# Patient Record
Sex: Male | Born: 1945 | Race: White | Hispanic: No | Marital: Married | Smoking: Never smoker
Health system: Southern US, Community
[De-identification: ages and names within clinical notes are randomized; demographics above are authoritative.]

## PROBLEM LIST (undated history)

## (undated) DIAGNOSIS — I10 Essential (primary) hypertension: Secondary | ICD-10-CM

## (undated) DIAGNOSIS — Z7901 Long term (current) use of anticoagulants: Secondary | ICD-10-CM

## (undated) DIAGNOSIS — I351 Nonrheumatic aortic (valve) insufficiency: Secondary | ICD-10-CM

## (undated) DIAGNOSIS — K56609 Unspecified intestinal obstruction, unspecified as to partial versus complete obstruction: Secondary | ICD-10-CM

## (undated) DIAGNOSIS — Z8719 Personal history of other diseases of the digestive system: Secondary | ICD-10-CM

## (undated) DIAGNOSIS — I482 Chronic atrial fibrillation, unspecified: Secondary | ICD-10-CM

## (undated) DIAGNOSIS — N400 Enlarged prostate without lower urinary tract symptoms: Secondary | ICD-10-CM

## (undated) DIAGNOSIS — E785 Hyperlipidemia, unspecified: Secondary | ICD-10-CM

## (undated) DIAGNOSIS — I5189 Other ill-defined heart diseases: Secondary | ICD-10-CM

## (undated) DIAGNOSIS — G9341 Metabolic encephalopathy: Secondary | ICD-10-CM

## (undated) DIAGNOSIS — A419 Sepsis, unspecified organism: Secondary | ICD-10-CM

## (undated) DIAGNOSIS — S86812A Strain of other muscle(s) and tendon(s) at lower leg level, left leg, initial encounter: Secondary | ICD-10-CM

## (undated) HISTORY — DX: Nonrheumatic aortic (valve) insufficiency: I35.1

## (undated) HISTORY — DX: Long term (current) use of anticoagulants: Z79.01

## (undated) HISTORY — DX: Hyperlipidemia, unspecified: E78.5

## (undated) HISTORY — DX: Chronic atrial fibrillation, unspecified: I48.20

## (undated) HISTORY — DX: Benign prostatic hyperplasia without lower urinary tract symptoms: N40.0

## (undated) HISTORY — DX: Other ill-defined heart diseases: I51.89

## (undated) HISTORY — DX: Essential (primary) hypertension: I10

## (undated) HISTORY — DX: Personal history of other diseases of the digestive system: Z87.19

---
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
## 2002-11-11 HISTORY — PX: SMALL INTESTINE SURGERY: SHX150

## 2002-11-11 HISTORY — PX: APPENDECTOMY: SHX54

## 2004-05-01 ENCOUNTER — Ambulatory Visit (HOSPITAL_COMMUNITY): Admission: RE | Admit: 2004-05-01 | Discharge: 2004-05-01 | Payer: Self-pay | Admitting: Cardiology

## 2004-05-01 HISTORY — PX: CARDIOVERSION: SHX1299

## 2006-01-20 ENCOUNTER — Encounter: Admission: RE | Admit: 2006-01-20 | Discharge: 2006-01-20 | Payer: Self-pay | Admitting: Internal Medicine

## 2007-09-30 HISTORY — PX: CARDIOVASCULAR STRESS TEST: SHX262

## 2008-09-09 ENCOUNTER — Ambulatory Visit: Payer: Self-pay | Admitting: Cardiology

## 2008-09-09 ENCOUNTER — Encounter: Admission: RE | Admit: 2008-09-09 | Discharge: 2008-09-09 | Payer: Self-pay | Admitting: Cardiology

## 2008-09-09 ENCOUNTER — Inpatient Hospital Stay (HOSPITAL_COMMUNITY): Admission: EM | Admit: 2008-09-09 | Discharge: 2008-09-19 | Payer: Self-pay | Admitting: Emergency Medicine

## 2008-09-10 ENCOUNTER — Encounter (INDEPENDENT_AMBULATORY_CARE_PROVIDER_SITE_OTHER): Payer: Self-pay | Admitting: General Surgery

## 2008-09-10 ENCOUNTER — Encounter (INDEPENDENT_AMBULATORY_CARE_PROVIDER_SITE_OTHER): Payer: Self-pay | Admitting: *Deleted

## 2008-09-16 ENCOUNTER — Encounter (INDEPENDENT_AMBULATORY_CARE_PROVIDER_SITE_OTHER): Payer: Self-pay | Admitting: *Deleted

## 2008-09-29 ENCOUNTER — Inpatient Hospital Stay (HOSPITAL_COMMUNITY): Admission: EM | Admit: 2008-09-29 | Discharge: 2008-10-08 | Payer: Self-pay | Admitting: Emergency Medicine

## 2008-09-29 ENCOUNTER — Encounter: Admission: RE | Admit: 2008-09-29 | Discharge: 2008-09-29 | Payer: Self-pay | Admitting: General Surgery

## 2008-09-29 ENCOUNTER — Encounter (INDEPENDENT_AMBULATORY_CARE_PROVIDER_SITE_OTHER): Payer: Self-pay | Admitting: *Deleted

## 2008-11-17 ENCOUNTER — Encounter (INDEPENDENT_AMBULATORY_CARE_PROVIDER_SITE_OTHER): Payer: Self-pay | Admitting: *Deleted

## 2009-08-11 DIAGNOSIS — I5189 Other ill-defined heart diseases: Secondary | ICD-10-CM

## 2009-08-11 HISTORY — DX: Other ill-defined heart diseases: I51.89

## 2009-09-05 ENCOUNTER — Encounter: Payer: Self-pay | Admitting: Gastroenterology

## 2009-09-05 HISTORY — PX: US ECHOCARDIOGRAPHY: HXRAD669

## 2009-10-11 ENCOUNTER — Ambulatory Visit: Payer: Self-pay | Admitting: Gastroenterology

## 2009-10-11 ENCOUNTER — Encounter (INDEPENDENT_AMBULATORY_CARE_PROVIDER_SITE_OTHER): Payer: Self-pay | Admitting: *Deleted

## 2009-10-11 DIAGNOSIS — K5730 Diverticulosis of large intestine without perforation or abscess without bleeding: Secondary | ICD-10-CM | POA: Insufficient documentation

## 2009-10-11 DIAGNOSIS — K219 Gastro-esophageal reflux disease without esophagitis: Secondary | ICD-10-CM | POA: Insufficient documentation

## 2009-11-28 ENCOUNTER — Encounter (INDEPENDENT_AMBULATORY_CARE_PROVIDER_SITE_OTHER): Payer: Self-pay | Admitting: *Deleted

## 2009-11-30 ENCOUNTER — Ambulatory Visit: Payer: Self-pay | Admitting: Gastroenterology

## 2010-01-02 ENCOUNTER — Telehealth: Payer: Self-pay | Admitting: Gastroenterology

## 2010-01-03 ENCOUNTER — Ambulatory Visit: Payer: Self-pay | Admitting: Gastroenterology

## 2010-09-24 ENCOUNTER — Ambulatory Visit: Payer: Self-pay | Admitting: Cardiology

## 2010-09-28 ENCOUNTER — Ambulatory Visit: Payer: Self-pay | Admitting: Cardiology

## 2010-10-10 ENCOUNTER — Ambulatory Visit: Payer: Self-pay | Admitting: Cardiology

## 2010-10-16 ENCOUNTER — Ambulatory Visit: Payer: Self-pay | Admitting: Cardiovascular Disease

## 2010-11-16 ENCOUNTER — Ambulatory Visit: Payer: Self-pay | Admitting: Cardiology

## 2010-12-11 NOTE — Letter (Signed)
Summary: [facility] Instructions  [facility]  [address] [place], [place] [address]   Phone: [phone]  Fax: [phone]       Russell Dillon    [birth year]    MRN: [id number]        Procedure Day Dorna Bloom:  Lenor Coffin 12/14/09     Arrival Time:  8:30AM     Procedure Time:  9:30AM     Location of Procedure:                    _ X_  [facility] (4th Floor)                        PREPARATION FOR COLONOSCOPY WITH MOVIPREP   Starting 5 days prior to your procedure 12/09/09 do not eat nuts, seeds, popcorn, corn, beans, peas,  salads, or any raw vegetables.  Do not take any fiber supplements (e.g. Metamucil, Citrucel, and Benefiber).  THE DAY BEFORE YOUR PROCEDURE         DATE: 12/13/09  DAY: WEDNESDAY  1.  Drink clear liquids the entire day-NO SOLID FOOD  2.  Do not drink anything colored red or purple.  Avoid juices with pulp.  No orange juice.  3.  Drink at least 64 oz. (8 glasses) of fluid/clear liquids during the day to prevent dehydration and help the prep work efficiently.  CLEAR LIQUIDS INCLUDE: Water Jello Ice Popsicles Tea (sugar ok, no milk/cream) Powdered fruit flavored drinks Coffee (sugar ok, no milk/cream) Gatorade Juice: apple, white grape, white cranberry  Lemonade Clear bullion, consomm, broth Carbonated beverages (any kind) Strained chicken noodle soup Hard Candy                             4.  In the morning, mix first dose of MoviPrep solution:    Empty 1 Pouch A and 1 Pouch B into the disposable container    Add lukewarm drinking water to the top line of the container. Mix to dissolve    Refrigerate (mixed solution should be used within 24 hrs)  5.  Begin drinking the prep at 5:00 p.m. The MoviPrep container is divided by 4 marks.   Every 15 minutes drink the solution down to the next mark (approximately 8 oz) until the full liter is complete.   6.  Follow completed prep with 16 oz of clear liquid of your choice  (Nothing red or purple).  Continue to drink clear liquids until bedtime.  7.  Before going to bed, mix second dose of MoviPrep solution:    Empty 1 Pouch A and 1 Pouch B into the disposable container    Add lukewarm drinking water to the top line of the container. Mix to dissolve    Refrigerate  THE DAY OF YOUR PROCEDURE      DATE: 12/14/09  DAY: THURSDAY  Beginning at 4:30AM (5 hours before procedure):         1. Every 15 minutes, drink the solution down to the next mark (approx 8 oz) until the full liter is complete.  2. Follow completed prep with 16 oz. of clear liquid of your choice.    3. You may drink clear liquids until 7:30AM (2 HOURS BEFORE PROCEDURE).   MEDICATION INSTRUCTIONS  Unless otherwise instructed, you should take regular prescription medications with a small sip of water   as early as possible the morning of  your procedure.         OTHER INSTRUCTIONS  You will need a responsible adult at least 67 years of age to accompany you and drive you home.   This person must remain in the waiting room during your procedure.  Wear loose fitting clothing that is easily removed.  Leave jewelry and other valuables at home.  However, you may wish to bring a book to read or  an iPod/MP3 player to listen to music as you wait for your procedure to start.  Remove all body piercing jewelry and leave at home.  Total time from sign-in until discharge is approximately 2-3 hours.  You should go home directly after your procedure and rest.  You can resume normal activities the  day after your procedure.  The day of your procedure you should not:   Drive   Make legal decisions   Operate machinery   Drink alcohol   Return to work  You will receive specific instructions about eating, activities and medications before you leave.    The above instructions have been reviewed and explained to me by   Clide Cliff, RN_____________________    I fully understand  and can verbalize these instructions _____________________________ Date _________

## 2010-12-11 NOTE — Progress Notes (Signed)
Summary: Moviprep  Phone Note Call from Patient Call back at Home Phone [phone]   Caller: Patient Call For: Dr. Russella Dar Reason for Call: Talk to Nurse Summary of Call: Pt is needing his Moviprep called in for his procedure tomorrow. [facility] and [facility]'t [facility]. Initial call taken by: Karna Christmas,  January 02, 2010 8:04 AM  Follow-up for Phone Call        Rx was sent to pts pharmacy.  Follow-up by: Christie Nottingham CMA Duncan Dull),  January 02, 2010 8:09 AM    New/Updated Medications: MOVIPREP 100 GM  SOLR (PEG-KCL-NACL-NASULF-NA ASC-C) As per prep instructions. Prescriptions: MOVIPREP 100 GM  SOLR (PEG-KCL-NACL-NASULF-NA ASC-C) As per prep instructions.  #1 x 0   Entered by:   Christie Nottingham CMA (AAMA)   Authorized by:   Meryl Dare MD [facility]   Signed by:   Christie Nottingham CMA (AAMA) on 01/02/2010   Method used:   [facility]  [address] [id number]       [address]       [place], [place]  [address]       Ph: [phone] or [phone]       Fax: [phone]   RxID:   [id number]

## 2010-12-11 NOTE — Miscellaneous (Signed)
Summary: V76.51,V58.61,V72.81--NO PCR  Clinical Lists Changes  Observations: Added new observation of ALLERGY REV: Done (11/30/2009 9:51)   Patient already has his script.

## 2010-12-11 NOTE — Procedures (Signed)
Summary: Colonoscopy  Patient: Russell Dillon Note: All result statuses are Final unless otherwise noted.  Tests: (1) Colonoscopy (COL)   COL Colonoscopy           DONE     [facility]     [address]     [place], [place]  [address]           COLONOSCOPY PROCEDURE REPORT           PATIENT:  [name], [name]  MR#:  [id number]     BIRTHDATE:  Apr 14, 1946, 63 yrs. old  GENDER:  male           ENDOSCOPIST:  Judie Petit T. Russella Dar, MD, [facility]     Referred by:  Kari Baars, M.D.           PROCEDURE DATE:  01/03/2010     PROCEDURE:  Average-risk screening colonoscopy G0121     ASA CLASS:  Class III     INDICATIONS:  1) Routine Risk Screening           MEDICATIONS:   Fentanyl 50 mcg IV, Versed 6 mg IV           DESCRIPTION OF PROCEDURE:   After the risks benefits and     alternatives of the procedure were thoroughly explained, informed     consent was obtained.  Digital rectal exam was performed and     revealed no abnormalities.   The LB PCF-H180AL [id number] endoscope     was introduced through the anus and advanced to the cecum, which     was identified by both the appendix and ileocecal valve, limited     by a tortuous colon, fair prep.  The quality of the prep was     Moviprep fair only after extensive rinsing and suctioning. The     instrument was then slowly withdrawn as the colon was fully     examined.     <<PROCEDUREIMAGES>>           FINDINGS:  A normal appearing cecum, ileocecal valve, and     appendiceal orifice were identified. The ascending, hepatic     flexure, transverse, splenic flexure, descending, sigmoid colon,     and rectum appeared unremarkable. Retroflexed views in the rectum     revealed internal hemorrhoids, small. The time to cecum =  7.33     minutes. The scope was then withdrawn (time =  15.5  min) from the     patient and the procedure completed.           COMPLICATIONS:  None           ENDOSCOPIC IMPRESSION:     1) Normal colon     2)  Fair bowel prep           RECOMMENDATIONS:     1) High fiber diet with liberal fluid intake.     2) Repeat Colonoscopy in 5 years for CRC screeding with a more     extensive bowel  prep.           Venita Lick. Russella Dar, MD, [facility]           n.     eSIGNED:   Venita Lick. Jamielyn Petrucci at 01/03/2010 02:19 PM           [name], [name], [id number]  Note: An exclamation mark (!) indicates a result that was not dispersed into the flowsheet. Document Creation Date: 01/03/2010 2:19 PM _______________________________________________________________________  (1)  Order result status: Final Collection or observation date-time: 01/03/2010 14:13 Requested date-time:  Receipt date-time:  Reported date-time:  Referring Physician:   Ordering Physician: Claudette Head [phone]) Specimen Source:  Source: Launa Grill Order Number: [id number] Lab site:   Appended Document: Colonoscopy    Clinical Lists Changes  Observations: Added new observation of COLONNXTDUE: 12/2014 (01/03/2010 15:33)      Appended Document: Colonoscopy     Procedures Next Due Date:    Colonoscopy: 12/2014

## 2010-12-13 ENCOUNTER — Encounter (INDEPENDENT_AMBULATORY_CARE_PROVIDER_SITE_OTHER): Payer: Medicare Other

## 2010-12-13 DIAGNOSIS — Z7901 Long term (current) use of anticoagulants: Secondary | ICD-10-CM

## 2011-01-10 ENCOUNTER — Other Ambulatory Visit (INDEPENDENT_AMBULATORY_CARE_PROVIDER_SITE_OTHER): Payer: Medicare Other

## 2011-01-10 DIAGNOSIS — I4891 Unspecified atrial fibrillation: Secondary | ICD-10-CM

## 2011-01-10 DIAGNOSIS — Z7901 Long term (current) use of anticoagulants: Secondary | ICD-10-CM

## 2011-01-22 ENCOUNTER — Encounter (INDEPENDENT_AMBULATORY_CARE_PROVIDER_SITE_OTHER): Payer: Medicare Other

## 2011-01-22 DIAGNOSIS — I4891 Unspecified atrial fibrillation: Secondary | ICD-10-CM

## 2011-01-22 DIAGNOSIS — Z7901 Long term (current) use of anticoagulants: Secondary | ICD-10-CM

## 2011-02-19 ENCOUNTER — Encounter: Payer: Medicare Other | Admitting: *Deleted

## 2011-02-25 ENCOUNTER — Other Ambulatory Visit: Payer: Self-pay | Admitting: Cardiology

## 2011-03-01 ENCOUNTER — Ambulatory Visit (INDEPENDENT_AMBULATORY_CARE_PROVIDER_SITE_OTHER): Payer: Medicare Other | Admitting: *Deleted

## 2011-03-01 DIAGNOSIS — I482 Chronic atrial fibrillation, unspecified: Secondary | ICD-10-CM | POA: Insufficient documentation

## 2011-03-01 DIAGNOSIS — I4891 Unspecified atrial fibrillation: Secondary | ICD-10-CM

## 2011-03-01 DIAGNOSIS — Z7901 Long term (current) use of anticoagulants: Secondary | ICD-10-CM | POA: Insufficient documentation

## 2011-03-01 LAB — POCT INR: INR: 2

## 2011-03-26 NOTE — H&P (Signed)
NAME:  Russell Dillon, Russell Dillon NO.:  [id number]   MEDICAL RECORD NO.:  [id number]          PATIENT TYPE:  INP   LOCATION:  5118                         FACILITY:  [facility]   PHYSICIAN:  Sharlet Salina T. Hoxworth, M.D.DATE OF BIRTH:  [birth year]   DATE OF ADMISSION:  09/09/2008  DATE OF DISCHARGE:                              HISTORY & PHYSICAL   PRIMARY CARE PHYSICIAN:  Deirdre Peer. Polite, MD   CARDIOLOGIST:  Colleen Can. Deborah Chalk, MD   TIME OF ADMISSION:  17:10 p.m.   CHIEF COMPLAINT:  Abdominal distention/abdominal pain.   HISTORY OF PRESENT ILLNESS:  Russell Dillon is a 65 year old white male  who has a history of hypertension, atrial fibrillation on chronic  Coumadin, BPH, and hypercholesterolemia with a history of congestive  heart failure, who began to have nausea with abdominal pain, and cough  approximately mid week last week.  At this time, he also has had some  associated diarrhea without any blood.  At this time, he visited his  primary care physician, who felt that he may have a GI virus and  informed him to drink lots of fluids and this would more than likely  pass.  The patient went home and over the next several days, this  persisted, and he followed up again with his primary care physician this  past Monday, who still felt this was a virus.  The patient went home and  at that time had a normal bowel movement once again without any blood or  any abnormalities.  At this time, the patient was having intermittent  just aggravating kind of a dull naggy pain.  Also, he was still having  some nausea.  The patient then states that he had another normal bowel  movement on Thursday.  However, since this pain was continuing to  persist, he felt due to his cardiac history that he was to see his  cardiologist to rule out the possibility of any cardiac complications.  Dr. Deborah Chalk was unavailable, so, he saw his assistant who set him for a  CT scan of the abdomen and pelvis.  The  CT scan showed a distal small  bowel obstruction with findings suspicious for pneumatosis of the small  bowel.  There is a transition point in the right lower quadrant.  At  this time, there was no free air seen.  At this time, after these  results, the patient was sent to [facility] Emergency Department where  at this time, we were consulted to see the patient.   REVIEW OF SYSTEMS:  Please see HPI.  Otherwise, the patient denies any  fevers, chills, headaches, chest pain, or shortness of breath.  He does  admit to abdominal pain with some nausea.  Otherwise, all other systems  are currently negative.   FAMILY HISTORY:  Noncontributory.   PAST MEDICAL HISTORY:  Significant for:  1. Atrial fibrillation, on chronic Coumadin.  2. Hypertension.  3. Hypercholesterolemia.  4. BPH.  5. History of CHF.   PAST SURGICAL HISTORY:  None.   SOCIAL HISTORY:  The patient is married.  He does have 1 daughter who  is  a Runner, broadcasting/film/video at [employer] [place].  Otherwise, he does not smoke or  drink any alcohol.   ALLERGIES:  NKDA.   MEDICATIONS:  Include, however, doses are not known:  1. Coumadin.  2. Flomax.  3. Lasix.  4. Cardizem.  5. Digitek.  6. Lipitor.   PHYSICAL EXAMINATION:  GENERAL:  Russell Dillon is a 65 year old white  male who is currently lying in bed in no acute distress and does not  appear ill.  VITAL SIGNS:  Temperature 97, pulse 104, respirations 20, and blood  pressure 101/65.  HEENT:  Eyes, sclerae noninjected.  Pupils equal, round, and reactive to  light.  Ears and nose are without any obvious rashes or lesions.  No  rhinorrhea.  Mouth is pink and moist.  Throat shows no exudate.  NECK:  Supple.  Trachea is midline.  No thyromegaly.  HEART:  Currently, the patient is in atrial fibrillation and is somewhat  tachycardic with a pulse rate of 104.  Otherwise, I did not note any  murmurs, gallops, or rubs.  He does have +2 bilateral carotid, radial,  and pedal pulses.   LUNGS:  Clear to auscultation bilaterally with no wheezes, rhonchi, or  rales noted.  Respiratory effort is nonlabored.  ABDOMEN:  Soft.  However, distended and somewhat tight when palpated  deeper.  Currently, he has decreased bowel sounds. However, he does not  seem tender with palpation.  Currently, he does not have any other  masses or hernias noted and no peritoneal signs.  MUSCULOSKELETAL:  All 4 extremities are symmetrical with no cyanosis,  clubbing, or edema.  SKIN:  Warm and dry.  NEUROLOGIC:  Cranial nerves II through XII appeared to be grossly  intact.  PSYCH:  The patient is awake, alert, and oriented x3 with an appropriate  affect.   LABORATORY DATA AND DIAGNOSTICS:  White blood cell count is 12,600,  hemoglobin 13.6, hematocrit 40.8, and platelet count 361,000.  INR is  3.6.  Sodium 129, potassium 3.6, glucose 129, BUN 27, creatinine 1.16.  LFTs are all normal.  CT scan is as dictated above, but shows distal  small bowel obstruction with transition point in the right lower  quadrant with findings suspicious for pneumatosis of the small bowel.  An EKG also shows the patient currently in atrial fibrillation.  An  acute abdominal series was also obtained.  It does have a chest x-ray  associated with it with no acute cardiopulmonary issues.   IMPRESSION:  1. Small bowel obstruction.  2. Questionable small bowel pneumatosis.  3. Atrial fibrillation.  4. Supratherapeutic INR.  5. Hypertension.  6. Hypercholesterolemia.  7. Benign prostatic hypertrophy.  8. History of congestive heart failure.   PLAN:  At this time, we will plan on admitting this patient, starting  him on Cipro, Flagyl, as well as various other p.r.n. medications as  needed for pain and nausea.  The patient will also be started on IV  fluids.  We will also get his type and cross and began transfusion of 2  units of FFP to correct the patient's INR.  Once this is complete, then  we will get a stat PT,  INR and result should be called to the on-call  physician.  At this time, it is concerning since the patient has no  surgical history that he is currently in the condition he is in, and  anticipate unless he resolved this problem overnight that he will  probably need surgical intervention  in the morning.  However, in the  meantime we will try to correct his INR and repeat labs in the morning  as well as the KUB early in the morning.  Otherwise, in the meantime we  will place an NG tube and place him on low intermittent wall suction to  help decompress his small bowel.  Also, I am going to hold all of his  home medicines as right now he will be n.p.o., and he is somewhat  hypotensive currently.  Therefore, I did not want to give him anymore of  his blood pressure medicines.  Otherwise, we will follow this patient  closely and recheck in the morning.      Letha Cape, PA      Lorne Skeens. Hoxworth, M.D.  Electronically Signed    KEO/MEDQ  D:  09/09/2008  T:  09/10/2008  Job:  [id number]   cc:   Colleen Can. Deborah Chalk, M.D.  Deirdre Peer. Polite, M.D.

## 2011-03-26 NOTE — H&P (Signed)
NAME:  Russell Dillon, Russell Dillon NO.:  [id number]   MEDICAL RECORD NO.:  [id number]          PATIENT TYPE:  INP   LOCATION:  5155                         FACILITY:  [facility]   PHYSICIAN:  Velora Heckler, MD      DATE OF BIRTH:  [birth year]   DATE OF ADMISSION:  09/29/2008  DATE OF DISCHARGE:                              HISTORY & PHYSICAL   PRIMARY SURGEON:  Sharlet Salina T. Hoxworth, MD   CARDIOLOGIST:  Colleen Can. Deborah Chalk, MD   CHIEF COMPLAINT:  Failure to thrive and small-bowel obstruction seen on  x-ray.   HISTORY OF PRESENT ILLNESS:  Mr. Gentile is a 62-year male patient  who prior to the last admission on September 09, 2008, had not had any  operative procedures to the abdominal region.  He was admitted at that  time secondary to bowel obstruction.  He was taken to the OR on September 10, 2008, where he underwent an exploratory laparotomy, lysis of  adhesions, and incidental appendectomy by Dr. Johna Sheriff.  The patient  remained in the hospital until September 17, 2008.  He was discharged home  and reports he did better initially but had persistent anorexia with  progressive decline thereafter.  He continued to have multiple BMs daily  up to 3-4 per day which were loose in nature.  He reports his last BM  was this morning, 2 very tiny ones.  He has had emesis x1 about 1 week  ago.  He is on Coumadin for atrial fibrillation and yesterday, his INR  was 7.0.  Cardiology is managing this.  They held his Coumadin and give  him a dose of oral vitamin K.  He has not had any problems related to  bleeding related to the elevated INR per his recollection.  He is not  having abdominal pain and currently feels stable other than the anorexia  and inability to eat.  He only ate few bites of grits and had some  orange juice this morning.  He presented to Dr. Carolynne Edouard this morning for  evaluation because of the anorexia.  Dr. Carolynne Edouard examined him and felt the  patient needs further diagnostic workup.   A plain abdominal film was  taken, 2 views, which showed air-fluid levels and a diffuse small bowel  obstruction.  He was subsequently sent to the ER for admission and lab  work is pending.   REVIEW OF SYSTEMS:  ENT:  Persistent laryngitis.  The patient attributes  this to repeated NG tube insertion attempts in emergency department.  He  relates this to onset of his meningitis.  Otherwise, review of system  categories are negative or noncontributory or as per the history of  present illness.  GI:  The patient did report to me that he had  continued to take Imodium because of his loose stool problem.   SOCIAL HISTORY:  The patient is married.  No alcohol.  No drugs.   FAMILY MEDICAL HISTORY:  Noncontributory.   PAST MEDICAL HISTORY:  1. Atrial fibrillation, on chronic Coumadin.  2. Hypertension.  3. Dyslipidemia.  4. BPH.  5. History of  CHF, ejection fraction unknown.   PAST SURGICAL HISTORY:  Exploratory laparotomy with lysis of adhesions  and incidental appendectomy on September 10, 2008, by Dr. Johna Sheriff.   ALLERGIES:  NKDA.   CURRENT MEDICATIONS:  At time of discharge are as follows:  1. Lasix 20 mg daily.  2. Finasteride 5 mg daily.  3. Lipitor 10 mg daily.  4. Digoxin 0.25 mg daily.  5. Warfarin as directed by Cardiology.  6. Cardizem 360 mg daily.  7. Vicodin 5/325 one to two tablets every 4 hours as needed for pain.  8. Omeprazole 20 mg daily while taking over-the-counter ibuprofen.  9. Phenergan 25 mg half to whole tablet every 4 hours as needed for      nausea.  10.Over-the-counter Imodium as needed for loose stools.   PHYSICAL EXAMINATION:  GENERAL:  Pale, chronically ill-appearing male  patient, fair looking, complaining of anorexia.  VITAL SIGNS:  Temperature 97.1, BP 103/69, pulse 84 and regular, and  respirations 16.  PSYCH:  The patient is alert and oriented x3.  Affect is appropriate for  current situation.  NEURO:  Cranial nerves II through XII are grossly  intact.  He is moving  all extremities x4.  No focal deficits.  EARS, NOSE, AND THROAT:  Ears are symmetrical.  No otorrhea.  Nose is  midline.  No rhinorrhea.  Throat is dry.  Oral mucous membranes are dry.  He has a hoarse quality to the tone of his voice.  CHEST:  Bilateral lung sounds are clear to auscultation.  Respiratory  effort is nonlabored.  He is saturating 84% on room air.  He is  nontachypneic and denies respiratory difficulty.  He does not appear to  be in respiratory distress.  CARDIOVASCULAR:  Heart sounds are S1 and S2 without obvious rubs,  murmurs, or gallops.  No JVD.  No peripheral edema,.  Current pulse is  regular and nontachycardic.  ABDOMEN:  Soft and slightly distended.  Bowel sounds are auscultated  mainly in left quadrant with left upper quadrant with echoic and rushing  sounds.  Abdomen is nontender.  Midline wound is open distally.  Dressing is in place.  This was removed and there is pale granular  tissue with serous drainage on the dressing.  EXTREMITIES:  Thin and symmetrical in appearance without cyanosis.   LABORATORY DATA:  CBC, CMP, urinalysis, PT/PTT, and lactic acid are all  pending.   DIAGNOSTICS:  An outpatient abdominal x-ray, 2 views, again just shows  diffuse dilated loops of small bowel consistent with an obstructive  process.   IMPRESSION:  1. Small-bowel obstruction in patient with recent exploratory      laparotomy and lysis of adhesions.  2. Atrial fibrillation, on Coumadin, now is supratherapeutic INR per      yesterday's results.  3. Mild respiratory failure in a patient with history of congestive      heart failure, rule out congestive heart failure exacerbation      versus pneumonia process.  4. Hypertension.  5. Dyslipidemia.  6. Benign prostatic hypertrophy.   PLAN:  1. Admit the patient, with 100 unit n.p.o. status for bowel rest, IV      fluids.  2. Follow up on labs.  We will place an NG tube once the INR is less       than or equal to 3.0.  3. If serum creatinine is okay, plan a CT of the abdomen and pelvis      with oral and IV contrast  once the NG tube is inserted and the      patient has been to low wall suction to decompress for 2 hours.  4. I will hold his home medications.  We will change his digoxin to IV      route and in lieu of p.o. Cardizem, we will use p.r.n. IV Lopressor      based on tachycardia.  Current systolic blood pressures in the      100s, so this does not need to be scheduled at this time.  He needs      hydration as well unless he is in heart failure.  In the interim,      we will begin his fluids at 150 an hour.  5. Check a chest x-ray and EKG.  Repeat pulse oximetry reading shows      95%.  We will also check a BNP.  6. Notify Cardiology of the patient's admission.      Allison L. Rennis Harding, N.P.      Velora Heckler, MD  Electronically Signed    ALE/MEDQ  D:  09/29/2008  T:  09/30/2008  Job:  [id number]   cc:   Colleen Can. Deborah Chalk, M.D.

## 2011-03-26 NOTE — Discharge Summary (Signed)
NAME:  Russell Dillon, Russell Dillon NO.:  [id number]   MEDICAL RECORD NO.:  [id number]          PATIENT TYPE:  INP   LOCATION:  2032                         FACILITY:  [facility]   PHYSICIAN:  Lennie Muckle, MD      DATE OF BIRTH:  03-Jan-1946   DATE OF ADMISSION:  09/09/2008  DATE OF DISCHARGE:                               DISCHARGE SUMMARY   ADDENDUM   On Friday 6, 2009, which was postoperative day 6, Russell Dillon was  doing well without any complaints.  His diet was advanced to regular  diet.  Also on this day, he had approximately 3 large bowel movements.  He had the anticipation that he will be discharged the next day; however  on the following day, he began to have approximately 7-8 diarrhea-type  bowel movements.  At this time, there was a concern that if the patient  was sent home he would end up being dehydrated.  Therefore at this time,  he was kept for additional couple of days and started on Imodium.  By  postoperative day 9, the patient's stools were improving, and he only  had approximately 3 stools on this day; however, they were still watery.  However, it was felt that his stools were better controlled with  Imodium, and therefore, he could be sent home with the Imodium.   Also on postoperative day 6, the patient began to have some serous  leakage out of his inferior aspect of his wound and this was opened up  slightly on postoperative day 7 and packing was started.  Therefore, at  this time home health was consulted to help the patient and his family  with dressing changes at home upon discharge.  Also on postoperative day  9 before the patient was discharged, his incision staples were  discontinued and Steri-Strips were applied.   DISCHARGE MEDICATIONS:  1. Frusemide 20 mg daily.  2. Finasteride 5 mg daily.  3. Lipitor 10 mg daily.  4. Digoxin 0.25 mg daily.  5. Warfarin half tab, 2 days a week.  6. Warfarin one tab 5 days a week.  7. Cardizem 360 mg p.o.  daily.  8. Vicodin 5/325 one-two tablets q.4 h. p.r.n. for pain.  9. Omeprazole 20 mg p.o. daily while taking over-the-counter      Ibuprofen.  10.Phenergan 25 mg half to whole tablet p.o. q.4 h. p.r.n. nausea.  11.Imodium over-the-counter as needed for loose stools.   DISCHARGE INSTRUCTIONS:  Discharge instructions are essentially the same  as prior to discharge summary; however, he is now informed that he no  longer needs to follow up with [facility] on September 20, 2008, for  staple removal.  He is now told to follow up with Dr. Johna Sheriff in  approximately 2 weeks for postoperative followup, and they are to call  to schedule his appointment.  Also, he is informed that they will need  to begin doing wound dressing changes.  He does have home health set up  for this, and they will help instruct the wife on how to do these  dressing changes, so that she  can eventually be able to use herself.  Also currently, the patient is complaining of not having a tremendous  appetite so he is informed to take some kind of supplemental shake such  as the [employer] or Ensure 3 times a day until his  appetite becomes a little more normal.  Also of note, the patient is  complaining of being hoarse, which started after his NG tube was  discontinued.  Per Dr. Freida Busman if the  patient is still having problems with this at his followup in  approximately 2 weeks, the patient may need an ENT consult.  Just a side  note, when the patient had his NG tube inserted, it took well over 6 or  7 trials, and I am not sure if this is contributing to some of his  hoarseness.      Letha Cape, PA      Lennie Muckle, MD  Electronically Signed    KEO/MEDQ  D:  09/19/2008  T:  09/19/2008  Job:  [id number]   cc:   Deirdre Peer. Polite, M.D.  Colleen Can. Deborah Chalk, M.D.  Lorne Skeens. Hoxworth, M.D.

## 2011-03-26 NOTE — Discharge Summary (Signed)
NAME:  Russell Dillon, Russell Dillon NO.:  [id number]   MEDICAL RECORD NO.:  [id number]          PATIENT TYPE:  INP   LOCATION:  2032                         FACILITY:  [facility]   PHYSICIAN:  Ollen Gross. Vernell Morgans, M.D. DATE OF BIRTH:  [birth year]   DATE OF ADMISSION:  09/09/2008  DATE OF DISCHARGE:  09/17/2008                               DISCHARGE SUMMARY   ADMITTING PHYSICIAN:  Sharlet Salina T. Hoxworth, MD   Date of anticipated discharge, September 17, 2008.  Anticipated discharge  physician will either be Dr. Bertram Savin or Dr. Cyndia Bent.   CONSULTATIONS:  Colleen Can. Deborah Chalk, MD with Cardiology.   PROCEDURES:  Laparotomy with lysis of adhesions for small bowel  obstruction with incidental appendectomy by Dr. Johna Sheriff on September 10, 2008.   REASON FOR ADMISSION:  Russell Dillon is a 65 year old white male with a  history of hypertension, paroxysmal atrial fibrillation, BPH, and  hypercholesterolemia, who presented to the [facility] Emergency  Department with nausea and abdominal pain for approximately a week and a  half.  It was felt that he had a gastrointestinal virus.  However, as  his pain continued to persist, eventually he presented to the emergency  department.  At this time, he was found by CT scan to have a distal  small bowel obstruction with findings suspicious for pneumatosis of the  small bowel.  At this time, the patient was admitted.  Please see  admitting history and physical for further details.   ADMITTING DIAGNOSES:  1. Small bowel obstruction.  2. Questionable small bowel pneumatosis.  3. Paroxysmal atrial fibrillation.  4. Supratherapeutic INR.  5. Hypertension.  6. Hypercholesterolemia.  7. Benign prostatic hypertrophy.  8. History of congestive heart failure.   HOSPITAL COURSE:  The patient was admitted to the hospital and started  on Cipro and Flagyl as well as given 2 units of FFP to correct the  patient's INR, which was supratherapeutic at 3.6.   An NG tube was also  placed to help decompress the patient's small bowel.  At this time, he  was allowed to rest overnight.  On the following morning, the patient  has had over 2000 mL out of his NG tube.  The patient was feeling  better, however, his KUB still showed a small bowel obstruction.  At  this time, his INR was 2.5 and therefore 2 more units of FFP were given.  Later in the day, the patient was taken to the operating room, where  laparotomy with lysis of adhesions and an incidental appendectomy were  performed by Dr. Johna Sheriff.  The patient tolerated this procedure well.  However, he was sent to a step-down unit due to his atrial fibrillation.  At this time, a Cardiology consult was obtained, in which the patient  was started on diltiazem as well as Cardizem, I believe.  Over the next  several days, the patient began to improve.  He did not have much pain.  However, he did develop a postoperative ileus.  By postoperative day #2,  the patient was stable enough for transfer out of the step-down unit to  a regular telemetry floor.  By postoperative day #4, the patient began  to pass flatus, and at this time, his NG tube was discontinued.  At this  time, his diet was advanced, and at the time of dictation, which is  postoperative day #6, he was tolerating a full liquid diet, and at this  time, his diet was advanced to a regular diet with anticipation of  discharge home in the morning, September 17, 2008.   Also in the hospital, the patient did come in in atrial fibrillation  and, as stated earlier, Cardiology was consulted.  At this time, he was  started on diltiazem, and for rate control as needed he was placed on  Cardizem.  The patient was stable, in atrial fibrillation for most of  the hospital stay until postoperative day #4, he converted back to  normal sinus rhythm.  Also on postoperative #4, the patient was found to  have tremendously supratherapeutic INR of 7.0.  At this  time, his  Lovenox was held, and over the next several days this began to fall, and  by postoperative #6, the patient's INR was back down to 1.4.   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  An x-ray was obtained to make sure  that this was all that was going on.  Physical Therapy was consulted to  help with the patient's range of motion as well as strength with  walking.  By the time of discharge, the patient is able to ambulate  without a walker as well as walk up and down steps and have almost full  range of motion in his right knee.   DISCHARGE DIAGNOSES:  1. Small bowel obstruction secondary to adhesions.  2. Status post laparotomy with lysis of adhesions and incidental      appendectomy.  3. Paroxysmal atrial fibrillation.  4. Supratherapeutic INR, which has been corrected.  5. Hypertension.  6. Hypercholesterolemia.  7. Benign prostatic hypertrophy.  8. History of congestive heart failure.   DISCHARGE MEDICATIONS:  He may resume all home medications, which  include:  1. Furosemide 20 mg daily.  2. Finasteride 5 mg daily.  3. Lipitor 10 mg daily.  4. Cardizem 360 mg daily.  5. Digoxin 0.25 mg daily.  6. Warfarin half a tab 2 days.  7. Warfarin 1 tablet for the other 5 days.  8. He was also given a prescription for Vicodin 5/325 one to two      tablets q.4 h. p.r.n. pain.   DISCHARGE INSTRUCTIONS:  Russell Dillon is informed that he does not  have any dietary restrictions.  He may increase his activity slowly and  he may walk up steps.  He was also informed that he may shower, however  he is not to bathe for the next 2-3 weeks.  He was also not to do any  heavy lifting greater than 10 or 15 pounds for the next 6 weeks.  He may  begin driving after 1 to 2 weeks or no longer using any narcotic pain  medicines.  Also of note, on postoperative day #6, the patient began to  have some serous fluid drainage out of his  incision, and the patient is  informed that he may use gauze to cover this up so that he does not have  any leakage of his clothes.  Otherwise, he is to call our office for  worsening abdominal pain, pus-like drainage from his incision, or  fever  greater than 101.5.  He is to also follow up in the [facility] on  September 20, 2008, at 3:15 for staple removal.  After that, he will then  follow up with Dr. Johna Sheriff within the next 2-3 weeks.  Otherwise, he is  to return to see Dr.  Deborah Chalk at his office early next week for lab work to check the  patient's PT and INR.  As stated earlier, this is a discharge summary  for Mr. Tousley with the anticipation of his discharge being  tomorrow.  If anything changes, an addendum will be made at that time.      Letha Cape, PA      Ollen Gross. Vernell Morgans, M.D.  Electronically Signed    KEO/MEDQ  D:  09/16/2008  T:  09/16/2008  Job:  [id number]   cc:   Colleen Can. Deborah Chalk, M.D.  Deirdre Peer. Polite, M.D.

## 2011-03-26 NOTE — Op Note (Signed)
NAME:  Russell Dillon, Russell Dillon NO.:  [id number]   MEDICAL RECORD NO.:  [id number]          PATIENT TYPE:  INP   LOCATION:  2550                         FACILITY:  [facility]   PHYSICIAN:  Sharlet Salina T. Hoxworth, M.D.DATE OF BIRTH:  1946/04/28   DATE OF PROCEDURE:  09/10/2008  DATE OF DISCHARGE:                               OPERATIVE REPORT   PREOPERATIVE DIAGNOSIS:  Small bowel obstruction.   POSTOPERATIVE DIAGNOSIS:  Small bowel obstruction secondary to multiple  adhesions.   SURGICAL PROCEDURES:  Laparotomy and lysis of adhesions for small bowel  obstruction with incidental appendectomy.   SURGEON:  Lorne Skeens. Hoxworth, MD   ASSISTANT:  Almond Lint, MD   ANESTHESIA:  General.   BRIEF HISTORY:  Russell Dillon is a 65 year old male with no previous  history of abdominal problems or surgery who presents with 1 week of  abdominal distention, nausea, vomiting, and pain.  He has had a CT scan  showing a small bowel obstruction with markedly dilated loops of  proximal small bowel and decompressed distal small bowel as well as what  appears to be possibly pneumatosis in the wall of the small bowel.  He  has been admitted, rehydrated, and NG placed.  He was on Coumadin for  chronic atrial fibrillation, which has been reversed and followup KUB  today shows persistent markedly dilated loops of small bowel.  The CT  scan also suggested some swirling of the distal small bowel mesentery  possibly internal hernia or volvulus.  With these findings, I have  recommended proceeding with emergency laparotomy for diagnosis and  treatment.  The nature of procedure, indications, risks of bleeding,  infection, bowel injury, and cardiorespiratory complications were  discussed with the patient's family.  He was now brought to operating  room for this procedure.   DESCRIPTION OF OPERATION:  The patient was brought to the operating  room, placed in supine position on the operating table, and  general  orotracheal anesthesia was induced.  He was already on broad-spectrum  antibiotics.  The abdomen was widely sterilely prepped and draped.  Foley catheter was placed.  Correct patient and procedure were verified.  PAS were in place.  A midline incision in the lower abdomen skirting up  above the umbilicus was used and the incision carried down through the  midline fascia and the peritoneum entered under direct vision.  There  was a moderate evidence of straw-colored ascitic fluid consistent with  obstruction and this was suctioned.  There were noted to be multiple  dilated loops of proximal small bowel.  They were otherwise healthy and  viable.  We followed this proximal small bowel distally and at about the  mid ileum, it became very tethered toward the retroperitoneum and to the  base of the mesentery.  These appeared to be chronic fairly severe  adhesions, not unlike what would be expected in a postoperative state.  The more terminal distal ileum was then again free, but still dilated  and then there were again marked adhesions and tethering posteriorly the  bowels toward the terminal ileum and the cecum.  These areas clearly  appeared to be responsible for the obstruction.  We proceeded with a  fairly extensive adhesiolysis.  Many of these adhesions appeared to be  essentially folds of the mesenteric peritoneum down upon itself and onto  the bowel and these were carefully lysed with the cautery and sharp  dissection until the initially mid ileum was completely freed to its  mobile mesentery, and we went down and completely freed the terminal  ileum, which was quite knotted down around the right lower quadrant and  the last foot or so of the terminal ileum was completely decompressed  beyond the adhesions.  The terminal was completely freed up down the  ileocecal valve, which was clearly identified.  The cecum mobilized up  in the cecum and right colon appeared normal.  There  was some fluid and  gas in the colon consistent with a high-grade partial small bowel  obstruction.  The appendix was somewhat rudimentarily small structure,  which was adherent over to the mesentery and the terminal ileum creating  a potential site for internal hernia, although I do not believe this was  involved in the original process.  However, because of this, we went  ahead and did an incidental appendectomy inverting the stump with 2-0  silk.  The small bowel was then carefully run from the ileocecal valve  back to the ligament of Treitz and was completely free on mobile  mesentery.  Noted on the small bowel areas that looked like creeping fat  as one might see with Crohn's disease with separate areas of mesenteric  fat extending up over the sides toward the antimesenteric border of the  small bowel.  However, the small bowel itself did not show any signs of  thickening or inflammation as one would expect with Crohn's disease.  The markedly dilated small bowel contents were then milked proximally  back and easily passed into the stomach and decompressed via the NG tube  and a large amount of air and about 1200 mL of fluid was decompressed  with very nice decompression of the small bowel.  The abdomen was then  copiously irrigated with warm saline.  The bowel again inspected.  There  was certainly no evidence of bowel injury and certainly no evidence of  any bowel ischemia or damage from the obstruction.  The sigmoid colon  was noted to be redundant and had some peritoneal attachments over to  actually the right anterior abdominal wall, but this again was not  involved in any way in the small bowel obstruction.  This was left  intact.  After thorough irrigation and assurance of hemostasis, this was  returned anatomic position.  A piece of Seprafilm was left over the  small bowel loops under the wound as it was minimal omentum.  The  midline fascia was then closed with running #1 PDS  began either end of  the incision and tied centrally.  The subcutaneous tissue was irrigated.  Skin closed with staples.  Sponge and needle counts were correct.  The  patient was taken to recovery in stable condition, although he did have  some SVT controlled with Cardizem and labetalol during the case.      Lorne Skeens. Hoxworth, M.D.  Electronically Signed     BTH/MEDQ  D:  09/10/2008  T:  09/11/2008  Job:  [id number]

## 2011-03-26 NOTE — Consult Note (Signed)
NAME:  ABDIMALIK, Russell Dillon NO.:  [id number]   MEDICAL RECORD NO.:  [id number]          PATIENT TYPE:  INP   LOCATION:  5155                         FACILITY:  [facility]   PHYSICIAN:  Zola Button T. Lazarus Dillon, M.D. DATE OF BIRTH:  09-20-1946   DATE OF CONSULTATION:  09/30/2008  DATE OF DISCHARGE:                                 CONSULTATION   CHIEF COMPLAINT:  Hoarseness.   HISTORY:  A 65 year old white male hospitalized 3 weeks ago with small  bowel obstruction and discovered at exploratory laparotomy to have  adhesions, etiology unknown.  Upon entrance into the emergency room, he  had 5 or 6 attempts at placing a nasogastric tube causing pain and nasal  bleeding.  Finally on the floor, one of the nurses was successful in  getting an NG tube placed.  This was in place for several days.  During  that interval, he had endotracheal anesthesia for surgery, but the tube  was removed upon completion of surgery and he was not on the ventilator  except during surgery.  Within 1 or 2 days after removal of the  nasogastric tube, his voice became hoarse to the point of aphonia and  has remained this way ever since.  It has not fluctuated.  No breathing  difficulty.  No swallowing difficulty or obvious aspiration.  No pain.  No cough.  He is back in the hospital now with another episode of small  bowel obstruction, presumed related to adhesions and maybe on deck for  surgery on Monday in 3 days.  The patient has no known neurologic  issues.  He does have atrial fibrillation, which is apparently rate  controlled.   PHYSICAL EXAMINATION:  GENERAL:  This is a somewhat gaunt-appearing  middle-aged white male in no obvious distress.  Mental status seems  sharp and he is reasonably animated.  HEENT:  The head is atraumatic and neck supple.  Cranial nerves grossly  intact.  His voice is completely aphonic and he is unable to make a  noise even with shouting.  The cough is more, but below and  relatively  nonfunctional.  He is not attempting to clear his throat.  Ear canals  are clear with normal aerated drums.  Anterior nose is slightly  corrugated and narrow on both sides.  Oral cavity is moist with teeth in  good repair.  Oropharynx shows a slightly long soft palate with 1+  residual tonsils.  NECK:  Without adenopathy.  No palpable thyroid gland including masses.   Following 2% viscous Xylocaine to the nose, the flexible laryngoscope  was introduced through the left side.  Nasopharynx is clear with normal  eustachian tori.  Oropharynx is clear with normal base of tongue.  Hypopharynx reveals a fully mobile and normal right vocal cord.  The  left arytenoid looks to be in a normal position and queer slightly, but  does not abduct or adduct.  The vocal cord appears to be in a paramedian  position, but is heavily bowed especially immediately in front of the  vocal process of the arytenoid.  No obvious scar tissue.  The  airway is  good, but he is unable to reapproximate the cords to phonate.  No  pooling, valleculae or pyriforms.   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  I am not sure, I think this could be  posttraumatic including difficult NG tube placement.  The severity of  the bowing almost suggests a soft tissue deficit such as could occur  with scarring, but I do not why this would have occurred only several  days after discontinuation of the NG tube.   PLAN:  I would like to get a CT scan with contrast from the base of  skull down to the mediastinum looking for any lesion along the course of  the recurrent laryngeal nerve.  I will discuss this with the patient and  his family.  Absence in the etiology, I am tempted to do a Gelfoam  injection under anesthesia, perhaps coordinated with the general  surgeons.  The ability to inject this material may suggest whether this  is truly softer bowing related to  denervation, atrophy or whether this  is actual scar tissue.  The Gelfoam would be admittedly temporary and  depending on his  results, may be appropriate to consider for a calcium hydroxyapatite  injection later, or possibly even a vocal cord medialization thyroplasty  from an external approach.  In almost any case, I think there will be  some value in having speech pathology assist.      Russell Dillon, M.D.  Electronically Signed     KTW/MEDQ  D:  09/30/2008  T:  10/01/2008  Job:  [id number]   cc:   Lorne Skeens. Hoxworth, M.D.  Velora Heckler, MD

## 2011-03-26 NOTE — Consult Note (Signed)
NAME:  COSME, JACOB NO.:  [id number]   MEDICAL RECORD NO.:  [id number]          PATIENT TYPE:  INP   LOCATION:  3312                         FACILITY:  [facility]   PHYSICIAN:  Marca Ancona, MD      DATE OF BIRTH:  04-Oct-1946   DATE OF CONSULTATION:  09/10/2008  DATE OF DISCHARGE:                                 CONSULTATION   PRIMARY CARDIOLOGIST:  Colleen Can. Deborah Chalk, MD   HISTORY OF PRESENT ILLNESS:  This is a 65 year old with a history of  hypertension and paroxysmal atrial fibrillation who presented on September 09, 2008, with nausea and abdominal pain.  The patient has had  approximately 10-day history of nausea and abdominal pain with some  diarrhea prior to admission.  He saw his primary care physician on  Monday who thought he had gastroenteritis.  He continued to have  abdominal pain.  He only had 2 bowel movements in the last 4 to 5 days  with a last normal bowel movement being on Thursday.  He was seen in Dr.  Ronnald Nian office and set up for a CT of his abdomen.  The CT showed a  distal small bowel obstruction.  The patient did go for an exploratory  laparotomy today, which showed small bowel adhesions.  He did have lysis  of adhesions.  Per the patient, he has paroxysmal atrial fibrillation,  and he is mostly in sinus rhythm.  However, his admission EKG showed  atrial fibrillation.  In the operating room today, his heart rate went  up to the 140s with what looked like atrial flutter with 2:1 block.  Cardiology is consulted for rate control.   HOME MEDICATIONS:  1. Coumadin.  2. Flomax.  3. Lasix 20 mg daily.  4. Diltiazem CD 360 mg daily.  5. Digoxin 0.125 mg daily.  6. Lipitor 10 mg daily.   CURRENT MEDICATIONS IN THE HOSPITAL:  1. Cipro 100 mg IV b.i.d.  2. Metronidazole 500 mg IV t.i.d.  3. Morphine by PCA.   PAST MEDICAL HISTORY:  1. Atrial fibrillation.  This appears to be paroxysmal.  He did have a      failed cardioversion back in 2005  but apparently he stays in normal      sinus rhythm most of the time per his own report.  The patient is      on chronic Coumadin.  2. Hypertension.  3. Hypercholesterolemia.  4. BPH.  5. History of diastolic congestive heart failure in the setting of      atrial fibrillation with rapid ventricular response.  6. History of normal adenosine Cardiolite in 2005.   SOCIAL HISTORY:  The patient is married.  He has 1 daughter.  He does  not smoke, does not drink alcohol, or does not use illicit drugs.   LABORATORY DATA:  Today, INR 2.1, white count 10.6, hematocrit 35,  platelets 349, sodium 135, potassium 3.1, chloride 96, bicarb 28, BUN  23, creatinine 1.01, and glucose 107.  LFTs normal.  CT of the abdomen  and pelvis showed distal small bowel obstruction.  There was a small  amount of free pelvic fluid.   FAMILY HISTORY:  Mother had a myocardial infarction at the age of 38.   REVIEW OF SYSTEMS:  Negative except as noted in the history of present  illness.   PHYSICAL EXAMINATION:  VITAL SIGNS:  The patient is afebrile, 95% on  room air, blood pressure 126/63, heart rate currently is in the 110s and  irregular.  GENERAL:  This is a well-developed male.  He is drowsy postoperatively.  NEUROLOGICAL:  He is drowsy due to medication.  However, he is oriented.  Normal affect.  NECK:  No thyromegaly or thyroid nodule.  JVP 8 cm.  LUNGS:  Clear to auscultation bilaterally.  Normal respiratory effort.  CARDIOVASCULAR:  Heart irregular.  S1 and S2.  No S3.  No S4.  No  murmur.  There is no peripheral edema.  There is no carotid bruit.  ABDOMEN:  The patient is status post expiratory laparotomy.  There is  mild distention.  EXTREMITIES:  No clubbing or cyanosis.  HEENT:  Normal exam.  MUSCULOSKELETAL:  Normal exam.  SKIN:  Normal exam.   EKG at admission showed atrial fibrillation at rate of 107.  Telemetry  from the operating room shows atrial flutter in the 140s with 2:1 block.   Currently, he is in atrial flutter to 110 with variable block.   ASSESSMENT AND PLAN:  This is a 65 year old with a history of paroxysmal  atrial fibrillation.  He was admitted for small bowel obstruction.  He  is now status post an exploratory laparotomy with lysis of adhesions.  The patient was in atrial fibrillation at admission and then was noted  to have atrial flutter with 2:1 block and a heart rate in the 140s in  the operating room.  Cardiology was asked to evaluate.  1. Atrial fibrillation/flutter.  The patient is currently in atrial      flutter in the 110s after IV diltiazem boluses.  We will start him      on diltiazem drip which we will titrate from 5 to 15 mg an hour      with goal heart rate less than 90.  We will replete his potassium      which is low at 3.1.  When he is taking p.o., we will resume his      digoxin, and we will resume his Coumadin when he is okay per      Surgery.  He has no history of stroke, so I do not believe there is      a need for a heparin bridge.  2. Small bowel obstruction.  This is secondary to adhesions.  He has      had no prior history of surgery, so I am not sure what the cause of      his adhesions are.      Marca Ancona, MD  Electronically Signed     DM/MEDQ  D:  09/10/2008  T:  09/11/2008  Job:  [id number]

## 2011-03-29 ENCOUNTER — Encounter: Payer: Medicare Other | Admitting: *Deleted

## 2011-03-29 ENCOUNTER — Ambulatory Visit (INDEPENDENT_AMBULATORY_CARE_PROVIDER_SITE_OTHER): Payer: Medicare Other | Admitting: *Deleted

## 2011-03-29 DIAGNOSIS — Z7901 Long term (current) use of anticoagulants: Secondary | ICD-10-CM

## 2011-03-29 DIAGNOSIS — I4891 Unspecified atrial fibrillation: Secondary | ICD-10-CM

## 2011-03-29 LAB — POCT INR: INR: 2.3

## 2011-03-29 NOTE — Cardiovascular Report (Signed)
NAME:  Russell Dillon, WORD NO.:  [id number]   MEDICAL RECORD NO.:  [id number]                   PATIENT TYPE:  OIB   LOCATION:  2899                                 FACILITY:  [facility]   PHYSICIAN:  Colleen Can. Deborah Chalk, M.D.            DATE OF BIRTH:  09/06/1946   DATE OF PROCEDURE:  05/01/2004  DATE OF DISCHARGE:  05/01/2004                              CARDIAC CATHETERIZATION   PROCEDURE:  Cardioversion.   CARDIOLOGIST:  Colleen Can. Deborah Chalk, M.D.   DESCRIPTION OF PROCEDURE:  Using anterior posterior pads, the patient  received two separate shocks of 200 joules each of biphasic energy.  The  first failed to convert him to sinus rhythm.  The second shock did convert  him to sinus rhythm.  However, before he was entirely awake he had  recurrence of atrial fibrillation.   He received 375 mg of IV Pentothal by Dr. Sheldon Silvan.   OVERALL IMPRESSION:  Initial successful cardioversion but with subsequent  reversion to atrial fibrillation.                                               Colleen Can. Deborah Chalk, M.D.    SNT/MEDQ  D:  05/01/2004  T:  05/02/2004  Job:  [id number]   cc:   Ike Bene, M.D.  [address]  [place]  [place] [address]  Fax: [phone]
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
## 2011-03-29 NOTE — Discharge Summary (Signed)
NAME:  Russell Dillon, Russell Dillon NO.:  [id number]   MEDICAL RECORD NO.:  [id number]          PATIENT TYPE:  INP   LOCATION:  5155                         FACILITY:  [facility]   PHYSICIAN:  Gabrielle Dare. Janee Morn, M.D.DATE OF BIRTH:  19-Dec-1945   DATE OF ADMISSION:  09/29/2008  DATE OF DISCHARGE:  10/08/2008                               DISCHARGE SUMMARY   CHIEF COMPLAINT:  Small bowel obstruction.   HISTORY OF PRESENT ILLNESS:  Russell Dillon is a 65 year old patient who  was admitted in October 2009 for bowel obstruction.  He underwent lysis  of adhesions on September 10, 2008, with incidental appendectomy.  He was  discharged home, but developed persistent anorexia and progressive  decline, so he was admitted back to the hospital with recurrent bowel  obstruction on September 29, 2008.   HOSPITAL COURSE:  The patient was treated with NG tube and bowel rest.  Nutrition was given with TNA.  He remained hemodynamically stable.  He  was having significant hoarseness and he was seen by Dr. 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  CT scan was checked by Dr.  Lazarus Salines.  He felt he was okay to discharge without further intervention.  His postoperative bowel obstruction gradually resolved.  He takes  Coumadin at home that was held until it was determined that he would not  be needing further surgery on his abdomen.  His Coumadin was restarted.  He continued having bowel movements and tolerating p.o. since TNA was  stopped.  He was covered with Lovenox as well as Coumadin was restarted  and he achieved the goal on his Coumadin for discharge, and he was  discharged on October 08, 2008, with complete resumption of bowel  function.  His INR was 1.8.  He was discharged in stable condition.  Followup is with Dr. Deborah Chalk to check his INR.  He will also follow up  with Dr. Johna Sheriff in 2 weeks and Dr. Lazarus Salines from ENT for his vocal cord  problem in 2-3 weeks.   DISCHARGE DIET:   Cardiac.   DISCHARGE MEDICATIONS:  1. Finasteride 5 mg daily.  2. Lipitor 10 mg daily.  3. Digoxin 0.25 mg daily.  4. Coumadin alternating 2.5 mg and 5 mg with further management by Dr.      Deborah Chalk.  5. He is also to Cardizem 360 mg daily.  6. Phenergan 25 mg p.r.n.  7. In addition, he is to take MiraLax over the counter daily as      needed.  8. Tylenol as needed for pain.      Gabrielle Dare Janee Morn, M.D.  Electronically Signed     BET/MEDQ  D:  11/17/2008  T:  11/18/2008  Job:  [id number]   cc:   Lorne Skeens. Hoxworth, M.D.  Colleen Can. Deborah Chalk, M.D.  Gloris Manchester. Lazarus Salines, M.D.

## 2011-04-12 ENCOUNTER — Telehealth: Payer: Self-pay | Admitting: Cardiology

## 2011-04-12 NOTE — Telephone Encounter (Signed)
Called regarding his voicemail that he left earlier today (next Dr. referral and scheduling an appointment). Please call back.

## 2011-04-15 NOTE — Telephone Encounter (Signed)
Pt requesting to reschedule appointment for 04/25/11.  RN rescheduled appointment with pt for 06/03/11 at 8 am with Norma Fredrickson NP.

## 2011-04-19 ENCOUNTER — Telehealth: Payer: Self-pay | Admitting: Cardiology

## 2011-04-19 NOTE — Telephone Encounter (Signed)
Pt wanted to talk to you He said he found a tick and removed it but now he has a rash please call

## 2011-04-19 NOTE — Telephone Encounter (Signed)
Found a tick recently and now has developed a rash.  RN instructed pt to call PCP Dr. Clelia Croft for recommendations.  Pt will call Dr. Clelia Croft today.

## 2011-04-25 ENCOUNTER — Ambulatory Visit (INDEPENDENT_AMBULATORY_CARE_PROVIDER_SITE_OTHER): Payer: Medicare Other | Admitting: *Deleted

## 2011-04-25 ENCOUNTER — Ambulatory Visit: Payer: Medicare Other | Admitting: Cardiology

## 2011-04-25 DIAGNOSIS — I4891 Unspecified atrial fibrillation: Secondary | ICD-10-CM

## 2011-04-25 DIAGNOSIS — Z7901 Long term (current) use of anticoagulants: Secondary | ICD-10-CM

## 2011-04-25 LAB — POCT INR: INR: 2.3

## 2011-04-26 ENCOUNTER — Encounter: Payer: Medicare Other | Admitting: *Deleted

## 2011-05-30 ENCOUNTER — Encounter: Payer: Self-pay | Admitting: Nurse Practitioner

## 2011-06-03 ENCOUNTER — Ambulatory Visit: Payer: Medicare Other | Admitting: Nurse Practitioner

## 2011-06-03 ENCOUNTER — Encounter: Payer: Medicare Other | Admitting: *Deleted

## 2011-06-04 ENCOUNTER — Encounter: Payer: Self-pay | Admitting: Nurse Practitioner

## 2011-06-04 ENCOUNTER — Ambulatory Visit (INDEPENDENT_AMBULATORY_CARE_PROVIDER_SITE_OTHER): Payer: Medicare Other | Admitting: *Deleted

## 2011-06-04 ENCOUNTER — Ambulatory Visit (INDEPENDENT_AMBULATORY_CARE_PROVIDER_SITE_OTHER): Payer: Medicare Other | Admitting: Nurse Practitioner

## 2011-06-04 VITALS — BP 138/82 | HR 56 | Ht 74.0 in | Wt 193.0 lb

## 2011-06-04 DIAGNOSIS — Z7901 Long term (current) use of anticoagulants: Secondary | ICD-10-CM

## 2011-06-04 DIAGNOSIS — I4891 Unspecified atrial fibrillation: Secondary | ICD-10-CM

## 2011-06-04 DIAGNOSIS — I10 Essential (primary) hypertension: Secondary | ICD-10-CM

## 2011-06-04 MED ORDER — DIGOXIN 250 MCG PO TABS: 250.0000 ug | ORAL_TABLET | Freq: Every day | ORAL | Status: DC

## 2011-06-04 MED ORDER — LIPITOR 10 MG PO TABS: 10.0000 mg | ORAL_TABLET | Freq: Every day | ORAL | Status: DC

## 2011-06-04 MED ORDER — FINASTERIDE 5 MG PO TABS: 5.0000 mg | ORAL_TABLET | Freq: Every day | ORAL | Status: DC

## 2011-06-04 NOTE — Patient Instructions (Signed)
Stay on your current medicines Continue to check your coumadin levels monthly You will see Dr. Marca Ancona in 6 months. Call for any problems.

## 2011-06-04 NOTE — Assessment & Plan Note (Addendum)
His rate is controlled. He remains asymptomatic. He is not having any problems with his coumadin. Last echo was in 2010. He is tolerating his medicines. We will see him back in 6 months. Patient is agreeable to this plan and will call if any problems develop in the interim.

## 2011-06-04 NOTE — Assessment & Plan Note (Signed)
Blood pressure is ok. No changes in his medicines.

## 2011-06-04 NOTE — Progress Notes (Signed)
    Russell Dillon Date of Birth: 1946/05/13   History of Present Illness: Russell Dillon is seen back today for his 6 month Dillon. He is seen for Dr. Shirlee Latch. He is a former patient of Dr. Ronnald Nian. He has chronic atrial fib and is on coumadin anticoagulation. He continues to do well from our standpoint. No chest pain or shortness of breath. He is not lightheaded or dizzy. He is tolerating his medicines. No problems with excessive bruising or bleeding. He sees Dr. Clelia Croft for primary care. He has just returned from the beach and had a good time.   Current Outpatient Prescriptions on File Prior to Dillon  Medication Sig Dispense Refill  . diltiazem (CARDIZEM CD) 300 MG 24 hr capsule Take 300 mg by mouth daily.        Marland Kitchen warfarin (COUMADIN) 5 MG tablet Take 5 mg by mouth as directed.        Marland Kitchen DISCONTD: digoxin (LANOXIN) 0.25 MG tablet Take 250 mcg by mouth daily.        Marland Kitchen DISCONTD: finasteride (PROSCAR) 5 MG tablet Take 5 mg by mouth daily.        Marland Kitchen DISCONTD: LIPITOR 10 MG tablet TAKE ONE TABLET BY MOUTH EVERY DAY  90 each  3    No Known Allergies  Past Medical History  Diagnosis Date  . Chronic atrial fibrillation   . Hypertension   . Chronic anticoagulation   . Hyperlipidemia   . BPH (benign prostatic hypertrophy)   . Hx SBO   . Diastolic dysfunction October 2010    Normal LV systolic function    Past Surgical History  Procedure Date  . Cardioversion 05/01/2004  . US echocardiography 09/05/2009    ef 55-60%  . Cardiovascular stress test 09/30/2007    EF 67%  No ischemia.     History  Smoking status  . Never Smoker   Smokeless tobacco  . Never Used    History  Alcohol Use No    Family History  Problem Relation Age of Onset  . Heart attack Mother   . Hypertension Mother   . Diabetes Father   . Heart disease Father     Review of Systems: The review of systems is as above.  All other systems were reviewed and are negative.  Physical Exam: BP 138/82  Pulse 56  Ht 6\' 2"   (1.88 m)  Wt 193 lb (87.544 kg)  BMI 24.78 kg/m2 Patient is very pleasant and in no acute distress. Skin is warm and dry. Color is normal.  HEENT is unremarkable. Normocephalic/atraumatic. PERRL. Sclera are nonicteric. Neck is supple. No masses. No JVD. Lungs are clear. Cardiac exam shows an irregular rhythm. Rate is controlled. Abdomen is soft. Extremities are without edema. Gait and ROM are intact. No gross neurologic deficits noted.  LABORATORY DATA: INR is pending.  Assessment / Plan:

## 2011-06-05 ENCOUNTER — Encounter (INDEPENDENT_AMBULATORY_CARE_PROVIDER_SITE_OTHER): Payer: Medicare Other | Admitting: *Deleted

## 2011-06-05 DIAGNOSIS — I4891 Unspecified atrial fibrillation: Secondary | ICD-10-CM

## 2011-06-05 DIAGNOSIS — Z7901 Long term (current) use of anticoagulants: Secondary | ICD-10-CM

## 2011-06-09 NOTE — Progress Notes (Signed)
Agree with note.  Russell Dillon  

## 2011-07-02 ENCOUNTER — Ambulatory Visit (INDEPENDENT_AMBULATORY_CARE_PROVIDER_SITE_OTHER): Payer: Medicare Other | Admitting: *Deleted

## 2011-07-02 DIAGNOSIS — Z7901 Long term (current) use of anticoagulants: Secondary | ICD-10-CM

## 2011-07-02 DIAGNOSIS — I4891 Unspecified atrial fibrillation: Secondary | ICD-10-CM

## 2011-07-30 ENCOUNTER — Ambulatory Visit (INDEPENDENT_AMBULATORY_CARE_PROVIDER_SITE_OTHER): Payer: Medicare Other | Admitting: *Deleted

## 2011-07-30 DIAGNOSIS — Z7901 Long term (current) use of anticoagulants: Secondary | ICD-10-CM

## 2011-07-30 DIAGNOSIS — I4891 Unspecified atrial fibrillation: Secondary | ICD-10-CM

## 2011-08-13 LAB — GLUCOSE, CAPILLARY
Glucose-Capillary: 103 — ABNORMAL HIGH
Glucose-Capillary: 105 — ABNORMAL HIGH
Glucose-Capillary: 117 — ABNORMAL HIGH
Glucose-Capillary: 123 — ABNORMAL HIGH
Glucose-Capillary: 123 — ABNORMAL HIGH
Glucose-Capillary: 124 — ABNORMAL HIGH
Glucose-Capillary: 130 — ABNORMAL HIGH
Glucose-Capillary: 139 — ABNORMAL HIGH
Glucose-Capillary: 143 — ABNORMAL HIGH
Glucose-Capillary: 97

## 2011-08-13 LAB — CBC
HCT: 35 — ABNORMAL LOW
HCT: 27.5 — ABNORMAL LOW
HCT: 28 — ABNORMAL LOW
HCT: 32 — ABNORMAL LOW
HCT: 32.1 — ABNORMAL LOW
HCT: 34.2 — ABNORMAL LOW
HCT: 34.4 — ABNORMAL LOW
HCT: 36.8 — ABNORMAL LOW
Hemoglobin: 13.6
Hemoglobin: 10.7 — ABNORMAL LOW
Hemoglobin: 10.8 — ABNORMAL LOW
Hemoglobin: 11.6 — ABNORMAL LOW
Hemoglobin: 9.3 — ABNORMAL LOW
Hemoglobin: 9.3 — ABNORMAL LOW
MCHC: 33.4
MCHC: 32.8
MCHC: 34
MCHC: 34
MCHC: 34.1
MCV: 85.8
MCV: 84.4
MCV: 85
MCV: 85.2
MCV: 86.3
MCV: 86.6
Platelets: 349
Platelets: 361
Platelets: 196
Platelets: 197
Platelets: 242
Platelets: 260
Platelets: 320
Platelets: 353
Platelets: 382
RBC: 3.26 — ABNORMAL LOW
RBC: 3.89 — ABNORMAL LOW
RBC: 3.94 — ABNORMAL LOW
RBC: 3.97 — ABNORMAL LOW
RDW: 13.8
RDW: 14.3
RDW: 14.6
RDW: 14.7
RDW: 15.2
RDW: 15.2
RDW: 15.7 — ABNORMAL HIGH
RDW: 15.7 — ABNORMAL HIGH
RDW: 16.1 — ABNORMAL HIGH
RDW: 16.2 — ABNORMAL HIGH
WBC: 10.2
WBC: 12.6 — ABNORMAL HIGH
WBC: 11.5 — ABNORMAL HIGH
WBC: 3.5 — ABNORMAL LOW
WBC: 4.6
WBC: 9.7

## 2011-08-13 LAB — DIFFERENTIAL
Basophils Absolute: 0.1
Basophils Relative: 1
Eosinophils Absolute: 0
Eosinophils Absolute: 0.1
Eosinophils Absolute: 0.2
Eosinophils Relative: 0
Eosinophils Relative: 3
Lymphs Abs: 0.8
Lymphs Abs: 1.1
Monocytes Relative: 13 — ABNORMAL HIGH
Monocytes Relative: 14 — ABNORMAL HIGH
Neutro Abs: 2.7
Neutrophils Relative %: 58

## 2011-08-13 LAB — BASIC METABOLIC PANEL
BUN: 23
BUN: 12
BUN: 16
BUN: 20
CO2: 23
CO2: 26
CO2: 28
Calcium: 7.8 — ABNORMAL LOW
Calcium: 8 — ABNORMAL LOW
Calcium: 8.7
Chloride: 101
Chloride: 104
Chloride: 106
Chloride: 109
Creatinine, Ser: 0.63
Creatinine, Ser: 0.96
GFR calc Af Amer: >60
GFR calc non Af Amer: >60
GFR calc non Af Amer: >60
GFR calc non Af Amer: >60
Glucose, Bld: 104 — ABNORMAL HIGH
Glucose, Bld: 109 — ABNORMAL HIGH
Glucose, Bld: 116 — ABNORMAL HIGH
Glucose, Bld: 94
Glucose, Bld: 98
Potassium: 3.1 — ABNORMAL LOW
Potassium: 3.8
Potassium: 3.8
Potassium: 4.4
Sodium: 135
Sodium: 135
Sodium: 136
Sodium: 137
Sodium: 140

## 2011-08-13 LAB — COMPREHENSIVE METABOLIC PANEL
ALT: 10
ALT: 11
ALT: 12
AST: 25
AST: 13
AST: 16
Albumin: 3.3 — ABNORMAL LOW
Albumin: 2.6 — ABNORMAL LOW
Albumin: 2.7 — ABNORMAL LOW
Alkaline Phosphatase: 80
BUN: 27 — ABNORMAL HIGH
BUN: 8
BUN: 9
CO2: 27
CO2: 25
Calcium: 7.9 — ABNORMAL LOW
Calcium: 8 — ABNORMAL LOW
Chloride: 105
Chloride: 106
Creatinine, Ser: 0.66
Creatinine, Ser: 0.69
GFR calc Af Amer: >60
GFR calc Af Amer: >60
GFR calc Af Amer: >60
GFR calc non Af Amer: >60
GFR calc non Af Amer: >60
Glucose, Bld: 109 — ABNORMAL HIGH
Glucose, Bld: 116 — ABNORMAL HIGH
Glucose, Bld: 148 — ABNORMAL HIGH
Potassium: 3.3 — ABNORMAL LOW
Sodium: 129 — ABNORMAL LOW
Sodium: 135
Sodium: 135
Total Bilirubin: 0.8
Total Bilirubin: 0.8
Total Bilirubin: 1
Total Protein: 5.9 — ABNORMAL LOW
Total Protein: 6.2

## 2011-08-13 LAB — PROTIME-INR
INR: 2.1 — ABNORMAL HIGH
INR: 2.4 — ABNORMAL HIGH
INR: 1.1
INR: 2 — ABNORMAL HIGH
INR: 2 — ABNORMAL HIGH
INR: 3.4 — ABNORMAL HIGH
INR: 6.5
Prothrombin Time: 25 — ABNORMAL HIGH
Prothrombin Time: 14.4
Prothrombin Time: 14.5
Prothrombin Time: 15.7 — ABNORMAL HIGH
Prothrombin Time: 17.8 — ABNORMAL HIGH
Prothrombin Time: 21.4 — ABNORMAL HIGH
Prothrombin Time: 23.7 — ABNORMAL HIGH
Prothrombin Time: 24.3 — ABNORMAL HIGH
Prothrombin Time: 36.8 — ABNORMAL HIGH
Prothrombin Time: 68 — ABNORMAL HIGH

## 2011-08-13 LAB — CULTURE, BLOOD (ROUTINE X 2)

## 2011-08-13 LAB — ABO/RH: ABO/RH(D): A POS

## 2011-08-13 LAB — CROSSMATCH

## 2011-08-13 LAB — PHOSPHORUS: Phosphorus: 3.2

## 2011-08-13 LAB — LIPID PANEL
Total CHOL/HDL Ratio: 2
VLDL: 10

## 2011-08-13 LAB — MAGNESIUM
Magnesium: 1.6
Magnesium: 1.8
Magnesium: 2.1

## 2011-08-13 LAB — PREPARE FRESH FROZEN PLASMA

## 2011-08-13 LAB — PREALBUMIN
Prealbumin: 8.9 — ABNORMAL LOW
Prealbumin: 9.1 — ABNORMAL LOW

## 2011-08-13 LAB — HEPATIC FUNCTION PANEL
AST: 14
Albumin: 2.3 — ABNORMAL LOW
Total Bilirubin: 0.8
Total Protein: 5.8 — ABNORMAL LOW

## 2011-08-13 LAB — CLOSTRIDIUM DIFFICILE EIA: C difficile Toxins A+B, EIA: NEGATIVE

## 2011-08-13 LAB — LIPASE, BLOOD: Lipase: 23

## 2011-08-27 ENCOUNTER — Ambulatory Visit (INDEPENDENT_AMBULATORY_CARE_PROVIDER_SITE_OTHER): Payer: Medicare Other | Admitting: *Deleted

## 2011-08-27 DIAGNOSIS — I4891 Unspecified atrial fibrillation: Secondary | ICD-10-CM

## 2011-08-27 DIAGNOSIS — Z7901 Long term (current) use of anticoagulants: Secondary | ICD-10-CM

## 2011-09-17 ENCOUNTER — Ambulatory Visit (INDEPENDENT_AMBULATORY_CARE_PROVIDER_SITE_OTHER): Payer: Medicare Other | Admitting: *Deleted

## 2011-09-17 DIAGNOSIS — I4891 Unspecified atrial fibrillation: Secondary | ICD-10-CM

## 2011-09-17 DIAGNOSIS — Z7901 Long term (current) use of anticoagulants: Secondary | ICD-10-CM

## 2011-09-17 LAB — POCT INR: INR: 2.6

## 2011-10-15 ENCOUNTER — Ambulatory Visit (INDEPENDENT_AMBULATORY_CARE_PROVIDER_SITE_OTHER): Payer: Medicare Other | Admitting: *Deleted

## 2011-10-15 DIAGNOSIS — I4891 Unspecified atrial fibrillation: Secondary | ICD-10-CM

## 2011-10-15 DIAGNOSIS — Z7901 Long term (current) use of anticoagulants: Secondary | ICD-10-CM

## 2011-10-15 LAB — POCT INR: INR: 2.2

## 2011-11-13 ENCOUNTER — Encounter: Payer: Medicare Other | Admitting: *Deleted

## 2011-11-18 ENCOUNTER — Ambulatory Visit (INDEPENDENT_AMBULATORY_CARE_PROVIDER_SITE_OTHER): Payer: Medicare Other | Admitting: *Deleted

## 2011-11-18 DIAGNOSIS — I4891 Unspecified atrial fibrillation: Secondary | ICD-10-CM

## 2011-11-18 DIAGNOSIS — Z7901 Long term (current) use of anticoagulants: Secondary | ICD-10-CM | POA: Diagnosis not present

## 2011-11-18 LAB — POCT INR: INR: 1.9

## 2011-12-18 DIAGNOSIS — L570 Actinic keratosis: Secondary | ICD-10-CM | POA: Diagnosis not present

## 2011-12-18 DIAGNOSIS — D239 Other benign neoplasm of skin, unspecified: Secondary | ICD-10-CM | POA: Diagnosis not present

## 2011-12-19 ENCOUNTER — Ambulatory Visit (INDEPENDENT_AMBULATORY_CARE_PROVIDER_SITE_OTHER): Payer: Medicare Other | Admitting: Cardiology

## 2011-12-19 ENCOUNTER — Encounter: Payer: Self-pay | Admitting: Cardiology

## 2011-12-19 ENCOUNTER — Ambulatory Visit (INDEPENDENT_AMBULATORY_CARE_PROVIDER_SITE_OTHER): Payer: Medicare Other | Admitting: *Deleted

## 2011-12-19 ENCOUNTER — Encounter: Payer: Medicare Other | Admitting: *Deleted

## 2011-12-19 VITALS — BP 120/62 | HR 65 | Ht 73.5 in | Wt 187.0 lb

## 2011-12-19 DIAGNOSIS — E785 Hyperlipidemia, unspecified: Secondary | ICD-10-CM

## 2011-12-19 DIAGNOSIS — Z7901 Long term (current) use of anticoagulants: Secondary | ICD-10-CM | POA: Diagnosis not present

## 2011-12-19 DIAGNOSIS — I4891 Unspecified atrial fibrillation: Secondary | ICD-10-CM | POA: Diagnosis not present

## 2011-12-19 MED ORDER — DIGOXIN 125 MCG PO TABS: 125.0000 ug | ORAL_TABLET | Freq: Every day | ORAL | Status: DC

## 2011-12-19 MED ORDER — ATORVASTATIN CALCIUM 10 MG PO TABS: 10.0000 mg | ORAL_TABLET | Freq: Every day | ORAL | Status: DC

## 2011-12-19 MED ORDER — DILTIAZEM HCL ER COATED BEADS 300 MG PO CP24: 300.0000 mg | ORAL_CAPSULE | Freq: Every day | ORAL | Status: DC

## 2011-12-19 NOTE — Patient Instructions (Signed)
Decrease digoxin(lanoxin) to 0.125mg  daily. You can take one-half of a 0.25mg  tablet daily.  Call Dr Shirlee Latch and let him know if you want to change from coumadin(warfarin) to Xarelto.  Your physician wants you to follow-up in: 6 months with Dr Shirlee Latch.(August 2013). You will receive a reminder letter in the mail two months in advance. If you don't receive a letter, please call our office to schedule the follow-up appointment.

## 2011-12-20 DIAGNOSIS — E785 Hyperlipidemia, unspecified: Secondary | ICD-10-CM | POA: Insufficient documentation

## 2011-12-20 NOTE — Progress Notes (Signed)
PCP: Dr. Clelia Croft  66 yo with history of chronic atrial fibrillation presents for cardiology followup.  He has been seen by Dr. Deborah Chalk in the past and is seen by me for the first time today.  He has been doing well in general.  He does not feel palpitations.  He is still working for the city of [place].  He is quite active and does some heavy work with the [employer].  No exertional dyspnea or chest pain.  No bleeding problems with warfarin, no falls.    ECG: atrial fibrillation at 63  Labs (10/12): HCT 40.5, K 4.2, creatinine 1.0, LDL 73, HDL 54  PMH: 1. Chronic atrial fibrillation since 2005.  Echo (10/10) with EF 55-60%.  2. Stress myoview (11/08): EF 67%, no ischemia or infarction.  3. HTN 4. BPH 5. Hyperlipidemia 6. H/o SBO: No definite cause was ever found.   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  Married.  Never smoked.  Active with [employer].    FH: Mother with angina  ROS: All systems reviewed and negative except as per HPI.   Current Outpatient Prescriptions  Medication Sig Dispense Refill  . finasteride (PROSCAR) 5 MG tablet Take 1 tablet (5 mg total) by mouth daily.  90 tablet  3  . warfarin (COUMADIN) 5 MG tablet Take 5 mg by mouth as directed.        Marland Kitchen atorvastatin (LIPITOR) 10 MG tablet Take 1 tablet (10 mg total) by mouth daily.  90 tablet  3  . digoxin (LANOXIN) 0.125 MG tablet Take 1 tablet (125 mcg total) by mouth daily.  90 tablet  3  . diltiazem (CARDIZEM CD) 300 MG 24 hr capsule Take 1 capsule (300 mg total) by mouth daily.  90 capsule  3   BP 120/62  Pulse 65  Ht 6' 1.5" (1.867 m)  Wt 84.823 kg (187 lb)  BMI 24.34 kg/m2 General: NAD Neck: No JVD, no thyromegaly or thyroid nodule.  Lungs: Clear to auscultation bilaterally with normal respiratory effort. CV: Nondisplaced PMI.  Heart irregular S1/S2, no S3/S4, no murmur.  No peripheral edema.  No carotid bruit.   Normal pedal pulses.  Abdomen: Soft, nontender, no hepatosplenomegaly, no distention.  Neurologic: Alert and oriented x 3.  Psych: Normal affect. Extremities: No clubbing or cyanosis.

## 2011-12-20 NOTE — Assessment & Plan Note (Signed)
Excellent lipids when last checked in 10/12.

## 2011-12-20 NOTE — Assessment & Plan Note (Signed)
Chronic since 2005.  Good rate control.  Last echo in 2010 with normal EF.  I would ideally like to get him off digoxin.  Will decrease to 0.125 mg daily today then hopefully stop at the next appointment.  He will continue diltiazem.  He is on warfarin and has not had a lot of problems with it. I offered him Xarelto.  He is going to think about whether or not he wants to make this change.  I think either is reasonable as he has really not had any problems with warfarin.

## 2012-01-15 ENCOUNTER — Ambulatory Visit (INDEPENDENT_AMBULATORY_CARE_PROVIDER_SITE_OTHER): Payer: Medicare Other | Admitting: *Deleted

## 2012-01-15 DIAGNOSIS — I4891 Unspecified atrial fibrillation: Secondary | ICD-10-CM | POA: Diagnosis not present

## 2012-01-15 DIAGNOSIS — Z7901 Long term (current) use of anticoagulants: Secondary | ICD-10-CM | POA: Diagnosis not present

## 2012-01-15 LAB — POCT INR: INR: 2.6

## 2012-02-12 ENCOUNTER — Ambulatory Visit (INDEPENDENT_AMBULATORY_CARE_PROVIDER_SITE_OTHER): Payer: Medicare Other | Admitting: Pharmacist

## 2012-02-12 DIAGNOSIS — I4891 Unspecified atrial fibrillation: Secondary | ICD-10-CM | POA: Diagnosis not present

## 2012-02-12 DIAGNOSIS — Z7901 Long term (current) use of anticoagulants: Secondary | ICD-10-CM

## 2012-02-12 LAB — POCT INR: INR: 2.2

## 2012-03-26 ENCOUNTER — Ambulatory Visit (INDEPENDENT_AMBULATORY_CARE_PROVIDER_SITE_OTHER): Payer: Medicare Other

## 2012-03-26 DIAGNOSIS — I4891 Unspecified atrial fibrillation: Secondary | ICD-10-CM | POA: Diagnosis not present

## 2012-03-26 DIAGNOSIS — Z7901 Long term (current) use of anticoagulants: Secondary | ICD-10-CM | POA: Diagnosis not present

## 2012-04-20 ENCOUNTER — Other Ambulatory Visit: Payer: Self-pay | Admitting: *Deleted

## 2012-04-20 MED ORDER — WARFARIN SODIUM 5 MG PO TABS: ORAL_TABLET | ORAL | Status: DC

## 2012-04-28 ENCOUNTER — Ambulatory Visit (INDEPENDENT_AMBULATORY_CARE_PROVIDER_SITE_OTHER): Payer: Medicare Other | Admitting: *Deleted

## 2012-04-28 DIAGNOSIS — I4891 Unspecified atrial fibrillation: Secondary | ICD-10-CM

## 2012-04-28 DIAGNOSIS — Z7901 Long term (current) use of anticoagulants: Secondary | ICD-10-CM

## 2012-06-03 ENCOUNTER — Other Ambulatory Visit: Payer: Self-pay | Admitting: Nurse Practitioner

## 2012-06-08 ENCOUNTER — Ambulatory Visit (INDEPENDENT_AMBULATORY_CARE_PROVIDER_SITE_OTHER): Payer: Medicare Other

## 2012-06-08 ENCOUNTER — Other Ambulatory Visit: Payer: Self-pay

## 2012-06-08 DIAGNOSIS — Z7901 Long term (current) use of anticoagulants: Secondary | ICD-10-CM

## 2012-06-08 DIAGNOSIS — I4891 Unspecified atrial fibrillation: Secondary | ICD-10-CM | POA: Diagnosis not present

## 2012-06-08 LAB — POCT INR: INR: 1.7

## 2012-06-09 MED ORDER — ATORVASTATIN CALCIUM 10 MG PO TABS: 10.0000 mg | ORAL_TABLET | Freq: Every day | ORAL | Status: DC

## 2012-06-09 MED ORDER — DIGOXIN 125 MCG PO TABS: 125.0000 ug | ORAL_TABLET | Freq: Every day | ORAL | Status: DC

## 2012-06-29 ENCOUNTER — Ambulatory Visit (INDEPENDENT_AMBULATORY_CARE_PROVIDER_SITE_OTHER): Payer: Medicare Other | Admitting: *Deleted

## 2012-06-29 DIAGNOSIS — I4891 Unspecified atrial fibrillation: Secondary | ICD-10-CM

## 2012-06-29 DIAGNOSIS — Z7901 Long term (current) use of anticoagulants: Secondary | ICD-10-CM | POA: Diagnosis not present

## 2012-06-29 LAB — POCT INR: INR: 2.6

## 2012-06-29 MED ORDER — WARFARIN SODIUM 5 MG PO TABS: ORAL_TABLET | ORAL | Status: DC

## 2012-07-22 ENCOUNTER — Ambulatory Visit (INDEPENDENT_AMBULATORY_CARE_PROVIDER_SITE_OTHER): Payer: Medicare Other | Admitting: *Deleted

## 2012-07-22 DIAGNOSIS — I4891 Unspecified atrial fibrillation: Secondary | ICD-10-CM | POA: Diagnosis not present

## 2012-07-22 DIAGNOSIS — Z7901 Long term (current) use of anticoagulants: Secondary | ICD-10-CM

## 2012-08-10 ENCOUNTER — Ambulatory Visit (INDEPENDENT_AMBULATORY_CARE_PROVIDER_SITE_OTHER): Payer: Medicare Other | Admitting: *Deleted

## 2012-08-10 DIAGNOSIS — Z7901 Long term (current) use of anticoagulants: Secondary | ICD-10-CM

## 2012-08-10 DIAGNOSIS — I4891 Unspecified atrial fibrillation: Secondary | ICD-10-CM | POA: Diagnosis not present

## 2012-08-10 LAB — POCT INR: INR: 2.8

## 2012-09-02 ENCOUNTER — Other Ambulatory Visit: Payer: Self-pay | Admitting: Nurse Practitioner

## 2012-09-08 ENCOUNTER — Ambulatory Visit (INDEPENDENT_AMBULATORY_CARE_PROVIDER_SITE_OTHER): Payer: Medicare Other

## 2012-09-08 DIAGNOSIS — Z7901 Long term (current) use of anticoagulants: Secondary | ICD-10-CM | POA: Diagnosis not present

## 2012-09-08 DIAGNOSIS — I4891 Unspecified atrial fibrillation: Secondary | ICD-10-CM

## 2012-09-08 LAB — POCT INR: INR: 2.4

## 2012-09-28 DIAGNOSIS — R7301 Impaired fasting glucose: Secondary | ICD-10-CM | POA: Diagnosis not present

## 2012-09-28 DIAGNOSIS — I10 Essential (primary) hypertension: Secondary | ICD-10-CM | POA: Diagnosis not present

## 2012-09-28 DIAGNOSIS — E7800 Pure hypercholesterolemia, unspecified: Secondary | ICD-10-CM | POA: Diagnosis not present

## 2012-09-28 DIAGNOSIS — R972 Elevated prostate specific antigen [PSA]: Secondary | ICD-10-CM | POA: Diagnosis not present

## 2012-10-02 DIAGNOSIS — Z1212 Encounter for screening for malignant neoplasm of rectum: Secondary | ICD-10-CM | POA: Diagnosis not present

## 2012-10-05 DIAGNOSIS — I10 Essential (primary) hypertension: Secondary | ICD-10-CM | POA: Diagnosis not present

## 2012-10-05 DIAGNOSIS — Z0000 Encounter for general adult medical examination without abnormal findings: Secondary | ICD-10-CM | POA: Diagnosis not present

## 2012-10-05 DIAGNOSIS — I4891 Unspecified atrial fibrillation: Secondary | ICD-10-CM | POA: Diagnosis not present

## 2012-10-05 DIAGNOSIS — Z1331 Encounter for screening for depression: Secondary | ICD-10-CM | POA: Diagnosis not present

## 2012-10-12 ENCOUNTER — Ambulatory Visit (INDEPENDENT_AMBULATORY_CARE_PROVIDER_SITE_OTHER): Payer: Medicare Other | Admitting: *Deleted

## 2012-10-12 ENCOUNTER — Ambulatory Visit (INDEPENDENT_AMBULATORY_CARE_PROVIDER_SITE_OTHER): Payer: Medicare Other | Admitting: Cardiology

## 2012-10-12 ENCOUNTER — Encounter: Payer: Self-pay | Admitting: Cardiology

## 2012-10-12 VITALS — BP 124/84 | HR 87 | Ht 73.0 in | Wt 195.8 lb

## 2012-10-12 DIAGNOSIS — Z7901 Long term (current) use of anticoagulants: Secondary | ICD-10-CM | POA: Diagnosis not present

## 2012-10-12 DIAGNOSIS — E785 Hyperlipidemia, unspecified: Secondary | ICD-10-CM

## 2012-10-12 DIAGNOSIS — I4891 Unspecified atrial fibrillation: Secondary | ICD-10-CM

## 2012-10-12 MED ORDER — DILTIAZEM HCL ER COATED BEADS 360 MG PO CP24: 360.0000 mg | ORAL_CAPSULE | Freq: Every day | ORAL | Status: DC

## 2012-10-12 NOTE — Progress Notes (Signed)
Patient ID: Russell Dillon, male   DOB: 06-12-1946, 66 y.o.   MRN: [id number] PCP: Dr. Clelia Croft  66 yo with history of chronic atrial fibrillation presents for cardiology followup.  He has been doing well in general.  He does not feel palpitations.  He is still working part time for the city of [place].  He is quite active and does some heavy work with the [employer].  No exertional dyspnea or chest pain.  No bleeding problems with warfarin, no falls.  Does all his yardwork.  HR has been under good control.   ECG: atrial fibrillation at 71  Labs (10/12): HCT 40.5, K 4.2, creatinine 1.0, LDL 73, HDL 54 Labs (11/13): K 4.3, creatinine 1.0, LDL 79, HDL 58, TSH normal  PMH: 1. Chronic atrial fibrillation since 2005.  Echo (10/10) with EF 55-60%.  2. Stress myoview (11/08): EF 67%, no ischemia or infarction.  3. HTN 4. BPH 5. Hyperlipidemia 6. H/o SBO: No definite cause was ever found.   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  Married.  Never smoked.  Active with [employer].    FH: Mother with angina  ROS: All systems reviewed and negative except as per HPI.   Current Outpatient Prescriptions  Medication Sig Dispense Refill  . atorvastatin (LIPITOR) 10 MG tablet Take 1 tablet (10 mg total) by mouth daily.  90 tablet  1  . digoxin (LANOXIN) 0.125 MG tablet Take 1 tablet (125 mcg total) by mouth daily.  90 tablet  1  . diltiazem (CARDIZEM CD) 300 MG 24 hr capsule Take 1 capsule (300 mg total) by mouth daily.  90 capsule  3  . finasteride (PROSCAR) 5 MG tablet TAKE ONE TABLET BY MOUTH DAILY  90 tablet  2  . warfarin (COUMADIN) 5 MG tablet Take as directed by coumadin clinic  90 tablet  1   BP 124/84  Pulse 87  Ht 6\' 1"  (1.854 m)  Wt 195 lb 12.8 oz (88.814 kg)  BMI 25.83 kg/m2  SpO2 99% General: NAD Neck: No JVD, no thyromegaly or thyroid nodule.  Lungs: Clear to auscultation bilaterally  with normal respiratory effort. CV: Nondisplaced PMI.  Heart irregular S1/S2, no S3/S4, no murmur.  No peripheral edema.  No carotid bruit.  Normal pedal pulses.  Abdomen: Soft, nontender, no hepatosplenomegaly, no distention.  Neurologic: Alert and oriented x 3.  Psych: Normal affect. Extremities: No clubbing or cyanosis.   Assessment/Plan:  Atrial fibrillation  Chronic since 2005. Good rate control. Last echo in 2010 with normal EF.   At last visit, I decreased digoxin.  Today, I will stop digoxin and have him increase diltiazem CD to 360 mg daily.  He can check his pulse with his home BP cuff and will let me know if it is running consistently above 100 with the med changes.  He is on warfarin and has not had a lot of problems with it.  I offered today to let him switch to apixaban but he wants to continue warfarin.  Hyperlipidemia  Excellent lipids when last checked in 11/13.   Marca Ancona 10/12/2012 10:10 AM

## 2012-10-12 NOTE — Patient Instructions (Addendum)
Stop Digoxin.   Increase Diltiazem to 360mg  daily.  If your heart rate is regularly above 100, call our office. [phone]  Your physician wants you to follow-up in: 6 months with Dr Shirlee Latch. (June 2014). You will receive a reminder letter in the mail two months in advance. If you don't receive a letter, please call our office to schedule the follow-up appointment.

## 2012-11-13 DIAGNOSIS — N400 Enlarged prostate without lower urinary tract symptoms: Secondary | ICD-10-CM | POA: Diagnosis not present

## 2012-11-13 DIAGNOSIS — R972 Elevated prostate specific antigen [PSA]: Secondary | ICD-10-CM | POA: Diagnosis not present

## 2012-11-25 ENCOUNTER — Ambulatory Visit (INDEPENDENT_AMBULATORY_CARE_PROVIDER_SITE_OTHER): Payer: Medicare Other | Admitting: *Deleted

## 2012-11-25 DIAGNOSIS — Z7901 Long term (current) use of anticoagulants: Secondary | ICD-10-CM | POA: Diagnosis not present

## 2012-11-25 DIAGNOSIS — I4891 Unspecified atrial fibrillation: Secondary | ICD-10-CM

## 2012-11-25 LAB — POCT INR: INR: 2.2

## 2012-12-04 ENCOUNTER — Other Ambulatory Visit: Payer: Self-pay | Admitting: *Deleted

## 2012-12-04 MED ORDER — WARFARIN SODIUM 5 MG PO TABS: ORAL_TABLET | ORAL | Status: DC

## 2012-12-16 DIAGNOSIS — D1801 Hemangioma of skin and subcutaneous tissue: Secondary | ICD-10-CM | POA: Diagnosis not present

## 2012-12-16 DIAGNOSIS — L821 Other seborrheic keratosis: Secondary | ICD-10-CM | POA: Diagnosis not present

## 2012-12-16 DIAGNOSIS — L570 Actinic keratosis: Secondary | ICD-10-CM | POA: Diagnosis not present

## 2013-01-06 ENCOUNTER — Ambulatory Visit (INDEPENDENT_AMBULATORY_CARE_PROVIDER_SITE_OTHER): Payer: Medicare Other | Admitting: *Deleted

## 2013-01-06 ENCOUNTER — Telehealth: Payer: Self-pay | Admitting: *Deleted

## 2013-01-06 DIAGNOSIS — I4891 Unspecified atrial fibrillation: Secondary | ICD-10-CM

## 2013-01-06 DIAGNOSIS — Z7901 Long term (current) use of anticoagulants: Secondary | ICD-10-CM

## 2013-01-06 LAB — POCT INR: INR: 2.4

## 2013-01-06 NOTE — Telephone Encounter (Signed)
Russell Dillon can you change this medication, prescribe and please call patient. Thanks, Selena Batten

## 2013-01-06 NOTE — Telephone Encounter (Signed)
Message copied by Carmela Hurt on Wed Jan 06, 2013  1:39 PM ------      Message from: Laurey Morale      Created: Wed Jan 06, 2013 11:40 AM      Regarding: RE: substitute a medication that has increased in price       He can try Toprol XL 100 mg daily to replace the diltiazem.  May not work as well so will need to keep an eye on his HR.       ----- Message -----         From: Carmela Hurt, RN         Sent: 01/06/2013  11:32 AM           To: Laurey Morale, MD, Jacqlyn Krauss, RN      Subject: substitute a medication that has increased i#            Is there a substitute for diltiazem ER 360 24 hr/cd caps            90 day supply Price $15.00 went to $120.00                  Please call patients       ------

## 2013-01-07 ENCOUNTER — Telehealth: Payer: Self-pay | Admitting: Cardiology

## 2013-01-07 NOTE — Telephone Encounter (Signed)
Would take metoprolol tartrate 75 mg po bid.  Call us in 2 weeks with pulse readings.

## 2013-01-07 NOTE — Telephone Encounter (Signed)
N/A.  LMTC. 

## 2013-01-07 NOTE — Telephone Encounter (Signed)
Mrs Ulbrich checked and the Toprol XL (succinate) is a tier 3.  The only tier 1 medication is Metoprolol Tartrate.  They are wondering if he can switch to the tartrate?

## 2013-01-08 NOTE — Telephone Encounter (Signed)
LMTCB

## 2013-01-11 NOTE — Telephone Encounter (Signed)
I spoke with pt. Pt wants to think about changing to metoprolol tartrate 75mg  bid instead of diltiazem. Right now he is going to continue diltiazem. He will let me know if he decides to change to metoprolol.

## 2013-01-27 ENCOUNTER — Telehealth: Payer: Self-pay | Admitting: *Deleted

## 2013-01-27 NOTE — Telephone Encounter (Signed)
Dr Shirlee Latch received authorization from [facility] [phone]   for Diltiazem as a tier cost sharing exception. This is valid February 13,2014- December 31,2014. Pt is aware.

## 2013-02-17 ENCOUNTER — Ambulatory Visit (INDEPENDENT_AMBULATORY_CARE_PROVIDER_SITE_OTHER): Payer: Medicare Other | Admitting: *Deleted

## 2013-02-17 DIAGNOSIS — I4891 Unspecified atrial fibrillation: Secondary | ICD-10-CM | POA: Diagnosis not present

## 2013-02-17 DIAGNOSIS — Z7901 Long term (current) use of anticoagulants: Secondary | ICD-10-CM

## 2013-02-17 LAB — POCT INR: INR: 2.3

## 2013-03-03 ENCOUNTER — Other Ambulatory Visit: Payer: Self-pay

## 2013-03-03 DIAGNOSIS — I4891 Unspecified atrial fibrillation: Secondary | ICD-10-CM

## 2013-03-03 MED ORDER — DILTIAZEM HCL ER COATED BEADS 360 MG PO CP24: 360.0000 mg | ORAL_CAPSULE | Freq: Every day | ORAL | Status: DC

## 2013-03-03 NOTE — Telephone Encounter (Signed)
Patient Instructions    Stop Digoxin.  Increase Diltiazem to 360mg  daily.  If your heart rate is regularly above 100, call our office. [phone]  Your physician wants you to follow-up in: 6 months with Dr Shirlee Latch. (June 2014). You will receive a reminder letter in the mail two months in advance. If you don't receive a letter, please call our office to schedule the follow-up appointment.      Provider Department Encounter #   09/02/2012 4:59 PM Marca Ancona, MD [facility] [id number]

## 2013-03-29 ENCOUNTER — Ambulatory Visit (INDEPENDENT_AMBULATORY_CARE_PROVIDER_SITE_OTHER): Payer: Medicare Other | Admitting: *Deleted

## 2013-03-29 ENCOUNTER — Emergency Department (INDEPENDENT_AMBULATORY_CARE_PROVIDER_SITE_OTHER)
Admission: EM | Admit: 2013-03-29 | Discharge: 2013-03-29 | Disposition: A | Discharge status: Home / Self Care | Payer: Medicare Other | Source: Home / Self Care | Attending: Emergency Medicine | Admitting: Emergency Medicine

## 2013-03-29 ENCOUNTER — Encounter (HOSPITAL_COMMUNITY): Payer: Self-pay | Admitting: Emergency Medicine

## 2013-03-29 ENCOUNTER — Telehealth: Payer: Self-pay | Admitting: Cardiology

## 2013-03-29 DIAGNOSIS — Z7901 Long term (current) use of anticoagulants: Secondary | ICD-10-CM

## 2013-03-29 DIAGNOSIS — I4891 Unspecified atrial fibrillation: Secondary | ICD-10-CM

## 2013-03-29 DIAGNOSIS — S8010XA Contusion of unspecified lower leg, initial encounter: Secondary | ICD-10-CM | POA: Diagnosis not present

## 2013-03-29 DIAGNOSIS — S8012XA Contusion of left lower leg, initial encounter: Secondary | ICD-10-CM

## 2013-03-29 LAB — POCT INR: INR: 2.9

## 2013-03-29 MED ORDER — CEPHALEXIN 500 MG PO CAPS: 500.0000 mg | ORAL_CAPSULE | Freq: Four times a day (QID) | ORAL | Status: AC

## 2013-03-29 NOTE — ED Provider Notes (Signed)
History     CSN: [id number]  Arrival date & time 03/29/13  1559   First MD Initiated Contact with Patient 03/29/13 1624      Chief Complaint  Patient presents with  . Leg Injury  . Cellulitis    (Consider location/radiation/quality/duration/timing/severity/associated sxs/prior treatment) HPI Comments: Patient presents urgent care after having been seen at the left lower cardiology clinic for his INR check. Patient sustained a fall Saturday, sustaining multiple abrasions to the anterior abdomen was left tibia. Also developing a area of swelling and tenderness on the medial aspect of his proximal the region. Swelling has been going down progressively but is noticing a bit of tenderness on the areas around his abrasions. An increased warmth now is oozing clear fluid patient is suspected that area has got infected.  Patient is a 67 y.o. male presenting with leg pain.  Leg Pain Location:  Leg Leg location:  L leg Pain details:    Quality:  Aching   Radiates to:  Does not radiate   Severity:  Mild   Timing:  Constant Chronicity:  New Dislocation: no   Foreign body present:  No foreign bodies Tetanus status:  Out of date Prior injury to area:  Yes Ineffective treatments:  None tried Associated symptoms: swelling   Associated symptoms: no fatigue, no fever, no itching, no muscle weakness and no numbness   Risk factors: no frequent fractures and no known bone disorder     Past Medical History  Diagnosis Date  . Chronic atrial fibrillation   . Hypertension   . Chronic anticoagulation   . Hyperlipidemia   . BPH (benign prostatic hypertrophy)   . Hx SBO   . Diastolic dysfunction October 2010    Normal LV systolic function    Past Surgical History  Procedure Laterality Date  . Cardioversion  05/01/2004  . US echocardiography  09/05/2009    ef 55-60%  . Cardiovascular stress test  09/30/2007    EF 67%  No ischemia.     Family History  Problem Relation Age of Onset  .  Heart attack Mother   . Hypertension Mother   . Diabetes Father   . Heart disease Father     History  Substance Use Topics  . Smoking status: Never Smoker   . Smokeless tobacco: Never Used  . Alcohol Use: No      Review of Systems  Constitutional: Negative for fever and fatigue.  Cardiovascular: Positive for leg swelling.  Musculoskeletal: Positive for joint swelling.  Skin: Positive for color change and wound. Negative for itching, pallor and rash.    Allergies  Review of patient's allergies indicates no known allergies.  Home Medications   Current Outpatient Rx  Name  Route  Sig  Dispense  Refill  . atorvastatin (LIPITOR) 10 MG tablet   Oral   Take 1 tablet (10 mg total) by mouth daily.   90 tablet   1   . diltiazem (CARDIZEM CD) 360 MG 24 hr capsule   Oral   Take 1 capsule (360 mg total) by mouth daily.   30 capsule   6   . finasteride (PROSCAR) 5 MG tablet      TAKE ONE TABLET BY MOUTH DAILY   90 tablet   2   . warfarin (COUMADIN) 5 MG tablet      Take as directed by coumadin clinic   90 tablet   1     This is 3 months supply   . cephALEXin (  KEFLEX) 500 MG capsule   Oral   Take 1 capsule (500 mg total) by mouth 4 (four) times daily.   28 capsule   0     BP 134/81  Pulse 98  Temp(Src) 97.8 F (36.6 C) (Oral)  Resp 18  SpO2 100%  Physical Exam  Nursing note and vitals reviewed. Constitutional: Vital signs are normal. He appears well-developed and well-nourished.  Non-toxic appearance. He does not have a sickly appearance. He does not appear ill. No distress.  Musculoskeletal: He exhibits tenderness.       Legs: Neurological: He is alert.  Skin: No rash noted. No erythema.    ED Course  Procedures (including critical care time)  Labs Reviewed - No data to display No results found.   1. Hematoma of lower limb, left, initial encounter       MDM  Status post trauma left lower extremity. Medially-tibial developed hematoma-  erythema 20 rash petechia or increased warmth suggestive of cellulitis. Patient be started on Keflex for her to return if no significant improvement in next 48 hours. Keep affected leg elevated as much as possible.        Jimmie Molly, MD 03/29/13 2005

## 2013-03-29 NOTE — Telephone Encounter (Signed)
New Problem:    Patient called in because he tripped over something in his garage this past weekend and got a few bumps and scrapes on his les.  Patient reported that most of the swelling has gone down and he is not having any issues but would like Dr. Shirlee Latch to take a look at the swelling that is left in his leg because he is on coumadin.  Patient has not had any pain or bleeding accept from his scrape site and is walking normally.  Please call back.

## 2013-03-29 NOTE — ED Notes (Signed)
Bacitracin cream applied to left leg abrasion and right knee and tefla bandage and tape applied. Pt tolerated well.

## 2013-03-29 NOTE — ED Notes (Signed)
Pt c/o fall onset Saturday. Saw Dr today at Coumadin clinic and nurse informed him he has a hematoma with cellulitis and would need antibiotics. Left leg is very bruised with hematoma on calf. Leg is red and swollen is also warm. Also has abrasions on lower leg. Pt is alert and oriented.

## 2013-03-29 NOTE — Telephone Encounter (Signed)
Spoke with pt. Pt states no numbness in feet and toes, no problems walking. I spoke with Kenard Gower in [facility]. Appt for PT changed from 03/31/13 to today at 3:15PM .  [facility] will take a look at patient's legs. [facility] aware Dr Shirlee Latch available for consultation if necessary. Pt agreed with this plan.

## 2013-04-06 ENCOUNTER — Encounter (HOSPITAL_COMMUNITY): Payer: Self-pay | Admitting: Emergency Medicine

## 2013-04-06 ENCOUNTER — Emergency Department (INDEPENDENT_AMBULATORY_CARE_PROVIDER_SITE_OTHER)
Admission: EM | Admit: 2013-04-06 | Discharge: 2013-04-06 | Disposition: A | Discharge status: Home / Self Care | Payer: Medicare Other | Source: Home / Self Care | Attending: Emergency Medicine | Admitting: Emergency Medicine

## 2013-04-06 DIAGNOSIS — S8012XA Contusion of left lower leg, initial encounter: Secondary | ICD-10-CM

## 2013-04-06 DIAGNOSIS — S8010XA Contusion of unspecified lower leg, initial encounter: Secondary | ICD-10-CM | POA: Diagnosis not present

## 2013-04-06 MED ORDER — CEPHALEXIN 500 MG PO CAPS: 500.0000 mg | ORAL_CAPSULE | Freq: Three times a day (TID) | ORAL | Status: AC

## 2013-04-06 NOTE — ED Provider Notes (Signed)
History     CSN: [id number]  Arrival date & time 04/06/13  1523   First MD Initiated Contact with Patient 04/06/13 1558      Chief Complaint  Patient presents with  . Follow-up    pt here for follow up from 5/19 visit.     (Consider location/radiation/quality/duration/timing/severity/associated sxs/prior treatment) HPI Comments: The patient presents urgent care describing that he took his antibiotics as prescribed and swelling redness and tenderness around the hematoma on his anterior aspect of his left tibia have improved significantly. Patient describes it he is no longer sore around it but does still have some redness and mild increased warmth. He describes that the swelling of his ankle area and around his leg has also subsided and improved. Patient denies any new symptoms such as fevers, chills, worsening swelling or pain. He took antibiotics for 7 days and is wondering if perhaps he could take a few more days as he was working pretty good for him. He has also been applying heat pads which he notices helps immensely with swelling.   Patient is a 67 y.o. male presenting with leg pain. The history is provided by the patient.  Leg Pain Location:  Leg Injury: yes   Mechanism of injury: fall   Associated symptoms: no fever     Past Medical History  Diagnosis Date  . Chronic atrial fibrillation   . Hypertension   . Chronic anticoagulation   . Hyperlipidemia   . BPH (benign prostatic hypertrophy)   . Hx SBO   . Diastolic dysfunction October 2010    Normal LV systolic function    Past Surgical History  Procedure Laterality Date  . Cardioversion  05/01/2004  . US echocardiography  09/05/2009    ef 55-60%  . Cardiovascular stress test  09/30/2007    EF 67%  No ischemia.     Family History  Problem Relation Age of Onset  . Heart attack Mother   . Hypertension Mother   . Diabetes Father   . Heart disease Father     History  Substance Use Topics  . Smoking status:  Never Smoker   . Smokeless tobacco: Never Used  . Alcohol Use: No      Review of Systems  Constitutional: Negative for fever, chills and diaphoresis.  Musculoskeletal: Negative for myalgias and joint swelling.  Skin: Positive for color change, pallor, rash and wound.    Allergies  Review of patient's allergies indicates no known allergies.  Home Medications   Current Outpatient Rx  Name  Route  Sig  Dispense  Refill  . atorvastatin (LIPITOR) 10 MG tablet   Oral   Take 1 tablet (10 mg total) by mouth daily.   90 tablet   1   . diltiazem (CARDIZEM CD) 360 MG 24 hr capsule   Oral   Take 1 capsule (360 mg total) by mouth daily.   30 capsule   6   . finasteride (PROSCAR) 5 MG tablet      TAKE ONE TABLET BY MOUTH DAILY   90 tablet   2   . warfarin (COUMADIN) 5 MG tablet      Take as directed by coumadin clinic   90 tablet   1     This is 3 months supply   . cephALEXin (KEFLEX) 500 MG capsule   Oral   Take 1 capsule (500 mg total) by mouth 3 (three) times daily.   15 capsule   0  BP 133/68  Pulse 100  Temp(Src) 98.3 F (36.8 C) (Oral)  Resp 19  SpO2 100%  Physical Exam  Nursing note and vitals reviewed. Constitutional: He appears well-developed and well-nourished.  Musculoskeletal: He exhibits tenderness.       Left knee: He exhibits normal range of motion.       Legs: Skin: No rash noted. There is erythema. No pallor.    ED Course  Procedures (including critical care time)  Labs Reviewed - No data to display No results found.   1. Hematoma of left lower extremity   2. Cellulitis LLE    MDM  Status-post, trauma hematoma, tibial region. Mild erythema- cellulitis?  Responded to Keflex and mild elevation- hematoma also reabsorbing well. Instructed patient, to RTC if no resolution-  Extended- abx coverage to 5 days.     Jimmie Molly, MD 04/06/13 1725

## 2013-04-06 NOTE — ED Notes (Signed)
Pt here for follow up visit from 5/19. Pt states swelling in leg has gone down tremendously.  Having no pain. No signs of infection would appears well healed. Pt state just finished antibiotic yesterday. Pt voices no concerns.

## 2013-04-13 ENCOUNTER — Encounter (HOSPITAL_COMMUNITY): Payer: Self-pay | Admitting: *Deleted

## 2013-04-13 ENCOUNTER — Emergency Department (INDEPENDENT_AMBULATORY_CARE_PROVIDER_SITE_OTHER)
Admission: EM | Admit: 2013-04-13 | Discharge: 2013-04-13 | Disposition: A | Discharge status: Home / Self Care | Payer: Medicare Other | Source: Home / Self Care | Attending: Emergency Medicine | Admitting: Emergency Medicine

## 2013-04-13 DIAGNOSIS — T148XXA Other injury of unspecified body region, initial encounter: Secondary | ICD-10-CM

## 2013-04-13 NOTE — ED Provider Notes (Signed)
Chief Complaint:   Chief Complaint  Patient presents with  . Wound Check    History of Present Illness:   Russell Dillon is a pleasant 67 year old male who was in for his third followup following an injury to his left leg. The injury occurred on May 19. He hit and scraped it against a car. He is on Coumadin so he had a large hematoma on the upper tibia, just below the knee, and a large abrasion on the lower tibia just above the ankle. He originally saw his nurse at the Coumadin clinic or urgent a, over here. He was seen here and placed on antibiotics. He returned again on May 27 for second followup. He was doing better. He was urged to come back for third followup. Right now he still has the hematoma but is getting better. The abrasion is healing up. There is minimal swelling, no purulent drainage, no erythema, no pain, and no fever. He has finished up his antibiotics.  Review of Systems:  Other than noted above, the patient denies any of the following symptoms: Systemic:  No fevers, chills, sweats, or aches.  No fatigue or tiredness. Musculoskeletal:  No joint pain, arthritis, bursitis, swelling, back pain, or neck pain. Neurological:  No muscular weakness, paresthesias, headache, or trouble with speech or coordination.  No dizziness.  PMFSH:  Past medical history, family history, social history, meds, and allergies were reviewed.  He takes Lipitor, diltiazem, finasteride, warfarin, and finished up a course of cephalexin. He has atrial fibrillation and elevated cholesterol.  Physical Exam:   Vital signs:  BP 126/72  Pulse 93  Temp(Src) 98.2 F (36.8 C) (Oral)  Resp 16  SpO2 100% Gen:  Alert and oriented times 3.  In no distress. Musculoskeletal: He has a hematoma of the upper tibia, just below the knee. This measures 4-5 cm in diameter. It feels soft and is nontender. There is no induration or erythema, no evidence of infection. The abrasion on the lower tibial area is healing up well.  There is no evidence of infection, no purulent drainage.  Otherwise, all joints had a full a ROM with no swelling, bruising or deformity.  No edema, pulses full. Extremities were warm and pink.  Capillary refill was brisk.  Skin:  Clear, warm and dry.  No rash. Neuro:  Alert and oriented times 3.  Muscle strength was normal.  Sensation was intact to light touch.   Course in Urgent Care Center:   Antibiotic ointment was applied to the abrasion and the leg was wrapped with 5 inch Ace wrap from the foot to the knee.  Assessment:  The encounter diagnosis was Hematoma.  This appears to be healing up well and he does not require any further followups.  Plan:   1.  The following meds were prescribed:   Discharge Medication List as of 04/13/2013 11:51 AM     2.  The patient was instructed in symptomatic care, including rest and activity, elevation, and compression.  Appropriate handouts were given. 3.  The patient was told to return if becoming worse in any way, if no better in 3 or 4 days, and given some red flag symptoms such as any evidence of infection such as erythema, swelling, or pain that would indicate earlier return.   4.  The patient was told to follow up here if it should become worse in any way.    Reuben Likes, MD 04/13/13 1359

## 2013-04-13 NOTE — ED Notes (Signed)
Pt    Here  Today  For  A  Recheck       Of  His   l  Leg      He  Was  Seen     ucc  Recently  For  An abrasion   Secondary   To  A    Leg            Leg  Injury  He  Is  On  Coumadin  And  He  Reports  Although  He  Has  Some  Swelling          Present

## 2013-05-10 ENCOUNTER — Ambulatory Visit (INDEPENDENT_AMBULATORY_CARE_PROVIDER_SITE_OTHER): Payer: Medicare Other | Admitting: *Deleted

## 2013-05-10 DIAGNOSIS — Z7901 Long term (current) use of anticoagulants: Secondary | ICD-10-CM | POA: Diagnosis not present

## 2013-05-10 DIAGNOSIS — I4891 Unspecified atrial fibrillation: Secondary | ICD-10-CM

## 2013-05-12 ENCOUNTER — Other Ambulatory Visit: Payer: Self-pay | Admitting: Cardiology

## 2013-06-01 ENCOUNTER — Encounter: Payer: Self-pay | Admitting: Cardiology

## 2013-06-01 ENCOUNTER — Ambulatory Visit (INDEPENDENT_AMBULATORY_CARE_PROVIDER_SITE_OTHER): Payer: Medicare Other | Admitting: Cardiology

## 2013-06-01 VITALS — BP 136/88 | HR 81 | Ht 73.0 in | Wt 201.0 lb

## 2013-06-01 DIAGNOSIS — E785 Hyperlipidemia, unspecified: Secondary | ICD-10-CM | POA: Diagnosis not present

## 2013-06-01 DIAGNOSIS — I4891 Unspecified atrial fibrillation: Secondary | ICD-10-CM

## 2013-06-01 NOTE — Patient Instructions (Addendum)
Your physician wants you to follow-up in: 6 months with Dr Shirlee Latch. Sherrie Mustache 2015). You will receive a reminder letter in the mail two months in advance. If you don't receive a letter, please call our office to schedule the follow-up appointment.

## 2013-06-01 NOTE — Progress Notes (Signed)
Patient ID: Russell Dillon, male   DOB: [birth year], 67 y.o.   MRN: [id number] PCP: Dr. Clelia Croft  67 yo with history of chronic atrial fibrillation presents for cardiology followup.  He has been doing well in general.  He does not feel palpitations.  He is still working part time for the city of [place].  He is quite active and does some heavy work with the [employer].  No exertional dyspnea or chest pain.  No bleeding problems with warfarin, no falls.  Does all his yardwork.  HR has been under good control since stopping digoxin and increasing diltiazem CD.   ECG: atrial fibrillation at 81 with right axis deviation  Labs (10/12): HCT 40.5, K 4.2, creatinine 1.0, LDL 73, HDL 54 Labs (11/13): K 4.3, creatinine 1.0, LDL 79, HDL 58, TSH normal  PMH: 1. Chronic atrial fibrillation since 2005.  Echo (10/10) with EF 55-60%.  2. Stress myoview (11/08): EF 67%, no ischemia or infarction.  3. HTN 4. BPH 5. Hyperlipidemia 6. H/o SBO: No definite cause was ever found.   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  Married.  Never smoked.  Active with [employer].  Daughter is professor at [employer].   FH: Mother with angina  Current Outpatient Prescriptions  Medication Sig Dispense Refill  . atorvastatin (LIPITOR) 10 MG tablet Take 1 tablet (10 mg total) by mouth daily.  90 tablet  1  . diltiazem (CARDIZEM CD) 360 MG 24 hr capsule Take 1 capsule (360 mg total) by mouth daily.  30 capsule  6  . finasteride (PROSCAR) 5 MG tablet TAKE ONE TABLET BY MOUTH DAILY  90 tablet  2  . warfarin (COUMADIN) 5 MG tablet TAKE AS DIRECTED BY COUMADIN CLINIC  90 tablet  0   No current facility-administered medications for this visit.   BP 136/88  Pulse 81  Ht 6\' 1"  (1.854 m)  Wt 91.173 kg (201 lb)  BMI 26.52 kg/m2 General: NAD Neck: No JVD, no thyromegaly or thyroid nodule.  Lungs: Clear to auscultation  bilaterally with normal respiratory effort. CV: Nondisplaced PMI.  Heart irregular S1/S2, no S3/S4, no murmur.  Trace ankle edema.  No carotid bruit.  Normal pedal pulses.  Abdomen: Soft, nontender, no hepatosplenomegaly, no distention.  Neurologic: Alert and oriented x 3.  Psych: Normal affect. Extremities: No clubbing or cyanosis.   Assessment/Plan:  Atrial fibrillation  Chronic since 2005. Good rate control. Last echo in 2010 with normal EF.  He is doing fine off digoxin on a higher dose of diltiazem CD.  He is on warfarin and has not had a lot of problems with it.  I have offered to let him switch to apixaban but he wants to continue warfarin (only getting INRs every 6 wks now).  Hyperlipidemia  Excellent lipids when last checked in 11/13.  Will try to obtain a copy of labs from PCP.   Marca Ancona 06/01/2013

## 2013-06-21 ENCOUNTER — Ambulatory Visit (INDEPENDENT_AMBULATORY_CARE_PROVIDER_SITE_OTHER): Payer: Medicare Other | Admitting: *Deleted

## 2013-06-21 DIAGNOSIS — I4891 Unspecified atrial fibrillation: Secondary | ICD-10-CM | POA: Diagnosis not present

## 2013-06-21 DIAGNOSIS — Z7901 Long term (current) use of anticoagulants: Secondary | ICD-10-CM

## 2013-06-21 LAB — POCT INR: INR: 3.1

## 2013-07-08 ENCOUNTER — Encounter (HOSPITAL_COMMUNITY): Payer: Self-pay | Admitting: *Deleted

## 2013-07-08 ENCOUNTER — Emergency Department (HOSPITAL_COMMUNITY): Payer: Medicare Other

## 2013-07-08 ENCOUNTER — Emergency Department (INDEPENDENT_AMBULATORY_CARE_PROVIDER_SITE_OTHER): Payer: Medicare Other

## 2013-07-08 ENCOUNTER — Encounter (HOSPITAL_COMMUNITY): Payer: Self-pay | Admitting: Emergency Medicine

## 2013-07-08 ENCOUNTER — Inpatient Hospital Stay (HOSPITAL_COMMUNITY)
Admission: EM | Admit: 2013-07-08 | Discharge: 2013-07-20 | DRG: 388 | Disposition: A | Discharge status: Home / Self Care | Payer: Medicare Other | Attending: Internal Medicine | Admitting: Internal Medicine

## 2013-07-08 ENCOUNTER — Emergency Department (INDEPENDENT_AMBULATORY_CARE_PROVIDER_SITE_OTHER)
Admission: EM | Admit: 2013-07-08 | Discharge: 2013-07-08 | Disposition: A | Discharge status: Nursing Home (Includes ICF) | Payer: Medicare Other | Source: Home / Self Care

## 2013-07-08 DIAGNOSIS — K439 Ventral hernia without obstruction or gangrene: Secondary | ICD-10-CM | POA: Diagnosis present

## 2013-07-08 DIAGNOSIS — I482 Chronic atrial fibrillation, unspecified: Secondary | ICD-10-CM | POA: Diagnosis present

## 2013-07-08 DIAGNOSIS — Z79899 Other long term (current) drug therapy: Secondary | ICD-10-CM

## 2013-07-08 DIAGNOSIS — E876 Hypokalemia: Secondary | ICD-10-CM | POA: Diagnosis not present

## 2013-07-08 DIAGNOSIS — J40 Bronchitis, not specified as acute or chronic: Secondary | ICD-10-CM | POA: Diagnosis not present

## 2013-07-08 DIAGNOSIS — Z6825 Body mass index (BMI) 25.0-25.9, adult: Secondary | ICD-10-CM

## 2013-07-08 DIAGNOSIS — K56609 Unspecified intestinal obstruction, unspecified as to partial versus complete obstruction: Secondary | ICD-10-CM | POA: Diagnosis not present

## 2013-07-08 DIAGNOSIS — T148XXA Other injury of unspecified body region, initial encounter: Secondary | ICD-10-CM

## 2013-07-08 DIAGNOSIS — E785 Hyperlipidemia, unspecified: Secondary | ICD-10-CM | POA: Diagnosis not present

## 2013-07-08 DIAGNOSIS — I4891 Unspecified atrial fibrillation: Secondary | ICD-10-CM | POA: Diagnosis present

## 2013-07-08 DIAGNOSIS — K8020 Calculus of gallbladder without cholecystitis without obstruction: Secondary | ICD-10-CM | POA: Diagnosis present

## 2013-07-08 DIAGNOSIS — R791 Abnormal coagulation profile: Secondary | ICD-10-CM | POA: Diagnosis present

## 2013-07-08 DIAGNOSIS — E46 Unspecified protein-calorie malnutrition: Secondary | ICD-10-CM | POA: Diagnosis not present

## 2013-07-08 DIAGNOSIS — R110 Nausea: Secondary | ICD-10-CM | POA: Diagnosis not present

## 2013-07-08 DIAGNOSIS — Z7901 Long term (current) use of anticoagulants: Secondary | ICD-10-CM

## 2013-07-08 DIAGNOSIS — E871 Hypo-osmolality and hyponatremia: Secondary | ICD-10-CM | POA: Diagnosis not present

## 2013-07-08 DIAGNOSIS — J209 Acute bronchitis, unspecified: Secondary | ICD-10-CM | POA: Diagnosis present

## 2013-07-08 DIAGNOSIS — K565 Intestinal adhesions [bands], unspecified as to partial versus complete obstruction: Secondary | ICD-10-CM

## 2013-07-08 DIAGNOSIS — I10 Essential (primary) hypertension: Secondary | ICD-10-CM | POA: Diagnosis not present

## 2013-07-08 DIAGNOSIS — R141 Gas pain: Secondary | ICD-10-CM | POA: Diagnosis not present

## 2013-07-08 DIAGNOSIS — R05 Cough: Secondary | ICD-10-CM

## 2013-07-08 DIAGNOSIS — R059 Cough, unspecified: Secondary | ICD-10-CM | POA: Diagnosis not present

## 2013-07-08 DIAGNOSIS — S8010XA Contusion of unspecified lower leg, initial encounter: Secondary | ICD-10-CM | POA: Diagnosis not present

## 2013-07-08 DIAGNOSIS — E43 Unspecified severe protein-calorie malnutrition: Secondary | ICD-10-CM | POA: Diagnosis not present

## 2013-07-08 DIAGNOSIS — R197 Diarrhea, unspecified: Secondary | ICD-10-CM | POA: Diagnosis not present

## 2013-07-08 HISTORY — DX: Unspecified intestinal obstruction, unspecified as to partial versus complete obstruction: K56.609

## 2013-07-08 LAB — COMPREHENSIVE METABOLIC PANEL
ALT: 15 U/L (ref 0–53)
AST: 18 U/L (ref 0–37)
Alkaline Phosphatase: 86 U/L (ref 39–117)
CO2: 23 mEq/L (ref 19–32)
Calcium: 9.2 mg/dL (ref 8.4–10.5)
Chloride: 101 mEq/L (ref 96–112)
GFR calc Af Amer: 75 mL/min — ABNORMAL LOW (ref >90)
GFR calc non Af Amer: 64 mL/min — ABNORMAL LOW (ref >90)
Glucose, Bld: 122 mg/dL — ABNORMAL HIGH (ref 70–99)
Sodium: 136 mEq/L (ref 135–145)
Total Bilirubin: 0.6 mg/dL (ref 0.3–1.2)

## 2013-07-08 LAB — CBC WITH DIFFERENTIAL/PLATELET
Basophils Absolute: 0 10*3/uL (ref 0.0–0.1)
Eosinophils Relative: 2 % (ref 0–5)
HCT: 38.9 % — ABNORMAL LOW (ref 39.0–52.0)
Lymphocytes Relative: 16 % (ref 12–46)
Lymphs Abs: 0.8 10*3/uL (ref 0.7–4.0)
MCV: 86.4 fL (ref 78.0–100.0)
Neutro Abs: 3 10*3/uL (ref 1.7–7.7)
Platelets: 227 10*3/uL (ref 150–400)
RBC: 4.5 MIL/uL (ref 4.22–5.81)
RDW: 14.4 % (ref 11.5–15.5)
WBC: 4.7 10*3/uL (ref 4.0–10.5)

## 2013-07-08 MED ORDER — IOHEXOL 300 MG/ML  SOLN: 25.0000 mL | INTRAMUSCULAR | Status: AC

## 2013-07-08 MED ORDER — IOHEXOL 300 MG/ML  SOLN
100.0000 mL | Freq: Once | INTRAMUSCULAR | Status: AC | PRN
  Administered 2013-07-08: 100 mL via INTRAVENOUS

## 2013-07-08 MED ORDER — POTASSIUM CHLORIDE 10 MEQ/100ML IV SOLN
10.0000 meq | Freq: Once | INTRAVENOUS | Status: AC
  Administered 2013-07-08: 10 meq via INTRAVENOUS
  Filled 2013-07-08: qty 100

## 2013-07-08 MED ORDER — SODIUM CHLORIDE 0.9 % IV BOLUS (SEPSIS)
1000.0000 mL | Freq: Once | INTRAVENOUS | Status: AC
  Administered 2013-07-08: 1000 mL via INTRAVENOUS

## 2013-07-08 NOTE — ED Provider Notes (Signed)
CSN: [id number]     Arrival date & time 07/08/13  1321 History   First MD Initiated Contact with Patient 07/08/13 1458     Chief Complaint  Patient presents with  . Cough  . Diarrhea   (Consider location/radiation/quality/duration/timing/severity/associated sxs/prior Treatment) HPI Comments: Pleasant, 67 year old male presents with a dry cough for approximately one week. Shortly after the cough developed he [place] racetr[place] and realized he was inhaling various jeans and particles in the air that come with racing. His cough was a little more frequent at that time. Otherwise it has not changed. There is no productive sputum. And no shortness of breath.  Is also complaining of diarrhea of approximately 5-6 days. States she has been eating generally well but in the past 2-3 days beginning a little worse he alternates solid and liquid stools in small amounts. He has taken Imodium right ear which is offered modest relief. Denies nausea, vomiting, fever, chest pain or shortness of breath.   Past Medical History  Diagnosis Date  . Chronic atrial fibrillation   . Hypertension   . Chronic anticoagulation   . Hyperlipidemia   . BPH (benign prostatic hypertrophy)   . Hx SBO   . Diastolic dysfunction October 2010    Normal LV systolic function   Past Surgical History  Procedure Laterality Date  . Cardioversion  05/01/2004  . US echocardiography  09/05/2009    ef 55-60%  . Cardiovascular stress test  09/30/2007    EF 67%  No ischemia.    Family History  Problem Relation Age of Onset  . Heart attack Mother   . Hypertension Mother   . Diabetes Father   . Heart disease Father    History  Substance Use Topics  . Smoking status: Never Smoker   . Smokeless tobacco: Never Used  . Alcohol Use: No    Review of Systems  Constitutional: Negative.   HENT: Positive for voice change and postnasal drip. Negative for ear pain, congestion, sore throat and rhinorrhea.   Respiratory: Positive  for cough. Negative for choking, chest tightness, shortness of breath and wheezing.   Cardiovascular: Negative for chest pain and leg swelling.  Gastrointestinal: Positive for diarrhea and abdominal distention. Negative for nausea, vomiting and abdominal pain.  Skin: Negative.   Neurological: Negative.     Allergies  Review of patient's allergies indicates no known allergies.  Home Medications   Current Outpatient Rx  Name  Route  Sig  Dispense  Refill  . EXPIRED: atorvastatin (LIPITOR) 10 MG tablet   Oral   Take 1 tablet (10 mg total) by mouth daily.   90 tablet   1   . diltiazem (CARDIZEM CD) 360 MG 24 hr capsule   Oral   Take 1 capsule (360 mg total) by mouth daily.   30 capsule   6   . finasteride (PROSCAR) 5 MG tablet      TAKE ONE TABLET BY MOUTH DAILY   90 tablet   2   . warfarin (COUMADIN) 5 MG tablet      TAKE AS DIRECTED BY COUMADIN CLINIC   90 tablet   0    BP 111/66  Pulse 90  Temp(Src) 98.3 F (36.8 C) (Oral)  Resp 20  SpO2 95% Physical Exam  Nursing note and vitals reviewed. Constitutional: He is oriented to person, place, and time. He appears well-developed and well-nourished. No distress.  HENT:  Oropharynx moist with minor, even erythema to the posterior pharynx and palatine  arch. No exudates or swelling.  Eyes: Conjunctivae are normal.  Neck: Normal range of motion. Neck supple.  Cardiovascular:  Irregular rhythm consistent with his diagnosis of atrial fibrillation. Ventricular response 85-98 apical.  Pulmonary/Chest: Effort normal and breath sounds normal. No respiratory distress. He has no wheezes. He has no rales.  Abdominal: Soft. Bowel sounds are normal. He exhibits no mass. There is no tenderness. There is no rebound and no guarding.  Mild distention. The upper hemiabdomen and a portion of the lower abdomen percusses tympanic. Bowel sounds are normal active.  Musculoskeletal: He exhibits no edema.  Lymphadenopathy:    He has no  cervical adenopathy.  Neurological: He is alert and oriented to person, place, and time. He exhibits normal muscle tone.  Skin: Skin is warm and dry.  Psychiatric: He has a normal mood and affect.    ED Course  Procedures (including critical care time) Labs Review Labs Reviewed - No data to display Imaging Review Dg Abd 1 View  07/08/2013   *RADIOLOGY REPORT*  Clinical Data: Abdominal distension.  Diarrhea.  ABDOMEN - 1 VIEW  Comparison: 09/29/2008.  Findings: There is a paucity of colonic gas, with only some distal sigmoid gas appreciated.  Marked dilatation of the small bowel is noted throughout the entire abdomen, with bowel loops measuring up to 6.4 cm in diameter.  No definite evidence of pneumoperitoneum is appreciated on today's supine films.  Sclerotic lesion in the right ilium is again noted, favored to represent a large bone island.  IMPRESSION: 1.  Bowel gas pattern, as above, concerning for small bowel obstruction. 2.  No frank pneumoperitoneum at this time.   Original Report Authenticated By: Trudie Reed, M.D.    MDM   1. Small bowel obstruction   2. Cough    Patient is being transferred to the emergency department for possible small bowel obstruction.    Hayden Rasmussen, NP 07/08/13 [phone]

## 2013-07-08 NOTE — ED Notes (Signed)
C/o dry cough which started Friday.  Patient went to a race track and his cough got worst.  Patient states he has had diarrhea which started Sunday.  Patient did take OTC diarrhea medication which helped a little.

## 2013-07-08 NOTE — Consult Note (Signed)
Reason for Consult:Partial SBO Referring Physician: Dr. Sabino Niemann is an 67 y.o. male.  HPI: This is a 67 yo male with some chronic medical issues who underwent exploratory laparotomy with lysis of adhesions for SBO in 08/2008 by Dr. Johna Sheriff.  Prior to that time, he had not had any previous surgery.  Since that time, the patient has been doing fairly well, with bowel movements alternating between diarrhea and firm bowel movements.  Over the last 4-5 days, he has been having some diarrhea, as well as passing large amounts of flatus.  No nausea, no abdominal pain, no distention.  He had three bowel movements today.  He has also developed a persistent cough.  He went to Urgent Care today about his cough.  Plain films there showed dilated SB, so he was sent to the ED for evaluation.  CT scan showed SBO, although clinically, he has no small bowel obstruction.  We are subsequently consulted.  Past Medical History  Diagnosis Date  . Chronic atrial fibrillation   . Hypertension   . Chronic anticoagulation   . Hyperlipidemia   . BPH (benign prostatic hypertrophy)   . Hx SBO   . Diastolic dysfunction October 2010    Normal LV systolic function  . Small bowel obstruction     Past Surgical History  Procedure Laterality Date  . Cardioversion  05/01/2004  . US echocardiography  09/05/2009    ef 55-60%  . Cardiovascular stress test  09/30/2007    EF 67%  No ischemia.   Exploratory laparotomy with lysis of adhesions  09/10/2008 by Dr. Johna Sheriff  Family History  Problem Relation Age of Onset  . Heart attack Mother   . Hypertension Mother   . Diabetes Father   . Heart disease Father     Social History:  reports that he has never smoked. He has never used smokeless tobacco. He reports that he does not drink alcohol or use illicit drugs.  Allergies: No Known Allergies  Medications:  Prior to Admission medications   Medication Sig Start Date End Date Taking? Authorizing  Provider  atorvastatin (LIPITOR) 10 MG tablet Take 10 mg by mouth at bedtime.   Yes Historical Provider, MD  diltiazem (CARDIZEM CD) 360 MG 24 hr capsule Take 360 mg by mouth at bedtime.   Yes Historical Provider, MD  finasteride (PROSCAR) 5 MG tablet Take 5 mg by mouth every morning.   Yes Historical Provider, MD  warfarin (COUMADIN) 5 MG tablet Take 2.5-5 mg by mouth every evening. Sundays ONLY-0.5 half tab (2.5 mg total); All other days-1 tab (5 mg total)   Yes Historical Provider, MD     Results for orders placed during the hospital encounter of 07/08/13 (from the past 48 hour(s))  CBC WITH DIFFERENTIAL     Status: Abnormal   Collection Time    07/08/13  4:47 PM      Result Value Range   WBC 4.7  4.0 - 10.5 K/uL   RBC 4.50  4.22 - 5.81 MIL/uL   Hemoglobin 13.4  13.0 - 17.0 g/dL   HCT 14.7 (*) 82.9 - 56.2 %   MCV 86.4  78.0 - 100.0 fL   MCH 29.8  26.0 - 34.0 pg   MCHC 34.4  30.0 - 36.0 g/dL   RDW 13.0  86.5 - 78.4 %   Platelets 227  150 - 400 K/uL   Neutrophils Relative % 64  43 - 77 %   Neutro Abs 3.0  1.7 - 7.7 K/uL   Lymphocytes Relative 16  12 - 46 %   Lymphs Abs 0.8  0.7 - 4.0 K/uL   Monocytes Relative 17 (*) 3 - 12 %   Monocytes Absolute 0.8  0.1 - 1.0 K/uL   Eosinophils Relative 2  0 - 5 %   Eosinophils Absolute 0.1  0.0 - 0.7 K/uL   Basophils Relative 0  0 - 1 %   Basophils Absolute 0.0  0.0 - 0.1 K/uL  COMPREHENSIVE METABOLIC PANEL     Status: Abnormal   Collection Time    07/08/13  4:47 PM      Result Value Range   Sodium 136  135 - 145 mEq/L   Potassium 3.3 (*) 3.5 - 5.1 mEq/L   Chloride 101  96 - 112 mEq/L   CO2 23  19 - 32 mEq/L   Glucose, Bld 122 (*) 70 - 99 mg/dL   BUN 15  6 - 23 mg/dL   Creatinine, Ser 1.61  0.50 - 1.35 mg/dL   Calcium 9.2  8.4 - 09.6 mg/dL   Total Protein 8.1  6.0 - 8.3 g/dL   Albumin 3.8  3.5 - 5.2 g/dL   AST 18  0 - 37 U/L   ALT 15  0 - 53 U/L   Alkaline Phosphatase 86  39 - 117 U/L   Total Bilirubin 0.6  0.3 - 1.2 mg/dL   GFR  calc non Af Amer 64 (*) >90 mL/min   GFR calc Af Amer 75 (*) >90 mL/min   Comment: (NOTE)     The eGFR has been calculated using the CKD EPI equation.     This calculation has not been validated in all clinical situations.     eGFR's persistently <90 mL/min signify possible Chronic Kidney     Disease.  LIPASE, BLOOD     Status: None   Collection Time    07/08/13  4:47 PM      Result Value Range   Lipase 12  11 - 59 U/L  PROTIME-INR     Status: Abnormal   Collection Time    07/08/13  7:24 PM      Result Value Range   Prothrombin Time 33.9 (*) 11.6 - 15.2 seconds   INR 3.51 (*) 0.00 - 1.49    Dg Chest 2 View  07/08/2013   *RADIOLOGY REPORT*  Clinical Data: Cough  CHEST - 2 VIEW  Comparison: 11th 2009  Findings: The cardiac silhouette is normal in size and configuration.  The mediastinum is normal in contour caliber. There are no hilar masses.  The lungs are clear.  No pleural effusion or pneumothorax.  Mild elevation of left hemidiaphragm is stable.  The bony thorax is demineralized but grossly intact.  IMPRESSION: No acute cardiopulmonary disease.   Original Report Authenticated By: Amie Portland, M.D.   Dg Abd 1 View  07/08/2013   *RADIOLOGY REPORT*  Clinical Data: Abdominal distension.  Diarrhea.  ABDOMEN - 1 VIEW  Comparison: 09/29/2008.  Findings: There is a paucity of colonic gas, with only some distal sigmoid gas appreciated.  Marked dilatation of the small bowel is noted throughout the entire abdomen, with bowel loops measuring up to 6.4 cm in diameter.  No definite evidence of pneumoperitoneum is appreciated on today's supine films.  Sclerotic lesion in the right ilium is again noted, favored to represent a large bone island.  IMPRESSION: 1.  Bowel gas pattern, as above, concerning for small bowel obstruction. 2.  No frank pneumoperitoneum at this time.   Original Report Authenticated By: Trudie Reed, M.D.   Ct Abdomen Pelvis W Contrast  07/08/2013   *RADIOLOGY REPORT*  Clinical  Data: Abdominal pain question small bowel obstruction on radiographs, past history of hypertension, small bowel obstruction, atrial fibrillation  CT ABDOMEN AND PELVIS WITH CONTRAST  Technique:  Multidetector CT imaging of the abdomen and pelvis was performed following the standard protocol during bolus administration of intravenous contrast. Sagittal and coronal MPR images reconstructed from axial data set.  Contrast: OMNIPAQUE IOHEXOL 300 MG/ML  SOLN Dilute oral contrast.  Comparison: 09/29/2008 Correlation:  Abdominal radiographs 07/08/2013  Findings: Lung bases clear. Dependent gallstones in gallbladder. Para esophageal herniation of fat with a small amount of accompanying fluid/edema. Liver, spleen, pancreas, and adrenal glands normal appearance. Symmetric nephrograms with small nonobstructing calculus and small cyst at inferior pole of left kidney.  Small supraumbilical ventral hernia containing fat and minimal fluid. Marked dilatation of proximal small bowel loops with decompressed distal small bowel loops compatible with small bowel obstruction. Transition zone is at the mid small bowel, left of midline in the mid abdomen axial image 44 question adhesion. No definite bowel wall thickening or perforation. Colon contains fluid but is otherwise decompressed.  No mass, adenopathy, or free air. Unremarkable bladder with markedly enlarged prostate gland 7.5 x 7.4 x 7.1 cm. Right inguinal hernia containing fat. Scattered degenerative disc and facet disease changes of the lumbar spine with bilateral spondylolysis of L5 without significant spondylolisthesis. Scattered atherosclerotic calcifications aorta.  IMPRESSION: High-grade small bowel obstruction at the mid small bowel with transition zone in the left mid abdomen question adhesion. No definite evidence of wall thickening or perforation. Cholelithiasis. Marked prostatic enlargement. Tiny supraumbilical ventral hernia containing fat.   Original Report  Authenticated By: Ulyses Southward, M.D.    ROS Blood pressure 110/50, pulse 94, temperature 98.7 F (37.1 C), temperature source Oral, resp. rate 15, SpO2 99.00%. Physical Exam  Assessment/Plan: 1.  Clinically, the patient does not have signs of bowel obstruction, despite the CT findings.  With his diarrhea, he could have gastroenteritis 2.  Cholelithiasis 3.  Supraumbilical ventral hernia - no bowel incarceration 4.  Prostatic hypertrophy  Recs:   No indication at this time for NG tube Would keep the patient hydrated and NPO x ice chips Would hold Coumadin until INR closer to 2.0, in case he develops a true SBO and might need surgery.  Would not aggressively reverse INR at this time. We will follow with you during this hospitalization   Richardson Dubree K. 07/08/2013, 10:16 PM

## 2013-07-08 NOTE — ED Notes (Signed)
Pt sent here from ucc. Pt reports dry cough x 1 week. Having diarrhea for several days. Had xray done at [facility] and sent here due to small bowel obstruction. Pt denies any abd pain or n/v. No acute distress noted at this time.

## 2013-07-08 NOTE — ED Notes (Signed)
MD at bedside. 

## 2013-07-08 NOTE — H&P (Signed)
Russell Dillon is an 67 y.o. male.   Chief Complaint: diarrhea HPI: 67 yo male with 6 day hx of diarrhea.  Not  A  Bad case.  He was eating fine until 3 days ago.  The diarrhea worsened 3 days ago. He couldn't get to the bathroom. Some stool was solid, some watery, some gas and some semi-solid.  Came to urgent care today for dry cough for two weeks and the diarrhea.  No fever.  No blood.  He says mild abd tenderness since he had the oral contrast for the ct scan tonight.  In the ER Ct shows possible bowel obstruction.  Medical admission requested.  Past Medical History  Diagnosis Date  . Chronic atrial fibrillation   . Hypertension   . Chronic anticoagulation   . Hyperlipidemia   . BPH (benign prostatic hypertrophy)   . Hx SBO   . Diastolic dysfunction October 2010    Normal LV systolic function  . 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 Cardioversion-Dr. Parke Simmers cards  05/01/2004  . US echocardiography  09/05/2009    ef 55-60%  . Cardiovascular stress test  09/30/2007    EF 67%  No ischemia.     Family History  Problem Relation Age of Onset  . Heart attack Mother   . Hypertension Mother   . Diabetes Father   . Heart disease Father    Social History:  [name] wife, married 1968,   One daughter. No GC. reports that he has never smoked. He has never used smokeless tobacco. He reports that he does not drink alcohol or use illicit drugs. 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  Allergies: No Known Allergies  Meds:  Digoxin 0.25 mg po daily Finasteride 5 mg daily Lipitor 10 mg po daily cardizem CD 300 mg po daily Coumadin 5 mg 6 days a week and 2.5 mg each Sunday Imodium for the last two days.    Results for orders placed during the hospital encounter of 07/08/13 (from the past 48 hour(s))  CBC WITH DIFFERENTIAL     Status: Abnormal   Collection Time    07/08/13  4:47 PM      Result Value Range   WBC 4.7  4.0 - 10.5 K/uL   RBC 4.50  4.22 - 5.81 MIL/uL   Hemoglobin 13.4  13.0 - 17.0 g/dL   HCT 16.1 (*) 09.6 - 04.5 %   MCV 86.4  78.0 - 100.0 fL   MCH 29.8  26.0 - 34.0 pg   MCHC 34.4  30.0 - 36.0 g/dL   RDW 40.9  81.1 - 91.4 %   Platelets 227  150 - 400 K/uL   Neutrophils Relative % 64  43 - 77 %   Neutro Abs 3.0  1.7 - 7.7 K/uL   Lymphocytes Relative 16  12 - 46 %   Lymphs Abs 0.8  0.7 - 4.0 K/uL   Monocytes Relative 17 (*) 3 - 12 %   Monocytes Absolute 0.8  0.1 - 1.0 K/uL   Eosinophils Relative 2  0 - 5 %   Eosinophils Absolute 0.1  0.0 - 0.7 K/uL   Basophils Relative 0  0 - 1 %   Basophils Absolute 0.0  0.0 - 0.1 K/uL  COMPREHENSIVE METABOLIC PANEL     Status: Abnormal   Collection Time  07/08/13  4:47 PM      Result Value Range   Sodium 136  135 - 145 mEq/L   Potassium 3.3 (*) 3.5 - 5.1 mEq/L   Chloride 101  96 - 112 mEq/L   CO2 23  19 - 32 mEq/L   Glucose, Bld 122 (*) 70 - 99 mg/dL   BUN 15  6 - 23 mg/dL   Creatinine, Ser 1.61  0.50 - 1.35 mg/dL   Calcium 9.2  8.4 - 09.6 mg/dL   Total Protein 8.1  6.0 - 8.3 g/dL   Albumin 3.8  3.5 - 5.2 g/dL   AST 18  0 - 37 U/L   ALT 15  0 - 53 U/L   Alkaline Phosphatase 86  39 - 117 U/L   Total Bilirubin 0.6  0.3 - 1.2 mg/dL   GFR calc non Af Amer 64 (*) >90 mL/min   GFR calc Af Amer 75 (*) >90 mL/min   Comment: (NOTE)     The eGFR has been calculated using the CKD EPI equation.     This calculation has not been validated in all clinical situations.     eGFR's persistently <90 mL/min signify possible Chronic Kidney     Disease.  LIPASE, BLOOD     Status: None   Collection Time    07/08/13  4:47 PM      Result Value Range   Lipase 12  11 - 59 U/L  PROTIME-INR     Status: Abnormal   Collection Time    07/08/13  7:24 PM      Result Value Range   Prothrombin Time 33.9 (*) 11.6 - 15.2 seconds   INR 3.51 (*) 0.00 - 1.49   Dg Chest 2 View  07/08/2013    *RADIOLOGY REPORT*  Clinical Data: Cough  CHEST - 2 VIEW  Comparison: 11th 2009  Findings: The cardiac silhouette is normal in size and configuration.  The mediastinum is normal in contour caliber. There are no hilar masses.  The lungs are clear.  No pleural effusion or pneumothorax.  Mild elevation of left hemidiaphragm is stable.  The bony thorax is demineralized but grossly intact.  IMPRESSION: No acute cardiopulmonary disease.   Original Report Authenticated By: Amie Portland, M.D.   Dg Abd 1 View  07/08/2013   *RADIOLOGY REPORT*  Clinical Data: Abdominal distension.  Diarrhea.  ABDOMEN - 1 VIEW  Comparison: 09/29/2008.  Findings: There is a paucity of colonic gas, with only some distal sigmoid gas appreciated.  Marked dilatation of the small bowel is noted throughout the entire abdomen, with bowel loops measuring up to 6.4 cm in diameter.  No definite evidence of pneumoperitoneum is appreciated on today's supine films.  Sclerotic lesion in the right ilium is again noted, favored to represent a large bone island.  IMPRESSION: 1.  Bowel gas pattern, as above, concerning for small bowel obstruction. 2.  No frank pneumoperitoneum at this time.   Original Report Authenticated By: Trudie Reed, M.D.   Ct Abdomen Pelvis W Contrast  07/08/2013   *RADIOLOGY REPORT*  Clinical Data: Abdominal pain question small bowel obstruction on radiographs, past history of hypertension, small bowel obstruction, atrial fibrillation  CT ABDOMEN AND PELVIS WITH CONTRAST  Technique:  Multidetector CT imaging of the abdomen and pelvis was performed following the standard protocol during bolus administration of intravenous contrast. Sagittal and coronal MPR images reconstructed from axial data set.  Contrast: OMNIPAQUE IOHEXOL 300 MG/ML  SOLN Dilute oral contrast.  Comparison: 09/29/2008 Correlation:  Abdominal radiographs 07/08/2013  Findings: Lung bases clear. Dependent gallstones in gallbladder. Para esophageal herniation  of fat with a small amount of accompanying fluid/edema. Liver, spleen, pancreas, and adrenal glands normal appearance. Symmetric nephrograms with small nonobstructing calculus and small cyst at inferior pole of left kidney.  Small supraumbilical ventral hernia containing fat and minimal fluid. Marked dilatation of proximal small bowel loops with decompressed distal small bowel loops compatible with small bowel obstruction. Transition zone is at the mid small bowel, left of midline in the mid abdomen axial image 44 question adhesion. No definite bowel wall thickening or perforation. Colon contains fluid but is otherwise decompressed.  No mass, adenopathy, or free air. Unremarkable bladder with markedly enlarged prostate gland 7.5 x 7.4 x 7.1 cm. Right inguinal hernia containing fat. Scattered degenerative disc and facet disease changes of the lumbar spine with bilateral spondylolysis of L5 without significant spondylolisthesis. Scattered atherosclerotic calcifications aorta.  IMPRESSION: High-grade small bowel obstruction at the mid small bowel with transition zone in the left mid abdomen question adhesion. No definite evidence of wall thickening or perforation. Cholelithiasis. Marked prostatic enlargement. Tiny supraumbilical ventral hernia containing fat.   Original Report Authenticated By: Ulyses Southward, M.D.    ROS:as per hpi  Blood pressure 117/65, pulse 94, temperature 98.7 F (37.1 C), temperature source Oral, resp. rate 18, SpO2 99.00%.  wd,wn male. nad. some dry cough.  no jvd,  lungs cta bilat no w/r/r. heart regular rate, irregularly irregular, no m/r/g. abd mildly distended , slightly hyperactive bowel sounds,  not tender, no mass,  no hsm.  no edema. moe times 4.   neurologically intact.  Assessment/Plan 67 yo male with cough and diarrhea and CT findings compatible with SBO although physical exam is not impressive for this now.  Admit to tele bed.  Attempt to continue oral meds except we will  hold coumadin.  We will monitor inr daily.  Replace K.   Inr may go up further before going down.  Hydrate with d5 1/2 NS with 20 Kcl per liter.  Give a few days of IV azithromycin for the cough and tussionex prn.  zofran prn.  Full code status.     Ezequiel Kayser, MD 07/08/2013, 10:41 PM

## 2013-07-08 NOTE — ED Notes (Signed)
Patient transported to XR then to CT

## 2013-07-08 NOTE — ED Provider Notes (Signed)
CSN: [id number]     Arrival date & time 07/08/13  1628 History   First MD Initiated Contact with Patient 07/08/13 1846     Chief Complaint  Patient presents with  . Cough  . Diarrhea   (Consider location/radiation/quality/duration/timing/severity/associated sxs/prior Treatment) The history is provided by the patient.  Russell Dillon is a 67 y.o. male history chronic afib on coumadin, HTN, HL, SBO here with cough and diarrhea. Intermittent diarrhea for the last week. Symptoms as watery but most of the time its loose stool. Denies any recent antibiotic use the last month. Also has some intermittent dry cough for one week but denies any fevers or chills. Went to urgent care and had x-ray that showed possible SBO sent in for evaluation. He had history of SBO with similar symptoms previously but denies any vomiting this time.    Past Medical History  Diagnosis Date  . Chronic atrial fibrillation   . Hypertension   . Chronic anticoagulation   . Hyperlipidemia   . BPH (benign prostatic hypertrophy)   . Hx SBO   . Diastolic dysfunction October 2010    Normal LV systolic function  . Small bowel obstruction    Past Surgical History  Procedure Laterality Date  . Cardioversion  05/01/2004  . US echocardiography  09/05/2009    ef 55-60%  . Cardiovascular stress test  09/30/2007    EF 67%  No ischemia.    Family History  Problem Relation Age of Onset  . Heart attack Mother   . Hypertension Mother   . Diabetes Father   . Heart disease Father    History  Substance Use Topics  . Smoking status: Never Smoker   . Smokeless tobacco: Never Used  . Alcohol Use: No    Review of Systems  Respiratory: Positive for cough.   Gastrointestinal: Positive for abdominal pain and diarrhea.  All other systems reviewed and are negative.    Allergies  Review of patient's allergies indicates no known allergies.  Home Medications   Current Outpatient Rx  Name  Route  Sig  Dispense  Refill   . atorvastatin (LIPITOR) 10 MG tablet   Oral   Take 10 mg by mouth at bedtime.         Marland Kitchen diltiazem (CARDIZEM CD) 360 MG 24 hr capsule   Oral   Take 360 mg by mouth at bedtime.         . finasteride (PROSCAR) 5 MG tablet   Oral   Take 5 mg by mouth every morning.         . warfarin (COUMADIN) 5 MG tablet   Oral   Take 2.5-5 mg by mouth every evening. Sundays ONLY-0.5 half tab (2.5 mg total); All other days-1 tab (5 mg total)          BP 110/50  Pulse 94  Temp(Src) 98.7 F (37.1 C) (Oral)  Resp 15  SpO2 99% Physical Exam  Nursing note and vitals reviewed. Constitutional: He is oriented to person, place, and time. He appears well-developed.  Comfortable, NAD   HENT:  Head: Normocephalic.  Mouth/Throat: Oropharynx is clear and moist.  Eyes: Conjunctivae are normal. Pupils are equal, round, and reactive to light.  Neck: Normal range of motion. Neck supple.  Cardiovascular: Normal rate, regular rhythm and normal heart sounds.   Pulmonary/Chest: Effort normal and breath sounds normal. No respiratory distress. He has no wheezes. He has no rales.  Abdominal: Soft.  Distended, tympanic. Nontender. Surgical scar well  healed   Musculoskeletal: Normal range of motion.  Neurological: He is alert and oriented to person, place, and time.  Skin: Skin is warm and dry.  Psychiatric: He has a normal mood and affect. His behavior is normal. Judgment and thought content normal.    ED Course  Procedures (including critical care time) Labs Review Labs Reviewed  CBC WITH DIFFERENTIAL - Abnormal; Notable for the following:    HCT 38.9 (*)    Monocytes Relative 17 (*)    All other components within normal limits  COMPREHENSIVE METABOLIC PANEL - Abnormal; Notable for the following:    Potassium 3.3 (*)    Glucose, Bld 122 (*)    GFR calc non Af Amer 64 (*)    GFR calc Af Amer 75 (*)    All other components within normal limits  PROTIME-INR - Abnormal; Notable for the following:     Prothrombin Time 33.9 (*)    INR 3.51 (*)    All other components within normal limits  LIPASE, BLOOD   Imaging Review Dg Chest 2 View  07/08/2013   *RADIOLOGY REPORT*  Clinical Data: Cough  CHEST - 2 VIEW  Comparison: 11th 2009  Findings: The cardiac silhouette is normal in size and configuration.  The mediastinum is normal in contour caliber. There are no hilar masses.  The lungs are clear.  No pleural effusion or pneumothorax.  Mild elevation of left hemidiaphragm is stable.  The bony thorax is demineralized but grossly intact.  IMPRESSION: No acute cardiopulmonary disease.   Original Report Authenticated By: Amie Portland, M.D.   Dg Abd 1 View  07/08/2013   *RADIOLOGY REPORT*  Clinical Data: Abdominal distension.  Diarrhea.  ABDOMEN - 1 VIEW  Comparison: 09/29/2008.  Findings: There is a paucity of colonic gas, with only some distal sigmoid gas appreciated.  Marked dilatation of the small bowel is noted throughout the entire abdomen, with bowel loops measuring up to 6.4 cm in diameter.  No definite evidence of pneumoperitoneum is appreciated on today's supine films.  Sclerotic lesion in the right ilium is again noted, favored to represent a large bone island.  IMPRESSION: 1.  Bowel gas pattern, as above, concerning for small bowel obstruction. 2.  No frank pneumoperitoneum at this time.   Original Report Authenticated By: Trudie Reed, M.D.   Ct Abdomen Pelvis W Contrast  07/08/2013   *RADIOLOGY REPORT*  Clinical Data: Abdominal pain question small bowel obstruction on radiographs, past history of hypertension, small bowel obstruction, atrial fibrillation  CT ABDOMEN AND PELVIS WITH CONTRAST  Technique:  Multidetector CT imaging of the abdomen and pelvis was performed following the standard protocol during bolus administration of intravenous contrast. Sagittal and coronal MPR images reconstructed from axial data set.  Contrast: OMNIPAQUE IOHEXOL 300 MG/ML  SOLN Dilute oral contrast.   Comparison: 09/29/2008 Correlation:  Abdominal radiographs 07/08/2013  Findings: Lung bases clear. Dependent gallstones in gallbladder. Para esophageal herniation of fat with a small amount of accompanying fluid/edema. Liver, spleen, pancreas, and adrenal glands normal appearance. Symmetric nephrograms with small nonobstructing calculus and small cyst at inferior pole of left kidney.  Small supraumbilical ventral hernia containing fat and minimal fluid. Marked dilatation of proximal small bowel loops with decompressed distal small bowel loops compatible with small bowel obstruction. Transition zone is at the mid small bowel, left of midline in the mid abdomen axial image 44 question adhesion. No definite bowel wall thickening or perforation. Colon contains fluid but is otherwise decompressed.  No mass,  adenopathy, or free air. Unremarkable bladder with markedly enlarged prostate gland 7.5 x 7.4 x 7.1 cm. Right inguinal hernia containing fat. Scattered degenerative disc and facet disease changes of the lumbar spine with bilateral spondylolysis of L5 without significant spondylolisthesis. Scattered atherosclerotic calcifications aorta.  IMPRESSION: High-grade small bowel obstruction at the mid small bowel with transition zone in the left mid abdomen question adhesion. No definite evidence of wall thickening or perforation. Cholelithiasis. Marked prostatic enlargement. Tiny supraumbilical ventral hernia containing fat.   Original Report Authenticated By: Ulyses Southward, M.D.    MDM  No diagnosis found. Russell Dillon is a 67 y.o. male here with cough and diarrhea. Will get cxr to r/o pneumonia. Will do CT ab/pel to r/o SBO.   9:55 PM CT showed high grade SBO. I called Dr. Corliss Skains, who evaluated the patient and felt that he doesn't clinically have high grade SBO, just partial SBO given that he is passing gas. I called Dr. Waynard Edwards from [facility] medical, who will admit the patient.    Richardean Canal, MD 07/08/13  2156

## 2013-07-09 ENCOUNTER — Inpatient Hospital Stay (HOSPITAL_COMMUNITY): Payer: Medicare Other

## 2013-07-09 LAB — COMPREHENSIVE METABOLIC PANEL
BUN: 14 mg/dL (ref 6–23)
CO2: 23 mEq/L (ref 19–32)
Calcium: 8.6 mg/dL (ref 8.4–10.5)
Chloride: 102 mEq/L (ref 96–112)
Creatinine, Ser: 1.05 mg/dL (ref 0.50–1.35)
GFR calc Af Amer: 82 mL/min — ABNORMAL LOW (ref >90)
GFR calc non Af Amer: 70 mL/min — ABNORMAL LOW (ref >90)
Glucose, Bld: 133 mg/dL — ABNORMAL HIGH (ref 70–99)
Total Bilirubin: 0.6 mg/dL (ref 0.3–1.2)

## 2013-07-09 LAB — CBC WITH DIFFERENTIAL/PLATELET
Basophils Absolute: 0 10*3/uL (ref 0.0–0.1)
Basophils Relative: 1 % (ref 0–1)
HCT: 33.9 % — ABNORMAL LOW (ref 39.0–52.0)
Lymphocytes Relative: 14 % (ref 12–46)
MCHC: 34.5 g/dL (ref 30.0–36.0)
Monocytes Absolute: 0.7 10*3/uL (ref 0.1–1.0)
Neutro Abs: 1.5 10*3/uL — ABNORMAL LOW (ref 1.7–7.7)
Neutrophils Relative %: 57 % (ref 43–77)
Platelets: 207 10*3/uL (ref 150–400)
RDW: 14.4 % (ref 11.5–15.5)
WBC: 2.6 10*3/uL — ABNORMAL LOW (ref 4.0–10.5)

## 2013-07-09 LAB — MAGNESIUM: Magnesium: 1.4 mg/dL — ABNORMAL LOW (ref 1.5–2.5)

## 2013-07-09 LAB — PHOSPHORUS: Phosphorus: 2.3 mg/dL (ref 2.3–4.6)

## 2013-07-09 LAB — PROTIME-INR: Prothrombin Time: 36.1 seconds — ABNORMAL HIGH (ref 11.6–15.2)

## 2013-07-09 MED ORDER — DILTIAZEM HCL ER COATED BEADS 360 MG PO CP24
360.0000 mg | ORAL_CAPSULE | Freq: Every day | ORAL | Status: DC
  Administered 2013-07-09 (×2): 360 mg via ORAL
  Filled 2013-07-09 (×3): qty 1

## 2013-07-09 MED ORDER — ONDANSETRON HCL 4 MG/2ML IJ SOLN
4.0000 mg | Freq: Four times a day (QID) | INTRAMUSCULAR | Status: DC | PRN
  Administered 2013-07-09 – 2013-07-14 (×3): 4 mg via INTRAVENOUS
  Filled 2013-07-09: qty 8
  Filled 2013-07-09 (×3): qty 2

## 2013-07-09 MED ORDER — BENZONATATE 100 MG PO CAPS
100.0000 mg | ORAL_CAPSULE | Freq: Three times a day (TID) | ORAL | Status: DC | PRN
  Filled 2013-07-09: qty 1

## 2013-07-09 MED ORDER — ACETAMINOPHEN 650 MG RE SUPP: 650.0000 mg | Freq: Four times a day (QID) | RECTAL | Status: DC | PRN

## 2013-07-09 MED ORDER — ALBUTEROL SULFATE HFA 108 (90 BASE) MCG/ACT IN AERS
2.0000 | INHALATION_SPRAY | Freq: Four times a day (QID) | RESPIRATORY_TRACT | Status: DC
  Administered 2013-07-09 – 2013-07-10 (×3): 2 via RESPIRATORY_TRACT
  Filled 2013-07-09: qty 6.7

## 2013-07-09 MED ORDER — ONDANSETRON HCL 4 MG PO TABS
4.0000 mg | ORAL_TABLET | Freq: Four times a day (QID) | ORAL | Status: DC | PRN
  Administered 2013-07-10 (×2): 4 mg via ORAL
  Filled 2013-07-09 (×2): qty 1

## 2013-07-09 MED ORDER — SACCHAROMYCES BOULARDII 250 MG PO CAPS
250.0000 mg | ORAL_CAPSULE | Freq: Two times a day (BID) | ORAL | Status: DC
  Administered 2013-07-09 – 2013-07-10 (×4): 250 mg via ORAL
  Filled 2013-07-09 (×7): qty 1

## 2013-07-09 MED ORDER — HYDROCOD POLST-CHLORPHEN POLST 10-8 MG/5ML PO LQCR
5.0000 mL | Freq: Two times a day (BID) | ORAL | Status: DC | PRN
  Administered 2013-07-09 – 2013-07-19 (×5): 5 mL via ORAL
  Filled 2013-07-09 (×6): qty 5

## 2013-07-09 MED ORDER — ATORVASTATIN CALCIUM 10 MG PO TABS
10.0000 mg | ORAL_TABLET | Freq: Every day | ORAL | Status: DC
  Administered 2013-07-09 (×2): 10 mg via ORAL
  Filled 2013-07-09 (×4): qty 1

## 2013-07-09 MED ORDER — KCL IN DEXTROSE-NACL 20-5-0.45 MEQ/L-%-% IV SOLN
INTRAVENOUS | Status: DC
  Administered 2013-07-09 – 2013-07-10 (×3): via INTRAVENOUS
  Filled 2013-07-09 (×9): qty 1000

## 2013-07-09 MED ORDER — ACETAMINOPHEN 325 MG PO TABS
650.0000 mg | ORAL_TABLET | Freq: Four times a day (QID) | ORAL | Status: DC | PRN
  Administered 2013-07-14: 650 mg via ORAL
  Filled 2013-07-09: qty 2

## 2013-07-09 MED ORDER — FINASTERIDE 5 MG PO TABS
5.0000 mg | ORAL_TABLET | Freq: Every morning | ORAL | Status: DC
  Administered 2013-07-09 – 2013-07-10 (×2): 5 mg via ORAL
  Filled 2013-07-09 (×3): qty 1

## 2013-07-09 MED ORDER — DEXTROSE 5 % IV SOLN
500.0000 mg | INTRAVENOUS | Status: DC
  Administered 2013-07-09 – 2013-07-13 (×5): 500 mg via INTRAVENOUS
  Filled 2013-07-09 (×5): qty 500

## 2013-07-09 MED ORDER — LORATADINE 10 MG PO TABS
10.0000 mg | ORAL_TABLET | Freq: Every day | ORAL | Status: DC
  Administered 2013-07-09 – 2013-07-10 (×2): 10 mg via ORAL
  Filled 2013-07-09 (×3): qty 1

## 2013-07-09 NOTE — Progress Notes (Signed)
Brief Nutrition Note:   RD pulled to pt for positive malnutrition screening tool. Pt unsure if he has had unintentional weight loss. Weight hx reviewed, current weight is down 3 lbs from weight 1 month ago, prior to current acute illness. Weight loss not significant for time frame. Per notes pt was eating well until 3 days ago. Currently NPO and possible advance to CL.  Wt Readings from Last 5 Encounters:  07/09/13 198 lb 3.1 oz (89.9 kg)  06/01/13 201 lb (91.173 kg)  10/12/12 195 lb 12.8 oz (88.814 kg)  12/19/11 187 lb (84.823 kg)  06/04/11 193 lb (87.544 kg)   Body mass index is 25.79 kg/(m^2). overweight   No nutrition interventions warranted at this time. RD will continue to monitor for diet advance and if pt remains NPO or clear liquid diet X 7 days will make recommendations. Please consult if needed.   Clarene Duke RD, LDN Pager [phone] After Hours pager [phone]

## 2013-07-09 NOTE — Progress Notes (Signed)
I have seen and examined the pt and agree with PA-Osborne's progress note. Pt with wife that has had similar GI symptoms. Abd benign May try clears later today if con't doing well IVF

## 2013-07-09 NOTE — Progress Notes (Signed)
Patient ID: Russell Dillon, male   DOB: 1945/11/26, 67 y.o.   MRN: [id number]    Subjective: Pt feels ok.  Had some emesis overnight with cough medicine, but had a couple of bowel movements as well  Objective: Vital signs in last 24 hours: Temp:  [98 F (36.7 C)-99.7 F (37.6 C)] 98.9 F (37.2 C) (08/29 0521) Pulse Rate:  [83-102] 93 (08/29 0521) Resp:  [15-20] 18 (08/29 0521) BP: (106-128)/(50-72) 107/60 mmHg (08/29 0521) SpO2:  [95 %-99 %] 96 % (08/29 0521) Weight:  [198 lb 3.1 oz (89.9 kg)-201 lb 1 oz (91.2 kg)] 198 lb 3.1 oz (89.9 kg) (08/29 0521) Last BM Date: 07/09/13  Intake/Output from previous day:   Intake/Output this shift:    PE: Abd: soft, minimally distended, +BS, NT Heart: regular  Lab Results:   Recent Labs  07/08/13 1647 07/09/13 0450  WBC 4.7 2.6*  HGB 13.4 11.7*  HCT 38.9* 33.9*  PLT 227 207   BMET  Recent Labs  07/08/13 1647 07/09/13 0450  NA 136 136  K 3.3* 3.4*  CL 101 102  CO2 23 23  GLUCOSE 122* 133*  BUN 15 14  CREATININE 1.13 1.05  CALCIUM 9.2 8.6   PT/INR  Recent Labs  07/08/13 1924 07/09/13 0450  LABPROT 33.9* 36.1*  INR 3.51* 3.81*   CMP     Component Value Date/Time   NA 136 07/09/2013 0450   K 3.4* 07/09/2013 0450   CL 102 07/09/2013 0450   CO2 23 07/09/2013 0450   GLUCOSE 133* 07/09/2013 0450   BUN 14 07/09/2013 0450   CREATININE 1.05 07/09/2013 0450   CALCIUM 8.6 07/09/2013 0450   PROT 6.9 07/09/2013 0450   ALBUMIN 3.2* 07/09/2013 0450   AST 15 07/09/2013 0450   ALT 13 07/09/2013 0450   ALKPHOS 70 07/09/2013 0450   BILITOT 0.6 07/09/2013 0450   GFRNONAA 70* 07/09/2013 0450   GFRAA 82* 07/09/2013 0450   Lipase     Component Value Date/Time   LIPASE 12 07/08/2013 1647       Studies/Results: Dg Chest 2 View  07/08/2013   *RADIOLOGY REPORT*  Clinical Data: Cough  CHEST - 2 VIEW  Comparison: 11th 2009  Findings: The cardiac silhouette is normal in size and configuration.  The mediastinum is normal in contour  caliber. There are no hilar masses.  The lungs are clear.  No pleural effusion or pneumothorax.  Mild elevation of left hemidiaphragm is stable.  The bony thorax is demineralized but grossly intact.  IMPRESSION: No acute cardiopulmonary disease.   Original Report Authenticated By: Amie Portland, M.D.   Dg Abd 1 View  07/08/2013   *RADIOLOGY REPORT*  Clinical Data: Abdominal distension.  Diarrhea.  ABDOMEN - 1 VIEW  Comparison: 09/29/2008.  Findings: There is a paucity of colonic gas, with only some distal sigmoid gas appreciated.  Marked dilatation of the small bowel is noted throughout the entire abdomen, with bowel loops measuring up to 6.4 cm in diameter.  No definite evidence of pneumoperitoneum is appreciated on today's supine films.  Sclerotic lesion in the right ilium is again noted, favored to represent a large bone island.  IMPRESSION: 1.  Bowel gas pattern, as above, concerning for small bowel obstruction. 2.  No frank pneumoperitoneum at this time.   Original Report Authenticated By: Trudie Reed, M.D.   Ct Abdomen Pelvis W Contrast  07/08/2013   *RADIOLOGY REPORT*  Clinical Data: Abdominal pain question small bowel obstruction on radiographs, past history  of hypertension, small bowel obstruction, atrial fibrillation  CT ABDOMEN AND PELVIS WITH CONTRAST  Technique:  Multidetector CT imaging of the abdomen and pelvis was performed following the standard protocol during bolus administration of intravenous contrast. Sagittal and coronal MPR images reconstructed from axial data set.  Contrast: OMNIPAQUE IOHEXOL 300 MG/ML  SOLN Dilute oral contrast.  Comparison: 09/29/2008 Correlation:  Abdominal radiographs 07/08/2013  Findings: Lung bases clear. Dependent gallstones in gallbladder. Para esophageal herniation of fat with a small amount of accompanying fluid/edema. Liver, spleen, pancreas, and adrenal glands normal appearance. Symmetric nephrograms with small nonobstructing calculus and small  cyst at inferior pole of left kidney.  Small supraumbilical ventral hernia containing fat and minimal fluid. Marked dilatation of proximal small bowel loops with decompressed distal small bowel loops compatible with small bowel obstruction. Transition zone is at the mid small bowel, left of midline in the mid abdomen axial image 44 question adhesion. No definite bowel wall thickening or perforation. Colon contains fluid but is otherwise decompressed.  No mass, adenopathy, or free air. Unremarkable bladder with markedly enlarged prostate gland 7.5 x 7.4 x 7.1 cm. Right inguinal hernia containing fat. Scattered degenerative disc and facet disease changes of the lumbar spine with bilateral spondylolysis of L5 without significant spondylolisthesis. Scattered atherosclerotic calcifications aorta.  IMPRESSION: High-grade small bowel obstruction at the mid small bowel with transition zone in the left mid abdomen question adhesion. No definite evidence of wall thickening or perforation. Cholelithiasis. Marked prostatic enlargement. Tiny supraumbilical ventral hernia containing fat.   Original Report Authenticated By: Ulyses Southward, M.D.    Anti-infectives: Anti-infectives   Start     Dose/Rate Route Frequency Ordered Stop   07/09/13 0046  azithromycin (ZITHROMAX) 500 mg in dextrose 5 % 250 mL IVPB     500 mg 250 mL/hr over 60 Minutes Intravenous Every 24 hours 07/09/13 0047         Assessment/Plan  1. Gastroenteritis vs PSBO  Plan: 1. Patient had some emesis overnight,but still had a couple of BMs as well.  We will leave him NPO and recheck him later today.  If he is doing ok then we will consider clear liquids.  Repeat films today, will follow these. 2. Unclear whether this is GE vs PSBO.  Wife recently sick with GE.    LOS: 1 day    Delancey Moraes E 07/09/2013, 8:35 AM Pager: [phone]

## 2013-07-09 NOTE — Progress Notes (Signed)
Subjective: History reviewed with patient- 5 day history of cough and diarrhea.  History of spontaneous SBO ~5 years ago at which time he had more pain, bloating and no BM.  He has had none of those symptoms this time.    Objective: Vital signs in last 24 hours: Temp:  [98 F (36.7 C)-99.7 F (37.6 C)] 98.9 F (37.2 C) (08/29 0521) Pulse Rate:  [83-102] 93 (08/29 0521) Resp:  [15-20] 18 (08/29 0521) BP: (106-128)/(50-72) 107/60 mmHg (08/29 0521) SpO2:  [95 %-99 %] 96 % (08/29 0521) Weight:  [89.9 kg (198 lb 3.1 oz)-91.2 kg (201 lb 1 oz)] 89.9 kg (198 lb 3.1 oz) (08/29 0521) Weight change:  Last BM Date: 07/08/13  CBG (last 3)  No results found for this basename: GLUCAP,  in the last 72 hours  Intake/Output from previous day:   Intake/Output this shift:    General appearance: alert and no distress Eyes: no scleral icterus Throat: oropharynx moist without erythema Resp: clear to auscultation bilaterally Cardio: regular rate and rhythm GI: soft, non-tender; bowel sounds hypoactive but present Extremities: no clubbing, cyanosis or edema   Lab Results:  Recent Labs  07/08/13 1647 07/09/13 0450  NA 136 136  K 3.3* 3.4*  CL 101 102  CO2 23 23  GLUCOSE 122* 133*  BUN 15 14  CREATININE 1.13 1.05  CALCIUM 9.2 8.6  MG  --  1.4*  PHOS  --  2.3    Recent Labs  07/08/13 1647 07/09/13 0450  AST 18 15  ALT 15 13  ALKPHOS 86 70  BILITOT 0.6 0.6  PROT 8.1 6.9  ALBUMIN 3.8 3.2*    Recent Labs  07/08/13 1647 07/09/13 0450  WBC 4.7 2.6*  NEUTROABS 3.0 1.5*  HGB 13.4 11.7*  HCT 38.9* 33.9*  MCV 86.4 85.8  PLT 227 207   Lab Results  Component Value Date   INR 3.81* 07/09/2013   INR 3.51* 07/08/2013   INR 3.1 06/21/2013    Studies/Results: Dg Chest 2 View  07/08/2013   *RADIOLOGY REPORT*  Clinical Data: Cough  CHEST - 2 VIEW  Comparison: 11th 2009  Findings: The cardiac silhouette is normal in size and configuration.  The mediastinum is normal in contour  caliber. There are no hilar masses.  The lungs are clear.  No pleural effusion or pneumothorax.  Mild elevation of left hemidiaphragm is stable.  The bony thorax is demineralized but grossly intact.  IMPRESSION: No acute cardiopulmonary disease.   Original Report Authenticated By: Amie Portland, M.D.   Dg Abd 1 View  07/08/2013   *RADIOLOGY REPORT*  Clinical Data: Abdominal distension.  Diarrhea.  ABDOMEN - 1 VIEW  Comparison: 09/29/2008.  Findings: There is a paucity of colonic gas, with only some distal sigmoid gas appreciated.  Marked dilatation of the small bowel is noted throughout the entire abdomen, with bowel loops measuring up to 6.4 cm in diameter.  No definite evidence of pneumoperitoneum is appreciated on today's supine films.  Sclerotic lesion in the right ilium is again noted, favored to represent a large bone island.  IMPRESSION: 1.  Bowel gas pattern, as above, concerning for small bowel obstruction. 2.  No frank pneumoperitoneum at this time.   Original Report Authenticated By: Trudie Reed, M.D.   Ct Abdomen Pelvis W Contrast  07/08/2013   *RADIOLOGY REPORT*  Clinical Data: Abdominal pain question small bowel obstruction on radiographs, past history of hypertension, small bowel obstruction, atrial fibrillation  CT ABDOMEN AND PELVIS WITH CONTRAST  Technique:  Multidetector CT imaging of the abdomen and pelvis was performed following the standard protocol during bolus administration of intravenous contrast. Sagittal and coronal MPR images reconstructed from axial data set.  Contrast: OMNIPAQUE IOHEXOL 300 MG/ML  SOLN Dilute oral contrast.  Comparison: 09/29/2008 Correlation:  Abdominal radiographs 07/08/2013  Findings: Lung bases clear. Dependent gallstones in gallbladder. Para esophageal herniation of fat with a small amount of accompanying fluid/edema. Liver, spleen, pancreas, and adrenal glands normal appearance. Symmetric nephrograms with small nonobstructing calculus and small  cyst at inferior pole of left kidney.  Small supraumbilical ventral hernia containing fat and minimal fluid. Marked dilatation of proximal small bowel loops with decompressed distal small bowel loops compatible with small bowel obstruction. Transition zone is at the mid small bowel, left of midline in the mid abdomen axial image 44 question adhesion. No definite bowel wall thickening or perforation. Colon contains fluid but is otherwise decompressed.  No mass, adenopathy, or free air. Unremarkable bladder with markedly enlarged prostate gland 7.5 x 7.4 x 7.1 cm. Right inguinal hernia containing fat. Scattered degenerative disc and facet disease changes of the lumbar spine with bilateral spondylolysis of L5 without significant spondylolisthesis. Scattered atherosclerotic calcifications aorta.  IMPRESSION: High-grade small bowel obstruction at the mid small bowel with transition zone in the left mid abdomen question adhesion. No definite evidence of wall thickening or perforation. Cholelithiasis. Marked prostatic enlargement. Tiny supraumbilical ventral hernia containing fat.   Original Report Authenticated By: Ulyses Southward, M.D.     Medications: Scheduled: . atorvastatin  10 mg Oral QHS  . azithromycin  500 mg Intravenous Q24H  . diltiazem  360 mg Oral QHS  . finasteride  5 mg Oral q morning - 10a  . saccharomyces boulardii  250 mg Oral BID   Continuous: . dextrose 5 % and 0.45 % NaCl with KCl 20 mEq/L 100 mL/hr at 07/09/13 0145    Assessment/Plan: Principal Problem: 1. Small bowel obstruction- his history/exam is atypical for bowel obstruction given diarrhea and lack of significant distention.  Hypoactive bowel sounds are present.  Suspect viral gastroenteritis.  Will repeat KUB today to monitor bowel gas pattern.  Will start clear liquids and monitor clinically.   Active Problems: 2. Cough- suspect viral vs. Allergic.  Will continue Azithromycin and add Claritin, tessalon and Albuterol. 3. Atrial  fibrillation- continue Cardizem and anticoagulation- Coumadin on hold with supratherapeutic INR.  Resume Coumadin if SBO resolving.   4. HTN (hypertension)- controlled on cardizem. 5. Hyperlipidemia- continue lipitor. 6. Disposition- discharge to home in 1-2 days if SBO resolving and tolerating diet.   LOS: 1 day   Mindy Behnken,W DOUGLAS 07/09/2013, 7:36 AM

## 2013-07-09 NOTE — Care Management Note (Signed)
    Page 1 of 1   07/20/2013     2:21:42 PM   CARE MANAGEMENT NOTE 07/20/2013  Patient:  Russell Dillon, Russell Dillon   Account Number:  [id number]  Date Initiated:  07/09/2013  Documentation initiated by:  Reshaun Briseno  Subjective/Objective Assessment:   PT ADM ON 07/08/13 WITH SBO.  PTA, PT INDEPENDENT, LIVES WITH SPOUSE.     Action/Plan:   WILL FOLLOW FOR DISCHARGE NEEDS AS PT PROGRESSES.   Anticipated DC Date:  07/12/2013   Anticipated DC Plan:  HOME/SELF CARE      DC Planning Services  CM consult      Choice offered to / List presented to:             Status of service:  Completed, signed off Medicare Important Message given?   (If response is "NO", the following Medicare IM given date fields will be blank) Date Medicare IM given:   Date Additional Medicare IM given:    Discharge Disposition:  HOME/SELF CARE  Per UR Regulation:  Reviewed for med. necessity/level of care/duration of stay  If discussed at Long Length of Stay Meetings, dates discussed:   07/20/2013    Comments:  07/20/13 Vonte Rossin,RN,BSN [phone] PT FOR DC HOME TODAY WITH SPOUSE.  PT TOLERATING SOFT DIET; HAVING BMS.

## 2013-07-09 NOTE — Progress Notes (Signed)
Utilization Review Completed.Russell Dillon T8/29/2014  

## 2013-07-10 LAB — CBC WITH DIFFERENTIAL/PLATELET
Eosinophils Relative: 3 % (ref 0–5)
HCT: 34.8 % — ABNORMAL LOW (ref 39.0–52.0)
Hemoglobin: 11.6 g/dL — ABNORMAL LOW (ref 13.0–17.0)
Lymphocytes Relative: 19 % (ref 12–46)
MCV: 86.4 fL (ref 78.0–100.0)
Monocytes Absolute: 1 10*3/uL (ref 0.1–1.0)
Monocytes Relative: 19 % — ABNORMAL HIGH (ref 3–12)
Neutro Abs: 2.9 10*3/uL (ref 1.7–7.7)
WBC: 4.9 10*3/uL (ref 4.0–10.5)

## 2013-07-10 LAB — COMPREHENSIVE METABOLIC PANEL
ALT: 12 U/L (ref 0–53)
Albumin: 3 g/dL — ABNORMAL LOW (ref 3.5–5.2)
Calcium: 8.6 mg/dL (ref 8.4–10.5)
GFR calc Af Amer: 82 mL/min — ABNORMAL LOW (ref >90)
Glucose, Bld: 126 mg/dL — ABNORMAL HIGH (ref 70–99)
Potassium: 3.5 mEq/L (ref 3.5–5.1)
Sodium: 134 mEq/L — ABNORMAL LOW (ref 135–145)
Total Protein: 6.8 g/dL (ref 6.0–8.3)

## 2013-07-10 MED ORDER — DILTIAZEM HCL 90 MG PO TABS
90.0000 mg | ORAL_TABLET | Freq: Four times a day (QID) | ORAL | Status: DC
  Administered 2013-07-10 – 2013-07-11 (×4): 90 mg via ORAL
  Filled 2013-07-10 (×7): qty 1

## 2013-07-10 MED ORDER — LIDOCAINE VISCOUS 2 % MT SOLN
15.0000 mL | Freq: Once | OROMUCOSAL | Status: AC
  Administered 2013-07-10: 15 mL via OROMUCOSAL
  Filled 2013-07-10: qty 15

## 2013-07-10 MED ORDER — ALBUTEROL SULFATE HFA 108 (90 BASE) MCG/ACT IN AERS
2.0000 | INHALATION_SPRAY | Freq: Four times a day (QID) | RESPIRATORY_TRACT | Status: DC | PRN
  Filled 2013-07-10: qty 6.7

## 2013-07-10 MED ORDER — LIDOCAINE VISCOUS 2 % MT SOLN: 15.0000 mL | Freq: Once | OROMUCOSAL | Status: DC

## 2013-07-10 NOTE — Progress Notes (Signed)
Subjective: Feels a little better. Nausea is improving. Still having flatus and had a small loose bm yesterday  Objective: Vital signs in last 24 hours: Temp:  [97.2 F (36.2 C)-99.6 F (37.6 C)] 98.3 F (36.8 C) (08/30 0407) Pulse Rate:  [73-94] 73 (08/30 0407) Resp:  [17-18] 18 (08/30 0407) BP: (99-103)/(53-61) 99/61 mmHg (08/30 0407) SpO2:  [95 %-98 %] 97 % (08/30 0407) Weight:  [202 lb 2.6 oz (91.7 kg)] 202 lb 2.6 oz (91.7 kg) (08/30 0407) Last BM Date: 07/09/13  Intake/Output from previous day: 08/29 0701 - 08/30 0700 In: 1471 [P.O.:121; I.V.:1100; IV Piggyback:250] Out: 276 [Urine:275; Stool:1] Intake/Output this shift:    GI: soft, nontender. good bs  Lab Results:   Recent Labs  07/09/13 0450 07/10/13 0602  WBC 2.6* 4.9  HGB 11.7* 11.6*  HCT 33.9* 34.8*  PLT 207 177   BMET  Recent Labs  07/09/13 0450 07/10/13 0602  NA 136 134*  K 3.4* 3.5  CL 102 101  CO2 23 23  GLUCOSE 133* 126*  BUN 14 15  CREATININE 1.05 1.05  CALCIUM 8.6 8.6   PT/INR  Recent Labs  07/09/13 0450 07/10/13 0602  LABPROT 36.1* 39.5*  INR 3.81* 4.29*   ABG No results found for this basename: PHART, PCO2, PO2, HCO3,  in the last 72 hours  Studies/Results: Dg Chest 2 View  07/08/2013   *RADIOLOGY REPORT*  Clinical Data: Cough  CHEST - 2 VIEW  Comparison: 11th 2009  Findings: The cardiac silhouette is normal in size and configuration.  The mediastinum is normal in contour caliber. There are no hilar masses.  The lungs are clear.  No pleural effusion or pneumothorax.  Mild elevation of left hemidiaphragm is stable.  The bony thorax is demineralized but grossly intact.  IMPRESSION: No acute cardiopulmonary disease.   Original Report Authenticated By: Amie Portland, M.D.   Dg Abd 1 View  07/08/2013   *RADIOLOGY REPORT*  Clinical Data: Abdominal distension.  Diarrhea.  ABDOMEN - 1 VIEW  Comparison: 09/29/2008.  Findings: There is a paucity of colonic gas, with only some distal  sigmoid gas appreciated.  Marked dilatation of the small bowel is noted throughout the entire abdomen, with bowel loops measuring up to 6.4 cm in diameter.  No definite evidence of pneumoperitoneum is appreciated on today's supine films.  Sclerotic lesion in the right ilium is again noted, favored to represent a large bone island.  IMPRESSION: 1.  Bowel gas pattern, as above, concerning for small bowel obstruction. 2.  No frank pneumoperitoneum at this time.   Original Report Authenticated By: Trudie Reed, M.D.   Ct Abdomen Pelvis W Contrast  07/08/2013   *RADIOLOGY REPORT*  Clinical Data: Abdominal pain question small bowel obstruction on radiographs, past history of hypertension, small bowel obstruction, atrial fibrillation  CT ABDOMEN AND PELVIS WITH CONTRAST  Technique:  Multidetector CT imaging of the abdomen and pelvis was performed following the standard protocol during bolus administration of intravenous contrast. Sagittal and coronal MPR images reconstructed from axial data set.  Contrast: OMNIPAQUE IOHEXOL 300 MG/ML  SOLN Dilute oral contrast.  Comparison: 09/29/2008 Correlation:  Abdominal radiographs 07/08/2013  Findings: Lung bases clear. Dependent gallstones in gallbladder. Para esophageal herniation of fat with a small amount of accompanying fluid/edema. Liver, spleen, pancreas, and adrenal glands normal appearance. Symmetric nephrograms with small nonobstructing calculus and small cyst at inferior pole of left kidney.  Small supraumbilical ventral hernia containing fat and minimal fluid. Marked dilatation of proximal small  bowel loops with decompressed distal small bowel loops compatible with small bowel obstruction. Transition zone is at the mid small bowel, left of midline in the mid abdomen axial image 44 question adhesion. No definite bowel wall thickening or perforation. Colon contains fluid but is otherwise decompressed.  No mass, adenopathy, or free air. Unremarkable bladder with  markedly enlarged prostate gland 7.5 x 7.4 x 7.1 cm. Right inguinal hernia containing fat. Scattered degenerative disc and facet disease changes of the lumbar spine with bilateral spondylolysis of L5 without significant spondylolisthesis. Scattered atherosclerotic calcifications aorta.  IMPRESSION: High-grade small bowel obstruction at the mid small bowel with transition zone in the left mid abdomen question adhesion. No definite evidence of wall thickening or perforation. Cholelithiasis. Marked prostatic enlargement. Tiny supraumbilical ventral hernia containing fat.   Original Report Authenticated By: Ulyses Southward, M.D.   Dg Abd 2 Views  07/09/2013   *RADIOLOGY REPORT*  Clinical Data: Small bowel obstruction.  ABDOMEN - 2 VIEW  Comparison: Multiple priors, most recent CT 07/08/2013 to the most recent abdominal x-ray 07/08/2013  Findings: Again seen are multiple dilated loops of small bowel with air fluid levels present. The small bowel measures up to 6.9 cm in diameter.  There is gas in the colon, primarily in the ascending portion. The bladder contains contrast material. Visualized lung bases are clear.  No evidence of pneumoperitoneum.  No acute osseous abnormality.  IMPRESSION: 1. Continued evidence of small bowel obstruction, as above.  Gas now present in the colon, primarily in the ascending portion.  2. No evidence of pneumoperitoneum.   Original Report Authenticated By: Jerene Dilling, M.D.    Anti-infectives: Anti-infectives   Start     Dose/Rate Route Frequency Ordered Stop   07/09/13 0046  azithromycin (ZITHROMAX) 500 mg in dextrose 5 % 250 mL IVPB     500 mg 250 mL/hr over 60 Minutes Intravenous Every 24 hours 07/09/13 0047        Assessment/Plan: s/p * No surgery found * Continue clears. Will check abdominal xrays tomorrow  LOS: 2 days    TOTH III,PAUL S 07/10/2013

## 2013-07-10 NOTE — Progress Notes (Signed)
Pt ambulated in hallway independently 550 ft. Will continue to monitor.

## 2013-07-10 NOTE — Progress Notes (Signed)
After use of lidocaine to numb left nare  NGT was placed without difficulty to low intermittent suction. 2400 cc dark brown fluid out once NG placed. Will continue to monitor.

## 2013-07-10 NOTE — ED Provider Notes (Signed)
Medical screening examination/treatment/procedure(s) were performed by a resident physician or non-physician practitioner and as the supervising physician I was immediately available for consultation/collaboration.  Clementeen Graham, MD   Rodolph Bong, MD 07/10/13 [phone]

## 2013-07-10 NOTE — Progress Notes (Signed)
MD paged and aware, Pt c/o increased abdomen distention, bloating, and nausea after eating clear liquids for lunch. Pt given Zofran for nausea. Will continue to monitor.

## 2013-07-10 NOTE — Progress Notes (Signed)
Subjective: No events overnight.  Pt ate a full liquid meal this AM, first food in days.. No gas or BMs overnight Stable abd discomfort, no progression  Abd series yesterday showed cont presence of SBO  Objective: Vital signs in last 24 hours: Temp:  [97.2 F (36.2 C)-99.6 F (37.6 C)] 98.3 F (36.8 C) (08/30 0407) Pulse Rate:  [73-94] 73 (08/30 0407) Resp:  [17-18] 18 (08/30 0407) BP: (99-103)/(53-61) 99/61 mmHg (08/30 0407) SpO2:  [95 %-98 %] 97 % (08/30 0407) Weight:  [202 lb 2.6 oz (91.7 kg)] 202 lb 2.6 oz (91.7 kg) (08/30 0407) Weight change: 1 lb 1.6 oz (0.5 kg) Last BM Date: 07/09/13  CBG (last 3)  No results found for this basename: GLUCAP,  in the last 72 hours  Intake/Output from previous day: 08/29 0701 - 08/30 0700 In: 1471 [P.O.:121; I.V.:1100; IV Piggyback:250] Out: 276 [Urine:275; Stool:1] Intake/Output this shift:    General appearance: alert and no distress  Eyes: no scleral icterus  Throat: oropharynx moist without erythema  Resp: clear to auscultation bilaterally  Cardio: regular rate and rhythm  GI: soft, non-tender; bowel sounds remain hypoactive but present  Extremities: no clubbing, cyanosis or edema    Lab Results:  Recent Labs  07/09/13 0450 07/10/13 0602  NA 136 134*  K 3.4* 3.5  CL 102 101  CO2 23 23  GLUCOSE 133* 126*  BUN 14 15  CREATININE 1.05 1.05  CALCIUM 8.6 8.6  MG 1.4*  --   PHOS 2.3  --     Recent Labs  07/09/13 0450 07/10/13 0602  AST 15 12  ALT 13 12  ALKPHOS 70 66  BILITOT 0.6 0.4  PROT 6.9 6.8  ALBUMIN 3.2* 3.0*    Recent Labs  07/09/13 0450 07/10/13 0602  WBC 2.6* 4.9  NEUTROABS 1.5* 2.9  HGB 11.7* 11.6*  HCT 33.9* 34.8*  MCV 85.8 86.4  PLT 207 177   Lab Results  Component Value Date   INR 4.29* 07/10/2013   INR 3.81* 07/09/2013   INR 3.51* 07/08/2013    Studies/Results: Dg Chest 2 View  07/08/2013   *RADIOLOGY REPORT*  Clinical Data: Cough  CHEST - 2 VIEW  Comparison: 11th 2009  Findings:  The cardiac silhouette is normal in size and configuration.  The mediastinum is normal in contour caliber. There are no hilar masses.  The lungs are clear.  No pleural effusion or pneumothorax.  Mild elevation of left hemidiaphragm is stable.  The bony thorax is demineralized but grossly intact.  IMPRESSION: No acute cardiopulmonary disease.   Original Report Authenticated By: Amie Portland, M.D.   Dg Abd 1 View  07/08/2013   *RADIOLOGY REPORT*  Clinical Data: Abdominal distension.  Diarrhea.  ABDOMEN - 1 VIEW  Comparison: 09/29/2008.  Findings: There is a paucity of colonic gas, with only some distal sigmoid gas appreciated.  Marked dilatation of the small bowel is noted throughout the entire abdomen, with bowel loops measuring up to 6.4 cm in diameter.  No definite evidence of pneumoperitoneum is appreciated on today's supine films.  Sclerotic lesion in the right ilium is again noted, favored to represent a large bone island.  IMPRESSION: 1.  Bowel gas pattern, as above, concerning for small bowel obstruction. 2.  No frank pneumoperitoneum at this time.   Original Report Authenticated By: Trudie Reed, M.D.   Ct Abdomen Pelvis W Contrast  07/08/2013   *RADIOLOGY REPORT*  Clinical Data: Abdominal pain question small bowel obstruction on radiographs, past history  of hypertension, small bowel obstruction, atrial fibrillation  CT ABDOMEN AND PELVIS WITH CONTRAST  Technique:  Multidetector CT imaging of the abdomen and pelvis was performed following the standard protocol during bolus administration of intravenous contrast. Sagittal and coronal MPR images reconstructed from axial data set.  Contrast: OMNIPAQUE IOHEXOL 300 MG/ML  SOLN Dilute oral contrast.  Comparison: 09/29/2008 Correlation:  Abdominal radiographs 07/08/2013  Findings: Lung bases clear. Dependent gallstones in gallbladder. Para esophageal herniation of fat with a small amount of accompanying fluid/edema. Liver, spleen, pancreas, and  adrenal glands normal appearance. Symmetric nephrograms with small nonobstructing calculus and small cyst at inferior pole of left kidney.  Small supraumbilical ventral hernia containing fat and minimal fluid. Marked dilatation of proximal small bowel loops with decompressed distal small bowel loops compatible with small bowel obstruction. Transition zone is at the mid small bowel, left of midline in the mid abdomen axial image 44 question adhesion. No definite bowel wall thickening or perforation. Colon contains fluid but is otherwise decompressed.  No mass, adenopathy, or free air. Unremarkable bladder with markedly enlarged prostate gland 7.5 x 7.4 x 7.1 cm. Right inguinal hernia containing fat. Scattered degenerative disc and facet disease changes of the lumbar spine with bilateral spondylolysis of L5 without significant spondylolisthesis. Scattered atherosclerotic calcifications aorta.  IMPRESSION: High-grade small bowel obstruction at the mid small bowel with transition zone in the left mid abdomen question adhesion. No definite evidence of wall thickening or perforation. Cholelithiasis. Marked prostatic enlargement. Tiny supraumbilical ventral hernia containing fat.   Original Report Authenticated By: Ulyses Southward, M.D.   Dg Abd 2 Views  07/09/2013   *RADIOLOGY REPORT*  Clinical Data: Small bowel obstruction.  ABDOMEN - 2 VIEW  Comparison: Multiple priors, most recent CT 07/08/2013 to the most recent abdominal x-ray 07/08/2013  Findings: Again seen are multiple dilated loops of small bowel with air fluid levels present. The small bowel measures up to 6.9 cm in diameter.  There is gas in the colon, primarily in the ascending portion. The bladder contains contrast material. Visualized lung bases are clear.  No evidence of pneumoperitoneum.  No acute osseous abnormality.  IMPRESSION: 1. Continued evidence of small bowel obstruction, as above.  Gas now present in the colon, primarily in the ascending portion.   2. No evidence of pneumoperitoneum.   Original Report Authenticated By: Jerene Dilling, M.D.     Medications: Scheduled: . albuterol  2 puff Inhalation Q6H  . atorvastatin  10 mg Oral QHS  . azithromycin  500 mg Intravenous Q24H  . diltiazem  360 mg Oral QHS  . finasteride  5 mg Oral q morning - 10a  . loratadine  10 mg Oral Daily  . saccharomyces boulardii  250 mg Oral BID   Continuous: . dextrose 5 % and 0.45 % NaCl with KCl 20 mEq/L 100 mL/hr at 07/09/13 2143    Assessment/Plan: Principal Problem: 1. Small bowel obstruction-gen surg following along. Tolerated clears this morning. Zero gas or BMs. Cont on clear diet, first timing eating was this AM. Advance diet per CCS based on serial imaging and clinical exam Active Problems: 2. Cough- on azithromycin, Claritin, tessalon and Albuterol. 3. Atrial fibrillation- continue Cardizem. Coumadin on hold with supratherapeutic INR still rising.  Resume Coumadin if SBO resolving after INR peaks.  4. HTN (hypertension)- controlled on cardizem. 5. Hyperlipidemia- continue lipitor. 6. Disposition- discharge to home in 1-2 days if SBO resolving and tolerating diet.   LOS: 2 days   Sequan Auxier 07/10/2013, 7:32  AM

## 2013-07-10 NOTE — Plan of Care (Signed)
Pt w/o flatus or BMs today. Worsening nausea and now emesis x 1 w/ clear diet. CT and abd series noting SBO this admisison. Will resume NPO status at this time and place NGT to intermittent suction for gastric decompression in hopes of symptom relief and facilitate resolution of SBO.   Alysia Penna, MD

## 2013-07-10 NOTE — Progress Notes (Signed)
MD paged and orders given. Pt vomited X 1 and still complains of intermittent nausea after Zofran. Will continue to monitor.

## 2013-07-10 NOTE — Progress Notes (Signed)
Pt ambulated in hallway 500 ft independently and tolerated activity well. Will continue to monitor.

## 2013-07-11 ENCOUNTER — Inpatient Hospital Stay (HOSPITAL_COMMUNITY): Payer: Medicare Other

## 2013-07-11 LAB — COMPREHENSIVE METABOLIC PANEL
ALT: 13 U/L (ref 0–53)
AST: 12 U/L (ref 0–37)
Alkaline Phosphatase: 71 U/L (ref 39–117)
CO2: 29 mEq/L (ref 19–32)
Calcium: 9.1 mg/dL (ref 8.4–10.5)
GFR calc non Af Amer: 83 mL/min — ABNORMAL LOW (ref >90)
Potassium: 3.6 mEq/L (ref 3.5–5.1)
Sodium: 134 mEq/L — ABNORMAL LOW (ref 135–145)
Total Protein: 7.2 g/dL (ref 6.0–8.3)

## 2013-07-11 LAB — CBC WITH DIFFERENTIAL/PLATELET
Basophils Relative: 0 % (ref 0–1)
Eosinophils Absolute: 0.1 10*3/uL (ref 0.0–0.7)
HCT: 37 % — ABNORMAL LOW (ref 39.0–52.0)
Hemoglobin: 12.7 g/dL — ABNORMAL LOW (ref 13.0–17.0)
MCH: 29.2 pg (ref 26.0–34.0)
MCHC: 34.3 g/dL (ref 30.0–36.0)
Monocytes Absolute: 1.2 10*3/uL — ABNORMAL HIGH (ref 0.1–1.0)
Monocytes Relative: 21 % — ABNORMAL HIGH (ref 3–12)

## 2013-07-11 MED ORDER — ATORVASTATIN CALCIUM 10 MG PO TABS
10.0000 mg | ORAL_TABLET | Freq: Every day | ORAL | Status: DC
  Administered 2013-07-11: 10 mg via NASOGASTRIC
  Filled 2013-07-11 (×2): qty 1

## 2013-07-11 MED ORDER — DILTIAZEM HCL 90 MG PO TABS
90.0000 mg | ORAL_TABLET | Freq: Four times a day (QID) | ORAL | Status: DC
  Administered 2013-07-11: 90 mg via ORAL
  Filled 2013-07-11 (×3): qty 1

## 2013-07-11 MED ORDER — SODIUM CHLORIDE 0.9 % IV SOLN
INTRAVENOUS | Status: DC
  Administered 2013-07-11 (×2): 100 mL/h via INTRAVENOUS

## 2013-07-11 MED ORDER — FINASTERIDE 5 MG PO TABS
5.0000 mg | ORAL_TABLET | Freq: Every morning | ORAL | Status: DC
  Administered 2013-07-11: 5 mg via ORAL
  Filled 2013-07-11 (×2): qty 1

## 2013-07-11 MED ORDER — DILTIAZEM HCL 90 MG PO TABS
90.0000 mg | ORAL_TABLET | Freq: Four times a day (QID) | ORAL | Status: DC
  Administered 2013-07-12 (×2): 90 mg via ORAL
  Filled 2013-07-11 (×6): qty 1

## 2013-07-11 NOTE — Progress Notes (Signed)
  Subjective: Ng suction overnight, he has passed some flatus, feels better this am  Objective: Vital signs in last 24 hours: Temp:  [98.8 F (37.1 C)-99.1 F (37.3 C)] 99.1 F (37.3 C) 08-10-23 0428) Pulse Rate:  [82-91] 84 Aug 10, 2023 0428) Resp:  [19-20] 20 08/10/2023 0428) BP: (106-117)/(66-72) 110/68 mmHg August 10, 2023 0428) SpO2:  [94 %-96 %] 95 % August 10, 2023 0428) Weight:  [199 lb 3.2 oz (90.357 kg)] 199 lb 3.2 oz (90.357 kg) 2023/08/10 0428) Last BM Date: 07/09/13  Intake/Output from previous day: 08/30 0701 - August 10, 2023 0700 In: 3790 [P.O.:1080; I.V.:2400; NG/GT:60; IV Piggyback:250] Out: 5550 [Urine:1050; Emesis/NG output:4500] Intake/Output this shift: Total I/O In: 0  Out: 300 [Urine:300]  General appearance: no distress GI: some bs, nontender, nondistended, midline incision well healed  Lab Results:   Recent Labs  07/10/13 0602 09-Aug-2013 0410  WBC 4.9 5.7  HGB 11.6* 12.7*  HCT 34.8* 37.0*  PLT 177 233   BMET  Recent Labs  07/10/13 0602 Aug 09, 2013 0410  NA 134* 134*  K 3.5 3.6  CL 101 96  CO2 23 29  GLUCOSE 126* 142*  BUN 15 15  CREATININE 1.05 0.96  CALCIUM 8.6 9.1   PT/INR  Recent Labs  07/10/13 0602 2013/08/09 0410  LABPROT 39.5* 43.3*  INR 4.29* 4.84*    Studies/Results: Dg Abd 2 Views  August 09, 2013   *RADIOLOGY REPORT*  Clinical Data: Abdominal pain  ABDOMEN - 2 VIEW  Comparison: 07/09/2013  Findings: Persistent small bowel dilatation is identified. Contrast material is noted within the colon from prior CT examination.  No definitive free air is seen.  The nasogastric catheter is now noted within the stomach.  IMPRESSION: Persistent small bowel dilatation.  No significant interval change is noted.   Original Report Authenticated By: Alcide Clever, M.D.   Dg Abd 2 Views  07/09/2013   *RADIOLOGY REPORT*  Clinical Data: Small bowel obstruction.  ABDOMEN - 2 VIEW  Comparison: Multiple priors, most recent CT 07/08/2013 to the most recent abdominal x-ray 07/08/2013  Findings:  Again seen are multiple dilated loops of small bowel with air fluid levels present. The small bowel measures up to 6.9 cm in diameter.  There is gas in the colon, primarily in the ascending portion. The bladder contains contrast material. Visualized lung bases are clear.  No evidence of pneumoperitoneum.  No acute osseous abnormality.  IMPRESSION: 1. Continued evidence of small bowel obstruction, as above.  Gas now present in the colon, primarily in the ascending portion.  2. No evidence of pneumoperitoneum.   Original Report Authenticated By: Jerene Dilling, M.D.    Anti-infectives: Anti-infectives   Start     Dose/Rate Route Frequency Ordered Stop   07/09/13 0046  azithromycin (ZITHROMAX) 500 mg in dextrose 5 % 250 mL IVPB     500 mg 250 mL/hr over 60 Minutes Intravenous Every 24 hours 07/09/13 0047        Assessment/Plan: pSBO  He should resolve this conservatively given passage of flatus and contrast moving to his colon on plain film. He still however has dilation and leaving ng to suction another 24 hours with repeat films in am is best plan.  Allow inr to come down.  There is still possibility he may need surgery but hopefully will resolve on own.    [facility] 2013-08-09

## 2013-07-11 NOTE — Progress Notes (Signed)
Subjective: NGT to intermittent suction placed last night 2/2 persistent nausea, bloating, and emesis on clears.  NGT placed w/o difficulty.  No signs of bleeding Did pass gas a few times over past 24 hours No BMs  Not complaining of any further distension, bloating or nausea since NGT placed   Objective: Vital signs in last 24 hours: Temp:  [98.8 F (37.1 C)-99.1 F (37.3 C)] 99.1 F (37.3 C) 08-10-23 0428) Pulse Rate:  [82-91] 84 10-Aug-2023 0428) Resp:  [19-20] 20 2023-08-10 0428) BP: (106-117)/(66-72) 110/68 mmHg 2023/08/10 0428) SpO2:  [94 %-96 %] 95 % 08/10/23 0428) Weight:  [199 lb 3.2 oz (90.357 kg)] 199 lb 3.2 oz (90.357 kg) August 10, 2023 0428) Weight change: -2 lb 15.4 oz (-1.343 kg) Last BM Date: 07/09/13  CBG (last 3)  No results found for this basename: GLUCAP,  in the last 72 hours  Intake/Output from previous day: 08/30 0701 - 2023/08/10 0700 In: 3790 [P.O.:1080; I.V.:2400; NG/GT:60; IV Piggyback:250] Out: 5550 [Urine:1050; Emesis/NG output:4500] Intake/Output this shift: Total I/O In: 0  Out: 300 [Urine:300]  General appearance: alert and no distress Nose: NGT in place w/ greenish color substance in the suction container.  Eyes: no scleral icterus  Throat: oropharynx moist without erythema  Resp: clear to auscultation bilaterally  Cardio: regular rate and rhythm  GI: soft, non-tender; bowel sounds remain hypoactive but present  Extremities: no clubbing, cyanosis or edema    Lab Results:  Recent Labs  07/09/13 0450 07/10/13 0602 08-09-13 0410  NA 136 134* 134*  K 3.4* 3.5 3.6  CL 102 101 96  CO2 23 23 29   GLUCOSE 133* 126* 142*  BUN 14 15 15   CREATININE 1.05 1.05 0.96  CALCIUM 8.6 8.6 9.1  MG 1.4*  --   --   PHOS 2.3  --   --     Recent Labs  07/10/13 0602 2013/08/09 0410  AST 12 12  ALT 12 13  ALKPHOS 66 71  BILITOT 0.4 0.4  PROT 6.8 7.2  ALBUMIN 3.0* 3.1*    Recent Labs  07/10/13 0602 08/09/13 0410  WBC 4.9 5.7  NEUTROABS 2.9 3.6  HGB 11.6* 12.7*   HCT 34.8* 37.0*  MCV 86.4 85.1  PLT 177 233   Lab Results  Component Value Date   INR 4.84* 08/09/2013   INR 4.29* 07/10/2013   INR 3.81* 07/09/2013    Studies/Results: Dg Abd 2 Views  Aug 09, 2013   *RADIOLOGY REPORT*  Clinical Data: Abdominal pain  ABDOMEN - 2 VIEW  Comparison: 07/09/2013  Findings: Persistent small bowel dilatation is identified. Contrast material is noted within the colon from prior CT examination.  No definitive free air is seen.  The nasogastric catheter is now noted within the stomach.  IMPRESSION: Persistent small bowel dilatation.  No significant interval change is noted.   Original Report Authenticated By: Alcide Clever, M.D.   Dg Abd 2 Views  07/09/2013   *RADIOLOGY REPORT*  Clinical Data: Small bowel obstruction.  ABDOMEN - 2 VIEW  Comparison: Multiple priors, most recent CT 07/08/2013 to the most recent abdominal x-ray 07/08/2013  Findings: Again seen are multiple dilated loops of small bowel with air fluid levels present. The small bowel measures up to 6.9 cm in diameter.  There is gas in the colon, primarily in the ascending portion. The bladder contains contrast material. Visualized lung bases are clear.  No evidence of pneumoperitoneum.  No acute osseous abnormality.  IMPRESSION: 1. Continued evidence of small bowel obstruction, as above.  Gas  now present in the colon, primarily in the ascending portion.  2. No evidence of pneumoperitoneum.   Original Report Authenticated By: Jerene Dilling, M.D.     Medications: Scheduled: . atorvastatin  10 mg Per NG tube QHS  . azithromycin  500 mg Intravenous Q24H  . diltiazem  90 mg Oral Q6H  . finasteride  5 mg Oral q morning - 10a   Continuous: . sodium chloride      Assessment/Plan: Principal Problem: 1. Small bowel obstruction-gen surg following along. NGT to suction now in place, day 1. Appreciate CCS recs. NS at 100cc/hr while NPO. Imaging from this AM shows no change in status of SBO over past 48 hours.    Active Problems: 2. Cough- on azithromycin IV day 4. Other meds held while NPO (claritin, etc)  3. Atrial fibrillation- continue Cardizem, changed to 90mg  q6hr to allow crushing and administration via NGT. Coumadin on hold with supratherapeutic. INR still rising, but HGB stable and no signs of bleed. Holding on Vit K/FFP at this time while remains asymptomatic.  Resume Coumadin  after INR trends < 3.  4. HTN (hypertension)- controlled on cardizem. 5. Hyperlipidemia- continue lipitor. 6. Disposition- pending SBO resolution. Likely 3-5 days   LOS: 3 days   Akshar Starnes 07/11/2013, 8:49 AM

## 2013-07-11 NOTE — Progress Notes (Signed)
07/11/2013 1830 Nursing note Pt. BP noted to be 97/66. Pt. Voicing concerns about low BP. Pt. Asymptomatic. Afib 89 on tele. Dr. Clelia Croft paged and made aware. Verbal orders received to hold PO Diltiazem for SBP less than 100. Orders enacted. 1800 dose held per orders. Pt. And wife updated on plan. Verbalized understanding.  Tarica Harl, Blanchard Kelch

## 2013-07-12 ENCOUNTER — Inpatient Hospital Stay (HOSPITAL_COMMUNITY): Payer: Medicare Other

## 2013-07-12 LAB — CBC WITH DIFFERENTIAL/PLATELET
Basophils Relative: 1 % (ref 0–1)
Hemoglobin: 11.8 g/dL — ABNORMAL LOW (ref 13.0–17.0)
Lymphs Abs: 1.3 10*3/uL (ref 0.7–4.0)
MCHC: 33.7 g/dL (ref 30.0–36.0)
Monocytes Relative: 16 % — ABNORMAL HIGH (ref 3–12)
Neutro Abs: 3.5 10*3/uL (ref 1.7–7.7)
Neutrophils Relative %: 60 % (ref 43–77)
RBC: 4.09 MIL/uL — ABNORMAL LOW (ref 4.22–5.81)

## 2013-07-12 LAB — COMPREHENSIVE METABOLIC PANEL
BUN: 17 mg/dL (ref 6–23)
CO2: 27 mEq/L (ref 19–32)
Calcium: 8.4 mg/dL (ref 8.4–10.5)
Creatinine, Ser: 0.98 mg/dL (ref 0.50–1.35)
GFR calc Af Amer: >90 mL/min (ref >90)
GFR calc non Af Amer: 82 mL/min — ABNORMAL LOW (ref >90)
Glucose, Bld: 96 mg/dL (ref 70–99)

## 2013-07-12 LAB — PROTIME-INR: INR: 5.35 (ref 0.00–1.49)

## 2013-07-12 MED ORDER — DILTIAZEM HCL 100 MG IV SOLR
10.0000 mg/h | INTRAVENOUS | Status: DC
  Administered 2013-07-12 – 2013-07-19 (×15): 10 mg/h via INTRAVENOUS
  Filled 2013-07-12 (×14): qty 100

## 2013-07-12 MED ORDER — VITAMIN K1 10 MG/ML IJ SOLN
5.0000 mg | Freq: Once | INTRAVENOUS | Status: AC
  Administered 2013-07-12: 5 mg via INTRAVENOUS
  Filled 2013-07-12: qty 0.5

## 2013-07-12 MED ORDER — POTASSIUM CL IN DEXTROSE 5% 20 MEQ/L IV SOLN
20.0000 meq | INTRAVENOUS | Status: DC
  Administered 2013-07-12 – 2013-07-14 (×4): 20 meq via INTRAVENOUS
  Filled 2013-07-12 (×7): qty 1000

## 2013-07-12 MED ORDER — POTASSIUM CHLORIDE 10 MEQ/100ML IV SOLN
10.0000 meq | INTRAVENOUS | Status: AC
  Administered 2013-07-12 (×4): 10 meq via INTRAVENOUS
  Filled 2013-07-12 (×4): qty 100

## 2013-07-12 NOTE — Progress Notes (Signed)
Pt ambulated around unit 550 ft with standby assist. No complaints of pain or SOB. Pt tolerated well. Ilean Skill, Coraima Tibbs R, RN

## 2013-07-12 NOTE — Progress Notes (Signed)
Pt ambulated twice around the unit with standby assistance. Pt tolerated very well.

## 2013-07-12 NOTE — Progress Notes (Signed)
  Subjective: Pt feels very good today. He has passed flatus and had 3 bm's overnight and this am  Objective: Vital signs in last 24 hours: Temp:  [97.7 F (36.5 C)-99.3 F (37.4 C)] 97.7 F (36.5 C) 08/04/23 0445) Pulse Rate:  [75-94] 75 08-04-2023 0606) Resp:  [18-21] 18 2023-08-04 0445) BP: (97-113)/(53-71) 103/53 mmHg 08/04/23 0606) SpO2:  [97 %-98 %] 97 % 2023/08/04 0445) Last BM Date: 07/11/13  Intake/Output from previous day: 08/31 0701 - 08/04/2023 0700 In: 40 [NG/GT:40] Out: 2500 [Urine:1600; Emesis/NG output:900] Intake/Output this shift:    GI: soft, nontender. not distended. good bs  Lab Results:   Recent Labs  07/11/13 0410 08-03-13 0420  WBC 5.7 5.9  HGB 12.7* 11.8*  HCT 37.0* 35.0*  PLT 233 227   BMET  Recent Labs  07/11/13 0410 08-03-2013 0420  NA 134* 137  K 3.6 3.2*  CL 96 101  CO2 29 27  GLUCOSE 142* 96  BUN 15 17  CREATININE 0.96 0.98  CALCIUM 9.1 8.4   PT/INR  Recent Labs  07/11/13 0410 08-03-13 0420  LABPROT 43.3* 46.8*  INR 4.84* 5.35*   ABG No results found for this basename: PHART, PCO2, PO2, HCO3,  in the last 72 hours  Studies/Results: Dg Abd 2 Views  2013/08/03   *RADIOLOGY REPORT*  Clinical Data: Small bowel obstruction.  ABDOMEN - 2 VIEW  Comparison: 07/11/2013  Findings: Nasogastric tube remains extending into the stomach. There remains dilatation of central small bowel loops up to a maximal caliber of the approximately 7 cm.  Overall appearance of small bowel dilatation is felt to be improved.  Some air and stool remains in the colon and findings are consistent with partial small bowel obstruction.  No free intraperitoneal air is seen.  IMPRESSION: Improving partial small bowel obstruction with some continued small bowel dilatation present.   Original Report Authenticated By: Irish Lack, M.D.   Dg Abd 2 Views  07/11/2013   *RADIOLOGY REPORT*  Clinical Data: Abdominal pain  ABDOMEN - 2 VIEW  Comparison: 07/09/2013  Findings: Persistent  small bowel dilatation is identified. Contrast material is noted within the colon from prior CT examination.  No definitive free air is seen.  The nasogastric catheter is now noted within the stomach.  IMPRESSION: Persistent small bowel dilatation.  No significant interval change is noted.   Original Report Authenticated By: Alcide Clever, M.D.    Anti-infectives: Anti-infectives   Start     Dose/Rate Route Frequency Ordered Stop   07/09/13 0046  azithromycin (ZITHROMAX) 500 mg in dextrose 5 % 250 mL IVPB     500 mg 250 mL/hr over 60 Minutes Intravenous Every 24 hours 07/09/13 0047        Assessment/Plan: s/p * No surgery found * Although his bowel function seems to be improving he still has some small bowel dilation on xray. his xrays are improving Will leave ng to suction another day and consider capping tomorrow  LOS: 4 days    TOTH III,Guneet Delpino S 08-03-13

## 2013-07-12 NOTE — Progress Notes (Signed)
CRITICAL VALUE ALERT  Critical value received:  5.35  Date of notification:  07/12/2013  Time of notification:  0545  Critical value read back:yes  Nurse who received alert:  Thane Edu  MD notified (1st page):  Yes  Time of first page:  [phone]  MD notified (2nd page):  Time of second page:  Responding MD:  Dr. Clelia Croft  Time MD responded:  [phone]

## 2013-07-12 NOTE — Progress Notes (Signed)
Subjective: Feels the best he has felt in several days.  3 semisolid BM after midnight.  Passing gas.  NG remains in place with bilious output.  Cough has resolved.  Objective: Vital signs in last 24 hours: Temp:  [97.7 F (36.5 C)-99.3 F (37.4 C)] 97.7 F (36.5 C) (09/01 0445) Pulse Rate:  [75-94] 75 (09/01 0606) Resp:  [18-21] 18 (09/01 0445) BP: (97-113)/(53-71) 103/53 mmHg (09/01 0606) SpO2:  [97 %-98 %] 97 % (09/01 0445) Weight change:  Last BM Date: 07/11/13  CBG (last 3)  No results found for this basename: GLUCAP,  in the last 72 hours  Intake/Output from previous day: 08/31 0701 - 09/01 0700 In: 40 [NG/GT:40] Out: 2500 [Urine:1600; Emesis/NG output:900] Intake/Output this shift:    General appearance: alert and no distress Eyes: no scleral icterus Throat: oropharynx moist without erythema Resp: clear to auscultation bilaterally Cardio: irregularly irregular rhythm GI: soft, non-tender; hypoactive bowel sounds throughout Extremities: no clubbing, cyanosis or edema   Lab Results:  Recent Labs  07/11/13 0410 07/12/13 0420  NA 134* 137  K 3.6 3.2*  CL 96 101  CO2 29 27  GLUCOSE 142* 96  BUN 15 17  CREATININE 0.96 0.98  CALCIUM 9.1 8.4    Recent Labs  07/11/13 0410 07/12/13 0420  AST 12 11  ALT 13 11  ALKPHOS 71 64  BILITOT 0.4 0.5  PROT 7.2 6.5  ALBUMIN 3.1* 2.8*    Recent Labs  07/11/13 0410 07/12/13 0420  WBC 5.7 5.9  NEUTROABS 3.6 3.5  HGB 12.7* 11.8*  HCT 37.0* 35.0*  MCV 85.1 85.6  PLT 233 227   Lab Results  Component Value Date   INR 5.35* 07/12/2013   INR 4.84* 07/11/2013   INR 4.29* 07/10/2013   No results found for this basename: CKTOTAL, CKMB, CKMBINDEX, TROPONINI,  in the last 72 hours No results found for this basename: TSH, T4TOTAL, FREET3, T3FREE, THYROIDAB,  in the last 72 hours No results found for this basename: VITAMINB12, FOLATE, FERRITIN, TIBC, IRON, RETICCTPCT,  in the last 72 hours  Studies/Results: Dg Abd 2  Views  07/11/2013   *RADIOLOGY REPORT*  Clinical Data: Abdominal pain  ABDOMEN - 2 VIEW  Comparison: 07/09/2013  Findings: Persistent small bowel dilatation is identified. Contrast material is noted within the colon from prior CT examination.  No definitive free air is seen.  The nasogastric catheter is now noted within the stomach.  IMPRESSION: Persistent small bowel dilatation.  No significant interval change is noted.   Original Report Authenticated By: Alcide Clever, M.D.     Medications: Scheduled: . atorvastatin  10 mg Per NG tube QHS  . azithromycin  500 mg Intravenous Q24H  . diltiazem  90 mg Oral Q6H  . finasteride  5 mg Oral q morning - 10a   Continuous: . sodium chloride 100 mL/hr (07/11/13 2027)    Assessment/Plan: Principal Problem: 1. Small bowel obstruction- progression of symptoms over the weekend requiring NG suction.  Improved with +flatus/BM but copious NG output.  Repeat Abdominal X-rays today.  Defer further management to general surgery.    Active Problems: 2. Atrial fibrillation- will change Cardizem to IV drip due to NPO status.  Will reverse INR with Vitamin K in the event that surgery is needed.  Resume Coumadin if no plans for surgery once INR <3.0  3. Hypokalemia- will give IV K to try to keep K>4.0.   4. HTN (hypertension)- change cardizem to IV.   5. Hyperlipidemia- hold  Lipitor while NPO. 6. Disposition- pending progress with #1.  Ambulate.    LOS: 4 days   SHAW,W DOUGLAS 07/12/2013, 9:04 AM

## 2013-07-12 NOTE — Progress Notes (Signed)
MD made aware of critical value INR.  Pt is asymptomatic.  No orders at this time, call light in reach, will continue to monitor.   Thane Edu, RN

## 2013-07-13 LAB — COMPREHENSIVE METABOLIC PANEL
ALT: 10 U/L (ref 0–53)
AST: 10 U/L (ref 0–37)
CO2: 28 mEq/L (ref 19–32)
Calcium: 8.7 mg/dL (ref 8.4–10.5)
GFR calc non Af Amer: 84 mL/min — ABNORMAL LOW (ref >90)
Sodium: 134 mEq/L — ABNORMAL LOW (ref 135–145)

## 2013-07-13 LAB — CBC WITH DIFFERENTIAL/PLATELET
Basophils Relative: 1 % (ref 0–1)
Eosinophils Absolute: 0.2 10*3/uL (ref 0.0–0.7)
HCT: 37.3 % — ABNORMAL LOW (ref 39.0–52.0)
Hemoglobin: 12.6 g/dL — ABNORMAL LOW (ref 13.0–17.0)
Lymphs Abs: 1.3 10*3/uL (ref 0.7–4.0)
MCH: 29.1 pg (ref 26.0–34.0)
MCHC: 33.8 g/dL (ref 30.0–36.0)
MCV: 86.1 fL (ref 78.0–100.0)
Monocytes Absolute: 1.3 10*3/uL — ABNORMAL HIGH (ref 0.1–1.0)
Neutro Abs: 5.5 10*3/uL (ref 1.7–7.7)

## 2013-07-13 MED ORDER — ACETAMINOPHEN 160 MG/5ML PO SOLN
650.0000 mg | Freq: Four times a day (QID) | ORAL | Status: DC | PRN
  Administered 2013-07-13 – 2013-07-14 (×2): 650 mg via ORAL
  Filled 2013-07-13 (×3): qty 20.3

## 2013-07-13 MED ORDER — WARFARIN - PHARMACIST DOSING INPATIENT
Freq: Every day | Status: DC
  Administered 2013-07-13 – 2013-07-14 (×2)

## 2013-07-13 MED ORDER — WARFARIN SODIUM 7.5 MG PO TABS
7.5000 mg | ORAL_TABLET | Freq: Once | ORAL | Status: AC
  Administered 2013-07-13: 7.5 mg via ORAL
  Filled 2013-07-13: qty 1

## 2013-07-13 MED ORDER — ENOXAPARIN SODIUM 100 MG/ML ~~LOC~~ SOLN
90.0000 mg | Freq: Two times a day (BID) | SUBCUTANEOUS | Status: DC
  Administered 2013-07-13 – 2013-07-14 (×4): 90 mg via SUBCUTANEOUS
  Filled 2013-07-13 (×6): qty 1

## 2013-07-13 NOTE — Progress Notes (Signed)
Subjective: Feeling better.  Multiple BM (~5) in past 24 hours.  NG tube output decreased.  No abdominal pain.  Objective: Vital signs in last 24 hours: Temp:  [97.8 F (36.6 C)-98.1 F (36.7 C)] 97.8 F (36.6 C) (09/02 0641) Pulse Rate:  [89-92] 92 (09/02 0641) Resp:  [18] 18 (09/02 0641) BP: (108-115)/(59-77) 108/59 mmHg (09/02 0641) SpO2:  [99 %-100 %] 100 % (09/02 0641) Weight change:  Last BM Date: 07/13/13  CBG (last 3)  No results found for this basename: GLUCAP,  in the last 72 hours  Intake/Output from previous day: 07/15/23 0701 - 09/02 0700 In: -  Out: 1500 [Urine:1500] Intake/Output this shift:    General appearance: alert and no distress Eyes: no scleral icterus Throat: oropharynx moist without erythema Resp: clear to auscultation bilaterally Cardio: irregularly irregular GI: soft, non-tender; bowel sounds normal; no masses,  no organomegaly Extremities: no clubbing, cyanosis or edema   Lab Results:  Recent Labs  07/14/13 0420 07/13/13 0450  NA 137 134*  K 3.2* 4.3  CL 101 98  CO2 27 28  GLUCOSE 96 115*  BUN 17 11  CREATININE 0.98 0.93  CALCIUM 8.4 8.7    Recent Labs  Jul 14, 2013 0420 07/13/13 0450  AST 11 10  ALT 11 10  ALKPHOS 64 72  BILITOT 0.5 0.7  PROT 6.5 7.0  ALBUMIN 2.8* 2.9*    Recent Labs  2013-07-14 0420 07/13/13 0450  WBC 5.9 8.4  NEUTROABS 3.5 5.5  HGB 11.8* 12.6*  HCT 35.0* 37.3*  MCV 85.6 86.1  PLT 227 233   Lab Results  Component Value Date   INR 1.42 07/13/2013   INR 5.35* 2013-07-14   INR 4.84* 07/11/2013   No results found for this basename: CKTOTAL, CKMB, CKMBINDEX, TROPONINI,  in the last 72 hours No results found for this basename: TSH, T4TOTAL, FREET3, T3FREE, THYROIDAB,  in the last 72 hours No results found for this basename: VITAMINB12, FOLATE, FERRITIN, TIBC, IRON, RETICCTPCT,  in the last 72 hours  Studies/Results: Dg Abd 2 Views  14-Jul-2013   *RADIOLOGY REPORT*  Clinical Data: Small bowel obstruction.   ABDOMEN - 2 VIEW  Comparison: 07/11/2013  Findings: Nasogastric tube remains extending into the stomach. There remains dilatation of central small bowel loops up to a maximal caliber of the approximately 7 cm.  Overall appearance of small bowel dilatation is felt to be improved.  Some air and stool remains in the colon and findings are consistent with partial small bowel obstruction.  No free intraperitoneal air is seen.  IMPRESSION: Improving partial small bowel obstruction with some continued small bowel dilatation present.   Original Report Authenticated By: Irish Lack, M.D.     Medications: Scheduled: . azithromycin  500 mg Intravenous Q24H   Continuous: . dextrose 5 % with KCl 20 mEq / L 20 mEq (07/13/13 0452)  . diltiazem (CARDIZEM) infusion 10 mg/hr (2013-07-14 2219)    Assessment/Plan: Principal Problem:  1. Small bowel obstruction- improving on Xray and clinically.  NG tube clamped.  Anticipate clear liquids tomorrow if stable.  Active Problems:  2. Atrial fibrillation- continue Cardizem to IV drip until tolerating pos.  Will give Lovenox until INR >2.0 3. Hypokalemia-improved. keep K>4.0.  4. Acute Bronchitis- discontinue Azithromycin.  Improved. 4. HTN (hypertension)-continue cardizem IV.  5. Hyperlipidemia- hold Lipitor while NPO.  6. Disposition- pending progress with #1. Ambulate.    LOS: 5 days   Russell Dillon,W DOUGLAS 07/13/2013, 9:31 AM

## 2013-07-13 NOTE — Consult Note (Signed)
ANTICOAGULATION CONSULT NOTE - Initial Consult  Pharmacy Consult for Coumadin + Lovenox Indication: atrial fibrillation  No Known Allergies  Patient Measurements: Height: 6' 1.5" (186.7 cm) Weight: 199 lb 3.2 oz (90.357 kg) IBW/kg (Calculated) : 81.05  Vital Signs: Temp: 97.8 F (36.6 C) (09/02 0641) Temp src: Oral (09/02 0641) BP: 108/59 mmHg (09/02 0641) Pulse Rate: 92 (09/02 0641)  Labs:  Recent Labs  07/11/13 0410 07/12/13 0420 07/13/13 0450  HGB 12.7* 11.8* 12.6*  HCT 37.0* 35.0* 37.3*  PLT 233 227 233  LABPROT 43.3* 46.8* 17.0*  INR 4.84* 5.35* 1.42  CREATININE 0.96 0.98 0.93    Estimated Creatinine Clearance: 86 ml/min (by C-G formula based on Cr of 0.93).   Medical History: Past Medical History  Diagnosis Date  . Chronic atrial fibrillation   . Hypertension   . Chronic anticoagulation   . Hyperlipidemia   . BPH (benign prostatic hypertrophy)   . Hx SBO   . Diastolic dysfunction October 2010    Normal LV systolic function  . Small bowel obstruction    Assessment: Russell Dillon on coumadin pta for afib (5mg  daily except 2.5mg  on Sundays). INR on admission was elevated at 3.51. INR continued to trend up despite no coumadin since 8/27. Reached as high as 5.35 yesterday and vitamin k 5mg  IV x 1 was given yesterday to reverse in case patient needed surgery for his partial SBO. INR today is now subtherapeutic at 1.4. No plans for surgery so coumadin to resume with lovenox bridge. Anticipate that patient will need higher doses of coumadin to overcome vitamin k resistance.  CBC is stable. Renal function is stable with CrCl 51ml/min. Weight = 90.4kg.  Goal of Therapy:  INR 2-3 Anti-Xa level 0.6-1.2 units/ml 4hrs after LMWH dose given Monitor platelets by anticoagulation protocol: Yes   Plan:  1) Coumadin 7.5mg  x 1 2) Lovenox 90mg  sq q12 3) Daily INR, CBC already ordered  [name] 07/13/2013,9:59 AM

## 2013-07-13 NOTE — Progress Notes (Signed)
Patient doing much better. Having several bowel movements.  May be able to remove NGT soon.  Russell Dillon. Gae Bon, MD, FACS [phone] [facility]

## 2013-07-13 NOTE — Progress Notes (Signed)
Patient ID: Russell Dillon, male   DOB: [birth year], 67 y.o.   MRN: [id number]    Subjective: Pt feels well this morning.  Had 4-6 good BMs yesterday.  NGT placed back to suction last night around 11pm after getting pills, has only had 100cc of output since then.    Objective: Vital signs in last 24 hours: Temp:  [97.8 F (36.6 C)-98.1 F (36.7 C)] 97.8 F (36.6 C) (09/02 0641) Pulse Rate:  [89-92] 92 (09/02 0641) Resp:  [18] 18 (09/02 0641) BP: (108-115)/(59-77) 108/59 mmHg (09/02 0641) SpO2:  [99 %-100 %] 100 % (09/02 0641) Last BM Date: 07-16-2013  Intake/Output from previous day: 07/17/23 0701 - 09/02 0700 In: -  Out: 1500 [Urine:1500] Intake/Output this shift:    PE: Abd: soft, NT, ND, +BS  Lab Results:   Recent Labs  2013/07/16 0420 07/13/13 0450  WBC 5.9 8.4  HGB 11.8* 12.6*  HCT 35.0* 37.3*  PLT 227 233   BMET  Recent Labs  16-Jul-2013 0420 07/13/13 0450  NA 137 134*  K 3.2* 4.3  CL 101 98  CO2 27 28  GLUCOSE 96 115*  BUN 17 11  CREATININE 0.98 0.93  CALCIUM 8.4 8.7   PT/INR  Recent Labs  07-16-2013 0420 07/13/13 0450  LABPROT 46.8* 17.0*  INR 5.35* 1.42   CMP     Component Value Date/Time   NA 134* 07/13/2013 0450   K 4.3 07/13/2013 0450   CL 98 07/13/2013 0450   CO2 28 07/13/2013 0450   GLUCOSE 115* 07/13/2013 0450   BUN 11 07/13/2013 0450   CREATININE 0.93 07/13/2013 0450   CALCIUM 8.7 07/13/2013 0450   PROT 7.0 07/13/2013 0450   ALBUMIN 2.9* 07/13/2013 0450   AST 10 07/13/2013 0450   ALT 10 07/13/2013 0450   ALKPHOS 72 07/13/2013 0450   BILITOT 0.7 07/13/2013 0450   GFRNONAA 84* 07/13/2013 0450   GFRAA >90 07/13/2013 0450   Lipase     Component Value Date/Time   LIPASE 12 07/08/2013 1647       Studies/Results: Dg Abd 2 Views  07/16/2013   *RADIOLOGY REPORT*  Clinical Data: Small bowel obstruction.  ABDOMEN - 2 VIEW  Comparison: 07/11/2013  Findings: Nasogastric tube remains extending into the stomach. There remains dilatation of central small bowel loops up  to a maximal caliber of the approximately 7 cm.  Overall appearance of small bowel dilatation is felt to be improved.  Some air and stool remains in the colon and findings are consistent with partial small bowel obstruction.  No free intraperitoneal air is seen.  IMPRESSION: Improving partial small bowel obstruction with some continued small bowel dilatation present.   Original Report Authenticated By: Irish Lack, M.D.    Anti-infectives: Anti-infectives   Start     Dose/Rate Route Frequency Ordered Stop   07/09/13 0046  azithromycin (ZITHROMAX) 500 mg in dextrose 5 % 250 mL IVPB     500 mg 250 mL/hr over 60 Minutes Intravenous Every 24 hours 07/09/13 0047         Assessment/Plan  1. PSBO  Plan: 1. Will clamp NGT today.  Would remove after 6 hrs, but patient request it be left in and clamped.  We will remove it tomorrow if he does ok with it clamped today.  He may have ice chips and sips of water if tolerates his tube clamped for 6 hrs today.   LOS: 5 days    Maxyne Derocher E 07/13/2013, 8:24 AM Pager: [phone]

## 2013-07-13 NOTE — Progress Notes (Signed)
Patient ambulated approximately 318ft with his wife.  Patient tolerated ambulation fair.  Patient returned to chair resting.  Vital signs remained stable. Will continue to monitor.

## 2013-07-14 LAB — CBC WITH DIFFERENTIAL/PLATELET
Basophils Absolute: 0 10*3/uL (ref 0.0–0.1)
Eosinophils Absolute: 0.1 10*3/uL (ref 0.0–0.7)
Lymphocytes Relative: 12 % (ref 12–46)
Lymphs Abs: 1.3 10*3/uL (ref 0.7–4.0)
MCHC: 34.3 g/dL (ref 30.0–36.0)
MCV: 84.4 fL (ref 78.0–100.0)
Monocytes Relative: 11 % (ref 3–12)
Platelets: 234 10*3/uL (ref 150–400)
RDW: 14.2 % (ref 11.5–15.5)
WBC: 10.9 10*3/uL — ABNORMAL HIGH (ref 4.0–10.5)

## 2013-07-14 LAB — COMPREHENSIVE METABOLIC PANEL
ALT: 10 U/L (ref 0–53)
Alkaline Phosphatase: 72 U/L (ref 39–117)
BUN: 12 mg/dL (ref 6–23)
Chloride: 93 mEq/L — ABNORMAL LOW (ref 96–112)
GFR calc Af Amer: 87 mL/min — ABNORMAL LOW (ref >90)
Glucose, Bld: 149 mg/dL — ABNORMAL HIGH (ref 70–99)
Potassium: 3.9 mEq/L (ref 3.5–5.1)
Total Bilirubin: 0.9 mg/dL (ref 0.3–1.2)

## 2013-07-14 LAB — PROTIME-INR
INR: 1.52 — ABNORMAL HIGH (ref 0.00–1.49)
Prothrombin Time: 17.9 seconds — ABNORMAL HIGH (ref 11.6–15.2)

## 2013-07-14 MED ORDER — POTASSIUM CHLORIDE 2 MEQ/ML IV SOLN
INTRAVENOUS | Status: DC
  Administered 2013-07-14 – 2013-07-15 (×3): via INTRAVENOUS
  Filled 2013-07-14 (×6): qty 1000

## 2013-07-14 MED ORDER — WARFARIN SODIUM 7.5 MG PO TABS
7.5000 mg | ORAL_TABLET | Freq: Once | ORAL | Status: AC
  Administered 2013-07-14: 7.5 mg via ORAL
  Filled 2013-07-14: qty 1

## 2013-07-14 NOTE — Progress Notes (Signed)
Patient ID: Russell Dillon, male   DOB: 1946/08/04, 67 y.o.   MRN: [id number]    Subjective: Pt feels well this morning.  Has had three bowel movements since midnight. Denies nausea, vomiting. No abdominal pain. Has tolerated clear liquids this morning.  Objective: Vital signs in last 24 hours: Temp:  [97.8 F (36.6 C)-98.6 F (37 C)] 98.6 F (37 C) (09/03 0458) Pulse Rate:  [91-99] 96 (09/03 0458) Resp:  [18] 18 (09/03 0458) BP: (98-119)/(63-76) 98/63 mmHg (09/03 0458) SpO2:  [97 %-100 %] 97 % (09/03 0458) Weight:  [195 lb 9.6 oz (88.724 kg)] 195 lb 9.6 oz (88.724 kg) (09/03 0458) Last BM Date: 07/13/13  Intake/Output from previous day: 09/02 0701 - 09/03 0700 In: 1320 [I.V.:1320] Out: 902 [Urine:900; Stool:2] Intake/Output this shift:    PE: Abd: soft, NT, ND, +BS. Hernia to R of midline just superior to umbilicus is nontender to palpation.  Lab Results:   Recent Labs  07/13/13 0450 07/14/13 0405  WBC 8.4 10.9*  HGB 12.6* 12.6*  HCT 37.3* 36.7*  PLT 233 234   BMET  Recent Labs  07/13/13 0450 07/14/13 0405  NA 134* 130*  K 4.3 3.9  CL 98 93*  CO2 28 25  GLUCOSE 115* 149*  BUN 11 12  CREATININE 0.93 1.00  CALCIUM 8.7 9.0   PT/INR  Recent Labs  07/13/13 0450 07/14/13 0405  LABPROT 17.0* 17.9*  INR 1.42 1.52*   CMP     Component Value Date/Time   NA 130* 07/14/2013 0405   K 3.9 07/14/2013 0405   CL 93* 07/14/2013 0405   CO2 25 07/14/2013 0405   GLUCOSE 149* 07/14/2013 0405   BUN 12 07/14/2013 0405   CREATININE 1.00 07/14/2013 0405   CALCIUM 9.0 07/14/2013 0405   PROT 7.1 07/14/2013 0405   ALBUMIN 2.9* 07/14/2013 0405   AST 9 07/14/2013 0405   ALT 10 07/14/2013 0405   ALKPHOS 72 07/14/2013 0405   BILITOT 0.9 07/14/2013 0405   GFRNONAA 75* 07/14/2013 0405   GFRAA 87* 07/14/2013 0405   Lipase     Component Value Date/Time   LIPASE 12 07/08/2013 1647       Studies/Results: Dg Abd 2 Views  08/10/13   *RADIOLOGY REPORT*  Clinical Data: Small bowel obstruction.   ABDOMEN - 2 VIEW  Comparison: 07/11/2013  Findings: Nasogastric tube remains extending into the stomach. There remains dilatation of central small bowel loops up to a maximal caliber of the approximately 7 cm.  Overall appearance of small bowel dilatation is felt to be improved.  Some air and stool remains in the colon and findings are consistent with partial small bowel obstruction.  No free intraperitoneal air is seen.  IMPRESSION: Improving partial small bowel obstruction with some continued small bowel dilatation present.   Original Report Authenticated By: Irish Lack, M.D.    Anti-infectives: Anti-infectives   Start     Dose/Rate Route Frequency Ordered Stop   07/09/13 0046  azithromycin (ZITHROMAX) 500 mg in dextrose 5 % 250 mL IVPB  Status:  Discontinued     500 mg 250 mL/hr over 60 Minutes Intravenous Every 24 hours 07/09/13 0047 07/13/13 0935       Assessment/Plan  1. PSBO - improving  Plan: 1. Okay to remove NG tube today. Continue clear liquid diet this morning.  LOS: 6 days    Levert Feinstein 07/14/2013, 7:57 AM Pager: [phone]

## 2013-07-14 NOTE — Progress Notes (Signed)
ANTICOAGULATION CONSULT NOTE - Initial Consult  Pharmacy Consult for Coumadin + Lovenox Indication: atrial fibrillation  No Known Allergies  Patient Measurements: Height: 6' 1.5" (186.7 cm) Weight: 195 lb 9.6 oz (88.724 kg) IBW/kg (Calculated) : 81.05  Vital Signs: Temp: 98.6 F (37 C) (09/03 0458) Temp src: Oral (09/03 0458) BP: 98/63 mmHg (09/03 0458) Pulse Rate: 96 (09/03 0458)  Labs:  Recent Labs  07/12/13 0420 07/13/13 0450 07/14/13 0405  HGB 11.8* 12.6* 12.6*  HCT 35.0* 37.3* 36.7*  PLT 227 233 234  LABPROT 46.8* 17.0* 17.9*  INR 5.35* 1.42 1.52*  CREATININE 0.98 0.93 1.00    Estimated Creatinine Clearance: 80 ml/min (by C-G formula based on Cr of 1).  Assessment: 67yom on coumadin pta for afib (5mg  daily except 2.5mg  on Sundays). INR on admission was elevated at 3.51. INR continued to trend up despite no coumadin since 8/27. Reached as high as 5.35 and vitamin k 5mg  IV x 1 was given 9/1 to reverse in case patient needed surgery for his partial SBO. INR dropped from 5.35 to 1.42 yesterday and coumadin was resumed with lovenox bridge since no plans for surgery.  INR today remains subtherapeutic but is starting to trend up. Will repeat a higher dose tonight to overcome any vitamin k resistance. CBC is stable. Renal function is stable with CrCl 77ml/min. Weight = 88.7kg.  Goal of Therapy:  INR 2-3 Anti-Xa level 0.6-1.2 units/ml 4hrs after LMWH dose given Monitor platelets by anticoagulation protocol: Yes   Plan:  1) Repeat coumadin 7.5mg  x 1 2) Continue lovenox 90mg  sq q12 3) Daily INR, CBC already ordered  [name] 07/14/2013,8:23 AM

## 2013-07-14 NOTE — Progress Notes (Signed)
Subjective: Feeling better.  2 loose BM yesterday.    Objective: Vital signs in last 24 hours: Temp:  [97.8 F (36.6 C)-98.6 F (37 C)] 98.6 F (37 C) (09/03 0458) Pulse Rate:  [91-99] 96 (09/03 0458) Resp:  [18] 18 (09/03 0458) BP: (98-119)/(59-76) 98/63 mmHg (09/03 0458) SpO2:  [97 %-100 %] 97 % (09/03 0458) Weight:  [88.724 kg (195 lb 9.6 oz)] 88.724 kg (195 lb 9.6 oz) (09/03 0458) Weight change:  Last BM Date: 07/13/13  CBG (last 3)  No results found for this basename: GLUCAP,  in the last 72 hours  Intake/Output from previous day: 09/02 0701 - 09/03 0700 In: -  Out: 902 [Urine:900; Stool:2] Intake/Output this shift: Total I/O In: -  Out: 452 [Urine:450; Stool:2]  General appearance: alert and no distress Eyes: no scleral icterus Throat: oropharynx moist without erythema; NG in place (Clamped) Resp: clear to auscultation bilaterally Cardio: irregularly irregular rhythm GI: soft, non-tender; bowel sounds normal; no masses,  no organomegaly Extremities: no clubbing, cyanosis or edema   Lab Results:  Recent Labs  07/13/13 0450 07/14/13 0405  NA 134* 130*  K 4.3 3.9  CL 98 93*  CO2 28 25  GLUCOSE 115* 149*  BUN 11 12  CREATININE 0.93 1.00  CALCIUM 8.7 9.0    Recent Labs  07/13/13 0450 07/14/13 0405  AST 10 9  ALT 10 10  ALKPHOS 72 72  BILITOT 0.7 0.9  PROT 7.0 7.1  ALBUMIN 2.9* 2.9*    Recent Labs  08/01/2013 0420 07/13/13 0450  WBC 5.9 8.4  NEUTROABS 3.5 5.5  HGB 11.8* 12.6*  HCT 35.0* 37.3*  MCV 85.6 86.1  PLT 227 233   Lab Results  Component Value Date   INR 1.52* 07/14/2013   INR 1.42 07/13/2013   INR 5.35* 2013/08/01   No results found for this basename: CKTOTAL, CKMB, CKMBINDEX, TROPONINI,  in the last 72 hours No results found for this basename: TSH, T4TOTAL, FREET3, T3FREE, THYROIDAB,  in the last 72 hours No results found for this basename: VITAMINB12, FOLATE, FERRITIN, TIBC, IRON, RETICCTPCT,  in the last 72  hours  Studies/Results: Dg Abd 2 Views  08-01-2013   *RADIOLOGY REPORT*  Clinical Data: Small bowel obstruction.  ABDOMEN - 2 VIEW  Comparison: 07/11/2013  Findings: Nasogastric tube remains extending into the stomach. There remains dilatation of central small bowel loops up to a maximal caliber of the approximately 7 cm.  Overall appearance of small bowel dilatation is felt to be improved.  Some air and stool remains in the colon and findings are consistent with partial small bowel obstruction.  No free intraperitoneal air is seen.  IMPRESSION: Improving partial small bowel obstruction with some continued small bowel dilatation present.   Original Report Authenticated By: Irish Lack, M.D.     Medications: Scheduled: . enoxaparin (LOVENOX) injection  90 mg Subcutaneous Q12H  . Warfarin - Pharmacist Dosing Inpatient   Does not apply q1800   Continuous: . dextrose 5 % with KCl 20 mEq / L 20 mEq (07/14/13 0316)  . diltiazem (CARDIZEM) infusion 10 mg/hr (07/13/13 2124)    Assessment/Plan: Principal Problem:  1. Small bowel obstruction- continues to improve.  Will start clear liquids.  Leave NG in place pending surgery follow-up (pt prefers to keep it in) Active Problems:  2. Atrial fibrillation- will transition back to po Cardizem tomorrow if tolerating pos. Will give Lovenox until INR >2.0  3. Hypokalemia-improved. keep K>4.0.  4. Hyponatremia- change IV fluids to  D51/2NS with KCL and monitor. 5. Acute Bronchitis- Resolved.  6. HTN (hypertension)-continue cardizem.  7. Hyperlipidemia- resume lipitor soon. 8. Disposition- Anticipate discharge to home in 2-3 days if tolerating diet.     LOS: 6 days   Russell Dillon,W DOUGLAS 07/14/2013, 6:14 AM

## 2013-07-15 ENCOUNTER — Inpatient Hospital Stay (HOSPITAL_COMMUNITY): Payer: Medicare Other

## 2013-07-15 DIAGNOSIS — R143 Flatulence: Secondary | ICD-10-CM

## 2013-07-15 DIAGNOSIS — R110 Nausea: Secondary | ICD-10-CM

## 2013-07-15 DIAGNOSIS — R141 Gas pain: Secondary | ICD-10-CM

## 2013-07-15 DIAGNOSIS — R142 Eructation: Secondary | ICD-10-CM

## 2013-07-15 LAB — COMPREHENSIVE METABOLIC PANEL
AST: 12 U/L (ref 0–37)
Albumin: 3 g/dL — ABNORMAL LOW (ref 3.5–5.2)
Alkaline Phosphatase: 72 U/L (ref 39–117)
Chloride: 91 mEq/L — ABNORMAL LOW (ref 96–112)
Potassium: 3.9 mEq/L (ref 3.5–5.1)
Total Bilirubin: 0.7 mg/dL (ref 0.3–1.2)

## 2013-07-15 LAB — CBC WITH DIFFERENTIAL/PLATELET
Eosinophils Absolute: 0 10*3/uL (ref 0.0–0.7)
Eosinophils Relative: 0 % (ref 0–5)
Lymphs Abs: 0.7 10*3/uL (ref 0.7–4.0)
MCH: 28.6 pg (ref 26.0–34.0)
MCV: 83.7 fL (ref 78.0–100.0)
Monocytes Absolute: 0.8 10*3/uL (ref 0.1–1.0)
Platelets: 254 10*3/uL (ref 150–400)
RBC: 4.61 MIL/uL (ref 4.22–5.81)
RDW: 14.2 % (ref 11.5–15.5)

## 2013-07-15 LAB — PROTIME-INR: Prothrombin Time: 24 seconds — ABNORMAL HIGH (ref 11.6–15.2)

## 2013-07-15 MED ORDER — KCL IN DEXTROSE-NACL 20-5-0.9 MEQ/L-%-% IV SOLN
INTRAVENOUS | Status: DC
  Administered 2013-07-15: 100 mL/h via INTRAVENOUS
  Administered 2013-07-15 – 2013-07-16 (×2): via INTRAVENOUS
  Administered 2013-07-16: 100 mL/h via INTRAVENOUS
  Administered 2013-07-17 – 2013-07-18 (×2): via INTRAVENOUS
  Filled 2013-07-15 (×14): qty 1000

## 2013-07-15 MED ORDER — FAMOTIDINE 20 MG PO TABS
20.0000 mg | ORAL_TABLET | Freq: Two times a day (BID) | ORAL | Status: DC
  Administered 2013-07-15 – 2013-07-20 (×11): 20 mg via ORAL
  Filled 2013-07-15 (×12): qty 1

## 2013-07-15 MED ORDER — VITAMIN K1 10 MG/ML IJ SOLN
5.0000 mg | Freq: Once | INTRAVENOUS | Status: AC
  Administered 2013-07-15: 5 mg via INTRAVENOUS
  Filled 2013-07-15: qty 0.5

## 2013-07-15 NOTE — Progress Notes (Signed)
ANTICOAGULATION CONSULT NOTE - follow-up note  Pharmacy Consult for Coumadin + Lovenox Indication: atrial fibrillation  No Known Allergies  Patient Measurements: Height: 6' 1.5" (186.7 cm) Weight: 198 lb 12.8 oz (90.175 kg) IBW/kg (Calculated) : 81.05  Vital Signs: Temp: 98.6 F (37 C) (09/04 0404) Temp src: Oral (09/04 0404) BP: 117/67 mmHg (09/04 0404) Pulse Rate: 64 (09/04 0404)  Labs:  Recent Labs  07/13/13 0450 07/14/13 0405 07/15/13 0630  HGB 12.6* 12.6* 13.2  HCT 37.3* 36.7* 38.6*  PLT 233 234 254  LABPROT 17.0* 17.9* 24.0*  INR 1.42 1.52* 2.23*  CREATININE 0.93 1.00 1.01    Estimated Creatinine Clearance: 79.2 ml/min (by C-G formula based on Cr of 1.01).  Assessment: 67 yom on coumadin pta for afib (5mg  daily except 2.5mg  on Sundays). INR on admission 8/28 was elevated at 3.51. INR continued to trend up despite no coumadin since 8/27. Reached as high as 5.35 on 9/1 and vitamin K 5mg  IV x 1 was given 9/1 to reverse in case patient needed surgery for his partial SBO. INR dropped from 5.35 to 1.42 on 9/2 and coumadin was resumed with lovenox bridge since no plans for surgery.  INR now therapeutic at 2.23.  Lovenox discontinued. NG tube out 9/3. But had vomiting last night, nauseated, abdomen distended; may need to replace NG tube and possibly surgery soon.  Goal of Therapy:  INR 2-3 Anti-Xa level 0.6-1.2 units/ml 4hrs after LMWH dose given Monitor platelets by anticoagulation protocol: Yes   Plan:   Lovenox discontinued this morning with therapeutic INR.  Coumadin to be held today.  Will continue daily PT/INR.  Follow up plans, +/- resume Lovenox if INR drops <2 again.  Dennie Fetters, RPh Pager: [phone] 07/15/2013,10:31 AM

## 2013-07-15 NOTE — Progress Notes (Signed)
Patient's bowel obstruction is worse.  Will likely need surgery after INR has improved.  Marta Lamas. Gae Bon, MD, FACS [phone] [facility]

## 2013-07-15 NOTE — Progress Notes (Addendum)
[facility]ass[facility] Progress Note    Subjective: Patient had a bad evening and morning.  Even though he has had a bowel movement this AM, he seem more distended.  Definitely more nauseated.  Objective: Vital signs in last 24 hours: Temp:  [97.5 F (36.4 C)-98.6 F (37 C)] 98.6 F (37 C) (09/04 0404) Pulse Rate:  [64-101] 64 (09/04 0404) Resp:  [18] 18 (09/04 0404) BP: (104-117)/(65-67) 117/67 mmHg (09/04 0404) SpO2:  [96 %-98 %] 96 % (09/04 0404) Weight:  [90.175 kg (198 lb 12.8 oz)] 90.175 kg (198 lb 12.8 oz) (09/04 0404) Last BM Date: 07/15/13  Intake/Output from previous day: 09/03 0701 - 09/04 0700 In: 1800 [P.O.:480; I.V.:1320] Out: 300 [Urine:300] Intake/Output this shift:    General: Not as talkative and happy.  Seem uncomfortable  Lungs: Clear  Abd: Distended, hypoactive bowel sounds.  No peritonitis.  Extremities: Right leg more swollen than left.  Rlated to knee  Neuro: Intact  Lab Results:  @LABLAST2 (wbc:2,hgb:2,hct:2,plt:2) BMET  Recent Labs  07/14/13 0405 07/15/13 0630  NA 130* 128*  K 3.9 3.9  CL 93* 91*  CO2 25 25  GLUCOSE 149* 156*  BUN 12 16  CREATININE 1.00 1.01  CALCIUM 9.0 9.4   PT/INR  Recent Labs  07/14/13 0405 07/15/13 0630  LABPROT 17.9* 24.0*  INR 1.52* 2.23*   ABG No results found for this basename: PHART, PCO2, PO2, HCO3,  in the last 72 hours  Studies/Results: No results found.  Anti-infectives: Anti-infectives   Start     Dose/Rate Route Frequency Ordered Stop   07/09/13 0046  azithromycin (ZITHROMAX) 500 mg in dextrose 5 % 250 mL IVPB  Status:  Discontinued     500 mg 250 mL/hr over 60 Minutes Intravenous Every 24 hours 07/09/13 0047 07/13/13 0935      Assessment/Plan: s/p  Bowel obstruction does not seem to have resolved.  Repeat X-rays are pending. May need NGT again if he continues to vomit.  If so, then he will likely need surgery soon.  LOS: 7 days   Marta Lamas. Gae Bon, MD,  FACS [phone] [facility] 07/15/2013   Patient is fully anticoagulated and would have to be reversed prior to surgical intervention

## 2013-07-15 NOTE — Progress Notes (Signed)
Note written by me separately.  Will give Vit K and perhaps may need surgery tomorrow.  Marta Lamas. Gae Bon, MD, FACS [phone] [facility]

## 2013-07-15 NOTE — Progress Notes (Deleted)
Patient ID: Russell Dillon, male   DOB: January 16, 1946, 67 y.o.   MRN: [id number]    Subjective: Doing okay this morning. Had one episode of vomiting last night, has tolerated sips/chips since then. NG tube removed yesterday. Had a bowel movement this morning (was incontinent in the bed, unable to reach bathroom in time). Has had some nausea still, thinks related to heartburn.  Objective: Vital signs in last 24 hours: Temp:  [97.5 F (36.4 C)-98.6 F (37 C)] 98.6 F (37 C) (09/04 0404) Pulse Rate:  [64-101] 64 (09/04 0404) Resp:  [18] 18 (09/04 0404) BP: (104-117)/(65-67) 117/67 mmHg (09/04 0404) SpO2:  [96 %-98 %] 96 % (09/04 0404) Weight:  [198 lb 12.8 oz (90.175 kg)] 198 lb 12.8 oz (90.175 kg) (09/04 0404) Last BM Date: 07/15/13  Intake/Output from previous day: 09/03 0701 - 09/04 0700 In: 1800 [P.O.:480; I.V.:1320] Out: 300 [Urine:300] Intake/Output this shift:    PE: Abd: soft, NT, ND, +BS.  Lab Results:   Recent Labs  07/14/13 0405 07/15/13 0630  WBC 10.9* 14.8*  HGB 12.6* 13.2  HCT 36.7* 38.6*  PLT 234 254   BMET  Recent Labs  07/14/13 0405 07/15/13 0630  NA 130* 128*  K 3.9 3.9  CL 93* 91*  CO2 25 25  GLUCOSE 149* 156*  BUN 12 16  CREATININE 1.00 1.01  CALCIUM 9.0 9.4   PT/INR  Recent Labs  07/14/13 0405 07/15/13 0630  LABPROT 17.9* 24.0*  INR 1.52* 2.23*   CMP     Component Value Date/Time   NA 128* 07/15/2013 0630   K 3.9 07/15/2013 0630   CL 91* 07/15/2013 0630   CO2 25 07/15/2013 0630   GLUCOSE 156* 07/15/2013 0630   BUN 16 07/15/2013 0630   CREATININE 1.01 07/15/2013 0630   CALCIUM 9.4 07/15/2013 0630   PROT 7.4 07/15/2013 0630   ALBUMIN 3.0* 07/15/2013 0630   AST 12 07/15/2013 0630   ALT 12 07/15/2013 0630   ALKPHOS 72 07/15/2013 0630   BILITOT 0.7 07/15/2013 0630   GFRNONAA 74* 07/15/2013 0630   GFRAA 86* 07/15/2013 0630   Lipase     Component Value Date/Time   LIPASE 12 07/08/2013 1647       Studies/Results: No results  found.  Anti-infectives: Anti-infectives   Start     Dose/Rate Route Frequency Ordered Stop   07/09/13 0046  azithromycin (ZITHROMAX) 500 mg in dextrose 5 % 250 mL IVPB  Status:  Discontinued     500 mg 250 mL/hr over 60 Minutes Intravenous Every 24 hours 07/09/13 0047 07/13/13 0935       Assessment/Plan  1. PSBO - improving  Plan: 1. Will give pepcid for heartburn, likely contributing to nausea. Continue clears today since vomited last night.    LOS: 7 days    Levert Feinstein 07/15/2013, 8:35 AM Pager: [phone]

## 2013-07-15 NOTE — Progress Notes (Signed)
PATIENT JUST NOW TOLD ME HE VOMIT UP A MODERATE AMT. OF BROWNISH FLUID AND HAD SOME GAS DURING THE NIGHT.THIS HAPPEN AT APPROX. 2 A.M. INSTRUCTED PATEITN TO CALL IMMED. WHEN VOMITS.

## 2013-07-15 NOTE — Progress Notes (Signed)
Subjective: Russell Dillon.  Started on clear liquids.  Had episode of vomitting last pm after dinner and meds.  No other vomitting reported.  3 BM including semisolid BM this am.  Abdomen feels more distended.  Objective: Vital signs in last 24 hours: Temp:  [97.5 F (36.4 C)-98.6 F (37 C)] 98.6 F (37 C) (09/04 0404) Pulse Rate:  [64-101] 64 (09/04 0404) Resp:  [18] 18 (09/04 0404) BP: (104-117)/(65-67) 117/67 mmHg (09/04 0404) SpO2:  [96 %-98 %] 96 % (09/04 0404) Weight:  [90.175 kg (198 lb 12.8 oz)] 90.175 kg (198 lb 12.8 oz) (09/04 0404) Weight change: 1.452 kg (3 lb 3.2 oz) Last BM Date: 07/14/13  CBG (last 3)  No results found for this basename: GLUCAP,  in the last 72 hours  Intake/Output from previous day: 09/03 0701 - 09/04 0700 In: 1800 [P.O.:480; I.V.:1320] Out: 300 [Urine:300] Intake/Output this shift:    General appearance: alert, flat affect Eyes: no scleral icterus Throat: oropharynx moist without erythema Resp: clear to auscultation bilaterally Cardio: irregularly irregular rhythm GI: distended with very scant bowel sounds; nontender Extremities: no clubbing, cyanosis or edema   Lab Results:  Recent Labs  07/14/13 0405 07/15/13 0630  NA 130* 128*  K 3.9 3.9  CL 93* 91*  CO2 25 25  GLUCOSE 149* 156*  BUN 12 16  CREATININE 1.00 1.01  CALCIUM 9.0 9.4    Recent Labs  07/14/13 0405 07/15/13 0630  AST 9 12  ALT 10 12  ALKPHOS 72 72  BILITOT 0.9 0.7  PROT 7.1 7.4  ALBUMIN 2.9* 3.0*    Recent Labs  07/14/13 0405 07/15/13 0630  WBC 10.9* 14.8*  NEUTROABS 8.3* 13.2*  HGB 12.6* 13.2  HCT 36.7* 38.6*  MCV 84.4 83.7  PLT 234 254   Lab Results  Component Value Date   INR 2.23* 07/15/2013   INR 1.52* 07/14/2013   INR 1.42 07/13/2013    Studies/Results: No results found.   Medications: Scheduled: . enoxaparin (LOVENOX) injection  90 mg Subcutaneous Q12H  . Warfarin - Pharmacist Dosing Inpatient   Does not apply q1800    Continuous: . dextrose 5 %-0.45% NaCl with KCl Pediatric custom IV fluid 100 mL/hr at 07/15/13 0501  . diltiazem (CARDIZEM) infusion 10 mg/hr (07/15/13 0517)    Assessment/Plan: Principal Problem: 1. Small bowel obstruction- he appears to be more distended this am with episode of N/V last pm despite ongoing BMs.  Will repeat Abdominal X-ray but suspect he has a persistent SBO.  May need to replace Russell tube and reconsider surgery- defer to General Surgery.  Active Problems: 2. Atrial fibrillation- INR is now therapeutic.  Discontinue Lovenox and hold Coumadin in the event that surgery is necessary. 3. HTN (hypertension)- continue IV Cardizem given ongoing GI issues.    LOS: 7 days   Russell Dillon,W DOUGLAS 07/15/2013, 7:43 AM

## 2013-07-16 ENCOUNTER — Inpatient Hospital Stay (HOSPITAL_COMMUNITY): Payer: Medicare Other

## 2013-07-16 LAB — COMPREHENSIVE METABOLIC PANEL
Albumin: 2.6 g/dL — ABNORMAL LOW (ref 3.5–5.2)
BUN: 15 mg/dL (ref 6–23)
CO2: 26 mEq/L (ref 19–32)
Chloride: 100 mEq/L (ref 96–112)
Creatinine, Ser: 1.02 mg/dL (ref 0.50–1.35)
GFR calc Af Amer: 85 mL/min — ABNORMAL LOW (ref >90)
GFR calc non Af Amer: 73 mL/min — ABNORMAL LOW (ref >90)
Glucose, Bld: 130 mg/dL — ABNORMAL HIGH (ref 70–99)
Total Bilirubin: 0.6 mg/dL (ref 0.3–1.2)

## 2013-07-16 LAB — CBC WITH DIFFERENTIAL/PLATELET
Basophils Absolute: 0 10*3/uL (ref 0.0–0.1)
Lymphocytes Relative: 17 % (ref 12–46)
Lymphs Abs: 1.4 10*3/uL (ref 0.7–4.0)
Neutro Abs: 5.7 10*3/uL (ref 1.7–7.7)
Neutrophils Relative %: 72 % (ref 43–77)
Platelets: 249 10*3/uL (ref 150–400)
RBC: 4.18 MIL/uL — ABNORMAL LOW (ref 4.22–5.81)
RDW: 14.2 % (ref 11.5–15.5)
WBC: 7.9 10*3/uL (ref 4.0–10.5)

## 2013-07-16 LAB — PROTIME-INR
INR: 1.3 (ref 0.00–1.49)
Prothrombin Time: 15.9 seconds — ABNORMAL HIGH (ref 11.6–15.2)

## 2013-07-16 MED ORDER — HEPARIN (PORCINE) IN NACL 100-0.45 UNIT/ML-% IJ SOLN
1750.0000 [IU]/h | INTRAMUSCULAR | Status: DC
  Administered 2013-07-16 – 2013-07-17 (×2): 1400 [IU]/h via INTRAVENOUS
  Filled 2013-07-16 (×5): qty 250

## 2013-07-16 MED ORDER — HEPARIN (PORCINE) IN NACL 100-0.45 UNIT/ML-% IJ SOLN
1200.0000 [IU]/h | INTRAMUSCULAR | Status: DC
  Administered 2013-07-16: 1200 [IU]/h via INTRAVENOUS
  Filled 2013-07-16 (×2): qty 250

## 2013-07-16 MED ORDER — HEPARIN BOLUS VIA INFUSION
2500.0000 [IU] | Freq: Once | INTRAVENOUS | Status: AC
  Administered 2013-07-16: 2500 [IU] via INTRAVENOUS
  Filled 2013-07-16: qty 2500

## 2013-07-16 MED ORDER — BOOST / RESOURCE BREEZE PO LIQD
1.0000 | Freq: Three times a day (TID) | ORAL | Status: DC
  Administered 2013-07-16 – 2013-07-17 (×2): 1 via ORAL

## 2013-07-16 NOTE — Progress Notes (Signed)
INITIAL NUTRITION ASSESSMENT  DOCUMENTATION CODES Per approved criteria  -severe malnutrition in the context of acute illness   INTERVENTION: 1. Recommend consideration of TPN if pt to remain NPO/CL, pt is now day 8.   2. Resource Breeze po TID, each supplement provides 250 kcal and 9 grams of protein.  (this only meets 31% estimated protein needs, diet provides negligible protein)  NUTRITION DIAGNOSIS: Malnutrtion related to prolonged NPO/CL status as evidenced by weight loss and muscle/fat wasting.   Goal: Diet advance to meet >/=90% estimated nutrition needs.   Monitor:  PO intake, diet advance, weight trends  Reason for Assessment: NPO/CL x 8 days   67 y.o. male  Admitting Dx: Small bowel obstruction  ASSESSMENT: Pt admitted with likely gastritis, N/V/D. Pt also noted to have SBO. NG tube placed for suction with some improvement. Has had several BM's this admission. Once NG tube was clamped pt did begin to have some vomiting and increased abdominal distention. SBO does not seem to be resolved at this point and may require surgical intervention per notes.   Pt has been NPO/CL diet for 8 days. Recommend if anticipated that pt will remain NPO/CL, consider initiation of TPN to meet nutrition needs. Will add oral nutrition supplements while on CL diet to provide additional protein, but even if pt consumes 3 supplements per day will only meet 30% estimated protein needs.   Pt admission weight of 201 lbs, now down to 193 lbs. Weight loss of 8 lbs in one week (4% body weight).   Nutrition Focused Physical Exam:  Subcutaneous Fat:  Orbital Region: mild wasting Upper Arm Region: mild wasting Thoracic and Lumbar Region: mild wasting   Muscle:  Temple Region: moderate wasting Clavicle Bone Region: severe wasting Clavicle and Acromion Bone Region: moderate wasting Scapular Bone Region: mild wasting Dorsal Hand: n/a Patellar Region: moderate wasting Anterior Thigh Region: mild  wasting  Posterior Calf Region: mild wasting  Edema: none    Pt meets criteria for severe malnutrition in the context of acute illness 2/2 weight loss of 4% body weight in a week, meeting <50% estimated energy needs for >/=5 days, and mild-severe muscle and fat wasting.   Height: Ht Readings from Last 1 Encounters:  07/09/13 6' 1.5" (1.867 m)    Weight: Wt Readings from Last 1 Encounters:  07/16/13 193 lb 12.6 oz (87.9 kg)    Ideal Body Weight: 187 lbs   % Ideal Body Weight: 103%  Wt Readings from Last 10 Encounters:  07/16/13 193 lb 12.6 oz (87.9 kg)  06/01/13 201 lb (91.173 kg)  10/12/12 195 lb 12.8 oz (88.814 kg)  12/19/11 187 lb (84.823 kg)  06/04/11 193 lb (87.544 kg)  10/11/09 190 lb (86.183 kg)  Admission weight: 201 lbs   Usual Body Weight: 201 lbs   % Usual Body Weight: 96%  BMI:  Body mass index is 25.22 kg/(m^2). Overweight   Estimated Nutritional Needs: Kcal: 2050-2300 Protein: 85-100 gm  Fluid: 2.1-2.3 L   Skin: intact   Diet Order: Clear Liquid  EDUCATION NEEDS: -No education needs identified at this time   Intake/Output Summary (Last 24 hours) at 07/16/13 0952 Last data filed at 07/16/13 0627  Gross per 24 hour  Intake   1200 ml  Output   1200 ml  Net      0 ml    Last BM: 9/5   Labs:   Recent Labs Lab 07/14/13 0405 07/15/13 0630 07/16/13 0510  NA 130* 128* 135  K  3.9 3.9 4.6  CL 93* 91* 100  CO2 25 25 26   BUN 12 16 15   CREATININE 1.00 1.01 1.02  CALCIUM 9.0 9.4 9.1  GLUCOSE 149* 156* 130*    CBG (last 3)  No results found for this basename: GLUCAP,  in the last 72 hours  Scheduled Meds: . famotidine  20 mg Oral BID  . Warfarin - Pharmacist Dosing Inpatient   Does not apply q1800    Continuous Infusions: . dextrose 5 % and 0.9 % NaCl with KCl 20 mEq/L 100 mL/hr (07/16/13 0928)  . diltiazem (CARDIZEM) infusion 10 mg/hr (07/16/13 1610)    Past Medical History  Diagnosis Date  . Chronic atrial fibrillation   .  Hypertension   . Chronic anticoagulation   . Hyperlipidemia   . BPH (benign prostatic hypertrophy)   . Hx SBO   . Diastolic dysfunction October 2010    Normal LV systolic function  . Small bowel obstruction     Past Surgical History  Procedure Laterality Date  . Cardioversion  05/01/2004  . US echocardiography  09/05/2009    ef 55-60%  . Cardiovascular stress test  09/30/2007    EF 67%  No ischemia.     Clarene Duke RD, LDN Pager [phone] After Hours pager [phone]

## 2013-07-16 NOTE — Progress Notes (Signed)
[facility]S/[facility] Progress Note    Subjective: Patient much better today.  Had a lot of flatus and had several loose bowel movements.  Nontender  Objective: Vital signs in last 24 hours: Temp:  [97.8 F (36.6 C)-98.3 F (36.8 C)] 97.8 F (36.6 C) 08-02-23 0543) Pulse Rate:  [81-104] 81 Aug 02, 2023 0543) Resp:  [18] 18 08-02-2023 0543) BP: (93-101)/(64-67) 93/64 mmHg Aug 02, 2023 0543) SpO2:  [96 %-97 %] 96 % 2023-08-02 0543) Weight:  [87.9 kg (193 lb 12.6 oz)] 87.9 kg (193 lb 12.6 oz) 2023-08-02 0543) Last BM Date: 08/01/13  Intake/Output from previous day: 09/04 0701 - 08/02/2023 0700 In: 1200 [I.V.:1200] Out: 1200 [Urine:1200] Intake/Output this shift:    General: No acute distress  Lungs: Clear  Abd: Soft, much less distended.  Good active bowel sounds.  X-rays today show gas throughout bowels, including the colon, but still has some air-fluid levels.  Not normal  Extremities: Right leg still a bit larger and edematous.  Neuro: Intact  Lab Results:  @LABLAST2 (wbc:2,hgb:2,hct:2,plt:2) BMET  Recent Labs  07/15/13 0630 08-01-2013 0510  NA 128* 135  K 3.9 4.6  CL 91* 100  CO2 25 26  GLUCOSE 156* 130*  BUN 16 15  CREATININE 1.01 1.02  CALCIUM 9.4 9.1   PT/INR  Recent Labs  07/15/13 0630 08-01-13 0510  LABPROT 24.0* 15.9*  INR 2.23* 1.30   ABG No results found for this basename: PHART, PCO2, PO2, HCO3,  in the last 72 hours  Studies/Results: Dg Abd 2 Views  08/01/13   *RADIOLOGY REPORT*  Clinical Data: Small bowel obstruction  ABDOMEN - 2 VIEW  Comparison: 07/15/2013  Findings: Small bowel dilatation shows mild interval improvement. Small amount of gas in the colon which is nondilated.  No free air.  IMPRESSION: Mild improvement in small bowel obstruction.   Original Report Authenticated By: Janeece Riggers, M.D.   Dg Abd 2 Views  07/15/2013   *RADIOLOGY REPORT*  Clinical Data: Abdominal distension and small bowel obstruction  ABDOMEN - 2 VIEW  Comparison: 07/12/2013  Findings: When compared  the prior exam there has been a significant increase in the degree of small bowel dilatation.  Multiple air fluid levels are identified.  No free intraperitoneal air is seen. A sclerotic area is again noted in the right iliac bone.  The nasogastric catheter has been removed in the interval.  IMPRESSION: Persistent small bowel obstruction.  The number of dilated loops of small bowel has increased in the interval from prior exam.   Original Report Authenticated By: Alcide Clever, M.D.    Anti-infectives: Anti-infectives   Start     Dose/Rate Route Frequency Ordered Stop   07/09/13 0046  azithromycin (ZITHROMAX) 500 mg in dextrose 5 % 250 mL IVPB  Status:  Discontinued     500 mg 250 mL/hr over 60 Minutes Intravenous Every 24 hours 07/09/13 0047 07/13/13 0935      Assessment/Plan: s/p  Advance diet Long conversation with the patient and his daughter.  His cas eis not straight forward.  He shows signs of an intermittent partial obstruction. Because his is so much better I think we can try to start a clear liquid diet again.  If he should start to show signs of obstruction we should then consider surgery without much delay.  I would stay off the coumadin, but would agree to putting the patient on therapeutic heparin which we can hold for 4 hours if surgery is needed. Will repeat the x-rays in the morning.  Keep on IVFs.  LOS: 8 days   Marta Lamas. Gae Bon, MD, FACS [phone] [facility] 07/16/2013

## 2013-07-16 NOTE — Progress Notes (Signed)
Subjective: Feels better.  Only taking sips and chips.  Several BM overnight.  No nausea/vomitting.  Objective: Vital signs in last 24 hours: Temp:  [97.8 F (36.6 C)-98.3 F (36.8 C)] 97.8 F (36.6 C) (09/05 0543) Pulse Rate:  [81-104] 81 (09/05 0543) Resp:  [18] 18 (09/05 0543) BP: (93-101)/(64-67) 93/64 mmHg (09/05 0543) SpO2:  [96 %-97 %] 96 % (09/05 0543) Weight:  [87.9 kg (193 lb 12.6 oz)] 87.9 kg (193 lb 12.6 oz) (09/05 0543) Weight change: -2.275 kg (-5 lb 0.3 oz) Last BM Date: 2013-08-03  CBG (last 3)  No results found for this basename: GLUCAP,  in the last 72 hours  Intake/Output from previous day: 2023-08-04 0701 - 09/05 0700 In: 1200 [I.V.:1200] Out: 1200 [Urine:1200] Intake/Output this shift:    General appearance: alert and no distress Eyes: no scleral icterus Throat: oropharynx moist without erythema Resp: clear to auscultation bilaterally Cardio: irregularly irregular rhythm GI: less distended; scant bowel sounds, no tenderness Extremities: no clubbing, cyanosis or edema   Lab Results:  Recent Labs  2013/08/03 0630 07/16/13 0510  NA 128* 135  K 3.9 4.6  CL 91* 100  CO2 25 26  GLUCOSE 156* 130*  BUN 16 15  CREATININE 1.01 1.02  CALCIUM 9.4 9.1    Recent Labs  08/03/13 0630 07/16/13 0510  AST 12 11  ALT 12 10  ALKPHOS 72 61  BILITOT 0.7 0.6  PROT 7.4 6.9  ALBUMIN 3.0* 2.6*    Recent Labs  2013-08-03 0630 07/16/13 0510  WBC 14.8* 7.9  NEUTROABS 13.2* 5.7  HGB 13.2 11.9*  HCT 38.6* 35.5*  MCV 83.7 84.9  PLT 254 249   Lab Results  Component Value Date   INR 1.30 07/16/2013   INR 2.23* 08-03-13   INR 1.52* 07/14/2013    Studies/Results: Dg Abd 2 Views  08-03-2013   *RADIOLOGY REPORT*  Clinical Data: Abdominal distension and small bowel obstruction  ABDOMEN - 2 VIEW  Comparison: 07/12/2013  Findings: When compared the prior exam there has been a significant increase in the degree of small bowel dilatation.  Multiple air fluid levels are  identified.  No free intraperitoneal air is seen. A sclerotic area is again noted in the right iliac bone.  The nasogastric catheter has been removed in the interval.  IMPRESSION: Persistent small bowel obstruction.  The number of dilated loops of small bowel has increased in the interval from prior exam.   Original Report Authenticated By: Alcide Clever, M.D.     Medications: Scheduled: . famotidine  20 mg Oral BID  . Warfarin - Pharmacist Dosing Inpatient   Does not apply q1800   Continuous: . dextrose 5 % and 0.9 % NaCl with KCl 20 mEq/L 100 mL/hr at 2013/08/03 2334  . diltiazem (CARDIZEM) infusion 10 mg/hr (August 03, 2013 2051)    Assessment/Plan: Principal Problem: 1. Small bowel obstruction- anticipate surgical management per [facility] given failure to resolve with conservative management.   Active Problems: 2. Atrial fibrillation- remains on Cardizem gtt until tolerating pos.  INR has been reversed for possible surgery. Resume Lovenox (or Heparin drip) if no surgery planned or post-op when OK with surgery.  SCDs while off Lovenox for DVT prophylaxis.  Resume Coumadin when stable postop. 3. HTN (hypertension)- controlled on Cardizem. 4. Hyponatremia/Hypokalemia- improved with change in IV fluids and repletion. 5. Disposition- anticipate discharge home once SBO resolves/postop. Would appreciate primary management by General Surgery postoperatively since medical issues are all stable.     LOS: 8 days  SHAW,W DOUGLAS 07/16/2013, 7:42 AM

## 2013-07-16 NOTE — Progress Notes (Signed)
ANTICOAGULATION CONSULT NOTE - Initial Consult  Pharmacy Consult for Heparin (while Coumadin is on hold) Indication: atrial fibrillation  No Known Allergies  Patient Measurements: Height: 6' 1.5" (186.7 cm) Weight: 193 lb 12.6 oz (87.9 kg) IBW/kg (Calculated) : 81.05 Heparin Dosing Weight: 87.9 kg  Vital Signs: Temp: 97.8 F (36.6 C) (09/05 0543) Temp src: Oral (09/05 0543) BP: 93/64 mmHg (09/05 0543) Pulse Rate: 81 (09/05 0543)  Labs:  Recent Labs  07/14/13 0405 07/15/13 0630 07/16/13 0510  HGB 12.6* 13.2 11.9*  HCT 36.7* 38.6* 35.5*  PLT 234 254 249  LABPROT 17.9* 24.0* 15.9*  INR 1.52* 2.23* 1.30  CREATININE 1.00 1.01 1.02    Estimated Creatinine Clearance: 78.4 ml/min (by C-G formula based on Cr of 1.02).   Medical History: Past Medical History  Diagnosis Date  . Chronic atrial fibrillation   . Hypertension   . Chronic anticoagulation   . Hyperlipidemia   . BPH (benign prostatic hypertrophy)   . Hx SBO   . Diastolic dysfunction October 2010    Normal LV systolic function  . Small bowel obstruction     Medications:  Scheduled:  . famotidine  20 mg Oral BID  . feeding supplement  1 Container Oral TID BM    Assessment: 67 yo male with small bowel obstruction.  Coumadin on hold in case surgery is needed.  Pharmacy asked to start IV heparin drip now that INR is subtherapeutic.  CBC and platelet count stable.  Goal of Therapy:  Heparin level 0.3-0.7 units/ml Monitor platelets by anticoagulation protocol: Yes   Plan:  1. Start heparin drip at 1200 units/hr with no bolus. 2. Check heparin level in 6 hrs. 3. Daily CBC and heparin level. 4. F/u plans to resume oral anticoagulation.  Tad Moore, BCPS  Clinical Pharmacist Pager [phone]  07/16/2013 11:31 AM

## 2013-07-16 NOTE — Progress Notes (Signed)
ANTICOAGULATION CONSULT NOTE - Follow Up Consult  Pharmacy Consult for Heparin (while Coumadin is on hold) Indication: atrial fibrillation  No Known Allergies  Patient Measurements: Height: 6' 1.5" (186.7 cm) Weight: 193 lb 12.6 oz (87.9 kg) IBW/kg (Calculated) : 81.05 Heparin Dosing Weight: 87.9 kg  Vital Signs: Temp: 97.8 F (36.6 C) (09/05 1338) Temp src: Oral (09/05 1338) BP: 101/62 mmHg (09/05 1338) Pulse Rate: 71 (09/05 1338)  Labs:  Recent Labs  07/14/13 0405 07/15/13 0630 07/16/13 0510 07/16/13 1857  HGB 12.6* 13.2 11.9*  --   HCT 36.7* 38.6* 35.5*  --   PLT 234 254 249  --   LABPROT 17.9* 24.0* 15.9*  --   INR 1.52* 2.23* 1.30  --   HEPARINUNFRC  --   --   --  <0.10*  CREATININE 1.00 1.01 1.02  --     Estimated Creatinine Clearance: 78.4 ml/min (by C-G formula based on Cr of 1.02).  Medications:  Heparin at 1200units/hr  Assessment: 67 yo male with AFib and small bowel obstruction whose warfarin is being held in anticipation of possible surgery.  INR is subtherapeutic, so heparin was started today at 1200units/hr. First level is undetectable. Spoke with RN and no issues with line and drip has not been held for any reason. No bleeding noted.  Goal of Therapy:  Heparin level 0.3-0.7 units/ml Monitor platelets by anticoagulation protocol: Yes   Plan:  1. Heparin bolus with 2500units x1 2. Increase heparin drip to 1400units/hr 3. Check heparin level in 6 hrs 4. Daily CBC and heparin level 5. F/u plans to resume oral anticoagulation  Samer Dutton D. [place], PharmD Clinical Pharmacist Pager: [phone] 07/16/2013 8:12 PM

## 2013-07-17 ENCOUNTER — Inpatient Hospital Stay (HOSPITAL_COMMUNITY): Payer: Medicare Other

## 2013-07-17 LAB — COMPREHENSIVE METABOLIC PANEL
ALT: 12 U/L (ref 0–53)
Albumin: 2.7 g/dL — ABNORMAL LOW (ref 3.5–5.2)
Alkaline Phosphatase: 60 U/L (ref 39–117)
Calcium: 8.9 mg/dL (ref 8.4–10.5)
Potassium: 4.3 mEq/L (ref 3.5–5.1)
Sodium: 136 mEq/L (ref 135–145)
Total Protein: 6.8 g/dL (ref 6.0–8.3)

## 2013-07-17 LAB — CBC WITH DIFFERENTIAL/PLATELET
Basophils Absolute: 0 10*3/uL (ref 0.0–0.1)
Eosinophils Relative: 1 % (ref 0–5)
HCT: 35 % — ABNORMAL LOW (ref 39.0–52.0)
Hemoglobin: 11.8 g/dL — ABNORMAL LOW (ref 13.0–17.0)
Lymphocytes Relative: 12 % (ref 12–46)
MCHC: 33.7 g/dL (ref 30.0–36.0)
MCV: 85.8 fL (ref 78.0–100.0)
Monocytes Absolute: 0.7 10*3/uL (ref 0.1–1.0)
Monocytes Relative: 7 % (ref 3–12)
Neutro Abs: 8.6 10*3/uL — ABNORMAL HIGH (ref 1.7–7.7)
RDW: 14.4 % (ref 11.5–15.5)
WBC: 10.7 10*3/uL — ABNORMAL HIGH (ref 4.0–10.5)

## 2013-07-17 LAB — HEPARIN LEVEL (UNFRACTIONATED)
Heparin Unfractionated: 0.55 IU/mL (ref 0.30–0.70)
Heparin Unfractionated: 0.54 IU/mL (ref 0.30–0.70)

## 2013-07-17 LAB — PROTIME-INR: INR: 1.36 (ref 0.00–1.49)

## 2013-07-17 MED ORDER — HEPARIN BOLUS VIA INFUSION
2500.0000 [IU] | Freq: Once | INTRAVENOUS | Status: AC
  Administered 2013-07-17: 2500 [IU] via INTRAVENOUS
  Filled 2013-07-17: qty 2500

## 2013-07-17 NOTE — Progress Notes (Signed)
ANTICOAGULATION CONSULT NOTE - Follow Up Consult  Pharmacy Consult for Heparin (while Coumadin is on hold) Indication: atrial fibrillation  No Known Allergies  Patient Measurements: Height: 6' 1.5" (186.7 cm) Weight: 197 lb 6.4 oz (89.54 kg) IBW/kg (Calculated) : 81.05 Heparin Dosing Weight: 87.9 kg  Vital Signs: Temp: 97.6 F (36.4 C) (09/06 0508) Temp src: Oral (09/06 0508) BP: 102/65 mmHg (09/06 0508) Pulse Rate: 87 (09/06 0508)  Labs:  Recent Labs  07/15/13 0630 07/16/13 0510 07/16/13 1857 07/17/13 0436  HGB 13.2 11.9*  --  11.8*  HCT 38.6* 35.5*  --  35.0*  PLT 254 249  --  270  LABPROT 24.0* 15.9*  --  16.4*  INR 2.23* 1.30  --  1.36  HEPARINUNFRC  --   --  <0.10* <0.10*  CREATININE 1.01 1.02  --  1.01    Estimated Creatinine Clearance: 79.2 ml/min (by C-G formula based on Cr of 1.01).  Medications:  Heparin at 1200units/hr  Assessment: 67 yo male with AFib and small bowel obstruction whose warfarin is being held in anticipation of possible surgery.  INR is subtherapeutic, so pt on heparin bridge. Heparin level remains undetectable on 1400 units/hr. Spoke with RN and no issues with line and drip has not been held for any reason. No bleeding noted.  Goal of Therapy:  Heparin level 0.3-0.7 units/ml Monitor platelets by anticoagulation protocol: Yes   Plan:  1. Heparin bolus with 2500units x1 2. Increase heparin drip to 1750units/hr 3. Check heparin level in 6 hrs  Christoper Fabian, PharmD, BCPS Clinical pharmacist, pager [phone] 07/17/2013 7:18 AM

## 2013-07-17 NOTE — Progress Notes (Addendum)
Subjective: Day 9 in hospital.  Surgery is directing care.  Patient is having a good morning.  No pain now. No n/v today. Liquid diet is being advanced as a trial.  Objective: Vital signs in last 24 hours: Temp:  [97.6 F (36.4 C)-98.3 F (36.8 C)] 97.6 F (36.4 C) 07/25/23 0508) Pulse Rate:  [71-87] 87 07/25/2023 0508) Resp:  [18] 18 07/25/23 0508) BP: (101-110)/(62-65) 102/65 mmHg 07-25-2023 0508) SpO2:  [97 %-99 %] 97 % 07/25/23 0508) Weight:  [89.54 kg (197 lb 6.4 oz)] 89.54 kg (197 lb 6.4 oz) 07/25/23 0508) Weight change: 1.64 kg (3 lb 9.9 oz) Last BM Date: 07/24/13  Intake/Output from previous day: 09/05 0701 - 07-25-23 0700 In: 360 [P.O.:360] Out: -  Intake/Output this shift:    General appearance: alert, cooperative and appears stated age Resp: clear to auscultation bilaterally Cardio: regular rate and rhythm, S1, S2 normal, no murmur, click, rub or gallop GI: hypoactive bs, not tender, nd, no mass or hsm Extremities: extremities normal, atraumatic, no cyanosis or edema Neurologic: Grossly normal   Lab Results:  Recent Labs  07/16/13 0510 24-Jul-2013 0436  WBC 7.9 10.7*  HGB 11.9* 11.8*  HCT 35.5* 35.0*  PLT 249 270   BMET  Recent Labs  07/16/13 0510 07/24/2013 0436  NA 135 136  K 4.6 4.3  CL 100 103  CO2 26 24  GLUCOSE 130* 116*  BUN 15 14  CREATININE 1.02 1.01  CALCIUM 9.1 8.9   CMET CMP     Component Value Date/Time   NA 136 Jul 24, 2013 0436   K 4.3 2013/07/24 0436   CL 103 24-Jul-2013 0436   CO2 24 07/24/2013 0436   GLUCOSE 116* 2013-07-24 0436   BUN 14 07-24-2013 0436   CREATININE 1.01 07/24/2013 0436   CALCIUM 8.9 July 24, 2013 0436   PROT 6.8 2013-07-24 0436   ALBUMIN 2.7* 07-24-2013 0436   AST 12 07-24-2013 0436   ALT 12 24-Jul-2013 0436   ALKPHOS 60 07-24-2013 0436   BILITOT 0.5 07-24-2013 0436   GFRNONAA 74* 24-Jul-2013 0436   GFRAA 86* 07/24/13 0436     Studies/Results: Dg Abd 2 Views  Jul 24, 2013   *RADIOLOGY REPORT*  Clinical Data: Small bowel obstruction.  ABDOMEN - 2 VIEW   Comparison: 07/16/2013  Findings: The examination demonstrates persistent air and fluid filled dilated small bowel loops with air-fluid levels.  The there is slightly less dilatation of the small bowel loops.  Air is seen throughout the colon. No evidence of free air.  Remainder of the exam is unchanged.  IMPRESSION: Continued evidence of at least partial small bowel obstruction with persistent dilated small bowel loops and air-fluid levels. Slightly less dilatation compared to the previous exam.   Original Report Authenticated By: Elberta Fortis, M.D.   Dg Abd 2 Views  07/16/2013   *RADIOLOGY REPORT*  Clinical Data: Small bowel obstruction  ABDOMEN - 2 VIEW  Comparison: 07/15/2013  Findings: Small bowel dilatation shows mild interval improvement. Small amount of gas in the colon which is nondilated.  No free air.  IMPRESSION: Mild improvement in small bowel obstruction.   Original Report Authenticated By: Janeece Riggers, M.D.    Medications: I have reviewed the patient's current medications. . famotidine  20 mg Oral BID  . feeding supplement  1 Container Oral TID BM   Current facility-administered medications:acetaminophen (TYLENOL) solution 650 mg, 650 mg, Oral, Q6H PRN, Kari Baars, MD, 650 mg at 07/14/13 0534;  acetaminophen (TYLENOL) suppository 650 mg, 650 mg, Rectal,  Q6H PRN, Ezequiel Kayser, MD;  acetaminophen (TYLENOL) tablet 650 mg, 650 mg, Oral, Q6H PRN, Ezequiel Kayser, MD, 650 mg at 07/14/13 1333 albuterol (PROVENTIL HFA;VENTOLIN HFA) 108 (90 BASE) MCG/ACT inhaler 2 puff, 2 puff, Inhalation, Q6H PRN, W Buren Kos, MD;  benzonatate (TESSALON) capsule 100 mg, 100 mg, Oral, TID PRN, Kari Baars, MD;  chlorpheniramine-HYDROcodone (TUSSIONEX) 10-8 MG/5ML suspension 5 mL, 5 mL, Oral, Q12H PRN, Ezequiel Kayser, MD, 5 mL at 07/13/13 0030 dextrose 5 % and 0.9 % NaCl with KCl 20 mEq/L infusion, , Intravenous, Continuous, Cherylynn Ridges, MD, Last Rate: 100 mL/hr at 07/17/13 0522;  diltiazem (CARDIZEM) 100  mg in dextrose 5 % 100 mL infusion, 10 mg/hr, Intravenous, Titrated, W Buren Kos, MD, Last Rate: 10 mL/hr at 07/17/13 0417, 10 mg/hr at 07/17/13 0417;  famotidine (PEPCID) tablet 20 mg, 20 mg, Oral, BID, Latrelle Dodrill, MD, 20 mg at 07/17/13 1039 feeding supplement (RESOURCE BREEZE) liquid 1 Container, 1 Container, Oral, TID BM, Tonye Becket, RD, 1 Container at 07/17/13 1000;  heparin ADULT infusion 100 units/mL (25000 units/250 mL), 1,750 Units/hr, Intravenous, Continuous, Hilario Quarry Amend, [facility], Last Rate: 17.5 mL/hr at 07/17/13 0825, 1,750 Units/hr at 07/17/13 0825;  ondansetron (ZOFRAN) injection 4 mg, 4 mg, Intravenous, Q6H PRN, Ezequiel Kayser, MD, 4 mg at 07/14/13 1955  INR/Prothrombin Time Lab Results  Component Value Date   INR 1.36 07/17/2013   INR 1.30 07/16/2013   INR 2.23* 07/15/2013     Assessment/Plan:  Principal Problem:   Small bowel obstruction, partial. If he fails this feeding trial he will likely need surgery. Defer any thought about possible TNA to surgery team Active Problems:   Atrial fibrillation-coumadin reversed. On Heparin   HTN (hypertension)-stable   Hyperlipidemia-follow.   LOS: 9 days   Ezequiel Kayser, MD 07/17/2013, 10:45 AM

## 2013-07-17 NOTE — Progress Notes (Signed)
Agree with A&P of KO,PA. He has continued to have BM's albeit liquid, and no nausea as he tries to take more in. I wonder if his baseline includes some dilated loops of small bowel

## 2013-07-17 NOTE — Progress Notes (Signed)
Patient ID: ZAIM NITTA, male   DOB: Apr 06, 1946, 67 y.o.   MRN: [id number]    Subjective: Pt feels ok this morning.  Tolerating clears as of now.  He had 4-5 BMs over night and does not feel bloated.  No nausea  Objective: Vital signs in last 24 hours: Temp:  [97.6 F (36.4 C)-98.3 F (36.8 C)] 97.6 F (36.4 C) (09/06 0508) Pulse Rate:  [71-87] 87 (09/06 0508) Resp:  [18] 18 (09/06 0508) BP: (101-110)/(62-65) 102/65 mmHg (09/06 0508) SpO2:  [97 %-99 %] 97 % (09/06 0508) Weight:  [197 lb 6.4 oz (89.54 kg)] 197 lb 6.4 oz (89.54 kg) (09/06 0508) Last BM Date: 07/29/13  Intake/Output from previous day: 2023/07/30 0701 - 09/06 0700 In: 360 [P.O.:360] Out: -  Intake/Output this shift:    PE: Abd: less distended, but still with some more distention than when I last saw him, very soft, NT, +BS Ht: a rib Lungs: CTAB  Lab Results:   Recent Labs  Jul 29, 2013 0510 07/17/13 0436  WBC 7.9 10.7*  HGB 11.9* 11.8*  HCT 35.5* 35.0*  PLT 249 270   BMET  Recent Labs  07/29/2013 0510 07/17/13 0436  NA 135 136  K 4.6 4.3  CL 100 103  CO2 26 24  GLUCOSE 130* 116*  BUN 15 14  CREATININE 1.02 1.01  CALCIUM 9.1 8.9   PT/INR  Recent Labs  07/29/13 0510 07/17/13 0436  LABPROT 15.9* 16.4*  INR 1.30 1.36   CMP     Component Value Date/Time   NA 136 07/17/2013 0436   K 4.3 07/17/2013 0436   CL 103 07/17/2013 0436   CO2 24 07/17/2013 0436   GLUCOSE 116* 07/17/2013 0436   BUN 14 07/17/2013 0436   CREATININE 1.01 07/17/2013 0436   CALCIUM 8.9 07/17/2013 0436   PROT 6.8 07/17/2013 0436   ALBUMIN 2.7* 07/17/2013 0436   AST 12 07/17/2013 0436   ALT 12 07/17/2013 0436   ALKPHOS 60 07/17/2013 0436   BILITOT 0.5 07/17/2013 0436   GFRNONAA 74* 07/17/2013 0436   GFRAA 86* 07/17/2013 0436   Lipase     Component Value Date/Time   LIPASE 12 07/08/2013 1647       Studies/Results: Dg Abd 2 Views  07/29/13   *RADIOLOGY REPORT*  Clinical Data: Small bowel obstruction  ABDOMEN - 2 VIEW  Comparison:  07/15/2013  Findings: Small bowel dilatation shows mild interval improvement. Small amount of gas in the colon which is nondilated.  No free air.  IMPRESSION: Mild improvement in small bowel obstruction.   Original Report Authenticated By: Janeece Riggers, M.D.   Dg Abd 2 Views  07/15/2013   *RADIOLOGY REPORT*  Clinical Data: Abdominal distension and small bowel obstruction  ABDOMEN - 2 VIEW  Comparison: 07/12/2013  Findings: When compared the prior exam there has been a significant increase in the degree of small bowel dilatation.  Multiple air fluid levels are identified.  No free intraperitoneal air is seen. A sclerotic area is again noted in the right iliac bone.  The nasogastric catheter has been removed in the interval.  IMPRESSION: Persistent small bowel obstruction.  The number of dilated loops of small bowel has increased in the interval from prior exam.   Original Report Authenticated By: Alcide Clever, M.D.    Anti-infectives: Anti-infectives   Start     Dose/Rate Route Frequency Ordered Stop   07/09/13 0046  azithromycin (ZITHROMAX) 500 mg in dextrose 5 % 250 mL IVPB  Status:  Discontinued     500 mg 250 mL/hr over 60 Minutes Intravenous Every 24 hours 07/09/13 0047 07/13/13 0935       Assessment/Plan  1. PSBO 2. A fib, INR normalized  Plan: 1. The patient tolerated this second trial of clear liquids.  X-rays are not very informative at this point, given continued dilatation.  Will hold off on repeat films and treat clinically.  Will advance to full liquids today.  If he fails this, because he has already failed one other time, he will require an operation for his obstruction. 2. If he fails his fulls, then we will start TNA, but will hold off in case he tolerates this.   LOS: 9 days    Oryan Winterton E 07/17/2013, 8:49 AM Pager: [phone]

## 2013-07-17 NOTE — Progress Notes (Addendum)
ANTICOAGULATION CONSULT NOTE - Follow Up Consult  Pharmacy Consult for Heparin (while Coumadin is on hold) Indication: atrial fibrillation  No Known Allergies  Patient Measurements: Height: 6' 1.5" (186.7 cm) Weight: 197 lb 6.4 oz (89.54 kg) IBW/kg (Calculated) : 81.05 Heparin Dosing Weight: 87.9 kg  Vital Signs: Temp: 97.9 F (36.6 C) (09/06 1436) Temp src: Oral (09/06 1436) BP: 105/63 mmHg (09/06 1436) Pulse Rate: 75 (09/06 1436)  Labs:  Recent Labs  07/15/13 0630 07/16/13 0510 07/16/13 1857 07/17/13 0436 07/17/13 1419  HGB 13.2 11.9*  --  11.8*  --   HCT 38.6* 35.5*  --  35.0*  --   PLT 254 249  --  270  --   LABPROT 24.0* 15.9*  --  16.4*  --   INR 2.23* 1.30  --  1.36  --   HEPARINUNFRC  --   --  <0.10* <0.10* 0.55  CREATININE 1.01 1.02  --  1.01  --     Estimated Creatinine Clearance: 79.2 ml/min (by C-G formula based on Cr of 1.01).  Medications:  Heparin at 1750 units/hr  Assessment: 67 yo male with AFib and small bowel obstruction whose coumadin is being held in anticipation of possible surgery.  INR is subtherapeutic at 1.3, so patient is on a heparin bridge. Heparin level is finally therapeutic after re-bolus and rate increase this morning. CBC is stable. No bleeding reported.  Goal of Therapy:  Heparin level 0.3-0.7 units/ml Monitor platelets by anticoagulation protocol: Yes   Plan:  1) Continue heparin at 1750 units/hr 2) 6 hour heparin level to confirm  Louie Casa, PharmD, BCPS 07/17/2013 2:55 PM  Addendum: Confirmatory heparin level is therapeutic at 0.54. Continue heparin at 1750 units/hr and follow up heparin level, CBC in the morning.  Louie Casa, PharmD, BCPS 07/17/2013, 8:18 PM

## 2013-07-18 LAB — COMPREHENSIVE METABOLIC PANEL
ALT: 24 U/L (ref 0–53)
AST: 22 U/L (ref 0–37)
CO2: 24 mEq/L (ref 19–32)
Chloride: 103 mEq/L (ref 96–112)
GFR calc non Af Amer: 72 mL/min — ABNORMAL LOW (ref >90)
Potassium: 5.1 mEq/L (ref 3.5–5.1)
Sodium: 136 mEq/L (ref 135–145)
Total Bilirubin: 0.6 mg/dL (ref 0.3–1.2)

## 2013-07-18 LAB — PROTIME-INR
INR: 1.68 — ABNORMAL HIGH (ref 0.00–1.49)
Prothrombin Time: 19.3 seconds — ABNORMAL HIGH (ref 11.6–15.2)

## 2013-07-18 LAB — CBC WITH DIFFERENTIAL/PLATELET
Basophils Relative: 0 % (ref 0–1)
Eosinophils Absolute: 0.1 10*3/uL (ref 0.0–0.7)
MCH: 29.5 pg (ref 26.0–34.0)
MCHC: 34.2 g/dL (ref 30.0–36.0)
Monocytes Relative: 7 % (ref 3–12)
Neutrophils Relative %: 77 % (ref 43–77)
Platelets: 286 10*3/uL (ref 150–400)

## 2013-07-18 MED ORDER — HEPARIN (PORCINE) IN NACL 100-0.45 UNIT/ML-% IJ SOLN
1850.0000 [IU]/h | INTRAMUSCULAR | Status: DC
  Administered 2013-07-18 – 2013-07-19 (×2): 1850 [IU]/h via INTRAVENOUS
  Filled 2013-07-18 (×3): qty 250

## 2013-07-18 NOTE — Progress Notes (Signed)
Subjective: He tolerated his feeding trial okay.  He has had 2-3 semi-soft stools.  Objective: Vital signs in last 24 hours: Temp:  [97.7 F (36.5 C)-97.9 F (36.6 C)] 97.8 F (36.6 C) (09/07 0420) Pulse Rate:  [75-97] 88 (09/07 0420) Resp:  [16-18] 18 (09/07 0420) BP: (105-110)/(63-69) 110/69 mmHg (09/07 0420) SpO2:  [96 %-100 %] 96 % (09/07 0420) Weight:  [89.5 kg (197 lb 5 oz)] 89.5 kg (197 lb 5 oz) (09/07 0420) Weight change: -0.04 kg (-1.4 oz) Last BM Date: July 27, 2013  Intake/Output from previous day: 07/28/23 0701 - 09/07 0700 In: 240 [P.O.:240] Out: -  Intake/Output this shift:    General appearance: alert and cooperative Resp: clear to auscultation bilaterally Cardio: regular rate and rhythm, S1, S2 normal, no murmur, click, rub or gallop GI: soft, non-tender; bowel sounds normal; no masses,  no organomegaly Extremities: extremities normal, atraumatic, no cyanosis or edema Neurologic: Grossly normal   Lab Results:  Recent Labs  07-27-2013 0436 07/18/13 0440  WBC 10.7* 10.4  HGB 11.8* 12.4*  HCT 35.0* 36.3*  PLT 270 286   BMET  Recent Labs  Jul 27, 2013 0436 07/18/13 0440  NA 136 136  K 4.3 5.1  CL 103 103  CO2 24 24  GLUCOSE 116* 122*  BUN 14 10  CREATININE 1.01 1.03  CALCIUM 8.9 9.4   CMET CMP     Component Value Date/Time   NA 136 07/18/2013 0440   K 5.1 07/18/2013 0440   CL 103 07/18/2013 0440   CO2 24 07/18/2013 0440   GLUCOSE 122* 07/18/2013 0440   BUN 10 07/18/2013 0440   CREATININE 1.03 07/18/2013 0440   CALCIUM 9.4 07/18/2013 0440   PROT 7.4 07/18/2013 0440   ALBUMIN 3.0* 07/18/2013 0440   AST 22 07/18/2013 0440   ALT 24 07/18/2013 0440   ALKPHOS 74 07/18/2013 0440   BILITOT 0.6 07/18/2013 0440   GFRNONAA 72* 07/18/2013 0440   GFRAA 84* 07/18/2013 0440     Studies/Results: Dg Abd 2 Views  2013/07/27   *RADIOLOGY REPORT*  Clinical Data: Small bowel obstruction.  ABDOMEN - 2 VIEW  Comparison: 07/16/2013  Findings: The examination demonstrates persistent air and  fluid filled dilated small bowel loops with air-fluid levels.  The there is slightly less dilatation of the small bowel loops.  Air is seen throughout the colon. No evidence of free air.  Remainder of the exam is unchanged.  IMPRESSION: Continued evidence of at least partial small bowel obstruction with persistent dilated small bowel loops and air-fluid levels. Slightly less dilatation compared to the previous exam.   Original Report Authenticated By: Elberta Fortis, M.D.    Medications: I have reviewed the patient's current medications.  . famotidine  20 mg Oral BID  . feeding supplement  1 Container Oral TID BM     Assessment/Plan:  Principal Problem:   Small bowel obstruction-partial. He may be finally improving some. Await word from surgery in terms of possible d/c home if keeps doing okay. Active Problems:   Atrial fibrillation-cont. Hep gtt for now.   HTN (hypertension)-stable.   Hyperlipidemia-follow.   LOS: 10 days   Ezequiel Kayser, MD 07/18/2013, 10:10 AM

## 2013-07-18 NOTE — Progress Notes (Signed)
ANTICOAGULATION CONSULT NOTE - Follow Up Consult  Pharmacy Consult for Heparin (while Coumadin is on hold) Indication: atrial fibrillation  No Known Allergies  Patient Measurements: Height: 6' 1.5" (186.7 cm) Weight: 197 lb 5 oz (89.5 kg) IBW/kg (Calculated) : 81.05 Heparin Dosing Weight: 89.5kg  Vital Signs: Temp: 97.8 F (36.6 C) (09/07 0420) Temp src: Oral (09/07 0420) BP: 110/69 mmHg (09/07 0420) Pulse Rate: 88 (09/07 0420)  Labs:  Recent Labs  07/16/13 0510  07/17/13 0436 07/17/13 1419 07/17/13 1925 07/18/13 0440  HGB 11.9*  --  11.8*  --   --  12.4*  HCT 35.5*  --  35.0*  --   --  36.3*  PLT 249  --  270  --   --  286  LABPROT 15.9*  --  16.4*  --   --  19.3*  INR 1.30  --  1.36  --   --  1.68*  HEPARINUNFRC  --   < > <0.10* 0.55 0.54 0.29*  CREATININE 1.02  --  1.01  --   --  1.03  < > = values in this interval not displayed.  Estimated Creatinine Clearance: 77.6 ml/min (by C-G formula based on Cr of 1.03).   Medications:  Heparin @ 1750 units/hr  Assessment: 67 yo male with Afib and small bowel obstruction whose coumadin is being held in anticipation of possible surgery, though patient tolerated full liquid diet yesterday.  INR is subtherapeutic so patient is on a heparin bridge. Heparin level is just slightly below goal this morning. CBC stable. No bleeding reported.  Goal of Therapy:  Heparin level 0.3-0.7 units/ml Monitor platelets by anticoagulation protocol: Yes   Plan:  1) Increase heparin to 1850 units/hr 2) Heparin level, CBC in AM  [name] 07/18/2013,10:36 AM

## 2013-07-18 NOTE — Progress Notes (Signed)
Agree with A&P of KO,PA. Hopefully he will resolve this time around. His last surgery, by the op note, was quite difficult.

## 2013-07-18 NOTE — Progress Notes (Signed)
Patient ID: Russell Dillon, male   DOB: Jan 14, 1946, 67 y.o.   MRN: [id number]    Subjective: Pt feels ok today.  Continues to have BMs and no nausea.  Tolerating full liquids  Objective: Vital signs in last 24 hours: Temp:  [97.7 F (36.5 C)-97.9 F (36.6 C)] 97.8 F (36.6 C) (09/07 0420) Pulse Rate:  [75-97] 88 (09/07 0420) Resp:  [16-18] 18 (09/07 0420) BP: (105-110)/(63-69) 110/69 mmHg (09/07 0420) SpO2:  [96 %-100 %] 96 % (09/07 0420) Weight:  [197 lb 5 oz (89.5 kg)] 197 lb 5 oz (89.5 kg) (09/07 0420) Last BM Date: 2013/07/31  Intake/Output from previous day: 08-01-23 0701 - 09/07 0700 In: 240 [P.O.:240] Out: -  Intake/Output this shift:    PE: Abd: soft, NT, distention stable, minimal, +BS  Lab Results:   Recent Labs  July 31, 2013 0436 07/18/13 0440  WBC 10.7* 10.4  HGB 11.8* 12.4*  HCT 35.0* 36.3*  PLT 270 286   BMET  Recent Labs  2013-07-31 0436 07/18/13 0440  NA 136 136  K 4.3 5.1  CL 103 103  CO2 24 24  GLUCOSE 116* 122*  BUN 14 10  CREATININE 1.01 1.03  CALCIUM 8.9 9.4   PT/INR  Recent Labs  July 31, 2013 0436 07/18/13 0440  LABPROT 16.4* 19.3*  INR 1.36 1.68*   CMP     Component Value Date/Time   NA 136 07/18/2013 0440   K 5.1 07/18/2013 0440   CL 103 07/18/2013 0440   CO2 24 07/18/2013 0440   GLUCOSE 122* 07/18/2013 0440   BUN 10 07/18/2013 0440   CREATININE 1.03 07/18/2013 0440   CALCIUM 9.4 07/18/2013 0440   PROT 7.4 07/18/2013 0440   ALBUMIN 3.0* 07/18/2013 0440   AST 22 07/18/2013 0440   ALT 24 07/18/2013 0440   ALKPHOS 74 07/18/2013 0440   BILITOT 0.6 07/18/2013 0440   GFRNONAA 72* 07/18/2013 0440   GFRAA 84* 07/18/2013 0440   Lipase     Component Value Date/Time   LIPASE 12 07/08/2013 1647       Studies/Results: Dg Abd 2 Views  07-31-13   *RADIOLOGY REPORT*  Clinical Data: Small bowel obstruction.  ABDOMEN - 2 VIEW  Comparison: 07/16/2013  Findings: The examination demonstrates persistent air and fluid filled dilated small bowel loops with air-fluid  levels.  The there is slightly less dilatation of the small bowel loops.  Air is seen throughout the colon. No evidence of free air.  Remainder of the exam is unchanged.  IMPRESSION: Continued evidence of at least partial small bowel obstruction with persistent dilated small bowel loops and air-fluid levels. Slightly less dilatation compared to the previous exam.   Original Report Authenticated By: Elberta Fortis, M.D.    Anti-infectives: Anti-infectives   Start     Dose/Rate Route Frequency Ordered Stop   07/09/13 0046  azithromycin (ZITHROMAX) 500 mg in dextrose 5 % 250 mL IVPB  Status:  Discontinued     500 mg 250 mL/hr over 60 Minutes Intravenous Every 24 hours 07/09/13 0047 07/13/13 0935       Assessment/Plan  1. PSBO, improving  Plan: 1. Patient seems to be tolerating his full liquids well.  Will continue full liquids for today.  If he still does well with this, then he can be advanced to low fiber diet tomorrow.  He should be on a low fiber diet for at least 24 hrs given his hospital course, prior to discharge home.   LOS: 10 days  Jarl Sellitto E 07/18/2013, 12:23 PM Pager: [phone]

## 2013-07-19 LAB — CBC WITH DIFFERENTIAL/PLATELET
Basophils Absolute: 0 10*3/uL (ref 0.0–0.1)
Eosinophils Relative: 2 % (ref 0–5)
Lymphocytes Relative: 18 % (ref 12–46)
Lymphs Abs: 1.7 10*3/uL (ref 0.7–4.0)
MCV: 86.3 fL (ref 78.0–100.0)
Neutro Abs: 6.7 10*3/uL (ref 1.7–7.7)
Neutrophils Relative %: 73 % (ref 43–77)
Platelets: 243 10*3/uL (ref 150–400)
RBC: 4 MIL/uL — ABNORMAL LOW (ref 4.22–5.81)
WBC: 9.3 10*3/uL (ref 4.0–10.5)

## 2013-07-19 LAB — COMPREHENSIVE METABOLIC PANEL
Albumin: 2.6 g/dL — ABNORMAL LOW (ref 3.5–5.2)
BUN: 7 mg/dL (ref 6–23)
Chloride: 107 mEq/L (ref 96–112)
Creatinine, Ser: 1.01 mg/dL (ref 0.50–1.35)
GFR calc Af Amer: 86 mL/min — ABNORMAL LOW (ref >90)
Glucose, Bld: 115 mg/dL — ABNORMAL HIGH (ref 70–99)
Total Bilirubin: 0.5 mg/dL (ref 0.3–1.2)

## 2013-07-19 LAB — PROTIME-INR
INR: 2.43 — ABNORMAL HIGH (ref 0.00–1.49)
Prothrombin Time: 25.6 seconds — ABNORMAL HIGH (ref 11.6–15.2)

## 2013-07-19 LAB — HEPARIN LEVEL (UNFRACTIONATED): Heparin Unfractionated: 0.56 IU/mL (ref 0.30–0.70)

## 2013-07-19 MED ORDER — SODIUM CHLORIDE 0.9 % IV SOLN
INTRAVENOUS | Status: DC
  Administered 2013-07-19 – 2013-07-20 (×2): via INTRAVENOUS

## 2013-07-19 MED ORDER — BOOST PLUS PO LIQD
237.0000 mL | Freq: Three times a day (TID) | ORAL | Status: DC
  Administered 2013-07-19 – 2013-07-20 (×3): 237 mL via ORAL
  Filled 2013-07-19 (×9): qty 237

## 2013-07-19 MED ORDER — DILTIAZEM HCL 90 MG PO TABS
90.0000 mg | ORAL_TABLET | Freq: Four times a day (QID) | ORAL | Status: DC
  Administered 2013-07-19 – 2013-07-20 (×5): 90 mg via ORAL
  Filled 2013-07-19 (×9): qty 1

## 2013-07-19 NOTE — Progress Notes (Signed)
Patient ID: Russell Dillon, male   DOB: 1946-09-14, 67 y.o.   MRN: [id number]    Subjective: Pt feels well today. Denies abdominal pain. No nausea. On full liquids. Has had 4 bowel movements overnight.  Objective: Vital signs in last 24 hours: Temp:  [97.4 F (36.3 C)-98.5 F (36.9 C)] 97.8 F (36.6 C) (09/08 0428) Pulse Rate:  [56-92] 89 (09/08 0428) Resp:  [16-17] 17 (09/08 0428) BP: (100-131)/(64-73) 100/64 mmHg (09/08 0428) SpO2:  [97 %-99 %] 97 % (09/08 0428) Weight:  [196 lb 13.9 oz (89.3 kg)] 196 lb 13.9 oz (89.3 kg) (09/08 0428) Last BM Date: 07/18/13  Intake/Output from previous day: 09/07 0701 - 09/08 0700 In: 600 [P.O.:600] Out: -  Intake/Output this shift:    PE: Abd: soft, NT, mild distention stable, +BS  Lab Results:   Recent Labs  07/18/13 0440 07/19/13 0454  WBC 10.4 9.3  HGB 12.4* 11.6*  HCT 36.3* 34.5*  PLT 286 243   BMET  Recent Labs  07/18/13 0440 07/19/13 0454  NA 136 136  K 5.1 5.2*  CL 103 107  CO2 24 22  GLUCOSE 122* 115*  BUN 10 7  CREATININE 1.03 1.01  CALCIUM 9.4 8.7   PT/INR  Recent Labs  07/18/13 0440 07/19/13 0454  LABPROT 19.3* 25.6*  INR 1.68* 2.43*   CMP     Component Value Date/Time   NA 136 07/19/2013 0454   K 5.2* 07/19/2013 0454   CL 107 07/19/2013 0454   CO2 22 07/19/2013 0454   GLUCOSE 115* 07/19/2013 0454   BUN 7 07/19/2013 0454   CREATININE 1.01 07/19/2013 0454   CALCIUM 8.7 07/19/2013 0454   PROT 6.3 07/19/2013 0454   ALBUMIN 2.6* 07/19/2013 0454   AST 19 07/19/2013 0454   ALT 26 07/19/2013 0454   ALKPHOS 70 07/19/2013 0454   BILITOT 0.5 07/19/2013 0454   GFRNONAA 74* 07/19/2013 0454   GFRAA 86* 07/19/2013 0454   Lipase     Component Value Date/Time   LIPASE 12 07/08/2013 1647       Studies/Results: No results found.  Anti-infectives: Anti-infectives   Start     Dose/Rate Route Frequency Ordered Stop   07/09/13 0046  azithromycin (ZITHROMAX) 500 mg in dextrose 5 % 250 mL IVPB  Status:  Discontinued     500  mg 250 mL/hr over 60 Minutes Intravenous Every 24 hours 07/09/13 0047 07/13/13 0935       Assessment/Plan  1. PSBO, improving  Plan: 1. Agree with advancing to low fiber diet today, monitor for 24 hours. If still doing well tomorrow, likely stable for discharge to home from a surgical standpoint.  LOS: 11 days    Levert Feinstein 07/19/2013, 8:01 AM PA Pager: [phone]

## 2013-07-19 NOTE — Progress Notes (Signed)
Subjective: Tolerating full liquid diet with no abdominal pain or nausea/vomitting.  4 BM since midnight.  +flatus.    Objective: Vital signs in last 24 hours: Temp:  [97.4 F (36.3 C)-98.5 F (36.9 C)] 97.8 F (36.6 C) (09/08 0428) Pulse Rate:  [56-92] 89 (09/08 0428) Resp:  [16-17] 17 (09/08 0428) BP: (100-131)/(64-73) 100/64 mmHg (09/08 0428) SpO2:  [97 %-99 %] 97 % (09/08 0428) Weight:  [89.3 kg (196 lb 13.9 oz)] 89.3 kg (196 lb 13.9 oz) (09/08 0428) Weight change: -0.2 kg (-7.1 oz) Last BM Date: 07/18/13  CBG (last 3)  No results found for this basename: GLUCAP,  in the last 72 hours  Intake/Output from previous day: 09/07 0701 - 09/08 0700 In: 600 [P.O.:600] Out: -  Intake/Output this shift:    General appearance: alert and no distress Eyes: no scleral icterus Throat: oropharynx moist without erythema Resp: clear to auscultation bilaterally Cardio: irregularly irregular rhythm GI: soft, non-tender; hypoactive bowel sounds; no masses,  no organomegaly Extremities: no clubbing, cyanosis or edema   Lab Results:  Recent Labs  07/18/13 0440 07/19/13 0454  NA 136 136  K 5.1 5.2*  CL 103 107  CO2 24 22  GLUCOSE 122* 115*  BUN 10 7  CREATININE 1.03 1.01  CALCIUM 9.4 8.7    Recent Labs  07/18/13 0440 07/19/13 0454  AST 22 19  ALT 24 26  ALKPHOS 74 70  BILITOT 0.6 0.5  PROT 7.4 6.3  ALBUMIN 3.0* 2.6*    Recent Labs  07/18/13 0440 07/19/13 0454  WBC 10.4 9.3  NEUTROABS 8.0* 6.7  HGB 12.4* 11.6*  HCT 36.3* 34.5*  MCV 86.2 86.3  PLT 286 243   Lab Results  Component Value Date   INR 2.43* 07/19/2013   INR 1.68* 07/18/2013   INR 1.36 Jul 25, 2013   No results found for this basename: CKTOTAL, CKMB, CKMBINDEX, TROPONINI,  in the last 72 hours No results found for this basename: TSH, T4TOTAL, FREET3, T3FREE, THYROIDAB,  in the last 72 hours No results found for this basename: VITAMINB12, FOLATE, FERRITIN, TIBC, IRON, RETICCTPCT,  in the last 72  hours  Studies/Results: Dg Abd 2 Views  Jul 25, 2013   *RADIOLOGY REPORT*  Clinical Data: Small bowel obstruction.  ABDOMEN - 2 VIEW  Comparison: 07/16/2013  Findings: The examination demonstrates persistent air and fluid filled dilated small bowel loops with air-fluid levels.  The there is slightly less dilatation of the small bowel loops.  Air is seen throughout the colon. No evidence of free air.  Remainder of the exam is unchanged.  IMPRESSION: Continued evidence of at least partial small bowel obstruction with persistent dilated small bowel loops and air-fluid levels. Slightly less dilatation compared to the previous exam.   Original Report Authenticated By: Elberta Fortis, M.D.     Medications: Scheduled: . famotidine  20 mg Oral BID  . feeding supplement  1 Container Oral TID BM   Continuous: . dextrose 5 % and 0.9 % NaCl with KCl 20 mEq/L 100 mL/hr at 07/18/13 0930  . diltiazem (CARDIZEM) infusion 10 mg/hr (07/19/13 0042)  . heparin 1,850 Units/hr (07/19/13 0042)    Assessment/Plan: Principal Problem: 1. Small bowel obstruction- seems to be improving.  Will advance diet to Low Fiber X 24 hours and monitor.  Active Problems: 2. Atrial fibrillation- convert back to po Cardizem (short-acting for 24 hours, then CD).  INR is therapeutic despite withholding Coumadin due to nutritional status.  Can discontinue Heparin drip. 3. Severe protein calorie  malnutrition- Boost shakes tid (did not like Resource). 3. HTN (hypertension)- controlled on Cardizem. 4. Hyperlipidemia- resume statin after discharge. 5. Disposition- possible discharge tomorrow if tolerating low fiber diet.  Discontinue telemetry.   LOS: 11 days   Krystel Fletchall,W DOUGLAS 07/19/2013, 7:09 AM

## 2013-07-19 NOTE — Progress Notes (Signed)
I have seen and examined the patient and agree with the assessment and plans.  Sanika Brosious A. Sadhana Frater  MD, FACS  

## 2013-07-19 NOTE — Progress Notes (Signed)
ANTICOAGULATION CONSULT NOTE - Follow Up Consult  Pharmacy Consult for warfarin Indication: atrial fibrillation  No Known Allergies  Patient Measurements: Height: 6' 1.5" (186.7 cm) Weight: 196 lb 13.9 oz (89.3 kg) IBW/kg (Calculated) : 81.05   Vital Signs: Temp: 97.8 F (36.6 C) (09/08 0428) Temp src: Oral (09/08 0428) BP: 100/64 mmHg (09/08 0428) Pulse Rate: 89 (09/08 0428)  Labs:  Recent Labs  07/17/13 0436  07/17/13 1925 07/18/13 0440 07/19/13 0454  HGB 11.8*  --   --  12.4* 11.6*  HCT 35.0*  --   --  36.3* 34.5*  PLT 270  --   --  286 243  LABPROT 16.4*  --   --  19.3* 25.6*  INR 1.36  --   --  1.68* 2.43*  HEPARINUNFRC <0.10*  < > 0.54 0.29* 0.56  CREATININE 1.01  --   --  1.03 1.01  < > = values in this interval not displayed.  Estimated Creatinine Clearance: 79.2 ml/min (by C-G formula based on Cr of 1.01).   Assessment: Russell Dillon who was on chronic warfarin for AFib PTA with a dose of 5mg  daily except 2.5mg  on Sundays. His INR was elevated on admission and continued to trend up despite no warfarin being given.  On 9/1 he was given vitamin K 5mg  IV and his INR dropped to 1.42 on 9/2. Warfarin + Lovenox was started at this point because patient did not have plans for surgery. His INR increased drastically to 2.23 on 9/5 and he received another 5mg  of vitamin K IV. IV heparin was started on 9/5 d/t patient having SBO and concern he would need immediate surgery. His INR this morning is 2.43 despite not receiving warfarin since 9/4. Discussed with Dr. Clelia Croft over the phone- wants to stop heparin and continue watching the INR trend. Would not start warfarin until sometime after discharge. He suspects the changes in INR are due to nutritional status- LFTs are all WNL except albumin which is low at 2.6. Patient's SBO is improving, and he is advancing to low fiber diet today. Likely to be d/c'd tomorrow.   Goal of Therapy:  INR 2-3 Monitor platelets by anticoagulation  protocol: Yes   Plan:  -Dr. Clelia Croft requested pharmacy recommendations regarding warfarin therapy as an outpatient. Patient likely will need a lower warfarin dose and very close follow up when reinitiated on warfarin. -recommend starting warfarin 2.5mg  daily when INR is <2 -unsure how INR will react- would recommend follow up 3-5 days after resuming warfarin   Zainab Crumrine D. Fishel Wamble, PharmD Clinical Pharmacist Pager: [phone] 07/19/2013 10:42 AM

## 2013-07-19 NOTE — Progress Notes (Signed)
Notified MD Clelia Croft of elevated potassium level and current iv fluids; new order given to d/c potassium in iv fluids

## 2013-07-20 LAB — CBC WITH DIFFERENTIAL/PLATELET
Basophils Absolute: 0 10*3/uL (ref 0.0–0.1)
Basophils Relative: 0 % (ref 0–1)
Eosinophils Relative: 3 % (ref 0–5)
HCT: 31.4 % — ABNORMAL LOW (ref 39.0–52.0)
Hemoglobin: 10.5 g/dL — ABNORMAL LOW (ref 13.0–17.0)
Lymphocytes Relative: 14 % (ref 12–46)
MCHC: 33.4 g/dL (ref 30.0–36.0)
MCV: 86 fL (ref 78.0–100.0)
Monocytes Absolute: 0.6 10*3/uL (ref 0.1–1.0)
Monocytes Relative: 7 % (ref 3–12)
Neutro Abs: 6.9 10*3/uL (ref 1.7–7.7)
RDW: 14.6 % (ref 11.5–15.5)

## 2013-07-20 LAB — COMPREHENSIVE METABOLIC PANEL
ALT: 19 U/L (ref 0–53)
Albumin: 2.3 g/dL — ABNORMAL LOW (ref 3.5–5.2)
Alkaline Phosphatase: 68 U/L (ref 39–117)
Calcium: 8.2 mg/dL — ABNORMAL LOW (ref 8.4–10.5)
Potassium: 4.2 mEq/L (ref 3.5–5.1)
Sodium: 134 mEq/L — ABNORMAL LOW (ref 135–145)
Total Protein: 5.9 g/dL — ABNORMAL LOW (ref 6.0–8.3)

## 2013-07-20 MED ORDER — BOOST PLUS PO LIQD: 237.0000 mL | Freq: Three times a day (TID) | ORAL | Status: DC

## 2013-07-20 NOTE — Progress Notes (Signed)
  Subjective: Pt feels well this morning. Had several BM's last night and one this morning. BM's are starting to become more solid. Denies abdominal pain or nausea. Tolerating solid foods.  Objective: Vital signs in last 24 hours: Temp:  [98 F (36.7 C)-98.7 F (37.1 C)] 98.6 F (37 C) (09/09 0344) Pulse Rate:  [83-98] 91 (09/09 0344) Resp:  [18] 18 (09/09 0344) BP: (90-128)/(60-66) 128/66 mmHg (09/09 0344) SpO2:  [97 %-100 %] 100 % (09/09 0344) Weight:  [198 lb 8 oz (90.039 kg)] 198 lb 8 oz (90.039 kg) (09/09 0344) Last BM Date: 07/19/13  Intake/Output from previous day: 09/08 0701 - 09/09 0700 In: 1540 [P.O.:840; I.V.:700] Out: 1000 [Urine:1000] Intake/Output this shift:    PE: Abd: soft, nontender to palpation, no masses, no longer distended, +BS  Lab Results:   Recent Labs  07/19/13 0454 07/20/13 0502  WBC 9.3 9.0  HGB 11.6* 10.5*  HCT 34.5* 31.4*  PLT 243 230   BMET  Recent Labs  07/19/13 0454 07/20/13 0502  NA 136 134*  K 5.2* 4.2  CL 107 104  CO2 22 23  GLUCOSE 115* 97  BUN 7 7  CREATININE 1.01 0.92  CALCIUM 8.7 8.2*   PT/INR  Recent Labs  07/19/13 0454 07/20/13 0502  LABPROT 25.6* 28.4*  INR 2.43* 2.78*   CMP     Component Value Date/Time   NA 134* 07/20/2013 0502   K 4.2 07/20/2013 0502   CL 104 07/20/2013 0502   CO2 23 07/20/2013 0502   GLUCOSE 97 07/20/2013 0502   BUN 7 07/20/2013 0502   CREATININE 0.92 07/20/2013 0502   CALCIUM 8.2* 07/20/2013 0502   PROT 5.9* 07/20/2013 0502   ALBUMIN 2.3* 07/20/2013 0502   AST 11 07/20/2013 0502   ALT 19 07/20/2013 0502   ALKPHOS 68 07/20/2013 0502   BILITOT 0.6 07/20/2013 0502   GFRNONAA 84* 07/20/2013 0502   GFRAA >90 07/20/2013 0502   Lipase     Component Value Date/Time   LIPASE 12 07/08/2013 1647       Studies/Results: No results found.  Anti-infectives: Anti-infectives   Start     Dose/Rate Route Frequency Ordered Stop   07/09/13 0046  azithromycin (ZITHROMAX) 500 mg in dextrose 5 % 250 mL IVPB   Status:  Discontinued     500 mg 250 mL/hr over 60 Minutes Intravenous Every 24 hours 07/09/13 0047 07/13/13 0935       Assessment/Plan A: PSBO, resolved  P: Okay to discharge home from surgical standpoint. Continue low fiber diet upon discharge. No outpatient surgical follow up needed.   LOS: 12 days    Levert Feinstein 07/20/2013, 8:08 AM PA Pager: [phone]

## 2013-07-20 NOTE — Discharge Summary (Signed)
DISCHARGE SUMMARY  Russell Dillon  MR#: [id number]  DOB:June 18, 1946  Date of Admission: 07/08/2013 Date of Discharge: 07/20/2013  Attending Physician:SHAW,W DOUGLAS  Patient's VOZ:[name],[name], Dillon  Consults:  General Surgery  Discharge Diagnoses: Principal Problem:   Small bowel obstruction Active Problems:   Atrial fibrillation   HTN (hypertension)   Hyperlipidemia   Acute Bronchitis   Severe protein calorie malnutrition   Past Medical History  Diagnosis Date  . Chronic atrial fibrillation   . Hypertension   . Chronic anticoagulation   . Hyperlipidemia   . BPH (benign prostatic hypertrophy)   . Hx SBO   . Diastolic dysfunction October 2010    Normal LV systolic function  . Small bowel obstruction    Past Surgical History  Procedure Laterality Date  . Cardioversion  05/01/2004  . US echocardiography  09/05/2009    ef 55-60%  . Cardiovascular stress test  09/30/2007    EF 67%  No ischemia.     Discharge Medications:   Medication List    STOP taking these medications       warfarin 5 MG tablet  Commonly known as:  COUMADIN      TAKE these medications       atorvastatin 10 MG tablet  Commonly known as:  LIPITOR  Take 10 mg by mouth at bedtime.     diltiazem 360 MG 24 hr capsule  Commonly known as:  CARDIZEM CD  Take 360 mg by mouth at bedtime.     finasteride 5 MG tablet  Commonly known as:  PROSCAR  Take 5 mg by mouth every morning.     lactose free nutrition Liqd  Take 237 mLs by mouth 3 (three) times daily between meals.        Hospital Procedures: Dg Chest 2 View  07/08/2013   *RADIOLOGY REPORT*  Clinical Data: Cough  CHEST - 2 VIEW  Comparison: 11th 2009  Findings: The cardiac silhouette is normal in size and configuration.  The mediastinum is normal in contour caliber. There are no hilar masses.  The lungs are clear.  No pleural effusion or pneumothorax.  Mild elevation of left hemidiaphragm is stable.  The bony thorax is demineralized  but grossly intact.  IMPRESSION: No acute cardiopulmonary disease.   Original Report Authenticated By: Amie Portland, M.D.   Dg Abd 1 View  07/08/2013   *RADIOLOGY REPORT*  Clinical Data: Abdominal distension.  Diarrhea.  ABDOMEN - 1 VIEW  Comparison: 09/29/2008.  Findings: There is a paucity of colonic gas, with only some distal sigmoid gas appreciated.  Marked dilatation of the small bowel is noted throughout the entire abdomen, with bowel loops measuring up to 6.4 cm in diameter.  No definite evidence of pneumoperitoneum is appreciated on today's supine films.  Sclerotic lesion in the right ilium is again noted, favored to represent a large bone island.  IMPRESSION: 1.  Bowel gas pattern, as above, concerning for small bowel obstruction. 2.  No frank pneumoperitoneum at this time.   Original Report Authenticated By: Trudie Reed, M.D.   Ct Abdomen Pelvis W Contrast  07/08/2013   *RADIOLOGY REPORT*  Clinical Data: Abdominal pain question small bowel obstruction on radiographs, past history of hypertension, small bowel obstruction, atrial fibrillation  CT ABDOMEN AND PELVIS WITH CONTRAST  Technique:  Multidetector CT imaging of the abdomen and pelvis was performed following the standard protocol during bolus administration of intravenous contrast. Sagittal and coronal MPR images reconstructed from axial data set.  Contrast: OMNIPAQUE  IOHEXOL 300 MG/ML  SOLN Dilute oral contrast.  Comparison: 09/29/2008 Correlation:  Abdominal radiographs 07/08/2013  Findings: Lung bases clear. Dependent gallstones in gallbladder. Para esophageal herniation of fat with a small amount of accompanying fluid/edema. Liver, spleen, pancreas, and adrenal glands normal appearance. Symmetric nephrograms with small nonobstructing calculus and small cyst at inferior pole of left kidney.  Small supraumbilical ventral hernia containing fat and minimal fluid. Marked dilatation of proximal small bowel loops with decompressed distal  small bowel loops compatible with small bowel obstruction. Transition zone is at the mid small bowel, left of midline in the mid abdomen axial image 44 question adhesion. No definite bowel wall thickening or perforation. Colon contains fluid but is otherwise decompressed.  No mass, adenopathy, or free air. Unremarkable bladder with markedly enlarged prostate gland 7.5 x 7.4 x 7.1 cm. Right inguinal hernia containing fat. Scattered degenerative disc and facet disease changes of the lumbar spine with bilateral spondylolysis of L5 without significant spondylolisthesis. Scattered atherosclerotic calcifications aorta.  IMPRESSION: High-grade small bowel obstruction at the mid small bowel with transition zone in the left mid abdomen question adhesion. No definite evidence of wall thickening or perforation. Cholelithiasis. Marked prostatic enlargement. Tiny supraumbilical ventral hernia containing fat.   Original Report Authenticated By: Ulyses Southward, M.D.   Dg Abd 2 Views  07/17/2013   *RADIOLOGY REPORT*  Clinical Data: Small bowel obstruction.  ABDOMEN - 2 VIEW  Comparison: 07/16/2013  Findings: The examination demonstrates persistent air and fluid filled dilated small bowel loops with air-fluid levels.  The there is slightly less dilatation of the small bowel loops.  Air is seen throughout the colon. No evidence of free air.  Remainder of the exam is unchanged.  IMPRESSION: Continued evidence of at least partial small bowel obstruction with persistent dilated small bowel loops and air-fluid levels. Slightly less dilatation compared to the previous exam.   Original Report Authenticated By: Elberta Fortis, M.D.   Dg Abd 2 Views  07/16/2013   *RADIOLOGY REPORT*  Clinical Data: Small bowel obstruction  ABDOMEN - 2 VIEW  Comparison: 07/15/2013  Findings: Small bowel dilatation shows mild interval improvement. Small amount of gas in the colon which is nondilated.  No free air.  IMPRESSION: Mild improvement in small bowel  obstruction.   Original Report Authenticated By: Janeece Riggers, M.D.   Dg Abd 2 Views  07/15/2013   *RADIOLOGY REPORT*  Clinical Data: Abdominal distension and small bowel obstruction  ABDOMEN - 2 VIEW  Comparison: 07/12/2013  Findings: When compared the prior exam there has been a significant increase in the degree of small bowel dilatation.  Multiple air fluid levels are identified.  No free intraperitoneal air is seen. A sclerotic area is again noted in the right iliac bone.  The nasogastric catheter has been removed in the interval.  IMPRESSION: Persistent small bowel obstruction.  The number of dilated loops of small bowel has increased in the interval from prior exam.   Original Report Authenticated By: Alcide Clever, M.D.   Dg Abd 2 Views  07/12/2013   *RADIOLOGY REPORT*  Clinical Data: Small bowel obstruction.  ABDOMEN - 2 VIEW  Comparison: 07/11/2013  Findings: Nasogastric tube remains extending into the stomach. There remains dilatation of central small bowel loops up to a maximal caliber of the approximately 7 cm.  Overall appearance of small bowel dilatation is felt to be improved.  Some air and stool remains in the colon and findings are consistent with partial small bowel obstruction.  No free intraperitoneal  air is seen.  IMPRESSION: Improving partial small bowel obstruction with some continued small bowel dilatation present.   Original Report Authenticated By: Irish Lack, M.D.   Dg Abd 2 Views  07/11/2013   *RADIOLOGY REPORT*  Clinical Data: Abdominal pain  ABDOMEN - 2 VIEW  Comparison: 07/09/2013  Findings: Persistent small bowel dilatation is identified. Contrast material is noted within the colon from prior CT examination.  No definitive free air is seen.  The nasogastric catheter is now noted within the stomach.  IMPRESSION: Persistent small bowel dilatation.  No significant interval change is noted.   Original Report Authenticated By: Alcide Clever, M.D.   Dg Abd 2 Views  07/09/2013    *RADIOLOGY REPORT*  Clinical Data: Small bowel obstruction.  ABDOMEN - 2 VIEW  Comparison: Multiple priors, most recent CT 07/08/2013 to the most recent abdominal x-ray 07/08/2013  Findings: Again seen are multiple dilated loops of small bowel with air fluid levels present. The small bowel measures up to 6.9 cm in diameter.  There is gas in the colon, primarily in the ascending portion. The bladder contains contrast material. Visualized lung bases are clear.  No evidence of pneumoperitoneum.  No acute osseous abnormality.  IMPRESSION: 1. Continued evidence of small bowel obstruction, as above.  Gas now present in the colon, primarily in the ascending portion.  2. No evidence of pneumoperitoneum.   Original Report Authenticated By: Jerene Dilling, M.D.    History of Present Illness: 67 yo male with 6 day hx of diarrhea. Not A Bad case. He was eating fine until 3 days ago. The diarrhea worsened 3 days ago. He couldn't get to the bathroom. Some stool was solid, some watery, some gas and some semi-solid. Came to urgent care today for dry cough for two weeks and the diarrhea. No fever. No blood. He says mild abd tenderness since he had the oral contrast for the ct scan tonight. In the ER Ct shows possible bowel obstruction. Medical admission requested.   Hospital Course: Russell Dillon was admitted to a telemetry bed. He was treated with azithromycin for his acute bronchitis with rapid improvement. General surgery was consulted on admission for his small bowel obstruction. He was initially placed on a clear liquid diet. His bowel obstruction continued to progress clinically and radiographically to the point that he required n.p.o. status and NG suction with copious bilious drainage. This was continued for several days until NG output improved. The tube was clamped and his clear liquid diet was resumed. Initially, within 24 hours of resuming his diet he developed increasing abdominal distention and pain. His  Coumadin coagulopathy was reversed with vitamin K and he was started on a heparin drip with the intention of performing surgery once his INR normalized. However, with continued time his symptoms improved to the point that he no longer required surgery and he was able to resume a clear liquid diet. This was slowly advanced to a low fiber diet which he has been tolerating for over 24 hours. He has continued to have bowel movements throughout his hospitalization and had one solid bowel movement overnight. He denies abdominal pain, nausea, vomiting. He is having flatus.  At this point, he is felt clinically stable for discharge with apparent resolution of his partial small bowel discharge.    For his atrial fibrillation he is remain on Cardizem throughout his hospitalization. He was transitioned to IV Cardizem while n.p.o. Once his INR dropped below 2.0 heparin was started as a bridge. His INR has  drifted up despite continued withholding of his Coumadin consistent with nutritional coagulopathy. For this reason, we will hold his Coumadin until his nutritional status improves and INR normalizes. We'll recheck in one week.  Day of Discharge Exam BP 128/66  Pulse 91  Temp(Src) 98.6 F (37 C) (Oral)  Resp 18  Ht 6' 1.5" (1.867 m)  Wt 90.039 kg (198 lb 8 oz)  BMI 25.83 kg/m2  SpO2 100%  Physical Exam: General appearance: alert and no distress Eyes: no scleral icterus Throat: oropharynx moist without erythema Resp: clear to auscultation bilaterally Cardio: irregularly irregular rhythm GI: soft, non-tender; bowel sounds normal; no masses,  no organomegaly Extremities: no clubbing, cyanosis or edema  Discharge Labs:  Recent Labs  07/19/13 0454 07/20/13 0502  NA 136 134*  K 5.2* 4.2  CL 107 104  CO2 22 23  GLUCOSE 115* 97  BUN 7 7  CREATININE 1.01 0.92  CALCIUM 8.7 8.2*    Recent Labs  07/19/13 0454 07/20/13 0502  AST 19 11  ALT 26 19  ALKPHOS 70 68  BILITOT 0.5 0.6  PROT 6.3 5.9*   ALBUMIN 2.6* 2.3*    Recent Labs  07/19/13 0454 07/20/13 0502  WBC 9.3 9.0  NEUTROABS 6.7 6.9  HGB 11.6* 10.5*  HCT 34.5* 31.4*  MCV 86.3 86.0  PLT 243 230   Lab Results  Component Value Date   INR 2.78* 07/20/2013   INR 2.43* 07/19/2013   INR 1.68* 07/18/2013    Discharge instructions:     Discharge Orders   Future Appointments Provider Department Dept Phone   08/02/2013 10:45 AM [id number]  [facility] [phone]   Future Orders Complete By Expires   Diet general  As directed    Scheduling Instructions:     Low fiber   Discharge instructions  As directed    Comments:     Call if you have increasing abdominal pain, distention, nausea/vomiting, or no bowel movement for 3 days. Stay off Coumadin until INR is checked in 1 week.   Increase activity slowly  As directed       Disposition: To home  Follow-up Appts: Follow-up with Dr. Clelia Croft at [facility] in 1 week. Our office will call him for appointment.  Condition on Discharge: Stable   Tests Needing Follow-up:  Check INR  Time with discharge activity: 40 minutes  Signed: SHAW,W DOUGLAS 07/20/2013, 9:31 AM

## 2013-07-20 NOTE — Progress Notes (Signed)
I have seen and examined the patient and agree with the assessment and plans.  Galadriel Shroff A. Christoher Drudge  MD, FACS  

## 2013-07-27 DIAGNOSIS — Z23 Encounter for immunization: Secondary | ICD-10-CM | POA: Diagnosis not present

## 2013-08-02 ENCOUNTER — Ambulatory Visit (INDEPENDENT_AMBULATORY_CARE_PROVIDER_SITE_OTHER): Payer: Medicare Other | Admitting: Pharmacist

## 2013-08-02 DIAGNOSIS — I4891 Unspecified atrial fibrillation: Secondary | ICD-10-CM | POA: Diagnosis not present

## 2013-08-02 DIAGNOSIS — Z7901 Long term (current) use of anticoagulants: Secondary | ICD-10-CM

## 2013-08-02 LAB — POCT INR: INR: 1.3

## 2013-08-10 ENCOUNTER — Ambulatory Visit (INDEPENDENT_AMBULATORY_CARE_PROVIDER_SITE_OTHER): Payer: Medicare Other | Admitting: Pharmacist

## 2013-08-10 DIAGNOSIS — Z7901 Long term (current) use of anticoagulants: Secondary | ICD-10-CM | POA: Diagnosis not present

## 2013-08-10 DIAGNOSIS — I4891 Unspecified atrial fibrillation: Secondary | ICD-10-CM | POA: Diagnosis not present

## 2013-08-10 LAB — POCT INR: INR: 2

## 2013-08-16 ENCOUNTER — Other Ambulatory Visit: Payer: Self-pay | Admitting: Cardiology

## 2013-08-18 DIAGNOSIS — Z6826 Body mass index (BMI) 26.0-26.9, adult: Secondary | ICD-10-CM | POA: Diagnosis not present

## 2013-08-18 DIAGNOSIS — E7800 Pure hypercholesterolemia, unspecified: Secondary | ICD-10-CM | POA: Diagnosis not present

## 2013-08-18 DIAGNOSIS — I4891 Unspecified atrial fibrillation: Secondary | ICD-10-CM | POA: Diagnosis not present

## 2013-08-18 DIAGNOSIS — I10 Essential (primary) hypertension: Secondary | ICD-10-CM | POA: Diagnosis not present

## 2013-08-18 DIAGNOSIS — K56609 Unspecified intestinal obstruction, unspecified as to partial versus complete obstruction: Secondary | ICD-10-CM | POA: Diagnosis not present

## 2013-09-02 ENCOUNTER — Ambulatory Visit (INDEPENDENT_AMBULATORY_CARE_PROVIDER_SITE_OTHER): Payer: Medicare Other | Admitting: *Deleted

## 2013-09-02 DIAGNOSIS — Z7901 Long term (current) use of anticoagulants: Secondary | ICD-10-CM | POA: Diagnosis not present

## 2013-09-02 DIAGNOSIS — I4891 Unspecified atrial fibrillation: Secondary | ICD-10-CM | POA: Diagnosis not present

## 2013-09-13 ENCOUNTER — Ambulatory Visit (INDEPENDENT_AMBULATORY_CARE_PROVIDER_SITE_OTHER): Payer: Medicare Other | Admitting: Pharmacist

## 2013-09-13 DIAGNOSIS — Z7901 Long term (current) use of anticoagulants: Secondary | ICD-10-CM | POA: Diagnosis not present

## 2013-09-13 DIAGNOSIS — I4891 Unspecified atrial fibrillation: Secondary | ICD-10-CM | POA: Diagnosis not present

## 2013-09-13 LAB — POCT INR: INR: 3.5

## 2013-09-27 ENCOUNTER — Ambulatory Visit (INDEPENDENT_AMBULATORY_CARE_PROVIDER_SITE_OTHER): Payer: Medicare Other | Admitting: Pharmacist

## 2013-09-27 DIAGNOSIS — Z7901 Long term (current) use of anticoagulants: Secondary | ICD-10-CM

## 2013-09-27 DIAGNOSIS — I4891 Unspecified atrial fibrillation: Secondary | ICD-10-CM | POA: Diagnosis not present

## 2013-09-27 LAB — POCT INR: INR: 3.2

## 2013-09-30 ENCOUNTER — Encounter: Payer: Self-pay | Admitting: Cardiology

## 2013-09-30 DIAGNOSIS — R7301 Impaired fasting glucose: Secondary | ICD-10-CM | POA: Diagnosis not present

## 2013-09-30 DIAGNOSIS — I10 Essential (primary) hypertension: Secondary | ICD-10-CM | POA: Diagnosis not present

## 2013-09-30 DIAGNOSIS — Z125 Encounter for screening for malignant neoplasm of prostate: Secondary | ICD-10-CM | POA: Diagnosis not present

## 2013-09-30 DIAGNOSIS — E7800 Pure hypercholesterolemia, unspecified: Secondary | ICD-10-CM | POA: Diagnosis not present

## 2013-10-10 ENCOUNTER — Other Ambulatory Visit: Payer: Self-pay | Admitting: Cardiology

## 2013-10-12 DIAGNOSIS — I10 Essential (primary) hypertension: Secondary | ICD-10-CM | POA: Diagnosis not present

## 2013-10-12 DIAGNOSIS — Z7901 Long term (current) use of anticoagulants: Secondary | ICD-10-CM | POA: Diagnosis not present

## 2013-10-12 DIAGNOSIS — Z0000 Encounter for general adult medical examination without abnormal findings: Secondary | ICD-10-CM | POA: Diagnosis not present

## 2013-10-12 DIAGNOSIS — R972 Elevated prostate specific antigen [PSA]: Secondary | ICD-10-CM | POA: Diagnosis not present

## 2013-10-12 DIAGNOSIS — R7301 Impaired fasting glucose: Secondary | ICD-10-CM | POA: Diagnosis not present

## 2013-10-12 DIAGNOSIS — Z8719 Personal history of other diseases of the digestive system: Secondary | ICD-10-CM | POA: Diagnosis not present

## 2013-10-12 DIAGNOSIS — Z23 Encounter for immunization: Secondary | ICD-10-CM | POA: Diagnosis not present

## 2013-10-12 DIAGNOSIS — E7800 Pure hypercholesterolemia, unspecified: Secondary | ICD-10-CM | POA: Diagnosis not present

## 2013-10-12 DIAGNOSIS — I4891 Unspecified atrial fibrillation: Secondary | ICD-10-CM | POA: Diagnosis not present

## 2013-10-14 ENCOUNTER — Ambulatory Visit (INDEPENDENT_AMBULATORY_CARE_PROVIDER_SITE_OTHER): Payer: Medicare Other | Admitting: *Deleted

## 2013-10-14 DIAGNOSIS — Z7901 Long term (current) use of anticoagulants: Secondary | ICD-10-CM | POA: Diagnosis not present

## 2013-10-14 DIAGNOSIS — I4891 Unspecified atrial fibrillation: Secondary | ICD-10-CM

## 2013-10-14 LAB — POCT INR: INR: 2.3

## 2013-11-05 ENCOUNTER — Ambulatory Visit (INDEPENDENT_AMBULATORY_CARE_PROVIDER_SITE_OTHER): Payer: Medicare Other | Admitting: Pharmacist

## 2013-11-05 DIAGNOSIS — Z7901 Long term (current) use of anticoagulants: Secondary | ICD-10-CM

## 2013-11-05 DIAGNOSIS — I4891 Unspecified atrial fibrillation: Secondary | ICD-10-CM

## 2013-11-10 ENCOUNTER — Other Ambulatory Visit: Payer: Self-pay

## 2013-11-10 MED ORDER — DILTIAZEM HCL ER COATED BEADS 360 MG PO CP24: ORAL_CAPSULE | ORAL | Status: DC

## 2013-11-11 ENCOUNTER — Other Ambulatory Visit: Payer: Self-pay | Admitting: Cardiology

## 2013-11-12 ENCOUNTER — Other Ambulatory Visit: Payer: Self-pay | Admitting: *Deleted

## 2013-11-15 ENCOUNTER — Ambulatory Visit (INDEPENDENT_AMBULATORY_CARE_PROVIDER_SITE_OTHER): Payer: Medicare Other | Admitting: *Deleted

## 2013-11-15 DIAGNOSIS — Z7901 Long term (current) use of anticoagulants: Secondary | ICD-10-CM

## 2013-11-15 DIAGNOSIS — I4891 Unspecified atrial fibrillation: Secondary | ICD-10-CM

## 2013-11-15 LAB — POCT INR: INR: 1.8

## 2013-12-02 ENCOUNTER — Ambulatory Visit (INDEPENDENT_AMBULATORY_CARE_PROVIDER_SITE_OTHER): Payer: Medicare Other

## 2013-12-02 DIAGNOSIS — Z5181 Encounter for therapeutic drug level monitoring: Secondary | ICD-10-CM

## 2013-12-02 DIAGNOSIS — I4891 Unspecified atrial fibrillation: Secondary | ICD-10-CM | POA: Diagnosis not present

## 2013-12-02 DIAGNOSIS — Z7901 Long term (current) use of anticoagulants: Secondary | ICD-10-CM | POA: Diagnosis not present

## 2013-12-02 LAB — POCT INR: INR: 3.8

## 2013-12-15 DIAGNOSIS — L821 Other seborrheic keratosis: Secondary | ICD-10-CM | POA: Diagnosis not present

## 2013-12-15 DIAGNOSIS — L570 Actinic keratosis: Secondary | ICD-10-CM | POA: Diagnosis not present

## 2013-12-15 DIAGNOSIS — D2370 Other benign neoplasm of skin of unspecified lower limb, including hip: Secondary | ICD-10-CM | POA: Diagnosis not present

## 2013-12-15 DIAGNOSIS — D239 Other benign neoplasm of skin, unspecified: Secondary | ICD-10-CM | POA: Diagnosis not present

## 2013-12-15 DIAGNOSIS — D1801 Hemangioma of skin and subcutaneous tissue: Secondary | ICD-10-CM | POA: Diagnosis not present

## 2013-12-16 ENCOUNTER — Ambulatory Visit (INDEPENDENT_AMBULATORY_CARE_PROVIDER_SITE_OTHER): Payer: Medicare Other

## 2013-12-16 DIAGNOSIS — Z5181 Encounter for therapeutic drug level monitoring: Secondary | ICD-10-CM | POA: Diagnosis not present

## 2013-12-16 DIAGNOSIS — I4891 Unspecified atrial fibrillation: Secondary | ICD-10-CM | POA: Diagnosis not present

## 2013-12-16 DIAGNOSIS — Z7901 Long term (current) use of anticoagulants: Secondary | ICD-10-CM | POA: Diagnosis not present

## 2013-12-16 LAB — POCT INR: INR: 2.8

## 2013-12-29 DIAGNOSIS — R972 Elevated prostate specific antigen [PSA]: Secondary | ICD-10-CM | POA: Diagnosis not present

## 2013-12-29 DIAGNOSIS — N400 Enlarged prostate without lower urinary tract symptoms: Secondary | ICD-10-CM | POA: Diagnosis not present

## 2013-12-29 DIAGNOSIS — N471 Phimosis: Secondary | ICD-10-CM | POA: Diagnosis not present

## 2013-12-29 DIAGNOSIS — N478 Other disorders of prepuce: Secondary | ICD-10-CM | POA: Diagnosis not present

## 2014-01-07 ENCOUNTER — Ambulatory Visit (INDEPENDENT_AMBULATORY_CARE_PROVIDER_SITE_OTHER): Payer: Medicare Other | Admitting: *Deleted

## 2014-01-07 ENCOUNTER — Encounter: Payer: Self-pay | Admitting: Cardiology

## 2014-01-07 ENCOUNTER — Ambulatory Visit (INDEPENDENT_AMBULATORY_CARE_PROVIDER_SITE_OTHER): Payer: Medicare Other | Admitting: Cardiology

## 2014-01-07 VITALS — BP 122/73 | HR 86 | Ht 73.5 in | Wt 189.1 lb

## 2014-01-07 DIAGNOSIS — Z5181 Encounter for therapeutic drug level monitoring: Secondary | ICD-10-CM

## 2014-01-07 DIAGNOSIS — Z7901 Long term (current) use of anticoagulants: Secondary | ICD-10-CM | POA: Diagnosis not present

## 2014-01-07 DIAGNOSIS — I4891 Unspecified atrial fibrillation: Secondary | ICD-10-CM

## 2014-01-07 DIAGNOSIS — E785 Hyperlipidemia, unspecified: Secondary | ICD-10-CM

## 2014-01-07 DIAGNOSIS — Z79899 Other long term (current) drug therapy: Secondary | ICD-10-CM | POA: Diagnosis not present

## 2014-01-07 LAB — CBC
HEMATOCRIT: 37.8 % — AB (ref 39.0–52.0)
Hemoglobin: 12.1 g/dL — ABNORMAL LOW (ref 13.0–17.0)
MCHC: 32.1 g/dL (ref 30.0–36.0)
MCV: 88.4 fl (ref 78.0–100.0)
Platelets: 250 10*3/uL (ref 150.0–400.0)
RBC: 4.27 Mil/uL (ref 4.22–5.81)
RDW: 14.7 % — AB (ref 11.5–14.6)
WBC: 8.4 10*3/uL (ref 4.5–10.5)

## 2014-01-07 LAB — POCT INR: INR: 4.1

## 2014-01-07 NOTE — Patient Instructions (Signed)
Your physician recommends that you return for lab work today for CBC.  Your physician recommends that you continue on your current medications as directed. Please refer to the Current Medication list given to you today.  Your physician wants you to follow-up in: 6 months with Dr. Aundra Dubin. You will receive a reminder letter in the mail two months in advance. If you don't receive a letter, please call our office to schedule the follow-up appointment.

## 2014-01-09 NOTE — Progress Notes (Signed)
Patient ID: Russell Dillon, male   DOB: Jul 01, 1946, 68 y.o.   MRN: [id number] PCP: Dr. Brigitte Pulse  68 yo with history of chronic atrial fibrillation presents for cardiology followup.  He has been doing well in general.  He does not feel palpitations.  He is generally active.  No exertional dyspnea or chest pain.  No bleeding problems with warfarin, no falls.  Does all his yardwork.  HR reasonably controlled.  ECG: atrial fibrillation, otherwise normal  Labs (10/12): HCT 40.5, K 4.2, creatinine 1.0, LDL 73, HDL 54 Labs (11/13): K 4.3, creatinine 1.0, LDL 79, HDL 58, TSH normal Labs (11/14): K 4, creatinine 0.9, LDL 64, HDL 72  PMH: 1. Chronic atrial fibrillation since 2005.  Echo (10/10) with EF 55-60%.  2. Stress myoview (11/08): EF 67%, no ischemia or infarction.  3. HTN 4. BPH 5. Hyperlipidemia 6. Partial SBO: Most recent episode in 8/14.    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  Married.  Never smoked.  Active with [facility] [employer].  Daughter is professor at [employer].   FH: Mother with angina  Current Outpatient Prescriptions  Medication Sig Dispense Refill  . atorvastatin (LIPITOR) 10 MG tablet Take 10 mg by mouth at bedtime.      Marland Kitchen diltiazem (CARDIZEM CD) 360 MG 24 hr capsule TAKE ONE CAPSULE BY MOUTH DAILY  90 capsule  1  . finasteride (PROSCAR) 5 MG tablet Take 5 mg by mouth every morning.      . lactose free nutrition (BOOST PLUS) LIQD Take 237 mLs by mouth 3 (three) times daily between meals.    0  . warfarin (COUMADIN) 5 MG tablet TAKE AS DIRECTED BY COUMDAIN CLINIC  90 tablet  0   No current facility-administered medications for this visit.   BP 122/73  Pulse 86  Ht 6' 1.5" (1.867 m)  Wt 85.784 kg (189 lb 1.9 oz)  BMI 24.61 kg/m2 General: NAD Neck: No JVD, no thyromegaly or thyroid nodule.  Lungs: Clear to auscultation bilaterally with normal respiratory effort. CV: Nondisplaced PMI.  Heart irregular  S1/S2, no S3/S4, no murmur.  No edema.  No carotid bruit.  Normal pedal pulses.  Abdomen: Soft, nontender, no hepatosplenomegaly, no distention.  Neurologic: Alert and oriented x 3.  Psych: Normal affect. Extremities: No clubbing or cyanosis.   Assessment/Plan:  Atrial fibrillation  Chronic since 2005. Good rate control, continue diltiazem CD. Last echo in 2010 with normal EF.  He is on warfarin and has not had a lot of problems with it.  I have offered to let him switch to apixaban or Xarelto but he wants to continue warfarin (only getting INRs every 6 wks now).  Will get CBC today given warfarin use.  Hyperlipidemia  Good lipids in 11/14.   Loralie Champagne 01/09/2014

## 2014-01-21 ENCOUNTER — Ambulatory Visit (INDEPENDENT_AMBULATORY_CARE_PROVIDER_SITE_OTHER): Payer: Medicare Other | Admitting: Pharmacist

## 2014-01-21 DIAGNOSIS — I4891 Unspecified atrial fibrillation: Secondary | ICD-10-CM

## 2014-01-21 DIAGNOSIS — Z5181 Encounter for therapeutic drug level monitoring: Secondary | ICD-10-CM

## 2014-01-21 DIAGNOSIS — Z7901 Long term (current) use of anticoagulants: Secondary | ICD-10-CM | POA: Diagnosis not present

## 2014-01-21 LAB — POCT INR: INR: 3.3

## 2014-01-22 ENCOUNTER — Other Ambulatory Visit: Payer: Self-pay | Admitting: Cardiology

## 2014-02-04 ENCOUNTER — Ambulatory Visit (INDEPENDENT_AMBULATORY_CARE_PROVIDER_SITE_OTHER): Payer: Medicare Other | Admitting: Pharmacist

## 2014-02-04 DIAGNOSIS — I4891 Unspecified atrial fibrillation: Secondary | ICD-10-CM | POA: Diagnosis not present

## 2014-02-04 DIAGNOSIS — Z5181 Encounter for therapeutic drug level monitoring: Secondary | ICD-10-CM | POA: Diagnosis not present

## 2014-02-04 DIAGNOSIS — Z7901 Long term (current) use of anticoagulants: Secondary | ICD-10-CM | POA: Diagnosis not present

## 2014-02-04 LAB — POCT INR: INR: 2.5

## 2014-02-25 ENCOUNTER — Ambulatory Visit (INDEPENDENT_AMBULATORY_CARE_PROVIDER_SITE_OTHER): Payer: Medicare Other | Admitting: *Deleted

## 2014-02-25 DIAGNOSIS — Z7901 Long term (current) use of anticoagulants: Secondary | ICD-10-CM

## 2014-02-25 DIAGNOSIS — Z5181 Encounter for therapeutic drug level monitoring: Secondary | ICD-10-CM | POA: Diagnosis not present

## 2014-02-25 DIAGNOSIS — I4891 Unspecified atrial fibrillation: Secondary | ICD-10-CM | POA: Diagnosis not present

## 2014-02-25 LAB — POCT INR: INR: 2.6

## 2014-03-25 ENCOUNTER — Ambulatory Visit (INDEPENDENT_AMBULATORY_CARE_PROVIDER_SITE_OTHER): Payer: Medicare Other | Admitting: *Deleted

## 2014-03-25 DIAGNOSIS — Z5181 Encounter for therapeutic drug level monitoring: Secondary | ICD-10-CM

## 2014-03-25 DIAGNOSIS — I4891 Unspecified atrial fibrillation: Secondary | ICD-10-CM | POA: Diagnosis not present

## 2014-03-25 DIAGNOSIS — Z7901 Long term (current) use of anticoagulants: Secondary | ICD-10-CM | POA: Diagnosis not present

## 2014-03-25 LAB — POCT INR: INR: 2.9

## 2014-04-16 DIAGNOSIS — H2510 Age-related nuclear cataract, unspecified eye: Secondary | ICD-10-CM | POA: Diagnosis not present

## 2014-04-18 ENCOUNTER — Ambulatory Visit (INDEPENDENT_AMBULATORY_CARE_PROVIDER_SITE_OTHER): Payer: Medicare Other | Admitting: Surgery

## 2014-04-18 DIAGNOSIS — Z5181 Encounter for therapeutic drug level monitoring: Secondary | ICD-10-CM

## 2014-04-18 DIAGNOSIS — Z7901 Long term (current) use of anticoagulants: Secondary | ICD-10-CM | POA: Diagnosis not present

## 2014-04-18 DIAGNOSIS — I4891 Unspecified atrial fibrillation: Secondary | ICD-10-CM | POA: Diagnosis not present

## 2014-04-18 LAB — POCT INR: INR: 2

## 2014-04-29 ENCOUNTER — Other Ambulatory Visit: Payer: Self-pay | Admitting: Cardiology

## 2014-05-10 ENCOUNTER — Ambulatory Visit (INDEPENDENT_AMBULATORY_CARE_PROVIDER_SITE_OTHER): Payer: Medicare Other | Admitting: Pharmacist Clinician (PhC)/ Clinical Pharmacy Specialist

## 2014-05-10 DIAGNOSIS — Z5181 Encounter for therapeutic drug level monitoring: Secondary | ICD-10-CM | POA: Diagnosis not present

## 2014-05-10 DIAGNOSIS — I4891 Unspecified atrial fibrillation: Secondary | ICD-10-CM | POA: Diagnosis not present

## 2014-05-10 DIAGNOSIS — Z7901 Long term (current) use of anticoagulants: Secondary | ICD-10-CM | POA: Diagnosis not present

## 2014-05-10 LAB — POCT INR: INR: 1.9

## 2014-05-14 ENCOUNTER — Other Ambulatory Visit: Payer: Self-pay | Admitting: Cardiology

## 2014-06-15 ENCOUNTER — Ambulatory Visit (INDEPENDENT_AMBULATORY_CARE_PROVIDER_SITE_OTHER): Payer: Medicare Other | Admitting: Pharmacist

## 2014-06-15 DIAGNOSIS — I4891 Unspecified atrial fibrillation: Secondary | ICD-10-CM | POA: Diagnosis not present

## 2014-06-15 DIAGNOSIS — Z5181 Encounter for therapeutic drug level monitoring: Secondary | ICD-10-CM

## 2014-06-15 DIAGNOSIS — Z7901 Long term (current) use of anticoagulants: Secondary | ICD-10-CM | POA: Diagnosis not present

## 2014-06-15 LAB — POCT INR: INR: 2

## 2014-07-13 ENCOUNTER — Ambulatory Visit (INDEPENDENT_AMBULATORY_CARE_PROVIDER_SITE_OTHER): Payer: Medicare Other | Admitting: *Deleted

## 2014-07-13 DIAGNOSIS — Z7901 Long term (current) use of anticoagulants: Secondary | ICD-10-CM

## 2014-07-13 DIAGNOSIS — I4891 Unspecified atrial fibrillation: Secondary | ICD-10-CM | POA: Diagnosis not present

## 2014-07-13 DIAGNOSIS — Z5181 Encounter for therapeutic drug level monitoring: Secondary | ICD-10-CM

## 2014-07-13 LAB — POCT INR: INR: 2.3

## 2014-08-10 ENCOUNTER — Ambulatory Visit (INDEPENDENT_AMBULATORY_CARE_PROVIDER_SITE_OTHER): Payer: Medicare Other | Admitting: Pharmacist

## 2014-08-10 DIAGNOSIS — Z5181 Encounter for therapeutic drug level monitoring: Secondary | ICD-10-CM | POA: Diagnosis not present

## 2014-08-10 DIAGNOSIS — I4891 Unspecified atrial fibrillation: Secondary | ICD-10-CM

## 2014-08-10 DIAGNOSIS — Z7901 Long term (current) use of anticoagulants: Secondary | ICD-10-CM | POA: Diagnosis not present

## 2014-08-10 LAB — POCT INR: INR: 1.9

## 2014-09-07 ENCOUNTER — Ambulatory Visit (INDEPENDENT_AMBULATORY_CARE_PROVIDER_SITE_OTHER): Payer: Medicare Other | Admitting: *Deleted

## 2014-09-07 DIAGNOSIS — Z7901 Long term (current) use of anticoagulants: Secondary | ICD-10-CM | POA: Diagnosis not present

## 2014-09-07 DIAGNOSIS — Z5181 Encounter for therapeutic drug level monitoring: Secondary | ICD-10-CM

## 2014-09-07 DIAGNOSIS — I4891 Unspecified atrial fibrillation: Secondary | ICD-10-CM

## 2014-09-07 LAB — POCT INR: INR: 1.5

## 2014-09-21 ENCOUNTER — Ambulatory Visit (INDEPENDENT_AMBULATORY_CARE_PROVIDER_SITE_OTHER): Payer: Medicare Other | Admitting: *Deleted

## 2014-09-21 DIAGNOSIS — I4891 Unspecified atrial fibrillation: Secondary | ICD-10-CM | POA: Diagnosis not present

## 2014-09-21 DIAGNOSIS — Z5181 Encounter for therapeutic drug level monitoring: Secondary | ICD-10-CM

## 2014-09-21 DIAGNOSIS — Z7901 Long term (current) use of anticoagulants: Secondary | ICD-10-CM | POA: Diagnosis not present

## 2014-09-21 LAB — POCT INR: INR: 1.6

## 2014-09-23 ENCOUNTER — Other Ambulatory Visit: Payer: Self-pay | Admitting: Cardiology

## 2014-10-04 ENCOUNTER — Ambulatory Visit (INDEPENDENT_AMBULATORY_CARE_PROVIDER_SITE_OTHER): Payer: Medicare Other

## 2014-10-04 DIAGNOSIS — I4891 Unspecified atrial fibrillation: Secondary | ICD-10-CM

## 2014-10-04 DIAGNOSIS — Z7901 Long term (current) use of anticoagulants: Secondary | ICD-10-CM | POA: Diagnosis not present

## 2014-10-04 DIAGNOSIS — Z5181 Encounter for therapeutic drug level monitoring: Secondary | ICD-10-CM | POA: Diagnosis not present

## 2014-10-04 LAB — POCT INR: INR: 2.2

## 2014-10-18 ENCOUNTER — Encounter: Payer: Self-pay | Admitting: Cardiology

## 2014-10-18 DIAGNOSIS — I10 Essential (primary) hypertension: Secondary | ICD-10-CM | POA: Diagnosis not present

## 2014-10-18 DIAGNOSIS — Z0000 Encounter for general adult medical examination without abnormal findings: Secondary | ICD-10-CM | POA: Diagnosis not present

## 2014-10-18 DIAGNOSIS — R7301 Impaired fasting glucose: Secondary | ICD-10-CM | POA: Diagnosis not present

## 2014-10-18 DIAGNOSIS — Z125 Encounter for screening for malignant neoplasm of prostate: Secondary | ICD-10-CM | POA: Diagnosis not present

## 2014-10-18 DIAGNOSIS — E780 Pure hypercholesterolemia: Secondary | ICD-10-CM | POA: Diagnosis not present

## 2014-10-25 ENCOUNTER — Ambulatory Visit (INDEPENDENT_AMBULATORY_CARE_PROVIDER_SITE_OTHER): Payer: Medicare Other | Admitting: *Deleted

## 2014-10-25 DIAGNOSIS — I10 Essential (primary) hypertension: Secondary | ICD-10-CM | POA: Diagnosis not present

## 2014-10-25 DIAGNOSIS — Z0000 Encounter for general adult medical examination without abnormal findings: Secondary | ICD-10-CM | POA: Diagnosis not present

## 2014-10-25 DIAGNOSIS — I480 Paroxysmal atrial fibrillation: Secondary | ICD-10-CM | POA: Diagnosis not present

## 2014-10-25 DIAGNOSIS — Z8719 Personal history of other diseases of the digestive system: Secondary | ICD-10-CM | POA: Diagnosis not present

## 2014-10-25 DIAGNOSIS — Z5181 Encounter for therapeutic drug level monitoring: Secondary | ICD-10-CM | POA: Diagnosis not present

## 2014-10-25 DIAGNOSIS — I4891 Unspecified atrial fibrillation: Secondary | ICD-10-CM

## 2014-10-25 DIAGNOSIS — R972 Elevated prostate specific antigen [PSA]: Secondary | ICD-10-CM | POA: Diagnosis not present

## 2014-10-25 DIAGNOSIS — Z7901 Long term (current) use of anticoagulants: Secondary | ICD-10-CM | POA: Diagnosis not present

## 2014-10-25 DIAGNOSIS — E780 Pure hypercholesterolemia: Secondary | ICD-10-CM | POA: Diagnosis not present

## 2014-10-25 DIAGNOSIS — R7301 Impaired fasting glucose: Secondary | ICD-10-CM | POA: Diagnosis not present

## 2014-10-25 DIAGNOSIS — Z1389 Encounter for screening for other disorder: Secondary | ICD-10-CM | POA: Diagnosis not present

## 2014-10-25 LAB — POCT INR: INR: 2.2

## 2014-11-22 ENCOUNTER — Ambulatory Visit (INDEPENDENT_AMBULATORY_CARE_PROVIDER_SITE_OTHER): Payer: Medicare Other | Admitting: *Deleted

## 2014-11-22 DIAGNOSIS — Z5181 Encounter for therapeutic drug level monitoring: Secondary | ICD-10-CM | POA: Diagnosis not present

## 2014-11-22 DIAGNOSIS — Z7901 Long term (current) use of anticoagulants: Secondary | ICD-10-CM | POA: Diagnosis not present

## 2014-11-22 DIAGNOSIS — I4891 Unspecified atrial fibrillation: Secondary | ICD-10-CM

## 2014-11-22 LAB — POCT INR: INR: 2.7

## 2014-11-28 ENCOUNTER — Other Ambulatory Visit: Payer: Self-pay | Admitting: Cardiology

## 2014-12-15 DIAGNOSIS — D2272 Melanocytic nevi of left lower limb, including hip: Secondary | ICD-10-CM | POA: Diagnosis not present

## 2014-12-15 DIAGNOSIS — L570 Actinic keratosis: Secondary | ICD-10-CM | POA: Diagnosis not present

## 2014-12-15 DIAGNOSIS — D1801 Hemangioma of skin and subcutaneous tissue: Secondary | ICD-10-CM | POA: Diagnosis not present

## 2014-12-23 ENCOUNTER — Encounter: Payer: Self-pay | Admitting: Gastroenterology

## 2014-12-30 ENCOUNTER — Other Ambulatory Visit: Payer: Self-pay

## 2014-12-30 MED ORDER — DILTIAZEM HCL ER COATED BEADS 360 MG PO CP24: ORAL_CAPSULE | ORAL | Status: DC

## 2015-01-03 ENCOUNTER — Ambulatory Visit (INDEPENDENT_AMBULATORY_CARE_PROVIDER_SITE_OTHER): Payer: Medicare Other | Admitting: *Deleted

## 2015-01-03 DIAGNOSIS — Z5181 Encounter for therapeutic drug level monitoring: Secondary | ICD-10-CM

## 2015-01-03 DIAGNOSIS — Z7901 Long term (current) use of anticoagulants: Secondary | ICD-10-CM | POA: Diagnosis not present

## 2015-01-03 DIAGNOSIS — I4891 Unspecified atrial fibrillation: Secondary | ICD-10-CM

## 2015-01-03 LAB — POCT INR: INR: 2.5

## 2015-02-14 ENCOUNTER — Ambulatory Visit (INDEPENDENT_AMBULATORY_CARE_PROVIDER_SITE_OTHER): Payer: Medicare Other | Admitting: *Deleted

## 2015-02-14 DIAGNOSIS — Z7901 Long term (current) use of anticoagulants: Secondary | ICD-10-CM

## 2015-02-14 DIAGNOSIS — I4891 Unspecified atrial fibrillation: Secondary | ICD-10-CM

## 2015-02-14 DIAGNOSIS — Z5181 Encounter for therapeutic drug level monitoring: Secondary | ICD-10-CM | POA: Diagnosis not present

## 2015-02-14 LAB — POCT INR: INR: 3.1

## 2015-02-23 ENCOUNTER — Other Ambulatory Visit: Payer: Self-pay | Admitting: Cardiology

## 2015-03-20 ENCOUNTER — Ambulatory Visit (INDEPENDENT_AMBULATORY_CARE_PROVIDER_SITE_OTHER): Payer: Medicare Other | Admitting: Cardiology

## 2015-03-20 ENCOUNTER — Ambulatory Visit (INDEPENDENT_AMBULATORY_CARE_PROVIDER_SITE_OTHER): Payer: Medicare Other | Admitting: *Deleted

## 2015-03-20 ENCOUNTER — Encounter: Payer: Self-pay | Admitting: Cardiology

## 2015-03-20 VITALS — BP 124/74 | HR 94 | Ht 73.5 in | Wt 187.0 lb

## 2015-03-20 DIAGNOSIS — Z7901 Long term (current) use of anticoagulants: Secondary | ICD-10-CM | POA: Diagnosis not present

## 2015-03-20 DIAGNOSIS — I4891 Unspecified atrial fibrillation: Secondary | ICD-10-CM

## 2015-03-20 DIAGNOSIS — Z5181 Encounter for therapeutic drug level monitoring: Secondary | ICD-10-CM

## 2015-03-20 DIAGNOSIS — I10 Essential (primary) hypertension: Secondary | ICD-10-CM | POA: Diagnosis not present

## 2015-03-20 LAB — CBC WITH DIFFERENTIAL/PLATELET
Basophils Absolute: 0 10*3/uL (ref 0.0–0.1)
Basophils Relative: 0.5 % (ref 0.0–3.0)
Eosinophils Absolute: 0.1 10*3/uL (ref 0.0–0.7)
Eosinophils Relative: 1.9 % (ref 0.0–5.0)
HEMATOCRIT: 37.7 % — AB (ref 39.0–52.0)
HEMOGLOBIN: 12.7 g/dL — AB (ref 13.0–17.0)
Lymphocytes Relative: 18.4 % (ref 12.0–46.0)
Lymphs Abs: 1 10*3/uL (ref 0.7–4.0)
MCHC: 33.8 g/dL (ref 30.0–36.0)
MCV: 86.3 fl (ref 78.0–100.0)
Monocytes Absolute: 0.4 10*3/uL (ref 0.1–1.0)
Monocytes Relative: 7 % (ref 3.0–12.0)
NEUTROS ABS: 3.9 10*3/uL (ref 1.4–7.7)
Neutrophils Relative %: 72.2 % (ref 43.0–77.0)
Platelets: 221 10*3/uL (ref 150.0–400.0)
RBC: 4.36 Mil/uL (ref 4.22–5.81)
RDW: 15.5 % (ref 11.5–15.5)
WBC: 5.4 10*3/uL (ref 4.0–10.5)

## 2015-03-20 LAB — POCT INR: INR: 3

## 2015-03-20 NOTE — Patient Instructions (Signed)
Medication Instructions:  Your physician recommends that you continue on your current medications as directed. Please refer to the Current Medication list given to you today.  Labwork: Your physician recommends that you have lab work today: CBC  Testing/Procedures: No new orders.  Follow-Up: Your physician wants you to follow-up in: 6 MONTHS with Dr Aundra Dubin. You will receive a reminder letter in the mail two months in advance. If you don't receive a letter, please call our office to schedule the follow-up appointment.   Any Other Special Instructions Will Be Listed Below (If Applicable).
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
## 2015-04-03 DIAGNOSIS — R972 Elevated prostate specific antigen [PSA]: Secondary | ICD-10-CM | POA: Diagnosis not present

## 2015-04-03 DIAGNOSIS — N400 Enlarged prostate without lower urinary tract symptoms: Secondary | ICD-10-CM | POA: Diagnosis not present

## 2015-04-03 DIAGNOSIS — N471 Phimosis: Secondary | ICD-10-CM | POA: Diagnosis not present

## 2015-04-27 ENCOUNTER — Other Ambulatory Visit: Payer: Self-pay | Admitting: Cardiology

## 2015-04-27 ENCOUNTER — Ambulatory Visit (INDEPENDENT_AMBULATORY_CARE_PROVIDER_SITE_OTHER): Payer: Medicare Other | Admitting: *Deleted

## 2015-04-27 DIAGNOSIS — Z7901 Long term (current) use of anticoagulants: Secondary | ICD-10-CM | POA: Diagnosis not present

## 2015-04-27 DIAGNOSIS — I4891 Unspecified atrial fibrillation: Secondary | ICD-10-CM

## 2015-04-27 DIAGNOSIS — Z5181 Encounter for therapeutic drug level monitoring: Secondary | ICD-10-CM

## 2015-04-27 LAB — POCT INR: INR: 3.5

## 2015-05-17 DIAGNOSIS — S80862A Insect bite (nonvenomous), left lower leg, initial encounter: Secondary | ICD-10-CM | POA: Diagnosis not present

## 2015-05-17 DIAGNOSIS — W57XXXA Bitten or stung by nonvenomous insect and other nonvenomous arthropods, initial encounter: Secondary | ICD-10-CM | POA: Diagnosis not present

## 2015-05-25 ENCOUNTER — Ambulatory Visit (INDEPENDENT_AMBULATORY_CARE_PROVIDER_SITE_OTHER): Payer: Medicare Other | Admitting: Surgery

## 2015-05-25 DIAGNOSIS — Z5181 Encounter for therapeutic drug level monitoring: Secondary | ICD-10-CM

## 2015-05-25 DIAGNOSIS — Z7901 Long term (current) use of anticoagulants: Secondary | ICD-10-CM | POA: Diagnosis not present

## 2015-05-25 DIAGNOSIS — I4891 Unspecified atrial fibrillation: Secondary | ICD-10-CM | POA: Diagnosis not present

## 2015-05-25 LAB — POCT INR: INR: 2.6

## 2015-06-16 ENCOUNTER — Encounter: Payer: Self-pay | Admitting: Gastroenterology

## 2015-06-29 ENCOUNTER — Ambulatory Visit (INDEPENDENT_AMBULATORY_CARE_PROVIDER_SITE_OTHER): Payer: Medicare Other | Admitting: *Deleted

## 2015-06-29 DIAGNOSIS — Z7901 Long term (current) use of anticoagulants: Secondary | ICD-10-CM | POA: Diagnosis not present

## 2015-06-29 DIAGNOSIS — Z5181 Encounter for therapeutic drug level monitoring: Secondary | ICD-10-CM

## 2015-06-29 DIAGNOSIS — I4891 Unspecified atrial fibrillation: Secondary | ICD-10-CM | POA: Diagnosis not present

## 2015-06-29 LAB — POCT INR: INR: 6.4

## 2015-06-29 LAB — PROTIME-INR
INR: 6.8 ratio — AB (ref 0.8–1.0)
PROTHROMBIN TIME: 71.9 s — AB (ref 9.6–13.1)

## 2015-06-30 ENCOUNTER — Ambulatory Visit (INDEPENDENT_AMBULATORY_CARE_PROVIDER_SITE_OTHER): Payer: Medicare Other

## 2015-06-30 ENCOUNTER — Ambulatory Visit (INDEPENDENT_AMBULATORY_CARE_PROVIDER_SITE_OTHER): Payer: Medicare Other | Admitting: Family Medicine

## 2015-06-30 VITALS — BP 122/80 | HR 76 | Temp 98.3°F | Resp 16 | Ht 73.5 in | Wt 189.0 lb

## 2015-06-30 DIAGNOSIS — J189 Pneumonia, unspecified organism: Secondary | ICD-10-CM | POA: Diagnosis not present

## 2015-06-30 DIAGNOSIS — R05 Cough: Secondary | ICD-10-CM

## 2015-06-30 DIAGNOSIS — R059 Cough, unspecified: Secondary | ICD-10-CM

## 2015-06-30 DIAGNOSIS — J181 Lobar pneumonia, unspecified organism: Secondary | ICD-10-CM

## 2015-06-30 DIAGNOSIS — R918 Other nonspecific abnormal finding of lung field: Secondary | ICD-10-CM

## 2015-06-30 LAB — POCT CBC
GRANULOCYTE PERCENT: 76.9 % (ref 37–80)
HCT, POC: 41.4 % — AB (ref 43.5–53.7)
Hemoglobin: 13.1 g/dL — AB (ref 14.1–18.1)
Lymph, poc: 1.3 (ref 0.6–3.4)
MCH, POC: 27 pg (ref 27–31.2)
MCHC: 31.5 g/dL — AB (ref 31.8–35.4)
MCV: 85.7 fL (ref 80–97)
MID (CBC): 0.4 (ref 0–0.9)
MPV: 7.4 fL (ref 0–99.8)
PLATELET COUNT, POC: 281 10*3/uL (ref 142–424)
POC Granulocyte: 5.6 (ref 2–6.9)
POC LYMPH %: 18 % (ref 10–50)
POC MID %: 5.1 %M (ref 0–12)
RBC: 4.84 M/uL (ref 4.69–6.13)
RDW, POC: 14.7 %
WBC: 7.3 10*3/uL (ref 4.6–10.2)

## 2015-06-30 LAB — POCT SEDIMENTATION RATE: POCT SED RATE: 36 mm/hr — AB (ref 0–22)

## 2015-06-30 MED ORDER — BENZONATATE 100 MG PO CAPS: 100.0000 mg | ORAL_CAPSULE | Freq: Three times a day (TID) | ORAL | Status: DC | PRN

## 2015-06-30 MED ORDER — HYDROCODONE-HOMATROPINE 5-1.5 MG/5ML PO SYRP: 5.0000 mL | ORAL_SOLUTION | ORAL | Status: DC | PRN

## 2015-06-30 MED ORDER — AMOXICILLIN-POT CLAVULANATE 875-125 MG PO TABS: 1.0000 | ORAL_TABLET | Freq: Two times a day (BID) | ORAL | Status: DC
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
## 2015-06-30 NOTE — Patient Instructions (Signed)
Take the Augmentin 1 twice daily with breakfast and supper  Take the cough syrup if needed for nighttime cough 1 teaspoon every 4-6 hours. It will cause drowsiness  Take the benzonatate cough pills one or 2 pills 3 times daily for the dry hacking cough  Drink plenty of fluids and get enough rest  Plan to return in about 10 days for a follow-up chest x-ray or see your primary care doctor for that.  Return at anytime if worse

## 2015-07-03 ENCOUNTER — Ambulatory Visit (INDEPENDENT_AMBULATORY_CARE_PROVIDER_SITE_OTHER): Payer: Medicare Other | Admitting: *Deleted

## 2015-07-03 DIAGNOSIS — Z5181 Encounter for therapeutic drug level monitoring: Secondary | ICD-10-CM

## 2015-07-03 DIAGNOSIS — Z7901 Long term (current) use of anticoagulants: Secondary | ICD-10-CM | POA: Diagnosis not present

## 2015-07-03 DIAGNOSIS — I4891 Unspecified atrial fibrillation: Secondary | ICD-10-CM | POA: Diagnosis not present

## 2015-07-03 LAB — POCT INR: INR: 1.9

## 2015-07-10 ENCOUNTER — Ambulatory Visit (INDEPENDENT_AMBULATORY_CARE_PROVIDER_SITE_OTHER): Payer: Medicare Other | Admitting: Family Medicine

## 2015-07-10 ENCOUNTER — Ambulatory Visit (INDEPENDENT_AMBULATORY_CARE_PROVIDER_SITE_OTHER): Payer: Medicare Other

## 2015-07-10 VITALS — BP 120/68 | HR 98 | Temp 98.4°F | Resp 14 | Ht 74.0 in | Wt 192.8 lb

## 2015-07-10 DIAGNOSIS — J189 Pneumonia, unspecified organism: Secondary | ICD-10-CM

## 2015-07-10 DIAGNOSIS — J181 Lobar pneumonia, unspecified organism: Principal | ICD-10-CM

## 2015-07-10 NOTE — Patient Instructions (Signed)
Return as needed.    Regular activity

## 2015-07-10 NOTE — Progress Notes (Signed)
followup pneumonia   Subjective:  Patient ID: Russell Dillon, male    DOB: 1946/07/06  Age: 69 y.o. MRN: [id number]  Patient is feeling well.  Rarely coughs    Objective:   CTA.  Heart irreg. Irreg.  [facility] reading (PRIMARY) by  Dr. Linna Darner Pneumonia resolved.     Assessment & Plan:   Assessment:  Pneumonia resolved A.Fib  Plan:  No new rx  There are no Patient Instructions on file for this visit.   Pio Eatherly, MD 07/10/2015

## 2015-07-11 ENCOUNTER — Ambulatory Visit (INDEPENDENT_AMBULATORY_CARE_PROVIDER_SITE_OTHER): Payer: Medicare Other | Admitting: *Deleted

## 2015-07-11 DIAGNOSIS — Z5181 Encounter for therapeutic drug level monitoring: Secondary | ICD-10-CM

## 2015-07-11 DIAGNOSIS — I4891 Unspecified atrial fibrillation: Secondary | ICD-10-CM

## 2015-07-11 DIAGNOSIS — Z7901 Long term (current) use of anticoagulants: Secondary | ICD-10-CM

## 2015-07-11 LAB — POCT INR: INR: 2.1

## 2015-07-28 DIAGNOSIS — Z23 Encounter for immunization: Secondary | ICD-10-CM | POA: Diagnosis not present

## 2015-08-01 ENCOUNTER — Ambulatory Visit (INDEPENDENT_AMBULATORY_CARE_PROVIDER_SITE_OTHER): Payer: Medicare Other

## 2015-08-01 DIAGNOSIS — Z7901 Long term (current) use of anticoagulants: Secondary | ICD-10-CM

## 2015-08-01 DIAGNOSIS — I4891 Unspecified atrial fibrillation: Secondary | ICD-10-CM

## 2015-08-01 DIAGNOSIS — Z5181 Encounter for therapeutic drug level monitoring: Secondary | ICD-10-CM | POA: Diagnosis not present

## 2015-08-01 LAB — POCT INR: INR: 2.9

## 2015-08-23 ENCOUNTER — Encounter: Payer: Self-pay | Admitting: Gastroenterology

## 2015-08-23 ENCOUNTER — Telehealth: Payer: Self-pay

## 2015-08-23 ENCOUNTER — Ambulatory Visit (INDEPENDENT_AMBULATORY_CARE_PROVIDER_SITE_OTHER): Payer: Medicare Other | Admitting: Gastroenterology

## 2015-08-23 VITALS — BP 118/70 | HR 82 | Ht 73.5 in | Wt 189.0 lb

## 2015-08-23 DIAGNOSIS — Z1211 Encounter for screening for malignant neoplasm of colon: Secondary | ICD-10-CM | POA: Diagnosis not present

## 2015-08-23 DIAGNOSIS — Z7901 Long term (current) use of anticoagulants: Secondary | ICD-10-CM

## 2015-08-23 DIAGNOSIS — I4891 Unspecified atrial fibrillation: Secondary | ICD-10-CM

## 2015-08-23 NOTE — Patient Instructions (Signed)
Please call back in December to schedule your Colonoscopy with extended bowel prep for February. You call our office [phone] and ask for [facility]. I will go ahead and send a letter to your doctor regarding your coumadin clearance.  Thank you for choosing me and [facility].  Pricilla Riffle. Dagoberto Ligas., MD., Marval Regal  cc: Marton Redwood, MD

## 2015-08-23 NOTE — Progress Notes (Signed)
    History of Present Illness: This is a 69 year old male here for CRC screening on Coumadin for afib. He was hospitalized for a small bowel obstruction in 2014. Prior colonoscopy 5 years ago had only a fair prep and he was recommended to have a five-year screening exam with a more extensive bowel prep. Currently has no gastrointestinal complaints. He eats a diet that is high in fiber. Denies weight loss, abdominal pain, constipation, diarrhea, change in stool caliber, melena, hematochezia, nausea, vomiting, dysphagia, reflux symptoms, chest pain.  Review of Systems: Pertinent positive and negative review of systems were noted in the above HPI section. All other review of systems were otherwise negative.  Current Medications, Allergies, Past Medical History, Past Surgical History, Family History and Social History were reviewed in [employer] record.  Physical Exam: General: Well developed, well nourished, no acute distress Head: Normocephalic and atraumatic Eyes:  sclerae anicteric, EOMI Ears: Normal auditory acuity Mouth: No deformity or lesions Neck: Supple, no masses or thyromegaly Lungs: Clear throughout to auscultation Heart: Regular rate and rhythm; no murmurs, rubs or bruits Abdomen: Soft, non tender and non distended. No masses, hepatosplenomegaly or hernias noted. Normal Bowel sounds Rectal: Deferred to colonoscopy Musculoskeletal: Symmetrical with no gross deformities  Skin: No lesions on visible extremities Pulses:  Normal pulses noted Extremities: No clubbing, cyanosis, edema or deformities noted Neurological: Alert oriented x 4, grossly nonfocal Cervical Nodes:  No significant cervical adenopathy Inguinal Nodes: No significant inguinal adenopathy Psychological:  Alert and cooperative. Normal mood and affect  Assessment and Recommendations:  1. CRC screening, average risk. Extended bowel prep needed given fair prep at last colonoscopy. He wants to  schedule colonoscopy during the upcoming winter.The risks (including bleeding, perforation, infection, missed lesions, medication reactions and possible hospitalization or surgery if complications occur), benefits, and alternatives to colonoscopy with possible biopsy and possible polypectomy were discussed with the patient and they consent to proceed.   2. Afib on Coumadin. Hold Coumadin 5 days before procedure - will instruct when and how to resume after procedure. Low but real risk of cardiovascular event such as heart attack, stroke, embolism, thrombosis or ischemia/infarct of other organs off Coumadin explained and need to seek urgent help if this occurs. The patient consents to proceed. Will communicate by phone or EMR with patient's prescribing provider to confirm that holding Coumadin is reasonable in this case.

## 2015-08-23 NOTE — Telephone Encounter (Signed)
  08/23/2015   RE: Russell Dillon DOB: [birth year] MRN: [id number]   Dear Dr. Brigitte Pulse and Dr. Marigene Ehlers,    We have scheduled the above patient for an endoscopic procedure. Our records show that he is on anticoagulation therapy.   Please advise as to how long the patient may come off his therapy of coumadin prior to the procedure, which is to be determined.  Please fax back/ or route the completed form to Marlon Pel, [place] at [phone].   Sincerely,    Marlon Pel, CMA Lucio Edward, MD

## 2015-08-28 ENCOUNTER — Ambulatory Visit (INDEPENDENT_AMBULATORY_CARE_PROVIDER_SITE_OTHER): Payer: Medicare Other | Admitting: *Deleted

## 2015-08-28 DIAGNOSIS — Z5181 Encounter for therapeutic drug level monitoring: Secondary | ICD-10-CM

## 2015-08-28 DIAGNOSIS — I4891 Unspecified atrial fibrillation: Secondary | ICD-10-CM

## 2015-08-28 DIAGNOSIS — Z7901 Long term (current) use of anticoagulants: Secondary | ICD-10-CM | POA: Diagnosis not present

## 2015-08-28 LAB — POCT INR: INR: 3.1

## 2015-08-31 NOTE — Telephone Encounter (Signed)
Left a message for patient to return my call. 

## 2015-08-31 NOTE — Telephone Encounter (Signed)
May hold coumadin 3-5 days prior to procedure as preferred by the GI MD.

## 2015-09-01 NOTE — Telephone Encounter (Signed)
Informed patient per Dr. Aundra Dubin patient can hold coumadin 5 days prior to his procedure when it is scheduled for February. Informed patient that we makes his Colonoscopy appt in February that he will also be set up for a nurse visit and this will be on his instructions. Pt verbalized understanding and will call back to schedule in December.

## 2015-09-18 ENCOUNTER — Ambulatory Visit (INDEPENDENT_AMBULATORY_CARE_PROVIDER_SITE_OTHER): Payer: Medicare Other | Admitting: Pharmacist

## 2015-09-18 DIAGNOSIS — I4891 Unspecified atrial fibrillation: Secondary | ICD-10-CM | POA: Diagnosis not present

## 2015-09-18 DIAGNOSIS — Z5181 Encounter for therapeutic drug level monitoring: Secondary | ICD-10-CM

## 2015-09-18 DIAGNOSIS — Z7901 Long term (current) use of anticoagulants: Secondary | ICD-10-CM | POA: Diagnosis not present

## 2015-09-18 LAB — POCT INR: INR: 3.9

## 2015-09-24 ENCOUNTER — Other Ambulatory Visit: Payer: Self-pay | Admitting: Cardiology

## 2015-10-02 ENCOUNTER — Ambulatory Visit (INDEPENDENT_AMBULATORY_CARE_PROVIDER_SITE_OTHER): Payer: Medicare Other | Admitting: *Deleted

## 2015-10-02 DIAGNOSIS — I4891 Unspecified atrial fibrillation: Secondary | ICD-10-CM

## 2015-10-02 DIAGNOSIS — Z7901 Long term (current) use of anticoagulants: Secondary | ICD-10-CM

## 2015-10-02 DIAGNOSIS — Z5181 Encounter for therapeutic drug level monitoring: Secondary | ICD-10-CM | POA: Diagnosis not present

## 2015-10-02 LAB — POCT INR: INR: 2.6

## 2015-10-25 ENCOUNTER — Other Ambulatory Visit: Payer: Self-pay | Admitting: *Deleted

## 2015-10-25 MED ORDER — WARFARIN SODIUM 5 MG PO TABS: ORAL_TABLET | ORAL | Status: DC

## 2015-10-30 ENCOUNTER — Ambulatory Visit (INDEPENDENT_AMBULATORY_CARE_PROVIDER_SITE_OTHER): Payer: Medicare Other

## 2015-10-30 DIAGNOSIS — Z5181 Encounter for therapeutic drug level monitoring: Secondary | ICD-10-CM

## 2015-10-30 DIAGNOSIS — I4891 Unspecified atrial fibrillation: Secondary | ICD-10-CM | POA: Diagnosis not present

## 2015-10-30 DIAGNOSIS — Z7901 Long term (current) use of anticoagulants: Secondary | ICD-10-CM

## 2015-10-30 LAB — POCT INR: INR: 4.2

## 2015-11-07 DIAGNOSIS — R8299 Other abnormal findings in urine: Secondary | ICD-10-CM | POA: Diagnosis not present

## 2015-11-07 DIAGNOSIS — E7800 Pure hypercholesterolemia, unspecified: Secondary | ICD-10-CM | POA: Diagnosis not present

## 2015-11-07 DIAGNOSIS — I10 Essential (primary) hypertension: Secondary | ICD-10-CM | POA: Diagnosis not present

## 2015-11-07 DIAGNOSIS — R7301 Impaired fasting glucose: Secondary | ICD-10-CM | POA: Diagnosis not present

## 2015-11-07 DIAGNOSIS — Z125 Encounter for screening for malignant neoplasm of prostate: Secondary | ICD-10-CM | POA: Diagnosis not present

## 2015-11-10 DIAGNOSIS — Z0000 Encounter for general adult medical examination without abnormal findings: Secondary | ICD-10-CM | POA: Diagnosis not present

## 2015-11-10 DIAGNOSIS — R972 Elevated prostate specific antigen [PSA]: Secondary | ICD-10-CM | POA: Diagnosis not present

## 2015-11-10 DIAGNOSIS — I480 Paroxysmal atrial fibrillation: Secondary | ICD-10-CM | POA: Diagnosis not present

## 2015-11-10 DIAGNOSIS — R7301 Impaired fasting glucose: Secondary | ICD-10-CM | POA: Diagnosis not present

## 2015-11-10 DIAGNOSIS — Z1389 Encounter for screening for other disorder: Secondary | ICD-10-CM | POA: Diagnosis not present

## 2015-11-10 DIAGNOSIS — N400 Enlarged prostate without lower urinary tract symptoms: Secondary | ICD-10-CM | POA: Diagnosis not present

## 2015-11-10 DIAGNOSIS — Z8719 Personal history of other diseases of the digestive system: Secondary | ICD-10-CM | POA: Diagnosis not present

## 2015-11-10 DIAGNOSIS — E7800 Pure hypercholesterolemia, unspecified: Secondary | ICD-10-CM | POA: Diagnosis not present

## 2015-11-10 DIAGNOSIS — Z7901 Long term (current) use of anticoagulants: Secondary | ICD-10-CM | POA: Diagnosis not present

## 2015-11-10 DIAGNOSIS — Z6827 Body mass index (BMI) 27.0-27.9, adult: Secondary | ICD-10-CM | POA: Diagnosis not present

## 2015-11-10 DIAGNOSIS — I10 Essential (primary) hypertension: Secondary | ICD-10-CM | POA: Diagnosis not present

## 2015-11-16 ENCOUNTER — Ambulatory Visit (INDEPENDENT_AMBULATORY_CARE_PROVIDER_SITE_OTHER): Payer: Medicare Other | Admitting: *Deleted

## 2015-11-16 DIAGNOSIS — Z7901 Long term (current) use of anticoagulants: Secondary | ICD-10-CM

## 2015-11-16 DIAGNOSIS — I4891 Unspecified atrial fibrillation: Secondary | ICD-10-CM

## 2015-11-16 DIAGNOSIS — Z5181 Encounter for therapeutic drug level monitoring: Secondary | ICD-10-CM | POA: Diagnosis not present

## 2015-11-16 LAB — POCT INR: INR: 3.5

## 2015-11-21 ENCOUNTER — Encounter: Payer: Self-pay | Admitting: Cardiology

## 2015-12-07 ENCOUNTER — Ambulatory Visit (INDEPENDENT_AMBULATORY_CARE_PROVIDER_SITE_OTHER): Payer: Medicare Other | Admitting: *Deleted

## 2015-12-07 DIAGNOSIS — I4891 Unspecified atrial fibrillation: Secondary | ICD-10-CM

## 2015-12-07 DIAGNOSIS — Z7901 Long term (current) use of anticoagulants: Secondary | ICD-10-CM

## 2015-12-07 DIAGNOSIS — Z5181 Encounter for therapeutic drug level monitoring: Secondary | ICD-10-CM

## 2015-12-07 LAB — POCT INR: INR: 2.6

## 2015-12-14 DIAGNOSIS — L570 Actinic keratosis: Secondary | ICD-10-CM | POA: Diagnosis not present

## 2015-12-14 DIAGNOSIS — D692 Other nonthrombocytopenic purpura: Secondary | ICD-10-CM | POA: Diagnosis not present

## 2015-12-14 DIAGNOSIS — D1801 Hemangioma of skin and subcutaneous tissue: Secondary | ICD-10-CM | POA: Diagnosis not present

## 2015-12-28 ENCOUNTER — Encounter: Payer: Self-pay | Admitting: Physician Assistant

## 2015-12-28 ENCOUNTER — Ambulatory Visit (INDEPENDENT_AMBULATORY_CARE_PROVIDER_SITE_OTHER): Payer: Medicare Other | Admitting: *Deleted

## 2015-12-28 ENCOUNTER — Ambulatory Visit (INDEPENDENT_AMBULATORY_CARE_PROVIDER_SITE_OTHER): Payer: Medicare Other | Admitting: Physician Assistant

## 2015-12-28 VITALS — BP 112/72 | HR 87 | Ht 73.5 in | Wt 190.1 lb

## 2015-12-28 DIAGNOSIS — Z5181 Encounter for therapeutic drug level monitoring: Secondary | ICD-10-CM

## 2015-12-28 DIAGNOSIS — Z7901 Long term (current) use of anticoagulants: Secondary | ICD-10-CM

## 2015-12-28 DIAGNOSIS — I10 Essential (primary) hypertension: Secondary | ICD-10-CM

## 2015-12-28 DIAGNOSIS — I4891 Unspecified atrial fibrillation: Secondary | ICD-10-CM | POA: Diagnosis not present

## 2015-12-28 LAB — POCT INR: INR: 3.6

## 2015-12-28 NOTE — Progress Notes (Signed)
Cardiology Office Note   Date:  12/28/2015   ID:  Russell Dillon, DOB Apr 04, 1946, MRN [id number]  PCP:  Marton Redwood, MD  Cardiologist:  Dr. Aundra Dubin   Follow up     History of Present Illness: Russell Dillon is a 70 y.o. male with a history of chronic atrial fibrillation on Coumadin, HTN, HLD and BPH who presents to clinic for cardiology follow-up.  Last seen by Dr. Aundra Dubin in 03/2015. Felt to be doing well from a cardiology standpoint.   Today he presents to clinic for follow up. He has been doing quite well. He continues to exercise. He walks a lot for work at the [employer] and does a lot of yard work as well. He stays very active. He does not salt anything or eat red meats. Eats lots of fruits. He lost his sister last week from liver/lung cancer. No CP or SOB. No LE edema, orthopnea, or PND. No dizziness or syncope. No blood in his stool or urine.     PMH: 1. Chronic atrial fibrillation since 2005. Echo (10/10) with EF 55-60%.  2. Stress myoview (11/08): EF 67%, no ischemia or infarction.  3. HTN 4. BPH 5. Hyperlipidemia 6. Partial SBO: Most recent episode in 8/14.   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 Active with [facility] [employer]. Daughter is professor at [employer].   FH: Mother with angina  Past Medical History  Diagnosis Date  . Chronic atrial fibrillation ([place])   . Hypertension   . Chronic anticoagulation   . Hyperlipidemia   . BPH (benign prostatic hypertrophy)   . Hx SBO   . Diastolic dysfunction October 2010    Normal LV systolic function  . Small bowel obstruction ([place])   . Atrial fibrillation [facility])     Past Surgical History  Procedure Laterality Date  . Cardioversion  05/01/2004  . US echocardiography  09/05/2009    ef 55-60%  . Cardiovascular stress test  09/30/2007    EF 67%  No ischemia.   . Small intestine surgery    .  Appendectomy       Current Outpatient Prescriptions  Medication Sig Dispense Refill  . atorvastatin (LIPITOR) 10 MG tablet Take 10 mg by mouth at bedtime.    Marland Kitchen diltiazem (CARDIZEM CD) 360 MG 24 hr capsule TAKE 1 CAPSULE BY MOUTH EVERY DAY 90 capsule 1  . finasteride (PROSCAR) 5 MG tablet Take 5 mg by mouth every morning.    . lactose free nutrition (BOOST PLUS) LIQD Take 237 mLs by mouth 3 (three) times daily between meals.  0  . warfarin (COUMADIN) 5 MG tablet Take as directed by coumadin clinic 100 tablet 1   No current facility-administered medications for this visit.    Allergies:   Review of patient's allergies indicates no known allergies.    Social History:  The patient  reports that he has never smoked. He has never used smokeless tobacco. He reports that he does not drink alcohol or use illicit drugs.   Family History:  The patient's family history includes Diabetes in his father; Heart attack in his mother; Heart disease in his father; Hypertension in his mother.    ROS:  Please see the history of present illness.   Otherwise, review of systems are positive for none.   All other systems are reviewed and negative.    PHYSICAL EXAM: VS:  BP 112/72  mmHg  Pulse 87  Ht 6' 1.5" (1.867 m)  Wt 190 lb 1.9 oz (86.238 kg)  BMI 24.74 kg/m2 , BMI Body mass index is 24.74 kg/(m^2). GEN: Well nourished, well developed, in no acute distress HEENT: normal Neck: no JVD, carotid bruits, or masses Cardiac: irreg irreg; no murmurs, rubs, or gallops,no edema  Respiratory:  clear to auscultation bilaterally, normal work of breathing GI: soft, nontender, nondistended, + BS MS: no deformity or atrophy Skin: warm and dry, no rash Neuro:  Strength and sensation are intact Psych: euthymic mood, full affect   EKG:  EKG is ordered today. The ekg ordered today demonstrates afib with CVR HR 87   Recent Labs: 03/20/2015: Platelets 221.0 06/30/2015: Hemoglobin 13.1*    Lipid Panel      Wt Readings from Last 3 Encounters:  12/28/15 190 lb 1.9 oz (86.238 kg)  08/23/15 189 lb (85.73 kg)  07/10/15 192 lb 12.8 oz (87.454 kg)      Other studies Reviewed: Additional studies/ records that were reviewed today include: 2D ECHO , nuclear stress test Review of the above records demonstrates:   Echo (10/10) with EF 55-60%.   Stress myoview (11/08): EF 67%, no ischemia or infarction.    ASSESSMENT AND PLAN:  Russell Dillon is a 70 y.o. male with a history of chronic atrial fibrillation on Coumadin, HTN, HLD and BPH who presents to clinic for cardiology follow-up.  Atrial fibrillation: chronic since 2005. Good rate control, continue diltiazem CD. Last echo in 2010 with normal EF. He is on warfarin and has not had a lot of problems with it. INR was supratheraputic today. Managed by coumadin clinic  HLD: Dr. Brigitte Pulse follows his lipids. Continue atorvastatin 10mg  daily.   HTN: BP well controlled today on Cardizem CD 360mg  daily.   Current medicines are reviewed at length with the patient today.  The patient does not have concerns regarding medicines.  The following changes have been made:  no change  Labs/ tests ordered today include:  No orders of the defined types were placed in this encounter.     Disposition:   FU with Dr. Aundra Dubin in 1 year.  Renea Ee  12/28/2015 3:42 PM    [facility] [place] [place], [place], [place]  [address] Phone: [phone]; Fax: [phone]

## 2015-12-28 NOTE — Patient Instructions (Signed)
Medication Instructions:   Your physician recommends that you continue on your current medications as directed. Please refer to the Current Medication list given to you today.  If you need a refill on your cardiac medications before your next appointment, please call your pharmacy.  Labwork:.NONE ORDER TODAY   Testing/Procedures:  NONE ORDER TODAY    Follow-Up: Your physician wants you to follow-up in: [place] will receive a reminder letter in the mail two months in advance. If you don't receive a letter, please call our office to schedule the follow-up appointment.     Any Other Special Instructions Will Be Listed Below (If Applicable).

## 2016-01-18 ENCOUNTER — Ambulatory Visit (INDEPENDENT_AMBULATORY_CARE_PROVIDER_SITE_OTHER): Payer: Medicare Other | Admitting: *Deleted

## 2016-01-18 DIAGNOSIS — Z7901 Long term (current) use of anticoagulants: Secondary | ICD-10-CM | POA: Diagnosis not present

## 2016-01-18 DIAGNOSIS — Z5181 Encounter for therapeutic drug level monitoring: Secondary | ICD-10-CM

## 2016-01-18 DIAGNOSIS — I4891 Unspecified atrial fibrillation: Secondary | ICD-10-CM

## 2016-01-18 LAB — POCT INR: INR: 3.3

## 2016-01-26 ENCOUNTER — Ambulatory Visit (AMBULATORY_SURGERY_CENTER): Payer: Self-pay | Admitting: *Deleted

## 2016-01-26 VITALS — Ht 74.0 in | Wt 198.2 lb

## 2016-01-26 DIAGNOSIS — Z86010 Personal history of colonic polyps: Secondary | ICD-10-CM

## 2016-01-26 MED ORDER — NA SULFATE-K SULFATE-MG SULF 17.5-3.13-1.6 GM/177ML PO SOLN: ORAL | Status: DC

## 2016-01-26 NOTE — Progress Notes (Signed)
No allergies to eggs or soy. No problems with anesthesia.  Pt given Emmi instructions for colonoscopy  No oxygen use  No diet drug use  

## 2016-01-30 ENCOUNTER — Other Ambulatory Visit: Payer: Self-pay | Admitting: Cardiology

## 2016-01-31 ENCOUNTER — Ambulatory Visit (INDEPENDENT_AMBULATORY_CARE_PROVIDER_SITE_OTHER): Payer: Medicare Other | Admitting: *Deleted

## 2016-01-31 DIAGNOSIS — Z7901 Long term (current) use of anticoagulants: Secondary | ICD-10-CM | POA: Diagnosis not present

## 2016-01-31 DIAGNOSIS — I4891 Unspecified atrial fibrillation: Secondary | ICD-10-CM

## 2016-01-31 DIAGNOSIS — Z5181 Encounter for therapeutic drug level monitoring: Secondary | ICD-10-CM | POA: Diagnosis not present

## 2016-01-31 LAB — POCT INR: INR: 1.4

## 2016-01-31 NOTE — Patient Instructions (Addendum)
01/31/16-TONIGHT TAKE 1.5 TABLETS & DO NOT TAKE ANY MORE COUMADIN UNTIL AFTER YOU COLONOSCOPY ON 02/06/16  YOU WILL RESUME COUMADIN THE NIGHT AFTER COLONOSCOPY. ONCE YOU RESTART TAKE AN EXTRA 1/2 TABLET FOR TWO DAYS WITH REGULAR DOSE THEN RESUME NORMAL DOSE.  02/13/16-APPT DATE

## 2016-02-06 ENCOUNTER — Ambulatory Visit (AMBULATORY_SURGERY_CENTER): Payer: Medicare Other | Admitting: Gastroenterology

## 2016-02-06 ENCOUNTER — Encounter: Payer: Self-pay | Admitting: Gastroenterology

## 2016-02-06 VITALS — BP 103/66 | HR 79 | Temp 98.4°F | Resp 12 | Ht 73.0 in | Wt 190.0 lb

## 2016-02-06 DIAGNOSIS — D122 Benign neoplasm of ascending colon: Secondary | ICD-10-CM | POA: Diagnosis not present

## 2016-02-06 DIAGNOSIS — Z86010 Personal history of colonic polyps: Secondary | ICD-10-CM | POA: Diagnosis not present

## 2016-02-06 DIAGNOSIS — I10 Essential (primary) hypertension: Secondary | ICD-10-CM | POA: Diagnosis not present

## 2016-02-06 DIAGNOSIS — Z1211 Encounter for screening for malignant neoplasm of colon: Secondary | ICD-10-CM | POA: Diagnosis present

## 2016-02-06 DIAGNOSIS — I4891 Unspecified atrial fibrillation: Secondary | ICD-10-CM | POA: Diagnosis not present

## 2016-02-06 MED ORDER — SODIUM CHLORIDE 0.9 % IV SOLN: 500.0000 mL | INTRAVENOUS | Status: DC

## 2016-02-06 NOTE — Op Note (Signed)
[name] Procedure Date: 02/06/2016 1:15 PM MRN: [id number] Endoscopist: Ladene Artist , MD Age: 70 Referring MD:  Date of Birth: 07-Mar-1946 Gender: Male Procedure:                Colonoscopy Indications:              Screening for colorectal malignant neoplasm Medicines:                Monitored Anesthesia Care Procedure:                Pre-Anesthesia Assessment:                           - Prior to the procedure, a History and Physical                            was performed, and patient medications and                            allergies were reviewed. The patient's tolerance of                            previous anesthesia was also reviewed. The risks                            and benefits of the procedure and the sedation                            options and risks were discussed with the patient.                            All questions were answered, and informed consent                            was obtained. Prior Anticoagulants: The patient has                            taken Coumadin (warfarin), last dose was 5 days                            prior to procedure. ASA Grade Assessment: III - A                            patient with severe systemic disease. After                            reviewing the risks and benefits, the patient was                            deemed in satisfactory condition to undergo the                            procedure.  After obtaining informed consent, the colonoscope                            was passed under direct vision. Throughout the                            procedure, the patient's blood pressure, pulse, and                            oxygen saturations were monitored continuously. The                            Model PCF-H190L [id number]) scope was introduced                            through the anus and advanced to the the cecum,       identified by appendiceal orifice and ileocecal                            valve. The colonoscopy was performed without                            difficulty. The patient tolerated the procedure                            well. The quality of the bowel preparation was                            adequate. The ileocecal valve, appendiceal orifice,                            and rectum were photographed. Scope In: 1:35:09 PM Scope Out: 1:50:36 PM Scope Withdrawal Time: 0 hours 10 minutes 14 seconds  Total Procedure Duration: 0 hours 15 minutes 27 seconds  Findings:      The digital rectal exam was normal.      A 6 mm polyp was found in the ascending colon. The polyp was sessile.       The polyp was removed with a cold snare. Resection and retrieval were       complete.      Internal hemorrhoids were found. The hemorrhoids were small and Grade I       (internal hemorrhoids that do not prolapse).      The exam was otherwise normal throughout the examined colon.      The retroflexed view of the distal rectum and anal verge was otherwise.      There was a medium-sized lipoma, 20 mm in diameter, in the descending       colon. Complications:            No immediate complications. Estimated Blood Loss:     Estimated blood loss was minimal. Impression:               - One 6 mm polyp in the ascending colon, removed                            with  a cold snare. Resected and retrieved.                           - Lipoma, 20 mm, in descending colon                           - Internal hemorrhoids. Recommendation:           - Patient has a contact number available for                            emergencies. The signs and symptoms of potential                            delayed complications were discussed with the                            patient. Return to normal activities tomorrow.                            Written discharge instructions were provided to the                             patient.                           - Resume previous diet.                           - Continue present medications.                           - Resume Coumadin (warfarin) at prior dose                            tomorrow. Refer to Coumadin Clinic for further                            adjustment of therapy.                           - Await pathology results.                           - Repeat colonoscopy is recommended in 5 years if                            polyp precancerous otherwise 10 years. The                            colonoscopy date will be determined after pathology                            results from today's exam become available for  review. Procedure Code(s):        --- Professional ---                           [phone], Colonoscopy, flexible; with removal of                            tumor(s), polyp(s), or other lesion(s) by snare                            technique CPT copyright 2016 American Medical Association. All rights reserved. Ladene Artist, MD 02/06/2016 1:59:39 PM This report has been signed electronically. Number of Addenda: 0 Referring MD:      Marton Redwood, MD

## 2016-02-06 NOTE — Progress Notes (Signed)
Called to room to assist during endoscopic procedure.  Patient ID and intended procedure confirmed with present staff. Received instructions for my participation in the procedure from the performing physician.  

## 2016-02-06 NOTE — Progress Notes (Signed)
To PACU  Awake and alert, Report to RN 

## 2016-02-06 NOTE — Patient Instructions (Signed)
YOU HAD AN ENDOSCOPIC PROCEDURE TODAY AT [facility]:   Refer to the procedure report that was given to you for any specific questions about what was found during the examination.  If the procedure report does not answer your questions, please call your gastroenterologist to clarify.  If you requested that your care partner not be given the details of your procedure findings, then the procedure report has been included in a sealed envelope for you to review at your convenience later.  YOU SHOULD EXPECT: Some feelings of bloating in the abdomen. Passage of more gas than usual.  Walking can help get rid of the air that was put into your GI tract during the procedure and reduce the bloating. If you had a lower endoscopy (such as a colonoscopy or flexible sigmoidoscopy) you may notice spotting of blood in your stool or on the toilet paper. If you underwent a bowel prep for your procedure, you may not have a normal bowel movement for a few days.  Please Note:  You might notice some irritation and congestion in your nose or some drainage.  This is from the oxygen used during your procedure.  There is no need for concern and it should clear up in a day or so.  SYMPTOMS TO REPORT IMMEDIATELY:   Following lower endoscopy (colonoscopy or flexible sigmoidoscopy):  Excessive amounts of blood in the stool  Significant tenderness or worsening of abdominal pains  Swelling of the abdomen that is new, acute  Fever of 100F or higher  For urgent or emergent issues, a gastroenterologist can be reached at any hour by calling [phone].   DIET: Your first meal following the procedure should be a small meal and then it is ok to progress to your normal diet. Heavy or fried foods are harder to digest and may make you feel nauseous or bloated.  Likewise, meals heavy in dairy and vegetables can increase bloating.  Drink plenty of fluids but you should avoid alcoholic beverages for 24  hours.  ACTIVITY:  You should plan to take it easy for the rest of today and you should NOT DRIVE or use heavy machinery until tomorrow (because of the sedation medicines used during the test).    FOLLOW UP: Our staff will call the number listed on your records the next business day following your procedure to check on you and address any questions or concerns that you may have regarding the information given to you following your procedure. If we do not reach you, we will leave a message.  However, if you are feeling well and you are not experiencing any problems, there is no need to return our call.  We will assume that you have returned to your regular daily activities without incident.  If any biopsies were taken you will be contacted by phone or by letter within the next 1-3 weeks.  Please call us at [phone] if you have not heard about the biopsies in 3 weeks.    SIGNATURES/CONFIDENTIALITY: You and/or your care partner have signed paperwork which will be entered into your electronic medical record.  These signatures attest to the fact that that the information above on your After Visit Summary has been reviewed and is understood.  Full responsibility of the confidentiality of this discharge information lies with you and/or your care-partner.  Resume Coumadin tomorrow. Next colonoscopy determined by pathology results.  Please review polyp and hemorrhoid handouts provided.

## 2016-02-07 ENCOUNTER — Other Ambulatory Visit: Payer: Self-pay | Admitting: *Deleted

## 2016-02-07 ENCOUNTER — Telehealth: Payer: Self-pay

## 2016-02-07 MED ORDER — DILTIAZEM HCL ER COATED BEADS 360 MG PO CP24: ORAL_CAPSULE | ORAL | Status: DC

## 2016-02-07 NOTE — Telephone Encounter (Signed)
  Follow up Call-  Call back number 02/06/2016  Post procedure Call Back phone  # [phone] hm  Permission to leave phone message Yes     Patient questions:  Do you have a fever, pain , or abdominal swelling? No. Pain Score  0 *  Have you tolerated food without any problems? Yes.    Have you been able to return to your normal activities? Yes.    Do you have any questions about your discharge instructions: Diet   No. Medications  No. Follow up visit  No.  Do you have questions or concerns about your Care? No.  Actions: * If pain score is 4 or above: No action needed, pain <4.

## 2016-02-13 ENCOUNTER — Ambulatory Visit (INDEPENDENT_AMBULATORY_CARE_PROVIDER_SITE_OTHER): Payer: Medicare Other | Admitting: *Deleted

## 2016-02-13 DIAGNOSIS — Z7901 Long term (current) use of anticoagulants: Secondary | ICD-10-CM | POA: Diagnosis not present

## 2016-02-13 DIAGNOSIS — I4891 Unspecified atrial fibrillation: Secondary | ICD-10-CM | POA: Diagnosis not present

## 2016-02-13 DIAGNOSIS — Z5181 Encounter for therapeutic drug level monitoring: Secondary | ICD-10-CM

## 2016-02-13 LAB — POCT INR: INR: 1.6

## 2016-02-15 ENCOUNTER — Encounter: Payer: Self-pay | Admitting: Gastroenterology

## 2016-02-16 ENCOUNTER — Encounter: Payer: Self-pay | Admitting: Gastroenterology

## 2016-02-23 ENCOUNTER — Ambulatory Visit (INDEPENDENT_AMBULATORY_CARE_PROVIDER_SITE_OTHER): Payer: Medicare Other

## 2016-02-23 DIAGNOSIS — Z5181 Encounter for therapeutic drug level monitoring: Secondary | ICD-10-CM | POA: Diagnosis not present

## 2016-02-23 DIAGNOSIS — I4891 Unspecified atrial fibrillation: Secondary | ICD-10-CM

## 2016-02-23 DIAGNOSIS — Z7901 Long term (current) use of anticoagulants: Secondary | ICD-10-CM

## 2016-02-23 LAB — POCT INR: INR: 2.5

## 2016-03-08 ENCOUNTER — Ambulatory Visit (INDEPENDENT_AMBULATORY_CARE_PROVIDER_SITE_OTHER): Payer: Medicare Other | Admitting: *Deleted

## 2016-03-08 DIAGNOSIS — Z7901 Long term (current) use of anticoagulants: Secondary | ICD-10-CM

## 2016-03-08 DIAGNOSIS — I4891 Unspecified atrial fibrillation: Secondary | ICD-10-CM

## 2016-03-08 DIAGNOSIS — Z5181 Encounter for therapeutic drug level monitoring: Secondary | ICD-10-CM | POA: Diagnosis not present

## 2016-03-08 LAB — POCT INR: INR: 2.6

## 2016-03-29 ENCOUNTER — Ambulatory Visit (INDEPENDENT_AMBULATORY_CARE_PROVIDER_SITE_OTHER): Payer: Medicare Other | Admitting: *Deleted

## 2016-03-29 DIAGNOSIS — I4891 Unspecified atrial fibrillation: Secondary | ICD-10-CM

## 2016-03-29 DIAGNOSIS — Z7901 Long term (current) use of anticoagulants: Secondary | ICD-10-CM | POA: Diagnosis not present

## 2016-03-29 DIAGNOSIS — Z5181 Encounter for therapeutic drug level monitoring: Secondary | ICD-10-CM | POA: Diagnosis not present

## 2016-03-29 LAB — POCT INR: INR: 2.7

## 2016-04-23 ENCOUNTER — Ambulatory Visit (INDEPENDENT_AMBULATORY_CARE_PROVIDER_SITE_OTHER): Payer: Medicare Other | Admitting: Pharmacist

## 2016-04-23 DIAGNOSIS — Z7901 Long term (current) use of anticoagulants: Secondary | ICD-10-CM | POA: Diagnosis not present

## 2016-04-23 DIAGNOSIS — Z5181 Encounter for therapeutic drug level monitoring: Secondary | ICD-10-CM | POA: Diagnosis not present

## 2016-04-23 DIAGNOSIS — I4891 Unspecified atrial fibrillation: Secondary | ICD-10-CM | POA: Diagnosis not present

## 2016-04-23 LAB — POCT INR: INR: 4.1

## 2016-04-25 ENCOUNTER — Other Ambulatory Visit: Payer: Self-pay | Admitting: Cardiology

## 2016-05-20 ENCOUNTER — Ambulatory Visit (INDEPENDENT_AMBULATORY_CARE_PROVIDER_SITE_OTHER): Payer: Medicare Other | Admitting: Pharmacist

## 2016-05-20 DIAGNOSIS — I4891 Unspecified atrial fibrillation: Secondary | ICD-10-CM

## 2016-05-20 DIAGNOSIS — Z5181 Encounter for therapeutic drug level monitoring: Secondary | ICD-10-CM | POA: Diagnosis not present

## 2016-05-20 LAB — POCT INR: INR: 2.3

## 2016-06-13 ENCOUNTER — Ambulatory Visit (INDEPENDENT_AMBULATORY_CARE_PROVIDER_SITE_OTHER): Payer: Medicare Other | Admitting: *Deleted

## 2016-06-13 DIAGNOSIS — Z5181 Encounter for therapeutic drug level monitoring: Secondary | ICD-10-CM | POA: Diagnosis not present

## 2016-06-13 DIAGNOSIS — I4891 Unspecified atrial fibrillation: Secondary | ICD-10-CM

## 2016-06-13 LAB — POCT INR: INR: 3.2

## 2016-07-11 ENCOUNTER — Ambulatory Visit (INDEPENDENT_AMBULATORY_CARE_PROVIDER_SITE_OTHER): Payer: Medicare Other | Admitting: *Deleted

## 2016-07-11 DIAGNOSIS — Z5181 Encounter for therapeutic drug level monitoring: Secondary | ICD-10-CM | POA: Diagnosis not present

## 2016-07-11 DIAGNOSIS — I4891 Unspecified atrial fibrillation: Secondary | ICD-10-CM

## 2016-07-11 LAB — POCT INR: INR: 4.6

## 2016-07-18 DIAGNOSIS — Z23 Encounter for immunization: Secondary | ICD-10-CM | POA: Diagnosis not present

## 2016-07-25 ENCOUNTER — Ambulatory Visit (INDEPENDENT_AMBULATORY_CARE_PROVIDER_SITE_OTHER): Payer: Medicare Other | Admitting: Pharmacist

## 2016-07-25 DIAGNOSIS — I4891 Unspecified atrial fibrillation: Secondary | ICD-10-CM

## 2016-07-25 DIAGNOSIS — Z5181 Encounter for therapeutic drug level monitoring: Secondary | ICD-10-CM | POA: Diagnosis not present

## 2016-07-25 LAB — POCT INR: INR: 3.3

## 2016-08-08 ENCOUNTER — Ambulatory Visit (INDEPENDENT_AMBULATORY_CARE_PROVIDER_SITE_OTHER): Payer: Medicare Other | Admitting: *Deleted

## 2016-08-08 DIAGNOSIS — I4891 Unspecified atrial fibrillation: Secondary | ICD-10-CM | POA: Diagnosis not present

## 2016-08-08 DIAGNOSIS — Z5181 Encounter for therapeutic drug level monitoring: Secondary | ICD-10-CM | POA: Diagnosis not present

## 2016-08-08 LAB — POCT INR: INR: 2.2

## 2016-08-29 ENCOUNTER — Ambulatory Visit (INDEPENDENT_AMBULATORY_CARE_PROVIDER_SITE_OTHER): Payer: Medicare Other | Admitting: Pharmacist

## 2016-08-29 DIAGNOSIS — I4891 Unspecified atrial fibrillation: Secondary | ICD-10-CM

## 2016-08-29 DIAGNOSIS — Z5181 Encounter for therapeutic drug level monitoring: Secondary | ICD-10-CM

## 2016-08-29 LAB — POCT INR: INR: 2.7

## 2016-09-26 ENCOUNTER — Ambulatory Visit (INDEPENDENT_AMBULATORY_CARE_PROVIDER_SITE_OTHER): Payer: Medicare Other

## 2016-09-26 DIAGNOSIS — I4891 Unspecified atrial fibrillation: Secondary | ICD-10-CM | POA: Diagnosis not present

## 2016-09-26 DIAGNOSIS — Z5181 Encounter for therapeutic drug level monitoring: Secondary | ICD-10-CM | POA: Diagnosis not present

## 2016-09-26 LAB — POCT INR: INR: 2.8

## 2016-10-18 ENCOUNTER — Other Ambulatory Visit: Payer: Self-pay

## 2016-10-31 ENCOUNTER — Other Ambulatory Visit: Payer: Self-pay | Admitting: Cardiology

## 2016-11-07 ENCOUNTER — Ambulatory Visit (INDEPENDENT_AMBULATORY_CARE_PROVIDER_SITE_OTHER): Payer: Medicare Other | Admitting: *Deleted

## 2016-11-07 DIAGNOSIS — I4891 Unspecified atrial fibrillation: Secondary | ICD-10-CM | POA: Diagnosis not present

## 2016-11-07 DIAGNOSIS — Z5181 Encounter for therapeutic drug level monitoring: Secondary | ICD-10-CM

## 2016-11-07 LAB — POCT INR: INR: 1.8

## 2016-11-25 DIAGNOSIS — R8299 Other abnormal findings in urine: Secondary | ICD-10-CM | POA: Diagnosis not present

## 2016-11-25 DIAGNOSIS — E7800 Pure hypercholesterolemia, unspecified: Secondary | ICD-10-CM | POA: Diagnosis not present

## 2016-11-25 DIAGNOSIS — R7301 Impaired fasting glucose: Secondary | ICD-10-CM | POA: Diagnosis not present

## 2016-11-25 DIAGNOSIS — I10 Essential (primary) hypertension: Secondary | ICD-10-CM | POA: Diagnosis not present

## 2016-11-25 DIAGNOSIS — Z125 Encounter for screening for malignant neoplasm of prostate: Secondary | ICD-10-CM | POA: Diagnosis not present

## 2016-12-02 DIAGNOSIS — R972 Elevated prostate specific antigen [PSA]: Secondary | ICD-10-CM | POA: Diagnosis not present

## 2016-12-02 DIAGNOSIS — I10 Essential (primary) hypertension: Secondary | ICD-10-CM | POA: Diagnosis not present

## 2016-12-02 DIAGNOSIS — E784 Other hyperlipidemia: Secondary | ICD-10-CM | POA: Diagnosis not present

## 2016-12-02 DIAGNOSIS — Z7901 Long term (current) use of anticoagulants: Secondary | ICD-10-CM | POA: Diagnosis not present

## 2016-12-02 DIAGNOSIS — Z1389 Encounter for screening for other disorder: Secondary | ICD-10-CM | POA: Diagnosis not present

## 2016-12-02 DIAGNOSIS — I480 Paroxysmal atrial fibrillation: Secondary | ICD-10-CM | POA: Diagnosis not present

## 2016-12-02 DIAGNOSIS — Z6826 Body mass index (BMI) 26.0-26.9, adult: Secondary | ICD-10-CM | POA: Diagnosis not present

## 2016-12-02 DIAGNOSIS — N400 Enlarged prostate without lower urinary tract symptoms: Secondary | ICD-10-CM | POA: Diagnosis not present

## 2016-12-02 DIAGNOSIS — R748 Abnormal levels of other serum enzymes: Secondary | ICD-10-CM | POA: Diagnosis not present

## 2016-12-02 DIAGNOSIS — Z8719 Personal history of other diseases of the digestive system: Secondary | ICD-10-CM | POA: Diagnosis not present

## 2016-12-02 DIAGNOSIS — R7301 Impaired fasting glucose: Secondary | ICD-10-CM | POA: Diagnosis not present

## 2016-12-02 DIAGNOSIS — Z0000 Encounter for general adult medical examination without abnormal findings: Secondary | ICD-10-CM | POA: Diagnosis not present

## 2016-12-05 ENCOUNTER — Ambulatory Visit (INDEPENDENT_AMBULATORY_CARE_PROVIDER_SITE_OTHER): Payer: Medicare Other | Admitting: Pharmacist

## 2016-12-05 DIAGNOSIS — I4891 Unspecified atrial fibrillation: Secondary | ICD-10-CM | POA: Diagnosis not present

## 2016-12-05 DIAGNOSIS — Z5181 Encounter for therapeutic drug level monitoring: Secondary | ICD-10-CM | POA: Diagnosis not present

## 2016-12-05 LAB — POCT INR: INR: 2

## 2016-12-16 DIAGNOSIS — R748 Abnormal levels of other serum enzymes: Secondary | ICD-10-CM | POA: Diagnosis not present

## 2016-12-20 DIAGNOSIS — R351 Nocturia: Secondary | ICD-10-CM | POA: Diagnosis not present

## 2016-12-20 DIAGNOSIS — R972 Elevated prostate specific antigen [PSA]: Secondary | ICD-10-CM | POA: Diagnosis not present

## 2016-12-20 DIAGNOSIS — N401 Enlarged prostate with lower urinary tract symptoms: Secondary | ICD-10-CM | POA: Diagnosis not present

## 2017-01-01 DIAGNOSIS — D1801 Hemangioma of skin and subcutaneous tissue: Secondary | ICD-10-CM | POA: Diagnosis not present

## 2017-01-01 DIAGNOSIS — D2372 Other benign neoplasm of skin of left lower limb, including hip: Secondary | ICD-10-CM | POA: Diagnosis not present

## 2017-01-01 DIAGNOSIS — L570 Actinic keratosis: Secondary | ICD-10-CM | POA: Diagnosis not present

## 2017-01-02 ENCOUNTER — Ambulatory Visit (INDEPENDENT_AMBULATORY_CARE_PROVIDER_SITE_OTHER): Payer: Medicare Other | Admitting: *Deleted

## 2017-01-02 DIAGNOSIS — Z5181 Encounter for therapeutic drug level monitoring: Secondary | ICD-10-CM

## 2017-01-02 DIAGNOSIS — I4891 Unspecified atrial fibrillation: Secondary | ICD-10-CM | POA: Diagnosis not present

## 2017-01-02 LAB — POCT INR: INR: 2

## 2017-01-30 ENCOUNTER — Ambulatory Visit (INDEPENDENT_AMBULATORY_CARE_PROVIDER_SITE_OTHER): Payer: Medicare Other | Admitting: *Deleted

## 2017-01-30 DIAGNOSIS — I4891 Unspecified atrial fibrillation: Secondary | ICD-10-CM

## 2017-01-30 DIAGNOSIS — Z5181 Encounter for therapeutic drug level monitoring: Secondary | ICD-10-CM | POA: Diagnosis not present

## 2017-01-30 LAB — POCT INR: INR: 1.9

## 2017-02-17 ENCOUNTER — Other Ambulatory Visit: Payer: Self-pay | Admitting: *Deleted

## 2017-02-17 MED ORDER — DILTIAZEM HCL ER COATED BEADS 360 MG PO CP24: 360.0000 mg | ORAL_CAPSULE | Freq: Every day | ORAL | 0 refills | Status: DC

## 2017-02-17 MED ORDER — WARFARIN SODIUM 5 MG PO TABS: ORAL_TABLET | ORAL | 0 refills | Status: DC

## 2017-02-28 ENCOUNTER — Ambulatory Visit (INDEPENDENT_AMBULATORY_CARE_PROVIDER_SITE_OTHER): Payer: Medicare Other | Admitting: *Deleted

## 2017-02-28 DIAGNOSIS — Z5181 Encounter for therapeutic drug level monitoring: Secondary | ICD-10-CM

## 2017-02-28 DIAGNOSIS — I4891 Unspecified atrial fibrillation: Secondary | ICD-10-CM

## 2017-02-28 LAB — POCT INR: INR: 1.7

## 2017-03-14 ENCOUNTER — Ambulatory Visit (INDEPENDENT_AMBULATORY_CARE_PROVIDER_SITE_OTHER): Payer: Medicare Other | Admitting: *Deleted

## 2017-03-14 DIAGNOSIS — Z5181 Encounter for therapeutic drug level monitoring: Secondary | ICD-10-CM

## 2017-03-14 DIAGNOSIS — I4891 Unspecified atrial fibrillation: Secondary | ICD-10-CM | POA: Diagnosis not present

## 2017-03-14 LAB — POCT INR: INR: 1.9

## 2017-03-28 ENCOUNTER — Ambulatory Visit (INDEPENDENT_AMBULATORY_CARE_PROVIDER_SITE_OTHER): Payer: Medicare Other | Admitting: *Deleted

## 2017-03-28 DIAGNOSIS — Z5181 Encounter for therapeutic drug level monitoring: Secondary | ICD-10-CM

## 2017-03-28 DIAGNOSIS — I4891 Unspecified atrial fibrillation: Secondary | ICD-10-CM | POA: Diagnosis not present

## 2017-03-28 LAB — POCT INR: INR: 2.3

## 2017-04-18 ENCOUNTER — Ambulatory Visit (INDEPENDENT_AMBULATORY_CARE_PROVIDER_SITE_OTHER): Payer: Medicare Other | Admitting: *Deleted

## 2017-04-18 DIAGNOSIS — I4891 Unspecified atrial fibrillation: Secondary | ICD-10-CM | POA: Diagnosis not present

## 2017-04-18 DIAGNOSIS — Z5181 Encounter for therapeutic drug level monitoring: Secondary | ICD-10-CM | POA: Diagnosis not present

## 2017-04-18 LAB — POCT INR: INR: 2.2

## 2017-05-06 ENCOUNTER — Ambulatory Visit (INDEPENDENT_AMBULATORY_CARE_PROVIDER_SITE_OTHER): Payer: Medicare Other | Admitting: *Deleted

## 2017-05-06 DIAGNOSIS — Z5181 Encounter for therapeutic drug level monitoring: Secondary | ICD-10-CM | POA: Diagnosis not present

## 2017-05-06 DIAGNOSIS — I4891 Unspecified atrial fibrillation: Secondary | ICD-10-CM

## 2017-05-06 LAB — POCT INR: INR: 2.2

## 2017-05-13 ENCOUNTER — Other Ambulatory Visit (HOSPITAL_COMMUNITY): Payer: Self-pay | Admitting: *Deleted

## 2017-06-03 ENCOUNTER — Ambulatory Visit (INDEPENDENT_AMBULATORY_CARE_PROVIDER_SITE_OTHER): Payer: Medicare Other

## 2017-06-03 ENCOUNTER — Encounter (INDEPENDENT_AMBULATORY_CARE_PROVIDER_SITE_OTHER): Payer: Self-pay

## 2017-06-03 DIAGNOSIS — Z5181 Encounter for therapeutic drug level monitoring: Secondary | ICD-10-CM

## 2017-06-03 DIAGNOSIS — I4891 Unspecified atrial fibrillation: Secondary | ICD-10-CM

## 2017-06-03 LAB — POCT INR: INR: 2.6

## 2017-06-09 ENCOUNTER — Other Ambulatory Visit: Payer: Self-pay | Admitting: Cardiology

## 2017-06-09 MED ORDER — DILTIAZEM HCL ER COATED BEADS 360 MG PO CP24: 360.0000 mg | ORAL_CAPSULE | Freq: Every day | ORAL | 0 refills | Status: DC

## 2017-06-09 NOTE — Telephone Encounter (Signed)
Pt's medication was sent to pt's pharmacy as requested. Confirmation received.  °

## 2017-06-11 ENCOUNTER — Telehealth: Payer: Self-pay | Admitting: Cardiology

## 2017-06-11 ENCOUNTER — Telehealth: Payer: Self-pay

## 2017-06-11 MED ORDER — DILTIAZEM HCL ER COATED BEADS 360 MG PO CP24: 360.0000 mg | ORAL_CAPSULE | Freq: Every day | ORAL | 0 refills | Status: DC

## 2017-06-11 NOTE — Telephone Encounter (Signed)
patient wife called inquiring about a medication refill. I explained to her that we do not have a DPR signed for our office to discuss anything or move forward unless he comes in and fills out one listing his wife Russell Dillon on the form.

## 2017-06-11 NOTE — Telephone Encounter (Signed)
Pt's medication was sent to pt's pharmacy as requested. Confirmation received.  °

## 2017-06-11 NOTE — Telephone Encounter (Signed)
NEW MESSAGE   *STAT* If patient is at the pharmacy, call can be transferred to refill team.   1. Which medications need to be refilled? (please list name of each medication and dose if known)  DILTIAZEM 360MG   2. Which pharmacy/location (including street and city if local pharmacy) is medication to be sent to? [facility]  3. Do they need a 30 day or 90 day supply?  90DAY

## 2017-06-11 NOTE — Addendum Note (Signed)
Addended by: Derl Barrow on: 06/11/2017 02:47 PM   Modules accepted: Orders

## 2017-07-01 ENCOUNTER — Ambulatory Visit (INDEPENDENT_AMBULATORY_CARE_PROVIDER_SITE_OTHER): Payer: Medicare Other | Admitting: *Deleted

## 2017-07-01 DIAGNOSIS — Z5181 Encounter for therapeutic drug level monitoring: Secondary | ICD-10-CM

## 2017-07-01 DIAGNOSIS — I4891 Unspecified atrial fibrillation: Secondary | ICD-10-CM

## 2017-07-01 LAB — POCT INR: INR: 2.6

## 2017-07-01 MED ORDER — WARFARIN SODIUM 5 MG PO TABS: ORAL_TABLET | ORAL | 1 refills | Status: DC

## 2017-07-02 ENCOUNTER — Ambulatory Visit: Payer: Medicare Other | Admitting: Cardiology

## 2017-07-10 ENCOUNTER — Ambulatory Visit: Payer: Medicare Other | Admitting: Cardiology

## 2017-07-16 ENCOUNTER — Encounter: Payer: Self-pay | Admitting: Physician Assistant

## 2017-07-16 ENCOUNTER — Telehealth: Payer: Self-pay | Admitting: *Deleted

## 2017-07-16 ENCOUNTER — Ambulatory Visit (INDEPENDENT_AMBULATORY_CARE_PROVIDER_SITE_OTHER): Payer: Medicare Other | Admitting: Physician Assistant

## 2017-07-16 VITALS — BP 128/70 | HR 73 | Ht 74.0 in | Wt 195.8 lb

## 2017-07-16 DIAGNOSIS — R600 Localized edema: Secondary | ICD-10-CM | POA: Diagnosis not present

## 2017-07-16 DIAGNOSIS — I482 Chronic atrial fibrillation, unspecified: Secondary | ICD-10-CM

## 2017-07-16 DIAGNOSIS — E785 Hyperlipidemia, unspecified: Secondary | ICD-10-CM | POA: Diagnosis not present

## 2017-07-16 DIAGNOSIS — I10 Essential (primary) hypertension: Secondary | ICD-10-CM | POA: Diagnosis not present

## 2017-07-16 DIAGNOSIS — Z7901 Long term (current) use of anticoagulants: Secondary | ICD-10-CM | POA: Diagnosis not present

## 2017-07-16 MED ORDER — DILTIAZEM HCL ER COATED BEADS 360 MG PO CP24: 360.0000 mg | ORAL_CAPSULE | Freq: Every day | ORAL | 0 refills | Status: DC

## 2017-07-16 NOTE — Telephone Encounter (Signed)
-----   Message from Charlie Pitter, [place] sent at 07/16/2017  2:31 PM EDT ----- Regarding: 2d echo I was looking further through chart and can't actually find the echo report he had in 2010. It's been several years since he had one so since he's had minimal edema recently and is on diltiazem, let's get a repeat echo to make sure LV function is stable (and to help [place] decide if he would be a candidate for Eliquis as this is off the table if he has valvular disease). Dayna Dunn PA-C

## 2017-07-16 NOTE — Addendum Note (Signed)
Addended by: Gaetano Net on: 07/16/2017 02:36 PM   Modules accepted: Orders

## 2017-07-16 NOTE — Telephone Encounter (Signed)
Called pt per Melina Copa, PA-C, re: ordering a Echocardiogram. Left a message for pt to call back.

## 2017-07-16 NOTE — Patient Instructions (Signed)
Medication Instructions:  Your physician recommends that you continue on your current medications as directed. Please refer to the Current Medication list given to you today.   Labwork: TODAY:  BMET & CBC  Testing/Procedures: None ordered  Follow-Up: Your physician wants you to follow-up in: [place] will receive a reminder letter in the mail two months in advance. If you don't receive a letter, please call our office to schedule the follow-up appointment.   Any Other Special Instructions Will Be Listed Below (If Applicable).  Call us if you decide you would like to switch to Eliquis.   If you need a refill on your cardiac medications before your next appointment, please call your pharmacy.

## 2017-07-16 NOTE — Progress Notes (Signed)
Cardiology Office Note    Date:  07/16/2017  ID:  Russell Dillon, DOB December 23, 1945, MRN [id number] PCP:  Marton Redwood, MD  Cardiologist:  Previously Dr. Aundra Dubin   Chief Complaint: f/u atrial fib  History of Present Illness:  Russell Dillon is a 71 y.o. male with history of chronic atrial fib on Coumadin, HTN, HLD, BPH, prior partial SBO who presents for overdue follow-up. He has had chronic atrial fib since 2005. Stress nuclear test in 2008 was negative for ischemia or infarction, EF 67%. Per Dr. Claris Gladden note, 2D echo 10/10 showed EF 55-60% but study unavailable in chart for review. Diastolic dysfunction is listed in PMH. INR is followed by our clinic. Last labs via [place] report in 11/2016 showed HDL 63, LDL 87, A1C 5.4, Hgb 13.4, Cr 0.8, K 4.2, TSH 2.4.  He returns for follow-up today feeling great. He continues to work for the [employer] (54 years thus far). He denies any chest pain, palpitations, dyspnea, syncope, dizziness. He's not had any issues with Coumadin, except [place]. He has chronic mild trace edema which he states is present more often when he's been on his feet throughout the day.   Past Medical History:  Diagnosis Date  . BPH (benign prostatic hypertrophy)   . Chronic anticoagulation   . Chronic atrial fibrillation ([place])   . Diastolic dysfunction October 2010   Normal LV systolic function  . Hx SBO   . Hyperlipidemia   . Hypertension   . Small bowel obstruction [facility])     Past Surgical History:  Procedure Laterality Date  . APPENDECTOMY  2004  . CARDIOVASCULAR STRESS TEST  09/30/2007   EF 67%  No ischemia.   Marland Kitchen CARDIOVERSION  05/01/2004  . SMALL INTESTINE SURGERY  2004  . US ECHOCARDIOGRAPHY  09/05/2009   ef 55-60%    Current Medications: Current Meds  Medication Sig  . atorvastatin (LIPITOR) 10 MG tablet Take 10 mg by mouth at bedtime.  Marland Kitchen diltiazem (CARDIZEM CD) 360 MG 24 hr  capsule Take 1 capsule (360 mg total) by mouth daily.  . finasteride (PROSCAR) 5 MG tablet Take 5 mg by mouth every morning.  . lactose free nutrition (BOOST PLUS) LIQD Take 237 mLs by mouth 3 (three) times daily between meals.  . warfarin (COUMADIN) 5 MG tablet TAKE AS DIRECTED BY  COUMADIN CLINIC     Allergies:   Patient has no known allergies.   Social History   Social History  . Marital status: Married    Spouse name: N/A  . Number of children: 1  . Years of education: N/A   Occupational History  . water supply safety coordinator    Social History Main Topics  . Smoking status: Never Smoker  . Smokeless tobacco: Never Used  . Alcohol use No  . Drug use: No  . Sexual activity: Yes   Other Topics Concern  . None   Social History Narrative  . None     Family History:  Family History  Problem Relation Age of Onset  . Heart attack Mother   . Hypertension Mother   . Diabetes Father   . Heart disease Father   . Colon cancer Neg Hx   . Esophageal cancer Neg Hx   . Pancreatic cancer Neg Hx   . Prostate cancer Neg Hx   . Rectal cancer Neg Hx   .  Stomach cancer Neg Hx     ROS:   Please see the history of present illness.  All other systems are reviewed and otherwise negative.    PHYSICAL EXAM:   VS:  BP 128/70   Pulse 73   Ht 6\' 2"  (1.88 m)   Wt 195 lb 12.8 oz (88.8 kg)   BMI 25.14 kg/m   BMI: Body mass index is 25.14 kg/m. GEN: Well nourished, well developed WM, in no acute distress  HEENT: normocephalic, atraumatic Neck: no JVD, carotid bruits, or masses Cardiac: irregularly irregular, rate controlled; no murmurs, rubs, or gallops, trace edema edema Respiratory:  clear to auscultation bilaterally, normal work of breathing GI: soft, nontender, nondistended, + BS MS: no deformity or atrophy  Skin: warm and dry, no rash Neuro:  Alert and Oriented x 3, Strength and sensation are intact, follows commands Psych: euthymic mood, full affect  Wt Readings from  Last 3 Encounters:  07/16/17 195 lb 12.8 oz (88.8 kg)  02/06/16 190 lb (86.2 kg)  01/26/16 198 lb 3.2 oz (89.9 kg)      Studies/Labs Reviewed:   EKG:  EKG was ordered today and personally reviewed by me and demonstrates atrial fibrillation 74bpm, no acute St-T changes   Recent Labs: No results found for requested labs within last 8760 hours.   Lipid Panel   Additional studies/ records that were reviewed today include: Summarized above    ASSESSMENT & PLAN:   1. Chronic atrial fib with long-term use of anticoagulant - he is doing well on present regimen. Rate controlled. He's not had any issues with Coumadin. He states NOACs were previously discussed with him by Dr. Aundra Dubin but at that time he saw no issues continuing Coumadin so elected not to change. He does report it was a hassle recently coming back to get his INR checked after being at the [facility]. I think he would be a good candidate for Eliquis so we discussed this change. He would like to review further with his wife. I would recommend updating echocardiogram to exclude any valvular disease although his exam is quite benign aside from irregular heartbeat. Will update CBC, BMET today. Reviewed bleeding risks. 2. Essential HTN - controlled on present regimen. 3. Hyperlipidemia - followed by primary care. Patient reports his yearly followup falls in early winter, between Jan-Feb. Recent LDL was 87. 4. Lower extremity edema - Has trace lower extremity edema but he reports this is longstanding/chronic and comes when he's been on his feet all day. This only appears present at the sockline itself, no substantial swelling or pitting. Diltiazem may also play a role. This does not seem to be particularly bothersome to him so will follow for now. Will update echocardiogram as we do not have prior access to study from 2010.  Disposition: Will set him up to establish care in 1 year with Dr. Saunders Revel since Dr. Aundra Dubin has transitioned to [facility].   Medication Adjustments/Labs and Tests Ordered: Current medicines are reviewed at length with the patient today.  Concerns regarding medicines are outlined above. Medication changes, Labs and Tests ordered today are summarized above and listed in the Patient Instructions accessible in Encounters.   Signed, Charlie Pitter, PA-C  07/16/2017 1:31 PM    [facility] [address], [place], [place]  [address] Phone: [phone]; Fax: [phone]

## 2017-07-17 LAB — BASIC METABOLIC PANEL
BUN/Creatinine Ratio: 13 (ref 10–24)
BUN: 13 mg/dL (ref 8–27)
CALCIUM: 9.1 mg/dL (ref 8.6–10.2)
CHLORIDE: 95 mmol/L — AB (ref 96–106)
CO2: 26 mmol/L (ref 20–29)
Creatinine, Ser: 0.99 mg/dL (ref 0.76–1.27)
GFR calc non Af Amer: 75 mL/min/{1.73_m2} (ref >59)
GFR, EST AFRICAN AMERICAN: 87 mL/min/{1.73_m2} (ref >59)
Glucose: 83 mg/dL (ref 65–99)
POTASSIUM: 5.1 mmol/L (ref 3.5–5.2)
Sodium: 135 mmol/L (ref 134–144)

## 2017-07-17 LAB — CBC
HEMOGLOBIN: 14 g/dL (ref 13.0–17.7)
Hematocrit: 41.5 % (ref 37.5–51.0)
MCH: 29.5 pg (ref 26.6–33.0)
MCHC: 33.7 g/dL (ref 31.5–35.7)
MCV: 87 fL (ref 79–97)
Platelets: 246 10*3/uL (ref 150–379)
RBC: 4.75 x10E6/uL (ref 4.14–5.80)
RDW: 15.1 % (ref 12.3–15.4)
WBC: 5.9 10*3/uL (ref 3.4–10.8)

## 2017-07-24 NOTE — Telephone Encounter (Signed)
Pt wfe, Russell Dillon, DPR on file, has been made aware that pt will need to have a 2 d echo since it has been several years since he has had 1 done. He is scheduled for 07/28/17 arriving 2:45. She verbalized understanding.

## 2017-07-28 ENCOUNTER — Other Ambulatory Visit: Payer: Self-pay | Admitting: Pharmacist Clinician (PhC)/ Clinical Pharmacy Specialist

## 2017-07-28 ENCOUNTER — Ambulatory Visit (HOSPITAL_COMMUNITY): Payer: Medicare Other | Attending: Physician Assistant

## 2017-07-28 ENCOUNTER — Other Ambulatory Visit: Payer: Self-pay

## 2017-07-28 DIAGNOSIS — I482 Chronic atrial fibrillation, unspecified: Secondary | ICD-10-CM

## 2017-07-28 DIAGNOSIS — I4891 Unspecified atrial fibrillation: Secondary | ICD-10-CM | POA: Diagnosis not present

## 2017-07-28 DIAGNOSIS — Z8249 Family history of ischemic heart disease and other diseases of the circulatory system: Secondary | ICD-10-CM | POA: Diagnosis not present

## 2017-07-28 DIAGNOSIS — E785 Hyperlipidemia, unspecified: Secondary | ICD-10-CM | POA: Diagnosis not present

## 2017-07-28 DIAGNOSIS — I080 Rheumatic disorders of both mitral and aortic valves: Secondary | ICD-10-CM | POA: Diagnosis not present

## 2017-07-28 DIAGNOSIS — I10 Essential (primary) hypertension: Secondary | ICD-10-CM | POA: Insufficient documentation

## 2017-07-28 MED ORDER — ATORVASTATIN CALCIUM 10 MG PO TABS: 10.0000 mg | ORAL_TABLET | Freq: Every day | ORAL | 3 refills | Status: DC

## 2017-08-14 DIAGNOSIS — Z23 Encounter for immunization: Secondary | ICD-10-CM | POA: Diagnosis not present

## 2017-08-28 ENCOUNTER — Ambulatory Visit (INDEPENDENT_AMBULATORY_CARE_PROVIDER_SITE_OTHER): Payer: Medicare Other | Admitting: Pharmacist

## 2017-08-28 DIAGNOSIS — Z5181 Encounter for therapeutic drug level monitoring: Secondary | ICD-10-CM

## 2017-08-28 DIAGNOSIS — I4891 Unspecified atrial fibrillation: Secondary | ICD-10-CM | POA: Diagnosis not present

## 2017-08-28 LAB — POCT INR: INR: 3.4

## 2017-09-12 ENCOUNTER — Other Ambulatory Visit: Payer: Self-pay | Admitting: *Deleted

## 2017-09-12 MED ORDER — DILTIAZEM HCL ER COATED BEADS 360 MG PO CP24: 360.0000 mg | ORAL_CAPSULE | Freq: Every day | ORAL | 2 refills | Status: DC

## 2017-09-12 NOTE — Telephone Encounter (Signed)
Received two faxes from [facility] in regards to patients diltiazem. One requesting refills, the patient was seen in the office on 07/16/17 by Melina Copa, PA and an rx was sent to [facility] for #90 with 0 refills. Not sure why the patient was not given any additional refills as he was given a follow up of one year. The other fax was requesting the supervising physician for [facility]. I have provided this information however given that they require this for rx's sent in by extenders, I will refill the patients rx under Dr Aundra Dubin as patient has been assigned to Dr End but he has not seen him yet for an office visit.

## 2017-09-25 ENCOUNTER — Ambulatory Visit (INDEPENDENT_AMBULATORY_CARE_PROVIDER_SITE_OTHER): Payer: Medicare Other | Admitting: *Deleted

## 2017-09-25 DIAGNOSIS — Z5181 Encounter for therapeutic drug level monitoring: Secondary | ICD-10-CM | POA: Diagnosis not present

## 2017-09-25 DIAGNOSIS — I4891 Unspecified atrial fibrillation: Secondary | ICD-10-CM

## 2017-09-25 LAB — POCT INR: INR: 2.3

## 2017-09-25 NOTE — Patient Instructions (Signed)
Continue taking Coumadin dose of 1 tablet everyday except 1/2 tablet on Sundays. Recheck INR in 4 weeks. [facility]in Clinic [phone]

## 2017-10-23 ENCOUNTER — Ambulatory Visit (INDEPENDENT_AMBULATORY_CARE_PROVIDER_SITE_OTHER): Payer: Medicare Other | Admitting: *Deleted

## 2017-10-23 DIAGNOSIS — I4891 Unspecified atrial fibrillation: Secondary | ICD-10-CM | POA: Diagnosis not present

## 2017-10-23 DIAGNOSIS — Z5181 Encounter for therapeutic drug level monitoring: Secondary | ICD-10-CM | POA: Diagnosis not present

## 2017-10-23 LAB — POCT INR: INR: 2.3

## 2017-10-23 NOTE — Patient Instructions (Signed)
Description   Continue taking Coumadin dose of 1 tablet everyday except 1/2 tablet on Sundays. Recheck INR in 5 weeks. Coumadin Clinic [phone]

## 2017-11-12 ENCOUNTER — Ambulatory Visit (INDEPENDENT_AMBULATORY_CARE_PROVIDER_SITE_OTHER): Payer: Medicare Other

## 2017-11-12 DIAGNOSIS — I4891 Unspecified atrial fibrillation: Secondary | ICD-10-CM

## 2017-11-12 DIAGNOSIS — Z5181 Encounter for therapeutic drug level monitoring: Secondary | ICD-10-CM

## 2017-11-12 LAB — POCT INR: INR: 2.5

## 2017-11-12 MED ORDER — WARFARIN SODIUM 5 MG PO TABS: ORAL_TABLET | ORAL | 1 refills | Status: DC

## 2017-11-12 NOTE — Patient Instructions (Signed)
Continue taking Coumadin dose of 1 tablet everyday except 1/2 tablet on Sundays. Recheck INR in 6 weeks. Coumadin Clinic [phone] 

## 2017-11-26 DIAGNOSIS — Z125 Encounter for screening for malignant neoplasm of prostate: Secondary | ICD-10-CM | POA: Diagnosis not present

## 2017-11-26 DIAGNOSIS — R7301 Impaired fasting glucose: Secondary | ICD-10-CM | POA: Diagnosis not present

## 2017-11-26 DIAGNOSIS — I10 Essential (primary) hypertension: Secondary | ICD-10-CM | POA: Diagnosis not present

## 2017-11-26 DIAGNOSIS — E7849 Other hyperlipidemia: Secondary | ICD-10-CM | POA: Diagnosis not present

## 2017-11-26 DIAGNOSIS — R82998 Other abnormal findings in urine: Secondary | ICD-10-CM | POA: Diagnosis not present

## 2017-12-03 DIAGNOSIS — I480 Paroxysmal atrial fibrillation: Secondary | ICD-10-CM | POA: Diagnosis not present

## 2017-12-03 DIAGNOSIS — N400 Enlarged prostate without lower urinary tract symptoms: Secondary | ICD-10-CM | POA: Diagnosis not present

## 2017-12-03 DIAGNOSIS — Z1389 Encounter for screening for other disorder: Secondary | ICD-10-CM | POA: Diagnosis not present

## 2017-12-03 DIAGNOSIS — I10 Essential (primary) hypertension: Secondary | ICD-10-CM | POA: Diagnosis not present

## 2017-12-03 DIAGNOSIS — Z8719 Personal history of other diseases of the digestive system: Secondary | ICD-10-CM | POA: Diagnosis not present

## 2017-12-03 DIAGNOSIS — Z7901 Long term (current) use of anticoagulants: Secondary | ICD-10-CM | POA: Diagnosis not present

## 2017-12-03 DIAGNOSIS — R7301 Impaired fasting glucose: Secondary | ICD-10-CM | POA: Diagnosis not present

## 2017-12-03 DIAGNOSIS — Z6827 Body mass index (BMI) 27.0-27.9, adult: Secondary | ICD-10-CM | POA: Diagnosis not present

## 2017-12-03 DIAGNOSIS — R972 Elevated prostate specific antigen [PSA]: Secondary | ICD-10-CM | POA: Diagnosis not present

## 2017-12-03 DIAGNOSIS — Z0000 Encounter for general adult medical examination without abnormal findings: Secondary | ICD-10-CM | POA: Diagnosis not present

## 2017-12-03 DIAGNOSIS — E7849 Other hyperlipidemia: Secondary | ICD-10-CM | POA: Diagnosis not present

## 2017-12-24 ENCOUNTER — Ambulatory Visit (INDEPENDENT_AMBULATORY_CARE_PROVIDER_SITE_OTHER): Payer: Medicare Other | Admitting: *Deleted

## 2017-12-24 DIAGNOSIS — I4891 Unspecified atrial fibrillation: Secondary | ICD-10-CM

## 2017-12-24 DIAGNOSIS — Z5181 Encounter for therapeutic drug level monitoring: Secondary | ICD-10-CM | POA: Diagnosis not present

## 2017-12-24 LAB — POCT INR: INR: 2.6

## 2017-12-24 NOTE — Patient Instructions (Signed)
Description   Continue taking Coumadin dose of 1 tablet everyday except 1/2 tablet on Sundays. Recheck INR in 6 weeks. Coumadin Clinic [phone]     

## 2018-01-06 DIAGNOSIS — L570 Actinic keratosis: Secondary | ICD-10-CM | POA: Diagnosis not present

## 2018-01-06 DIAGNOSIS — D2372 Other benign neoplasm of skin of left lower limb, including hip: Secondary | ICD-10-CM | POA: Diagnosis not present

## 2018-01-06 DIAGNOSIS — L821 Other seborrheic keratosis: Secondary | ICD-10-CM | POA: Diagnosis not present

## 2018-01-27 DIAGNOSIS — G3184 Mild cognitive impairment, so stated: Secondary | ICD-10-CM | POA: Diagnosis not present

## 2018-02-02 DIAGNOSIS — R351 Nocturia: Secondary | ICD-10-CM | POA: Diagnosis not present

## 2018-02-02 DIAGNOSIS — N401 Enlarged prostate with lower urinary tract symptoms: Secondary | ICD-10-CM | POA: Diagnosis not present

## 2018-02-02 DIAGNOSIS — R972 Elevated prostate specific antigen [PSA]: Secondary | ICD-10-CM | POA: Diagnosis not present

## 2018-02-04 ENCOUNTER — Ambulatory Visit (INDEPENDENT_AMBULATORY_CARE_PROVIDER_SITE_OTHER): Payer: Medicare Other | Admitting: *Deleted

## 2018-02-04 DIAGNOSIS — I4891 Unspecified atrial fibrillation: Secondary | ICD-10-CM | POA: Diagnosis not present

## 2018-02-04 DIAGNOSIS — Z5181 Encounter for therapeutic drug level monitoring: Secondary | ICD-10-CM

## 2018-02-04 LAB — POCT INR: INR: 2.8

## 2018-02-04 NOTE — Patient Instructions (Signed)
Description   Continue taking Coumadin dose of 1 tablet everyday except 1/2 tablet on Sundays. Recheck INR in 6 weeks. Coumadin Clinic [phone]     

## 2018-03-10 DIAGNOSIS — G3184 Mild cognitive impairment, so stated: Secondary | ICD-10-CM | POA: Diagnosis not present

## 2018-03-18 ENCOUNTER — Ambulatory Visit (INDEPENDENT_AMBULATORY_CARE_PROVIDER_SITE_OTHER): Payer: Medicare Other | Admitting: *Deleted

## 2018-03-18 DIAGNOSIS — I4891 Unspecified atrial fibrillation: Secondary | ICD-10-CM | POA: Diagnosis not present

## 2018-03-18 DIAGNOSIS — I482 Chronic atrial fibrillation, unspecified: Secondary | ICD-10-CM

## 2018-03-18 DIAGNOSIS — Z5181 Encounter for therapeutic drug level monitoring: Secondary | ICD-10-CM | POA: Diagnosis not present

## 2018-03-18 LAB — POCT INR: INR: 1.9

## 2018-03-18 NOTE — Patient Instructions (Signed)
Description   Today May 7th take 1 and 1/2 tablets then continue taking Coumadin dose of 1 tablet everyday except 1/2 tablet on Sundays. Recheck INR in 6 weeks. Coumadin Clinic [phone]

## 2018-04-29 ENCOUNTER — Ambulatory Visit (INDEPENDENT_AMBULATORY_CARE_PROVIDER_SITE_OTHER): Payer: Medicare Other | Admitting: *Deleted

## 2018-04-29 DIAGNOSIS — Z5181 Encounter for therapeutic drug level monitoring: Secondary | ICD-10-CM | POA: Diagnosis not present

## 2018-04-29 DIAGNOSIS — I4891 Unspecified atrial fibrillation: Secondary | ICD-10-CM

## 2018-04-29 LAB — POCT INR: INR: 2.1 (ref 2.0–3.0)

## 2018-04-29 NOTE — Patient Instructions (Signed)
Description   Continue taking Coumadin dose of 1 tablet everyday except 1/2 tablet on Sundays. Recheck INR in 6 weeks. Coumadin Clinic [phone]     

## 2018-06-16 ENCOUNTER — Encounter (INDEPENDENT_AMBULATORY_CARE_PROVIDER_SITE_OTHER): Payer: Self-pay

## 2018-06-16 ENCOUNTER — Ambulatory Visit (INDEPENDENT_AMBULATORY_CARE_PROVIDER_SITE_OTHER): Payer: Medicare Other | Admitting: *Deleted

## 2018-06-16 DIAGNOSIS — I4891 Unspecified atrial fibrillation: Secondary | ICD-10-CM

## 2018-06-16 DIAGNOSIS — Z5181 Encounter for therapeutic drug level monitoring: Secondary | ICD-10-CM | POA: Diagnosis not present

## 2018-06-16 LAB — POCT INR: INR: 2.1 (ref 2.0–3.0)

## 2018-06-16 NOTE — Patient Instructions (Signed)
Description   Continue taking Coumadin dose of 1 tablet everyday except 1/2 tablet on Sundays. Recheck INR in 6 weeks. Coumadin Clinic [phone]     

## 2018-07-21 DIAGNOSIS — G3184 Mild cognitive impairment, so stated: Secondary | ICD-10-CM | POA: Diagnosis not present

## 2018-07-25 DIAGNOSIS — Z23 Encounter for immunization: Secondary | ICD-10-CM | POA: Diagnosis not present

## 2018-07-28 ENCOUNTER — Ambulatory Visit (INDEPENDENT_AMBULATORY_CARE_PROVIDER_SITE_OTHER): Payer: Medicare Other | Admitting: *Deleted

## 2018-07-28 ENCOUNTER — Encounter (INDEPENDENT_AMBULATORY_CARE_PROVIDER_SITE_OTHER): Payer: Self-pay

## 2018-07-28 DIAGNOSIS — Z5181 Encounter for therapeutic drug level monitoring: Secondary | ICD-10-CM

## 2018-07-28 DIAGNOSIS — I4891 Unspecified atrial fibrillation: Secondary | ICD-10-CM | POA: Diagnosis not present

## 2018-07-28 LAB — POCT INR: INR: 2.3 (ref 2.0–3.0)

## 2018-07-28 NOTE — Patient Instructions (Signed)
Description   Continue taking Coumadin dose of 1 tablet everyday except 1/2 tablet on Sundays. Recheck INR in 6 weeks. Coumadin Clinic [phone]     

## 2018-08-24 ENCOUNTER — Other Ambulatory Visit: Payer: Self-pay | Admitting: Cardiology

## 2018-09-09 ENCOUNTER — Telehealth: Payer: Self-pay | Admitting: *Deleted

## 2018-09-09 ENCOUNTER — Other Ambulatory Visit: Payer: Self-pay | Admitting: Cardiology

## 2018-09-09 ENCOUNTER — Other Ambulatory Visit (HOSPITAL_COMMUNITY): Payer: Self-pay

## 2018-09-09 MED ORDER — WARFARIN SODIUM 5 MG PO TABS: ORAL_TABLET | ORAL | 0 refills | Status: DC

## 2018-09-09 MED ORDER — DILTIAZEM HCL ER COATED BEADS 360 MG PO CP24: 360.0000 mg | ORAL_CAPSULE | Freq: Every day | ORAL | 0 refills | Status: DC

## 2018-09-09 MED ORDER — ATORVASTATIN CALCIUM 10 MG PO TABS: 10.0000 mg | ORAL_TABLET | Freq: Every day | ORAL | 0 refills | Status: DC

## 2018-09-09 NOTE — Telephone Encounter (Signed)
Pt's medications were sent to pt's pharmacy as requested, enough until appt with Richardson Dopp, PA in December. Confirmation received.

## 2018-09-09 NOTE — Telephone Encounter (Addendum)
Received a voicemail from the wife stating the pt needs a refill on his Warfarin. Returned call and wife states she was unaware that the refill was not filled by [facility] until she received a letter int he mail stating she needed to call to get a refill. Wife states pt has been out of town. Notified that the pt missed appt on yesterday and she states she was unaware of appt that he missed on yesterday and that pt has had some memory issues. Advised that he will need an appt and I will send in a refill to requested pharmacy locally and mail order. Appt set for pt tomorrow at 3pm. She ws gracious for appt and assistance. Wife is aware 7 tabs sent to local pharmacy until mail order arrives and 100 tabs sent to mail order.

## 2018-09-09 NOTE — Telephone Encounter (Signed)
Pt wife called and stated that the Mail Order service will not be able to sent out meds until 09/26/18, therefore, she needs more sent locally. Will send in a 30 day supply at this time locally.

## 2018-09-09 NOTE — Addendum Note (Signed)
Addended by: Derrel Nip B on: 09/09/2018 09:34 AM   Modules accepted: Orders

## 2018-09-09 NOTE — Addendum Note (Signed)
Addended by: Derrel Nip B on: 09/09/2018 10:39 AM   Modules accepted: Orders

## 2018-09-10 ENCOUNTER — Ambulatory Visit (INDEPENDENT_AMBULATORY_CARE_PROVIDER_SITE_OTHER): Payer: Medicare Other | Admitting: *Deleted

## 2018-09-10 DIAGNOSIS — I4891 Unspecified atrial fibrillation: Secondary | ICD-10-CM | POA: Diagnosis not present

## 2018-09-10 DIAGNOSIS — Z5181 Encounter for therapeutic drug level monitoring: Secondary | ICD-10-CM

## 2018-09-10 LAB — POCT INR: INR: 2.3 (ref 2.0–3.0)

## 2018-09-10 NOTE — Patient Instructions (Signed)
Description   Continue taking Coumadin dose of 1 tablet everyday except 1/2 tablet on Sundays. Recheck INR in 6 weeks. Coumadin Clinic [phone]     

## 2018-09-14 MED ORDER — DILTIAZEM HCL ER COATED BEADS 360 MG PO CP24: 360.0000 mg | ORAL_CAPSULE | Freq: Every day | ORAL | 0 refills | Status: DC

## 2018-09-14 NOTE — Addendum Note (Signed)
Addended by: Derl Barrow on: 09/14/2018 08:29 AM   Modules accepted: Orders

## 2018-09-24 DIAGNOSIS — M21961 Unspecified acquired deformity of right lower leg: Secondary | ICD-10-CM | POA: Diagnosis not present

## 2018-09-24 DIAGNOSIS — M21962 Unspecified acquired deformity of left lower leg: Secondary | ICD-10-CM | POA: Diagnosis not present

## 2018-10-16 ENCOUNTER — Encounter: Payer: Self-pay | Admitting: Physician Assistant

## 2018-10-16 ENCOUNTER — Ambulatory Visit (INDEPENDENT_AMBULATORY_CARE_PROVIDER_SITE_OTHER): Payer: Medicare Other | Admitting: Physician Assistant

## 2018-10-16 VITALS — BP 134/70 | HR 83 | Ht 74.0 in | Wt 204.1 lb

## 2018-10-16 DIAGNOSIS — I10 Essential (primary) hypertension: Secondary | ICD-10-CM

## 2018-10-16 DIAGNOSIS — I351 Nonrheumatic aortic (valve) insufficiency: Secondary | ICD-10-CM

## 2018-10-16 DIAGNOSIS — I4821 Permanent atrial fibrillation: Secondary | ICD-10-CM

## 2018-10-16 DIAGNOSIS — E785 Hyperlipidemia, unspecified: Secondary | ICD-10-CM | POA: Diagnosis not present

## 2018-10-16 HISTORY — DX: Nonrheumatic aortic (valve) insufficiency: I35.1

## 2018-10-16 NOTE — Progress Notes (Signed)
Cardiology Office Note:    Date:  10/16/2018   ID:  [name], DOB May 26, 1946, MRN [id number]  PCP:  Marton Redwood, MD  Cardiologist:  Mertie Moores, MD / Richardson Dopp, PA-C  Electrophysiologist:  None   Referring MD: Marton Redwood, MD   Chief Complaint  Patient presents with  . Follow-up    AFib     History of Present Illness:    Russell Dillon is a 72 y.o. male with permanent atrial fibrillation, hypertension, hyperlipidemia.  He was previously followed by Dr. Aundra Dubin.  He was last seen in clinic by Melina Copa, PA-C in 07/2017.     [name] returns for follow up.  He is here with his wife.  He has been doing well.  He has not had any chest pain, shortness of breath, paroxysmal nocturnal dyspnea, leg swelling or syncope.  He has not had any bleeding issues.  His INRs are typically therapeutic.    Prior CV studies:   The following studies were reviewed today:  Echo 07/28/2017 EF 60-65, moderate AI, aortic root and ascending aorta mildly dilated (40 mm), ascending aorta 43 mm, MAC, mild MR, mild LAE, moderate RV enlargement, trivial TR   Past Medical History:  Diagnosis Date  . Aortic valve regurgitation 10/16/2018   Echo 07/28/2017:  EF 60-65, moderate AI, aortic root and ascending aorta mildly dilated (40 mm), ascending aorta 43 mm, MAC, mild MR, mild LAE, moderate RV enlargement, trivial TR  . BPH (benign prostatic hypertrophy)   . Chronic anticoagulation   . Chronic atrial fibrillation   . Diastolic dysfunction October 2010   Normal LV systolic function  . Hx SBO   . Hyperlipidemia   . Hypertension   . Small bowel obstruction [facility])    Surgical Hx: The patient  has a past surgical history that includes Cardioversion (05/01/2004); US ECHOCARDIOGRAPHY (09/05/2009); Cardiovascular stress test (09/30/2007); Small intestine surgery (2004); and Appendectomy (2004).   Current Medications: Current Meds  Medication Sig  . atorvastatin (LIPITOR) 10 MG tablet  Take 1 tablet (10 mg total) by mouth at bedtime. Please keep upcoming appt in December for future refills. Thank you  . diltiazem (CARDIZEM CD) 360 MG 24 hr capsule Take 1 capsule (360 mg total) by mouth daily. Please keep upcoming appt in December for future refills. Thank you  . finasteride (PROSCAR) 5 MG tablet Take 5 mg by mouth every morning.  . lactose free nutrition (BOOST PLUS) LIQD Take 237 mLs by mouth 3 (three) times daily between meals.  Marland Kitchen lithium carbonate 150 MG capsule Take 150 mg by mouth at bedtime.  . Multiple Vitamins-Minerals (ICAPS MV) TABS Take 1 tablet by mouth daily.  . Vitamin D, Cholecalciferol, 25 MCG (1000 UT) TABS Take 25 mcg by mouth 2 (two) times daily.  . Vitamins-Lipotropics (LIPOGEN SG PO) Take 100 mg by mouth daily.  Marland Kitchen warfarin (COUMADIN) 5 MG tablet TAKE AS DIRECTED BY COUMADIN CLINIC     Allergies:   Patient has no known allergies.   Social History   Tobacco Use  . Smoking status: Never Smoker  . Smokeless tobacco: Never Used  Substance Use Topics  . Alcohol use: No    Alcohol/week: 0.0 standard drinks  . Drug use: No     Family Hx: The patient's family history includes Diabetes in his father; Heart attack in his mother; Heart disease in his father; Hypertension in his mother. There is no history of Colon cancer, Esophageal cancer, Pancreatic cancer, Prostate cancer,  Rectal cancer, or Stomach cancer.  ROS:   Please see the history of present illness.    ROS All other systems reviewed and are negative.   EKGs/Labs/Other Test Reviewed:    EKG:  EKG is  ordered today.  The ekg ordered today demonstrates AFib, HR 83, normal axis, QTc 467, similar to old EKG  Recent Labs: No results found for requested labs within last 8760 hours.   Recent Lipid Panel Lab Results  Component Value Date/Time   CHOL  10/01/2008 06:00 AM    67        ATP III CLASSIFICATION:  <200     mg/dL   Desirable  200-239  mg/dL   Borderline High  >=240    mg/dL   High     TRIG 40 10/03/2008 09:09 AM   HDL 33 (L) 10/01/2008 06:00 AM   CHOLHDL 2.0 10/01/2008 06:00 AM   LDLCALC  10/01/2008 06:00 AM    24        Total Cholesterol/HDL:CHD Risk Coronary Heart Disease Risk Table                     Men   Women  1/2 Average Risk   3.4   3.3   From [employer] Cholesterol, total 191.000 m 11/26/2017 HDL 84 MG/DL 11/26/2017 LDL 100.000 m 11/26/2017 Triglycerides 35.000 11/26/2017 A1C 5.600 % 11/26/2017 Hemoglobin 14.100 g/ 11/26/2017 Creatinine, Serum 1.000 mg/ 11/26/2017 Potassium 5.100 07/16/2017 Magnesium N/D ALT (SGPT) 12.000 uni 11/26/2017 TSH 3.210 11/26/2017 BNP N/D INR 2.300 09/10/2018 Platelets 246.000 07/16/2017  Physical Exam:    VS:  BP 134/70   Pulse 83   Ht _0  (1.88 m)   Wt 204 lb 1.9 oz (92.6 kg)   SpO2 97%   BMI 26.21 kg/m     Wt Readings from Last 3 Encounters:  10/16/18 204 lb 1.9 oz (92.6 kg)  07/16/17 195 lb 12.8 oz (88.8 kg)  02/06/16 190 lb (86.2 kg)     Physical Exam  Constitutional: He is oriented to person, place, and time. He appears well-developed and well-nourished. No distress.  HENT:  Head: Normocephalic and atraumatic.  Eyes: No scleral icterus.  Neck: No JVD present. No thyromegaly present.  Cardiovascular: Normal rate and regular rhythm.  No murmur heard. Pulmonary/Chest: Effort normal. He has no rales.  Abdominal: Soft. He exhibits no distension.  Musculoskeletal: He exhibits no edema.  Lymphadenopathy:    He has no cervical adenopathy.  Neurological: He is alert and oriented to person, place, and time.  Skin: Skin is warm and dry.  Psychiatric: He has a normal mood and affect.    ASSESSMENT & PLAN:    Permanent atrial fibrillation  Rate controlled.  He is tolerating anticoagulation with Warfarin. Continue current management.  He was followed by Dr. Aundra Dubin and was supposed to transition to Dr. Saunders Revel.  But, with Dr. Darnelle Bos transition to [facility], I will have him follow up with me and Dr. Acie Fredrickson.   Essential  hypertension The patient's blood pressure is controlled on his current regimen.  Continue current therapy.    Hyperlipidemia, unspecified hyperlipidemia type  LDL optimal on most recent lab work.  Continue current Rx.    Aortic valve insufficiency, etiology of cardiac valve disease unspecified  Echo last year with mod AI and mildly dilated aortic root and ascending aorta.  I do no hear a murmur on exam.  I will have him repeat the echo to recheck the AI, aortic root  and ascending aorta.  -Obtain follow up echocardiogram   Dispo:  Return in about 1 year (around 10/17/2019) for Routine Follow Up, w/ Dr. Acie Fredrickson, or Richardson Dopp, PA-C.   Medication Adjustments/Labs and Tests Ordered: Current medicines are reviewed at length with the patient today.  Concerns regarding medicines are outlined above.  Tests Ordered: Orders Placed This Encounter  Procedures  . EKG 12-Lead  . ECHOCARDIOGRAM COMPLETE   Medication Changes: No orders of the defined types were placed in this encounter.   Signed, Richardson Dopp, PA-C  10/16/2018 9:50 AM    [facility] [address], [place], [place]  [address] Phone: [phone]; Fax: [phone]

## 2018-10-16 NOTE — Patient Instructions (Signed)
Medication Instructions:  Your physician recommends that you continue on your current medications as directed. Please refer to the Current Medication list given to you today.  If you need a refill on your cardiac medications before your next appointment, please call your pharmacy.   Lab work: NONE If you have labs (blood work) drawn today and your tests are completely normal, you will receive your results only by: Marland Kitchen MyChart Message (if you have MyChart) OR . A paper copy in the mail If you have any lab test that is abnormal or we need to change your treatment, we will call you to review the results.  Testing/Procedures: Your physician has requested that you have an echocardiogram. Echocardiography is a painless test that uses sound waves to create images of your heart. It provides your doctor with information about the size and shape of your heart and how well your heart's chambers and valves are working. This procedure takes approximately one hour. There are no restrictions for this procedure.    Follow-Up: At [facility], you and your health needs are our priority.  As part of our continuing mission to provide you with exceptional heart care, we have created designated Provider Care Teams.  These Care Teams include your primary Cardiologist (physician) and Advanced Practice Providers (APPs -  Physician Assistants and Nurse Practitioners) who all work together to provide you with the care you need, when you need it. You will need a follow up appointment in:  12 months.  Please call our office 2 months in advance to schedule this appointment.  You may see Mertie Moores, MD or one of the following Advanced Practice Providers on your designated Care Team: Richardson Dopp, PA-C [place], [place] . Daune Perch, NP  Any Other Special Instructions Will Be Listed Below (If Applicable).

## 2018-10-17 ENCOUNTER — Other Ambulatory Visit: Payer: Self-pay | Admitting: Cardiology

## 2018-10-22 ENCOUNTER — Other Ambulatory Visit: Payer: Self-pay

## 2018-10-22 ENCOUNTER — Ambulatory Visit (HOSPITAL_COMMUNITY): Payer: Medicare Other | Attending: Cardiovascular Disease

## 2018-10-22 ENCOUNTER — Ambulatory Visit (INDEPENDENT_AMBULATORY_CARE_PROVIDER_SITE_OTHER): Payer: Medicare Other | Admitting: Pharmacist

## 2018-10-22 DIAGNOSIS — I351 Nonrheumatic aortic (valve) insufficiency: Secondary | ICD-10-CM | POA: Insufficient documentation

## 2018-10-22 DIAGNOSIS — I4891 Unspecified atrial fibrillation: Secondary | ICD-10-CM

## 2018-10-22 DIAGNOSIS — Z5181 Encounter for therapeutic drug level monitoring: Secondary | ICD-10-CM

## 2018-10-22 LAB — POCT INR: INR: 2.7 (ref 2.0–3.0)

## 2018-10-22 MED ORDER — WARFARIN SODIUM 5 MG PO TABS: ORAL_TABLET | ORAL | 0 refills | Status: DC

## 2018-10-22 NOTE — Patient Instructions (Signed)
Description   Continue taking Coumadin dose of 1 tablet everyday except 1/2 tablet on Sundays. Recheck INR in 6 weeks. [facility]madin Clinic [phone]

## 2018-10-23 ENCOUNTER — Encounter: Payer: Self-pay | Admitting: Physician Assistant

## 2018-11-30 DIAGNOSIS — I10 Essential (primary) hypertension: Secondary | ICD-10-CM | POA: Diagnosis not present

## 2018-11-30 DIAGNOSIS — R7301 Impaired fasting glucose: Secondary | ICD-10-CM | POA: Diagnosis not present

## 2018-11-30 DIAGNOSIS — Z125 Encounter for screening for malignant neoplasm of prostate: Secondary | ICD-10-CM | POA: Diagnosis not present

## 2018-11-30 DIAGNOSIS — R82998 Other abnormal findings in urine: Secondary | ICD-10-CM | POA: Diagnosis not present

## 2018-11-30 DIAGNOSIS — E7849 Other hyperlipidemia: Secondary | ICD-10-CM | POA: Diagnosis not present

## 2018-12-03 ENCOUNTER — Ambulatory Visit (INDEPENDENT_AMBULATORY_CARE_PROVIDER_SITE_OTHER): Payer: Medicare Other | Admitting: *Deleted

## 2018-12-03 DIAGNOSIS — I4891 Unspecified atrial fibrillation: Secondary | ICD-10-CM

## 2018-12-03 DIAGNOSIS — Z5181 Encounter for therapeutic drug level monitoring: Secondary | ICD-10-CM | POA: Diagnosis not present

## 2018-12-03 LAB — POCT INR: INR: 2.2 (ref 2.0–3.0)

## 2018-12-03 NOTE — Patient Instructions (Signed)
Description   Continue taking Coumadin dose of 1 tablet everyday except 1/2 tablet on Sundays. Recheck INR in 6 weeks. Coumadin Clinic [phone]

## 2018-12-04 ENCOUNTER — Other Ambulatory Visit: Payer: Self-pay | Admitting: Cardiology

## 2018-12-04 DIAGNOSIS — Z1212 Encounter for screening for malignant neoplasm of rectum: Secondary | ICD-10-CM | POA: Diagnosis not present

## 2018-12-07 ENCOUNTER — Other Ambulatory Visit: Payer: Self-pay | Admitting: Internal Medicine

## 2018-12-07 DIAGNOSIS — N400 Enlarged prostate without lower urinary tract symptoms: Secondary | ICD-10-CM | POA: Diagnosis not present

## 2018-12-07 DIAGNOSIS — E538 Deficiency of other specified B group vitamins: Secondary | ICD-10-CM | POA: Diagnosis not present

## 2018-12-07 DIAGNOSIS — I480 Paroxysmal atrial fibrillation: Secondary | ICD-10-CM | POA: Diagnosis not present

## 2018-12-07 DIAGNOSIS — R7301 Impaired fasting glucose: Secondary | ICD-10-CM | POA: Diagnosis not present

## 2018-12-07 DIAGNOSIS — I10 Essential (primary) hypertension: Secondary | ICD-10-CM | POA: Diagnosis not present

## 2018-12-07 DIAGNOSIS — Z6829 Body mass index (BMI) 29.0-29.9, adult: Secondary | ICD-10-CM | POA: Diagnosis not present

## 2018-12-07 DIAGNOSIS — R972 Elevated prostate specific antigen [PSA]: Secondary | ICD-10-CM | POA: Diagnosis not present

## 2018-12-07 DIAGNOSIS — G3184 Mild cognitive impairment, so stated: Secondary | ICD-10-CM | POA: Diagnosis not present

## 2018-12-07 DIAGNOSIS — Z1331 Encounter for screening for depression: Secondary | ICD-10-CM | POA: Diagnosis not present

## 2018-12-07 DIAGNOSIS — Z0000 Encounter for general adult medical examination without abnormal findings: Secondary | ICD-10-CM | POA: Diagnosis not present

## 2018-12-07 DIAGNOSIS — E7849 Other hyperlipidemia: Secondary | ICD-10-CM | POA: Diagnosis not present

## 2018-12-07 DIAGNOSIS — N182 Chronic kidney disease, stage 2 (mild): Secondary | ICD-10-CM | POA: Diagnosis not present

## 2018-12-07 DIAGNOSIS — Z7901 Long term (current) use of anticoagulants: Secondary | ICD-10-CM | POA: Diagnosis not present

## 2018-12-07 DIAGNOSIS — Z23 Encounter for immunization: Secondary | ICD-10-CM | POA: Diagnosis not present

## 2018-12-09 DIAGNOSIS — E538 Deficiency of other specified B group vitamins: Secondary | ICD-10-CM | POA: Diagnosis not present

## 2018-12-21 ENCOUNTER — Ambulatory Visit
Admission: RE | Admit: 2018-12-21 | Discharge: 2018-12-21 | Disposition: A | Discharge status: Home / Self Care | Payer: Medicare Other | Source: Ambulatory Visit | Attending: Internal Medicine | Admitting: Internal Medicine

## 2018-12-21 DIAGNOSIS — G3184 Mild cognitive impairment, so stated: Secondary | ICD-10-CM | POA: Diagnosis not present

## 2018-12-21 MED ORDER — GADOBENATE DIMEGLUMINE 529 MG/ML IV SOLN
15.0000 mL | Freq: Once | INTRAVENOUS | Status: AC | PRN
  Administered 2018-12-21: 15 mL via INTRAVENOUS

## 2018-12-23 ENCOUNTER — Other Ambulatory Visit: Payer: Self-pay | Admitting: Cardiology

## 2019-01-01 ENCOUNTER — Other Ambulatory Visit: Payer: Self-pay

## 2019-01-01 MED ORDER — WARFARIN SODIUM 5 MG PO TABS: ORAL_TABLET | ORAL | 1 refills | Status: DC

## 2019-01-01 NOTE — Telephone Encounter (Signed)
Pt wife called stating pt needs a refill on medication. Pt wife states pt would like to change pharmacy to [facility] located on [address]. Pt wife states she would like a 34 day supply. Please address. Thank you

## 2019-01-05 ENCOUNTER — Ambulatory Visit (INDEPENDENT_AMBULATORY_CARE_PROVIDER_SITE_OTHER): Payer: PRIVATE HEALTH INSURANCE | Admitting: Psychiatry

## 2019-01-05 ENCOUNTER — Encounter: Payer: Self-pay | Admitting: Psychiatry

## 2019-01-05 ENCOUNTER — Ambulatory Visit: Payer: Self-pay | Admitting: Psychiatry

## 2019-01-05 DIAGNOSIS — G301 Alzheimer's disease with late onset: Secondary | ICD-10-CM | POA: Diagnosis not present

## 2019-01-05 DIAGNOSIS — F0280 Dementia in other diseases classified elsewhere without behavioral disturbance: Secondary | ICD-10-CM | POA: Diagnosis not present

## 2019-01-05 MED ORDER — DONEPEZIL HCL 10 MG PO TABS: 10.0000 mg | ORAL_TABLET | Freq: Every day | ORAL | 3 refills | Status: DC

## 2019-01-05 MED ORDER — LITHIUM CARBONATE 150 MG PO CAPS: 150.0000 mg | ORAL_CAPSULE | Freq: Every day | ORAL | 3 refills | Status: DC

## 2019-01-05 NOTE — Progress Notes (Signed)
Russell Dillon [id number] [birth year] 73 y.o.  Subjective:   Patient ID:  Russell Dillon is a 73 y.o. (DOB 1946-05-03) male.  Chief Complaint:  Chief Complaint  Patient presents with  . Follow-up    Medication management  . Memory Loss   Last seen September HPI  Seen with wife per usual.  Russell Dillon. Russell Dillon presents to the office today for follow-up of Alzheimer's Dz.  He thinks he's done fine.  From time to time aware of memory problems but thinks it's better than it was.   Patient reports stable mood and denies depressed or irritable moods.  Patient denies any recent difficulty with anxiety.  Patient denies difficulty with sleep initiation or maintenance. Denies appetite disturbance.  Patient reports that energy and motivation have been good.   Patient denies any suicidal ideation.    Drives without getting lost per patient locally. W notices mind more sluggish in pm.  Seems a little better and attentive about shower and appearance.  She sees STM problems but LTM seems intact.  Will forget what he read in the paper yesterday.  Review of Systems:  Review of Systems  Neurological: Negative for tremors and weakness.  Psychiatric/Behavioral: Positive for confusion and decreased concentration. Negative for agitation, behavioral problems, dysphoric mood, hallucinations, self-injury, sleep disturbance and suicidal ideas. The patient is not nervous/anxious and is not hyperactive.     Medications: I have reviewed the patient's current medications.  Current Outpatient Medications  Medication Sig Dispense Refill  . Betamethasone Sodium Phosphate 12 MG/2ML SOLN Inject as directed every 30 (thirty) days.    Marland Kitchen diltiazem (CARDIZEM CD) 360 MG 24 hr capsule Take 1 capsule (360 mg total) by mouth daily. 90 capsule 3  . donepezil (ARICEPT) 10 MG tablet Take 1 tablet (10 mg total) by mouth daily. 90 tablet 3  . finasteride (PROSCAR) 5 MG tablet Take 5 mg by mouth every morning.    .  lactose free nutrition (BOOST PLUS) LIQD Take 237 mLs by mouth 3 (three) times daily between meals.  0  . lithium carbonate 150 MG capsule Take 1 capsule (150 mg total) by mouth at bedtime. 90 capsule 3  . Omega-3 Fatty Acids (DIALYVITE OMEGA-3 CONCENTRATE) 600 MG CAPS Take by mouth.    . Vitamin D, Cholecalciferol, 25 MCG (1000 UT) TABS Take 25 mcg by mouth 2 (two) times daily.    Marland Kitchen warfarin (COUMADIN) 5 MG tablet TAKE AS DIRECTED BY COUMADIN CLINIC 90 tablet 1   No current facility-administered medications for this visit.     Medication Side Effects: None  Allergies: No Known Allergies  Past Medical History:  Diagnosis Date  . Aortic valve regurgitation 10/16/2018   Echo 07/28/2017:  EF 60-65, moderate AI, aortic root and ascending aorta mildly dilated (40 mm), ascending aorta 43 mm, MAC, mild MR, mild LAE, moderate RV enlargement, trivial TR // Echo 12/19: EF 60-65, normal wall motion, mild AI, moderate BAE, ascending aorta 43 mm, aortic root 39 mm  . BPH (benign prostatic hypertrophy)   . Chronic anticoagulation   . Chronic atrial fibrillation   . Diastolic dysfunction October 2010   Normal LV systolic function  . Hx SBO   . Hyperlipidemia   . Hypertension   . Small bowel obstruction (HCC)     Family History  Problem Relation Age of Onset  . Heart attack Mother   . Hypertension Mother   . Diabetes Father   . Heart disease Father   . Colon  cancer Neg Hx   . Esophageal cancer Neg Hx   . Pancreatic cancer Neg Hx   . Prostate cancer Neg Hx   . Rectal cancer Neg Hx   . Stomach cancer Neg Hx     Social History   Socioeconomic History  . Marital status: Married    Spouse name: Not on file  . Number of children: 1  . Years of education: Not on file  . Highest education level: Not on file  Occupational History  . Occupation: Armed forces technical officer  Social Needs  . Financial resource strain: Not on file  . Food insecurity:    Worry: Not on file    Inability:  Not on file  . Transportation needs:    Medical: Not on file    Non-medical: Not on file  Tobacco Use  . Smoking status: Never Smoker  . Smokeless tobacco: Never Used  Substance and Sexual Activity  . Alcohol use: No    Alcohol/week: 0.0 standard drinks  . Drug use: No  . Sexual activity: Yes  Lifestyle  . Physical activity:    Days per week: Not on file    Minutes per session: Not on file  . Stress: Not on file  Relationships  . Social connections:    Talks on phone: Not on file    Gets together: Not on file    Attends religious service: Not on file    Active member of club or organization: Not on file    Attends meetings of clubs or organizations: Not on file    Relationship status: Not on file  . Intimate partner violence:    Fear of current or ex partner: Not on file    Emotionally abused: Not on file    Physically abused: Not on file    Forced sexual activity: Not on file  Other Topics Concern  . Not on file  Social History Narrative  . Not on file    Past Medical History, Surgical history, Social history, and Family history were reviewed and updated as appropriate.   Please see review of systems for further details on the patient's review from today.   Objective:   Physical Exam:  There were no vitals taken for this visit.  Physical Exam Constitutional:      General: He is not in acute distress.    Appearance: He is well-developed.  Musculoskeletal:        General: No deformity.  Neurological:     Mental Status: He is alert and oriented to person, place, and time.     Motor: No tremor.     Coordination: Coordination normal.     Gait: Gait normal.  Psychiatric:        Attention and Perception: Attention normal. He is attentive. He does not perceive auditory hallucinations.        Mood and Affect: Mood normal. Mood is not anxious or depressed. Affect is not labile, blunt, angry or inappropriate.        Speech: Speech normal.        Behavior: Behavior  normal. Behavior is cooperative.        Thought Content: Thought content normal. Thought content does not include homicidal or suicidal ideation. Thought content does not include homicidal or suicidal plan.        Cognition and Memory: Cognition is impaired. Memory is impaired. He exhibits impaired recent memory.        Judgment: Judgment normal.  Comments: Insight is poor.  Oriented to Feb, 2020. Not date. Not day of week.  Aware of recent snow.Fall.   Talkative without pressure. Pleasant. A little defensive about memory problems.      Lab Review:     Component Value Date/Time   NA 135 07/16/2017 1348   K 5.1 07/16/2017 1348   CL 95 (L) 07/16/2017 1348   CO2 26 07/16/2017 1348   GLUCOSE 83 07/16/2017 1348   GLUCOSE 97 07/20/2013 0502   BUN 13 07/16/2017 1348   CREATININE 0.99 07/16/2017 1348   CALCIUM 9.1 07/16/2017 1348   PROT 5.9 (L) 07/20/2013 0502   ALBUMIN 2.3 (L) 07/20/2013 0502   AST 11 07/20/2013 0502   ALT 19 07/20/2013 0502   ALKPHOS 68 07/20/2013 0502   BILITOT 0.6 07/20/2013 0502   GFRNONAA 75 07/16/2017 1348   GFRAA 87 07/16/2017 1348       Component Value Date/Time   WBC 5.9 07/16/2017 1348   WBC 7.3 06/30/2015 1310   WBC 5.4 03/20/2015 1534   RBC 4.75 07/16/2017 1348   RBC 4.84 06/30/2015 1310   RBC 4.36 03/20/2015 1534   HGB 14.0 07/16/2017 1348   HCT 41.5 07/16/2017 1348   PLT 246 07/16/2017 1348   MCV 87 07/16/2017 1348   MCH 29.5 07/16/2017 1348   MCH 27.0 06/30/2015 1310   MCH 28.8 07/20/2013 0502   MCHC 33.7 07/16/2017 1348   MCHC 31.5 (A) 06/30/2015 1310   MCHC 33.8 03/20/2015 1534   RDW 15.1 07/16/2017 1348   LYMPHSABS 1.0 03/20/2015 1534   MONOABS 0.4 03/20/2015 1534   EOSABS 0.1 03/20/2015 1534   BASOSABS 0.0 03/20/2015 1534    No results found for: POCLITH, LITHIUM   No results found for: PHENYTOIN, PHENOBARB, VALPROATE, CBMZ   .res Assessment: Plan:    Late onset Alzheimer's disease without behavioral disturbance ([place])    Again disc the off label use of lithium for cognitive protection and continuing the use of Aricept at usual dose. Still in the early stages of Alzheimer's.  She feels he's safe driving to usual local places.  No wrecks or MVA.  Cautioned about this.  Disc dx of Alzheimer's and progression.  Continue meds without changes.  No indication to further change medications or add medications to complicate the med regimen.  Answered questions about statin meds.    Disc value of mental and physical stimulation to help protect cognition.  Enc exercise.  This appt was 30 mins.  FU 6 mos.  Lynder Parents, MD, DFAPA     Please see After Visit Summary for patient specific instructions.  Future Appointments  Date Time Provider [place]  01/14/2019 10:00 AM [facility]    No orders of the defined types were placed in this encounter.     -------------------------------

## 2019-01-07 DIAGNOSIS — D1801 Hemangioma of skin and subcutaneous tissue: Secondary | ICD-10-CM | POA: Diagnosis not present

## 2019-01-07 DIAGNOSIS — L570 Actinic keratosis: Secondary | ICD-10-CM | POA: Diagnosis not present

## 2019-01-07 DIAGNOSIS — L821 Other seborrheic keratosis: Secondary | ICD-10-CM | POA: Diagnosis not present

## 2019-01-07 DIAGNOSIS — D2372 Other benign neoplasm of skin of left lower limb, including hip: Secondary | ICD-10-CM | POA: Diagnosis not present

## 2019-01-07 DIAGNOSIS — D692 Other nonthrombocytopenic purpura: Secondary | ICD-10-CM | POA: Diagnosis not present

## 2019-01-12 DIAGNOSIS — E538 Deficiency of other specified B group vitamins: Secondary | ICD-10-CM | POA: Diagnosis not present

## 2019-01-14 ENCOUNTER — Ambulatory Visit (INDEPENDENT_AMBULATORY_CARE_PROVIDER_SITE_OTHER): Payer: Medicare Other | Admitting: Pharmacist

## 2019-01-14 DIAGNOSIS — I4891 Unspecified atrial fibrillation: Secondary | ICD-10-CM

## 2019-01-14 DIAGNOSIS — Z5181 Encounter for therapeutic drug level monitoring: Secondary | ICD-10-CM

## 2019-01-14 LAB — POCT INR: INR: 3 (ref 2.0–3.0)

## 2019-01-14 NOTE — Patient Instructions (Signed)
Continue taking Coumadin dose of 1 tablet everyday except 1/2 tablet on Sundays. Recheck INR in 6 weeks. Coumadin Clinic [phone]

## 2019-01-19 ENCOUNTER — Telehealth: Payer: Self-pay

## 2019-01-19 ENCOUNTER — Other Ambulatory Visit: Payer: Self-pay

## 2019-01-19 MED ORDER — LITHIUM CARBONATE 150 MG PO CAPS: 150.0000 mg | ORAL_CAPSULE | Freq: Every day | ORAL | 0 refills | Status: DC

## 2019-01-19 NOTE — Telephone Encounter (Signed)
[facility] contacted provider lithium 150 mg capsule was on back order. Contacted [facility] on [address] they were also on back order. Was able to submit Rx for #90 to [facility] on [address].  Wife [name] aware.

## 2019-02-08 ENCOUNTER — Other Ambulatory Visit: Payer: Self-pay

## 2019-02-08 ENCOUNTER — Encounter (HOSPITAL_COMMUNITY): Payer: Self-pay

## 2019-02-08 ENCOUNTER — Emergency Department (HOSPITAL_COMMUNITY): Payer: Medicare Other

## 2019-02-08 ENCOUNTER — Inpatient Hospital Stay (HOSPITAL_COMMUNITY)
Admission: EM | Admit: 2019-02-08 | Discharge: 2019-02-15 | DRG: 908 | Disposition: A | Discharge status: Rehabilitation Facility | Payer: Medicare Other | Source: Ambulatory Visit | Attending: Internal Medicine | Admitting: Internal Medicine

## 2019-02-08 DIAGNOSIS — G8918 Other acute postprocedural pain: Secondary | ICD-10-CM

## 2019-02-08 DIAGNOSIS — R509 Fever, unspecified: Secondary | ICD-10-CM

## 2019-02-08 DIAGNOSIS — I110 Hypertensive heart disease with heart failure: Secondary | ICD-10-CM | POA: Diagnosis present

## 2019-02-08 DIAGNOSIS — S86812A Strain of other muscle(s) and tendon(s) at lower leg level, left leg, initial encounter: Secondary | ICD-10-CM | POA: Diagnosis present

## 2019-02-08 DIAGNOSIS — Z8249 Family history of ischemic heart disease and other diseases of the circulatory system: Secondary | ICD-10-CM

## 2019-02-08 DIAGNOSIS — S83232A Complex tear of medial meniscus, current injury, left knee, initial encounter: Secondary | ICD-10-CM | POA: Diagnosis present

## 2019-02-08 DIAGNOSIS — F319 Bipolar disorder, unspecified: Secondary | ICD-10-CM | POA: Diagnosis present

## 2019-02-08 DIAGNOSIS — I4891 Unspecified atrial fibrillation: Secondary | ICD-10-CM | POA: Diagnosis not present

## 2019-02-08 DIAGNOSIS — R6520 Severe sepsis without septic shock: Secondary | ICD-10-CM | POA: Diagnosis not present

## 2019-02-08 DIAGNOSIS — I5030 Unspecified diastolic (congestive) heart failure: Secondary | ICD-10-CM | POA: Diagnosis present

## 2019-02-08 DIAGNOSIS — S82092A Other fracture of left patella, initial encounter for closed fracture: Secondary | ICD-10-CM | POA: Diagnosis not present

## 2019-02-08 DIAGNOSIS — N400 Enlarged prostate without lower urinary tract symptoms: Secondary | ICD-10-CM

## 2019-02-08 DIAGNOSIS — T45515A Adverse effect of anticoagulants, initial encounter: Secondary | ICD-10-CM | POA: Diagnosis present

## 2019-02-08 DIAGNOSIS — M009 Pyogenic arthritis, unspecified: Secondary | ICD-10-CM | POA: Diagnosis present

## 2019-02-08 DIAGNOSIS — S72402A Unspecified fracture of lower end of left femur, initial encounter for closed fracture: Secondary | ICD-10-CM | POA: Diagnosis present

## 2019-02-08 DIAGNOSIS — I10 Essential (primary) hypertension: Secondary | ICD-10-CM | POA: Diagnosis not present

## 2019-02-08 DIAGNOSIS — F0390 Unspecified dementia without behavioral disturbance: Secondary | ICD-10-CM | POA: Diagnosis present

## 2019-02-08 DIAGNOSIS — R4189 Other symptoms and signs involving cognitive functions and awareness: Secondary | ICD-10-CM | POA: Diagnosis present

## 2019-02-08 DIAGNOSIS — I482 Chronic atrial fibrillation, unspecified: Secondary | ICD-10-CM | POA: Diagnosis present

## 2019-02-08 DIAGNOSIS — M7989 Other specified soft tissue disorders: Secondary | ICD-10-CM | POA: Diagnosis not present

## 2019-02-08 DIAGNOSIS — S82145A Nondisplaced bicondylar fracture of left tibia, initial encounter for closed fracture: Secondary | ICD-10-CM | POA: Diagnosis present

## 2019-02-08 DIAGNOSIS — R4182 Altered mental status, unspecified: Secondary | ICD-10-CM | POA: Diagnosis not present

## 2019-02-08 DIAGNOSIS — N182 Chronic kidney disease, stage 2 (mild): Secondary | ICD-10-CM | POA: Diagnosis not present

## 2019-02-08 DIAGNOSIS — S76192A Other specified injury of left quadriceps muscle, fascia and tendon, initial encounter: Secondary | ICD-10-CM | POA: Diagnosis not present

## 2019-02-08 DIAGNOSIS — M25062 Hemarthrosis, left knee: Secondary | ICD-10-CM | POA: Diagnosis present

## 2019-02-08 DIAGNOSIS — R6510 Systemic inflammatory response syndrome (SIRS) of non-infectious origin without acute organ dysfunction: Secondary | ICD-10-CM | POA: Diagnosis present

## 2019-02-08 DIAGNOSIS — I480 Paroxysmal atrial fibrillation: Secondary | ICD-10-CM | POA: Diagnosis not present

## 2019-02-08 DIAGNOSIS — R972 Elevated prostate specific antigen [PSA]: Secondary | ICD-10-CM | POA: Diagnosis not present

## 2019-02-08 DIAGNOSIS — Z833 Family history of diabetes mellitus: Secondary | ICD-10-CM

## 2019-02-08 DIAGNOSIS — K56609 Unspecified intestinal obstruction, unspecified as to partial versus complete obstruction: Secondary | ICD-10-CM | POA: Diagnosis not present

## 2019-02-08 DIAGNOSIS — D6832 Hemorrhagic disorder due to extrinsic circulating anticoagulants: Secondary | ICD-10-CM | POA: Diagnosis present

## 2019-02-08 DIAGNOSIS — Z79899 Other long term (current) drug therapy: Secondary | ICD-10-CM

## 2019-02-08 DIAGNOSIS — K8020 Calculus of gallbladder without cholecystitis without obstruction: Secondary | ICD-10-CM | POA: Diagnosis not present

## 2019-02-08 DIAGNOSIS — S83272A Complex tear of lateral meniscus, current injury, left knee, initial encounter: Secondary | ICD-10-CM | POA: Diagnosis present

## 2019-02-08 DIAGNOSIS — M1712 Unilateral primary osteoarthritis, left knee: Secondary | ICD-10-CM | POA: Diagnosis present

## 2019-02-08 DIAGNOSIS — Z9181 History of falling: Secondary | ICD-10-CM

## 2019-02-08 DIAGNOSIS — M109 Gout, unspecified: Secondary | ICD-10-CM | POA: Diagnosis present

## 2019-02-08 DIAGNOSIS — M25462 Effusion, left knee: Secondary | ICD-10-CM | POA: Diagnosis not present

## 2019-02-08 DIAGNOSIS — G3184 Mild cognitive impairment, so stated: Secondary | ICD-10-CM | POA: Diagnosis not present

## 2019-02-08 DIAGNOSIS — A419 Sepsis, unspecified organism: Secondary | ICD-10-CM | POA: Diagnosis not present

## 2019-02-08 DIAGNOSIS — Z7901 Long term (current) use of anticoagulants: Secondary | ICD-10-CM | POA: Diagnosis not present

## 2019-02-08 DIAGNOSIS — G8929 Other chronic pain: Secondary | ICD-10-CM | POA: Diagnosis present

## 2019-02-08 DIAGNOSIS — M25562 Pain in left knee: Secondary | ICD-10-CM | POA: Diagnosis not present

## 2019-02-08 DIAGNOSIS — D62 Acute posthemorrhagic anemia: Secondary | ICD-10-CM | POA: Diagnosis present

## 2019-02-08 DIAGNOSIS — Z6829 Body mass index (BMI) 29.0-29.9, adult: Secondary | ICD-10-CM | POA: Diagnosis not present

## 2019-02-08 DIAGNOSIS — M869 Osteomyelitis, unspecified: Secondary | ICD-10-CM | POA: Diagnosis present

## 2019-02-08 DIAGNOSIS — E785 Hyperlipidemia, unspecified: Secondary | ICD-10-CM | POA: Diagnosis present

## 2019-02-08 DIAGNOSIS — D638 Anemia in other chronic diseases classified elsewhere: Secondary | ICD-10-CM | POA: Diagnosis present

## 2019-02-08 DIAGNOSIS — S86819A Strain of other muscle(s) and tendon(s) at lower leg level, unspecified leg, initial encounter: Secondary | ICD-10-CM

## 2019-02-08 DIAGNOSIS — R600 Localized edema: Secondary | ICD-10-CM | POA: Diagnosis not present

## 2019-02-08 DIAGNOSIS — D649 Anemia, unspecified: Secondary | ICD-10-CM | POA: Diagnosis not present

## 2019-02-08 HISTORY — DX: Strain of other muscle(s) and tendon(s) at lower leg level, left leg, initial encounter: S86.812A

## 2019-02-08 LAB — CBC WITH DIFFERENTIAL/PLATELET
Abs Immature Granulocytes: 0.09 10*3/uL — ABNORMAL HIGH (ref 0.00–0.07)
Basophils Absolute: 0 10*3/uL (ref 0.0–0.1)
Basophils Relative: 0 %
Eosinophils Absolute: 0 10*3/uL (ref 0.0–0.5)
Eosinophils Relative: 0 %
HEMATOCRIT: 33.7 % — AB (ref 39.0–52.0)
Hemoglobin: 10.8 g/dL — ABNORMAL LOW (ref 13.0–17.0)
Immature Granulocytes: 1 %
LYMPHS ABS: 0.8 10*3/uL (ref 0.7–4.0)
Lymphocytes Relative: 4 %
MCH: 28.3 pg (ref 26.0–34.0)
MCHC: 32 g/dL (ref 30.0–36.0)
MCV: 88.2 fL (ref 80.0–100.0)
MONOS PCT: 7 %
Monocytes Absolute: 1.3 10*3/uL — ABNORMAL HIGH (ref 0.1–1.0)
Neutro Abs: 16.3 10*3/uL — ABNORMAL HIGH (ref 1.7–7.7)
Neutrophils Relative %: 88 %
Platelets: 299 10*3/uL (ref 150–400)
RBC: 3.82 MIL/uL — ABNORMAL LOW (ref 4.22–5.81)
RDW: 14.4 % (ref 11.5–15.5)
WBC: 18.4 10*3/uL — ABNORMAL HIGH (ref 4.0–10.5)
nRBC: 0 % (ref 0.0–0.2)

## 2019-02-08 LAB — COMPREHENSIVE METABOLIC PANEL
ALT: 22 U/L (ref 0–44)
AST: 26 U/L (ref 15–41)
Albumin: 2.7 g/dL — ABNORMAL LOW (ref 3.5–5.0)
Alkaline Phosphatase: 97 U/L (ref 38–126)
Anion gap: 12 (ref 5–15)
BUN: 26 mg/dL — ABNORMAL HIGH (ref 8–23)
CO2: 22 mmol/L (ref 22–32)
Calcium: 8.9 mg/dL (ref 8.9–10.3)
Chloride: 99 mmol/L (ref 98–111)
Creatinine, Ser: 1.17 mg/dL (ref 0.61–1.24)
GFR calc Af Amer: >60 mL/min (ref >60)
GFR calc non Af Amer: >60 mL/min (ref >60)
Glucose, Bld: 138 mg/dL — ABNORMAL HIGH (ref 70–99)
Potassium: 3.7 mmol/L (ref 3.5–5.1)
Sodium: 133 mmol/L — ABNORMAL LOW (ref 135–145)
Total Bilirubin: 1.9 mg/dL — ABNORMAL HIGH (ref 0.3–1.2)
Total Protein: 7.6 g/dL (ref 6.5–8.1)

## 2019-02-08 LAB — URINALYSIS, COMPLETE (UACMP) WITH MICROSCOPIC
Bilirubin Urine: NEGATIVE
Glucose, UA: NEGATIVE mg/dL
Ketones, ur: NEGATIVE mg/dL
Leukocytes,Ua: NEGATIVE
Nitrite: NEGATIVE
PROTEIN: 30 mg/dL — AB
RBC / HPF: >50 RBC/hpf — ABNORMAL HIGH (ref 0–5)
Specific Gravity, Urine: 1.029 (ref 1.005–1.030)
pH: 5 (ref 5.0–8.0)

## 2019-02-08 LAB — PROTIME-INR
INR: 4.3 — AB (ref 0.8–1.2)
Prothrombin Time: 40.7 seconds — ABNORMAL HIGH (ref 11.4–15.2)

## 2019-02-08 LAB — BRAIN NATRIURETIC PEPTIDE: B Natriuretic Peptide: 220.8 pg/mL — ABNORMAL HIGH (ref 0.0–100.0)

## 2019-02-08 LAB — LITHIUM LEVEL: Lithium Lvl: 0.14 mmol/L — ABNORMAL LOW (ref 0.60–1.20)

## 2019-02-08 LAB — CBG MONITORING, ED: Glucose-Capillary: 118 mg/dL — ABNORMAL HIGH (ref 70–99)

## 2019-02-08 LAB — LACTIC ACID, PLASMA: Lactic Acid, Venous: 1.8 mmol/L (ref 0.5–1.9)

## 2019-02-08 MED ORDER — SODIUM CHLORIDE 0.9 % IV SOLN: 1000.0000 mL | INTRAVENOUS | Status: DC

## 2019-02-08 MED ORDER — PIPERACILLIN-TAZOBACTAM 3.375 G IVPB 30 MIN
3.3750 g | Freq: Once | INTRAVENOUS | Status: AC
  Administered 2019-02-08: 3.375 g via INTRAVENOUS
  Filled 2019-02-08: qty 50

## 2019-02-08 MED ORDER — SODIUM CHLORIDE 0.9 % IV SOLN
INTRAVENOUS | Status: DC
  Administered 2019-02-08: 18:00:00 via INTRAVENOUS

## 2019-02-08 MED ORDER — IOHEXOL 300 MG/ML  SOLN
100.0000 mL | Freq: Once | INTRAMUSCULAR | Status: AC | PRN
  Administered 2019-02-08: 100 mL via INTRAVENOUS

## 2019-02-08 MED ORDER — SODIUM CHLORIDE 0.9 % IV BOLUS
1000.0000 mL | Freq: Once | INTRAVENOUS | Status: AC
  Administered 2019-02-08: 1000 mL via INTRAVENOUS

## 2019-02-08 MED ORDER — SODIUM CHLORIDE 0.9 % IV BOLUS (SEPSIS)
250.0000 mL | Freq: Once | INTRAVENOUS | Status: AC
  Administered 2019-02-08: 250 mL via INTRAVENOUS

## 2019-02-08 MED ORDER — VANCOMYCIN HCL IN DEXTROSE 1-5 GM/200ML-% IV SOLN
1000.0000 mg | Freq: Once | INTRAVENOUS | Status: AC
  Administered 2019-02-08: 1000 mg via INTRAVENOUS
  Filled 2019-02-08: qty 200

## 2019-02-08 MED ORDER — ACETAMINOPHEN 500 MG PO TABS
1000.0000 mg | ORAL_TABLET | Freq: Once | ORAL | Status: AC
  Administered 2019-02-08: 1000 mg via ORAL
  Filled 2019-02-08: qty 2

## 2019-02-08 NOTE — ED Notes (Signed)
Pt's sps. [name] Pott [phone]) [phone]) Pls contact for updates

## 2019-02-08 NOTE — ED Notes (Signed)
INR 4.3 given to [facility] RN

## 2019-02-08 NOTE — ED Provider Notes (Signed)
  Physical Exam  BP 135/69   Pulse (!) 112   Temp (!) 101.2 F (38.4 C) (Rectal)   Resp 17   Ht 5' 10.47" (1.79 m)   Wt 93.4 kg   SpO2 100%   BMI 29.16 kg/m   Physical Exam  ED Course/Procedures     .Critical Care Performed by: Gareth Morgan, MD Authorized by: Gareth Morgan, MD   Critical care provider statement:    Critical care time (minutes):  30   Critical care was time spent personally by me on the following activities:  Ordering and review of laboratory studies, ordering and review of radiographic studies, ordering and performing treatments and interventions, re-evaluation of patient's condition, examination of patient and development of treatment plan with patient or surrogate    MDM   Received care of patient at 4 PM from Dr. Vanita Panda.  Please see his note for prior history, physical and care.  Briefly this is a 73 year old male who with a history of dementia, hypertension, hyperlipidemia, BPH, chronic atrial fibrillation on Coumadin, aortic valve regurgitation, who presents with concern for decreased appetite, generalized weakness, found to have fever on arrival to ED.  UA and CT pending, anticipate admission.  Ordered IV vanc and zosyn for fever of unknown source.  CT without acute findings on head or abdomen.  UA without infection.  Discussed patient's case with wife and evaluated the patient. He has significant left knee pain, however it appears bruised. He does not remember fall. Wife reports he hit his left knee while at church 2 weeks ago and had abrasion and pain.  She also reports 10 years ago he had a "cyst of the left knee" that has caused him pain chronically, but was worse in the last 2 weeks after trauma. Reports he was told "it was a cyst weaving around a muscle ligaments of his leg."  She reports he had difficulty getting up today. On my exam he has no sign of right sided weakness. Doubt CVA in setting of fever and suspect this was related to generalized  weakness.  Ordered XR of knee. Doubt septic arthritis as etiology of fever given history of trauma and contusion of knee without overlying erythema.  Doubt meningitis given no meningeal signs, pt with dementia but appropriate on my exam.  Blood pressures trending down. Given unclear cause of fever at this time, consider viral infection or COVID19. Given MAPs are above 65 and he does not have change in mentation, and concern for possible COVID as etiology of his fever, will not pursue aggressive fluid resuscitation unless his blood pressures decrease.  Currently 95 systolic with MAPs 70.  Increased IV fluid rate and gave 250cc bolus.    Admitted to Dr. Hal Hope.  XR knee does show ?osteomyelitis. Notified Dr. Hal Hope. Will order MR knee WWO contrast. Updated wife regarding plan.        Gareth Morgan, MD 02/08/19 2215

## 2019-02-08 NOTE — ED Triage Notes (Signed)
Pt from [facility] for further evaluation of bilateral leg pain, elevated white count, fever, increased weakness, and altered mental status; hx dementia

## 2019-02-08 NOTE — ED Notes (Signed)
With in and out cath pt had red urine upon entry to the bladder and when the catheter tube was withdrawn there was one blood clot in the tube.  EDP made aware.

## 2019-02-08 NOTE — ED Provider Notes (Signed)
[facility] EMERGENCY DEPARTMENT Provider Note   CSN: [id number] Arrival date & time: 02/08/19  1335    History   Chief Complaint Chief Complaint  Patient presents with  . Leg Pain  . Altered Mental Status  . Fever    HPI Russell Dillon is a 73 y.o. male.     HPI  Patient presents with concern of fever. The patient self is a repetitive historian, cannot offer any details about what is been happening recently. He currently denies physical complaints, beyond possibly feeling weak.  Patient is in a nursing facility, attempts to contact his wife for additional details of his HPI were initially unsuccessful.       Past Medical History:  Diagnosis Date  . Aortic valve regurgitation 10/16/2018   Echo 07/28/2017:  EF 60-65, moderate AI, aortic root and ascending aorta mildly dilated (40 mm), ascending aorta 43 mm, MAC, mild MR, mild LAE, moderate RV enlargement, trivial TR // Echo 12/19: EF 60-65, normal wall motion, mild AI, moderate BAE, ascending aorta 43 mm, aortic root 39 mm  . BPH (benign prostatic hypertrophy)   . Chronic anticoagulation   . Chronic atrial fibrillation   . Diastolic dysfunction October 2010   Normal LV systolic function  . Hx SBO   . Hyperlipidemia   . Hypertension   . Small bowel obstruction [facility])     Patient Active Problem List   Diagnosis Date Noted  . Aortic valve regurgitation 10/16/2018  . Encounter for therapeutic drug monitoring 12/02/2013  . Small bowel obstruction ([place]) 07/08/2013  . Hyperlipidemia 12/20/2011  . HTN (hypertension) 06/04/2011  . Atrial fibrillation ([place]) 03/01/2011  . Long term (current) use of anticoagulants 03/01/2011  . GERD 10/11/2009  . DIVERTICULOSIS OF COLON 10/11/2009    Past Surgical History:  Procedure Laterality Date  . APPENDECTOMY  2004  . CARDIOVASCULAR STRESS TEST  09/30/2007   EF 67%  No ischemia.   Marland Kitchen CARDIOVERSION  05/01/2004  . SMALL INTESTINE SURGERY  2004  . US  ECHOCARDIOGRAPHY  09/05/2009   ef 55-60%        Home Medications    Prior to Admission medications   Medication Sig Start Date End Date Taking? Authorizing Provider  Betamethasone Sodium Phosphate 12 MG/2ML SOLN Inject as directed every 30 (thirty) days.    [provider]  diltiazem (CARDIZEM CD) 360 MG 24 hr capsule Take 1 capsule (360 mg total) by mouth daily. 12/23/18   Larey Dresser, MD  donepezil (ARICEPT) 10 MG tablet Take 1 tablet (10 mg total) by mouth daily. 01/05/19   Cottle, Billey Co., MD  finasteride (PROSCAR) 5 MG tablet Take 5 mg by mouth every morning.    [provider]  lactose free nutrition (BOOST PLUS) LIQD Take 237 mLs by mouth 3 (three) times daily between meals. 07/20/13   Marton Redwood, MD  lithium carbonate 150 MG capsule Take 1 capsule (150 mg total) by mouth at bedtime. 01/19/19   Cottle, Billey Co., MD  Omega-3 Fatty Acids (DIALYVITE OMEGA-3 CONCENTRATE) 600 MG CAPS Take by mouth.    [provider]  Vitamin D, Cholecalciferol, 25 MCG (1000 UT) TABS Take 25 mcg by mouth 2 (two) times daily.    [provider]  warfarin (COUMADIN) 5 MG tablet TAKE AS DIRECTED BY COUMADIN CLINIC 01/01/19   Larey Dresser, MD    Family History Family History  Problem Relation Age of Onset  . Heart attack Mother   .  Hypertension Mother   . Diabetes Father   . Heart disease Father   . Colon cancer Neg Hx   . Esophageal cancer Neg Hx   . Pancreatic cancer Neg Hx   . Prostate cancer Neg Hx   . Rectal cancer Neg Hx   . Stomach cancer Neg Hx     Social History Social History   Tobacco Use  . Smoking status: Never Smoker  . Smokeless tobacco: Never Used  Substance Use Topics  . Alcohol use: No    Alcohol/week: 0.0 standard drinks  . Drug use: No     Allergies   Patient has no known allergies.   Review of Systems Review of Systems  Unable to perform ROS: Dementia     Physical Exam Updated Vital Signs BP 105/73    Pulse (!) 102   Temp (!) 101.2 F (38.4 C) (Rectal)   Resp 17   Ht 5' 10.47" (1.79 m)   Wt 93.4 kg   SpO2 98%   BMI 29.16 kg/m   Physical Exam Vitals signs and nursing note reviewed.  Constitutional:      General: He is not in acute distress.    Appearance: He is well-developed.  HENT:     Head: Normocephalic and atraumatic.  Eyes:     Conjunctiva/sclera: Conjunctivae normal.  Cardiovascular:     Rate and Rhythm: Normal rate and regular rhythm.  Pulmonary:     Effort: Pulmonary effort is normal. No respiratory distress.     Breath sounds: No stridor.  Abdominal:     General: There is no distension.  Musculoskeletal:     Right lower leg: Edema present.     Left lower leg: Edema present.  Skin:    General: Skin is warm and dry.  Neurological:     Mental Status: He is alert.     Motor: Atrophy present.     Comments: Patient moves all extremities spontaneously, though he does have diminished capacity to lift either leg against gravity. Face is symmetric, speech is clear, though the patient is repetitive.  Psychiatric:        Attention and Perception: Attention normal.        Cognition and Memory: Cognition is impaired.      ED Treatments / Results  Labs (all labs ordered are listed, but only abnormal results are displayed) Labs Reviewed  COMPREHENSIVE METABOLIC PANEL - Abnormal; Notable for the following components:      Result Value   Sodium 133 (*)    Glucose, Bld 138 (*)    BUN 26 (*)    Albumin 2.7 (*)    Total Bilirubin 1.9 (*)    All other components within normal limits  CBC WITH DIFFERENTIAL/PLATELET - Abnormal; Notable for the following components:   WBC 18.4 (*)    RBC 3.82 (*)    Hemoglobin 10.8 (*)    HCT 33.7 (*)    Neutro Abs 16.3 (*)    Monocytes Absolute 1.3 (*)    Abs Immature Granulocytes 0.09 (*)    All other components within normal limits  PROTIME-INR - Abnormal; Notable for the following components:   Prothrombin Time 40.7 (*)    INR  4.3 (*)    All other components within normal limits  LITHIUM LEVEL - Abnormal; Notable for the following components:   Lithium Lvl 0.14 (*)    All other components within normal limits  CBG MONITORING, ED - Abnormal; Notable for the following components:   Glucose-Capillary  118 (*)    All other components within normal limits  URINE CULTURE  URINALYSIS, COMPLETE (UACMP) WITH MICROSCOPIC    EKG EKG Interpretation  Date/Time:  Monday February 08 2019 13:47:42 EDT Ventricular Rate:  137 PR Interval:    QRS Duration: 81 QT Interval:  294 QTC Calculation: 444 R Axis:   98 Text Interpretation:  Atrial fibrillation Right axis deviation Abnormal ekg Confirmed by Carmin Muskrat [phone]) on 02/08/2019 2:23:22 PM   Radiology Dg Chest Port 1 View  Result Date: 02/08/2019 CLINICAL DATA:  Fever, altered mental status, leukocytosis. EXAM: PORTABLE CHEST 1 VIEW COMPARISON:  Chest x-ray dated July 10, 2015. FINDINGS: The heart size and mediastinal contours are within normal limits. Atherosclerotic calcification of the aortic arch. Normal pulmonary vascularity. No focal consolidation, pleural effusion, or pneumothorax. No acute osseous abnormality. IMPRESSION: No active disease. Electronically Signed   By: Titus Dubin M.D.   On: 02/08/2019 14:23    Procedures Procedures (including critical care time)  Medications Ordered in ED Medications  sodium chloride 0.9 % bolus 1,000 mL (0 mLs Intravenous Stopped 02/08/19 1554)    And  0.9 %  sodium chloride infusion (has no administration in time range)  acetaminophen (TYLENOL) tablet 1,000 mg (has no administration in time range)     Initial Impression / Assessment and Plan / ED Course  I have reviewed the triage vital signs and the nursing notes.  Pertinent labs & imaging results that were available during my care of the patient were reviewed by me and considered in my medical decision making (see chart for details).    4:01 PM I spoke with  the patient's wife about his condition.  On she notes that he has been declining for about 1 week, with substantial decrease in appetite, decreased bowel movements, but no report of abdominal pain. She is unaware of a fever until today She does have some concern for possible mini stroke earlier in the week, versus infection given his decline in functionality. On repeat exam the patient is calm, in no distress. Patient's CT scan pending, urinalysis pending. Patient has received antibiotics for fever, leukocytosis, confusion, likely will require admission. Dr. Billy Fischer is aware of the patient and his presentation.  Final Clinical Impressions(s) / ED Diagnoses  Altered mental status Fever   Carmin Muskrat, MD 02/08/19 [phone]

## 2019-02-08 NOTE — ED Notes (Signed)
Dr. Vanita Panda aware of INR of 4.3

## 2019-02-09 ENCOUNTER — Encounter (HOSPITAL_COMMUNITY): Payer: Self-pay | Admitting: Internal Medicine

## 2019-02-09 DIAGNOSIS — M869 Osteomyelitis, unspecified: Secondary | ICD-10-CM | POA: Diagnosis present

## 2019-02-09 DIAGNOSIS — S86812D Strain of other muscle(s) and tendon(s) at lower leg level, left leg, subsequent encounter: Secondary | ICD-10-CM | POA: Diagnosis not present

## 2019-02-09 DIAGNOSIS — K56609 Unspecified intestinal obstruction, unspecified as to partial versus complete obstruction: Secondary | ICD-10-CM | POA: Diagnosis present

## 2019-02-09 DIAGNOSIS — R600 Localized edema: Secondary | ICD-10-CM | POA: Diagnosis not present

## 2019-02-09 DIAGNOSIS — R2689 Other abnormalities of gait and mobility: Secondary | ICD-10-CM | POA: Diagnosis not present

## 2019-02-09 DIAGNOSIS — M00862 Arthritis due to other bacteria, left knee: Secondary | ICD-10-CM | POA: Diagnosis not present

## 2019-02-09 DIAGNOSIS — S83242A Other tear of medial meniscus, current injury, left knee, initial encounter: Secondary | ICD-10-CM | POA: Diagnosis not present

## 2019-02-09 DIAGNOSIS — M009 Pyogenic arthritis, unspecified: Secondary | ICD-10-CM | POA: Diagnosis present

## 2019-02-09 DIAGNOSIS — S83232A Complex tear of medial meniscus, current injury, left knee, initial encounter: Secondary | ICD-10-CM | POA: Diagnosis not present

## 2019-02-09 DIAGNOSIS — Z7901 Long term (current) use of anticoagulants: Secondary | ICD-10-CM | POA: Diagnosis not present

## 2019-02-09 DIAGNOSIS — M25562 Pain in left knee: Secondary | ICD-10-CM | POA: Diagnosis not present

## 2019-02-09 DIAGNOSIS — E8809 Other disorders of plasma-protein metabolism, not elsewhere classified: Secondary | ICD-10-CM | POA: Diagnosis present

## 2019-02-09 DIAGNOSIS — S86812S Strain of other muscle(s) and tendon(s) at lower leg level, left leg, sequela: Secondary | ICD-10-CM | POA: Diagnosis not present

## 2019-02-09 DIAGNOSIS — A419 Sepsis, unspecified organism: Secondary | ICD-10-CM | POA: Diagnosis not present

## 2019-02-09 DIAGNOSIS — D6832 Hemorrhagic disorder due to extrinsic circulating anticoagulants: Secondary | ICD-10-CM | POA: Diagnosis present

## 2019-02-09 DIAGNOSIS — D62 Acute posthemorrhagic anemia: Secondary | ICD-10-CM | POA: Diagnosis present

## 2019-02-09 DIAGNOSIS — R6510 Systemic inflammatory response syndrome (SIRS) of non-infectious origin without acute organ dysfunction: Secondary | ICD-10-CM | POA: Diagnosis present

## 2019-02-09 DIAGNOSIS — G8918 Other acute postprocedural pain: Secondary | ICD-10-CM | POA: Diagnosis not present

## 2019-02-09 DIAGNOSIS — Z952 Presence of prosthetic heart valve: Secondary | ICD-10-CM | POA: Diagnosis not present

## 2019-02-09 DIAGNOSIS — S76112D Strain of left quadriceps muscle, fascia and tendon, subsequent encounter: Secondary | ICD-10-CM | POA: Diagnosis not present

## 2019-02-09 DIAGNOSIS — M109 Gout, unspecified: Secondary | ICD-10-CM | POA: Diagnosis present

## 2019-02-09 DIAGNOSIS — M1712 Unilateral primary osteoarthritis, left knee: Secondary | ICD-10-CM | POA: Diagnosis present

## 2019-02-09 DIAGNOSIS — Z4789 Encounter for other orthopedic aftercare: Secondary | ICD-10-CM | POA: Diagnosis not present

## 2019-02-09 DIAGNOSIS — D649 Anemia, unspecified: Secondary | ICD-10-CM | POA: Diagnosis present

## 2019-02-09 DIAGNOSIS — M25062 Hemarthrosis, left knee: Secondary | ICD-10-CM | POA: Diagnosis present

## 2019-02-09 DIAGNOSIS — R509 Fever, unspecified: Secondary | ICD-10-CM | POA: Diagnosis not present

## 2019-02-09 DIAGNOSIS — F0390 Unspecified dementia without behavioral disturbance: Secondary | ICD-10-CM | POA: Diagnosis present

## 2019-02-09 DIAGNOSIS — I10 Essential (primary) hypertension: Secondary | ICD-10-CM | POA: Diagnosis not present

## 2019-02-09 DIAGNOSIS — F319 Bipolar disorder, unspecified: Secondary | ICD-10-CM | POA: Diagnosis not present

## 2019-02-09 DIAGNOSIS — I5030 Unspecified diastolic (congestive) heart failure: Secondary | ICD-10-CM | POA: Diagnosis present

## 2019-02-09 DIAGNOSIS — I4891 Unspecified atrial fibrillation: Secondary | ICD-10-CM | POA: Diagnosis not present

## 2019-02-09 DIAGNOSIS — D638 Anemia in other chronic diseases classified elsewhere: Secondary | ICD-10-CM | POA: Diagnosis present

## 2019-02-09 DIAGNOSIS — F0280 Dementia in other diseases classified elsewhere without behavioral disturbance: Secondary | ICD-10-CM | POA: Diagnosis not present

## 2019-02-09 DIAGNOSIS — D72829 Elevated white blood cell count, unspecified: Secondary | ICD-10-CM | POA: Diagnosis not present

## 2019-02-09 DIAGNOSIS — S83282A Other tear of lateral meniscus, current injury, left knee, initial encounter: Secondary | ICD-10-CM | POA: Diagnosis not present

## 2019-02-09 DIAGNOSIS — R4189 Other symptoms and signs involving cognitive functions and awareness: Secondary | ICD-10-CM | POA: Diagnosis present

## 2019-02-09 DIAGNOSIS — S86812A Strain of other muscle(s) and tendon(s) at lower leg level, left leg, initial encounter: Secondary | ICD-10-CM | POA: Diagnosis not present

## 2019-02-09 DIAGNOSIS — S82092A Other fracture of left patella, initial encounter for closed fracture: Secondary | ICD-10-CM | POA: Diagnosis present

## 2019-02-09 DIAGNOSIS — T45515A Adverse effect of anticoagulants, initial encounter: Secondary | ICD-10-CM | POA: Diagnosis present

## 2019-02-09 DIAGNOSIS — S76192A Other specified injury of left quadriceps muscle, fascia and tendon, initial encounter: Secondary | ICD-10-CM | POA: Diagnosis present

## 2019-02-09 DIAGNOSIS — G8929 Other chronic pain: Secondary | ICD-10-CM | POA: Diagnosis present

## 2019-02-09 DIAGNOSIS — R739 Hyperglycemia, unspecified: Secondary | ICD-10-CM | POA: Diagnosis not present

## 2019-02-09 DIAGNOSIS — N400 Enlarged prostate without lower urinary tract symptoms: Secondary | ICD-10-CM | POA: Diagnosis not present

## 2019-02-09 DIAGNOSIS — E785 Hyperlipidemia, unspecified: Secondary | ICD-10-CM | POA: Diagnosis present

## 2019-02-09 DIAGNOSIS — Z79899 Other long term (current) drug therapy: Secondary | ICD-10-CM | POA: Diagnosis not present

## 2019-02-09 DIAGNOSIS — S83272A Complex tear of lateral meniscus, current injury, left knee, initial encounter: Secondary | ICD-10-CM | POA: Diagnosis present

## 2019-02-09 DIAGNOSIS — Z8249 Family history of ischemic heart disease and other diseases of the circulatory system: Secondary | ICD-10-CM | POA: Diagnosis not present

## 2019-02-09 DIAGNOSIS — I482 Chronic atrial fibrillation, unspecified: Secondary | ICD-10-CM | POA: Diagnosis present

## 2019-02-09 DIAGNOSIS — I110 Hypertensive heart disease with heart failure: Secondary | ICD-10-CM | POA: Diagnosis present

## 2019-02-09 DIAGNOSIS — D72828 Other elevated white blood cell count: Secondary | ICD-10-CM | POA: Diagnosis not present

## 2019-02-09 LAB — CBC
HCT: 31.3 % — ABNORMAL LOW (ref 39.0–52.0)
HCT: 31.8 % — ABNORMAL LOW (ref 39.0–52.0)
HCT: 29.1 % — ABNORMAL LOW (ref 39.0–52.0)
Hemoglobin: 10.3 g/dL — ABNORMAL LOW (ref 13.0–17.0)
Hemoglobin: 9.9 g/dL — ABNORMAL LOW (ref 13.0–17.0)
Hemoglobin: 9.5 g/dL — ABNORMAL LOW (ref 13.0–17.0)
MCH: 28.3 pg (ref 26.0–34.0)
MCH: 27.3 pg (ref 26.0–34.0)
MCH: 28.3 pg (ref 26.0–34.0)
MCHC: 32.9 g/dL (ref 30.0–36.0)
MCHC: 31.1 g/dL (ref 30.0–36.0)
MCHC: 32.6 g/dL (ref 30.0–36.0)
MCV: 86 fL (ref 80.0–100.0)
MCV: 87.8 fL (ref 80.0–100.0)
MCV: 86.6 fL (ref 80.0–100.0)
Platelets: 259 10*3/uL (ref 150–400)
Platelets: 268 10*3/uL (ref 150–400)
Platelets: 275 10*3/uL (ref 150–400)
RBC: 3.64 MIL/uL — ABNORMAL LOW (ref 4.22–5.81)
RBC: 3.62 MIL/uL — ABNORMAL LOW (ref 4.22–5.81)
RBC: 3.36 MIL/uL — ABNORMAL LOW (ref 4.22–5.81)
RDW: 14.4 % (ref 11.5–15.5)
RDW: 14.3 % (ref 11.5–15.5)
RDW: 14.4 % (ref 11.5–15.5)
WBC: 15.4 10*3/uL — ABNORMAL HIGH (ref 4.0–10.5)
WBC: 12.1 10*3/uL — ABNORMAL HIGH (ref 4.0–10.5)
WBC: 12.8 10*3/uL — AB (ref 4.0–10.5)
nRBC: 0 % (ref 0.0–0.2)
nRBC: 0 % (ref 0.0–0.2)
nRBC: 0 % (ref 0.0–0.2)

## 2019-02-09 LAB — SYNOVIAL CELL COUNT + DIFF, W/ CRYSTALS
Eosinophils-Synovial: 0 % (ref 0–1)
Lymphocytes-Synovial Fld: 1 % (ref 0–20)
Monocyte-Macrophage-Synovial Fluid: 5 % — ABNORMAL LOW (ref 50–90)
Neutrophil, Synovial: 94 % — ABNORMAL HIGH (ref 0–25)
WBC, Synovial: UNDETERMINED /mm3 (ref 0–200)

## 2019-02-09 LAB — COMPREHENSIVE METABOLIC PANEL
ALBUMIN: 2.1 g/dL — AB (ref 3.5–5.0)
ALT: 18 U/L (ref 0–44)
AST: 17 U/L (ref 15–41)
Alkaline Phosphatase: 77 U/L (ref 38–126)
Anion gap: 9 (ref 5–15)
BUN: 21 mg/dL (ref 8–23)
CO2: 21 mmol/L — ABNORMAL LOW (ref 22–32)
Calcium: 8.4 mg/dL — ABNORMAL LOW (ref 8.9–10.3)
Chloride: 106 mmol/L (ref 98–111)
Creatinine, Ser: 0.87 mg/dL (ref 0.61–1.24)
GFR calc Af Amer: >60 mL/min (ref >60)
GFR calc non Af Amer: >60 mL/min (ref >60)
GLUCOSE: 114 mg/dL — AB (ref 70–99)
Potassium: 3.3 mmol/L — ABNORMAL LOW (ref 3.5–5.1)
Sodium: 136 mmol/L (ref 135–145)
Total Bilirubin: 1.3 mg/dL — ABNORMAL HIGH (ref 0.3–1.2)
Total Protein: 6.4 g/dL — ABNORMAL LOW (ref 6.5–8.1)

## 2019-02-09 LAB — LACTIC ACID, PLASMA
Lactic Acid, Venous: 1.4 mmol/L (ref 0.5–1.9)
Lactic Acid, Venous: 1.3 mmol/L (ref 0.5–1.9)

## 2019-02-09 LAB — PROTIME-INR
INR: 6.2 (ref 0.8–1.2)
PROTHROMBIN TIME: 53.8 s — AB (ref 11.4–15.2)

## 2019-02-09 LAB — URINE CULTURE
Culture: NO GROWTH
Special Requests: NORMAL

## 2019-02-09 LAB — TYPE AND SCREEN
ABO/RH(D): A POS
Antibody Screen: NEGATIVE

## 2019-02-09 LAB — PROCALCITONIN: Procalcitonin: 0.56 ng/mL

## 2019-02-09 MED ORDER — DONEPEZIL HCL 5 MG PO TABS
10.0000 mg | ORAL_TABLET | Freq: Every day | ORAL | Status: DC
  Administered 2019-02-09 – 2019-02-15 (×6): 10 mg via ORAL
  Filled 2019-02-09: qty 1
  Filled 2019-02-09: qty 2
  Filled 2019-02-09: qty 1
  Filled 2019-02-09 (×3): qty 2
  Filled 2019-02-09: qty 1
  Filled 2019-02-09 (×2): qty 2

## 2019-02-09 MED ORDER — METRONIDAZOLE IN NACL 5-0.79 MG/ML-% IV SOLN
500.0000 mg | Freq: Three times a day (TID) | INTRAVENOUS | Status: DC
  Administered 2019-02-09 – 2019-02-11 (×7): 500 mg via INTRAVENOUS
  Filled 2019-02-09 (×7): qty 100

## 2019-02-09 MED ORDER — VANCOMYCIN HCL IN DEXTROSE 1-5 GM/200ML-% IV SOLN: 1000.0000 mg | Freq: Once | INTRAVENOUS | Status: DC

## 2019-02-09 MED ORDER — ONDANSETRON HCL 4 MG/2ML IJ SOLN: 4.0000 mg | Freq: Four times a day (QID) | INTRAMUSCULAR | Status: DC | PRN

## 2019-02-09 MED ORDER — GADOBUTROL 1 MMOL/ML IV SOLN
9.0000 mL | Freq: Once | INTRAVENOUS | Status: AC | PRN
  Administered 2019-02-09: 9 mL via INTRAVENOUS

## 2019-02-09 MED ORDER — OMEGA-3-ACID ETHYL ESTERS 1 G PO CAPS
1.0000 g | ORAL_CAPSULE | Freq: Every day | ORAL | Status: DC
  Administered 2019-02-09 – 2019-02-14 (×6): 1 g via ORAL
  Filled 2019-02-09 (×6): qty 1

## 2019-02-09 MED ORDER — DILTIAZEM HCL ER COATED BEADS 180 MG PO CP24
360.0000 mg | ORAL_CAPSULE | Freq: Every day | ORAL | Status: DC
  Administered 2019-02-09 – 2019-02-15 (×6): 360 mg via ORAL
  Filled 2019-02-09 (×6): qty 2

## 2019-02-09 MED ORDER — ONDANSETRON HCL 4 MG PO TABS: 4.0000 mg | ORAL_TABLET | Freq: Four times a day (QID) | ORAL | Status: DC | PRN

## 2019-02-09 MED ORDER — ACETYLCYSTEINE 600 MG PO CAPS: ORAL_CAPSULE | Freq: Every day | ORAL | Status: DC

## 2019-02-09 MED ORDER — LACTATED RINGERS IV SOLN
INTRAVENOUS | Status: AC
  Administered 2019-02-09 (×2): via INTRAVENOUS

## 2019-02-09 MED ORDER — VITAMIN K1 10 MG/ML IJ SOLN
5.0000 mg | Freq: Once | INTRAVENOUS | Status: AC
  Administered 2019-02-09: 5 mg via INTRAVENOUS
  Filled 2019-02-09 (×2): qty 0.5

## 2019-02-09 MED ORDER — ENSURE ENLIVE PO LIQD
237.0000 mL | Freq: Three times a day (TID) | ORAL | Status: DC
  Administered 2019-02-09 – 2019-02-14 (×12): 237 mL via ORAL

## 2019-02-09 MED ORDER — LIDOCAINE-EPINEPHRINE (PF) 2 %-1:200000 IJ SOLN
20.0000 mL | Freq: Once | INTRAMUSCULAR | Status: AC
  Administered 2019-02-09: 20 mL via INTRADERMAL
  Filled 2019-02-09: qty 20

## 2019-02-09 MED ORDER — LITHIUM CARBONATE 150 MG PO CAPS
150.0000 mg | ORAL_CAPSULE | Freq: Every day | ORAL | Status: DC
  Administered 2019-02-09 – 2019-02-14 (×6): 150 mg via ORAL
  Filled 2019-02-09 (×7): qty 1

## 2019-02-09 MED ORDER — ACETAMINOPHEN 325 MG PO TABS
650.0000 mg | ORAL_TABLET | Freq: Four times a day (QID) | ORAL | Status: DC | PRN
  Administered 2019-02-10: 650 mg via ORAL
  Filled 2019-02-09: qty 2

## 2019-02-09 MED ORDER — VANCOMYCIN HCL IN DEXTROSE 750-5 MG/150ML-% IV SOLN
750.0000 mg | Freq: Two times a day (BID) | INTRAVENOUS | Status: DC
  Administered 2019-02-09 – 2019-02-11 (×5): 750 mg via INTRAVENOUS
  Filled 2019-02-09 (×6): qty 150

## 2019-02-09 MED ORDER — FINASTERIDE 5 MG PO TABS
5.0000 mg | ORAL_TABLET | Freq: Every morning | ORAL | Status: DC
  Administered 2019-02-09 – 2019-02-15 (×6): 5 mg via ORAL
  Filled 2019-02-09 (×6): qty 1

## 2019-02-09 MED ORDER — SODIUM CHLORIDE 0.9 % IV SOLN
2.0000 g | Freq: Once | INTRAVENOUS | Status: AC
  Administered 2019-02-09: 2 g via INTRAVENOUS
  Filled 2019-02-09: qty 2

## 2019-02-09 MED ORDER — LIDOCAINE HCL (PF) 1 % IJ SOLN: 5.0000 mL | Freq: Once | INTRAMUSCULAR | Status: DC

## 2019-02-09 MED ORDER — SODIUM CHLORIDE 0.9 % IV SOLN
1.0000 g | Freq: Three times a day (TID) | INTRAVENOUS | Status: DC
  Administered 2019-02-09 – 2019-02-11 (×6): 1 g via INTRAVENOUS
  Filled 2019-02-09 (×11): qty 1

## 2019-02-09 MED ORDER — POTASSIUM CHLORIDE CRYS ER 20 MEQ PO TBCR
40.0000 meq | EXTENDED_RELEASE_TABLET | Freq: Once | ORAL | Status: AC
  Administered 2019-02-09: 40 meq via ORAL
  Filled 2019-02-09: qty 2

## 2019-02-09 MED ORDER — ACETAMINOPHEN 325 MG PO TABS: 650.0000 mg | ORAL_TABLET | Freq: Four times a day (QID) | ORAL | Status: DC | PRN

## 2019-02-09 MED ORDER — ACETAMINOPHEN 650 MG RE SUPP: 650.0000 mg | Freq: Four times a day (QID) | RECTAL | Status: DC | PRN

## 2019-02-09 NOTE — Evaluation (Signed)
Clinical/Bedside Swallow Evaluation Patient Details  Name: Russell Dillon MRN: [id number] Date of Birth: 08/15/1946  Today's Date: 02/09/2019 Time: SLP Start Time (ACUTE ONLY): 1446 SLP Stop Time (ACUTE ONLY): 1510 SLP Time Calculation (min) (ACUTE ONLY): 24 min  Past Medical History:  Past Medical History:  Diagnosis Date  . Aortic valve regurgitation 10/16/2018   Echo 07/28/2017:  EF 60-65, moderate AI, aortic root and ascending aorta mildly dilated (40 mm), ascending aorta 43 mm, MAC, mild MR, mild LAE, moderate RV enlargement, trivial TR // Echo 12/19: EF 60-65, normal wall motion, mild AI, moderate BAE, ascending aorta 43 mm, aortic root 39 mm  . BPH (benign prostatic hypertrophy)   . Chronic anticoagulation   . Chronic atrial fibrillation   . Diastolic dysfunction October 2010   Normal LV systolic function  . Hx SBO   . Hyperlipidemia   . Hypertension   . Small bowel obstruction [facility])    Past Surgical History:  Past Surgical History:  Procedure Laterality Date  . APPENDECTOMY  2004  . CARDIOVASCULAR STRESS TEST  09/30/2007   EF 67%  No ischemia.   Marland Kitchen CARDIOVERSION  05/01/2004  . SMALL INTESTINE SURGERY  2004  . US ECHOCARDIOGRAPHY  09/05/2009   ef 55-60%   HPI:  Pt is a 73 y.o. male admitted due to concern of sepsis; worsening left knee pain and increased swelling. Swallow eval ordered given hx of dementia and pending surgery.   PMH of dementia, hypertension, hyperlipidemia, atrial fibrillation, bipolar disorder, and small bowel obstruction. 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   Assessment / Plan / Recommendation Clinical Impression  Pt's oropharyngeal swallow appears functional with no overt signs of aspiration. Min cues were provided for redirection to continue self-feeding. Pt appears ready for regular solids and thin liquids. SLP will f/u briefly  for tolerance, particularly after his surgery given h/o dementia.  SLP Visit Diagnosis: Dysphagia, unspecified (R13.10)    Aspiration Risk  Mild aspiration risk    Diet Recommendation Regular;Thin liquid   Liquid Administration via: Cup;Straw Medication Administration: Whole meds with liquid Supervision: Patient able to self feed;Intermittent supervision to cue for compensatory strategies Compensations: Slow rate;Small sips/bites;Minimize environmental distractions Postural Changes: Seated upright at 90 degrees    Other  Recommendations Oral Care Recommendations: Oral care BID   Follow up Recommendations None      Frequency and Duration min 2x/week  1 week       Prognosis Prognosis for Safe Diet Advancement: Good      Swallow Study   General HPI: Pt is a 73 y.o. male admitted due to concern of sepsis; worsening left knee pain and increased swelling. Swallow eval ordered given hx of dementia and pending surgery.   PMH of dementia, hypertension, hyperlipidemia, atrial fibrillation, bipolar disorder, and small bowel obstruction. 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 Type of Study: Bedside Swallow Evaluation Previous Swallow Assessment: none in chart Diet Prior to this Study: NPO Temperature Spikes Noted: Yes Respiratory Status: Room air History of Recent Intubation: No Behavior/Cognition: Alert;Cooperative;Pleasant mood;Distractible Oral Cavity Assessment: Within Functional Limits Oral Care Completed by SLP: No Oral Cavity - Dentition: Adequate natural dentition;Missing dentition Vision: Functional for self-feeding Self-Feeding Abilities: Able to feed self Patient Positioning: Upright in bed Baseline Vocal Quality: Normal Volitional Cough: Strong Volitional  Swallow: Able to elicit    Oral/Motor/Sensory Function Overall Oral Motor/Sensory Function:  Within functional limits   Ice Chips Ice chips: Not tested   Thin Liquid Thin Liquid: Within functional limits Presentation: Cup;Self Fed;Straw    Nectar Thick Nectar Thick Liquid: Not tested   Honey Thick Honey Thick Liquid: Not tested   Puree Puree: Within functional limits Presentation: Self Fed;Spoon   Solid     Solid: Within functional limits Presentation: [place] 02/09/2019,3:29 PM  Pollyann Glen, M.A. [place] Acute Environmental education officer [phone] Office [phone]

## 2019-02-09 NOTE — Progress Notes (Signed)
Orthopedic Tech Progress Note Patient Details:  Russell Dillon [birth year] [id number]  Ortho Devices Type of Ortho Device: Ace wrap, Doran Durand splint, Knee Immobilizer Ortho Device/Splint Interventions: Application   Post Interventions Patient Tolerated: Well Instructions Provided: Care of device   Russell Dillon 02/09/2019, 6:26 PM

## 2019-02-09 NOTE — Progress Notes (Signed)
CRITICAL VALUE ALERT  Critical Value:  INR 6.2 PT 53.6  Date & Time Notied:  02/09/2019 1140  Provider Notified: Dr. Thereasa Solo  Orders Received/Actions taken: IVPB of K+ ordered. See MAR.

## 2019-02-09 NOTE — Progress Notes (Signed)
TEAM 1 - Stepdown/ICU TEAM  Russell Dillon  NAT:[id number] DOB: [birth year] DOA: 02/08/2019 PCP: Marton Redwood, MD    Brief Narrative:  [phone] w/ a hx of cognitive impairment, atrial fibrillation, and bipolar disorder who was found to be increasingly weak with increasing pain in his left knee which progressed to the point he was not able to walk. He has had a chronic left knee problem. He visited his PCP's office where a CBC noted a 4 g drop in hemoglobin. He was advised to come to the ER.    In the ER the patient was mildly confused and had a temperature of 101 F w/ leukocytosis.  On exam patient's left knee was tender to touch.  Chest x-ray was unremarkable.  UA did not show any signs of infection.  MRI of the left knee was done which shows features concerning for osteomyelitis and septic arthritis of the left knee.  Arthrocentesis been done after discussing with on-call orthopedic surgeon Dr. Marcelino Scot who will be seeing patient in consult empiric antibiotics have been started after blood cultures were sent.  Patient's blood pressure was in the low normal initially improved with fluids.   Significant Events: 3/31 admit   Subjective: Pt is seen for a f/u visit.    Assessment & Plan:  SIRS v/s Sepsis of unclear etiology  No evidence of septic arthritis per arthrocentesis - no clear localizing sx at present - cont empiric abx for now   Anemia ?4g drop in Hgb per family (unclear over what time frame) - check stool for occult blood - warfarin on hold w/ elevated INR   Coagulopathy secondary Coumadin Coumadin on hold w/ plan for knee surgery this week - dosed w/ low dose vitamin K x1 today - follow - begin IV heparin once INR < 2.0  Near complete tear/avulsion of patellar tendon off insertion on tibial tuberosity & B meniscal tears  For surgery late this week  A. fib with RVR   Bipolar  Lithium level normal  Cognitive impairment  DVT prophylaxis: warfarin Code Status:  FULL CODE Family Communication:  Disposition Plan:   Consultants:  Ortho  Antimicrobials:  Cefepime 3/30 > Flagyl 3/30 >  Objective: Blood pressure (!) 99/58, pulse 94, temperature 97.9 F (36.6 C), temperature source Oral, resp. rate 16, height 5\' 10"  (1.778 m), weight 89.3 kg, SpO2 99 %.  Intake/Output Summary (Last 24 hours) at 02/09/2019 1408 Last data filed at 02/09/2019 1128 Gross per 24 hour  Intake 3388.39 ml  Output 800 ml  Net 2588.39 ml   Filed Weights   02/08/19 1402 02/09/19 0812  Weight: 93.4 kg 89.3 kg    Examination: Pt was seen for a f/u visit.    CBC: Recent Labs  Lab 02/08/19 1401 02/09/19 0241 02/09/19 0524 02/09/19 0953  WBC 18.4* 15.4* 12.1* 12.8*  NEUTROABS 16.3*  --   --   --   HGB 10.8* 10.3* 9.9* 9.5*  HCT 33.7* 31.3* 31.8* 29.1*  MCV 88.2 86.0 87.8 86.6  PLT 299 259 268 623   Basic Metabolic Panel: Recent Labs  Lab 02/08/19 1401 02/09/19 0524  NA 133* 136  K 3.7 3.3*  CL 99 106  CO2 22 21*  GLUCOSE 138* 114*  BUN 26* 21  CREATININE 1.17 0.87  CALCIUM 8.9 8.4*   GFR: Estimated Creatinine Clearance: 83.8 mL/min (by C-G formula based on SCr of 0.87 mg/dL).  Liver Function Tests: Recent Labs  Lab 02/08/19 1401 02/09/19 0524  AST  26 17  ALT 22 18  ALKPHOS 97 77  BILITOT 1.9* 1.3*  PROT 7.6 6.4*  ALBUMIN 2.7* 2.1*    Coagulation Profile: Recent Labs  Lab 02/08/19 1401 02/09/19 0953  INR 4.3* 6.2*    CBG: Recent Labs  Lab 02/08/19 1410  GLUCAP 118*    Recent Results (from the past 240 hour(s))  Blood culture (routine x 2)     Status: None (Preliminary result)   Collection Time: 02/08/19  2:00 PM  Result Value Ref Range Status   Specimen Description BLOOD LEFT FOREARM  Final   Special Requests   Final    BOTTLES DRAWN AEROBIC AND ANAEROBIC Blood Culture adequate volume   Culture   Final    NO GROWTH < 24 HOURS Performed at [facility], [place] [address], [place], [place] [address]    Report Status  PENDING  Incomplete  Blood culture (routine x 2)     Status: None (Preliminary result)   Collection Time: 02/08/19  4:00 PM  Result Value Ref Range Status   Specimen Description BLOOD RIGHT FOREARM  Final   Special Requests   Final    BOTTLES DRAWN AEROBIC AND ANAEROBIC Blood Culture adequate volume   Culture   Final    NO GROWTH < 24 HOURS Performed at [facility], [place] [address], [place], [place] [address]    Report Status PENDING  Incomplete  Urine culture     Status: None   Collection Time: 02/08/19  6:27 PM  Result Value Ref Range Status   Specimen Description URINE, RANDOM  Final   Special Requests unknown Normal  Final   Culture   Final    NO GROWTH Performed at [facility], [address] [place], [place] [address]    Report Status 02/09/2019 FINAL  Final  Body fluid culture     Status: None (Preliminary result)   Collection Time: 02/09/19  1:43 AM  Result Value Ref Range Status   Specimen Description FLUID SYNOVIAL LEFT KNEE  Final   Special Requests SYRINGE  Final   Gram Stain   Final    ABUNDANT WBC PRESENT, PREDOMINANTLY PMN NO ORGANISMS SEEN Performed at [facility], [place] [address], [place], [place] [address]    Culture PENDING  Incomplete   Report Status PENDING  Incomplete     Scheduled Meds: . donepezil  10 mg Oral Daily  . feeding supplement (ENSURE ENLIVE)  237 mL Oral TID BM  . finasteride  5 mg Oral q morning - 10a  . lithium carbonate  150 mg Oral QHS  . omega-3 acid ethyl esters  1 g Oral QHS   Continuous Infusions: . ceFEPime (MAXIPIME) IV 200 mL/hr at 02/09/19 1128  . lactated ringers 125 mL/hr at 02/09/19 1128  . metronidazole Stopped (02/09/19 0924)  . phytonadione (VITAMIN K) IV    . vancomycin Stopped (02/09/19 0636)     LOS: 0 days   Time spent: No Charge  Cherene Altes, MD [facility] Office  [phone] Pager - Text Page per Amion as per below:  On-Call/Text Page:      [email or web address]  If  7PM-7AM, please contact night-coverage [email or web address] 02/09/2019, 2:08 PM

## 2019-02-09 NOTE — ED Notes (Signed)
Breakfast Tray Ordered. 

## 2019-02-09 NOTE — ED Provider Notes (Signed)
Received call from radiology the patient's knee MRI is concerning for possible osteomyelitis of proximal tibia and femoral condyle as well as septic arthritis.  Discussed with Dr. Marcelino Scot of orthopedics who agrees with arthrocentesis despite elevated INR of 4.3. no special precautions needed with regard to osteomyelitis.  He will consult on patient.  Patient already received broad-spectrum antibiotics. Arthrocentesis to be performed by [facility].  Discussed with Dr. Hal Hope.  CRITICAL CARE Performed by: Ezequiel Essex Total critical care time: 31 minutes Critical care time was exclusive of separately billable procedures and treating other patients. Critical care was necessary to treat or prevent imminent or life-threatening deterioration. Critical care was time spent personally by me on the following activities: development of treatment plan with patient and/or surrogate as well as nursing, discussions with consultants, evaluation of patient's response to treatment, examination of patient, obtaining history from patient or surrogate, ordering and performing treatments and interventions, ordering and review of laboratory studies, ordering and review of radiographic studies, pulse oximetry and re-evaluation of patient's condition.    Ezequiel Essex, MD 02/09/19 [phone]

## 2019-02-09 NOTE — ED Notes (Signed)
ED TO INPATIENT HANDOFF REPORT  ED Nurse Name and Phone #: [phone]  S Name/Age/Gender Russell Dillon 73 y.o. male Room/Bed: 034C/034C  Code Status   Code Status: Prior  Home/SNF/Other Home Patient oriented to: self Is this baseline? Yes   Triage Complete: Triage complete  Chief Complaint meds  Triage Note Pt from [facility] for further evaluation of bilateral leg pain, elevated white count, fever, increased weakness, and altered mental status; hx dementia   Allergies No Known Allergies  Level of Care/Admitting Diagnosis ED Disposition    ED Disposition Condition [facility] Area: [place] [[id number]]  Level of Care: Telemetry Medical [104]  Diagnosis: SIRS (systemic inflammatory response syndrome) [facility]) [[id number]]  Admitting Physician: Rise Patience [phone]  Attending Physician: Rise Patience [phone]  Estimated length of stay: past midnight tomorrow  Certification:: I certify this patient will need inpatient services for at least 2 midnights  PT Class (Do Not Modify): Inpatient [101]  PT Acc Code (Do Not Modify): Private [1]       B Medical/Surgery History Past Medical History:  Diagnosis Date  . Aortic valve regurgitation 10/16/2018   Echo 07/28/2017:  EF 60-65, moderate AI, aortic root and ascending aorta mildly dilated (40 mm), ascending aorta 43 mm, MAC, mild MR, mild LAE, moderate RV enlargement, trivial TR // Echo 12/19: EF 60-65, normal wall motion, mild AI, moderate BAE, ascending aorta 43 mm, aortic root 39 mm  . BPH (benign prostatic hypertrophy)   . Chronic anticoagulation   . Chronic atrial fibrillation   . Diastolic dysfunction October 2010   Normal LV systolic function  . Hx SBO   . Hyperlipidemia   . Hypertension   . Small bowel obstruction [facility])    Past Surgical History:  Procedure Laterality Date  . APPENDECTOMY  2004  . CARDIOVASCULAR STRESS TEST  09/30/2007   EF 67%  No  ischemia.   Marland Kitchen CARDIOVERSION  05/01/2004  . SMALL INTESTINE SURGERY  2004  . US ECHOCARDIOGRAPHY  09/05/2009   ef 55-60%     A IV Location/Drains/Wounds Patient Lines/Drains/Airways Status   Active Line/Drains/Airways    Name:   Placement date:   Placement time:   Site:   Days:   Peripheral IV 02/08/19 Right Forearm   02/08/19    1355    Forearm   1          Intake/Output Last 24 hours  Intake/Output Summary (Last 24 hours) at 02/09/2019 0227 Last data filed at 02/08/2019 2137 Gross per 24 hour  Intake 2310.93 ml  Output -  Net 2310.93 ml    Labs/Imaging Results for orders placed or performed during the hospital encounter of 02/08/19 (from the past 48 hour(s))  Comprehensive metabolic panel     Status: Abnormal   Collection Time: 02/08/19  2:01 PM  Result Value Ref Range   Sodium 133 (L) 135 - 145 mmol/L   Potassium 3.7 3.5 - 5.1 mmol/L   Chloride 99 98 - 111 mmol/L   CO2 22 22 - 32 mmol/L   Glucose, Bld 138 (H) 70 - 99 mg/dL   BUN 26 (H) 8 - 23 mg/dL   Creatinine, Ser 1.17 0.61 - 1.24 mg/dL   Calcium 8.9 8.9 - 10.3 mg/dL   Total Protein 7.6 6.5 - 8.1 g/dL   Albumin 2.7 (L) 3.5 - 5.0 g/dL   AST 26 15 - 41 U/L   ALT 22 0 - 44 U/L  Alkaline Phosphatase 97 38 - 126 U/L   Total Bilirubin 1.9 (H) 0.3 - 1.2 mg/dL   GFR calc non Af Amer >60 >60 mL/min   GFR calc Af Amer >60 >60 mL/min   Anion gap 12 5 - 15    Comment: Performed at [facility] [address], [place], [place] [address]  CBC WITH DIFFERENTIAL     Status: Abnormal   Collection Time: 02/08/19  2:01 PM  Result Value Ref Range   WBC 18.4 (H) 4.0 - 10.5 K/uL   RBC 3.82 (L) 4.22 - 5.81 MIL/uL   Hemoglobin 10.8 (L) 13.0 - 17.0 g/dL   HCT 33.7 (L) 39.0 - 52.0 %   MCV 88.2 80.0 - 100.0 fL   MCH 28.3 26.0 - 34.0 pg   MCHC 32.0 30.0 - 36.0 g/dL   RDW 14.4 11.5 - 15.5 %   Platelets 299 150 - 400 K/uL   nRBC 0.0 0.0 - 0.2 %   Neutrophils Relative % 88 %   Neutro Abs 16.3 (H) 1.7 - 7.7 K/uL    Lymphocytes Relative 4 %   Lymphs Abs 0.8 0.7 - 4.0 K/uL   Monocytes Relative 7 %   Monocytes Absolute 1.3 (H) 0.1 - 1.0 K/uL   Eosinophils Relative 0 %   Eosinophils Absolute 0.0 0.0 - 0.5 K/uL   Basophils Relative 0 %   Basophils Absolute 0.0 0.0 - 0.1 K/uL   Immature Granulocytes 1 %   Abs Immature Granulocytes 0.09 (H) 0.00 - 0.07 K/uL    Comment: Performed at [facility], [address], [place], [place] [address]  Protime-INR     Status: Abnormal   Collection Time: 02/08/19  2:01 PM  Result Value Ref Range   Prothrombin Time 40.7 (H) 11.4 - 15.2 seconds   INR 4.3 (HH) 0.8 - 1.2    Comment: REPEATED TO VERIFY CRITICAL RESULT CALLED TO, READ BACK BY AND VERIFIED WITH: K.MOON RN 1456 02/08/2019 MCCORMICK K (NOTE) INR goal varies based on device and disease states. Performed at [facility], [place] [address] [place], [place] [address]   Lithium level     Status: Abnormal   Collection Time: 02/08/19  2:01 PM  Result Value Ref Range   Lithium Lvl 0.14 (L) 0.60 - 1.20 mmol/L    Comment: Performed at [facility] [address], [place], [place] [address]  Brain natriuretic peptide     Status: Abnormal   Collection Time: 02/08/19  2:01 PM  Result Value Ref Range   B Natriuretic Peptide 220.8 (H) 0.0 - 100.0 pg/mL    Comment: Performed at [facility] [address], [place], [place] [address]  CBG monitoring, ED     Status: Abnormal   Collection Time: 02/08/19  2:10 PM  Result Value Ref Range   Glucose-Capillary 118 (H) 70 - 99 mg/dL  Lactic acid, plasma     Status: None   Collection Time: 02/08/19  4:55 PM  Result Value Ref Range   Lactic Acid, Venous 1.8 0.5 - 1.9 mmol/L    Comment: Performed at [facility] [address] [place], [place] [address]  Urinalysis, Complete w Microscopic     Status: Abnormal   Collection Time: 02/08/19  6:20 PM  Result Value Ref Range   Color, Urine YELLOW YELLOW   APPearance CLEAR CLEAR   Specific  Gravity, Urine 1.029 1.005 - 1.030   pH 5.0 5.0 -  8.0   Glucose, UA NEGATIVE NEGATIVE mg/dL   Hgb urine dipstick LARGE (A) NEGATIVE   Bilirubin Urine NEGATIVE NEGATIVE   Ketones, ur NEGATIVE NEGATIVE mg/dL   Protein, ur 30 (A) NEGATIVE mg/dL   Nitrite NEGATIVE NEGATIVE   Leukocytes,Ua NEGATIVE NEGATIVE   RBC / HPF >50 (H) 0 - 5 RBC/hpf   WBC, UA 0-5 0 - 5 WBC/hpf   Bacteria, UA RARE (A) NONE SEEN   Squamous Epithelial / LPF 0-5 0 - 5   Mucus PRESENT     Comment: Performed at [facility], [place] [address], [place], [place] [address]   Ct Head Wo Contrast  Result Date: 02/08/2019 CLINICAL DATA:  Altered level of consciousness EXAM: CT HEAD WITHOUT CONTRAST TECHNIQUE: Contiguous axial images were obtained from the base of the skull through the vertex without intravenous contrast. COMPARISON:  None. FINDINGS: Brain: No evidence of acute infarction, hemorrhage, extra-axial collection, ventriculomegaly, or mass effect. Generalized cerebral atrophy. Periventricular white matter low attenuation likely secondary to microangiopathy. Vascular: Cerebrovascular atherosclerotic calcifications are noted. Skull: Negative for fracture or focal lesion. Sinuses/Orbits: Visualized portions of the orbits are unremarkable. Visualized portions of the paranasal sinuses are unremarkable. Visualized portions of the mastoid air cells are unremarkable. Other: None. IMPRESSION: 1. No acute intracranial pathology. 2. Chronic microvascular disease and cerebral atrophy. Electronically Signed   By: Kathreen Devoid   On: 02/08/2019 16:18   Ct Abdomen Pelvis W Contrast  Result Date: 02/08/2019 CLINICAL DATA:  Abdominal pain EXAM: CT ABDOMEN AND PELVIS WITH CONTRAST TECHNIQUE: Multidetector CT imaging of the abdomen and pelvis was performed using the standard protocol following bolus administration of intravenous contrast. CONTRAST:  187m OMNIPAQUE IOHEXOL 300 MG/ML  SOLN COMPARISON:  07/08/2013 FINDINGS: LOWER CHEST: Enlarged  left atrium. No pleural effusion. Right basilar atelectasis. HEPATOBILIARY: The hepatic contours and density are normal. There is no intra- or extrahepatic biliary dilatation. There is cholelithiasis without acute inflammation. Focal fatty deposition along the gallbladder fossa. PANCREAS: The pancreatic parenchymal contours are normal and there is no ductal dilatation. There is no peripancreatic fluid collection. SPLEEN: Normal. ADRENALS/URINARY TRACT: --Adrenal glands: Normal. --Right kidney/ureter: No hydronephrosis, nephroureterolithiasis, perinephric stranding or solid renal mass. --Left kidney/ureter: No hydronephrosis, nephroureterolithiasis, perinephric stranding or solid renal mass. --Urinary bladder: Normal for degree of distention STOMACH/BOWEL: --Stomach/Duodenum: There is no hiatal hernia or other gastric abnormality. The duodenal course and caliber are normal. --Small bowel: No dilatation or inflammation. --Colon: No focal abnormality. --Appendix: Not visualized. No right lower quadrant inflammation or free fluid. VASCULAR/LYMPHATIC: There is aortic atherosclerosis without hemodynamically significant stenosis. The portal vein, splenic vein, superior mesenteric vein and IVC are patent. No abdominal or pelvic lymphadenopathy. REPRODUCTIVE: Enlarged prostate measures 7.8 cm in transverse dimension. MUSCULOSKELETAL. 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 OTHER: None. IMPRESSION: 1. No acute abnormality of the abdomen or pelvis. 2. Cholelithiasis without acute inflammation. 3. Markedly enlarged prostate. 4. 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 Electronically Signed   By: KUlyses JarredM.D.   On: 02/08/2019 16:58   Dg Chest Port 1 View  Result Date: 02/08/2019 CLINICAL DATA:  Fever, altered mental status, leukocytosis. EXAM: PORTABLE CHEST 1 VIEW COMPARISON:  Chest x-ray dated July 10, 2015.  FINDINGS: The heart size and mediastinal contours are within normal limits. Atherosclerotic calcification of the aortic arch. Normal pulmonary vascularity. No focal consolidation, pleural effusion, or pneumothorax. No acute osseous abnormality. IMPRESSION: No active disease.  Electronically Signed   By: Titus Dubin M.D.   On: 02/08/2019 14:23   Dg Knee Complete 4 Views Left  Result Date: 02/08/2019 CLINICAL DATA:  Pain in both legs.  Elevated white count and fever. EXAM: LEFT KNEE - COMPLETE 4+ VIEW COMPARISON:  12/20/2009 FINDINGS: Small suprapatellar joint effusion identified. There is anterior soft tissue swelling with edema subcutaneous anterior to the knee. Interval avulsion of the tibial tuberosity with possible osteolysis involving the undersurface of the lower pole of patella and tibial tubercle. Mild tricompartment osteoarthritis and chondrocalcinosis noted. IMPRESSION: 1. There has been interval avulsion of the tibial tubercle with suspected osteolysis involving the lower pole of patella and tibial tubercle. Overlying soft tissue swelling and small joint effusion is noted. Cannot rule out underlying osteomyelitis. Recommend further evaluation with contrast enhanced MR of the knee. Electronically Signed   By: Kerby Moors M.D.   On: 02/08/2019 19:53    Pending Labs Unresulted Labs (From admission, onward)    Start     Ordered   02/09/19 0107  Body fluid culture  (Arthrocentesis Panel)  ONCE - STAT,   STAT    Question Answer Comment  Are there also cytology or pathology orders on this specimen? No   Patient immune status Normal      02/09/19 0107   02/09/19 0107  Glucose, Body Fluid Other  (Arthrocentesis Panel)  ONCE - STAT,   STAT     02/09/19 0107   02/09/19 0107  Protein, body fluid (other)  (Arthrocentesis Panel)  ONCE - STAT,   STAT     02/09/19 0107   02/09/19 0107  Synovial cell count + diff, w/ crystals  (Arthrocentesis Panel)  ONCE - STAT,   STAT     02/09/19 0107    02/08/19 1601  Blood culture (routine x 2)  BLOOD CULTURE X 2,   STAT     02/08/19 1600   02/08/19 1401  Urine culture  ONCE - STAT,   STAT    Question Answer Comment  List patient's active antibiotics unknown   Patient immune status Normal      02/08/19 1400          Vitals/Pain Today's Vitals   02/09/19 0036 02/09/19 0130 02/09/19 0200 02/09/19 0205  BP: 112/66 1_0  Pulse: (!) 104   97  Resp: 14 (!) _1 Temp:      TempSrc:      SpO2: 99%   99%  Weight:      Height:      PainSc: 0-No pain       Isolation Precautions No active isolations  Medications Medications  sodium chloride 0.9 % bolus 1,000 mL (0 mLs Intravenous Stopped 02/08/19 1554)    And  0.9 %  sodium chloride infusion ( Intravenous Stopped 02/08/19 2016)  sodium chloride 0.9 % bolus 250 mL (0 mLs Intravenous Stopped 02/08/19 2137)    Followed by  0.9 %  sodium chloride infusion (1,000 mLs Intravenous Not Given 02/08/19 1915)  acetaminophen (TYLENOL) tablet 1,000 mg (1,000 mg Oral Given 02/08/19 1742)  vancomycin (VANCOCIN) IVPB 1000 mg/200 mL premix (0 mg Intravenous Stopped 02/08/19 1914)  piperacillin-tazobactam (ZOSYN) IVPB 3.375 g (0 g Intravenous Stopped 02/08/19 1856)  iohexol (OMNIPAQUE) 300 MG/ML solution 100 mL (100 mLs Intravenous Contrast Given 02/08/19 1640)  gadobutrol (GADAVIST) 1 MMOL/ML injection 9 mL (9 mLs Intravenous Contrast Given 02/09/19 0023)  lidocaine-EPINEPHrine (XYLOCAINE W/EPI) 2 %-1:200000 (PF) injection 20 mL (20 mLs  Intradermal Given 02/09/19 0146)    Mobility non-ambulatory     Focused Assessments Cardiac Assessment Handoff:  Cardiac Rhythm: Atrial fibrillation No results found for: CKTOTAL, CKMB, CKMBINDEX, TROPONINI No results found for: DDIMER Does the Patient currently have chest pain? No      R Recommendations: See Admitting Provider Note  Report given to:   Additional Notes:  Patient lives at home with wife; arthrocentesis completed on left  knee.

## 2019-02-09 NOTE — Progress Notes (Signed)
Pt received from ED. VSS. CHG complete. Telemetry applied. HR 100-110's. PT oriented to room and unit. Will continue to monitor.  Clyde Canterbury, RN

## 2019-02-09 NOTE — Progress Notes (Signed)
Pharmacy Antibiotic Note  Russell Dillon is a 73 y.o. male admitted on 02/08/2019 with AMS, possible sepsis.  Pharmacy has been consulted for Vancomycin and Cefepime dosing.  Vancomycin 1 g IV given in ED at 6 pm  Plan: Vancomycin 750 mg IV q12h Cefepime 1 g IV q8h  Height: 5' 10.47" (179 cm) Weight: 206 lb (93.4 kg) IBW/kg (Calculated) : 74.09  Temp (24hrs), Avg:100 F (37.8 C), Min:98.7 F (37.1 C), Max:101.2 F (38.4 C)  Recent Labs  Lab 02/08/19 1401 02/08/19 1655  WBC 18.4*  --   CREATININE 1.17  --   LATICACIDVEN  --  1.8    Estimated Creatinine Clearance: 64.1 mL/min (by C-G formula based on SCr of 1.17 mg/dL).    No Known Allergies   Russell Dillon 02/09/2019 2:32 AM

## 2019-02-09 NOTE — ED Provider Notes (Signed)
   ED Course/Procedures     .Joint Aspiration/Arthrocentesis Date/Time: 02/09/2019 1:52 AM Performed by: Montine Circle, PA-C Authorized by: Ezequiel Essex, MD   Consent:    Consent obtained:  Verbal   Consent given by: implied due to patient confusion and unable to reache spouse.   Risks discussed:  Bleeding and pain Location:    Location:  Knee   Knee:  L knee Anesthesia (see MAR for exact dosages):    Anesthesia method:  Local infiltration   Local anesthetic:  Lidocaine 1% WITH epi Procedure details:    Preparation: Patient was prepped and draped in usual sterile fashion     Needle gauge:  18 G   Ultrasound guidance: no     Approach:  Lateral   Aspirate amount:  10   Aspirate characteristics:  Blood-tinged   Steroid injected: no     Specimen collected: yes   Post-procedure details:    Dressing:  Adhesive bandage   Patient tolerance of procedure:  Tolerated well, no immediate complications        Montine Circle, PA-C 02/09/19 0155    Ezequiel Essex, MD 02/09/19 [phone]

## 2019-02-09 NOTE — TOC Initial Note (Signed)
Transition of Care [facility]) - Initial/Assessment Note    Patient Details  Name: Russell Dillon MRN: [id number] Date of Birth: 1946/03/24  Transition of Care [facility]) CM/SW Contact:    Vinie Sill, [place] Phone Number: 02/09/2019, 12:59 PM  Clinical Narrative:                 Patient is from home with his wife-CSW spoke with the patient's spouse by phone. She states they live alone in the home.They have stairs in the home but they remain on the first level. Patient's wife expressed she wants the patient Home verses SNF if recommended. Patient's wife inquired about [place] agencies, CSW reviewed [facility] listing verbally. CSW explained; CSW will continue to follow and assist with discharge planning.    Per spouse, patient's PCP is Marton Redwood at [facility].   Expected Discharge Plan: [place] Barriers to Discharge: Continued Medical Work up   Patient Goals and CMS Choice   CMS Medicare.gov Compare Post Acute Care list provided to:: Patient Represenative (must comment)(verbally over the phone) Choice offered to / list presented to : Patient  Expected Discharge Plan and Services Expected Discharge Plan: [place]       Living arrangements for the past 2 months: Single Family Home                          Prior Living Arrangements/Services Living arrangements for the past 2 months: Single Family Home Lives with:: Self, Spouse Patient language and need for interpreter reviewed:: No Do you feel safe going back to the place where you live?: Yes      Need for Family Participation in Patient Care: Yes (Comment) Care giver support system in place?: Yes (comment)   Criminal Activity/Legal Involvement Pertinent to Current Situation/Hospitalization: No - Comment as needed  Activities of Daily Living      Permission Sought/Granted Permission sought to share information with : Facility Sport and exercise psychologist, Family Supports, Case  Optician, dispensing granted to share information with : Yes, Verbal Permission Granted  Share Information with NAME: Russell Dillon   Permission granted to share info w AGENCY: [place] agencies   Permission granted to share info w Relationship: spouse  Permission granted to share info w Contact Information: [phone]  Emotional Assessment Appearance:: Other (Comment Required(unable to assess ) Attitude/Demeanor/Rapport: Unable to Assess Affect (typically observed): Unable to Assess Orientation: : Oriented to Self Alcohol / Substance Use: Not Applicable Psych Involvement: No (comment)  Admission diagnosis:  Fever, unspecified fever cause [R50.9] Sepsis without acute organ dysfunction, due to unspecified organism ([place]) [A41.9] SIRS (systemic inflammatory response syndrome) ([place]) [R65.10] Patient Active Problem List   Diagnosis Date Noted  . SIRS (systemic inflammatory response syndrome) ([place]) 02/09/2019  . Osteomyelitis ([place]) 02/09/2019  . Arthritis, septic, knee ([place]) 02/09/2019  . Acute blood loss anemia 02/09/2019  . Aortic valve regurgitation 10/16/2018  . Encounter for therapeutic drug monitoring 12/02/2013  . Small bowel obstruction ([place]) 07/08/2013  . Hyperlipidemia 12/20/2011  . HTN (hypertension) 06/04/2011  . Atrial fibrillation, chronic 03/01/2011  . Long term (current) use of anticoagulants 03/01/2011  . GERD 10/11/2009  . DIVERTICULOSIS OF COLON 10/11/2009   PCP:  Marton Redwood, MD Pharmacy:   [facility], [address] [place] [address] Phone: [phone] Fax: [phone]  [id number] [place], [address]  [place] [address] Phone: [phone] Fax: [phone]     Social Determinants of Health (SDOH) Interventions    Readmission Risk Interventions No flowsheet data found.

## 2019-02-09 NOTE — Consult Note (Signed)
Orthopaedic Trauma Service (OTS) Consult   Patient ID: Russell Dillon MRN: [id number] DOB/AGE: 1946-07-21 73 y.o.   Reason for Consult: septic L knee  Referring Physician:  Carmin Muskrat, MD (EDP)   HPI: Russell Dillon is an 73 y.o. white male who presented to the emergency room yesterday by way of his primary care physician due to hemoglobin drop and increasing left knee pain/inability to bear weight.  Patient also initially had a fever and elevated white count on presentation.  Patient was noted to have large left knee effusion with significant tenderness on evaluation.  Initial x-ray of the left knee demonstrated an effusion as well along with some concerning findings along the tibial tuberosity as such an MRI was obtained and was initially read as potential for osteomyelitis.  Arthrocentesis was performed in the emergency department and the patient was admitted to the medical service.  Orthopedics was consulted for further evaluation and treatment.  Patient was initially prepped for surgical intervention to include irrigation debridement of his left knee however upon independent review of the MRI as well as synovial fluid analysis it was felt that his condition was due to several processes none of which appear to be infectious in etiology.  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  Additionally patient was also noted to have an avulsion type fracture of the insertion of his patellar tendon on the left knee along with other intra-articular findings including bilateral meniscal tears, lateral distal femur and lateral tibial plateau contusion/nondisplaced fractures and tricompartmental arthritis.  MRI findings were discussed with musculoskeletal radiologist as well who confirmed.  Also of note patients INR was 4.3 on admission and this increased to 6.2.  Urinalysis shows large hemoglobin in addition to rare bacteria urine  microscopy Urine culture and blood cultures are pending Chest x-ray does not show any acute cardiopulmonary process CT of the abdomen and pelvis shows some cholelithiasis but no acute inflammation and an enlarged prostate.  No acute issues of note No organisms seen on Gram stain of synovial fluid analysis  Empiric antibiotics started in the emergency department  Based off of these findings surgery was postponed.  Patient will ultimately need repair of his patella tendon avulsion fracture and possibly address his meniscal injuries as well.  Due to his elevated INR surgery will need to be delayed for several days  Patient seen and evaluated 4 E. unit.  He is awake and pleasant but he is confused.  Somewhat difficult to ascertain historical information.  Patient denies any trauma to his left knee.  Denies any recent falls. States that he lives with his wife in a house.  Does not use a walker or cane at baseline.  I did communicate with the patient's wife to verify information.  Patient does live at home with his wife.  She has noticed some mental decline over the last several months.  He does not use any assistive devices for ambulation She has noticed patient is quite forgetful and had increasing confusion She does acknowledge that patient states that he hit his left knee at church about 2 weeks ago but this was unwitnessed by the wife.  Possible that he could have fallen Wife does state that he does have a history of gout.  Patient used to weigh over 300 pounds but had significant cardiopulmonary issues including CHF that was substantial.  This incident scared him enough to make significant lifestyle changes which she  has pursued since 2006.  Patients wife states that his last gout attack was probably 6 years ago or so and usually it is located in his elbows She does also acknowledge that he does have chronic left knee pain  Wife also acknowledges that patient will likely have difficult time  following a medical recommendation due to his decline in mental capacity.     Past Medical History:  Diagnosis Date   Aortic valve regurgitation 10/16/2018   Echo 07/28/2017:  EF 60-65, moderate AI, aortic root and ascending aorta mildly dilated (40 mm), ascending aorta 43 mm, MAC, mild MR, mild LAE, moderate RV enlargement, trivial TR // Echo 12/19: EF 60-65, normal wall motion, mild AI, moderate BAE, ascending aorta 43 mm, aortic root 39 mm   BPH (benign prostatic hypertrophy)    Chronic anticoagulation    Chronic atrial fibrillation    Diastolic dysfunction October 2010   Normal LV systolic function   Hx SBO    Hyperlipidemia    Hypertension    Small bowel obstruction [facility])     Past Surgical History:  Procedure Laterality Date   APPENDECTOMY  2004   CARDIOVASCULAR STRESS TEST  09/30/2007   EF 67%  No ischemia.    CARDIOVERSION  05/01/2004   SMALL INTESTINE SURGERY  2004   US ECHOCARDIOGRAPHY  09/05/2009   ef 55-60%    Family History  Problem Relation Age of Onset   Heart attack Mother    Hypertension Mother    Diabetes Father    Heart disease Father    Colon cancer Neg Hx    Esophageal cancer Neg Hx    Pancreatic cancer Neg Hx    Prostate cancer Neg Hx    Rectal cancer Neg Hx    Stomach cancer Neg Hx     Social History:  reports that he has never smoked. He has never used smokeless tobacco. He reports that he does not drink alcohol or use drugs.  Allergies: No Known Allergies  Medications: I have reviewed the patient's current medications. Current Meds  Medication Sig   Acetylcysteine (NAC 600 PO) Take 1 tablet by mouth at bedtime.   Cholecalciferol (VITAMIN D3 PO) Take 1 tablet by mouth 2 (two) times daily.   diltiazem (CARDIZEM CD) 360 MG 24 hr capsule Take 1 capsule (360 mg total) by mouth daily.   donepezil (ARICEPT) 10 MG tablet Take 1 tablet (10 mg total) by mouth daily.   finasteride (PROSCAR) 5 MG tablet Take 5 mg by mouth  every morning.   lithium carbonate 150 MG capsule Take 1 capsule (150 mg total) by mouth at bedtime.   Multiple Vitamins-Minerals (ICAPS AREDS 2 PO) Take 1 tablet by mouth daily.   Omega-3 Fatty Acids (OMEGA-3 FISH OIL PO) Take 1 tablet by mouth at bedtime.   warfarin (COUMADIN) 5 MG tablet TAKE AS DIRECTED BY COUMADIN CLINIC (Patient taking differently: Take 2.5-5 mg by mouth See admin instructions. Take 1 tablet by mouth on Monday- Saturday  Take 1/2 tablet by mouth on Sunday)     Results for orders placed or performed during the hospital encounter of 02/08/19 (from the past 48 hour(s))  Blood culture (routine x 2)     Status: None (Preliminary result)   Collection Time: 02/08/19  2:00 PM  Result Value Ref Range   Specimen Description BLOOD LEFT FOREARM    Special Requests      BOTTLES DRAWN AEROBIC AND ANAEROBIC Blood Culture adequate volume   Culture  NO GROWTH < 24 HOURS Performed at [facility] [address] [place], [place] [address]    Report Status PENDING   Comprehensive metabolic panel     Status: Abnormal   Collection Time: 02/08/19  2:01 PM  Result Value Ref Range   Sodium 133 (L) 135 - 145 mmol/L   Potassium 3.7 3.5 - 5.1 mmol/L   Chloride 99 98 - 111 mmol/L   CO2 22 22 - 32 mmol/L   Glucose, Bld 138 (H) 70 - 99 mg/dL   BUN 26 (H) 8 - 23 mg/dL   Creatinine, Ser 1.17 0.61 - 1.24 mg/dL   Calcium 8.9 8.9 - 10.3 mg/dL   Total Protein 7.6 6.5 - 8.1 g/dL   Albumin 2.7 (L) 3.5 - 5.0 g/dL   AST 26 15 - 41 U/L   ALT 22 0 - 44 U/L   Alkaline Phosphatase 97 38 - 126 U/L   Total Bilirubin 1.9 (H) 0.3 - 1.2 mg/dL   GFR calc non Af Amer >60 >60 mL/min   GFR calc Af Amer >60 >60 mL/min   Anion gap 12 5 - 15    Comment: Performed at [facility] [address] [place], [place] [address]  CBC WITH DIFFERENTIAL     Status: Abnormal   Collection Time: 02/08/19  2:01 PM  Result Value Ref Range   WBC 18.4 (H) 4.0 - 10.5 K/uL   RBC 3.82 (L) 4.22 - 5.81  MIL/uL   Hemoglobin 10.8 (L) 13.0 - 17.0 g/dL   HCT 33.7 (L) 39.0 - 52.0 %   MCV 88.2 80.0 - 100.0 fL   MCH 28.3 26.0 - 34.0 pg   MCHC 32.0 30.0 - 36.0 g/dL   RDW 14.4 11.5 - 15.5 %   Platelets 299 150 - 400 K/uL   nRBC 0.0 0.0 - 0.2 %   Neutrophils Relative % 88 %   Neutro Abs 16.3 (H) 1.7 - 7.7 K/uL   Lymphocytes Relative 4 %   Lymphs Abs 0.8 0.7 - 4.0 K/uL   Monocytes Relative 7 %   Monocytes Absolute 1.3 (H) 0.1 - 1.0 K/uL   Eosinophils Relative 0 %   Eosinophils Absolute 0.0 0.0 - 0.5 K/uL   Basophils Relative 0 %   Basophils Absolute 0.0 0.0 - 0.1 K/uL   Immature Granulocytes 1 %   Abs Immature Granulocytes 0.09 (H) 0.00 - 0.07 K/uL    Comment: Performed at [facility], [address] [place], [place] [address]  Protime-INR     Status: Abnormal   Collection Time: 02/08/19  2:01 PM  Result Value Ref Range   Prothrombin Time 40.7 (H) 11.4 - 15.2 seconds   INR 4.3 (HH) 0.8 - 1.2    Comment: REPEATED TO VERIFY CRITICAL RESULT CALLED TO, READ BACK BY AND VERIFIED WITH: K.MOON RN [id number] 02/08/2019 MCCORMICK K (NOTE) INR goal varies based on device and disease states. Performed at [facility], [place] [address] [place], [place] [address]   Lithium level     Status: Abnormal   Collection Time: 02/08/19  2:01 PM  Result Value Ref Range   Lithium Lvl 0.14 (L) 0.60 - 1.20 mmol/L    Comment: Performed at [facility] [address], [place], [place] [address]  Brain natriuretic peptide     Status: Abnormal   Collection Time: 02/08/19  2:01 PM  Result Value Ref Range   B Natriuretic Peptide 220.8 (H) 0.0 -  100.0 pg/mL    Comment: Performed at [facility], [place] [address] [place], [place] [address]  CBG monitoring, ED     Status: Abnormal   Collection Time: 02/08/19  2:10 PM  Result Value Ref Range   Glucose-Capillary 118 (H) 70 - 99 mg/dL  Blood culture (routine x 2)     Status: None (Preliminary result)   Collection Time: 02/08/19  4:00 PM  Result  Value Ref Range   Specimen Description BLOOD RIGHT FOREARM    Special Requests      BOTTLES DRAWN AEROBIC AND ANAEROBIC Blood Culture adequate volume   Culture      NO GROWTH < 24 HOURS Performed at [facility], [place] [address] [place], [place] [address]    Report Status PENDING   Lactic acid, plasma     Status: None   Collection Time: 02/08/19  4:55 PM  Result Value Ref Range   Lactic Acid, Venous 1.8 0.5 - 1.9 mmol/L    Comment: Performed at [facility] [address] [place], [place] [address]  Urinalysis, Complete w Microscopic     Status: Abnormal   Collection Time: 02/08/19  6:20 PM  Result Value Ref Range   Color, Urine YELLOW YELLOW   APPearance CLEAR CLEAR   Specific Gravity, Urine 1.029 1.005 - 1.030   pH 5.0 5.0 - 8.0   Glucose, UA NEGATIVE NEGATIVE mg/dL   Hgb urine dipstick LARGE (A) NEGATIVE   Bilirubin Urine NEGATIVE NEGATIVE   Ketones, ur NEGATIVE NEGATIVE mg/dL   Protein, ur 30 (A) NEGATIVE mg/dL   Nitrite NEGATIVE NEGATIVE   Leukocytes,Ua NEGATIVE NEGATIVE   RBC / HPF >50 (H) 0 - 5 RBC/hpf   WBC, UA 0-5 0 - 5 WBC/hpf   Bacteria, UA RARE (A) NONE SEEN   Squamous Epithelial / LPF 0-5 0 - 5   Mucus PRESENT     Comment: Performed at [facility], [address] [place], [place] [address]  Urine culture     Status: None   Collection Time: 02/08/19  6:27 PM  Result Value Ref Range   Specimen Description URINE, RANDOM    Special Requests unknown Normal    Culture      NO GROWTH Performed at [facility], [place] [address], [place], [place] [address]    Report Status 02/09/2019 FINAL   Body fluid culture     Status: None (Preliminary result)   Collection Time: 02/09/19  1:43 AM  Result Value Ref Range   Specimen Description FLUID SYNOVIAL LEFT KNEE    Special Requests SYRINGE    Gram Stain      ABUNDANT WBC PRESENT, PREDOMINANTLY PMN NO ORGANISMS SEEN Performed at [facility], [place] [address], [place], [place] [address]      Culture PENDING    Report Status PENDING   Synovial cell count + diff, w/ crystals     Status: Abnormal   Collection Time: 02/09/19  1:43 AM  Result Value Ref Range   Color, Synovial RED (A) YELLOW    Comment: Performed at [facility], [place] [address], [place], [place] [address]   Appearance-Synovial TURBID (A) CLEAR    Comment: Performed at [facility], [place] [address], [place], [place] [address]   Crystals, Fluid INTRACELLULAR MONOSODIUM URATE CRYSTALS     Comment: EXTRACELLULAR MONOSODIUM URATE CRYSTALS Performed at [facility] [address] [place], [place] [address]  WBC, Synovial SPECIMEN CLOTTED, UNABLE TO PERFORM COUNT 0 - 200 /cu mm   Neutrophil, Synovial 94 (H) 0 - 25 %   Lymphocytes-Synovial Fld 1 0 - 20 %   Monocyte-Macrophage-Synovial Fluid 5 (L) 50 - 90 %   Eosinophils-Synovial 0 0 - 1 %    Comment: Performed at [facility] [address] [place], [place] [address]  CBC     Status: Abnormal   Collection Time: 02/09/19  2:41 AM  Result Value Ref Range   WBC 15.4 (H) 4.0 - 10.5 K/uL   RBC 3.64 (L) 4.22 - 5.81 MIL/uL   Hemoglobin 10.3 (L) 13.0 - 17.0 g/dL   HCT 31.3 (L) 39.0 - 52.0 %   MCV 86.0 80.0 - 100.0 fL   MCH 28.3 26.0 - 34.0 pg   MCHC 32.9 30.0 - 36.0 g/dL   RDW 14.4 11.5 - 15.5 %   Platelets 259 150 - 400 K/uL   nRBC 0.0 0.0 - 0.2 %    Comment: Performed at [facility], [place] [address], [place], [place] [address]  Lactic acid, plasma     Status: None   Collection Time: 02/09/19  2:41 AM  Result Value Ref Range   Lactic Acid, Venous 1.4 0.5 - 1.9 mmol/L    Comment: Performed at [facility] [address], [place], [place] [address]  Procalcitonin     Status: None   Collection Time: 02/09/19  2:41 AM  Result Value Ref Range   Procalcitonin 0.56 ng/mL    Comment:        Interpretation: PCT > 0.5 ng/mL and <= 2 ng/mL: Systemic infection (sepsis) is possible, but other conditions are known to  elevate PCT as well. (NOTE)       Sepsis PCT Algorithm           Lower Respiratory Tract                                      Infection PCT Algorithm    ----------------------------     ----------------------------         PCT < 0.25 ng/mL                PCT < 0.10 ng/mL         Strongly encourage             Strongly discourage   discontinuation of antibiotics    initiation of antibiotics    ----------------------------     -----------------------------       PCT 0.25 - 0.50 ng/mL            PCT 0.10 - 0.25 ng/mL               OR       >80% decrease in PCT            Discourage initiation of                                            antibiotics      Encourage discontinuation           of antibiotics    ----------------------------     -----------------------------         PCT >= 0.50 ng/mL  PCT 0.26 - 0.50 ng/mL                AND       <80% decrease in PCT             Encourage initiation of                                             antibiotics       Encourage continuation           of antibiotics    ----------------------------     -----------------------------        PCT >= 0.50 ng/mL                  PCT > 0.50 ng/mL               AND         increase in PCT                  Strongly encourage                                      initiation of antibiotics    Strongly encourage escalation           of antibiotics                                     -----------------------------                                           PCT <= 0.25 ng/mL                                                 OR                                        > 80% decrease in PCT                                     Discontinue / Do not initiate                                             antibiotics Performed at [facility], [address] [place], [place] [address]   Type and screen [place]     Status: None   Collection Time: 02/09/19  2:50 AM  Result Value  Ref Range   ABO/RH(D) A POS    Antibody Screen NEG    Sample Expiration      02/12/2019 Performed at [facility], [place] [address] [place], [place] [address]   CBC  Status: Abnormal   Collection Time: 02/09/19  5:24 AM  Result Value Ref Range   WBC 12.1 (H) 4.0 - 10.5 K/uL   RBC 3.62 (L) 4.22 - 5.81 MIL/uL   Hemoglobin 9.9 (L) 13.0 - 17.0 g/dL   HCT 31.8 (L) 39.0 - 52.0 %   MCV 87.8 80.0 - 100.0 fL   MCH 27.3 26.0 - 34.0 pg   MCHC 31.1 30.0 - 36.0 g/dL   RDW 14.3 11.5 - 15.5 %   Platelets 268 150 - 400 K/uL   nRBC 0.0 0.0 - 0.2 %    Comment: Performed at [facility], [place] [address] [place], [place] [address]  Comprehensive metabolic panel     Status: Abnormal   Collection Time: 02/09/19  5:24 AM  Result Value Ref Range   Sodium 136 135 - 145 mmol/L   Potassium 3.3 (L) 3.5 - 5.1 mmol/L   Chloride 106 98 - 111 mmol/L   CO2 21 (L) 22 - 32 mmol/L   Glucose, Bld 114 (H) 70 - 99 mg/dL   BUN 21 8 - 23 mg/dL   Creatinine, Ser 0.87 0.61 - 1.24 mg/dL   Calcium 8.4 (L) 8.9 - 10.3 mg/dL   Total Protein 6.4 (L) 6.5 - 8.1 g/dL   Albumin 2.1 (L) 3.5 - 5.0 g/dL   AST 17 15 - 41 U/L   ALT 18 0 - 44 U/L   Alkaline Phosphatase 77 38 - 126 U/L   Total Bilirubin 1.3 (H) 0.3 - 1.2 mg/dL   GFR calc non Af Amer >60 >60 mL/min   GFR calc Af Amer >60 >60 mL/min   Anion gap 9 5 - 15    Comment: Performed at [facility], [place] [address], [place], [place] [address]  Lactic acid, plasma     Status: None   Collection Time: 02/09/19  5:24 AM  Result Value Ref Range   Lactic Acid, Venous 1.3 0.5 - 1.9 mmol/L    Comment: Performed at [facility] [address], [place], [place] [address]  CBC     Status: Abnormal   Collection Time: 02/09/19  9:53 AM  Result Value Ref Range   WBC 12.8 (H) 4.0 - 10.5 K/uL   RBC 3.36 (L) 4.22 - 5.81 MIL/uL   Hemoglobin 9.5 (L) 13.0 - 17.0 g/dL   HCT 29.1 (L) 39.0 - 52.0 %   MCV 86.6 80.0 - 100.0 fL   MCH 28.3 26.0 - 34.0 pg   MCHC  32.6 30.0 - 36.0 g/dL   RDW 14.4 11.5 - 15.5 %   Platelets 275 150 - 400 K/uL   nRBC 0.0 0.0 - 0.2 %    Comment: Performed at [facility], [place] [address], [place], [place] [address]  Protime-INR     Status: Abnormal   Collection Time: 02/09/19  9:53 AM  Result Value Ref Range   Prothrombin Time 53.8 (H) 11.4 - 15.2 seconds    Comment: REPEATED TO VERIFY   INR 6.2 (HH) 0.8 - 1.2    Comment: REPEATED TO VERIFY CRITICAL RESULT CALLED TO, READ BACK BY AND VERIFIED WITH: Donetta Potts, RN [phone] 02/09/2019 BY MACEDA,J. (NOTE) INR goal varies based on device and disease states. Performed at [facility], [place] [address], [place], [place] [address]     Ct Head Wo Contrast  Result Date: 02/08/2019 CLINICAL DATA:  Altered level of consciousness EXAM: CT HEAD WITHOUT CONTRAST TECHNIQUE: Contiguous axial  images were obtained from the base of the skull through the vertex without intravenous contrast. COMPARISON:  None. FINDINGS: Brain: No evidence of acute infarction, hemorrhage, extra-axial collection, ventriculomegaly, or mass effect. Generalized cerebral atrophy. Periventricular white matter low attenuation likely secondary to microangiopathy. Vascular: Cerebrovascular atherosclerotic calcifications are noted. Skull: Negative for fracture or focal lesion. Sinuses/Orbits: Visualized portions of the orbits are unremarkable. Visualized portions of the paranasal sinuses are unremarkable. Visualized portions of the mastoid air cells are unremarkable. Other: None. IMPRESSION: 1. No acute intracranial pathology. 2. Chronic microvascular disease and cerebral atrophy. Electronically Signed   By: Kathreen Devoid   On: 02/08/2019 16:18   Ct Abdomen Pelvis W Contrast  Result Date: 02/08/2019 CLINICAL DATA:  Abdominal pain EXAM: CT ABDOMEN AND PELVIS WITH CONTRAST TECHNIQUE: Multidetector CT imaging of the abdomen and pelvis was performed using the standard protocol following bolus administration of  intravenous contrast. CONTRAST:  190m OMNIPAQUE IOHEXOL 300 MG/ML  SOLN COMPARISON:  07/08/2013 FINDINGS: LOWER CHEST: Enlarged left atrium. No pleural effusion. Right basilar atelectasis. HEPATOBILIARY: The hepatic contours and density are normal. There is no intra- or extrahepatic biliary dilatation. There is cholelithiasis without acute inflammation. Focal fatty deposition along the gallbladder fossa. PANCREAS: The pancreatic parenchymal contours are normal and there is no ductal dilatation. There is no peripancreatic fluid collection. SPLEEN: Normal. ADRENALS/URINARY TRACT: --Adrenal glands: Normal. --Right kidney/ureter: No hydronephrosis, nephroureterolithiasis, perinephric stranding or solid renal mass. --Left kidney/ureter: No hydronephrosis, nephroureterolithiasis, perinephric stranding or solid renal mass. --Urinary bladder: Normal for degree of distention STOMACH/BOWEL: --Stomach/Duodenum: There is no hiatal hernia or other gastric abnormality. The duodenal course and caliber are normal. --Small bowel: No dilatation or inflammation. --Colon: No focal abnormality. --Appendix: Not visualized. No right lower quadrant inflammation or free fluid. VASCULAR/LYMPHATIC: There is aortic atherosclerosis without hemodynamically significant stenosis. The portal vein, splenic vein, superior mesenteric vein and IVC are patent. No abdominal or pelvic lymphadenopathy. REPRODUCTIVE: Enlarged prostate measures 7.8 cm in transverse dimension. MUSCULOSKELETAL. 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 OTHER: None. IMPRESSION: 1. No acute abnormality of the abdomen or pelvis. 2. Cholelithiasis without acute inflammation. 3. Markedly enlarged prostate. 4. 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 Electronically Signed   By: KUlyses JarredM.D.   On: 02/08/2019 16:58   Mr Knee Left W Wo Contrast  Result Date:  02/09/2019 CLINICAL DATA:  He has significant left knee pain, however it appears bruised. He does not remember fall. Wife reports he hit his left knee while at church 2 weeks ago and had abrasion and pain. EXAM: MRI OF THE LEFT KNEE WITHOUT AND WITH CONTRAST TECHNIQUE: Multiplanar, multisequence MR imaging of the left knee was performed both before and after administration of intravenous contrast. CONTRAST:  9 mL Gadavist COMPARISON:  None. FINDINGS: MENISCI Medial meniscus: Horizontal tear of the posterior horn of the medial meniscus extend to the free edge. Horizontal tear of the anterior horn-body junction of medial meniscus extending to the superior articular surface. Lateral meniscus: Complex tear of the anterior horn of lateral meniscus. LIGAMENTS Cruciates: Intact ACL. Mild increased signal in the PCL concerning for PCL strain. Collaterals: Medial collateral ligament is intact. Lateral collateral ligament complex is intact. CARTILAGE Patellofemoral: Partial-thickness cartilage loss of the patellofemoral compartment with subchondral reactive marrow changes in the patellar apex. Medial: Partial-thickness cartilage loss of medial femorotibial compartment. Lateral: Mild partial-thickness cartilage loss of the lateral femorotibial compartment. Joint: Moderate joint effusion with areas  of T1 hyperintensity likely reflecting hemarthrosis. Smooth synovial enhancement. Edema in Hoffa's fat. Popliteal Fossa: Small hemorrhagic Baker's cyst. Intact popliteus tendon. Extensor Mechanism: Intact quadriceps tendon. Severe thickening and increased signal of the patellar tendon most consistent with severe tendinosis with a high-grade partial thickness, near complete, tear at the patellar tendon insertion with a few medial fibers remaining intact and a 1.4 x 3.6 cm hematoma at the patellar tendon insertion. Avulsed bony fragment is noted along the distal and of the patellar tendon tear avulsed from the tibial tuberosity. Intact  medial patellar retinaculum. Intact lateral patellar retinaculum. Intact MPFL. Bones: Severe marrow edema in the anterolateral tibial plateau with cortical irregularity concerning for a nondisplaced fracture which is adjacent to the avulsed tibial tubercle. Marrow edema in the anterolateral femoral condyle with peripheral cortical disruption concerning for a nondisplaced fracture. Adjacent to the area of cortical disruption there is a 14 mm fluid collection peripherally deep to the fibular collateral ligament and adjacent to the popliteus tendon concerning for a small hematoma. Other: Mild muscle edema in the medial gastrocnemius muscle and to lesser extent lateral gastrocnemius muscle most concerning for mild muscle strain with a small 11 mm intramuscular hematoma in the medial gastrocnemius muscle. Diffuse soft tissue edema around the knee within the subcutaneous fat likely reactive versus less likely secondary to cellulitis. Prepatellar soft tissue edema with a thin intermediate density fluid collection likely reflecting hemorrhagic bursitis. IMPRESSION: 1. Severe marrow edema in the anterolateral tibial plateau with cortical irregularity concerning for a nondisplaced fracture which is adjacent to the avulsed tibial tubercle. Marrow edema in the anterolateral femoral condyle with peripheral cortical disruption concerning for a nondisplaced fracture. Adjacent to the area of cortical disruption of the lateral femoral condyle there is a 14 mm fluid collection peripherally deep to the fibular collateral ligament and adjacent to the popliteus tendon concerning for a small hematoma. This overall appearance can be seen with hyperextension injury versus direct trauma. 2. Mild increased signal in the PCL concerning for PCL strain. 3. Moderate size hemarthrosis with smooth synovial enhancement. This is likely posttraumatic and inflammatory in nature, but secondarily infected fluid can not be excluded. 4. Severe tendinosis  of the patellar tendon most consistent with with a high-grade partial thickness, near complete, tear at the patellar tendon insertion with a few medial fibers remaining intact and a 1.4 x 3.6 cm hematoma at the patellar tendon insertion. Avulsed bony fragment is noted along the distal and of the patellar tendon tear avulsed from the tibial tuberosity. This appearance can be seen in the setting of gout. 5. Prepatellar soft tissue edema with a thin intermediate density fluid collection likely reflecting hemorrhagic bursitis. 6. Tricompartmental cartilage abnormalities as described above most consistent with mild osteoarthritis. 7. Horizontal tear of the posterior horn of the medial meniscus extend to the free edge. Horizontal tear of the anterior horn-body junction of medial meniscus extending to the superior articular surface. 8. Complex tear of the anterior horn of lateral meniscus. Electronically Signed   By: Kathreen Devoid   On: 02/09/2019 08:24   Dg Chest Port 1 View  Result Date: 02/08/2019 CLINICAL DATA:  Fever, altered mental status, leukocytosis. EXAM: PORTABLE CHEST 1 VIEW COMPARISON:  Chest x-ray dated July 10, 2015. FINDINGS: The heart size and mediastinal contours are within normal limits. Atherosclerotic calcification of the aortic arch. Normal pulmonary vascularity. No focal consolidation, pleural effusion, or pneumothorax. No acute osseous abnormality. IMPRESSION: No active disease. Electronically Signed   By: Titus Dubin  M.D.   On: 02/08/2019 14:23   Dg Knee Complete 4 Views Left  Result Date: 02/08/2019 CLINICAL DATA:  Pain in both legs.  Elevated white count and fever. EXAM: LEFT KNEE - COMPLETE 4+ VIEW COMPARISON:  12/20/2009 FINDINGS: Small suprapatellar joint effusion identified. There is anterior soft tissue swelling with edema subcutaneous anterior to the knee. Interval avulsion of the tibial tuberosity with possible osteolysis involving the undersurface of the lower pole of patella  and tibial tubercle. Mild tricompartment osteoarthritis and chondrocalcinosis noted. IMPRESSION: 1. There has been interval avulsion of the tibial tubercle with suspected osteolysis involving the lower pole of patella and tibial tubercle. Overlying soft tissue swelling and small joint effusion is noted. Cannot rule out underlying osteomyelitis. Recommend further evaluation with contrast enhanced MR of the knee. Electronically Signed   By: Kerby Moors M.D.   On: 02/08/2019 19:53    ROS Blood pressure 113/85, pulse 94, temperature 98.5 F (36.9 C), temperature source Oral, resp. rate 19, height _0  (1.778 m), weight 89.3 kg, SpO2 98 %. Physical Exam Vitals signs and nursing note reviewed.  Constitutional:      General: He is awake.     Comments: Oriented to person, place and month.  States that it is 2014 Very pleasant and talkative white male.  Appropriate for stated age  Cardiovascular:     Rate and Rhythm: Normal rate. Rhythm irregularly irregular.  Pulmonary:     Effort: No accessory muscle usage or respiratory distress.     Comments: Clear anterior fields Abdominal:     Comments: Soft, nontender, nondistended,+ bowel sounds  Musculoskeletal:     Comments: Left lower extremity Patient does not allow for much in the way of the exam Moderate knee effusion is present presumed hemarthrosis Pitting edema to the lower leg is noted as well No knee immobilizer or compressive wrap Dressing in place from knee arthrocentesis Hip is unremarkable no pain with gentle logrolling of his hip  Patient has exquisite tenderness with palpation of his tibial tuberosity and patellar tendon Distal femur superior and inferior poles of the patella are nontender.  He is nontender along the medial lateral borders of the patella as well No significant erythema or cellulitis around the knee is noted  Lower leg, ankle and foot are nontender  Compartments are soft, no pain with passive stretching  DPN,  SPN, TN sensory functions are grossly intact EHL, FHL, lesser toe motor function are intact Patient will flex, extend, invert and evert his ankle without much difficulty  Did not perform knee range of motion due to pain  I do not appreciate any traumatic wounds that are of concern to the left leg  + DP pulse Extremity is warm  Neurological:     Comments: Unable to assess gait and coordination  Psychiatric:        Behavior: Behavior is cooperative.        Cognition and Memory: Memory is not impaired.     Comments: Again patient is very pleasant and talkative but he is confused  He states that he is 89.  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   1.  Left patella avulsion fracture from tibial tuberosity   2.  Medial  and lateral meniscal tears   3.  Nondisplaced fractures versus bony contusion to the distal lateral femoral condyle and lateral tibial plateau   4.  Tricompartmental arthritis   5.  Warfarin induced coagulopathy with left knee hemarthrosis   6.  Gout flare    Fortunately does not appear that there is an infectious etiology to the patient's left knee pain.  MRI and synovial fluid analysis did not suggest infectious etiology however we will continue to follow synovial fluid culture until is been allowed ample time to grow out    Patient found to be fairly ambulatory at baseline and would recommend surgical intervention to address his patella injury.  I am concerned about his ability to follow instructions postoperatively.  We may need to put the patient in a long-leg cast postoperatively to protect the repair.  This along with surgery to his knee will likely make it very difficult for him to bear weight on that left  leg safely.  He will likely need to be in a wheelchair to protect his repair as best possible and to minimize his chance of falling.    Highly likely that the patient may have fallen 2 weeks ago which may have resulted in partial injury to the insertion of the patella and over the subsequent weeks it went on to complete failure thus causing his inability to bear weight and get up out of a chair.  I do think the fact that he also has nondisplaced fractures of his distal femur and lateral tibial plateau also points towards a unwitnessed fall.    Would anticipate take the patient to the OR at the end of the week.    Patient does have fairly complex tears of his menisci and ?  If arthroscopic debridement at the same surgical setting is appropriate.  Will discuss further    Will have patient placed into a bulky compressive dressing as well as a knee immobilizer.   Okay get the patient out of bed.  He can put weight through his left knee to facilitate transfers from his knee immobilizer remains on   Ice and elevate to help with swelling control  - Pain management:  Titrate accordingly  - ABL anemia/Hemodynamics  Work-up per hospitalist service  I do think that his hemarthrosis is playing a little bit of a role in his hemoglobin drop  Patient also does have large hemoglobin on urinalysis   CBC in the morning  - Medical issues   Per hospitalist service   - DVT/PE prophylaxis:  Coumadin on hold  Vitamin K has been ordered  Follow INR - ID:   No obvious source of infection identified  Patient has been afebrile since admission to the floor  He does have a mild leukocytosis  Synovial fluid culture is pending, synovial fluid protein and glucose are also pending  Blood cultures pending  Urine cultures pending   On empiric antibiotics-cefepime, Flagyl and vancomycin  - Activity:  Up with assistance  WBAT L leg with knee immobilizer on  - FEN/GI prophylaxis/Foley/Lines:  Regular diet,  appreciate SLP evaluation   To follow-up postop  - Impediments to fracture healing:  Impaired cognitive function  - Dispo:  Continue with current care  Work-up ongoing  Hopeful for OR at the end of the week  Wife very adamant about patient returning home and not going to a skilled nursing center and given current pandemic.   Russell Pigg, PA-C [phone] (C) 02/09/2019, 5:36 PM  [facility] [place] [facility] [place] [address] [phone] (F)

## 2019-02-09 NOTE — ED Notes (Signed)
ED TO INPATIENT HANDOFF REPORT  ED Nurse Name and Phone #:  [phone]  S Name/Age/Gender Russell Dillon 73 y.o. male Room/Bed: 034C/034C  Code Status   Code Status: Full Code  Home/SNF/Other Home Patient oriented to: self Is this baseline? Yes   Triage Complete: Triage complete  Chief Complaint meds  Triage Note [facility] for further evaluation of bilateral leg pain, elevated white count, fever, increased weakness, and altered mental status; hx dementia   Allergies No Known Allergies  Level of Care/Admitting Diagnosis ED Disposition    ED Disposition Condition [facility] Area: [place] [[id number]]  Level of Care: Progressive [102]  Diagnosis: SIRS (systemic inflammatory response syndrome) [facility]) [[id number]]  Admitting Physician: Rise Patience [phone]  Attending Physician: Rise Patience [phone]  Estimated length of stay: past midnight tomorrow  Certification:: I certify this patient will need inpatient services for at least 2 midnights  PT Class (Do Not Modify): Inpatient [101]  PT Acc Code (Do Not Modify): Private [1]       B Medical/Surgery History Past Medical History:  Diagnosis Date  . Aortic valve regurgitation 10/16/2018   Echo 07/28/2017:  EF 60-65, moderate AI, aortic root and ascending aorta mildly dilated (40 mm), ascending aorta 43 mm, MAC, mild MR, mild LAE, moderate RV enlargement, trivial TR // Echo 12/19: EF 60-65, normal wall motion, mild AI, moderate BAE, ascending aorta 43 mm, aortic root 39 mm  . BPH (benign prostatic hypertrophy)   . Chronic anticoagulation   . Chronic atrial fibrillation   . Diastolic dysfunction October 2010   Normal LV systolic function  . Hx SBO   . Hyperlipidemia   . Hypertension   . Small bowel obstruction [facility])    Past Surgical History:  Procedure Laterality Date  . APPENDECTOMY  2004  . CARDIOVASCULAR STRESS TEST  09/30/2007   EF 67%  No  ischemia.   Marland Kitchen CARDIOVERSION  05/01/2004  . SMALL INTESTINE SURGERY  2004  . US ECHOCARDIOGRAPHY  09/05/2009   ef 55-60%     A IV Location/Drains/Wounds Patient Lines/Drains/Airways Status   Active Line/Drains/Airways    Name:   Placement date:   Placement time:   Site:   Days:   Peripheral IV 02/08/19 Right Forearm   02/08/19    1355    Forearm   1          Intake/Output Last 24 hours  Intake/Output Summary (Last 24 hours) at 02/09/2019 0706 Last data filed at 02/08/2019 2137 Gross per 24 hour  Intake 2310.93 ml  Output -  Net 2310.93 ml    Labs/Imaging Results for orders placed or performed during the hospital encounter of 02/08/19 (from the past 48 hour(s))  Comprehensive metabolic panel     Status: Abnormal   Collection Time: 02/08/19  2:01 PM  Result Value Ref Range   Sodium 133 (L) 135 - 145 mmol/L   Potassium 3.7 3.5 - 5.1 mmol/L   Chloride 99 98 - 111 mmol/L   CO2 22 22 - 32 mmol/L   Glucose, Bld 138 (H) 70 - 99 mg/dL   BUN 26 (H) 8 - 23 mg/dL   Creatinine, Ser 1.17 0.61 - 1.24 mg/dL   Calcium 8.9 8.9 - 10.3 mg/dL   Total Protein 7.6 6.5 - 8.1 g/dL   Albumin 2.7 (L) 3.5 - 5.0 g/dL   AST 26 15 - 41 U/L   ALT 22 0 - 44 U/L  Alkaline Phosphatase 97 38 - 126 U/L   Total Bilirubin 1.9 (H) 0.3 - 1.2 mg/dL   GFR calc non Af Amer >60 >60 mL/min   GFR calc Af Amer >60 >60 mL/min   Anion gap 12 5 - 15    Comment: Performed at [facility] [address], [place], [place] [address]  CBC WITH DIFFERENTIAL     Status: Abnormal   Collection Time: 02/08/19  2:01 PM  Result Value Ref Range   WBC 18.4 (H) 4.0 - 10.5 K/uL   RBC 3.82 (L) 4.22 - 5.81 MIL/uL   Hemoglobin 10.8 (L) 13.0 - 17.0 g/dL   HCT 33.7 (L) 39.0 - 52.0 %   MCV 88.2 80.0 - 100.0 fL   MCH 28.3 26.0 - 34.0 pg   MCHC 32.0 30.0 - 36.0 g/dL   RDW 14.4 11.5 - 15.5 %   Platelets 299 150 - 400 K/uL   nRBC 0.0 0.0 - 0.2 %   Neutrophils Relative % 88 %   Neutro Abs 16.3 (H) 1.7 - 7.7 K/uL    Lymphocytes Relative 4 %   Lymphs Abs 0.8 0.7 - 4.0 K/uL   Monocytes Relative 7 %   Monocytes Absolute 1.3 (H) 0.1 - 1.0 K/uL   Eosinophils Relative 0 %   Eosinophils Absolute 0.0 0.0 - 0.5 K/uL   Basophils Relative 0 %   Basophils Absolute 0.0 0.0 - 0.1 K/uL   Immature Granulocytes 1 %   Abs Immature Granulocytes 0.09 (H) 0.00 - 0.07 K/uL    Comment: Performed at [facility], [address], [place], [place] [address]  Protime-INR     Status: Abnormal   Collection Time: 02/08/19  2:01 PM  Result Value Ref Range   Prothrombin Time 40.7 (H) 11.4 - 15.2 seconds   INR 4.3 (HH) 0.8 - 1.2    Comment: REPEATED TO VERIFY CRITICAL RESULT CALLED TO, READ BACK BY AND VERIFIED WITH: K.MOON RN 1456 02/08/2019 MCCORMICK K (NOTE) INR goal varies based on device and disease states. Performed at [facility], [place] [address] [place], [place] [address]   Lithium level     Status: Abnormal   Collection Time: 02/08/19  2:01 PM  Result Value Ref Range   Lithium Lvl 0.14 (L) 0.60 - 1.20 mmol/L    Comment: Performed at [facility] [address], [place], [place] [address]  Brain natriuretic peptide     Status: Abnormal   Collection Time: 02/08/19  2:01 PM  Result Value Ref Range   B Natriuretic Peptide 220.8 (H) 0.0 - 100.0 pg/mL    Comment: Performed at [facility] [address], [place], [place] [address]  CBG monitoring, ED     Status: Abnormal   Collection Time: 02/08/19  2:10 PM  Result Value Ref Range   Glucose-Capillary 118 (H) 70 - 99 mg/dL  Lactic acid, plasma     Status: None   Collection Time: 02/08/19  4:55 PM  Result Value Ref Range   Lactic Acid, Venous 1.8 0.5 - 1.9 mmol/L    Comment: Performed at [facility] [address] [place], [place] [address]  Urinalysis, Complete w Microscopic     Status: Abnormal   Collection Time: 02/08/19  6:20 PM  Result Value Ref Range   Color, Urine YELLOW YELLOW   APPearance CLEAR CLEAR   Specific  Gravity, Urine 1.029 1.005 - 1.030   pH 5.0 5.0 -  8.0   Glucose, UA NEGATIVE NEGATIVE mg/dL   Hgb urine dipstick LARGE (A) NEGATIVE   Bilirubin Urine NEGATIVE NEGATIVE   Ketones, ur NEGATIVE NEGATIVE mg/dL   Protein, ur 30 (A) NEGATIVE mg/dL   Nitrite NEGATIVE NEGATIVE   Leukocytes,Ua NEGATIVE NEGATIVE   RBC / HPF >50 (H) 0 - 5 RBC/hpf   WBC, UA 0-5 0 - 5 WBC/hpf   Bacteria, UA RARE (A) NONE SEEN   Squamous Epithelial / LPF 0-5 0 - 5   Mucus PRESENT     Comment: Performed at [facility], [place] [address], [place], [place] [address]  Body fluid culture     Status: None (Preliminary result)   Collection Time: 02/09/19  1:43 AM  Result Value Ref Range   Specimen Description FLUID SYNOVIAL LEFT KNEE    Special Requests SYRINGE    Gram Stain      ABUNDANT WBC PRESENT, PREDOMINANTLY PMN NO ORGANISMS SEEN Performed at [facility], [place] [address] [place], [place] [address]    Culture PENDING    Report Status PENDING   Synovial cell count + diff, w/ crystals     Status: Abnormal   Collection Time: 02/09/19  1:43 AM  Result Value Ref Range   Color, Synovial RED (A) YELLOW    Comment: Performed at [facility], [place] [address], [place], [place] [address]   Appearance-Synovial TURBID (A) CLEAR    Comment: Performed at [facility], [place] [address] [place], [place] [address]   Crystals, Fluid INTRACELLULAR MONOSODIUM URATE CRYSTALS     Comment: EXTRACELLULAR MONOSODIUM URATE CRYSTALS Performed at [facility] [name] [place], [place] [address]    WBC, Synovial SPECIMEN CLOTTED, UNABLE TO PERFORM COUNT 0 - 200 /cu mm   Neutrophil, Synovial 94 (H) 0 - 25 %   Lymphocytes-Synovial Fld 1 0 - 20 %   Monocyte-Macrophage-Synovial Fluid 5 (L) 50 - 90 %   Eosinophils-Synovial 0 0 - 1 %    Comment: Performed at [facility] [address] [place], [place] [address]  CBC     Status: Abnormal   Collection Time: 02/09/19  2:41 AM  Result  Value Ref Range   WBC 15.4 (H) 4.0 - 10.5 K/uL   RBC 3.64 (L) 4.22 - 5.81 MIL/uL   Hemoglobin 10.3 (L) 13.0 - 17.0 g/dL   HCT 31.3 (L) 39.0 - 52.0 %   MCV 86.0 80.0 - 100.0 fL   MCH 28.3 26.0 - 34.0 pg   MCHC 32.9 30.0 - 36.0 g/dL   RDW 14.4 11.5 - 15.5 %   Platelets 259 150 - 400 K/uL   nRBC 0.0 0.0 - 0.2 %    Comment: Performed at [facility], [place] [address] [place], [place] [address]  Lactic acid, plasma     Status: None   Collection Time: 02/09/19  2:41 AM  Result Value Ref Range   Lactic Acid, Venous 1.4 0.5 - 1.9 mmol/L    Comment: Performed at [facility] [address] [place],  [address]  Procalcitonin     Status: None   Collection Time: 02/09/19  2:41 AM  Result Value Ref Range   Procalcitonin 0.56 ng/mL    Comment:        Interpretation: PCT > 0.5 ng/mL and <= 2 ng/mL: Systemic infection (sepsis) is possible, but other conditions are known to elevate PCT as well. (NOTE)  Sepsis PCT Algorithm           Lower Respiratory Tract                                      Infection PCT Algorithm    ----------------------------     ----------------------------         PCT < 0.25 ng/mL                PCT < 0.10 ng/mL         Strongly encourage             Strongly discourage   discontinuation of antibiotics    initiation of antibiotics    ----------------------------     -----------------------------       PCT 0.25 - 0.50 ng/mL            PCT 0.10 - 0.25 ng/mL               OR       >80% decrease in PCT            Discourage initiation of                                            antibiotics      Encourage discontinuation           of antibiotics    ----------------------------     -----------------------------         PCT >= 0.50 ng/mL              PCT 0.26 - 0.50 ng/mL                AND       <80% decrease in PCT             Encourage initiation of                                             antibiotics       Encourage continuation            of antibiotics    ----------------------------     -----------------------------        PCT >= 0.50 ng/mL                  PCT > 0.50 ng/mL               AND         increase in PCT                  Strongly encourage                                      initiation of antibiotics    Strongly encourage escalation           of antibiotics                                     -----------------------------  PCT <= 0.25 ng/mL                                                 OR                                        > 80% decrease in PCT                                     Discontinue / Do not initiate                                             antibiotics Performed at [facility], [place] [address] [place], [place] [address]   Type and screen [place]     Status: None   Collection Time: 02/09/19  2:50 AM  Result Value Ref Range   ABO/RH(D) A POS    Antibody Screen NEG    Sample Expiration      02/12/2019 Performed at [facility], [place] [address], [place], [place] [address]   CBC     Status: Abnormal   Collection Time: 02/09/19  5:24 AM  Result Value Ref Range   WBC 12.1 (H) 4.0 - 10.5 K/uL   RBC 3.62 (L) 4.22 - 5.81 MIL/uL   Hemoglobin 9.9 (L) 13.0 - 17.0 g/dL   HCT 31.8 (L) 39.0 - 52.0 %   MCV 87.8 80.0 - 100.0 fL   MCH 27.3 26.0 - 34.0 pg   MCHC 31.1 30.0 - 36.0 g/dL   RDW 14.3 11.5 - 15.5 %   Platelets 268 150 - 400 K/uL   nRBC 0.0 0.0 - 0.2 %    Comment: Performed at [facility], [place] [address], [place], [place] [address]  Comprehensive metabolic panel     Status: Abnormal   Collection Time: 02/09/19  5:24 AM  Result Value Ref Range   Sodium 136 135 - 145 mmol/L   Potassium 3.3 (L) 3.5 - 5.1 mmol/L   Chloride 106 98 - 111 mmol/L   CO2 21 (L) 22 - 32 mmol/L   Glucose, Bld 114 (H) 70 - 99 mg/dL   BUN 21 8 - 23 mg/dL   Creatinine, Ser 0.87 0.61 - 1.24 mg/dL   Calcium 8.4 (L) 8.9 - 10.3 mg/dL    Total Protein 6.4 (L) 6.5 - 8.1 g/dL   Albumin 2.1 (L) 3.5 - 5.0 g/dL   AST 17 15 - 41 U/L   ALT 18 0 - 44 U/L   Alkaline Phosphatase 77 38 - 126 U/L   Total Bilirubin 1.3 (H) 0.3 - 1.2 mg/dL   GFR calc non Af Amer >60 >60 mL/min   GFR calc Af Amer >60 >60 mL/min   Anion gap 9 5 - 15    Comment: Performed at [facility], [place] [address] [place], [place] [address]  Lactic acid, plasma     Status: None   Collection Time: 02/09/19  5:24 AM  Result Value Ref Range   Lactic  Acid, Venous 1.3 0.5 - 1.9 mmol/L    Comment: Performed at [facility] [address], [place], [place] [address]   Ct Head Wo Contrast  Result Date: 02/08/2019 CLINICAL DATA:  Altered level of consciousness EXAM: CT HEAD WITHOUT CONTRAST TECHNIQUE: Contiguous axial images were obtained from the base of the skull through the vertex without intravenous contrast. COMPARISON:  None. FINDINGS: Brain: No evidence of acute infarction, hemorrhage, extra-axial collection, ventriculomegaly, or mass effect. Generalized cerebral atrophy. Periventricular white matter low attenuation likely secondary to microangiopathy. Vascular: Cerebrovascular atherosclerotic calcifications are noted. Skull: Negative for fracture or focal lesion. Sinuses/Orbits: Visualized portions of the orbits are unremarkable. Visualized portions of the paranasal sinuses are unremarkable. Visualized portions of the mastoid air cells are unremarkable. Other: None. IMPRESSION: 1. No acute intracranial pathology. 2. Chronic microvascular disease and cerebral atrophy. Electronically Signed   By: Kathreen Devoid   On: 02/08/2019 16:18   Ct Abdomen Pelvis W Contrast  Result Date: 02/08/2019 CLINICAL DATA:  Abdominal pain EXAM: CT ABDOMEN AND PELVIS WITH CONTRAST TECHNIQUE: Multidetector CT imaging of the abdomen and pelvis was performed using the standard protocol following bolus administration of intravenous contrast. CONTRAST:  13m OMNIPAQUE IOHEXOL 300  MG/ML  SOLN COMPARISON:  07/08/2013 FINDINGS: LOWER CHEST: Enlarged left atrium. No pleural effusion. Right basilar atelectasis. HEPATOBILIARY: The hepatic contours and density are normal. There is no intra- or extrahepatic biliary dilatation. There is cholelithiasis without acute inflammation. Focal fatty deposition along the gallbladder fossa. PANCREAS: The pancreatic parenchymal contours are normal and there is no ductal dilatation. There is no peripancreatic fluid collection. SPLEEN: Normal. ADRENALS/URINARY TRACT: --Adrenal glands: Normal. --Right kidney/ureter: No hydronephrosis, nephroureterolithiasis, perinephric stranding or solid renal mass. --Left kidney/ureter: No hydronephrosis, nephroureterolithiasis, perinephric stranding or solid renal mass. --Urinary bladder: Normal for degree of distention STOMACH/BOWEL: --Stomach/Duodenum: There is no hiatal hernia or other gastric abnormality. The duodenal course and caliber are normal. --Small bowel: No dilatation or inflammation. --Colon: No focal abnormality. --Appendix: Not visualized. No right lower quadrant inflammation or free fluid. VASCULAR/LYMPHATIC: There is aortic atherosclerosis without hemodynamically significant stenosis. The portal vein, splenic vein, superior mesenteric vein and IVC are patent. No abdominal or pelvic lymphadenopathy. REPRODUCTIVE: Enlarged prostate measures 7.8 cm in transverse dimension. MUSCULOSKELETAL. 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 OTHER: None. IMPRESSION: 1. No acute abnormality of the abdomen or pelvis. 2. Cholelithiasis without acute inflammation. 3. Markedly enlarged prostate. 4. 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 Electronically Signed   By: KUlyses JarredM.D.   On: 02/08/2019 16:58   Dg Chest Port 1 View  Result Date: 02/08/2019 CLINICAL DATA:  Fever, altered mental status, leukocytosis. EXAM:  PORTABLE CHEST 1 VIEW COMPARISON:  Chest x-ray dated July 10, 2015. FINDINGS: The heart size and mediastinal contours are within normal limits. Atherosclerotic calcification of the aortic arch. Normal pulmonary vascularity. No focal consolidation, pleural effusion, or pneumothorax. No acute osseous abnormality. IMPRESSION: No active disease. Electronically Signed   By: WTitus DubinM.D.   On: 02/08/2019 14:23   Dg Knee Complete 4 Views Left  Result Date: 02/08/2019 CLINICAL DATA:  Pain in both legs.  Elevated white count and fever. EXAM: LEFT KNEE - COMPLETE 4+ VIEW COMPARISON:  12/20/2009 FINDINGS: Small suprapatellar joint effusion identified. There is anterior soft tissue swelling with edema subcutaneous anterior to the knee. Interval avulsion of the tibial tuberosity with possible osteolysis involving the undersurface of the lower  pole of patella and tibial tubercle. Mild tricompartment osteoarthritis and chondrocalcinosis noted. IMPRESSION: 1. There has been interval avulsion of the tibial tubercle with suspected osteolysis involving the lower pole of patella and tibial tubercle. Overlying soft tissue swelling and small joint effusion is noted. Cannot rule out underlying osteomyelitis. Recommend further evaluation with contrast enhanced MR of the knee. Electronically Signed   By: Kerby Moors M.D.   On: 02/08/2019 19:53    Pending Labs Unresulted Labs (From admission, onward)    Start     Ordered   02/09/19 0228  CBC  Now then every 4 hours,   R     02/09/19 0228   02/09/19 0107  Glucose, Body Fluid Other  (Arthrocentesis Panel)  ONCE - STAT,   STAT     02/09/19 0107   02/09/19 0107  Protein, body fluid (other)  (Arthrocentesis Panel)  ONCE - STAT,   STAT     02/09/19 0107   02/08/19 1601  Blood culture (routine x 2)  BLOOD CULTURE X 2,   STAT     02/08/19 1600   02/08/19 1401  Urine culture  ONCE - STAT,   STAT    Question Answer Comment  List patient's active antibiotics unknown    Patient immune status Normal      02/08/19 1400          Vitals/Pain Today's Vitals   02/09/19 0430 02/09/19 0530 02/09/19 0600 02/09/19 0630  BP: 112/68 114/70 109/61 110/67  Pulse: 96 99 100 (!) 109  Resp: _0 Temp:      TempSrc:      SpO2: 98% 97% 97% 97%  Weight:      Height:      PainSc:        Isolation Precautions No active isolations  Medications Medications  donepezil (ARICEPT) tablet 10 mg (has no administration in time range)  lithium carbonate capsule 150 mg (has no administration in time range)  finasteride (PROSCAR) tablet 5 mg (has no administration in time range)  lactose free nutrition (BOOST PLUS) liquid 237 mL (has no administration in time range)  omega-3 fish oil (MAXEPA) capsule 1,000 mg (has no administration in time range)  acetaminophen (TYLENOL) tablet 650 mg (has no administration in time range)    Or  acetaminophen (TYLENOL) suppository 650 mg (has no administration in time range)  ondansetron (ZOFRAN) tablet 4 mg (has no administration in time range)    Or  ondansetron (ZOFRAN) injection 4 mg (has no administration in time range)  lactated ringers infusion ( Intravenous New Bag/Given 02/09/19 0356)  metroNIDAZOLE (FLAGYL) IVPB 500 mg (500 mg Intravenous Not Given 02/09/19 0322)  vancomycin (VANCOCIN) IVPB 750 mg/150 ml premix (0 mg Intravenous Stopped 02/09/19 0640)  ceFEPIme (MAXIPIME) 1 g in sodium chloride 0.9 % 100 mL IVPB (has no administration in time range)  sodium chloride 0.9 % bolus 1,000 mL (0 mLs Intravenous Stopped 02/08/19 1554)  acetaminophen (TYLENOL) tablet 1,000 mg (1,000 mg Oral Given 02/08/19 1742)  vancomycin (VANCOCIN) IVPB 1000 mg/200 mL premix (0 mg Intravenous Stopped 02/08/19 1914)  piperacillin-tazobactam (ZOSYN) IVPB 3.375 g (0 g Intravenous Stopped 02/08/19 1856)  iohexol (OMNIPAQUE) 300 MG/ML solution 100 mL (100 mLs Intravenous Contrast Given 02/08/19 1640)  sodium chloride 0.9 % bolus 250 mL (0 mLs  Intravenous Stopped 02/08/19 2137)  gadobutrol (GADAVIST) 1 MMOL/ML injection 9 mL (9 mLs Intravenous Contrast Given 02/09/19 0023)  lidocaine-EPINEPHrine (XYLOCAINE W/EPI) 2 %-1:200000 (PF) injection 20 mL (  20 mLs Intradermal Given 02/09/19 0146)  ceFEPIme (MAXIPIME) 2 g in sodium chloride 0.9 % 100 mL IVPB (0 g Intravenous Stopped 02/09/19 0357)    Mobility non-ambulatory     Focused Assessments Cardiac Assessment Handoff:  Cardiac Rhythm: Atrial fibrillation No results found for: CKTOTAL, CKMB, CKMBINDEX, TROPONINI No results found for: DDIMER Does the Patient currently have chest pain? No      R Recommendations: See Admitting Provider Note  Report given to:   Additional Notes:  Bilateral lower extremity swelling; infection to left knee (arthrocentesis performed), patient denies pain until you touch is leg.

## 2019-02-09 NOTE — Progress Notes (Addendum)
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  There are B meniscal tears possible acute on chronic given tricompartmental degenerative knee changes. Severe marrow edema of lateral distal femur and lateral tibial plateau consistent with bone contusion vs nondisplaced fracture.   pts INR is 6.2 today   Surgery will be on hold  May also benefit from arthroscopic debridement of his knee at the time of tendon repair.  Looking like more towards the end of the week.    Could let INR correct on own. Possible he could develop further hemarthrosis with his elevated INR. Defer to medicine.  Would anticipate surgery Thursday or Friday    Swallow eval given hx of dementia and planned surgery  Diet per ST recs    Will complete formal consult later this afternoon.   Jari Pigg, PA-C [phone] (C) 02/09/2019, 12:20 PM  [facility] [place] [address] [phone] (F)

## 2019-02-09 NOTE — ED Notes (Signed)
Arthrocentesis performed at bedside.

## 2019-02-09 NOTE — Progress Notes (Addendum)
PHARMACIST - PHYSICIAN ORDER COMMUNICATION  CONCERNING: P&T Medication Policy on Herbal Medications  DESCRIPTION:  This patient's order for:  Acetylcysteine  has been noted.  This product(s) is classified as an "herbal" or natural product. Due to a lack of definitive safety studies or FDA approval, nonstandard manufacturing practices, plus the potential risk of unknown drug-drug interactions while on inpatient medications, the Pharmacy and Therapeutics Committee does not permit the use of "herbal" or natural products of this type within [facility].   ACTION TAKEN: The pharmacy department is unable to verify this order at this time and your patient has been informed of this safety policy. Please reevaluate patient's clinical condition at discharge and address if the herbal or natural product(s) should be resumed at that time.

## 2019-02-09 NOTE — H&P (Signed)
History and Physical    [name] TMB:[id number] DOB: [birth year] DOA: 02/08/2019  PCP: Marton Redwood, MD  Patient coming from: Home.  History obtained from patient's wife.  Chief Complaint: Weakness and left leg pain.  HPI: Russell Dillon is a 73 y.o. male with history of cognitive impairment, atrial fibrillation, bipolar disorder was found to be increasingly weak with increasing pain in his left knee.  Patient has a chronic left knee problem with cyst which has been persistent for many years.  Over the last 10 days patient was finding it difficult to walk because of increasing pain in the left knee and increasing swelling.  Patient wife states the last 3 days patient was hardly able to ambulate because of intense weakness and also the left knee pain.  Had been taken to his PCPs office yesterday morning and blood count showed 4 g drop in hemoglobin.  Given the clinical picture patient was advised to come to the ER.  Per report patient had a fall about 2 weeks ago and hurt his left knee.  Per patient's wife patient did not have any chest pain productive cough fever chills or any contact with sick people specifically COVID-19.  ED Course: In the ER patient was mildly confused temperature 101 F labs showed leukocytosis.  On exam patient's left knee was tender to touch.  Chest x-ray was unremarkable.  UA did not show any signs of infection.  MRI of the left knee was done which shows features concerning for osteomyelitis and septic arthritis of the left knee.  Arthrocentesis been done after discussing with on-call orthopedic surgeon Dr. Marcelino Scot who will be seeing patient in consult empiric antibiotics have been started after blood cultures were sent.  Patient's blood pressure was in the low normal initially improved with fluids.  Review of Systems: As per HPI, rest all negative.   Past Medical History:  Diagnosis Date  . Aortic valve regurgitation 10/16/2018   Echo 07/28/2017:  EF  60-65, moderate AI, aortic root and ascending aorta mildly dilated (40 mm), ascending aorta 43 mm, MAC, mild MR, mild LAE, moderate RV enlargement, trivial TR // Echo 12/19: EF 60-65, normal wall motion, mild AI, moderate BAE, ascending aorta 43 mm, aortic root 39 mm  . BPH (benign prostatic hypertrophy)   . Chronic anticoagulation   . Chronic atrial fibrillation   . Diastolic dysfunction October 2010   Normal LV systolic function  . Hx SBO   . Hyperlipidemia   . Hypertension   . Small bowel obstruction [facility])     Past Surgical History:  Procedure Laterality Date  . APPENDECTOMY  2004  . CARDIOVASCULAR STRESS TEST  09/30/2007   EF 67%  No ischemia.   Marland Kitchen CARDIOVERSION  05/01/2004  . SMALL INTESTINE SURGERY  2004  . US ECHOCARDIOGRAPHY  09/05/2009   ef 55-60%     reports that he has never smoked. He has never used smokeless tobacco. He reports that he does not drink alcohol or use drugs.  No Known Allergies  Family History  Problem Relation Age of Onset  . Heart attack Mother   . Hypertension Mother   . Diabetes Father   . Heart disease Father   . Colon cancer Neg Hx   . Esophageal cancer Neg Hx   . Pancreatic cancer Neg Hx   . Prostate cancer Neg Hx   . Rectal cancer Neg Hx   . Stomach cancer Neg Hx     Prior to Admission medications  Medication Sig Start Date End Date Taking? Authorizing Provider  Acetylcysteine (NAC 600 PO) Take 1 tablet by mouth at bedtime.   Yes [provider]  Cholecalciferol (VITAMIN D3 PO) Take 1 tablet by mouth 2 (two) times daily.   Yes [provider]  diltiazem (CARDIZEM CD) 360 MG 24 hr capsule Take 1 capsule (360 mg total) by mouth daily. 12/23/18  Yes Larey Dresser, MD  donepezil (ARICEPT) 10 MG tablet Take 1 tablet (10 mg total) by mouth daily. 01/05/19  Yes Cottle, Billey Co., MD  finasteride (PROSCAR) 5 MG tablet Take 5 mg by mouth every morning.   Yes [provider]  lithium carbonate 150 MG capsule Take 1  capsule (150 mg total) by mouth at bedtime. 01/19/19  Yes Cottle, Billey Co., MD  Multiple Vitamins-Minerals (ICAPS AREDS 2 PO) Take 1 tablet by mouth daily.   Yes [provider]  Omega-3 Fatty Acids (OMEGA-3 FISH OIL PO) Take 1 tablet by mouth at bedtime.   Yes [provider]  warfarin (COUMADIN) 5 MG tablet TAKE AS DIRECTED BY COUMADIN CLINIC Patient taking differently: Take 2.5-5 mg by mouth See admin instructions. Take 1 tablet by mouth on Monday- Saturday  Take 1/2 tablet by mouth on Sunday 01/01/19  Yes Larey Dresser, MD  lactose free nutrition (BOOST PLUS) LIQD Take 237 mLs by mouth 3 (three) times daily between meals. 07/20/13   Marton Redwood, MD    Physical Exam: Vitals:   02/09/19 0036 02/09/19 0130 02/09/19 0200 02/09/19 0205  BP: 112/66 1_0  Pulse: (!) 104   97  Resp: 14 (!) _1 Temp:      TempSrc:      SpO2: 99%   99%  Weight:      Height:          Constitutional: Moderately built and nourished. Vitals:   02/09/19 0036 02/09/19 0130 02/09/19 0200 02/09/19 0205  BP: 112/66 1_2  Pulse: (!) 104   97  Resp: 14 (!) _3 Temp:      TempSrc:      SpO2: 99%   99%  Weight:      Height:       Eyes: Anicteric no pallor. ENMT: No discharge from the ears eyes nose and mouth. Neck: No mass felt.  No neck rigidity. Respiratory: No rhonchi or crepitations. Cardiovascular: S1-S2 heard. Abdomen: Soft nontender bowel sounds present. Musculoskeletal: Left knee swelling tender. Skin: Mildly erythematous left knee. Neurologic: Alert awake oriented to his name and place.  Moves all extremities. Psychiatric: Oriented to his name and place.   Labs on Admission: I have personally reviewed following labs and imaging studies  CBC: Recent Labs  Lab 02/08/19 1401  WBC 18.4*  NEUTROABS 16.3*  HGB 10.8*  HCT 33.7*  MCV 88.2  PLT 623   Basic Metabolic Panel: Recent Labs  Lab 02/08/19 1401  NA 133*  K 3.7  CL  99  CO2 22  GLUCOSE 138*  BUN 26*  CREATININE 1.17  CALCIUM 8.9   GFR: Estimated Creatinine Clearance: 64.1 mL/min (by C-G formula based on SCr of 1.17 mg/dL). Liver Function Tests: Recent Labs  Lab 02/08/19 1401  AST 26  ALT 22  ALKPHOS 97  BILITOT 1.9*  PROT 7.6  ALBUMIN 2.7*   No results for input(s): LIPASE, AMYLASE in the last 168 hours. No results for input(s): AMMONIA in the last 168 hours. Coagulation Profile: Recent  Labs  Lab 02/08/19 1401  INR 4.3*   Cardiac Enzymes: No results for input(s): CKTOTAL, CKMB, CKMBINDEX, TROPONINI in the last 168 hours. BNP (last 3 results) No results for input(s): PROBNP in the last 8760 hours. HbA1C: No results for input(s): HGBA1C in the last 72 hours. CBG: Recent Labs  Lab 02/08/19 1410  GLUCAP 118*   Lipid Profile: No results for input(s): CHOL, HDL, LDLCALC, TRIG, CHOLHDL, LDLDIRECT in the last 72 hours. Thyroid Function Tests: No results for input(s): TSH, T4TOTAL, FREET4, T3FREE, THYROIDAB in the last 72 hours. Anemia Panel: No results for input(s): VITAMINB12, FOLATE, FERRITIN, TIBC, IRON, RETICCTPCT in the last 72 hours. Urine analysis:    Component Value Date/Time   COLORURINE YELLOW 02/08/2019 1820   APPEARANCEUR CLEAR 02/08/2019 1820   LABSPEC 1.029 02/08/2019 1820   PHURINE 5.0 02/08/2019 1820   GLUCOSEU NEGATIVE 02/08/2019 1820   HGBUR LARGE (A) 02/08/2019 1820   BILIRUBINUR NEGATIVE 02/08/2019 1820   KETONESUR NEGATIVE 02/08/2019 1820   PROTEINUR 30 (A) 02/08/2019 1820   NITRITE NEGATIVE 02/08/2019 1820   LEUKOCYTESUR NEGATIVE 02/08/2019 1820   Sepsis Labs: _0 (procalcitonin:4,lacticidven:4) )No results found for this or any previous visit (from the past 240 hour(s)).   Radiological Exams on Admission: Ct Head Wo Contrast  Result Date: 02/08/2019 CLINICAL DATA:  Altered level of consciousness EXAM: CT HEAD WITHOUT CONTRAST TECHNIQUE: Contiguous axial images were obtained from the base of  the skull through the vertex without intravenous contrast. COMPARISON:  None. FINDINGS: Brain: No evidence of acute infarction, hemorrhage, extra-axial collection, ventriculomegaly, or mass effect. Generalized cerebral atrophy. Periventricular white matter low attenuation likely secondary to microangiopathy. Vascular: Cerebrovascular atherosclerotic calcifications are noted. Skull: Negative for fracture or focal lesion. Sinuses/Orbits: Visualized portions of the orbits are unremarkable. Visualized portions of the paranasal sinuses are unremarkable. Visualized portions of the mastoid air cells are unremarkable. Other: None. IMPRESSION: 1. No acute intracranial pathology. 2. Chronic microvascular disease and cerebral atrophy. Electronically Signed   By: Kathreen Devoid   On: 02/08/2019 16:18   Ct Abdomen Pelvis W Contrast  Result Date: 02/08/2019 CLINICAL DATA:  Abdominal pain EXAM: CT ABDOMEN AND PELVIS WITH CONTRAST TECHNIQUE: Multidetector CT imaging of the abdomen and pelvis was performed using the standard protocol following bolus administration of intravenous contrast. CONTRAST:  116m OMNIPAQUE IOHEXOL 300 MG/ML  SOLN COMPARISON:  07/08/2013 FINDINGS: LOWER CHEST: Enlarged left atrium. No pleural effusion. Right basilar atelectasis. HEPATOBILIARY: The hepatic contours and density are normal. There is no intra- or extrahepatic biliary dilatation. There is cholelithiasis without acute inflammation. Focal fatty deposition along the gallbladder fossa. PANCREAS: The pancreatic parenchymal contours are normal and there is no ductal dilatation. There is no peripancreatic fluid collection. SPLEEN: Normal. ADRENALS/URINARY TRACT: --Adrenal glands: Normal. --Right kidney/ureter: No hydronephrosis, nephroureterolithiasis, perinephric stranding or solid renal mass. --Left kidney/ureter: No hydronephrosis, nephroureterolithiasis, perinephric stranding or solid renal mass. --Urinary bladder: Normal for degree of distention  STOMACH/BOWEL: --Stomach/Duodenum: There is no hiatal hernia or other gastric abnormality. The duodenal course and caliber are normal. --Small bowel: No dilatation or inflammation. --Colon: No focal abnormality. --Appendix: Not visualized. No right lower quadrant inflammation or free fluid. VASCULAR/LYMPHATIC: There is aortic atherosclerosis without hemodynamically significant stenosis. The portal vein, splenic vein, superior mesenteric vein and IVC are patent. No abdominal or pelvic lymphadenopathy. REPRODUCTIVE: Enlarged prostate measures 7.8 cm in transverse dimension. MUSCULOSKELETAL. 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 OTHER: None. IMPRESSION: 1. No acute abnormality of the abdomen  or pelvis. 2. Cholelithiasis without acute inflammation. 3. Markedly enlarged prostate. 4. 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 Electronically Signed   By: Ulyses Jarred M.D.   On: 02/08/2019 16:58   Dg Chest Port 1 View  Result Date: 02/08/2019 CLINICAL DATA:  Fever, altered mental status, leukocytosis. EXAM: PORTABLE CHEST 1 VIEW COMPARISON:  Chest x-ray dated July 10, 2015. FINDINGS: The heart size and mediastinal contours are within normal limits. Atherosclerotic calcification of the aortic arch. Normal pulmonary vascularity. No focal consolidation, pleural effusion, or pneumothorax. No acute osseous abnormality. IMPRESSION: No active disease. Electronically Signed   By: Titus Dubin M.D.   On: 02/08/2019 14:23   Dg Knee Complete 4 Views Left  Result Date: 02/08/2019 CLINICAL DATA:  Pain in both legs.  Elevated white count and fever. EXAM: LEFT KNEE - COMPLETE 4+ VIEW COMPARISON:  12/20/2009 FINDINGS: Small suprapatellar joint effusion identified. There is anterior soft tissue swelling with edema subcutaneous anterior to the knee. Interval avulsion of the tibial tuberosity with possible osteolysis  involving the undersurface of the lower pole of patella and tibial tubercle. Mild tricompartment osteoarthritis and chondrocalcinosis noted. IMPRESSION: 1. There has been interval avulsion of the tibial tubercle with suspected osteolysis involving the lower pole of patella and tibial tubercle. Overlying soft tissue swelling and small joint effusion is noted. Cannot rule out underlying osteomyelitis. Recommend further evaluation with contrast enhanced MR of the knee. Electronically Signed   By: Kerby Moors M.D.   On: 02/08/2019 19:53    EKG: Independently reviewed.  A. fib with RVR.  Assessment/Plan Active Problems:   Atrial fibrillation, chronic   HTN (hypertension)   SIRS (systemic inflammatory response syndrome) (HCC)   Osteomyelitis (HCC)   Arthritis, septic, knee (HCC)   Acute blood loss anemia    1. SIRS with possible developing sepsis likely source could be left knee -Dr. Marcelino Scot on-call orthopedic surgeon has been consulted.  Arthrocentesis been done.  Blood cultures have been sent.  No definite signs of any respiratory tract infection or UTI.  Continue empiric antibiotics for now. 2. Anemia -Per family patient has dropped 4 g from previous.  Will closely follow CBC.  Check stool for occult blood.  There was some blood in the arthrocentesis fluid.  Will hold Coumadin for now patient's wife is agreeable. 3. Coagulopathy secondary Coumadin Coumadin on hold secondary to possible need for procedure and likely blood loss. 4. A. fib with RVR improved with fluids.  Holding off rate limiting medication due to low normal blood pressure and possible sepsis.  Coumadin on hold after discussing with patient's wife because of possible blood loss and possible need for procedures.  Patient wife is in agreement pain. 5. Bipolar on lithium.  Lithium levels are normal. 6. Cognitive impairment.  Mental status appears to have come back to baseline.   DVT prophylaxis: SCDs. Code Status: Full code as  discussed with patient's wife. Family Communication: Patient's wife. Disposition Plan: To be determined. Consults called: Orthopedic surgery. Admission status: Inpatient.   Rise Patience MD [facility] Pager [phone].  If 7PM-7AM, please contact night-coverage [email or web address] Password [facility]  02/09/2019, 2:29 AM

## 2019-02-10 ENCOUNTER — Encounter (HOSPITAL_COMMUNITY): Payer: Self-pay | Admitting: Orthopedic Surgery

## 2019-02-10 DIAGNOSIS — S86812A Strain of other muscle(s) and tendon(s) at lower leg level, left leg, initial encounter: Secondary | ICD-10-CM

## 2019-02-10 HISTORY — DX: Strain of other muscle(s) and tendon(s) at lower leg level, left leg, initial encounter: S86.812A

## 2019-02-10 LAB — CBC
HCT: 26.9 % — ABNORMAL LOW (ref 39.0–52.0)
Hemoglobin: 8.8 g/dL — ABNORMAL LOW (ref 13.0–17.0)
MCH: 28.3 pg (ref 26.0–34.0)
MCHC: 32.7 g/dL (ref 30.0–36.0)
MCV: 86.5 fL (ref 80.0–100.0)
Platelets: 276 10*3/uL (ref 150–400)
RBC: 3.11 MIL/uL — ABNORMAL LOW (ref 4.22–5.81)
RDW: 14.4 % (ref 11.5–15.5)
WBC: 12.5 10*3/uL — ABNORMAL HIGH (ref 4.0–10.5)
nRBC: 0 % (ref 0.0–0.2)

## 2019-02-10 LAB — COMPREHENSIVE METABOLIC PANEL
ALT: 19 U/L (ref 0–44)
AST: 19 U/L (ref 15–41)
Albumin: 1.9 g/dL — ABNORMAL LOW (ref 3.5–5.0)
Alkaline Phosphatase: 74 U/L (ref 38–126)
Anion gap: 8 (ref 5–15)
BUN: 19 mg/dL (ref 8–23)
CO2: 23 mmol/L (ref 22–32)
CREATININE: 0.96 mg/dL (ref 0.61–1.24)
Calcium: 8.3 mg/dL — ABNORMAL LOW (ref 8.9–10.3)
Chloride: 106 mmol/L (ref 98–111)
GFR calc Af Amer: >60 mL/min (ref >60)
GFR calc non Af Amer: >60 mL/min (ref >60)
GLUCOSE: 112 mg/dL — AB (ref 70–99)
Potassium: 3.6 mmol/L (ref 3.5–5.1)
Sodium: 137 mmol/L (ref 135–145)
Total Bilirubin: 1.4 mg/dL — ABNORMAL HIGH (ref 0.3–1.2)
Total Protein: 6.1 g/dL — ABNORMAL LOW (ref 6.5–8.1)

## 2019-02-10 LAB — PROTEIN, BODY FLUID (OTHER)

## 2019-02-10 LAB — GLUCOSE, BODY FLUID OTHER

## 2019-02-10 LAB — MAGNESIUM: Magnesium: 1.8 mg/dL (ref 1.7–2.4)

## 2019-02-10 LAB — PREALBUMIN: Prealbumin: <5 mg/dL — ABNORMAL LOW (ref 18–38)

## 2019-02-10 LAB — MRSA PCR SCREENING: MRSA by PCR: NEGATIVE

## 2019-02-10 LAB — TSH: TSH: 4.073 u[IU]/mL (ref 0.350–4.500)

## 2019-02-10 LAB — HEPARIN LEVEL (UNFRACTIONATED): Heparin Unfractionated: <0.1 IU/mL — ABNORMAL LOW (ref 0.30–0.70)

## 2019-02-10 LAB — PROTIME-INR
INR: 1.6 — ABNORMAL HIGH (ref 0.8–1.2)
Prothrombin Time: 18.8 seconds — ABNORMAL HIGH (ref 11.4–15.2)

## 2019-02-10 MED ORDER — CHLORHEXIDINE GLUCONATE 4 % EX LIQD
60.0000 mL | Freq: Once | CUTANEOUS | Status: AC
  Administered 2019-02-11: 4 via TOPICAL
  Filled 2019-02-10: qty 60

## 2019-02-10 MED ORDER — HEPARIN (PORCINE) 25000 UT/250ML-% IV SOLN
1500.0000 [IU]/h | INTRAVENOUS | Status: DC
  Administered 2019-02-10: 1200 [IU]/h via INTRAVENOUS
  Filled 2019-02-10: qty 250

## 2019-02-10 MED ORDER — ADULT MULTIVITAMIN W/MINERALS CH
1.0000 | ORAL_TABLET | Freq: Every day | ORAL | Status: DC
  Administered 2019-02-12 – 2019-02-15 (×4): 1 via ORAL
  Filled 2019-02-10 (×4): qty 1

## 2019-02-10 MED ORDER — POVIDONE-IODINE 10 % EX SWAB
2.0000 "application " | Freq: Once | CUTANEOUS | Status: AC
  Administered 2019-02-11: 2 via TOPICAL

## 2019-02-10 MED ORDER — ACETAMINOPHEN 500 MG PO TABS
1000.0000 mg | ORAL_TABLET | Freq: Once | ORAL | Status: AC
  Administered 2019-02-10: 1000 mg via ORAL
  Filled 2019-02-10: qty 2

## 2019-02-10 NOTE — Progress Notes (Signed)
PROGRESS NOTE    Russell Dillon  QJJ:[id number] DOB: [birth year] DOA: 02/08/2019 PCP: Marton Redwood, MD   Brief Narrative:  [phone] w/ a hx of cognitive impairment, atrial fibrillation, and bipolar disorder who was found to be increasingly weak with increasing pain in his left knee which progressed to the point he was not able to walk. He has had a chronic left knee problem. He visited his PCP's office where a CBC noted a 4 g drop in hemoglobin. He was advised to come to the ER.     In the ER the patient was mildly confused and had a temperature of 101 F w/ leukocytosis.  On exam patient's left knee was tender to touch.  Chest x-ray was unremarkable.  UA did not show any signs of infection.  MRI of the left knee was done which shows features concerning for osteomyelitis and septic arthritis of the left knee.  Arthrocentesis been done after discussing with on-call orthopedic surgeon Dr. Marcelino Scot who will be seeing patient in consult empiric antibiotics have been started after blood cultures were sent.  Patient's blood pressure was in the low normal initially improved with fluids.    Subjective: 4/1 patient awake, follows all commands pleasantly confused.  Attempts to answer questions (circumferential).   Assessment & Plan:   Active Problems:   Atrial fibrillation, chronic   HTN (hypertension)   SIRS (systemic inflammatory response syndrome) (HCC)   Osteomyelitis (HCC)   Arthritis, septic, knee (HCC)   Acute blood loss anemia   Left patella avulsion fracture from tibial tuberosity/medial and lateral meniscal tear/ -Scheduled for surgical repair this week  SIRS vs Sepsis unknown etiology - Continue current antibiotics.  Per orthopedics (joint aspirate) joint not septic however given patient scheduled for surgery later this week and improvement with current antibiotic regimen will leave up to orthopedic surgery to discontinue antibiotics when they are comfortable. -Afebrile last 24 hours,  although white count still elevated could be secondary to demargination with his acute fractures  Anemia -Per family 4 gram drop in hemoglobin - Anemia panel pending - Occult blood pending - Elevated INR, 6.2 on admission, warfarin discontinued - Received dose of vitamin K - Today INR<2 started on heparin per pharmacy will discontinue 12 hours prior to surgery. -Monitor closely for bleeding.  A. fib with RVR - On Coumadin at home, see anemia - Diltiazem 360 mg daily -   DVT prophylaxis: Heparin per pharmacy Code Status: Full Family Communication: None Disposition Plan: Per orthopedic surgery: Most likely will require SNF   Consultants:  Orthopedic trauma specialist   Procedures/Significant Events:  None   I have personally reviewed and interpreted all radiology studies and my findings are as above.  VENTILATOR SETTINGS: None   Cultures 3/31 LEFT knee synovial fluid negative x1 day  Antimicrobials: Anti-infectives (From admission, onward)   Start     Ordered Stop   02/09/19 1000  ceFEPIme (MAXIPIME) 1 g in sodium chloride 0.9 % 100 mL IVPB     02/09/19 0239     02/09/19 0600  vancomycin (VANCOCIN) IVPB 750 mg/150 ml premix     02/09/19 0239     02/09/19 0300  metroNIDAZOLE (FLAGYL) IVPB 500 mg     02/09/19 0230     02/09/19 0245  ceFEPIme (MAXIPIME) 2 g in sodium chloride 0.9 % 100 mL IVPB     02/09/19 0230 02/09/19 0357   02/09/19 0245  vancomycin (VANCOCIN) IVPB 1000 mg/200 mL premix  Status:  Discontinued  02/09/19 0230 02/09/19 0230   02/08/19 1615  vancomycin (VANCOCIN) IVPB 1000 mg/200 mL premix     02/08/19 1612 02/08/19 1914   02/08/19 1615  piperacillin-tazobactam (ZOSYN) IVPB 3.375 g     02/08/19 1612 02/08/19 1856       Devices    LINES / TUBES:      Continuous Infusions:  ceFEPime (MAXIPIME) IV 1 g (02/10/19 1038)   heparin 1,200 Units/hr (02/10/19 1047)   metronidazole 500 mg (02/10/19 0849)   vancomycin 750 mg (02/10/19  0641)     Objective: Vitals:   02/10/19 0425 02/10/19 0500 02/10/19 0747 02/10/19 1203  BP: 122/72  109/75 131/63  Pulse: (!) 102  98 89  Resp: 17  17 20   Temp: 99.5 F (37.5 C)  98.3 F (36.8 C) 98.2 F (36.8 C)  TempSrc: Oral  Oral Oral  SpO2: 97%  96% 98%  Weight:  93.3 kg    Height:        Intake/Output Summary (Last 24 hours) at 02/10/2019 1301 Last data filed at 02/10/2019 0427 Gross per 24 hour  Intake 1238.83 ml  Output 1725 ml  Net -486.17 ml   Filed Weights   02/08/19 1402 02/09/19 0812 02/10/19 0500  Weight: 93.4 kg 89.3 kg 93.3 kg    Examination:  General: A/O x3 (does not know why) no acute respiratory distress Eyes: negative scleral hemorrhage, negative anisocoria, negative icterus ENT: Negative Runny nose, negative gingival bleeding, Neck:  Negative scars, masses, torticollis, lymphadenopathy, JVD Lungs: Clear to auscultation bilaterally without wheezes or crackles Cardiovascular: Regular rate and rhythm without murmur gallop or rub normal S1 and S2 Abdomen: negative abdominal pain, nondistended, positive soft, bowel sounds, no rebound, no ascites, no appreciable mass Extremities: No significant cyanosis, clubbing, L LE and a soft binder.  Did not take down, negative pain per patient. Skin: Negative rashes, lesions, ulcers Psychiatric:  Negative depression, negative anxiety, negative fatigue, negative mania, per RN positive dementia at baseline Central nervous system:  Cranial nerves II through XII intact, tongue/uvula midline, all extremities muscle strength 5/5, sensation intact throughout,  negative dysarthria, negative expressive aphasia, negative receptive aphasia.  .     Data Reviewed: Care during the described time interval was provided by me .  I have reviewed this patient's available data, including medical history, events of note, physical examination, and all test results as part of my evaluation.   CBC: Recent Labs  Lab 02/08/19 1401  02/09/19 0241 02/09/19 0524 02/09/19 0953 02/10/19 0302  WBC 18.4* 15.4* 12.1* 12.8* 12.5*  NEUTROABS 16.3*  --   --   --   --   HGB 10.8* 10.3* 9.9* 9.5* 8.8*  HCT 33.7* 31.3* 31.8* 29.1* 26.9*  MCV 88.2 86.0 87.8 86.6 86.5  PLT 299 259 268 275 196   Basic Metabolic Panel: Recent Labs  Lab 02/08/19 1401 02/09/19 0524 02/10/19 0302  NA 133* 136 137  K 3.7 3.3* 3.6  CL 99 106 106  CO2 22 21* 23  GLUCOSE 138* 114* 112*  BUN 26* 21 19  CREATININE 1.17 0.87 0.96  CALCIUM 8.9 8.4* 8.3*  MG  --   --  1.8   GFR: Estimated Creatinine Clearance: 77.4 mL/min (by C-G formula based on SCr of 0.96 mg/dL). Liver Function Tests: Recent Labs  Lab 02/08/19 1401 02/09/19 0524 02/10/19 0302  AST 26 17 19   ALT 22 18 19   ALKPHOS 97 77 74  BILITOT 1.9* 1.3* 1.4*  PROT 7.6 6.4* 6.1*  ALBUMIN 2.7* 2.1* 1.9*   No results for input(s): LIPASE, AMYLASE in the last 168 hours. No results for input(s): AMMONIA in the last 168 hours. Coagulation Profile: Recent Labs  Lab 02/08/19 1401 02/09/19 0953 02/10/19 0302  INR 4.3* 6.2* 1.6*   Cardiac Enzymes: No results for input(s): CKTOTAL, CKMB, CKMBINDEX, TROPONINI in the last 168 hours. BNP (last 3 results) No results for input(s): PROBNP in the last 8760 hours. HbA1C: No results for input(s): HGBA1C in the last 72 hours. CBG: Recent Labs  Lab 02/08/19 1410  GLUCAP 118*   Lipid Profile: No results for input(s): CHOL, HDL, LDLCALC, TRIG, CHOLHDL, LDLDIRECT in the last 72 hours. Thyroid Function Tests: Recent Labs    02/10/19 0302  TSH 4.073   Anemia Panel: No results for input(s): VITAMINB12, FOLATE, FERRITIN, TIBC, IRON, RETICCTPCT in the last 72 hours. Urine analysis:    Component Value Date/Time   COLORURINE YELLOW 02/08/2019 1820   APPEARANCEUR CLEAR 02/08/2019 1820   LABSPEC 1.029 02/08/2019 1820   PHURINE 5.0 02/08/2019 1820   GLUCOSEU NEGATIVE 02/08/2019 1820   HGBUR LARGE (A) 02/08/2019 1820   [place]  NEGATIVE 02/08/2019 [place] 02/08/2019 1820   PROTEINUR 30 (A) 02/08/2019 1820   NITRITE NEGATIVE 02/08/2019 1820   LEUKOCYTESUR NEGATIVE 02/08/2019 1820   Sepsis Labs: @LABRCNTIP (procalcitonin:4,lacticidven:4)  ) Recent Results (from the past 240 hour(s))  Blood culture (routine x 2)     Status: None (Preliminary result)   Collection Time: 02/08/19  2:00 PM  Result Value Ref Range Status   Specimen Description BLOOD LEFT FOREARM  Final   Special Requests   Final    BOTTLES DRAWN AEROBIC AND ANAEROBIC Blood Culture adequate volume   Culture   Final    NO GROWTH 2 DAYS Performed at [facility], [place] [address], [place], [place] [address]    Report Status PENDING  Incomplete  Blood culture (routine x 2)     Status: None (Preliminary result)   Collection Time: 02/08/19  4:00 PM  Result Value Ref Range Status   Specimen Description BLOOD RIGHT FOREARM  Final   Special Requests   Final    BOTTLES DRAWN AEROBIC AND ANAEROBIC Blood Culture adequate volume   Culture   Final    NO GROWTH 2 DAYS Performed at [facility], [place] [address], [place], [place] [address]    Report Status PENDING  Incomplete  Urine culture     Status: None   Collection Time: 02/08/19  6:27 PM  Result Value Ref Range Status   Specimen Description URINE, RANDOM  Final   Special Requests unknown Normal  Final   Culture   Final    NO GROWTH Performed at [facility], [address], [place], [place] [address]    Report Status 02/09/2019 FINAL  Final  Body fluid culture     Status: None (Preliminary result)   Collection Time: 02/09/19  1:43 AM  Result Value Ref Range Status   Specimen Description FLUID SYNOVIAL LEFT KNEE  Final   Special Requests SYRINGE  Final   Gram Stain   Final    ABUNDANT WBC PRESENT, PREDOMINANTLY PMN NO ORGANISMS SEEN    Culture   Final    NO GROWTH 1 DAY Performed at [facility], [place] [address] [place], [place] [address]    Report  Status PENDING  Incomplete         Radiology Studies: Ct Head Wo  Contrast  Result Date: 02/08/2019 CLINICAL DATA:  Altered level of consciousness EXAM: CT HEAD WITHOUT CONTRAST TECHNIQUE: Contiguous axial images were obtained from the base of the skull through the vertex without intravenous contrast. COMPARISON:  None. FINDINGS: Brain: No evidence of acute infarction, hemorrhage, extra-axial collection, ventriculomegaly, or mass effect. Generalized cerebral atrophy. Periventricular white matter low attenuation likely secondary to microangiopathy. Vascular: Cerebrovascular atherosclerotic calcifications are noted. Skull: Negative for fracture or focal lesion. Sinuses/Orbits: Visualized portions of the orbits are unremarkable. Visualized portions of the paranasal sinuses are unremarkable. Visualized portions of the mastoid air cells are unremarkable. Other: None. IMPRESSION: 1. No acute intracranial pathology. 2. Chronic microvascular disease and cerebral atrophy. Electronically Signed   By: Kathreen Devoid   On: 02/08/2019 16:18   Ct Abdomen Pelvis W Contrast  Result Date: 02/08/2019 CLINICAL DATA:  Abdominal pain EXAM: CT ABDOMEN AND PELVIS WITH CONTRAST TECHNIQUE: Multidetector CT imaging of the abdomen and pelvis was performed using the standard protocol following bolus administration of intravenous contrast. CONTRAST:  185mL OMNIPAQUE IOHEXOL 300 MG/ML  SOLN COMPARISON:  07/08/2013 FINDINGS: LOWER CHEST: Enlarged left atrium. No pleural effusion. Right basilar atelectasis. HEPATOBILIARY: The hepatic contours and density are normal. There is no intra- or extrahepatic biliary dilatation. There is cholelithiasis without acute inflammation. Focal fatty deposition along the gallbladder fossa. PANCREAS: The pancreatic parenchymal contours are normal and there is no ductal dilatation. There is no peripancreatic fluid collection. SPLEEN: Normal. ADRENALS/URINARY TRACT: --Adrenal glands: Normal. --Right  kidney/ureter: No hydronephrosis, nephroureterolithiasis, perinephric stranding or solid renal mass. --Left kidney/ureter: No hydronephrosis, nephroureterolithiasis, perinephric stranding or solid renal mass. --Urinary bladder: Normal for degree of distention STOMACH/BOWEL: --Stomach/Duodenum: There is no hiatal hernia or other gastric abnormality. The duodenal course and caliber are normal. --Small bowel: No dilatation or inflammation. --Colon: No focal abnormality. --Appendix: Not visualized. No right lower quadrant inflammation or free fluid. VASCULAR/LYMPHATIC: There is aortic atherosclerosis without hemodynamically significant stenosis. The portal vein, splenic vein, superior mesenteric vein and IVC are patent. No abdominal or pelvic lymphadenopathy. REPRODUCTIVE: Enlarged prostate measures 7.8 cm in transverse dimension. MUSCULOSKELETAL. 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 OTHER: None. IMPRESSION: 1. No acute abnormality of the abdomen or pelvis. 2. Cholelithiasis without acute inflammation. 3. Markedly enlarged prostate. 4. 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 Electronically Signed   By: Ulyses Jarred M.D.   On: 02/08/2019 16:58   Mr Knee Left W Wo Contrast  Result Date: 02/09/2019 CLINICAL DATA:  He has significant left knee pain, however it appears bruised. He does not remember fall. Wife reports he hit his left knee while at church 2 weeks ago and had abrasion and pain. EXAM: MRI OF THE LEFT KNEE WITHOUT AND WITH CONTRAST TECHNIQUE: Multiplanar, multisequence MR imaging of the left knee was performed both before and after administration of intravenous contrast. CONTRAST:  9 mL Gadavist COMPARISON:  None. FINDINGS: MENISCI Medial meniscus: Horizontal tear of the posterior horn of the medial meniscus extend to the free edge. Horizontal tear of the anterior horn-body junction of medial  meniscus extending to the superior articular surface. Lateral meniscus: Complex tear of the anterior horn of lateral meniscus. LIGAMENTS Cruciates: Intact ACL. Mild increased signal in the PCL concerning for PCL strain. Collaterals: Medial collateral ligament is intact. Lateral collateral ligament complex is intact. CARTILAGE Patellofemoral: Partial-thickness cartilage loss of the patellofemoral compartment with subchondral reactive marrow changes in the patellar apex. Medial: Partial-thickness cartilage loss  of medial femorotibial compartment. Lateral: Mild partial-thickness cartilage loss of the lateral femorotibial compartment. Joint: Moderate joint effusion with areas of T1 hyperintensity likely reflecting hemarthrosis. Smooth synovial enhancement. Edema in Hoffa's fat. Popliteal Fossa: Small hemorrhagic Baker's cyst. Intact popliteus tendon. Extensor Mechanism: Intact quadriceps tendon. Severe thickening and increased signal of the patellar tendon most consistent with severe tendinosis with a high-grade partial thickness, near complete, tear at the patellar tendon insertion with a few medial fibers remaining intact and a 1.4 x 3.6 cm hematoma at the patellar tendon insertion. Avulsed bony fragment is noted along the distal and of the patellar tendon tear avulsed from the tibial tuberosity. Intact medial patellar retinaculum. Intact lateral patellar retinaculum. Intact MPFL. Bones: Severe marrow edema in the anterolateral tibial plateau with cortical irregularity concerning for a nondisplaced fracture which is adjacent to the avulsed tibial tubercle. Marrow edema in the anterolateral femoral condyle with peripheral cortical disruption concerning for a nondisplaced fracture. Adjacent to the area of cortical disruption there is a 14 mm fluid collection peripherally deep to the fibular collateral ligament and adjacent to the popliteus tendon concerning for a small hematoma. Other: Mild muscle edema in the medial  gastrocnemius muscle and to lesser extent lateral gastrocnemius muscle most concerning for mild muscle strain with a small 11 mm intramuscular hematoma in the medial gastrocnemius muscle. Diffuse soft tissue edema around the knee within the subcutaneous fat likely reactive versus less likely secondary to cellulitis. Prepatellar soft tissue edema with a thin intermediate density fluid collection likely reflecting hemorrhagic bursitis. IMPRESSION: 1. Severe marrow edema in the anterolateral tibial plateau with cortical irregularity concerning for a nondisplaced fracture which is adjacent to the avulsed tibial tubercle. Marrow edema in the anterolateral femoral condyle with peripheral cortical disruption concerning for a nondisplaced fracture. Adjacent to the area of cortical disruption of the lateral femoral condyle there is a 14 mm fluid collection peripherally deep to the fibular collateral ligament and adjacent to the popliteus tendon concerning for a small hematoma. This overall appearance can be seen with hyperextension injury versus direct trauma. 2. Mild increased signal in the PCL concerning for PCL strain. 3. Moderate size hemarthrosis with smooth synovial enhancement. This is likely posttraumatic and inflammatory in nature, but secondarily infected fluid can not be excluded. 4. Severe tendinosis of the patellar tendon most consistent with with a high-grade partial thickness, near complete, tear at the patellar tendon insertion with a few medial fibers remaining intact and a 1.4 x 3.6 cm hematoma at the patellar tendon insertion. Avulsed bony fragment is noted along the distal and of the patellar tendon tear avulsed from the tibial tuberosity. This appearance can be seen in the setting of gout. 5. Prepatellar soft tissue edema with a thin intermediate density fluid collection likely reflecting hemorrhagic bursitis. 6. Tricompartmental cartilage abnormalities as described above most consistent with mild  osteoarthritis. 7. Horizontal tear of the posterior horn of the medial meniscus extend to the free edge. Horizontal tear of the anterior horn-body junction of medial meniscus extending to the superior articular surface. 8. Complex tear of the anterior horn of lateral meniscus. Electronically Signed   By: Kathreen Devoid   On: 02/09/2019 08:24   Dg Chest Port 1 View  Result Date: 02/08/2019 CLINICAL DATA:  Fever, altered mental status, leukocytosis. EXAM: PORTABLE CHEST 1 VIEW COMPARISON:  Chest x-ray dated July 10, 2015. FINDINGS: The heart size and mediastinal contours are within normal limits. Atherosclerotic calcification of the aortic arch. Normal pulmonary vascularity. No focal consolidation,  pleural effusion, or pneumothorax. No acute osseous abnormality. IMPRESSION: No active disease. Electronically Signed   By: Titus Dubin M.D.   On: 02/08/2019 14:23   Dg Knee Complete 4 Views Left  Result Date: 02/08/2019 CLINICAL DATA:  Pain in both legs.  Elevated white count and fever. EXAM: LEFT KNEE - COMPLETE 4+ VIEW COMPARISON:  12/20/2009 FINDINGS: Small suprapatellar joint effusion identified. There is anterior soft tissue swelling with edema subcutaneous anterior to the knee. Interval avulsion of the tibial tuberosity with possible osteolysis involving the undersurface of the lower pole of patella and tibial tubercle. Mild tricompartment osteoarthritis and chondrocalcinosis noted. IMPRESSION: 1. There has been interval avulsion of the tibial tubercle with suspected osteolysis involving the lower pole of patella and tibial tubercle. Overlying soft tissue swelling and small joint effusion is noted. Cannot rule out underlying osteomyelitis. Recommend further evaluation with contrast enhanced MR of the knee. Electronically Signed   By: Kerby Moors M.D.   On: 02/08/2019 19:53        Scheduled Meds:  diltiazem  360 mg Oral Daily   donepezil  10 mg Oral Daily   feeding supplement (ENSURE  ENLIVE)  237 mL Oral TID BM   finasteride  5 mg Oral q morning - 10a   lithium carbonate  150 mg Oral QHS   omega-3 acid ethyl esters  1 g Oral QHS   Continuous Infusions:  ceFEPime (MAXIPIME) IV 1 g (02/10/19 1038)   heparin 1,200 Units/hr (02/10/19 1047)   metronidazole 500 mg (02/10/19 0849)   vancomycin 750 mg (02/10/19 0641)     LOS: 1 day    Time spent: 40 minutes    Debany Vantol, Geraldo Docker, MD [facility] Pager [phone]   If 7PM-7AM, please contact night-coverage [email or web address] Password [email or web address] 02/10/2019, 1:01 PM

## 2019-02-10 NOTE — Progress Notes (Signed)
ANTICOAGULATION CONSULT NOTE - Initial Consult  Pharmacy Consult for IV heparin Indication: atrial fibrillation  No Known Allergies  Patient Measurements: Height: _0  (177.8 cm) Weight: 205 lb 11 oz (93.3 kg) IBW/kg (Calculated) : 73 Heparin Dosing Weight: 89 kg  Vital Signs: Temp: 98.3 F (36.8 C) (04/01 0747) Temp Source: Oral (04/01 0747) BP: 109/75 (04/01 0747) Pulse Rate: 98 (04/01 0747)  Labs: Recent Labs    02/08/19 1401  02/09/19 0524 02/09/19 0953 02/10/19 0302  HGB 10.8*   < > 9.9* 9.5* 8.8*  HCT 33.7*   < > 31.8* 29.1* 26.9*  PLT 299   < > 268 275 276  LABPROT 40.7*  --   --  53.8* 18.8*  INR 4.3*  --   --  6.2* 1.6*  CREATININE 1.17  --  0.87  --  0.96   < > = values in this interval not displayed.    Estimated Creatinine Clearance: 77.4 mL/min (by C-G formula based on SCr of 0.96 mg/dL).   Medical History: Past Medical History:  Diagnosis Date  . Aortic valve regurgitation 10/16/2018   Echo 07/28/2017:  EF 60-65, moderate AI, aortic root and ascending aorta mildly dilated (40 mm), ascending aorta 43 mm, MAC, mild MR, mild LAE, moderate RV enlargement, trivial TR // Echo 12/19: EF 60-65, normal wall motion, mild AI, moderate BAE, ascending aorta 43 mm, aortic root 39 mm  . BPH (benign prostatic hypertrophy)   . Chronic anticoagulation   . Chronic atrial fibrillation   . Diastolic dysfunction October 2010   Normal LV systolic function  . Hx SBO   . Hyperlipidemia   . Hypertension   . Small bowel obstruction (HCC)     Medications:  Infusions:  . ceFEPime (MAXIPIME) IV 1 g (02/10/19 0152)  . heparin    . metronidazole 500 mg (02/10/19 0849)  . vancomycin 750 mg (02/10/19 9476)    Assessment: 73 yo male on chronic Coumadin for afib, admitted with supratherapeutic INR.  Today's INR down to 1.6 after vitamin K given yesterday.  Continuing to hold Coumadin for knee surgery later this week.  Pharmacy asked to begin IV heparin.  Hgb with trend  down, Pltc okay.  Goal of Therapy:  Heparin level 0.3-0.7 units/ml Monitor platelets by anticoagulation protocol: Yes   Plan:  1. Start IV heparin 1200 units/hr, no bolus. 2. Check heparin level 8 hrs after gtt starts. 3. Daily heparin level and CBC. 4. F/u plans for resuming Coumadin after OR.  [name], [facility] Clinical Pharmacist Phone [phone]  02/10/2019 10:02 AM

## 2019-02-10 NOTE — Progress Notes (Signed)
[place] for IV heparin Indication: atrial fibrillation  No Known Allergies  Patient Measurements: Height: _0  (177.8 cm) Weight: 205 lb 11 oz (93.3 kg) IBW/kg (Calculated) : 73 Heparin Dosing Weight: 89 kg  Vital Signs: Temp: 98.2 F (36.8 C) (04/01 1203) Temp Source: Oral (04/01 1203) BP: 131/63 (04/01 1203) Pulse Rate: 89 (04/01 1203)  Labs: Recent Labs    02/08/19 1401  02/09/19 0524 02/09/19 0953 02/10/19 0302 02/10/19 1807  HGB 10.8*   < > 9.9* 9.5* 8.8*  --   HCT 33.7*   < > 31.8* 29.1* 26.9*  --   PLT 299   < > 268 275 276  --   LABPROT 40.7*  --   --  53.8* 18.8*  --   INR 4.3*  --   --  6.2* 1.6*  --   HEPARINUNFRC  --   --   --   --   --  <0.10*  CREATININE 1.17  --  0.87  --  0.96  --    < > = values in this interval not displayed.    Estimated Creatinine Clearance: 77.4 mL/min (by C-G formula based on SCr of 0.96 mg/dL).   Medical History: Past Medical History:  Diagnosis Date  . Aortic valve regurgitation 10/16/2018   Echo 07/28/2017:  EF 60-65, moderate AI, aortic root and ascending aorta mildly dilated (40 mm), ascending aorta 43 mm, MAC, mild MR, mild LAE, moderate RV enlargement, trivial TR // Echo 12/19: EF 60-65, normal wall motion, mild AI, moderate BAE, ascending aorta 43 mm, aortic root 39 mm  . BPH (benign prostatic hypertrophy)   . Chronic anticoagulation   . Chronic atrial fibrillation   . Diastolic dysfunction October 2010   Normal LV systolic function  . Hx SBO   . Hyperlipidemia   . Hypertension   . Patellar tendon rupture, left, initial encounter 02/10/2019  . Small bowel obstruction (HCC)     Medications:  Infusions:  . ceFEPime (MAXIPIME) IV 1 g (02/10/19 1714)  . heparin 1,200 Units/hr (02/10/19 1047)  . metronidazole 500 mg (02/10/19 1425)  . vancomycin 750 mg (02/10/19 1822)    Assessment: 73 yo male on chronic Coumadin for afib, admitted with supratherapeutic INR.  Today's INR  down to 1.6 after vitamin K given yesterday.  Continuing to hold Coumadin for knee surgery later this week.  Initial heparin level came back undetectable, on 1200 units/hr. No s/sx of bleeding or infusion issues.   Goal of Therapy:  Heparin level 0.3-0.7 units/ml Monitor platelets by anticoagulation protocol: Yes   Plan:  1. Increase IV heparin to 1500 units/hr. 2. Check heparin level 8 hrs with morning labs. 3. Daily heparin level and CBC. 4. F/u plans for resuming Coumadin after OR.  Antonietta Jewel, PharmD, [place] Clinical Pharmacist  Pager: [phone] Phone: [phone]  02/10/2019 7:26 PM

## 2019-02-10 NOTE — Progress Notes (Signed)
  Speech Language Pathology Treatment: Dysphagia  Patient Details Name: Russell Dillon MRN: [id number] DOB: 1946-03-07 Today's Date: 02/10/2019 Time: [id number] SLP Time Calculation (min) (ACUTE ONLY): 10 min  Assessment / Plan / Recommendation Clinical Impression  SLP provided skilled observation during breakfast meal, although pt declined all solids on his tray. He did attempt to eat the sugar packets because he was not sure what they were, but these were removed from reach. Pt did drink various thin liquids, self-fed, with no overt signs of difficulty. Recommend continuing regular solids and thin liquids. Pt would likely benefit from set-up assist and intermittent supervision during meal. SLP will f/u after surgery to see that his continues to tolerate current diet.   HPI HPI: Pt is a 73 y.o. male admitted due to concern of sepsis; worsening left knee pain and increased swelling. Swallow eval ordered given hx of dementia and pending surgery.   PMH of dementia, hypertension, hyperlipidemia, atrial fibrillation, bipolar disorder, and small bowel obstruction. 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      SLP Plan  Continue with current plan of care       Recommendations  Diet recommendations: Regular;Thin liquid Liquids provided via: Cup;Straw Medication Administration: Whole meds with liquid Supervision: Patient able to self feed;Intermittent supervision to cue for compensatory strategies Compensations: Slow rate;Small sips/bites;Minimize environmental distractions Postural Changes and/or Swallow Maneuvers: Seated upright 90 degrees                Oral Care Recommendations: Oral care BID Follow up Recommendations: None SLP Visit Diagnosis: Dysphagia, unspecified (R13.10) Plan: Continue with current plan of care       GO                Venita Sheffield  Ryson Bacha 02/10/2019, 10:34 AM  Pollyann Glen, M.A. [place] [facility] Environmental education officer [phone] Office [phone]

## 2019-02-10 NOTE — Progress Notes (Signed)
Orthopedic Trauma Service Progress Note  Patient ID: Russell Dillon MRN: [id number] DOB/AGE: [birth year] 73 y.o.  Subjective:  Pleasantly confused Appears comfortable   Spoke with wife yesterday   ROS Difficult to assess   Objective:   VITALS:   Vitals:   02/10/19 0425 02/10/19 0500 02/10/19 0747 02/10/19 1203  BP: 122/72  109/75 131/63  Pulse: (!) 102  98 89  Resp: 17  17 20   Temp: 99.5 F (37.5 C)  98.3 F (36.8 C) 98.2 F (36.8 C)  TempSrc: Oral  Oral Oral  SpO2: 97%  96% 98%  Weight:  93.3 kg    Height:        Estimated body mass index is 29.51 kg/m as calculated from the following:   Height as of this encounter: 5\' 10"  (1.778 m).   Weight as of this encounter: 93.3 kg.   Intake/Output      03/31 0701 - 04/01 0700 04/01 0701 - 04/02 0700   P.O. 300    I.V. (mL/kg) 1619.5 (17.4)    IV Piggyback 396.8    Total Intake(mL/kg) 2316.3 (24.8)    Urine (mL/kg/hr) 2525 (1.1)    Total Output 2525    Net -208.7           LABS  Results for orders placed or performed during the hospital encounter of 02/08/19 (from the past 24 hour(s))  CBC     Status: Abnormal   Collection Time: 02/10/19  3:02 AM  Result Value Ref Range   WBC 12.5 (H) 4.0 - 10.5 K/uL   RBC 3.11 (L) 4.22 - 5.81 MIL/uL   Hemoglobin 8.8 (L) 13.0 - 17.0 g/dL   HCT 26.9 (L) 39.0 - 52.0 %   MCV 86.5 80.0 - 100.0 fL   MCH 28.3 26.0 - 34.0 pg   MCHC 32.7 30.0 - 36.0 g/dL   RDW 14.4 11.5 - 15.5 %   Platelets 276 150 - 400 K/uL   nRBC 0.0 0.0 - 0.2 %  Protime-INR     Status: Abnormal   Collection Time: 02/10/19  3:02 AM  Result Value Ref Range   Prothrombin Time 18.8 (H) 11.4 - 15.2 seconds   INR 1.6 (H) 0.8 - 1.2  Magnesium     Status: None   Collection Time: 02/10/19  3:02 AM  Result Value Ref Range   Magnesium 1.8 1.7 - 2.4 mg/dL  TSH     Status: None   Collection Time: 02/10/19  3:02 AM  Result Value Ref Range    TSH 4.073 0.350 - 4.500 uIU/mL  Comprehensive metabolic panel     Status: Abnormal   Collection Time: 02/10/19  3:02 AM  Result Value Ref Range   Sodium 137 135 - 145 mmol/L   Potassium 3.6 3.5 - 5.1 mmol/L   Chloride 106 98 - 111 mmol/L   CO2 23 22 - 32 mmol/L   Glucose, Bld 112 (H) 70 - 99 mg/dL   BUN 19 8 - 23 mg/dL   Creatinine, Ser 0.96 0.61 - 1.24 mg/dL   Calcium 8.3 (L) 8.9 - 10.3 mg/dL   Total Protein 6.1 (L) 6.5 - 8.1 g/dL   Albumin 1.9 (L) 3.5 - 5.0 g/dL   AST 19 15 - 41 U/L   ALT 19 0 - 44 U/L  Alkaline Phosphatase 74 38 - 126 U/L   Total Bilirubin 1.4 (H) 0.3 - 1.2 mg/dL   GFR calc non Af Amer >60 >60 mL/min   GFR calc Af Amer >60 >60 mL/min   Anion gap 8 5 - 15  Prealbumin     Status: Abnormal   Collection Time: 02/10/19  3:02 AM  Result Value Ref Range   Prealbumin <5 (L) 18 - 38 mg/dL     PHYSICAL EXAM:   Gen: awake, comfortable appearing  Lungs: breathing unlabored  Ext:       Left Lower Extremity   Knee immobilizer in place  Bulky dressing in place  Ext warm   Moves toes   Sensation grossly intact   Assessment/Plan:     Active Problems:   Atrial fibrillation, chronic   HTN (hypertension)   SIRS (systemic inflammatory response syndrome) (HCC)   Osteomyelitis (HCC)   Acute blood loss anemia   Patellar tendon rupture, left, initial encounter   Anti-infectives (From admission, onward)   Start     Dose/Rate Route Frequency Ordered Stop   02/09/19 1000  ceFEPIme (MAXIPIME) 1 g in sodium chloride 0.9 % 100 mL IVPB     1 g 200 mL/hr over 30 Minutes Intravenous Every 8 hours 02/09/19 0239     02/09/19 0600  vancomycin (VANCOCIN) IVPB 750 mg/150 ml premix     750 mg 150 mL/hr over 60 Minutes Intravenous Every 12 hours 02/09/19 0239     02/09/19 0300  metroNIDAZOLE (FLAGYL) IVPB 500 mg     500 mg 100 mL/hr over 60 Minutes Intravenous Every 8 hours 02/09/19 0230     02/09/19 0245  ceFEPIme (MAXIPIME) 2 g in sodium chloride 0.9 % 100 mL IVPB      2 g 200 mL/hr over 30 Minutes Intravenous  Once 02/09/19 0230 02/09/19 0357   02/09/19 0245  vancomycin (VANCOCIN) IVPB 1000 mg/200 mL premix  Status:  Discontinued     1,000 mg 200 mL/hr over 60 Minutes Intravenous  Once 02/09/19 0230 02/09/19 0230   02/08/19 1615  vancomycin (VANCOCIN) IVPB 1000 mg/200 mL premix     1,000 mg 200 mL/hr over 60 Minutes Intravenous  Once 02/08/19 1612 02/08/19 1914   02/08/19 1615  piperacillin-tazobactam (ZOSYN) IVPB 3.375 g     3.375 g 100 mL/hr over 30 Minutes Intravenous  Once 02/08/19 1612 02/08/19 1856    .  POD/HD#: 92  73 year old white male with impaired cognitive function, acute on chronic left knee pain   -Acute on chronic left knee pain             Clinical and radiological findings suggest multiple components to his left knee pain.                         1.  Left patella avulsion fracture from tibial tuberosity                         2.  Medial and lateral meniscal tears                         3.  Nondisplaced fractures versus bony contusion to the distal lateral femoral condyle and lateral tibial plateau                         4.  Tricompartmental arthritis  5.  Warfarin induced coagulopathy with left knee hemarthrosis                         6.  Gout flare                          OR tomorrow to address left patella tendon rupture/avulsion fracture.  We will also evaluate his menisci intraoperatively as well and address if able to do so.   May place patient in a long-leg cast as he will likely be unable to follow range of motion restrictions.  Would also suspect that he would remove any type of bracing as well    PT and OT to commence postoperatively     Synovial fluid cultures still did not show any growth  - Pain management:             Titrate accordingly   - ABL anemia/Hemodynamics             Work-up per hospitalist service  BPs are good, continue to monitor               CBC in the morning    - Medical issues              Per hospitalist service    - DVT/PE prophylaxis:             Dramatic improvement in INR  Heparin drip started  Hold heparin at 0430 tomorrow morning in preparation for surgery - ID:              No obvious source of infection identified             Patient has been afebrile since admission to the floor             He does have a mild leukocytosis             Synovial fluid culture is pending, synovial fluid protein and glucose values do not suggest inflammatory process either             Blood cultures pending             Urine cultures pending               On empiric antibiotics-cefepime, Flagyl and vancomycin   - Activity:             Up with assistance             WBAT L leg with knee immobilizer on   - FEN/GI prophylaxis/Foley/Lines:             Regular diet, appreciate SLP evaluation                         To follow-up postop     Clear liquid diet after midnight until 0430, then NPO   Please see nursing order for new preoperative diet protocol  - Impediments to fracture healing:             Impaired cognitive function   - Dispo:             [place], PA-C [phone] (C) 02/10/2019, 2:13 PM  [facility] [place] [address] [phone] [name] (F)

## 2019-02-10 NOTE — Progress Notes (Addendum)
Initial Nutrition Assessment RD working remotely.  DOCUMENTATION CODES:   Not applicable  INTERVENTION:    Ensure Enlive po TID, each supplement provides 350 kcal and 20 grams of protein  Multivitamin daily  Magic cup TID with meals, each supplement provides 290 kcal and 9 grams of protein  NUTRITION DIAGNOSIS:   Inadequate oral intake related to lethargy/confusion as evidenced by meal completion < 50%.  GOAL:   Patient will meet greater than or equal to 90% of their needs  MONITOR:   PO intake, Supplement acceptance, Skin  REASON FOR ASSESSMENT:   Consult Assessment of nutrition requirement/status  ASSESSMENT:   73 yo male with PMH of cognitive impairment, Bipolar D/O, A fib, HTN, chronic anticoagulation, HLD who was admitted with left patellar tendon rupture / avulsion fracture, B meniscal tears, SIRS vs sepsis of unclear etiology.  SLP following for dysphagia. He is currently on a regular diet with thin liquids. SLP note from this morning reviewed. Patient declined all solids on his breakfast tray.  Patient to receive clear liquids after midnight until 4am, then NPO for surgery tomorrow.  Patient with increased nutrient needs to support healing after surgery. Patient would benefit from PO supplements to help meet nutrition needs as intake has been poor since admission.   Per review of flow sheets, patient has non-pitting edema of BLE.   Labs reviewed.  Prealbumin <5 (L), albumin 1.9 (L) Prealbumin and albumin are not indicators of nutritional status, but rather indicators of morbidity and mortality, and recovery from acute and chronic illness. Suspect patient's low albumin and prealbumin are reflective of inflammatory process and volume overload / edema.  Medications reviewed.  NUTRITION - FOCUSED PHYSICAL EXAM:  Unable to complete at this time  Diet Order:   Diet Order            Diet NPO time specified Except for: Sips with Meds  Diet effective midnight         Diet clear liquid Room service appropriate? Yes; Fluid consistency: Thin  Diet effective midnight        Diet regular Room service appropriate? Yes with Assist; Fluid consistency: Thin  Diet effective now              EDUCATION NEEDS:   No education needs have been identified at this time  Skin:  Skin Assessment: Reviewed RN Assessment  Last BM:  none documented since admission  Suspected LBM > 7 days ago  Height:   Ht Readings from Last 1 Encounters:  02/09/19 5\' 10"  (1.778 m)    Weight:   Wt Readings from Last 1 Encounters:  02/10/19 93.3 kg    Ideal Body Weight:  75.5 kg  BMI:  Body mass index is 29.51 kg/m.  Estimated Nutritional Needs:   Kcal:  2000-2200  Protein:  105-125 gm  Fluid:  2 L    Molli Barrows, RD, LDN, [place] Pager [phone] After Hours Pager [phone]

## 2019-02-10 NOTE — Progress Notes (Signed)
Pt unaware of last BM, reported to be possibly 7 days.  MD made aware and request for lax made.  Will continue to monitor.

## 2019-02-11 ENCOUNTER — Inpatient Hospital Stay (HOSPITAL_COMMUNITY): Payer: Medicare Other | Admitting: Anesthesiology

## 2019-02-11 ENCOUNTER — Inpatient Hospital Stay (HOSPITAL_COMMUNITY): Payer: Medicare Other

## 2019-02-11 ENCOUNTER — Encounter (HOSPITAL_COMMUNITY): Payer: Self-pay | Admitting: Anesthesiology

## 2019-02-11 ENCOUNTER — Encounter (HOSPITAL_COMMUNITY)
Admission: EM | Disposition: A | Discharge status: Rehabilitation Facility | Payer: Self-pay | Source: Ambulatory Visit | Attending: Internal Medicine

## 2019-02-11 HISTORY — PX: APPLICATION OF WOUND VAC: SHX5189

## 2019-02-11 HISTORY — PX: KNEE ARTHROSCOPY: SHX127

## 2019-02-11 HISTORY — PX: PATELLAR TENDON REPAIR: SHX737

## 2019-02-11 LAB — PROTIME-INR
INR: 1.7 — ABNORMAL HIGH (ref 0.8–1.2)
Prothrombin Time: 19.3 seconds — ABNORMAL HIGH (ref 11.4–15.2)

## 2019-02-11 LAB — CBC
HCT: 26.6 % — ABNORMAL LOW (ref 39.0–52.0)
Hemoglobin: 8.9 g/dL — ABNORMAL LOW (ref 13.0–17.0)
MCH: 28.5 pg (ref 26.0–34.0)
MCHC: 33.5 g/dL (ref 30.0–36.0)
MCV: 85.3 fL (ref 80.0–100.0)
Platelets: 300 10*3/uL (ref 150–400)
RBC: 3.12 MIL/uL — ABNORMAL LOW (ref 4.22–5.81)
RDW: 14.5 % (ref 11.5–15.5)
WBC: 10.5 10*3/uL (ref 4.0–10.5)
nRBC: 0 % (ref 0.0–0.2)

## 2019-02-11 LAB — HEPARIN LEVEL (UNFRACTIONATED)
Heparin Unfractionated: <0.1 IU/mL — ABNORMAL LOW (ref 0.30–0.70)
Heparin Unfractionated: 0.29 IU/mL — ABNORMAL LOW (ref 0.30–0.70)

## 2019-02-11 LAB — PTH, INTACT AND CALCIUM
Calcium, Total (PTH): 8.2 mg/dL — ABNORMAL LOW (ref 8.6–10.2)
PTH: 12 pg/mL — ABNORMAL LOW (ref 15–65)

## 2019-02-11 LAB — VITAMIN D 25 HYDROXY (VIT D DEFICIENCY, FRACTURES): Vit D, 25-Hydroxy: 40.3 ng/mL (ref 30.0–100.0)

## 2019-02-11 LAB — CALCITRIOL (1,25 DI-OH VIT D): Vit D, 1,25-Dihydroxy: 26.5 pg/mL (ref 19.9–79.3)

## 2019-02-11 SURGERY — REPAIR, TENDON, PATELLAR
Anesthesia: General | Site: Knee | Laterality: Left

## 2019-02-11 MED ORDER — PROPOFOL 10 MG/ML IV BOLUS
INTRAVENOUS | Status: DC | PRN
  Administered 2019-02-11: 150 mg via INTRAVENOUS

## 2019-02-11 MED ORDER — FENTANYL CITRATE (PF) 100 MCG/2ML IJ SOLN
50.0000 ug | Freq: Once | INTRAMUSCULAR | Status: AC
  Administered 2019-02-11: 50 ug via INTRAVENOUS

## 2019-02-11 MED ORDER — DEXMEDETOMIDINE HCL IN NACL 200 MCG/50ML IV SOLN
INTRAVENOUS | Status: AC
  Filled 2019-02-11: qty 50

## 2019-02-11 MED ORDER — ROCURONIUM BROMIDE 50 MG/5ML IV SOSY
PREFILLED_SYRINGE | INTRAVENOUS | Status: DC | PRN
  Administered 2019-02-11: 50 mg via INTRAVENOUS

## 2019-02-11 MED ORDER — PROPOFOL 10 MG/ML IV BOLUS
INTRAVENOUS | Status: AC
  Filled 2019-02-11: qty 20

## 2019-02-11 MED ORDER — BUPIVACAINE-EPINEPHRINE (PF) 0.25% -1:200000 IJ SOLN
INTRAMUSCULAR | Status: AC
  Filled 2019-02-11: qty 30

## 2019-02-11 MED ORDER — ONDANSETRON HCL 4 MG/2ML IJ SOLN
INTRAMUSCULAR | Status: DC | PRN
  Administered 2019-02-11: 4 mg via INTRAVENOUS

## 2019-02-11 MED ORDER — SUCCINYLCHOLINE CHLORIDE 200 MG/10ML IV SOSY
PREFILLED_SYRINGE | INTRAVENOUS | Status: AC
  Filled 2019-02-11: qty 10

## 2019-02-11 MED ORDER — SODIUM CHLORIDE 0.9 % IR SOLN
Status: DC | PRN
  Administered 2019-02-11: 3000 mL

## 2019-02-11 MED ORDER — LIDOCAINE 2% (20 MG/ML) 5 ML SYRINGE
INTRAMUSCULAR | Status: DC | PRN
  Administered 2019-02-11: 60 mg via INTRAVENOUS

## 2019-02-11 MED ORDER — ACETAMINOPHEN 500 MG PO TABS
500.0000 mg | ORAL_TABLET | Freq: Three times a day (TID) | ORAL | Status: DC
  Administered 2019-02-11 – 2019-02-15 (×11): 500 mg via ORAL
  Filled 2019-02-11 (×11): qty 1

## 2019-02-11 MED ORDER — FENTANYL CITRATE (PF) 250 MCG/5ML IJ SOLN
INTRAMUSCULAR | Status: DC | PRN
  Administered 2019-02-11: 25 ug via INTRAVENOUS
  Administered 2019-02-11: 50 ug via INTRAVENOUS
  Administered 2019-02-11: 100 ug via INTRAVENOUS

## 2019-02-11 MED ORDER — HEPARIN (PORCINE) 25000 UT/250ML-% IV SOLN
1800.0000 [IU]/h | INTRAVENOUS | Status: DC
  Administered 2019-02-11: 1800 [IU]/h via INTRAVENOUS
  Filled 2019-02-11 (×2): qty 250

## 2019-02-11 MED ORDER — HYDROCODONE-ACETAMINOPHEN 5-325 MG PO TABS
1.0000 | ORAL_TABLET | ORAL | Status: DC | PRN
  Administered 2019-02-12: 2 via ORAL
  Filled 2019-02-11: qty 2

## 2019-02-11 MED ORDER — CEFAZOLIN SODIUM 1 G IJ SOLR
INTRAMUSCULAR | Status: AC
  Filled 2019-02-11: qty 20

## 2019-02-11 MED ORDER — FENTANYL CITRATE (PF) 100 MCG/2ML IJ SOLN: 25.0000 ug | INTRAMUSCULAR | Status: DC | PRN

## 2019-02-11 MED ORDER — WARFARIN - PHARMACIST DOSING INPATIENT
Freq: Every day | Status: DC
  Administered 2019-02-12: 19:00:00

## 2019-02-11 MED ORDER — ROCURONIUM BROMIDE 50 MG/5ML IV SOSY
PREFILLED_SYRINGE | INTRAVENOUS | Status: AC
  Filled 2019-02-11: qty 5

## 2019-02-11 MED ORDER — 0.9 % SODIUM CHLORIDE (POUR BTL) OPTIME
TOPICAL | Status: DC | PRN
  Administered 2019-02-11 (×2): 1000 mL

## 2019-02-11 MED ORDER — SUGAMMADEX SODIUM 200 MG/2ML IV SOLN
INTRAVENOUS | Status: DC | PRN
  Administered 2019-02-11: 180 mg via INTRAVENOUS

## 2019-02-11 MED ORDER — BUPIVACAINE HCL (PF) 0.5 % IJ SOLN
INTRAMUSCULAR | Status: AC
  Filled 2019-02-11: qty 30

## 2019-02-11 MED ORDER — LIDOCAINE 2% (20 MG/ML) 5 ML SYRINGE
INTRAMUSCULAR | Status: AC
  Filled 2019-02-11: qty 5

## 2019-02-11 MED ORDER — FENTANYL CITRATE (PF) 100 MCG/2ML IJ SOLN
INTRAMUSCULAR | Status: AC
  Filled 2019-02-11: qty 2

## 2019-02-11 MED ORDER — PHENYLEPHRINE 40 MCG/ML (10ML) SYRINGE FOR IV PUSH (FOR BLOOD PRESSURE SUPPORT)
PREFILLED_SYRINGE | INTRAVENOUS | Status: DC | PRN
  Administered 2019-02-11 (×2): 80 ug via INTRAVENOUS
  Administered 2019-02-11: 10 ug via INTRAVENOUS

## 2019-02-11 MED ORDER — DEXAMETHASONE SODIUM PHOSPHATE 10 MG/ML IJ SOLN
INTRAMUSCULAR | Status: DC | PRN
  Administered 2019-02-11: 10 mg via INTRAVENOUS

## 2019-02-11 MED ORDER — METOCLOPRAMIDE HCL 5 MG PO TABS: 5.0000 mg | ORAL_TABLET | Freq: Three times a day (TID) | ORAL | Status: DC | PRN

## 2019-02-11 MED ORDER — BUPIVACAINE-EPINEPHRINE (PF) 0.5% -1:200000 IJ SOLN
INTRAMUSCULAR | Status: AC
  Filled 2019-02-11: qty 30

## 2019-02-11 MED ORDER — MIDAZOLAM HCL 2 MG/2ML IJ SOLN
INTRAMUSCULAR | Status: AC
  Filled 2019-02-11: qty 2

## 2019-02-11 MED ORDER — SODIUM CHLORIDE 0.9 % IV SOLN
INTRAVENOUS | Status: DC | PRN
  Administered 2019-02-11: 25 ug/min via INTRAVENOUS

## 2019-02-11 MED ORDER — DEXAMETHASONE SODIUM PHOSPHATE 10 MG/ML IJ SOLN
INTRAMUSCULAR | Status: AC
  Filled 2019-02-11: qty 1

## 2019-02-11 MED ORDER — MORPHINE SULFATE (PF) 2 MG/ML IV SOLN: 0.5000 mg | INTRAVENOUS | Status: DC | PRN

## 2019-02-11 MED ORDER — ACETAMINOPHEN 10 MG/ML IV SOLN
INTRAVENOUS | Status: DC | PRN
  Administered 2019-02-11: 1000 mg via INTRAVENOUS

## 2019-02-11 MED ORDER — GLYCOPYRROLATE PF 0.2 MG/ML IJ SOSY
PREFILLED_SYRINGE | INTRAMUSCULAR | Status: AC
  Filled 2019-02-11: qty 1

## 2019-02-11 MED ORDER — SUCCINYLCHOLINE CHLORIDE 200 MG/10ML IV SOSY
PREFILLED_SYRINGE | INTRAVENOUS | Status: DC | PRN
  Administered 2019-02-11: 100 mg via INTRAVENOUS

## 2019-02-11 MED ORDER — BUPIVACAINE-EPINEPHRINE (PF) 0.5% -1:200000 IJ SOLN
INTRAMUSCULAR | Status: DC | PRN
  Administered 2019-02-11: 30 mL via PERINEURAL

## 2019-02-11 MED ORDER — FENTANYL CITRATE (PF) 250 MCG/5ML IJ SOLN
INTRAMUSCULAR | Status: AC
  Filled 2019-02-11: qty 5

## 2019-02-11 MED ORDER — PHENYLEPHRINE 40 MCG/ML (10ML) SYRINGE FOR IV PUSH (FOR BLOOD PRESSURE SUPPORT)
PREFILLED_SYRINGE | INTRAVENOUS | Status: AC
  Filled 2019-02-11: qty 10

## 2019-02-11 MED ORDER — WARFARIN SODIUM 5 MG PO TABS
5.0000 mg | ORAL_TABLET | Freq: Once | ORAL | Status: AC
  Administered 2019-02-11: 18:00:00 5 mg via ORAL
  Filled 2019-02-11: qty 1

## 2019-02-11 MED ORDER — ALBUMIN HUMAN 5 % IV SOLN
INTRAVENOUS | Status: DC | PRN
  Administered 2019-02-11 (×2): via INTRAVENOUS

## 2019-02-11 MED ORDER — ONDANSETRON HCL 4 MG/2ML IJ SOLN: 4.0000 mg | Freq: Four times a day (QID) | INTRAMUSCULAR | Status: DC | PRN

## 2019-02-11 MED ORDER — DOCUSATE SODIUM 100 MG PO CAPS
100.0000 mg | ORAL_CAPSULE | Freq: Two times a day (BID) | ORAL | Status: DC
  Administered 2019-02-11 – 2019-02-15 (×8): 100 mg via ORAL
  Filled 2019-02-11 (×8): qty 1

## 2019-02-11 MED ORDER — ONDANSETRON HCL 4 MG PO TABS: 4.0000 mg | ORAL_TABLET | Freq: Four times a day (QID) | ORAL | Status: DC | PRN

## 2019-02-11 MED ORDER — ONDANSETRON HCL 4 MG/2ML IJ SOLN
INTRAMUSCULAR | Status: AC
  Filled 2019-02-11: qty 2

## 2019-02-11 MED ORDER — LACTATED RINGERS IV SOLN
INTRAVENOUS | Status: DC | PRN
  Administered 2019-02-11: 08:00:00 via INTRAVENOUS

## 2019-02-11 MED ORDER — METOCLOPRAMIDE HCL 5 MG/ML IJ SOLN: 5.0000 mg | Freq: Three times a day (TID) | INTRAMUSCULAR | Status: DC | PRN

## 2019-02-11 MED ORDER — MORPHINE SULFATE (PF) 4 MG/ML IV SOLN
INTRAVENOUS | Status: AC
  Filled 2019-02-11: qty 1

## 2019-02-11 MED ORDER — ACETAMINOPHEN 10 MG/ML IV SOLN
INTRAVENOUS | Status: AC
  Filled 2019-02-11: qty 100

## 2019-02-11 MED ORDER — LACTATED RINGERS IV SOLN
INTRAVENOUS | Status: DC
  Administered 2019-02-11: 07:00:00 via INTRAVENOUS

## 2019-02-11 SURGICAL SUPPLY — 108 items
BANDAGE ACE 4X5 VEL STRL LF (GAUZE/BANDAGES/DRESSINGS) ×3 IMPLANT
BANDAGE ACE 6X5 VEL STRL LF (GAUZE/BANDAGES/DRESSINGS) ×6 IMPLANT
BANDAGE ELASTIC 4 VELCRO ST LF (GAUZE/BANDAGES/DRESSINGS) ×2 IMPLANT
BANDAGE ELASTIC 6 VELCRO ST LF (GAUZE/BANDAGES/DRESSINGS) ×2 IMPLANT
BANDAGE ESMARK 6X9 LF (GAUZE/BANDAGES/DRESSINGS) ×1 IMPLANT
BIT DRILL 7/64X5 DISP (BIT) ×3 IMPLANT
BLADE CUDA 5.5 (BLADE) IMPLANT
BLADE GREAT WHITE 4.2 (BLADE) ×2 IMPLANT
BLADE GREAT WHITE 4.2MM (BLADE) ×1
BLADE SURG 10 STRL SS (BLADE) ×2 IMPLANT
BLADE SURG 11 STRL SS (BLADE) ×3 IMPLANT
BLADE SURG 15 STRL LF DISP TIS (BLADE) IMPLANT
BLADE SURG 15 STRL SS (BLADE) ×3
BNDG CMPR 9X6 STRL LF SNTH (GAUZE/BANDAGES/DRESSINGS)
BNDG COHESIVE 4X5 TAN STRL (GAUZE/BANDAGES/DRESSINGS) ×3 IMPLANT
BNDG ESMARK 6X9 LF (GAUZE/BANDAGES/DRESSINGS)
BRUSH SCRUB SURG 4.25 DISP (MISCELLANEOUS) ×6 IMPLANT
CLOSURE WOUND 1/2 X4 (GAUZE/BANDAGES/DRESSINGS) ×1
COVER MAYO STAND STRL (DRAPES) ×3 IMPLANT
COVER SURGICAL LIGHT HANDLE (MISCELLANEOUS) ×6 IMPLANT
COVER WAND RF STERILE (DRAPES) ×3 IMPLANT
CUFF TOURNIQUET SINGLE 34IN LL (TOURNIQUET CUFF) IMPLANT
CUFF TOURNIQUET SINGLE 44IN (TOURNIQUET CUFF) IMPLANT
DRAPE ARTHROSCOPY W/POUCH 114 (DRAPES) ×3 IMPLANT
DRAPE C-ARMOR (DRAPES) ×3 IMPLANT
DRAPE HALF SHEET 40X57 (DRAPES) ×2 IMPLANT
DRAPE INCISE IOBAN 66X45 STRL (DRAPES) ×1 IMPLANT
DRAPE ORTHO SPLIT 77X108 STRL (DRAPES) ×9
DRAPE SURG ORHT 6 SPLT 77X108 (DRAPES) ×3 IMPLANT
DRAPE U-SHAPE 47X51 STRL (DRAPES) ×3 IMPLANT
DRESSING PREVENA PLUS CUSTOM (GAUZE/BANDAGES/DRESSINGS) IMPLANT
DRSG EMULSION OIL 3X3 NADH (GAUZE/BANDAGES/DRESSINGS) ×3 IMPLANT
DRSG PAD ABDOMINAL 8X10 ST (GAUZE/BANDAGES/DRESSINGS) ×2 IMPLANT
DRSG PREVENA PLUS CUSTOM (GAUZE/BANDAGES/DRESSINGS) ×3
DURAPREP 26ML APPLICATOR (WOUND CARE) ×1 IMPLANT
ELECT BLADE 4.0 EZ CLEAN MEGAD (MISCELLANEOUS) ×3
ELECT REM PT RETURN 9FT ADLT (ELECTROSURGICAL) ×3
ELECTRODE BLDE 4.0 EZ CLN MEGD (MISCELLANEOUS) IMPLANT
ELECTRODE REM PT RTRN 9FT ADLT (ELECTROSURGICAL) ×1 IMPLANT
GAUZE 4X4 16PLY RFD (DISPOSABLE) ×1 IMPLANT
GAUZE SPONGE 4X4 12PLY STRL (GAUZE/BANDAGES/DRESSINGS) ×3 IMPLANT
GAUZE SPONGE 4X4 12PLY STRL LF (GAUZE/BANDAGES/DRESSINGS) ×2 IMPLANT
GAUZE XEROFORM 1X8 LF (GAUZE/BANDAGES/DRESSINGS) ×3 IMPLANT
GLOVE BIO SURGEON STRL SZ 6.5 (GLOVE) ×1 IMPLANT
GLOVE BIO SURGEON STRL SZ7.5 (GLOVE) ×3 IMPLANT
GLOVE BIO SURGEON STRL SZ8 (GLOVE) ×3 IMPLANT
GLOVE BIO SURGEONS STRL SZ 6.5 (GLOVE) ×1
GLOVE BIOGEL PI IND STRL 7.5 (GLOVE) ×1 IMPLANT
GLOVE BIOGEL PI IND STRL 8 (GLOVE) ×2 IMPLANT
GLOVE BIOGEL PI INDICATOR 7.5 (GLOVE) ×2
GLOVE BIOGEL PI INDICATOR 8 (GLOVE) ×4
GLOVE ECLIPSE 8.0 STRL XLNG CF (GLOVE) ×2 IMPLANT
GLOVE SKINSENSE NS SZ7.5 (GLOVE) ×4
GLOVE SKINSENSE STRL SZ7.5 (GLOVE) IMPLANT
GOWN EXTRA PROTECTION XXL 0583 (GOWNS) ×3 IMPLANT
GOWN STRL REUS W/ TWL LRG LVL3 (GOWN DISPOSABLE) ×2 IMPLANT
GOWN STRL REUS W/ TWL XL LVL3 (GOWN DISPOSABLE) ×1 IMPLANT
GOWN STRL REUS W/TWL LRG LVL3 (GOWN DISPOSABLE) ×6
GOWN STRL REUS W/TWL XL LVL3 (GOWN DISPOSABLE) ×3
IMMOBILIZER KNEE 22 UNIV (SOFTGOODS) IMPLANT
KIT BASIN OR (CUSTOM PROCEDURE TRAY) ×3 IMPLANT
KIT DRSG PREVENA PLUS 7DAY 125 (MISCELLANEOUS) ×2 IMPLANT
KIT TURNOVER KIT B (KITS) ×3 IMPLANT
MANIFOLD NEPTUNE II (INSTRUMENTS) ×3 IMPLANT
NDL 1/2 CIR CATGUT .05X1.09 (NEEDLE) IMPLANT
NEEDLE 1/2 CIR CATGUT .05X1.09 (NEEDLE) ×3 IMPLANT
NEEDLE 22X1 1/2 (OR ONLY) (NEEDLE) IMPLANT
NS IRRIG 1000ML POUR BTL (IV SOLUTION) ×7 IMPLANT
PACK ARTHROSCOPY DSU (CUSTOM PROCEDURE TRAY) ×3 IMPLANT
PACK ORTHO EXTREMITY (CUSTOM PROCEDURE TRAY) ×3 IMPLANT
PAD ARMBOARD 7.5X6 YLW CONV (MISCELLANEOUS) ×6 IMPLANT
PAD CAST 4YDX4 CTTN HI CHSV (CAST SUPPLIES) ×2 IMPLANT
PADDING CAST COTTON 4X4 STRL (CAST SUPPLIES) ×6
PADDING CAST COTTON 6X4 STRL (CAST SUPPLIES) ×6 IMPLANT
PASSER SUT SWANSON 36MM LOOP (INSTRUMENTS) ×1 IMPLANT
PENCIL BUTTON HOLSTER BLD 10FT (ELECTRODE) ×2 IMPLANT
PIN GUIDE FEMORAL STERILE (PIN) ×2 IMPLANT
SET ARTHROSCOPY TUBING (MISCELLANEOUS) ×3
SET ARTHROSCOPY TUBING LN (MISCELLANEOUS) ×1 IMPLANT
SPONGE LAP 18X18 RF (DISPOSABLE) ×3 IMPLANT
SPONGE LAP 18X18 X RAY DECT (DISPOSABLE) ×6 IMPLANT
STAPLER VISISTAT 35W (STAPLE) ×3 IMPLANT
STOCKINETTE IMPERVIOUS 9X36 MD (GAUZE/BANDAGES/DRESSINGS) ×3 IMPLANT
STRIP CLOSURE SKIN 1/2X4 (GAUZE/BANDAGES/DRESSINGS) ×1 IMPLANT
SUCTION FRAZIER HANDLE 10FR (MISCELLANEOUS)
SUCTION TUBE FRAZIER 10FR DISP (MISCELLANEOUS) IMPLANT
SUT ETHIBOND 2 V 37 (SUTURE) IMPLANT
SUT ETHILON 2 0 FS 18 (SUTURE) ×2 IMPLANT
SUT ETHILON 2 0 PSLX (SUTURE) ×6 IMPLANT
SUT ETHILON 4 0 PS 2 18 (SUTURE) ×3 IMPLANT
SUT FIBERWIRE #2 38 REV NDL BL (SUTURE) ×6
SUT PDS AB 4-0 P3 18 (SUTURE) IMPLANT
SUT VIC AB 0 CT1 27 (SUTURE) ×3
SUT VIC AB 0 CT1 27XBRD ANBCTR (SUTURE) IMPLANT
SUT VIC AB 1 CT1 27 (SUTURE) ×3
SUT VIC AB 1 CT1 27XBRD ANBCTR (SUTURE) ×1 IMPLANT
SUT VIC AB 2-0 CTB1 (SUTURE) ×3 IMPLANT
SUTURE FIBERWR#2 38 REV NDL BL (SUTURE) IMPLANT
SYR BULB IRRIGATION 50ML (SYRINGE) ×4 IMPLANT
SYR CONTROL 10ML LL (SYRINGE) IMPLANT
TOWEL OR 17X24 6PK STRL BLUE (TOWEL DISPOSABLE) ×6 IMPLANT
TOWEL OR 17X26 10 PK STRL BLUE (TOWEL DISPOSABLE) ×3 IMPLANT
TUBE CONNECTING 12'X1/4 (SUCTIONS) ×1
TUBE CONNECTING 12X1/4 (SUCTIONS) ×2 IMPLANT
UNDERPAD 30X30 (UNDERPADS AND DIAPERS) ×3 IMPLANT
WAND HAND CNTRL MULTIVAC 90 (MISCELLANEOUS) IMPLANT
WATER STERILE IRR 1000ML POUR (IV SOLUTION) ×3 IMPLANT
YANKAUER SUCT BULB TIP NO VENT (SUCTIONS) ×5 IMPLANT

## 2019-02-11 NOTE — Anesthesia Preprocedure Evaluation (Addendum)
Anesthesia Evaluation  Patient identified by MRN, date of birth, ID band Patient awake    Reviewed: Allergy & Precautions, NPO status , Patient's Chart, lab work & pertinent test results  Airway Mallampati: II  TM Distance: >3 FB Neck ROM: Full    Dental  (+) Teeth Intact, Dental Advisory Given   Pulmonary neg pulmonary ROS,    Pulmonary exam normal        Cardiovascular hypertension,  Rhythm:Irregular Rate:Normal     Neuro/Psych negative neurological ROS     GI/Hepatic Neg liver ROS, GERD  ,  Endo/Other  negative endocrine ROS  Renal/GU negative Renal ROS  negative genitourinary   Musculoskeletal negative musculoskeletal ROS (+)   Abdominal Normal abdominal exam  (+)   Peds  Hematology   Anesthesia Other Findings   Reproductive/Obstetrics                            Lab Results  Component Value Date   WBC 10.5 02/11/2019   HGB 8.9 (L) 02/11/2019   HCT 26.6 (L) 02/11/2019   MCV 85.3 02/11/2019   PLT 300 02/11/2019   Lab Results  Component Value Date   CREATININE 0.96 02/10/2019   BUN 19 02/10/2019   NA 137 02/10/2019   K 3.6 02/10/2019   CL 106 02/10/2019   CO2 23 02/10/2019   Lab Results  Component Value Date   INR 1.7 (H) 02/11/2019   INR 1.6 (H) 02/10/2019   INR 6.2 (HH) 02/09/2019   Echo: - Left ventricle: The cavity size was normal. Wall thickness was   normal. Systolic function was normal. The estimated ejection   fraction was in the range of 60% to 65%. Wall motion was normal;   there were no regional wall motion abnormalities. - Aortic valve: There was mild regurgitation. - Left atrium: The atrium was moderately dilated. - Right atrium: The atrium was moderately dilated.   Anesthesia Physical Anesthesia Plan  ASA: III  Anesthesia Plan: General   Post-op Pain Management:    Induction: Intravenous  PONV Risk Score and Plan: 3 and Ondansetron, Treatment  may vary due to age or medical condition and Dexamethasone  Airway Management Planned: Oral ETT and LMA  Additional Equipment: None  Intra-op Plan:   Post-operative Plan: Extubation in OR  Informed Consent: I have reviewed the patients History and Physical, chart, labs and discussed the procedure including the risks, benefits and alternatives for the proposed anesthesia with the patient or authorized representative who has indicated his/her understanding and acceptance.     Dental advisory given  Plan Discussed with: CRNA  Anesthesia Plan Comments:        Anesthesia Quick Evaluation

## 2019-02-11 NOTE — Progress Notes (Signed)
ANTICOAGULATION CONSULT NOTE - Initial Consult  Pharmacy Consult for heparin Indication: atrial fibrillation  Labs: Recent Labs    02/09/19 0524 02/09/19 0953 02/10/19 0302 02/10/19 1807 02/11/19 0304 02/11/19 2256  HGB 9.9* 9.5* 8.8*  --  8.9*  --   HCT 31.8* 29.1* 26.9*  --  26.6*  --   PLT 268 275 276  --  300  --   LABPROT  --  53.8* 18.8*  --  19.3*  --   INR  --  6.2* 1.6*  --  1.7*  --   HEPARINUNFRC  --   --   --  <0.10* <0.10* 0.29*  CREATININE 0.87  --  0.96  --   --   --     Assessment/Plan:  73yo male slightly subtherapeutic on heparin after resumed post-op though likely to continue to accumulate. Will continue gtt at current rate and confirm with am labs.   Wynona Neat, PharmD, BCPS  02/12/2019,12:00 AM

## 2019-02-11 NOTE — Brief Op Note (Signed)
02/11/2019  11:35 AM  PATIENT:  Russell Dillon  73 y.o. male  PRE-OPERATIVE DIAGNOSIS:   1. LEFT KNEE PATELLAR TENDON DISRUPTION 2. MEDIAL AND LATERAL MENISCUS TEARS  POST-OPERATIVE DIAGNOSIS:   1. LEFT KNEE PATELLAR TENDON DISRUPTION 2. MEDIAL AND LATERAL MENISCUS TEARS  PROCEDURE:  Procedure(s): 1. LEFT PATELLA TENDON PRIMARY REPAIR (Left) 2. ARTHROSCOPY LEFT KNEE WITH PARTIAL MEDIAL AND LATERAL MENISC (Left) 3. Application Of Wound Vac (Left)  SURGEON:  Surgeon(s) and Role:    Altamese Greenwood, MD - Primary  PHYSICIAN ASSISTANT: Ainsley Spinner, PA-C  ANESTHESIA:   general  EBL:  150 mL   BLOOD ADMINISTERED:none  DRAINS: none   LOCAL MEDICATIONS USED:  NONE  SPECIMEN:  No Specimen  DISPOSITION OF SPECIMEN:  N/A  COUNTS:  YES  TOURNIQUET:  * Missing tourniquet times found for documented tourniquets in log: [id number] *  DICTATION: [id number]  PLAN OF CARE: Admit to inpatient   PATIENT DISPOSITION:  PACU - hemodynamically stable.   Delay start of Pharmacological VTE agent (>24hrs) due to surgical blood loss or risk of bleeding: no

## 2019-02-11 NOTE — Anesthesia Procedure Notes (Addendum)
Anesthesia Regional Block: Femoral nerve block   Pre-Anesthetic Checklist: ,, timeout performed, Correct Patient, Correct Site, Correct Laterality, Correct Procedure, Correct Position, site marked, Risks and benefits discussed,  Surgical consent,  Pre-op evaluation,  At surgeon's request and post-op pain management  Laterality: Left  Prep: chloraprep       Needles:  Injection technique: Single-shot  Needle Type: Echogenic Stimulator Needle     Needle Length: 9cm  Needle Gauge: 21     Additional Needles:   Procedures:,,,, ultrasound used (permanent image in chart),,,,  Narrative:  Start time: 02/11/2019 7:25 AM End time: 02/11/2019 7:35 AM Injection made incrementally with aspirations every 5 mL.  Performed by: Personally  Anesthesiologist: Effie Berkshire, MD  Additional Notes: Patient tolerated the procedure well. Local anesthetic introduced in an incremental fashion under minimal resistance after negative aspirations. No paresthesias were elicited. After completion of the procedure, no acute issues were identified and patient continued to be monitored by RN.

## 2019-02-11 NOTE — Anesthesia Procedure Notes (Signed)
Procedure Name: Intubation Date/Time: 02/11/2019 8:19 AM Performed by: Renato Shin, CRNA Pre-anesthesia Checklist: Patient identified, Emergency Drugs available, Suction available and Patient being monitored Patient Re-evaluated:Patient Re-evaluated prior to induction Oxygen Delivery Method: Circle system utilized Preoxygenation: Pre-oxygenation with 100% oxygen Induction Type: IV induction and Rapid sequence Laryngoscope Size: Miller and 2 Grade View: Grade I Tube type: Oral Tube size: 7.5 mm Number of attempts: 1 Airway Equipment and Method: Stylet Placement Confirmation: ETT inserted through vocal cords under direct vision,  positive ETCO2 and breath sounds checked- equal and bilateral Secured at: 21 cm Tube secured with: Tape Dental Injury: Teeth and Oropharynx as per pre-operative assessment

## 2019-02-11 NOTE — Progress Notes (Signed)
ANTICOAGULATION CONSULT NOTE - Follow Up Consult  Pharmacy Consult for Heparin/Coumadin Indication: atrial fibrillation  No Known Allergies  Patient Measurements: Height: 5\' 10"  (177.8 cm) Weight: 197 lb 12 oz (89.7 kg) IBW/kg (Calculated) : 73 Heparin Dosing Weight: 89.7 kg  Vital Signs: Temp: 97.6 F (36.4 C) (04/02 1200) Temp Source: Oral (04/02 1200) BP: 103/58 (04/02 1200) Pulse Rate: 81 (04/02 1200)  Labs: Recent Labs    02/08/19 1401  02/09/19 0524 02/09/19 0953 02/10/19 0302 02/10/19 1807 02/11/19 0304  HGB 10.8*   < > 9.9* 9.5* 8.8*  --  8.9*  HCT 33.7*   < > 31.8* 29.1* 26.9*  --  26.6*  PLT 299   < > 268 275 276  --  300  LABPROT 40.7*  --   --  53.8* 18.8*  --  19.3*  INR 4.3*  --   --  6.2* 1.6*  --  1.7*  HEPARINUNFRC  --   --   --   --   --  <0.10* <0.10*  CREATININE 1.17  --  0.87  --  0.96  --   --    < > = values in this interval not displayed.    Estimated Creatinine Clearance: 76.1 mL/min (by C-G formula based on SCr of 0.96 mg/dL).  Assessment:  Anticoag: Coumadin PTA- on hold. INR 6.2 > vit K 5mg  IV > 1.6. INR 1.7 today. IV heparin can resume at 4PM post-op. Resume Coumadin. Severe drug interaction between Coumadin and Flagyl (messaged ortho PA, ortho MD, and Dr. Sherral Hammers). Can Flagyl be discontinued? - PTA warfarin dose 5 mg daily except 2.5 mg on Sundays (admit INR 6.2)  **Goal of Therapy:  Heparin level 0.3-0.7 units/ml  INR 2-3 Monitor platelets by anticoagulation protocol: Yes   Plan:  IV heparin at 4PM at 1800 units/hr. Daily HL and CBC Coumadin 5mg  po x 1 tonight. Daily INR Please consider d/c IV Flagyl dur to drug interaction with warfarin.   Russell Dillon, PharmD, [place] Clinical Staff Pharmacist Eilene Ghazi Stillinger 02/11/2019,12:41 PM

## 2019-02-11 NOTE — Anesthesia Postprocedure Evaluation (Signed)
Anesthesia Post Note  Patient: Russell Dillon  Procedure(s) Performed: LEFT PATELLA TENDON REPAIR (Left Knee) ARTHROSCOPY LEFT KNEE (Left Knee) Application Of Wound Vac (Left Knee)     Patient location during evaluation: PACU Anesthesia Type: General Level of consciousness: awake and alert Pain management: pain level controlled Vital Signs Assessment: post-procedure vital signs reviewed and stable Respiratory status: spontaneous breathing, nonlabored ventilation, respiratory function stable and patient connected to nasal cannula oxygen Cardiovascular status: blood pressure returned to baseline and stable Postop Assessment: no apparent nausea or vomiting Anesthetic complications: no    Last Vitals:  Vitals:   02/11/19 1141 02/11/19 1200  BP: (!) 103/52 (!) 103/58  Pulse: 70 81  Resp: 12   Temp:  36.4 C  SpO2: 96% 100%    Last Pain:  Vitals:   02/11/19 1200  TempSrc: Oral  PainSc: 0-No pain                 Effie Berkshire

## 2019-02-11 NOTE — TOC Progression Note (Signed)
Transition of Care [facility]) - Progression Note    Patient Details  Name: Russell Dillon MRN: [id number] Date of Birth: Jun 20, 1946  Transition of Care [facility]) CM/SW [place], [place] Phone Number: 02/11/2019, 12:55 PM  Clinical Narrative:     CSW acknowledges consult for SNF placement . Will follow for therapy recommendations, although patient's wife has expressed the patient will discharge home.  Thurmond Butts, LCSWA Clinical Social Worker [phone]   Expected Discharge Plan: [place] Barriers to Discharge: Continued Medical Work up  Expected Discharge Plan and Services Expected Discharge Plan: [place] arrangements for the past 2 months: Single Family Home                           Social Determinants of Health (SDOH) Interventions    Readmission Risk Interventions No flowsheet data found.

## 2019-02-11 NOTE — Progress Notes (Signed)
Orthopedic Tech Progress Note Patient Details:  Russell Dillon 29-Aug-1946 [id number] Called in Order to [place]. Ortho Devices Type of Ortho Device: Ace wrap, Doran Durand splint, Knee Immobilizer Ortho Device/Splint Interventions: Ordered, Application, Adjustment   Post Interventions Patient Tolerated: Well Instructions Provided: Adjustment of device, Care of device   Russell Dillon J Friend Dorfman 02/11/2019, 12:51 PM

## 2019-02-11 NOTE — Transfer of Care (Signed)
Immediate Anesthesia Transfer of Care Note  Patient: Russell Dillon  Procedure(s) Performed: LEFT PATELLA TENDON REPAIR (Left Knee) ARTHROSCOPY LEFT KNEE (Left Knee) Application Of Wound Vac (Left Knee)  Patient Location: PACU  Anesthesia Type:GA combined with regional for post-op pain  Level of Consciousness: awake  Airway & Oxygen Therapy: Patient Spontanous Breathing and Patient connected to face mask oxygen  Post-op Assessment: Report given to RN and Post -op Vital signs reviewed and stable  Post vital signs: Reviewed and stable  Last Vitals:  Vitals Value Taken Time  BP 98/59 02/11/2019 11:11 AM  Temp    Pulse 73 02/11/2019 11:12 AM  Resp 11 02/11/2019 11:12 AM  SpO2 100 % 02/11/2019 11:12 AM  Vitals shown include unvalidated device data.  Last Pain:  Vitals:   02/11/19 0725  TempSrc:   PainSc: 0-No pain         Complications: No apparent anesthesia complications

## 2019-02-12 ENCOUNTER — Encounter (HOSPITAL_COMMUNITY): Payer: Self-pay | Admitting: Orthopedic Surgery

## 2019-02-12 DIAGNOSIS — M00862 Arthritis due to other bacteria, left knee: Secondary | ICD-10-CM

## 2019-02-12 LAB — PROTIME-INR
INR: 2.3 — ABNORMAL HIGH (ref 0.8–1.2)
Prothrombin Time: 24.5 seconds — ABNORMAL HIGH (ref 11.4–15.2)

## 2019-02-12 LAB — BODY FLUID CULTURE: Culture: NO GROWTH

## 2019-02-12 LAB — CBC
HCT: 29.2 % — ABNORMAL LOW (ref 39.0–52.0)
Hemoglobin: 9.6 g/dL — ABNORMAL LOW (ref 13.0–17.0)
MCH: 28 pg (ref 26.0–34.0)
MCHC: 32.9 g/dL (ref 30.0–36.0)
MCV: 85.1 fL (ref 80.0–100.0)
Platelets: 362 10*3/uL (ref 150–400)
RBC: 3.43 MIL/uL — ABNORMAL LOW (ref 4.22–5.81)
RDW: 14.5 % (ref 11.5–15.5)
WBC: 14.1 10*3/uL — ABNORMAL HIGH (ref 4.0–10.5)
nRBC: 0 % (ref 0.0–0.2)

## 2019-02-12 LAB — BASIC METABOLIC PANEL
Anion gap: 7 (ref 5–15)
BUN: 18 mg/dL (ref 8–23)
CO2: 25 mmol/L (ref 22–32)
Calcium: 8.4 mg/dL — ABNORMAL LOW (ref 8.9–10.3)
Chloride: 106 mmol/L (ref 98–111)
Creatinine, Ser: 0.85 mg/dL (ref 0.61–1.24)
GFR calc Af Amer: >60 mL/min (ref >60)
GFR calc non Af Amer: >60 mL/min (ref >60)
Glucose, Bld: 168 mg/dL — ABNORMAL HIGH (ref 70–99)
Potassium: 3.9 mmol/L (ref 3.5–5.1)
Sodium: 138 mmol/L (ref 135–145)

## 2019-02-12 LAB — HEPARIN LEVEL (UNFRACTIONATED): Heparin Unfractionated: 0.33 IU/mL (ref 0.30–0.70)

## 2019-02-12 MED ORDER — WARFARIN SODIUM 1 MG PO TABS
1.0000 mg | ORAL_TABLET | Freq: Once | ORAL | Status: AC
  Administered 2019-02-12: 1 mg via ORAL
  Filled 2019-02-12: qty 1

## 2019-02-12 MED ORDER — WARFARIN SODIUM 2.5 MG PO TABS: 2.5000 mg | ORAL_TABLET | Freq: Once | ORAL | Status: DC

## 2019-02-12 NOTE — Evaluation (Signed)
Occupational Therapy Evaluation Patient Details Name: Russell Dillon MRN: [id number] DOB: 16-Apr-1946 Today's Date: 02/12/2019    History of Present Illness Pt is a 73 y.o. M with significant PMH of dementia, atrial fibrillation, bipolar disorder who was admitted with increasing left knee pain. Now s/p repair of left patellar tendon rupture 4/2   Clinical Impression   Pt PTA: living with spouse who has a rotator cuff tear and has been assisting with transfers for pt. Pt currently performing bed mobility with maxA+2; sit to stand with mAxA+2 and pt leaning to R side and unable to bear wt on LLE. Pt performing  Minimal movement of LLE in knee immobilizer and pt unable to perform single leg raise. Often, pt unable to follow simple commands for tasks so pt required assist for sequencing. No pain reported when asked. Pt requiring min to modA for UB ADL and MaxA to [place] for LB ADL. Pt unable to properly care for self and spouse is unable to provide strenuous care at this time. Pt would greatly benefit from continued OT skilled services for ADL, mobility and safety in CIR setting. OT to follow acutely.  HR tachy with RN in room after minimal exertion with up to 170s. Pain meds given as pt returned to supine.    Follow Up Recommendations  CIR;Supervision/Assistance - 24 hour    Equipment Recommendations  3 in 1 bedside commode    Recommendations for Other Services Rehab consult     Precautions / Restrictions Precautions Precautions: Fall Required Braces or Orthoses: Other Brace Other Brace: Left knee hinged brace locked in extension at all times. NO KNEE ROM Restrictions Weight Bearing Restrictions: Yes LLE Weight Bearing: Weight bearing as tolerated Other Position/Activity Restrictions: Immobilizer in place      Mobility Bed Mobility Overal bed mobility: Needs Assistance Bed Mobility: Supine to Sit;Sit to Supine     Supine to sit: Max assist;+2 for physical assistance Sit to  supine: Max assist;+2 for physical assistance   General bed mobility comments: MaxA + 2 for all aspects of bed mobility. Guarded with movement  Transfers Overall transfer level: Needs assistance Equipment used: Rolling walker (2 wheeled) Transfers: Sit to/from Stand Sit to Stand: Max assist;+2 physical assistance         General transfer comment: MaxA + 2 for partial stand with significantly elevated bed. Max cues for hand/foot placement    Balance Overall balance assessment: Needs assistance Sitting-balance support: Feet supported Sitting balance-Leahy Scale: Fair     Standing balance support: Bilateral upper extremity supported Standing balance-Leahy Scale: Zero                             ADL either performed or assessed with clinical judgement   ADL Overall ADL's : Needs assistance/impaired Eating/Feeding: Set up;Sitting   Grooming: Set up;Sitting   Upper Body Bathing: Moderate assistance;Sitting   Lower Body Bathing: Maximal assistance;Total assistance;Sitting/lateral leans;Sit to/from stand   Upper Body Dressing : Moderate assistance;Sitting   Lower Body Dressing: Maximal assistance;Total assistance;Sitting/lateral leans   Toilet Transfer: Total assistance;+2 for physical assistance;+2 for safety/equipment   Toileting- Clothing Manipulation and Hygiene: Maximal assistance;Sitting/lateral lean;Sit to/from stand       Functional mobility during ADLs: Maximal assistance;+2 for physical assistance;+2 for safety/equipment;Rolling walker(unable to take steps) General ADL Comments: Pt requires 1 step commands, unable to properly care for self as pt is very confused easily. Pt required assist from spouse for transfers prior. Pt  unable to ofllow msot ocmmands due to confusion.     Vision Baseline Vision/History: No visual deficits Vision Assessment?: No apparent visual deficits     Perception     Praxis      Pertinent Vitals/Pain Pain Assessment:  Faces Faces Pain Scale: Hurts little more Pain Location: LLE Pain Intervention(s): Monitored during session     Hand Dominance Right   Extremity/Trunk Assessment Upper Extremity Assessment Upper Extremity Assessment: Defer to OT evaluation   Lower Extremity Assessment Lower Extremity Assessment: LLE deficits/detail LLE Deficits / Details: s/p patellar tendon repair. unable to move LLE   Cervical / Trunk Assessment Cervical / Trunk Assessment: Normal   Communication Communication Communication: No difficulties   Cognition Arousal/Alertness: Awake/alert Behavior During Therapy: WFL for tasks assessed/performed Overall Cognitive Status: History of cognitive impairments - at baseline                                 General Comments: history of dementia   General Comments  PT spoke with pt's spouse on phone for lengthy convo revealing CIR is best possible scenario for pt. Pt performing minimal mobility without maxA+2. due to tachy HR 114-170 BPM with minimal exertion today. RN aware and in room.    Exercises     Shoulder Instructions      Home Living Family/patient expects to be discharged to:: Private residence Living Arrangements: Spouse/significant other Available Help at Discharge: Family;Available PRN/intermittently Type of Home: House Home Access: Stairs to enter     Home Layout: Two level     Bathroom Shower/Tub: Walk-in shower         Home Equipment: Bedside commode;Hospital bed;Wheelchair - manual   Additional Comments: Pt wife has current rotator cuff tear      Prior Functioning/Environment Level of Independence: Needs assistance  Gait / Transfers Assistance Needed: wife has been assisting with transfers since onset of left knee pain ADL's / Homemaking Assistance Needed: wife assists with ADL's            OT Problem List: Decreased strength;Decreased activity tolerance;Impaired balance (sitting and/or standing);Decreased  cognition;Decreased coordination;Decreased safety awareness;Pain      OT Treatment/Interventions: Self-care/ADL training;Neuromuscular education;Therapeutic exercise;Energy conservation;Therapeutic activities;Patient/family education;Balance training    OT Goals(Current goals can be found in the care plan section) Acute Rehab OT Goals Patient Stated Goal: Pt wife would like pt to go to CIR OT Goal Formulation: With patient Time For Goal Achievement: 02/26/19 Potential to Achieve Goals: Good ADL Goals Pt Will Perform Grooming: with set-up;sitting Pt Will Perform Upper Body Dressing: with set-up;sitting Pt Will Perform Lower Body Dressing: with mod assist;sitting/lateral leans;sit to/from stand;with adaptive equipment Pt Will Transfer to Toilet: with min assist;stand pivot transfer;bedside commode Pt/caregiver will Perform Home Exercise Program: Both right and left upper extremity;With Supervision;With written HEP provided Additional ADL Goal #1: pt will perform ADL functional task with set-upA seated at EOB or edge of recliner in unsupported sitting for 15 mins, fair balance throughout.  OT Frequency: Min 3X/week   Barriers to D/C: Decreased caregiver support  pt spouse with back deficits       Co-evaluation              AM-PAC OT "6 Clicks" Daily Activity     Outcome Measure Help from another person eating meals?: None Help from another person taking care of personal grooming?: A Little Help from another person toileting, which includes using toliet, bedpan, or  urinal?: Total Help from another person bathing (including washing, rinsing, drying)?: A Lot Help from another person to put on and taking off regular upper body clothing?: A Little Help from another person to put on and taking off regular lower body clothing?: Total 6 Click Score: 14   End of Session Equipment Utilized During Treatment: Gait belt;Rolling walker Nurse Communication: Mobility status  Activity  Tolerance: Patient tolerated treatment well Patient left: in bed;with call bell/phone within reach;with bed alarm set  OT Visit Diagnosis: Unsteadiness on feet (R26.81);Muscle weakness (generalized) (M62.81)                Time: 1045-1130 OT Time Calculation (min): 45 min Charges:  OT General Charges $OT Visit: 1 Visit OT Evaluation $OT Eval Moderate Complexity: 1 Mod  Darryl Nestle) Marsa Aris OTR/L Acute Rehabilitation Services Pager: [phone] Office: [phone]   Fredda Hammed 02/12/2019, 3:33 PM

## 2019-02-12 NOTE — Evaluation (Addendum)
Physical Therapy Evaluation Patient Details Name: Russell Dillon MRN: [id number] DOB: Nov 29, 1945 Today's Date: 02/12/2019   History of Present Illness  Pt is a 73 y.o. M with significant PMH of dementia, atrial fibrillation, bipolar disorder who was admitted with increasing left knee pain. Now s/p repair of left patellar tendon rupture 4/2  Clinical Impression  Pt admitted with above diagnosis. Pt currently with functional limitations due to the deficits listed below (see PT Problem List). Prior to onset of left patellar tendon rupture, pt independent with mobility and requiring assist with some ADL's/IADL's due to baseline dementia. On PT evaluation, patient presenting with decreased functional mobility secondary to baseline cognitive deficits, left lower extremity pain, and weakness. Pt requiring two person maximal assistance for bed mobility and transfers to partial standing. Further assessment of transfer to chair or ambulation deferred due to maintained tachycardia to 170 bpm with limited mobility. HR decrease to 90's with return to supine. Pt needs CIR for transfer training, family training/education, and strengthening to maximize functional independence and decrease caregiver burden.   Extensive phone conversation with pt wife, [name]. [name] became tearful during conversation and states with the current COVID-19 crisis she will not consider SNF but is very excited/hopeful for screening for CIR admission. She states, "I am 95 pounds and have a torn rotator cuff so I'm concerned about my ability to lift him." However, she does have other family members who are willing to provide increased assistance as well.      Follow Up Recommendations CIR;Supervision/Assistance - 24 hour    Equipment Recommendations  Hospital bed;Wheelchair (measurements PT);3in1 (PT)    Recommendations for Other Services Rehab consult     Precautions / Restrictions Precautions Precautions: Fall Required Braces or  Orthoses: Other Brace Other Brace: Left knee hinged brace locked in extension at all times. NO KNEE ROM Restrictions Weight Bearing Restrictions: Yes LLE Weight Bearing: Weight bearing as tolerated Other Position/Activity Restrictions: Immobilizer in place      Mobility  Bed Mobility Overal bed mobility: Needs Assistance Bed Mobility: Supine to Sit;Sit to Supine     Supine to sit: Max assist;+2 for physical assistance Sit to supine: Max assist;+2 for physical assistance   General bed mobility comments: MaxA + 2 for all aspects of bed mobility. Guarded with movement  Transfers Overall transfer level: Needs assistance Equipment used: Rolling walker (2 wheeled) Transfers: Sit to/from Stand Sit to Stand: Max assist;+2 physical assistance         General transfer comment: MaxA + 2 for partial stand with significantly elevated bed. Max cues for hand/foot placement  Ambulation/Gait                Stairs            Wheelchair Mobility    Modified Rankin (Stroke Patients Only)       Balance Overall balance assessment: Needs assistance Sitting-balance support: Feet supported Sitting balance-Leahy Scale: Fair     Standing balance support: Bilateral upper extremity supported Standing balance-Leahy Scale: Zero                               Pertinent Vitals/Pain Pain Assessment: Faces Faces Pain Scale: Hurts little more Pain Location: LLE Pain Intervention(s): Monitored during session    Home Living Family/patient expects to be discharged to:: Private residence Living Arrangements: Spouse/significant other Available Help at Discharge: Family;Available PRN/intermittently Type of Home: House Home Access: Stairs to enter  Home Layout: Two level Home Equipment: Bedside commode;Hospital bed;Wheelchair - manual Additional Comments: Pt wife has current rotator cuff tear    Prior Function Level of Independence: Needs assistance   Gait /  Transfers Assistance Needed: wife has been assisting with transfers since onset of left knee pain  ADL's / Homemaking Assistance Needed: wife assists with ADL's        Hand Dominance        Extremity/Trunk Assessment   Upper Extremity Assessment Upper Extremity Assessment: Defer to OT evaluation    Lower Extremity Assessment Lower Extremity Assessment: LLE deficits/detail LLE Deficits / Details: s/p patellar tendon repair. unable to perform SLR       Communication   Communication: No difficulties  Cognition Arousal/Alertness: Awake/alert Behavior During Therapy: WFL for tasks assessed/performed Overall Cognitive Status: History of cognitive impairments - at baseline                                 General Comments: history of dementia      General Comments      Exercises     Assessment/Plan    PT Assessment Patient needs continued PT services  PT Problem List Decreased strength;Decreased range of motion;Decreased activity tolerance;Decreased balance;Decreased mobility;Decreased cognition;Decreased safety awareness;Decreased knowledge of use of DME;Decreased knowledge of precautions;Cardiopulmonary status limiting activity       PT Treatment Interventions DME instruction;Gait training;Functional mobility training;Therapeutic activities;Therapeutic exercise;Balance training;Patient/family education;Wheelchair mobility training    PT Goals (Current goals can be found in the Care Plan section)  Acute Rehab PT Goals Patient Stated Goal: Pt wife would like pt to go to CIR PT Goal Formulation: With patient/family Time For Goal Achievement: 02/26/19 Potential to Achieve Goals: Good    Frequency Min 5X/week   Barriers to discharge        Co-evaluation               AM-PAC PT "6 Clicks" Mobility  Outcome Measure Help needed turning from your back to your side while in a flat bed without using bedrails?: Total Help needed moving from lying on  your back to sitting on the side of a flat bed without using bedrails?: Total Help needed moving to and from a bed to a chair (including a wheelchair)?: Total Help needed standing up from a chair using your arms (e.g., wheelchair or bedside chair)?: Total Help needed to walk in hospital room?: Total Help needed climbing 3-5 steps with a railing? : Total 6 Click Score: 6    End of Session Equipment Utilized During Treatment: Gait belt Activity Tolerance: Treatment limited secondary to medical complications (Comment)(tachycardia) Patient left: in bed;with call bell/phone within reach;with bed alarm set Nurse Communication: Mobility status PT Visit Diagnosis: Other abnormalities of gait and mobility (R26.89);Pain Pain - Right/Left: Left Pain - part of body: Leg    Time: 1050-1136 PT Time Calculation (min) (ACUTE ONLY): 46 min   Charges:   PT Evaluation $PT Eval Moderate Complexity: 1 Mod PT Treatments $Therapeutic Activity: 8-22 mins       Ellamae Sia, PT, DPT Acute Rehabilitation Services Pager [phone] Office [phone]   Willy Eddy 02/12/2019, 1:57 PM

## 2019-02-12 NOTE — Care Management Important Message (Signed)
Important Message  Patient Details  Name: Russell Dillon MRN: [id number] Date of Birth: May 31, 1946   Medicare Important Message Given:  Yes    Joyanne Eddinger Montine Circle 02/12/2019, 3:41 PM

## 2019-02-12 NOTE — Progress Notes (Signed)
Rehab Admissions Coordinator Note:  Patient was screened by Cleatrice Burke for appropriateness for an Inpatient Acute Rehab Consult.  At this time, we are recommending Inpatient Rehab consult. Please place order for consult.  Cleatrice Burke RN MSN 02/12/2019, 2:47 PM  I can be reached at [phone].

## 2019-02-12 NOTE — Progress Notes (Addendum)
Orthopedic Trauma Service Progress Note  Patient ID: Russell Dillon MRN: [id number] DOB/AGE: 05/22/1946 73 y.o.  Subjective:  Resting comfortably  Sleeping but easily arousable Appears to be at baseline    Review of Systems  Unable to perform ROS: Dementia    Objective:   VITALS:   Vitals:   02/12/19 0048 02/12/19 0445 02/12/19 0743 02/12/19 0932  BP: 101/61 117/75 106/67 106/67  Pulse: 86 98 98   Resp: 18 18 14    Temp: 97.8 F (36.6 C) 97.6 F (36.4 C) 98 F (36.7 C)   TempSrc: Oral Oral Oral   SpO2: 97% 99% 99%   Weight:  93.9 kg    Height:        Estimated body mass index is 29.7 kg/m as calculated from the following:   Height as of this encounter: 5\' 10"  (1.778 m).   Weight as of this encounter: 93.9 kg.   Intake/Output      04/02 0701 - 04/03 0700 04/03 0701 - 04/04 0700   P.O. 60    I.V. (mL/kg) 1400.1 (14.9)    IV Piggyback 665.1    Total Intake(mL/kg) 2125.2 (22.6)    Urine (mL/kg/hr) 2175 (1)    Drains 0    Blood 150    Total Output 2325    Net -199.8           LABS  Results for orders placed or performed during the hospital encounter of 02/08/19 (from the past 24 hour(s))  Heparin level (unfractionated)     Status: Abnormal   Collection Time: 02/11/19 10:56 PM  Result Value Ref Range   Heparin Unfractionated 0.29 (L) 0.30 - 0.70 IU/mL  CBC     Status: Abnormal   Collection Time: 02/12/19  2:10 AM  Result Value Ref Range   WBC 14.1 (H) 4.0 - 10.5 K/uL   RBC 3.43 (L) 4.22 - 5.81 MIL/uL   Hemoglobin 9.6 (L) 13.0 - 17.0 g/dL   HCT 29.2 (L) 39.0 - 52.0 %   MCV 85.1 80.0 - 100.0 fL   MCH 28.0 26.0 - 34.0 pg   MCHC 32.9 30.0 - 36.0 g/dL   RDW 14.5 11.5 - 15.5 %   Platelets 362 150 - 400 K/uL   nRBC 0.0 0.0 - 0.2 %  Basic metabolic panel     Status: Abnormal   Collection Time: 02/12/19  2:10 AM  Result Value Ref Range   Sodium 138 135 - 145 mmol/L   Potassium  3.9 3.5 - 5.1 mmol/L   Chloride 106 98 - 111 mmol/L   CO2 25 22 - 32 mmol/L   Glucose, Bld 168 (H) 70 - 99 mg/dL   BUN 18 8 - 23 mg/dL   Creatinine, Ser 0.85 0.61 - 1.24 mg/dL   Calcium 8.4 (L) 8.9 - 10.3 mg/dL   GFR calc non Af Amer >60 >60 mL/min   GFR calc Af Amer >60 >60 mL/min   Anion gap 7 5 - 15  Protime-INR     Status: Abnormal   Collection Time: 02/12/19  2:10 AM  Result Value Ref Range   Prothrombin Time 24.5 (H) 11.4 - 15.2 seconds   INR 2.3 (H) 0.8 - 1.2  Heparin level (unfractionated)     Status: None   Collection Time: 02/12/19  2:10 AM  Result Value Ref Range   Heparin Unfractionated 0.33 0.30 - 0.70 IU/mL     PHYSICAL EXAM:   Gen: resting comfortably in bed, NAD, pleasantly confused  Lungs: breathing unlabored  Cardiac: irregularly irregular  Ext:       Left Lower Extremity   Hinged brace fitting well  Ace and bulky dressing intact   prevena functioning    Locked in full extension   Ext warm   Motor and sensory functions intact  + DP pulse   Swelling well controlled   Assessment/Plan: 1 Day Post-Op   Active Problems:   Atrial fibrillation, chronic   HTN (hypertension)   SIRS (systemic inflammatory response syndrome) (HCC)   Osteomyelitis (HCC)   Acute blood loss anemia   Patellar tendon rupture, left, initial encounter   Anti-infectives (From admission, onward)   Start     Dose/Rate Route Frequency Ordered Stop   02/09/19 1000  ceFEPIme (MAXIPIME) 1 g in sodium chloride 0.9 % 100 mL IVPB  Status:  Discontinued     1 g 200 mL/hr over 30 Minutes Intravenous Every 8 hours 02/09/19 0239 02/11/19 1449   02/09/19 0600  vancomycin (VANCOCIN) IVPB 750 mg/150 ml premix  Status:  Discontinued     750 mg 150 mL/hr over 60 Minutes Intravenous Every 12 hours 02/09/19 0239 02/11/19 1450   02/09/19 0300  metroNIDAZOLE (FLAGYL) IVPB 500 mg  Status:  Discontinued     500 mg 100 mL/hr over 60 Minutes Intravenous Every 8 hours 02/09/19 0230 02/11/19 1435    02/09/19 0245  ceFEPIme (MAXIPIME) 2 g in sodium chloride 0.9 % 100 mL IVPB     2 g 200 mL/hr over 30 Minutes Intravenous  Once 02/09/19 0230 02/09/19 0357   02/09/19 0245  vancomycin (VANCOCIN) IVPB 1000 mg/200 mL premix  Status:  Discontinued     1,000 mg 200 mL/hr over 60 Minutes Intravenous  Once 02/09/19 0230 02/09/19 0230   02/08/19 1615  vancomycin (VANCOCIN) IVPB 1000 mg/200 mL premix     1,000 mg 200 mL/hr over 60 Minutes Intravenous  Once 02/08/19 1612 02/08/19 1914   02/08/19 1615  piperacillin-tazobactam (ZOSYN) IVPB 3.375 g     3.375 g 100 mL/hr over 30 Minutes Intravenous  Once 02/08/19 1612 02/08/19 1856    .  POD/HD#: 73  73 year old white male with impaired cognitive function, acute on chronic left knee pain   -Acute on chronic left knee pain s/p repair of L patella tendon rupture and scope debridement of meniscal tears, gout   WBAT with knee full extension, brace must be on   NO KNEE ROM   Ice and elevate for swelling and pain control  Pt has prevena on, will remove prevena on Monday   PT and OT    Float heel off bed to prevent pressure sore                - Pain management:             Titrate accordingly   On scheduled tylenol    Does not appear to have had any narcotics since surgery    - ABL anemia/Hemodynamics             stable    - Medical issues              Per hospitalist service    - DVT/PE prophylaxis:             heparin and coumdain per pharmacy  -  ID:          all cultures negative to date         Leukocytosis like related to actue inflammatory response due to surgery and fracture         abx dc'd     - Activity:             Up with assistance             WBAT L leg with brace on and locked in full extension    - FEN/GI prophylaxis/Foley/Lines:             regular diet    - Impediments to fracture healing:             Impaired cognitive function   - Dispo:             therapies   Arrange HH needs  Wife adamant about pt not  going to SNF   ? CIR eval   Russell Pigg, PA-C [phone] (C) 02/12/2019, 11:05 AM  [facility] Russell Dillon [phone] Russell Dillon (F)

## 2019-02-12 NOTE — Progress Notes (Addendum)
PROGRESS NOTE    Russell Dillon  ZOX:[id number] DOB: 1946-03-11 DOA: 02/08/2019 PCP: Marton Redwood, MD   Brief Narrative:  [phone] w/ a hx of cognitive impairment, atrial fibrillation, and bipolar disorder who was found to be increasingly weak with increasing pain in his left knee which progressed to the point he was not able to walk. He has had a chronic left knee problem. He visited his PCP's office where a CBC noted a 4 g drop in hemoglobin. He was advised to come to the ER.     In the ER the patient was mildly confused and had a temperature of 101 F w/ leukocytosis.  On exam patient's left knee was tender to touch.  Chest x-ray was unremarkable.  UA did not show any signs of infection.  MRI of the left knee was done which shows features concerning for osteomyelitis and septic arthritis of the left knee.  Arthrocentesis been done after discussing with on-call orthopedic surgeon Dr. Marcelino Scot who will be seeing patient in consult empiric antibiotics have been started after blood cultures were sent.  Patient's blood pressure was in the low normal initially improved with fluids.    Subjective: 4/3 patient awake, follows all commands.  Pleasantly confused.  Awaiting to go to CIR   Assessment & Plan:   Active Problems:   Atrial fibrillation, chronic   HTN (hypertension)   SIRS (systemic inflammatory response syndrome) (HCC)   Osteomyelitis (HCC)   Acute blood loss anemia   Patellar tendon rupture, left, initial encounter   Left patella avulsion fracture from tibial tuberosity/medial and lateral meniscal tear/ -4/3 S/ P LEFT knee surgical repair  SIRS vs Sepsis unknown etiology - Continue current antibiotics.  Per orthopedics (joint aspirate) joint not septic however given patient scheduled for surgery later this week and improvement with current antibiotic regimen will leave up to orthopedic surgery to discontinue antibiotics when they are comfortable. -Afebrile last 24 hours, although  white count still elevated could be secondary to demargination with his acute fractures  Anemia -Per family 4 gram drop in hemoglobin - Anemia panel pending - Occult blood pending - Elevated INR, 6.2 on admission, warfarin discontinued - Received dose of vitamin K - Today INR<2 started on heparin per pharmacy will discontinue 12 hours prior to surgery. -Monitor closely for bleeding. - Warfarin restarted post surgery  A. fib with RVR - On Coumadin at home see anemia - Diltiazem 360 mg daily -   DVT prophylaxis: Coumadin per pharmacy Code Status: Full Family Communication: None Disposition Plan: Per orthopedic surgery: Most likely will require SNF   Consultants:  Orthopedic trauma specialist   Procedures/Significant Events:  4/3 Acute on chronic left knee pain s/p repair of L patella tendon rupture and scope debridement of meniscal tears, gout     I have personally reviewed and interpreted all radiology studies and my findings are as above.  VENTILATOR SETTINGS: None   Cultures 3/31 LEFT knee synovial fluid negative x1 day  Antimicrobials: Anti-infectives (From admission, onward)   Start     Ordered Stop   02/09/19 1000  ceFEPIme (MAXIPIME) 1 g in sodium chloride 0.9 % 100 mL IVPB     02/09/19 0239     02/09/19 0600  vancomycin (VANCOCIN) IVPB 750 mg/150 ml premix     02/09/19 0239     02/09/19 0300  metroNIDAZOLE (FLAGYL) IVPB 500 mg     02/09/19 0230     02/09/19 0245  ceFEPIme (MAXIPIME) 2 g in sodium chloride  0.9 % 100 mL IVPB     02/09/19 0230 02/09/19 0357   02/09/19 0245  vancomycin (VANCOCIN) IVPB 1000 mg/200 mL premix  Status:  Discontinued     02/09/19 0230 02/09/19 0230   02/08/19 1615  vancomycin (VANCOCIN) IVPB 1000 mg/200 mL premix     02/08/19 1612 02/08/19 1914   02/08/19 1615  piperacillin-tazobactam (ZOSYN) IVPB 3.375 g     02/08/19 1612 02/08/19 1856       Devices    LINES / TUBES:      Continuous Infusions: . heparin 1,800  Units/hr (02/12/19 0300)  . lactated ringers 10 mL/hr at 02/11/19 0702     Objective: Vitals:   02/11/19 1905 02/12/19 0048 02/12/19 0445 02/12/19 0743  BP: 114/73 101/61 117/75   Pulse:  86 98 98  Resp: 13 18 18    Temp:  97.8 F (36.6 C) 97.6 F (36.4 C) 98 F (36.7 C)  TempSrc: Oral Oral Oral Oral  SpO2: 100% 97% 99%   Weight:   93.9 kg   Height:        Intake/Output Summary (Last 24 hours) at 02/12/2019 0856 Last data filed at 02/12/2019 0456 Gross per 24 hour  Intake 1875.18 ml  Output 2325 ml  Net -449.82 ml   Filed Weights   02/11/19 0500 02/11/19 0700 02/12/19 0445  Weight: 89.7 kg 89.7 kg 93.9 kg   General: A/O x 4 (does not know why) no acute respiratory distress  Eyes: negative scleral hemorrhage, negative anisocoria, negative icterus ENT: Negative Runny nose, negative gingival bleeding, Neck:  Negative scars, masses, torticollis, lymphadenopathy, JVD Lungs: Clear to auscultation bilaterally without wheezes or crackles Cardiovascular: Regular rate and rhythm without murmur gallop or rub normal S1 and S2 Abdomen: negative abdominal pain, nondistended, positive soft, bowel sounds, no rebound, no ascites, no appreciable mass Extremities: No significant cyanosis, clubbing, LLE in brace and Ace wrap.  Did not take down.  Patient comfortable no pain Skin: Negative rashes, lesions, ulcers Psychiatric:  Negative depression, negative anxiety, negative fatigue, negative mania, baseline dementia pleasant Central nervous system:  Cranial nerves II through XII intact, tongue/uvula midline, all extremities muscle strength 5/5, sensation intact throughout,  negative dysarthria, negative expressive aphasia, negative receptive aphasia.   .     Data Reviewed: Care during the described time interval was provided by me .  I have reviewed this patient's available data, including medical history, events of note, physical examination, and all test results as part of my evaluation.    CBC: Recent Labs  Lab 02/08/19 1401  02/09/19 0524 02/09/19 0953 02/10/19 0302 02/11/19 0304 02/12/19 0210  WBC 18.4*   < > 12.1* 12.8* 12.5* 10.5 14.1*  NEUTROABS 16.3*  --   --   --   --   --   --   HGB 10.8*   < > 9.9* 9.5* 8.8* 8.9* 9.6*  HCT 33.7*   < > 31.8* 29.1* 26.9* 26.6* 29.2*  MCV 88.2   < > 87.8 86.6 86.5 85.3 85.1  PLT 299   < > 268 275 276 300 362   < > = values in this interval not displayed.   Basic Metabolic Panel: Recent Labs  Lab 02/08/19 1401 02/09/19 0524 02/10/19 0302 02/12/19 0210  NA 133* 136 137 138  K 3.7 3.3* 3.6 3.9  CL 99 106 106 106  CO2 22 21* 23 25  GLUCOSE 138* 114* 112* 168*  BUN 26* 21 19 18   CREATININE 1.17 0.87 0.96 0.85  CALCIUM 8.9 8.4* 8.3*  8.2* 8.4*  MG  --   --  1.8  --    GFR: Estimated Creatinine Clearance: 87.8 mL/min (by C-G formula based on SCr of 0.85 mg/dL). Liver Function Tests: Recent Labs  Lab 02/08/19 1401 02/09/19 0524 02/10/19 0302  AST 26 17 19   ALT 22 18 19   ALKPHOS 97 77 74  BILITOT 1.9* 1.3* 1.4*  PROT 7.6 6.4* 6.1*  ALBUMIN 2.7* 2.1* 1.9*   No results for input(s): LIPASE, AMYLASE in the last 168 hours. No results for input(s): AMMONIA in the last 168 hours. Coagulation Profile: Recent Labs  Lab 02/08/19 1401 02/09/19 0953 02/10/19 0302 02/11/19 0304 02/12/19 0210  INR 4.3* 6.2* 1.6* 1.7* 2.3*   Cardiac Enzymes: No results for input(s): CKTOTAL, CKMB, CKMBINDEX, TROPONINI in the last 168 hours. BNP (last 3 results) No results for input(s): PROBNP in the last 8760 hours. HbA1C: No results for input(s): HGBA1C in the last 72 hours. CBG: Recent Labs  Lab 02/08/19 1410  GLUCAP 118*   Lipid Profile: No results for input(s): CHOL, HDL, LDLCALC, TRIG, CHOLHDL, LDLDIRECT in the last 72 hours. Thyroid Function Tests: Recent Labs    02/10/19 0302  TSH 4.073   Anemia Panel: No results for input(s): VITAMINB12, FOLATE, FERRITIN, TIBC, IRON, RETICCTPCT in the last 72 hours. Urine  analysis:    Component Value Date/Time   COLORURINE YELLOW 02/08/2019 1820   APPEARANCEUR CLEAR 02/08/2019 1820   LABSPEC 1.029 02/08/2019 1820   PHURINE 5.0 02/08/2019 1820   GLUCOSEU NEGATIVE 02/08/2019 1820   HGBUR LARGE (A) 02/08/2019 1820   [place] NEGATIVE 02/08/2019 [place] 02/08/2019 1820   PROTEINUR 30 (A) 02/08/2019 1820   NITRITE NEGATIVE 02/08/2019 1820   LEUKOCYTESUR NEGATIVE 02/08/2019 1820   Sepsis Labs: @LABRCNTIP (procalcitonin:4,lacticidven:4)  ) Recent Results (from the past 240 hour(s))  Blood culture (routine x 2)     Status: None (Preliminary result)   Collection Time: 02/08/19  2:00 PM  Result Value Ref Range Status   Specimen Description BLOOD LEFT FOREARM  Final   Special Requests   Final    BOTTLES DRAWN AEROBIC AND ANAEROBIC Blood Culture adequate volume   Culture   Final    NO GROWTH 3 DAYS Performed at [facility], [place] [address] [place], [place] [address]    Report Status PENDING  Incomplete  Blood culture (routine x 2)     Status: None (Preliminary result)   Collection Time: 02/08/19  4:00 PM  Result Value Ref Range Status   Specimen Description BLOOD RIGHT FOREARM  Final   Special Requests   Final    BOTTLES DRAWN AEROBIC AND ANAEROBIC Blood Culture adequate volume   Culture   Final    NO GROWTH 3 DAYS Performed at [facility], [place] [address], [place], [place] [address]    Report Status PENDING  Incomplete  Urine culture     Status: None   Collection Time: 02/08/19  6:27 PM  Result Value Ref Range Status   Specimen Description URINE, RANDOM  Final   Special Requests unknown Normal  Final   Culture   Final    NO GROWTH Performed at [facility], [address] [place], [place] [address]    Report Status 02/09/2019 FINAL  Final  Body fluid culture     Status: None   Collection Time: 02/09/19  1:43 AM  Result Value Ref Range Status   Specimen Description FLUID  SYNOVIAL LEFT KNEE  Final    Special Requests SYRINGE  Final   Gram Stain   Final    ABUNDANT WBC PRESENT, PREDOMINANTLY PMN NO ORGANISMS SEEN    Culture   Final    NO GROWTH 3 DAYS Performed at [facility], [place] [address], [place], [place] [address]    Report Status 02/12/2019 FINAL  Final  MRSA PCR Screening     Status: None   Collection Time: 02/10/19  2:31 PM  Result Value Ref Range Status   MRSA by PCR NEGATIVE NEGATIVE Final    Comment:        The GeneXpert MRSA Assay (FDA approved for NASAL specimens only), is one component of a comprehensive MRSA colonization surveillance program. It is not intended to diagnose MRSA infection nor to guide or monitor treatment for MRSA infections. Performed at [facility], [place] [address], [place], [place] [address]          Radiology Studies: Dg Knee Left Port  Result Date: 02/11/2019 CLINICAL DATA:  Status post surgery for patellar tendon rupture. EXAM: PORTABLE LEFT KNEE - 1-2 VIEW COMPARISON:  None. FINDINGS: A prominent effusion is present. There is gas in the effusion, consistent with recent surgery. The patella is repositioned. Tibial tuberosity is reattached. Vacuum device is in place. IMPRESSION: Postsurgical changes of the left knee without radiographic evidence for complication. Electronically Signed   By: San Morelle M.D.   On: 02/11/2019 13:46        Scheduled Meds: . acetaminophen  500 mg Oral Q8H  . diltiazem  360 mg Oral Daily  . docusate sodium  100 mg Oral BID  . donepezil  10 mg Oral Daily  . feeding supplement (ENSURE ENLIVE)  237 mL Oral TID BM  . finasteride  5 mg Oral q morning - 10a  . lithium carbonate  150 mg Oral QHS  . multivitamin with minerals  1 tablet Oral Daily  . omega-3 acid ethyl esters  1 g Oral QHS  . warfarin  1 mg Oral ONCE-1800  . Warfarin - Pharmacist Dosing Inpatient   Does not apply q1800   Continuous Infusions: . heparin 1,800 Units/hr (02/12/19 0300)  . lactated ringers 10 mL/hr at  02/11/19 0702     LOS: 3 days    Time spent: 40 minutes    [name], Geraldo Docker, MD [facility] Pager [phone]   If 7PM-7AM, please contact night-coverage [email or web address] Password [facility] 02/12/2019, 8:56 AM

## 2019-02-12 NOTE — Progress Notes (Addendum)
ANTICOAGULATION CONSULT NOTE - Follow Up Consult  Pharmacy Consult for Heparin/Coumadin Indication: atrial fibrillation  No Known Allergies  Patient Measurements: Height: 5\' 10"  (177.8 cm) Weight: 207 lb 0.2 oz (93.9 kg)(Pt has immobilzer that wasn't present yesterday) IBW/kg (Calculated) : 73 Heparin Dosing Weight: 89.7 kg  Vital Signs: Temp: 98 F (36.7 C) (04/03 0743) Temp Source: Oral (04/03 0743) BP: 117/75 (04/03 0445) Pulse Rate: 98 (04/03 0743)  Labs: Recent Labs    02/10/19 0302  02/11/19 0304 02/11/19 2256 02/12/19 0210  HGB 8.8*  --  8.9*  --  9.6*  HCT 26.9*  --  26.6*  --  29.2*  PLT 276  --  300  --  362  LABPROT 18.8*  --  19.3*  --  24.5*  INR 1.6*  --  1.7*  --  2.3*  HEPARINUNFRC  --    < > <0.10* 0.29* 0.33  CREATININE 0.96  --   --   --  0.85   < > = values in this interval not displayed.    Estimated Creatinine Clearance: 87.8 mL/min (by C-G formula based on SCr of 0.85 mg/dL).  Assessment: 73 yo male s/p orthopedic surgery.  Heparin/Coumadin resumed 4/2.  Anticoag: Coumadin PTA- on hold. INR 6.2 > vit K 5mg  IV > 1.6. INR 1.7 today. IV heparin can resume at 4PM post-op. Resume Coumadin. Severe drug interaction between Coumadin and Flagyl (messaged ortho PA, ortho MD, and Dr. Sherral Hammers).   - PTA warfarin dose 5 mg daily except 2.5 mg on Sundays (admit INR 6.2)  **Goal of Therapy:  Heparin level 0.3-0.7 units/ml  INR 2-3 Monitor platelets by anticoagulation protocol: Yes   Plan:  Will stop heparin since INR > 2 Coumadin 1 mg x 1 tonight. Daily heparin level, CBC, and INR.  Russell Dillon, [facility] Clinical Pharmacist Phone [phone]  02/12/2019 8:53 AM

## 2019-02-13 LAB — BASIC METABOLIC PANEL
Anion gap: 6 (ref 5–15)
BUN: 30 mg/dL — ABNORMAL HIGH (ref 8–23)
CO2: 24 mmol/L (ref 22–32)
Calcium: 8.3 mg/dL — ABNORMAL LOW (ref 8.9–10.3)
Chloride: 106 mmol/L (ref 98–111)
Creatinine, Ser: 0.92 mg/dL (ref 0.61–1.24)
GFR calc Af Amer: >60 mL/min (ref >60)
GFR calc non Af Amer: >60 mL/min (ref >60)
Glucose, Bld: 140 mg/dL — ABNORMAL HIGH (ref 70–99)
Potassium: 3.7 mmol/L (ref 3.5–5.1)
Sodium: 136 mmol/L (ref 135–145)

## 2019-02-13 LAB — CULTURE, BLOOD (ROUTINE X 2)
Culture: NO GROWTH
Culture: NO GROWTH
Special Requests: ADEQUATE
Special Requests: ADEQUATE

## 2019-02-13 LAB — VITAMIN B12: Vitamin B-12: 529 pg/mL (ref 180–914)

## 2019-02-13 LAB — CBC
HCT: 27.9 % — ABNORMAL LOW (ref 39.0–52.0)
Hemoglobin: 9.3 g/dL — ABNORMAL LOW (ref 13.0–17.0)
MCH: 28.4 pg (ref 26.0–34.0)
MCHC: 33.3 g/dL (ref 30.0–36.0)
MCV: 85.3 fL (ref 80.0–100.0)
Platelets: 421 10*3/uL — ABNORMAL HIGH (ref 150–400)
RBC: 3.27 MIL/uL — ABNORMAL LOW (ref 4.22–5.81)
RDW: 14.6 % (ref 11.5–15.5)
WBC: 18.3 10*3/uL — ABNORMAL HIGH (ref 4.0–10.5)
nRBC: 0 % (ref 0.0–0.2)

## 2019-02-13 LAB — RETICULOCYTES
Immature Retic Fract: 18.9 % — ABNORMAL HIGH (ref 2.3–15.9)
RBC.: 3.75 MIL/uL — ABNORMAL LOW (ref 4.22–5.81)
Retic Count, Absolute: 73.9 10*3/uL (ref 19.0–186.0)
Retic Ct Pct: 2 % (ref 0.4–3.1)

## 2019-02-13 LAB — FOLATE: Folate: 14.2 ng/mL (ref >5.9)

## 2019-02-13 LAB — IRON AND TIBC
Iron: 34 ug/dL — ABNORMAL LOW (ref 45–182)
Saturation Ratios: 22 % (ref 17.9–39.5)
TIBC: 155 ug/dL — ABNORMAL LOW (ref 250–450)
UIBC: 121 ug/dL

## 2019-02-13 LAB — PROTIME-INR
INR: 3.5 — ABNORMAL HIGH (ref 0.8–1.2)
Prothrombin Time: 34.4 seconds — ABNORMAL HIGH (ref 11.4–15.2)

## 2019-02-13 LAB — FERRITIN: Ferritin: 504 ng/mL — ABNORMAL HIGH (ref 24–336)

## 2019-02-13 NOTE — Progress Notes (Signed)
Physical Therapy Treatment Patient Details Name: Russell Dillon MRN: [id number] DOB: 19-Apr-1946 Today's Date: 02/13/2019    History of Present Illness Pt is a 73 y.o. M with significant PMH of dementia, atrial fibrillation, bipolar disorder who was admitted with increasing left knee pain. Now s/p repair of left patellar tendon rupture 4/2    PT Comments    Pt supine on arrival and remains confused due to underlying dementia.  Upon removal of bed sheets.  Pt noted to be resting in 15 degrees of flexion in hinged knee brace.  Brace would not lock when applied so knee immobilizer paced over to protect limb during mobility and to maintain extension.  Pt continues to require +2 assistance for functional mobility.  He did advance steps from bed to recliner and HR elevated to 146 bpm.  After mobility HR improved and pt resting comfortably in recliner chair.      Follow Up Recommendations  CIR;Supervision/Assistance - 24 hour     Equipment Recommendations  Hospital bed;Wheelchair (measurements PT);3in1 (PT)    Recommendations for Other Services Rehab consult     Precautions / Restrictions Precautions Precautions: Fall Other Brace: Left knee hinged brace locked in extension at all times. NO KNEE ROM Restrictions Weight Bearing Restrictions: Yes LLE Weight Bearing: Weight bearing as tolerated Other Position/Activity Restrictions: Immobilizer in place on top of hinged brace as hinged brace would not lock into extension.      Mobility  Bed Mobility Overal bed mobility: Needs Assistance Bed Mobility: Supine to Sit     Supine to sit: Mod assist;+2 for physical assistance     General bed mobility comments: Mod +2 with cues for hand placement to move to edge of bed.  Pt required assistance with B LEs, movement of bed pad to advance hips forward and assistance to elevate trunk into sitting.  Pt with posterior lean in sitting edge of bed.    Transfers Overall transfer level: Needs  assistance Equipment used: Rolling walker (2 wheeled) Transfers: Sit to/from Stand Sit to Stand: Max assist;+2 physical assistance;Mod assist;From elevated surface         General transfer comment: Pt required mod +2 to elevate hips into standing from elevated surface.  Pt required increased assistance for eccentric loading back to seated surface.  Pt also required facilitation of LLE forward during stand to sit.    Ambulation/Gait Ambulation/Gait assistance: Mod assist;+2 physical assistance Gait Distance (Feet): 6 Feet(from bed to recliner chair.  ) Assistive device: Rolling walker (2 wheeled) Gait Pattern/deviations: Step-to pattern;Trunk flexed;Narrow base of support;Shuffle;Decreased stride length     General Gait Details: Pt required assistance to turn and back to recliner chair.  Pt had increased difficulty backing to seated surface as he struggled with following cues for sequencing and keeping device close to his person.  Pt required assistance to back up device as he step baclward toward seated surface.     Stairs             Wheelchair Mobility    Modified Rankin (Stroke Patients Only)       Balance Overall balance assessment: Needs assistance   Sitting balance-Leahy Scale: Fair Sitting balance - Comments: required assistance to flex R knee to improve weight shift forward and to maintain sitting edge of bed.  Postural control: Posterior lean   Standing balance-Leahy Scale: Poor Standing balance comment: posterior lean with unsteady gt.  Heavily reliant on external support.  Cognition Arousal/Alertness: Awake/alert Behavior During Therapy: WFL for tasks assessed/performed Overall Cognitive Status: History of cognitive impairments - at baseline                                 General Comments: history of dementia      Exercises      General Comments        Pertinent Vitals/Pain Pain Assessment:  Faces Faces Pain Scale: Hurts even more Pain Location: LLE with movement, does not appear to be in pain when resting.  Pain Descriptors / Indicators: Discomfort;Grimacing;Guarding Pain Intervention(s): Limited activity within patient's tolerance;Repositioned    Home Living                      Prior Function            PT Goals (current goals can now be found in the care plan section) Acute Rehab PT Goals Patient Stated Goal: Pt wife would like pt to go to CIR Potential to Achieve Goals: Good Progress towards PT goals: Progressing toward goals    Frequency    Min 5X/week      PT Plan Current plan remains appropriate    Co-evaluation PT/OT/SLP Co-Evaluation/Treatment: Yes Reason for Co-Treatment: Complexity of the patient's impairments (multi-system involvement);Necessary to address cognition/behavior during functional activity;To address functional/ADL transfers          AM-PAC PT "6 Clicks" Mobility   Outcome Measure  Help needed turning from your back to your side while in a flat bed without using bedrails?: Total Help needed moving from lying on your back to sitting on the side of a flat bed without using bedrails?: Total Help needed moving to and from a bed to a chair (including a wheelchair)?: Total Help needed standing up from a chair using your arms (e.g., wheelchair or bedside chair)?: Total Help needed to walk in hospital room?: Total Help needed climbing 3-5 steps with a railing? : Total 6 Click Score: 6    End of Session Equipment Utilized During Treatment: Gait belt Activity Tolerance: Treatment limited secondary to medical complications (Comment)(tachycardia) Patient left: in bed;with call bell/phone within reach;with bed alarm set Nurse Communication: Mobility status PT Visit Diagnosis: Other abnormalities of gait and mobility (R26.89);Pain Pain - Right/Left: Left     Time: 9892-1194 PT Time Calculation (min) (ACUTE ONLY): 23  min  Charges:  $Therapeutic Activity: 8-22 mins                     Governor Rooks, PTA Acute Rehabilitation Services Pager [phone] Office [phone]     Kanya Potteiger Eli Hose 02/13/2019, 1:25 PM

## 2019-02-13 NOTE — Discharge Instructions (Signed)

## 2019-02-13 NOTE — Progress Notes (Signed)
Patient ID: Russell Dillon, male   DOB: 10-06-1946, 73 y.o.   MRN: [id number]     Subjective:  Patient reports pain as mild to moderate.  Patient is in the chair and in no acute distress.  Follows commands  Objective:   VITALS:   Vitals:   02/13/19 0500 02/13/19 0516 02/13/19 0748 02/13/19 1148  BP:  106/78 101/65 109/65  Pulse:  76 72 82  Resp:  12  18  Temp:  98.7 F (37.1 C) 97.8 F (36.6 C) 97.6 F (36.4 C)  TempSrc:  Oral Oral Oral  SpO2:  100% 98% 99%  Weight: 92.1 kg     Height:        ABD soft Sensation intact distally Dorsiflexion/Plantar flexion intact Incision: scant drainage prevena suction dressing in place  Lab Results  Component Value Date   WBC 18.3 (H) 02/13/2019   HGB 9.3 (L) 02/13/2019   HCT 27.9 (L) 02/13/2019   MCV 85.3 02/13/2019   PLT 421 (H) 02/13/2019   BMET    Component Value Date/Time   NA 136 02/13/2019 0231   NA 135 07/16/2017 1348   K 3.7 02/13/2019 0231   CL 106 02/13/2019 0231   CO2 24 02/13/2019 0231   GLUCOSE 140 (H) 02/13/2019 0231   BUN 30 (H) 02/13/2019 0231   BUN 13 07/16/2017 1348   CREATININE 0.92 02/13/2019 0231   CALCIUM 8.3 (L) 02/13/2019 0231   CALCIUM 8.2 (L) 02/10/2019 0302   GFRNONAA >60 02/13/2019 0231   GFRAA >60 02/13/2019 0231     Assessment/Plan: 2 Days Post-Op   Active Problems:   Atrial fibrillation, chronic   HTN (hypertension)   SIRS (systemic inflammatory response syndrome) (HCC)   Osteomyelitis (HCC)   Acute blood loss anemia   Patellar tendon rupture, left, initial encounter   Advance diet Up with therapy Continue plan per medicine WBAT in the brace with the knee in full extension    Lunette Stands 02/13/2019, 1:54 PM

## 2019-02-13 NOTE — Progress Notes (Signed)
ANTICOAGULATION CONSULT NOTE - Follow Up Consult  Pharmacy Consult for Heparin/Coumadin Indication: atrial fibrillation  No Known Allergies  Patient Measurements: Height: 5\' 10"  (177.8 cm) Weight: 203 lb 0.7 oz (92.1 kg) IBW/kg (Calculated) : 73 Heparin Dosing Weight: 89.7 kg  Vital Signs: Temp: 98.7 F (37.1 C) (04/04 0516) Temp Source: Oral (04/04 0516) BP: 106/78 (04/04 0516) Pulse Rate: 76 (04/04 0516)  Labs: Recent Labs    02/11/19 0304 02/11/19 2256 02/12/19 0210 02/13/19 0231  HGB 8.9*  --  9.6* 9.3*  HCT 26.6*  --  29.2* 27.9*  PLT 300  --  362 421*  LABPROT 19.3*  --  24.5* 34.4*  INR 1.7*  --  2.3* 3.5*  HEPARINUNFRC <0.10* 0.29* 0.33  --   CREATININE  --   --  0.85 0.92    Estimated Creatinine Clearance: 80.3 mL/min (by C-G formula based on SCr of 0.92 mg/dL).  Assessment:  Anticoag: Coumadin PTA. INR 6.2 > vit K 5mg  IV > 1.6. Resumed Coumadin. Severe drug interaction between Coumadin and Flagyl-last Flagyl dose 4/2. INR now 1.7>2.3>3.5. Hgb 9.3 stable. Plts 421 ok. - PTA warfarin dose 5 mg daily except 2.5 mg on Sundays (admit INR 6.2)  **Goal of Therapy:  INR 2-3 Monitor platelets by anticoagulation protocol: Yes   Plan:  Hold Coumadin tonight. Daily INR- watch for DDI w/ Flagyl  Jupiter Kabir S. Alford Highland, PharmD, BCPS Clinical Staff Pharmacist Eilene Ghazi Stillinger 02/13/2019,7:38 AM

## 2019-02-13 NOTE — Op Note (Signed)
NAME: Russell Dillon, Russell Dillon MEDICAL RECORD [id number] ACCOUNT [id number] DATE OF BIRTH:03/23/1946 FACILITY: [facility] LOCATION: [facility] PHYSICIAN:Gustavia Carie H. Irena Gaydos, MD  OPERATIVE REPORT  DATE OF PROCEDURE:  02/11/2019  PREOPERATIVE DIAGNOSES: 1.  Left patellar tendon disruption. 2.  Medial and lateral complex meniscal tears. 3.  Chronic anticoagulation.  POSTOPERATIVE DIAGNOSES:   1.  Left patellar tendon disruption. 2.  Medial and lateral complex meniscal tears. 3.  Chronic anticoagulation.  PROCEDURES: 1.  Repair of left patellar tendon. 2.  Arthroscopic partial meniscectomies of the medial and lateral menisci. 3.  Application of small wound VAC.  SURGEON:  Altamese [place], MD  ASSISTANT:  Ainsley Spinner, PA-C  ANESTHESIA:  General.  COMPLICATIONS:  None.  SPECIMENS:  None.  TOURNIQUET:  None.  DISPOSITION:  To PACU.  CONDITION:  Stable.  INDICATIONS FOR PROCEDURE:  The patient is a very pleasant 73 year old male who has had some mental status changes and several recent falls.  Because of the altered mental status and unwitnessed fall, swelling and redness of his left knee, there was a  concern about infection.  MRI instead suggested a patellar tendon disruption.  It also demonstrated rather substantial meniscal tears that could have been contributing to his falls.  I did discuss with his wife the risks and benefits of the procedure  including the possibility of infection, nerve injury, vessel injury, DVT, PE, heart attack, stroke, worsening of his mental status, prolonged intubation, and multiple others, and she strongly wished to proceed.  BRIEF SUMMARY OF PROCEDURE:  The patient was taken to the operating room where general anesthesia was induced.  His left lower extremity was prepped and draped in the usual sterile fashion.  We did place a tourniquet, but it was never inflated it during  the procedure.  Chlorhexidine wash and Betadine scrub and paint and then draping was  performed.  A timeout was held.  We made a midline incision, carried dissection down where the peritenon was split to reveal the patellar tendon.  The tendon was  completely avulsed from its insertion on the tibia but remained intact to the patella and overall looked healthier than anticipated.  I was able to reflect this and then directly evaluate the knee joint.  The cartilage surfaces were fairly well preserved  with no significant chondral defects or degeneration.  I thoroughly irrigated the knee and then evaluated the menisci directly.  The meniscal tears were quite posterior, difficult to access, and consequently the decision was made to proceed with  arthroscopy.  Here, the knee was filled with saline and the arthroscope introduced into the medial aspect of the joint.  Dry-scope technique was not sufficient for instrument visualization and instrumentation.  I was able to see the complex tear and used  a series of biters and arthroscopic shaver to remove this back to a stable edge.  It would not dislocate into the joint or produce pain or propagate or allow for propagation of further tearing.  Once the medial side had been achieved, we turned to the  lateral and here again identified the substantial tear.  An upbiter trimmed this, and then the arthroscopic shaver was used to take it back to a stable edge.  For the most part, his lateral meniscus was well preserved, and again there were no major  chondral lesions.  Ainsley Spinner, PA-C, assisted throughout this and supplied saline to the knee as well as manipulation and positioning for adequate visualization in this portion of the procedure.  We then turned our  attention to the patellar tendon.  Here a #2 FiberWire was used to secure the tendon with a Krakow-type stitch, dividing the tendon in two and doing an ascending limb on the outside and descending in the middle.  Similarly, on the  other side of the tendon we used a similar technique such that there  were 4 strands of the suture.  We then drilled 3 holes, bringing the FiberWire suture through these, extending the knee into flexion.  Prior to doing so, I did roughen up the surfaces  with the rasp to get a good bone surface to tie into and stimulate healing and formation of Sharpey's fibers.  I then secured this with the help of my assistant and took the knee through a range of motion, showing no gapping or displacement of the  tendon.  I then performed a repair of the peritenon which, because of the chronic nature, was not total but nearly so in achieving coverage.  Then, a layered closure with 0 Vicryl, 2-0 Vicryl and 2-0 nylon.  A sterile gently compressive dressing was  applied and then a Prevena wound VAC because of the patient's chronic anticoagulation.  A small wound VAC should reduce the risk of wound complications.  Again, Ainsley Spinner, PA-C, was present and assisting throughout.  The patient was taken to the PACU in  stable condition.  PROGNOSIS:  The patient's prognosis is guarded because of his mental status changes and his ability to be compliant with restrictions, but I am hopeful that we will be successful in maintaining use of the knee immobilizer and that the partial  meniscectomies will result in a less painful, more stable knee to reduce his subsequent risk of falling.  We will plan to see him back in the office in 2 weeks for removal of sutures.  LN/NUANCE  D:02/13/2019 T:02/13/2019 JOB:[id number]

## 2019-02-13 NOTE — Progress Notes (Signed)
PROGRESS NOTE    Russell Dillon  QBH:[id number] DOB: November 27, 1945 DOA: 02/08/2019 PCP: Marton Redwood, MD   Brief Narrative:  [id number] w/ a hx of cognitive impairment, atrial fibrillation, and bipolar disorder who was found to be increasingly weak with increasing pain in his left knee which progressed to the point he was not able to walk. He has had a chronic left knee problem. He visited his PCP's office where a CBC noted a 4 g drop in hemoglobin. He was advised to come to the ER.     In the ER the patient was mildly confused and had a temperature of 101 F w/ leukocytosis.  On exam patient's left knee was tender to touch.  Chest x-ray was unremarkable.  UA did not show any signs of infection.  MRI of the left knee was done which shows features concerning for osteomyelitis and septic arthritis of the left knee.  Arthrocentesis been done after discussing with on-call orthopedic surgeon Dr. Marcelino Scot who will be seeing patient in consult empiric antibiotics have been started after blood cultures were sent.  Patient's blood pressure was in the low normal initially improved with fluids.    Subjective: 4/4 A/O x4 still some confusion sitting in chair comfortably    Assessment & Plan:   Active Problems:   Atrial fibrillation, chronic   HTN (hypertension)   SIRS (systemic inflammatory response syndrome) (HCC)   Osteomyelitis (HCC)   Acute blood loss anemia   Patellar tendon rupture, left, initial encounter   Left patella avulsion fracture from tibial tuberosity/medial and lateral meniscal tear/ -4/3 S/ P LEFT knee surgical repair - Patient participated in PT today did well.  No serious complaints of pain.  SIRS vs Sepsis unknown etiology - Continue current antibiotics.  Per orthopedics (joint aspirate) joint not septic however given patient scheduled for surgery later this week and improvement with current antibiotic regimen will leave up to orthopedic surgery to discontinue antibiotics when  they are comfortable. -Antibiotics discontinued.  T-max last 24 hours 37.1 C, WBC continues to climb.  Watch extremely carefully.  If patient spikes a fever restart antibiotics Recent Labs  Lab 02/09/19 0953 02/10/19 0302 02/11/19 0304 02/12/19 0210 02/13/19 0231  WBC 12.8* 12.5* 10.5 14.1* 18.3*    Anemia -Per family 4 gram drop in hemoglobin - Anemia panel pending - Occult blood pending - Elevated INR, 6.2 on admission, warfarin discontinued - Received dose of vitamin K - Today INR<2 started on heparin per pharmacy will discontinue 12 hours prior to surgery. -Monitor closely for bleeding. - Warfarin restarted post surgery per pharmacy Recent Labs  Lab 02/09/19 0953 02/10/19 0302 02/11/19 0304 02/12/19 0210 02/13/19 0231  INR 6.2* 1.6* 1.7* 2.3* 3.5*    A. fib with RVR - On Coumadin at home see anemia - Diltiazem 360 mg daily     DVT prophylaxis: Coumadin per pharmacy Code Status: Full Family Communication: None Disposition Plan: PT/OT recommends CIR   Consultants:  Orthopedic trauma specialist   Procedures/Significant Events:  4/3 Acute on chronic left knee pain s/p repair of L patella tendon rupture and scope debridement of meniscal tears, gout      I have personally reviewed and interpreted all radiology studies and my findings are as above.  VENTILATOR SETTINGS: None   Cultures 3/31 LEFT knee synovial fluid negative x3 day 4/1 MRSA by PCR negative   Antimicrobials: Anti-infectives (From admission, onward)   Start     Stop   02/09/19 1000  ceFEPIme (MAXIPIME) 1  g in sodium chloride 0.9 % 100 mL IVPB  Status:  Discontinued     02/11/19 1449   02/09/19 0600  vancomycin (VANCOCIN) IVPB 750 mg/150 ml premix  Status:  Discontinued     02/11/19 1450   02/09/19 0300  metroNIDAZOLE (FLAGYL) IVPB 500 mg  Status:  Discontinued     02/11/19 1435   02/09/19 0245  ceFEPIme (MAXIPIME) 2 g in sodium chloride 0.9 % 100 mL IVPB     02/09/19 0357    02/09/19 0245  vancomycin (VANCOCIN) IVPB 1000 mg/200 mL premix  Status:  Discontinued     02/09/19 0230   02/08/19 1615  vancomycin (VANCOCIN) IVPB 1000 mg/200 mL premix     02/08/19 1914   02/08/19 1615  piperacillin-tazobactam (ZOSYN) IVPB 3.375 g     02/08/19 1856       Devices    LINES / TUBES:      Continuous Infusions: . lactated ringers 10 mL/hr at 02/11/19 0702     Objective: Vitals:   02/12/19 1942 02/13/19 0018 02/13/19 0500 02/13/19 0516  BP: 98/62 92/67  106/78  Pulse: 77 74  76  Resp: 13 15  12   Temp: 97.6 F (36.4 C) 97.8 F (36.6 C)  98.7 F (37.1 C)  TempSrc: Oral Oral  Oral  SpO2: 99% 99%  100%  Weight:   92.1 kg   Height:        Intake/Output Summary (Last 24 hours) at 02/13/2019 0741 Last data filed at 02/13/2019 0516 Gross per 24 hour  Intake 434.48 ml  Output 1250 ml  Net -815.52 ml   Filed Weights   02/11/19 0700 02/12/19 0445 02/13/19 0500  Weight: 89.7 kg 93.9 kg 92.1 kg   General: A/O x4, no acute respiratory distress Eyes: negative scleral hemorrhage, negative anisocoria, negative icterus ENT: Negative Runny nose, negative gingival bleeding, Neck:  Negative scars, masses, torticollis, lymphadenopathy, JVD Lungs: Clear to auscultation bilaterally without wheezes or crackles Cardiovascular: Regular rate and rhythm without murmur gallop or rub normal S1 and S2 Abdomen: negative abdominal pain, nondistended, positive soft, bowel sounds, no rebound, no ascites, no appreciable mass Extremities: Sitting in chair comfortably with left leg elevated in brace, able to wiggle toes did not take brace off for remove dressing.  Wound drain in place clear fluid Skin: Negative rashes, lesions, ulcers Psychiatric:  Negative depression, negative anxiety, negative fatigue, negative mania, mild dementia pleasant Central nervous system:  Cranial nerves II through XII intact, tongue/uvula midline, all extremities muscle strength 5/5, sensation intact  throughout,negative dysarthria, negative expressive aphasia, negative receptive aphasia.   .     Data Reviewed: Care during the described time interval was provided by me .  I have reviewed this patient's available data, including medical history, events of note, physical examination, and all test results as part of my evaluation.   CBC: Recent Labs  Lab 02/08/19 1401  02/09/19 0953 02/10/19 0302 02/11/19 0304 02/12/19 0210 02/13/19 0231  WBC 18.4*   < > 12.8* 12.5* 10.5 14.1* 18.3*  NEUTROABS 16.3*  --   --   --   --   --   --   HGB 10.8*   < > 9.5* 8.8* 8.9* 9.6* 9.3*  HCT 33.7*   < > 29.1* 26.9* 26.6* 29.2* 27.9*  MCV 88.2   < > 86.6 86.5 85.3 85.1 85.3  PLT 299   < > 275 276 300 362 421*   < > = values in this interval not displayed.  Basic Metabolic Panel: Recent Labs  Lab 02/08/19 1401 02/09/19 0524 02/10/19 0302 02/12/19 0210 02/13/19 0231  NA 133* 136 137 138 136  K 3.7 3.3* 3.6 3.9 3.7  CL 99 106 106 106 106  CO2 22 21* 23 25 24   GLUCOSE 138* 114* 112* 168* 140*  BUN 26* 21 19 18  30*  CREATININE 1.17 0.87 0.96 0.85 0.92  CALCIUM 8.9 8.4* 8.3*  8.2* 8.4* 8.3*  MG  --   --  1.8  --   --    GFR: Estimated Creatinine Clearance: 80.3 mL/min (by C-G formula based on SCr of 0.92 mg/dL). Liver Function Tests: Recent Labs  Lab 02/08/19 1401 02/09/19 0524 02/10/19 0302  AST 26 17 19   ALT 22 18 19   ALKPHOS 97 77 74  BILITOT 1.9* 1.3* 1.4*  PROT 7.6 6.4* 6.1*  ALBUMIN 2.7* 2.1* 1.9*   No results for input(s): LIPASE, AMYLASE in the last 168 hours. No results for input(s): AMMONIA in the last 168 hours. Coagulation Profile: Recent Labs  Lab 02/09/19 0953 02/10/19 0302 02/11/19 0304 02/12/19 0210 02/13/19 0231  INR 6.2* 1.6* 1.7* 2.3* 3.5*   Cardiac Enzymes: No results for input(s): CKTOTAL, CKMB, CKMBINDEX, TROPONINI in the last 168 hours. BNP (last 3 results) No results for input(s): PROBNP in the last 8760 hours. HbA1C: No results for  input(s): HGBA1C in the last 72 hours. CBG: Recent Labs  Lab 02/08/19 1410  GLUCAP 118*   Lipid Profile: No results for input(s): CHOL, HDL, LDLCALC, TRIG, CHOLHDL, LDLDIRECT in the last 72 hours. Thyroid Function Tests: No results for input(s): TSH, T4TOTAL, FREET4, T3FREE, THYROIDAB in the last 72 hours. Anemia Panel: No results for input(s): VITAMINB12, FOLATE, FERRITIN, TIBC, IRON, RETICCTPCT in the last 72 hours. Urine analysis:    Component Value Date/Time   COLORURINE YELLOW 02/08/2019 1820   APPEARANCEUR CLEAR 02/08/2019 1820   LABSPEC 1.029 02/08/2019 1820   PHURINE 5.0 02/08/2019 1820   GLUCOSEU NEGATIVE 02/08/2019 1820   HGBUR LARGE (A) 02/08/2019 1820   [place] NEGATIVE 02/08/2019 [place] 02/08/2019 1820   PROTEINUR 30 (A) 02/08/2019 1820   NITRITE NEGATIVE 02/08/2019 1820   LEUKOCYTESUR NEGATIVE 02/08/2019 1820   Sepsis Labs: @LABRCNTIP (procalcitonin:4,lacticidven:4)  ) Recent Results (from the past 240 hour(s))  Blood culture (routine x 2)     Status: None (Preliminary result)   Collection Time: 02/08/19  2:00 PM  Result Value Ref Range Status   Specimen Description BLOOD LEFT FOREARM  Final   Special Requests   Final    BOTTLES DRAWN AEROBIC AND ANAEROBIC Blood Culture adequate volume   Culture   Final    NO GROWTH 4 DAYS Performed at [facility], [place] [address] [place], [place] [address]    Report Status PENDING  Incomplete  Blood culture (routine x 2)     Status: None (Preliminary result)   Collection Time: 02/08/19  4:00 PM  Result Value Ref Range Status   Specimen Description BLOOD RIGHT FOREARM  Final   Special Requests   Final    BOTTLES DRAWN AEROBIC AND ANAEROBIC Blood Culture adequate volume   Culture   Final    NO GROWTH 4 DAYS Performed at [facility], [place] [address], [place], [place] [address]    Report Status PENDING  Incomplete  Urine culture     Status: None   Collection Time: 02/08/19  6:27 PM   Result Value Ref Range Status   Specimen Description  URINE, RANDOM  Final   Special Requests unknown Normal  Final   Culture   Final    NO GROWTH Performed at [facility], [place] [address], [place], [place] [address]    Report Status 02/09/2019 FINAL  Final  Body fluid culture     Status: None   Collection Time: 02/09/19  1:43 AM  Result Value Ref Range Status   Specimen Description FLUID SYNOVIAL LEFT KNEE  Final   Special Requests SYRINGE  Final   Gram Stain   Final    ABUNDANT WBC PRESENT, PREDOMINANTLY PMN NO ORGANISMS SEEN    Culture   Final    NO GROWTH 3 DAYS Performed at [facility], [place] [address], [place], [place] [address]    Report Status 02/12/2019 FINAL  Final  MRSA PCR Screening     Status: None   Collection Time: 02/10/19  2:31 PM  Result Value Ref Range Status   MRSA by PCR NEGATIVE NEGATIVE Final    Comment:        The GeneXpert MRSA Assay (FDA approved for NASAL specimens only), is one component of a comprehensive MRSA colonization surveillance program. It is not intended to diagnose MRSA infection nor to guide or monitor treatment for MRSA infections. Performed at [facility], [place] [address], [place], [place] [address]          Radiology Studies: Dg Knee Left Port  Result Date: 02/11/2019 CLINICAL DATA:  Status post surgery for patellar tendon rupture. EXAM: PORTABLE LEFT KNEE - 1-2 VIEW COMPARISON:  None. FINDINGS: A prominent effusion is present. There is gas in the effusion, consistent with recent surgery. The patella is repositioned. Tibial tuberosity is reattached. Vacuum device is in place. IMPRESSION: Postsurgical changes of the left knee without radiographic evidence for complication. Electronically Signed   By: San Morelle M.D.   On: 02/11/2019 13:46        Scheduled Meds: . acetaminophen  500 mg Oral Q8H  . diltiazem  360 mg Oral Daily  . docusate sodium  100 mg Oral BID  . donepezil  10 mg Oral Daily   . feeding supplement (ENSURE ENLIVE)  237 mL Oral TID BM  . finasteride  5 mg Oral q morning - 10a  . lithium carbonate  150 mg Oral QHS  . multivitamin with minerals  1 tablet Oral Daily  . omega-3 acid ethyl esters  1 g Oral QHS  . Warfarin - Pharmacist Dosing Inpatient   Does not apply q1800   Continuous Infusions: . lactated ringers 10 mL/hr at 02/11/19 0702     LOS: 4 days    Time spent: 40 minutes    WOODS, Geraldo Docker, MD [facility] Pager [phone]   If 7PM-7AM, please contact night-coverage [email or web address] Password [facility] 02/13/2019, 7:41 AM

## 2019-02-13 NOTE — Progress Notes (Signed)
Physical Therapy Treatment Patient Details Name: Russell Dillon MRN: [id number] DOB: 08-11-1946 Today's Date: 02/13/2019    History of Present Illness Pt is a 73 y.o. M with significant PMH of dementia, atrial fibrillation, bipolar disorder who was admitted with increasing left knee pain. Now s/p repair of left patellar tendon rupture 4/2    PT Comments    PTA spoke with ortho PA via cell about hinged brace not locking into extension.  PTA returned to room to place knee immobilizer.  After removal and placement of knee immobilizer.  PTA able to achieve locking to 0 degrees and replaced hinged brace for correct fit.  Straps also tightened for correct fit.  PTA educated staff to keep hinged brace in place and to not unlock the brace.  Informed ortho PA that hinged brace is now locked and in correct place with correct fit.      Follow Up Recommendations  CIR;Supervision/Assistance - 24 hour     Equipment Recommendations  Hospital bed;Wheelchair (measurements PT);3in1 (PT)    Recommendations for Other Services Rehab consult     Precautions / Restrictions Precautions Precautions: Fall Required Braces or Orthoses: Other Brace Other Brace: Left knee hinged brace locked in extension at all times. NO KNEE ROM Restrictions Weight Bearing Restrictions: Yes LLE Weight Bearing: Weight bearing as tolerated Other Position/Activity Restrictions: Removed hinged brace as it would not lock and placed Knee immobilizer.  After removal PTA able to lock into extension and replace to patient.  Readjusted straps and allowed for secure fit.      Mobility  Bed Mobility  Transfers  Ambulation/Gait    Stairs             Wheelchair Mobility    Modified Rankin (Stroke Patients Only)       Balance                            Cognition Arousal/Alertness: Awake/alert Behavior During Therapy: WFL for tasks assessed/performed Overall Cognitive Status: History of cognitive  impairments - at baseline                                 General Comments: history of dementia      Exercises      General Comments        Pertinent Vitals/Pain Pain Assessment: Faces Faces Pain Scale: Hurts even more Pain Location: LLE with movement, does not appear to be in pain when resting.  Pain Descriptors / Indicators: Discomfort;Grimacing;Guarding Pain Intervention(s): Limited activity within patient's tolerance;Repositioned    Home Living                      Prior Function            PT Goals (current goals can now be found in the care plan section) Acute Rehab PT Goals Patient Stated Goal: Pt wife would like pt to go to CIR Potential to Achieve Goals: Good Progress towards PT goals: Progressing toward goals    Frequency    Min 5X/week      PT Plan Current plan remains appropriate    Co-evaluation          AM-PAC PT "6 Clicks" Mobility   Outcome Measure  Help needed turning from your back to your side while in a flat bed without using bedrails?: Total Help needed moving from lying on your  back to sitting on the side of a flat bed without using bedrails?: Total Help needed moving to and from a bed to a chair (including a wheelchair)?: Total Help needed standing up from a chair using your arms (e.g., wheelchair or bedside chair)?: Total Help needed to walk in hospital room?: Total Help needed climbing 3-5 steps with a railing? : Total 6 Click Score: 6    End of Session Equipment Utilized During Treatment: Gait belt Activity Tolerance: Treatment limited secondary to medical complications (Comment)(tachycardia) Patient left: in bed;with call bell/phone within reach;with bed alarm set Nurse Communication: Mobility status PT Visit Diagnosis: Other abnormalities of gait and mobility (R26.89);Pain Pain - Right/Left: Left     Time: 5035-4656 PT Time Calculation (min) (ACUTE ONLY): 12 min  Charges:  $Orthotics Fit  Training: 8-22 mins                     Governor Rooks, PTA Acute Rehabilitation Services Pager [phone] Office [phone]     Russell Dillon Eli Hose 02/13/2019, 2:09 PM

## 2019-02-13 NOTE — Progress Notes (Signed)
Occupational Therapy Treatment Patient Details Name: Russell Dillon MRN: [id number] DOB: 04-15-1946 Today's Date: 02/13/2019    History of present illness Pt is a 73 y.o. M with significant PMH of dementia, atrial fibrillation, bipolar disorder who was admitted with increasing left knee pain. Now s/p repair of left patellar tendon rupture 4/2   OT comments  Upon arrival, pt supine in bed and awake but confused. In bed, noting that pt's hinged knee brace resting in 15* flexion; brace in locked position but not locking. Placing knee immobilizer on LLE to maintain extension. Pt requiring Mod-Max A +2 for functional mobility. Requiring Max A for LB dressing. Pt continues to present with decreased cognition, balance, strength, and safety. Continue to recommend CIR and will continue to follow acutely as admitted.    Follow Up Recommendations  CIR;Supervision/Assistance - 24 hour    Equipment Recommendations  3 in 1 bedside commode    Recommendations for Other Services Rehab consult    Precautions / Restrictions Precautions Precautions: Fall Required Braces or Orthoses: Other Brace Other Brace: Left knee hinged brace locked in extension at all times. NO KNEE ROM Restrictions Weight Bearing Restrictions: Yes LLE Weight Bearing: Weight bearing as tolerated Other Position/Activity Restrictions: Immobilizer in place on top of hinged brace as hinged brace would not lock into extension.         Mobility Bed Mobility Overal bed mobility: Needs Assistance Bed Mobility: Supine to Sit     Supine to sit: Mod assist;+2 for physical assistance     General bed mobility comments: Mod +2 with cues for hand placement to move to edge of bed.  Pt required assistance with B LEs, movement of bed pad to advance hips forward and assistance to elevate trunk into sitting.  Pt with posterior lean in sitting edge of bed.    Transfers Overall transfer level: Needs assistance Equipment used: Rolling  walker (2 wheeled) Transfers: Sit to/from Stand Sit to Stand: Max assist;+2 physical assistance;Mod assist;From elevated surface         General transfer comment: Pt required mod +2 to elevate hips into standing from elevated surface.  Pt required increased assistance for eccentric loading back to seated surface.  Pt also required facilitation of LLE forward during stand to sit.      Balance Overall balance assessment: Needs assistance Sitting-balance support: Feet supported Sitting balance-Leahy Scale: Fair Sitting balance - Comments: required assistance to flex R knee to improve weight shift forward and to maintain sitting edge of bed.  Postural control: Posterior lean Standing balance support: Bilateral upper extremity supported Standing balance-Leahy Scale: Poor Standing balance comment: posterior lean with unsteady gt.  Heavily reliant on external support.                             ADL either performed or assessed with clinical judgement   ADL Overall ADL's : Needs assistance/impaired                     Lower Body Dressing: Maximal assistance;Sit to/from stand;+2 for physical assistance Lower Body Dressing Details (indicate cue type and reason): Don socks Toilet Transfer: +2 for physical assistance;+2 for safety/equipment;Moderate assistance;Maximal assistance;Ambulation;RW(short distance to recliner) Toilet Transfer Details (indicate cue type and reason): Mod A +2 to stand and Max A +2 to safe descent         Functional mobility during ADLs: Maximal assistance;+2 for physical assistance;+2 for safety/equipment;Rolling walker;Moderate assistance(Short distance  to recliner) General ADL Comments: Pt continues to present with decreased cognition, balance, and strength.     Vision   Vision Assessment?: No apparent visual deficits   Perception     Praxis      Cognition Arousal/Alertness: Awake/alert Behavior During Therapy: WFL for tasks  assessed/performed Overall Cognitive Status: History of cognitive impairments - at baseline                                 General Comments: history of dementia        Exercises     Shoulder Instructions       General Comments HR elevated to 146 bpm during activity. Left hinged knee brace not locking through it was in locked position. Placing knee immobilizer over knee brace to prevent knee flexion.    Pertinent Vitals/ Pain       Pain Assessment: Faces Faces Pain Scale: Hurts even more Pain Location: LLE with movement, does not appear to be in pain when resting.  Pain Descriptors / Indicators: Discomfort;Grimacing;Guarding Pain Intervention(s): Monitored during session;Limited activity within patient's tolerance;Repositioned  Home Living                                          Prior Functioning/Environment              Frequency  Min 3X/week        Progress Toward Goals  OT Goals(current goals can now be found in the care plan section)  Progress towards OT goals: Progressing toward goals  Acute Rehab OT Goals Patient Stated Goal: Pt wife would like pt to go to CIR OT Goal Formulation: With patient Time For Goal Achievement: 02/26/19 Potential to Achieve Goals: Good ADL Goals Pt Will Perform Grooming: with set-up;sitting Pt Will Perform Upper Body Dressing: with set-up;sitting Pt Will Perform Lower Body Dressing: with mod assist;sitting/lateral leans;sit to/from stand;with adaptive equipment Pt Will Transfer to Toilet: with min assist;stand pivot transfer;bedside commode Pt/caregiver will Perform Home Exercise Program: Both right and left upper extremity;With Supervision;With written HEP provided Additional ADL Goal #1: pt will perform ADL functional task with set-upA seated at EOB or edge of recliner in unsupported sitting for 15 mins, fair balance throughout.  Plan Discharge plan remains appropriate    Co-evaluation     PT/OT/SLP Co-Evaluation/Treatment: Yes Reason for Co-Treatment: Complexity of the patient's impairments (multi-system involvement);To address functional/ADL transfers   OT goals addressed during session: ADL's and self-care      AM-PAC OT "6 Clicks" Daily Activity     Outcome Measure   Help from another person eating meals?: None Help from another person taking care of personal grooming?: A Little Help from another person toileting, which includes using toliet, bedpan, or urinal?: Total Help from another person bathing (including washing, rinsing, drying)?: A Lot Help from another person to put on and taking off regular upper body clothing?: A Little Help from another person to put on and taking off regular lower body clothing?: Total 6 Click Score: 14    End of Session Equipment Utilized During Treatment: Gait belt;Rolling walker;Left knee immobilizer(Left hinged knee brace)  OT Visit Diagnosis: Unsteadiness on feet (R26.81);Muscle weakness (generalized) (M62.81)   Activity Tolerance Patient tolerated treatment well   Patient Left in bed;with call bell/phone within reach;with bed alarm set   Nurse  Communication Mobility status        Time: 9935-7017 OT Time Calculation (min): 23 min  Charges: OT General Charges $OT Visit: 1 Visit OT Treatments $Self Care/Home Management : 8-22 mins  [place], OTR/L Acute Rehab Pager: [phone] Office: [place] 02/13/2019, 2:28 PM

## 2019-02-13 NOTE — Progress Notes (Signed)
  Speech Language Pathology Treatment: Dysphagia  Patient Details Name: Russell Dillon MRN: [id number] DOB: [birth year] Today's Date: 02/13/2019 Time: 1035-1050 SLP Time Calculation (min) (ACUTE ONLY): 15 min  Assessment / Plan / Recommendation Clinical Impression  Pt seen at bedside for follow up after BSE and surgery. Pt reports tolerance of po intake without difficulty. Pt was observed with solid trials and thin liquid. No overt s/s aspiration observed or reported. ST to sign off at this time. Please reconsult if needs arise.   HPI HPI: Pt is a 73 y.o. male admitted due to concern of sepsis; worsening left knee pain and increased swelling. Swallow eval ordered given hx of dementia and pending surgery.   PMH of dementia, hypertension, hyperlipidemia, atrial fibrillation, bipolar disorder, and small bowel obstruction. 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      SLP Plan  Discharge SLP treatment due to all goals met       Recommendations  Diet recommendations: Regular;Thin liquid Liquids provided via: Straw;Cup Medication Administration: Whole meds with liquid Supervision: Patient able to self feed;Intermittent supervision to cue for compensatory strategies Compensations: Slow rate;Small sips/bites;Minimize environmental distractions Postural Changes and/or Swallow Maneuvers: Seated upright 90 degrees                Oral Care Recommendations: Oral care BID Follow up Recommendations: None SLP Visit Diagnosis: Dysphagia, unspecified (R13.10) Plan: All goals met;Discharge SLP treatment due to (comment)       GO               Lawyer Washabaugh B. Quentin Ore [facility], CCC-SLP Speech Language Pathologist [phone]  Shonna Chock 02/13/2019, 10:53 AM

## 2019-02-14 DIAGNOSIS — F319 Bipolar disorder, unspecified: Secondary | ICD-10-CM | POA: Diagnosis present

## 2019-02-14 DIAGNOSIS — F0390 Unspecified dementia without behavioral disturbance: Secondary | ICD-10-CM | POA: Diagnosis present

## 2019-02-14 LAB — CBC
HCT: 28.6 % — ABNORMAL LOW (ref 39.0–52.0)
Hemoglobin: 9 g/dL — ABNORMAL LOW (ref 13.0–17.0)
MCH: 26.9 pg (ref 26.0–34.0)
MCHC: 31.5 g/dL (ref 30.0–36.0)
MCV: 85.6 fL (ref 80.0–100.0)
Platelets: 425 10*3/uL — ABNORMAL HIGH (ref 150–400)
RBC: 3.34 MIL/uL — ABNORMAL LOW (ref 4.22–5.81)
RDW: 14.9 % (ref 11.5–15.5)
WBC: 10 10*3/uL (ref 4.0–10.5)
nRBC: 0 % (ref 0.0–0.2)

## 2019-02-14 LAB — BASIC METABOLIC PANEL
Anion gap: 8 (ref 5–15)
BUN: 29 mg/dL — ABNORMAL HIGH (ref 8–23)
CO2: 24 mmol/L (ref 22–32)
Calcium: 8.1 mg/dL — ABNORMAL LOW (ref 8.9–10.3)
Chloride: 103 mmol/L (ref 98–111)
Creatinine, Ser: 0.91 mg/dL (ref 0.61–1.24)
GFR calc Af Amer: >60 mL/min (ref >60)
GFR calc non Af Amer: >60 mL/min (ref >60)
Glucose, Bld: 116 mg/dL — ABNORMAL HIGH (ref 70–99)
Potassium: 3.7 mmol/L (ref 3.5–5.1)
Sodium: 135 mmol/L (ref 135–145)

## 2019-02-14 LAB — MAGNESIUM: Magnesium: 2 mg/dL (ref 1.7–2.4)

## 2019-02-14 LAB — PROTIME-INR
INR: 3.4 — ABNORMAL HIGH (ref 0.8–1.2)
Prothrombin Time: 33.9 seconds — ABNORMAL HIGH (ref 11.4–15.2)

## 2019-02-14 NOTE — Progress Notes (Signed)
ANTICOAGULATION CONSULT NOTE - Follow Up Consult  Pharmacy Consult for Coumadin Indication: atrial fibrillation  No Known Allergies  Patient Measurements: Height: 5\' 10"  (177.8 cm) Weight: 205 lb 7.5 oz (93.2 kg) IBW/kg (Calculated) : 73 Heparin Dosing Weight: 89.7 kg  Vital Signs: Temp: 97.6 F (36.4 C) (04/05 0704) Temp Source: Oral (04/05 0704) BP: 99/64 (04/05 0704) Pulse Rate: 78 (04/05 0704)  Labs: Recent Labs    02/11/19 2256  02/12/19 0210 02/13/19 0231 02/14/19 0454  HGB  --    < > 9.6* 9.3* 9.0*  HCT  --   --  29.2* 27.9* 28.6*  PLT  --   --  362 421* 425*  LABPROT  --   --  24.5* 34.4* 33.9*  INR  --   --  2.3* 3.5* 3.4*  HEPARINUNFRC 0.29*  --  0.33  --   --   CREATININE  --   --  0.85 0.92 0.91   < > = values in this interval not displayed.    Estimated Creatinine Clearance: 81.7 mL/min (by C-G formula based on SCr of 0.91 mg/dL).  Assessment:  Anticoag: Coumadin PTA. INR 6.2 > vit K 5mg  IV > 1.6. Resumed Coumadin. Severe drug interaction between Coumadin and Flagyl-last Flagyl dose 4/2. INR now 1.7>2.3>3.5>3.4. Hgb 9 declining. Plts 425 ok. - PTA warfarin dose 5 mg daily except 2.5 mg on Sundays (admit INR 6.2)  **Goal of Therapy:  INR 2-3 Monitor platelets by anticoagulation protocol: Yes   Plan:  Hold Coumadin again tonight. Daily INR- watch for DDI w/ Flagyl  Swayze Kozuch S. Alford Highland, PharmD, BCPS Clinical Staff Pharmacist Eilene Ghazi [place] 02/14/2019,10:16 AM

## 2019-02-14 NOTE — Progress Notes (Signed)
PROGRESS NOTE    Russell Dillon  YJE:[id number] DOB: 11-Aug-1946 DOA: 02/08/2019 PCP: Marton Redwood, MD   Brief Narrative:  73yo WM PMHx cognitive impairment, atrial fibrillation, and bipolar disorder   who was found to be increasingly weak with increasing pain in his left knee which progressed to the point he was not able to walk. He has had a chronic left knee problem. He visited his PCP's office where a CBC noted a 4 g drop in hemoglobin. He was advised to come to the ER.     In the ER the patient was mildly confused and had a temperature of 101 F w/ leukocytosis.  On exam patient's left knee was tender to touch.  Chest x-ray was unremarkable.  UA did not show any signs of infection.  MRI of the left knee was done which shows features concerning for osteomyelitis and septic arthritis of the left knee.  Arthrocentesis been done after discussing with on-call orthopedic surgeon Dr. Marcelino Scot who will be seeing patient in consult empiric antibiotics have been started after blood cultures were sent.  Patient's blood pressure was in the low normal initially improved with fluids.    Subjective: 4/5 A/O x4 still some confusion sitting in chair comfortably.  States PT ambulated him around room today.  Some pain but tolerable    Assessment & Plan:   Active Problems:   Atrial fibrillation, chronic   HTN (hypertension)   SIRS (systemic inflammatory response syndrome) (HCC)   Osteomyelitis (HCC)   Acute blood loss anemia   Patellar tendon rupture, left, initial encounter   Bipolar 1 disorder (HCC)   Dementia without behavioral disturbance (HCC)   Left patella avulsion fracture from tibial tuberosity/medial and lateral meniscal tear/ -4/3 S/ P LEFT knee surgical repair - Per patient able to ambulate around room today -Awaiting clearance from CIR  SIRS vs Sepsis unknown etiology - Continue current antibiotics.  Per orthopedics (joint aspirate) joint not septic however given patient  scheduled for surgery later this week and improvement with current antibiotic regimen will leave up to orthopedic surgery to discontinue antibiotics when they are comfortable. -WBCs resolving Recent Labs  Lab 02/10/19 0302 02/11/19 0304 02/12/19 0210 02/13/19 0231 02/14/19 0454  WBC 12.5* 10.5 14.1* 18.3* 10.0    Anemia of chronic disease -Per family 4 gram drop in hemoglobin - Anemia panel consistent with anemia of chronic disease - Occult blood pending - Elevated INR, 6.2 on admission, warfarin discontinued - Received dose of vitamin K - Today INR<2 started on heparin per pharmacy will discontinue 12 hours prior to surgery. -Monitor closely for bleeding. - Warfarin restarted post surgery per pharmacy Recent Labs  Lab 02/10/19 0302 02/11/19 0304 02/12/19 0210 02/13/19 0231 02/14/19 0454  INR 1.6* 1.7* 2.3* 3.5* 3.4*   A. fib with RVR - On Coumadin at home see anemia - Diltiazem 360 mg daily  Dementia/Bipolar: - Donepezil 10mg  daily -Lithium 150 mg daily     DVT prophylaxis: Coumadin per pharmacy Code Status: Full Family Communication: None Disposition Plan: PT/OT recommends CIR   Consultants:  Orthopedic trauma specialist   Procedures/Significant Events:  4/3 Acute on chronic left knee pain s/p repair of L patella tendon rupture and scope debridement of meniscal tears, gout      I have personally reviewed and interpreted all radiology studies and my findings are as above.  VENTILATOR SETTINGS: None   Cultures 3/31 LEFT knee synovial fluid negative x3 day 4/1 MRSA by PCR negative   Antimicrobials: Anti-infectives (  From admission, onward)   Start     Stop   02/09/19 1000  ceFEPIme (MAXIPIME) 1 g in sodium chloride 0.9 % 100 mL IVPB  Status:  Discontinued     02/11/19 1449   02/09/19 0600  vancomycin (VANCOCIN) IVPB 750 mg/150 ml premix  Status:  Discontinued     02/11/19 1450   02/09/19 0300  metroNIDAZOLE (FLAGYL) IVPB 500 mg  Status:   Discontinued     02/11/19 1435   02/09/19 0245  ceFEPIme (MAXIPIME) 2 g in sodium chloride 0.9 % 100 mL IVPB     02/09/19 0357   02/09/19 0245  vancomycin (VANCOCIN) IVPB 1000 mg/200 mL premix  Status:  Discontinued     02/09/19 0230   02/08/19 1615  vancomycin (VANCOCIN) IVPB 1000 mg/200 mL premix     02/08/19 1914   02/08/19 1615  piperacillin-tazobactam (ZOSYN) IVPB 3.375 g     02/08/19 1856       Devices    LINES / TUBES:      Continuous Infusions: . lactated ringers 10 mL/hr at 02/11/19 0702     Objective: Vitals:   02/14/19 0449 02/14/19 0704 02/14/19 1000 02/14/19 1149  BP:  99/64  103/62  Pulse:  78 84 80  Resp:  18  16  Temp:  97.6 F (36.4 C)  98 F (36.7 C)  TempSrc:  Oral  Oral  SpO2:  99% 98% 94%  Weight: 93.2 kg     Height:        Intake/Output Summary (Last 24 hours) at 02/14/2019 1630 Last data filed at 02/14/2019 0900 Gross per 24 hour  Intake 840 ml  Output 825 ml  Net 15 ml   Filed Weights   02/12/19 0445 02/13/19 0500 02/14/19 0449  Weight: 93.9 kg 92.1 kg 93.2 kg   General: A/O x4, with some confusion (believe at baseline), no acute respiratory distress Eyes: negative scleral hemorrhage, negative anisocoria, negative icterus ENT: Negative Runny nose, negative gingival bleeding, Neck:  Negative scars, masses, torticollis, lymphadenopathy, JVD Lungs: Clear to auscultation bilaterally without wheezes or crackles Cardiovascular: Regular rate and rhythm without murmur gallop or rub normal S1 and S2 Abdomen: negative abdominal pain, nondistended, positive soft, bowel sounds, no rebound, no ascites, no appreciable mass Extremities: Sitting in chair comfortably with left leg elevated in brace, able to wiggle toes.  Did not take brace off or remove dressing.  Wound drain in place, clear fluid skin: Negative rashes, lesions, ulcers Psychiatric:  Negative depression, negative anxiety, negative fatigue, negative mania, dementia pleasant Central  nervous system:  Cranial nerves II through XII intact, tongue/uvula midline, all extremities muscle strength 5/5, sensation intact throughout,  negative dysarthria, negative expressive aphasia, negative receptive aphasia.   .     Data Reviewed: Care during the described time interval was provided by me .  I have reviewed this patient's available data, including medical history, events of note, physical examination, and all test results as part of my evaluation.   CBC: Recent Labs  Lab 02/08/19 1401  02/10/19 0302 02/11/19 0304 02/12/19 0210 02/13/19 0231 02/14/19 0454  WBC 18.4*   < > 12.5* 10.5 14.1* 18.3* 10.0  NEUTROABS 16.3*  --   --   --   --   --   --   HGB 10.8*   < > 8.8* 8.9* 9.6* 9.3* 9.0*  HCT 33.7*   < > 26.9* 26.6* 29.2* 27.9* 28.6*  MCV 88.2   < > 86.5 85.3 85.1 85.3  85.6  PLT 299   < > 276 300 362 421* 425*   < > = values in this interval not displayed.   Basic Metabolic Panel: Recent Labs  Lab 02/09/19 0524 02/10/19 0302 02/12/19 0210 02/13/19 0231 02/14/19 0454  NA 136 137 138 136 135  K 3.3* 3.6 3.9 3.7 3.7  CL 106 106 106 106 103  CO2 21* 23 25 24 24   GLUCOSE 114* 112* 168* 140* 116*  BUN 21 19 18  30* 29*  CREATININE 0.87 0.96 0.85 0.92 0.91  CALCIUM 8.4* 8.3*  8.2* 8.4* 8.3* 8.1*  MG  --  1.8  --   --  2.0   GFR: Estimated Creatinine Clearance: 81.7 mL/min (by C-G formula based on SCr of 0.91 mg/dL). Liver Function Tests: Recent Labs  Lab 02/08/19 1401 02/09/19 0524 02/10/19 0302  AST 26 17 19   ALT 22 18 19   ALKPHOS 97 77 74  BILITOT 1.9* 1.3* 1.4*  PROT 7.6 6.4* 6.1*  ALBUMIN 2.7* 2.1* 1.9*   No results for input(s): LIPASE, AMYLASE in the last 168 hours. No results for input(s): AMMONIA in the last 168 hours. Coagulation Profile: Recent Labs  Lab 02/10/19 0302 02/11/19 0304 02/12/19 0210 02/13/19 0231 02/14/19 0454  INR 1.6* 1.7* 2.3* 3.5* 3.4*   Cardiac Enzymes: No results for input(s): CKTOTAL, CKMB, CKMBINDEX, TROPONINI  in the last 168 hours. BNP (last 3 results) No results for input(s): PROBNP in the last 8760 hours. HbA1C: No results for input(s): HGBA1C in the last 72 hours. CBG: Recent Labs  Lab 02/08/19 1410  GLUCAP 118*   Lipid Profile: No results for input(s): CHOL, HDL, LDLCALC, TRIG, CHOLHDL, LDLDIRECT in the last 72 hours. Thyroid Function Tests: No results for input(s): TSH, T4TOTAL, FREET4, T3FREE, THYROIDAB in the last 72 hours. Anemia Panel: Recent Labs    02/13/19 1817  VITAMINB12 529  FOLATE 14.2  FERRITIN 504*  TIBC 155*  IRON 34*  RETICCTPCT 2.0   Urine analysis:    Component Value Date/Time   COLORURINE YELLOW 02/08/2019 1820   APPEARANCEUR CLEAR 02/08/2019 1820   LABSPEC 1.029 02/08/2019 1820   PHURINE 5.0 02/08/2019 1820   GLUCOSEU NEGATIVE 02/08/2019 1820   HGBUR LARGE (A) 02/08/2019 1820   [place] NEGATIVE 02/08/2019 1820   KETONESUR NEGATIVE 02/08/2019 1820   PROTEINUR 30 (A) 02/08/2019 1820   NITRITE NEGATIVE 02/08/2019 1820   LEUKOCYTESUR NEGATIVE 02/08/2019 1820   Sepsis Labs: @LABRCNTIP (procalcitonin:4,lacticidven:4)  ) Recent Results (from the past 240 hour(s))  Blood culture (routine x 2)     Status: None   Collection Time: 02/08/19  2:00 PM  Result Value Ref Range Status   Specimen Description BLOOD LEFT FOREARM  Final   Special Requests   Final    BOTTLES DRAWN AEROBIC AND ANAEROBIC Blood Culture adequate volume   Culture   Final    NO GROWTH 5 DAYS Performed at [facility], [place] [address], [place], [place] [address]    Report Status 02/13/2019 FINAL  Final  Blood culture (routine x 2)     Status: None   Collection Time: 02/08/19  4:00 PM  Result Value Ref Range Status   Specimen Description BLOOD RIGHT FOREARM  Final   Special Requests   Final    BOTTLES DRAWN AEROBIC AND ANAEROBIC Blood Culture adequate volume   Culture   Final    NO GROWTH 5 DAYS Performed at [facility], [place] [address] [place], [place] [address]  Report Status 02/13/2019 FINAL  Final  Urine culture     Status: None   Collection Time: 02/08/19  6:27 PM  Result Value Ref Range Status   Specimen Description URINE, RANDOM  Final   Special Requests unknown Normal  Final   Culture   Final    NO GROWTH Performed at [facility], [address] [place], [place] [address]    Report Status 02/09/2019 FINAL  Final  Body fluid culture     Status: None   Collection Time: 02/09/19  1:43 AM  Result Value Ref Range Status   Specimen Description FLUID SYNOVIAL LEFT KNEE  Final   Special Requests SYRINGE  Final   Gram Stain   Final    ABUNDANT WBC PRESENT, PREDOMINANTLY PMN NO ORGANISMS SEEN    Culture   Final    NO GROWTH 3 DAYS Performed at [facility], [place] [address] [place], [place] [address]    Report Status 02/12/2019 FINAL  Final  MRSA PCR Screening     Status: None   Collection Time: 02/10/19  2:31 PM  Result Value Ref Range Status   MRSA by PCR NEGATIVE NEGATIVE Final    Comment:        The GeneXpert MRSA Assay (FDA approved for NASAL specimens only), is one component of a comprehensive MRSA colonization surveillance program. It is not intended to diagnose MRSA infection nor to guide or monitor treatment for MRSA infections. Performed at [facility], [place] [address], [place], [place] [address]          Radiology Studies: No results found.      Scheduled Meds: . acetaminophen  500 mg Oral Q8H  . diltiazem  360 mg Oral Daily  . docusate sodium  100 mg Oral BID  . donepezil  10 mg Oral Daily  . feeding supplement (ENSURE ENLIVE)  237 mL Oral TID BM  . finasteride  5 mg Oral q morning - 10a  . lithium carbonate  150 mg Oral QHS  . multivitamin with minerals  1 tablet Oral Daily  . omega-3 acid ethyl esters  1 g Oral QHS  . Warfarin - Pharmacist Dosing Inpatient   Does not apply q1800   Continuous Infusions: . lactated ringers 10 mL/hr at 02/11/19 0702     LOS: 5 days    Time  spent: 40 minutes    WOODS, Geraldo Docker, MD [facility] Pager [phone]   If 7PM-7AM, please contact night-coverage [email or web address] Password [email or web address] 02/14/2019, 4:30 PM

## 2019-02-14 NOTE — Progress Notes (Signed)
Physical Therapy Treatment Patient Details Name: Russell Dillon MRN: [id number] DOB: [birth year] Today's Date: 02/14/2019    History of Present Illness Pt is a 73 y.o. M with significant PMH of dementia, atrial fibrillation, bipolar disorder who was admitted with increasing left knee pain. Now s/p repair of left patellar tendon rupture 4/2; Back to OR for patellar tendon repair and meniscectomies on 4/4    PT Comments    Continuing work on functional mobility and activity tolerance;  Hinged brace in place, locked in extension, velcro was bothering his skin posteriorly at the top strap; padded temporarily with a wascloth, and discussed with RN placing a foam dressing there to protect his skin; No huge jump in progress today, but that is expected the day after his second surgery; Will continue to follow   Follow Up Recommendations  CIR;Supervision/Assistance - 24 hour     Equipment Recommendations  Hospital bed;Wheelchair (measurements PT);3in1 (PT)    Recommendations for Other Services Rehab consult     Precautions / Restrictions Precautions Precautions: Fall Precaution Comments: VAC to LLE Required Braces or Orthoses: Other Brace Other Brace: Left knee hinged brace locked in extension at all times. NO KNEE ROM Restrictions LLE Weight Bearing: Weight bearing as tolerated    Mobility  Bed Mobility Overal bed mobility: Needs Assistance Bed Mobility: Supine to Sit     Supine to sit: Mod assist     General bed mobility comments: Mod with cues for hand placement to move to edge of bed.  Pt required assistance with B LEs, movement of bed pad to advance hips forward and assistance to elevate trunk into sitting.  Pt with posterior lean in sitting edge of bed.    Transfers Overall transfer level: Needs assistance Equipment used: Rolling walker (2 wheeled) Transfers: Sit to/from Stand(from high bed) Sit to Stand: +2 physical assistance;Mod assist;From elevated surface          General transfer comment: Initiated sit to stand from high bed, Heavy  mod assist of 2 to get anterior weight shift and power up; Persistent posterior lean, manually moved RW forward to help get hips over feet; physical assist to pre-position LLE in front prior to sitting down; Heavy mod assis t to control descent to sit  Ambulation/Gait Ambulation/Gait assistance: Mod assist;+2 physical assistance Gait Distance (Feet): (pivotal steps bed to chair) Assistive device: Rolling walker (2 wheeled) Gait Pattern/deviations: Step-to pattern;Trunk flexed;Narrow base of support;Shuffle;Decreased stride length     General Gait Details: Pt required assistance to turn and back to recliner chair.  Pt had increased difficulty backing to seated surface as he struggled with following cues for sequencing and keeping device close to his person.  Pt required assistance to back up device as he step baclward toward seated surface.     Stairs             Wheelchair Mobility    Modified Rankin (Stroke Patients Only)       Balance     Sitting balance-Leahy Scale: Fair Sitting balance - Comments: required assistance to flex R knee to improve weight shift forward and to maintain sitting edge of bed.  Postural control: Posterior lean   Standing balance-Leahy Scale: Poor                              Cognition Arousal/Alertness: Awake/alert Behavior During Therapy: WFL for tasks assessed/performed Overall Cognitive Status: History of cognitive impairments - at baseline  General Comments: history of dementia      Exercises      General Comments General comments (skin integrity, edema, etc.): HR somewhat elevated during activity; O2 sats greater than or equal to 92% with activity on room Air      Pertinent Vitals/Pain Pain Assessment: Faces Faces Pain Scale: Hurts little more Pain Location: LLE with movement, does not appear to be in  pain when resting.  Pain Descriptors / Indicators: Discomfort;Grimacing;Guarding Pain Intervention(s): Monitored during session    Home Living                      Prior Function            PT Goals (current goals can now be found in the care plan section) Acute Rehab PT Goals Patient Stated Goal: Pt wife would like pt to go to CIR PT Goal Formulation: With patient/family(Original goals set continue to be appropriate) Time For Goal Achievement: 02/26/19 Potential to Achieve Goals: Good Progress towards PT goals: Progressing toward goals(Slowly)    Frequency    Min 5X/week      PT Plan Current plan remains appropriate    Co-evaluation              AM-PAC PT "6 Clicks" Mobility   Outcome Measure  Help needed turning from your back to your side while in a flat bed without using bedrails?: Total Help needed moving from lying on your back to sitting on the side of a flat bed without using bedrails?: Total Help needed moving to and from a bed to a chair (including a wheelchair)?: A Lot Help needed standing up from a chair using your arms (e.g., wheelchair or bedside chair)?: Total Help needed to walk in hospital room?: Total Help needed climbing 3-5 steps with a railing? : Total 6 Click Score: 7    End of Session Equipment Utilized During Treatment: Gait belt Activity Tolerance: Patient tolerated treatment well Patient left: in chair;with call bell/phone within reach;with chair alarm set Nurse Communication: Mobility status PT Visit Diagnosis: Other abnormalities of gait and mobility (R26.89);Pain Pain - Right/Left: Left Pain - part of body: Leg     Time: 4801-6553 PT Time Calculation (min) (ACUTE ONLY): 25 min  Charges:  $Therapeutic Activity: 23-37 mins                     Roney Marion, PT  Acute Rehabilitation Services Pager [phone] Office [phone]    [place]letta [place] 02/14/2019, 11:23 AM

## 2019-02-15 ENCOUNTER — Inpatient Hospital Stay (HOSPITAL_COMMUNITY)
Admission: RE | Admit: 2019-02-15 | Discharge: 2019-03-02 | DRG: 092 | Disposition: A | Discharge status: Home Health | Payer: Medicare Other | Source: Intra-hospital | Attending: Physical Medicine & Rehabilitation | Admitting: Physical Medicine & Rehabilitation

## 2019-02-15 ENCOUNTER — Encounter (HOSPITAL_COMMUNITY): Payer: Self-pay

## 2019-02-15 ENCOUNTER — Other Ambulatory Visit: Payer: Self-pay

## 2019-02-15 DIAGNOSIS — R739 Hyperglycemia, unspecified: Secondary | ICD-10-CM | POA: Diagnosis present

## 2019-02-15 DIAGNOSIS — M109 Gout, unspecified: Secondary | ICD-10-CM | POA: Diagnosis present

## 2019-02-15 DIAGNOSIS — I482 Chronic atrial fibrillation, unspecified: Secondary | ICD-10-CM | POA: Diagnosis present

## 2019-02-15 DIAGNOSIS — E8809 Other disorders of plasma-protein metabolism, not elsewhere classified: Secondary | ICD-10-CM | POA: Diagnosis not present

## 2019-02-15 DIAGNOSIS — S86812A Strain of other muscle(s) and tendon(s) at lower leg level, left leg, initial encounter: Secondary | ICD-10-CM | POA: Diagnosis present

## 2019-02-15 DIAGNOSIS — D62 Acute posthemorrhagic anemia: Secondary | ICD-10-CM

## 2019-02-15 DIAGNOSIS — R2689 Other abnormalities of gait and mobility: Secondary | ICD-10-CM | POA: Diagnosis present

## 2019-02-15 DIAGNOSIS — Z79899 Other long term (current) drug therapy: Secondary | ICD-10-CM

## 2019-02-15 DIAGNOSIS — N400 Enlarged prostate without lower urinary tract symptoms: Secondary | ICD-10-CM | POA: Diagnosis present

## 2019-02-15 DIAGNOSIS — I10 Essential (primary) hypertension: Secondary | ICD-10-CM

## 2019-02-15 DIAGNOSIS — E785 Hyperlipidemia, unspecified: Secondary | ICD-10-CM | POA: Diagnosis present

## 2019-02-15 DIAGNOSIS — Z8249 Family history of ischemic heart disease and other diseases of the circulatory system: Secondary | ICD-10-CM

## 2019-02-15 DIAGNOSIS — F319 Bipolar disorder, unspecified: Secondary | ICD-10-CM | POA: Diagnosis present

## 2019-02-15 DIAGNOSIS — D72829 Elevated white blood cell count, unspecified: Secondary | ICD-10-CM | POA: Diagnosis present

## 2019-02-15 DIAGNOSIS — I4891 Unspecified atrial fibrillation: Secondary | ICD-10-CM

## 2019-02-15 DIAGNOSIS — S86812D Strain of other muscle(s) and tendon(s) at lower leg level, left leg, subsequent encounter: Secondary | ICD-10-CM | POA: Diagnosis not present

## 2019-02-15 DIAGNOSIS — D649 Anemia, unspecified: Secondary | ICD-10-CM | POA: Diagnosis present

## 2019-02-15 DIAGNOSIS — S76112D Strain of left quadriceps muscle, fascia and tendon, subsequent encounter: Secondary | ICD-10-CM

## 2019-02-15 DIAGNOSIS — Z7901 Long term (current) use of anticoagulants: Secondary | ICD-10-CM

## 2019-02-15 DIAGNOSIS — R509 Fever, unspecified: Secondary | ICD-10-CM

## 2019-02-15 DIAGNOSIS — F0390 Unspecified dementia without behavioral disturbance: Secondary | ICD-10-CM | POA: Diagnosis present

## 2019-02-15 DIAGNOSIS — Z952 Presence of prosthetic heart valve: Secondary | ICD-10-CM | POA: Diagnosis not present

## 2019-02-15 DIAGNOSIS — I4819 Other persistent atrial fibrillation: Secondary | ICD-10-CM

## 2019-02-15 DIAGNOSIS — F0280 Dementia in other diseases classified elsewhere without behavioral disturbance: Secondary | ICD-10-CM

## 2019-02-15 DIAGNOSIS — S86812S Strain of other muscle(s) and tendon(s) at lower leg level, left leg, sequela: Secondary | ICD-10-CM | POA: Diagnosis not present

## 2019-02-15 DIAGNOSIS — D72828 Other elevated white blood cell count: Secondary | ICD-10-CM | POA: Diagnosis not present

## 2019-02-15 DIAGNOSIS — G8918 Other acute postprocedural pain: Secondary | ICD-10-CM

## 2019-02-15 LAB — CBC
HCT: 28.6 % — ABNORMAL LOW (ref 39.0–52.0)
Hemoglobin: 9.3 g/dL — ABNORMAL LOW (ref 13.0–17.0)
MCH: 28 pg (ref 26.0–34.0)
MCHC: 32.5 g/dL (ref 30.0–36.0)
MCV: 86.1 fL (ref 80.0–100.0)
Platelets: 465 10*3/uL — ABNORMAL HIGH (ref 150–400)
RBC: 3.32 MIL/uL — ABNORMAL LOW (ref 4.22–5.81)
RDW: 15.3 % (ref 11.5–15.5)
WBC: 10.3 10*3/uL (ref 4.0–10.5)
nRBC: 0 % (ref 0.0–0.2)

## 2019-02-15 LAB — MAGNESIUM: Magnesium: 2 mg/dL (ref 1.7–2.4)

## 2019-02-15 LAB — PROTIME-INR
INR: 3 — ABNORMAL HIGH (ref 0.8–1.2)
Prothrombin Time: 30.8 seconds — ABNORMAL HIGH (ref 11.4–15.2)

## 2019-02-15 MED ORDER — ADULT MULTIVITAMIN W/MINERALS CH
1.0000 | ORAL_TABLET | Freq: Every day | ORAL | Status: DC
  Administered 2019-02-16 – 2019-03-02 (×15): 1 via ORAL
  Filled 2019-02-15 (×15): qty 1

## 2019-02-15 MED ORDER — DONEPEZIL HCL 10 MG PO TABS
10.0000 mg | ORAL_TABLET | Freq: Every day | ORAL | Status: DC
  Administered 2019-02-16 – 2019-03-02 (×15): 10 mg via ORAL
  Filled 2019-02-15 (×15): qty 1

## 2019-02-15 MED ORDER — DILTIAZEM HCL ER COATED BEADS 180 MG PO CP24
360.0000 mg | ORAL_CAPSULE | Freq: Every day | ORAL | Status: DC
  Administered 2019-02-16 – 2019-03-02 (×15): 360 mg via ORAL
  Filled 2019-02-15 (×15): qty 2

## 2019-02-15 MED ORDER — ENSURE ENLIVE PO LIQD
237.0000 mL | Freq: Three times a day (TID) | ORAL | Status: DC
  Administered 2019-02-15 – 2019-03-01 (×39): 237 mL via ORAL

## 2019-02-15 MED ORDER — ONDANSETRON HCL 4 MG/2ML IJ SOLN: 4.0000 mg | Freq: Four times a day (QID) | INTRAMUSCULAR | Status: DC | PRN

## 2019-02-15 MED ORDER — HYDROCODONE-ACETAMINOPHEN 5-325 MG PO TABS
1.0000 | ORAL_TABLET | ORAL | Status: DC | PRN
  Administered 2019-02-16 – 2019-02-20 (×3): 2 via ORAL
  Administered 2019-02-21 – 2019-02-22 (×3): 1 via ORAL
  Administered 2019-02-23 – 2019-02-24 (×2): 2 via ORAL
  Administered 2019-02-24: 16:00:00 1 via ORAL
  Administered 2019-02-24 – 2019-02-26 (×2): 2 via ORAL
  Administered 2019-02-26 – 2019-02-28 (×4): 1 via ORAL
  Administered 2019-02-28 – 2019-03-02 (×3): 2 via ORAL
  Filled 2019-02-15 (×4): qty 2
  Filled 2019-02-15: qty 1
  Filled 2019-02-15 (×2): qty 2
  Filled 2019-02-15 (×2): qty 1
  Filled 2019-02-15: qty 2
  Filled 2019-02-15: qty 1
  Filled 2019-02-15 (×2): qty 2
  Filled 2019-02-15: qty 1
  Filled 2019-02-15: qty 2
  Filled 2019-02-15 (×4): qty 1

## 2019-02-15 MED ORDER — WARFARIN - PHARMACIST DOSING INPATIENT
Freq: Every day | Status: DC
  Administered 2019-02-16 – 2019-02-27 (×9)

## 2019-02-15 MED ORDER — ONDANSETRON HCL 4 MG PO TABS: 4.0000 mg | ORAL_TABLET | Freq: Four times a day (QID) | ORAL | Status: DC | PRN

## 2019-02-15 MED ORDER — FINASTERIDE 5 MG PO TABS
5.0000 mg | ORAL_TABLET | Freq: Every morning | ORAL | Status: DC
  Administered 2019-02-16 – 2019-03-01 (×14): 5 mg via ORAL
  Filled 2019-02-15 (×15): qty 1

## 2019-02-15 MED ORDER — LITHIUM CARBONATE 150 MG PO CAPS
150.0000 mg | ORAL_CAPSULE | Freq: Every day | ORAL | Status: DC
  Administered 2019-02-15 – 2019-03-01 (×15): 150 mg via ORAL
  Filled 2019-02-15 (×15): qty 1

## 2019-02-15 MED ORDER — DOCUSATE SODIUM 100 MG PO CAPS
100.0000 mg | ORAL_CAPSULE | Freq: Two times a day (BID) | ORAL | Status: DC
  Administered 2019-02-15 – 2019-03-02 (×30): 100 mg via ORAL
  Filled 2019-02-15 (×30): qty 1

## 2019-02-15 MED ORDER — ACETAMINOPHEN 325 MG PO TABS
650.0000 mg | ORAL_TABLET | Freq: Four times a day (QID) | ORAL | Status: DC | PRN
  Administered 2019-02-16 – 2019-02-17 (×2): 650 mg via ORAL
  Filled 2019-02-15 (×3): qty 2

## 2019-02-15 MED ORDER — SORBITOL 70 % SOLN
30.0000 mL | Freq: Every day | Status: DC | PRN
  Administered 2019-02-17: 21:00:00 30 mL via ORAL
  Filled 2019-02-15: qty 30

## 2019-02-15 MED ORDER — OMEGA-3-ACID ETHYL ESTERS 1 G PO CAPS
1.0000 g | ORAL_CAPSULE | Freq: Every day | ORAL | Status: DC
  Administered 2019-02-15 – 2019-03-01 (×15): 1 g via ORAL
  Filled 2019-02-15 (×15): qty 1

## 2019-02-15 NOTE — H&P (Signed)
Physical Medicine and Rehabilitation Admission H&P    Chief Complaint  Patient presents with  . Leg Pain  . Altered Mental Status  . Fever  Chief complaint: knee pain  HPI: Russell Dillon feels a 73 year old right-handed male with history of AVR, atrial fibrillation is chronic Coumadin, bipolar disorder maintained on lithium, mild cognitive impairment maintained on Aricept.  Per chart review, patient lives with spouse. Independent up until 2 weeks ago with increasing knee pain. 2 level home. Wife with a rotator cuff tear and limited physically. Presented 01/30/2019 with increasing knee pain over the past 10 days and recent fall.Patient with low-grade fever. Hemoglobin on admission of 4, INR 4.3, WBC 18,400, BNP 220, lactic acid 1.8, urinalysis negative. Cranial CT scan negative for acute changes.CT abdomen pelvis showed no acute abnormality. Cholelithiasis without acute inflammation. Initially placed on broad-spectrum antibiotics.Coumadin was reversed with vitamin K. 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 MRI showed bilateral meniscus tear, left patella tendon tear with lateral distal femur and tibial plateau nondisplaced fractures. Patient underwent left knee patellar tendon repair, arthroscopy left knee with partial medial and lateral menisc with wound VAC application 46/50/3546 and return to the OR for patellar tendon repair meniscectomies 02/13/2019. Weightbearing as tolerated left knee hinged brace locked in extension at all times. No knee range of motion. Initially maintained on antibiotic therapy for suspect sepsis later discontinued 02/12/2019 and monitored white blood cell count normalized 10.3. leukocytosis was felt to be induced by initial fracture. Chronic Coumadin has been resumed. Hemoglobin stabilized 9.3. Therapy evaluations completed with recommendations of physical medicine rehabilitation consult. Patient was admitted for a comprehensive rehabilitation program.   Review of Systems  HENT: Negative for hearing loss.   Eyes: Negative for blurred vision and double vision.  Respiratory: Negative for cough and shortness of breath.   Gastrointestinal: Negative for heartburn, nausea and vomiting.  Genitourinary: Negative for dysuria, flank pain and hematuria.  Musculoskeletal: Positive for joint pain and myalgias.       Recent fall 2 weeks ago  Skin: Negative for rash.  Neurological: Positive for focal weakness.  Psychiatric/Behavioral: Positive for memory loss. The patient has insomnia.        Bipolar disorder  All other systems reviewed and are negative.  Past Medical History:  Diagnosis Date  . Aortic valve regurgitation 10/16/2018   Echo 07/28/2017:  EF 60-65, moderate AI, aortic root and ascending aorta mildly dilated (40 mm), ascending aorta 43 mm, MAC, mild MR, mild LAE, moderate RV enlargement, trivial TR // Echo 12/19: EF 60-65, normal wall motion, mild AI, moderate BAE, ascending aorta 43 mm, aortic root 39 mm  . BPH (benign prostatic hypertrophy)   . Chronic anticoagulation   . Chronic atrial fibrillation   . Diastolic dysfunction October 2010   Normal LV systolic function  . Hx SBO   . Hyperlipidemia   . Hypertension   . Patellar tendon rupture, left, initial encounter 02/10/2019  . Small bowel obstruction [facility])    Past Surgical History:  Procedure Laterality Date  . APPENDECTOMY  2004  . APPLICATION OF WOUND VAC Left 02/11/2019   Procedure: Application Of Wound Vac;  Surgeon: Altamese Wisner, MD;  Location: [place];  Service: Orthopedics;  Laterality: Left;  . CARDIOVASCULAR STRESS TEST  09/30/2007   EF 67%  No ischemia.   Marland Kitchen CARDIOVERSION  05/01/2004  . KNEE ARTHROSCOPY Left 02/11/2019   Procedure: ARTHROSCOPY LEFT KNEE;  Surgeon: Altamese [place], MD;  Location: [place];  Service:  Orthopedics;  Laterality: Left;  . PATELLAR TENDON REPAIR Left 02/11/2019   Procedure: LEFT PATELLA TENDON REPAIR;  Surgeon: Altamese [place], MD;  Location: [place];   Service: Orthopedics;  Laterality: Left;  . SMALL INTESTINE SURGERY  2004  . US ECHOCARDIOGRAPHY  09/05/2009   ef 55-60%   Family History  Problem Relation Age of Onset  . Heart attack Mother   . Hypertension Mother   . Diabetes Father   . Heart disease Father   . Colon cancer Neg Hx   . Esophageal cancer Neg Hx   . Pancreatic cancer Neg Hx   . Prostate cancer Neg Hx   . Rectal cancer Neg Hx   . Stomach cancer Neg Hx    Social History:  reports that he has never smoked. He has never used smokeless tobacco. He reports that he does not drink alcohol or use drugs. Allergies: No Known Allergies Medications Prior to Admission  Medication Sig Dispense Refill  . Acetylcysteine (NAC 600 PO) Take 1 tablet by mouth at bedtime.    . Cholecalciferol (VITAMIN D3 PO) Take 1 tablet by mouth 2 (two) times daily.    Marland Kitchen diltiazem (CARDIZEM CD) 360 MG 24 hr capsule Take 1 capsule (360 mg total) by mouth daily. 90 capsule 3  . donepezil (ARICEPT) 10 MG tablet Take 1 tablet (10 mg total) by mouth daily. 90 tablet 3  . finasteride (PROSCAR) 5 MG tablet Take 5 mg by mouth every morning.    . lithium carbonate 150 MG capsule Take 1 capsule (150 mg total) by mouth at bedtime. 90 capsule 0  . Multiple Vitamins-Minerals (ICAPS AREDS 2 PO) Take 1 tablet by mouth daily.    . Omega-3 Fatty Acids (OMEGA-3 FISH OIL PO) Take 1 tablet by mouth at bedtime.    Marland Kitchen warfarin (COUMADIN) 5 MG tablet TAKE AS DIRECTED BY COUMADIN CLINIC (Patient taking differently: Take 2.5-5 mg by mouth See admin instructions. Take 1 tablet by mouth on Monday- Saturday  Take 1/2 tablet by mouth on Sunday) 90 tablet 1  . lactose free nutrition (BOOST PLUS) LIQD Take 237 mLs by mouth 3 (three) times daily between meals.  0    Drug Regimen Review Drug regimen was reviewed and remains appropriate with no significant issues identified  Home: Home Living Family/patient expects to be discharged to:: Private residence Living Arrangements:  Spouse/significant other Available Help at Discharge: Family, Available PRN/intermittently Type of Home: House Home Access: Stairs to enter Home Layout: Two level Bathroom Shower/Tub: Insurance claims handler: Bedside commode, Hospital bed, Wheelchair - manual Additional Comments: Pt wife has current rotator cuff tear   Functional History: Prior Function Level of Independence: Needs assistance Gait / Transfers Assistance Needed: wife has been assisting with transfers since onset of left knee pain ADL's / Homemaking Assistance Needed: wife assists with ADL's  Functional Status:  Mobility: Bed Mobility Overal bed mobility: Needs Assistance Bed Mobility: Supine to Sit Supine to sit: Min assist Sit to supine: Max assist, +2 for physical assistance General bed mobility comments: HOB up, increased time, assist for scooting L hip to EOB Transfers Overall transfer level: Needs assistance Equipment used: Rolling walker (2 wheeled) Transfers: Sit to/from Stand Sit to Stand: +2 physical assistance, Mod assist, From elevated surface General transfer comment: physical assist to position R LE and for hand placement, assist to rise and steady Ambulation/Gait Ambulation/Gait assistance: Mod assist, +2 physical assistance Gait Distance (Feet): (pivotal steps bed to chair) Assistive device: Rolling walker (2 wheeled)  Gait Pattern/deviations: Step-to pattern, Trunk flexed, Narrow base of support, Shuffle, Decreased stride length General Gait Details: Pt required assistance to turn and back to recliner chair.  Pt had increased difficulty backing to seated surface as he struggled with following cues for sequencing and keeping device close to his person.  Pt required assistance to back up device as he step baclward toward seated surface.      ADL: ADL Overall ADL's : Needs assistance/impaired Eating/Feeding: Set up, Sitting Grooming: Set up, Sitting Upper Body Bathing: Moderate assistance,  Sitting Lower Body Bathing: Maximal assistance, Total assistance, Sitting/lateral leans, Sit to/from stand Upper Body Dressing : Moderate assistance, Sitting Lower Body Dressing: Total assistance, Bed level Lower Body Dressing Details (indicate cue type and reason): brace, socks Toilet Transfer: +2 for physical assistance, +2 for safety/equipment, Moderate assistance, Maximal assistance, Ambulation, RW(short distance to recliner) Toilet Transfer Details (indicate cue type and reason): Mod A +2 to stand and Max A +2 to safe descent Toileting- Clothing Manipulation and Hygiene: Maximal assistance, Sitting/lateral lean, Sit to/from stand Functional mobility during ADLs: Maximal assistance, +2 for physical assistance, +2 for safety/equipment, Rolling walker, Moderate assistance(Short distance to recliner) General ADL Comments: Pt continues to present with decreased cognition, balance, and strength.  Cognition: Cognition Overall Cognitive Status: History of cognitive impairments - at baseline Orientation Level: Oriented to person, Oriented to situation, Oriented to place, Disoriented to time Cognition Arousal/Alertness: Awake/alert Behavior During Therapy: Anxious Overall Cognitive Status: History of cognitive impairments - at baseline General Comments: impaired direction following, requires frequent repetition  Physical Exam: Blood pressure (!) 108/57, pulse 70, temperature 98.3 F (36.8 C), temperature source Oral, resp. rate 15, height _0  (1.778 m), weight 92.5 kg, SpO2 94 %. Physical Exam  Vitals reviewed. Constitutional: He appears well-developed and well-nourished.  HENT:  Head: Normocephalic and atraumatic.  Eyes: EOM are normal. Right eye exhibits no discharge. Left eye exhibits no discharge.  Respiratory: Effort normal.  GI: He exhibits no distension.  Musculoskeletal:     Comments: Left lower extremity with edema and tenderness  Neurological: He is alert.  Patient is  pleasantly confused.  Oriented x1  Follow simple commands Motor: Right lower extremity: 4+/5 proximal distal Left lower extremity: Hip flexion 2+/5, knee locked in brace, ankle dorsiflexion 4/5  Skin:  Left knee with immobilizer in place.  Dressing C/D/I  Psychiatric: His speech is delayed. He is slowed. Cognition and memory are impaired.    Results for orders placed or performed during the hospital encounter of 02/08/19 (from the past 48 hour(s))  Vitamin B12     Status: None   Collection Time: 02/13/19  6:17 PM  Result Value Ref Range   Vitamin B-12 529 180 - 914 pg/mL    Comment: (NOTE) This assay is not validated for testing neonatal or myeloproliferative syndrome specimens for Vitamin B12 levels. Performed at [facility], [place] [address] [place], [place] [address]   Folate     Status: None   Collection Time: 02/13/19  6:17 PM  Result Value Ref Range   Folate 14.2 >5.9 ng/mL    Comment: Performed at [facility], [place] [address], [place], [place] [address]  Iron and TIBC     Status: Abnormal   Collection Time: 02/13/19  6:17 PM  Result Value Ref Range   Iron 34 (L) 45 - 182 ug/dL   TIBC 155 (L) 250 - 450 ug/dL   Saturation Ratios 22 17.9 - 39.5 %   UIBC  121 ug/dL    Comment: Performed at [facility], [place] [address] [place], [place] [address]  Ferritin     Status: Abnormal   Collection Time: 02/13/19  6:17 PM  Result Value Ref Range   Ferritin 504 (H) 24 - 336 ng/mL    Comment: Performed at [facility], [place] [address] [place], [place] [address]  Reticulocytes     Status: Abnormal   Collection Time: 02/13/19  6:17 PM  Result Value Ref Range   Retic Ct Pct 2.0 0.4 - 3.1 %   RBC. 3.75 (L) 4.22 - 5.81 MIL/uL   Retic Count, Absolute 73.9 19.0 - 186.0 K/uL   Immature Retic Fract 18.9 (H) 2.3 - 15.9 %    Comment: Performed at [place] [address], [place], [place] [address]  CBC     Status: Abnormal   Collection Time: 02/14/19   4:54 AM  Result Value Ref Range   WBC 10.0 4.0 - 10.5 K/uL   RBC 3.34 (L) 4.22 - 5.81 MIL/uL   Hemoglobin 9.0 (L) 13.0 - 17.0 g/dL   HCT 28.6 (L) 39.0 - 52.0 %   MCV 85.6 80.0 - 100.0 fL   MCH 26.9 26.0 - 34.0 pg   MCHC 31.5 30.0 - 36.0 g/dL   RDW 14.9 11.5 - 15.5 %   Platelets 425 (H) 150 - 400 K/uL   nRBC 0.0 0.0 - 0.2 %    Comment: Performed at [facility], [place] [address] [place], [place] [address]  Basic metabolic panel     Status: Abnormal   Collection Time: 02/14/19  4:54 AM  Result Value Ref Range   Sodium 135 135 - 145 mmol/L   Potassium 3.7 3.5 - 5.1 mmol/L   Chloride 103 98 - 111 mmol/L   CO2 24 22 - 32 mmol/L   Glucose, Bld 116 (H) 70 - 99 mg/dL   BUN 29 (H) 8 - 23 mg/dL   Creatinine, Ser 0.91 0.61 - 1.24 mg/dL   Calcium 8.1 (L) 8.9 - 10.3 mg/dL   GFR calc non Af Amer >60 >60 mL/min   GFR calc Af Amer >60 >60 mL/min   Anion gap 8 5 - 15    Comment: Performed at [facility], [place] [address], [place], [place] [address]  Protime-INR     Status: Abnormal   Collection Time: 02/14/19  4:54 AM  Result Value Ref Range   Prothrombin Time 33.9 (H) 11.4 - 15.2 seconds   INR 3.4 (H) 0.8 - 1.2    Comment: (NOTE) INR goal varies based on device and disease states. Performed at [facility], [place] [address], [place], [place] [address]   Magnesium     Status: None   Collection Time: 02/14/19  4:54 AM  Result Value Ref Range   Magnesium 2.0 1.7 - 2.4 mg/dL    Comment: Performed at [place] [address] [place], [place] [address]  CBC     Status: Abnormal   Collection Time: 02/15/19  2:40 AM  Result Value Ref Range   WBC 10.3 4.0 - 10.5 K/uL   RBC 3.32 (L) 4.22 - 5.81 MIL/uL   Hemoglobin 9.3 (L) 13.0 - 17.0 g/dL   HCT 28.6 (L) 39.0 - 52.0 %   MCV 86.1 80.0 - 100.0 fL   MCH 28.0 26.0 - 34.0 pg   MCHC 32.5 30.0 - 36.0 g/dL   RDW 15.3  11.5 - 15.5 %   Platelets 465 (H) 150 - 400 K/uL   nRBC 0.0 0.0 - 0.2 %    Comment: Performed at [facility], [place] [address], [place], [place] [address]  Protime-INR     Status: Abnormal   Collection Time: 02/15/19  2:40 AM  Result Value Ref Range   Prothrombin Time 30.8 (H) 11.4 - 15.2 seconds   INR 3.0 (H) 0.8 - 1.2    Comment: (NOTE) INR goal varies based on device and disease states. Performed at [facility], [place] [address] [place], [place] [address]   Magnesium     Status: None   Collection Time: 02/15/19  2:40 AM  Result Value Ref Range   Magnesium 2.0 1.7 - 2.4 mg/dL    Comment: Performed at [facility] [address], [place], [place] [address]   No results found.     Medical Problem List and Plan: 1.  Decreased functional mobility secondary to acute on chronic left knee pain status post repair of left patellar tendon rupture and scope debridement of meniscal tears 02-2019 and 02/13/2019. Weightbearing as tolerated with full knee extension brace must be on an full extension no knee range of motion.  Admit to CIR 2.  Antithrombotics: -DVT/anticoagulation:  Chronic Coumadin  -antiplatelet therapy: N/A 3. Pain Management: hydrocodone as needed 4. Mood: Aricept 10 mg daily  -antipsychotic agents: lithium 150 mg daily 5. Neuropsych: This patient is not capable of making decisions on his own behalf. 6. Skin/Wound Care:  Routine skin checks 7. Fluids/Electrolytes/Nutrition:  Routine ins and outs with follow-up chemistries in the a.m. 8. Acute on chronic anemia. Follow-up CBC in the a.m. 9. Atrial fibrillation. Cardizem 360 mg daily. Cardiac rate controlled.  Monitor with increased mobility 10. BPH. Pro scar 11. Hyperlipidemia. [place]  Lavon Paganini Angiulli, PA-C 02/15/2019

## 2019-02-15 NOTE — Progress Notes (Signed)
Physical Therapy Treatment Patient Details Name: Russell Dillon MRN: [id number] DOB: 06-26-1946 Today's Date: 02/15/2019    History of Present Illness Pt is a 73 y.o. M with significant PMH of dementia, atrial fibrillation, bipolar disorder who was admitted with increasing left knee pain. Now s/p repair of left patellar tendon rupture 4/2; Back to OR for patellar tendon repair and meniscectomies on 4/4    PT Comments    Patient seen for activity progression and pre gait training. Assisted patient with positioning of hinged brace and padding due to increased rubbing/chaffing from brace. Patient assist OOb to chair. Continues to required +2 physical assist and multi modal cues. Current POC remains appropriate.   Follow Up Recommendations  CIR;Supervision/Assistance - 24 hour     Equipment Recommendations  Hospital bed;Wheelchair (measurements PT);3in1 (PT)    Recommendations for Other Services Rehab consult     Precautions / Restrictions Precautions Precautions: Fall Precaution Comments: maternity pads used to prevent [name] from rubbing Required Braces or Orthoses: Other Brace Other Brace: Left knee hinged brace locked in extension at all times. NO KNEE ROM Restrictions Weight Bearing Restrictions: Yes LLE Weight Bearing: Weight bearing as tolerated    Mobility  Bed Mobility Overal bed mobility: Needs Assistance Bed Mobility: Supine to Sit     Supine to sit: Min assist     General bed mobility comments: HOB up, increased time, assist for scooting L hip to EOB  Transfers Overall transfer level: Needs assistance Equipment used: Rolling walker (2 wheeled) Transfers: Sit to/from Stand Sit to Stand: +2 physical assistance;Mod assist;From elevated surface         General transfer comment: physical assist to position R LE and for hand placement, assist to rise and steady  Ambulation/Gait Ambulation/Gait assistance: Mod assist;+2 physical assistance Gait Distance  (Feet): (pivotal steps to chair) Assistive device: Rolling walker (2 wheeled) Gait Pattern/deviations: Step-to pattern;Trunk flexed;Narrow base of support;Shuffle;Decreased stride length     General Gait Details: Pt required assistance to turn and back to recliner chair.  Pt had increased difficulty backing to seated surface as he struggled with following cues for sequencing and keeping device close to his person.  Pt required assistance to back up device as he step baclward toward seated surface.     Stairs             Wheelchair Mobility    Modified Rankin (Stroke Patients Only)       Balance Overall balance assessment: Needs assistance Sitting-balance support: Feet supported Sitting balance-Leahy Scale: Fair       Standing balance-Leahy Scale: Poor Standing balance comment: verbal and tactile cues for upright posture, heavy reliance on B UEs on RW                            Cognition Arousal/Alertness: Awake/alert Behavior During Therapy: Anxious Overall Cognitive Status: History of cognitive impairments - at baseline                                 General Comments: impaired direction following, requires frequent repetition      Exercises Other Exercises Other Exercises: Pre gait static standing with weight shifts ~30 seconds Other Exercises: cues for BLE ankle pumps    General Comments        Pertinent Vitals/Pain Pain Assessment: Faces Faces Pain Scale: Hurts little more Pain Location: LLE with movement, does not  appear to be in pain when resting.  Pain Descriptors / Indicators: Discomfort;Grimacing;Guarding Pain Intervention(s): Monitored during session    Home Living                      Prior Function            PT Goals (current goals can now be found in the care plan section) Acute Rehab PT Goals Patient Stated Goal: Pt wife would like pt to go to CIR PT Goal Formulation: With patient/family(Original  goals set continue to be appropriate) Time For Goal Achievement: 02/26/19 Potential to Achieve Goals: Good Progress towards PT goals: Progressing toward goals    Frequency    Min 5X/week      PT Plan Current plan remains appropriate    Co-evaluation PT/OT/SLP Co-Evaluation/Treatment: Yes Reason for Co-Treatment: Necessary to address cognition/behavior during functional activity PT goals addressed during session: Mobility/safety with mobility OT goals addressed during session: ADL's and self-care      AM-PAC PT "6 Clicks" Mobility   Outcome Measure  Help needed turning from your back to your side while in a flat bed without using bedrails?: Total Help needed moving from lying on your back to sitting on the side of a flat bed without using bedrails?: Total Help needed moving to and from a bed to a chair (including a wheelchair)?: A Lot Help needed standing up from a chair using your arms (e.g., wheelchair or bedside chair)?: Total Help needed to walk in hospital room?: Total Help needed climbing 3-5 steps with a railing? : Total 6 Click Score: 7    End of Session Equipment Utilized During Treatment: Gait belt Activity Tolerance: Patient tolerated treatment well Patient left: in chair;with call bell/phone within reach;with chair alarm set Nurse Communication: Mobility status PT Visit Diagnosis: Other abnormalities of gait and mobility (R26.89);Pain Pain - Right/Left: Left Pain - part of body: Leg     Time: 0272-5366 PT Time Calculation (min) (ACUTE ONLY): 24 min  Charges:  $Therapeutic Activity: 8-22 mins                     Alben Deeds, PT DPT  Board Certified Neurologic Specialist Acute Rehabilitation Services Pager [phone] Office [phone]    Duncan Dull 02/15/2019, 2:01 PM

## 2019-02-15 NOTE — Progress Notes (Signed)
Occupational Therapy Treatment Patient Details Name: Russell Dillon MRN: [id number] DOB: [birth year] Today's Date: 02/15/2019    History of present illness Pt is a 73 y.o. M with significant PMH of dementia, atrial fibrillation, bipolar disorder who was admitted with increasing left knee pain. Now s/p repair of left patellar tendon rupture 4/2; Back to OR for patellar tendon repair and meniscectomies on 4/4   OT comments  Pt willing to work with therapies even after just being cleaned up and linens changed with pt reporting fatigue after rolling. Pt with improved static standing tolerance with RW. Multimodal cues needed for upright standing posture. Impaired cognition impedes ability to sequence bed mobility and sit<>stand. VSS throughout session.  Follow Up Recommendations  CIR;Supervision/Assistance - 24 hour    Equipment Recommendations  3 in 1 bedside commode    Recommendations for Other Services      Precautions / Restrictions Precautions Precautions: Fall Precaution Comments: maternity pads used to prevent [name] from rubbing Required Braces or Orthoses: Other Brace Other Brace: Left knee hinged brace locked in extension at all times. NO KNEE ROM Restrictions Weight Bearing Restrictions: Yes LLE Weight Bearing: Weight bearing as tolerated       Mobility Bed Mobility Overal bed mobility: Needs Assistance Bed Mobility: Supine to Sit     Supine to sit: Min assist     General bed mobility comments: HOB up, increased time, assist for scooting L hip to EOB  Transfers Overall transfer level: Needs assistance Equipment used: Rolling walker (2 wheeled) Transfers: Sit to/from Stand Sit to Stand: +2 physical assistance;Mod assist;From elevated surface         General transfer comment: physical assist to position R LE and for hand placement, assist to rise and steady    Balance Overall balance assessment: Needs assistance Sitting-balance support: Feet  supported Sitting balance-Leahy Scale: Fair       Standing balance-Leahy Scale: Poor Standing balance comment: verbal and tactile cues for upright posture, heavy reliance on B UEs on RW                           ADL either performed or assessed with clinical judgement   ADL                       Lower Body Dressing: Total assistance;Bed level Lower Body Dressing Details (indicate cue type and reason): brace, socks                     Vision       Perception     Praxis      Cognition Arousal/Alertness: Awake/alert Behavior During Therapy: Anxious Overall Cognitive Status: History of cognitive impairments - at baseline                                 General Comments: impaired direction following, requires frequent repetition        Exercises     Shoulder Instructions       General Comments      Pertinent Vitals/ Pain       Pain Assessment: Faces Faces Pain Scale: Hurts a little bit Pain Location: LLE with movement, does not appear to be in pain when resting.  Pain Descriptors / Indicators: Discomfort;Grimacing;Guarding Pain Intervention(s): Monitored during session;Repositioned  Home Living  Prior Functioning/Environment              Frequency  Min 3X/week        Progress Toward Goals  OT Goals(current goals can now be found in the care plan section)  Progress towards OT goals: Progressing toward goals  Acute Rehab OT Goals Patient Stated Goal: Pt wife would like pt to go to CIR OT Goal Formulation: With patient Time For Goal Achievement: 02/26/19 Potential to Achieve Goals: Good  Plan Discharge plan remains appropriate    Co-evaluation    PT/OT/SLP Co-Evaluation/Treatment: Yes Reason for Co-Treatment: Necessary to address cognition/behavior during functional activity;For patient/therapist safety   OT goals addressed during session:  Strengthening/ROM      AM-PAC OT "6 Clicks" Daily Activity     Outcome Measure   Help from another person eating meals?: None Help from another person taking care of personal grooming?: A Little Help from another person toileting, which includes using toliet, bedpan, or urinal?: Total Help from another person bathing (including washing, rinsing, drying)?: A Lot Help from another person to put on and taking off regular upper body clothing?: A Little Help from another person to put on and taking off regular lower body clothing?: Total 6 Click Score: 14    End of Session Equipment Utilized During Treatment: Gait belt;Rolling walker;Other (comment)(Hinged knee brace)  OT Visit Diagnosis: Unsteadiness on feet (R26.81);Muscle weakness (generalized) (M62.81)   Activity Tolerance Patient tolerated treatment well   Patient Left in chair;with call bell/phone within reach;with chair alarm set;Other (comment)(PA in room)   Nurse Communication          Time: 1791-5056 OT Time Calculation (min): 25 min  Charges: OT General Charges $OT Visit: 1 Visit OT Treatments $Therapeutic Activity: 8-22 mins  [name], OTR/L Acute Rehabilitation Services Pager: [phone] Office: [phone]   [name] 02/15/2019, 11:42 AM

## 2019-02-15 NOTE — Progress Notes (Signed)
ANTICOAGULATION CONSULT NOTE - Follow Up Consult  Pharmacy Consult for Coumadin Indication: atrial fibrillation  No Known Allergies  Patient Measurements: Height: 5\' 10"  (177.8 cm) Weight: 203 lb 14.8 oz (92.5 kg) IBW/kg (Calculated) : 73 Heparin Dosing Weight: 89.7 kg  Vital Signs: Temp: 98.3 F (36.8 C) (04/06 0445) Temp Source: Oral (04/06 0445) BP: 103/58 (04/06 0445) Pulse Rate: 70 (04/05 2358)  Labs: Recent Labs    02/13/19 0231 02/14/19 0454 02/15/19 0240  HGB 9.3* 9.0* 9.3*  HCT 27.9* 28.6* 28.6*  PLT 421* 425* 465*  LABPROT 34.4* 33.9* 30.8*  INR 3.5* 3.4* 3.0*  CREATININE 0.92 0.91  --     Estimated Creatinine Clearance: 81.4 mL/min (by C-G formula based on SCr of 0.91 mg/dL).  Assessment: 73 yo male on chronic Coumadin PTA. INR 6.2 > vit K 5mg  IV > 1.6. Resumed Coumadin. Severe drug interaction between Coumadin and Flagyl-last Flagyl dose 4/2. INR now 3.   - PTA warfarin dose 5 mg daily except 2.5 mg on Sundays (admit INR 6.2)  **Goal of Therapy:  INR 2-3 Monitor platelets by anticoagulation protocol: Yes   Plan:  Hold Coumadin again tonight.  Will likely need to resume tomorrow. Daily INR  [place] Crane, Roylene Reason, [facility] Clinical Pharmacist Phone [phone]  02/15/2019 9:26 AM

## 2019-02-15 NOTE — TOC Transition Note (Signed)
Transition of Care [facility]) - CM/SW Discharge Note Marvetta Gibbons RN, BSN Transitions of Care Unit 4E- RN Case Manager [phone]  Patient Details  Name: LYDIA MENG MRN: [id number] Date of Birth: 1946-02-09  Transition of [facility]) CM/SW Contact:  Dawayne Patricia, RN Phone Number: 02/15/2019, 2:20 PM   Clinical Narrative:    Pt admitted with SIRS, Patella avulsion fx s/p surgical repair 4/3-  CIR consulted for possible admission as pt lives at home with wife and wife does not want pt going to STSNF in light of COVID outbreak. CIR consult completed today and bed offered to wife for rehab- per Pamala Hurry with IP rehab here at [facility] they can admit pt today. MD has cleared pt for admission to [facility] IP rehab.  Final next level of care: IP Rehab Facility Barriers to Discharge: No Barriers Identified   Patient Goals and CMS Choice   CMS Medicare.gov Compare Post Acute Care list provided to:: Patient Represenative (must comment)(verbally over the phone) Choice offered to / list presented to : Spouse  Discharge Placement    [facility] IP rehab                   Discharge Plan and Services In-house Referral: Clinical Social Work   Post Acute Care Choice: IP Rehab                    Social Determinants of Health (SDOH) Interventions     Readmission Risk Interventions Readmission Risk Prevention Plan 02/15/2019  Transportation Screening Complete  PCP or Specialist Appt within 5-7 Days Complete  Home Care Screening Complete  Medication Review (RN CM) Complete  Some recent data might be hidden

## 2019-02-15 NOTE — Progress Notes (Signed)
Inpatient Rehabilitation Admissions Coordinator  Inpatient Rehab Consult received. I met with patient at the bedside for rehabilitation assessment and spoke with his wife by phone. We discussed goals and expectations of an inpatient rehab admission.  Wife prefers an inpt rehab admit rather than SNF. I will contact Dr. Thereasa Solo to clarify if I can admit pt to inpt rehab today. RN CM, Kristi, made aware.  Danne Baxter, RN, MSN Rehab Admissions Coordinator [phone] 02/15/2019 2:00 PM

## 2019-02-15 NOTE — Progress Notes (Signed)
Russell Arn, MD  Physician  Physical Medicine and Rehabilitation  PMR Pre-admission  Signed  Date of Service:  02/15/2019 2:52 PM       Related encounter: ED to Hosp-Admission (Current) from 02/08/2019 in [facility] 4E CV SURGICAL PROGRESSIVE CARE      Signed         Show:Clear all _0 Manual_1 Template_2 Copied  Added by: _3 Cristina Gong, RN_4 Russell Arn, MD  _5 Hover for details PMR Admission Coordinator Pre-Admission Assessment  Patient: Russell Dillon is an 73 y.o., male MRN: [id number] DOB: [birth year] Height: _6  (177.8 cm) Weight: 92.5 kg  Insurance Information HMO:     PPO:      PCP:      IPA:     80/20:      OTHER: no HMO PRIMARY: Medicare a and b      Policy#: [id number]      Subscriber: pt Benefits:  Phone #: passport one online     Name: 02/15/2019 Eff. Date: 06/11/2009     Deduct: $1408      Out of Pocket Max: NONE      Life Max: NONE CIR: 100%      SNF: 20 FULL DAYS Outpatient: 80%     Co-Pay: 20% Home Health: 100%      Co-Pay: none DME: 80%     Co-Pay: 20% Providers: pt choice  SECONDARY: AARP supplement      Policy#: [id number]      Subscriber: pt  Medicaid Application Date:       Case Manager:  Disability Application Date:       Case Worker:   The Data Collection Information Summary for patients in Inpatient Rehabilitation Facilities with attached Privacy Act Russell Dillon Records was provided and verbally reviewed with: Family  Emergency Contact Information         Contact Information    Name Relation Home Work Mobile   Russell Dillon Spouse [phone]  [phone]      Current Medical History  Patient Admitting Diagnosis: left patella tendon rupture; debility  History of Present Illness: Russell Dillon feels a 73 year old right-handed male with history ofAVR, atrial fibrillation is chronic Coumadin, bipolar disorder maintained on lithium, mild cognitive impairment maintained on Russell Dillon.Per chart  review,patient lives with spouse. Independent up until 2 weeks ago with increasing knee pain. 2 level home. Wife with a rotator cuff tear and limited physically. Presented 3/21/2020with increasing knee pain over the past 10 days and recent fall.Patient with low-grade fever. Hemoglobin on admission of 4, INR 4.3, WBC 18,400, BNP 220, lactic acid 1.8, urinalysis negative.Cranial CT scannegativefor acute changes.CT abdomen pelvis showed no acute abnormality. Cholelithiasis without acute inflammation. Initially placed on broad-spectrum antibiotics.Coumadin was reversed with vitamin K. 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 MRI showed bilateral meniscus tear, left patella tendon tear with lateral distal femur and tibial plateau nondisplaced fractures. Patient underwent left knee patellar tendon repair, arthroscopy left knee with partial medial and lateral meniscus with wound VAC application 15/17/6160 and return to the OR for patellar tendon repair meniscectomies 02/13/2019. Weightbearing as tolerated left knee hinged brace locked in extension at all times. No knee range of motion. Initially maintained on antibiotic therapy for suspect sepsis later discontinued 02/12/2019 and monitored white blood cell count normalized 10.3.leukocytosis was felt to be induced by initial fracture.Chronic Coumadin has been resumed. Hemoglobin stabilized9.3.    Patient's [facility] has been reviewed by the rehabilitation admission coordinator and physician.  Past Medical History  Past Medical History:  Diagnosis Date   Aortic valve regurgitation 10/16/2018   Echo 07/28/2017:  EF 60-65, moderate AI, aortic root and ascending aorta mildly dilated (40 mm), ascending aorta 43 mm, MAC, mild MR, mild LAE, moderate RV enlargement, trivial TR // Echo 12/19: EF 60-65, normal wall motion, mild AI, moderate BAE, ascending aorta 43 mm, aortic root 39 mm   BPH (benign prostatic  hypertrophy)    Chronic anticoagulation    Chronic atrial fibrillation    Diastolic dysfunction October 2010   Normal LV systolic function   Hx SBO    Hyperlipidemia    Hypertension    Patellar tendon rupture, left, initial encounter 02/10/2019   Small bowel obstruction ([place])     Family History   family history includes Diabetes in his father; Heart attack in his mother; Heart disease in his father; Hypertension in his mother.  Prior Rehab/Hospitalizations Has the patient had prior rehab or hospitalizations prior to admission? Yes  Has the patient had major surgery during 100 days prior to admission? yes  Current Medications  Current Facility-Administered Medications:    acetaminophen (TYLENOL) tablet 500 mg, 500 mg, Oral, Q8H, Ainsley Spinner, PA-C, 500 mg at 02/15/19 1044   acetaminophen (TYLENOL) tablet 650 mg, 650 mg, Oral, Q6H PRN, Ainsley Spinner, PA-C, 650 mg at 02/10/19 2144   diltiazem (CARDIZEM CD) 24 hr capsule 360 mg, 360 mg, Oral, Daily, Ainsley Spinner, PA-C, 360 mg at 02/15/19 1044   docusate sodium (COLACE) capsule 100 mg, 100 mg, Oral, BID, Ainsley Spinner, PA-C, 100 mg at 02/15/19 1044   donepezil (Russell Dillon) tablet 10 mg, 10 mg, Oral, Daily, Ainsley Spinner, PA-C, 10 mg at 02/15/19 1044   feeding supplement (ENSURE ENLIVE) (ENSURE ENLIVE) liquid 237 mL, 237 mL, Oral, TID BM, Ainsley Spinner, PA-C, 237 mL at 02/14/19 2040   finasteride (PROSCAR) tablet 5 mg, 5 mg, Oral, q morning - 10a, Ainsley Spinner, PA-C, 5 mg at 02/15/19 1044   HYDROcodone-acetaminophen (NORCO/VICODIN) 5-325 MG per tablet 1-2 tablet, 1-2 tablet, Oral, Q4H PRN, Ainsley Spinner, PA-C, 2 tablet at 02/12/19 1133   lactated ringers infusion, , Intravenous, Continuous, Ainsley Spinner, PA-C, Last Rate: 10 mL/hr at 02/11/19 0702   lithium carbonate capsule 150 mg, 150 mg, Oral, QHS, Ainsley Spinner, PA-C, 150 mg at 02/14/19 2113   metoCLOPramide (REGLAN) tablet 5-10 mg, 5-10 mg, Oral, Q8H PRN **OR**  metoCLOPramide (REGLAN) injection 5-10 mg, 5-10 mg, Intravenous, Q8H PRN, Ainsley Spinner, PA-C   morphine 2 MG/ML injection 0.5 mg, 0.5 mg, Intravenous, Q2H PRN, Ainsley Spinner, PA-C   multivitamin with minerals tablet 1 tablet, 1 tablet, Oral, Daily, Ainsley Spinner, PA-C, 1 tablet at 02/15/19 1044   omega-3 acid ethyl esters (LOVAZA) capsule 1 g, 1 g, Oral, QHS, Ainsley Spinner, PA-C, 1 g at 02/14/19 2114   ondansetron (ZOFRAN) tablet 4 mg, 4 mg, Oral, Q6H PRN **OR** ondansetron (ZOFRAN) injection 4 mg, 4 mg, Intravenous, Q6H PRN, Ainsley Spinner, PA-C   Warfarin - Pharmacist Dosing Inpatient, , Does not apply, q1800, Karren Cobble, RPH, Stopped at 02/13/19 1800  Patients Current Diet:     Diet Order                  Diet regular Room service appropriate? Yes with Assist; Fluid consistency: Thin  Diet effective now               Precautions / Restrictions Precautions Precautions: Fall Precaution Comments: maternity pads used to prevent [name] from rubbing Other Brace:  Left knee hinged brace locked in extension at all times. NO KNEE ROM Restrictions Weight Bearing Restrictions: Yes LLE Weight Bearing: Weight bearing as tolerated Other Position/Activity Restrictions: Immobilizer in place on top of hinged brace as hinged brace would not lock into extension.     Has the patient had 2 or more falls or a fall with injury in the past year? No  Prior Activity Level Limited Community (1-2x/wk): gradula decline in funciton over past year with his knee. only needed asisstance 4 to 5 days pta; drove to church 3 1/2 weeks ago  Prior Functional Level Self Care: Did the patient need help bathing, dressing, using the toilet or eating? Independent until 4 weeks pta  Indoor Mobility: Did the patient need assistance with walking from room to room (with or without device)? Independent until 4 weeks pta  Stairs: Did the patient need assistance with internal or external stairs (with or  without device)? Independent until 4 weeks pta  Functional Cognition: Did the patient need help planning regular tasks such as shopping or remembering to take medications? Needed some assistance  Home Assistive Devices / Equipment Home Equipment: Bedside commode, Hospital bed, Wheelchair - manual  Prior Device Use: Indicate devices/aids used by the patient prior to current illness, exacerbation or injury? None of the above  Current Functional Level Cognition  Overall Cognitive Status: History of cognitive impairments - at baseline Orientation Level: Oriented to person, Oriented to situation, Oriented to place, Disoriented to time General Comments: impaired direction following, requires frequent repetition    Extremity Assessment (includes Sensation/Coordination)  Upper Extremity Assessment: Generalized weakness  Lower Extremity Assessment: Defer to PT evaluation LLE Deficits / Details: s/p patellar tendon repair. unable to move LLE    ADLs  Overall ADL's : Needs assistance/impaired Eating/Feeding: Set up, Sitting Grooming: Set up, Sitting Upper Body Bathing: Moderate assistance, Sitting Lower Body Bathing: Maximal assistance, Total assistance, Sitting/lateral leans, Sit to/from stand Upper Body Dressing : Moderate assistance, Sitting Lower Body Dressing: Total assistance, Bed level Lower Body Dressing Details (indicate cue type and reason): brace, socks Toilet Transfer: +2 for physical assistance, +2 for safety/equipment, Moderate assistance, Maximal assistance, Ambulation, RW(short distance to recliner) Toilet Transfer Details (indicate cue type and reason): Mod A +2 to stand and Max A +2 to safe descent Toileting- Clothing Manipulation and Hygiene: Maximal assistance, Sitting/lateral lean, Sit to/from stand Functional mobility during ADLs: Maximal assistance, +2 for physical assistance, +2 for safety/equipment, Rolling walker, Moderate assistance(Short distance to  recliner) General ADL Comments: Pt continues to present with decreased cognition, balance, and strength.    Mobility  Overal bed mobility: Needs Assistance Bed Mobility: Supine to Sit Supine to sit: Min assist Sit to supine: Max assist, +2 for physical assistance General bed mobility comments: HOB up, increased time, assist for scooting L hip to EOB    Transfers  Overall transfer level: Needs assistance Equipment used: Rolling walker (2 wheeled) Transfers: Sit to/from Stand Sit to Stand: +2 physical assistance, Mod assist, From elevated surface General transfer comment: physical assist to position R LE and for hand placement, assist to rise and steady    Ambulation / Gait / Stairs / Wheelchair Mobility  Ambulation/Gait Ambulation/Gait assistance: Mod assist, +2 physical assistance Gait Distance (Feet): (pivotal steps to chair) Assistive device: Rolling walker (2 wheeled) Gait Pattern/deviations: Step-to pattern, Trunk flexed, Narrow base of support, Shuffle, Decreased stride length General Gait Details: Pt required assistance to turn and back to recliner chair.  Pt had increased difficulty backing to  seated surface as he struggled with following cues for sequencing and keeping device close to his person.  Pt required assistance to back up device as he step baclward toward seated surface.      Posture / Balance Dynamic Sitting Balance Sitting balance - Comments: required assistance to flex R knee to improve weight shift forward and to maintain sitting edge of bed.  Balance Overall balance assessment: Needs assistance Sitting-balance support: Feet supported Sitting balance-Leahy Scale: Fair Sitting balance - Comments: required assistance to flex R knee to improve weight shift forward and to maintain sitting edge of bed.  Postural control: Posterior lean Standing balance support: Bilateral upper extremity supported Standing balance-Leahy Scale: Poor Standing balance comment:  verbal and tactile cues for upright posture, heavy reliance on B UEs on RW    Special needs/care consideration BiPAP/CPAP  N/a CPM  N/a Continuous Drip IV  N/a Dialysis n/a Life Vest  N/a Oxygen  N/a Special Bed  N/a Trach Size  N/a Wound Vac n/a Skin surgical incision with compression dressing Bowel mgmt: continent LBM 4/5 Bladder mgmt:  External catheter Diabetic mgmt:  N/a Behavioral consideration  Memory issues at baseline Chemo/radiation  N/a   Previous Home Environment  Living Arrangements: Spouse/significant other  Lives With: Spouse Available Help at Discharge: Family, Available 24 hours/day Type of Home: House Home Layout: Two level Home Access: Stairs to enter [employer] Shower/Tub: Multimedia programmer: Standard Bathroom Accessibility: Yes How Accessible: Accessible via walker Home Care Services: No Additional Comments: Pt wife has current rotator cuff tear  Discharge Living Setting Plans for Discharge Living Setting: Patient's home, Lives with (comment)(spouse) Type of Home at Discharge: House Discharge Home Layout: Two level, Able to live on main level with bedroom/bathroom Discharge Home Access: Stairs to enter Entrance Stairs-Number of Steps: 2 Discharge Bathroom Shower/Tub: Walk-in shower Discharge Bathroom Toilet: Standard Discharge Bathroom Accessibility: Yes How Accessible: Accessible via walker Does the patient have any problems obtaining your medications?: No  Social/Family/Support Systems Patient Roles: Spouse, Parent Contact Information: wife, [name] Anticipated Caregiver: wife and family Anticipated Caregiver's Contact Information: [phone] Ability/Limitations of Caregiver: wife is petite and has rotator cuff problems Caregiver Availability: 24/7 Discharge Plan Discussed with Primary Caregiver: Yes Is Caregiver In Agreement with Plan?: Yes Does Caregiver/Family have Issues with Lodging/Transportation while Pt is in Rehab?:  No  Goals/Additional Needs Patient/Family Goal for Rehab: supervision PT, supervision to min assist OT Expected length of stay: ELOS 7 to 10 days Special Service Needs: Wife reports patietn with early demenia; pateitn denies memory problems Pt/Family Agrees to Admission and willing to participate: Yes Program Orientation Provided & Reviewed with Pt/Caregiver Including Roles  & Responsibilities: Yes  Decrease burden of Care through IP rehab admission: n/a  Possible need for SNF placement upon discharge: not anticipated  Patient Condition: I have reviewed medical records from [facility] , spoken with CM, and patient and spouse. I met with patient at the bedside for inpatient rehabilitation assessment.  Patient will benefit from ongoing PT and OT, can actively participate in 3 hours of therapy a day 5 days of the week, and can make measurable gains during the admission.  Patient will also benefit from the coordinated team approach during an Inpatient Acute Rehabilitation admission.  The patient will receive intensive therapy as well as Rehabilitation physician, nursing, social worker, and care management interventions.  Due to bladder management, bowel management, safety, skin/wound care, pain management and patient education the patient requires 24 hour a day rehabilitation nursing.  The patient is currently mod assist with mobility and basic ADLs.  Discharge setting and therapy post discharge at home with home health is anticipated.  Patient has agreed to participate in the Acute Inpatient Rehabilitation Program and will admit today.  Preadmission Screen Completed By:  Cleatrice Burke, RN MSN 02/15/2019 2:52 PM ______________________________________________________________________   Discussed status with Dr. Posey Pronto  on  02/15/2019 at  1507 and received approval for admission today.  Admission Coordinator:  Cleatrice Burke, RN, time  0379 Date  02/15/2019    Assessment/Plan: Diagnosis: left patella tendon rupture; debility  1. Does the need for close, 24 hr/day Medical supervision in concert with the patient's rehab needs make it unreasonable for this patient to be served in a less intensive setting? Yes  2. Co-Morbidities requiring supervision/potential complications: AVR CHF (Monitor in accordance with increased physical activity and avoid UE resistance excercises), atrial fibrillation is chronic Coumadin, bipolar disorder maintained on lithium, mild cognitive impairment maintained on Russell Dillon 3. Due to safety, disease management, medication administration and patient education, does the patient require 24 hr/day rehab nursing? Yes 4. Does the patient require coordinated care of a physician, rehab nurse, PT (1-2 hrs/day, 5 days/week) and OT (1-2 hrs/day, 5 days/week) to address physical and functional deficits in the context of the above medical diagnosis(es)? Yes Addressing deficits in the following areas: balance, endurance, locomotion, strength, transferring, bathing, dressing, toileting and psychosocial support 5. Can the patient actively participate in an intensive therapy program of at least 3 hrs of therapy 5 days a week? Yes 6. The potential for patient to make measurable gains while on inpatient rehab is excellent 7. Anticipated functional outcomes upon discharge from inpatients are: supervision and min assist PT, supervision and min assist OT, n/a SLP 8. Estimated rehab length of stay to reach the above functional goals is: 14-18 days. 9. Anticipated D/C setting: Home 10. Anticipated post D/C treatments: HH therapy and Home excercise program 11. Overall Rehab/Functional Prognosis: good  MD Signature: Delice Lesch, MD, ABPMR        Revision History

## 2019-02-15 NOTE — Discharge Summary (Signed)
DISCHARGE SUMMARY  Russell Dillon  MR#: [id number]  DOB:[birth year]  Date of Admission: 02/08/2019 Date of Discharge: 02/15/2019  Attending Physician:Seara Hinesley Hennie Duos, MD  Patient's GYI:[name], Russell Saxon, MD  Consults:  Disposition: D/C to CIR   Follow-up Appts: To be determined at time pt is D/C from CIR   Discharge Diagnoses: SIRS Anemia of chronic disease  Coagulopathy secondary Coumadin Near complete tear/avulsion of L patellar tendon off insertion on tibial tuberosity & B meniscal tears  A. fib with RVR  Bipolar D/O Dementia   Initial presentation: 73yo w/ a hx of cognitive impairment, atrial fibrillation, and bipolar disorder who was found to be increasingly weak with increasing pain in his left knee which progressed to the point he was not able to walk. He has had a chronic left knee problem reportedly. He visited his PCP's office where a CBC noted a 4 g drop in hemoglobin. He was advised to come to the ER.   In the ER the patient was mildly confused and had a temperature of 101 F w/ leukocytosis. On exam the patient's left knee was tender to touch. Chest x-ray was unremarkable. UA did not show any signs of infection. MRI of the left knee was done which suggested osteomyelitis and septic arthritis.   Hospital Course:  SIRS - no Sepsis  No evidence of septic arthritis per arthrocentesis - no clear localizing sx during hospital stay, and no evidence of other infectious sources - no recurrent fever after admission, and WBC normalized  - suspect elevated WBC and low grade temp elevation at admit was part of inflammatory response related to significant traumatic damage to L knee   Anemia ?4g drop in Hgb per family (unclear over what time frame) - no clinical evidence to support acute blood loss / active bleeding - stool for occult blood ordered but never completed - Hgb stable at time of d/c   Coagulopathy secondary Coumadin Coumadin held for surgery and pt was  dosed w/ low dose vitamin K x1 due to supratherapeutic INR - IV heparin utilized as bridge - coumadin resumed post-op   Near complete tear/avulsion of L patellar tendon off insertion on tibial tuberosity & B meniscal tears  S/p surgical repair 4/3 - post-op care provided by Orthopedics - cleared for CIR   A. fib with RVR  Remains on warfarin at time of d/c - rate controlled - no complications during this admit    Bipolar  Lithium level normal  Dementia Cont usual home medical tx  Meds at time of Discharge:  Current Facility-Administered Medications:  .  acetaminophen (TYLENOL) tablet 500 mg, 500 mg, Oral, Q8H, Ainsley Spinner, PA-C, 500 mg at 02/15/19 1044 .  acetaminophen (TYLENOL) tablet 650 mg, 650 mg, Oral, Q6H PRN, Ainsley Spinner, PA-C, 650 mg at 02/10/19 2144 .  diltiazem (CARDIZEM CD) 24 hr capsule 360 mg, 360 mg, Oral, Daily, Ainsley Spinner, PA-C, 360 mg at 02/15/19 1044 .  docusate sodium (COLACE) capsule 100 mg, 100 mg, Oral, BID, Ainsley Spinner, PA-C, 100 mg at 02/15/19 1044 .  donepezil (ARICEPT) tablet 10 mg, 10 mg, Oral, Daily, Ainsley Spinner, PA-C, 10 mg at 02/15/19 1044 .  feeding supplement (ENSURE ENLIVE) (ENSURE ENLIVE) liquid 237 mL, 237 mL, Oral, TID BM, Ainsley Spinner, PA-C, 237 mL at 02/14/19 2040 .  finasteride (PROSCAR) tablet 5 mg, 5 mg, Oral, q morning - 10a, Ainsley Spinner, PA-C, 5 mg at 02/15/19 1044 .  HYDROcodone-acetaminophen (NORCO/VICODIN) 5-325 MG per tablet 1-2 tablet, 1-2 tablet, Oral,  Q4H PRN, Ainsley Spinner, PA-C, 2 tablet at 02/12/19 1133 .  lactated ringers infusion, , Intravenous, Continuous, Ainsley Spinner, PA-C, Last Rate: 10 mL/hr at 02/11/19 3790 .  lithium carbonate capsule 150 mg, 150 mg, Oral, QHS, Ainsley Spinner, PA-C, 150 mg at 02/14/19 2113 .  metoCLOPramide (REGLAN) tablet 5-10 mg, 5-10 mg, Oral, Q8H PRN **OR** metoCLOPramide (REGLAN) injection 5-10 mg, 5-10 mg, Intravenous, Q8H PRN, Ainsley Spinner, PA-C .  morphine 2 MG/ML injection 0.5 mg, 0.5 mg, Intravenous, Q2H  PRN, Ainsley Spinner, PA-C .  multivitamin with minerals tablet 1 tablet, 1 tablet, Oral, Daily, Ainsley Spinner, PA-C, 1 tablet at 02/15/19 1044 .  omega-3 acid ethyl esters (LOVAZA) capsule 1 g, 1 g, Oral, QHS, Ainsley Spinner, PA-C, 1 g at 02/14/19 2114 .  ondansetron (ZOFRAN) tablet 4 mg, 4 mg, Oral, Q6H PRN **OR** ondansetron (ZOFRAN) injection 4 mg, 4 mg, Intravenous, Q6H PRN, Ainsley Spinner, PA-C .  Warfarin - Pharmacist Dosing Inpatient, , Does not apply, q1800, Karren Cobble, RPH, Stopped at 02/13/19 1800   Day of Discharge BP (!) 108/57   Pulse 70   Temp 97.7 F (36.5 C) (Oral)   Resp 15   Ht 5\' 10"  (1.778 m)   Wt 92.5 kg   SpO2 100%   BMI 29.26 kg/m   Physical Exam: General: No acute respiratory distress Cardiovascular: Regular rate without murmur  Abdomen: Nondistended, soft Extremities: No significant edema bilateral lower extremities  Basic Metabolic Panel: Recent Labs  Lab 02/09/19 0524 02/10/19 0302 02/12/19 0210 02/13/19 0231 02/14/19 0454 02/15/19 0240  NA 136 137 138 136 135  --   K 3.3* 3.6 3.9 3.7 3.7  --   CL 106 106 106 106 103  --   CO2 21* 23 25 24 24   --   GLUCOSE 114* 112* 168* 140* 116*  --   BUN 21 19 18  30* 29*  --   CREATININE 0.87 0.96 0.85 0.92 0.91  --   CALCIUM 8.4* 8.3*  8.2* 8.4* 8.3* 8.1*  --   MG  --  1.8  --   --  2.0 2.0    Liver Function Tests: Recent Labs  Lab 02/09/19 0524 02/10/19 0302  AST 17 19  ALT 18 19  ALKPHOS 77 74  BILITOT 1.3* 1.4*  PROT 6.4* 6.1*  ALBUMIN 2.1* 1.9*    Coags: Recent Labs  Lab 02/11/19 0304 02/12/19 0210 02/13/19 0231 02/14/19 0454 02/15/19 0240  INR 1.7* 2.3* 3.5* 3.4* 3.0*    CBC: Recent Labs  Lab 02/11/19 0304 02/12/19 0210 02/13/19 0231 02/14/19 0454 02/15/19 0240  WBC 10.5 14.1* 18.3* 10.0 10.3  HGB 8.9* 9.6* 9.3* 9.0* 9.3*  HCT 26.6* 29.2* 27.9* 28.6* 28.6*  MCV 85.3 85.1 85.3 85.6 86.1  PLT 300 362 421* 425* 465*    Recent Results (from the past 240 hour(s))   Blood culture (routine x 2)     Status: None   Collection Time: 02/08/19  2:00 PM  Result Value Ref Range Status   Specimen Description BLOOD LEFT FOREARM  Final   Special Requests   Final    BOTTLES DRAWN AEROBIC AND ANAEROBIC Blood Culture adequate volume   Culture   Final    NO GROWTH 5 DAYS Performed at [facility], [address] [place]  [address]    Report Status 02/13/2019 FINAL  Final  Blood culture (routine x 2)     Status: None   Collection Time: 02/08/19  4:00 PM  Result Value Ref Range Status   Specimen Description BLOOD RIGHT FOREARM  Final   Special Requests   Final    BOTTLES DRAWN AEROBIC AND ANAEROBIC Blood Culture adequate volume   Culture   Final    NO GROWTH 5 DAYS Performed at [facility], [address], [place], [place] [address]    Report Status 02/13/2019 FINAL  Final  Urine culture     Status: None   Collection Time: 02/08/19  6:27 PM  Result Value Ref Range Status   Specimen Description URINE, RANDOM  Final   Special Requests unknown Normal  Final   Culture   Final    NO GROWTH Performed at [facility], [place] [address], [place], [place] [address]    Report Status 02/09/2019 FINAL  Final  Body fluid culture     Status: None   Collection Time: 02/09/19  1:43 AM  Result Value Ref Range Status   Specimen Description FLUID SYNOVIAL LEFT KNEE  Final   Special Requests SYRINGE  Final   Gram Stain   Final    ABUNDANT WBC PRESENT, PREDOMINANTLY PMN NO ORGANISMS SEEN    Culture   Final    NO GROWTH 3 DAYS Performed at [facility], [place] [address], [place], [place] [address]    Report Status 02/12/2019 FINAL  Final  MRSA PCR Screening     Status: None   Collection Time: 02/10/19  2:31 PM  Result Value Ref Range Status   MRSA by PCR NEGATIVE NEGATIVE Final    Comment:        The GeneXpert MRSA Assay (FDA approved for NASAL specimens only), is one component of a comprehensive MRSA colonization surveillance  program. It is not intended to diagnose MRSA infection nor to guide or monitor treatment for MRSA infections. Performed at [facility], [place] [address], [place], [place] [address]       Time spent in discharge (includes decision making & examination of pt): 35 minutes  02/15/2019, 2:27 PM   Cherene Altes, MD [facility] Office  [phone]

## 2019-02-15 NOTE — H&P (Signed)
Physical Medicine and Rehabilitation Admission H&P    Chief Complaint  Patient presents with   Leg Pain   Altered Mental Status   Fever  Chief complaint: knee pain  HPI: Russell Dillon feels a 73 year old right-handed male with history of AVR, atrial fibrillation is chronic Coumadin, bipolar disorder maintained on lithium, mild cognitive impairment maintained on Aricept.  Per chart review, patient lives with spouse. Independent up until 2 weeks ago with increasing knee pain. 2 level home. Wife with a rotator cuff tear and limited physically. Presented 01/30/2019 with increasing knee pain over the past 10 days and recent fall.Patient with low-grade fever. Hemoglobin on admission of 4, INR 4.3, WBC 18,400, BNP 220, lactic acid 1.8, urinalysis negative. Cranial CT scan negative for acute changes.CT abdomen pelvis showed no acute abnormality. Cholelithiasis without acute inflammation. Initially placed on broad-spectrum antibiotics.Coumadin was reversed with vitamin K. 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 MRI showed bilateral meniscus tear, left patella tendon tear with lateral distal femur and tibial plateau nondisplaced fractures. Patient underwent left knee patellar tendon repair, arthroscopy left knee with partial medial and lateral menisc with wound VAC application 03/54/6568 and return to the OR for patellar tendon repair meniscectomies 02/13/2019. Weightbearing as tolerated left knee hinged brace locked in extension at all times. No knee range of motion. Initially maintained on antibiotic therapy for suspect sepsis later discontinued 02/12/2019 and monitored white blood cell count normalized 10.3. leukocytosis was felt to be induced by initial fracture. Chronic Coumadin has been resumed. Hemoglobin stabilized 9.3. Therapy evaluations completed with recommendations of physical medicine rehabilitation consult. Patient was admitted for a comprehensive rehabilitation  program.  Review of Systems  HENT: Negative for hearing loss.   Eyes: Negative for blurred vision and double vision.  Respiratory: Negative for cough and shortness of breath.   Gastrointestinal: Negative for heartburn, nausea and vomiting.  Genitourinary: Negative for dysuria, flank pain and hematuria.  Musculoskeletal: Positive for joint pain and myalgias.       Recent fall 2 weeks ago  Skin: Negative for rash.  Neurological: Positive for focal weakness.  Psychiatric/Behavioral: Positive for memory loss. The patient has insomnia.        Bipolar disorder  All other systems reviewed and are negative.  Past Medical History:  Diagnosis Date   Aortic valve regurgitation 10/16/2018   Echo 07/28/2017:  EF 60-65, moderate AI, aortic root and ascending aorta mildly dilated (40 mm), ascending aorta 43 mm, MAC, mild MR, mild LAE, moderate RV enlargement, trivial TR // Echo 12/19: EF 60-65, normal wall motion, mild AI, moderate BAE, ascending aorta 43 mm, aortic root 39 mm   BPH (benign prostatic hypertrophy)    Chronic anticoagulation    Chronic atrial fibrillation    Diastolic dysfunction October 2010   Normal LV systolic function   Hx SBO    Hyperlipidemia    Hypertension    Patellar tendon rupture, left, initial encounter 02/10/2019   Small bowel obstruction [facility])    Past Surgical History:  Procedure Laterality Date   APPENDECTOMY  1275   APPLICATION OF WOUND VAC Left 02/11/2019   Procedure: Application Of Wound Vac;  Surgeon: Altamese Church Hill, MD;  Location: [facility];  Service: Orthopedics;  Laterality: Left;   CARDIOVASCULAR STRESS TEST  09/30/2007   EF 67%  No ischemia.    CARDIOVERSION  05/01/2004   KNEE ARTHROSCOPY Left 02/11/2019   Procedure: ARTHROSCOPY LEFT KNEE;  Surgeon: Altamese Primghar, MD;  Location: [facility];  Service:  Orthopedics;  Laterality: Left;   PATELLAR TENDON REPAIR Left 02/11/2019   Procedure: LEFT PATELLA TENDON REPAIR;  Surgeon: Altamese Park Layne, MD;  Location:  [facility];  Service: Orthopedics;  Laterality: Left;   SMALL INTESTINE SURGERY  2004   US ECHOCARDIOGRAPHY  09/05/2009   ef 55-60%   Family History  Problem Relation Age of Onset   Heart attack Mother    Hypertension Mother    Diabetes Father    Heart disease Father    Colon cancer Neg Hx    Esophageal cancer Neg Hx    Pancreatic cancer Neg Hx    Prostate cancer Neg Hx    Rectal cancer Neg Hx    Stomach cancer Neg Hx    Social History:  reports that he has never smoked. He has never used smokeless tobacco. He reports that he does not drink alcohol or use drugs. Allergies: No Known Allergies Medications Prior to Admission  Medication Sig Dispense Refill   Acetylcysteine (NAC 600 PO) Take 1 tablet by mouth at bedtime.     Cholecalciferol (VITAMIN D3 PO) Take 1 tablet by mouth 2 (two) times daily.     diltiazem (CARDIZEM CD) 360 MG 24 hr capsule Take 1 capsule (360 mg total) by mouth daily. 90 capsule 3   donepezil (ARICEPT) 10 MG tablet Take 1 tablet (10 mg total) by mouth daily. 90 tablet 3   finasteride (PROSCAR) 5 MG tablet Take 5 mg by mouth every morning.     lithium carbonate 150 MG capsule Take 1 capsule (150 mg total) by mouth at bedtime. 90 capsule 0   Multiple Vitamins-Minerals (ICAPS AREDS 2 PO) Take 1 tablet by mouth daily.     Omega-3 Fatty Acids (OMEGA-3 FISH OIL PO) Take 1 tablet by mouth at bedtime.     warfarin (COUMADIN) 5 MG tablet TAKE AS DIRECTED BY COUMADIN CLINIC (Patient taking differently: Take 2.5-5 mg by mouth See admin instructions. Take 1 tablet by mouth on Monday- Saturday  Take 1/2 tablet by mouth on Sunday) 90 tablet 1   lactose free nutrition (BOOST PLUS) LIQD Take 237 mLs by mouth 3 (three) times daily between meals.  0    Drug Regimen Review Drug regimen was reviewed and remains appropriate with no significant issues identified  Home: Home Living Family/patient expects to be discharged to:: Private residence Living  Arrangements: Spouse/significant other Available Help at Discharge: Family, Available PRN/intermittently Type of Home: House Home Access: Stairs to enter Home Layout: Two level Bathroom Shower/Tub: Insurance claims handler: Bedside commode, Hospital bed, Wheelchair - manual Additional Comments: Pt wife has current rotator cuff tear   Functional History: Prior Function Level of Independence: Needs assistance Gait / Transfers Assistance Needed: wife has been assisting with transfers since onset of left knee pain ADL's / Homemaking Assistance Needed: wife assists with ADL's  Functional Status:  Mobility: Bed Mobility Overal bed mobility: Needs Assistance Bed Mobility: Supine to Sit Supine to sit: Min assist Sit to supine: Max assist, +2 for physical assistance General bed mobility comments: HOB up, increased time, assist for scooting L hip to EOB Transfers Overall transfer level: Needs assistance Equipment used: Rolling walker (2 wheeled) Transfers: Sit to/from Stand Sit to Stand: +2 physical assistance, Mod assist, From elevated surface General transfer comment: physical assist to position R LE and for hand placement, assist to rise and steady Ambulation/Gait Ambulation/Gait assistance: Mod assist, +2 physical assistance Gait Distance (Feet): (pivotal steps bed to chair) Assistive device: Rolling walker (2 wheeled)  Gait Pattern/deviations: Step-to pattern, Trunk flexed, Narrow base of support, Shuffle, Decreased stride length General Gait Details: Pt required assistance to turn and back to recliner chair.  Pt had increased difficulty backing to seated surface as he struggled with following cues for sequencing and keeping device close to his person.  Pt required assistance to back up device as he step baclward toward seated surface.      ADL: ADL Overall ADL's : Needs assistance/impaired Eating/Feeding: Set up, Sitting Grooming: Set up, Sitting Upper Body Bathing: Moderate  assistance, Sitting Lower Body Bathing: Maximal assistance, Total assistance, Sitting/lateral leans, Sit to/from stand Upper Body Dressing : Moderate assistance, Sitting Lower Body Dressing: Total assistance, Bed level Lower Body Dressing Details (indicate cue type and reason): brace, socks Toilet Transfer: +2 for physical assistance, +2 for safety/equipment, Moderate assistance, Maximal assistance, Ambulation, RW(short distance to recliner) Toilet Transfer Details (indicate cue type and reason): Mod A +2 to stand and Max A +2 to safe descent Toileting- Clothing Manipulation and Hygiene: Maximal assistance, Sitting/lateral lean, Sit to/from stand Functional mobility during ADLs: Maximal assistance, +2 for physical assistance, +2 for safety/equipment, Rolling walker, Moderate assistance(Short distance to recliner) General ADL Comments: Pt continues to present with decreased cognition, balance, and strength.  Cognition: Cognition Overall Cognitive Status: History of cognitive impairments - at baseline Orientation Level: Oriented to person, Oriented to situation, Oriented to place, Disoriented to time Cognition Arousal/Alertness: Awake/alert Behavior During Therapy: Anxious Overall Cognitive Status: History of cognitive impairments - at baseline General Comments: impaired direction following, requires frequent repetition  Physical Exam: Blood pressure (!) 108/57, pulse 70, temperature 98.3 F (36.8 C), temperature source Oral, resp. rate 15, height _0  (1.778 m), weight 92.5 kg, SpO2 94 %. Physical Exam  Vitals reviewed. Constitutional: He appears well-developed and well-nourished.  HENT:  Head: Normocephalic and atraumatic.  Eyes: EOM are normal. Right eye exhibits no discharge. Left eye exhibits no discharge.  Respiratory: Effort normal.  GI: He exhibits no distension.  Musculoskeletal:     Comments: Left lower extremity with edema and tenderness  Neurological: He is alert.   Patient is pleasantly confused.  Oriented x1  Follow simple commands Motor: Right lower extremity: 4+/5 proximal distal Left lower extremity: Hip flexion 2+/5, knee locked in brace, ankle dorsiflexion 4/5  Skin:  Left knee with immobilizer in place.  Dressing C/D/I  Psychiatric: His speech is delayed. He is slowed. Cognition and memory are impaired.    Results for orders placed or performed during the hospital encounter of 02/08/19 (from the past 48 hour(s))  Vitamin B12     Status: None   Collection Time: 02/13/19  6:17 PM  Result Value Ref Range   Vitamin B-12 529 180 - 914 pg/mL    Comment: (NOTE) This assay is not validated for testing neonatal or myeloproliferative syndrome specimens for Vitamin B12 levels. Performed at [facility], [place] [address] [place], [place] [address]   Folate     Status: None   Collection Time: 02/13/19  6:17 PM  Result Value Ref Range   Folate 14.2 >5.9 ng/mL    Comment: Performed at [facility], [place] [address] [place], [place] [address]  Iron and TIBC     Status: Abnormal   Collection Time: 02/13/19  6:17 PM  Result Value Ref Range   Iron 34 (L) 45 - 182 ug/dL   TIBC 155 (L) 250 - 450 ug/dL   Saturation Ratios 22 17.9 - 39.5 %   UIBC  121 ug/dL    Comment: Performed at [facility], [place] [address], [place], [place] [address]  Ferritin     Status: Abnormal   Collection Time: 02/13/19  6:17 PM  Result Value Ref Range   Ferritin 504 (H) 24 - 336 ng/mL    Comment: Performed at [facility], [place] [address] [place], [place] [address]  Reticulocytes     Status: Abnormal   Collection Time: 02/13/19  6:17 PM  Result Value Ref Range   Retic Ct Pct 2.0 0.4 - 3.1 %   RBC. 3.75 (L) 4.22 - 5.81 MIL/uL   Retic Count, Absolute 73.9 19.0 - 186.0 K/uL   Immature Retic Fract 18.9 (H) 2.3 - 15.9 %    Comment: Performed at [facility] [address], [place], [place] [address]  CBC     Status: Abnormal   Collection Time:  02/14/19  4:54 AM  Result Value Ref Range   WBC 10.0 4.0 - 10.5 K/uL   RBC 3.34 (L) 4.22 - 5.81 MIL/uL   Hemoglobin 9.0 (L) 13.0 - 17.0 g/dL   HCT 28.6 (L) 39.0 - 52.0 %   MCV 85.6 80.0 - 100.0 fL   MCH 26.9 26.0 - 34.0 pg   MCHC 31.5 30.0 - 36.0 g/dL   RDW 14.9 11.5 - 15.5 %   Platelets 425 (H) 150 - 400 K/uL   nRBC 0.0 0.0 - 0.2 %    Comment: Performed at [facility], [place] [address] [place], [place] [address]  Basic metabolic panel     Status: Abnormal   Collection Time: 02/14/19  4:54 AM  Result Value Ref Range   Sodium 135 135 - 145 mmol/L   Potassium 3.7 3.5 - 5.1 mmol/L   Chloride 103 98 - 111 mmol/L   CO2 24 22 - 32 mmol/L   Glucose, Bld 116 (H) 70 - 99 mg/dL   BUN 29 (H) 8 - 23 mg/dL   Creatinine, Ser 0.91 0.61 - 1.24 mg/dL   Calcium 8.1 (L) 8.9 - 10.3 mg/dL   GFR calc non Af Amer >60 >60 mL/min   GFR calc Af Amer >60 >60 mL/min   Anion gap 8 5 - 15    Comment: Performed at [facility], [place] [address], [place], [place] [address]  Protime-INR     Status: Abnormal   Collection Time: 02/14/19  4:54 AM  Result Value Ref Range   Prothrombin Time 33.9 (H) 11.4 - 15.2 seconds   INR 3.4 (H) 0.8 - 1.2    Comment: (NOTE) INR goal varies based on device and disease states. Performed at [facility], [place] [address], [place], [place] [address]   Magnesium     Status: None   Collection Time: 02/14/19  4:54 AM  Result Value Ref Range   Magnesium 2.0 1.7 - 2.4 mg/dL    Comment: Performed at [facility] [address] [place], [place] [address]  CBC     Status: Abnormal   Collection Time: 02/15/19  2:40 AM  Result Value Ref Range   WBC 10.3 4.0 - 10.5 K/uL   RBC 3.32 (L) 4.22 - 5.81 MIL/uL   Hemoglobin 9.3 (L) 13.0 - 17.0 g/dL   HCT 28.6 (L) 39.0 - 52.0 %   MCV 86.1 80.0 - 100.0 fL   MCH 28.0 26.0 - 34.0 pg   MCHC 32.5 30.0 - 36.0 g/dL   RDW 15.3  11.5 - 15.5 %   Platelets 465 (H) 150 - 400 K/uL   nRBC 0.0 0.0 - 0.2 %    Comment: Performed  at [facility], [place] [address] [place], [place] [address]  Protime-INR     Status: Abnormal   Collection Time: 02/15/19  2:40 AM  Result Value Ref Range   Prothrombin Time 30.8 (H) 11.4 - 15.2 seconds   INR 3.0 (H) 0.8 - 1.2    Comment: (NOTE) INR goal varies based on device and disease states. Performed at [facility], [place] [address], [place], [place] [address]   Magnesium     Status: None   Collection Time: 02/15/19  2:40 AM  Result Value Ref Range   Magnesium 2.0 1.7 - 2.4 mg/dL    Comment: Performed at [facility] [address] [place], [place] [address]   No results found.     Medical Problem List and Plan: 1.  Decreased functional mobility secondary to acute on chronic left knee pain status post repair of left patellar tendon rupture and scope debridement of meniscal tears 02-2019 and 02/13/2019. Weightbearing as tolerated with full knee extension brace must be on an full extension no knee range of motion.  Admit to CIR 2.  Antithrombotics: -DVT/anticoagulation:  Chronic Coumadin  -antiplatelet therapy: N/A 3. Pain Management: hydrocodone as needed 4. Mood: Aricept 10 mg daily  -antipsychotic agents: lithium 150 mg daily 5. Neuropsych: This patient is not capable of making decisions on his own behalf. 6. Skin/Wound Care:  Routine skin checks 7. Fluids/Electrolytes/Nutrition:  Routine ins and outs with follow-up chemistries in the a.m. 8. Acute on chronic anemia. Follow-up CBC in the a.m. 9. Atrial fibrillation. Cardizem 360 mg daily. Cardiac rate controlled.  Monitor with increased mobility 10. BPH. Pro scar 11. Hyperlipidemia. [place]  The patient's status has not changed. The original post admission physician evaluation remains appropriate, and any changes from the pre-admission screening or documentation from the acute chart are noted above.   Delice Lesch, MD, ABPMR Lavon Paganini Angiulli, PA-C 02/15/2019

## 2019-02-15 NOTE — Progress Notes (Addendum)
Orthopedic Trauma Service Progress Note  Patient ID: Russell Dillon MRN: [id number] DOB/AGE: 75-Feb-1947 75 y.o.  Subjective:  Doing well No acute issues over weekend  Condom cath in place States he has a fair appetite   Therapy notes reviewed and he appears to be making expected progress    Antibiotics were stopped post op on 02/12/2019  ROS As above   Objective:   VITALS:   Vitals:   02/14/19 1949 02/14/19 2358 02/15/19 0445 02/15/19 0446  BP:  97/64 (!) 103/58   Pulse:  70    Resp:  18 15   Temp: 98.3 F (36.8 C) 98.4 F (36.9 C) 98.3 F (36.8 C)   TempSrc: Oral Oral Oral   SpO2: 95% 94% 94%   Weight:    92.5 kg  Height:        Estimated body mass index is 29.26 kg/m as calculated from the following:   Height as of this encounter: 5\' 10"  (1.778 m).   Weight as of this encounter: 92.5 kg.   Intake/Output      04/05 0701 - 04/06 0700 04/06 0701 - 04/07 0700   P.O. 360    Total Intake(mL/kg) 360 (3.9)    Urine (mL/kg/hr) 1550 (0.7) 350 (1.4)   Drains     Total Output 1550 350   Net -1190 -350          LABS  Results for orders placed or performed during the hospital encounter of 02/08/19 (from the past 24 hour(s))  CBC     Status: Abnormal   Collection Time: 02/15/19  2:40 AM  Result Value Ref Range   WBC 10.3 4.0 - 10.5 K/uL   RBC 3.32 (L) 4.22 - 5.81 MIL/uL   Hemoglobin 9.3 (L) 13.0 - 17.0 g/dL   HCT 28.6 (L) 39.0 - 52.0 %   MCV 86.1 80.0 - 100.0 fL   MCH 28.0 26.0 - 34.0 pg   MCHC 32.5 30.0 - 36.0 g/dL   RDW 15.3 11.5 - 15.5 %   Platelets 465 (H) 150 - 400 K/uL   nRBC 0.0 0.0 - 0.2 %  Protime-INR     Status: Abnormal   Collection Time: 02/15/19  2:40 AM  Result Value Ref Range   Prothrombin Time 30.8 (H) 11.4 - 15.2 seconds   INR 3.0 (H) 0.8 - 1.2  Magnesium     Status: None   Collection Time: 02/15/19  2:40 AM  Result Value Ref Range   Magnesium 2.0 1.7 - 2.4  mg/dL    L knee synovial fluid cultures are negative   PHYSICAL EXAM:   Gen: resting comfortably in bed, NAD Lungs: breathing unlabored Cardiac: irreg irreg  Abd: NT  Ext:       Left Lower Extremity   Hinged brace is on and is locked in full extension   Dressing and prevena removed  Incision looks outstanding   No erythema   No drainage   Swelling very well controlled to left leg  Distal motor and sensory functions intact  No DCT   + DP pulse  Ext warm   Pt trying to flex knee and move leg throughout dressing change despite being told to keep knee straight     Assessment/Plan: 4 Days Post-Op   Active Problems:   Atrial fibrillation,  chronic   HTN (hypertension)   SIRS (systemic inflammatory response syndrome) (HCC)   Osteomyelitis (HCC)   Acute blood loss anemia   Patellar tendon rupture, left, initial encounter   Bipolar 1 disorder ([place])   Dementia without behavioral disturbance ([place])   Anti-infectives (From admission, onward)   Start     Dose/Rate Route Frequency Ordered Stop   02/09/19 1000  ceFEPIme (MAXIPIME) 1 g in sodium chloride 0.9 % 100 mL IVPB  Status:  Discontinued     1 g 200 mL/hr over 30 Minutes Intravenous Every 8 hours 02/09/19 0239 02/11/19 1449   02/09/19 0600  vancomycin (VANCOCIN) IVPB 750 mg/150 ml premix  Status:  Discontinued     750 mg 150 mL/hr over 60 Minutes Intravenous Every 12 hours 02/09/19 0239 02/11/19 1450   02/09/19 0300  metroNIDAZOLE (FLAGYL) IVPB 500 mg  Status:  Discontinued     500 mg 100 mL/hr over 60 Minutes Intravenous Every 8 hours 02/09/19 0230 02/11/19 1435   02/09/19 0245  ceFEPIme (MAXIPIME) 2 g in sodium chloride 0.9 % 100 mL IVPB     2 g 200 mL/hr over 30 Minutes Intravenous  Once 02/09/19 0230 02/09/19 0357   02/09/19 0245  vancomycin (VANCOCIN) IVPB 1000 mg/200 mL premix  Status:  Discontinued     1,000 mg 200 mL/hr over 60 Minutes Intravenous  Once 02/09/19 0230 02/09/19 0230   02/08/19 1615  vancomycin  (VANCOCIN) IVPB 1000 mg/200 mL premix     1,000 mg 200 mL/hr over 60 Minutes Intravenous  Once 02/08/19 1612 02/08/19 1914   02/08/19 1615  piperacillin-tazobactam (ZOSYN) IVPB 3.375 g     3.375 g 100 mL/hr over 30 Minutes Intravenous  Once 02/08/19 1612 02/08/19 1856    .  POD/HD#: 66   73 year old white male with impaired cognitive function, acute on chronic left knee pain   -Acute on chronic left knee pain s/p repair of L patella tendon rupture and scope debridement of meniscal tears, gout              WBAT with knee full extension, brace must be on and in full extension. Brace is locked out. There is not need to unlock for any reason             NO KNEE ROM              Ice and elevate for swelling and pain control             daily dressing changes as needed from this point forward   Dry gauze and tape ok   Ok to clean wound with soap and water  TED hose              PT and OT                Float heel off bed to prevent pressure sore      - Pain management:             good control  Appears to have adequate control with scheduled tylenol   - ABL anemia/Hemodynamics             stable    - Medical issues              Per hospitalist service    - DVT/PE prophylaxis---> chronic anticoagulation PTA due to A-fib             coumadin  INR 3.0 today  - ID:  all cultures negative to date         Leukocytosis like related to actue inflammatory response due to surgery and fracture         abx dc'd on 02/12/2019     - Activity:             Up with assistance             WBAT L leg with brace on and locked in full extension    - FEN/GI prophylaxis/Foley/Lines:             regular diet    - Impediments to fracture healing:             Impaired cognitive function   - Dispo:             therapies              Arrange HH needs             Wife adamant about pt not going to SNF              will place CIR consult    Jari Pigg, PA-C [phone] (C)  02/15/2019, 9:38 AM  [facility] [place] [phone] [name] (F)

## 2019-02-15 NOTE — Progress Notes (Signed)
Patient ID: Russell Dillon, male   DOB: 06-11-1946, 74 y.o.   MRN: [id number] Admit to unit, reviewed orders, medications, rehab schedule and plan of care. States an understanding of information reviewed. Evalyn Casco

## 2019-02-15 NOTE — PMR Pre-admission (Signed)
PMR Admission Coordinator Pre-Admission Assessment  Patient: Russell Dillon is an 73 y.o., male MRN: [id number] DOB: 01-14-1946 Height: _0  (177.8 cm) Weight: 92.5 kg  Insurance Information HMO:     PPO:      PCP:      IPA:     80/20:      OTHER: no HMO PRIMARY: Medicare a and b      Policy#: [id number]      Subscriber: pt Benefits:  Phone #: passport one online     Name: 02/15/2019 Eff. Date: 06/11/2009     Deduct: $1408      Out of Pocket Max: NONE      Life Max: NONE CIR: 100%      SNF: 20 FULL DAYS Outpatient: 80%     Co-Pay: 20% Home Health: 100%      Co-Pay: none DME: 80%     Co-Pay: 20% Providers: pt choice  SECONDARY: AARP supplement      Policy#: [id number]      Subscriber: pt  Medicaid Application Date:       Case Manager:  Disability Application Date:       Case Worker:   The "Data Collection Information Summary" for patients in Inpatient Rehabilitation Facilities with attached "Privacy Act [place] Records" was provided and verbally reviewed with: Family  Emergency Contact Information Contact Information    Name Relation Home Work Mobile   Kirkpatrick,[name] Spouse [phone]  [phone]      Current Medical History  Patient Admitting Diagnosis: left patella tendon rupture; debility  History of Present Illness:  Latravis Grine feels a 73 year old right-handed male with history of AVR, atrial fibrillation is chronic Coumadin, bipolar disorder maintained on lithium, mild cognitive impairment maintained on Aricept.  Per chart review, patient lives with spouse. Independent up until 2 weeks ago with increasing knee pain. 2 level home. Wife with a rotator cuff tear and limited physically. Presented 01/30/2019 with increasing knee pain over the past 10 days and recent fall.Patient with low-grade fever. Hemoglobin on admission of 4, INR 4.3, WBC 18,400, BNP 220, lactic acid 1.8, urinalysis negative. Cranial CT scan negative for acute changes.CT abdomen pelvis  showed no acute abnormality. Cholelithiasis without acute inflammation. Initially placed on broad-spectrum antibiotics.Coumadin was reversed with vitamin K. 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 MRI showed bilateral meniscus tear, left patella tendon tear with lateral distal femur and tibial plateau nondisplaced fractures. Patient underwent left knee patellar tendon repair, arthroscopy left knee with partial medial and lateral meniscus with wound VAC application 38/08/1750 and return to the OR for patellar tendon repair meniscectomies 02/13/2019. Weightbearing as tolerated left knee hinged brace locked in extension at all times. No knee range of motion. Initially maintained on antibiotic therapy for suspect sepsis later discontinued 02/12/2019 and monitored white blood cell count normalized 10.3. leukocytosis was felt to be induced by initial fracture. Chronic Coumadin has been resumed. Hemoglobin stabilized 9.3.    Patient[facility] has been reviewed by the rehabilitation admission coordinator and physician.  Past Medical History  Past Medical History:  Diagnosis Date  . Aortic valve regurgitation 10/16/2018   Echo 07/28/2017:  EF 60-65, moderate AI, aortic root and ascending aorta mildly dilated (40 mm), ascending aorta 43 mm, MAC, mild MR, mild LAE, moderate RV enlargement, trivial TR // Echo 12/19: EF 60-65, normal wall motion, mild AI, moderate BAE, ascending aorta 43 mm, aortic root 39 mm  . BPH (benign prostatic hypertrophy)   .  Chronic anticoagulation   . Chronic atrial fibrillation   . Diastolic dysfunction October 2010   Normal LV systolic function  . Hx SBO   . Hyperlipidemia   . Hypertension   . Patellar tendon rupture, left, initial encounter 02/10/2019  . Small bowel obstruction (HCC)     Family History   family history includes Diabetes in his father; Heart attack in his mother; Heart disease in his father; Hypertension in his  mother.  Prior Rehab/Hospitalizations Has the patient had prior rehab or hospitalizations prior to admission? Yes  Has the patient had major surgery during 100 days prior to admission? yes  Current Medications  Current Facility-Administered Medications:  .  acetaminophen (TYLENOL) tablet 500 mg, 500 mg, Oral, Q8H, Ainsley Spinner, PA-C, 500 mg at 02/15/19 1044 .  acetaminophen (TYLENOL) tablet 650 mg, 650 mg, Oral, Q6H PRN, Ainsley Spinner, PA-C, 650 mg at 02/10/19 2144 .  diltiazem (CARDIZEM CD) 24 hr capsule 360 mg, 360 mg, Oral, Daily, Ainsley Spinner, PA-C, 360 mg at 02/15/19 1044 .  docusate sodium (COLACE) capsule 100 mg, 100 mg, Oral, BID, Ainsley Spinner, PA-C, 100 mg at 02/15/19 1044 .  donepezil (ARICEPT) tablet 10 mg, 10 mg, Oral, Daily, Ainsley Spinner, PA-C, 10 mg at 02/15/19 1044 .  feeding supplement (ENSURE ENLIVE) (ENSURE ENLIVE) liquid 237 mL, 237 mL, Oral, TID BM, Ainsley Spinner, PA-C, 237 mL at 02/14/19 2040 .  finasteride (PROSCAR) tablet 5 mg, 5 mg, Oral, q morning - 10a, Ainsley Spinner, PA-C, 5 mg at 02/15/19 1044 .  HYDROcodone-acetaminophen (NORCO/VICODIN) 5-325 MG per tablet 1-2 tablet, 1-2 tablet, Oral, Q4H PRN, Ainsley Spinner, PA-C, 2 tablet at 02/12/19 1133 .  lactated ringers infusion, , Intravenous, Continuous, Ainsley Spinner, PA-C, Last Rate: 10 mL/hr at 02/11/19 9983 .  lithium carbonate capsule 150 mg, 150 mg, Oral, QHS, Ainsley Spinner, PA-C, 150 mg at 02/14/19 2113 .  metoCLOPramide (REGLAN) tablet 5-10 mg, 5-10 mg, Oral, Q8H PRN **OR** metoCLOPramide (REGLAN) injection 5-10 mg, 5-10 mg, Intravenous, Q8H PRN, Ainsley Spinner, PA-C .  morphine 2 MG/ML injection 0.5 mg, 0.5 mg, Intravenous, Q2H PRN, Ainsley Spinner, PA-C .  multivitamin with minerals tablet 1 tablet, 1 tablet, Oral, Daily, Ainsley Spinner, PA-C, 1 tablet at 02/15/19 1044 .  omega-3 acid ethyl esters (LOVAZA) capsule 1 g, 1 g, Oral, QHS, Ainsley Spinner, PA-C, 1 g at 02/14/19 2114 .  ondansetron (ZOFRAN) tablet 4 mg, 4 mg, Oral, Q6H PRN **OR**  ondansetron (ZOFRAN) injection 4 mg, 4 mg, Intravenous, Q6H PRN, Ainsley Spinner, PA-C .  Warfarin - Pharmacist Dosing Inpatient, , Does not apply, q1800, Karren Cobble [facility], Stopped at 02/13/19 1800  Patients Current Diet:  Diet Order            Diet regular Room service appropriate? Yes with Assist; Fluid consistency: Thin  Diet effective now              Precautions / Restrictions Precautions Precautions: Fall Precaution Comments: maternity pads used to prevent [name] from rubbing Other Brace: Left knee hinged brace locked in extension at all times. NO KNEE ROM Restrictions Weight Bearing Restrictions: Yes LLE Weight Bearing: Weight bearing as tolerated Other Position/Activity Restrictions: Immobilizer in place on top of hinged brace as hinged brace would not lock into extension.     Has the patient had 2 or more falls or a fall with injury in the past year? No  Prior Activity Level Limited Community (1-2x/wk): gradula decline in funciton over past year with his knee.  only needed asisstance 4 to 5 days pta; drove to church 3 1/2 weeks ago  Prior Functional Level Self Care: Did the patient need help bathing, dressing, using the toilet or eating? Independent until 4 weeks pta  Indoor Mobility: Did the patient need assistance with walking from room to room (with or without device)? Independent until 4 weeks pta  Stairs: Did the patient need assistance with internal or external stairs (with or without device)? Independent until 4 weeks pta  Functional Cognition: Did the patient need help planning regular tasks such as shopping or remembering to take medications? Needed some assistance  Home Assistive Devices / Equipment Home Equipment: Bedside commode, Hospital bed, Wheelchair - manual  Prior Device Use: Indicate devices/aids used by the patient prior to current illness, exacerbation or injury? None of the above  Current Functional Level Cognition  Overall Cognitive  Status: History of cognitive impairments - at baseline Orientation Level: Oriented to person, Oriented to situation, Oriented to place, Disoriented to time General Comments: impaired direction following, requires frequent repetition    Extremity Assessment (includes Sensation/Coordination)  Upper Extremity Assessment: Generalized weakness  Lower Extremity Assessment: Defer to PT evaluation LLE Deficits / Details: s/p patellar tendon repair. unable to move LLE    ADLs  Overall ADL's : Needs assistance/impaired Eating/Feeding: Set up, Sitting Grooming: Set up, Sitting Upper Body Bathing: Moderate assistance, Sitting Lower Body Bathing: Maximal assistance, Total assistance, Sitting/lateral leans, Sit to/from stand Upper Body Dressing : Moderate assistance, Sitting Lower Body Dressing: Total assistance, Bed level Lower Body Dressing Details (indicate cue type and reason): brace, socks Toilet Transfer: +2 for physical assistance, +2 for safety/equipment, Moderate assistance, Maximal assistance, Ambulation, RW(short distance to recliner) Toilet Transfer Details (indicate cue type and reason): Mod A +2 to stand and Max A +2 to safe descent Toileting- Clothing Manipulation and Hygiene: Maximal assistance, Sitting/lateral lean, Sit to/from stand Functional mobility during ADLs: Maximal assistance, +2 for physical assistance, +2 for safety/equipment, Rolling walker, Moderate assistance(Short distance to recliner) General ADL Comments: Pt continues to present with decreased cognition, balance, and strength.    Mobility  Overal bed mobility: Needs Assistance Bed Mobility: Supine to Sit Supine to sit: Min assist Sit to supine: Max assist, +2 for physical assistance General bed mobility comments: HOB up, increased time, assist for scooting L hip to EOB    Transfers  Overall transfer level: Needs assistance Equipment used: Rolling walker (2 wheeled) Transfers: Sit to/from Stand Sit to Stand: +2  physical assistance, Mod assist, From elevated surface General transfer comment: physical assist to position R LE and for hand placement, assist to rise and steady    Ambulation / Gait / Stairs / Wheelchair Mobility  Ambulation/Gait Ambulation/Gait assistance: Mod assist, +2 physical assistance Gait Distance (Feet): (pivotal steps to chair) Assistive device: Rolling walker (2 wheeled) Gait Pattern/deviations: Step-to pattern, Trunk flexed, Narrow base of support, Shuffle, Decreased stride length General Gait Details: Pt required assistance to turn and back to recliner chair.  Pt had increased difficulty backing to seated surface as he struggled with following cues for sequencing and keeping device close to his person.  Pt required assistance to back up device as he step baclward toward seated surface.      Posture / Balance Dynamic Sitting Balance Sitting balance - Comments: required assistance to flex R knee to improve weight shift forward and to maintain sitting edge of bed.  Balance Overall balance assessment: Needs assistance Sitting-balance support: Feet supported Sitting balance-Leahy Scale: Fair Sitting balance -  Comments: required assistance to flex R knee to improve weight shift forward and to maintain sitting edge of bed.  Postural control: Posterior lean Standing balance support: Bilateral upper extremity supported Standing balance-Leahy Scale: Poor Standing balance comment: verbal and tactile cues for upright posture, heavy reliance on B UEs on RW    Special needs/care consideration BiPAP/CPAP  N/a CPM  N/a Continuous Drip IV  N/a Dialysis n/a Life Vest  N/a Oxygen  N/a Special Bed  N/a Trach Size  N/a Wound Vac n/a Skin surgical incision with compression dressing Bowel mgmt: continent LBM 4/5 Bladder mgmt:  External catheter Diabetic mgmt:  N/a Behavioral consideration  Memory issues at baseline Chemo/radiation  N/a   Previous Home Environment  Living  Arrangements: Spouse/significant other  Lives With: Spouse Available Help at Discharge: Family, Available 24 hours/day Type of Home: House Home Layout: Two level Home Access: Stairs to enter [employer] Shower/Tub: Multimedia programmer: Standard Bathroom Accessibility: Yes How Accessible: Accessible via walker Home Care Services: No Additional Comments: Pt wife has current rotator cuff tear  Discharge Living Setting Plans for Discharge Living Setting: Patient's home, Lives with (comment)(spouse) Type of Home at Discharge: House Discharge Home Layout: Two level, Able to live on main level with bedroom/bathroom Discharge Home Access: Stairs to enter Entrance Stairs-Number of Steps: 2 Discharge Bathroom Shower/Tub: Walk-in shower Discharge Bathroom Toilet: Standard Discharge Bathroom Accessibility: Yes How Accessible: Accessible via walker Does the patient have any problems obtaining your medications?: No  Social/Family/Support Systems Patient Roles: Spouse, Parent Contact Information: wife, [name] Anticipated Caregiver: wife and family Anticipated Caregiver's Contact Information: [phone] Ability/Limitations of Caregiver: wife is petite and has rotator cuff problems Caregiver Availability: 24/7 Discharge Plan Discussed with Primary Caregiver: Yes Is Caregiver In Agreement with Plan?: Yes Does Caregiver/Family have Issues with Lodging/Transportation while Pt is in Rehab?: No  Goals/Additional Needs Patient/Family Goal for Rehab: supervision PT, supervision to min assist OT Expected length of stay: ELOS 7 to 10 days Special Service Needs: Wife reports patietn with early demenia; pateitn denies memory problems Pt/Family Agrees to Admission and willing to participate: Yes Program Orientation Provided & Reviewed with Pt/Caregiver Including Roles  & Responsibilities: Yes  Decrease burden of Care through IP rehab admission: n/a  Possible need for SNF placement upon  discharge: not anticipated  Patient Condition: I have reviewed medical records from [facility] , spoken with CM, and patient and spouse. I met with patient at the bedside for inpatient rehabilitation assessment.  Patient will benefit from ongoing PT and OT, can actively participate in 3 hours of therapy a day 5 days of the week, and can make measurable gains during the admission.  Patient will also benefit from the coordinated team approach during an Inpatient Acute Rehabilitation admission.  The patient will receive intensive therapy as well as Rehabilitation physician, nursing, social worker, and care management interventions.  Due to bladder management, bowel management, safety, skin/wound care, pain management and patient education the patient requires 24 hour a day rehabilitation nursing.  The patient is currently mod assist with mobility and basic ADLs.  Discharge setting and therapy post discharge at home with home health is anticipated.  Patient has agreed to participate in the Acute Inpatient Rehabilitation Program and will admit today.  Preadmission Screen Completed By:  Cleatrice Burke, RN MSN 02/15/2019 2:52 PM ______________________________________________________________________   Discussed status with Dr. Posey Pronto  on  02/15/2019 at  1507 and received approval for admission today.  Admission Coordinator:  Danne Baxter  Jefm Bryant, RN, time  1507 Date  02/15/2019   Assessment/Plan: Diagnosis: left patella tendon rupture; debility  1. Does the need for close, 24 hr/day Medical supervision in concert with the patient's rehab needs make it unreasonable for this patient to be served in a less intensive setting? Yes  2. Co-Morbidities requiring supervision/potential complications: AVR CHF (Monitor in accordance with increased physical activity and avoid UE resistance excercises), atrial fibrillation is chronic Coumadin, bipolar disorder maintained on lithium, mild cognitive impairment  maintained on Aricept 3. Due to safety, disease management, medication administration and patient education, does the patient require 24 hr/day rehab nursing? Yes 4. Does the patient require coordinated care of a physician, rehab nurse, PT (1-2 hrs/day, 5 days/week) and OT (1-2 hrs/day, 5 days/week) to address physical and functional deficits in the context of the above medical diagnosis(es)? Yes Addressing deficits in the following areas: balance, endurance, locomotion, strength, transferring, bathing, dressing, toileting and psychosocial support 5. Can the patient actively participate in an intensive therapy program of at least 3 hrs of therapy 5 days a week? Yes 6. The potential for patient to make measurable gains while on inpatient rehab is excellent 7. Anticipated functional outcomes upon discharge from inpatients are: supervision and min assist PT, supervision and min assist OT, n/a SLP 8. Estimated rehab length of stay to reach the above functional goals is: 14-18 days. 9. Anticipated D/C setting: Home 10. Anticipated post D/C treatments: HH therapy and Home excercise program 11. Overall Rehab/Functional Prognosis: good  MD Signature: Delice Lesch, MD, ABPMR

## 2019-02-16 ENCOUNTER — Inpatient Hospital Stay (HOSPITAL_COMMUNITY): Payer: Medicare Other | Admitting: Physical Therapy

## 2019-02-16 ENCOUNTER — Inpatient Hospital Stay (HOSPITAL_COMMUNITY): Payer: Medicare Other | Admitting: Occupational Therapy

## 2019-02-16 DIAGNOSIS — D649 Anemia, unspecified: Secondary | ICD-10-CM

## 2019-02-16 DIAGNOSIS — I4891 Unspecified atrial fibrillation: Secondary | ICD-10-CM

## 2019-02-16 DIAGNOSIS — D72829 Elevated white blood cell count, unspecified: Secondary | ICD-10-CM

## 2019-02-16 DIAGNOSIS — I4819 Other persistent atrial fibrillation: Secondary | ICD-10-CM

## 2019-02-16 DIAGNOSIS — S86812S Strain of other muscle(s) and tendon(s) at lower leg level, left leg, sequela: Secondary | ICD-10-CM

## 2019-02-16 DIAGNOSIS — R739 Hyperglycemia, unspecified: Secondary | ICD-10-CM

## 2019-02-16 DIAGNOSIS — E8809 Other disorders of plasma-protein metabolism, not elsewhere classified: Secondary | ICD-10-CM

## 2019-02-16 LAB — COMPREHENSIVE METABOLIC PANEL
ALT: 24 U/L (ref 0–44)
AST: 21 U/L (ref 15–41)
Albumin: 2.4 g/dL — ABNORMAL LOW (ref 3.5–5.0)
Alkaline Phosphatase: 76 U/L (ref 38–126)
Anion gap: 4 — ABNORMAL LOW (ref 5–15)
BUN: 20 mg/dL (ref 8–23)
CO2: 30 mmol/L (ref 22–32)
Calcium: 8.7 mg/dL — ABNORMAL LOW (ref 8.9–10.3)
Chloride: 104 mmol/L (ref 98–111)
Creatinine, Ser: 0.95 mg/dL (ref 0.61–1.24)
GFR calc Af Amer: >60 mL/min (ref >60)
GFR calc non Af Amer: >60 mL/min (ref >60)
Glucose, Bld: 121 mg/dL — ABNORMAL HIGH (ref 70–99)
Potassium: 5 mmol/L (ref 3.5–5.1)
Sodium: 138 mmol/L (ref 135–145)
Total Bilirubin: 0.4 mg/dL (ref 0.3–1.2)
Total Protein: 6.1 g/dL — ABNORMAL LOW (ref 6.5–8.1)

## 2019-02-16 LAB — CBC WITH DIFFERENTIAL/PLATELET
Abs Immature Granulocytes: 0.28 10*3/uL — ABNORMAL HIGH (ref 0.00–0.07)
Basophils Absolute: 0 10*3/uL (ref 0.0–0.1)
Basophils Relative: 0 %
Eosinophils Absolute: 0.3 10*3/uL (ref 0.0–0.5)
Eosinophils Relative: 3 %
HCT: 31.2 % — ABNORMAL LOW (ref 39.0–52.0)
Hemoglobin: 9.6 g/dL — ABNORMAL LOW (ref 13.0–17.0)
Immature Granulocytes: 2 %
Lymphocytes Relative: 15 %
Lymphs Abs: 1.7 10*3/uL (ref 0.7–4.0)
MCH: 26.9 pg (ref 26.0–34.0)
MCHC: 30.8 g/dL (ref 30.0–36.0)
MCV: 87.4 fL (ref 80.0–100.0)
Monocytes Absolute: 0.5 10*3/uL (ref 0.1–1.0)
Monocytes Relative: 5 %
Neutro Abs: 8.7 10*3/uL — ABNORMAL HIGH (ref 1.7–7.7)
Neutrophils Relative %: 75 %
Platelets: 515 10*3/uL — ABNORMAL HIGH (ref 150–400)
RBC: 3.57 MIL/uL — ABNORMAL LOW (ref 4.22–5.81)
RDW: 15.4 % (ref 11.5–15.5)
WBC: 11.5 10*3/uL — ABNORMAL HIGH (ref 4.0–10.5)
nRBC: 0 % (ref 0.0–0.2)

## 2019-02-16 LAB — PROTIME-INR
INR: 2.7 — ABNORMAL HIGH (ref 0.8–1.2)
Prothrombin Time: 28.1 seconds — ABNORMAL HIGH (ref 11.4–15.2)

## 2019-02-16 MED ORDER — CYANOCOBALAMIN 1000 MCG/ML IJ SOLN
1000.0000 ug | Freq: Once | INTRAMUSCULAR | Status: AC
  Administered 2019-02-16: 16:00:00 1000 ug via INTRAMUSCULAR
  Filled 2019-02-16: qty 1

## 2019-02-16 MED ORDER — WARFARIN SODIUM 1 MG PO TABS
1.0000 mg | ORAL_TABLET | Freq: Once | ORAL | Status: AC
  Administered 2019-02-16: 1 mg via ORAL
  Filled 2019-02-16: qty 1

## 2019-02-16 MED ORDER — PRO-STAT SUGAR FREE PO LIQD
30.0000 mL | Freq: Two times a day (BID) | ORAL | Status: DC
  Administered 2019-02-16 – 2019-03-02 (×29): 30 mL via ORAL
  Filled 2019-02-16 (×28): qty 30

## 2019-02-16 NOTE — Plan of Care (Signed)
Inpatient Rehabilitation  Due to the current state of emergency from COVID-19, patients may not be receiving their 3-hours of Medicare-mandated therapy.    

## 2019-02-16 NOTE — Plan of Care (Signed)
  Problem: Consults Goal: RH GENERAL PATIENT EDUCATION Description See Patient Education module for education specifics. Outcome: Progressing   Problem: RH BLADDER ELIMINATION Goal: RH STG MANAGE BLADDER WITH ASSISTANCE Description STG Manage Bladder With min Assistance  Outcome: Progressing   Problem: RH SKIN INTEGRITY Goal: RH STG SKIN FREE OF INFECTION/BREAKDOWN Description Manage with min assist  Outcome: Progressing Goal: RH STG MAINTAIN SKIN INTEGRITY WITH ASSISTANCE Description STG Maintain Skin Integrity With min Assistance.  Outcome: Progressing Goal: RH STG ABLE TO PERFORM INCISION/WOUND CARE W/ASSISTANCE Description STG Able To Perform Incision/Wound Care With min  Assistance.  Outcome: Progressing   Problem: RH SAFETY Goal: RH STG ADHERE TO SAFETY PRECAUTIONS W/ASSISTANCE/DEVICE Description STG Adhere to Safety Precautions With Assistance/Device. Outcome: Progressing   Problem: RH PAIN MANAGEMENT Goal: RH STG PAIN MANAGED AT OR BELOW PT'S PAIN GOAL Description At or below level 4  Outcome: Progressing   Problem: RH KNOWLEDGE DEFICIT GENERAL Goal: RH STG INCREASE KNOWLEDGE OF SELF CARE AFTER HOSPITALIZATION Description Wife will be able to explain management of care at discharge independently using handouts/educational resources provided.  Outcome: Progressing

## 2019-02-16 NOTE — Progress Notes (Signed)
Physical Therapy Session Note  Patient Details  Name: Russell Dillon MRN: [id number] Date of Birth: 1945-12-31  Today's Date: 02/16/2019 PT Individual Time: 1150-1229 PT Individual Time Calculation (min): 39 min   Short Term Goals: Week 1:  PT Short Term Goal 1 (Week 1): Pt will perform sit<>stand transfers using LRAD with no more than mod assist PT Short Term Goal 2 (Week 1): Pt wil perform bed<>chair transfers with no more than mod assist consistently PT Short Term Goal 3 (Week 1): Pt will ambulate at least 24ft using LRAD with max assist PT Short Term Goal 4 (Week 1): Pt will initiate stair training  Skilled Therapeutic Interventions/Progress Updates:   Session 1:  Pt in supine and agreeable to therapy, pain as detailed below. Mod assist supine>sit. Max assist slide board transfer to w/c w/ therapist providing total assist for LLE management during transfer. Instructed on self-propelling w/c w/ BUEs to work on independence w/ locomotion. Able to self-propel 50' x5 w/ mod assist for turns, steering, and occasional supervision for 10-20 sec at a time. Sit<>stands to RW x2 w/ max assist to boost and max verbal, tactile cues to maintain LLE in extension during transitional movements. Verbal, manual, tactile cues throughout session for processing, sequencing of novel tasks, and for safety. Ended session in w/c, all needs in reach.   Session 2:  Pt in w/c and noted to have not eaten lunch. Provided max encouragement to eat, increased initiation of self-feeding once all items on tray had been set-up. Made nursing staff and treatment team aware of need for set-up assist. Min verbal cues for processing and functional problem solving, per chart review this is baseline. Total assist w/c transport to/from therapy gym. Worked on LLE hamstring stretching. Passive stretch in 30 sec bouts x5 w/ verbal and tactile cues for muscle relaxation to decrease discomfort of stretch. Educated pt on his surgical  precautions, importance of stretching, and importance of utilizing alternative pain relief strategies in addition to medication. Will continue to reinforce 2/2 baseline cognitive deficits. Returned to room and performed slide board transfer to EOB w/ mod assist, good carryover from morning's session - needed less cues for sequencing. Ended session in supine, all needs in reach. Ice applied to posterior knee for pain relief, made NT aware.   Therapy Documentation Precautions:  Precautions Precautions: Fall Precaution Comments: maternity pads used to prevent [name] from rubbing Required Braces or Orthoses: Other Brace Other Brace: Left knee hinged brace locked in extension at all times. NO KNEE ROM Restrictions Weight Bearing Restrictions: Yes LLE Weight Bearing: Weight bearing as tolerated Pain: Pain Assessment Pain Scale: 0-10 Pain Score: 0-No pain Pain Type: Acute pain;Surgical pain Pain Location: Leg Pain Orientation: Left Pain Descriptors / Indicators: Aching;Discomfort Pain Frequency: Intermittent Pain Onset: Progressive Patients Stated Pain Goal: 2 Pain Intervention(s): Other (Comment)(Therapy to tolerance)  Therapy/Group: Individual Therapy  Aanya Haynes K Zymire Turnbo 02/16/2019, 12:30 PM

## 2019-02-16 NOTE — Progress Notes (Signed)
ANTICOAGULATION CONSULT NOTE - Follow Up Consult  Pharmacy Consult for Coumadin Indication: atrial fibrillation  No Known Allergies  Patient Measurements: Height: 5\' 10"  (177.8 cm) Weight: 195 lb 12.3 oz (88.8 kg) IBW/kg (Calculated) : 73 Heparin Dosing Weight: 89.7 kg  Vital Signs: Temp: 97.7 F (36.5 C) (04/07 0416) Temp Source: Oral (04/07 0416) BP: 115/67 (04/07 0416) Pulse Rate: 88 (04/07 0416)  Labs: Recent Labs    02/14/19 0454 02/15/19 0240 02/16/19 0409  HGB 9.0* 9.3* 9.6*  HCT 28.6* 28.6* 31.2*  PLT 425* 465* 515*  LABPROT 33.9* 30.8* 28.1*  INR 3.4* 3.0* 2.7*  CREATININE 0.91  --  0.95    Estimated Creatinine Clearance: 76.5 mL/min (by C-G formula based on SCr of 0.95 mg/dL).  Assessment: 73 yo male on chronic Coumadin 5mg  daily exc 2.5mg  on Sun PTA. Admit INR was 6.2. Given vit K 5mg  IV > 1.6. Resumed Coumadin. Severe drug interaction between Coumadin and Flagyl (last dose was 4/2) INR  jumped to 3.4 and after holding now back down to 2.7. Hgb 9.6, plts 515  Goal of Therapy:  INR 2-3 Monitor platelets by anticoagulation protocol: Yes   Plan:  Give Coumadin 1mg  PO x 1 tonight Monitor daily INR, CBC, s/s of bleed  Elenor Quinones, PharmD, BCPS, BCIDP Clinical Pharmacist 02/16/2019 9:19 AM

## 2019-02-16 NOTE — Evaluation (Addendum)
Physical Therapy Assessment and Plan  Patient Details  Name: Russell Dillon MRN: [id number] Date of Birth: 12/07/1945  PT Diagnosis: Abnormality of gait, Difficulty walking, Impaired sensation, Muscle weakness and Pain in L LE Rehab Potential: Good ELOS: 10-14 days   Today's Date: 02/16/2019 PT Individual Time: 0932-1030 PT Individual Time Calculation (min): 58 min    Problem List:  Patient Active Problem List   Diagnosis Date Noted  . Rupture of left patellar tendon 02/15/2019  . Postoperative pain   . Benign prostatic hyperplasia   . Bipolar 1 disorder ([name]) 02/14/2019  . Dementia without behavioral disturbance ([name]) 02/14/2019  . Patellar tendon rupture, left, initial encounter 02/10/2019  . SIRS (systemic inflammatory response syndrome) ([place]) 02/09/2019  . Osteomyelitis ([place]) 02/09/2019  . Acute blood loss anemia 02/09/2019  . Aortic valve regurgitation 10/16/2018  . Encounter for therapeutic drug monitoring 12/02/2013  . Small bowel obstruction ([place]) 07/08/2013  . Hyperlipidemia 12/20/2011  . HTN (hypertension) 06/04/2011  . Atrial fibrillation, chronic 03/01/2011  . Long term (current) use of anticoagulants 03/01/2011  . GERD 10/11/2009  . DIVERTICULOSIS OF COLON 10/11/2009    Past Medical History:  Past Medical History:  Diagnosis Date  . Aortic valve regurgitation 10/16/2018   Echo 07/28/2017:  EF 60-65, moderate AI, aortic root and ascending aorta mildly dilated (40 mm), ascending aorta 43 mm, MAC, mild MR, mild LAE, moderate RV enlargement, trivial TR // Echo 12/19: EF 60-65, normal wall motion, mild AI, moderate BAE, ascending aorta 43 mm, aortic root 39 mm  . BPH (benign prostatic hypertrophy)   . Chronic anticoagulation   . Chronic atrial fibrillation   . Diastolic dysfunction October 2010   Normal LV systolic function  . Hx SBO   . Hyperlipidemia   . Hypertension   . Patellar tendon rupture, left, initial encounter 02/10/2019  . Small bowel obstruction  Russell Dillon)    Past Surgical History:  Past Surgical History:  Procedure Laterality Date  . APPENDECTOMY  2004  . APPLICATION OF WOUND VAC Left 02/11/2019   Procedure: Application Of Wound Vac;  Surgeon: Russell Maries, MD;  Location: [place];  Service: Orthopedics;  Laterality: Left;  . CARDIOVASCULAR STRESS TEST  09/30/2007   EF 67%  No ischemia.   Marland Kitchen CARDIOVERSION  05/01/2004  . KNEE ARTHROSCOPY Left 02/11/2019   Procedure: ARTHROSCOPY LEFT KNEE;  Surgeon: Russell Bushnell, MD;  Location: [place];  Service: Orthopedics;  Laterality: Left;  . PATELLAR TENDON REPAIR Left 02/11/2019   Procedure: LEFT PATELLA TENDON REPAIR;  Surgeon: Russell [place], MD;  Location: [place];  Service: Orthopedics;  Laterality: Left;  . SMALL INTESTINE SURGERY  2004  . US ECHOCARDIOGRAPHY  09/05/2009   ef 55-60%    Assessment & Plan Clinical Impression: Patient is a 73 y.o. year old male with history of AVR, atrial fibrillation is chronic Coumadin, bipolar disorder maintained on lithium, mild cognitive impairment maintained on Aricept.  Per chart review, patient lives with spouse. Independent up until 2 weeks ago with increasing knee pain. 2 level home. Wife with a rotator cuff tear and limited physically. Presented 01/30/2019 with increasing knee pain over the past 10 days and recent fall.Patient with low-grade fever. Hemoglobin on admission of 4, INR 4.3, WBC 18,400, BNP 220, lactic acid 1.8, urinalysis negative. Cranial CT scan negative for acute changes.CT abdomen pelvis showed no acute abnormality. Cholelithiasis without acute inflammation. Initially placed on broad-spectrum antibiotics.Coumadin was reversed with vitamin K. 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 MRI  showed bilateral meniscus tear, left patella tendon tear with lateral distal femur and tibial plateau nondisplaced fractures. Patient underwent left knee patellar tendon repair, arthroscopy left knee with partial medial and lateral menisc with wound  VAC application [id number] and return to the OR for patellar tendon repair meniscectomies 02/13/2019. Weightbearing as tolerated left knee hinged brace locked in extension at all times. No knee range of motion. Initially maintained on antibiotic therapy for suspect sepsis later discontinued 02/12/2019 and monitored white blood cell count normalized 10.3. leukocytosis was felt to be induced by initial fracture. Chronic Coumadin has been resumed. Hemoglobin stabilized 9.3. Therapy evaluations completed with recommendations of physical medicine rehabilitation consult. Patient transferred to CIR on 02/15/2019 .   Patient currently requires max with mobility secondary to muscle weakness, decreased cardiorespiratoy endurance, impaired timing and sequencing, unbalanced muscle activation and decreased motor planning and decreased sitting balance, decreased standing balance, decreased postural control, decreased balance strategies and difficulty maintaining precautions.  Prior to hospitalization, patient was independent  with mobility and lived with Spouse in a House home.  Home access is 4-5Stairs to enter.  Patient will benefit from skilled PT intervention to maximize safe functional mobility, minimize fall risk and decrease caregiver burden for planned discharge home with 24 hour assist.  Anticipate patient will benefit from follow up [facility] at discharge.  PT - End of Session Activity Tolerance: Tolerates 30+ min activity with multiple rests Endurance Deficit: Yes Endurance Deficit Description: pain and decreased endurance limit participation PT Assessment Rehab Potential (ACUTE/IP ONLY): Good PT Barriers to Discharge: Inaccessible home environment;Decreased caregiver support;Home environment access/layout PT Patient demonstrates impairments in the following area(s): Balance;Pain;Motor PT Transfers Functional Problem(s): Bed Mobility;Bed to Chair;Car;Furniture PT Locomotion Functional Problem(s):  Ambulation;Wheelchair Mobility;Stairs PT Plan PT Intensity: Minimum of 1-2 x/day ,45 to 90 minutes PT Frequency: 5 out of 7 days PT Duration Estimated Length of Stay: 10-14 days PT Treatment/Interventions: Ambulation/gait training;Discharge planning;DME/adaptive equipment instruction;Functional mobility training;Pain management;Psychosocial support;Splinting/orthotics;Therapeutic Activities;UE/LE Strength taining/ROM;Balance/vestibular training;Functional electrical stimulation;Neuromuscular re-education;Patient/family education;Skin care/wound management;Stair training;Therapeutic Exercise;UE/LE Coordination activities;Wheelchair propulsion/positioning PT Transfers Anticipated Outcome(s): min assist for bed<>chair transfers PT Locomotion Anticipated Outcome(s): mod assist ambulating short distances with LRAD PT Recommendation Recommendations for Other Services: Neuropsych consult;Therapeutic Recreation consult Therapeutic Recreation Interventions: Pet therapy;Stress management Follow Up Recommendations: Home health PT;24 hour supervision/assistance Patient destination: Home Equipment Recommended: To be determined  Skilled Therapeutic Intervention Evaluation completed (see details above and below) with education on PT POC and goals and individual treatment initiated with focus on education regarding L LE precautions, bed mobility, bed<>chair transfers, w/c parts and mobility, and progression to ambulation. Pt received sitting in recliner and agreeable to limited therapy session reporting he was very tired and needed to rest. Pt wearing L LE knee hinged brace locked in extension for session. Pt performed R lateral scoot transfer recliner to w/c with max assist for lifting and pivoting with cuing throughout for sequencing. Therapist provided w/c cushion for pressure relief and L LE elevating leg rest for positioning of knee locked in extension brace. Pt educated on w/c parts including leg rests,  breaks, and how to use B UEs to perform w/c propulsion. Pt propelled w/c ~57f prior to reporting he was tired and defered continued w/c mobility. Pt performed R lateral scoot transfer w/c to bed with max assist for lifting and pivoting with cuing for sequencing throughout. Pt agreeable to perform 1 sit<>stand from elevated EOB to RW with max assist for lifting and cuing for proper UE placement to perform transfer -  pt unable to achieve full upright due to maintaining forward flexed posture despite maximal assist and cuing. Pt performed sit to supine with mod assist for B LE management and cuing for sequencing. Pt performed supine scoot towards HOB with max assist for scooting with bed in trendelenburg and cuing for use of B UEs on bedrails to perform task. Pt demonstrated impaired processing and motor planning throughout therapy session requiring increased cuing and time to perform mobility tasks. Pt left supine in bed with needs in reach and bed alarm on with pt quickly falling asleep upon therapist exit.   PT Evaluation Precautions/Restrictions Precautions Precautions: Fall Precaution Comments: maternity pads used to prevent [name] from rubbing Required Braces or Orthoses: Other Brace Other Brace: Left knee hinged brace locked in extension at all times. NO KNEE ROM Restrictions Weight Bearing Restrictions: Yes LLE Weight Bearing: Weight bearing as tolerated Pain Pain Assessment Pain Scale: 0-10 Pain Score: 0-No pain Faces Pain Scale: Hurts even more Pain Type: Acute pain;Surgical pain Pain Location: Leg Pain Orientation: Left Reports 0/10 pain at beginning of session; however, with L LE movement reports pain but not rated. Pain Intervention(s): Other (Comment)(Therapy to tolerance) Home Living/Prior Functioning Home Living Available Help at Discharge: Family;Available 24 hours/day Type of Home: House Home Access: Stairs to enter [employer] of Steps: 4-5 Entrance  Stairs-Rails: Can reach both Home Layout: Two level;Bed/bath upstairs Alternate Level Stairs-Number of Steps: pt reports he sleeps on 1st floor Bathroom Shower/Tub: Multimedia programmer: Standard Bathroom Accessibility: Yes Additional Comments: Pt wife has current rotator cuff tear  Lives With: Spouse Prior Function Level of Independence: Independent with basic ADLs;Independent with homemaking with ambulation;Independent with gait;Independent with transfers;Other (comment)(pt possibly poor historian due to hx of memory impairments)  Able to Take Stairs?: Yes Driving: Yes 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 [place]: Hobbies-yes (Comment) Comments: pt reports no DME at home; possible poor historian due to baseline memory impairments; enjoys watching the news Vision/Perception  Perception Perception: Within Functional Limits Praxis Praxis: Intact  Cognition Overall Cognitive Status: History of cognitive impairments - at baseline Arousal/Alertness: Awake/alert Orientation Level: Oriented to person;Oriented to place;Oriented to situation;Disoriented to situation;Oriented to time(reports he is here for therapy but unable to recall knee surgery ) Attention: Focused;Sustained Focused Attention: Appears intact Sustained Attention: Appears intact Problem Solving: Impaired Safety/Judgment: Appears intact Comments: per chart review, wife reports early dementia Sensation Sensation Light Touch: Impaired Detail Light Touch Impaired Details: Impaired LLE Coordination Gross Motor Movements are Fluid and Coordinated: No Coordination and Movement Description: L knee precautions and brace impact gross motor movements of LEs Motor  Motor Motor: Other (comment) Motor - Skilled Clinical Observations: Impaired L LE strength and ROM  Mobility Bed Mobility Bed Mobility: Supine to Sit;Sit to Supine;Rolling Right;Rolling Left Rolling  Right: Moderate Assistance - Patient 50-74% Rolling Left: Moderate Assistance - Patient 50-74% Supine to Sit: Moderate Assistance - Patient 50-74% Sit to Supine: Moderate Assistance - Patient 50-74% Transfers Transfers: Lateral/Scoot Transfers Lateral/Scoot Transfers: Moderate Assistance - Patient 50-74% Transfer (Assistive device): Other (Comment)(transfer board) Locomotion  Gait Ambulation: No(Unable to progress to ambulation) Wheelchair Mobility Wheelchair Mobility: Yes Wheelchair Assistance: Moderate Assistance - Patient 50 - 74% Wheelchair Propulsion: Both upper extremities Wheelchair Parts Management: Needs assistance Distance: ~12f with pt deferring further w/c mobility  Trunk/Postural Assessment  Cervical Assessment Cervical Assessment: Within Functional Limits Thoracic Assessment Thoracic Assessment: Within Functional Limits Lumbar Assessment Lumbar Assessment: Exceptions to WFL(posterior pelvic tilt) Postural Control Postural  Control: Deficits on evaluation Postural Limitations: impaired  Balance Balance Balance Assessed: Yes Static Sitting Balance Static Sitting - Balance Support: Bilateral upper extremity supported;Feet supported Static Sitting - Level of Assistance: 5: Stand by assistance Dynamic Sitting Balance Dynamic Sitting - Balance Support: Feet supported;Bilateral upper extremity supported;During functional activity Dynamic Sitting - Level of Assistance: 4: Min assist Static Standing Balance Static Standing - Balance Support: During functional activity;Bilateral upper extremity supported Static Standing - Level of Assistance: 2: Max assist Static Standing - Comment/# of Minutes: unable to achieve full upright standing Extremity Assessment  RLE Assessment RLE Assessment: Within Functional Limits LLE Assessment LLE Assessment: Exceptions to [facility] General Strength Comments: Unable to formally assess knee strength 2/2 precautions; demonstrates active hip and  ankle movement against gravity demonstrating at least a 3/5 strength    Refer to Care Plan for Long Term Goals  Recommendations for other services: Neuropsych and Therapeutic Recreation  Pet therapy and Stress management  Discharge Criteria: Patient will be discharged from PT if patient refuses treatment 3 consecutive times without medical reason, if treatment goals not met, if there is a change in medical status, if patient makes no progress towards goals or if patient is discharged from hospital.  The above assessment, treatment plan, treatment alternatives and goals were discussed and mutually agreed upon: by patient  Tawana Scale, PT, DPT 02/16/2019, 9:44 AM

## 2019-02-16 NOTE — Evaluation (Signed)
Occupational Therapy Assessment and Plan  Patient Details  Name: Russell Dillon MRN: [id number] Date of Birth: Dec 24, 1945  OT Diagnosis: acute pain, muscle weakness (generalized) and pain in joint Rehab Potential: Rehab Potential (ACUTE ONLY): Good ELOS: 10-14 days   Today's Date: 02/16/2019 OT Individual Time: 2876-8115 OT Individual Time Calculation (min): 56 min     Problem List:  Patient Active Problem List   Diagnosis Date Noted  . Rupture of left patellar tendon 02/15/2019  . Postoperative pain   . Benign prostatic hyperplasia   . Bipolar 1 disorder ([place]) 02/14/2019  . Dementia without behavioral disturbance ([place]) 02/14/2019  . Patellar tendon rupture, left, initial encounter 02/10/2019  . SIRS (systemic inflammatory response syndrome) ([place]) 02/09/2019  . Osteomyelitis ([place]) 02/09/2019  . Acute blood loss anemia 02/09/2019  . Aortic valve regurgitation 10/16/2018  . Encounter for therapeutic drug monitoring 12/02/2013  . Small bowel obstruction ([place]) 07/08/2013  . Hyperlipidemia 12/20/2011  . HTN (hypertension) 06/04/2011  . Atrial fibrillation, chronic 03/01/2011  . Long term (current) use of anticoagulants 03/01/2011  . GERD 10/11/2009  . DIVERTICULOSIS OF COLON 10/11/2009    Past Medical History:  Past Medical History:  Diagnosis Date  . Aortic valve regurgitation 10/16/2018   Echo 07/28/2017:  EF 60-65, moderate AI, aortic root and ascending aorta mildly dilated (40 mm), ascending aorta 43 mm, MAC, mild MR, mild LAE, moderate RV enlargement, trivial TR // Echo 12/19: EF 60-65, normal wall motion, mild AI, moderate BAE, ascending aorta 43 mm, aortic root 39 mm  . BPH (benign prostatic hypertrophy)   . Chronic anticoagulation   . Chronic atrial fibrillation   . Diastolic dysfunction October 2010   Normal LV systolic function  . Hx SBO   . Hyperlipidemia   . Hypertension   . Patellar tendon rupture, left, initial encounter 02/10/2019  . Small bowel obstruction  [facility])    Past Surgical History:  Past Surgical History:  Procedure Laterality Date  . APPENDECTOMY  2004  . APPLICATION OF WOUND VAC Left 02/11/2019   Procedure: Application Of Wound Vac;  Surgeon: Altamese [place], MD;  Location: [place];  Service: Orthopedics;  Laterality: Left;  . CARDIOVASCULAR STRESS TEST  09/30/2007   EF 67%  No ischemia.   Marland Kitchen CARDIOVERSION  05/01/2004  . KNEE ARTHROSCOPY Left 02/11/2019   Procedure: ARTHROSCOPY LEFT KNEE;  Surgeon: Altamese [place], MD;  Location: [place];  Service: Orthopedics;  Laterality: Left;  . PATELLAR TENDON REPAIR Left 02/11/2019   Procedure: LEFT PATELLA TENDON REPAIR;  Surgeon: Altamese [place], MD;  Location: [place];  Service: Orthopedics;  Laterality: Left;  . SMALL INTESTINE SURGERY  2004  . US ECHOCARDIOGRAPHY  09/05/2009   ef 55-60%    Assessment & Plan Clinical Impression: Patient is a 73 y.o. right-handed male with history of AVR, atrial fibrillation is chronic Coumadin, bipolar disorder maintained on lithium, mild cognitive impairment maintained on Aricept.  Per chart review, patient lives with spouse. Independent up until 2 weeks ago with increasing knee pain. 2 level home. Wife with a rotator cuff tear and limited physically. Presented 01/30/2019 with increasing knee pain over the past 10 days and recent fall.Patient with low-grade fever. Hemoglobin on admission of 4, INR 4.3, WBC 18,400, BNP 220, lactic acid 1.8, urinalysis negative. Cranial CT scan negative for acute changes.CT abdomen pelvis showed no acute abnormality. Cholelithiasis without acute inflammation. Initially placed on broad-spectrum antibiotics.Coumadin was reversed with vitamin K. 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 MRI showed  bilateral meniscus tear, left patella tendon tear with lateral distal femur and tibial plateau nondisplaced fractures. Patient underwent left knee patellar tendon repair, arthroscopy left knee with partial medial and lateral menisc with  wound VAC application 57/32/2567 and return to the OR for patellar tendon repair meniscectomies 02/13/2019. Weightbearing as tolerated left knee hinged brace locked in extension at all times. No knee range of motion. Initially maintained on antibiotic therapy for suspect sepsis later discontinued 02/12/2019 and monitored white blood cell count normalized 10.3. leukocytosis was felt to be induced by initial fracture. Chronic Coumadin has been resumed. Hemoglobin stabilized 9.3. Therapy evaluations completed with recommendations of physical medicine rehabilitation consult. Patient was admitted for a comprehensive rehabilitation program.  Patient transferred to CIR on 02/15/2019 .    Patient currently requires mod-total assist with basic self-care skills secondary to muscle weakness, decreased awareness, decreased memory and delayed processing and decreased standing balance, decreased balance strategies, difficulty maintaining precautions and pain.  Prior to hospitalization, patient could complete ADLs with independent .  Patient will benefit from skilled intervention to decrease level of assist with basic self-care skills prior to discharge home with care partner.  Anticipate patient will require 24 hour supervision and minimal physical assistance and follow up home health.  OT - End of Session Activity Tolerance: Tolerates 30+ min activity with multiple rests Endurance Deficit: Yes Endurance Deficit Description: pain and decreased endurance limit participation OT Assessment Rehab Potential (ACUTE ONLY): Good OT Barriers to Discharge: Home environment access/layout;Lack of/limited family support OT Barriers to Discharge Comments: bed/bath upstairs, wife with rotator cuff injury OT Patient demonstrates impairments in the following area(s): Balance;Endurance;Motor;Pain;Safety OT Basic ADL's Functional Problem(s): Grooming;Bathing;Dressing;Toileting OT Transfers Functional Problem(s): Toilet;Tub/Shower OT  Additional Impairment(s): None OT Plan OT Intensity: Minimum of 1-2 x/day, 45 to 90 minutes OT Frequency: 5 out of 7 days OT Duration/Estimated Length of Stay: 10-14 days OT Treatment/Interventions: Balance/vestibular training;Discharge planning;Disease Lawyer;Functional mobility training;Pain management;Patient/family education;Psychosocial support;Self Care/advanced ADL retraining;Skin care/wound managment;Splinting/orthotics;Therapeutic Activities;Therapeutic Exercise;UE/LE Strength taining/ROM;Wheelchair propulsion/positioning OT Basic Self-Care Anticipated Outcome(s): Min assist OT Toileting Anticipated Outcome(s): Min assist OT Bathroom Transfers Anticipated Outcome(s): Supervision OT Recommendation Patient destination: Home Follow Up Recommendations: Home health OT;24 hour supervision/assistance Equipment Recommended: 3 in 1 bedside comode;Tub/shower seat   Skilled Therapeutic Intervention OT eval completed with discussion of rehab process, OT purpose, POC, ELOS, and goals.  ADL assessment completed with LB bathing and dressing at bed level and UB bathing/dressing seated in recliner.  Upon arrival pt incontinent of bowel, reports he was aware he had gone but was unable to tell when he had to go.  Engaged in rolling for hygiene with +2 for safety and positioning due to pain in LLE with movement.  Pt able to assist in rolling, requiring max cues and wait time for processing.  Donned hospital issue paper pants at bed level total assist with +2 again for rolling.  Pt able to complete sidelying to sitting with mod assist.  Engaged in lateral scoot transfers to recliner with mod assist when transferring to recliner.  Engaged in [place] bathing and dressing seated in recliner with assistance to manage paper top.  Difficult to fully obtain baseline function and home setup due to impaired memory (per chart review, wife reports early dementia).  Pt remained  upright in recliner with seat belt alarm on and all needs in reach.  OT Evaluation Precautions/Restrictions  Precautions Precautions: Fall Precaution Comments: maternity pads used to prevent [name] from rubbing Required Braces or Orthoses: Other Brace  Other Brace: Left knee hinged brace locked in extension at all times. NO KNEE ROM Restrictions Weight Bearing Restrictions: Yes LLE Weight Bearing: Weight bearing as tolerated Pain Pain Assessment Pain Scale: Faces Faces Pain Scale: Hurts even more Pain Type: Acute pain Pain Location: Leg Pain Orientation: Left Pain Descriptors / Indicators: [place] expects to be discharged to:: Private residence Living Arrangements: Spouse/significant other Available Help at Discharge: Family, Available 24 hours/day Type of Home: House Home Access: Stairs to enter Technical brewer of Steps: 4-5 Home Layout: Two level, Bed/bath upstairs Bathroom Shower/Tub: Multimedia programmer: Standard Bathroom Accessibility: Yes Additional Comments: Pt wife has current rotator cuff tear  Lives With: Spouse IADL History Homemaking Responsibilities: Yes Prior Function Level of Independence: Independent with basic ADLs, Independent with gait  Able to Take Stairs?: Yes Driving: Yes Comments: wife reports early dementia; patient denies memory problems ADL ADL Upper Body Bathing: Supervision/safety, Minimal cueing Where Assessed-Upper Body Bathing: Chair Lower Body Bathing: Dependent Where Assessed-Lower Body Bathing: Bed level Upper Body Dressing: Moderate assistance Where Assessed-Upper Body Dressing: Chair Lower Body Dressing: Dependent Where Assessed-Lower Body Dressing: Bed level Toileting: Dependent Where Assessed-Toileting: Bed level Toilet Transfer: Maximal assistance Toilet Transfer Method: (lateral scoot) Toilet Transfer Equipment: Drop arm bedside  commode Vision Baseline Vision/History: Wears glasses Wears Glasses: At all times Patient Visual Report: No change from baseline Vision Assessment?: No apparent visual deficits Cognition Overall Cognitive Status: History of cognitive impairments - at baseline Arousal/Alertness: Awake/alert Orientation Level: Person Year: 2020 Month: February Day of Week: Incorrect("I don't know") Immediate Memory Recall: Sock;Blue;Bed Memory Recall: Sock;Blue Memory Recall Sock: Without Cue Memory Recall Blue: With Cue Attention: Sustained Sustained Attention: Appears intact Problem Solving: Impaired Safety/Judgment: Appears intact Comments: per chart review, wife reports early dementia Sensation Sensation Light Touch: Appears Intact(BUE) Proprioception: Appears Intact(BUE) Coordination Gross Motor Movements are Fluid and Coordinated: No Fine Motor Movements are Fluid and Coordinated: Yes Mobility  Bed Mobility Bed Mobility: Right Sidelying to Sit Right Sidelying to Sit: Moderate Assistance - Patient 50-74%  Extremity/Trunk Assessment RUE Assessment RUE Assessment: Within Functional Limits LUE Assessment LUE Assessment: Exceptions to [facility] Active Range of Motion (AROM) Comments: shoulder flexion limited to grossly 100*, unable to externally rotate to reach to back of head.  Pt reports "no injury that I know of" General Strength Comments: 3/5 proximal due to limited ROM, grossly 5/5 distally     Refer to Care Plan for Long Term Goals  Recommendations for other services: None    Discharge Criteria: Patient will be discharged from OT if patient refuses treatment 3 consecutive times without medical reason, if treatment goals not met, if there is a change in medical status, if patient makes no progress towards goals or if patient is discharged from hospital.  The above assessment, treatment plan, treatment alternatives and goals were discussed and mutually agreed upon: by patient  Ellwood Dense  [facility] 02/16/2019, 8:25 AM

## 2019-02-16 NOTE — Progress Notes (Signed)
[place] PHYSICAL MEDICINE & REHABILITATION PROGRESS NOTE  Subjective/Complaints: Patient seen laying in bed this AM.  He states he slept well overnight.  He is not wearing his brace, discussed with nursing.   ROS: Denies CP, SOB, N/V/D.  Objective: Vital Signs: Blood pressure 115/67, pulse 88, temperature 97.7 F (36.5 C), temperature source Oral, resp. rate 18, height 5\' 10"  (1.778 m), weight 88.8 kg, SpO2 100 %. No results found. Recent Labs    02/15/19 0240 02/16/19 0409  WBC 10.3 11.5*  HGB 9.3* 9.6*  HCT 28.6* 31.2*  PLT 465* 515*   Recent Labs    02/14/19 0454 02/16/19 0409  NA 135 138  K 3.7 5.0  CL 103 104  CO2 24 30  GLUCOSE 116* 121*  BUN 29* 20  CREATININE 0.91 0.95  CALCIUM 8.1* 8.7*    Physical Exam: BP 115/67 (BP Location: Left Arm)   Pulse 88   Temp 97.7 F (36.5 C) (Oral)   Resp 18   Ht 5\' 10"  (1.778 m)   Wt 88.8 kg   SpO2 100%   BMI 28.09 kg/m  Constitutional: Well-developed. NAD. Vital signs reviewed.  HENT: Normocephalic, atraumatic. Eyes: EOMI. No discharge.  Respiratory: Effort normal.  GI: He exhibits no distension.  Musculoskeletal: Left lower extremity with edema and tenderness  Neurological: He is alert.  Patient is pleasantly confused.  Oriented x1 Follow simple commands Motor: Right lower extremity: 4+/5 proximal distal Left lower extremity: Hip flexion 2+/5, knee locked in brace, ankle dorsiflexion 4/5  Skin:  Left knee with immobilizer in place.  Dressing C/D/I  Psychiatric: His speech is delayed. He is slowed. Cognition and memory are impaired.   Assessment/Plan: 1. Functional deficits secondary to left knee patellar tendon rupture and meniscal debridement s/p repair which require 3+ hours per day of interdisciplinary therapy in a comprehensive inpatient rehab setting.  Physiatrist is providing close team supervision and 24 hour management of active medical problems listed below.  Physiatrist and rehab team continue  to assess barriers to discharge/monitor patient progress toward functional and medical goals  Care Tool:  Bathing  Bathing activity did not occur: Safety/medical concerns Body parts bathed by patient: Right arm, Left arm, Chest, Abdomen, Face   Body parts bathed by helper: Front perineal area, Buttocks, Right upper leg, Right lower leg Body parts n/a: Left upper leg, Left lower leg(LLE in knee immobilizer)   Bathing assist Assist Level: 2 Helpers     Upper Body Dressing/Undressing Upper body dressing   What is the patient wearing?: Pull over shirt    Upper body assist Assist Level: Moderate Assistance - Patient 50 - 74%    Lower Body Dressing/Undressing Lower body dressing      What is the patient wearing?: Pants, Incontinence brief     Lower body assist Assist for lower body dressing: 2 Helpers     Toileting Toileting Toileting Activity did not occur Landscape architect and hygiene only): Safety/medical concerns(condom cath.)  Toileting assist       Transfers Chair/bed transfer  Transfers assist     Chair/bed transfer assist level: Maximal Assistance - Patient 25 - 49%     Locomotion Ambulation   Ambulation assist              Walk 10 feet activity   Assist           Walk 50 feet activity   Assist           Walk 150 feet activity  Assist           Walk 10 feet on uneven surface  activity   Assist           Wheelchair     Assist               Wheelchair 50 feet with 2 turns activity    Assist            Wheelchair 150 feet activity     Assist            Medical Problem List and Plan: 1.  Decreased functional mobility secondary to acute on chronic left knee pain status post repair of left patellar tendon rupture and scope debridement of meniscal tears 02-2019 and 02/13/2019. Weightbearing as tolerated with full knee extension brace must be on an full extension no knee range of  motion.  Begin CIR  Brace at all times in locked 2.  Antithrombotics: -DVT/anticoagulation:  Chronic Coumadin             -antiplatelet therapy: N/A 3. Pain Management: hydrocodone as needed 4. Mood: Aricept 10 mg daily             -antipsychotic agents: lithium 150 mg daily 5. Neuropsych: This patient is not capable of making decisions on his own behalf. 6. Skin/Wound Care:  Routine skin checks 7. Fluids/Electrolytes/Nutrition:  Routine ins and outs  BMP within acceptable range on 4/7 8. Acute on chronic anemia.   Hb 9.6 on 4/7 9. Atrial fibrillation. Cardizem 360 mg daily. Cardiac rate controlled.    Monitor with increased mobility  10. BPH. Pro scar 11. Hyperlipidemia. [place] 12. Hyperglycemia  Monitor with increased mobility 13. Leukocytosis  WBCs 11.5 on 4/7   Afebrile  Cont to monitor 14. Hypoalbuminemia  Supplement initiated on 4/7  LOS: 1 days A FACE TO FACE EVALUATION WAS PERFORMED  Ankit Lorie Phenix 02/16/2019, 10:43 AM

## 2019-02-16 NOTE — Care Management Note (Signed)
[place] Individual Statement of Services  Patient Name:  Russell Dillon  Date:  02/16/2019  Welcome to the [place] [facility].  Our goal is to provide you with an individualized program based on your diagnosis and situation, designed to meet your specific needs.  With this comprehensive rehabilitation program, you will be expected to participate in at least 3 hours of rehabilitation therapies Monday-Friday, with modified therapy programming on the weekends.  Your rehabilitation program will include the following services:  Physical Therapy (PT), Occupational Therapy (OT), 24 hour per day rehabilitation nursing, Case Management (Social Worker), Rehabilitation Medicine, Nutrition Services and Pharmacy Services  Weekly team conferences will be held on Wednesday to discuss your progress.  Your Social Worker will talk with you frequently to get your input and to update you on team discussions.  Team conferences with you and your family in attendance may also be held.  Expected length of stay: 10-14 days  Overall anticipated outcome: min-mod level of assist  Depending on your progress and recovery, your program may change. Your Social Worker will coordinate services and will keep you informed of any changes. Your Social Worker's name and contact numbers are listed  below.  The following services may also be recommended but are not provided by the [place] will be made to provide these services after discharge if needed.  Arrangements include referral to agencies that provide these services.  Your insurance has been verified to be:  Medicare & [place] Your primary doctor is:  Marton Redwood  Pertinent information will be shared with your doctor and your insurance company.  Social Worker:  Ovidio Kin, [place] or ([phone]  Information discussed with and copy given to patient by: Elease Hashimoto, 02/16/2019, 10:46 AM

## 2019-02-16 NOTE — Progress Notes (Signed)
Social Work  Social Work Assessment and Plan  Patient Details  Name: Russell Dillon MRN: [id number] Date of Birth: 10-May-1946  Today's Date: 02/16/2019  Problem List:  Patient Active Problem List   Diagnosis Date Noted  . Hypoalbuminemia   . Leukocytosis   . Hyperglycemia   . Atrial fibrillation ([place])   . Acute on chronic anemia   . Rupture of left patellar tendon 02/15/2019  . Postoperative pain   . Benign prostatic hyperplasia   . Bipolar 1 disorder ([place]) 02/14/2019  . Dementia without behavioral disturbance ([place]) 02/14/2019  . Patellar tendon rupture, left, initial encounter 02/10/2019  . SIRS (systemic inflammatory response syndrome) ([place]) 02/09/2019  . Osteomyelitis ([place]) 02/09/2019  . Acute blood loss anemia 02/09/2019  . Aortic valve regurgitation 10/16/2018  . Encounter for therapeutic drug monitoring 12/02/2013  . Small bowel obstruction ([place]) 07/08/2013  . Hyperlipidemia 12/20/2011  . HTN (hypertension) 06/04/2011  . Atrial fibrillation, chronic 03/01/2011  . Long term (current) use of anticoagulants 03/01/2011  . GERD 10/11/2009  . DIVERTICULOSIS OF COLON 10/11/2009   Past Medical History:  Past Medical History:  Diagnosis Date  . Aortic valve regurgitation 10/16/2018   Echo 07/28/2017:  EF 60-65, moderate AI, aortic root and ascending aorta mildly dilated (40 mm), ascending aorta 43 mm, MAC, mild MR, mild LAE, moderate RV enlargement, trivial TR // Echo 12/19: EF 60-65, normal wall motion, mild AI, moderate BAE, ascending aorta 43 mm, aortic root 39 mm  . BPH (benign prostatic hypertrophy)   . Chronic anticoagulation   . Chronic atrial fibrillation   . Diastolic dysfunction October 2010   Normal LV systolic function  . Hx SBO   . Hyperlipidemia   . Hypertension   . Patellar tendon rupture, left, initial encounter 02/10/2019  . Small bowel obstruction [facility])    Past Surgical History:  Past Surgical History:  Procedure Laterality Date  . APPENDECTOMY   2004  . APPLICATION OF WOUND VAC Left 02/11/2019   Procedure: Application Of Wound Vac;  Surgeon: Altamese [place], MD;  Location: [place];  Service: Orthopedics;  Laterality: Left;  . CARDIOVASCULAR STRESS TEST  09/30/2007   EF 67%  No ischemia.   Marland Kitchen CARDIOVERSION  05/01/2004  . KNEE ARTHROSCOPY Left 02/11/2019   Procedure: ARTHROSCOPY LEFT KNEE;  Surgeon: Altamese [place], MD;  Location: [place];  Service: Orthopedics;  Laterality: Left;  . PATELLAR TENDON REPAIR Left 02/11/2019   Procedure: LEFT PATELLA TENDON REPAIR;  Surgeon: Altamese [place], MD;  Location: [place];  Service: Orthopedics;  Laterality: Left;  . SMALL INTESTINE SURGERY  2004  . US ECHOCARDIOGRAPHY  09/05/2009   ef 55-60%   Social History:  reports that he has never smoked. He has never used smokeless tobacco. He reports that he does not drink alcohol or use drugs.  Family / Support Systems Marital Status: Married Patient Roles: Spouse, Parent Spouse/Significant Other: [name] [phone]  [phone] Children: Daughter local Other Supports: Church members and freinds-pt's brother Anticipated Caregiver: Wife and daughter Ability/Limitations of Caregiver: Wife has torn rotator cuff and knee issues, daughter works Building control surveyor Availability: 24/7(supervision-light min assist) Family Dynamics: Close knit family who will pull together to assist with pt's care. They have church members and friends who are willing to assist, but due to COVID-19 can not. Pt's brother is avaliable to assist  Social History Preferred language: English Religion: Baptist Cultural Background: No issues Education: [employer] Read: Yes Write: Yes Employment Status: Retired Public relations account executive Issues: No issues Guardian/Conservator: None-according  to MD pt is not fully capable of making his own decisions while here. Pt was diagnosed with dementia 4 months ago-has more problems at night according to wife. Will look toward wife for any decisions that need  to be made while here.   Abuse/Neglect Abuse/Neglect Assessment Can Be Completed: Yes Physical Abuse: Denies Verbal Abuse: Denies Sexual Abuse: Denies Exploitation of patient/patient's resources: Denies Self-Neglect: Denies  Emotional Status Pt's affect, behavior and adjustment status: Pt is exhausted from his am therapies, and taking a nap. He does want to do well here and get mobile he does not want to burden his wife with her health issues. Wife voiced she can assist some and was prior to admission when he got so bad. Recent Psychosocial Issues: other health issues was diagnosed with dementia four months ago. Has more issues at night according to wife Psychiatric History: History of bipolar and takes medications for this and it is helpful. He may benefit from seeing neuro-psych while here will get input from team. Substance Abuse History: No issues  Patient / Family Perceptions, Expectations & Goals Pt/Family understanding of illness & functional limitations: Pt and wfie can explain reason he is here in the hospital. Both have talked with the MD and feel they have a basic understanding of his condition and treatment moving forward. Will try to keep wife updated. Premorbid pt/family roles/activities: Husband, father, brother, retiree, church member, etc Anticipated changes in roles/activities/participation: resume Pt/family expectations/goals: Pt states: " I want to do good and get moving with this brace."  Wife states: " I hope he does well and I will not have to lift him I can't do that."  [employer]: None Premorbid Home Care/DME Agencies: None Transportation available at discharge: Wife and family Resource referrals recommended: Neuropsychology  Discharge Planning Living Arrangements: Spouse/significant other Support Systems: Spouse/significant other, Children, Other relatives, Water engineer, Social worker community Type of Residence: Private  residence Insurance underwriter Resources: [employer], Multimedia programmer (specify)(AARP) Financial Resources: Fish farm manager, Family Support Financial Screen Referred: No Living Expenses: Own Money Management: Spouse Does the patient have any problems obtaining your medications?: No Home Management: Wife Patient/Family Preliminary Plans: Return home with wife who does have a shoulder and knee issues but reports can assist with supervision-light min and their daughter will assist if can. More people are available due to COVID-19 lay offs. Wife is concerned about his mental health and if this becomes worse wants to take home sooner. [place] Barriers to Discharge: Decreased caregiver support Sw Barriers to Discharge Comments: Wife is limited due to own health issues Social Work Anticipated Follow Up Needs: HH/OP  Clinical Impression Pleasant gentleman who is exhausted from am therapies and resting. Wife is supportive and wants to take him home and worries about his mental health due to not allowing visitors into the hospital due to COVID-19. Will update tomorrow after team conference with goals and target discharge date. Hopefully can get pt to supervision-light min assist level so wife will be able to manage him at home. Will work on discharge needs. Will get input from team regarding neuro-psych referral.  Elease Hashimoto 02/16/2019, 12:50 PM

## 2019-02-16 NOTE — Progress Notes (Signed)
Physical Therapy Session Note  Patient Details  Name: Russell Dillon MRN: [id number] Date of Birth: 01-Jan-1946  Today's Date: 02/16/2019 PT Individual Time: 2355-7322 PT Individual Time Calculation (min): 33 min   Short Term Goals: Week 1:  PT Short Term Goal 1 (Week 1): Pt will perform sit<>stand transfers using LRAD with no more than mod assist PT Short Term Goal 2 (Week 1): Pt wil perform bed<>chair transfers with no more than mod assist consistently PT Short Term Goal 3 (Week 1): Pt will ambulate at least 51ft using LRAD with max assist PT Short Term Goal 4 (Week 1): Pt will initiate stair training  Skilled Therapeutic Interventions/Progress Updates:   Pt received supine in bed and agreeable to therapy session. Pt wearing L knee hinged brace locked in extension for entire therapy session. Pt unable to recall education from AM session regarding L knee precautions - therapist reinforced education. Pt performed supine to sit EOB, HOB partially elevated and using bedrails, with min assist for L LE management. Pt performed R lateral scoot transfer using transfer board with min/mod assist for scooting and max cuing for sequencing of task. Pt transported in w/c to therapy gym for time management and energy conservation. Pt educated on use of transfer board to perform lateral scoot car transfer. Pt initiated L lateral scoot transfer w/c to car using transfer board with mod assist for scooting and max cuing for sequencing of task and UE placement; however, half way through the transfer pt reports that is all he can do and defers continuing to get into the car. Pt transferred back into the w/c using transfer board with min assist for scooting. Pt transported back to room in w/c. Pt educated on proper set-up for lateral scoot transfer w/c to bed including w/c leg rests and armrests as well as proper placement of transfer board.  Pt performed R lateral scoot transfer w/c to EOB using transfer board with  min to mod assist for scooting and mod cuing for sequencing of task. Performed sit to supine with mod assist for L LE management and cuing for sequencing. Pt left supine in bed with needs in reach, L knee brace still on, and bed alarm on.   Therapy Documentation Precautions:  Precautions Precautions: Fall Precaution Comments: maternity pads used to prevent [name] from rubbing Required Braces or Orthoses: Other Brace Other Brace: Left knee hinged brace locked in extension at all times. NO KNEE ROM Restrictions Weight Bearing Restrictions: Yes LLE Weight Bearing: Weight bearing as tolerated  Pain:   Pt reports L knee pain upon adjusting LE onto elevated leg rests but no numerical value provided - therapist readjusted positioning of leg rest and provided rest breaks and emotional support for pain management.     Therapy/Group: Individual Therapy  Tawana Scale, PT, DPT 02/16/2019, 4:44 PM

## 2019-02-16 NOTE — Progress Notes (Signed)
Inpatient Rehabilitation  Patient information reviewed and entered into eRehab system by Janziel Hockett M. Meliah Appleman, M.A., CCC/SLP, PPS Coordinator.  Information including medical coding, functional ability and quality indicators will be reviewed and updated through discharge.    

## 2019-02-17 ENCOUNTER — Inpatient Hospital Stay (HOSPITAL_COMMUNITY): Payer: Medicare Other | Admitting: Occupational Therapy

## 2019-02-17 ENCOUNTER — Inpatient Hospital Stay (HOSPITAL_COMMUNITY): Payer: Medicare Other | Admitting: Physical Therapy

## 2019-02-17 ENCOUNTER — Inpatient Hospital Stay (HOSPITAL_COMMUNITY): Payer: Medicare Other

## 2019-02-17 DIAGNOSIS — I482 Chronic atrial fibrillation, unspecified: Secondary | ICD-10-CM

## 2019-02-17 DIAGNOSIS — S86812D Strain of other muscle(s) and tendon(s) at lower leg level, left leg, subsequent encounter: Secondary | ICD-10-CM

## 2019-02-17 LAB — PROTIME-INR
INR: 2.4 — ABNORMAL HIGH (ref 0.8–1.2)
Prothrombin Time: 25.8 seconds — ABNORMAL HIGH (ref 11.4–15.2)

## 2019-02-17 MED ORDER — WARFARIN SODIUM 2.5 MG PO TABS
2.5000 mg | ORAL_TABLET | Freq: Once | ORAL | Status: AC
  Administered 2019-02-17: 2.5 mg via ORAL
  Filled 2019-02-17: qty 1

## 2019-02-17 NOTE — Patient Care Conference (Signed)
Inpatient RehabilitationTeam Conference and Plan of Care Update Date: 02/17/2019   Time: 11:20 AM    Patient Name: Russell Dillon      Medical Record Number: [id number]  Date of Birth: 1946/10/13 Sex: Male         Room/Bed: 4W23C/4W23C-01 Payor Info: Payor: MEDICARE / Plan: MEDICARE PART A AND B / Product Type: *No Product type* /    Admitting Diagnosis: LEFT PATELLA TENDON REPAIR  Admit Date/Time:  02/15/2019  4:52 PM Admission Comments: No comment available   Primary Diagnosis:  <principal problem not specified> Principal Problem: <principal problem not specified>  Patient Active Problem List   Diagnosis Date Noted  . Hypoalbuminemia   . Leukocytosis   . Hyperglycemia   . Atrial fibrillation ([place])   . Acute on chronic anemia   . Rupture of left patellar tendon 02/15/2019  . Postoperative pain   . Benign prostatic hyperplasia   . Bipolar 1 disorder ([place]) 02/14/2019  . Dementia without behavioral disturbance ([place]) 02/14/2019  . Patellar tendon rupture, left, initial encounter 02/10/2019  . SIRS (systemic inflammatory response syndrome) ([place]) 02/09/2019  . Osteomyelitis ([place]) 02/09/2019  . Acute blood loss anemia 02/09/2019  . Aortic valve regurgitation 10/16/2018  . Encounter for therapeutic drug monitoring 12/02/2013  . Small bowel obstruction ([place]) 07/08/2013  . Hyperlipidemia 12/20/2011  . HTN (hypertension) 06/04/2011  . Atrial fibrillation, chronic 03/01/2011  . Long term (current) use of anticoagulants 03/01/2011  . GERD 10/11/2009  . DIVERTICULOSIS OF COLON 10/11/2009    Expected Discharge Date:    Team Members Present: Physician leading conference: Dr. Alysia Penna Social Worker Present: Ovidio Kin, LCSW Nurse Present: Ellison Carwin, LPN PT Present: Michaelene Song, PT OT Present: Clyda Greener, OT SLP Present: Charolett Bumpers, SLP PPS Coordinator present : Gunnar Fusi     Current Status/Progress Goal Weekly Team Focus  Medical   Left knee  pain is          Bowel/Bladder   Incontinence of bowel & bladder, uses a condom catheter?, LBM 02/14/19  less incontinent episodes  timed toileting?   Swallow/Nutrition/ Hydration             ADL's   mod assist lateral scoot transfer, total assist +2 LB bathing/dressing at bed level  min assist LB bathing, LB dressing, toileting, Supervision UB dressing, grooming, toilet transfers  ADL retraining, transfers, sit > stand, standing balance, endurance, pain management   Mobility   min-mod assist bed mobility, min-mod assist slideboard transfer, max assist to +2 for sit<>stand RW and gait unable, min assist w/c propulsion  min-mod assist   transfers, standing tolerance, w/c mobility, pain management, education   Communication             Safety/Cognition/ Behavioral Observations            Pain   c/o neck pain last night & was given tylenol, was given norco this morning, has tylenol & norco prn  pain scake <4/10  assess & treat as needed   Skin   has left knee surgery site  no new areas of skin breakdown while on rehab  assess q shift      *See Care Plan and progress notes for long and short-term goals.     Barriers to Discharge  Current Status/Progress Possible Resolutions Date Resolved   Physician                    Nursing  PT  Inaccessible home environment;Decreased caregiver support;Home environment access/layout                 OT Home environment access/layout;Lack of/limited family support  bed/bath upstairs, wife with rotator cuff injury             SLP                SW Decreased caregiver support Wife is limited due to own health issues            Discharge Planning/Teaching Needs:  HOme with wife who has physical limitaitons of her own, but feels can do light min and supervision level. Will see how pt progresses      Team Discussion:  Goals min-mod level currently total assist. Using slide board for transfers due to pt does not want to WB on  leg. Nursing working bladder training and bowels. Pain being managed. Need to see if wife can provide the level of care opt will need at DC.  Revisions to Treatment Plan:  2 weeks see how doing next team conference before setting date        I attest that I was present, lead the team conference, and concur with the assessment and plan of the team. Teleconference held due to COVID-19   Elease Hashimoto 02/17/2019, 11:33 AM

## 2019-02-17 NOTE — IPOC Note (Signed)
Overall Plan of Care [facility]) Patient Details Name: Russell Dillon MRN: [id number] DOB: February 02, 1946  Admitting Diagnosis: <principal problem not specified>  Hospital Problems: Active Problems:   Rupture of left patellar tendon   Hypoalbuminemia   Leukocytosis   Hyperglycemia   Atrial fibrillation (HCC)   Acute on chronic anemia     Functional Problem List: Nursing Bladder, Medication Management, Skin Integrity, Endurance, Safety, Pain  PT Balance, Pain, Motor  OT Balance, Endurance, Motor, Pain, Safety  SLP    TR         Basic ADL's: OT Grooming, Bathing, Dressing, Toileting     Advanced  ADL's: OT       Transfers: PT Bed Mobility, Bed to Chair, Car, Manufacturing systems engineer, Metallurgist: PT Ambulation, Emergency planning/management officer, Stairs     Additional Impairments: OT None  SLP        TR      Anticipated Outcomes Item Anticipated Outcome  Self Feeding    Swallowing      Basic self-care  Min assist  Energy manager Transfers Supervision  Bowel/Bladder  manage bladder with min assist  Transfers  min assist for bed<>chair transfers  Locomotion  mod assist ambulating short distances with LRAD  Communication     Cognition     Pain  pain at or below level 4  Safety/Judgment  manage safety with min assist   Therapy Plan: PT Intensity: Minimum of 1-2 x/day ,45 to 90 minutes PT Frequency: 5 out of 7 days PT Duration Estimated Length of Stay: 10-14 days OT Intensity: Minimum of 1-2 x/day, 45 to 90 minutes OT Frequency: 5 out of 7 days OT Duration/Estimated Length of Stay: 10-14 days      Team Interventions: Nursing Interventions Patient/Family Education, Pain Management, Skin Care/Wound Management, Bladder Management, Medication Management, Discharge Planning  PT interventions Ambulation/gait training, Discharge planning, DME/adaptive equipment instruction, Functional mobility training, Pain management, Psychosocial support,  Splinting/orthotics, Therapeutic Activities, UE/LE Strength taining/ROM, Balance/vestibular training, Functional electrical stimulation, Neuromuscular re-education, Patient/family education, Skin care/wound management, Stair training, Therapeutic Exercise, UE/LE Coordination activities, Wheelchair propulsion/positioning  OT Interventions Training and development officer, Discharge planning, Disease mangement/prevention, DME/adaptive equipment instruction, Functional mobility training, Pain management, Patient/family education, Psychosocial support, Self Care/advanced ADL retraining, Skin care/wound managment, Splinting/orthotics, Therapeutic Activities, Therapeutic Exercise, UE/LE Strength taining/ROM, Wheelchair propulsion/positioning  SLP Interventions    TR Interventions    SW/CM Interventions Discharge Planning, Psychosocial Support, Patient/Family Education   Barriers to Discharge MD  Medical stability and Lack of/limited family support  Nursing      PT Inaccessible home environment, Decreased caregiver support, Home environment access/layout    OT Home environment access/layout, Lack of/limited family support bed/bath upstairs, wife with rotator cuff injury  SLP      SW Decreased caregiver support Wife is limited due to own health issues   Team Discharge Planning: Destination: PT-Home ,OT- Home , SLP-  Projected Follow-up: PT-Home health PT, 24 hour supervision/assistance, OT-  Home health OT, 24 hour supervision/assistance, SLP-  Projected Equipment Needs: PT-To be determined, OT- 3 in 1 bedside comode, Tub/shower seat, SLP-  Equipment Details: PT- , OT-  Patient/family involved in discharge planning: PT- Patient,  OT-Patient, SLP-   MD ELOS: 10-14d Medical Rehab Prognosis:  Good Assessment:  73 year old right-handed male with history of AVR, atrial fibrillation is chronic Coumadin, bipolar disorder maintained on lithium, mild cognitive impairment maintained on Aricept.  Per chart  review, patient lives with spouse. Independent  up until 2 weeks ago with increasing knee pain. 2 level home. Wife with a rotator cuff tear and limited physically. Presented 01/30/2019 with increasing knee pain over the past 10 days and recent fall.Patient with low-grade fever. Hemoglobin on admission of 4, INR 4.3, WBC 18,400, BNP 220, lactic acid 1.8, urinalysis negative. Cranial CT scan negative for acute changes.CT abdomen pelvis showed no acute abnormality. Cholelithiasis without acute inflammation. Initially placed on broad-spectrum antibiotics.Coumadin was reversed with vitamin K. 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 MRI showed bilateral meniscus tear, left patella tendon tear with lateral distal femur and tibial plateau nondisplaced fractures. Patient underwent left knee patellar tendon repair, arthroscopy left knee with partial medial and lateral menisc with wound VAC application 03/55/9741 and return to the OR for patellar tendon repair meniscectomies 02/13/2019. Weightbearing as tolerated left knee hinged brace locked in extension at all times. No knee range of motion. Initially maintained on antibiotic therapy for suspect sepsis later discontinued 02/12/2019 and monitored white blood cell count normalized 10.3. leukocytosis was felt to be induced by initial fracture. Chronic Coumadin has been resumed. Hemoglobin stabilized 9.3   Now requiring 24/7 Rehab RN,MD, as well as CIR level PT, OT  Treatment team will focus on ADLs and mobility with goals set at [place] will need to see if wife can provide See Team Conference Notes for weekly updates to the plan of care

## 2019-02-17 NOTE — Progress Notes (Signed)
Physical Therapy Session Note  Patient Details  Name: Russell Dillon MRN: [id number] Date of Birth: Jul 28, 1946  Today's Date: 02/17/2019 PT Individual Time: 1030-1110 PT Individual Time Calculation (min): 40 min   Short Term Goals: Week 1:  PT Short Term Goal 1 (Week 1): Pt will perform sit<>stand transfers using LRAD with no more than mod assist PT Short Term Goal 2 (Week 1): Pt wil perform bed<>chair transfers with no more than mod assist consistently PT Short Term Goal 3 (Week 1): Pt will ambulate at least 53f using LRAD with max assist PT Short Term Goal 4 (Week 1): Pt will initiate stair training  Skilled Therapeutic Interventions/Progress Updates:    [facility] but appearing confused this morning, able to state self, location, and year but without understanding of admission to hospital and was reoriented as appropriate. Began session with WC mobility for improved functional activity tolerance, patient fatigued this morning and able to self-propel distances of 50-1040fbefore taking rest breaks, required MinA due to tendency to drift to the R and L especially when fatigued and distracted. [facility] for correct set up of lateral scoot transfer, also required MaxA and heavy VC/TC for correct hand placement and sequencing for lateral scoot today, then able to perform sit to supine with ModA. Finished session with functional bed exercises including SLRs, quad sets, ankle pumps, and supine hip ABD today, Max cues and patient with difficulty sequencing and also perseverating on ankle pumps during this part of session. He was left in bed per his request with alarm active, all other needs met this morning.   Therapy Documentation Precautions:  Precautions Precautions: Fall Precaution Comments: maternity pads used to prevent [name] from rubbing Required Braces or Orthoses: Other Brace Other Brace: Left knee hinged brace locked in  extension at all times. NO KNEE ROM Restrictions Weight Bearing Restrictions: Yes LLE Weight Bearing: Weight bearing as tolerated General:   Vital Signs:   Pain: Pain Assessment Pain Scale: Faces Faces Pain Scale: Hurts little more Pain Type: Acute pain Pain Location: Other (Comment)(general discomfort, unable to state/explain location to PT ) Pain Orientation: Other (Comment)(general pain/discomfort ) Pain Descriptors / Indicators: Discomfort Pain Onset: On-going Patients Stated Pain Goal: 0 Pain Intervention(s): Repositioned Multiple Pain Sites: No    Therapy/Group: Individual Therapy  KrDeniece ReeT, DPT, CBIS  Supplemental Physical Therapist [facility]  Pager [phone] Re[facility] Office [phone] 02/17/2019, 12:03 PM

## 2019-02-17 NOTE — Progress Notes (Signed)
[place] PHYSICAL MEDICINE & REHABILITATION PROGRESS NOTE  Subjective/Complaints: Using condom cath , Nor[employer] working for knee pain  ROS: Denies CP, SOB, N/V/D.  Objective: Vital Signs: Blood pressure 108/84, pulse 79, temperature 98.3 F (36.8 C), temperature source Oral, resp. rate 18, height _0  (1.778 m), weight 89.2 kg, SpO2 100 %. No results found. Recent Labs    02/15/19 0240 02/16/19 0409  WBC 10.3 11.5*  HGB 9.3* 9.6*  HCT 28.6* 31.2*  PLT 465* 515*   Recent Labs    02/16/19 0409  NA 138  K 5.0  CL 104  CO2 30  GLUCOSE 121*  BUN 20  CREATININE 0.95  CALCIUM 8.7*    Physical Exam: BP 108/84 (BP Location: Right Arm)   Pulse 79   Temp 98.3 F (36.8 C) (Oral)   Resp 18   Ht _1  (1.778 m)   Wt 89.2 kg   SpO2 100%   BMI 28.22 kg/m  Constitutional: Well-developed. NAD. Vital signs reviewed.  HENT: Normocephalic, atraumatic. Eyes: EOMI. No discharge.  Respiratory: Effort normal.  GI: He exhibits no distension.  Musculoskeletal: Left lower extremity with edema and tenderness  Neurological: He is alert.  Patient is pleasantly confused.  Oriented x1 Follow simple commands Motor: Right lower extremity: 4+/5 proximal distal Left lower extremity: Hip flexion 2+/5, knee locked in brace, ankle dorsiflexion 4/5  Skin:  Left knee with immobilizer in place.  Dressing C/D/I  Psychiatric: His speech is delayed. He is slowed. Cognition and memory are impaired.   Assessment/Plan: 1. Functional deficits secondary to left knee patellar tendon rupture and meniscal debridement s/p repair which require 3+ hours per day of interdisciplinary therapy in a comprehensive inpatient rehab setting.  Physiatrist is providing close team supervision and 24 hour management of active medical problems listed below.  Physiatrist and rehab team continue to assess barriers to discharge/monitor patient progress toward functional and medical goals  Care Tool:  Bathing  Bathing  activity did not occur: Safety/medical concerns Body parts bathed by patient: Right arm, Left arm, Chest, Abdomen, Face   Body parts bathed by helper: Front perineal area, Buttocks, Right upper leg, Right lower leg Body parts n/a: Left upper leg, Left lower leg(LLE in knee immobilizer)   Bathing assist Assist Level: 2 Helpers     Upper Body Dressing/Undressing Upper body dressing   What is the patient wearing?: Pull over shirt    Upper body assist Assist Level: Moderate Assistance - Patient 50 - 74%    Lower Body Dressing/Undressing Lower body dressing      What is the patient wearing?: Pants, Incontinence brief     Lower body assist Assist for lower body dressing: 2 Helpers     Toileting Toileting Toileting Activity did not occur Landscape architect and hygiene only): Safety/medical concerns  Toileting assist       Transfers Chair/bed transfer  Transfers assist     Chair/bed transfer assist level: 2 Helpers     Locomotion Ambulation   Ambulation assist   Ambulation activity did not occur: Safety/medical concerns          Walk 10 feet activity   Assist  Walk 10 feet activity did not occur: Safety/medical concerns        Walk 50 feet activity   Assist Walk 50 feet with 2 turns activity did not occur: Safety/medical concerns         Walk 150 feet activity   Assist Walk 150 feet activity did not occur: Safety/medical  concerns         Walk 10 feet on uneven surface  activity   Assist Walk 10 feet on uneven surfaces activity did not occur: Safety/medical concerns         Wheelchair     Assist Will patient use wheelchair at discharge?: Yes Type of Wheelchair: Manual    Wheelchair assist level: Moderate Assistance - Patient 50 - 74% Max wheelchair distance: 45f    Wheelchair 50 feet with 2 turns activity    Assist        Assist Level: Moderate Assistance - Patient 50 - 74%   Wheelchair 150 feet activity      Assist Wheelchair 150 feet activity did not occur: Refused          Medical Problem List and Plan: 1.  Decreased functional mobility secondary to acute on chronic left knee pain status post repair of left patellar tendon rupture and scope debridement of meniscal tears 02-2019 and 02/13/2019. Weightbearing as tolerated with full knee extension brace must be on an full extension no knee range of motion. Team conference today please see physician documentation under team conference tab, met with team face-to-face to discuss problems,progress, and goals. Formulized individual treatment plan based on medical history, underlying problem and comorbidities. MinA goals , MinA  Brace at all times in locked 2.  Antithrombotics: -DVT/anticoagulation:  Chronic Coumadin             -antiplatelet therapy: N/A 3. Pain Management: hydrocodone as needed 4. Mood: Aricept 10 mg daily             -antipsychotic agents: lithium 150 mg daily 5. Neuropsych: This patient is not capable of making decisions on his own behalf. 6. Skin/Wound Care:  Routine skin checks 7. Fluids/Electrolytes/Nutrition:  Routine ins and outs  BMP within acceptable range on 4/7 8. Acute on chronic anemia.   Hb 9.6 on 4/7 9. Atrial fibrillation. Cardizem 360 mg daily. Cardiac rate controlled.    Monitor with increased mobility  10. BPH. Pro scar 11. Hyperlipidemia. Lovaza 12. Hyperglycemia  Monitor with increased mobility 13. Leukocytosis  WBCs 11.5 on 4/7   Afebrile  Cont to monitor 14. Hypoalbuminemia  Supplement initiated on 4/7  LOS: 2 days A FACE TO FACE EVALUATION WAS PERFORMED  ACharlett Blake4/06/2019, 9:47 AM

## 2019-02-17 NOTE — Progress Notes (Signed)
ANTICOAGULATION CONSULT NOTE - Follow Up Consult  Pharmacy Consult for Coumadin Indication: atrial fibrillation  No Known Allergies  Patient Measurements: Height: 5\' 10"  (177.8 cm) Weight: 196 lb 10.4 oz (89.2 kg) IBW/kg (Calculated) : 73  Vital Signs: Temp: 98.3 F (36.8 C) (04/08 0512) Temp Source: Oral (04/08 0512) BP: 108/84 (04/08 0512) Pulse Rate: 79 (04/08 0512)  Labs: Recent Labs    02/15/19 0240 02/16/19 0409 02/17/19 0538  HGB 9.3* 9.6*  --   HCT 28.6* 31.2*  --   PLT 465* 515*  --   LABPROT 30.8* 28.1* 25.8*  INR 3.0* 2.7* 2.4*  CREATININE  --  0.95  --     Estimated Creatinine Clearance: 76.7 mL/min (by C-G formula based on SCr of 0.95 mg/dL).  Assessment: Anticoag: Afib on Coumadin. S/p Flagyl (LD 4/2). INR down to 2.4. Hgb 9.6 slowly rising. - Coumadin 5mg  daily exc 2.5mg  on Sun PTA. Admit INR was 6.2. Given vit K 5mg  IV > 1.6.   Goal of Therapy:  INR 2-3 Monitor platelets by anticoagulation protocol: Yes   Plan:  Give Coumadin 2.5 mg PO x 1 tonight Monitor daily INR, CBC, s/s of bleed  Norval Slaven S. Alford Highland, PharmD, BCPS Clinical Staff Pharmacist Eilene Ghazi Stillinger 02/17/2019,11:39 AM

## 2019-02-17 NOTE — Plan of Care (Signed)
  Problem: Consults Goal: RH GENERAL PATIENT EDUCATION Description See Patient Education module for education specifics. Outcome: Progressing   Problem: RH BLADDER ELIMINATION Goal: RH STG MANAGE BLADDER WITH ASSISTANCE Description STG Manage Bladder With min Assistance  Outcome: Progressing   Problem: RH SKIN INTEGRITY Goal: RH STG SKIN FREE OF INFECTION/BREAKDOWN Description Manage with min assist  Outcome: Progressing Goal: RH STG MAINTAIN SKIN INTEGRITY WITH ASSISTANCE Description STG Maintain Skin Integrity With min Assistance.  Outcome: Progressing Goal: RH STG ABLE TO PERFORM INCISION/WOUND CARE W/ASSISTANCE Description STG Able To Perform Incision/Wound Care With min  Assistance.  Outcome: Progressing   Problem: RH SAFETY Goal: RH STG ADHERE TO SAFETY PRECAUTIONS W/ASSISTANCE/DEVICE Description STG Adhere to Safety Precautions With Assistance/Device. Outcome: Progressing   Problem: RH PAIN MANAGEMENT Goal: RH STG PAIN MANAGED AT OR BELOW PT'S PAIN GOAL Description At or below level 4  Outcome: Progressing   Problem: RH KNOWLEDGE DEFICIT GENERAL Goal: RH STG INCREASE KNOWLEDGE OF SELF CARE AFTER HOSPITALIZATION Description Wife will be able to explain management of care at discharge independently using handouts/educational resources provided.  Outcome: Progressing

## 2019-02-17 NOTE — Progress Notes (Signed)
Social Work Patient ID: Russell Dillon, male   DOB: [birth year], 73 y.o.   MRN: [id number] Spoke with wife via telephone to discuss team conference goals min-mod level and need for two week length of stay. She really wants to get him home from here. She feels he may have an issue with his right hip which is his good leg. Wants an x-ray for this leg. Will work on a safe plan for pt and work with wife on this also.

## 2019-02-17 NOTE — Progress Notes (Signed)
Physical Therapy Session Note  Patient Details  Name: Russell Dillon MRN: [id number] Date of Birth: Aug 10, 1946  Today's Date: 02/17/2019 PT Individual Time: 0915-1000 PT Individual Time Calculation (min): 45 min   Short Term Goals: Week 1:  PT Short Term Goal 1 (Week 1): Pt will perform sit<>stand transfers using LRAD with no more than mod assist PT Short Term Goal 2 (Week 1): Pt wil perform bed<>chair transfers with no more than mod assist consistently PT Short Term Goal 3 (Week 1): Pt will ambulate at least 65ft using LRAD with max assist PT Short Term Goal 4 (Week 1): Pt will initiate stair training  Skilled Therapeutic Interventions/Progress Updates:    Pt supine in bed upon PT arrival, agreeable to therapy tx and reports pain in L LE but unable to rate when asked. Pt requesting urinal to use bathroom. Pt continent of bladder. Pt transferred to sitting with min assist for L LE management. Therapist looped LEs through pants total assist. Pt performed sit<>stand from elevated bed this session with RW and max assist, total assist for clothing management to pull pants over hips. Pt performed slideboard transfer this session to the w/c with mod assist. Pt propelled w/c to the gym with CGA x 150 ft, cues for awareness and hand placement. Pt performed slideboard transfers this session w/c<>mat with min assist in each direction, max cues for techniques/hand place. Pt with decreased attention noted throughout session requiring cues throughout. Pt performed x1 sit<>stand from elevated mat with Max assist and RW, flexed posture and only able to maintain x 5 sec. Pt reports "I just can't stand, I don't know about all this." Therapist providing encouragement and education on importance of standing practice. Pt transported back to room and left in w/c with needs in reach and chair alarm set.    Therapy Documentation Precautions:  Precautions Precautions: Fall Precaution Comments: maternity pads used to  prevent [name] from rubbing Required Braces or Orthoses: Other Brace Other Brace: Left knee hinged brace locked in extension at all times. NO KNEE ROM Restrictions Weight Bearing Restrictions: Yes LLE Weight Bearing: Weight bearing as tolerated    Therapy/Group: Individual Therapy  Netta Corrigan, PT, DPT 02/17/2019, 7:49 AM

## 2019-02-17 NOTE — Progress Notes (Signed)
Occupational Therapy Session Note  Patient Details  Name: Russell Dillon MRN: [id number] Date of Birth: [birth year]  Today's Date: 02/17/2019 OT Individual Time: 1230-1331 OT Individual Time Calculation (min): 61 min    Short Term Goals: Week 1:  OT Short Term Goal 1 (Week 1): Pt will complete sit > stand with mod assist to decrease burden of care with LB bathing/dressing OT Short Term Goal 2 (Week 1): Pt will complete LB dressing (excluding foot wear) with mod assist OT Short Term Goal 3 (Week 1): Pt will complete bathing with mod assist OT Short Term Goal 4 (Week 1): Pt will complete toilet transfer with mod assist  Skilled Therapeutic Interventions/Progress Updates:    Session 1:  ( 4235-3614)  Pt completed bathing and dressing supine to sit during session.  Began with pt in the bed in supine with rolling side to side with min assist and max instructional cueing in order to remove LB clothing and brief as well as for completion of washing buttocks.  He was able to wash the front peri area with setup but needed max assist for washing lower legs including unfastening his hinged brace, washing, and then fastening it back.  He donned brief and pants with total assist as well.  Next, he transitioned to sitting with min assist and then worked on [employer] transfer to the wheelchair with mod assist and max instructional cueing.  He completed session with UB bathing and dressing with min assist from the wheelchair.  He accidentally placed his right arm and neck both through the neck hole without awareness and needed assist to correct.  Pt left in wheelchair at end of session with safety belt alarm in place and call button and phone in reach.  Pt with decreased memory throughout session, unaware of reason for hospitalization or that he had had surgery on his right LE.    Session 2:  (1445-1530)  Pt completed wheelchair mobility down to the therapy gym with increased time and supervision.  Once at  the main gym therapist assisted with taking him down the rest of the way to the ortho gym where he worked on [employer] with use of the ergonometer.  He completed 2 sets of 6 mins with resistance on level 8.  Mod instructional cueing needed to maintain RPMs at 19-25.  Resistance decreased to level 2 for the last 2 mins so he could complete the set.  HR checked at conclusion of session with 62 BPM and O2 at 95%.  Pt returned to room with therapist's help and completed sliding board transfer back to the bed to rest with mod assist and mod instructional cueing for hand placement and technique.  Bed alarm in place with call button and phone in reach as well.  Therapy Documentation Precautions:  Precautions Precautions: Fall Precaution Comments: maternity pads used to prevent [name] from rubbing Required Braces or Orthoses: Other Brace Other Brace: Left knee hinged brace locked in extension at all times. NO KNEE ROM Restrictions Weight Bearing Restrictions: No LLE Weight Bearing: Weight bearing as tolerated  Pain: Pain Assessment Pain Scale: Faces Pain Score: 0-No pain Faces Pain Scale: Hurts little more Pain Type: Acute pain Pain Location: Other (Comment)(general discomfort, unable to state/explain location to PT ) Pain Orientation: Other (Comment)(general pain/discomfort ) Pain Descriptors / Indicators: Discomfort Pain Onset: On-going Patients Stated Pain Goal: 0 Pain Intervention(s): Repositioned Multiple Pain Sites: No ADL: See Care Tool Section from some details of ADL  Therapy/Group: Individual Therapy  Grace Haggart,Skylar OTR/L 02/17/2019, 3:12 PM

## 2019-02-17 NOTE — Addendum Note (Signed)
Addendum  created 02/17/19 1506 by Effie Berkshire, MD   Clinical Note Signed, Intraprocedure Blocks edited

## 2019-02-18 ENCOUNTER — Inpatient Hospital Stay (HOSPITAL_COMMUNITY): Payer: Medicare Other | Admitting: Occupational Therapy

## 2019-02-18 ENCOUNTER — Inpatient Hospital Stay (HOSPITAL_COMMUNITY): Payer: Medicare Other | Admitting: Physical Therapy

## 2019-02-18 ENCOUNTER — Inpatient Hospital Stay (HOSPITAL_COMMUNITY): Payer: Self-pay | Admitting: Physical Therapy

## 2019-02-18 LAB — PROTIME-INR
INR: 2.1 — ABNORMAL HIGH (ref 0.8–1.2)
Prothrombin Time: 23.3 seconds — ABNORMAL HIGH (ref 11.4–15.2)

## 2019-02-18 MED ORDER — WARFARIN SODIUM 2.5 MG PO TABS
2.5000 mg | ORAL_TABLET | Freq: Once | ORAL | Status: AC
  Administered 2019-02-18: 2.5 mg via ORAL
  Filled 2019-02-18: qty 1

## 2019-02-18 NOTE — Progress Notes (Signed)
Physical Therapy Session Note  Patient Details  Name: Russell Dillon MRN: [id number] Date of Birth: 1946-07-25  Today's Date: 02/18/2019 PT Individual Time: 1452-1530 PT Individual Time Calculation (min): 38 min   Short Term Goals: Week 1:  PT Short Term Goal 1 (Week 1): Pt will perform sit<>stand transfers using LRAD with no more than mod assist PT Short Term Goal 2 (Week 1): Pt wil perform bed<>chair transfers with no more than mod assist consistently PT Short Term Goal 3 (Week 1): Pt will ambulate at least 58f using LRAD with max assist PT Short Term Goal 4 (Week 1): Pt will initiate stair training  Skilled Therapeutic Interventions/Progress Updates:    [facility] but very visibly fatigued today, agreeable to therapy. [facility]ued working on [facility] and UE endurance and patient able to self-propel WC [id number] with S and VC for straight line navigation, one rest break in between distances; continues to state "I'm beyond wiped out all the way to exhausted". Set up transfer with totalA, patient then able to perform sliding board transfer to bed with min guard and simple verbal cues today, required ModA for sit to supine. Worked on UE exercises (chest presses and horizontal shoulder ABD) in bed then patient requested to stop session due to fatigue. He was positioned to comfort in bed with all needs met and bed alarm active. Continues to require reorientation as to why he is in the hospital.   Therapy Documentation Precautions:  Precautions Precautions: Fall Precaution Comments: maternity pads used to prevent [name] from rubbing Required Braces or Orthoses: Other Brace Other Brace: Left knee hinged brace locked in extension at all times. NO KNEE ROM Restrictions Weight Bearing Restrictions: No LLE Weight Bearing: Weight bearing as tolerated General: PT Amount of Missed Time (min): 7 Minutes PT Missed Treatment Reason: Patient fatigue;Other (Comment)(PT  running late from previous session) Pain: Pain Assessment Pain Scale: Faces Faces Pain Scale: Hurts a little bit Pain Type: Acute pain Pain Location: Other (Comment)(general discomfort ) Pain Orientation: Other (Comment)(general discomfort ) Pain Descriptors / Indicators: Discomfort Pain Onset: On-going Patients Stated Pain Goal: 0 Pain Intervention(s): Repositioned Multiple Pain Sites: No    Therapy/Group: Individual Therapy   KrDeniece ReeT, DPT, CBIS  Supplemental Physical Therapist [facility]  Pager [phone] Office [phone]  02/18/2019, 4:03 PM

## 2019-02-18 NOTE — Progress Notes (Signed)
ANTICOAGULATION CONSULT NOTE - Follow Up Consult  Pharmacy Consult for Coumadin Indication: atrial fibrillation  No Known Allergies  Patient Measurements: Height: 5\' 10"  (177.8 cm) Weight: 196 lb 7.2 oz (89.1 kg) IBW/kg (Calculated) : 73  Vital Signs: Temp: 98.3 F (36.8 C) (04/09 0538) Temp Source: Oral (04/09 0538) BP: 118/66 (04/09 0823) Pulse Rate: 69 (04/09 0538)  Labs: Recent Labs    02/16/19 0409 02/17/19 0538 02/18/19 0524  HGB 9.6*  --   --   HCT 31.2*  --   --   PLT 515*  --   --   LABPROT 28.1* 25.8* 23.3*  INR 2.7* 2.4* 2.1*  CREATININE 0.95  --   --     Estimated Creatinine Clearance: 76.6 mL/min (by C-G formula based on SCr of 0.95 mg/dL).  Assessment: Anticoag: Afib on Coumadin. S/p Flagyl (LD 4/2).   - Coumadin 5mg  daily exc 2.5mg  on Sun PTA. Admit INR was 6.2. Given vit K 5mg  IV > 1.6.   INR down to 2.1. plts last 515 on 4/7, hgb 9.6. No bleeding reported  Goal of Therapy:  INR 2-3 Monitor platelets by anticoagulation protocol: Yes   Plan:  Give Coumadin 2.5 mg PO again tonight Monitor daily INR, CBC, s/s of bleed  Juanell Fairly, PharmD PGY1 Pharmacy Resident 02/18/2019,2:26 PM

## 2019-02-18 NOTE — Progress Notes (Signed)
Occupational Therapy Session Note  Patient Details  Name: ALECZANDER FANDINO MRN: [id number] Date of Birth: [birth year]  Today's Date: 02/18/2019 OT Individual Time: 8182-9937 OT Individual Time Calculation (min): 53 min    Short Term Goals: Week 1:  OT Short Term Goal 1 (Week 1): Pt will complete sit > stand with mod assist to decrease burden of care with LB bathing/dressing OT Short Term Goal 2 (Week 1): Pt will complete LB dressing (excluding foot wear) with mod assist OT Short Term Goal 3 (Week 1): Pt will complete bathing with mod assist OT Short Term Goal 4 (Week 1): Pt will complete toilet transfer with mod assist  Skilled Therapeutic Interventions/Progress Updates:    Pt completed peri washing, donning new brief, and donning new pants in supine to start session, all with total assist from therapist.  He was able to roll side to side with min assist using the rail for support during the tasks.  Min assist with max instructional cueing for sequencing supine to sit EOB.  Pt not oriented to reason for hospitalization but was able to recall that he is at [facility].  Once on the EOB had pt complete 3 sit to stand transitions from elevated surface, all with max facilitation from therapist.  Max instructional cueing needed for hand placement as well.  He demonstrates flexed trunk and head in standing as well as slight right knee flexion.  He was only able to maintain standing for 30-45 seconds before needing to sit back down.  He performed stand pivot transfer to the wheelchair with min assist once standing.  Had him complete sit to stand from the wheelchair as well with max assist X2 before completion of oral hygiene in sitting with supervision.  Pt finished session up in the wheelchair with call button and phone in reach and alarm belt in place.    Therapy Documentation Precautions:  Precautions Precautions: Fall Precaution Comments: maternity pads used to prevent [name] from  rubbing Required Braces or Orthoses: Other Brace Other Brace: Left knee hinged brace locked in extension at all times. NO KNEE ROM Restrictions Weight Bearing Restrictions: No LLE Weight Bearing: Weight bearing as tolerated  Pain: Pain Assessment Pain Scale: Faces Faces Pain Scale: Hurts a little bit Pain Type: Acute pain Pain Location: Hip Pain Orientation: Left Pain Descriptors / Indicators: Grimacing;Discomfort Pain Onset: With Activity Pain Intervention(s): Repositioned ADL: See Care Tool Section for some details of ADL  Therapy/Group: Individual Therapy  Nazaria Riesen,Jerrell OTR/L 02/18/2019, 11:39 AM

## 2019-02-18 NOTE — Progress Notes (Signed)
Physical Therapy Session Note  Patient Details  Name: Russell Dillon MRN: [id number] Date of Birth: [birth year]  Today's Date: 02/18/2019 PT Individual Time: 7062-3762 and 8315-1761 PT Individual Time Calculation (min): 60 min and 11min  Short Term Goals: Week 1:  PT Short Term Goal 1 (Week 1): Pt will perform sit<>stand transfers using LRAD with no more than mod assist PT Short Term Goal 2 (Week 1): Pt wil perform bed<>chair transfers with no more than mod assist consistently PT Short Term Goal 3 (Week 1): Pt will ambulate at least 49ft using LRAD with max assist PT Short Term Goal 4 (Week 1): Pt will initiate stair training  Skilled Therapeutic Interventions/Progress Updates:    Session 1: Pt received seated in w/c and agreeable to therapy session. Pt propelled w/c 194ft with 2 turns using B UEs with supervision and cuing for increased use of L UE to propel straight as well as increased L UE propulsion to turn R. Pt performed R lateral scoot transfer using transfer board w/c to mat table with CGA for steadying and total assist for proper set-up and placement of transfer board for transfer. Therapist educated pt on proper set-up and sequencing of lateral scoot transfer with transfer board with visual demonstration provided. Pt performed L lateral scoot transfer EOM to w/c with CGA for steadying, total assist for transfer board placement and cuing for sequencing of transfer. Performed blocked practice lateral scoot transfer board EOM<>w/c with pt demonstrating significantly impaired motor processing and recall of transfer education continuing to require max cuing for proper transfer set-up and sequencing with CGA for steadying during transfers. Performed sit<>supine with min assist for L LE management to ensure proper alignment and fit of L LE knee extension hinge brace. Performed sit to stand from elevated mat table to RW with max assist for lifting and therapist placing foot under pt's L leg to  monitor weightbearing and indirectly monitor quadriceps muscle activation. Pt tolerated static standing for ~34minute with heavy reliance on B UE support through RW with manual facilitation and cuing for improved upright posture with pt demonstrating R lateral weightshift with minimal weightbearing through L LE. Pt reported minimal increase in L knee pain after standing, but unrated. Pt performed seated R LE exercises 2 sets of 10 repetitions each: long arch quad and ankle DF against orange theraband resistance with verbal cuing for proper technique and visual target to increase ROM of exercise. Pt performed L lateral transfer board scoot to w/c with close supervision for safety. Pt unable to recall how to propel w/c with B UE support demonstrating impaired motor processing requiring max cuing for proper UE hand placement. Pt transported to room in w/c for time management. Pt left seated in w/c with needs in reach and seat belt alarm on.   Session 2: Pt received supine in bed and agreeable to therapy session. Pt performed supine to sit, HOB partially elevated and using bedrails, with mod assist for L LE management and trunk upright with cuing for sequencing of task. Reinforced pror educated on proper w/c set-up for slide board transfer. Performed R lateral scoot slide board transfer with CGA for steadying and total assist for transfer board placement - cuing for sequencing and hand placement during transfer. Pt transported in w/c to gym total assist for energy conservation and time management. Pt performed 2x cone weaving in w/c using B UEs for propulsion with pt continuing to demonstrate impaired motor planning and processing with inability to locate placement of hands to  perform w/c propulsion requiring max cuing (pt initially reaching for breaks and unable to find wheels, increased difficulty noted with L UE compared to R UE) - required supervision during cone weaving with max cuing to avoid obstacles -  demonstrates increased speed and decreased number of errors on 2nd trial. Pt propelled w/c ~180ft using B UEs with primarily supervision including 2 turns, ~62ft of backwards propulsion, and 1 doorway with cuing throughout for sequencing and technique for turns - pt required total assist to navigate doorways. Pt ascended/descended a ramp in w/c using B UEs for propulsion with cuing for technique and min/mod assist for propulsion up the ramp and min assist for controlling descent - max cuing throughout for use of  BUEs to perform task. Pt transported in w/c to day room and performed R LE seated kinetron from w/c at level 20 focusing on R LE strength and endurance - cuing for proper technique. Pt transported back to room in w/c for energy conservation due to pt reported being very tired. Pt able to place w/c in preparation for transfer with min cuing, but unable to recall following steps to set-up for slide board transfer requiring max cuing. Pt performed R lateral scoot transfer w/c to EOB using transfer board with total assist for transfer board placement and supervision for safety during transfer - verbal cuing for sequencing. Pt performed sit to supine with min assist for L LE management. Pt left supine in bed with needs in reach and bed alarm on.   Therapy Documentation Precautions:  Precautions Precautions: Fall Precaution Comments: maternity pads used to prevent [name] from rubbing Required Braces or Orthoses: Other Brace Other Brace: Left knee hinged brace locked in extension at all times. NO KNEE ROM Restrictions Weight Bearing Restrictions: No LLE Weight Bearing: Weight bearing as tolerated  Pain: Pt reports L knee pain that increases minimally with standing but unrated during session.    Therapy/Group: Individual Therapy  Tawana Scale, PT, DPT 02/18/2019, 8:00 AM

## 2019-02-18 NOTE — Progress Notes (Signed)
Physical Therapy Session Note  Patient Details  Name: Russell Dillon MRN: [id number] Date of Birth: 1945/12/17  Today's Date: 02/18/2019 PT Individual Time: 1030-1115 PT Individual Time Calculation (min): 45 min   Short Term Goals: Week 1:  PT Short Term Goal 1 (Week 1): Pt will perform sit<>stand transfers using LRAD with no more than mod assist PT Short Term Goal 2 (Week 1): Pt wil perform bed<>chair transfers with no more than mod assist consistently PT Short Term Goal 3 (Week 1): Pt will ambulate at least 63f using LRAD with max assist PT Short Term Goal 4 (Week 1): Pt will initiate stair training Week 2:     Skilled Therapeutic Interventions/Progress Updates:   Pt received sitting in WC and agreeable to PT. PT instructed pt in WC mobility 2 x 1534fto day room. Min cues for improved use of the LUE to prevent running into obstacles on the L. R LE endurance training on Kinetron 5 x 1 min with min cues for full ROM, improved posture, and attention to task. UBE UE endurance training 5 min forward and 5 min reverse with min cues for improved UE positioning, proper speed, and symmery of movement; level 6>7 with short rest break between bouts. Pt reports 5/10 RPE at end of UBE endurance training. Patient returned to room and [facility] reach and all needs met.           Therapy Documentation Precautions:  Precautions Precautions: Fall Precaution Comments: maternity pads used to prevent [name] from rubbing Required Braces or Orthoses: Other Brace Other Brace: Left knee hinged brace locked in extension at all times. NO KNEE ROM Restrictions Weight Bearing Restrictions: No LLE Weight Bearing: Weight bearing as tolerated    Pain: Pain Assessment Pain Scale: Faces Faces Pain Scale: Hurts a little bit Pain Type: Acute pain Pain Location: Hip Pain Orientation: Left Pain Descriptors / Indicators: Grimacing;Discomfort Pain Onset: With Activity Pain  Intervention(s): Repositioned    Therapy/Group: Individual Therapy  AuLorie Phenix/07/2019, 12:36 PM

## 2019-02-18 NOTE — Progress Notes (Signed)
[place] PHYSICAL MEDICINE & REHABILITATION PROGRESS NOTE  Subjective/Complaints:  Patient denies any knee pain today.  He is up in a wheelchair.  He does not recall his OT but he does remember that he was working with somebody on dressing today ROS: Denies CP, SOB, N/V/D.  Objective: Vital Signs: Blood pressure 118/66, pulse 69, temperature 98.3 F (36.8 C), temperature source Oral, resp. rate 18, height 5\' 10"  (1.778 m), weight 89.1 kg, SpO2 99 %. No results found. Recent Labs    02/16/19 0409  WBC 11.5*  HGB 9.6*  HCT 31.2*  PLT 515*   Recent Labs    02/16/19 0409  NA 138  K 5.0  CL 104  CO2 30  GLUCOSE 121*  BUN 20  CREATININE 0.95  CALCIUM 8.7*    Physical Exam: BP 118/66   Pulse 69   Temp 98.3 F (36.8 C) (Oral)   Resp 18   Ht 5\' 10"  (1.778 m)   Wt 89.1 kg   SpO2 99%   BMI 28.19 kg/m  Constitutional: Well-developed. NAD. Vital signs reviewed.  HENT: Normocephalic, atraumatic. Eyes: EOMI. No discharge.  Respiratory: Effort normal.  GI: He exhibits no distension.  Musculoskeletal: Left lower extremity with edema and tenderness  Neurological: He is alert.  Patient is pleasantly confused.  Oriented x1 Follow simple commands Motor: Right lower extremity: 4+/5 proximal distal Left lower extremity: Hip flexion 2+/5, knee locked in brace, ankle dorsiflexion 4/5  Skin:  Left knee with immobilizer in place.  Dressing C/D/I  Psychiatric: His speech is delayed. He is slowed. Cognition and memory are impaired.   Assessment/Plan: 1. Functional deficits secondary to left knee patellar tendon rupture and meniscal debridement s/p repair which require 3+ hours per day of interdisciplinary therapy in a comprehensive inpatient rehab setting.  Physiatrist is providing close team supervision and 24 hour management of active medical problems listed below.  Physiatrist and rehab team continue to assess barriers to discharge/monitor patient progress toward functional  and medical goals  Care Tool:  Bathing  Bathing activity did not occur: Safety/medical concerns Body parts bathed by patient: Right arm, Left arm, Chest, Abdomen, Front perineal area, Face   Body parts bathed by helper: Buttocks, Right upper leg, Left upper leg, Right lower leg, Left lower leg Body parts n/a: Left upper leg, Left lower leg(LLE in knee immobilizer)   Bathing assist Assist Level: Moderate Assistance - Patient 50 - 74%     Upper Body Dressing/Undressing Upper body dressing   What is the patient wearing?: Pull over shirt    Upper body assist Assist Level: Moderate Assistance - Patient 50 - 74%    Lower Body Dressing/Undressing Lower body dressing      What is the patient wearing?: Incontinence brief     Lower body assist Assist for lower body dressing: Total Assistance - Patient < 25%     Toileting Toileting Toileting Activity did not occur Landscape architect and hygiene only): Safety/medical concerns  Toileting assist Assist for toileting: 2 Helpers     Transfers Chair/bed transfer  Transfers assist     Chair/bed transfer assist level: 2 Helpers     Locomotion Ambulation   Ambulation assist   Ambulation activity did not occur: Safety/medical concerns          Walk 10 feet activity   Assist  Walk 10 feet activity did not occur: Safety/medical concerns        Walk 50 feet activity   Assist Walk 50 feet with  2 turns activity did not occur: Safety/medical concerns         Walk 150 feet activity   Assist Walk 150 feet activity did not occur: Safety/medical concerns         Walk 10 feet on uneven surface  activity   Assist Walk 10 feet on uneven surfaces activity did not occur: Safety/medical concerns         Wheelchair     Assist Will patient use wheelchair at discharge?: Yes Type of Wheelchair: Manual    Wheelchair assist level: Minimal Assistance - Patient > 75% Max wheelchair distance: 100'     Wheelchair 50 feet with 2 turns activity    Assist        Assist Level: Minimal Assistance - Patient > 75%   Wheelchair 150 feet activity     Assist Wheelchair 150 feet activity did not occur: Refused   Assist Level: Contact Guard/Touching assist      Medical Problem List and Plan: 1.  Decreased functional mobility secondary to acute on chronic left knee pain status post repair of left patellar tendon rupture and scope debridement of meniscal tears 02-2019 and 02/13/2019. Weightbearing as tolerated with full knee extension brace must be on an full extension no knee range of motion. CIR PT ,OT MinA goals , MinA  Brace at all times in locked, no weightbearing unless braces on 2.  Antithrombotics: -DVT/anticoagulation:  Chronic Coumadin  INR 3.4 mildly elevated no sign of bleeding             -antiplatelet therapy: N/A 3. Pain Management: hydrocodone as needed 4. Mood: Aricept 10 mg daily             -antipsychotic agents: lithium 150 mg daily 5. Neuropsych: This patient is not capable of making decisions on his own behalf. 6. Skin/Wound Care:  Routine skin checks 7. Fluids/Electrolytes/Nutrition:  Routine ins and outs  BMP within acceptable range on 4/7 8. Acute on chronic anemia.   Hb 9.6 on 4/7 9. Atrial fibrillation. Cardizem 360 mg daily.   Monitor with increased mobility  Vitals:   02/18/19 0538 02/18/19 0823  BP: 101/63 118/66  Pulse: 69   Resp: 18   Temp: 98.3 F (36.8 C)   SpO2: 99%   Heart rate is well controlled 10. BPH. Pro scar 11. Hyperlipidemia. Lovaza 12. Hyperglycemia  Monitor with increased mobility 13. Leukocytosis  WBCs 11.5 on 4/7   Afebrile  Cont to monitor 14. Hypoalbuminemia  Supplement initiated on 4/7  LOS: 3 days A FACE TO FACE EVALUATION WAS PERFORMED  Charlett Blake 02/18/2019, 10:15 AM

## 2019-02-19 ENCOUNTER — Inpatient Hospital Stay (HOSPITAL_COMMUNITY): Payer: Medicare Other | Admitting: Occupational Therapy

## 2019-02-19 ENCOUNTER — Inpatient Hospital Stay (HOSPITAL_COMMUNITY): Payer: Medicare Other

## 2019-02-19 ENCOUNTER — Inpatient Hospital Stay (HOSPITAL_COMMUNITY): Payer: Self-pay | Admitting: Physical Therapy

## 2019-02-19 ENCOUNTER — Inpatient Hospital Stay (HOSPITAL_COMMUNITY): Payer: Medicare Other | Admitting: Physical Therapy

## 2019-02-19 LAB — CBC
HCT: 30.6 % — ABNORMAL LOW (ref 39.0–52.0)
Hemoglobin: 9.2 g/dL — ABNORMAL LOW (ref 13.0–17.0)
MCH: 26.7 pg (ref 26.0–34.0)
MCHC: 30.1 g/dL (ref 30.0–36.0)
MCV: 88.7 fL (ref 80.0–100.0)
Platelets: 534 10*3/uL — ABNORMAL HIGH (ref 150–400)
RBC: 3.45 MIL/uL — ABNORMAL LOW (ref 4.22–5.81)
RDW: 16.3 % — ABNORMAL HIGH (ref 11.5–15.5)
WBC: 8.2 10*3/uL (ref 4.0–10.5)
nRBC: 0 % (ref 0.0–0.2)

## 2019-02-19 LAB — PROTIME-INR
INR: 2 — ABNORMAL HIGH (ref 0.8–1.2)
Prothrombin Time: 22 seconds — ABNORMAL HIGH (ref 11.4–15.2)

## 2019-02-19 MED ORDER — WARFARIN SODIUM 3 MG PO TABS
3.0000 mg | ORAL_TABLET | Freq: Once | ORAL | Status: AC
  Administered 2019-02-19: 18:00:00 3 mg via ORAL
  Filled 2019-02-19: qty 1

## 2019-02-19 NOTE — Progress Notes (Signed)
[place] PHYSICAL MEDICINE & REHABILITATION PROGRESS NOTE  Subjective/Complaints:  No issues overnite , we discussed labwork, no knee pain on Left side  ROS: Denies CP, SOB, N/V/D.  Objective: Vital Signs: Blood pressure (!) 106/58, pulse 71, temperature 97.7 F (36.5 C), temperature source Oral, resp. rate 16, height 5\' 10"  (1.778 m), weight 90.9 kg, SpO2 97 %. No results found. Recent Labs    02/19/19 0449  WBC 8.2  HGB 9.2*  HCT 30.6*  PLT 534*   No results for input(s): NA, K, CL, CO2, GLUCOSE, BUN, CREATININE, CALCIUM in the last 72 hours.  Physical Exam: BP (!) 106/58 (BP Location: Right Arm)   Pulse 71   Temp 97.7 F (36.5 C) (Oral)   Resp 16   Ht 5\' 10"  (1.778 m)   Wt 90.9 kg   SpO2 97%   BMI 28.75 kg/m  Constitutional: Well-developed. NAD. Vital signs reviewed.  HENT: Normocephalic, atraumatic. Eyes: EOMI. No discharge.  Respiratory: Effort normal.  GI: He exhibits no distension.  Musculoskeletal: Left lower extremity with edema and tenderness  Neurological: He is alert.  Patient is pleasantly confused.  Oriented x1 Follow simple commands Motor: Right lower extremity: 4+/5 proximal distal Left lower extremity: Hip flexion 2+/5, knee locked in brace, ankle dorsiflexion 4/5  Skin:  Left knee with immobilizer in place.  Dressing C/D/I  Psychiatric: His speech is delayed. He is slowed. Cognition and memory are impaired.   Assessment/Plan: 1. Functional deficits secondary to left knee patellar tendon rupture and meniscal debridement s/p repair which require 3+ hours per day of interdisciplinary therapy in a comprehensive inpatient rehab setting.  Physiatrist is providing close team supervision and 24 hour management of active medical problems listed below.  Physiatrist and rehab team continue to assess barriers to discharge/monitor patient progress toward functional and medical goals  Care Tool:  Bathing  Bathing activity did not occur:  Safety/medical concerns Body parts bathed by patient: Right arm, Left arm, Chest, Abdomen, Front perineal area, Face   Body parts bathed by helper: Buttocks, Right upper leg, Left upper leg, Right lower leg, Left lower leg Body parts n/a: Left upper leg, Left lower leg(LLE in knee immobilizer)   Bathing assist Assist Level: Moderate Assistance - Patient 50 - 74%     Upper Body Dressing/Undressing Upper body dressing   What is the patient wearing?: Pull over shirt    Upper body assist Assist Level: Moderate Assistance - Patient 50 - 74%    Lower Body Dressing/Undressing Lower body dressing      What is the patient wearing?: Incontinence brief, Pants     Lower body assist Assist for lower body dressing: Total Assistance - Patient < 25%     Toileting Toileting Toileting Activity did not occur Landscape architect and hygiene only): (condom cath.)  Toileting assist Assist for toileting: Set up assist Assistive Device Comment: urinal   Transfers Chair/bed transfer  Transfers assist     Chair/bed transfer assist level: Minimal Assistance - Patient > 75%(lateral scoot with slide board)     Locomotion Ambulation   Ambulation assist   Ambulation activity did not occur: Safety/medical concerns          Walk 10 feet activity   Assist  Walk 10 feet activity did not occur: Safety/medical concerns        Walk 50 feet activity   Assist Walk 50 feet with 2 turns activity did not occur: Safety/medical concerns         Walk  150 feet activity   Assist Walk 150 feet activity did not occur: Safety/medical concerns         Walk 10 feet on uneven surface  activity   Assist Walk 10 feet on uneven surfaces activity did not occur: Safety/medical concerns         Wheelchair     Assist Will patient use wheelchair at discharge?: Yes Type of Wheelchair: Manual    Wheelchair assist level: Supervision/Verbal cueing Max wheelchair distance: 151ft     Wheelchair 50 feet with 2 turns activity    Assist        Assist Level: Supervision/Verbal cueing   Wheelchair 150 feet activity     Assist Wheelchair 150 feet activity did not occur: Refused   Assist Level: Supervision/Verbal cueing      Medical Problem List and Plan: 1.  Decreased functional mobility secondary to acute on chronic left knee pain status post repair of left patellar tendon rupture and scope debridement of meniscal tears 02-2019 and 02/13/2019. Weightbearing as tolerated with full knee extension brace must be on an full extension no knee range of motion. CIR PT ,OT   Brace at all times in locked, no weightbearing unless braces on 2.  Antithrombotics: -DVT/anticoagulation:  Chronic Coumadin  INR 3.4 mildly elevated no sign of bleeding             -antiplatelet therapy: N/A 3. Pain Management: hydrocodone as needed 4. Mood: Aricept 10 mg daily             -antipsychotic agents: lithium 150 mg daily 5. Neuropsych: This patient is not capable of making decisions on his own behalf. 6. Skin/Wound Care:  Routine skin checks 7. Fluids/Electrolytes/Nutrition:  Routine ins and outs  BMP within acceptable range on 4/7 8. Acute on chronic anemia.   Hb 9.6 on 4/7, 9.2 on 4/10 stable 9. Atrial fibrillation. Cardizem 360 mg daily.   Monitor with increased mobility  Vitals:   02/18/19 2039 02/19/19 0500  BP: (!) 113/58 (!) 106/58  Pulse: 81 71  Resp: 16 16  Temp: 97.7 F (36.5 C) 97.7 F (36.5 C)  SpO2: 95% 97%  Heart rate is well controlled 4/10 10. BPH. Pro scar 11. Hyperlipidemia. Lovaza 12. Hyperglycemia  Monitor with increased mobility 13. Leukocytosis resolved  WBCs 11.5 on 4/7 Down to 8.2 on 4/10  Afebrile  Cont to monitor 14. Hypoalbuminemia  Supplement initiated on 4/7  LOS: 4 days A FACE TO FACE EVALUATION WAS PERFORMED  Charlett Blake 02/19/2019, 10:29 AM

## 2019-02-19 NOTE — Plan of Care (Signed)
  Problem: Consults Goal: [facility] GENERAL PATIENT EDUCATION Description: See Patient Education module for education specifics. Outcome: Progressing   

## 2019-02-19 NOTE — Progress Notes (Signed)
Physical Therapy Session Note  Patient Details  Name: Russell Dillon MRN: [id number] Date of Birth: [birth year]  Today's Date: 02/19/2019 PT Individual Time: 0272-5366 PT Individual Time Calculation (min): 49 min   Short Term Goals: Week 1:  PT Short Term Goal 1 (Week 1): Pt will perform sit<>stand transfers using LRAD with no more than mod assist PT Short Term Goal 2 (Week 1): Pt wil perform bed<>chair transfers with no more than mod assist consistently PT Short Term Goal 3 (Week 1): Pt will ambulate at least 1ft using LRAD with max assist PT Short Term Goal 4 (Week 1): Pt will initiate stair training  Skilled Therapeutic Interventions/Progress Updates:   Pt received supine in bed and agreeable to therapy session despite reporting being tired. Pt performed supine to sit, HOB partially elevated and using bedrails, with min assist for L LE management and cuing for sequencing. Pt performed R lateral scoot transfer EOB to w/c using transfer board with total assist for transfer board placement and CGA for lateral scooting with cuing throughout for sequencing of task due to pt's impaired processing. Pt performed B UE w/c propulsion ~157ft with supervision for safety - pt able to place bilateral hands on wheels for propulsion without cuing. Pt performed w/c cone weaving x2 via B UE propulsion - requires max cuing to find wheels with bilateral hands to perform propulsion (initially grabbing breaks and armrests; unable to correct despite increased time) - supervision for w/c propulsion with mod cuing for obstacle navigation. Performed R lateral scoot transfer w/c to EOM with total assist for transfer board placement and min assist for scooting - mod cuing for hand placement and sequencing of scooting. Performed sit to stand from elevated mat (25inches high) using RW with max assist for lifting and balance upon coming to standing - cuing for UE placement on AD - pt unable to achieve full upright stance  maintaining forward flexed posture, weight bearing primarily through R LE despite sustained R knee flexion during stance, and increased weightbearing through  BUEs on RW - pt able to tolerate standing for ~20seconds. Pt reports increased L LE knee pain after standing but unable to provide any additional details regarding intensity or describe the pain - pt requests to return to his room and rest. Performed R lateral scoot tranfser EOM to w/c with total assist for transfer board placement and min assist for scooting hips. Pt performed  BUE w/c propulsion for ~140ft with min assist due to pt demonstrating inconsistent use of R UE for propulsion requiring frequent cuing and assist to maintain forward direction. Pt performed R lateral scoot transfer w/c to EOB with min assist for lifting and pivoting hips. Pt performed sit to supine with min assist for L LE management and cuing for sequencing. Pt left supine in bed with needs in reach and bed alarm on.  Therapy Documentation Precautions:  Precautions Precautions: Fall Precaution Comments: maternity pads used to prevent [name] from rubbing Required Braces or Orthoses: Other Brace Other Brace: Left knee hinged brace locked in extension at all times. NO KNEE ROM Restrictions Weight Bearing Restrictions: No LLE Weight Bearing: Weight bearing as tolerated  Pain: Pt reports increased L LE knee pain after sit<>stand but unable to describe pain or verbalize intensity level - repositioning, emotional support, and rest provided for pain relief - pt deferred application of ice.   Therapy/Group: Individual Therapy  Tawana Scale, PT, DPT 02/19/2019, 4:07 PM

## 2019-02-19 NOTE — Progress Notes (Signed)
ANTICOAGULATION CONSULT NOTE - Follow Up Consult  Pharmacy Consult for Coumadin Indication: atrial fibrillation  No Known Allergies  Patient Measurements: Height: 5\' 10"  (177.8 cm) Weight: 200 lb 6.4 oz (90.9 kg) IBW/kg (Calculated) : 73  Vital Signs: Temp: 97.6 F (36.4 C) (04/10 1356) Temp Source: Oral (04/10 1356) BP: 98/54 (04/10 1356) Pulse Rate: 68 (04/10 1356)  Labs: Recent Labs    02/17/19 0538 02/18/19 0524 02/19/19 0449  HGB  --   --  9.2*  HCT  --   --  30.6*  PLT  --   --  534*  LABPROT 25.8* 23.3* 22.0*  INR 2.4* 2.1* 2.0*    Estimated Creatinine Clearance: 77.4 mL/min (by C-G formula based on SCr of 0.95 mg/dL).  Assessment: Anticoag: Afib on Coumadin. S/p Flagyl (LD 4/2).   - Coumadin 5mg  daily exc 2.5mg  on Sun PTA. Admit INR was 6.2. Given vit K 5mg  IV > 1.6.  Coumadin resumed 02/11/19.   Today 02/19/19 the INR= 2.0. INR is therapeutic but is trending down from 2.7 on 02/16/19 after coumadin held for 3 days (4/4-4/6) due to INR up to 3.5.   Pltc 534 today and Hgb 9.2 low/stable.  No bleeding reported  PTA dose of coumadin was 5mg  daily except 2.5mg  on Sun PTA  Goal of Therapy:  INR 2-3 Monitor platelets by anticoagulation protocol: Yes   Plan:  Give Coumadin 3 mg PO today Monitor daily INR, CBC, s/s of bleed  Nicole Cella, RPh Clinical Pharmacist Please check AMION for all [facility] phone numbers After 10:00 PM, call [place] [phone] 02/19/2019,3:59 PM

## 2019-02-19 NOTE — Progress Notes (Signed)
Occupational Therapy Session Note  Patient Details  Name: Russell Dillon MRN: [id number] Date of Birth: [birth year]  Today's Date: 02/19/2019 OT Individual Time: 1230-1346 OT Individual Time Calculation (min): 76 min    Short Term Goals: Week 1:  OT Short Term Goal 1 (Week 1): Pt will complete sit > stand with mod assist to decrease burden of care with LB bathing/dressing OT Short Term Goal 2 (Week 1): Pt will complete LB dressing (excluding foot wear) with mod assist OT Short Term Goal 3 (Week 1): Pt will complete bathing with mod assist OT Short Term Goal 4 (Week 1): Pt will complete toilet transfer with mod assist  Skilled Therapeutic Interventions/Progress Updates:    Pt completed self feeding with independence and min questioning cueing to start session.  He was not oriented to place or time except for the year "2020".  He also was not aware of why he was in the hospital and that he had needed surgery on his right leg while here.  He was able to complete sit to stand and stand pivot transfers during session after completion of self feeding.  Max instructional cueing for hand placement in order to push up from the mat or wheelchair instead of pulling up on the walker.  He still exhibits standing endurance of 30 seconds to one minute with max facilitation.  Flexed trunk and head in standing with pt unable to achieve full upright position.  He was able to complete stand pivot with the RW and max assist to and from the wheelchair and mat as well as for transfer back to the bed.  Finished session with pt in supine position with call button and phone in reach and safety alarm on.    Therapy Documentation Precautions:  Precautions Precautions: Fall Precaution Comments: maternity pads used to prevent [name] from rubbing Required Braces or Orthoses: Other Brace Other Brace: Left knee hinged brace locked in extension at all times. NO KNEE ROM Restrictions Weight Bearing Restrictions: No LLE  Weight Bearing: Weight bearing as tolerated  Pain: Pain Assessment Pain Scale: Faces Faces Pain Scale: Hurts little more Pain Location: Knee Pain Orientation: Left Pain Descriptors / Indicators: Discomfort Pain Onset: With Activity Pain Intervention(s): Repositioned ADL: See Care Tool Section for some details of ADL  Therapy/Group: Individual Therapy  Kabe Mckoy,Rai OTR/L 02/19/2019, 3:44 PM

## 2019-02-19 NOTE — Progress Notes (Signed)
Occupational Therapy Session Note  Patient Details  Name: Russell Dillon MRN: [id number] Date of Birth: 24-May-1946  Today's Date: 02/19/2019 OT Individual Time: 0930-1000 OT Individual Time Calculation (min): 30 min    Short Term Goals: Week 1:  OT Short Term Goal 1 (Week 1): Pt will complete sit > stand with mod assist to decrease burden of care with LB bathing/dressing OT Short Term Goal 2 (Week 1): Pt will complete LB dressing (excluding foot wear) with mod assist OT Short Term Goal 3 (Week 1): Pt will complete bathing with mod assist OT Short Term Goal 4 (Week 1): Pt will complete toilet transfer with mod assist  Skilled Therapeutic Interventions/Progress Updates:    1:1 Pt in bed in arrived. Pt had finished using the urinal. Pt came to EOB with total multimodal cues to problem solve coming to EOB with mod A. Pt with wet brief. Sit to stands with mod to max A from elevated surface after two attempts he was successful to perform hygiene with total A. Pt actively having a bowel movement during hygiene- when making pt aware of his ongoing bowel movement- pt reports to just get it out before he needs to sit. Transitioned back to bed and then performed stand pivot transfer with max A (min once in standing) over to elevated BSC to complete BM. Pt with continual difficulty with problem solving and following basic directional cues for hand placement and to perform sit to stands. Assisted into standing for hygiene with max A to stand and total for hygiene.total A for LB dressing.  W/c and BSC switched for pt to transition into w/c. Pt left in w/c with safety belt. Pt trying to call his wife from the call bell - not the actual phone and didn't know his phone number.   Therapy Documentation Precautions:  Precautions Precautions: Fall Precaution Comments: maternity pads used to prevent [name] from rubbing Required Braces or Orthoses: Other Brace Other Brace: Left knee hinged brace locked in  extension at all times. NO KNEE ROM Restrictions Weight Bearing Restrictions: Yes LLE Weight Bearing: Weight bearing as tolerated General:   Vital Signs: Therapy Vitals Temp: 97.6 F (36.4 C) Temp Source: Oral Pulse Rate: 68 Resp: 16 BP: (!) 98/54 Patient Position (if appropriate): Lying Oxygen Therapy SpO2: 100 % O2 Device: Room Air Pain:  ongoing discomfort in left knee with activity   Therapy/Group: Individual Therapy  [facility] 02/19/2019, 2:33 PM

## 2019-02-19 NOTE — Progress Notes (Signed)
Physical Therapy Session Note  Patient Details  Name: Russell Dillon MRN: [id number] Date of Birth: 1946/09/03  Today's Date: 02/19/2019 PT Individual Time: 1055-1125 PT Individual Time Calculation (min): 30 min   Short Term Goals: Week 1:  PT Short Term Goal 1 (Week 1): Pt will perform sit<>stand transfers using LRAD with no more than mod assist PT Short Term Goal 2 (Week 1): Pt wil perform bed<>chair transfers with no more than mod assist consistently PT Short Term Goal 3 (Week 1): Pt will ambulate at least 44ft using LRAD with max assist PT Short Term Goal 4 (Week 1): Pt will initiate stair training  Skilled Therapeutic Interventions/Progress Updates:    Session focused on sit <> stand retraining with attempts with RW and in parallel bars and problem solving ways to improve technique. Pt with impaired motor planning and difficulty sequencing ultimately limiting success with sit <> stand and requiring max assist. demonstrational and hand over hand cues needed for hand placement, technique, and correct sequence. Pt able to demonstrate improved clearance using parallel bars than RW from w/c (non elevated surface) with repetition but unable to achieve full upright standing position despite max assist and several repetitions. Pt initially unclear as to why we were working on this, stating "I have no problem standing and walking," but when presented with task, pt unable to complete. PT educated on purpose of activity, overall goals, and ways to address these impairments in mobility. Pt with little to no recall of activities of the day (stated he has already been up walking this morning with the walker) and impaired awareness of having surgery and why he is in the hospital.   Therapy Documentation Precautions:  Precautions Precautions: Fall Precaution Comments: maternity pads used to prevent [name] from rubbing Required Braces or Orthoses: Other Brace Other Brace: Left knee hinged brace  locked in extension at all times. NO KNEE ROM Restrictions Weight Bearing Restrictions: Yes LLE Weight Bearing: Weight bearing as tolerated Pain: Pain Assessment Pain Scale: 0-10 Pain Score: 0-No pain    Therapy/Group: Individual Therapy  Canary Brim Ivory Broad, PT, DPT, CBIS  02/19/2019, 11:38 AM

## 2019-02-20 ENCOUNTER — Inpatient Hospital Stay (HOSPITAL_COMMUNITY): Payer: Medicare Other | Admitting: Occupational Therapy

## 2019-02-20 ENCOUNTER — Inpatient Hospital Stay (HOSPITAL_COMMUNITY): Payer: Medicare Other | Admitting: Physical Therapy

## 2019-02-20 DIAGNOSIS — D72828 Other elevated white blood cell count: Secondary | ICD-10-CM

## 2019-02-20 LAB — CBC
HCT: 28.6 % — ABNORMAL LOW (ref 39.0–52.0)
Hemoglobin: 8.9 g/dL — ABNORMAL LOW (ref 13.0–17.0)
MCH: 28 pg (ref 26.0–34.0)
MCHC: 31.1 g/dL (ref 30.0–36.0)
MCV: 89.9 fL (ref 80.0–100.0)
Platelets: 486 K/uL — ABNORMAL HIGH (ref 150–400)
RBC: 3.18 MIL/uL — ABNORMAL LOW (ref 4.22–5.81)
RDW: 16.5 % — ABNORMAL HIGH (ref 11.5–15.5)
WBC: 7.5 K/uL (ref 4.0–10.5)
nRBC: 0 % (ref 0.0–0.2)

## 2019-02-20 LAB — PROTIME-INR
INR: 2 — ABNORMAL HIGH (ref 0.8–1.2)
Prothrombin Time: 22 seconds — ABNORMAL HIGH (ref 11.4–15.2)

## 2019-02-20 MED ORDER — WARFARIN SODIUM 5 MG PO TABS
5.0000 mg | ORAL_TABLET | Freq: Once | ORAL | Status: AC
  Administered 2019-02-20: 18:00:00 5 mg via ORAL
  Filled 2019-02-20: qty 1

## 2019-02-20 NOTE — Progress Notes (Addendum)
Russell Dillon is a 73 y.o. male June 28, 1946 [id number]  Subjective: No new complaints. No new problems. Slept well. Feeling OK.  Objective: Vital signs in last 24 hours: Temp:  [97.6 F (36.4 C)-97.8 F (36.6 C)] 97.6 F (36.4 C) (04/11 8032) Pulse Rate:  [58-83] 58 (04/11 0638) Resp:  [16] 16 (04/11 0638) BP: (98-102)/(53-58) 101/53 (04/11 0638) SpO2:  [90 %-100 %] 90 % (04/11 1224) Weight:  [94.6 kg] 94.6 kg (04/11 0431) Weight change: 3.7 kg Last BM Date: 02/19/19  Intake/Output from previous day: 04/10 0701 - 04/11 0700 In: -  Out: 800 [Urine:800] Last cbgs: CBG (last 3)  No results for input(s): GLUCAP in the last 72 hours.   Physical Exam General: No apparent distress   HEENT: not dry Lungs: Normal effort. Lungs clear to auscultation, no crackles or wheezes. Cardiovascular: irregular rate and rhythm, no edema Abdomen: S/NT/ND; BS(+) Musculoskeletal:  Unchanged.  Knee brace Neurological: No new neurological deficits Wounds: clean Skin: clear   Mental state: Alert, oriented, cooperative    Lab Results: BMET    Component Value Date/Time   NA 138 02/16/2019 0409   NA 135 07/16/2017 1348   K 5.0 02/16/2019 0409   CL 104 02/16/2019 0409   CO2 30 02/16/2019 0409   GLUCOSE 121 (H) 02/16/2019 0409   BUN 20 02/16/2019 0409   BUN 13 07/16/2017 1348   CREATININE 0.95 02/16/2019 0409   CALCIUM 8.7 (L) 02/16/2019 0409   CALCIUM 8.2 (L) 02/10/2019 0302   GFRNONAA >60 02/16/2019 0409   GFRAA >60 02/16/2019 0409   CBC    Component Value Date/Time   WBC 7.5 02/20/2019 0531   RBC 3.18 (L) 02/20/2019 0531   HGB 8.9 (L) 02/20/2019 0531   HGB 14.0 07/16/2017 1348   HCT 28.6 (L) 02/20/2019 0531   HCT 41.5 07/16/2017 1348   PLT 486 (H) 02/20/2019 0531   PLT 246 07/16/2017 1348   MCV 89.9 02/20/2019 0531   MCV 87 07/16/2017 1348   MCH 28.0 02/20/2019 0531   MCHC 31.1 02/20/2019 0531   RDW 16.5 (H) 02/20/2019 0531   RDW 15.1 07/16/2017 1348   LYMPHSABS 1.7  02/16/2019 0409   MONOABS 0.5 02/16/2019 0409   EOSABS 0.3 02/16/2019 0409   BASOSABS 0.0 02/16/2019 0409    Studies/Results: No results found.  Medications: I have reviewed the patient's current medications.  Assessment/Plan:  1.  Acute on chronic left knee pain.  Status post repair of the left patella tendon rupture and scope debridement of meniscal tear.  Continue with CIR.  Brace 2. DVT prophylaxis with Coumadin 3.  Pain management with hydrocodone as needed 4.  Mood management.  On Aricept.  On lithium 150 mg daily 5.  Anemia.  Monitoring CBC 6.  Atrial fibrillation.  On Cardizem 360 mg daily 7.  BPH.  On Proscar 8.  Hyperlipidemia.  On Lovaza 9.  Leukocytosis.  Resolved 10.  Hypoalbuminemia.  On protein supplements     Length of stay, days: 5  Walker Kehr , MD 02/20/2019, 11:23 AM

## 2019-02-20 NOTE — Progress Notes (Signed)
Occupational Therapy Session Note  Patient Details  Name: Russell Dillon MRN: [id number] Date of Birth: 01/22/1946  Today's Date: 02/20/2019 OT Individual Time: 1000-1045 OT Individual Time Calculation (min): 45 min    Short Term Goals: Week 1:  OT Short Term Goal 1 (Week 1): Pt will complete sit > stand with mod assist to decrease burden of care with LB bathing/dressing OT Short Term Goal 2 (Week 1): Pt will complete LB dressing (excluding foot wear) with mod assist OT Short Term Goal 3 (Week 1): Pt will complete bathing with mod assist OT Short Term Goal 4 (Week 1): Pt will complete toilet transfer with mod assist  Skilled Therapeutic Interventions/Progress Updates:    Patient supine in bed upon arrival.  He is pleasant, unaware of date or circumstances but states that he is tired and his knee hurts.  Urinal use with min A for continent void.  Patient requires max A to don pants and completed CM in standing position.  SSP to and from supine with min A and increased time due to cues for problem solving and sequencing.  Sit to stand with elevated bed CG/min A, sit to stand from w/c min/mod A, SPT with RW CGA and max cues/increased time for safe technique.  Patient completed light UE exercises with cues for upright posture, self propelled w/c from therapy gym to room with max cues for direction.  Patient returned to bed at close of session for safety and rest break.  Bed alarm set and call bell in reach.    Therapy Documentation Precautions:  Precautions Precautions: Fall Precaution Comments: maternity pads used to prevent [name] from rubbing Required Braces or Orthoses: Other Brace Other Brace: Left knee hinged brace locked in extension at all times. NO KNEE ROM Restrictions Weight Bearing Restrictions: No LLE Weight Bearing: Weight bearing as tolerated General:   Vital Signs:  Pain: Pain Assessment Pain Scale: 0-10 Pain Score: 4  Pain Location: Knee Pain Orientation:  Left Pain Descriptors / Indicators: Aching Pain Intervention(s): Repositioned   Other Treatments:     Therapy/Group: Individual Therapy  Carlos Levering 02/20/2019, 12:16 PM

## 2019-02-20 NOTE — Progress Notes (Signed)
Physical Therapy Session Note  Patient Details  Name: Russell Dillon MRN: [id number] Date of Birth: 16-Jun-1946  Today's Date: 02/20/2019 PT Individual Time:1305-1418 and 5102-5852 PT Individual Time Calculation (min): 73 min and 26 min   And Today's Date: 02/20/2019 PT Missed Time: 34 Minutes Missed Time Reason: Patient fatigue  Short Term Goals: Week 1:  PT Short Term Goal 1 (Week 1): Pt will perform sit<>stand transfers using LRAD with no more than mod assist PT Short Term Goal 2 (Week 1): Pt wil perform bed<>chair transfers with no more than mod assist consistently PT Short Term Goal 3 (Week 1): Pt will ambulate at least 30ft using LRAD with max assist PT Short Term Goal 4 (Week 1): Pt will initiate stair training  Skilled Therapeutic Interventions/Progress Updates:    Session 1: Pt received supine in bed and agreeable to therapy session with encouragement from therapist. Pt performed supine to sit, HOB partially elevated and using bedrails, with min/mod assist for L LE management and trunk upright. Pt performed R lateral scoot transfer EOB to w/c using transfer board with max cuing for sequencing and total assist for transfer board placement. Pt demonstrating increased L LE knee pain during this therapy session compared to prior sessions - RN notified and present for medication administration. Pt transported to gym in w/c and performed R lateral scoot transfer w/c<>EOM using transfer board with min assist for scooting and max cuing for sequencing of task. Pt performed sitting balance task on EOM to play horseshoes with focus on reaching outside BOS with close supervision for pt safety and no LOB. Pt noted to have urine soiling front of pants - pt transferred back to w/c as described above. Pt performed ~139ft B UE w/c propulsion with supervision and pt demonstrating decrease time required to locate wheels with hands for propulsion and with less cuing. Upon return to room noticed bed wet and  believe pt spilled urinal on himself. Pt performed R lateral scoot transfer w/c to EOB using transfer board with min assist for scooting and max multimodal cuing for sequencing, total assist for transfer board placement with no recall on placement despite cuing throughout session. Pt performed sit<>stand x2 using RW from elevated EOB with max assist for lifting and max cuing for proper UE hand placement on RW - performed standing LB clothing management with total assist and min assist for balance while standing. Pt performed sit to supine with min assist for L LE management. Pt performed R LE supine exercises of SAQ and hip abduction/adduction 2x10 each with cuing for proper technique and focus on muscle activation and strengthening. Pt left supine in bed with needs in reach and bed alarm on.  Session 2: Pt received asleep supine in bed and easily awakens to name. Pt agreeable to therapy session reporting he feels rested after taking a nap since last session. Pt performed supine to sit with min assist for L LE management and increased time for trunk upright with cuing for sequencing of task. Pt performed seated EOB LB clothing management to don pants with max assist for threading and then performed sit<>stand from elevated EOB to RW with max assist for lifting - pt unable to achieve full upright stance maintaining forward flexed trunk with weight primarily in R LE with knee sustained in flexion despite cuing for improved upright posture and knee extension pt unable to achieve - total assist to pull pants up with min assist for balance. Pt agreeable to perform EOB to w/c transfer  and therapist set-up transfer then upon beginning transfer pt initiated sit to supine and denies further therapy reporting he is exhausted. Pt performed sit to supine with min assist for L LE management and supine scoot towards HOB in trendelenburg using B UEs on bedrails with min assist for scooting. Pt then reports he needs to urinate.  Pt performed supine<>sit with min assist for L LE management.  Pt performed anterior seated scoot on EOB with min assist for L LE management. Pt continent of urine in urinal with supervision for sitting balance. Therapist attempted again to encourage pt to continue working with therapy and he declined, reporting he was absolutely exhausted. Pt returned to supine as described above. Pt left supine in bed with needs in reach and bed alarm on. Missed 34 minutes.   Therapy Documentation Precautions:  Precautions Precautions: Fall Precaution Comments: maternity pads used to prevent [name] from rubbing Required Braces or Orthoses: Other Brace Other Brace: Left knee hinged brace locked in extension at all times. NO KNEE ROM Restrictions Weight Bearing Restrictions: No LLE Weight Bearing: Weight bearing as tolerated Pain: Session 1:  Pain Assessment Pain Scale: Faces Pain Score: 6  Faces Pain Scale: Hurts even more Pain Type: Acute pain Pain Location: Knee Pain Orientation: Left Pain Descriptors / Indicators: Aching Pain Frequency: Intermittent Pain Onset: With Activity Patients Stated Pain Goal: 2 Pain Intervention(s): RN made aware;Other (Comment);Rest(repositioning and RN notified and present for medication administration)   Session 2:  Pt reported L LE knee pain during donning of pants stating it hurt where the pants rubbed alone the patella, unrated - repositioned and rest provided for pain management.   Therapy/Group: Individual Therapy  Tawana Scale, PT, DPT 02/20/2019, 2:10 PM

## 2019-02-20 NOTE — Progress Notes (Signed)
ANTICOAGULATION CONSULT NOTE - Follow Up Consult  Pharmacy Consult for Coumadin Indication: atrial fibrillation  No Known Allergies  Patient Measurements: Height: 5\' 10"  (177.8 cm) Weight: 208 lb 8.9 oz (94.6 kg) IBW/kg (Calculated) : 73  Vital Signs: Temp: 97.6 F (36.4 C) (04/11 0638) Temp Source: Oral (04/11 9122) BP: 101/53 (04/11 5834) Pulse Rate: 58 (04/11 0638)  Labs: Recent Labs    02/18/19 0524 02/19/19 0449 02/20/19 0531  HGB  --  9.2* 8.9*  HCT  --  30.6* 28.6*  PLT  --  534* 486*  LABPROT 23.3* 22.0* 22.0*  INR 2.1* 2.0* 2.0*    Estimated Creatinine Clearance: 78.7 mL/min (by C-G formula based on SCr of 0.95 mg/dL).  Assessment: 72 YOM on Coumadin PTA for Afib.   PTA Coumadin dose: 5mg  daily exc 2.5mg  on Sun. Admit INR was 4.3 (had previously been stable on this PTA dose). Given vit K 5mg  IV > 1.6. Coumadin resumed 02/11/19.   Today INR remains therapeutic but is unchanged at 2.0 due to Coumadin being held for 3 days (4/4-4/6). Hgb remains low/stable and plts are wnl. No bleeding noted.  Goal of Therapy:  INR 2-3 Monitor platelets by anticoagulation protocol: Yes   Plan:  Give Coumadin 5 mg PO today Monitor daily INR, CBC, s/s of bleed  Jackson Latino, PharmD PGY1 Pharmacy Resident Phone [phone] 02/20/2019     9:10 AM

## 2019-02-21 ENCOUNTER — Inpatient Hospital Stay (HOSPITAL_COMMUNITY): Payer: Medicare Other

## 2019-02-21 LAB — CBC
HCT: 28.7 % — ABNORMAL LOW (ref 39.0–52.0)
Hemoglobin: 8.7 g/dL — ABNORMAL LOW (ref 13.0–17.0)
MCH: 27.4 pg (ref 26.0–34.0)
MCHC: 30.3 g/dL (ref 30.0–36.0)
MCV: 90.5 fL (ref 80.0–100.0)
Platelets: 461 10*3/uL — ABNORMAL HIGH (ref 150–400)
RBC: 3.17 MIL/uL — ABNORMAL LOW (ref 4.22–5.81)
RDW: 16.6 % — ABNORMAL HIGH (ref 11.5–15.5)
WBC: 8.1 10*3/uL (ref 4.0–10.5)
nRBC: 0 % (ref 0.0–0.2)

## 2019-02-21 LAB — PROTIME-INR
INR: 2 — ABNORMAL HIGH (ref 0.8–1.2)
Prothrombin Time: 22.7 seconds — ABNORMAL HIGH (ref 11.4–15.2)

## 2019-02-21 MED ORDER — WARFARIN SODIUM 2.5 MG PO TABS
2.5000 mg | ORAL_TABLET | Freq: Once | ORAL | Status: AC
  Administered 2019-02-21: 2.5 mg via ORAL
  Filled 2019-02-21: qty 1

## 2019-02-21 NOTE — Progress Notes (Signed)
ANTICOAGULATION CONSULT NOTE - Follow Up Consult  Pharmacy Consult for Coumadin Indication: atrial fibrillation  No Known Allergies  Patient Measurements: Height: 5\' 10"  (177.8 cm) Weight: 209 lb 14.4 oz (95.2 kg) IBW/kg (Calculated) : 73  Vital Signs: Temp: 98.2 F (36.8 C) (04/12 0438) Temp Source: Oral (04/12 0438) BP: 98/60 (04/12 0438) Pulse Rate: 58 (04/12 0438)  Labs: Recent Labs    02/19/19 0449 02/20/19 0531 02/21/19 0604  HGB 9.2* 8.9* 8.7*  HCT 30.6* 28.6* 28.7*  PLT 534* 486* 461*  LABPROT 22.0* 22.0* 22.7*  INR 2.0* 2.0* 2.0*    Estimated Creatinine Clearance: 79 mL/min (by C-G formula based on SCr of 0.95 mg/dL).  Assessment: 72 YOM on Coumadin PTA for Afib.   PTA Coumadin dose: 5mg  daily exc 2.5mg  on Sun. Admit INR was 4.3 (had previously been stable on this PTA dose). Given vit K 5mg  IV > 1.6. Coumadin resumed 02/11/19.   Today INR remains therapeutic but is unchanged at 2.0 due to Coumadin being held for 3 days (4/4-4/6). Hgb remains low/stable and plts are wnl. No bleeding noted. Will try to resume patient on home dose of Coumadin today.  Goal of Therapy:  INR 2-3 Monitor platelets by anticoagulation protocol: Yes   Plan:  Give Coumadin 2.5 mg PO today Monitor daily INR, s/s of bleed  Jackson Latino, PharmD PGY1 Pharmacy Resident Phone [phone] 02/21/2019     8:12 AM

## 2019-02-21 NOTE — Progress Notes (Signed)
Physical Therapy Session Note  Patient Details  Name: Russell Dillon MRN: [id number] Date of Birth: [birth year]  Today's Date: 02/21/2019 PT Individual Time: 0900-1000 PT Individual Time Calculation (min): 60 min   Short Term Goals: Week 1:  PT Short Term Goal 1 (Week 1): Pt will perform sit<>stand transfers using LRAD with no more than mod assist PT Short Term Goal 2 (Week 1): Pt wil perform bed<>chair transfers with no more than mod assist consistently PT Short Term Goal 3 (Week 1): Pt will ambulate at least 41ft using LRAD with max assist PT Short Term Goal 4 (Week 1): Pt will initiate stair training  Skilled Therapeutic Interventions/Progress Updates:    Pt supine in bed upon PT arrival, agreeable to therapy tx and reports pain in L LE but does not rate. Pt performed rolling in each direction with bedrails and mod assist in order to don pants. Pt transferred to sitting EOB with min assist for L LE management and pt using bedrails with cues for hand placement. Pt performed slideboard transfer to the w/c with min assist, cues for techniques and sequencing. Pt propelled w/c to the gym x 150 ft with supervision and verbal cues for techniques/sequencing, increased time to complete. Pt worked on UE strengthening this session while seated in w/c to performed 2 x 10 chest press, 2 x 10 bicep curls and 2 x 10 shoulder press, all with 3# dowel and cues for techniques/proper form. PT practiced slideboard transfers this session with min assist, w/c<>mat x 1 in each direction with cues for set up and sequencing. Pt propelled back to room and transferred to bed with slideboard and min assist. Sit>supine with min assist for L LE management, pt left supine with bed alarm set and needs in reach.   Therapy Documentation Precautions:  Precautions Precautions: Fall Precaution Comments: maternity pads used to prevent [name] from rubbing Required Braces or Orthoses: Other Brace Other Brace: Left knee hinged  brace locked in extension at all times. NO KNEE ROM Restrictions Weight Bearing Restrictions: Yes LLE Weight Bearing: Weight bearing as tolerated  Therapy/Group: Individual Therapy  Netta Corrigan, PT, DPT 02/21/2019, 7:55 AM

## 2019-02-21 NOTE — Progress Notes (Signed)
Russell Dillon is a 73 y.o. male 1946-06-03 [id number]  Subjective: No new complaints. No new problems. Slept well. Feeling OK.  Objective: Vital signs in last 24 hours: Temp:  [97.7 F (36.5 C)-98.2 F (36.8 C)] 98.2 F (36.8 C) (04/12 0438) Pulse Rate:  [58-79] 58 (04/12 0438) Resp:  [15-19] 18 (04/12 0438) BP: (91-112)/(57-70) 98/60 (04/12 0438) SpO2:  [99 %-100 %] 99 % (04/12 0438) Weight:  [95.2 kg] 95.2 kg (04/12 0438) Weight change: 0.611 kg Last BM Date: 02/19/19  Intake/Output from previous day: 04/11 0701 - 04/12 0700 In: 1000 [P.O.:1000] Out: 1300 [Urine:1300] Last cbgs: CBG (last 3)  No results for input(s): GLUCAP in the last 72 hours.   Physical Exam General: No apparent distress   HEENT: not dry Lungs: Normal effort. Lungs clear to auscultation, no crackles or wheezes. Cardiovascular: irregular rate and rhythm, no edema Abdomen: S/NT/ND; BS(+) Musculoskeletal:  unchanged Neurological: No new neurological deficits Wounds: clean Skin: clear  Aging changes Mental state: Alert,  cooperative    Lab Results: BMET    Component Value Date/Time   NA 138 02/16/2019 0409   NA 135 07/16/2017 1348   K 5.0 02/16/2019 0409   CL 104 02/16/2019 0409   CO2 30 02/16/2019 0409   GLUCOSE 121 (H) 02/16/2019 0409   BUN 20 02/16/2019 0409   BUN 13 07/16/2017 1348   CREATININE 0.95 02/16/2019 0409   CALCIUM 8.7 (L) 02/16/2019 0409   CALCIUM 8.2 (L) 02/10/2019 0302   GFRNONAA >60 02/16/2019 0409   GFRAA >60 02/16/2019 0409   CBC    Component Value Date/Time   WBC 8.1 02/21/2019 0604   RBC 3.17 (L) 02/21/2019 0604   HGB 8.7 (L) 02/21/2019 0604   HGB 14.0 07/16/2017 1348   HCT 28.7 (L) 02/21/2019 0604   HCT 41.5 07/16/2017 1348   PLT 461 (H) 02/21/2019 0604   PLT 246 07/16/2017 1348   MCV 90.5 02/21/2019 0604   MCV 87 07/16/2017 1348   MCH 27.4 02/21/2019 0604   MCHC 30.3 02/21/2019 0604   RDW 16.6 (H) 02/21/2019 0604   RDW 15.1 07/16/2017 1348   LYMPHSABS 1.7 02/16/2019 0409   MONOABS 0.5 02/16/2019 0409   EOSABS 0.3 02/16/2019 0409   BASOSABS 0.0 02/16/2019 0409    Studies/Results: No results found.  Medications: I have reviewed the patient's current medications.  Assessment/Plan:    1.  Acute on chronic left knee pain.  Status post repair of the left patella tendon rupture and scope debridement of meniscal tear.  Continue with CIR.  Brace 2.  DVT prophylaxis with Coumadin 3.  Pain management hydrocodone as needed 4.  Mood management.  On Aricept.  On lithium daily 5.  Anemia.  Monitoring CBC 6.  Atrial fibrillation.  On Cardizem daily 7.  BPH.  On Proscar 8.  Dyslipidemia.  On lovaza 9.  Leukocytosis.  Resolved 10.  Hypoalbuminemia.  On protein supplement     Length of stay, days: 6  Walker Kehr , MD 02/21/2019, 2:38 PM

## 2019-02-22 ENCOUNTER — Inpatient Hospital Stay (HOSPITAL_COMMUNITY): Payer: Self-pay

## 2019-02-22 ENCOUNTER — Inpatient Hospital Stay (HOSPITAL_COMMUNITY): Payer: Medicare Other

## 2019-02-22 ENCOUNTER — Inpatient Hospital Stay (HOSPITAL_COMMUNITY): Payer: Medicare Other | Admitting: Occupational Therapy

## 2019-02-22 LAB — PROTIME-INR
INR: 2.1 — ABNORMAL HIGH (ref 0.8–1.2)
Prothrombin Time: 23.4 seconds — ABNORMAL HIGH (ref 11.4–15.2)

## 2019-02-22 MED ORDER — WARFARIN SODIUM 2.5 MG PO TABS
2.5000 mg | ORAL_TABLET | Freq: Once | ORAL | Status: AC
  Administered 2019-02-22: 18:00:00 2.5 mg via ORAL
  Filled 2019-02-22: qty 1

## 2019-02-22 NOTE — Progress Notes (Signed)
ANTICOAGULATION CONSULT NOTE - Follow Up Consult  Pharmacy Consult for Coumadin Indication: atrial fibrillation  No Known Allergies  Patient Measurements: Height: 5\' 10"  (177.8 cm) Weight: 209 lb 10.6 oz (95.1 kg) IBW/kg (Calculated) : 73  Vital Signs: Temp: 98.3 F (36.8 C) (04/13 0452) Temp Source: Oral (04/13 0452) BP: 110/67 (04/13 0452) Pulse Rate: 76 (04/13 0452)  Labs: Recent Labs    02/20/19 0531 02/21/19 0604 02/22/19 0637  HGB 8.9* 8.7*  --   HCT 28.6* 28.7*  --   PLT 486* 461*  --   LABPROT 22.0* 22.7* 23.4*  INR 2.0* 2.0* 2.1*    Estimated Creatinine Clearance: 78.9 mL/min (by C-G formula based on SCr of 0.95 mg/dL).  Assessment: 72 YOM on Coumadin 5mg  daily exc 2.5mg  on Sun PTA. S/p Flagyl (LD 4/2). Admit INR was 4.3 (had been stable on this dose as outpt). Given vit K 5mg  IV 3/31> 1.6, resumed 4/2 then held again has INR raised quickly. INR now mostly stable at 2.1. Hgb 8.7, plts 461 noted.   Goal of Therapy:  INR 2-3 Monitor platelets by anticoagulation protocol: Yes   Plan:  Give Coumadin 2.5 mg PO today Monitor daily INR, s/s of bleed   Elenor Quinones, PharmD, BCPS, BCIDP Clinical Pharmacist 02/22/2019 9:24 AM

## 2019-02-22 NOTE — Progress Notes (Signed)
[place] PHYSICAL MEDICINE & REHABILITATION PROGRESS NOTE  Subjective/Complaints:  Non ambulatory with total assist LE dressing   ROS: Denies CP, SOB, N/V/D.  Objective: Vital Signs: Blood pressure 110/67, pulse 76, temperature 98.3 F (36.8 C), temperature source Oral, resp. rate 18, height 5\' 10"  (1.778 m), weight 95.1 kg, SpO2 98 %. No results found. Recent Labs    02/20/19 0531 02/21/19 0604  WBC 7.5 8.1  HGB 8.9* 8.7*  HCT 28.6* 28.7*  PLT 486* 461*   No results for input(s): NA, K, CL, CO2, GLUCOSE, BUN, CREATININE, CALCIUM in the last 72 hours.  Physical Exam: BP 110/67 (BP Location: Right Arm)   Pulse 76   Temp 98.3 F (36.8 C) (Oral)   Resp 18   Ht 5\' 10"  (1.778 m)   Wt 95.1 kg   SpO2 98%   BMI 30.08 kg/m  Constitutional: Well-developed. NAD. Vital signs reviewed.  HENT: Normocephalic, atraumatic. Eyes: EOMI. No discharge.  Respiratory: Effort normal.  GI: He exhibits no distension.  Musculoskeletal: Left lower extremity with edema and tenderness  Neurological: He is alert.  Patient is pleasantly confused.  Oriented x1 Follow simple commands Motor: Right lower extremity: 4+/5 proximal distal Left lower extremity: Hip flexion 2+/5, knee locked in brace, ankle dorsiflexion 4/5  Skin:  Left knee with immobilizer in place.  Dressing C/D/I  Psychiatric: His speech is delayed. He is slowed. Cognition and memory are impaired.   Assessment/Plan: 1. Functional deficits secondary to left knee patellar tendon rupture and meniscal debridement s/p repair which require 3+ hours per day of interdisciplinary therapy in a comprehensive inpatient rehab setting.  Physiatrist is providing close team supervision and 24 hour management of active medical problems listed below.  Physiatrist and rehab team continue to assess barriers to discharge/monitor patient progress toward functional and medical goals  Care Tool:  Bathing  Bathing activity did not occur:  Safety/medical concerns Body parts bathed by patient: Right arm, Left arm, Chest, Abdomen, Front perineal area, Face   Body parts bathed by helper: Buttocks, Right upper leg, Left upper leg, Right lower leg, Left lower leg Body parts n/a: Left upper leg, Left lower leg(LLE in knee immobilizer)   Bathing assist Assist Level: Moderate Assistance - Patient 50 - 74%     Upper Body Dressing/Undressing Upper body dressing   What is the patient wearing?: Pull over shirt    Upper body assist Assist Level: Maximal Assistance - Patient 25 - 49%    Lower Body Dressing/Undressing Lower body dressing      What is the patient wearing?: Incontinence brief, Pants     Lower body assist Assist for lower body dressing: Total Assistance - Patient < 25%     Toileting Toileting Toileting Activity did not occur Landscape architect and hygiene only): (condom cath.)  Toileting assist Assist for toileting: Total Assistance - Patient < 25% Assistive Device Comment: urinal   Transfers Chair/bed transfer  Transfers assist     Chair/bed transfer assist level: Moderate Assistance - Patient 50 - 74%     Locomotion Ambulation   Ambulation assist   Ambulation activity did not occur: Safety/medical concerns          Walk 10 feet activity   Assist  Walk 10 feet activity did not occur: Safety/medical concerns        Walk 50 feet activity   Assist Walk 50 feet with 2 turns activity did not occur: Safety/medical concerns         Walk 150  feet activity   Assist Walk 150 feet activity did not occur: Safety/medical concerns         Walk 10 feet on uneven surface  activity   Assist Walk 10 feet on uneven surfaces activity did not occur: Safety/medical concerns         Wheelchair     Assist Will patient use wheelchair at discharge?: Yes Type of Wheelchair: Manual    Wheelchair assist level: Supervision/Verbal cueing Max wheelchair distance: 150 ft     Wheelchair 50 feet with 2 turns activity    Assist        Assist Level: Supervision/Verbal cueing   Wheelchair 150 feet activity     Assist Wheelchair 150 feet activity did not occur: Refused   Assist Level: Supervision/Verbal cueing      Medical Problem List and Plan: 1.  Decreased functional mobility secondary to acute on chronic left knee pain status post repair of left patellar tendon rupture and scope debridement of meniscal tears 02-2019 and 02/13/2019. Weightbearing as tolerated with full knee extension brace must be on an full extension no knee range of motion. CIR PT ,OT   Brace at all times in locked, no weightbearing unless braces on 2.  Antithrombotics: -DVT/anticoagulation:  Chronic Coumadin  INR 2.0              -antiplatelet therapy: N/A 3. Pain Management: hydrocodone as needed 4. Mood: Aricept 10 mg daily             -antipsychotic agents: lithium 150 mg daily 5. Neuropsych: This patient is not capable of making decisions on his own behalf. 6. Skin/Wound Care:  Routine skin checks 7. Fluids/Electrolytes/Nutrition:  Routine ins and outs  BMP within acceptable range on 4/7 8. Acute on chronic anemia.   Hb 9.6 on 4/7, 9.2 on 4/10 stable, 8.7 on 4/12, trending down but INR has been supratherapeutic check stool guaic 9. Atrial fibrillation. Cardizem 360 mg daily.   Monitor with increased mobility  Vitals:   02/21/19 1942 02/22/19 0452  BP: 100/66 110/67  Pulse: 82 76  Resp: 18 18  Temp: 98 F (36.7 C) 98.3 F (36.8 C)  SpO2: 97% 98%  Heart rate is well controlled 4/13 10. BPH. Pro scar 11. Hyperlipidemia. Lovaza 12. Hyperglycemia  Monitor with increased mobility 13. Leukocytosis resolved no need to monitor  14. Hypoalbuminemia  Supplement initiated on 4/7  LOS: 7 days A FACE TO FACE EVALUATION WAS PERFORMED  Charlett Blake 02/22/2019, 9:09 AM

## 2019-02-22 NOTE — Progress Notes (Signed)
Occupational Therapy Session Note  Patient Details  Name: Russell Dillon MRN: [id number] Date of Birth: 03/29/1946  Today's Date: 02/22/2019 OT Individual Time: 1354-1451 OT Individual Time Calculation (min): 57 min 18 minutes missed   Short Term Goals: Week 1:  OT Short Term Goal 1 (Week 1): Pt will complete sit > stand with mod assist to decrease burden of care with LB bathing/dressing OT Short Term Goal 2 (Week 1): Pt will complete LB dressing (excluding foot wear) with mod assist OT Short Term Goal 3 (Week 1): Pt will complete bathing with mod assist OT Short Term Goal 4 (Week 1): Pt will complete toilet transfer with mod assist  Skilled Therapeutic Interventions/Progress Updates:    Pt greeted in bed, wanting to eat his salad. Step by step cues and Min A for supine<sit. While EOB, he required vcs for motor planning his setup (placed his salad on the bed vs table and started eating that way with food spilling). Mod A for returning to bed after, with noted urine-soaked chuck pad and pants. While OT was doffing pants over L LE, pt began hollering out that slight touch was painful. 2nd person arrived to assist, and he would not allow staff to doff pants due to heightened pain/sensitivity. He was agreeable to allow staff cut off pants. Pt often called out in pain when only pants were touched vs LE, required calming cues throughout. Mod A for perihygiene and brief change afterwards. Pt able to assist with rolling while a pillow was placed in between legs, and also during frontal perihygiene. He boosted himself up in bed when it was placed in trendelenburg position. Pt was then left visibly calmer, with all needs within reach and bed alarm set. 18 minutes missed due to fatigue and wanting to return to bed.   Therapy Documentation Precautions:  Precautions Precautions: Fall Precaution Comments: maternity pads used to prevent [name] from rubbing Required Braces or Orthoses: Other Brace Other  Brace: Left knee hinged brace locked in extension at all times. NO KNEE ROM Restrictions Weight Bearing Restrictions: Yes LLE Weight Bearing: Weight bearing as tolerated Pain: Pt reported having no pain at rest, just when L LE was touched    ADL: ADL Upper Body Bathing: Supervision/safety, Minimal cueing Where Assessed-Upper Body Bathing: Chair Lower Body Bathing: Dependent Where Assessed-Lower Body Bathing: Bed level Upper Body Dressing: Moderate assistance Where Assessed-Upper Body Dressing: Chair Lower Body Dressing: Dependent Where Assessed-Lower Body Dressing: Bed level Toileting: Dependent Where Assessed-Toileting: Bed level Toilet Transfer: Maximal assistance Toilet Transfer Method: (lateral scoot) Toilet Transfer Equipment: Drop arm bedside commode      Therapy/Group: Individual Therapy  Russell Dillon A Russell Dillon 02/22/2019, 3:01 PM

## 2019-02-22 NOTE — Progress Notes (Signed)
Physical Therapy Session Note  Patient Details  Name: Russell Dillon MRN: [id number] Date of Birth: Nov 24, 1945  Today's Date: 02/22/2019 PT Individual Time: 0915-1000 and  1115-1200 PT Individual Time Calculation (min): 45 min and 45 min   Short Term Goals: Week 1:  PT Short Term Goal 1 (Week 1): Pt will perform sit<>stand transfers using LRAD with no more than mod assist PT Short Term Goal 2 (Week 1): Pt wil perform bed<>chair transfers with no more than mod assist consistently PT Short Term Goal 3 (Week 1): Pt will ambulate at least 65ft using LRAD with max assist PT Short Term Goal 4 (Week 1): Pt will initiate stair training  Skilled Therapeutic Interventions/Progress Updates:   Session 1:  Pt supine in bed upon PT arrival, agreeable to therapy tx and reports pain in L LE but is unable to rate. Pt performed rolling in both directions with mod assist while therapist assisted with lower body dressing total assist. Pt transferred to sitting EOB with min assist for L LE management. Pt performed min assist slideboard transfer to w/c, cues for techniques and sequencing. Pt propelled w/c to the gym with supervision x 150 ft using B UEs, therapist assisting with w/c parts management. Slideboard transfer to mat min assist. Pt performed strengthening exercises while seated edge of mat, 2 x 10 each: bicep curls 4#, R LE LAQ, shoulder flexion without weight and shoulder abduction without weight. Pt performed x 2 sit<>stands from elevated mat this session with RW and max assist, only able to maintain standing 5-8 sec. Pt transferred back to w/c min assist with slideboard and transported back to room. Slideboard transfer to bed and left supine with needs in reach.   Session 2: Pt supine in bed upon PT arrival, agreeable to therapy tx and reports pain in L LE but is unable to rate. Pt transferred to sitting EOB with min assist for L LE management. Pt performed min assist slideboard transfer to w/c, cues  for techniques and sequencing. Pt propelled w/c to the gym with supervision x 150 ft using B UEs, therapist assisting with w/c parts management. Seated in w/c pt performed x 20 ankle pumps with L LE, performed x 20 LAQ with R LE and x 20 hip flexion marches with R LE, cues for techniques.  Pt performed min assist slideboard transfer w/c<>mat this session, cues for techniques and sequencing. Pt performed sit<>stand from elevated mat with RW and max assist this session, unable to come fully upright and sits back down. Pt reports "I just can't do this, I don't want to do this." Pt transported to [facility] gym and used UE arm bike x 5 minutes for UE endurance and strengthening on resistance level 4.5. Pt requesting to go back to room and rest, reports "I don't want to do anymore therapy right now." Pt transported back to room in w/c and performed slideboard transfer back to bed min assist, Sit>supine with min assist and cues for techniques. Pt left supine with needs in reach and bed alarm set.  Pt missed 45 minutes of skilled therapy tx secondary to fatigue and pt refusal.   Therapy Documentation Precautions:  Precautions Precautions: Fall Precaution Comments: maternity pads used to prevent [name] from rubbing Required Braces or Orthoses: Other Brace Other Brace: Left knee hinged brace locked in extension at all times. NO KNEE ROM Restrictions Weight Bearing Restrictions: Yes LLE Weight Bearing: Weight bearing as tolerated   Therapy/Group: Individual Therapy  Netta Corrigan, PT, DPT 02/22/2019,  7:46 AM

## 2019-02-23 ENCOUNTER — Inpatient Hospital Stay (HOSPITAL_COMMUNITY): Payer: Medicare Other | Admitting: Occupational Therapy

## 2019-02-23 ENCOUNTER — Inpatient Hospital Stay (HOSPITAL_COMMUNITY): Payer: Self-pay | Admitting: Physical Therapy

## 2019-02-23 ENCOUNTER — Inpatient Hospital Stay (HOSPITAL_COMMUNITY): Payer: Medicare Other | Admitting: Physical Therapy

## 2019-02-23 ENCOUNTER — Inpatient Hospital Stay (HOSPITAL_COMMUNITY): Payer: Self-pay

## 2019-02-23 LAB — PROTIME-INR
INR: 2.1 — ABNORMAL HIGH (ref 0.8–1.2)
Prothrombin Time: 22.9 seconds — ABNORMAL HIGH (ref 11.4–15.2)

## 2019-02-23 MED ORDER — WARFARIN SODIUM 5 MG PO TABS
5.0000 mg | ORAL_TABLET | Freq: Once | ORAL | Status: AC
  Administered 2019-02-23: 5 mg via ORAL
  Filled 2019-02-23: qty 1

## 2019-02-23 NOTE — Progress Notes (Signed)
[place] PHYSICAL MEDICINE & REHABILITATION PROGRESS NOTE  Subjective/Complaints:  No issues overnite   ROS: Denies CP, SOB, N/V/D.  Objective: Vital Signs: Blood pressure 120/76, pulse 72, temperature 97.7 F (36.5 C), temperature source Oral, resp. rate 18, height 5\' 10"  (1.778 m), weight 95.2 kg, SpO2 100 %. No results found. Recent Labs    02/21/19 0604  WBC 8.1  HGB 8.7*  HCT 28.7*  PLT 461*   No results for input(s): NA, K, CL, CO2, GLUCOSE, BUN, CREATININE, CALCIUM in the last 72 hours.  Physical Exam: BP 120/76 (BP Location: Right Arm)   Pulse 72   Temp 97.7 F (36.5 C) (Oral)   Resp 18   Ht 5\' 10"  (1.778 m)   Wt 95.2 kg   SpO2 100%   BMI 30.12 kg/m  Constitutional: Well-developed. NAD. Vital signs reviewed.  HENT: Normocephalic, atraumatic. Eyes: EOMI. No discharge.  Respiratory: Effort normal.  GI: He exhibits no distension.  Cor:  Irreg, irreg Musculoskeletal: Left lower extremity with edema and tenderness  Neurological: He is alert.  Patient is pleasantly confused.  Oriented x1 Follow simple commands Motor: Right lower extremity: 4+/5 proximal distal Left lower extremity: Hip flexion 2+/5, knee locked in brace, ankle dorsiflexion 4/5  Skin:  Left knee with immobilizer in place.  Dressing C/D/I  Psychiatric: His speech is delayed. He is slowed. Cognition and memory are impaired.   Assessment/Plan: 1. Functional deficits secondary to left knee patellar tendon rupture and meniscal debridement s/p repair which require 3+ hours per day of interdisciplinary therapy in a comprehensive inpatient rehab setting.  Physiatrist is providing close team supervision and 24 hour management of active medical problems listed below.  Physiatrist and rehab team continue to assess barriers to discharge/monitor patient progress toward functional and medical goals  Care Tool:  Bathing  Bathing activity did not occur: Safety/medical concerns Body parts bathed by  patient: Right arm, Left arm, Chest, Abdomen, Front perineal area, Face   Body parts bathed by helper: Buttocks, Right upper leg, Left upper leg, Right lower leg, Left lower leg Body parts n/a: Left upper leg, Left lower leg(LLE in knee immobilizer)   Bathing assist Assist Level: Moderate Assistance - Patient 50 - 74%     Upper Body Dressing/Undressing Upper body dressing   What is the patient wearing?: Pull over shirt    Upper body assist Assist Level: Maximal Assistance - Patient 25 - 49%    Lower Body Dressing/Undressing Lower body dressing      What is the patient wearing?: Incontinence brief, Pants     Lower body assist Assist for lower body dressing: Total Assistance - Patient < 25%     Toileting Toileting Toileting Activity did not occur Landscape architect and hygiene only): (condom cath.)  Toileting assist Assist for toileting: Total Assistance - Patient < 25% Assistive Device Comment: urinal   Transfers Chair/bed transfer  Transfers assist     Chair/bed transfer assist level: Minimal Assistance - Patient > 75%     Locomotion Ambulation   Ambulation assist   Ambulation activity did not occur: Safety/medical concerns          Walk 10 feet activity   Assist  Walk 10 feet activity did not occur: Safety/medical concerns        Walk 50 feet activity   Assist Walk 50 feet with 2 turns activity did not occur: Safety/medical concerns         Walk 150 feet activity   Assist Walk 150  feet activity did not occur: Safety/medical concerns         Walk 10 feet on uneven surface  activity   Assist Walk 10 feet on uneven surfaces activity did not occur: Safety/medical concerns         Wheelchair     Assist Will patient use wheelchair at discharge?: Yes Type of Wheelchair: Manual    Wheelchair assist level: Supervision/Verbal cueing Max wheelchair distance: 150 ft    Wheelchair 50 feet with 2 turns  activity    Assist        Assist Level: Supervision/Verbal cueing   Wheelchair 150 feet activity     Assist Wheelchair 150 feet activity did not occur: Refused   Assist Level: Supervision/Verbal cueing      Medical Problem List and Plan: 1.  Decreased functional mobility secondary to acute on chronic left knee pain status post repair of left patellar tendon rupture and scope debridement of meniscal tears 02-2019 and 02/13/2019. Weightbearing as tolerated with full knee extension brace must be on an full extension no knee range of motion. CIR PT ,OT Team conf in am   Brace at all times in locked, no weightbearing unless braces on 2.  Antithrombotics: -DVT/anticoagulation:  Chronic Coumadin  INR 2.1             -antiplatelet therapy: N/A 3. Pain Management: hydrocodone as needed 4. Mood:             -antipsychotic agents: lithium 150 mg daily 5. Neuropsych: This patient is not capable of making decisions on his own behalf. 6. Skin/Wound Care:  Routine skin checks 7. Fluids/Electrolytes/Nutrition:  Routine ins and outs  BMP within acceptable range on 4/7 8. Acute on chronic anemia.   Hb 9.6 on 4/7, 9.2 on 4/10 stable, 8.7 on 4/12, trending down but INR has been supratherapeutic check stool guaic 9. Atrial fibrillation. Cardizem 360 mg daily.   Monitor with increased mobility  Vitals:   02/22/19 2001 02/23/19 0512  BP: 103/63 120/76  Pulse: 74 72  Resp: 19 18  Temp: 98.7 F (37.1 C) 97.7 F (36.5 C)  SpO2: 99% 100%  Heart rate is well controlled 4/14 10. BPH. Pro scar 11. Hyperlipidemia. Lovaza 12. Hyperglycemia  Monitor with increased mobility 13. Leukocytosis resolved no need to monitor  14. Hypoalbuminemia  Supplement initiated on 4/7 15.  Hx dementia on Aricept- has difficulty following commands, , needs cueing to perform MMT correctly, this is likely impacting therapy LOS: 8 days A FACE TO FACE EVALUATION WAS PERFORMED  Charlett Blake 02/23/2019,  8:26 AM

## 2019-02-23 NOTE — Progress Notes (Signed)
Physical Therapy Session Note  Patient Details  Name: Russell Dillon MRN: [id number] Date of Birth: [birth year]  Today's Date: 02/23/2019 PT Individual Time: 0901-0930 and 2202-5427 PT Individual Time Calculation (min): 29 min and 57min  Short Term Goals: Week 1:  PT Short Term Goal 1 (Week 1): Pt will perform sit<>stand transfers using LRAD with no more than mod assist PT Short Term Goal 2 (Week 1): Pt wil perform bed<>chair transfers with no more than mod assist consistently PT Short Term Goal 3 (Week 1): Pt will ambulate at least 42ft using LRAD with max assist PT Short Term Goal 4 (Week 1): Pt will initiate stair training  Skilled Therapeutic Interventions/Progress Updates:    Session 1: Pt received supine in bed and agreeable to therapy session. Pt performed supine to sit with min assist for L LE management. Pt requesting to urinate in urinal sitting on EOB with supervision for sitting balance safety - pt continent of urine. Pt performed sitting EOB LB dressing with max assist for threading legs and to get pants over hips. Pt performed R lateral scoot transfer EOB to w/c using transfer board with max cuing for set-up and sequencing of transfer and close supervision for safety during transfer. Pt performed ~253ft of w/c propulsion using BUEs, 1 resting break, with supervision and cuing for increased efficiency with propulsion with minimal carryover. Pt left seated in w/c with needs in reach, pt reoriented to use of call bell, and seat belt alarm on.     Session 2: Pt received supine in bed and agreeable to therapy session. Pt performed supine to sit, HOB partially elevated and using bedrails, with min assist for L LE management and cuing for sequencing of task. Pt performed R lateral scoot transfer EOB to w/c using transfer board with max cuing for sequencing, total assist for transfer board placement, and CGA for steadying/safety during transfer. Pt performed B UE w/c propulsion ~138ft from  room to dayroom with supervision and min cuing for obstacle navigation in hallway. Pt performed seated from w/c R LE kinteron focusing on R LE strengthening and activity tolerance set at resistance of 20cm/sec for 2 sets of 44minutes with cuing for technique. Pt performed B UE w/c propulsion ~169ft to main gym with B UE and supervision. Pt performed R lateral scoot transfer w/c to EOM using transfer board with max cuing for proper set-up of transfer with pt demonstrating no recall from prior sessions; max assist for transfer board placement and close supervision during transfer. Pt performed 2 sets of 10 of the following seated B UE strengthening exercises against level 1 theraband resistance: B UE external rotation , B UE shoulder abduction with elbows extended focusing on middle trap - mod to max cuing throughout for proper technique with visual demonstration provided. Pt performed sit to stand x1 from elevated mat table to RW with max assist for lifting - pt unable to achieve full upright maintaining forward flexed trunk and tolerating standing for ~10-15seconds prior to stating he can't do it any longer and returning to sitting on EOM. Pt performed L lateral scoot transfer EOM to w/c with max assist for transfer board placement and close supervision during transfer. Pt transported in w/c to room for time management and energy conservation. Pt performed R lateral scoot transfer w/c to EOB using transfer board with max assist for transfer board placement and close supervision for safety during transfer. Performed sit to supine with min assist for L LE management and pt left supine  in bed with needs in reach and bed alarm on.   Therapy Documentation Precautions:  Precautions Precautions: Fall Precaution Comments: maternity pads used to prevent [name] from rubbing Required Braces or Orthoses: Other Brace Other Brace: Left knee hinged brace locked in extension at all times. NO KNEE ROM Restrictions Weight  Bearing Restrictions: Yes LLE Weight Bearing: Weight bearing as tolerated  Pain: Session 1: Pain Assessment Pain Scale: Faces Faces Pain Scale: Hurts a little bit Pain Location: Knee Pain Orientation: Left Pain Intervention(s): Rest;Other (Comment)(repositioned and therapy to tolerance) Session 2:  Reports "some" pain "all over my body" unable to clarify further despite questioning.  Therapy/Group: Individual Therapy  Tawana Scale, PT, DPT 02/23/2019, 7:50 AM

## 2019-02-23 NOTE — Progress Notes (Signed)
Occupational Therapy Session Note  Patient Details  Name: Russell Dillon MRN: [id number] Date of Birth: 09-08-1946  Today's Date: 02/23/2019 OT Individual Time: 2035-5974 OT Individual Time Calculation (min): 63 min    Short Term Goals: Week 1:  OT Short Term Goal 1 (Week 1): Pt will complete sit > stand with mod assist to decrease burden of care with LB bathing/dressing OT Short Term Goal 2 (Week 1): Pt will complete LB dressing (excluding foot wear) with mod assist OT Short Term Goal 3 (Week 1): Pt will complete bathing with mod assist OT Short Term Goal 4 (Week 1): Pt will complete toilet transfer with mod assist  Skilled Therapeutic Interventions/Progress Updates:    Pt completed bathing and dressing during session sit to supine.  He was able to transfer to the EOB with min assist for LLE support.  He then completed UB bathing with setup and mod instructional cueing for thoroughness.  Pt still no oriented to place or reason for hospitalization.  He began integration of the reacher to remove his gripper socks and pants with max instructional cueing and overall mod assist.  Max assist for sit to stand from the elevated EOB, with pt exhibiting a flexed trunk and head.  Standing endurance less than 45 seconds for this interval.  Therapist removed left hinged brace for bathing.  Pt with increased tenderness at the anterior shin with light touch.  Had pt transition to supine after assisting him with washing the LLE, in order to re-donn brace.  Total assist to complete as well as total assist for washing buttocks, donning new brief, and pants, and donning  gripper socks from supine position rolling.  Mod demonstrational cueing with mod assist needed for rolling side to side in order to complete LB dressing and bathing.  Finished session with pt remaining in the bed with bed alarm in place and call button and phone in reach.  Pt with significant memory impairment noted.    Therapy  Documentation Precautions:  Precautions Precautions: Fall Precaution Comments: maternity pads used to prevent [name] from rubbing Required Braces or Orthoses: Other Brace Other Brace: Left knee hinged brace locked in extension at all times. NO KNEE ROM Restrictions Weight Bearing Restrictions: No LLE Weight Bearing: Weight bearing as tolerated  Pain: Pain Assessment Pain Scale: Faces Faces Pain Scale: Hurts little more Pain Type: Acute pain Pain Location: Knee Pain Orientation: Left Pain Descriptors / Indicators: Aching Pain Onset: With Activity Pain Intervention(s): Repositioned ADL: See Care Tool Section for some details of ADL  Therapy/Group: Individual Therapy  Kelseigh Diver,Yordan OTR/L 02/23/2019, 4:08 PM

## 2019-02-23 NOTE — Progress Notes (Signed)
Physical Therapy Session Note  Patient Details  Name: Russell Dillon MRN: [id number] Date of Birth: May 09, 1946  Today's Date: 02/23/2019 PT Individual Time: 1025-1135 PT Individual Time Calculation (min): 70 min   Short Term Goals: Week 1:  PT Short Term Goal 1 (Week 1): Pt will perform sit<>stand transfers using LRAD with no more than mod assist PT Short Term Goal 2 (Week 1): Pt wil perform bed<>chair transfers with no more than mod assist consistently PT Short Term Goal 3 (Week 1): Pt will ambulate at least 55ft using LRAD with max assist PT Short Term Goal 4 (Week 1): Pt will initiate stair training  Skilled Therapeutic Interventions/Progress Updates:    Pt seated in w/c upon PT arrival, agreeable to therapy tx and reports pain in L LE but unable to rate. Pt propelled w/c to the gym with supervision x 150 ft, verbal cues for directions and sequencing. Session focused on set up of slideboard transfers and unlevel slideboard transfers with cues for sequencing/techniques. Pt requiring max cues for w/c parts management and transferboard set up. Pt performed x 1 level slideboard transfer in each direction with min guard assist, pt performed x 3 unlevel slideboard transfers w/c<>mat in each direction with min assist (mat height 22 inches, 23 inches and 25 inches for last trial). Seated edge of mat pt performed chest press 2 x 10 and bicep curls 2 x 10 with 1# dowel. Pt transported to the ortho gym and used UE arm bike this session for strengthening and endurance x 5 minutes on resistance level 4.5. Pt propelled w/c back to room and transferred to bed with CGA/set up assist. Pt transferred to supine with min assist and left with needs in reach and bed alarm set. Therapist spoke to pt's wife on the phone this session providing education and updates on pt's current level of function, expected goals and prognosis. Discussed home modifications that will need to be made prior to d/c home, wife has a ramp  for back door entrance (with 2 steps) and wife also plans on setting up w/c accessible transportation home.   Therapy Documentation Precautions:  Precautions Precautions: Fall Precaution Comments: maternity pads used to prevent [name] from rubbing Required Braces or Orthoses: Other Brace Other Brace: Left knee hinged brace locked in extension at all times. NO KNEE ROM Restrictions Weight Bearing Restrictions: Yes LLE Weight Bearing: Weight bearing as tolerated    Therapy/Group: Individual Therapy  Netta Corrigan, PT, DPT 02/23/2019, 7:51 AM

## 2019-02-23 NOTE — Progress Notes (Signed)
ANTICOAGULATION CONSULT NOTE - Follow Up Consult  Pharmacy Consult for Coumadin Indication: atrial fibrillation  No Known Allergies  Patient Measurements: Height: 5\' 10"  (177.8 cm) Weight: 209 lb 14.4 oz (95.2 kg) IBW/kg (Calculated) : 73  Vital Signs: Temp: 97.7 F (36.5 C) (04/14 0512) Temp Source: Oral (04/14 0512) BP: 120/76 (04/14 0512) Pulse Rate: 72 (04/14 0512)  Labs: Recent Labs    02/21/19 0604 02/22/19 0637 02/23/19 0526  HGB 8.7*  --   --   HCT 28.7*  --   --   PLT 461*  --   --   LABPROT 22.7* 23.4* 22.9*  INR 2.0* 2.1* 2.1*    Estimated Creatinine Clearance: 79 mL/min (by C-G formula based on SCr of 0.95 mg/dL).  Assessment: 72 YOM on Coumadin 5mg  daily exc 2.5mg  on Sun PTA. S/p Flagyl (LD 4/2). Admit INR was 4.3 (had been stable on this dose as outpt). Given vit K 5mg  IV 3/31> 1.6, resumed 4/2 then held again has INR raised quickly. INR now mostly stable at 2.1 but has been getting a lower dose then what he was taking PTA. Hgb 8.7, plts 461 noted.   Goal of Therapy:  INR 2-3 Monitor platelets by anticoagulation protocol: Yes   Plan:  Give Coumadin 5 mg PO today Monitor daily INR, s/s of bleed   Elenor Quinones, PharmD, BCPS, BCIDP Clinical Pharmacist 02/23/2019 8:12 AM

## 2019-02-24 ENCOUNTER — Inpatient Hospital Stay (HOSPITAL_COMMUNITY): Payer: Medicare Other | Admitting: *Deleted

## 2019-02-24 ENCOUNTER — Inpatient Hospital Stay (HOSPITAL_COMMUNITY): Payer: Medicare Other | Admitting: Occupational Therapy

## 2019-02-24 ENCOUNTER — Inpatient Hospital Stay (HOSPITAL_COMMUNITY): Payer: Self-pay

## 2019-02-24 ENCOUNTER — Inpatient Hospital Stay (HOSPITAL_COMMUNITY): Payer: Medicare Other | Admitting: Physical Therapy

## 2019-02-24 LAB — PROTIME-INR
INR: 2.2 — ABNORMAL HIGH (ref 0.8–1.2)
Prothrombin Time: 24.1 seconds — ABNORMAL HIGH (ref 11.4–15.2)

## 2019-02-24 MED ORDER — WARFARIN SODIUM 5 MG PO TABS
5.0000 mg | ORAL_TABLET | Freq: Once | ORAL | Status: AC
  Administered 2019-02-24: 16:00:00 5 mg via ORAL
  Filled 2019-02-24: qty 1

## 2019-02-24 NOTE — Progress Notes (Signed)
Occupational Therapy Weekly Progress Note  Patient Details  Name: Russell Dillon MRN: [id number] Date of Birth: [birth year]  Beginning of progress report period: February 17, 2019 End of progress report period: February 24, 2019  Today's Date: 02/24/2019 OT Individual Time: 4496-7591 OT Individual Time Calculation (min): 53 min    Patient has met 1 of 4 short term goals.  Russell Dillon is making steady but limited progress with OT secondary to his awareness and memory as well as LLE pain.  He is not oriented to place, time, or situation with each session.  He needs min assist for transfers from supine to sit with mod to max assist for stand pivot transfers with use of the RW.  He is able to complete transfers with use of the sliding board with supervision after placement.  Standing endurance is limited to 1 minute or less with use of the RW.  He demonstrates flexed posture in standing in his trunk as well.  Supervision for UB selfcare with max assist currently for LB bathing and dressing sit to supine.  Therapist has started integrating AE for LB dressing with max assist currently.  He is unable to carryover any techniques or direction from session to session or within the session with regards to hand placement or techniques.  Feel his cognition is a big barrier to his progress.  Will continue with current OT POC with family education scheduled for next week as pt will need physical 24 hour assist at discharge.    Patient continues to demonstrate the following deficits: muscle weakness, decreased initiation, decreased awareness, decreased problem solving, decreased safety awareness and decreased memory and decreased standing balance and decreased balance strategies and therefore will continue to benefit from skilled OT intervention to enhance overall performance with BADL and Vocation.  Patient progressing toward long term goals..  Continue plan of care.  OT Short Term Goals Week 2:  OT Short Term  Goal 1 (Week 2): Continue working on established goals at supervision to min assist level goals.   Skilled Therapeutic Interventions/Progress Updates:    Pt worked on donning his sock and shoe on the left foot to begin using AE.  Min assist after setup of sockaide to donn the sock.  Max assist for donning and tying shoe.  Pt then pushed the wheelchair down to the ortho gym with supervision and one rest break.  Once in the gym, he completed sliding board transfer from the wheelchair to the mat with supervision after placement of the board.  He worked on sit to stand transitions from the higher mat as well as standing endurance.  He was able to complete 3 sit to stand transitions with max demonstrational cueing for hand placement and mod assist.  He demonstrates flexed trunk in standing as well as cervical flexion.  Standing endurance was up to 1 min with mod instructional cueing to remain standing.  Mod assist for stand pivot transfer from the mat to the wheelchair and to the bed from the mat.  He was left with call button and phone in reach with bed alarm in place.    Therapy Documentation Precautions:  Precautions Precautions: Fall Precaution Comments: maternity pads used to prevent [name] from rubbing Required Braces or Orthoses: Other Brace Other Brace: Left knee hinged brace locked in extension at all times. NO KNEE ROM Restrictions Weight Bearing Restrictions: No LLE Weight Bearing: Weight bearing as tolerated   Pain: Pain Assessment Pain Scale: Faces Pain Score: 0-No pain Faces Pain  Scale: Hurts a little bit Pain Type: Surgical pain Pain Location: Knee Pain Orientation: Left Pain Descriptors / Indicators: Discomfort Pain Onset: With Activity Pain Intervention(s): Repositioned ADL: See Care Tool Section of chart  Therapy/Group: Individual Therapy  Monicka Cyran,Moyses OTR/L 02/24/2019, 11:23 AM

## 2019-02-24 NOTE — Progress Notes (Signed)
Physical Therapy Session Note  Patient Details  Name: Russell Dillon MRN: [id number] Date of Birth: [birth year]  Today's Date: 02/24/2019 PT Individual Time: 0800-0845 PT Individual Time Calculation (min): 45 min   Short Term Goals: Week 2:  PT Short Term Goal 1 (Week 2): STG=LTG due to ELOS  Skilled Therapeutic Interventions/Progress Updates:    Pt received seated in bed, agreeable to PT session. No complaints of pain. Pt agreeable to don clothes prior to getting up to w/c. Pt found to have been incontinent of urine and bowel in his brief, pt unaware. Rolling L/R with max A for pericare. Pt having continuous bowel movement while pericare performed. Pt is assist x 2 for rolling L/R in order to achieve full sidelying so that thorough pericare can be completed. Pt is dependent for pericare and to don new brief and pants. Pt is min A for supine to sit. Slide board transfer bed to w/c with min to mod A with increased time and cueing needed for head/hips relationship. Pt left seated in w/c in room with needs in reach, quick release belt and chair alarm in place at end of session.  Therapy Documentation Precautions:  Precautions Precautions: Fall Precaution Comments: maternity pads used to prevent [name] from rubbing Required Braces or Orthoses: Other Brace Other Brace: Left knee hinged brace locked in extension at all times. NO KNEE ROM Restrictions Weight Bearing Restrictions: No LLE Weight Bearing: Weight bearing as tolerated    Therapy/Group: Individual Therapy   Excell Seltzer, PT, DPT  02/24/2019, 12:17 PM

## 2019-02-24 NOTE — Progress Notes (Signed)
ANTICOAGULATION CONSULT NOTE - Follow Up Consult  Pharmacy Consult for Coumadin Indication: atrial fibrillation  No Known Allergies  Patient Measurements: Height: 5\' 10"  (177.8 cm) Weight: 209 lb 11.3 oz (95.1 kg) IBW/kg (Calculated) : 73  Vital Signs: Temp: 98 F (36.7 C) (04/15 0542) Temp Source: Oral (04/15 0542) BP: 112/99 (04/15 0542) Pulse Rate: 84 (04/15 0542)  Labs: Recent Labs    02/22/19 0637 02/23/19 0526 02/24/19 0644  LABPROT 23.4* 22.9* 24.1*  INR 2.1* 2.1* 2.2*    Estimated Creatinine Clearance: 78.9 mL/min (by C-G formula based on SCr of 0.95 mg/dL).  Assessment: 72 YOM on Coumadin 5mg  daily except 2.5mg  on Sun PTA. S/p Flagyl (LD 4/2). Admit INR was 4.3 (had been stable on this dose as outpt). Given vit K 5mg  IV 3/31> 1.6, resumed 4/2 then held again has INR raised quickly.   INR now stable at 2.2, Hgb 8.7, plts 461 noted.   Goal of Therapy:  INR 2-3 Monitor platelets by anticoagulation protocol: Yes   Plan:  Give Coumadin 5 mg PO today Monitor daily INR, s/s of bleed   [employer], Pharm.D., BCPS Clinical Pharmacist Clinical phone for 02/24/2019 from 8:30-4:00 is [phone].  **Pharmacist phone directory can now be found on [email or web address] ([email or web address]).  Listed under [facility].  02/24/2019 1:18 PM

## 2019-02-24 NOTE — Patient Care Conference (Signed)
Inpatient RehabilitationTeam Conference and Plan of Care Update Date: 02/24/2019   Time: 11:20 AM    Patient Name: Russell Dillon      Medical Record Number: [id number]  Date of Birth: Dec 26, 1945 Sex: Male         Room/Bed: 4W23C/4W23C-01 Payor Info: Payor: MEDICARE / Plan: MEDICARE PART A AND B / Product Type: *No Product type* /    Admitting Diagnosis: LEFT PATELLA TENDON REPAIR  Admit Date/Time:  02/15/2019  4:52 PM Admission Comments: No comment available   Primary Diagnosis:  <principal problem not specified> Principal Problem: <principal problem not specified>  Patient Active Problem List   Diagnosis Date Noted  . Hypoalbuminemia   . Leukocytosis   . Hyperglycemia   . Atrial fibrillation ([place])   . Acute on chronic anemia   . Rupture of left patellar tendon 02/15/2019  . Postoperative pain   . Benign prostatic hyperplasia   . Bipolar 1 disorder ([place]) 02/14/2019  . Dementia without behavioral disturbance ([place]) 02/14/2019  . Patellar tendon rupture, left, initial encounter 02/10/2019  . SIRS (systemic inflammatory response syndrome) ([place]) 02/09/2019  . Osteomyelitis ([place]) 02/09/2019  . Acute blood loss anemia 02/09/2019  . Aortic valve regurgitation 10/16/2018  . Encounter for therapeutic drug monitoring 12/02/2013  . Small bowel obstruction ([place]) 07/08/2013  . Hyperlipidemia 12/20/2011  . HTN (hypertension) 06/04/2011  . Atrial fibrillation, chronic 03/01/2011  . Long term (current) use of anticoagulants 03/01/2011  . GERD 10/11/2009  . DIVERTICULOSIS OF COLON 10/11/2009    Expected Discharge Date: Expected Discharge Date: 03/02/19  Team Members Present: Physician leading conference: Dr. Alysia Penna Social Worker Present: Ovidio Kin, LCSW Nurse Present: Rosita Fire, LPN PT Present: Michaelene Song, PT OT Present: Clyda Greener, OT SLP Present: Stormy Fabian, SLP PPS Coordinator present : Gunnar Fusi     Current Status/Progress Goal Weekly Team Focus   Medical   occ incont, no sig knee pain except at night  avoid quad contraction  manage low BPs   Bowel/Bladder   incontinent of bowel and bladder, has moments of continence  more continent episodes  timed toileting?   Swallow/Nutrition/ Hydration             ADL's   Supervision for UB selfcare, max assist for LB selfcare supine to sit to stand.  max assist for stand pivot transfers with use of the RW.  No carryover from session to session secondary to impaired memory  min assist LB bathing, LB dressing, toileting, Supervision UB dressing, grooming, toilet transfers  selfcare retraining, functional transfers, DME/AE education, therapeutic activities, balance retraining,    Mobility   min assist bed mobility and transfers with slideboard, mod-max assist sit<>stand, unable to perform gait  min-mod assist at w/c level  transfers, education, d/c planning, standing   Communication             Safety/Cognition/ Behavioral Observations            Pain   c/o left knee pain which is 8/10 post activity  pain rating < 5   assess pain q shift and treat   Skin   surgery site to left knee  no new skin breakdown while in rehab  assess skin q shift      *See Care Plan and progress notes for long and short-term goals.     Barriers to Discharge  Current Status/Progress Possible Resolutions Date Resolved   Physician    Medical stability;Wound Care;Decreased caregiver support;Other (comments)  hx dementia  slow progress for WC level goals  cont rehab, WC level goals      Nursing                  PT                    OT                  SLP                SW                Discharge Planning/Teaching Needs:    Home with wife who wants to take home and not place in a NH. She will come in for family education and do what she needs to do to take pt home.     Team Discussion:  Progressing slowly in therapies, his memory and dementia limits him. He can not WB on his legs, also motor  planning issues due to dementia and will therefore be wheelchair level at discharge. Mostly continent with urinal and calling now. Has no carryover over 10 minutes. Wife coming in Friday and then again Monday for education wants to take him home.  Revisions to Treatment Plan:  DC 4/21    Continued Need for Acute Rehabilitation Level of Care: The patient requires daily medical management by a physician with specialized training in physical medicine and rehabilitation for the following conditions: Daily direction of a multidisciplinary physical rehabilitation program to ensure safe treatment while eliciting the highest outcome that is of practical value to the patient.: Yes Daily medical management of patient stability for increased activity during participation in an intensive rehabilitation regime.: Yes Daily analysis of laboratory values and/or radiology reports with any subsequent need for medication adjustment of medical intervention for : Neurological problems;Blood pressure problems   I attest that I was present, lead the team conference, and concur with the assessment and plan of the team. Teleconference held due to COVID 19   Yug Loria, Gardiner Rhyme 02/25/2019, 9:14 AM

## 2019-02-24 NOTE — Progress Notes (Signed)
Physical Therapy Weekly Progress Note  Patient Details  Name: Russell Dillon MRN: [id number] Date of Birth: [birth year]  Beginning of progress report period: February 16, 2019 End of progress report period: February 24, 2019  Today's Date: 02/24/2019 PT Individual Time: 9758-8325 PT Individual Time Calculation (min): 56 min   Patient has met 1 of 4 short term goals. Pt is limited secondary to cognitive deficits and L LE pain with standing affecting all mobility. Pt is unable to tolerate standing for longer periods and has not been able to participate in any gait training. The patient will discharge at a w/c level.   Patient continues to demonstrate the following deficits muscle weakness, decreased attention, decreased awareness, decreased memory and delayed processing and decreased standing balance, decreased balance strategies and difficulty maintaining precautions and therefore will continue to benefit from skilled PT intervention to increase functional independence with mobility.  Patient not progressing toward long term goals.  See goal revision..  Plan of care revisions: Goals have been downgraded to moderate assist for standing and w/c level goals overall. .  PT Short Term Goals Week 1:  PT Short Term Goal 1 (Week 1): Pt will perform sit<>stand transfers using LRAD with no more than mod assist PT Short Term Goal 1 - Progress (Week 1): Progressing toward goal PT Short Term Goal 2 (Week 1): Pt wil perform bed<>chair transfers with no more than mod assist consistently PT Short Term Goal 2 - Progress (Week 1): Met PT Short Term Goal 3 (Week 1): Pt will ambulate at least 15f using LRAD with max assist PT Short Term Goal 3 - Progress (Week 1): Not met PT Short Term Goal 4 (Week 1): Pt will initiate stair training PT Short Term Goal 4 - Progress (Week 1): Not met Week 2:  PT Short Term Goal 1 (Week 2): STG=LTG due to ELOS  Skilled Therapeutic Interventions/Progress Updates:    Pt supine in  bed upon PT arrival, agreeable to therapy tx and denies pain at rest. Pt transferred to sitting with min assist for L LE management, cues for techniques. Pt performed slideboard transfer to w/c with min assist and propelled w/c to the gym x 200 ft with supervision using B UEs. Slideboard transfer to mat with min assist, cues for techniques/sequencing. Pt performed x 20 LAQ with R LE while seated edge of mat for strengthening. Pt performed x 2 sit<>stands from edge of mat with RW this session with mod assist and x 1 sit<>stand from mat with max assist and RW. Pt able to maintain standing for 10-12 second bouts before saying "I've just got to sit." Pt performed strengthening while seated edge of mat this session, therapist providing cues for techniques/form, 2 x 10 each: bicep curls 3#, UE punches with 3# dumbbell, R hip flexion marches, unweighted shoulder press, unweighted shoulder abduction. Slideboard transfer back to w/c with min assist. Pt propelled w/c back to room with supervision using B UEs x 200 ft. Pt performed slideboard transfer back to bed with min assist, transferred to supine min assist and left with needs in reach and bed alarm set.   Therapy Documentation Precautions:  Precautions Precautions: Fall Precaution Comments: maternity pads used to prevent [name] from rubbing Required Braces or Orthoses: Other Brace Other Brace: Left knee hinged brace locked in extension at all times. NO KNEE ROM Restrictions Weight Bearing Restrictions: No LLE Weight Bearing: Weight bearing as tolerated   Therapy/Group: Individual Therapy  ENetta Corrigan PT, DPT 02/24/2019, 7:54 AM

## 2019-02-24 NOTE — Progress Notes (Signed)
Social Work Patient ID: Russell Dillon, male   DOB: 18-Jul-1946, 73 y.o.   MRN: [id number] Met with pt and spoke with wife via telephone to discuss team conference progress toward his goals of min assist and the fact he is not WB on either leg which could be a motor planning issue and a pain issue. Therefore he will be wheelchair level at discharge. Have asked wife to come in to being learning his care and to see if it is feasible for him to go home with her. She will come in Friday and then come back Monday if needed. Wife wants to get him home and provide care. Will work on getting pt ready for home with home health and equipment, along with family education.

## 2019-02-24 NOTE — Progress Notes (Signed)
Recreational Therapy Session Note  Patient Details  Name: Russell Dillon MRN: [id number] Date of Birth: 03-27-1946 Today's Date: 02/24/2019 Time:  1403-1430 Pain: no c/o Skilled Therapeutic Interventions/Progress Updates: Pt referred by team for TR services.  Attempted eval completion but pt not oriented to place or situation- confused and requesting assistance in calling his wife.  Called wife and pt spent ~15 minutes talking with her about situation, discharge, etc.  Pt appreciative of assistance.  Eval deferred as pt is not appropriate for TR services at this time. Pt left in bed with SR x 3 , bed alarm on and all items within reach.  Therapy/Group: Individual Therapy Celes Dedic 02/24/2019, 3:40 PM

## 2019-02-24 NOTE — Progress Notes (Signed)
Physical Therapy Session Note  Patient Details  Name: Russell Dillon MRN: [id number] Date of Birth: [birth year]  Today's Date: 02/24/2019 PT Individual Time: 1450-1532 PT Individual Time Calculation (min): 42 min   Short Term Goals: Week 2:  PT Short Term Goal 1 (Week 2): STG=LTG due to ELOS  Skilled Therapeutic Interventions/Progress Updates:    Patient received in bed, asleep but easily woken and willing to participate in PT today. Required MinA for supine to sit transfer primarily for L LE management, then able to perform sliding board transfer to R with MinA into WC (attempted to L but patient very anxious due to pain in L LE). Able to self-propel WC to PT gym with S and VC for navigation and extended time. Participated in B UE strengthening today including chest presses, overhead presses, and hitting ball with weight rod. Refused LE strengthening or progression of UE weights today. Took him back to his room [facility]A in [facility] and required [place] for sliding board transfer back to bed, [place] for sit to supine. He was left in bed with all needs met, bed alarm active.   Therapy Documentation Precautions:  Precautions Precautions: Fall Precaution Comments: maternity pads used to prevent [name] from rubbing Required Braces or Orthoses: Other Brace Other Brace: Left knee hinged brace locked in extension at all times. NO KNEE ROM Restrictions Weight Bearing Restrictions: No LLE Weight Bearing: Weight bearing as tolerated General:   Pain: Pain Assessment Pain Scale: Faces Faces Pain Scale: Hurts a little bit Pain Type: Surgical pain Pain Location: Knee Pain Orientation: Left Pain Descriptors / Indicators: Discomfort Pain Onset: With Activity Patients Stated Pain Goal: 0 Pain Intervention(s): Repositioned Multiple Pain Sites: No    Therapy/Group: Individual Therapy   Deniece Ree PT, DPT, CBIS  Supplemental Physical Therapist [facility]    Pager [phone] Acute Rehab  Office [phone]    02/24/2019, 3:56 PM

## 2019-02-24 NOTE — Progress Notes (Signed)
[facility] PHYSICAL MEDICINE & REHABILITATION PROGRESS NOTE  Subjective/Complaints:  Patient is without new issues overnight. Patient has limited verbal output, poor orientation  ROS: Denies CP, SOB, N/V/D.  Objective: Vital Signs: Blood pressure (!) 112/99, pulse 84, temperature 98 F (36.7 C), temperature source Oral, resp. rate 18, height 5' 10"  (1.778 m), weight 95.1 kg, SpO2 99 %. No results found. No results for input(s): WBC, HGB, HCT, PLT in the last 72 hours. No results for input(s): NA, K, CL, CO2, GLUCOSE, BUN, CREATININE, CALCIUM in the last 72 hours.  Physical Exam: BP (!) 112/99 (BP Location: Right Arm)   Pulse 84   Temp 98 F (36.7 C) (Oral)   Resp 18   Ht 5' 10"  (1.778 m)   Wt 95.1 kg   SpO2 99%   BMI 30.09 kg/m  Constitutional: Well-developed. NAD. Vital signs reviewed.  HENT: Normocephalic, atraumatic. Eyes: EOMI. No discharge.  Respiratory: Effort normal.  GI: He exhibits no distension.  Cor:  Irreg, irreg Musculoskeletal: Left lower extremity with edema and tenderness  Neurological: He is alert.  Patient is pleasantly confused.  Oriented x1 Follow simple commands Motor: Right lower extremity: 4+/5 proximal distal Left lower extremity: Hip flexion 2+/5, knee locked in brace, ankle dorsiflexion 4/5  Skin:  Left knee with immobilizer in place.  Dressing C/D/I  Psychiatric: His speech is delayed. He is slowed. Cognition and memory are impaired.   Assessment/Plan: 1. Functional deficits secondary to left knee patellar tendon rupture and meniscal debridement s/p repair which require 3+ hours per day of interdisciplinary therapy in a comprehensive inpatient rehab setting.  Physiatrist is providing close team supervision and 24 hour management of active medical problems listed below.  Physiatrist and rehab team continue to assess barriers to discharge/monitor patient progress toward functional and medical goals  Care Tool:  Bathing  Bathing activity  did not occur: Safety/medical concerns Body parts bathed by patient: Right arm, Left arm, Abdomen, Front perineal area, Chest, Right upper leg, Right lower leg, Face   Body parts bathed by helper: Left upper leg, Left lower leg Body parts n/a: Left upper leg, Left lower leg(LLE in knee immobilizer)   Bathing assist Assist Level: Moderate Assistance - Patient 50 - 74%     Upper Body Dressing/Undressing Upper body dressing   What is the patient wearing?: Pull over shirt    Upper body assist Assist Level: Supervision/Verbal cueing    Lower Body Dressing/Undressing Lower body dressing      What is the patient wearing?: Incontinence brief, Pants     Lower body assist Assist for lower body dressing: Maximal Assistance - Patient 25 - 49%     Toileting Toileting Toileting Activity did not occur Landscape architect and hygiene only): (condom cath.)  Toileting assist Assist for toileting: Total Assistance - Patient < 25% Assistive Device Comment: urinal   Transfers Chair/bed transfer  Transfers assist     Chair/bed transfer assist level: Moderate Assistance - Patient 50 - 74%(stand pivot with the RW)     Locomotion Ambulation   Ambulation assist   Ambulation activity did not occur: Safety/medical concerns          Walk 10 feet activity   Assist  Walk 10 feet activity did not occur: Safety/medical concerns        Walk 50 feet activity   Assist Walk 50 feet with 2 turns activity did not occur: Safety/medical concerns         Walk 150 feet activity  Assist Walk 150 feet activity did not occur: Safety/medical concerns         Walk 10 feet on uneven surface  activity   Assist Walk 10 feet on uneven surfaces activity did not occur: Safety/medical concerns         Wheelchair     Assist Will patient use wheelchair at discharge?: Yes Type of Wheelchair: Manual    Wheelchair assist level: Supervision/Verbal cueing Max wheelchair  distance: 110f    Wheelchair 50 feet with 2 turns activity    Assist        Assist Level: Supervision/Verbal cueing   Wheelchair 150 feet activity     Assist Wheelchair 150 feet activity did not occur: Refused   Assist Level: Supervision/Verbal cueing      Medical Problem List and Plan: 1.  Decreased functional mobility secondary to acute on chronic left knee pain status post repair of left patellar tendon rupture and scope debridement of meniscal tears 02-2019 and 02/13/2019. Weightbearing as tolerated with full knee extension brace must be on an full extension no knee range of motion. CIR PT ,OT Team conference today please see physician documentation under team conference tab, met with team face-to-face to discuss problems,progress, and goals. Formulized individual treatment plan based on medical history, underlying problem and comorbidities.   Brace at all times in locked, no weightbearing unless braces on 2.  Antithrombotics: -DVT/anticoagulation:  Chronic Coumadin  INR 2.1             -antiplatelet therapy: N/A 3. Pain Management: hydrocodone as needed 4. Mood:             -antipsychotic agents: lithium 150 mg daily 5. Neuropsych: This patient is not capable of making decisions on his own behalf. 6. Skin/Wound Care:  Routine skin checks 7. Fluids/Electrolytes/Nutrition:  Routine ins and outs  BMP within acceptable range on 4/7 8. Acute on chronic anemia.   Hb 9.6 on 4/7, 9.2 on 4/10 stable, 8.7 on 4/12, trending down but INR has been supratherapeutic check stool guaic 9. Atrial fibrillation. Cardizem 360 mg daily.   Monitor with increased mobility  Vitals:   02/23/19 1932 02/24/19 0542  BP: (!) 110/56 (!) 112/99  Pulse: 80 84  Resp: 19 18  Temp: 98.4 F (36.9 C) 98 F (36.7 C)  SpO2:  99%  Heart rate is well controlled 4/1 5 10. BPH. Pro scar 11. Hyperlipidemia. [place] 12. Hyperglycemia  Monitor with increased mobility 13. Leukocytosis resolved no  need to monitor  14. Hypoalbuminemia  Supplement initiated on 4/7 15.  Hx dementia on Aricept- has difficulty following commands, , needs cueing to perform MMT correctly, as per OT this has impacted therapy progress LOS: 9 days [place] Lal[place] W[place] 02/24/2019, 11:34 AM

## 2019-02-25 ENCOUNTER — Inpatient Hospital Stay (HOSPITAL_COMMUNITY): Payer: Medicare Other | Admitting: Occupational Therapy

## 2019-02-25 ENCOUNTER — Inpatient Hospital Stay (HOSPITAL_COMMUNITY): Payer: Medicare Other | Admitting: Physical Therapy

## 2019-02-25 LAB — PROTIME-INR
INR: 2.6 — ABNORMAL HIGH (ref 0.8–1.2)
Prothrombin Time: 27.6 seconds — ABNORMAL HIGH (ref 11.4–15.2)

## 2019-02-25 LAB — GLUCOSE, CAPILLARY: Glucose-Capillary: 106 mg/dL — ABNORMAL HIGH (ref 70–99)

## 2019-02-25 MED ORDER — WARFARIN SODIUM 2.5 MG PO TABS
2.5000 mg | ORAL_TABLET | Freq: Once | ORAL | Status: AC
  Administered 2019-02-25: 2.5 mg via ORAL
  Filled 2019-02-25: qty 1

## 2019-02-25 NOTE — Progress Notes (Signed)
ANTICOAGULATION CONSULT NOTE - Follow Up Consult  Pharmacy Consult for Coumadin Indication: atrial fibrillation  No Known Allergies  Patient Measurements: Height: 5\' 10"  (177.8 cm) Weight: 134 lb 7.7 oz (61 kg) IBW/kg (Calculated) : 73  Vital Signs: Temp: 97.3 F (36.3 C) (04/16 0539) BP: 107/64 (04/16 0539) Pulse Rate: 65 (04/16 0539)  Labs: Recent Labs    02/23/19 0526 02/24/19 0644 02/25/19 0526  LABPROT 22.9* 24.1* 27.6*  INR 2.1* 2.2* 2.6*    Estimated Creatinine Clearance: 58.9 mL/min (by C-G formula based on SCr of 0.95 mg/dL).  Assessment: 71 YOM with Afib on Coumadin 5mg  daily exc 2.5mg  on Sun PTA. S/p Flagyl (LD 4/2). Admit INR was 4.3 (had been stable on this dose as outpt). Given vit K 5mg  IV 3/31> 1.6, resumed 4/2 then held again has INR raised quickly. INR now up to 2.6 but still therapeutic. Hgb 8.7, plts 461  Goal of Therapy:  INR 2-3 Monitor platelets by anticoagulation protocol: Yes   Plan:  Give Coumadin 2.5 mg PO today Monitor daily INR, s/s of bleed   Elenor Quinones, PharmD, BCPS, BCIDP Clinical Pharmacist 02/25/2019 10:03 AM

## 2019-02-25 NOTE — Progress Notes (Signed)
Physical Therapy Session Note  Patient Details  Name: Russell Dillon MRN: [id number] Date of Birth: 08/14/1946  Today's Date: 02/25/2019 PT Individual Time: 1134-1231 PT Individual Time Calculation (min): 57 min   Short Term Goals: Week 2:  PT Short Term Goal 1 (Week 2): STG=LTG due to ELOS  Skilled Therapeutic Interventions/Progress Updates:    Pt received sitting in w/c and agreeable to therapy session. Pt propelled w/c ~145ft using B UEs with supervision for safety and min cuing for placement of UEs on wheels. Pt performed w/c to EOM R lateral scoot transfer using transfer board with no recall of proper set-up, continuing to require max cuing for transfer set-up and sequencing - max assist for transfer board placement and supervision for scooting. Pt performed 2x sit<>stand from elevated EOM to RW with max assist for lifting and pt tolerated standing for 30seconds each trial with encouragement - pt unable to achieve full upright maintaining forward trunk flexed posture and weightbearing primarily through R LE - pt deferred attempting 3rd stand. Pt performed seated B UE strengthening exercises 2 x 10 each: shoulder abduction against no resistance and rows against level 1 theraband resistance - verbal/visual/tactile cuing for proper form/technique. Pt performed L lateral scoot transfer EOM to w/c using transfer board requiring max assist for board placement and supervision during scooting. Pt performed w/c obstacle navigation of cone weaving using B UEs for propulsion with supervision and pt demonstrating improved w/c mobility technique only hitting a cone 1x. Pt performed R lateral transfer w/c to elevated EOM for unlevel transfer, using transfer board, pt continues to require max cuing for set-up of transfer, max assist for transfer board placement and close supervision during transfer. Pt performed L lateral scoot transfer downhill EOM to w/c using transfer board, max assist board placement and  supervision for transfer. Pt propelled w/c ~112ft using B UE for propulsion with supervision for safety - pt able to more quickly recall location of breaks and wheels with min cuing. Pt performed seated R LE hip flexion, knee extension, and sustained knee extension hold with ankle pumps all 2 sets of 10 each with visual demonstration and verbal cuing for proper technique. Pt left sitting in w/c with needs in reach and seat belt alarm on.   Therapy Documentation Precautions:  Precautions Precautions: Fall Precaution Comments: maternity pads used to prevent [name] from rubbing Required Braces or Orthoses: Other Brace Other Brace: Left knee hinged brace locked in extension at all times. NO KNEE ROM Restrictions Weight Bearing Restrictions: No LLE Weight Bearing: Weight bearing as tolerated  Pain: Pain Assessment Pain Scale: Faces Faces Pain Scale: Hurts little more Pain Location: Knee Pain Orientation: Left Pain Onset: With Activity(during standing)  Provided seated rest breaks throughout and performed therapy to pt's tolerance.  Therapy/Group: Individual Therapy  Tawana Scale, PT, DPT 02/25/2019, 7:57 AM

## 2019-02-25 NOTE — Progress Notes (Addendum)
Occupational Therapy Session Note  Patient Details  Name: Russell Dillon MRN: [id number] Date of Birth: November 15, 1945  Today's Date: 02/25/2019 OT Individual Time: 1331-1429 OT Individual Time Calculation (min): 58 min    Short Term Goals: Week 2:  OT Short Term Goal 1 (Week 2): Continue working on established goals at supervision to min assist level goals.   Skilled Therapeutic Interventions/Progress Updates:    Took pt down to the dayroom for session and had him complete sliding board transfer to the wheelchair with min assist after placement.  He worked on sit to stand and standing endurance with use of the RW from the elevated therapy mat.  He was able to complete sit to stand with overall mod assist but only maintained standing up to 30 seconds of time for 4 intervals.  He would then sit down stating "that's all I can do", unable to state why he needed to sit.  Pt needed increased rest break between each interval of 2-3 mins.  Stand pivot transfer was completed for transfer back to the wheelchair with use of the RW and mod assist.  Increased head, trunk, hip, and right knee flexion noted with all standing and transfers.  Pt finished session with supervision to roll his wheelchair back to the room.  Mod assist to locate his room number using the external aides given.  Pt completed sliding board transfer with min assist to the bed and then removed his shoes with mod assist.  Session completed with transition to supine from sitting with min assist.  Pt left with call button and phone in reach with bed alarm in place.    Therapy Documentation Precautions:  Precautions Precautions: Fall Precaution Comments: maternity pads used to prevent [name] from rubbing Required Braces or Orthoses: Other Brace Other Brace: Left knee hinged brace locked in extension at all times. NO KNEE ROM Restrictions Weight Bearing Restrictions: No LLE Weight Bearing: Weight bearing as tolerated  Pain: Pain  Assessment Pain Scale: Faces Faces Pain Scale: Hurts little more Pain Type: Surgical pain Pain Location: Knee Pain Orientation: Left Pain Descriptors / Indicators: Discomfort Pain Onset: With Activity(during standing) Pain Intervention(s): Medication (See eMAR);Repositioned Multiple Pain Sites: No ADL: See Care Tool Section details of ADL  Therapy/Group: Individual Therapy  [name],[name] OTR/L 02/25/2019, 2:30 PM

## 2019-02-25 NOTE — Progress Notes (Signed)
Occupational Therapy Session Note  Patient Details  Name: Russell Dillon MRN: [id number] Date of Birth: Jul 30, 1946  Today's Date: 02/25/2019 OT Individual Time: 6389-3734 OT Individual Time Calculation (min): 70 min    Short Term Goals: Week 2:  OT Short Term Goal 1 (Week 2): Continue working on established goals at supervision to min assist level goals.   Skilled Therapeutic Interventions/Progress Updates:    Pt received in bed sleeping, but woke fairly easily.  He did state he was in [facility] and in [place] but did not know what unit or floor he was on.  Needed mod cues for correct yr, mo, day of week.   Pt agreeable to a bath in bed but was adamant about not having his L leg touched. He did not want his leg washed.  Brace is in full extension but his knee has some flexion within the brace.   Pt did follow verbal directions extremely well with movement in the bed and demonstrated good initiation.  Upon rolling to have his brief removed, pt had a large incontinent stool and was unaware of it.  Pt had to roll R and L several times with S to min A to be adequately cleansed and have new brief donned.   Donned pants for pt from bed,  Did not try AE today as pt was generally very wary about moving his L leg much even with the brace on.  Once pants were up to his upper thighs, he did pull over hips 80% of the way rolling side to side.   Pt sat up to EOB with min A and then used board to his R side with only steadying A for the board.   Once in chair, pt refused to brush teeth.  He did work on a few arm exercises with orange theraband with max cues.  Pt fatigued easily and would only do a few reps at a time.  Pt resting in w/c with belt alarm on and all needs met.     Precautions Precautions: Fall Precaution Comments: maternity pads used to prevent [name] from rubbing Required Braces or Orthoses: Other Brace Other Brace: Left knee hinged brace locked in extension at all times. NO  KNEE ROM Restrictions Weight Bearing Restrictions: No LLE Weight Bearing: Weight bearing as tolerated      Pain: Pain Assessment Pain Scale: Faces Faces Pain Scale: Hurts little more Pain Location: Knee Pain Orientation: Left Pain Onset: With Activity(during standing)   Therapy/Group: Individual Therapy  [place] 02/25/2019, 12:20 PM

## 2019-02-25 NOTE — Progress Notes (Signed)
[place] PHYSICAL MEDICINE & REHABILITATION PROGRESS NOTE  Subjective/Complaints:  Patient is more alert today.  He is oriented to [facility] hospital but not to what type of injury he had.  Also not oriented to day or date or month.  He is oriented to 2020 He has no pain complaints, review of MAR indicates that he has been taking hydrocodone 1 to 2 tablets, 1-3 times per day  Drinking Ensure regularly, fluid intake still on the low side for 80 mL yesterday  Meal intake is 75 to 100%  ROS: Denies CP, SOB, N/V/D.  Objective: Vital Signs: Blood pressure 107/64, pulse 65, temperature (!) 97.3 F (36.3 C), resp. rate 19, height 5\' 10"  (1.778 m), weight 61 kg, SpO2 100 %. No results found. No results for input(s): WBC, HGB, HCT, PLT in the last 72 hours. No results for input(s): NA, K, CL, CO2, GLUCOSE, BUN, CREATININE, CALCIUM in the last 72 hours.  Physical Exam: BP 107/64 (BP Location: Right Arm)   Pulse 65   Temp (!) 97.3 F (36.3 C)   Resp 19   Ht 5\' 10"  (1.778 m)   Wt 61 kg   SpO2 100%   BMI 19.30 kg/m  Constitutional: Well-developed. NAD. Vital signs reviewed.  HENT: Normocephalic, atraumatic. Eyes: EOMI. No discharge.  Respiratory: Effort normal.  GI: He exhibits no distension.  Cor:  Irreg, irreg Musculoskeletal: Left lower extremity with edema and tenderness  Neurological: He is alert.  Patient is pleasantly confused.  Oriented x1 Follow simple commands Motor: Right lower extremity: 4+/5 proximal distal Left lower extremity: Hip flexion 2+/5, knee locked in brace, ankle dorsiflexion 4/5  Skin:  Left knee with immobilizer in place.  Dressing C/D/I  Psychiatric: His speech is delayed. He is slowed. Cognition and memory are impaired.   Assessment/Plan: 1. Functional deficits secondary to left knee patellar tendon rupture and meniscal debridement s/p repair which require 3+ hours per day of interdisciplinary therapy in a comprehensive inpatient rehab  setting.  Physiatrist is providing close team supervision and 24 hour management of active medical problems listed below.  Physiatrist and rehab team continue to assess barriers to discharge/monitor patient progress toward functional and medical goals  Care Tool:  Bathing  Bathing activity did not occur: Safety/medical concerns Body parts bathed by patient: Right arm, Left arm, Abdomen, Front perineal area, Chest, Right upper leg, Right lower leg, Face   Body parts bathed by helper: Left upper leg, Left lower leg Body parts n/a: Left upper leg, Left lower leg(LLE in knee immobilizer)   Bathing assist Assist Level: Moderate Assistance - Patient 50 - 74%     Upper Body Dressing/Undressing Upper body dressing   What is the patient wearing?: Pull over shirt    Upper body assist Assist Level: Supervision/Verbal cueing    Lower Body Dressing/Undressing Lower body dressing      What is the patient wearing?: Incontinence brief, Pants     Lower body assist Assist for lower body dressing: Maximal Assistance - Patient 25 - 49%     Toileting Toileting Toileting Activity did not occur Landscape architect and hygiene only): (condom cath.)  Toileting assist Assist for toileting: Total Assistance - Patient < 25% Assistive Device Comment: urinal   Transfers Chair/bed transfer  Transfers assist     Chair/bed transfer assist level: Moderate Assistance - Patient 50 - 74%     Locomotion Ambulation   Ambulation assist   Ambulation activity did not occur: Safety/medical concerns  Walk 10 feet activity   Assist  Walk 10 feet activity did not occur: Safety/medical concerns        Walk 50 feet activity   Assist Walk 50 feet with 2 turns activity did not occur: Safety/medical concerns         Walk 150 feet activity   Assist Walk 150 feet activity did not occur: Safety/medical concerns         Walk 10 feet on uneven surface  activity   Assist  Walk 10 feet on uneven surfaces activity did not occur: Safety/medical concerns         Wheelchair     Assist Will patient use wheelchair at discharge?: Yes Type of Wheelchair: Manual    Wheelchair assist level: Supervision/Verbal cueing Max wheelchair distance: 260ft    Wheelchair 50 feet with 2 turns activity    Assist        Assist Level: Supervision/Verbal cueing   Wheelchair 150 feet activity     Assist Wheelchair 150 feet activity did not occur: Refused   Assist Level: Supervision/Verbal cueing      Medical Problem List and Plan: 1.  Decreased functional mobility secondary to acute on chronic left knee pain status post repair of left patellar tendon rupture and scope debridement of meniscal tears 02-2019 and 02/13/2019. Weightbearing as tolerated with full knee extension brace must be on an full extension no knee range of motion. CIR PT ,OT Plan discharge for next week however question whether family will be able to take care of him.  Will need some caregiver training   Brace at all times in locked, no weightbearing unless braces on 2.  Antithrombotics: -DVT/anticoagulation:  Chronic Coumadin  INR 2.6, continue pharmacy protocol             -antiplatelet therapy: N/A 3. Pain Management: hydrocodone as needed 4. Mood:             -antipsychotic agents: lithium 150 mg daily 5. Neuropsych: This patient is not capable of making decisions on his own behalf. 6. Skin/Wound Care:  Routine skin checks 7. Fluids/Electrolytes/Nutrition:  Routine ins and outs  BMP within acceptable range on 4/7 8. Acute on chronic anemia.   Hb 9.6 on 4/7, 9.2 on 4/10 stable, 8.7 on 4/12, trending down but INR has been supratherapeutic check stool guaic, this is pending 9. Atrial fibrillation. Cardizem 360 mg daily.   Monitor with increased mobility  Vitals:   02/24/19 2001 02/25/19 0539  BP: 103/64 107/64  Pulse: 81 65  Resp: 18 19  Temp: 97.9 F (36.6 C) (!) 97.3 F  (36.3 C)  SpO2: 98% 100%  Heart rate is well controlled 4/1 5 10. BPH. Pro scar 11. Hyperlipidemia. Lovaza 12. Hyperglycemia  Monitor with increased mobility 13. Leukocytosis resolved no need to monitor  14. Hypoalbuminemia  Supplement initiated on 4/7 15.  Hx dementia on Aricept- has difficulty following commands, , needs cueing to perform MMT correctly,  LOS: 10 days A FACE TO [place] E  02/25/2019, 10:43 AM

## 2019-02-26 ENCOUNTER — Inpatient Hospital Stay (HOSPITAL_COMMUNITY): Payer: Medicare Other | Admitting: Physical Therapy

## 2019-02-26 ENCOUNTER — Inpatient Hospital Stay (HOSPITAL_COMMUNITY): Payer: Medicare Other | Admitting: Occupational Therapy

## 2019-02-26 ENCOUNTER — Encounter (HOSPITAL_COMMUNITY): Payer: Medicare Other | Admitting: Occupational Therapy

## 2019-02-26 ENCOUNTER — Ambulatory Visit (HOSPITAL_COMMUNITY): Payer: Self-pay | Admitting: Physical Therapy

## 2019-02-26 LAB — PROTIME-INR
INR: 2.7 — ABNORMAL HIGH (ref 0.8–1.2)
Prothrombin Time: 28.2 seconds — ABNORMAL HIGH (ref 11.4–15.2)

## 2019-02-26 MED ORDER — WARFARIN SODIUM 2.5 MG PO TABS
2.5000 mg | ORAL_TABLET | Freq: Once | ORAL | Status: AC
  Administered 2019-02-26: 18:00:00 2.5 mg via ORAL
  Filled 2019-02-26: qty 1

## 2019-02-26 NOTE — Progress Notes (Signed)
Social Work Patient ID: Russell Dillon, male   DOB: April 17, 1946, 73 y.o.   MRN: [id number] Met with pt and wife who is here for education to see how going. Wife does feels she will ned to come back Monday for more training. Have scheduled for 10-12 again. Have ordered equipment and aware will contact wife to set up delivery. Wife is arranging [employer] to transport pt home on Tuesday. Will work toward discharge Tuesday.

## 2019-02-26 NOTE — Progress Notes (Signed)
Physical Therapy Session Note  Patient Details  Name: Russell Dillon MRN: [id number] Date of Birth: [birth year]  Today's Date: 02/26/2019 PT Individual Time: 0800-0845 PT Individual Time Calculation (min): 45 min   Short Term Goals: Week 2:  PT Short Term Goal 1 (Week 2): STG=LTG due to ELOS  Skilled Therapeutic Interventions/Progress Updates:    Pt received seated in bed, agreeable to PT session. No complaints of pain. Pt reports feeling "funny" today, but unable to describe further. Vitals WNL. Pt does express some anxiety with regards to upcoming discharge. Supine to sit with min A for LLE management. Slide board transfer bed to w/c with CGA with v/c for transfer technique. Manual w/c propulsion 2 x 150 ft with use of BUE and Supervision, v/c for improved propulsion technique for improved body mechanics and energy conservation. Seated BUE strengthening therex with 4# weighted dowel: biceps curls, chest press. Pt left seated in w/c in room with needs in reach, quick release belt and chair alarm in place at end of session.  Therapy Documentation Precautions:  Precautions Precautions: Fall Precaution Comments: maternity pads used to prevent [name] from rubbing Required Braces or Orthoses: Other Brace Other Brace: Left knee hinged brace locked in extension at all times. NO KNEE ROM Restrictions Weight Bearing Restrictions: No LLE Weight Bearing: Weight bearing as tolerated    Therapy/Group: Individual Therapy   Excell Seltzer, PT, DPT  02/26/2019, 2:08 PM

## 2019-02-26 NOTE — Progress Notes (Signed)
[place] PHYSICAL MEDICINE & REHABILITATION PROGRESS NOTE  Subjective/Complaints:   The patient is alert today, he does not complain of any knee pain.  He is discussing going home next week.  He cannot tell me what day it is today  Meal intake is 75 to 100%  ROS: Denies CP, SOB, N/V/D.  Objective: Vital Signs: Blood pressure 102/69, pulse 90, temperature 97.8 F (36.6 C), temperature source Oral, resp. rate 18, height 5\' 10"  (1.778 m), weight 90.6 kg, SpO2 100 %. No results found. No results for input(s): WBC, HGB, HCT, PLT in the last 72 hours. No results for input(s): NA, K, CL, CO2, GLUCOSE, BUN, CREATININE, CALCIUM in the last 72 hours.  Physical Exam: BP 102/69   Pulse 90   Temp 97.8 F (36.6 C) (Oral)   Resp 18   Ht 5\' 10"  (1.778 m)   Wt 90.6 kg   SpO2 100%   BMI 28.66 kg/m  Constitutional: Well-developed. NAD. Vital signs reviewed.  HENT: Normocephalic, atraumatic. Eyes: EOMI. No discharge.  Respiratory: Effort normal.  GI: He exhibits no distension.  Cor:  Irreg, irreg Musculoskeletal: Left lower extremity with edema and tenderness  Neurological: He is alert.  Patient is pleasantly confused.  Oriented x person and place Follow simple commands Motor: Right lower extremity: 4+/5 proximal distal Left lower extremity: Hip flexion 2+/5, knee locked in brace, ankle dorsiflexion 4/5  Skin:  Left knee with immobilizer in place.  Dressing C/D/I  Psychiatric: His speech is delayed. He is slowed. Cognition and memory are impaired.   Assessment/Plan: 1. Functional deficits secondary to left knee patellar tendon rupture and meniscal debridement s/p repair which require 3+ hours per day of interdisciplinary therapy in a comprehensive inpatient rehab setting.  Physiatrist is providing close team supervision and 24 hour management of active medical problems listed below.  Physiatrist and rehab team continue to assess barriers to discharge/monitor patient progress toward  functional and medical goals  Care Tool:  Bathing  Bathing activity did not occur: Safety/medical concerns Body parts bathed by patient: Right arm, Left arm, Chest, Abdomen, Front perineal area, Right upper leg   Body parts bathed by helper: Right lower leg, Buttocks(pt refused to have L leg washed) Body parts n/a: Left upper leg, Left lower leg(LLE in knee immobilizer)   Bathing assist Assist Level: Moderate Assistance - Patient 50 - 74%     Upper Body Dressing/Undressing Upper body dressing   What is the patient wearing?: Pull over shirt    Upper body assist Assist Level: Supervision/Verbal cueing    Lower Body Dressing/Undressing Lower body dressing      What is the patient wearing?: Incontinence brief, Pants     Lower body assist Assist for lower body dressing: Maximal Assistance - Patient 25 - 49%     Toileting Toileting Toileting Activity did not occur Landscape architect and hygiene only): (condom cath.)  Toileting assist Assist for toileting: Total Assistance - Patient < 25% Assistive Device Comment: urinal   Transfers Chair/bed transfer  Transfers assist     Chair/bed transfer assist level: Supervision/Verbal cueing(max A for transfer board placement but supervision scooting)     Locomotion Ambulation   Ambulation assist   Ambulation activity did not occur: Safety/medical concerns          Walk 10 feet activity   Assist  Walk 10 feet activity did not occur: Safety/medical concerns        Walk 50 feet activity   Assist Walk 50 feet with  2 turns activity did not occur: Safety/medical concerns         Walk 150 feet activity   Assist Walk 150 feet activity did not occur: Safety/medical concerns         Walk 10 feet on uneven surface  activity   Assist Walk 10 feet on uneven surfaces activity did not occur: Safety/medical concerns         Wheelchair     Assist Will patient use wheelchair at discharge?: Yes Type  of Wheelchair: Manual    Wheelchair assist level: Supervision/Verbal cueing Max wheelchair distance: 127ft    Wheelchair 50 feet with 2 turns activity    Assist        Assist Level: Supervision/Verbal cueing   Wheelchair 150 feet activity     Assist Wheelchair 150 feet activity did not occur: Refused   Assist Level: Supervision/Verbal cueing      Medical Problem List and Plan: 1.  Decreased functional mobility secondary to acute on chronic left knee pain status post repair of left patellar tendon rupture and scope debridement of meniscal tears 02-2019 and 02/13/2019. Weightbearing as tolerated with full knee extension brace must be on an full extension no knee range of motion. CIR PT ,OT Plan discharge 4/21   Brace at all times in locked, no weightbearing unless braces on 2.  Antithrombotics: -DVT/anticoagulation:  Chronic Coumadin  INR 2.6, continue pharmacy protocol             -antiplatelet therapy: N/A 3. Pain Management: hydrocodone as needed 4. Mood:             -antipsychotic agents: lithium 150 mg daily 5. Neuropsych: This patient is not capable of making decisions on his own behalf. 6. Skin/Wound Care:  Routine skin checks 7. Fluids/Electrolytes/Nutrition:  Routine ins and outs  BMP within acceptable range on 4/7 8. Acute on chronic anemia.   Hb 9.6 on 4/7, 9.2 on 4/10 stable, 8.7 on 4/12, trending down but INR has been supratherapeutic check stool guaic, this is pending 9. Atrial fibrillation. Cardizem 360 mg daily.   Monitor with increased mobility  Vitals:   02/25/19 1940 02/26/19 0602  BP: (!) 99/55 102/69  Pulse: 75 90  Resp: 18 18  Temp:  97.8 F (36.6 C)  SpO2:  100%  Heart rate is well controlled 4/17, BP on low side but symptomatic      10. BPH. Pro scar 11. Hyperlipidemia. Lovaza 12. Hyperglycemia  Monitor with increased mobility 13. Leukocytosis resolved no need to monitor  14. Hypoalbuminemia  Supplement initiated on 4/7 15.  Hx  dementia on Aricept- has difficulty following commands, , needs cueing to perform MMT correctly,  LOS: 11 days A FACE TO [place] E Mava Suares 02/26/2019, 11:08 AM

## 2019-02-26 NOTE — Discharge Instructions (Addendum)
Inpatient Rehab Discharge Instructions  [facility] Discharge date and time: No discharge date for patient encounter.   Activities/Precautions/ Functional Status: Activity: weightbearing as tolerated with knee brace locked in extension. No active range of motion. Diet: regular diet Wound Care: keep wound clean and dry Functional status:  ___ No restrictions     ___ Walk up steps independently ___ 24/7 supervision/assistance   ___ Walk up steps with assistance ___ Intermittent supervision/assistance  ___ Bathe/dress independently ___ Walk with walker     _x__ Bathe/dress with assistance ___ Walk Independently    ___ Shower independently ___ Walk with assistance    ___ Shower with assistance ___ No alcohol     ___ Return to work/school ________  Special Instructions: No driving Home health nurse to check INR on 03/04/2019  results to [facility] [phone] fax number [phone]   COMMUNITY REFERRALS UPON DISCHARGE:    Home Health:   PT,OT, RN, [facility]   Date of last service:03/02/2019    Medical Equipment/Items Ordered:WHEELCHAIR, HOSPITAL BED-TRAPEZE BAR, 30 TRANSFER BOARD, 3 IN 1 AND ROLLING WALKER  Agency/Supplier:[employer]   [phone]  Other:PLANS TO HIRE ASSIST AND HAS A LTC POLICY KICKS IN AFTER 921 DAYS   My questions have been answered and I understand these instructions. I will adhere to these goals and the provided educational materials after my discharge from the hospital.  Patient/Caregiver Signature _______________________________ Date __________  Clinician Signature _______________________________________ Date __________  Please bring this form and your medication list with you to all your follow-up doctor's appointments.   Information on my medicine - Coumadin   (Warfarin)  Why was Coumadin prescribed for you? Coumadin was prescribed for you because you have a blood  clot or a medical condition that can cause an increased risk of forming blood clots. Blood clots can cause serious health problems by blocking the flow of blood to the heart, lung, or brain. Coumadin can prevent harmful blood clots from forming. As a reminder your indication for Coumadin is:   Stroke Prevention Because Of Atrial Fibrillation  What test will check on my response to Coumadin? While on Coumadin (warfarin) you will need to have an INR test regularly to ensure that your dose is keeping you in the desired range. The INR (international normalized ratio) number is calculated from the result of the laboratory test called prothrombin time (PT).  If an INR APPOINTMENT HAS NOT ALREADY BEEN MADE FOR YOU please schedule an appointment to have this lab work done by your health care provider within 7 days. Your INR goal is usually a number between:  2 to 3 or your provider may give you a more narrow range like 2-2.5.  Ask your health care provider during an office visit what your goal INR is.  What  do you need to  know  About  COUMADIN? Take Coumadin (warfarin) exactly as prescribed by your healthcare provider about the same time each day.  DO NOT stop taking without talking to the doctor who prescribed the medication.  Stopping without other blood clot prevention medication to take the place of Coumadin may increase your risk of developing a new clot or stroke.  Get refills before you run out.  What do you do if you miss a dose? If you miss a dose, take it as soon as you remember on the same day then continue your regularly scheduled regimen the next day.  Do not  take two doses of Coumadin at the same time.  Important Safety Information A possible side effect of Coumadin (Warfarin) is an increased risk of bleeding. You should call your healthcare provider right away if you experience any of the following: ? Bleeding from an injury or your nose that does not stop. ? Unusual colored urine (red or  dark brown) or unusual colored stools (red or black). ? Unusual bruising for unknown reasons. ? A serious fall or if you hit your head (even if there is no bleeding).  Some foods or medicines interact with Coumadin (warfarin) and might alter your response to warfarin. To help avoid this: ? Eat a balanced diet, maintaining a consistent amount of Vitamin K. ? Notify your provider about major diet changes you plan to make. ? Avoid alcohol or limit your intake to 1 drink for women and 2 drinks for men per day. (1 drink is 5 oz. wine, 12 oz. beer, or 1.5 oz. liquor.)  Make sure that ANY health care provider who prescribes medication for you knows that you are taking Coumadin (warfarin).  Also make sure the healthcare provider who is monitoring your Coumadin knows when you have started a new medication including herbals and non-prescription products.  Coumadin (Warfarin)  Major Drug Interactions  Increased Warfarin Effect Decreased Warfarin Effect  Alcohol (large quantities) Antibiotics (esp. Septra/Bactrim, Flagyl, Cipro) Amiodarone (Cordarone) Aspirin (ASA) Cimetidine (Tagamet) Megestrol (Megace) NSAIDs (ibuprofen, naproxen, etc.) Piroxicam (Feldene) Propafenone (Rythmol SR) Propranolol (Inderal) Isoniazid (INH) Posaconazole (Noxafil) Barbiturates (Phenobarbital) Carbamazepine (Tegretol) Chlordiazepoxide (Librium) Cholestyramine (Questran) Griseofulvin Oral Contraceptives Rifampin Sucralfate (Carafate) Vitamin K   Coumadin (Warfarin) Major Herbal Interactions  Increased Warfarin Effect Decreased Warfarin Effect  Garlic Ginseng Ginkgo biloba Coenzyme Q10 Green tea St. Johns wort    Coumadin (Warfarin) FOOD Interactions  Eat a consistent number of servings per week of foods HIGH in Vitamin K (1 serving =  cup)  Collards (cooked, or boiled & drained) Kale (cooked, or boiled & drained) Mustard greens (cooked, or boiled & drained) Parsley *serving size only =   cup Spinach (cooked, or boiled & drained) Swiss chard (cooked, or boiled & drained) Turnip greens (cooked, or boiled & drained)  Eat a consistent number of servings per week of foods MEDIUM-HIGH in Vitamin K (1 serving = 1 cup)  Asparagus (cooked, or boiled & drained) Broccoli (cooked, boiled & drained, or raw & chopped) Brussel sprouts (cooked, or boiled & drained) *serving size only =  cup Lettuce, raw (green leaf, endive, romaine) Spinach, raw Turnip greens, raw & chopped   These websites have more information on Coumadin (warfarin):  FailFactory.se; VeganReport.com.au;

## 2019-02-26 NOTE — Progress Notes (Signed)
Occupational Therapy Session Note  Patient Details  Name: Russell Dillon MRN: [id number] Date of Birth: April 14, 1946  Today's Date: 02/26/2019 OT Individual Time: 1316-1416 OT Individual Time Calculation (min): 60 min    Short Term Goals: Week 2:  OT Short Term Goal 1 (Week 2): Continue working on established goals at supervision to min assist level goals.   Skilled Therapeutic Interventions/Progress Updates:    Pt's spouse in for education.  Had her assist pt who was in bed at the time with clothing management for anticipated use of the bed pan or for simulated LB dressing tasks.  She was able to assist with rolling side to side with the pt needing min assist and mod demonstrational cueing, in order to push his pants down.  Discussed the need for her to remove pants from the right LE first and then the left.  Educated her on unlatching his hinged brace in supine in order to wash the leg and for placing it back.  Pt's spouse became dizzy during this part and needed to sit down.  Dizziness remained for the rest of the session with pt's spouse given water and a banana to eat.  BP checked in sitting at 68/48 and then in sitting reclined with LEs up.  BP slowly increased over time to 74/44, 86/59, 103/67, and eventually 122/61.  Nursing made aware and pt expressed that she was feeling better and did not wish to go to the ER.  She will come back on Monday for further education with covering therapist.  Pt left in bed with max assist for re-donning LB clothing as well as total assist to donn the LLE hinged knee brace.    Therapy Documentation Precautions:  Precautions Precautions: Fall Precaution Comments: maternity pads used to prevent [name] from rubbing Required Braces or Orthoses: Other Brace Other Brace: Left knee hinged brace locked in extension at all times. NO KNEE ROM Restrictions Weight Bearing Restrictions: No LLE Weight Bearing: Weight bearing as tolerated    Pain: Pain  Assessment Pain Scale: Faces Faces Pain Scale: Hurts little more Pain Type: Acute pain Pain Location: Knee Pain Orientation: Left Pain Descriptors / Indicators: Discomfort Pain Onset: With Activity Pain Intervention(s): Repositioned;Emotional support ADL: See Care Tool Section for some details of ADL  Therapy/Group: Individual Therapy  Oluwatimileyin Vivier,Tammie OTR/L 02/26/2019, 3:53 PM

## 2019-02-26 NOTE — Progress Notes (Signed)
Occupational Therapy Session Note  Patient Details  Name: Russell Dillon MRN: [id number] Date of Birth: 1946-01-03  Today's Date: 02/26/2019 OT Individual Time: 1002-1050 OT Individual Time Calculation (min): 48 min    Short Term Goals: Week 2:  OT Short Term Goal 1 (Week 2): Continue working on established goals at supervision to min assist level goals.   Skilled Therapeutic Interventions/Progress Updates:    Pt's wife in for family education during session.  Educated her on his ability to complete UB selfcare in sitting at the sink with supervision and cueing for orientation of his shirt prior to donning.  He was able to complete sliding board transfer to the bed from the wheelchair with min guard assist after setup of the board.  Pt exhibiting decreased ability to understand and complete scooting forward out of the back of the wheelchair prior to starting.  He was able to transition to supine with min assist for helping to lift the RLE.  Once in supine had pt work on rolling side to side to clean up soiled brief, bathe peri area, and donn new clothing.  Educated spouse on safe assist with rolling side to side in the bed to complete these tasks as well as technique for removing the clothing from the left leg last, while starting with dressing it first.  Pt needed total assist for all cleaning of buttocks as well as for donning new brief.  He did wash his front peri area with setup from supine and was able to roll with min assist overall and mod demonstrational cueing for hand placement to use the bed rails to assist.  Finished session with NT Kasey in to help finish with dressing.  Pt's spouse will need further practice with toileting and ADLs at bed level as well as sliding board transfers.    Therapy Documentation Precautions:  Precautions Precautions: Fall Precaution Comments: maternity pads used to prevent [name] from rubbing Required Braces or Orthoses: Other Brace Other Brace: Left  knee hinged brace locked in extension at all times. NO KNEE ROM Restrictions Weight Bearing Restrictions: No LLE Weight Bearing: Weight bearing as tolerated   Pain: Pain Assessment Pain Scale: Faces Pain Score: 2  Faces Pain Scale: Hurts a little bit Pain Type: Surgical pain Pain Location: Knee Pain Orientation: Left Pain Onset: With Activity Pain Intervention(s): Repositioned;Emotional support ADL: ADL Upper Body Bathing: Supervision/safety, Minimal cueing Where Assessed-Upper Body Bathing: Chair Lower Body Bathing: Dependent Where Assessed-Lower Body Bathing: Bed level Upper Body Dressing: Moderate assistance Where Assessed-Upper Body Dressing: Chair Lower Body Dressing: Dependent Where Assessed-Lower Body Dressing: Bed level Toileting: Dependent Where Assessed-Toileting: Bed level Toilet Transfer: Maximal assistance Toilet Transfer Method: (lateral scoot) Toilet Transfer Equipment: Drop arm bedside commode   Therapy/Group: Individual Therapy  Marquelle Balow,Brannen OTR/L 02/26/2019, 12:06 PM

## 2019-02-26 NOTE — Progress Notes (Signed)
ANTICOAGULATION CONSULT NOTE - Follow Up Consult  Pharmacy Consult for Coumadin Indication: atrial fibrillation  No Known Allergies  Patient Measurements: Height: 5\' 10"  (177.8 cm) Weight: 199 lb 11.8 oz (90.6 kg) IBW/kg (Calculated) : 73  Vital Signs: Temp: 97.8 F (36.6 C) (04/17 0602) Temp Source: Oral (04/17 0602) BP: 102/69 (04/17 0602) Pulse Rate: 90 (04/17 0602)  Labs: Recent Labs    02/24/19 0644 02/25/19 0526 02/26/19 0849  LABPROT 24.1* 27.6* 28.2*  INR 2.2* 2.6* 2.7*    Estimated Creatinine Clearance: 77.2 mL/min (by C-G formula based on SCr of 0.95 mg/dL).  Assessment: 73 yo M with Afib on Coumadin 5mg  daily exc 2.5mg  on Sun PTA. S/p Flagyl (LD 4/2). Admit INR was 4.3 (had been stable on this dose as outpt). Given vit K 5mg  IV 3/31> 1.6, resumed 4/2 then held again has INR raised quickly. INR now up to 2.7 but still therapeutic. Hgb 8.7, plts 461  Goal of Therapy:  INR 2-3 Monitor platelets by anticoagulation protocol: Yes   Plan:  Give Coumadin 2.5 mg PO today Monitor daily INR, s/s of bleed   Elenor Quinones, PharmD, BCPS, BCIDP Clinical Pharmacist 02/26/2019 10:21 AM

## 2019-02-26 NOTE — Progress Notes (Signed)
Physical Therapy Session Note  Patient Details  Name: Russell Dillon MRN: [id number] Date of Birth: 08/09/1946  Today's Date: 02/26/2019 PT Individual Time: 1135-1241 PT Individual Time Calculation (min): 66 min   Short Term Goals: Week 2:  PT Short Term Goal 1 (Week 2): STG=LTG due to ELOS  Skilled Therapeutic Interventions/Progress Updates:    Pt received supine in bed with pt's wife, [name], present and pt agreeable to therapy session. Therapy session focused on pt/spouse education and spouse hands-on training to assist pt with supine<>sit and lateral scoot transfers EOB<>w/c using transfer board. Pt's spouse extensively educated on follow-up therapy recommendations, w/c parts, set-up and sequencing of lateral scoot transfers using transfer board, and pt's L LE precautions. Therapist demonstrated R lateral scoot transfer with pt from EOB to w/c with spouse education on sequencing and proper amount of assist for patient. Pt performed supine to sit with min assist for L LE management and mod cuing for sequencing of task. Pt performed R lateral scoot transfer EOB to w/c using transfer board with total assist for transfer set-up and max assist for transfer board placement followed by close supervision for scooting hips into chair. Pt's wife demonstrated set-up for L lateral scoot transfer from w/c to EOB with max cuing from therapist for proper set-up, leg rest management, and placement of transfer board. Pt performed L lateral scoot transfer w/c to EOB using transfer board with pt's wife providing close supervision with mod cuing from therapist on positioning.Pt performed sit to supine with min assist for L LE management. Pt requesting a break due to increased fatigue. Pt's wife and therapist reviewed lateral scoot transfer with therapist playing role of patient to allow spouse additional practice for proper set-up and sequencing of the transfer - mod cuing provided throughout. Pt performed supine to  sit with wife providing proper min assist for L LE management with min cuing for technique from therapist - pt continues to require mod multimodal cuing from therapist to scoot to EOB. Pt's wife set-up EOB to w/c R lateral scoot transfer using transfer board with mod cuing from therapist. Pt performed R lateral scoot transfer EOB to w/c using transfer board with max assist for transfer board placement from wife and therapist - supervision provided from his wife with mod cuing from therapist for scooting fully into the chair. Therapist addressed pt's wife's questions regarding hands-on training this session and recommended additional training for pt/caregiver safety at home. Pt left sitting in w/c with needs in reach, seat belt alarm on, and pt's wife present.  Therapy Documentation Precautions:  Precautions Precautions: Fall Precaution Comments: maternity pads used to prevent [name] from rubbing Required Braces or Orthoses: Other Brace Other Brace: Left knee hinged brace locked in extension at all times. NO KNEE ROM Restrictions Weight Bearing Restrictions: No LLE Weight Bearing: Weight bearing as tolerated  Pain: Pt only reports L LE knee pain 1x during session, no additional details provided. Provided rest breaks throughout for pain management.   Therapy/Group: Individual Therapy  Tawana Scale, PT, DPT 02/26/2019, 7:56 AM

## 2019-02-27 ENCOUNTER — Inpatient Hospital Stay (HOSPITAL_COMMUNITY): Payer: Medicare Other | Admitting: Physical Therapy

## 2019-02-27 LAB — PROTIME-INR
INR: 2.9 — ABNORMAL HIGH (ref 0.8–1.2)
Prothrombin Time: 29.9 seconds — ABNORMAL HIGH (ref 11.4–15.2)

## 2019-02-27 MED ORDER — WARFARIN SODIUM 1 MG PO TABS
1.0000 mg | ORAL_TABLET | Freq: Once | ORAL | Status: AC
  Administered 2019-02-27: 18:00:00 1 mg via ORAL
  Filled 2019-02-27: qty 1

## 2019-02-27 NOTE — Progress Notes (Signed)
[facility] PHYSICAL MEDICINE & REHABILITATION PROGRESS NOTE  Subjective/Complaints: No new issues. Right knee remains tender.   ROS: Patient denies fever, rash, sore throat, blurred vision, nausea, vomiting, diarrhea, cough, shortness of breath or chest pain,   headache, or mood change. .  Objective: Vital Signs: Blood pressure 104/66, pulse 74, temperature 97.8 F (36.6 C), temperature source Oral, resp. rate 16, height 5\' 10"  (1.778 m), weight 89.9 kg, SpO2 98 %. No results found. No results for input(s): WBC, HGB, HCT, PLT in the last 72 hours. No results for input(s): NA, K, CL, CO2, GLUCOSE, BUN, CREATININE, CALCIUM in the last 72 hours.  Physical Exam: BP 104/66 (BP Location: Right Arm)   Pulse 74   Temp 97.8 F (36.6 C) (Oral)   Resp 16   Ht 5\' 10"  (1.778 m)   Wt 89.9 kg   SpO2 98%   BMI 28.44 kg/m  Constitutional: No distress . Vital signs reviewed. HEENT: EOMI, oral membranes moist Neck: supple Cardiovascular: IRR Respiratory: CTA Bilaterally without wheezes or rales. Normal effort    GI: BS +, non-tender, non-distended    Musculoskeletal: left knee tender Neurological: He is alert.  Patient remains pleasantly confused.  Oriented x person and hospital Follow simple commands Motor: Right lower extremity: 4+/5 proximal distal Left lower extremity: Hip flexion 2+/5, knee locked in brace, ankle dorsiflexion 4/5  Skin:  Left knee with immobilizer in place.  wound CDI Psychiatric:anxious   Assessment/Plan: 1. Functional deficits secondary to left knee patellar tendon rupture and meniscal debridement s/p repair which require 3+ hours per day of interdisciplinary therapy in a comprehensive inpatient rehab setting.  Physiatrist is providing close team supervision and 24 hour management of active medical problems listed below.  Physiatrist and rehab team continue to assess barriers to discharge/monitor patient progress toward functional and medical goals  Care  Tool:  Bathing  Bathing activity did not occur: Safety/medical concerns Body parts bathed by patient: Right arm, Left arm, Chest, Abdomen, Front perineal area, Right upper leg   Body parts bathed by helper: Right lower leg, Buttocks(pt refused to have L leg washed) Body parts n/a: Left upper leg, Left lower leg(LLE in knee immobilizer)   Bathing assist Assist Level: Moderate Assistance - Patient 50 - 74%     Upper Body Dressing/Undressing Upper body dressing   What is the patient wearing?: Pull over shirt    Upper body assist Assist Level: Supervision/Verbal cueing    Lower Body Dressing/Undressing Lower body dressing      What is the patient wearing?: Pants, Incontinence brief     Lower body assist Assist for lower body dressing: Total Assistance - Patient < 25%(supine rolling in bed)     Toileting Toileting Toileting Activity did not occur Landscape architect and hygiene only): (condom cath.)  Toileting assist Assist for toileting: Total Assistance - Patient < 25% Assistive Device Comment: urinal   Transfers Chair/bed transfer  Transfers assist     Chair/bed transfer assist level: Supervision/Verbal cueing(slide board with max assist for board placement)     Locomotion Ambulation   Ambulation assist   Ambulation activity did not occur: Safety/medical concerns          Walk 10 feet activity   Assist  Walk 10 feet activity did not occur: Safety/medical concerns        Walk 50 feet activity   Assist Walk 50 feet with 2 turns activity did not occur: Safety/medical concerns         Walk  150 feet activity   Assist Walk 150 feet activity did not occur: Safety/medical concerns         Walk 10 feet on uneven surface  activity   Assist Walk 10 feet on uneven surfaces activity did not occur: Safety/medical concerns         Wheelchair     Assist Will patient use wheelchair at discharge?: Yes Type of Wheelchair: Manual     Wheelchair assist level: Supervision/Verbal cueing Max wheelchair distance: 115ft    Wheelchair 50 feet with 2 turns activity    Assist        Assist Level: Supervision/Verbal cueing   Wheelchair 150 feet activity     Assist Wheelchair 150 feet activity did not occur: Refused   Assist Level: Supervision/Verbal cueing      Medical Problem List and Plan: 1.  Decreased functional mobility secondary to acute on chronic left knee pain status post repair of left patellar tendon rupture and scope debridement of meniscal tears 02-2019 and 02/13/2019. Weightbearing as tolerated with full knee extension brace must be on an full extension no knee range of motion. CIR PT ,OT Plan discharge 4/21   Brace at all times in locked, no weightbearing unless braces on 2.  Antithrombotics: -DVT/anticoagulation:  Chronic Coumadin  per pharmacy protocol             -antiplatelet therapy: N/A 3. Pain Management: hydrocodone as needed 4. Mood:             -antipsychotic agents: lithium 150 mg daily 5. Neuropsych: This patient is not capable of making decisions on his own behalf. 6. Skin/Wound Care:  Routine skin checks 7. Fluids/Electrolytes/Nutrition:  Routine ins and outs  BMP within acceptable range on 4/7 8. Acute on chronic anemia.   Hb 9.6 on 4/7, 9.2 on 4/10 stable, 8.7 on 4/12,--recheck Monday  -INR therapeutic, no gross blood 9. Atrial fibrillation. Cardizem 360 mg daily.   Monitor with increased mobility  Vitals:   02/26/19 1943 02/27/19 0316  BP: (!) 108/58 104/66  Pulse: 80 74  Resp: 16 16  Temp: 98 F (36.7 C) 97.8 F (36.6 C)  SpO2: 97% 98%  Heart rate is well controlled 4/18, watch for excessive hypotension   10. BPH. Pro scar 11. Hyperlipidemia. [place] 12. Hyperglycemia  Monitor with increased mobility 13. Leukocytosis resolved no need to monitor  14. Hypoalbuminemia  Supplement initiated on 4/7 15.  Hx dementia on Aricept- has difficulty following commands,  , needs cueing to perform MMT correctly,    LOS: 12 days A FACE TO Adrian 02/27/2019, 11:09 AM

## 2019-02-27 NOTE — Progress Notes (Signed)
ANTICOAGULATION CONSULT NOTE - Follow Up Consult  Pharmacy Consult for Coumadin Indication: Afib  No Known Allergies  Patient Measurements: Height: 5\' 10"  (177.8 cm) Weight: 198 lb 3.1 oz (89.9 kg) IBW/kg (Calculated) : 73  Vital Signs: Temp: 97.8 F (36.6 C) (04/18 0316) Temp Source: Oral (04/18 0316) BP: 104/66 (04/18 0316) Pulse Rate: 74 (04/18 0316)  Labs: Recent Labs    02/25/19 0526 02/26/19 0849 02/27/19 0533  LABPROT 27.6* 28.2* 29.9*  INR 2.6* 2.7* 2.9*    Estimated Creatinine Clearance: 77 mL/min (by C-G formula based on SCr of 0.95 mg/dL).   Assessment: Anticoag: Afib on PTA. Given vit K 5mg  IV 3/31. - INR 2.9 up - PTA Coumadin 5mg  daily exc 2.5mg  on Sun. Admit INR was 4.3   Goal of Therapy:  INR 2-3 Monitor platelets by anticoagulation protocol: Yes   Plan:  Coumadin 1mg  po x 1 tonight Daily INR   Tinika Bucknam S. Alford Highland, PharmD, BCPS Clinical Staff Pharmacist Eilene Ghazi Stillinger 02/27/2019,10:50 AM

## 2019-02-27 NOTE — Progress Notes (Signed)
Physical Therapy Session Note  Patient Details  Name: Russell Dillon MRN: [id number] Date of Birth: 1946/07/07  Today's Date: 02/27/2019 PT Individual Time: 3893-7342 PT Individual Time Calculation (min): 55 min   Short Term Goals: Week 2:  PT Short Term Goal 1 (Week 2): STG=LTG due to ELOS  Skilled Therapeutic Interventions/Progress Updates:   Pt received supine in bed and agreeable to therapy session with minimal encouragement. Pt performed supine to sit, HOB elevated and using bedrails, with min assist for L LE management and increased time. Therapist performed LB dressing EOB with max assist for time management. Pt performed sit<>stand from elevated EOB to RW with max assist for lifting and therapist pulled pants over hips total assist with min assist for balance while standing for ~10 seconds. Pt reporting increased L knee pain stating "a lot" of pain and requesting medicine - RN notified and present for pain medication administration. Pt performed R lateral scoot transfer EOB to w/c using transfer board, max assist with transfer board placement and supervision for scooting hips into w/c. Pt performed B UE w/c propulsion ~156ft x2 to/from gym with supervision and pt demonstrating ability to locate breaks and wheels more quickly with less cuing as well as improved w/c mobility around obstacles. Pt performed R LE kinetron from w/c focusing on strengthening at level 20cm/sec for 44minutes x2 with cuing for technique. Pt performed seated R LE long arch quads with 3lb weight 2 sets of 10 with visual demonstration and cuing for proper technique for strengthening. Pt propelled w/c back to room as described above. Performed R lateral scoot transfer w/c to EOB using transfer board, total assist for transfer set-up, max assist for board placement, and supervision scooting hips to bed. Performed sit to supine with min assist for L LE management. Pt left supine in bed with needs in reach and bed alarm on.    Therapy Documentation Precautions:  Precautions Precautions: Fall Precaution Comments: maternity pads used to prevent [name] from rubbing Required Braces or Orthoses: Other Brace Other Brace: Left knee hinged brace locked in extension at all times. NO KNEE ROM Restrictions Weight Bearing Restrictions: Yes LLE Weight Bearing: Weight bearing as tolerated  Therapy/Group: Individual Therapy  Tawana Scale, PT, DPT 02/27/2019, 4:03 PM

## 2019-02-28 ENCOUNTER — Inpatient Hospital Stay (HOSPITAL_COMMUNITY): Payer: Medicare Other | Admitting: Occupational Therapy

## 2019-02-28 LAB — CBC
HCT: 28.3 % — ABNORMAL LOW (ref 39.0–52.0)
Hemoglobin: 8.9 g/dL — ABNORMAL LOW (ref 13.0–17.0)
MCH: 28.2 pg (ref 26.0–34.0)
MCHC: 31.4 g/dL (ref 30.0–36.0)
MCV: 89.6 fL (ref 80.0–100.0)
Platelets: 302 10*3/uL (ref 150–400)
RBC: 3.16 MIL/uL — ABNORMAL LOW (ref 4.22–5.81)
RDW: 17.2 % — ABNORMAL HIGH (ref 11.5–15.5)
WBC: 6.2 10*3/uL (ref 4.0–10.5)
nRBC: 0 % (ref 0.0–0.2)

## 2019-02-28 LAB — PROTIME-INR
INR: 2.9 — ABNORMAL HIGH (ref 0.8–1.2)
Prothrombin Time: 29.5 seconds — ABNORMAL HIGH (ref 11.4–15.2)

## 2019-02-28 MED ORDER — WARFARIN SODIUM 2.5 MG PO TABS
2.5000 mg | ORAL_TABLET | Freq: Once | ORAL | Status: AC
  Administered 2019-02-28: 17:00:00 2.5 mg via ORAL
  Filled 2019-02-28: qty 1

## 2019-02-28 NOTE — Progress Notes (Signed)
Occupational Therapy Session Note  Patient Details  Name: Russell Dillon MRN: [id number] Date of Birth: 22-Aug-1946  Today's Date: 02/28/2019 OT Individual Time: 1245-1345 OT Individual Time Calculation (min): 60 min    Short Term Goals: Week 2:  OT Short Term Goal 1 (Week 2): Continue working on established goals at supervision to min assist level goals.   Skilled Therapeutic Interventions/Progress Updates:    PT seen for OT session forcusing on ADL re-training and UE strengthening in prep for functional task. Pt sitting up in bed upon arrival having just finished lunch and agreeable to tx session. Pt denied pain throughout session. He completed void from bed level with set-up of hand held urinal. Brief changed and LB dressing completed total A from bed level with pt using hospital bed functions to assist with rolling and therapist using chuck pad for more effective positioning of roll. He transferred to sitting EOB with supervision using hospital bed functions. UB bathing/dressing completed seated EOB with supervision, pt able to correctly orient folded shirt independently.  Completed supervision sliding board transfer to w/c with total A to place board and cuing throughout for hand placement and technique.  He self propelled w/c throughout unit with supervision. Completed sliding board transfer w/c<> EOM in same manner as described above. Difficulty following cues for lean in order to remove board himself.  Seated EOM, completed UE strengthening activity knocking ball back and forth with therapist using 4# dowel rod. Completed at supervision overall with VCs for anterior weightshift in order to maintain balance. Pt returned to room at end of session. With encouragement, pt willing to stay sitting up in w/c at end of session. Left seated with all needs in reach and chair belt alarm on. Re-oriented to call bell and use of channel change feature for TV.  Therapy Documentation Precautions:   Precautions Precautions: Fall Precaution Comments: maternity pads used to prevent [name] from rubbing Required Braces or Orthoses: Other Brace Other Brace: Left knee hinged brace locked in extension at all times. NO KNEE ROM Restrictions Weight Bearing Restrictions: Yes LLE Weight Bearing: Weight bearing as tolerated   Therapy/Group: Individual Therapy  Russell Dillon 02/28/2019, 6:52 AM

## 2019-02-28 NOTE — Progress Notes (Signed)
ANTICOAGULATION CONSULT NOTE - Follow Up Consult  Pharmacy Consult for Coumadin Indication: Afib  No Known Allergies  Patient Measurements: Height: 5\' 10"  (177.8 cm) Weight: 201 lb 8 oz (91.4 kg) IBW/kg (Calculated) : 73  Vital Signs: Temp: 98.2 F (36.8 C) (04/19 0449) Temp Source: Oral (04/19 0449) BP: 97/54 (04/19 0449) Pulse Rate: 56 (04/19 0449)  Labs: Recent Labs    02/26/19 0849 02/27/19 0533 02/28/19 0546  HGB  --   --  8.9*  HCT  --   --  28.3*  PLT  --   --  302  LABPROT 28.2* 29.9* 29.5*  INR 2.7* 2.9* 2.9*    Estimated Creatinine Clearance: 77.6 mL/min (by C-G formula based on SCr of 0.95 mg/dL).   Assessment: Anticoag: Afib on PTA. Given vit K 5mg  IV 3/31. - INR 2.9 trending up in generalbut stable overnight. Hgb 8.9 low but stable. Plts 302 have dropped significantly - PTA Coumadin 5mg  daily exc 2.5mg  on Sun. Admit INR was 4.3   Goal of Therapy:  INR 2-3 Monitor platelets by anticoagulation protocol: Yes   Plan:  Coumadin 2.5 mg po x 1 tonight Daily INR   Rai Severns S. Alford Highland, PharmD, BCPS Clinical Staff Pharmacist Eilene Ghazi Stillinger 02/28/2019,8:53 AM

## 2019-02-28 NOTE — Plan of Care (Signed)
  Problem: Consults Goal: RH GENERAL PATIENT EDUCATION Description See Patient Education module for education specifics. Outcome: Progressing   Problem: RH BLADDER ELIMINATION Goal: RH STG MANAGE BLADDER WITH ASSISTANCE Description STG Manage Bladder With min Assistance  Outcome: Progressing   Problem: RH SKIN INTEGRITY Goal: RH STG SKIN FREE OF INFECTION/BREAKDOWN Description Manage with min assist  Outcome: Progressing Goal: RH STG MAINTAIN SKIN INTEGRITY WITH ASSISTANCE Description STG Maintain Skin Integrity With min Assistance.  Outcome: Progressing Goal: RH STG ABLE TO PERFORM INCISION/WOUND CARE W/ASSISTANCE Description STG Able To Perform Incision/Wound Care With min  Assistance.  Outcome: Progressing   Problem: RH SAFETY Goal: RH STG ADHERE TO SAFETY PRECAUTIONS W/ASSISTANCE/DEVICE Description STG Adhere to Safety Precautions With Assistance/Device. Outcome: Progressing   Problem: RH PAIN MANAGEMENT Goal: RH STG PAIN MANAGED AT OR BELOW PT'S PAIN GOAL Description At or below level 4  Outcome: Progressing   Problem: RH KNOWLEDGE DEFICIT GENERAL Goal: RH STG INCREASE KNOWLEDGE OF SELF CARE AFTER HOSPITALIZATION Description Wife will be able to explain management of care at discharge independently using handouts/educational resources provided.  Outcome: Progressing

## 2019-02-28 NOTE — Progress Notes (Signed)
[place] PHYSICAL MEDICINE & REHABILITATION PROGRESS NOTE  Subjective/Complaints: No new complaints. Pain is well controlled. Appetite good  ROS: Patient denies fever, rash, sore throat, blurred vision, nausea, vomiting, diarrhea, cough, shortness of breath or chest pain,  back pain, headache, or mood change. .  Objective: Vital Signs: Blood pressure (!) 97/54, pulse (!) 56, temperature 98.2 F (36.8 C), temperature source Oral, resp. rate 14, height 5\' 10"  (1.778 m), weight 91.4 kg, SpO2 99 %. No results found. Recent Labs    02/28/19 0546  WBC 6.2  HGB 8.9*  HCT 28.3*  PLT 302   No results for input(s): NA, K, CL, CO2, GLUCOSE, BUN, CREATININE, CALCIUM in the last 72 hours.  Physical Exam: BP (!) 97/54 (BP Location: Right Arm) Comment: RN notified  Pulse (!) 56   Temp 98.2 F (36.8 C) (Oral)   Resp 14   Ht 5\' 10"  (1.778 m)   Wt 91.4 kg   SpO2 99%   BMI 28.91 kg/m  Constitutional: No distress . Vital signs reviewed. HEENT: EOMI, oral membranes moist Neck: supple Cardiovascular: IRR IRR Respiratory: CTA Bilaterally without wheezes or rales. Normal effort    GI: BS +, non-tender, non-distended  Musculoskeletal: left knee tender Neurological: He is alert.  Patient seems more oriented today  Oriented x person and hospital, reason he's here Follow simple commands Motor: Right lower extremity: 4+/5 proximal distal Left lower extremity: Hip flexion 2+/5, knee locked in brace, ankle dorsiflexion 4/5  Skin:  Left knee with immobilizer in place.  wound CDI Psychiatric:anxious   Assessment/Plan: 1. Functional deficits secondary to left knee patellar tendon rupture and meniscal debridement s/p repair which require 3+ hours per day of interdisciplinary therapy in a comprehensive inpatient rehab setting.  Physiatrist is providing close team supervision and 24 hour management of active medical problems listed below.  Physiatrist and rehab team continue to assess barriers  to discharge/monitor patient progress toward functional and medical goals  Care Tool:  Bathing  Bathing activity did not occur: Safety/medical concerns Body parts bathed by patient: Right arm, Left arm, Chest, Abdomen, Front perineal area, Right upper leg   Body parts bathed by helper: Right lower leg, Buttocks(pt refused to have L leg washed) Body parts n/a: Left upper leg, Left lower leg(LLE in knee immobilizer)   Bathing assist Assist Level: Moderate Assistance - Patient 50 - 74%     Upper Body Dressing/Undressing Upper body dressing   What is the patient wearing?: Pull over shirt    Upper body assist Assist Level: Supervision/Verbal cueing    Lower Body Dressing/Undressing Lower body dressing      What is the patient wearing?: Pants, Incontinence brief     Lower body assist Assist for lower body dressing: Total Assistance - Patient < 25%(supine rolling in bed)     Toileting Toileting Toileting Activity did not occur Landscape architect and hygiene only): (condom cath.)  Toileting assist Assist for toileting: Total Assistance - Patient < 25% Assistive Device Comment: urinal   Transfers Chair/bed transfer  Transfers assist     Chair/bed transfer assist level: Supervision/Verbal cueing(slide board with max assist for board placement)     Locomotion Ambulation   Ambulation assist   Ambulation activity did not occur: Safety/medical concerns          Walk 10 feet activity   Assist  Walk 10 feet activity did not occur: Safety/medical concerns        Walk 50 feet activity   Assist Walk 50  feet with 2 turns activity did not occur: Safety/medical concerns         Walk 150 feet activity   Assist Walk 150 feet activity did not occur: Safety/medical concerns         Walk 10 feet on uneven surface  activity   Assist Walk 10 feet on uneven surfaces activity did not occur: Safety/medical concerns         Wheelchair     Assist  Will patient use wheelchair at discharge?: Yes Type of Wheelchair: Manual    Wheelchair assist level: Supervision/Verbal cueing Max wheelchair distance: 19ft    Wheelchair 50 feet with 2 turns activity    Assist        Assist Level: Supervision/Verbal cueing   Wheelchair 150 feet activity     Assist Wheelchair 150 feet activity did not occur: Refused   Assist Level: Supervision/Verbal cueing      Medical Problem List and Plan: 1.  Decreased functional mobility secondary to acute on chronic left knee pain status post repair of left patellar tendon rupture and scope debridement of meniscal tears 02-2019 and 02/13/2019. Weightbearing as tolerated with full knee extension brace must be on an full extension no knee range of motion. CIR PT ,OT Plan discharge 4/21   Brace at all times in locked, no weightbearing unless braces on 2.  Antithrombotics: -DVT/anticoagulation:  Chronic Coumadin  per pharmacy protocol, INR 2.9             -antiplatelet therapy: N/A 3. Pain Management: hydrocodone as needed 4. Mood:             -antipsychotic agents: lithium 150 mg daily 5. Neuropsych: This patient is not capable of making decisions on his own behalf. 6. Skin/Wound Care:  Routine skin checks 7. Fluids/Electrolytes/Nutrition:  Routine ins and outs  BMP within acceptable range on 4/7 8. Acute on chronic anemia.   Hb 9.6 on 4/7, 9.2 on 4/10 stable, 8.7 on 4/12,--recheck Monday  -INR therapeutic, no gross blood 9. Atrial fibrillation. Cardizem 360 mg daily.   Monitor with increased mobility  Vitals:   02/27/19 2004 02/28/19 0449  BP: 113/78 (!) 97/54  Pulse: (!) 59 (!) 56  Resp: 17 14  Temp: 98.1 F (36.7 C) 98.2 F (36.8 C)  SpO2: 99% 99%  Heart rate is well controlled 4/19, watch for excessive hypotension   10. BPH. Pro scar 11. Hyperlipidemia. Lovaza 12. Hyperglycemia  Monitor with increased mobility 13. Leukocytosis resolved no need to monitor  14.  Hypoalbuminemia  Supplement initiated on 4/7 15.  Hx dementia on Aricept- has difficulty following commands, , needs cueing to perform MMT correctly,    LOS: 13 days A FACE TO [place] 02/28/2019, 10:56 AM

## 2019-03-01 ENCOUNTER — Inpatient Hospital Stay (HOSPITAL_COMMUNITY): Payer: Medicare Other | Admitting: Occupational Therapy

## 2019-03-01 ENCOUNTER — Inpatient Hospital Stay (HOSPITAL_COMMUNITY): Payer: Self-pay

## 2019-03-01 LAB — PROTIME-INR
INR: 2.7 — ABNORMAL HIGH (ref 0.8–1.2)
Prothrombin Time: 28.4 seconds — ABNORMAL HIGH (ref 11.4–15.2)

## 2019-03-01 NOTE — Progress Notes (Signed)
Occupational Therapy Discharge Summary  Patient Details  Name: Russell Dillon MRN: [id number] Date of Birth: Jul 22, 1946  Today's Date: 03/01/2019 OT Individual Time: 6945-0388 and 8280- 1358 OT Individual Time Calculation (min): 45 min and 41 min    Patient has met 8 of 8 long term goals due to improved activity tolerance, improved balance, postural control, ability to compensate for deficits, improved attention, improved awareness and improved coordination.  Patient to discharge at overall CGA slide board transfers, mod - max A LB self care, and supervision UB self care level.  Patient's care partner is independent to provide the necessary physical and cognitive assistance at discharge.  Extensive family education with wife in preparation for discharge.  Reasons goals not met: all goals met  Recommendation:  Patient will benefit from ongoing skilled OT services in home health setting to continue to advance functional skills in the area of BADL and Reduce care partner burden.  Equipment: hospital bed, drop arm commode chair, and slideboard  Reasons for discharge: treatment goals met  Patient/family agrees with progress made and goals achieved: Yes   OT Intervention:  Session 1: Upon entering the room, pt supine in bed and just finished breakfast. Pt is agreeable to OT intervention. Pt declines bathing but engaged in dressing while seated on EOB. Pt donning pull over shirt without assistance and needing max A to don pull over pants. Focus on lateral leans to pull pants over B hips with pt needing max cuing for technique. OT set up slide board and pt transferred to the L into wheelchair with steady assistance. Pt needing assistance to place B LEs onto leg rest. Pt propelled wheelchair to sink with encouragement and performed grooming tasks with set up A to obtain needed items. Pt remaining up in wheelchair with chair alarm belt donned. Call bell and all needed items within reach upon  exiting the room.   Session 2:  Upon entering the room, pt supine in bed and sleeping soundly. Pt with no c/o pain this session. Min A for supine >sit to EOB. OT assisted with placement of slide board and pt performed slide board into wheelchair with min guard for safety. Pt propelled wheelchair to ADL apartment. Pt verbalized he often sits in recliner chair at home and is agreeable to attempt slide board into rocking recliner chair. OT again assisted with placement of board and pt transferred onto recliner chair and back into wheelchair with min guard for safety. Pt continued to propel wheelchair back to room with supervision 150' and needing max cuing to locate room. Pt returning to bed in same manner as above. Sit >supine with OT providing assist for L LE. Bed alarm activated and call bell within reach upon exiting the room.   OT Discharge Precautions/Restrictions  Precautions Precautions: Fall Precaution Comments: maternity pads used to prevent [name] from rubbing Required Braces or Orthoses: Other Brace Other Brace: Left knee hinged brace locked in extension at all times. NO KNEE ROM Restrictions Weight Bearing Restrictions: Yes LLE Weight Bearing: Weight bearing as tolerated General   Vital Signs   Pain Pain Assessment Pain Scale: Faces Pain Score: 0-No pain Faces Pain Scale: Hurts a little bit Pain Type: Acute pain;Surgical pain Pain Location: Knee Pain Orientation: Left Pain Descriptors / Indicators: Aching;Discomfort Pain Onset: Gradual Patients Stated Pain Goal: 0 Pain Intervention(s): Repositioned Multiple Pain Sites: No ADL ADL Upper Body Bathing: Supervision/safety, Minimal cueing Where Assessed-Upper Body Bathing: Chair Lower Body Bathing: Dependent Where Assessed-Lower Body Bathing: Bed level  Upper Body Dressing: Moderate assistance Where Assessed-Upper Body Dressing: Chair Lower Body Dressing: Dependent Where Assessed-Lower Body Dressing: Bed level Toileting:  Dependent Where Assessed-Toileting: Bed level Toilet Transfer: Maximal assistance Toilet Transfer Method: (lateral scoot) Toilet Transfer Equipment: Drop arm bedside commode Vision Baseline Vision/History: Wears glasses Wears Glasses: At all times Patient Visual Report: No change from baseline Vision Assessment?: No apparent visual deficits Perception    Praxis   Cognition Overall Cognitive Status: History of cognitive impairments - at baseline Arousal/Alertness: Awake/alert Orientation Level: Oriented to person;Disoriented to time;Disoriented to place;Disoriented to situation Sensation Sensation Light Touch: Impaired Detail Light Touch Impaired Details: Impaired LLE Proprioception: Appears Intact Coordination Gross Motor Movements are Fluid and Coordinated: No Fine Motor Movements are Fluid and Coordinated: Yes Coordination and Movement Description: L knee precautions and brace impact gross motor movements of LEs Motor  Motor Motor - Discharge Observations: Impaired L LE strength and ROM Mobility  Bed Mobility Bed Mobility: Supine to Sit;Sit to Supine;Rolling Right;Rolling Left Rolling Right: Supervision/verbal cueing Rolling Left: Supervision/Verbal cueing Right Sidelying to Sit: Supervision/Verbal cueing Supine to Sit: Supervision/Verbal cueing Sit to Supine: Supervision/Verbal cueing  Trunk/Postural Assessment  Cervical Assessment Cervical Assessment: Within Functional Limits Thoracic Assessment Thoracic Assessment: Exceptions to WFL(kyphotic) Lumbar Assessment Lumbar Assessment: Exceptions to WFL(posterior pelvic tilt) Postural Control Postural Control: Within Functional Limits  Balance Balance Balance Assessed: Yes Static Sitting Balance Static Sitting - Level of Assistance: 5: Stand by assistance Dynamic Sitting Balance Dynamic Sitting - Level of Assistance: 5: Stand by assistance Static Standing Balance Static Standing - Level of Assistance: 3: Mod  assist Extremity/Trunk Assessment RUE Assessment RUE Assessment: Within Functional Limits LUE Assessment LUE Assessment: Within Functional Limits   [name] 03/01/2019, 1:52 PM

## 2019-03-01 NOTE — Progress Notes (Signed)
ANTICOAGULATION CONSULT NOTE - Follow Up Consult  Pharmacy Consult for Coumadin Indication: Afib  No Known Allergies  Patient Measurements: Height: 5\' 10"  (177.8 cm) Weight: 202 lb 9.6 oz (91.9 kg) IBW/kg (Calculated) : 73  Vital Signs: Temp: 98.1 F (36.7 C) (04/20 0500) Temp Source: Oral (04/20 0500) BP: 116/77 (04/20 0500) Pulse Rate: 74 (04/20 0500)  Labs: Recent Labs    02/27/19 0533 02/28/19 0546 03/01/19 0634  HGB  --  8.9*  --   HCT  --  28.3*  --   PLT  --  302  --   LABPROT 29.9* 29.5* 28.4*  INR 2.9* 2.9* 2.7*    Estimated Creatinine Clearance: 77.8 mL/min (by C-G formula based on SCr of 0.95 mg/dL).   Assessment: Anticoag: Afib on PTA. Given vit K 5mg  IV 3/31. INR 2.7  PTA Coumadin 5mg  daily exc 2.5mg  on Sun. Admit INR was 4.3   Goal of Therapy:  INR 2-3 Monitor platelets by anticoagulation protocol: Yes   Plan:  Repeat Coumadin 2.5 mg po x 1 tonight Daily INR  Thank you Anette Guarneri, PharmD [phone] 03/01/2019,11:29 AM

## 2019-03-01 NOTE — Progress Notes (Signed)
[place] PHYSICAL MEDICINE & REHABILITATION PROGRESS NOTE  Subjective/Complaints: Pt is aware of D/C date  ROS: Patient denies CP, SOB, N/V/D  Objective: Vital Signs: Blood pressure 116/77, pulse 74, temperature 98.1 F (36.7 C), temperature source Oral, resp. rate 16, height 5\' 10"  (1.778 m), weight 91.9 kg, SpO2 100 %. No results found. Recent Labs    02/28/19 0546  WBC 6.2  HGB 8.9*  HCT 28.3*  PLT 302   No results for input(s): NA, K, CL, CO2, GLUCOSE, BUN, CREATININE, CALCIUM in the last 72 hours.  Physical Exam: BP 116/77 (BP Location: Right Arm)   Pulse 74   Temp 98.1 F (36.7 C) (Oral)   Resp 16   Ht 5\' 10"  (1.778 m)   Wt 91.9 kg   SpO2 100%   BMI 29.07 kg/m  Constitutional: No distress . Vital signs reviewed. HEENT: EOMI, oral membranes moist Neck: supple Cardiovascular: IRR IRR Respiratory: CTA Bilaterally without wheezes or rales. Normal effort    GI: BS +, non-tender, non-distended  Musculoskeletal: left knee tender Neurological: He is alert.  Patient is more oriented today , also is more alert Oriented x person and hospital, reason he's here Follow simple commands Motor: Right lower extremity: 4+/5 proximal distal Left lower extremity: Hip flexion 2+/5, knee locked in brace, ankle dorsiflexion 4/5  Skin:  Left knee with immobilizer in place.  wound CDI Psychiatric:anxious   Assessment/Plan: 1. Functional deficits secondary to left knee patellar tendon rupture and meniscal debridement s/p repair which require 3+ hours per day of interdisciplinary therapy in a comprehensive inpatient rehab setting.  Physiatrist is providing close team supervision and 24 hour management of active medical problems listed below.  Physiatrist and rehab team continue to assess barriers to discharge/monitor patient progress toward functional and medical goals  Care Tool:  Bathing  Bathing activity did not occur: Safety/medical concerns Body parts bathed by patient:  Right arm, Left arm, Chest, Abdomen, Front perineal area, Right upper leg   Body parts bathed by helper: Right lower leg, Buttocks(pt refused to have L leg washed) Body parts n/a: Left upper leg, Left lower leg(LLE in knee immobilizer)   Bathing assist Assist Level: Moderate Assistance - Patient 50 - 74%     Upper Body Dressing/Undressing Upper body dressing   What is the patient wearing?: Pull over shirt    Upper body assist Assist Level: Set up assist    Lower Body Dressing/Undressing Lower body dressing      What is the patient wearing?: Pants, Incontinence brief     Lower body assist Assist for lower body dressing: Total Assistance - Patient < 25%     Toileting Toileting Toileting Activity did not occur Landscape architect and hygiene only): (condom cath.)  Toileting assist Assist for toileting: Total Assistance - Patient < 25% Assistive Device Comment: urinal   Transfers Chair/bed transfer  Transfers assist     Chair/bed transfer assist level: Supervision/Verbal cueing(slide board with max assist for board placement)     Locomotion Ambulation   Ambulation assist   Ambulation activity did not occur: Safety/medical concerns          Walk 10 feet activity   Assist  Walk 10 feet activity did not occur: Safety/medical concerns        Walk 50 feet activity   Assist Walk 50 feet with 2 turns activity did not occur: Safety/medical concerns         Walk 150 feet activity   Assist Walk 150 feet  activity did not occur: Safety/medical concerns         Walk 10 feet on uneven surface  activity   Assist Walk 10 feet on uneven surfaces activity did not occur: Safety/medical concerns         Wheelchair     Assist Will patient use wheelchair at discharge?: Yes Type of Wheelchair: Manual    Wheelchair assist level: Supervision/Verbal cueing Max wheelchair distance: 14ft    Wheelchair 50 feet with 2 turns  activity    Assist        Assist Level: Supervision/Verbal cueing   Wheelchair 150 feet activity     Assist Wheelchair 150 feet activity did not occur: Refused   Assist Level: Supervision/Verbal cueing      Medical Problem List and Plan: 1.  Decreased functional mobility secondary to acute on chronic left knee pain status post repair of left patellar tendon rupture and scope debridement of meniscal tears 02-2019 and 02/13/2019. Weightbearing as tolerated with full knee extension brace must be on an full extension no knee range of motion. CIR PT ,OT Plan discharge in am   Brace at all times in locked, no weightbearing unless braces on 2.  Antithrombotics: -DVT/anticoagulation:  Chronic Coumadin  per pharmacy protocol, INR 2.7             -antiplatelet therapy: N/A 3. Pain Management: hydrocodone as needed 4. Mood:             -antipsychotic agents: lithium 150 mg daily 5. Neuropsych: This patient is not capable of making decisions on his own behalf. 6. Skin/Wound Care:  Routine skin checks 7. Fluids/Electrolytes/Nutrition:  Routine ins and outs  BMP within acceptable range on 4/7 8. Acute on chronic anemia.   Hb 9.6 on 4/7, 9.2 on 4/10 stable, 8.7 on 4/12,-8.9 on 4/20  -INR therapeutic, no gross blood 9. Atrial fibrillation. Cardizem 360 mg daily.   Monitor with increased mobility  Vitals:   02/28/19 2009 03/01/19 0500  BP: 105/66 116/77  Pulse: 76 74  Resp: 18 16  Temp: 98.1 F (36.7 C) 98.1 F (36.7 C)  SpO2: 100% 100%  Heart rate is well controlled 4/20 watch for excessive hypotension   10. BPH. Pro scar 11. Hyperlipidemia. Lovaza 12. Hyperglycemia  Monitor with increased mobility   14. Hypoalbuminemia  Supplement initiated on 4/7 15.  Hx dementia on Aricept- has difficulty following commands, , needs cueing to perform MMT correctly,    LOS: 14 days A FACE TO [place] E Kirsteins 03/01/2019, 8:45 AM

## 2019-03-01 NOTE — Progress Notes (Signed)
Occupational Therapy Session Note  Patient Details  Name: Russell Dillon MRN: [id number] Date of Birth: 1946/09/21  Today's Date: 03/01/2019 OT Individual Time: 1000-1100 OT Individual Time Calculation (min): 60 min    Short Term Goals: Week 1:  OT Short Term Goal 1 (Week 1): Pt will complete sit > stand with mod assist to decrease burden of care with LB bathing/dressing OT Short Term Goal 1 - Progress (Week 1): Met OT Short Term Goal 2 (Week 1): Pt will complete LB dressing (excluding foot wear) with mod assist OT Short Term Goal 2 - Progress (Week 1): Not met OT Short Term Goal 3 (Week 1): Pt will complete bathing with mod assist OT Short Term Goal 3 - Progress (Week 1): Not met OT Short Term Goal 4 (Week 1): Pt will complete toilet transfer with mod assist OT Short Term Goal 4 - Progress (Week 1): Not met Week 2:  OT Short Term Goal 1 (Week 2): Continue working on established goals at supervision to min assist level goals.      Skilled Therapeutic Interventions/Progress Updates:    Pt seen this session with his spouse for family education to focus on transfers and mobility needed for his self care.  She stated she had learned some techniques in a previous session but realized she forgot some things quickly so she wanted to video record the session. Had spouse video session with step by step directions while I demonstrated.  In the next PT session, she will practice hands on transfers and managing the w/c.    Demonstrated with specific instructions: -managing w/c leg extensions, placing on and off - braking w/c -positioning w/c at bed to prepare for transfer -placement of sliding board and importance of him having pants on (demonstrated how to transfer with towel of board if pt was not dressed) -slide board with pt moving wc to bed to his R side (pt only needed verbal cues and set up A to keep board stabilized) -safety precautions with board placement and hand placement - pt  moved into supine in bed with min A/S for LLE -rolling techniques during LB bathing, dressing, placement of Bedpan -actually placed a bedpan under pt so she could see the positioning -reviewed cleansing strategies, use of a disposable pad under pan -supine to sit with CGA for LLE -transfer to w/c to his R side (S/ set up)  Discussed at length why she needs to do his toileting, bathing, dressing from bed level (for her safety), but that with [facility] therapies pt should be working towards a squat pivot to [facility] and sit to stand.  She understood and agreed this was safest.   Pt tolerated all of the mobility and transfers well.  Pt resting in w/c with belt alarm on and all needs met.   Therapy Documentation Precautions:  Precautions Precautions: Fall Precaution Comments: maternity pads used to prevent [name] from rubbing Required Braces or Orthoses: Other Brace Other Brace: Left knee hinged brace locked in extension at all times. NO KNEE ROM Restrictions Weight Bearing Restrictions: Yes LLE Weight Bearing: Weight bearing as tolerated       Pain: Pain Assessment Pain Scale: Faces Pain Score: 0-No pain Faces Pain Scale: Hurts a little bit Pain Type: Acute pain;Surgical pain Pain Location: Knee Pain Orientation: Left Pain Descriptors / Indicators: Aching;Discomfort Pain Frequency: Intermittent Pain Onset: Gradual Patients Stated Pain Goal: 0 Pain Intervention(s): Medication (See eMAR)   Therapy/Group: Individual Therapy  [place] 03/01/2019, 12:37 PM

## 2019-03-01 NOTE — Progress Notes (Signed)
Physical Therapy Discharge Summary  Patient Details  Name: HANNA AULTMAN MRN: [id number] Date of Birth: [birth year]  Today's Date: 03/01/2019 PT Individual Time: 1130-1230 PT Individual Time Calculation (min): 60 min    Patient has met 7 of 8 long term goals due to improved activity tolerance, improved balance, decreased pain and ability to compensate for deficits.  Patient to discharge at a wheelchair level [place].   Patient's care partner is independent to provide the necessary physical assistance at discharge. Pt's wife has attended multiple family education/training sessions, and has been provided with education on transfers techniques. Pt's wife has also performed bed<>chair slideboard transfer, w/c parts management and bed mobility with the pt. Standing only recommended with home health therapy.   Reasons goals not met: Pt did not meet car transfer goal as he will being going home with transportation services that are w/c accessible.   Recommendation:  Patient will benefit from ongoing skilled PT services in home health setting to continue to advance safe functional mobility, address ongoing impairments in balance, strength, R LE ROM, and minimize fall risk.  Equipment: w/c, slideboard, RW and hospital bed  Reasons for discharge: treatment goals met and discharge from hospital  Patient/family agrees with progress made and goals achieved: Yes   PT treatment Interventions: Pt seated in w/c upon PT arrival, agreeable to therapy tx and denies pain. Pt's wife present for family education this session. Pt performed slideboard transfer with set up assist from wife, therapist provided cues throughout for proper transfer set up and w/c parts management. Pt's wife has video of transfer with slideboard to refer back to at home. Pt performed slideboard transfer w/c<>bed with CGA. Pt performed bed mobility this session supine<>sitting with supervision, therapist educating pt's wife on  techniques and letting the pt do as much as possible without assist. Pt performed w/c propulsion with supervision x 150 ft, requires max cues for w/c parts management and sequencing. Therapist not recommending any standing at home, will defer standing to home health therapy only. Pt is able to stand from elevated surfaces with mod assist and RW, max cues. Pt left supine in bed at end of session, bed alarm set and needs in reach. Therapist recommended that the wife look into hiring at home aid for additional help if needed, provided with list of agencies to contact.    PT Discharge Precautions/Restrictions Precautions Precautions: Fall Required Braces or Orthoses: Other Brace Other Brace: Left knee hinged brace locked in extension at all times. NO KNEE ROM Restrictions Weight Bearing Restrictions: Yes LLE Weight Bearing: Weight bearing as tolerated Vital Signs Therapy Vitals Temp: 98.1 F (36.7 C) Temp Source: Oral Pulse Rate: 74 Resp: 16 BP: 116/77 Patient Position (if appropriate): Lying Oxygen Therapy SpO2: 100 % O2 Device: Room Air Pain Denies pain at rest Cognition Overall Cognitive Status: History of cognitive impairments - at baseline Arousal/Alertness: Awake/alert Orientation Level: Oriented to person;Disoriented to time;Disoriented to place;Disoriented to situation Focused Attention: Appears intact Memory: Impaired Awareness: Impaired Problem Solving: Impaired Safety/Judgment: Appears intact Comments: per chart review, wife reports early dementia Sensation Sensation Light Touch: Impaired Detail Light Touch Impaired Details: Impaired LLE Proprioception: Appears Intact Coordination Gross Motor Movements are Fluid and Coordinated: No Fine Motor Movements are Fluid and Coordinated: Yes Coordination and Movement Description: L knee precautions and brace impact gross motor movements of LEs Motor  Motor Motor - Discharge Observations: Impaired L LE strength and ROM   Mobility Bed Mobility Bed Mobility: Supine to Sit;Sit to Supine;Rolling  Right;Rolling Left Rolling Right: Supervision/verbal cueing Rolling Left: Supervision/Verbal cueing Right Sidelying to Sit: Supervision/Verbal cueing Supine to Sit: Supervision/Verbal cueing Sit to Supine: Supervision/Verbal cueing Transfers Transfers: Lateral/Scoot Transfers Lateral/Scoot Transfers: Contact Guard/Touching assist Transfer (Assistive device): Other (Comment)(slideboard) Locomotion  Gait Ambulation: No Gait Gait: No Stairs / Additional Locomotion Stairs: No Wheelchair Mobility Wheelchair Mobility: Yes Wheelchair Assistance: Chartered loss adjuster: Both upper extremities Wheelchair Parts Management: Supervision/cueing  Trunk/Postural Assessment  Cervical Assessment Cervical Assessment: Within Water engineer Thoracic Assessment: Exceptions to WFL(kyphotic and rounded shoulders) Lumbar Assessment Lumbar Assessment: Exceptions to WFL(posterior pelvic tilt) Postural Control Postural Control: Within Functional Limits  Balance Balance Balance Assessed: Yes Static Sitting Balance Static Sitting - Level of Assistance: 5: Stand by assistance Dynamic Sitting Balance Dynamic Sitting - Level of Assistance: 5: Stand by assistance Static Standing Balance Static Standing - Level of Assistance: 3: Mod assist Extremity Assessment  RLE Assessment RLE Assessment: Within Functional Limits LLE Assessment LLE Assessment: Exceptions to [facility] General Strength Comments: Unable to formally assess knee strength 2/2 precautions; demonstrates active hip and ankle movement against gravity demonstrating at least a 3/5 strength    Netta Corrigan, PT, DPT 03/01/2019, 8:00 AM

## 2019-03-01 NOTE — Progress Notes (Signed)
Social Work  Discharge Note  The overall goal for the admission was met for:   Discharge location: Yes-HOME WITH WIFE WHO HAS BEEN EDUCATED AND CAN PROVIDE 24 HR CARE-WITH HELP  Length of Stay: Yes-14 DAYS  Discharge activity level: Yes-MIN ASSIST WHEELCHAIR LEVEL  Home/community participation: Yes  Services provided included: MD, RD, PT, OT, RN, CM, Pharmacy and SW  Financial Services: Medicare and Private Insurance: [place]  Follow-up services arranged: Home Health: [place], RN, DME: [facility] BED-[facility], 30 TRANSFER BOARD, 3 IN 1, and Patient/Family request agency HH: [facility], DME: [facility]  Comments (or additional information):WIFE HERE ON CONSECUTIVE DAYS FOR EDUCATION, PT WILL BE MUCH CARE FOR WIFE AND FEEL THIS MAY NOT [place]. ENCOURAGED WIFE TO HIRE ASSISTANCE TO SUPPORT HER. HAS LTC POLICY KICKS IN 035 DAYS. OFFERED NHP AND BOTH REFUSED WIFE PROMISED HIM SHE WOULD TRY AT HOME FIRST  Patient/Family verbalized understanding of follow-up arrangements: Yes  Individual responsible for coordination of the follow-up plan: [name]-WIFE  Confirmed correct DME delivered: Elease Hashimoto 03/01/2019    Elease Hashimoto

## 2019-03-01 NOTE — Discharge Summary (Signed)
Physician Discharge Summary  Patient ID: Russell Dillon MRN: [id number] DOB/AGE: [birth year] 73 y.o.  Admit date: 02/15/2019 Discharge date: 03/02/2019  Discharge Diagnoses:  Active Problems:   Rupture of left patellar tendon   Hypoalbuminemia   Leukocytosis   Hyperglycemia   Atrial fibrillation (HCC)   Acute on chronic anemia BPH Hyperlipidemia History of memory deficits  Discharged Condition: stable  Significant Diagnostic Studies: Ct Head Wo Contrast  Result Date: 02/08/2019 CLINICAL DATA:  Altered level of consciousness EXAM: CT HEAD WITHOUT CONTRAST TECHNIQUE: Contiguous axial images were obtained from the base of the skull through the vertex without intravenous contrast. COMPARISON:  None. FINDINGS: Brain: No evidence of acute infarction, hemorrhage, extra-axial collection, ventriculomegaly, or mass effect. Generalized cerebral atrophy. Periventricular white matter low attenuation likely secondary to microangiopathy. Vascular: Cerebrovascular atherosclerotic calcifications are noted. Skull: Negative for fracture or focal lesion. Sinuses/Orbits: Visualized portions of the orbits are unremarkable. Visualized portions of the paranasal sinuses are unremarkable. Visualized portions of the mastoid air cells are unremarkable. Other: None. IMPRESSION: 1. No acute intracranial pathology. 2. Chronic microvascular disease and cerebral atrophy. Electronically Signed   By: Kathreen Devoid   On: 02/08/2019 16:18   Ct Abdomen Pelvis W Contrast  Result Date: 02/08/2019 CLINICAL DATA:  Abdominal pain EXAM: CT ABDOMEN AND PELVIS WITH CONTRAST TECHNIQUE: Multidetector CT imaging of the abdomen and pelvis was performed using the standard protocol following bolus administration of intravenous contrast. CONTRAST:  131mL OMNIPAQUE IOHEXOL 300 MG/ML  SOLN COMPARISON:  07/08/2013 FINDINGS: LOWER CHEST: Enlarged left atrium. No pleural effusion. Right basilar atelectasis. HEPATOBILIARY: The hepatic contours  and density are normal. There is no intra- or extrahepatic biliary dilatation. There is cholelithiasis without acute inflammation. Focal fatty deposition along the gallbladder fossa. PANCREAS: The pancreatic parenchymal contours are normal and there is no ductal dilatation. There is no peripancreatic fluid collection. SPLEEN: Normal. ADRENALS/URINARY TRACT: --Adrenal glands: Normal. --Right kidney/ureter: No hydronephrosis, nephroureterolithiasis, perinephric stranding or solid renal mass. --Left kidney/ureter: No hydronephrosis, nephroureterolithiasis, perinephric stranding or solid renal mass. --Urinary bladder: Normal for degree of distention STOMACH/BOWEL: --Stomach/Duodenum: There is no hiatal hernia or other gastric abnormality. The duodenal course and caliber are normal. --Small bowel: No dilatation or inflammation. --Colon: No focal abnormality. --Appendix: Not visualized. No right lower quadrant inflammation or free fluid. VASCULAR/LYMPHATIC: There is aortic atherosclerosis without hemodynamically significant stenosis. The portal vein, splenic vein, superior mesenteric vein and IVC are patent. No abdominal or pelvic lymphadenopathy. REPRODUCTIVE: Enlarged prostate measures 7.8 cm in transverse dimension. MUSCULOSKELETAL. 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 OTHER: None. IMPRESSION: 1. No acute abnormality of the abdomen or pelvis. 2. Cholelithiasis without acute inflammation. 3. Markedly enlarged prostate. 4. 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 Electronically Signed   By: Ulyses Jarred M.D.   On: 02/08/2019 16:58   Mr Knee Left W Wo Contrast  Result Date: 02/09/2019 CLINICAL DATA:  He has significant left knee pain, however it appears bruised. He does not remember fall. Wife reports he hit his left knee while at church 2 weeks ago and had abrasion and pain. EXAM: MRI OF THE LEFT KNEE  WITHOUT AND WITH CONTRAST TECHNIQUE: Multiplanar, multisequence MR imaging of the left knee was performed both before and after administration of intravenous contrast. CONTRAST:  9 mL Gadavist COMPARISON:  None. FINDINGS: MENISCI Medial meniscus: Horizontal tear of the posterior horn of the medial meniscus extend to the free edge. Horizontal tear of  the anterior horn-body junction of medial meniscus extending to the superior articular surface. Lateral meniscus: Complex tear of the anterior horn of lateral meniscus. LIGAMENTS Cruciates: Intact ACL. Mild increased signal in the PCL concerning for PCL strain. Collaterals: Medial collateral ligament is intact. Lateral collateral ligament complex is intact. CARTILAGE Patellofemoral: Partial-thickness cartilage loss of the patellofemoral compartment with subchondral reactive marrow changes in the patellar apex. Medial: Partial-thickness cartilage loss of medial femorotibial compartment. Lateral: Mild partial-thickness cartilage loss of the lateral femorotibial compartment. Joint: Moderate joint effusion with areas of T1 hyperintensity likely reflecting hemarthrosis. Smooth synovial enhancement. Edema in Hoffa's fat. Popliteal Fossa: Small hemorrhagic Baker's cyst. Intact popliteus tendon. Extensor Mechanism: Intact quadriceps tendon. Severe thickening and increased signal of the patellar tendon most consistent with severe tendinosis with a high-grade partial thickness, near complete, tear at the patellar tendon insertion with a few medial fibers remaining intact and a 1.4 x 3.6 cm hematoma at the patellar tendon insertion. Avulsed bony fragment is noted along the distal and of the patellar tendon tear avulsed from the tibial tuberosity. Intact medial patellar retinaculum. Intact lateral patellar retinaculum. Intact MPFL. Bones: Severe marrow edema in the anterolateral tibial plateau with cortical irregularity concerning for a nondisplaced fracture which is adjacent to the  avulsed tibial tubercle. Marrow edema in the anterolateral femoral condyle with peripheral cortical disruption concerning for a nondisplaced fracture. Adjacent to the area of cortical disruption there is a 14 mm fluid collection peripherally deep to the fibular collateral ligament and adjacent to the popliteus tendon concerning for a small hematoma. Other: Mild muscle edema in the medial gastrocnemius muscle and to lesser extent lateral gastrocnemius muscle most concerning for mild muscle strain with a small 11 mm intramuscular hematoma in the medial gastrocnemius muscle. Diffuse soft Dillon edema around the knee within the subcutaneous fat likely reactive versus less likely secondary to cellulitis. Prepatellar soft Dillon edema with a thin intermediate density fluid collection likely reflecting hemorrhagic bursitis. IMPRESSION: 1. Severe marrow edema in the anterolateral tibial plateau with cortical irregularity concerning for a nondisplaced fracture which is adjacent to the avulsed tibial tubercle. Marrow edema in the anterolateral femoral condyle with peripheral cortical disruption concerning for a nondisplaced fracture. Adjacent to the area of cortical disruption of the lateral femoral condyle there is a 14 mm fluid collection peripherally deep to the fibular collateral ligament and adjacent to the popliteus tendon concerning for a small hematoma. This overall appearance can be seen with hyperextension injury versus direct trauma. 2. Mild increased signal in the PCL concerning for PCL strain. 3. Moderate size hemarthrosis with smooth synovial enhancement. This is likely posttraumatic and inflammatory in nature, but secondarily infected fluid can not be excluded. 4. Severe tendinosis of the patellar tendon most consistent with with a high-grade partial thickness, near complete, tear at the patellar tendon insertion with a few medial fibers remaining intact and a 1.4 x 3.6 cm hematoma at the patellar tendon  insertion. Avulsed bony fragment is noted along the distal and of the patellar tendon tear avulsed from the tibial tuberosity. This appearance can be seen in the setting of gout. 5. Prepatellar soft Dillon edema with a thin intermediate density fluid collection likely reflecting hemorrhagic bursitis. 6. Tricompartmental cartilage abnormalities as described above most consistent with mild osteoarthritis. 7. Horizontal tear of the posterior horn of the medial meniscus extend to the free edge. Horizontal tear of the anterior horn-body junction of medial meniscus extending to the superior articular surface. 8. Complex tear of the anterior  horn of lateral meniscus. Electronically Signed   By: Kathreen Devoid   On: 02/09/2019 08:24   Dg Chest Port 1 View  Result Date: 02/08/2019 CLINICAL DATA:  Fever, altered mental status, leukocytosis. EXAM: PORTABLE CHEST 1 VIEW COMPARISON:  Chest x-ray dated July 10, 2015. FINDINGS: The heart size and mediastinal contours are within normal limits. Atherosclerotic calcification of the aortic arch. Normal pulmonary vascularity. No focal consolidation, pleural effusion, or pneumothorax. No acute osseous abnormality. IMPRESSION: No active disease. Electronically Signed   By: Titus Dubin M.D.   On: 02/08/2019 14:23   Dg Knee Complete 4 Views Left  Result Date: 02/08/2019 CLINICAL DATA:  Pain in both legs.  Elevated white count and fever. EXAM: LEFT KNEE - COMPLETE 4+ VIEW COMPARISON:  12/20/2009 FINDINGS: Small suprapatellar joint effusion identified. There is anterior soft Dillon swelling with edema subcutaneous anterior to the knee. Interval avulsion of the tibial tuberosity with possible osteolysis involving the undersurface of the lower pole of patella and tibial tubercle. Mild tricompartment osteoarthritis and chondrocalcinosis noted. IMPRESSION: 1. There has been interval avulsion of the tibial tubercle with suspected osteolysis involving the lower pole of patella and  tibial tubercle. Overlying soft Dillon swelling and small joint effusion is noted. Cannot rule out underlying osteomyelitis. Recommend further evaluation with contrast enhanced MR of the knee. Electronically Signed   By: Kerby Moors M.D.   On: 02/08/2019 19:53   Dg Knee Left Port  Result Date: 02/11/2019 CLINICAL DATA:  Status post surgery for patellar tendon rupture. EXAM: PORTABLE LEFT KNEE - 1-2 VIEW COMPARISON:  None. FINDINGS: A prominent effusion is present. There is gas in the effusion, consistent with recent surgery. The patella is repositioned. Tibial tuberosity is reattached. Vacuum device is in place. IMPRESSION: Postsurgical changes of the left knee without radiographic evidence for complication. Electronically Signed   By: San Morelle M.D.   On: 02/11/2019 13:46    Labs:  Basic Metabolic Panel: No results for input(s): NA, K, CL, CO2, GLUCOSE, BUN, CREATININE, CALCIUM, MG, PHOS in the last 168 hours.  CBC: Recent Labs  Lab 02/28/19 0546  WBC 6.2  HGB 8.9*  HCT 28.3*  MCV 89.6  PLT 302    CBG: Recent Labs  Lab 02/25/19 1822  GLUCAP 106*   Family history. Mother with myocardial infarction hypertension. Father with diabetes and CAD. Negative for colon cancer or stomach cancer.  Brief HPI:    Russell Dillon is a 73 year old right-handed male with history of AVR, atrial fibrillation with chronic Coumadin, bipolar disorder maintained on lithium, mild cognitive impairments maintained on Aricept. Per chart review lives with spouse independent up until approximately 2 weeks ago with increasing knee pain. Presented 01/30/2019 with increasing knee pain over the past 10 days in recent fall. Patient with low-grade fever. Hemoglobin on admission of 4, INR 4.3, B BC 18,400, BNP 220, lactic acid 1.8, urinalysis negative. Cranial CT scan negative for acute changes as well as CT abdomen pelvis negative. Initially placed on broad-spectrum antibiotics. Coumadin was reversed with  vitamin K. Left knee tap revealed [place] [address] arthropathy due to gout. MRI showed bilateral meniscus tear, left patella tendon tear with lateral distal femur and tibial plateau nondisplaced fractures. Patient underwent left knee patella tendon repair, arthroscopy left knee with partial medial and lateral menisc with wound VAC application 09/07/2535 and return to the OR for patella tendon repair meniscectomies 02/13/2019. Weightbearing as tolerated left knee hinged brace locked in extension at all times. No knee range of  motion. Initially maintained on antibiotic therapy for suspect sepsis later discontinued close monitoring for any fever. Chronic Coumadin later resumed hemoglobin remained stable. Therapy evaluations completed and patient was admitted for a compress of rehabilitation program.  Hospital Course: Russell Dillon was admitted to rehab 02/15/2019 for inpatient therapies to consist of PT, ST and OT at least three hours five days a week. Past admission physiatrist, therapy team and rehab RN have worked together to provide customized collaborative inpatient rehab. Pertaining to Mr. Bonney Leitz decreased functional mobility secondary to acute on chronic left knee pain status post repair left patella tendon rupture and scope debridement of meniscal tears he remain weightbearing as tolerated full extension brace plan follow-up orthopedic services. Chronic Coumadin ongoing no bleeding episodes a home health nurse had been arranged  for Coumadin draws with results to [facility]. Pain management with the use of hydrocodone close monitoring. Patient remained on Aricept as prior to admission. He could resume his lithium at home as prior to admission. Acute on chronic anemia stable no bleeding episodes. Cardiac rate remained controlled and continued on diltiazem.  Physical exam. Blood pressure 108/57 pulse 70 temperature 98.3 respirations 15 oxygen saturation is 94% room air Constitutional.  Well-developed well-nourished HEENT Head. Normocephalic and atraumatic Eyes. EOMs normal no discharge without nystagmus Respiratory effort normal without wheeze or rails. Good inspiratory effort Cardiac regular rate without murmur or gallop GI. Nondistention nontender without rebound Musculoskeletal. Left lower extremity with edema and tenderness Neurological. Alert pleasantly confused follows commands. Right lower extremity 4 out of 5 proximal to distal left lower extremity hip flexion 2 out of 5 knee locked in brace ankle dorsi flexion 4 out of 5  Rehab course: During patient's stay in rehab weekly team conferences were held to monitor patient's progress, set goals and discuss barriers to discharge. At admission, patient required moderate assist to ambulate a few steps with rolling walker. Max assist sit to supine, minimal assist supine to sit. Moderate assist upper body bathing, max assist lower body bathing, moderate assist upper body dressing, total assist lower body dressing. +2 physical assist toilet transfers  He  has had improvement in activity tolerance, balance, postural control as well as ability to compensate for deficits. He/She has had improvement in functional use RUE/LUE  and RLE/LLE as well as improvement in awareness. Working with energy conservation techniques and safety awareness. Patient performed supine to sit, head of bed elevated using bed rail with minimal assist for left lower extremity management and increased time. Perform sit to stand from elevated edge of bed to rolling walker with max assist. Propel his wheelchair with supervision. Performed right lateral scoot transfers wheelchair to edge of bed using transfer board total assist for transfers set up max assist for board placement and supervision scooting hips to bed. He completed void from bed level with set up of hand held urinal. Transferred to sitting edge of bed with supervision using hospital bed functions with  occupational therapy. Upper body bathing dressing completed seated edge of bed with supervision. Family teaching completed plan discharge to home       Disposition:  discharge to home   Diet:regular  Special Instructions: Weightbearing as tolerated with knee brace locked in extension. No active range of motion  Home health nurse to check INR on 03/04/2019 results to [facility] [phone] fax number [phone]  MEDS at discharge 1.Tylenol as needed 2. Cardizem 360 mg daily 3.Aricept 10 mg QD 4.proscar 5 mg QD  5.Hydrocodone 5-325 mg 1-2 tabs every 4 hours as needed pain 6.Lithium 150 mg QHS 7 Coumadin 2.5 mg daily  Follow-up Information    Jamse Arn, MD Follow up.   Specialty:  Physical Medicine and Rehabilitation Why:  office to call for appointment Contact information: [address] [place] [address] [phone]        Altamese Mishawaka, MD Follow up.   Specialty:  Orthopedic Surgery Why:  call for appointment 2 weeks Contact information: [place] [address] [phone]        Nahser, Wonda Cheng, MD Follow up.   Specialty:  Cardiology Why:  call for appointment Contact information: [place] [address] [phone]           Signed: Lavon Paganini [place] 03/01/2019, 5:59 AM

## 2019-03-02 LAB — PROTIME-INR
INR: 2.5 — ABNORMAL HIGH (ref 0.8–1.2)
Prothrombin Time: 26.9 seconds — ABNORMAL HIGH (ref 11.4–15.2)

## 2019-03-02 MED ORDER — DONEPEZIL HCL 10 MG PO TABS: 10.0000 mg | ORAL_TABLET | Freq: Every day | ORAL | 3 refills | Status: AC

## 2019-03-02 MED ORDER — DILTIAZEM HCL ER COATED BEADS 360 MG PO CP24: 360.0000 mg | ORAL_CAPSULE | Freq: Every day | ORAL | 3 refills | Status: DC

## 2019-03-02 MED ORDER — HYDROCODONE-ACETAMINOPHEN 5-325 MG PO TABS: 1.0000 | ORAL_TABLET | ORAL | 0 refills | Status: DC | PRN

## 2019-03-02 MED ORDER — WARFARIN SODIUM 2.5 MG PO TABS: 2.5000 mg | ORAL_TABLET | Freq: Every day | ORAL | 0 refills | Status: AC

## 2019-03-02 MED ORDER — FINASTERIDE 5 MG PO TABS: 5.0000 mg | ORAL_TABLET | Freq: Every morning | ORAL | 0 refills | Status: AC

## 2019-03-02 MED ORDER — DOCUSATE SODIUM 100 MG PO CAPS: 100.0000 mg | ORAL_CAPSULE | Freq: Two times a day (BID) | ORAL | 0 refills | Status: DC

## 2019-03-02 MED ORDER — ACETAMINOPHEN 325 MG PO TABS: 650.0000 mg | ORAL_TABLET | Freq: Four times a day (QID) | ORAL | Status: AC | PRN

## 2019-03-02 NOTE — Progress Notes (Signed)
Discharge instructions called to Mrs. Rempel by Marlowe Shores, PA-C with verbal understanding. Patient discharged to home via transport van. Patient belongings sent with patient. All questions asked and answered with verbal understanding.

## 2019-03-02 NOTE — Progress Notes (Addendum)
[facility] PHYSICAL MEDICINE & REHABILITATION PROGRESS NOTE  Subjective/Complaints:  Pt aware of D/C date  ROS: Patient denies CP, SOB, N/V/D  Objective: Vital Signs: Blood pressure 110/71, pulse 72, temperature 98.3 F (36.8 C), resp. rate 18, height 5\' 10"  (1.778 m), weight 91.6 kg, SpO2 99 %. No results found. Recent Labs    02/28/19 0546  WBC 6.2  HGB 8.9*  HCT 28.3*  PLT 302   No results for input(s): NA, K, CL, CO2, GLUCOSE, BUN, CREATININE, CALCIUM in the last 72 hours.  Physical Exam: BP 110/71 (BP Location: Right Arm)   Pulse 72   Temp 98.3 F (36.8 C)   Resp 18   Ht 5\' 10"  (1.778 m)   Wt 91.6 kg   SpO2 99%   BMI 28.98 kg/m  Constitutional: No distress . Vital signs reviewed. HEENT: EOMI, oral membranes moist Neck: supple Cardiovascular: IRR IRR Respiratory: CTA Bilaterally without wheezes or rales. Normal effort    GI: BS +, non-tender, non-distended  Musculoskeletal: left knee tender Neurological: He is alert.  Patient is more oriented today , also is more alert Oriented x person and hospital, reason he's here Follow simple commands Motor: Right lower extremity: 4+/5 proximal distal Left lower extremity: Hip flexion 2+/5, knee locked in brace, ankle dorsiflexion 4/5  Skin:  Left knee with immobilizer in place.  wound CDI Psychiatric:anxious   Assessment/Plan:  1. Functional deficits secondary to left knee patellar tendon rupture and meniscal debridement s/p repair Stable for D/C today F/u PCP in 3-4 weeks F/u PM&R 2 weeks See D/C summary  See D/C instructions  Care Tool:  Bathing  Bathing activity did not occur: Safety/medical concerns Body parts bathed by patient: Right arm, Left arm, Chest, Abdomen, Front perineal area, Right upper leg(per staff report)   Body parts bathed by helper: Right lower leg, Buttocks Body parts n/a: Left upper leg, Left lower leg   Bathing assist Assist Level: Moderate Assistance - Patient 50 - 74%      Upper Body Dressing/Undressing Upper body dressing   What is the patient wearing?: Pull over shirt    Upper body assist Assist Level: Supervision/Verbal cueing    Lower Body Dressing/Undressing Lower body dressing      What is the patient wearing?: Pants     Lower body assist Assist for lower body dressing: Maximal Assistance - Patient 25 - 49%     Toileting Toileting Toileting Activity did not occur Landscape architect and hygiene only): (condom cath.)  Toileting assist Assist for toileting: Maximal Assistance - Patient 25 - 49% Assistive Device Comment: urinal   Transfers Chair/bed transfer  Transfers assist     Chair/bed transfer assist level: Contact Guard/Touching assist     Locomotion Ambulation   Ambulation assist   Ambulation activity did not occur: Safety/medical concerns(L LE WBAT and painful)          Walk 10 feet activity   Assist  Walk 10 feet activity did not occur: Safety/medical concerns        Walk 50 feet activity   Assist Walk 50 feet with 2 turns activity did not occur: Safety/medical concerns         Walk 150 feet activity   Assist Walk 150 feet activity did not occur: Safety/medical concerns         Walk 10 feet on uneven surface  activity   Assist Walk 10 feet on uneven surfaces activity did not occur: Safety/medical concerns  Wheelchair     Assist Will patient use wheelchair at discharge?: Yes Type of Wheelchair: Manual    Wheelchair assist level: Supervision/Verbal cueing Max wheelchair distance: 176ft    Wheelchair 50 feet with 2 turns activity    Assist        Assist Level: Supervision/Verbal cueing   Wheelchair 150 feet activity     Assist Wheelchair 150 feet activity did not occur: Refused   Assist Level: Supervision/Verbal cueing      Medical Problem List and Plan: 1.  Decreased functional mobility secondary to acute on chronic left knee pain status post  repair of left patellar tendon rupture and scope debridement of meniscal tears 02-2019 and 02/13/2019. Weightbearing as tolerated with full knee extension brace must be on an full extension no knee range of motion. HH PT ,OT Plan discharge today   Brace at all times in locked, no weightbearing unless braces on 2.  Antithrombotics: -DVT/anticoagulation:  Chronic Coumadin  per pharmacy protocol, INR 2.5             -antiplatelet therapy: N/A 3. Pain Management: hydrocodone as needed 4. Mood:             -antipsychotic agents: lithium 150 mg daily 5. Neuropsych: This patient is not capable of making decisions on his own behalf. 6. Skin/Wound Care:  Routine skin checks 7. Fluids/Electrolytes/Nutrition:  Routine ins and outs  BMP within acceptable range on 4/7 8. Acute on chronic anemia.   Hb 9.6 on 4/7, 9.2 on 4/10 stable, 8.7 on 4/12,-8.9 on 4/19  -INR therapeutic, no gross blood 9. Atrial fibrillation. Cardizem 360 mg daily.   Monitor with increased mobility  Vitals:   03/01/19 1943 03/02/19 0534  BP: 109/63 110/71  Pulse: 64 72  Resp: 19 18  Temp: 98.3 F (36.8 C) 98.3 F (36.8 C)  SpO2: 100% 99%  Heart rate is well controlled 4/21  10. BPH. Pro scar 11. Hyperlipidemia. [place] 12. Hyperglycemia  Monitor with increased mobility   14. Hypoalbuminemia  Supplement initiated on 4/7 15.  Hx dementia on Aricept- has difficulty following commands, , needs cueing to perform MMT correctly,    LOS: 15 days A FACE TO [place] E Zora Glendenning 03/02/2019, 9:25 AM

## 2019-03-03 DIAGNOSIS — Z952 Presence of prosthetic heart valve: Secondary | ICD-10-CM | POA: Diagnosis not present

## 2019-03-03 DIAGNOSIS — W19XXXD Unspecified fall, subsequent encounter: Secondary | ICD-10-CM | POA: Diagnosis not present

## 2019-03-03 DIAGNOSIS — S72492D Other fracture of lower end of left femur, subsequent encounter for closed fracture with routine healing: Secondary | ICD-10-CM | POA: Diagnosis not present

## 2019-03-03 DIAGNOSIS — F0390 Unspecified dementia without behavioral disturbance: Secondary | ICD-10-CM | POA: Diagnosis not present

## 2019-03-03 DIAGNOSIS — Z7901 Long term (current) use of anticoagulants: Secondary | ICD-10-CM | POA: Diagnosis not present

## 2019-03-03 DIAGNOSIS — Z5181 Encounter for therapeutic drug level monitoring: Secondary | ICD-10-CM | POA: Diagnosis not present

## 2019-03-03 DIAGNOSIS — S76112D Strain of left quadriceps muscle, fascia and tendon, subsequent encounter: Secondary | ICD-10-CM | POA: Diagnosis not present

## 2019-03-03 DIAGNOSIS — S83242D Other tear of medial meniscus, current injury, left knee, subsequent encounter: Secondary | ICD-10-CM | POA: Diagnosis not present

## 2019-03-03 DIAGNOSIS — S82142D Displaced bicondylar fracture of left tibia, subsequent encounter for closed fracture with routine healing: Secondary | ICD-10-CM | POA: Diagnosis not present

## 2019-03-03 DIAGNOSIS — S83282D Other tear of lateral meniscus, current injury, left knee, subsequent encounter: Secondary | ICD-10-CM | POA: Diagnosis not present

## 2019-03-03 DIAGNOSIS — I4891 Unspecified atrial fibrillation: Secondary | ICD-10-CM | POA: Diagnosis not present

## 2019-03-04 ENCOUNTER — Telehealth: Payer: Self-pay | Admitting: Cardiology

## 2019-03-04 DIAGNOSIS — S76112D Strain of left quadriceps muscle, fascia and tendon, subsequent encounter: Secondary | ICD-10-CM | POA: Diagnosis not present

## 2019-03-04 DIAGNOSIS — K5900 Constipation, unspecified: Secondary | ICD-10-CM | POA: Diagnosis not present

## 2019-03-04 DIAGNOSIS — R6510 Systemic inflammatory response syndrome (SIRS) of non-infectious origin without acute organ dysfunction: Secondary | ICD-10-CM | POA: Diagnosis not present

## 2019-03-04 DIAGNOSIS — S83282D Other tear of lateral meniscus, current injury, left knee, subsequent encounter: Secondary | ICD-10-CM | POA: Diagnosis not present

## 2019-03-04 DIAGNOSIS — S72492D Other fracture of lower end of left femur, subsequent encounter for closed fracture with routine healing: Secondary | ICD-10-CM | POA: Diagnosis not present

## 2019-03-04 DIAGNOSIS — S82142D Displaced bicondylar fracture of left tibia, subsequent encounter for closed fracture with routine healing: Secondary | ICD-10-CM | POA: Diagnosis not present

## 2019-03-04 DIAGNOSIS — S86812A Strain of other muscle(s) and tendon(s) at lower leg level, left leg, initial encounter: Secondary | ICD-10-CM | POA: Diagnosis not present

## 2019-03-04 DIAGNOSIS — F0390 Unspecified dementia without behavioral disturbance: Secondary | ICD-10-CM | POA: Diagnosis not present

## 2019-03-04 DIAGNOSIS — D6832 Hemorrhagic disorder due to extrinsic circulating anticoagulants: Secondary | ICD-10-CM | POA: Diagnosis not present

## 2019-03-04 DIAGNOSIS — S83242D Other tear of medial meniscus, current injury, left knee, subsequent encounter: Secondary | ICD-10-CM | POA: Diagnosis not present

## 2019-03-04 DIAGNOSIS — F319 Bipolar disorder, unspecified: Secondary | ICD-10-CM | POA: Diagnosis not present

## 2019-03-04 DIAGNOSIS — I480 Paroxysmal atrial fibrillation: Secondary | ICD-10-CM | POA: Diagnosis not present

## 2019-03-04 DIAGNOSIS — D649 Anemia, unspecified: Secondary | ICD-10-CM | POA: Diagnosis not present

## 2019-03-04 NOTE — Telephone Encounter (Signed)
New message:    Gerald Stabs from [facility] calling to see patient INR They would like to take it tomorrow. Please call Gerald Stabs [phone]

## 2019-03-05 ENCOUNTER — Ambulatory Visit (INDEPENDENT_AMBULATORY_CARE_PROVIDER_SITE_OTHER): Payer: Medicare Other | Admitting: Pharmacist Clinician (PhC)/ Clinical Pharmacy Specialist

## 2019-03-05 DIAGNOSIS — I482 Chronic atrial fibrillation, unspecified: Secondary | ICD-10-CM | POA: Diagnosis not present

## 2019-03-05 DIAGNOSIS — S72492D Other fracture of lower end of left femur, subsequent encounter for closed fracture with routine healing: Secondary | ICD-10-CM | POA: Diagnosis not present

## 2019-03-05 DIAGNOSIS — Z5181 Encounter for therapeutic drug level monitoring: Secondary | ICD-10-CM

## 2019-03-05 DIAGNOSIS — F0390 Unspecified dementia without behavioral disturbance: Secondary | ICD-10-CM | POA: Diagnosis not present

## 2019-03-05 DIAGNOSIS — S83282D Other tear of lateral meniscus, current injury, left knee, subsequent encounter: Secondary | ICD-10-CM | POA: Diagnosis not present

## 2019-03-05 DIAGNOSIS — S76112D Strain of left quadriceps muscle, fascia and tendon, subsequent encounter: Secondary | ICD-10-CM | POA: Diagnosis not present

## 2019-03-05 DIAGNOSIS — S83242D Other tear of medial meniscus, current injury, left knee, subsequent encounter: Secondary | ICD-10-CM | POA: Diagnosis not present

## 2019-03-05 DIAGNOSIS — S82142D Displaced bicondylar fracture of left tibia, subsequent encounter for closed fracture with routine healing: Secondary | ICD-10-CM | POA: Diagnosis not present

## 2019-03-05 LAB — POCT INR: INR: 1.8 — AB (ref 2.0–3.0)

## 2019-03-08 DIAGNOSIS — S86812D Strain of other muscle(s) and tendon(s) at lower leg level, left leg, subsequent encounter: Secondary | ICD-10-CM | POA: Diagnosis not present

## 2019-03-08 DIAGNOSIS — S82142D Displaced bicondylar fracture of left tibia, subsequent encounter for closed fracture with routine healing: Secondary | ICD-10-CM | POA: Diagnosis not present

## 2019-03-08 DIAGNOSIS — S83242D Other tear of medial meniscus, current injury, left knee, subsequent encounter: Secondary | ICD-10-CM | POA: Diagnosis not present

## 2019-03-08 DIAGNOSIS — F0390 Unspecified dementia without behavioral disturbance: Secondary | ICD-10-CM | POA: Diagnosis not present

## 2019-03-08 DIAGNOSIS — S72492D Other fracture of lower end of left femur, subsequent encounter for closed fracture with routine healing: Secondary | ICD-10-CM | POA: Diagnosis not present

## 2019-03-08 DIAGNOSIS — S83282D Other tear of lateral meniscus, current injury, left knee, subsequent encounter: Secondary | ICD-10-CM | POA: Diagnosis not present

## 2019-03-08 DIAGNOSIS — S76112D Strain of left quadriceps muscle, fascia and tendon, subsequent encounter: Secondary | ICD-10-CM | POA: Diagnosis not present

## 2019-03-09 DIAGNOSIS — S72492D Other fracture of lower end of left femur, subsequent encounter for closed fracture with routine healing: Secondary | ICD-10-CM | POA: Diagnosis not present

## 2019-03-09 DIAGNOSIS — F0390 Unspecified dementia without behavioral disturbance: Secondary | ICD-10-CM | POA: Diagnosis not present

## 2019-03-09 DIAGNOSIS — S83282D Other tear of lateral meniscus, current injury, left knee, subsequent encounter: Secondary | ICD-10-CM | POA: Diagnosis not present

## 2019-03-09 DIAGNOSIS — S82142D Displaced bicondylar fracture of left tibia, subsequent encounter for closed fracture with routine healing: Secondary | ICD-10-CM | POA: Diagnosis not present

## 2019-03-09 DIAGNOSIS — S83242D Other tear of medial meniscus, current injury, left knee, subsequent encounter: Secondary | ICD-10-CM | POA: Diagnosis not present

## 2019-03-09 DIAGNOSIS — S76112D Strain of left quadriceps muscle, fascia and tendon, subsequent encounter: Secondary | ICD-10-CM | POA: Diagnosis not present

## 2019-03-10 ENCOUNTER — Ambulatory Visit (INDEPENDENT_AMBULATORY_CARE_PROVIDER_SITE_OTHER): Payer: Medicare Other | Admitting: Pharmacist

## 2019-03-10 DIAGNOSIS — I482 Chronic atrial fibrillation, unspecified: Secondary | ICD-10-CM

## 2019-03-10 DIAGNOSIS — S72492D Other fracture of lower end of left femur, subsequent encounter for closed fracture with routine healing: Secondary | ICD-10-CM | POA: Diagnosis not present

## 2019-03-10 DIAGNOSIS — S83242D Other tear of medial meniscus, current injury, left knee, subsequent encounter: Secondary | ICD-10-CM | POA: Diagnosis not present

## 2019-03-10 DIAGNOSIS — S76112D Strain of left quadriceps muscle, fascia and tendon, subsequent encounter: Secondary | ICD-10-CM | POA: Diagnosis not present

## 2019-03-10 DIAGNOSIS — S82142D Displaced bicondylar fracture of left tibia, subsequent encounter for closed fracture with routine healing: Secondary | ICD-10-CM | POA: Diagnosis not present

## 2019-03-10 DIAGNOSIS — Z5181 Encounter for therapeutic drug level monitoring: Secondary | ICD-10-CM | POA: Diagnosis not present

## 2019-03-10 DIAGNOSIS — S83282D Other tear of lateral meniscus, current injury, left knee, subsequent encounter: Secondary | ICD-10-CM | POA: Diagnosis not present

## 2019-03-10 DIAGNOSIS — F0390 Unspecified dementia without behavioral disturbance: Secondary | ICD-10-CM | POA: Diagnosis not present

## 2019-03-10 LAB — POCT INR: INR: 1.9 — AB (ref 2.0–3.0)

## 2019-03-12 DIAGNOSIS — S83282D Other tear of lateral meniscus, current injury, left knee, subsequent encounter: Secondary | ICD-10-CM | POA: Diagnosis not present

## 2019-03-12 DIAGNOSIS — S72492D Other fracture of lower end of left femur, subsequent encounter for closed fracture with routine healing: Secondary | ICD-10-CM | POA: Diagnosis not present

## 2019-03-12 DIAGNOSIS — S82142D Displaced bicondylar fracture of left tibia, subsequent encounter for closed fracture with routine healing: Secondary | ICD-10-CM | POA: Diagnosis not present

## 2019-03-12 DIAGNOSIS — F0390 Unspecified dementia without behavioral disturbance: Secondary | ICD-10-CM | POA: Diagnosis not present

## 2019-03-12 DIAGNOSIS — S83242D Other tear of medial meniscus, current injury, left knee, subsequent encounter: Secondary | ICD-10-CM | POA: Diagnosis not present

## 2019-03-12 DIAGNOSIS — S76112D Strain of left quadriceps muscle, fascia and tendon, subsequent encounter: Secondary | ICD-10-CM | POA: Diagnosis not present

## 2019-03-15 DIAGNOSIS — S76112D Strain of left quadriceps muscle, fascia and tendon, subsequent encounter: Secondary | ICD-10-CM | POA: Diagnosis not present

## 2019-03-15 DIAGNOSIS — I482 Chronic atrial fibrillation, unspecified: Secondary | ICD-10-CM | POA: Diagnosis not present

## 2019-03-15 DIAGNOSIS — S83282D Other tear of lateral meniscus, current injury, left knee, subsequent encounter: Secondary | ICD-10-CM | POA: Diagnosis not present

## 2019-03-15 DIAGNOSIS — S82142D Displaced bicondylar fracture of left tibia, subsequent encounter for closed fracture with routine healing: Secondary | ICD-10-CM | POA: Diagnosis not present

## 2019-03-15 DIAGNOSIS — S83242D Other tear of medial meniscus, current injury, left knee, subsequent encounter: Secondary | ICD-10-CM | POA: Diagnosis not present

## 2019-03-15 DIAGNOSIS — F0390 Unspecified dementia without behavioral disturbance: Secondary | ICD-10-CM | POA: Diagnosis not present

## 2019-03-15 DIAGNOSIS — S72492D Other fracture of lower end of left femur, subsequent encounter for closed fracture with routine healing: Secondary | ICD-10-CM | POA: Diagnosis not present

## 2019-03-15 DIAGNOSIS — S838X2D Sprain of other specified parts of left knee, subsequent encounter: Secondary | ICD-10-CM | POA: Diagnosis not present

## 2019-03-16 DIAGNOSIS — F0390 Unspecified dementia without behavioral disturbance: Secondary | ICD-10-CM | POA: Diagnosis not present

## 2019-03-16 DIAGNOSIS — S82142D Displaced bicondylar fracture of left tibia, subsequent encounter for closed fracture with routine healing: Secondary | ICD-10-CM | POA: Diagnosis not present

## 2019-03-16 DIAGNOSIS — S83282D Other tear of lateral meniscus, current injury, left knee, subsequent encounter: Secondary | ICD-10-CM | POA: Diagnosis not present

## 2019-03-16 DIAGNOSIS — S83242D Other tear of medial meniscus, current injury, left knee, subsequent encounter: Secondary | ICD-10-CM | POA: Diagnosis not present

## 2019-03-16 DIAGNOSIS — S76112D Strain of left quadriceps muscle, fascia and tendon, subsequent encounter: Secondary | ICD-10-CM | POA: Diagnosis not present

## 2019-03-16 DIAGNOSIS — S72492D Other fracture of lower end of left femur, subsequent encounter for closed fracture with routine healing: Secondary | ICD-10-CM | POA: Diagnosis not present

## 2019-03-17 DIAGNOSIS — S82142D Displaced bicondylar fracture of left tibia, subsequent encounter for closed fracture with routine healing: Secondary | ICD-10-CM | POA: Diagnosis not present

## 2019-03-17 DIAGNOSIS — S83242D Other tear of medial meniscus, current injury, left knee, subsequent encounter: Secondary | ICD-10-CM | POA: Diagnosis not present

## 2019-03-17 DIAGNOSIS — F0390 Unspecified dementia without behavioral disturbance: Secondary | ICD-10-CM | POA: Diagnosis not present

## 2019-03-17 DIAGNOSIS — S83282D Other tear of lateral meniscus, current injury, left knee, subsequent encounter: Secondary | ICD-10-CM | POA: Diagnosis not present

## 2019-03-17 DIAGNOSIS — S72492D Other fracture of lower end of left femur, subsequent encounter for closed fracture with routine healing: Secondary | ICD-10-CM | POA: Diagnosis not present

## 2019-03-17 DIAGNOSIS — S76112D Strain of left quadriceps muscle, fascia and tendon, subsequent encounter: Secondary | ICD-10-CM | POA: Diagnosis not present

## 2019-03-18 ENCOUNTER — Inpatient Hospital Stay (HOSPITAL_COMMUNITY)
Admission: EM | Admit: 2019-03-18 | Discharge: 2019-03-25 | DRG: 871 | Disposition: A | Discharge status: Home Health | Payer: Medicare Other | Attending: Internal Medicine | Admitting: Internal Medicine

## 2019-03-18 ENCOUNTER — Emergency Department (HOSPITAL_COMMUNITY): Payer: Medicare Other

## 2019-03-18 ENCOUNTER — Other Ambulatory Visit: Payer: Self-pay

## 2019-03-18 ENCOUNTER — Encounter (HOSPITAL_COMMUNITY): Payer: Self-pay

## 2019-03-18 DIAGNOSIS — D649 Anemia, unspecified: Secondary | ICD-10-CM | POA: Diagnosis present

## 2019-03-18 DIAGNOSIS — R4020 Unspecified coma: Secondary | ICD-10-CM | POA: Diagnosis not present

## 2019-03-18 DIAGNOSIS — Z8249 Family history of ischemic heart disease and other diseases of the circulatory system: Secondary | ICD-10-CM | POA: Diagnosis not present

## 2019-03-18 DIAGNOSIS — M2550 Pain in unspecified joint: Secondary | ICD-10-CM | POA: Diagnosis not present

## 2019-03-18 DIAGNOSIS — M009 Pyogenic arthritis, unspecified: Secondary | ICD-10-CM | POA: Diagnosis present

## 2019-03-18 DIAGNOSIS — R791 Abnormal coagulation profile: Secondary | ICD-10-CM | POA: Diagnosis present

## 2019-03-18 DIAGNOSIS — R0902 Hypoxemia: Secondary | ICD-10-CM

## 2019-03-18 DIAGNOSIS — Z20828 Contact with and (suspected) exposure to other viral communicable diseases: Secondary | ICD-10-CM | POA: Diagnosis present

## 2019-03-18 DIAGNOSIS — E876 Hypokalemia: Secondary | ICD-10-CM | POA: Diagnosis present

## 2019-03-18 DIAGNOSIS — F0390 Unspecified dementia without behavioral disturbance: Secondary | ICD-10-CM | POA: Diagnosis present

## 2019-03-18 DIAGNOSIS — Z79899 Other long term (current) drug therapy: Secondary | ICD-10-CM

## 2019-03-18 DIAGNOSIS — F319 Bipolar disorder, unspecified: Secondary | ICD-10-CM | POA: Diagnosis present

## 2019-03-18 DIAGNOSIS — R7881 Bacteremia: Secondary | ICD-10-CM | POA: Diagnosis not present

## 2019-03-18 DIAGNOSIS — R4182 Altered mental status, unspecified: Secondary | ICD-10-CM | POA: Diagnosis not present

## 2019-03-18 DIAGNOSIS — R531 Weakness: Secondary | ICD-10-CM | POA: Diagnosis not present

## 2019-03-18 DIAGNOSIS — N400 Enlarged prostate without lower urinary tract symptoms: Secondary | ICD-10-CM | POA: Diagnosis present

## 2019-03-18 DIAGNOSIS — M19041 Primary osteoarthritis, right hand: Secondary | ICD-10-CM | POA: Diagnosis present

## 2019-03-18 DIAGNOSIS — M109 Gout, unspecified: Secondary | ICD-10-CM | POA: Diagnosis present

## 2019-03-18 DIAGNOSIS — I4819 Other persistent atrial fibrillation: Secondary | ICD-10-CM | POA: Diagnosis present

## 2019-03-18 DIAGNOSIS — M119 Crystal arthropathy, unspecified: Secondary | ICD-10-CM | POA: Diagnosis not present

## 2019-03-18 DIAGNOSIS — N10 Acute tubulo-interstitial nephritis: Secondary | ICD-10-CM | POA: Diagnosis present

## 2019-03-18 DIAGNOSIS — E872 Acidosis: Secondary | ICD-10-CM | POA: Diagnosis present

## 2019-03-18 DIAGNOSIS — S82142D Displaced bicondylar fracture of left tibia, subsequent encounter for closed fracture with routine healing: Secondary | ICD-10-CM | POA: Diagnosis not present

## 2019-03-18 DIAGNOSIS — A4151 Sepsis due to Escherichia coli [E. coli]: Principal | ICD-10-CM | POA: Diagnosis present

## 2019-03-18 DIAGNOSIS — S76112D Strain of left quadriceps muscle, fascia and tendon, subsequent encounter: Secondary | ICD-10-CM | POA: Diagnosis not present

## 2019-03-18 DIAGNOSIS — M659 Synovitis and tenosynovitis, unspecified: Secondary | ICD-10-CM | POA: Diagnosis present

## 2019-03-18 DIAGNOSIS — N390 Urinary tract infection, site not specified: Secondary | ICD-10-CM

## 2019-03-18 DIAGNOSIS — G9341 Metabolic encephalopathy: Secondary | ICD-10-CM | POA: Diagnosis present

## 2019-03-18 DIAGNOSIS — R6520 Severe sepsis without septic shock: Secondary | ICD-10-CM | POA: Diagnosis present

## 2019-03-18 DIAGNOSIS — A4150 Gram-negative sepsis, unspecified: Secondary | ICD-10-CM | POA: Diagnosis present

## 2019-03-18 DIAGNOSIS — N3001 Acute cystitis with hematuria: Secondary | ICD-10-CM | POA: Diagnosis not present

## 2019-03-18 DIAGNOSIS — S83282D Other tear of lateral meniscus, current injury, left knee, subsequent encounter: Secondary | ICD-10-CM | POA: Diagnosis not present

## 2019-03-18 DIAGNOSIS — I482 Chronic atrial fibrillation, unspecified: Secondary | ICD-10-CM | POA: Diagnosis present

## 2019-03-18 DIAGNOSIS — R319 Hematuria, unspecified: Secondary | ICD-10-CM | POA: Diagnosis not present

## 2019-03-18 DIAGNOSIS — I4891 Unspecified atrial fibrillation: Secondary | ICD-10-CM | POA: Diagnosis not present

## 2019-03-18 DIAGNOSIS — I10 Essential (primary) hypertension: Secondary | ICD-10-CM | POA: Diagnosis present

## 2019-03-18 DIAGNOSIS — S72492D Other fracture of lower end of left femur, subsequent encounter for closed fracture with routine healing: Secondary | ICD-10-CM | POA: Diagnosis not present

## 2019-03-18 DIAGNOSIS — R404 Transient alteration of awareness: Secondary | ICD-10-CM | POA: Diagnosis not present

## 2019-03-18 DIAGNOSIS — R410 Disorientation, unspecified: Secondary | ICD-10-CM | POA: Diagnosis not present

## 2019-03-18 DIAGNOSIS — A419 Sepsis, unspecified organism: Secondary | ICD-10-CM | POA: Diagnosis not present

## 2019-03-18 DIAGNOSIS — S83242D Other tear of medial meniscus, current injury, left knee, subsequent encounter: Secondary | ICD-10-CM | POA: Diagnosis not present

## 2019-03-18 DIAGNOSIS — Z03818 Encounter for observation for suspected exposure to other biological agents ruled out: Secondary | ICD-10-CM | POA: Diagnosis not present

## 2019-03-18 DIAGNOSIS — Z7901 Long term (current) use of anticoagulants: Secondary | ICD-10-CM

## 2019-03-18 DIAGNOSIS — K219 Gastro-esophageal reflux disease without esophagitis: Secondary | ICD-10-CM | POA: Diagnosis present

## 2019-03-18 DIAGNOSIS — I351 Nonrheumatic aortic (valve) insufficiency: Secondary | ICD-10-CM | POA: Diagnosis present

## 2019-03-18 DIAGNOSIS — Z7401 Bed confinement status: Secondary | ICD-10-CM | POA: Diagnosis not present

## 2019-03-18 DIAGNOSIS — E785 Hyperlipidemia, unspecified: Secondary | ICD-10-CM | POA: Diagnosis present

## 2019-03-18 DIAGNOSIS — D72829 Elevated white blood cell count, unspecified: Secondary | ICD-10-CM | POA: Diagnosis present

## 2019-03-18 DIAGNOSIS — I959 Hypotension, unspecified: Secondary | ICD-10-CM | POA: Diagnosis not present

## 2019-03-18 DIAGNOSIS — M79641 Pain in right hand: Secondary | ICD-10-CM | POA: Diagnosis not present

## 2019-03-18 LAB — CBC WITH DIFFERENTIAL/PLATELET
Abs Immature Granulocytes: 0.19 10*3/uL — ABNORMAL HIGH (ref 0.00–0.07)
Basophils Absolute: 0 10*3/uL (ref 0.0–0.1)
Basophils Relative: 0 %
Eosinophils Absolute: 0 10*3/uL (ref 0.0–0.5)
Eosinophils Relative: 0 %
HCT: 39.8 % (ref 39.0–52.0)
Hemoglobin: 12.5 g/dL — ABNORMAL LOW (ref 13.0–17.0)
Immature Granulocytes: 1 %
Lymphocytes Relative: 4 %
Lymphs Abs: 0.8 10*3/uL (ref 0.7–4.0)
MCH: 27.4 pg (ref 26.0–34.0)
MCHC: 31.4 g/dL (ref 30.0–36.0)
MCV: 87.3 fL (ref 80.0–100.0)
Monocytes Absolute: 0.9 10*3/uL (ref 0.1–1.0)
Monocytes Relative: 4 %
Neutro Abs: 20.5 10*3/uL — ABNORMAL HIGH (ref 1.7–7.7)
Neutrophils Relative %: 91 %
Platelets: 409 10*3/uL — ABNORMAL HIGH (ref 150–400)
RBC: 4.56 MIL/uL (ref 4.22–5.81)
RDW: 18.4 % — ABNORMAL HIGH (ref 11.5–15.5)
WBC: 22.5 10*3/uL — ABNORMAL HIGH (ref 4.0–10.5)
nRBC: 0 % (ref 0.0–0.2)

## 2019-03-18 LAB — SARS CORONAVIRUS 2 BY RT PCR (HOSPITAL ORDER, PERFORMED IN ~~LOC~~ HOSPITAL LAB): SARS Coronavirus 2: NEGATIVE

## 2019-03-18 LAB — URINALYSIS, ROUTINE W REFLEX MICROSCOPIC
Bacteria, UA: NONE SEEN
Bilirubin Urine: NEGATIVE
Glucose, UA: 50 mg/dL — AB
Ketones, ur: NEGATIVE mg/dL
Nitrite: POSITIVE — AB
Protein, ur: 30 mg/dL — AB
Specific Gravity, Urine: 1.01 (ref 1.005–1.030)
WBC, UA: >50 WBC/hpf — ABNORMAL HIGH (ref 0–5)
pH: 6 (ref 5.0–8.0)

## 2019-03-18 LAB — COMPREHENSIVE METABOLIC PANEL
ALT: 40 U/L (ref 0–44)
AST: 17 U/L (ref 15–41)
Albumin: 2.6 g/dL — ABNORMAL LOW (ref 3.5–5.0)
Alkaline Phosphatase: 113 U/L (ref 38–126)
Anion gap: 12 (ref 5–15)
BUN: 17 mg/dL (ref 8–23)
CO2: 20 mmol/L — ABNORMAL LOW (ref 22–32)
Calcium: 8.8 mg/dL — ABNORMAL LOW (ref 8.9–10.3)
Chloride: 105 mmol/L (ref 98–111)
Creatinine, Ser: 1.06 mg/dL (ref 0.61–1.24)
GFR calc Af Amer: >60 mL/min (ref >60)
GFR calc non Af Amer: >60 mL/min (ref >60)
Glucose, Bld: 134 mg/dL — ABNORMAL HIGH (ref 70–99)
Potassium: 3.5 mmol/L (ref 3.5–5.1)
Sodium: 137 mmol/L (ref 135–145)
Total Bilirubin: 1.4 mg/dL — ABNORMAL HIGH (ref 0.3–1.2)
Total Protein: 7.1 g/dL (ref 6.5–8.1)

## 2019-03-18 LAB — LITHIUM LEVEL: Lithium Lvl: 0.17 mmol/L — ABNORMAL LOW (ref 0.60–1.20)

## 2019-03-18 LAB — LACTIC ACID, PLASMA: Lactic Acid, Venous: 1.4 mmol/L (ref 0.5–1.9)

## 2019-03-18 LAB — PROTIME-INR
INR: 4 — ABNORMAL HIGH (ref 0.8–1.2)
Prothrombin Time: 38.3 seconds — ABNORMAL HIGH (ref 11.4–15.2)

## 2019-03-18 MED ORDER — ONDANSETRON HCL 4 MG PO TABS: 4.0000 mg | ORAL_TABLET | Freq: Four times a day (QID) | ORAL | Status: DC | PRN

## 2019-03-18 MED ORDER — ACETAMINOPHEN 650 MG RE SUPP
650.0000 mg | Freq: Once | RECTAL | Status: AC
  Administered 2019-03-18: 19:00:00 650 mg via RECTAL
  Filled 2019-03-18: qty 1

## 2019-03-18 MED ORDER — ONDANSETRON HCL 4 MG/2ML IJ SOLN: 4.0000 mg | Freq: Four times a day (QID) | INTRAMUSCULAR | Status: DC | PRN

## 2019-03-18 MED ORDER — SODIUM CHLORIDE 0.9 % IV BOLUS
1000.0000 mL | Freq: Once | INTRAVENOUS | Status: AC
  Administered 2019-03-18: 19:00:00 1000 mL via INTRAVENOUS

## 2019-03-18 MED ORDER — SODIUM CHLORIDE 0.9 % IV SOLN
1.0000 g | Freq: Once | INTRAVENOUS | Status: AC
  Administered 2019-03-18: 19:00:00 1 g via INTRAVENOUS
  Filled 2019-03-18: qty 10

## 2019-03-18 MED ORDER — SODIUM CHLORIDE 0.9 % IV SOLN: 1.0000 g | INTRAVENOUS | Status: DC

## 2019-03-18 MED ORDER — SODIUM CHLORIDE 0.9 % IV SOLN
INTRAVENOUS | Status: DC
  Administered 2019-03-18 – 2019-03-24 (×14): via INTRAVENOUS

## 2019-03-18 MED ORDER — SODIUM CHLORIDE 0.9 % IV BOLUS
1000.0000 mL | Freq: Once | INTRAVENOUS | Status: AC
  Administered 2019-03-18: 20:00:00 1000 mL via INTRAVENOUS

## 2019-03-18 NOTE — ED Notes (Signed)
Attempted to givv

## 2019-03-18 NOTE — H&P (Signed)
History and Physical   Russell Dillon XHB:[id number] DOB: [birth year] DOA: 03/18/2019  Referring MD/NP/PA: Dr. Darl Householder  PCP: Marton Redwood, MD   Patient coming from: Home  Chief Complaint: Altered mental status and hematuria  HPI: Russell Dillon is a 73 y.o. male with medical history significant of chronic atrial fibrillation on warfarin, hyperlipidemia, hypertension, recent patella tendon rupture status post surgery about 3 weeks ago, GERD, dementia and bipolar disorder who came into the ER with confusion fever and hematuria.  Patient had notable fever.  Wife reported some chills but more notably was the confusion.  No falls.  No sick contacts.  Patient has been staying at home for the most part.  He has had some dysuria in addition to blood that was noted in his urine.  He was seen by his PCP about a week ago and get him 7-day course of Levaquin.  The fever has resolved but he is continued to be more confused.  He is having pain in the left hip but not the knee area where he had surgery.  Patient was found to have sepsis syndrome in the ER with evidence of ongoing urinary tract infection..  ED Course: Temperature is 102.1, blood pressure 97/57, pulse 119, respiratory rate of 18, oxygen sat 97% on room air.  Urinalysis showed cloudy urine large leukocytes nitrite positive WBC more than 50 RBC 21-50.  Urine cultures have been obtained blood cultures have been obtained.  Head CT without contrast has been done which is negative and patient is being admitted forTreatment.  He does have leukocytosis with a white count of 22,000.  Chemistry is otherwise not very remarkable.  Chest x-ray showed no acute findings.  Review of Systems: As per HPI otherwise 10 point review of systems negative.    Past Medical History:  Diagnosis Date  . Aortic valve regurgitation 10/16/2018   Echo 07/28/2017:  EF 60-65, moderate AI, aortic root and ascending aorta mildly dilated (40 mm), ascending aorta 43 mm, MAC, mild  MR, mild LAE, moderate RV enlargement, trivial TR // Echo 12/19: EF 60-65, normal wall motion, mild AI, moderate BAE, ascending aorta 43 mm, aortic root 39 mm  . BPH (benign prostatic hypertrophy)   . Chronic anticoagulation   . Chronic atrial fibrillation   . Diastolic dysfunction October 2010   Normal LV systolic function  . Hx SBO   . Hyperlipidemia   . Hypertension   . Patellar tendon rupture, left, initial encounter 02/10/2019  . Small bowel obstruction [facility])     Past Surgical History:  Procedure Laterality Date  . APPENDECTOMY  2004  . APPLICATION OF WOUND VAC Left 02/11/2019   Procedure: Application Of Wound Vac;  Surgeon: Altamese Lauderhill, MD;  Location: [facility];  Service: Orthopedics;  Laterality: Left;  . CARDIOVASCULAR STRESS TEST  09/30/2007   EF 67%  No ischemia.   Marland Kitchen CARDIOVERSION  05/01/2004  . KNEE ARTHROSCOPY Left 02/11/2019   Procedure: ARTHROSCOPY LEFT KNEE;  Surgeon: Altamese Stonegate, MD;  Location: [facility];  Service: Orthopedics;  Laterality: Left;  . PATELLAR TENDON REPAIR Left 02/11/2019   Procedure: LEFT PATELLA TENDON REPAIR;  Surgeon: Altamese Cobden, MD;  Location: [facility];  Service: Orthopedics;  Laterality: Left;  . SMALL INTESTINE SURGERY  2004  . US ECHOCARDIOGRAPHY  09/05/2009   ef 55-60%     reports that he has never smoked. He has never used smokeless tobacco. He reports that he does not drink alcohol or use drugs.  No Known  Allergies  Family History  Problem Relation Age of Onset  . Heart attack Mother   . Hypertension Mother   . Diabetes Father   . Heart disease Father   . Colon cancer Neg Hx   . Esophageal cancer Neg Hx   . Pancreatic cancer Neg Hx   . Prostate cancer Neg Hx   . Rectal cancer Neg Hx   . Stomach cancer Neg Hx      Prior to Admission medications   Medication Sig Start Date End Date Taking? Authorizing Provider  acetaminophen (TYLENOL) 325 MG tablet Take 2 tablets (650 mg total) by mouth every 6 (six) hours as needed for mild pain,  fever or headache. 03/02/19   Angiulli, Lavon Paganini, PA-C  Acetylcysteine (NAC 600 PO) Take 1 tablet by mouth at bedtime.    [provider]  Cholecalciferol (VITAMIN D3 PO) Take 1 tablet by mouth 2 (two) times daily.    [provider]  diltiazem (CARDIZEM CD) 360 MG 24 hr capsule Take 1 capsule (360 mg total) by mouth daily. 03/02/19   Angiulli, Lavon Paganini, PA-C  docusate sodium (COLACE) 100 MG capsule Take 1 capsule (100 mg total) by mouth 2 (two) times daily. 03/02/19   Angiulli, Lavon Paganini, PA-C  donepezil (ARICEPT) 10 MG tablet Take 1 tablet (10 mg total) by mouth daily. 03/02/19   Angiulli, Lavon Paganini, PA-C  finasteride (PROSCAR) 5 MG tablet Take 1 tablet (5 mg total) by mouth every morning. 03/02/19   Angiulli, Lavon Paganini, PA-C  HYDROcodone-acetaminophen (NORCO/VICODIN) 5-325 MG tablet Take 1-2 tablets by mouth every 4 (four) hours as needed for moderate pain (pain score 4-6). 03/02/19   Angiulli, Lavon Paganini, PA-C  lithium carbonate 150 MG capsule Take 1 capsule (150 mg total) by mouth at bedtime. 01/19/19   Cottle, Billey Co., MD  Multiple Vitamins-Minerals (ICAPS AREDS 2 PO) Take 1 tablet by mouth daily.    [provider]  Omega-3 Fatty Acids (OMEGA-3 FISH OIL PO) Take 1 tablet by mouth at bedtime.    [provider]  warfarin (COUMADIN) 2.5 MG tablet Take 1 tablet (2.5 mg total) by mouth daily. 03/02/19   Cathlyn Parsons, PA-C    Physical Exam: Vitals:   03/18/19 2130 03/18/19 2145 03/18/19 2245 03/18/19 2337  BP: (!) 114/54 (!) 97/57 101/60 106/62  Pulse: (!) 118 98 (!) 101 (!) 111  Resp: _0 Temp:    98.6 F (37 C)  TempSrc:      SpO2: 100% 97% 99% 97%  Weight:    82.5 kg  Height:    _1  (1.778 m)      Constitutional: NAD, calm, confused Vitals:   03/18/19 2130 03/18/19 2145 03/18/19 2245 03/18/19 2337  BP: (!) 114/54 (!) 97/57 101/60 106/62  Pulse: (!) 118 98 (!) 101 (!) 111  Resp: _2 Temp:    98.6 F (37 C)  TempSrc:       SpO2: 100% 97% 99% 97%  Weight:    82.5 kg  Height:    _3  (1.778 m)   Eyes: PERRL, lids and conjunctivae normal ENMT: Mucous membranes are dry. Posterior pharynx clear of any exudate or lesions.Normal dentition.  Neck: normal, supple, no masses, no thyromegaly Respiratory: clear to auscultation bilaterally, no wheezing, no crackles. Normal respiratory effort. No accessory muscle use.  Cardiovascular: Sinus tachycardia, no murmurs / rubs / gallops. No extremity edema. 2+ pedal pulses. No carotid bruits.  Abdomen: no tenderness, no masses palpated. No hepatosplenomegaly. Bowel sounds positive.  Musculoskeletal: no clubbing / cyanosis. No joint deformity upper and lower extremities. Good ROM, no contractures. Normal muscle tone.  Left knee immobilized, surgical site appears clean with no evidence of infection Skin: no rashes, lesions, ulcers. No induration Neurologic: CN 2-12 grossly intact. Sensation intact, DTR normal. Strength 5/5 in all 4.  Psychiatric: Slightly confused but no agitation, normal mood.     Labs on Admission: I have personally reviewed following labs and imaging studies  CBC: Recent Labs  Lab 03/18/19 1819  WBC 22.5*  NEUTROABS 20.5*  HGB 12.5*  HCT 39.8  MCV 87.3  PLT 983*   Basic Metabolic Panel: Recent Labs  Lab 03/18/19 1819  NA 137  K 3.5  CL 105  CO2 20*  GLUCOSE 134*  BUN 17  CREATININE 1.06  CALCIUM 8.8*   GFR: Estimated Creatinine Clearance: 63.1 mL/min (by C-G formula based on SCr of 1.06 mg/dL). Liver Function Tests: Recent Labs  Lab 03/18/19 1819  AST 17  ALT 40  ALKPHOS 113  BILITOT 1.4*  PROT 7.1  ALBUMIN 2.6*   No results for input(s): LIPASE, AMYLASE in the last 168 hours. No results for input(s): AMMONIA in the last 168 hours. Coagulation Profile: Recent Labs  Lab 03/18/19 1819  INR 4.0*   Cardiac Enzymes: No results for input(s): CKTOTAL, CKMB, CKMBINDEX, TROPONINI in the last 168 hours. BNP (last 3 results) No  results for input(s): PROBNP in the last 8760 hours. HbA1C: No results for input(s): HGBA1C in the last 72 hours. CBG: No results for input(s): GLUCAP in the last 168 hours. Lipid Profile: No results for input(s): CHOL, HDL, LDLCALC, TRIG, CHOLHDL, LDLDIRECT in the last 72 hours. Thyroid Function Tests: No results for input(s): TSH, T4TOTAL, FREET4, T3FREE, THYROIDAB in the last 72 hours. Anemia Panel: No results for input(s): VITAMINB12, FOLATE, FERRITIN, TIBC, IRON, RETICCTPCT in the last 72 hours. Urine analysis:    Component Value Date/Time   COLORURINE YELLOW 03/18/2019 2146   APPEARANCEUR CLOUDY (A) 03/18/2019 2146   LABSPEC 1.010 03/18/2019 2146   PHURINE 6.0 03/18/2019 2146   GLUCOSEU 50 (A) 03/18/2019 2146   HGBUR MODERATE (A) 03/18/2019 2146   BILIRUBINUR NEGATIVE 03/18/2019 2146   [place] NEGATIVE 03/18/2019 2146   PROTEINUR 30 (A) 03/18/2019 2146   NITRITE POSITIVE (A) 03/18/2019 2146   LEUKOCYTESUR LARGE (A) 03/18/2019 2146   Sepsis Labs: _0 (procalcitonin:4,lacticidven:4) ) Recent Results (from the past 240 hour(s))  SARS Coronavirus 2 (CEPHEID - Performed in [facility] hospital lab), Hosp Order     Status: None   Collection Time: 03/18/19  7:28 PM  Result Value Ref Range Status   SARS Coronavirus 2 NEGATIVE NEGATIVE Final    Comment: (NOTE) If result is NEGATIVE SARS-CoV-2 target nucleic acids are NOT DETECTED. The SARS-CoV-2 RNA is generally detectable in upper and lower  respiratory specimens during the acute phase of infection. The lowest  concentration of SARS-CoV-2 viral copies this assay can detect is 250  copies / mL. A negative result does not preclude SARS-CoV-2 infection  and should not be used as the sole basis for treatment or other  patient management decisions.  A negative result may occur with  improper specimen collection / handling, submission of specimen other  than nasopharyngeal swab, presence of viral mutation(s) within the   areas targeted by this assay, and inadequate number of viral copies  (<250 copies / mL). A negative result must be combined with  clinical  observations, patient history, and epidemiological information. If result is POSITIVE SARS-CoV-2 target nucleic acids are DETECTED. The SARS-CoV-2 RNA is generally detectable in upper and lower  respiratory specimens dur ing the acute phase of infection.  Positive  results are indicative of active infection with SARS-CoV-2.  Clinical  correlation with patient history and other diagnostic information is  necessary to determine patient infection status.  Positive results do  not rule out bacterial infection or co-infection with other viruses. If result is PRESUMPTIVE POSTIVE SARS-CoV-2 nucleic acids MAY BE PRESENT.   A presumptive positive result was obtained on the submitted specimen  and confirmed on repeat testing.  While 2019 novel coronavirus  (SARS-CoV-2) nucleic acids may be present in the submitted sample  additional confirmatory testing may be necessary for epidemiological  and / or clinical management purposes  to differentiate between  SARS-CoV-2 and other Sarbecovirus currently known to infect humans.  If clinically indicated additional testing with an alternate test  methodology [phone]) is advised. The SARS-CoV-2 RNA is generally  detectable in upper and lower respiratory sp ecimens during the acute  phase of infection. The expected result is Negative. Fact Sheet for Patients:  StrictlyIdeas.no Fact Sheet for Healthcare Providers: BankingDealers.co.za This test is not yet approved or cleared by the [place] FDA and has been authorized for detection and/or diagnosis of SARS-CoV-2 by FDA under an Emergency Use Authorization (EUA).  This EUA will remain in effect (meaning this test can be used) for the duration of the COVID-19 declaration under Section 564(b)(1) of the Act, 21 U.S.C.  section 360bbb-3(b)(1), unless the authorization is terminated or revoked sooner. Performed at [facility], [place] [address], [place], [place] [address]      Radiological Exams on Admission: Ct Head Wo Contrast  Result Date: 03/18/2019 CLINICAL DATA:  Altered level of consciousness, confusion. EXAM: CT HEAD WITHOUT CONTRAST TECHNIQUE: Contiguous axial images were obtained from the base of the skull through the vertex without intravenous contrast. COMPARISON:  02/08/2019 FINDINGS: Brain: There is atrophy and chronic small vessel disease changes. No acute intracranial abnormality. Specifically, no hemorrhage, hydrocephalus, mass lesion, acute infarction, or significant intracranial injury. Vascular: No hyperdense vessel or unexpected calcification. Skull: No acute calvarial abnormality. Sinuses/Orbits: Visualized paranasal sinuses and mastoids clear. Orbital soft tissues unremarkable. Other: None IMPRESSION: Atrophy, chronic microvascular disease. No acute intracranial abnormality. Electronically Signed   By: Rolm Baptise M.D.   On: 03/18/2019 22:22   Dg Chest Port 1 View  Result Date: 03/18/2019 CLINICAL DATA:  Altered mental status. EXAM: PORTABLE CHEST 1 VIEW COMPARISON:  Radiograph of February 08, 2019. FINDINGS: The heart size and mediastinal contours are within normal limits. Both lungs are clear. The visualized skeletal structures are unremarkable. IMPRESSION: No active disease. Electronically Signed   By: Marijo Conception M.D.   On: 03/18/2019 18:54     Assessment/Plan Principal Problem:   Sepsis, Gram negative (HCC) Active Problems:   GERD   Atrial fibrillation, chronic   Long term (current) use of anticoagulants   HTN (hypertension)   Hyperlipidemia   Dementia without behavioral disturbance (HCC)   Leukocytosis   Atrial fibrillation (HCC)     #1 sepsis: Most likely due to pyelonephritis.  Patient will be admitted and initiated on IV antibiotics.  More than likely he is got  bacteria that is resistant to quinolones which is why the Levaquin did not work.  Await urine and blood culture results and adjust accordingly.  #2 acute pyelonephritis: Proceed  as above.  #3 atrial fibrillation: Patient is on warfarin but currently INR is supratherapeutic.  We will have pharmacy dose his warfarin.  Hold it for tonight.  #4 hypertension: Continue with home blood pressure medications.  Watch blood pressure in the setting of sepsis.  #5 hyperlipidemia: Continue with home regimen again.  #6 leukocytosis: Secondary to sepsis.  Monitor white count.  #7 dementia: No behavioral abnormalities at the moment.  Continue to monitor.  #8 GERD: Continue with PPIs.  #9 acute metabolic encephalopathy: Secondary to pyelonephritis.  Continue to monitor mental status.   DVT prophylaxis: Warfarin Code Status: Full code Family Communication: Wife over the phone Disposition Plan: To be determined Consults called: None Admission status: Inpatient  Severity of Illness: The appropriate patient status for this patient is INPATIENT. Inpatient status is judged to be reasonable and necessary in order to provide the required intensity of service to ensure the patient's safety. The patient's presenting symptoms, physical exam findings, and initial radiographic and laboratory data in the context of their chronic comorbidities is felt to place them at high risk for further clinical deterioration. Furthermore, it is not anticipated that the patient will be medically stable for discharge from the hospital within 2 midnights of admission. The following factors support the patient status of inpatient.   " The patient's presenting symptoms include confusion and hematuria. " The worrisome physical exam findings include mild confusion. " The initial radiographic and laboratory data are worrisome because of fever leukocytosis. " The chronic co-morbidities include dementia.   * I certify that at the point  of admission it is my clinical judgment that the patient will require inpatient hospital care spanning beyond 2 midnights from the point of admission due to high intensity of service, high risk for further deterioration and high frequency of surveillance required.Barbette Merino MD [facility] Pager [phone]  If 7PM-7AM, please contact night-coverage [email or web address] Password [email or web address]  03/19/2019, 12:06 AM

## 2019-03-18 NOTE — ED Triage Notes (Addendum)
Pt found with confusion, blood in urine. Recently treated for a UTi without being tested. Finished a round of levoquin on Tuesday.

## 2019-03-18 NOTE — ED Notes (Signed)
Patient transported to CT 

## 2019-03-18 NOTE — ED Provider Notes (Signed)
[place] EMERGENCY DEPARTMENT Provider Note   CSN: [id number] Arrival date & time: 03/18/19  1759    History   Chief Complaint Chief Complaint  Patient presents with  . Hematuria  . Altered Mental Status    HPI Russell Dillon is a 73 y.o. male history of A. fib on Coumadin, hypercholesterol, hyperlipidemia, here presenting with altered mental status, hematuria.  Patient is from home but recently had knee surgery and just finished a course of rehab.  Patient was noted to be more altered by family. He had a fever a week ago and finished a 7 day course of levaquin. His fever resolved. Today, he was noted to be more confused. He also has some blood tinged urine.      The history is provided by the spouse and the EMS personnel.  Level V caveat- AMS, history by phone with spouse   Past Medical History:  Diagnosis Date  . Aortic valve regurgitation 10/16/2018   Echo 07/28/2017:  EF 60-65, moderate AI, aortic root and ascending aorta mildly dilated (40 mm), ascending aorta 43 mm, MAC, mild MR, mild LAE, moderate RV enlargement, trivial TR // Echo 12/19: EF 60-65, normal wall motion, mild AI, moderate BAE, ascending aorta 43 mm, aortic root 39 mm  . BPH (benign prostatic hypertrophy)   . Chronic anticoagulation   . Chronic atrial fibrillation   . Diastolic dysfunction October 2010   Normal LV systolic function  . Hx SBO   . Hyperlipidemia   . Hypertension   . Patellar tendon rupture, left, initial encounter 02/10/2019  . Small bowel obstruction [facility]     Patient Active Problem List   Diagnosis Date Noted  . Hypoalbuminemia   . Leukocytosis   . Hyperglycemia   . Atrial fibrillation ([place])   . Acute on chronic anemia   . Rupture of left patellar tendon 02/15/2019  . Postoperative pain   . Benign prostatic hyperplasia   . Bipolar 1 disorder ([place]) 02/14/2019  . Dementia without behavioral disturbance ([place]) 02/14/2019  . Patellar tendon rupture, left, initial  encounter 02/10/2019  . SIRS (systemic inflammatory response syndrome) ([place]) 02/09/2019  . Osteomyelitis ([place]) 02/09/2019  . Acute blood loss anemia 02/09/2019  . Aortic valve regurgitation 10/16/2018  . Encounter for therapeutic drug monitoring 12/02/2013  . Small bowel obstruction ([place]) 07/08/2013  . Hyperlipidemia 12/20/2011  . HTN (hypertension) 06/04/2011  . Atrial fibrillation, chronic 03/01/2011  . Long term (current) use of anticoagulants 03/01/2011  . GERD 10/11/2009  . DIVERTICULOSIS OF COLON 10/11/2009    Past Surgical History:  Procedure Laterality Date  . APPENDECTOMY  2004  . APPLICATION OF WOUND VAC Left 02/11/2019   Procedure: Application Of Wound Vac;  Surgeon: Altamese Chanute, MD;  Location: [place];  Service: Orthopedics;  Laterality: Left;  . CARDIOVASCULAR STRESS TEST  09/30/2007   EF 67%  No ischemia.   Marland Kitchen CARDIOVERSION  05/01/2004  . KNEE ARTHROSCOPY Left 02/11/2019   Procedure: ARTHROSCOPY LEFT KNEE;  Surgeon: Altamese Vidalia, MD;  Location: [place];  Service: Orthopedics;  Laterality: Left;  . PATELLAR TENDON REPAIR Left 02/11/2019   Procedure: LEFT PATELLA TENDON REPAIR;  Surgeon: Altamese Arroyo Hondo, MD;  Location: [place];  Service: Orthopedics;  Laterality: Left;  . SMALL INTESTINE SURGERY  2004  . US ECHOCARDIOGRAPHY  09/05/2009   ef 55-60%        Home Medications    Prior to Admission medications   Medication Sig Start Date End Date Taking? Authorizing  Provider  acetaminophen (TYLENOL) 325 MG tablet Take 2 tablets (650 mg total) by mouth every 6 (six) hours as needed for mild pain, fever or headache. 03/02/19   Angiulli, Lavon Paganini, PA-C  Acetylcysteine (NAC 600 PO) Take 1 tablet by mouth at bedtime.    [provider]  Cholecalciferol (VITAMIN D3 PO) Take 1 tablet by mouth 2 (two) times daily.    [provider]  diltiazem (CARDIZEM CD) 360 MG 24 hr capsule Take 1 capsule (360 mg total) by mouth daily. 03/02/19   Angiulli, Lavon Paganini, PA-C  docusate  sodium (COLACE) 100 MG capsule Take 1 capsule (100 mg total) by mouth 2 (two) times daily. 03/02/19   Angiulli, Lavon Paganini, PA-C  donepezil (ARICEPT) 10 MG tablet Take 1 tablet (10 mg total) by mouth daily. 03/02/19   Angiulli, Lavon Paganini, PA-C  finasteride (PROSCAR) 5 MG tablet Take 1 tablet (5 mg total) by mouth every morning. 03/02/19   Angiulli, Lavon Paganini, PA-C  HYDROcodone-acetaminophen (NORCO/VICODIN) 5-325 MG tablet Take 1-2 tablets by mouth every 4 (four) hours as needed for moderate pain (pain score 4-6). 03/02/19   Angiulli, Lavon Paganini, PA-C  lithium carbonate 150 MG capsule Take 1 capsule (150 mg total) by mouth at bedtime. 01/19/19   Cottle, Billey Co., MD  Multiple Vitamins-Minerals (ICAPS AREDS 2 PO) Take 1 tablet by mouth daily.    [provider]  Omega-3 Fatty Acids (OMEGA-3 FISH OIL PO) Take 1 tablet by mouth at bedtime.    [provider]  warfarin (COUMADIN) 2.5 MG tablet Take 1 tablet (2.5 mg total) by mouth daily. 03/02/19   Angiulli, Lavon Paganini, PA-C    Family History Family History  Problem Relation Age of Onset  . Heart attack Mother   . Hypertension Mother   . Diabetes Father   . Heart disease Father   . Colon cancer Neg Hx   . Esophageal cancer Neg Hx   . Pancreatic cancer Neg Hx   . Prostate cancer Neg Hx   . Rectal cancer Neg Hx   . Stomach cancer Neg Hx     Social History Social History   Tobacco Use  . Smoking status: Never Smoker  . Smokeless tobacco: Never Used  Substance Use Topics  . Alcohol use: No    Alcohol/week: 0.0 standard drinks  . Drug use: No     Allergies   Patient has no known allergies.   Review of Systems Review of Systems  Genitourinary: Positive for hematuria.  All other systems reviewed and are negative.    Physical Exam Updated Vital Signs BP (!) 106/55 (BP Location: Right Arm)   Pulse (!) 114   Temp (!) 102.1 F (38.9 C) (Oral)   Resp 16   SpO2 99%   Physical Exam Vitals signs and nursing note  reviewed.  Constitutional:      Comments: Confused   HENT:     Head: Normocephalic.     Right Ear: Tympanic membrane normal.     Left Ear: Tympanic membrane normal.     Nose: Nose normal.     Mouth/Throat:     Mouth: Mucous membranes are dry.  Eyes:     Extraocular Movements: Extraocular movements intact.     Pupils: Pupils are equal, round, and reactive to light.  Neck:     Musculoskeletal: Normal range of motion.  Cardiovascular:     Rate and Rhythm: Normal rate and regular rhythm.  Pulmonary:     Effort:  Pulmonary effort is normal.     Breath sounds: Normal breath sounds.  Abdominal:     General: Abdomen is flat.     Palpations: Abdomen is soft.  Musculoskeletal: Normal range of motion.  Skin:    General: Skin is warm.     Capillary Refill: Capillary refill takes less than 2 seconds.  Neurological:     Comments: Demented, confused. nonfocal neuro exam   Psychiatric:        Mood and Affect: Mood normal.      ED Treatments / Results  Labs (all labs ordered are listed, but only abnormal results are displayed) Labs Reviewed  CBC WITH DIFFERENTIAL/PLATELET - Abnormal; Notable for the following components:      Result Value   WBC 22.5 (*)    Hemoglobin 12.5 (*)    RDW 18.4 (*)    Platelets 409 (*)    Neutro Abs 20.5 (*)    Abs Immature Granulocytes 0.19 (*)    All other components within normal limits  LITHIUM LEVEL - Abnormal; Notable for the following components:   Lithium Lvl 0.17 (*)    All other components within normal limits  PROTIME-INR - Abnormal; Notable for the following components:   Prothrombin Time 38.3 (*)    INR 4.0 (*)    All other components within normal limits  URINE CULTURE  CULTURE, BLOOD (ROUTINE X 2)  CULTURE, BLOOD (ROUTINE X 2)  SARS CORONAVIRUS 2 (HOSPITAL ORDER, [facility])  COMPREHENSIVE METABOLIC PANEL  URINALYSIS, ROUTINE W REFLEX MICROSCOPIC  LACTIC ACID, PLASMA  LACTIC ACID, PLASMA    EKG EKG  Interpretation  Date/Time:  Thursday Mar 18 2019 18:25:01 EDT Ventricular Rate:  114 PR Interval:    QRS Duration: 90 QT Interval:  334 QTC Calculation: 460 R Axis:   85 Text Interpretation:  Atrial fibrillation Borderline right axis deviation Low voltage, extremity and precordial leads No significant change since last tracing Confirmed by Wandra Arthurs ([id number]) on 03/18/2019 6:28:37 PM   Radiology Dg Chest Port 1 View  Result Date: 03/18/2019 CLINICAL DATA:  Altered mental status. EXAM: PORTABLE CHEST 1 VIEW COMPARISON:  Radiograph of February 08, 2019. FINDINGS: The heart size and mediastinal contours are within normal limits. Both lungs are clear. The visualized skeletal structures are unremarkable. IMPRESSION: No active disease. Electronically Signed   By: Marijo Conception M.D.   On: 03/18/2019 18:54    Procedures Procedures (including critical care time)  CRITICAL CARE Performed by: Wandra Arthurs   Total critical care time: 30 minutes  Critical care time was exclusive of separately billable procedures and treating other patients.  Critical care was necessary to treat or prevent imminent or life-threatening deterioration.  Critical care was time spent personally by me on the following activities: development of treatment plan with patient and/or surrogate as well as nursing, discussions with consultants, evaluation of patient's response to treatment, examination of patient, obtaining history from patient or surrogate, ordering and performing treatments and interventions, ordering and review of laboratory studies, ordering and review of radiographic studies, pulse oximetry and re-evaluation of patient's condition.   Medications Ordered in ED Medications  cefTRIAXone (ROCEPHIN) 1 g in sodium chloride 0.9 % 100 mL IVPB (1 g Intravenous New Bag/Given 03/18/19 1926)  sodium chloride 0.9 % bolus 1,000 mL (1,000 mLs Intravenous New Bag/Given 03/18/19 1912)  acetaminophen (TYLENOL) suppository 650  mg (650 mg Rectal Given 03/18/19 1917)     Initial Impression / Assessment and  Plan / ED Course  I have reviewed the triage vital signs and the nursing notes.  Pertinent labs & imaging results that were available during my care of the patient were reviewed by me and considered in my medical decision making (see chart for details).       Russell Dillon is a 73 y.o. male here with confusion, fever. I think likely sepsis from UTI vs pneumonia vs COVID. Will get labs, UA, lactate, cultures, CT head, CXR.    10:16 PM UA + UTI. WBC 22. COVID negative. Concerned for sepsis from UTI. Given rocephin, hospitalist to admit.    Final Clinical Impressions(s) / ED Diagnoses   Final diagnoses:  None    ED Discharge Orders    None       Drenda Freeze, MD 03/18/19 2217

## 2019-03-18 NOTE — ED Notes (Signed)
ED TO INPATIENT HANDOFF REPORT  ED Nurse Name and Phone #: Gifford Shave [phone]  S Name/Age/Gender Russell Dillon 73 y.o. male Room/Bed: 030C/030C  Code Status   Code Status: Prior  Home/SNF/Other Home Patient oriented to: self Is this baseline? No   Triage Complete: Triage complete  Chief Complaint ams  Triage Note Pt found with confusion, blood in urine. Recently treated for a UTi without being tested. Finished a round of levoquin on Tuesday.    Allergies No Known Allergies  Level of Care/Admitting Diagnosis ED Disposition    ED Disposition Condition [facility] Area: [place] [[id number]]  Level of Care: Telemetry Medical [104]  Covid Evaluation: N/A  Diagnosis: Sepsis, Gram negative ([place]) [119417]  Admitting Physician: Elwyn Reach [[id number]]  Attending Physician: Elwyn Reach [[id number]]  Estimated length of stay: past midnight tomorrow  Certification:: I certify this patient will need inpatient services for at least 2 midnights  PT Class (Do Not Modify): Inpatient [101]  PT Acc Code (Do Not Modify): Private [1]       B Medical/Surgery History Past Medical History:  Diagnosis Date  . Aortic valve regurgitation 10/16/2018   Echo 07/28/2017:  EF 60-65, moderate AI, aortic root and ascending aorta mildly dilated (40 mm), ascending aorta 43 mm, MAC, mild MR, mild LAE, moderate RV enlargement, trivial TR // Echo 12/19: EF 60-65, normal wall motion, mild AI, moderate BAE, ascending aorta 43 mm, aortic root 39 mm  . BPH (benign prostatic hypertrophy)   . Chronic anticoagulation   . Chronic atrial fibrillation   . Diastolic dysfunction October 2010   Normal LV systolic function  . Hx SBO   . Hyperlipidemia   . Hypertension   . Patellar tendon rupture, left, initial encounter 02/10/2019  . Small bowel obstruction [facility])    Past Surgical History:  Procedure Laterality Date  . APPENDECTOMY  2004  . APPLICATION OF WOUND VAC  Left 02/11/2019   Procedure: Application Of Wound Vac;  Surgeon: Altamese [place], MD;  Location: [place];  Service: Orthopedics;  Laterality: Left;  . CARDIOVASCULAR STRESS TEST  09/30/2007   EF 67%  No ischemia.   Marland Kitchen CARDIOVERSION  05/01/2004  . KNEE ARTHROSCOPY Left 02/11/2019   Procedure: ARTHROSCOPY LEFT KNEE;  Surgeon: Altamese [place], MD;  Location: [place];  Service: Orthopedics;  Laterality: Left;  . PATELLAR TENDON REPAIR Left 02/11/2019   Procedure: LEFT PATELLA TENDON REPAIR;  Surgeon: Altamese , MD;  Location: [place];  Service: Orthopedics;  Laterality: Left;  . SMALL INTESTINE SURGERY  2004  . US ECHOCARDIOGRAPHY  09/05/2009   ef 55-60%     A IV Location/Drains/Wounds Patient Lines/Drains/Airways Status   Active Line/Drains/Airways    Name:   Placement date:   Placement time:   Site:   Days:   Peripheral IV 03/18/19 Right Forearm   03/18/19    1909    Forearm   less than 1   External Urinary Catheter   03/18/19    1930    -   less than 1   Incision (Closed) 02/11/19 Knee Left   02/11/19    1034     35          Intake/Output Last 24 hours No intake or output data in the 24 hours ending 03/18/19 2223  Labs/Imaging Results for orders placed or performed during the hospital encounter of 03/18/19 (from the past 48 hour(s))  CBC with Differential/Platelet  Status: Abnormal   Collection Time: 03/18/19  6:19 PM  Result Value Ref Range   WBC 22.5 (H) 4.0 - 10.5 K/uL   RBC 4.56 4.22 - 5.81 MIL/uL   Hemoglobin 12.5 (L) 13.0 - 17.0 g/dL   HCT 39.8 39.0 - 52.0 %   MCV 87.3 80.0 - 100.0 fL   MCH 27.4 26.0 - 34.0 pg   MCHC 31.4 30.0 - 36.0 g/dL   RDW 18.4 (H) 11.5 - 15.5 %   Platelets 409 (H) 150 - 400 K/uL   nRBC 0.0 0.0 - 0.2 %   Neutrophils Relative % 91 %   Neutro Abs 20.5 (H) 1.7 - 7.7 K/uL   Lymphocytes Relative 4 %   Lymphs Abs 0.8 0.7 - 4.0 K/uL   Monocytes Relative 4 %   Monocytes Absolute 0.9 0.1 - 1.0 K/uL   Eosinophils Relative 0 %   Eosinophils Absolute 0.0 0.0  - 0.5 K/uL   Basophils Relative 0 %   Basophils Absolute 0.0 0.0 - 0.1 K/uL   Immature Granulocytes 1 %   Abs Immature Granulocytes 0.19 (H) 0.00 - 0.07 K/uL    Comment: Performed at [facility], [address], [place], [place] [address]  Comprehensive metabolic panel     Status: Abnormal   Collection Time: 03/18/19  6:19 PM  Result Value Ref Range   Sodium 137 135 - 145 mmol/L   Potassium 3.5 3.5 - 5.1 mmol/L   Chloride 105 98 - 111 mmol/L   CO2 20 (L) 22 - 32 mmol/L   Glucose, Bld 134 (H) 70 - 99 mg/dL   BUN 17 8 - 23 mg/dL   Creatinine, Ser 1.06 0.61 - 1.24 mg/dL   Calcium 8.8 (L) 8.9 - 10.3 mg/dL   Total Protein 7.1 6.5 - 8.1 g/dL   Albumin 2.6 (L) 3.5 - 5.0 g/dL   AST 17 15 - 41 U/L   ALT 40 0 - 44 U/L   Alkaline Phosphatase 113 38 - 126 U/L   Total Bilirubin 1.4 (H) 0.3 - 1.2 mg/dL   GFR calc non Af Amer >60 >60 mL/min   GFR calc Af Amer >60 >60 mL/min   Anion gap 12 5 - 15    Comment: Performed at [facility] [address] [place], [place] [address]  Lithium level     Status: Abnormal   Collection Time: 03/18/19  6:19 PM  Result Value Ref Range   Lithium Lvl 0.17 (L) 0.60 - 1.20 mmol/L    Comment: Performed at [facility] [address], [place], [place] [address]  Protime-INR     Status: Abnormal   Collection Time: 03/18/19  6:19 PM  Result Value Ref Range   Prothrombin Time 38.3 (H) 11.4 - 15.2 seconds   INR 4.0 (H) 0.8 - 1.2    Comment: (NOTE) INR goal varies based on device and disease states. Performed at [facility], [place] [address] [place], [place] [address]   Lactic acid, plasma     Status: None   Collection Time: 03/18/19  6:35 PM  Result Value Ref Range   Lactic Acid, Venous 1.4 0.5 - 1.9 mmol/L    Comment: Performed at [facility] [address] [place], [place] [address]  SARS Coronavirus 2 (CEPHEID - Performed in [facility] hospital lab), Hosp Order     Status: None   Collection Time: 03/18/19  7:28 PM   Result Value Ref Range  SARS Coronavirus 2 NEGATIVE NEGATIVE    Comment: (NOTE) If result is NEGATIVE SARS-CoV-2 target nucleic acids are NOT DETECTED. The SARS-CoV-2 RNA is generally detectable in upper and lower  respiratory specimens during the acute phase of infection. The lowest  concentration of SARS-CoV-2 viral copies this assay can detect is 250  copies / mL. A negative result does not preclude SARS-CoV-2 infection  and should not be used as the sole basis for treatment or other  patient management decisions.  A negative result may occur with  improper specimen collection / handling, submission of specimen other  than nasopharyngeal swab, presence of viral mutation(s) within the  areas targeted by this assay, and inadequate number of viral copies  (<250 copies / mL). A negative result must be combined with clinical  observations, patient history, and epidemiological information. If result is POSITIVE SARS-CoV-2 target nucleic acids are DETECTED. The SARS-CoV-2 RNA is generally detectable in upper and lower  respiratory specimens dur ing the acute phase of infection.  Positive  results are indicative of active infection with SARS-CoV-2.  Clinical  correlation with patient history and other diagnostic information is  necessary to determine patient infection status.  Positive results do  not rule out bacterial infection or co-infection with other viruses. If result is PRESUMPTIVE POSTIVE SARS-CoV-2 nucleic acids MAY BE PRESENT.   A presumptive positive result was obtained on the submitted specimen  and confirmed on repeat testing.  While 2019 novel coronavirus  (SARS-CoV-2) nucleic acids may be present in the submitted sample  additional confirmatory testing may be necessary for epidemiological  and / or clinical management purposes  to differentiate between  SARS-CoV-2 and other Sarbecovirus currently known to infect humans.  If clinically indicated additional testing with  an alternate test  methodology [phone]) is advised. The SARS-CoV-2 RNA is generally  detectable in upper and lower respiratory sp ecimens during the acute  phase of infection. The expected result is Negative. Fact Sheet for Patients:  StrictlyIdeas.no Fact Sheet for Healthcare Providers: BankingDealers.co.za This test is not yet approved or cleared by the [place] FDA and has been authorized for detection and/or diagnosis of SARS-CoV-2 by FDA under an Emergency Use Authorization (EUA).  This EUA will remain in effect (meaning this test can be used) for the duration of the COVID-19 declaration under Section 564(b)(1) of the Act, 21 U.S.C. section 360bbb-3(b)(1), unless the authorization is terminated or revoked sooner. Performed at [facility], [place] [address] [place], [place] [address]   Urinalysis, Routine w reflex microscopic     Status: Abnormal   Collection Time: 03/18/19  9:46 PM  Result Value Ref Range   Color, Urine YELLOW YELLOW   APPearance CLOUDY (A) CLEAR   Specific Gravity, Urine 1.010 1.005 - 1.030   pH 6.0 5.0 - 8.0   Glucose, UA 50 (A) NEGATIVE mg/dL   Hgb urine dipstick MODERATE (A) NEGATIVE   Bilirubin Urine NEGATIVE NEGATIVE   Ketones, ur NEGATIVE NEGATIVE mg/dL   Protein, ur 30 (A) NEGATIVE mg/dL   Nitrite POSITIVE (A) NEGATIVE   Leukocytes,Ua LARGE (A) NEGATIVE   RBC / HPF 21-50 0 - 5 RBC/hpf   WBC, UA >50 (H) 0 - 5 WBC/hpf   Bacteria, UA NONE SEEN NONE SEEN   WBC Clumps PRESENT     Comment: Performed at [facility], [address] [place], [place] [address]   Dg Chest Port 1 View  Result Date: 03/18/2019 CLINICAL DATA:  Altered mental status.  EXAM: PORTABLE CHEST 1 VIEW COMPARISON:  Radiograph of February 08, 2019. FINDINGS: The heart size and mediastinal contours are within normal limits. Both lungs are clear. The visualized skeletal structures are unremarkable. IMPRESSION: No active disease.  Electronically Signed   By: Marijo Conception M.D.   On: 03/18/2019 18:54    Pending Labs Unresulted Labs (From admission, onward)    Start     Ordered   03/18/19 1835  Lactic acid, plasma  Now then every 2 hours,   STAT     03/18/19 1834   03/18/19 1835  Blood culture (routine x 2)  BLOOD CULTURE X 2,   STAT     03/18/19 1834   03/18/19 1820  Urine culture  ONCE - STAT,   STAT     03/18/19 1819          Vitals/Pain Today's Vitals   03/18/19 1825 03/18/19 2000 03/18/19 2008 03/18/19 2130  BP: (!) 106/55 111/67  (!) 114/54  Pulse: (!) 114 (!) 119  (!) 118  Resp: _0 Temp: (!) 102.1 F (38.9 C)     TempSrc: Oral     SpO2: 99% 98%  100%  PainSc:        Isolation Precautions No active isolations  Medications Medications  sodium chloride 0.9 % bolus 1,000 mL (0 mLs Intravenous Stopped 03/18/19 2001)  acetaminophen (TYLENOL) suppository 650 mg (650 mg Rectal Given 03/18/19 1917)  cefTRIAXone (ROCEPHIN) 1 g in sodium chloride 0.9 % 100 mL IVPB (0 g Intravenous Stopped 03/18/19 2001)  sodium chloride 0.9 % bolus 1,000 mL (1,000 mLs Intravenous New Bag/Given 03/18/19 2009)    Mobility walks with person assist High fall risk   Focused Assessments   R Recommendations: See Admitting Provider Note  Report given to:   Additional Notes:

## 2019-03-18 NOTE — ED Notes (Signed)
Attempted to give report to floor nurse. 

## 2019-03-19 ENCOUNTER — Inpatient Hospital Stay (HOSPITAL_COMMUNITY): Payer: Medicare Other

## 2019-03-19 LAB — COMPREHENSIVE METABOLIC PANEL
ALT: 29 U/L (ref 0–44)
AST: 15 U/L (ref 15–41)
Albumin: 2.2 g/dL — ABNORMAL LOW (ref 3.5–5.0)
Alkaline Phosphatase: 102 U/L (ref 38–126)
Anion gap: 11 (ref 5–15)
BUN: 15 mg/dL (ref 8–23)
CO2: 22 mmol/L (ref 22–32)
Calcium: 8.3 mg/dL — ABNORMAL LOW (ref 8.9–10.3)
Chloride: 107 mmol/L (ref 98–111)
Creatinine, Ser: 1.06 mg/dL (ref 0.61–1.24)
GFR calc Af Amer: >60 mL/min (ref >60)
GFR calc non Af Amer: >60 mL/min (ref >60)
Glucose, Bld: 126 mg/dL — ABNORMAL HIGH (ref 70–99)
Potassium: 3.4 mmol/L — ABNORMAL LOW (ref 3.5–5.1)
Sodium: 140 mmol/L (ref 135–145)
Total Bilirubin: 0.9 mg/dL (ref 0.3–1.2)
Total Protein: 6.3 g/dL — ABNORMAL LOW (ref 6.5–8.1)

## 2019-03-19 LAB — BLOOD CULTURE ID PANEL (REFLEXED)

## 2019-03-19 LAB — CBC
HCT: 35.1 % — ABNORMAL LOW (ref 39.0–52.0)
Hemoglobin: 11.2 g/dL — ABNORMAL LOW (ref 13.0–17.0)
MCH: 27.3 pg (ref 26.0–34.0)
MCHC: 31.9 g/dL (ref 30.0–36.0)
MCV: 85.6 fL (ref 80.0–100.0)
Platelets: 374 10*3/uL (ref 150–400)
RBC: 4.1 MIL/uL — ABNORMAL LOW (ref 4.22–5.81)
RDW: 18.4 % — ABNORMAL HIGH (ref 11.5–15.5)
WBC: 22 10*3/uL — ABNORMAL HIGH (ref 4.0–10.5)
nRBC: 0 % (ref 0.0–0.2)

## 2019-03-19 LAB — PROTIME-INR
INR: 4.4 (ref 0.8–1.2)
Prothrombin Time: 41 seconds — ABNORMAL HIGH (ref 11.4–15.2)

## 2019-03-19 LAB — MRSA PCR SCREENING: MRSA by PCR: NEGATIVE

## 2019-03-19 LAB — TSH: TSH: 2.128 u[IU]/mL (ref 0.350–4.500)

## 2019-03-19 LAB — LACTIC ACID, PLASMA: Lactic Acid, Venous: 1.1 mmol/L (ref 0.5–1.9)

## 2019-03-19 MED ORDER — DILTIAZEM HCL 60 MG PO TABS
30.0000 mg | ORAL_TABLET | Freq: Four times a day (QID) | ORAL | Status: DC
  Administered 2019-03-19 – 2019-03-24 (×17): 30 mg via ORAL
  Filled 2019-03-19 (×22): qty 1

## 2019-03-19 MED ORDER — SODIUM CHLORIDE 0.9 % IV SOLN
2.0000 g | INTRAVENOUS | Status: DC
  Administered 2019-03-19 – 2019-03-21 (×3): 2 g via INTRAVENOUS
  Filled 2019-03-19 (×5): qty 20

## 2019-03-19 MED ORDER — SODIUM CHLORIDE 0.9 % IV BOLUS
500.0000 mL | Freq: Once | INTRAVENOUS | Status: AC
  Administered 2019-03-19: 500 mL via INTRAVENOUS

## 2019-03-19 MED ORDER — POTASSIUM CHLORIDE CRYS ER 20 MEQ PO TBCR
40.0000 meq | EXTENDED_RELEASE_TABLET | Freq: Once | ORAL | Status: AC
  Administered 2019-03-19: 40 meq via ORAL
  Filled 2019-03-19: qty 2

## 2019-03-19 MED ORDER — DONEPEZIL HCL 10 MG PO TABS
10.0000 mg | ORAL_TABLET | Freq: Every day | ORAL | Status: DC
  Administered 2019-03-19 – 2019-03-25 (×7): 10 mg via ORAL
  Filled 2019-03-19 (×7): qty 1

## 2019-03-19 MED ORDER — WARFARIN - PHARMACIST DOSING INPATIENT
Freq: Every day | Status: DC
  Administered 2019-03-20 – 2019-03-23 (×3)

## 2019-03-19 MED ORDER — ENSURE ENLIVE PO LIQD
237.0000 mL | Freq: Two times a day (BID) | ORAL | Status: DC
  Administered 2019-03-19 – 2019-03-25 (×13): 237 mL via ORAL

## 2019-03-19 NOTE — Progress Notes (Signed)
Wife called Probation officer updated her on status of patient she reported patient had poor appetitie at home not eating well at all he refused breakfast tray this am,he conts on IV fluids to keep him hydrated. NO further changes noted.

## 2019-03-19 NOTE — Progress Notes (Addendum)
   03/18/19 2337  Vitals  Temp 98.6 F (37 C)  BP 106/62  MAP (mmHg) 76  BP Location Left Arm  BP Method Automatic  Patient Position (if appropriate) Lying  Pulse Rate (!) 111  Pulse Rate Source Monitor  Resp 18  Oxygen Therapy  SpO2 97 %  Pain Assessment  Pain Scale 0-10  Pain Score 0  Height and Weight  Height 5\' 10"  (1.778 m)  Weight 82.5 kg  Type of Scale Used Bed  BSA (Calculated - sq m) 2.02 sq meters  BMI (Calculated) 26.1  Weight in (lb) to have BMI = 25 173.9  MEWS Score  MEWS RR 0  MEWS Pulse 2  MEWS Systolic 0  MEWS LOC 0  MEWS Temp 0  MEWS Score 2  MEWS Score Color Yellow  Pt arrived to unit.  No pain per pt. Placed in tele, and weight in the bed.  Alert to self and time. Forgetful as to location and why he is here.  Attempted to educate patient on call bell and it is within reach.  Unable to complete all of admission assessment due to patient confusion and inability to answer at this time.  Patient in atrial fibrillation at time of admission with HR ranging from 112-120. Offered patient food and drink, patient declined.  Patient also arrived with knee brace on from recent surgery.  Will continue to monitor.

## 2019-03-19 NOTE — Progress Notes (Signed)
PROGRESS NOTE    Russell Dillon  VPX:[id number] DOB: 30-Apr-1946 DOA: 03/18/2019 PCP: Marton Redwood, MD    Brief Narrative: 73 year old with past medical history significant for chronic A. fib on Coumadin, hyperlipidemia, hypertension recent patellar tendon rupture status post surgery 3 weeks ago, dementia and bipolar who came to the ED with confusion, hematuria and fever.  Patient was recently treated orally for UTI by his primary care doctor with Levaquin.   She is admitted with sepsis from urinary tract infection.  Due to recent knee surgery orthopedic was consulted.  They evaluated the patient and they do not think that patient's knee is infected. Patient 1 out of 2 blood cultures is growing E. coli.  Assessment & Plan:   Principal Problem:   Sepsis, Gram negative ([place]) Active Problems:   GERD   Atrial fibrillation, chronic   Long term (current) use of anticoagulants   HTN (hypertension)   Hyperlipidemia   Dementia without behavioral disturbance (HCC)   Leukocytosis   Atrial fibrillation (HCC)  1-sepsis secondary to urinary tract infection: Continue with IV fluids, IV ceftriaxone increase dose to 2 g. Cultures positive for E. coli.  Follow sensitivity. Renal ultrasound performed and it was negative for hydronephrosis or pyelonephritis.  Showed diverticuli enlargement of prostate gland.  Review ultrasound with urology who recommend for patient to follow-up as an outpatient. Is for patient to progressive care unit to monitor blood pressure and heart rate closely.  2-A. fib with RVR: Will resume Cardizem lower dose, will and hold her parameters for blood pressure. Continue to hold Coumadin INR is still elevated. Her wife patient was advised not to take Coumadin from his outpatient INR clinic due to elevated INR  3-Dementia: We will hold lithium avoid hypotension.  4-Acute metabolic encephalopathy: Likely related to acute infection and sepsis.  5-hyperlipidemia; per wife  patient was taking off of his statin 6-hypokalemia replete orally 7-recent Left knee Sx, tendon rupture repair. orthopedic informed of admission.   Estimated body mass index is 26.76 kg/m as calculated from the following:   Height as of this encounter: 5\' 10"  (1.778 m).   Weight as of this encounter: 84.6 kg.   DVT prophylaxis: Coumadin, supratherapeutic INR Code Status: Full code Family Communication: Wife updated over the phone Disposition Plan: Main in the hospital for treatment of sepsis, low blood pressure, required IV fluids IV antibiotics, transfer patient to the progressive care unit   Consultants:   Ortho   Procedures:  Renal ultrasound; Areas of bladder wall thickening, potentially related to trabeculation. 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  2. No hydronephrosis.   Antimicrobials:   Ceftriaxone   Subjective: Patient appears ill, is tired, confused, he denies dysuria or back pain.  He is complaining of left knee pain  Objective: Vitals:   03/18/19 2337 03/19/19 0418 03/19/19 0750 03/19/19 0853  BP: 106/62 109/68 111/62 128/73  Pulse: (!) 111 71 89 (!) 118  Resp: 18 18 18 16   Temp: 98.6 F (37 C) 98.7 F (37.1 C) 98.6 F (37 C) (!) 97.4 F (36.3 C)  TempSrc:  Oral Oral Oral  SpO2: 97% 97% 97% 99%  Weight: 82.5 kg 84.6 kg    Height: 5\' 10"  (1.778 m)       Intake/Output Summary (Last 24 hours) at 03/19/2019 1000 Last data filed at 03/19/2019 0841 Gross per 24 hour  Intake 1319.06 ml  Output 400 ml  Net 919.06  ml   Filed Weights   03/18/19 2337 03/19/19 0418  Weight: 82.5 kg 84.6 kg    Examination:  General exam: Appears calm and comfortable  Respiratory system: Clear to auscultation. Respiratory effort normal. Cardiovascular system: S1 & S2 heard, RRR. No JVD, murmurs, rubs, gallops or clicks. No pedal edema. Gastrointestinal system:  Abdomen is nondistended, soft and nontender. No organomegaly or masses felt. Normal bowel sounds heard. Central nervous system: sleepy Extremities: Symmetric 5 x 5 power.  Left knee, incision healing, no redness  Skin: No rashes, lesions or ulcers    Data Reviewed: I have personally reviewed following labs and imaging studies  CBC: Recent Labs  Lab 03/18/19 1819 03/19/19 0241  WBC 22.5* 22.0*  NEUTROABS 20.5*  --   HGB 12.5* 11.2*  HCT 39.8 35.1*  MCV 87.3 85.6  PLT 409* 595   Basic Metabolic Panel: Recent Labs  Lab 03/18/19 1819 03/19/19 0241  NA 137 140  K 3.5 3.4*  CL 105 107  CO2 20* 22  GLUCOSE 134* 126*  BUN 17 15  CREATININE 1.06 1.06  CALCIUM 8.8* 8.3*   GFR: Estimated Creatinine Clearance: 63.1 mL/min (by C-G formula based on SCr of 1.06 mg/dL). Liver Function Tests: Recent Labs  Lab 03/18/19 1819 03/19/19 0241  AST 17 15  ALT 40 29  ALKPHOS 113 102  BILITOT 1.4* 0.9  PROT 7.1 6.3*  ALBUMIN 2.6* 2.2*   No results for input(s): LIPASE, AMYLASE in the last 168 hours. No results for input(s): AMMONIA in the last 168 hours. Coagulation Profile: Recent Labs  Lab 03/18/19 1819 03/19/19 0732  INR 4.0* 4.4*   Cardiac Enzymes: No results for input(s): CKTOTAL, CKMB, CKMBINDEX, TROPONINI in the last 168 hours. BNP (last 3 results) No results for input(s): PROBNP in the last 8760 hours. HbA1C: No results for input(s): HGBA1C in the last 72 hours. CBG: No results for input(s): GLUCAP in the last 168 hours. Lipid Profile: No results for input(s): CHOL, HDL, LDLCALC, TRIG, CHOLHDL, LDLDIRECT in the last 72 hours. Thyroid Function Tests: Recent Labs    03/18/19 2339  TSH 2.128   Anemia Panel: No results for input(s): VITAMINB12, FOLATE, FERRITIN, TIBC, IRON, RETICCTPCT in the last 72 hours. Sepsis Labs: Recent Labs  Lab 03/18/19 1835 03/18/19 2339  LATICACIDVEN 1.4 1.1    Recent Results (from the past 240 hour(s))  Blood culture (routine  x 2)     Status: None (Preliminary result)   Collection Time: 03/18/19  6:56 PM  Result Value Ref Range Status   Specimen Description BLOOD RIGHT ANTECUBITAL  Final   Special Requests   Final    BOTTLES DRAWN AEROBIC AND ANAEROBIC Blood Culture adequate volume   Culture   Final    NO GROWTH < 12 HOURS Performed at [facility], [place] [address] [place], [place] [address]    Report Status PENDING  Incomplete  Blood culture (routine x 2)     Status: None (Preliminary result)   Collection Time: 03/18/19  7:05 PM  Result Value Ref Range Status   Specimen Description BLOOD BLOOD RIGHT FOREARM  Final   Special Requests   Final    BOTTLES DRAWN AEROBIC AND ANAEROBIC Blood Culture results may not be optimal due to an inadequate volume of blood received in culture bottles   Culture   Final    NO GROWTH < 12 HOURS Performed at [facility], [place] [address], [place], [place] [address]    Report  Status PENDING  Incomplete  SARS Coronavirus 2 (CEPHEID - Performed in [place] hospital lab), Hosp Order     Status: None   Collection Time: 03/18/19  7:28 PM  Result Value Ref Range Status   SARS Coronavirus 2 NEGATIVE NEGATIVE Final    Comment: (NOTE) If result is NEGATIVE SARS-CoV-2 target nucleic acids are NOT DETECTED. The SARS-CoV-2 RNA is generally detectable in upper and lower  respiratory specimens during the acute phase of infection. The lowest  concentration of SARS-CoV-2 viral copies this assay can detect is 250  copies / mL. A negative result does not preclude SARS-CoV-2 infection  and should not be used as the sole basis for treatment or other  patient management decisions.  A negative result may occur with  improper specimen collection / handling, submission of specimen other  than nasopharyngeal swab, presence of viral mutation(s) within the  areas targeted by this assay, and inadequate number of viral copies  (<250 copies / mL). A negative result must be combined with  clinical  observations, patient history, and epidemiological information. If result is POSITIVE SARS-CoV-2 target nucleic acids are DETECTED. The SARS-CoV-2 RNA is generally detectable in upper and lower  respiratory specimens dur ing the acute phase of infection.  Positive  results are indicative of active infection with SARS-CoV-2.  Clinical  correlation with patient history and other diagnostic information is  necessary to determine patient infection status.  Positive results do  not rule out bacterial infection or co-infection with other viruses. If result is PRESUMPTIVE POSTIVE SARS-CoV-2 nucleic acids MAY BE PRESENT.   A presumptive positive result was obtained on the submitted specimen  and confirmed on repeat testing.  While 2019 novel coronavirus  (SARS-CoV-2) nucleic acids may be present in the submitted sample  additional confirmatory testing may be necessary for epidemiological  and / or clinical management purposes  to differentiate between  SARS-CoV-2 and other Sarbecovirus currently known to infect humans.  If clinically indicated additional testing with an alternate test  methodology [phone]) is advised. The SARS-CoV-2 RNA is generally  detectable in upper and lower respiratory sp ecimens during the acute  phase of infection. The expected result is Negative. Fact Sheet for Patients:  StrictlyIdeas.no Fact Sheet for Healthcare Providers: BankingDealers.co.za This test is not yet approved or cleared by the [place] FDA and has been authorized for detection and/or diagnosis of SARS-CoV-2 by FDA under an Emergency Use Authorization (EUA).  This EUA will remain in effect (meaning this test can be used) for the duration of the COVID-19 declaration under Section 564(b)(1) of the Act, 21 U.S.C. section 360bbb-3(b)(1), unless the authorization is terminated or revoked sooner. Performed at [facility], [place]  [address] [place],  [address]          Radiology Studies: Ct Head Wo Contrast  Result Date: 03/18/2019 CLINICAL DATA:  Altered level of consciousness, confusion. EXAM: CT HEAD WITHOUT CONTRAST TECHNIQUE: Contiguous axial images were obtained from the base of the skull through the vertex without intravenous contrast. COMPARISON:  02/08/2019 FINDINGS: Brain: There is atrophy and chronic small vessel disease changes. No acute intracranial abnormality. Specifically, no hemorrhage, hydrocephalus, mass lesion, acute infarction, or significant intracranial injury. Vascular: No hyperdense vessel or unexpected calcification. Skull: No acute calvarial abnormality. Sinuses/Orbits: Visualized paranasal sinuses and mastoids clear. Orbital soft tissues unremarkable. Other: None IMPRESSION: Atrophy, chronic microvascular disease. No acute intracranial abnormality. Electronically Signed   By: Rolm Baptise M.D.   On: 03/18/2019 22:22  Dg Chest Port 1 View  Result Date: 03/18/2019 CLINICAL DATA:  Altered mental status. EXAM: PORTABLE CHEST 1 VIEW COMPARISON:  Radiograph of February 08, 2019. FINDINGS: The heart size and mediastinal contours are within normal limits. Both lungs are clear. The visualized skeletal structures are unremarkable. IMPRESSION: No active disease. Electronically Signed   By: Marijo Conception M.D.   On: 03/18/2019 18:54        Scheduled Meds: . diltiazem  30 mg Oral Q6H   Continuous Infusions: . sodium chloride 100 mL/hr at 03/19/19 0905  . cefTRIAXone (ROCEPHIN)  IV    . sodium chloride    . sodium chloride       LOS: 1 day    Time spent: 35 minutes    Elmarie Shiley, MD [facility] Pager [phone] If 7PM-7AM, please contact night-coverage [email or web address] Password [email or web address] 03/19/2019, 10:00 AM

## 2019-03-19 NOTE — Progress Notes (Signed)
  Pt orientation to unit, room and routine. Information packet given to patient/family and safety video watched.  Admission INP armband ID verified with patient/family, and in place. SR up x 2, fall risk assessment complete with Patient and family verbalizing understanding of risks associated with falls. Pt verbalizes an understanding of how to use the call bell and to call for help before getting out of bed.  Skin, warm and dry to touch, redness noted in rectum area protective ointment applyed, left cast intact no redness or drainage noted. Patient placed on the progressive monitor.  Will cont to monitor and assist as needed.  Hosie Spangle, RN 03/19/2019 3:43 PM

## 2019-03-19 NOTE — Progress Notes (Signed)
ANTICOAGULATION CONSULT NOTE - Follow Up Consult  Pharmacy Consult for Coumadin Indication: atrial fibrillation  No Known Allergies  Patient Measurements: Height: 5\' 10"  (177.8 cm) Weight: 186 lb 8.2 oz (84.6 kg) IBW/kg (Calculated) : 73  Vital Signs: Temp: 97.4 F (36.3 C) (05/08 0853) Temp Source: Oral (05/08 0853) BP: 128/73 (05/08 0853) Pulse Rate: 118 (05/08 0853)  Labs: Recent Labs    03/18/19 1819 03/19/19 0241 03/19/19 0732  HGB 12.5* 11.2*  --   HCT 39.8 35.1*  --   PLT 409* 374  --   LABPROT 38.3*  --  41.0*  INR 4.0*  --  4.4*  CREATININE 1.06 1.06  --     Estimated Creatinine Clearance: 63.1 mL/min (by C-G formula based on SCr of 1.06 mg/dL).   Medications:  Coumadin PTA regimen: 5mg  Mon, Wed, Fri, Sat; 2.5mg  Tue, Thurs, Sun  Assessment: 73 year old male admitted with pyelonephritis and E coli bacteremia. He is on chronic anticoagulation with Coumadin for atrial fibrillation. His INR is supratherapeutic, likely due to acute illness and recent Levaquin therapy.  Goal of Therapy:  INR 2-3 Monitor platelets by anticoagulation protocol: Yes   Plan:  No Coumadin today Daily PT/INR  Monitor for bleeding complications  Legrand Como, Pharm.D., BCPS, BCIDP Clinical Pharmacist Phone: [phone] Please check AMION for all [place] numbers 03/19/2019, 10:42 AM

## 2019-03-19 NOTE — Progress Notes (Signed)
Warm blankets applied to patient. Md responded to page writer corrected resp to reflect better MEWS score and MD reported wanted EKG for elevated HR last pm as well as fluid bolus. No further orders noted.

## 2019-03-19 NOTE — Consult Note (Signed)
Reason for Consult:R/o septic joint Referring Physician: B Aarush Dillon is an 73 y.o. male.  HPI: Russell Dillon came to the hospital yesterday c/o fevers, chills, and confusion. He was found to be septic and this was ultimately traced to a UTI. However he is 5 weeks s/p patella tendon repair and given the chronicity wanted to be sure there wasn't also a surgical site infection. He notes the knee is sore but doesn't feel any more painful than he did in the past week.  Past Medical History:  Diagnosis Date  . Aortic valve regurgitation 10/16/2018   Echo 07/28/2017:  EF 60-65, moderate AI, aortic root and ascending aorta mildly dilated (40 mm), ascending aorta 43 mm, MAC, mild MR, mild LAE, moderate RV enlargement, trivial TR // Echo 12/19: EF 60-65, normal wall motion, mild AI, moderate BAE, ascending aorta 43 mm, aortic root 39 mm  . BPH (benign prostatic hypertrophy)   . Chronic anticoagulation   . Chronic atrial fibrillation   . Diastolic dysfunction October 2010   Normal LV systolic function  . Hx SBO   . Hyperlipidemia   . Hypertension   . Patellar tendon rupture, left, initial encounter 02/10/2019  . Small bowel obstruction [facility])     Past Surgical History:  Procedure Laterality Date  . APPENDECTOMY  2004  . APPLICATION OF WOUND VAC Left 02/11/2019   Procedure: Application Of Wound Vac;  Surgeon: Russell Acworth, MD;  Location: [place];  Service: Orthopedics;  Laterality: Left;  . CARDIOVASCULAR STRESS TEST  09/30/2007   EF 67%  No ischemia.   Marland Kitchen CARDIOVERSION  05/01/2004  . KNEE ARTHROSCOPY Left 02/11/2019   Procedure: ARTHROSCOPY LEFT KNEE;  Surgeon: Russell Berwind, MD;  Location: [place];  Service: Orthopedics;  Laterality: Left;  . PATELLAR TENDON REPAIR Left 02/11/2019   Procedure: LEFT PATELLA TENDON REPAIR;  Surgeon: Russell Bandera, MD;  Location: [place];  Service: Orthopedics;  Laterality: Left;  . SMALL INTESTINE SURGERY  2004  . US ECHOCARDIOGRAPHY  09/05/2009   ef 55-60%     Family History  Problem Relation Age of Onset  . Heart attack Mother   . Hypertension Mother   . Diabetes Father   . Heart disease Father   . Colon cancer Neg Hx   . Esophageal cancer Neg Hx   . Pancreatic cancer Neg Hx   . Prostate cancer Neg Hx   . Rectal cancer Neg Hx   . Stomach cancer Neg Hx     Social History:  reports that he has never smoked. He has never used smokeless tobacco. He reports that he does not drink alcohol or use drugs.  Allergies: No Known Allergies  Medications: I have reviewed the patient's current medications.  Results for orders placed or performed during the hospital encounter of 03/18/19 (from the past 48 hour(s))  CBC with Differential/Platelet     Status: Abnormal   Collection Time: 03/18/19  6:19 PM  Result Value Ref Range   WBC 22.5 (H) 4.0 - 10.5 K/uL   RBC 4.56 4.22 - 5.81 MIL/uL   Hemoglobin 12.5 (L) 13.0 - 17.0 g/dL   HCT 39.8 39.0 - 52.0 %   MCV 87.3 80.0 - 100.0 fL   MCH 27.4 26.0 - 34.0 pg   MCHC 31.4 30.0 - 36.0 g/dL   RDW 18.4 (H) 11.5 - 15.5 %   Platelets 409 (H) 150 - 400 K/uL   nRBC 0.0 0.0 - 0.2 %   Neutrophils Relative % 91 %  Neutro Abs 20.5 (H) 1.7 - 7.7 K/uL   Lymphocytes Relative 4 %   Lymphs Abs 0.8 0.7 - 4.0 K/uL   Monocytes Relative 4 %   Monocytes Absolute 0.9 0.1 - 1.0 K/uL   Eosinophils Relative 0 %   Eosinophils Absolute 0.0 0.0 - 0.5 K/uL   Basophils Relative 0 %   Basophils Absolute 0.0 0.0 - 0.1 K/uL   Immature Granulocytes 1 %   Abs Immature Granulocytes 0.19 (H) 0.00 - 0.07 K/uL    Comment: Performed at [facility] [address] [place], [place] [address]  Comprehensive metabolic panel     Status: Abnormal   Collection Time: 03/18/19  6:19 PM  Result Value Ref Range   Sodium 137 135 - 145 mmol/L   Potassium 3.5 3.5 - 5.1 mmol/L   Chloride 105 98 - 111 mmol/L   CO2 20 (L) 22 - 32 mmol/L   Glucose, Bld 134 (H) 70 - 99 mg/dL   BUN 17 8 - 23 mg/dL   Creatinine, Ser 1.06 0.61 - 1.24  mg/dL   Calcium 8.8 (L) 8.9 - 10.3 mg/dL   Total Protein 7.1 6.5 - 8.1 g/dL   Albumin 2.6 (L) 3.5 - 5.0 g/dL   AST 17 15 - 41 U/L   ALT 40 0 - 44 U/L   Alkaline Phosphatase 113 38 - 126 U/L   Total Bilirubin 1.4 (H) 0.3 - 1.2 mg/dL   GFR calc non Af Amer >60 >60 mL/min   GFR calc Af Amer >60 >60 mL/min   Anion gap 12 5 - 15    Comment: Performed at [facility] [address] [place], [place] [address]  Lithium level     Status: Abnormal   Collection Time: 03/18/19  6:19 PM  Result Value Ref Range   Lithium Lvl 0.17 (L) 0.60 - 1.20 mmol/L    Comment: Performed at [facility] [address], [place], [place] [address]  Protime-INR     Status: Abnormal   Collection Time: 03/18/19  6:19 PM  Result Value Ref Range   Prothrombin Time 38.3 (H) 11.4 - 15.2 seconds   INR 4.0 (H) 0.8 - 1.2    Comment: (NOTE) INR goal varies based on device and disease states. Performed at [facility], [place] [address], [place], [place] [address]   Lactic acid, plasma     Status: None   Collection Time: 03/18/19  6:35 PM  Result Value Ref Range   Lactic Acid, Venous 1.4 0.5 - 1.9 mmol/L    Comment: Performed at [facility] [address], [place], [place] [address]  Blood culture (routine x 2)     Status: None (Preliminary result)   Collection Time: 03/18/19  6:56 PM  Result Value Ref Range   Specimen Description BLOOD RIGHT ANTECUBITAL    Special Requests      BOTTLES DRAWN AEROBIC AND ANAEROBIC Blood Culture adequate volume   Culture      NO GROWTH < 12 HOURS Performed at [facility] [address], [place], [place] [address]    Report Status PENDING   Blood culture (routine x 2)     Status: None (Preliminary result)   Collection Time: 03/18/19  7:05 PM  Result Value Ref Range   Specimen Description BLOOD BLOOD RIGHT FOREARM    Special Requests      BOTTLES DRAWN AEROBIC AND ANAEROBIC Blood Culture results may not be  optimal due to an inadequate volume of  blood received in culture bottles   Culture  Setup Time      GRAM NEGATIVE RODS ANAEROBIC BOTTLE ONLY CRITICAL RESULT CALLED TO, READ BACK BY AND VERIFIED WITH: PHARMD R FANNING [id number] AT 1034 AM BY CM Performed at [facility], [place] [address], [place], [place] [address]    Culture GRAM NEGATIVE RODS    Report Status PENDING   Blood Culture ID Panel (Reflexed)     Status: Abnormal   Collection Time: 03/18/19  7:05 PM  Result Value Ref Range   Enterococcus species NOT DETECTED NOT DETECTED   Listeria monocytogenes NOT DETECTED NOT DETECTED   Staphylococcus species NOT DETECTED NOT DETECTED   Staphylococcus aureus (BCID) NOT DETECTED NOT DETECTED   Streptococcus species NOT DETECTED NOT DETECTED   Streptococcus agalactiae NOT DETECTED NOT DETECTED   Streptococcus pneumoniae NOT DETECTED NOT DETECTED   Streptococcus pyogenes NOT DETECTED NOT DETECTED   Acinetobacter baumannii NOT DETECTED NOT DETECTED   Enterobacteriaceae species DETECTED (A) NOT DETECTED    Comment: Enterobacteriaceae represent a large family of gram-negative bacteria, not a single organism. CRITICAL RESULT CALLED TO, READ BACK BY AND VERIFIED WITH: PHARMD R FANNING [id number] AT 1034 AM BY CM    Enterobacter cloacae complex NOT DETECTED NOT DETECTED   Escherichia coli DETECTED (A) NOT DETECTED    Comment: CRITICAL RESULT CALLED TO, READ BACK BY AND VERIFIED WITH: PHARMD R FANNING [id number] AT 1034 BY CM    Klebsiella oxytoca NOT DETECTED NOT DETECTED   Klebsiella pneumoniae NOT DETECTED NOT DETECTED   Proteus species NOT DETECTED NOT DETECTED   Serratia marcescens NOT DETECTED NOT DETECTED   Carbapenem resistance NOT DETECTED NOT DETECTED   Haemophilus influenzae NOT DETECTED NOT DETECTED   Neisseria meningitidis NOT DETECTED NOT DETECTED   Pseudomonas aeruginosa NOT DETECTED NOT DETECTED   Candida albicans NOT DETECTED NOT DETECTED   Candida glabrata NOT DETECTED NOT DETECTED   Candida krusei NOT DETECTED NOT  DETECTED   Candida parapsilosis NOT DETECTED NOT DETECTED   Candida tropicalis NOT DETECTED NOT DETECTED    Comment: Performed at [facility], [address], [place], [place] [address]  SARS Coronavirus 2 (CEPHEID - Performed in [facility] hospital lab), Hosp Order     Status: None   Collection Time: 03/18/19  7:28 PM  Result Value Ref Range   SARS Coronavirus 2 NEGATIVE NEGATIVE    Comment: (NOTE) If result is NEGATIVE SARS-CoV-2 target nucleic acids are NOT DETECTED. The SARS-CoV-2 RNA is generally detectable in upper and lower  respiratory specimens during the acute phase of infection. The lowest  concentration of SARS-CoV-2 viral copies this assay can detect is 250  copies / mL. A negative result does not preclude SARS-CoV-2 infection  and should not be used as the sole basis for treatment or other  patient management decisions.  A negative result may occur with  improper specimen collection / handling, submission of specimen other  than nasopharyngeal swab, presence of viral mutation(s) within the  areas targeted by this assay, and inadequate number of viral copies  (<250 copies / mL). A negative result must be combined with clinical  observations, patient history, and epidemiological information. If result is POSITIVE SARS-CoV-2 target nucleic acids are DETECTED. The SARS-CoV-2 RNA is generally detectable in upper and lower  respiratory specimens dur ing the acute phase of infection.  Positive  results are indicative of active infection with SARS-CoV-2.  Clinical  correlation with patient history and other diagnostic information is  necessary to determine patient infection status.  Positive results do  not rule out bacterial infection or co-infection with other viruses. If result is PRESUMPTIVE POSTIVE SARS-CoV-2 nucleic acids MAY BE PRESENT.   A presumptive positive result was obtained on the submitted specimen  and confirmed on repeat testing.  While 2019 novel  coronavirus  (SARS-CoV-2) nucleic acids may be present in the submitted sample  additional confirmatory testing may be necessary for epidemiological  and / or clinical management purposes  to differentiate between  SARS-CoV-2 and other Sarbecovirus currently known to infect humans.  If clinically indicated additional testing with an alternate test  methodology [phone]) is advised. The SARS-CoV-2 RNA is generally  detectable in upper and lower respiratory sp ecimens during the acute  phase of infection. The expected result is Negative. Fact Sheet for Patients:  StrictlyIdeas.no Fact Sheet for Healthcare Providers: BankingDealers.co.za This test is not yet approved or cleared by the [place] FDA and has been authorized for detection and/or diagnosis of SARS-CoV-2 by FDA under an Emergency Use Authorization (EUA).  This EUA will remain in effect (meaning this test can be used) for the duration of the COVID-19 declaration under Section 564(Russell)(1) of the Act, 21 U.S.C. section 360bbb-3(Russell)(1), unless the authorization is terminated or revoked sooner. Performed at [facility], [place] [address] [place], [place] [address]   Urinalysis, Routine w reflex microscopic     Status: Abnormal   Collection Time: 03/18/19  9:46 PM  Result Value Ref Range   Color, Urine YELLOW YELLOW   APPearance CLOUDY (A) CLEAR   Specific Gravity, Urine 1.010 1.005 - 1.030   pH 6.0 5.0 - 8.0   Glucose, UA 50 (A) NEGATIVE mg/dL   Hgb urine dipstick MODERATE (A) NEGATIVE   Bilirubin Urine NEGATIVE NEGATIVE   Ketones, ur NEGATIVE NEGATIVE mg/dL   Protein, ur 30 (A) NEGATIVE mg/dL   Nitrite POSITIVE (A) NEGATIVE   Leukocytes,Ua LARGE (A) NEGATIVE   RBC / HPF 21-50 0 - 5 RBC/hpf   WBC, UA >50 (H) 0 - 5 WBC/hpf   Bacteria, UA NONE SEEN NONE SEEN   WBC Clumps PRESENT     Comment: Performed at [facility], [address], [place], [place] [address]   Lactic acid, plasma     Status: None   Collection Time: 03/18/19 11:39 PM  Result Value Ref Range   Lactic Acid, Venous 1.1 0.5 - 1.9 mmol/L    Comment: Performed at [facility] [address] [place], [place] [address]  TSH     Status: None   Collection Time: 03/18/19 11:39 PM  Result Value Ref Range   TSH 2.128 0.350 - 4.500 uIU/mL    Comment: Performed by a 3rd Generation assay with a functional sensitivity of <=0.01 uIU/mL. Performed at [facility], [place] [address], [place],  [address]   Comprehensive metabolic panel     Status: Abnormal   Collection Time: 03/19/19  2:41 AM  Result Value Ref Range   Sodium 140 135 - 145 mmol/L   Potassium 3.4 (L) 3.5 - 5.1 mmol/L   Chloride 107 98 - 111 mmol/L   CO2 22 22 - 32 mmol/L   Glucose, Bld 126 (H) 70 - 99 mg/dL   BUN 15 8 - 23 mg/dL   Creatinine, Ser 1.06 0.61 - 1.24 mg/dL   Calcium 8.3 (L) 8.9 - 10.3 mg/dL  Total Protein 6.3 (L) 6.5 - 8.1 g/dL   Albumin 2.2 (L) 3.5 - 5.0 g/dL   AST 15 15 - 41 U/L   ALT 29 0 - 44 U/L   Alkaline Phosphatase 102 38 - 126 U/L   Total Bilirubin 0.9 0.3 - 1.2 mg/dL   GFR calc non Af Amer >60 >60 mL/min   GFR calc Af Amer >60 >60 mL/min   Anion gap 11 5 - 15    Comment: Performed at [facility] [address] [place], [place] [address]  CBC     Status: Abnormal   Collection Time: 03/19/19  2:41 AM  Result Value Ref Range   WBC 22.0 (H) 4.0 - 10.5 K/uL   RBC 4.10 (L) 4.22 - 5.81 MIL/uL   Hemoglobin 11.2 (L) 13.0 - 17.0 g/dL   HCT 35.1 (L) 39.0 - 52.0 %   MCV 85.6 80.0 - 100.0 fL   MCH 27.3 26.0 - 34.0 pg   MCHC 31.9 30.0 - 36.0 g/dL   RDW 18.4 (H) 11.5 - 15.5 %   Platelets 374 150 - 400 K/uL   nRBC 0.0 0.0 - 0.2 %    Comment: Performed at [facility], [place] [address] [place], [place] [address]  Protime-INR     Status: Abnormal   Collection Time: 03/19/19  7:32 AM  Result Value Ref Range   Prothrombin Time 41.0 (H) 11.4 - 15.2 seconds   INR 4.4 (HH) 0.8  - 1.2    Comment: REPEATED TO VERIFY CRITICAL RESULT CALLED TO, READ BACK BY AND VERIFIED WITH: Russell.JOHNSON,RN 03/19/2019 0828 DAVISB (NOTE) INR goal varies based on device and disease states. Performed at [facility], [place] [address] [place], [place] [address]     Ct Head Wo Contrast  Result Date: 03/18/2019 CLINICAL DATA:  Altered level of consciousness, confusion. EXAM: CT HEAD WITHOUT CONTRAST TECHNIQUE: Contiguous axial images were obtained from the base of the skull through the vertex without intravenous contrast. COMPARISON:  02/08/2019 FINDINGS: Brain: There is atrophy and chronic small vessel disease changes. No acute intracranial abnormality. Specifically, no hemorrhage, hydrocephalus, mass lesion, acute infarction, or significant intracranial injury. Vascular: No hyperdense vessel or unexpected calcification. Skull: No acute calvarial abnormality. Sinuses/Orbits: Visualized paranasal sinuses and mastoids clear. Orbital soft tissues unremarkable. Other: None IMPRESSION: Atrophy, chronic microvascular disease. No acute intracranial abnormality. Electronically Signed   By: Rolm Baptise M.D.   On: 03/18/2019 22:22   US Renal  Result Date: 03/19/2019 CLINICAL DATA:  Urinary tract infection EXAM: RENAL / URINARY TRACT ULTRASOUND COMPLETE COMPARISON:  CT scan 02/08/2019 FINDINGS: Right Kidney: Renal measurements: 11.0 x 6.2 x 5.6 cm = volume: 201 ML. Cortical thinning without hydronephrosis. Parenchymal echotexture appears increased. Left Kidney: Renal measurements: 11.2 x 7.4 x 7.3 cm = volume: 318 mL. Poorly visualized but echotexture may be increased. No hydronephrosis evident. Bladder: Appears normal for degree of bladder distention. Areas of apparent focal bladder wall thickening may be related to trabeculation. Note: Prostate gland is markedly enlarged. IMPRESSION: 1. Areas of bladder wall thickening, potentially related to trabeculation. 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 2. No hydronephrosis. Electronically Signed   By: Misty Stanley M.D.   On: 03/19/2019 11:24   Dg Chest Port 1 View  Result Date: 03/18/2019 CLINICAL DATA:  Altered mental status. EXAM: PORTABLE CHEST 1 VIEW COMPARISON:  Radiograph of February 08, 2019. FINDINGS:  The heart size and mediastinal contours are within normal limits. Both lungs are clear. The visualized skeletal structures are unremarkable. IMPRESSION: No active disease. Electronically Signed   By: Marijo Conception M.D.   On: 03/18/2019 18:54    Review of Systems  Constitutional: Positive for chills and fever. Negative for weight loss.  HENT: Negative for ear discharge, ear pain, hearing loss and tinnitus.   Eyes: Negative for blurred vision, double vision, photophobia and pain.  Respiratory: Negative for cough, sputum production and shortness of breath.   Cardiovascular: Negative for chest pain.  Gastrointestinal: Negative for abdominal pain, nausea and vomiting.  Genitourinary: Negative for dysuria, flank pain, frequency and urgency.  Musculoskeletal: Positive for joint pain (Right knee). Negative for back pain, falls, myalgias and neck pain.  Neurological: Negative for dizziness, tingling, sensory change, focal weakness, loss of consciousness and headaches.  Endo/Heme/Allergies: Does not bruise/bleed easily.  Psychiatric/Behavioral: Negative for depression, memory loss and substance abuse. The patient is not nervous/anxious.    Blood pressure (!) 91/56, pulse (!) 107, temperature 97.6 F (36.4 C), temperature source Oral, resp. rate 18, height _0  (1.778 m), weight 84.6 kg, SpO2 95 %. Physical Exam  Constitutional: He appears well-developed and well-nourished. No distress.  HENT:  Head: Normocephalic and atraumatic.  Eyes: Conjunctivae are normal. Right eye exhibits no discharge. Left eye exhibits no discharge. No scleral icterus.   Neck: Normal range of motion.  Cardiovascular: Normal rate and regular rhythm.  Respiratory: Effort normal. No respiratory distress.  Musculoskeletal:     Comments: RLE No traumatic wounds, ecchymosis, or rash  Knee mod effusion, diffuse mod TTP, no erythema, no warmth  No ankle effusion  Sens DPN, SPN, TN intact  Motor EHL, ext, flex, evers 5/5  DP 1+, PT 1+, No significant edema  Neurological: He is alert.  Skin: Skin is warm and dry. He is not diaphoretic.  Psychiatric: He has a normal mood and affect. His behavior is normal.    Assessment/Plan: S/p left patella tendon repair -- Given length of time from surgery, alternative explanation for sepsis, lack of increased pain, and lack of warmth or redness do not think patient has infected surgical site and do not think aspiration is warranted. Sepsis with confirmed UTI by UA    Lisette Abu, PA-C Orthopedic Surgery [phone] 03/19/2019, 11:30 AM

## 2019-03-19 NOTE — Progress Notes (Signed)
   03/19/19 0200  Pain Assessment  Pain Scale 0-10  Pain Score Asleep  Provider Notification  Provider Name/Title Bodenheimer, NP  Date Provider Notified 03/19/19  Time Provider Notified [phone]  Notification Type Page  Notification Reason Other (Comment)   RN paged triad about pt elevated HR ranging from lower 110s to upper 120s.  RN told to watch HR and if it stayed consistently elevated to page again.

## 2019-03-19 NOTE — Progress Notes (Signed)
Patient transferred in his bed to [facility] room 32 report given to [place]. Patient's wife informed of transfer [place] and the reason for transfer.

## 2019-03-19 NOTE — Plan of Care (Signed)

## 2019-03-19 NOTE — Progress Notes (Signed)
MD informed of critical INR level 4.4,temp 97.4,and mews score 4. This am await further orders. Paged Dr.Regalado. Patient remains responsive pleasantly confused this am no appearant distress noted.

## 2019-03-19 NOTE — Plan of Care (Signed)
  Problem: Clinical Measurements: ?Goal: Cardiovascular complication will be avoided ?Outcome: Progressing ?  ?Problem: Elimination: ?Goal: Will not experience complications related to urinary retention ?Outcome: Progressing ?  ?Problem: Pain Managment: ?Goal: General experience of comfort will improve ?Outcome: Progressing ?  ?Problem: Safety: ?Goal: Ability to remain free from injury will improve ?Outcome: Progressing ?  ?

## 2019-03-19 NOTE — Progress Notes (Signed)
MD requesting bed in higher [facility] unit for patient due to sepsis and need for possible antiarrthymic drips etc. Bed request put in as MD ordered await response.

## 2019-03-19 NOTE — Progress Notes (Signed)
PHARMACY - PHYSICIAN COMMUNICATION CRITICAL VALUE ALERT - BLOOD CULTURE IDENTIFICATION (BCID)  Russell Dillon is an 73 y.o. male who presented to [facility] on 03/18/2019 with a chief complaint of AMS and hematuria. On admission patient febrile (Tmax 102.1) and WBC 22.5. This AM WBC remains elevated at 22, but patient afebrile.   Assessment:  Blood cultures from 5/7 growing GNR, BCID detected E. Coli (KPC not detected) in 1 out of 4 bottles  Name of physician (or Provider) Contacted: Niel Hummer, MD  Current antibiotics:  Ceftriaxone 1g IV q24h  Changes to prescribed antibiotics recommended:  Increase Ceftriaxone to 2g IV q24h  Results for orders placed or performed during the hospital encounter of 03/18/19  Blood Culture ID Panel (Reflexed) (Collected: 03/18/2019  7:05 PM)  Result Value Ref Range   Enterococcus species NOT DETECTED NOT DETECTED   Listeria monocytogenes NOT DETECTED NOT DETECTED   Staphylococcus species NOT DETECTED NOT DETECTED   Staphylococcus aureus (BCID) NOT DETECTED NOT DETECTED   Streptococcus species NOT DETECTED NOT DETECTED   Streptococcus agalactiae NOT DETECTED NOT DETECTED   Streptococcus pneumoniae NOT DETECTED NOT DETECTED   Streptococcus pyogenes NOT DETECTED NOT DETECTED   Acinetobacter baumannii NOT DETECTED NOT DETECTED   Enterobacteriaceae species DETECTED (A) NOT DETECTED   Enterobacter cloacae complex NOT DETECTED NOT DETECTED   Escherichia coli DETECTED (A) NOT DETECTED   Klebsiella oxytoca NOT DETECTED NOT DETECTED   Klebsiella pneumoniae NOT DETECTED NOT DETECTED   Proteus species NOT DETECTED NOT DETECTED   Serratia marcescens NOT DETECTED NOT DETECTED   Carbapenem resistance NOT DETECTED NOT DETECTED   Haemophilus influenzae NOT DETECTED NOT DETECTED   Neisseria meningitidis NOT DETECTED NOT DETECTED   Pseudomonas aeruginosa NOT DETECTED NOT DETECTED   Candida albicans NOT DETECTED NOT DETECTED   Candida glabrata NOT DETECTED NOT  DETECTED   Candida krusei NOT DETECTED NOT DETECTED   Candida parapsilosis NOT DETECTED NOT DETECTED   Candida tropicalis NOT DETECTED NOT DETECTED   Thank you for allowing pharmacy to be a part of this patient's care.  Leron Croak, PharmD PGY1 Pharmacy Resident Phone: [phone]  Please check AMION for all [place] phone numbers 03/19/2019  10:36 AM

## 2019-03-20 LAB — CBC
HCT: 31.1 % — ABNORMAL LOW (ref 39.0–52.0)
Hemoglobin: 9.7 g/dL — ABNORMAL LOW (ref 13.0–17.0)
MCH: 26.9 pg (ref 26.0–34.0)
MCHC: 31.2 g/dL (ref 30.0–36.0)
MCV: 86.4 fL (ref 80.0–100.0)
Platelets: 350 10*3/uL (ref 150–400)
RBC: 3.6 MIL/uL — ABNORMAL LOW (ref 4.22–5.81)
RDW: 18.4 % — ABNORMAL HIGH (ref 11.5–15.5)
WBC: 13.6 10*3/uL — ABNORMAL HIGH (ref 4.0–10.5)
nRBC: 0 % (ref 0.0–0.2)

## 2019-03-20 LAB — BASIC METABOLIC PANEL
Anion gap: 10 (ref 5–15)
BUN: 17 mg/dL (ref 8–23)
CO2: 17 mmol/L — ABNORMAL LOW (ref 22–32)
Calcium: 8.2 mg/dL — ABNORMAL LOW (ref 8.9–10.3)
Chloride: 111 mmol/L (ref 98–111)
Creatinine, Ser: 0.86 mg/dL (ref 0.61–1.24)
GFR calc Af Amer: >60 mL/min (ref >60)
GFR calc non Af Amer: >60 mL/min (ref >60)
Glucose, Bld: 113 mg/dL — ABNORMAL HIGH (ref 70–99)
Potassium: 3.3 mmol/L — ABNORMAL LOW (ref 3.5–5.1)
Sodium: 138 mmol/L (ref 135–145)

## 2019-03-20 LAB — URINE CULTURE: Culture: 50000 — AB

## 2019-03-20 LAB — PROTIME-INR
INR: 3.1 — ABNORMAL HIGH (ref 0.8–1.2)
Prothrombin Time: 31.2 seconds — ABNORMAL HIGH (ref 11.4–15.2)

## 2019-03-20 LAB — MAGNESIUM: Magnesium: 1.7 mg/dL (ref 1.7–2.4)

## 2019-03-20 MED ORDER — SODIUM CHLORIDE 0.9 % IV BOLUS
250.0000 mL | Freq: Once | INTRAVENOUS | Status: AC
  Administered 2019-03-20: 15:00:00 250 mL via INTRAVENOUS

## 2019-03-20 MED ORDER — POTASSIUM CHLORIDE CRYS ER 20 MEQ PO TBCR
40.0000 meq | EXTENDED_RELEASE_TABLET | Freq: Once | ORAL | Status: AC
  Administered 2019-03-20: 40 meq via ORAL
  Filled 2019-03-20: qty 2

## 2019-03-20 MED ORDER — MAGNESIUM SULFATE 2 GM/50ML IV SOLN
2.0000 g | Freq: Once | INTRAVENOUS | Status: AC
  Administered 2019-03-20: 11:00:00 2 g via INTRAVENOUS
  Filled 2019-03-20: qty 50

## 2019-03-20 MED ORDER — SODIUM CHLORIDE 0.9 % IV BOLUS
250.0000 mL | Freq: Once | INTRAVENOUS | Status: AC
  Administered 2019-03-20: 09:00:00 250 mL via INTRAVENOUS

## 2019-03-20 MED ORDER — WARFARIN SODIUM 2 MG PO TABS
2.0000 mg | ORAL_TABLET | Freq: Once | ORAL | Status: AC
  Administered 2019-03-20: 2 mg via ORAL
  Filled 2019-03-20: qty 1

## 2019-03-20 MED ORDER — POTASSIUM CHLORIDE CRYS ER 20 MEQ PO TBCR
40.0000 meq | EXTENDED_RELEASE_TABLET | Freq: Once | ORAL | Status: AC
  Administered 2019-03-20: 09:00:00 40 meq via ORAL
  Filled 2019-03-20: qty 2

## 2019-03-20 NOTE — Progress Notes (Signed)
ANTICOAGULATION CONSULT NOTE - Follow Up Consult  Pharmacy Consult for Coumadin Indication: atrial fibrillation  No Known Allergies  Patient Measurements: Height: 5\' 10"  (177.8 cm) Weight: 186 lb 8.2 oz (84.6 kg) IBW/kg (Calculated) : 73  Vital Signs: Temp: 98.9 F (37.2 C) (05/09 0010)  Labs: Recent Labs    03/18/19 1819 03/19/19 0241 03/19/19 0732 03/20/19 0328  HGB 12.5* 11.2*  --  9.7*  HCT 39.8 35.1*  --  31.1*  PLT 409* 374  --  350  LABPROT 38.3*  --  41.0* 31.2*  INR 4.0*  --  4.4* 3.1*  CREATININE 1.06 1.06  --  0.86    Estimated Creatinine Clearance: 77.8 mL/min (by C-G formula based on SCr of 0.86 mg/dL).   Medications:  Coumadin PTA regimen: 5mg  MWFSa ; 2.5mg  TTSu  Assessment: 73 year old male admitted 5/7 with pyelonephritis and E coli bacteremia. He is on chronic anticoagulation with Coumadin for atrial fibrillation. His INR is supratherapeutic, likely due to acute illness and recent Levaquin therapy.  5/9 Update: INR yesterday 4.4, held dose, now dropped to 3.1. No bleeding noted per RN, did note pt has had a large BM that was relatively dark. Hgb down trending from 12.5 >>9.7, likely dilutional. Will continue to trend this. Will give low dose coumadin today to prevent subtherapeutic INR tomorrow.   Goal of Therapy:  INR 2-3 Monitor platelets by anticoagulation protocol: Yes   Plan:  Warfarin 2mg  x1 tonight Daily PT/INR  Monitor for bleeding  Thank you for involving pharmacy in this patient's care.  Janae Bridgeman, PharmD PGY1 Pharmacy Resident Phone: [phone] 03/20/2019 8:33 AM

## 2019-03-20 NOTE — Progress Notes (Signed)
PROGRESS NOTE    Russell Dillon  XTK:[id number] DOB: 06/20/1946 DOA: 03/18/2019 PCP: Marton Redwood, MD    Brief Narrative: 73 year old with past medical history significant for chronic A. fib on Coumadin, hyperlipidemia, hypertension recent patellar tendon rupture status post surgery 3 weeks ago, dementia and bipolar who came to the ED with confusion, hematuria and fever.  Patient was recently treated orally for UTI by his primary care doctor with Levaquin.   She is admitted with sepsis from urinary tract infection.  Due to recent knee surgery orthopedic was consulted.  They evaluated the patient and they do not think that patient's knee is infected. Patient 1 out of 2 blood cultures is growing E. coli.  Assessment & Plan:   Principal Problem:   Sepsis, Gram negative ([place]) Active Problems:   GERD   Atrial fibrillation, chronic   Long term (current) use of anticoagulants   HTN (hypertension)   Hyperlipidemia   Dementia without behavioral disturbance (HCC)   Leukocytosis   Atrial fibrillation (HCC)  1-sepsis secondary to urinary tract infection: e coli bacteremia  Continue with IV fluids, IV ceftriaxone.  Cultures positive for E. coli.  Follow sensitivity. Sensitive to ceftriaxone.  Renal ultrasound performed and it was negative for hydronephrosis or pyelonephritis.  Showed diverticuli enlargement of prostate gland.  Review ultrasound with urology who recommend for patient to follow-up as an outpatient. WBC trending down. SBP soft,   2-A. fib with RVR: Will resume Cardizem lower dose, will and hold her parameters for blood pressure. Coumadin per pharmacy to dose.    3-Dementia: We will hold lithium avoid hypotension.  4-Acute metabolic encephalopathy: Likely related to acute infection and sepsis. Improving.   5-hyperlipidemia; per wife patient was taking off of his statin.  6-hypokalemia replete orally. Replete mg.  7-recent Left knee Sx, tendon rupture repair.  orthopedic informed of admission.   8-Metabolic acidosis; IV fluids, IV bolus.   9-A fib; Remune Cardizem low dose, but holding some doses due to soft BP. HR below 115.   10-Anemia; hb today at 9. On admission at 53. pior hb at 9. Suspect hb on admission due to hemoconcentration.    Estimated body mass index is 26.76 kg/m as calculated from the following:   Height as of this encounter: 5\' 10"  (1.778 m).   Weight as of this encounter: 84.6 kg.   DVT prophylaxis: Coumadin, supratherapeutic INR Code Status: Full code Family Communication: Wife updated over the phone Disposition Plan: remain for treatment of sepsis, SBP soft,    Consultants:   Ortho   Procedures:  Renal ultrasound; Areas of bladder wall thickening, potentially related to trabeculation. 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  2. No hydronephrosis.   Antimicrobials:   Ceftriaxone   Subjective: He is more alert, appears less tired.  No eating a lot.    Objective: Vitals:   03/20/19 0834 03/20/19 0856 03/20/19 0935 03/20/19 1237  BP: 97/72 97/65 93/62  97/65  Pulse: 96 (!) 103 100 (!) 116  Resp: 19  15 (!) 22  Temp:      TempSrc:      SpO2: 100%  99% 96%  Weight:      Height:        Intake/Output Summary (Last 24 hours) at 03/20/2019 1503 Last data filed at 03/20/2019 0404 Gross per 24 hour  Intake 100 ml  Output 1020 ml  Net -920 ml   [employer]  03/18/19 2337 03/19/19 0418  Weight: 82.5 kg 84.6 kg    Examination:  General exam:NAD Respiratory system: CTA Cardiovascular system: S 1, S 2 RRR. Gastrointestinal system: BS present, soft, nt Central nervous system:alert, follow command Extremities: left knee tender to palpation, incision healing, no redness.  Skin: No rashes, lesions or ulcers    Data Reviewed: I have personally reviewed following labs and imaging studies  CBC:  Recent Labs  Lab 03/18/19 1819 03/19/19 0241 03/20/19 0328  WBC 22.5* 22.0* 13.6*  NEUTROABS 20.5*  --   --   HGB 12.5* 11.2* 9.7*  HCT 39.8 35.1* 31.1*  MCV 87.3 85.6 86.4  PLT 409* 374 196   Basic Metabolic Panel: Recent Labs  Lab 03/18/19 1819 03/19/19 0241 03/20/19 0328  NA 137 140 138  K 3.5 3.4* 3.3*  CL 105 107 111  CO2 20* 22 17*  GLUCOSE 134* 126* 113*  BUN 17 15 17   CREATININE 1.06 1.06 0.86  CALCIUM 8.8* 8.3* 8.2*  MG  --   --  1.7   GFR: Estimated Creatinine Clearance: 77.8 mL/min (by C-G formula based on SCr of 0.86 mg/dL). Liver Function Tests: Recent Labs  Lab 03/18/19 1819 03/19/19 0241  AST 17 15  ALT 40 29  ALKPHOS 113 102  BILITOT 1.4* 0.9  PROT 7.1 6.3*  ALBUMIN 2.6* 2.2*   No results for input(s): LIPASE, AMYLASE in the last 168 hours. No results for input(s): AMMONIA in the last 168 hours. Coagulation Profile: Recent Labs  Lab 03/18/19 1819 03/19/19 0732 03/20/19 0328  INR 4.0* 4.4* 3.1*   Cardiac Enzymes: No results for input(s): CKTOTAL, CKMB, CKMBINDEX, TROPONINI in the last 168 hours. BNP (last 3 results) No results for input(s): PROBNP in the last 8760 hours. HbA1C: No results for input(s): HGBA1C in the last 72 hours. CBG: No results for input(s): GLUCAP in the last 168 hours. Lipid Profile: No results for input(s): CHOL, HDL, LDLCALC, TRIG, CHOLHDL, LDLDIRECT in the last 72 hours. Thyroid Function Tests: Recent Labs    03/18/19 2339  TSH 2.128   Anemia Panel: No results for input(s): VITAMINB12, FOLATE, FERRITIN, TIBC, IRON, RETICCTPCT in the last 72 hours. Sepsis Labs: Recent Labs  Lab 03/18/19 1835 03/18/19 2339  LATICACIDVEN 1.4 1.1    Recent Results (from the past 240 hour(s))  Blood culture (routine x 2)     Status: None (Preliminary result)   Collection Time: 03/18/19  6:56 PM  Result Value Ref Range Status   Specimen Description BLOOD RIGHT ANTECUBITAL  Final   Special Requests   Final    BOTTLES  DRAWN AEROBIC AND ANAEROBIC Blood Culture adequate volume   Culture   Final    NO GROWTH < 24 HOURS Performed at [facility], [place] [address], [place], [place] [address]    Report Status PENDING  Incomplete  Blood culture (routine x 2)     Status: Abnormal (Preliminary result)   Collection Time: 03/18/19  7:05 PM  Result Value Ref Range Status   Specimen Description BLOOD BLOOD RIGHT FOREARM  Final   Special Requests   Final    BOTTLES DRAWN AEROBIC AND ANAEROBIC Blood Culture results may not be optimal due to an inadequate volume of blood received in culture bottles   Culture  Setup Time   Final    GRAM NEGATIVE RODS ANAEROBIC BOTTLE ONLY CRITICAL RESULT CALLED TO, READ BACK BY AND VERIFIED WITH: PHARMD R FANNING [id number] AT 1034 AM BY CM Performed at  [facility], [place] [address], [place], [place] [address]    Culture ESCHERICHIA COLI (A)  Final   Report Status PENDING  Incomplete  Blood Culture ID Panel (Reflexed)     Status: Abnormal   Collection Time: 03/18/19  7:05 PM  Result Value Ref Range Status   Enterococcus species NOT DETECTED NOT DETECTED Final   Listeria monocytogenes NOT DETECTED NOT DETECTED Final   Staphylococcus species NOT DETECTED NOT DETECTED Final   Staphylococcus aureus (BCID) NOT DETECTED NOT DETECTED Final   Streptococcus species NOT DETECTED NOT DETECTED Final   Streptococcus agalactiae NOT DETECTED NOT DETECTED Final   Streptococcus pneumoniae NOT DETECTED NOT DETECTED Final   Streptococcus pyogenes NOT DETECTED NOT DETECTED Final   Acinetobacter baumannii NOT DETECTED NOT DETECTED Final   Enterobacteriaceae species DETECTED (A) NOT DETECTED Final    Comment: Enterobacteriaceae represent a large family of gram-negative bacteria, not a single organism. CRITICAL RESULT CALLED TO, READ BACK BY AND VERIFIED WITH: PHARMD R FANNING [id number] AT 1034 AM BY CM    Enterobacter cloacae complex NOT DETECTED NOT DETECTED Final   Escherichia coli DETECTED  (A) NOT DETECTED Final    Comment: CRITICAL RESULT CALLED TO, READ BACK BY AND VERIFIED WITH: PHARMD R FANNING [id number] AT 1034 BY CM    Klebsiella oxytoca NOT DETECTED NOT DETECTED Final   Klebsiella pneumoniae NOT DETECTED NOT DETECTED Final   Proteus species NOT DETECTED NOT DETECTED Final   Serratia marcescens NOT DETECTED NOT DETECTED Final   Carbapenem resistance NOT DETECTED NOT DETECTED Final   Haemophilus influenzae NOT DETECTED NOT DETECTED Final   Neisseria meningitidis NOT DETECTED NOT DETECTED Final   Pseudomonas aeruginosa NOT DETECTED NOT DETECTED Final   Candida albicans NOT DETECTED NOT DETECTED Final   Candida glabrata NOT DETECTED NOT DETECTED Final   Candida krusei NOT DETECTED NOT DETECTED Final   Candida parapsilosis NOT DETECTED NOT DETECTED Final   Candida tropicalis NOT DETECTED NOT DETECTED Final    Comment: Performed at [facility], [place] [address], [place], [place] [address]  SARS Coronavirus 2 (CEPHEID - Performed in [place] [facility] lab), Hosp Order     Status: None   Collection Time: 03/18/19  7:28 PM  Result Value Ref Range Status   SARS Coronavirus 2 NEGATIVE NEGATIVE Final    Comment: (NOTE) If result is NEGATIVE SARS-CoV-2 target nucleic acids are NOT DETECTED. The SARS-CoV-2 RNA is generally detectable in upper and lower  respiratory specimens during the acute phase of infection. The lowest  concentration of SARS-CoV-2 viral copies this assay can detect is 250  copies / mL. A negative result does not preclude SARS-CoV-2 infection  and should not be used as the sole basis for treatment or other  patient management decisions.  A negative result may occur with  improper specimen collection / handling, submission of specimen other  than nasopharyngeal swab, presence of viral mutation(s) within the  areas targeted by this assay, and inadequate number of viral copies  (<250 copies / mL). A negative result must be combined with clinical   observations, patient history, and epidemiological information. If result is POSITIVE SARS-CoV-2 target nucleic acids are DETECTED. The SARS-CoV-2 RNA is generally detectable in upper and lower  respiratory specimens dur ing the acute phase of infection.  Positive  results are indicative of active infection with SARS-CoV-2.  Clinical  correlation with patient history and other diagnostic information is  necessary to determine patient infection status.  Positive  results do  not rule out bacterial infection or co-infection with other viruses. If result is PRESUMPTIVE POSTIVE SARS-CoV-2 nucleic acids MAY BE PRESENT.   A presumptive positive result was obtained on the submitted specimen  and confirmed on repeat testing.  While 2019 novel coronavirus  (SARS-CoV-2) nucleic acids may be present in the submitted sample  additional confirmatory testing may be necessary for epidemiological  and / or clinical management purposes  to differentiate between  SARS-CoV-2 and other Sarbecovirus currently known to infect humans.  If clinically indicated additional testing with an alternate test  methodology [phone]) is advised. The SARS-CoV-2 RNA is generally  detectable in upper and lower respiratory sp ecimens during the acute  phase of infection. The expected result is Negative. Fact Sheet for Patients:  StrictlyIdeas.no Fact Sheet for Healthcare Providers: BankingDealers.co.za This test is not yet approved or cleared by the [place] FDA and has been authorized for detection and/or diagnosis of SARS-CoV-2 by FDA under an Emergency Use Authorization (EUA).  This EUA will remain in effect (meaning this test can be used) for the duration of the COVID-19 declaration under Section 564(b)(1) of the Act, 21 U.S.C. section 360bbb-3(b)(1), unless the authorization is terminated or revoked sooner. Performed at [facility], [place] [address]  [place], [place] [address]   Urine culture     Status: Abnormal   Collection Time: 03/18/19  9:16 PM  Result Value Ref Range Status   Specimen Description URINE, CLEAN CATCH  Final   Special Requests   Final    NONE Performed at [facility], [place] [address] [place], [place] [address]    Culture 50,000 COLONIES/mL ESCHERICHIA COLI (A)  Final   Report Status 03/20/2019 FINAL  Final   Organism ID, Bacteria ESCHERICHIA COLI (A)  Final      Susceptibility   Escherichia coli - MIC*    AMPICILLIN >=32 RESISTANT Resistant     CEFAZOLIN <=4 SENSITIVE Sensitive     CEFTRIAXONE <=1 SENSITIVE Sensitive     CIPROFLOXACIN >=4 RESISTANT Resistant     GENTAMICIN <=1 SENSITIVE Sensitive     IMIPENEM <=0.25 SENSITIVE Sensitive     NITROFURANTOIN <=16 SENSITIVE Sensitive     TRIMETH/SULFA <=20 SENSITIVE Sensitive     AMPICILLIN/SULBACTAM >=32 RESISTANT Resistant     PIP/TAZO <=4 SENSITIVE Sensitive     Extended ESBL NEGATIVE Sensitive     * 50,000 COLONIES/mL ESCHERICHIA COLI  MRSA PCR Screening     Status: None   Collection Time: 03/19/19  1:19 PM  Result Value Ref Range Status   MRSA by PCR NEGATIVE NEGATIVE Final    Comment:        The GeneXpert MRSA Assay (FDA approved for NASAL specimens only), is one component of a comprehensive MRSA colonization surveillance program. It is not intended to diagnose MRSA infection nor to guide or monitor treatment for MRSA infections. Performed at [facility], [place] [address], [place], [place] [address]          Radiology Studies: Ct Head Wo Contrast  Result Date: 03/18/2019 CLINICAL DATA:  Altered level of consciousness, confusion. EXAM: CT HEAD WITHOUT CONTRAST TECHNIQUE: Contiguous axial images were obtained from the base of the skull through the vertex without intravenous contrast. COMPARISON:  02/08/2019 FINDINGS: Brain: There is atrophy and chronic small vessel disease changes. No acute intracranial abnormality. Specifically, no  hemorrhage, hydrocephalus, mass lesion, acute infarction, or significant intracranial injury. Vascular: No hyperdense vessel or unexpected calcification. Skull:  No acute calvarial abnormality. Sinuses/Orbits: Visualized paranasal sinuses and mastoids clear. Orbital soft tissues unremarkable. Other: None IMPRESSION: Atrophy, chronic microvascular disease. No acute intracranial abnormality. Electronically Signed   By: Rolm Baptise M.D.   On: 03/18/2019 22:22   US Renal  Result Date: 03/19/2019 CLINICAL DATA:  Urinary tract infection EXAM: RENAL / URINARY TRACT ULTRASOUND COMPLETE COMPARISON:  CT scan 02/08/2019 FINDINGS: Right Kidney: Renal measurements: 11.0 x 6.2 x 5.6 cm = volume: 201 ML. Cortical thinning without hydronephrosis. Parenchymal echotexture appears increased. Left Kidney: Renal measurements: 11.2 x 7.4 x 7.3 cm = volume: 318 mL. Poorly visualized but echotexture may be increased. No hydronephrosis evident. Bladder: Appears normal for degree of bladder distention. Areas of apparent focal bladder wall thickening may be related to trabeculation. Note: Prostate gland is markedly enlarged. IMPRESSION: 1. Areas of bladder wall thickening, potentially related to trabeculation. 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 2. No hydronephrosis. Electronically Signed   By: Misty Stanley M.D.   On: 03/19/2019 11:24   Dg Chest Port 1 View  Result Date: 03/18/2019 CLINICAL DATA:  Altered mental status. EXAM: PORTABLE CHEST 1 VIEW COMPARISON:  Radiograph of February 08, 2019. FINDINGS: The heart size and mediastinal contours are within normal limits. Both lungs are clear. The visualized skeletal structures are unremarkable. IMPRESSION: No active disease. Electronically Signed   By: Marijo Conception M.D.   On: 03/18/2019 18:54        Scheduled Meds: . diltiazem  30 mg Oral Q6H  . donepezil  10 mg Oral Daily  .  feeding supplement (ENSURE ENLIVE)  237 mL Oral BID BM  . warfarin  2 mg Oral ONCE-1800  . Warfarin - Pharmacist Dosing Inpatient   Does not apply q1800   Continuous Infusions: . sodium chloride 100 mL/hr at 03/20/19 0703  . cefTRIAXone (ROCEPHIN)  IV 2 g (03/19/19 1807)     LOS: 2 days    Time spent: 35 minutes    Elmarie Shiley, MD [facility] Pager [phone] If 7PM-7AM, please contact night-coverage [email or web address] Password [email or web address] 03/20/2019, 3:03 PM

## 2019-03-20 NOTE — Progress Notes (Addendum)
Orthopaedic Trauma Service (OTS)      Subjective: Patient reports pain as unchanged with respect to the left knee.    Objective: Current Vitals Blood pressure 97/65, pulse (!) 103, temperature 98.9 F (37.2 C), resp. rate 19, height 5\' 10"  (1.778 m), weight 84.6 kg, SpO2 100 %. Vital signs in last 24 hours: Temp:  [97.6 F (36.4 C)-98.9 F (37.2 C)] 98.9 F (37.2 C) (05/09 0010) Pulse Rate:  [96-107] 103 (05/09 0856) Resp:  [17-19] 19 (05/09 0834) BP: (85-109)/(56-72) 97/65 (05/09 0856) SpO2:  [92 %-100 %] 100 % (05/09 0834)  Intake/Output from previous day: 05/08 0701 - 05/09 0700 In: 820 [P.O.:820] Out: 1021 [Urine:1020; Stool:1]  LABS Recent Labs    03/18/19 1819 03/19/19 0241 03/20/19 0328  HGB 12.5* 11.2* 9.7*   Recent Labs    03/19/19 0241 03/20/19 0328  WBC 22.0* 13.6*  RBC 4.10* 3.60*  HCT 35.1* 31.1*  PLT 374 350   Recent Labs    03/19/19 0241 03/20/19 0328  NA 140 138  K 3.4* 3.3*  CL 107 111  CO2 22 17*  BUN 15 17  CREATININE 1.06 0.86  GLUCOSE 126* 113*  CALCIUM 8.3* 8.2*   Recent Labs    03/19/19 0732 03/20/19 0328  INR 4.4* 3.1*     Physical Exam LLE Exquisite tenderness of 1 cm area with suture knot and bone tunnel are at anterior proximal tibia  Zero erythema  No drainage  Swelling minimal  Edema/ swelling controlled  Sens: DPN, SPN, TN intact  Motor: EHL, FHL, and lessor toe ext and flex all intact grossly  Brisk cap refill, warm to touch  Assessment/Plan:   No change; I offered needle aspiration just to prove mechanical rather than infectious irritation but patient refused. 1. PT/OT  2. DVT proph Coumadin and Lovenox 3. F/u 2-4 weeks (already scheduled) 4. I spoke with wife and provided update  Altamese Shepherd, MD [facility] [phone] (p)

## 2019-03-21 LAB — BASIC METABOLIC PANEL
Anion gap: 10 (ref 5–15)
BUN: 16 mg/dL (ref 8–23)
CO2: 20 mmol/L — ABNORMAL LOW (ref 22–32)
Calcium: 8 mg/dL — ABNORMAL LOW (ref 8.9–10.3)
Chloride: 110 mmol/L (ref 98–111)
Creatinine, Ser: 0.73 mg/dL (ref 0.61–1.24)
GFR calc Af Amer: >60 mL/min (ref >60)
GFR calc non Af Amer: >60 mL/min (ref >60)
Glucose, Bld: 107 mg/dL — ABNORMAL HIGH (ref 70–99)
Potassium: 3.5 mmol/L (ref 3.5–5.1)
Sodium: 140 mmol/L (ref 135–145)

## 2019-03-21 LAB — CBC
HCT: 30.2 % — ABNORMAL LOW (ref 39.0–52.0)
Hemoglobin: 9.7 g/dL — ABNORMAL LOW (ref 13.0–17.0)
MCH: 27.2 pg (ref 26.0–34.0)
MCHC: 32.1 g/dL (ref 30.0–36.0)
MCV: 84.6 fL (ref 80.0–100.0)
Platelets: 349 10*3/uL (ref 150–400)
RBC: 3.57 MIL/uL — ABNORMAL LOW (ref 4.22–5.81)
RDW: 18.2 % — ABNORMAL HIGH (ref 11.5–15.5)
WBC: 9.6 10*3/uL (ref 4.0–10.5)
nRBC: 0 % (ref 0.0–0.2)

## 2019-03-21 LAB — CULTURE, BLOOD (ROUTINE X 2)

## 2019-03-21 LAB — PROTIME-INR
INR: 2.3 — ABNORMAL HIGH (ref 0.8–1.2)
Prothrombin Time: 25.1 seconds — ABNORMAL HIGH (ref 11.4–15.2)

## 2019-03-21 MED ORDER — WARFARIN SODIUM 5 MG PO TABS
5.0000 mg | ORAL_TABLET | Freq: Once | ORAL | Status: AC
  Administered 2019-03-21: 5 mg via ORAL
  Filled 2019-03-21: qty 1

## 2019-03-21 MED ORDER — POTASSIUM CHLORIDE CRYS ER 20 MEQ PO TBCR
40.0000 meq | EXTENDED_RELEASE_TABLET | Freq: Once | ORAL | Status: AC
  Administered 2019-03-21: 08:00:00 40 meq via ORAL
  Filled 2019-03-21: qty 2

## 2019-03-21 NOTE — Plan of Care (Signed)
  Problem: Activity: Goal: Capacity to carry out activities will improve Outcome: Not Progressing  Refusing turns. Patient educated on importance of turning.   Problem: Cardiac: Goal: Ability to achieve and maintain adequate cardiopulmonary perfusion will improve Outcome: Not Progressing  Patient remains in Afib. HR and BP monitored.

## 2019-03-21 NOTE — Progress Notes (Signed)
ANTICOAGULATION CONSULT NOTE - Follow Up Consult  Pharmacy Consult for Coumadin Indication: atrial fibrillation  No Known Allergies  Patient Measurements: Height: 5\' 10"  (177.8 cm) Weight: 186 lb 8.2 oz (84.6 kg) IBW/kg (Calculated) : 73  Vital Signs: Temp: 98 F (36.7 C) (05/10 0500) Temp Source: Oral (05/10 0500) BP: 119/67 (05/10 0353) Pulse Rate: 96 (05/10 0353)  Labs: Recent Labs    03/19/19 0241 03/19/19 0732 03/20/19 0328 03/21/19 0353  HGB 11.2*  --  9.7* 9.7*  HCT 35.1*  --  31.1* 30.2*  PLT 374  --  350 349  LABPROT  --  41.0* 31.2* 25.1*  INR  --  4.4* 3.1* 2.3*  CREATININE 1.06  --  0.86 0.73    Estimated Creatinine Clearance: 83.6 mL/min (by C-G formula based on SCr of 0.73 mg/dL).   Medications:  Coumadin PTA regimen: 5mg  MWFSa ; 2.5mg  TTSu  Assessment: 73 year old male admitted 5/7 with pyelonephritis and E coli bacteremia. He is on chronic anticoagulation with Coumadin for atrial fibrillation. His INR is supratherapeutic, likely due to acute illness and recent Levaquin therapy.  5/10: INR yesterday 3.1 and gave low dose 2mg  to prevent large INR drop (goal 2-3). INR today therapeutic at 2.3 and CBC now stable this morning.    Goal of Therapy:  INR 2-3 Monitor platelets by anticoagulation protocol: Yes   Plan:  Warfarin 5mg  x1 tonight Daily PT/INR  Monitor for bleeding  Thank you for involving pharmacy in this patient's care.  Janae Bridgeman, PharmD PGY1 Pharmacy Resident Phone: [phone] 03/21/2019 7:43 AM

## 2019-03-21 NOTE — Progress Notes (Signed)
PROGRESS NOTE    Russell Dillon  RSW:[id number] DOB: December 21, 1945 DOA: 03/18/2019 PCP: Marton Redwood, MD    Brief Narrative: 73 year old with past medical history significant for chronic A. fib on Coumadin, hyperlipidemia, hypertension recent patellar tendon rupture status post surgery 3 weeks ago, dementia and bipolar who came to the ED with confusion, hematuria and fever.  Patient was recently treated orally for UTI by his primary care doctor with Levaquin.   She is admitted with sepsis from urinary tract infection.  Due to recent knee surgery orthopedic was consulted.  They evaluated the patient and they do not think that patient's knee is infected. Patient 1 out of 2 blood cultures is growing E. coli.  Assessment & Plan:   Principal Problem:   Sepsis, Gram negative ([place]) Active Problems:   GERD   Atrial fibrillation, chronic   Long term (current) use of anticoagulants   HTN (hypertension)   Hyperlipidemia   Dementia without behavioral disturbance (HCC)   Leukocytosis   Atrial fibrillation (HCC)  1-Sepsis secondary to urinary tract infection: e coli bacteremia  Continue with IV fluids, IV ceftriaxone.  Cultures positive for E. coli.  Follow sensitivity. Sensitive to ceftriaxone.  Renal ultrasound performed and it was negative for hydronephrosis or pyelonephritis.  Showed diverticuli enlargement of prostate gland.  Review ultrasound with urology who recommend for patient to follow-up as an outpatient. WBC normalized.   2-A. fib with RVR:  Coumadin per pharmacy to dose.  Still titrating Cardizem, holding some doses due to soft BP  3-Dementia: We will hold lithium avoid hypotension.  4-Acute metabolic encephalopathy: Likely related to acute infection and sepsis. Improving.   5-hyperlipidemia; per wife patient was taking off of his statin.  6-hypokalemia replete orally. Replete mg.  7-recent Left knee Sx, tendon rupture repair. orthopedic informed of admission.    8-Metabolic acidosis; IV fluids, IV bolus.   9-A fib; Remune Cardizem low dose, but holding some doses due to soft BP. HR below 115.   10-Anemia; hb today at 9. On admission at 9. pior hb at 9. Suspect hb on admission due to hemoconcentration.  Hb has remain stable.   Estimated body mass index is 26.76 kg/m as calculated from the following:   Height as of this encounter: 5\' 10"  (1.778 m).   Weight as of this encounter: 84.6 kg.   DVT prophylaxis: Coumadin, supratherapeutic INR Code Status: Full code Family Communication: Wife updated over the phone Disposition Plan: remain for treatment of sepsis, SBP soft, needs to follow repeated blood culture to document clearance of bacteremia.    Consultants:   Ortho   Procedures:  Renal ultrasound; Areas of bladder wall thickening, potentially related to trabeculation. 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  2. No hydronephrosis.   Antimicrobials:   Ceftriaxone   Subjective: He is alert, poor oral intake.  Knee pain some what improved.    Objective: Vitals:   03/21/19 0353 03/21/19 0500 03/21/19 0754 03/21/19 1154  BP: 119/67  100/66 98/60  Pulse: 96  96 83  Resp: 18  16 14   Temp:  98 F (36.7 C) 98.4 F (36.9 C)   TempSrc:  Oral Oral   SpO2: 98%  99% 100%  Weight:      Height:        Intake/Output Summary (Last 24 hours) at 03/21/2019 1607 Last data filed at 03/21/2019 1317 Gross per 24 hour  Intake 2661.35 ml  Output 2900 ml  Net -238.65 ml   Filed Weights   03/18/19 2337 03/19/19 0418  Weight: 82.5 kg 84.6 kg    Examination:  General exam: NAD Respiratory system: CTA Cardiovascular system: S 1, S 2 RRR Gastrointestinal system: BS present, soft, nt Central nervous system: alert follow command Extremities: Left knee incision healing. Less tender to palpation Skin: No rashes, lesions or ulcers     Data Reviewed: I have personally reviewed following labs and imaging studies  CBC: Recent Labs  Lab 03/18/19 1819 03/19/19 0241 03/20/19 0328 03/21/19 0353  WBC 22.5* 22.0* 13.6* 9.6  NEUTROABS 20.5*  --   --   --   HGB 12.5* 11.2* 9.7* 9.7*  HCT 39.8 35.1* 31.1* 30.2*  MCV 87.3 85.6 86.4 84.6  PLT 409* 374 350 373   Basic Metabolic Panel: Recent Labs  Lab 03/18/19 1819 03/19/19 0241 03/20/19 0328 03/21/19 0353  NA 137 140 138 140  K 3.5 3.4* 3.3* 3.5  CL 105 107 111 110  CO2 20* 22 17* 20*  GLUCOSE 134* 126* 113* 107*  BUN 17 15 17 16   CREATININE 1.06 1.06 0.86 0.73  CALCIUM 8.8* 8.3* 8.2* 8.0*  MG  --   --  1.7  --    GFR: Estimated Creatinine Clearance: 83.6 mL/min (by C-G formula based on SCr of 0.73 mg/dL). Liver Function Tests: Recent Labs  Lab 03/18/19 1819 03/19/19 0241  AST 17 15  ALT 40 29  ALKPHOS 113 102  BILITOT 1.4* 0.9  PROT 7.1 6.3*  ALBUMIN 2.6* 2.2*   No results for input(s): LIPASE, AMYLASE in the last 168 hours. No results for input(s): AMMONIA in the last 168 hours. Coagulation Profile: Recent Labs  Lab 03/18/19 1819 03/19/19 0732 03/20/19 0328 03/21/19 0353  INR 4.0* 4.4* 3.1* 2.3*   Cardiac Enzymes: No results for input(s): CKTOTAL, CKMB, CKMBINDEX, TROPONINI in the last 168 hours. BNP (last 3 results) No results for input(s): PROBNP in the last 8760 hours. HbA1C: No results for input(s): HGBA1C in the last 72 hours. CBG: No results for input(s): GLUCAP in the last 168 hours. Lipid Profile: No results for input(s): CHOL, HDL, LDLCALC, TRIG, CHOLHDL, LDLDIRECT in the last 72 hours. Thyroid Function Tests: Recent Labs    03/18/19 2339  TSH 2.128   Anemia Panel: No results for input(s): VITAMINB12, FOLATE, FERRITIN, TIBC, IRON, RETICCTPCT in the last 72 hours. Sepsis Labs: Recent Labs  Lab 03/18/19 1835 03/18/19 2339  LATICACIDVEN 1.4 1.1    Recent Results (from the past 240 hour(s))  Blood culture (routine x 2)      Status: None (Preliminary result)   Collection Time: 03/18/19  6:56 PM  Result Value Ref Range Status   Specimen Description BLOOD RIGHT ANTECUBITAL  Final   Special Requests   Final    BOTTLES DRAWN AEROBIC AND ANAEROBIC Blood Culture adequate volume   Culture   Final    NO GROWTH 3 DAYS Performed at [facility], [place] [address] [place], [place] [address]    Report Status PENDING  Incomplete  Blood culture (routine x 2)     Status: Abnormal   Collection Time: 03/18/19  7:05 PM  Result Value Ref Range Status   Specimen Description BLOOD BLOOD RIGHT FOREARM  Final   Special Requests   Final    BOTTLES DRAWN AEROBIC AND ANAEROBIC Blood Culture results may not be optimal due to an inadequate volume of blood received in culture bottles   Culture  Setup Time   Final    GRAM NEGATIVE RODS ANAEROBIC BOTTLE ONLY CRITICAL RESULT CALLED TO, READ BACK BY AND VERIFIED WITH: PHARMD R FANNING [id number] AT 1034 AM BY CM Performed at [facility], [address], [place], [place] [address]    Culture ESCHERICHIA COLI (A)  Final   Report Status 03/21/2019 FINAL  Final   Organism ID, Bacteria ESCHERICHIA COLI  Final      Susceptibility   Escherichia coli - MIC*    AMPICILLIN >=32 RESISTANT Resistant     CEFAZOLIN <=4 SENSITIVE Sensitive     CEFEPIME <=1 SENSITIVE Sensitive     CEFTAZIDIME <=1 SENSITIVE Sensitive     CEFTRIAXONE <=1 SENSITIVE Sensitive     CIPROFLOXACIN >=4 RESISTANT Resistant     GENTAMICIN <=1 SENSITIVE Sensitive     IMIPENEM <=0.25 SENSITIVE Sensitive     TRIMETH/SULFA <=20 SENSITIVE Sensitive     AMPICILLIN/SULBACTAM >=32 RESISTANT Resistant     PIP/TAZO <=4 SENSITIVE Sensitive     Extended ESBL NEGATIVE Sensitive     * ESCHERICHIA COLI  Blood Culture ID Panel (Reflexed)     Status: Abnormal   Collection Time: 03/18/19  7:05 PM  Result Value Ref Range Status   Enterococcus species NOT DETECTED NOT DETECTED Final   Listeria monocytogenes NOT DETECTED NOT  DETECTED Final   Staphylococcus species NOT DETECTED NOT DETECTED Final   Staphylococcus aureus (BCID) NOT DETECTED NOT DETECTED Final   Streptococcus species NOT DETECTED NOT DETECTED Final   Streptococcus agalactiae NOT DETECTED NOT DETECTED Final   Streptococcus pneumoniae NOT DETECTED NOT DETECTED Final   Streptococcus pyogenes NOT DETECTED NOT DETECTED Final   Acinetobacter baumannii NOT DETECTED NOT DETECTED Final   Enterobacteriaceae species DETECTED (A) NOT DETECTED Final    Comment: Enterobacteriaceae represent a large family of gram-negative bacteria, not a single organism. CRITICAL RESULT CALLED TO, READ BACK BY AND VERIFIED WITH: PHARMD R FANNING [id number] AT 1034 AM BY CM    Enterobacter cloacae complex NOT DETECTED NOT DETECTED Final   Escherichia coli DETECTED (A) NOT DETECTED Final    Comment: CRITICAL RESULT CALLED TO, READ BACK BY AND VERIFIED WITH: PHARMD R FANNING [id number] AT 1034 BY CM    Klebsiella oxytoca NOT DETECTED NOT DETECTED Final   Klebsiella pneumoniae NOT DETECTED NOT DETECTED Final   Proteus species NOT DETECTED NOT DETECTED Final   Serratia marcescens NOT DETECTED NOT DETECTED Final   Carbapenem resistance NOT DETECTED NOT DETECTED Final   Haemophilus influenzae NOT DETECTED NOT DETECTED Final   Neisseria meningitidis NOT DETECTED NOT DETECTED Final   Pseudomonas aeruginosa NOT DETECTED NOT DETECTED Final   Candida albicans NOT DETECTED NOT DETECTED Final   Candida glabrata NOT DETECTED NOT DETECTED Final   Candida krusei NOT DETECTED NOT DETECTED Final   Candida parapsilosis NOT DETECTED NOT DETECTED Final   Candida tropicalis NOT DETECTED NOT DETECTED Final    Comment: Performed at [facility], [place] [address], [place],  [address]  SARS Coronavirus 2 (CEPHEID - Performed in [place] hospital lab), Hosp Order     Status: None   Collection Time: 03/18/19  7:28 PM  Result Value Ref Range Status   SARS Coronavirus 2 NEGATIVE NEGATIVE Final     Comment: (NOTE) If result is NEGATIVE SARS-CoV-2 target nucleic acids are NOT DETECTED. The SARS-CoV-2 RNA is generally detectable in upper and lower  respiratory specimens during the acute phase of infection. The lowest  concentration of SARS-CoV-2  viral copies this assay can detect is 250  copies / mL. A negative result does not preclude SARS-CoV-2 infection  and should not be used as the sole basis for treatment or other  patient management decisions.  A negative result may occur with  improper specimen collection / handling, submission of specimen other  than nasopharyngeal swab, presence of viral mutation(s) within the  areas targeted by this assay, and inadequate number of viral copies  (<250 copies / mL). A negative result must be combined with clinical  observations, patient history, and epidemiological information. If result is POSITIVE SARS-CoV-2 target nucleic acids are DETECTED. The SARS-CoV-2 RNA is generally detectable in upper and lower  respiratory specimens dur ing the acute phase of infection.  Positive  results are indicative of active infection with SARS-CoV-2.  Clinical  correlation with patient history and other diagnostic information is  necessary to determine patient infection status.  Positive results do  not rule out bacterial infection or co-infection with other viruses. If result is PRESUMPTIVE POSTIVE SARS-CoV-2 nucleic acids MAY BE PRESENT.   A presumptive positive result was obtained on the submitted specimen  and confirmed on repeat testing.  While 2019 novel coronavirus  (SARS-CoV-2) nucleic acids may be present in the submitted sample  additional confirmatory testing may be necessary for epidemiological  and / or clinical management purposes  to differentiate between  SARS-CoV-2 and other Sarbecovirus currently known to infect humans.  If clinically indicated additional testing with an alternate test  methodology [phone]) is advised. The  SARS-CoV-2 RNA is generally  detectable in upper and lower respiratory sp ecimens during the acute  phase of infection. The expected result is Negative. Fact Sheet for Patients:  StrictlyIdeas.no Fact Sheet for Healthcare Providers: BankingDealers.co.za This test is not yet approved or cleared by the [place] FDA and has been authorized for detection and/or diagnosis of SARS-CoV-2 by FDA under an Emergency Use Authorization (EUA).  This EUA will remain in effect (meaning this test can be used) for the duration of the COVID-19 declaration under Section 564(b)(1) of the Act, 21 U.S.C. section 360bbb-3(b)(1), unless the authorization is terminated or revoked sooner. Performed at [facility], [place] [address] [place], [place] [address]   Urine culture     Status: Abnormal   Collection Time: 03/18/19  9:16 PM  Result Value Ref Range Status   Specimen Description URINE, CLEAN CATCH  Final   Special Requests   Final    NONE Performed at [facility], [place] [address], [place], [place] [address]    Culture 50,000 COLONIES/mL ESCHERICHIA COLI (A)  Final   Report Status 03/20/2019 FINAL  Final   Organism ID, Bacteria ESCHERICHIA COLI (A)  Final      Susceptibility   Escherichia coli - MIC*    AMPICILLIN >=32 RESISTANT Resistant     CEFAZOLIN <=4 SENSITIVE Sensitive     CEFTRIAXONE <=1 SENSITIVE Sensitive     CIPROFLOXACIN >=4 RESISTANT Resistant     GENTAMICIN <=1 SENSITIVE Sensitive     IMIPENEM <=0.25 SENSITIVE Sensitive     NITROFURANTOIN <=16 SENSITIVE Sensitive     TRIMETH/SULFA <=20 SENSITIVE Sensitive     AMPICILLIN/SULBACTAM >=32 RESISTANT Resistant     PIP/TAZO <=4 SENSITIVE Sensitive     Extended ESBL NEGATIVE Sensitive     * 50,000 COLONIES/mL ESCHERICHIA COLI  MRSA PCR Screening     Status: None   Collection Time: 03/19/19  1:19 PM  Result Value Ref Range  Status   MRSA by PCR NEGATIVE NEGATIVE Final     Comment:        The GeneXpert MRSA Assay (FDA approved for NASAL specimens only), is one component of a comprehensive MRSA colonization surveillance program. It is not intended to diagnose MRSA infection nor to guide or monitor treatment for MRSA infections. Performed at [facility], [place] [address], [place], [place] [address]          Radiology Studies: No results found.      Scheduled Meds: . diltiazem  30 mg Oral Q6H  . donepezil  10 mg Oral Daily  . feeding supplement (ENSURE ENLIVE)  237 mL Oral BID BM  . warfarin  5 mg Oral ONCE-1800  . Warfarin - Pharmacist Dosing Inpatient   Does not apply q1800   Continuous Infusions: . sodium chloride 100 mL/hr at 03/21/19 1317  . cefTRIAXone (ROCEPHIN)  IV Stopped (03/20/19 1738)     LOS: 3 days    Time spent: 35 minutes    Elmarie Shiley, MD [facility] Pager [phone] If 7PM-7AM, please contact night-coverage [email or web address] Password [email or web address] 03/21/2019, 4:07 PM

## 2019-03-22 LAB — PROTIME-INR
INR: 2.6 — ABNORMAL HIGH (ref 0.8–1.2)
Prothrombin Time: 27.5 seconds — ABNORMAL HIGH (ref 11.4–15.2)

## 2019-03-22 MED ORDER — WARFARIN SODIUM 2.5 MG PO TABS
2.5000 mg | ORAL_TABLET | ORAL | Status: AC
  Administered 2019-03-22: 13:00:00 2.5 mg via ORAL
  Filled 2019-03-22: qty 1

## 2019-03-22 MED ORDER — POTASSIUM CHLORIDE CRYS ER 20 MEQ PO TBCR
40.0000 meq | EXTENDED_RELEASE_TABLET | Freq: Once | ORAL | Status: AC
  Administered 2019-03-22: 40 meq via ORAL
  Filled 2019-03-22: qty 2

## 2019-03-22 MED ORDER — COLCHICINE 0.6 MG PO TABS
0.6000 mg | ORAL_TABLET | Freq: Every day | ORAL | Status: DC
  Administered 2019-03-22 – 2019-03-23 (×2): 0.6 mg via ORAL
  Filled 2019-03-22 (×2): qty 1

## 2019-03-22 MED ORDER — DILTIAZEM HCL ER COATED BEADS 120 MG PO CP24: 120.0000 mg | ORAL_CAPSULE | Freq: Every day | ORAL | 11 refills | Status: DC

## 2019-03-22 MED ORDER — WARFARIN SODIUM 2.5 MG PO TABS: 2.5000 mg | ORAL_TABLET | Freq: Once | ORAL | Status: DC

## 2019-03-22 MED ORDER — SODIUM CHLORIDE 0.9 % IV SOLN
2.0000 g | INTRAVENOUS | Status: DC
  Administered 2019-03-22: 16:00:00 2 g via INTRAVENOUS
  Filled 2019-03-22 (×2): qty 20

## 2019-03-22 MED ORDER — ENSURE ENLIVE PO LIQD: 237.0000 mL | Freq: Two times a day (BID) | ORAL | 12 refills | Status: AC

## 2019-03-22 MED ORDER — ALLOPURINOL 100 MG PO TABS
100.0000 mg | ORAL_TABLET | Freq: Every day | ORAL | Status: DC
  Administered 2019-03-22 – 2019-03-25 (×4): 100 mg via ORAL
  Filled 2019-03-22 (×4): qty 1

## 2019-03-22 MED ORDER — LITHIUM CARBONATE 150 MG PO CAPS
150.0000 mg | ORAL_CAPSULE | Freq: Every day | ORAL | Status: DC
  Administered 2019-03-22 – 2019-03-24 (×3): 150 mg via ORAL
  Filled 2019-03-22 (×4): qty 1

## 2019-03-22 MED ORDER — CEPHALEXIN 500 MG PO CAPS: 500.0000 mg | ORAL_CAPSULE | Freq: Four times a day (QID) | ORAL | 0 refills | Status: DC

## 2019-03-22 MED ORDER — CYANOCOBALAMIN 1000 MCG/ML IJ SOLN
100.0000 ug | Freq: Once | INTRAMUSCULAR | Status: AC
  Administered 2019-03-22: 16:00:00 100 ug via INTRAMUSCULAR
  Filled 2019-03-22: qty 0.1

## 2019-03-22 NOTE — Evaluation (Signed)
Physical Therapy Evaluation Patient Details Name: Russell Dillon MRN: [id number] DOB: 1946/01/08 Today's Date: 03/22/2019   History of Present Illness  Pt is a 73 y.o. M with significant PMH of dementia, atrial fibrillation, bipolar disorder and L patellar tendon rupture with surgical repairs x 2 in April who was admitted 03/18/19 with sepsis with UTI and sepsis. Pt was discharged home from CIR on 4/20. Per wife, he has been bed bound and dependent since return home, home health therapy is involved.    Clinical Impression  Pt admitted with above diagnosis. Pt currently with functional limitations due to the deficits listed below (see PT Problem List).  Pt will benefit from skilled PT to increase their independence and safety with mobility to allow discharge to the venue listed below.   Pt unable to recall d/c home from CIR.  Pt easily frustrated with questions and encouraged to participate.  Pt assisted to sitting EOB however requiring significant assist.  Spoke with spouse on the phone and she reports pt has been mostly in bed since return home from rehab.  Pt was receiving HHPT and spouse reports pt has all DME needed.   Spouse reports caregiver fatigue and is looking into other resources for physically assisting pt.  Spouse reports plan is for pt to return home with HHPT (she states no SNF as pt would not thrive in SNF environment).     Follow Up Recommendations Home health PT;Supervision/Assistance - 24 hour    Equipment Recommendations  None recommended by PT    Recommendations for Other Services       Precautions / Restrictions Precautions Precautions: Fall Precaution Comments: per Dr. Carlean Jews most recent note, "PT WBAT with locked in full extension; otherwise leave unlocked with full PROM and no active extension against resistance including gravity; heel slides encouraged" Required Braces or Orthoses: Other Brace Other Brace: L hinged knee brace Restrictions Weight Bearing  Restrictions: Yes LLE Weight Bearing: Weight bearing as tolerated      Mobility  Bed Mobility Overal bed mobility: Needs Assistance Bed Mobility: Supine to Sit;Sit to Supine     Supine to sit: +2 for physical assistance;Max assist Sit to supine: +2 for physical assistance;Max assist   General bed mobility comments: cues for sequencing, assist for LEs and trunk; pt will initiate with cues however difficulty self assisting due right hand pain; also states "that's the most I can do"  Transfers                 General transfer comment: deferred  Ambulation/Gait                Stairs            Wheelchair Mobility    Modified Rankin (Stroke Patients Only)       Balance Overall balance assessment: Needs assistance Sitting-balance support: Bilateral upper extremity supported Sitting balance-Leahy Scale: Fair Sitting balance - Comments: statically x 4 minutes                                     Pertinent Vitals/Pain Pain Assessment: Faces Faces Pain Scale: Hurts little more Pain Location: R hand Pain Descriptors / Indicators: Sore Pain Intervention(s): Monitored during session;Repositioned    Home Living Family/patient expects to be discharged to:: Private residence Living Arrangements: Spouse/significant other Available Help at Discharge: Family;Available 24 hours/day Type of Home: House Home Access: Stairs to enter Entrance Stairs-Rails:  Can reach both Entrance Stairs-Number of Steps: 4-5 Home Layout: Two level;Bed/bath upstairs Home Equipment: Bedside commode;Hospital bed;Wheelchair - Education administrator (comment)(transfer board) Additional Comments: Pt wife has current rotator cuff tear, has been struggling to care for pt at home per [facility] with PT.    Prior Function Level of Independence: Needs assistance   Gait / Transfers Assistance Needed: wife has been rolling pt in bed  ADL's / Homemaking Assistance Needed: wife assists with  ADL's, pt self feeds   Comments: pt unable to offer information regarding home set up or PLOF      Hand Dominance   Dominant Hand: Right    Extremity/Trunk Assessment   Upper Extremity Assessment Upper Extremity Assessment: Generalized weakness    Lower Extremity Assessment Lower Extremity Assessment: LLE deficits/detail;Generalized weakness LLE Deficits / Details: mobilized with brace in place, pt able to perform ankle pumps, appears to have grossly 2+/5 strength (observed with bed mobility)    Cervical / Trunk Assessment Cervical / Trunk Assessment: Kyphotic  Communication   Communication: No difficulties  Cognition Arousal/Alertness: Awake/alert Behavior During Therapy: Flat affect Overall Cognitive Status: History of cognitive impairments - at baseline                                 General Comments: pt with memory deficits, slow processing, increased time and multimodal cues to follow commands      General Comments      Exercises     Assessment/Plan    PT Assessment Patient needs continued PT services  PT Problem List Decreased strength;Decreased mobility;Decreased activity tolerance;Decreased balance;Decreased knowledge of use of DME;Decreased cognition       PT Treatment Interventions Functional mobility training;DME instruction;Therapeutic activities;Therapeutic exercise;Balance training;Patient/family education;Wheelchair mobility training    PT Goals (Current goals can be found in the Care Plan section)  Acute Rehab PT Goals Patient Stated Goal: wife wants to take pt home, plans to hire help as she is fatigued PT Goal Formulation: With patient/family Time For Goal Achievement: 03/29/19 Potential to Achieve Goals: Fair    Frequency Min 2X/week   Barriers to discharge Decreased caregiver support spouse reports she is attempting to obtain more physical assist for pt as she is having trouble even turning pt in bed    Co-evaluation  PT/OT/SLP Co-Evaluation/Treatment: Yes Reason for Co-Treatment: For patient/therapist safety;Necessary to address cognition/behavior during functional activity PT goals addressed during session: Mobility/safety with mobility OT goals addressed during session: ADL's and self-care       AM-PAC PT "6 Clicks" Mobility  Outcome Measure Help needed turning from your back to your side while in a flat bed without using bedrails?: Total Help needed moving from lying on your back to sitting on the side of a flat bed without using bedrails?: Total Help needed moving to and from a bed to a chair (including a wheelchair)?: Total Help needed standing up from a chair using your arms (e.g., wheelchair or bedside chair)?: Total Help needed to walk in hospital room?: Total Help needed climbing 3-5 steps with a railing? : Total 6 Click Score: 6    End of Session   Activity Tolerance: Other (comment)(pt limited by his cognition) Patient left: in bed;with call bell/phone within reach;with bed alarm set   PT Visit Diagnosis: Muscle weakness (generalized) (M62.81);Other abnormalities of gait and mobility (R26.89)    Time: 1034-1050 PT Time Calculation (min) (ACUTE ONLY): 16 min   Charges:   PT  Evaluation $PT Eval Low Complexity: [place], PT, DPT Acute Rehabilitation Services Office: [phone] Pager: [phone]  Trena Platt 03/22/2019, 12:27 PM

## 2019-03-22 NOTE — Progress Notes (Signed)
ANTICOAGULATION CONSULT NOTE - Follow Up Consult  Pharmacy Consult for Coumadin Indication: atrial fibrillation  No Known Allergies  Patient Measurements: Height: 5\' 10"  (177.8 cm) Weight: 186 lb 8.2 oz (84.6 kg) IBW/kg (Calculated) : 73  Vital Signs: Temp: 98.4 F (36.9 C) (05/11 0527) Temp Source: Oral (05/11 0527) BP: 110/66 (05/11 0644) Pulse Rate: 111 (05/11 0644)  Labs: Recent Labs    03/20/19 0328 03/21/19 0353 03/22/19 0258  HGB 9.7* 9.7*  --   HCT 31.1* 30.2*  --   PLT 350 349  --   LABPROT 31.2* 25.1* 27.5*  INR 3.1* 2.3* 2.6*  CREATININE 0.86 0.73  --     Estimated Creatinine Clearance: 83.6 mL/min (by C-G formula based on SCr of 0.73 mg/dL).   Medications:  Coumadin PTA regimen: 5mg  MWF; 2.5mg  TTSaSu  Assessment: 73 year old male admitted 5/7 with pyelonephritis and E coli bacteremia. He is on chronic anticoagulation with Coumadin for atrial fibrillation. His INR was supratherapeutic on admission, likely due to acute illness and recent Levaquin therapy.  INR now therapeutic.  Will continue with home regimen.  Goal of Therapy:  INR 2-3 Monitor platelets by anticoagulation protocol: Yes   Plan:  Warfarin 2.5mg  x1 tonight Daily PT/INR  Monitor for bleeding  [employer], Pharm.D., BCPS Clinical Pharmacist Pager: [phone] Clinical phone for 03/22/2019 from 8:30-4:00 is [phone].  **Pharmacist phone directory can now be found on [email or web address] (PW [email or web address]).  Listed under [place].  03/22/2019 11:31 AM

## 2019-03-22 NOTE — TOC Transition Note (Signed)
Transition of Care [facility]) - CM/SW Discharge Note   Patient Details  Name: Russell Dillon MRN: [id number] Date of Birth: February 08, 1946  Transition of Care [facility]) CM/SW Contact:  Zenon Mayo, RN Phone Number: 03/22/2019, 11:01 AM   Clinical Narrative:    From home with wife, wife states they have [facility] for [facility] services and would like to continue with [facility], Referral given to Butch Penny, for [facility], [place], [place], aide and social worker.  Soc will begin 24-48 hrs post dc.  Wife states they have all the DME they need they do not need any other DME.  She states that she would like for patient to receive his b12 shot before he leaves today because he is scheduled to go to Dr. Brigitte Pulse tomorrow but will not be able to.  This information was given to Janett Billow to give to MD. Then NCM will also give this infor to MD.     Final next level of care: Home w Home Health Services Barriers to Discharge: No Barriers Identified   Patient Goals and CMS Choice Patient states their goals for this hospitalization and ongoing recovery are:: to go home CMS Medicare.gov Compare Post Acute Care list provided to:: Patient Represenative (must comment)(spouse) Choice offered to / list presented to : Spouse  Discharge Placement                       Discharge Plan and Services In-house Referral: NA Discharge Planning Services: CM Consult Post Acute Care Choice: Home Health          DME Arranged: N/A DME Agency: NA       HH Arranged: RN, PT, OT, Nurse's Aide, Social Work [employer] Agency: [place] ([place]) Date [place]: 03/22/19 Time [place]: 1100 Representative spoke with at [place]: [place] ([place]) Interventions     Readmission Risk Interventions Readmission Risk Prevention Plan 03/22/2019 02/15/2019  Transportation Screening Complete Complete  PCP or Specialist Appt within 5-7 Days - Complete  PCP or Specialist Appt within 3-5 Days Complete -   Home Care Screening - Complete  Medication Review (RN CM) - Complete  HRI or Home Care Consult Complete -  Social Work Consult for [place] Planning/Counseling Complete -  Palliative Care Screening Not Complete -  Palliative Care Screening Not Complete Comments NA -  Medication Review (RN Care Manager) Complete -  Some recent data might be hidden

## 2019-03-22 NOTE — Care Management Important Message (Signed)
Important Message  Patient Details  Name: Russell Dillon MRN: [id number] Date of Birth: [birth year]   Medicare Important Message Given:  Yes    Zenon Mayo, RN 03/22/2019, 10:52 AM

## 2019-03-22 NOTE — Progress Notes (Signed)
PROGRESS NOTE    Russell Dillon  YIF:[id number] DOB: 1946-07-24 DOA: 03/18/2019 PCP: Marton Redwood, MD    Brief Narrative: 73 year old with past medical history significant for chronic A. fib on Coumadin, hyperlipidemia, hypertension recent patellar tendon rupture status post surgery 3 weeks ago, dementia and bipolar who came to the ED with confusion, hematuria and fever.  Patient was recently treated orally for UTI by his primary care doctor with Levaquin.   She is admitted with sepsis from urinary tract infection.  Due to recent knee surgery orthopedic was consulted.  They evaluated the patient and they do not think that patient's knee is infected. Patient 1 out of 2 blood cultures is growing E. coli.  Assessment & Plan:   Principal Problem:   Sepsis, Gram negative ([place]) Active Problems:   GERD   Atrial fibrillation, chronic   Long term (current) use of anticoagulants   HTN (hypertension)   Hyperlipidemia   Dementia without behavioral disturbance (HCC)   Leukocytosis   Atrial fibrillation (HCC)  1-Sepsis secondary to urinary tract infection: e coli bacteremia  Continue with IV fluids, IV ceftriaxone.  Cultures positive for E. coli.  Follow sensitivity. Sensitive to ceftriaxone.  Renal ultrasound performed and it was negative for hydronephrosis or pyelonephritis.  Showed diverticuli enlargement of prostate gland. Review ultrasound with urology who recommend for patient to follow-up as an outpatient. WBC normalized. Repeat in am.  Repeated blood culture 03-22-2019 no growth to date.   2-Right had, swelling 4th and 5th finger contraction, unable to extended, mild redness knuckle.  Notice this today.  Will ask ortho to evaluated. Concern for infection.   3-A. fib with RVR:  Coumadin per pharmacy to dose.  Still titrating Cardizem, . So far has received 3 doses today of Cardizem.   4-Dementia: resume lithium.   5-Acute metabolic encephalopathy: Likely related to acute  infection and sepsis. Improving. Gets confuse and irritated at times.   6-hyperlipidemia; per wife patient was taking off of his statin.  7-hypokalemia replete orally. Replete mg.  8-recent Left knee Sx, tendon rupture repair. orthopedic informed of admission.  No evidence of infections.   9-Metabolic acidosis; IV fluids, IV bolus.   10-A fib; Remune Cardizem low dose, but holding some doses due to soft BP. HR below 115.   11-Anemia; hb today at 9. On admission at 69. pior hb at 9. Suspect hb on admission due to hemoconcentration.  Hb has remain stable.   Estimated body mass index is 26.76 kg/m as calculated from the following:   Height as of this encounter: 5\' 10"  (1.778 m).   Weight as of this encounter: 84.6 kg.   DVT prophylaxis: Coumadin, supratherapeutic INR Code Status: Full code Family Communication: Wife updated over the phone Disposition Plan: continue to monitor HR, he has been able to tolerate Cardizem so far. New problem is right hand swelling concern for infection. Ortho has been consulted.    Consultants:   Ortho   Procedures:  Renal ultrasound; Areas of bladder wall thickening, potentially related to trabeculation. 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  2. No hydronephrosis.   Antimicrobials:   Ceftriaxone   Subjective: He is irritated today. Keeps his right hand close, cant extend his finger. Redness and warm on palpation knuckles 3,4,5 finger.    Objective: Vitals:   03/22/19 0503 03/22/19 0527 03/22/19 0644 03/22/19 1304  BP: (!) 106/91  110/66 114/74  Pulse: (!) 109  Marland Kitchen)  111   Resp: 15  15   Temp: 98.4 F (36.9 C) 98.4 F (36.9 C)    TempSrc: Oral Oral    SpO2: 100%  98%   Weight:      Height:        Intake/Output Summary (Last 24 hours) at 03/22/2019 1432 Last data filed at 03/22/2019 1000 Gross per 24 hour  Intake 1814.79 ml   Output 1600 ml  Net 214.79 ml   Filed Weights   03/18/19 2337 03/19/19 0418  Weight: 82.5 kg 84.6 kg    Examination:  General exam: NAD Respiratory system: CTA Cardiovascular system: S 1, S 2 IRR Gastrointestinal system: BS present, soft, nt Central nervous system: alert, follows command.  Extremities: Left knee incision healing. Right hand with edema, redness, unable to extend 4,5 finger  Skin: No rashes, lesions or ulcers    Data Reviewed: I have personally reviewed following labs and imaging studies  CBC: Recent Labs  Lab 03/18/19 1819 03/19/19 0241 03/20/19 0328 03/21/19 0353  WBC 22.5* 22.0* 13.6* 9.6  NEUTROABS 20.5*  --   --   --   HGB 12.5* 11.2* 9.7* 9.7*  HCT 39.8 35.1* 31.1* 30.2*  MCV 87.3 85.6 86.4 84.6  PLT 409* 374 350 182   Basic Metabolic Panel: Recent Labs  Lab 03/18/19 1819 03/19/19 0241 03/20/19 0328 03/21/19 0353  NA 137 140 138 140  K 3.5 3.4* 3.3* 3.5  CL 105 107 111 110  CO2 20* 22 17* 20*  GLUCOSE 134* 126* 113* 107*  BUN 17 15 17 16   CREATININE 1.06 1.06 0.86 0.73  CALCIUM 8.8* 8.3* 8.2* 8.0*  MG  --   --  1.7  --    GFR: Estimated Creatinine Clearance: 83.6 mL/min (by C-G formula based on SCr of 0.73 mg/dL). Liver Function Tests: Recent Labs  Lab 03/18/19 1819 03/19/19 0241  AST 17 15  ALT 40 29  ALKPHOS 113 102  BILITOT 1.4* 0.9  PROT 7.1 6.3*  ALBUMIN 2.6* 2.2*   No results for input(s): LIPASE, AMYLASE in the last 168 hours. No results for input(s): AMMONIA in the last 168 hours. Coagulation Profile: Recent Labs  Lab 03/18/19 1819 03/19/19 0732 03/20/19 0328 03/21/19 0353 03/22/19 0258  INR 4.0* 4.4* 3.1* 2.3* 2.6*   Cardiac Enzymes: No results for input(s): CKTOTAL, CKMB, CKMBINDEX, TROPONINI in the last 168 hours. BNP (last 3 results) No results for input(s): PROBNP in the last 8760 hours. HbA1C: No results for input(s): HGBA1C in the last 72 hours. CBG: No results for input(s): GLUCAP in the last  168 hours. Lipid Profile: No results for input(s): CHOL, HDL, LDLCALC, TRIG, CHOLHDL, LDLDIRECT in the last 72 hours. Thyroid Function Tests: No results for input(s): TSH, T4TOTAL, FREET4, T3FREE, THYROIDAB in the last 72 hours. Anemia Panel: No results for input(s): VITAMINB12, FOLATE, FERRITIN, TIBC, IRON, RETICCTPCT in the last 72 hours. Sepsis Labs: Recent Labs  Lab 03/18/19 1835 03/18/19 2339  LATICACIDVEN 1.4 1.1    Recent Results (from the past 240 hour(s))  Blood culture (routine x 2)     Status: None (Preliminary result)   Collection Time: 03/18/19  6:56 PM  Result Value Ref Range Status   Specimen Description BLOOD RIGHT ANTECUBITAL  Final   Special Requests   Final    BOTTLES DRAWN AEROBIC AND ANAEROBIC Blood Culture adequate volume   Culture   Final    NO GROWTH 4 DAYS Performed at [facility], [place] [address],  [place], [place] [address]    Report Status PENDING  Incomplete  Blood culture (routine x 2)     Status: Abnormal   Collection Time: 03/18/19  7:05 PM  Result Value Ref Range Status   Specimen Description BLOOD BLOOD RIGHT FOREARM  Final   Special Requests   Final    BOTTLES DRAWN AEROBIC AND ANAEROBIC Blood Culture results may not be optimal due to an inadequate volume of blood received in culture bottles   Culture  Setup Time   Final    GRAM NEGATIVE RODS ANAEROBIC BOTTLE ONLY CRITICAL RESULT CALLED TO, READ BACK BY AND VERIFIED WITH: PHARMD R FANNING [id number] AT 1034 AM BY CM Performed at [facility], [place] [address], [place], [place] [address]    Culture ESCHERICHIA COLI (A)  Final   Report Status 03/21/2019 FINAL  Final   Organism ID, Bacteria ESCHERICHIA COLI  Final      Susceptibility   Escherichia coli - MIC*    AMPICILLIN >=32 RESISTANT Resistant     CEFAZOLIN <=4 SENSITIVE Sensitive     CEFEPIME <=1 SENSITIVE Sensitive     CEFTAZIDIME <=1 SENSITIVE Sensitive     CEFTRIAXONE <=1 SENSITIVE Sensitive     CIPROFLOXACIN >=4 RESISTANT  Resistant     GENTAMICIN <=1 SENSITIVE Sensitive     IMIPENEM <=0.25 SENSITIVE Sensitive     TRIMETH/SULFA <=20 SENSITIVE Sensitive     AMPICILLIN/SULBACTAM >=32 RESISTANT Resistant     PIP/TAZO <=4 SENSITIVE Sensitive     Extended ESBL NEGATIVE Sensitive     * ESCHERICHIA COLI  Blood Culture ID Panel (Reflexed)     Status: Abnormal   Collection Time: 03/18/19  7:05 PM  Result Value Ref Range Status   Enterococcus species NOT DETECTED NOT DETECTED Final   Listeria monocytogenes NOT DETECTED NOT DETECTED Final   Staphylococcus species NOT DETECTED NOT DETECTED Final   Staphylococcus aureus (BCID) NOT DETECTED NOT DETECTED Final   Streptococcus species NOT DETECTED NOT DETECTED Final   Streptococcus agalactiae NOT DETECTED NOT DETECTED Final   Streptococcus pneumoniae NOT DETECTED NOT DETECTED Final   Streptococcus pyogenes NOT DETECTED NOT DETECTED Final   Acinetobacter baumannii NOT DETECTED NOT DETECTED Final   Enterobacteriaceae species DETECTED (A) NOT DETECTED Final    Comment: Enterobacteriaceae represent a large family of gram-negative bacteria, not a single organism. CRITICAL RESULT CALLED TO, READ BACK BY AND VERIFIED WITH: PHARMD R FANNING [id number] AT 1034 AM BY CM    Enterobacter cloacae complex NOT DETECTED NOT DETECTED Final   Escherichia coli DETECTED (A) NOT DETECTED Final    Comment: CRITICAL RESULT CALLED TO, READ BACK BY AND VERIFIED WITH: PHARMD R FANNING [id number] AT 1034 BY CM    Klebsiella oxytoca NOT DETECTED NOT DETECTED Final   Klebsiella pneumoniae NOT DETECTED NOT DETECTED Final   Proteus species NOT DETECTED NOT DETECTED Final   Serratia marcescens NOT DETECTED NOT DETECTED Final   Carbapenem resistance NOT DETECTED NOT DETECTED Final   Haemophilus influenzae NOT DETECTED NOT DETECTED Final   Neisseria meningitidis NOT DETECTED NOT DETECTED Final   Pseudomonas aeruginosa NOT DETECTED NOT DETECTED Final   Candida albicans NOT DETECTED NOT DETECTED Final    Candida glabrata NOT DETECTED NOT DETECTED Final   Candida krusei NOT DETECTED NOT DETECTED Final   Candida parapsilosis NOT DETECTED NOT DETECTED Final   Candida tropicalis NOT DETECTED NOT DETECTED Final    Comment: Performed at [facility], [place] [address] [place], [place]  [address]  SARS Coronavirus 2 (CEPHEID - Performed in [place] hospital lab), Hosp Order     Status: None   Collection Time: 03/18/19  7:28 PM  Result Value Ref Range Status   SARS Coronavirus 2 NEGATIVE NEGATIVE Final    Comment: (NOTE) If result is NEGATIVE SARS-CoV-2 target nucleic acids are NOT DETECTED. The SARS-CoV-2 RNA is generally detectable in upper and lower  respiratory specimens during the acute phase of infection. The lowest  concentration of SARS-CoV-2 viral copies this assay can detect is 250  copies / mL. A negative result does not preclude SARS-CoV-2 infection  and should not be used as the sole basis for treatment or other  patient management decisions.  A negative result may occur with  improper specimen collection / handling, submission of specimen other  than nasopharyngeal swab, presence of viral mutation(s) within the  areas targeted by this assay, and inadequate number of viral copies  (<250 copies / mL). A negative result must be combined with clinical  observations, patient history, and epidemiological information. If result is POSITIVE SARS-CoV-2 target nucleic acids are DETECTED. The SARS-CoV-2 RNA is generally detectable in upper and lower  respiratory specimens dur ing the acute phase of infection.  Positive  results are indicative of active infection with SARS-CoV-2.  Clinical  correlation with patient history and other diagnostic information is  necessary to determine patient infection status.  Positive results do  not rule out bacterial infection or co-infection with other viruses. If result is PRESUMPTIVE POSTIVE SARS-CoV-2 nucleic acids MAY BE PRESENT.   A  presumptive positive result was obtained on the submitted specimen  and confirmed on repeat testing.  While 2019 novel coronavirus  (SARS-CoV-2) nucleic acids may be present in the submitted sample  additional confirmatory testing may be necessary for epidemiological  and / or clinical management purposes  to differentiate between  SARS-CoV-2 and other Sarbecovirus currently known to infect humans.  If clinically indicated additional testing with an alternate test  methodology [phone]) is advised. The SARS-CoV-2 RNA is generally  detectable in upper and lower respiratory sp ecimens during the acute  phase of infection. The expected result is Negative. Fact Sheet for Patients:  StrictlyIdeas.no Fact Sheet for Healthcare Providers: BankingDealers.co.za This test is not yet approved or cleared by the [place] FDA and has been authorized for detection and/or diagnosis of SARS-CoV-2 by FDA under an Emergency Use Authorization (EUA).  This EUA will remain in effect (meaning this test can be used) for the duration of the COVID-19 declaration under Section 564(b)(1) of the Act, 21 U.S.C. section 360bbb-3(b)(1), unless the authorization is terminated or revoked sooner. Performed at [facility], [place] [address], [place], [place] [address]   Urine culture     Status: Abnormal   Collection Time: 03/18/19  9:16 PM  Result Value Ref Range Status   Specimen Description URINE, CLEAN CATCH  Final   Special Requests   Final    NONE Performed at [facility], [place] [address] [place], [place] [address]    Culture 50,000 COLONIES/mL ESCHERICHIA COLI (A)  Final   Report Status 03/20/2019 FINAL  Final   Organism ID, Bacteria ESCHERICHIA COLI (A)  Final      Susceptibility   Escherichia coli - MIC*    AMPICILLIN >=32 RESISTANT Resistant     CEFAZOLIN <=4 SENSITIVE Sensitive     CEFTRIAXONE <=1 SENSITIVE Sensitive     CIPROFLOXACIN >=4  RESISTANT Resistant  GENTAMICIN <=1 SENSITIVE Sensitive     IMIPENEM <=0.25 SENSITIVE Sensitive     NITROFURANTOIN <=16 SENSITIVE Sensitive     TRIMETH/SULFA <=20 SENSITIVE Sensitive     AMPICILLIN/SULBACTAM >=32 RESISTANT Resistant     PIP/TAZO <=4 SENSITIVE Sensitive     Extended ESBL NEGATIVE Sensitive     * 50,000 COLONIES/mL ESCHERICHIA COLI  MRSA PCR Screening     Status: None   Collection Time: 03/19/19  1:19 PM  Result Value Ref Range Status   MRSA by PCR NEGATIVE NEGATIVE Final    Comment:        The GeneXpert MRSA Assay (FDA approved for NASAL specimens only), is one component of a comprehensive MRSA colonization surveillance program. It is not intended to diagnose MRSA infection nor to guide or monitor treatment for MRSA infections. Performed at [facility], [place] [address], [place], [place] [address]   Culture, blood (routine x 2)     Status: None (Preliminary result)   Collection Time: 03/21/19  1:52 PM  Result Value Ref Range Status   Specimen Description BLOOD LEFT HAND  Final   Special Requests AEROBIC BOTTLE ONLY Blood Culture adequate volume  Final   Culture   Final    NO GROWTH < 24 HOURS Performed at [facility], [place] [address], [place], [place] [address]    Report Status PENDING  Incomplete  Culture, blood (routine x 2)     Status: None (Preliminary result)   Collection Time: 03/21/19  1:53 PM  Result Value Ref Range Status   Specimen Description BLOOD LEFT FOREARM  Final   Special Requests   Final    AEROBIC BOTTLE ONLY Blood Culture results may not be optimal due to an inadequate volume of blood received in culture bottles   Culture   Final    NO GROWTH < 24 HOURS Performed at [facility], [place] [address] [place], [place] [address]    Report Status PENDING  Incomplete         Radiology Studies: No results found.      Scheduled Meds: . cyanocobalamin  100 mcg Intramuscular Once  . diltiazem  30 mg Oral Q6H  .  donepezil  10 mg Oral Daily  . feeding supplement (ENSURE ENLIVE)  237 mL Oral BID BM  . lithium carbonate  150 mg Oral QHS  . potassium chloride  40 mEq Oral Once  . Warfarin - Pharmacist Dosing Inpatient   Does not apply q1800   Continuous Infusions: . sodium chloride 100 mL/hr at 03/22/19 1120  . cefTRIAXone (ROCEPHIN)  IV       LOS: 4 days    Time spent: 35 minutes    Elmarie Shiley, MD [facility] Pager [phone] If 7PM-7AM, please contact night-coverage [email or web address] Password [email or web address] 03/22/2019, 2:32 PM

## 2019-03-22 NOTE — Evaluation (Signed)
Occupational Therapy Evaluation and Discharge Patient Details Name: Russell Dillon MRN: [id number] DOB: April 25, 1946 Today's Date: 03/22/2019    History of Present Illness Pt is a 73 y.o. M with significant PMH of dementia, atrial fibrillation, bipolar disorder and L patellar tendon rupture with surgical repairs x 2 in April who was admitted 03/18/19 with sepsis with UTI and sepsis. Pt was discharged home from CIR on 4/20. Per wife, he has been bed bound and dependent since return home, home health therapy is involved.     Clinical Impression   Per TC with wife and PT, pt was essentially bedbound and dependent in ADL prior to readmission. Pt presents with impaired cognition, generalized weakness and is dependent in B UE support for static sitting at EOB. Pt requires 2 person assist for bed mobility. Pt's wife is declining SNF, states pt will do better at home. Will defer further OT back ot [facility].    Follow Up Recommendations  Home health OT;Supervision/Assistance - 24 hour    Equipment Recommendations  None recommended by OT    Recommendations for Other Services       Precautions / Restrictions Precautions Precautions: Fall Required Braces or Orthoses: Other Brace Other Brace: L hinged knee brace Restrictions Weight Bearing Restrictions: Yes LLE Weight Bearing: Weight bearing as tolerated      Mobility Bed Mobility Overal bed mobility: Needs Assistance Bed Mobility: Supine to Sit;Sit to Supine     Supine to sit: +2 for physical assistance;Max assist Sit to supine: +2 for physical assistance;Max assist   General bed mobility comments: cues for sequencing, assist for LEs and trunk  Transfers                 General transfer comment: deferred    Balance Overall balance assessment: Needs assistance Sitting-balance support: Bilateral upper extremity supported Sitting balance-Leahy Scale: Fair Sitting balance - Comments: statically x 4 minutes                                    ADL either performed or assessed with clinical judgement   ADL Overall ADL's : At baseline                                       General ADL Comments: pt is dependent at home     Vision Baseline Vision/History: Wears glasses Wears Glasses: At all times Patient Visual Report: No change from baseline       Perception     Praxis      Pertinent Vitals/Pain Pain Assessment: Faces Faces Pain Scale: Hurts little more Pain Location: R hand Pain Descriptors / Indicators: Sore Pain Intervention(s): Monitored during session     Hand Dominance Right   Extremity/Trunk Assessment Upper Extremity Assessment Upper Extremity Assessment: Generalized weakness   Lower Extremity Assessment Lower Extremity Assessment: Defer to PT evaluation   Cervical / Trunk Assessment Cervical / Trunk Assessment: Kyphotic   Communication Communication Communication: No difficulties   Cognition Arousal/Alertness: Awake/alert Behavior During Therapy: Flat affect Overall Cognitive Status: History of cognitive impairments - at baseline                                 General Comments: pt with memory deficits, slow processing, increased time and multimodal  cues to follow commands   General Comments       Exercises     Shoulder Instructions      Home Living Family/patient expects to be discharged to:: Private residence Living Arrangements: Spouse/significant other Available Help at Discharge: Family;Available 24 hours/day Type of Home: House Home Access: Stairs to enter [employer] of Steps: 4-5 Entrance Stairs-Rails: Can reach both Home Layout: Two level;Bed/bath upstairs Alternate Level Stairs-Number of Steps: pt sleeps in hospital bed on first floor   Bathroom Shower/Tub: Occupational psychologist: Standard     Home Equipment: Bedside commode;Hospital bed;Wheelchair - Education administrator (comment)(transfer board)    Additional Comments: Pt wife has current rotator cuff tear, has been struggling to care for pt at home per [facility] with PT.  Lives With: Spouse    Prior Functioning/Environment Level of Independence: Needs assistance  Gait / Transfers Assistance Needed: wife has been rolling pt in bed ADL's / Homemaking Assistance Needed: wife assists with ADL's, pt self feeds    Comments: pt unable to offer information regarding home set up or PLOF         OT Problem List: Decreased strength;Decreased activity tolerance;Impaired balance (sitting and/or standing);Decreased cognition;Pain      OT Treatment/Interventions:      OT Goals(Current goals can be found in the care plan section) Acute Rehab OT Goals Patient Stated Goal: wife wants to take pt home, plans to hire help as she is fatigued  OT Frequency:     Barriers to D/C:            Co-evaluation PT/OT/SLP Co-Evaluation/Treatment: Yes            AM-PAC OT "6 Clicks" Daily Activity     Outcome Measure Help from another person eating meals?: A Little Help from another person taking care of personal grooming?: A Lot Help from another person toileting, which includes using toliet, bedpan, or urinal?: Total Help from another person bathing (including washing, rinsing, drying)?: Total Help from another person to put on and taking off regular upper body clothing?: A Lot Help from another person to put on and taking off regular lower body clothing?: Total 6 Click Score: 10   End of Session    Activity Tolerance: Patient tolerated treatment well Patient left: in bed;with call bell/phone within reach;with bed alarm set  OT Visit Diagnosis: Pain;Muscle weakness (generalized) (M62.81);Other symptoms and signs involving cognitive function                Time: 6283-1517 OT Time Calculation (min): 25 min Charges:  OT General Charges $OT Visit: 1 Visit OT Evaluation $OT Eval Moderate Complexity: 1 Mod  [name], OTR/L Acute  Rehabilitation Services Pager: [phone] Office: [phone]  [name] 03/22/2019, 11:14 AM

## 2019-03-22 NOTE — Consult Note (Addendum)
Reason for Consult:Right hand pain  Referring Physician: B Jahmai Finelli is an 73 y.o. male.  HPI: Russell Dillon was admitted Friday with urosepsis and was found to have E coli bacteremia. He has been improving and was set for discharge today but was c/o right hand pain and either couldn't or wouldn't extend his ring and little fingers. He is still confused and confabulates when questioned. He thinks the pain has been there since last week but his wife (via telephone) says he did not have it when he came to the hospital Friday.   Past Medical History:  Diagnosis Date  . Aortic valve regurgitation 10/16/2018   Echo 07/28/2017:  EF 60-65, moderate AI, aortic root and ascending aorta mildly dilated (40 mm), ascending aorta 43 mm, MAC, mild MR, mild LAE, moderate RV enlargement, trivial TR // Echo 12/19: EF 60-65, normal wall motion, mild AI, moderate BAE, ascending aorta 43 mm, aortic root 39 mm  . BPH (benign prostatic hypertrophy)   . Chronic anticoagulation   . Chronic atrial fibrillation   . Diastolic dysfunction October 2010   Normal LV systolic function  . Hx SBO   . Hyperlipidemia   . Hypertension   . Patellar tendon rupture, left, initial encounter 02/10/2019  . Small bowel obstruction [facility])     Past Surgical History:  Procedure Laterality Date  . APPENDECTOMY  2004  . APPLICATION OF WOUND VAC Left 02/11/2019   Procedure: Application Of Wound Vac;  Surgeon: Altamese Tamaqua, MD;  Location: [place];  Service: Orthopedics;  Laterality: Left;  . CARDIOVASCULAR STRESS TEST  09/30/2007   EF 67%  No ischemia.   Marland Kitchen CARDIOVERSION  05/01/2004  . KNEE ARTHROSCOPY Left 02/11/2019   Procedure: ARTHROSCOPY LEFT KNEE;  Surgeon: Altamese Hilldale, MD;  Location: [facility];  Service: Orthopedics;  Laterality: Left;  . PATELLAR TENDON REPAIR Left 02/11/2019   Procedure: LEFT PATELLA TENDON REPAIR;  Surgeon: Altamese [place], MD;  Location: [place];  Service: Orthopedics;  Laterality: Left;  . SMALL INTESTINE  SURGERY  2004  . US ECHOCARDIOGRAPHY  09/05/2009   ef 55-60%    Family History  Problem Relation Age of Onset  . Heart attack Mother   . Hypertension Mother   . Diabetes Father   . Heart disease Father   . Colon cancer Neg Hx   . Esophageal cancer Neg Hx   . Pancreatic cancer Neg Hx   . Prostate cancer Neg Hx   . Rectal cancer Neg Hx   . Stomach cancer Neg Hx     Social History:  reports that he has never smoked. He has never used smokeless tobacco. He reports that he does not drink alcohol or use drugs.  Allergies: No Known Allergies  Medications: I have reviewed the patient's current medications.  Results for orders placed or performed during the hospital encounter of 03/18/19 (from the past 48 hour(s))  Protime-INR     Status: Abnormal   Collection Time: 03/21/19  3:53 AM  Result Value Ref Range   Prothrombin Time 25.1 (H) 11.4 - 15.2 seconds   INR 2.3 (H) 0.8 - 1.2    Comment: (NOTE) INR goal varies based on device and disease states. Performed at [facility], [place] [address] [place] [address]   CBC     Status: Abnormal   Collection Time: 03/21/19  3:53 AM  Result Value Ref Range   WBC 9.6 4.0 - 10.5 K/uL   RBC 3.57 (L) 4.22 -  5.81 MIL/uL   Hemoglobin 9.7 (L) 13.0 - 17.0 g/dL   HCT 30.2 (L) 39.0 - 52.0 %   MCV 84.6 80.0 - 100.0 fL   MCH 27.2 26.0 - 34.0 pg   MCHC 32.1 30.0 - 36.0 g/dL   RDW 18.2 (H) 11.5 - 15.5 %   Platelets 349 150 - 400 K/uL   nRBC 0.0 0.0 - 0.2 %    Comment: Performed at [facility] [address], [place], [place] [address]  Basic metabolic panel     Status: Abnormal   Collection Time: 03/21/19  3:53 AM  Result Value Ref Range   Sodium 140 135 - 145 mmol/L   Potassium 3.5 3.5 - 5.1 mmol/L   Chloride 110 98 - 111 mmol/L   CO2 20 (L) 22 - 32 mmol/L   Glucose, Bld 107 (H) 70 - 99 mg/dL   BUN 16 8 - 23 mg/dL   Creatinine, Ser 0.73 0.61 - 1.24 mg/dL   Calcium 8.0 (L) 8.9 - 10.3 mg/dL   GFR calc non Af Amer >60  >60 mL/min   GFR calc Af Amer >60 >60 mL/min   Anion gap 10 5 - 15    Comment: Performed at [facility], [place] [address] [place], [place] [address]  Culture, blood (routine x 2)     Status: None (Preliminary result)   Collection Time: 03/21/19  1:52 PM  Result Value Ref Range   Specimen Description BLOOD LEFT HAND    Special Requests AEROBIC BOTTLE ONLY Blood Culture adequate volume    Culture      NO GROWTH < 24 HOURS Performed at [facility] [address] [place], [place] [address]    Report Status PENDING   Culture, blood (routine x 2)     Status: None (Preliminary result)   Collection Time: 03/21/19  1:53 PM  Result Value Ref Range   Specimen Description BLOOD LEFT FOREARM    Special Requests      AEROBIC BOTTLE ONLY Blood Culture results may not be optimal due to an inadequate volume of blood received in culture bottles   Culture      NO GROWTH < 24 HOURS Performed at [facility] [address] [place], [place] [address]    Report Status PENDING   Protime-INR     Status: Abnormal   Collection Time: 03/22/19  2:58 AM  Result Value Ref Range   Prothrombin Time 27.5 (H) 11.4 - 15.2 seconds   INR 2.6 (H) 0.8 - 1.2    Comment: (NOTE) INR goal varies based on device and disease states. Performed at [facility], [place] [address], [place], [place] [address]     No results found.  Review of Systems  Unable to perform ROS: Dementia  Musculoskeletal: Positive for joint pain (Right hand).   Blood pressure 114/74, pulse (!) 111, temperature 98.4 F (36.9 C), temperature source Oral, resp. rate 15, height _0  (1.778 m), weight 84.6 kg, SpO2 98 %. Physical Exam  Constitutional: He appears well-developed and well-nourished. No distress.  HENT:  Head: Normocephalic and atraumatic.  Eyes: Conjunctivae are normal. Right eye exhibits no discharge. Left eye exhibits no discharge. No scleral icterus.  Neck: Normal range of motion.  Cardiovascular:  Normal rate and regular rhythm.  Respiratory: Effort normal. No respiratory distress.  Musculoskeletal:     Comments: Right shoulder, elbow, wrist, digits- no skin wounds, mod TTP 4th and 5th MCP  joints, 5>4, severe pain with passive extension of 5th (<5 degrees), mod pain with passive extension of 4th (~15 degrees), minimal pain with passive extension of 3rd, no instability  Sens  Ax/R/M/U intact  Mot   Ax/ R/ PIN/ M/ AIN/ U intact  Rad 2+  Neurological: He is alert.  Skin: Skin is warm and dry. He is not diaphoretic.  Psychiatric: He has a normal mood and affect. His behavior is normal.      Assessment/Plan: Right hand pain -- Septic arthritis vs flexor tenosynovitis vs gout vs reactive arthritis. Please keep NPO and Dr. Grandville Silos will evaluate later today. Urosepsis with E coli bactermia  Multiple medical problems including AVR, atrial fibrillation on chronic Coumadin, bipolar disorder maintained on lithium, and mild cognitive impairment maintained on Aricept -- per primary service    Lisette Abu, PA-C Orthopedic Surgery [phone] 03/22/2019, 2:45 PM   Right hand preferentially held with ring and small fingers flexed.  There is soreness with palpation on the dorsum of the hand from proximal to the MCP joints distal, although interestingly appears to be no effusion by palpation of the small finger MCP joint.  There is little puffiness about the ring finger.  No significant increased pain with torsional stress applied to either MCP joint.  Flexor extensor tendons appear to be intact.  I spoke with the patient's wife via telephone.  She indicated the patient has a history of gout, and used to get bad gout flares, but has not had any in 10 to 12 years since losing weight.  The clinical appearance of the hand is not highly suspicious for septic arthritis of either the ring or the small finger MCP joint.  In addition, WBC is 9, and he is afebrile, undergoing antibiotic treatment  for presumed urosepsis.  At this point, I do not think septic arthritis is the cause of his right hand condition, but favor crystalline arthropathy such as gout.  I recommend beginning empiric treatment for gout with continued observation, reconsulting hand surgery as may be needed if condition changes such to increase suspicion for infectious etiology.  Micheline Rough, MD Hand Surgery

## 2019-03-22 NOTE — Progress Notes (Addendum)
Patients leg brace soiled.  Order received from Dr Tyrell Antonio for new brace.

## 2019-03-22 NOTE — TOC Transition Note (Signed)
Transition of Care [facility]) - CM/SW Discharge Note   Patient Details  Name: Russell Dillon MRN: [id number] Date of Birth: 12-04-1945  Transition of Care [facility]) CM/SW Contact:  Benard Halsted, LCSW Phone Number: 03/22/2019, 1:38 PM   Clinical Narrative:    CSW received request to speak with patient's wife regarding transportation. CSW spoke with patient's wife. She requested that patient be sent home by [facility]. CSW went over potential cost with her. CSW will arrange.    Final next level of care: [place] Barriers to Discharge: No Barriers Identified   Patient Goals and CMS Choice Patient states their goals for this hospitalization and ongoing recovery are:: to go home CMS Medicare.gov Compare Post Acute Care list provided to:: Patient Represenative (must comment)(spouse) Choice offered to / list presented to : Spouse  Discharge Placement                       Discharge Plan and Services In-house Referral: NA Discharge Planning Services: CM Consult Post Acute Care Choice: Home Health          DME Arranged: N/A DME Agency: NA       HH Arranged: RN, PT, OT, Nurse's Aide, Social Work [employer] Agency: [place] ([place]) Date [place]: 03/22/19 Time [place]: 1100 Representative spoke with at [place]: [place] ([place]) Interventions     Readmission Risk Interventions Readmission Risk Prevention Plan 03/22/2019 02/15/2019  Transportation Screening Complete Complete  PCP or Specialist Appt within 5-7 Days - Complete  PCP or Specialist Appt within 3-5 Days Complete -  Home Care Screening - Complete  Medication Review (RN CM) - Complete  HRI or Home Care Consult Complete -  Social Work Consult for [place] Planning/Counseling Complete -  Palliative Care Screening Not Complete -  Palliative Care Screening Not Complete Comments NA -  Medication Review (RN Care Manager) Complete -  Some  recent data might be hidden

## 2019-03-23 ENCOUNTER — Inpatient Hospital Stay (HOSPITAL_COMMUNITY): Payer: Medicare Other

## 2019-03-23 LAB — CULTURE, BLOOD (ROUTINE X 2)
Culture: NO GROWTH
Special Requests: ADEQUATE

## 2019-03-23 LAB — BASIC METABOLIC PANEL
Anion gap: 7 (ref 5–15)
BUN: 13 mg/dL (ref 8–23)
CO2: 21 mmol/L — ABNORMAL LOW (ref 22–32)
Calcium: 7.9 mg/dL — ABNORMAL LOW (ref 8.9–10.3)
Chloride: 111 mmol/L (ref 98–111)
Creatinine, Ser: 0.7 mg/dL (ref 0.61–1.24)
GFR calc Af Amer: >60 mL/min (ref >60)
GFR calc non Af Amer: >60 mL/min (ref >60)
Glucose, Bld: 119 mg/dL — ABNORMAL HIGH (ref 70–99)
Potassium: 3.5 mmol/L (ref 3.5–5.1)
Sodium: 139 mmol/L (ref 135–145)

## 2019-03-23 LAB — CBC
HCT: 29.1 % — ABNORMAL LOW (ref 39.0–52.0)
Hemoglobin: 9.4 g/dL — ABNORMAL LOW (ref 13.0–17.0)
MCH: 27.6 pg (ref 26.0–34.0)
MCHC: 32.3 g/dL (ref 30.0–36.0)
MCV: 85.3 fL (ref 80.0–100.0)
Platelets: 314 10*3/uL (ref 150–400)
RBC: 3.41 MIL/uL — ABNORMAL LOW (ref 4.22–5.81)
RDW: 18.4 % — ABNORMAL HIGH (ref 11.5–15.5)
WBC: 10.4 10*3/uL (ref 4.0–10.5)
nRBC: 0 % (ref 0.0–0.2)

## 2019-03-23 LAB — PROTIME-INR
INR: 2.6 — ABNORMAL HIGH (ref 0.8–1.2)
Prothrombin Time: 27.6 seconds — ABNORMAL HIGH (ref 11.4–15.2)

## 2019-03-23 MED ORDER — COLCHICINE 0.6 MG PO TABS
0.6000 mg | ORAL_TABLET | Freq: Two times a day (BID) | ORAL | Status: DC
  Administered 2019-03-23 – 2019-03-25 (×4): 0.6 mg via ORAL
  Filled 2019-03-23 (×4): qty 1

## 2019-03-23 MED ORDER — CEFAZOLIN SODIUM-DEXTROSE 2-4 GM/100ML-% IV SOLN
2.0000 g | Freq: Three times a day (TID) | INTRAVENOUS | Status: DC
  Administered 2019-03-23 – 2019-03-25 (×6): 2 g via INTRAVENOUS
  Filled 2019-03-23 (×9): qty 100

## 2019-03-23 MED ORDER — WARFARIN SODIUM 5 MG PO TABS
5.0000 mg | ORAL_TABLET | Freq: Once | ORAL | Status: AC
  Administered 2019-03-23: 17:00:00 5 mg via ORAL
  Filled 2019-03-23: qty 1

## 2019-03-23 NOTE — Consult Note (Signed)
   [facility] CM Inpatient Consult   03/23/2019  Russell Dillon 1946/03/22 [id number]    Patient's chartreviewedfor potential [facility] Care Management services witha25% high risk forunplanned readmissionand with 3 hospitalizations in the past 6 months; under his Medicare plan benefit.  Patient's chartreviewedandhistory and physical on 03/18/19 revealedas follows: Russell Dillon is a 73 y.o. male with medical history significant of chronic atrial fibrillation on warfarin, hyperlipidemia, hypertension, recent patella tendon rupture status post surgery about 3 weeks ago, GERD, dementia and bipolar disorder,   who presented with confusion, fever and hematuria (Sepsis secondary to urinary tract infection- e.coli bacteremia; patient did not discharge yesterday due to right hand, swelling 4th and 5th finger- ortho consulted).  Patient's primary care provider is Dr.William Brigitte Pulse with [facility], listed as providing transition of care.  Spoke with patient's wife over the phone (HIPAA verified) for possible discharge needs, (since patient has dementia- confused) butwife indicated having no current needs for now. She reports living athome with patient and she (with her daughter and son-in law) provide support and assistance with patient's care needs. Patient's wife reports that she has plans in using [place] agency for additional help at home if needed. Wife is aware to contact primary care provider if further health issues arise for patient.  Transition of care CM note reviewed with disposition of discharging patient to home with wife and home health services (RN, PT, OT, Aide and SW) as arranged with [facility] which he was previously active with.  Patient's wife agreed forEMMIcalls to follow-up patient's continuedrecoverypost hospitalization.  Referral made for EMMI General calls to follow-up after discharge.   For questions and referrals, please contact:  Edwena Felty A.  Russell Dillon, BSN, RN-BC [facility] Liaison Cell: [phone]

## 2019-03-23 NOTE — Progress Notes (Signed)
PROGRESS NOTE    Russell Dillon  TKP:[id number] DOB: 02-25-1946 DOA: 03/18/2019 PCP: Marton Redwood, MD    Brief Narrative: 73 year old with past medical history significant for chronic A. fib on Coumadin, hyperlipidemia, hypertension recent patellar tendon rupture status post surgery 3 weeks ago, dementia and bipolar who came to the ED with confusion, hematuria and fever.  Patient was recently treated orally for UTI by his primary care doctor with Levaquin.   He  was  admitted with sepsis from urinary tract infection.  Due to recent knee surgery orthopedic was consulted.  They evaluated the patient and they do not think that patient's knee is infected. Patient 1 out of 2 blood cultures is growing E. Coli.  Repeated blood culture has remained negative.  He will need a total of 2 weeks of antibiotics for bacteremia.  Patient on 5/11 developed right Hein edema and redness.  Hand surgeon was consulted is recommending treatment for acute gout.  Need to monitor closely for development of infection.   Assessment & Plan:   Principal Problem:   Sepsis, Gram negative ([place]) Active Problems:   GERD   Atrial fibrillation, chronic   Long term (current) use of anticoagulants   HTN (hypertension)   Hyperlipidemia   Dementia without behavioral disturbance (HCC)   Leukocytosis   Atrial fibrillation (HCC)  1-Sepsis secondary to urinary tract infection: e coli bacteremia  Continue with IV fluids, IV ceftriaxone.  Cultures positive for E. coli.  Follow sensitivity. Sensitive to ceftriaxone.  Renal ultrasound performed and it was negative for hydronephrosis or pyelonephritis.  Showed diverticuli enlargement of prostate gland. Review ultrasound with urology who recommend for patient to follow-up as an outpatient. WBC normalized. Repeat in am.  Repeated blood culture 03-22-2019 no growth to date.   2-Right had, swelling 4th and 5th finger contraction, unable to extended, mild redness knuckle.  Notice  this today.  Ortho was consulted.  Team patient has probably gout.  They are also advising to monitor closely for improvement or sign of infection. Colchicine 0.6 p.o. twice daily order  3-A. fib with RVR:  Coumadin per pharmacy to dose.  Tolerating Cardizem 60 mg every 6 hours.  Home dose is higher.  4-Dementia: resume lithium.   5-Acute metabolic encephalopathy: Likely related to acute infection and sepsis. Improving. Gets confuse and irritated at times.   6-hyperlipidemia; per wife patient was taking off of his statin.  7-hypokalemia replete orally. Replete mg.  8-recent Left knee Sx, tendon rupture repair. orthopedic informed of admission.  No evidence of infections.   9-Metabolic acidosis; IV fluids, IV bolus.   10-A fib; Remune Cardizem low dose, but holding some doses due to soft BP.  11-Anemia; hb today at 9. On admission at 47. pior hb at 9. Suspect hb on admission due to hemoconcentration.  Hb has remain stable.   Estimated body mass index is 26.76 kg/m as calculated from the following:   Height as of this encounter: 5\' 10"  (1.778 m).   Weight as of this encounter: 84.6 kg.   DVT prophylaxis: Coumadin, supratherapeutic INR Code Status: Full code Family Communication: Wife updated over the phone Disposition Plan: Pain improvement of right hand swelling and pain.  Concern is that this might be an infection.  We are treating currently for gout.  Consultants:   Ortho   Procedures:  Renal ultrasound; Areas of bladder wall thickening, potentially related to trabeculation. 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  2. No hydronephrosis.   Antimicrobials:   Ceftriaxone   Subjective: He still complaining of right hand pain.  He is not able to extend his fingers.  His hand is a still red and edematous.   Objective: Vitals:   03/23/19 0413 03/23/19 0532  03/23/19 0533 03/23/19 0955  BP:  123/70 123/70   Pulse:  96    Resp:    14  Temp: 98.8 F (37.1 C)   98.8 F (37.1 C)  TempSrc: Oral   Oral  SpO2:  99%    Weight:      Height:        Intake/Output Summary (Last 24 hours) at 03/23/2019 1026 Last data filed at 03/23/2019 0530 Gross per 24 hour  Intake 100 ml  Output 800 ml  Net -700 ml   Filed Weights   03/18/19 2337 03/19/19 0418  Weight: 82.5 kg 84.6 kg    Examination:  General exam:NAD Respiratory system; CTA Cardiovascular system: S 1, S 2 IRR Gastrointestinal system: BS present, soft, nt. Central nervous system: Alert, follow command Extremities: Left knee incision healing.  Skin: Right hand tender to palpation, redness unable to extend fourth and fifth finger    Data Reviewed: I have personally reviewed following labs and imaging studies  CBC: Recent Labs  Lab 03/18/19 1819 03/19/19 0241 03/20/19 0328 03/21/19 0353 03/23/19 0311  WBC 22.5* 22.0* 13.6* 9.6 10.4  NEUTROABS 20.5*  --   --   --   --   HGB 12.5* 11.2* 9.7* 9.7* 9.4*  HCT 39.8 35.1* 31.1* 30.2* 29.1*  MCV 87.3 85.6 86.4 84.6 85.3  PLT 409* 374 350 349 032   Basic Metabolic Panel: Recent Labs  Lab 03/18/19 1819 03/19/19 0241 03/20/19 0328 03/21/19 0353 03/23/19 0311  NA 137 140 138 140 139  K 3.5 3.4* 3.3* 3.5 3.5  CL 105 107 111 110 111  CO2 20* 22 17* 20* 21*  GLUCOSE 134* 126* 113* 107* 119*  BUN 17 15 17 16 13   CREATININE 1.06 1.06 0.86 0.73 0.70  CALCIUM 8.8* 8.3* 8.2* 8.0* 7.9*  MG  --   --  1.7  --   --    GFR: Estimated Creatinine Clearance: 83.6 mL/min (by C-G formula based on SCr of 0.7 mg/dL). Liver Function Tests: Recent Labs  Lab 03/18/19 1819 03/19/19 0241  AST 17 15  ALT 40 29  ALKPHOS 113 102  BILITOT 1.4* 0.9  PROT 7.1 6.3*  ALBUMIN 2.6* 2.2*   No results for input(s): LIPASE, AMYLASE in the last 168 hours. No results for input(s): AMMONIA in the last 168 hours. Coagulation Profile: Recent Labs   Lab 03/19/19 0732 03/20/19 0328 03/21/19 0353 03/22/19 0258 03/23/19 0311  INR 4.4* 3.1* 2.3* 2.6* 2.6*   Cardiac Enzymes: No results for input(s): CKTOTAL, CKMB, CKMBINDEX, TROPONINI in the last 168 hours. BNP (last 3 results) No results for input(s): PROBNP in the last 8760 hours. HbA1C: No results for input(s): HGBA1C in the last 72 hours. CBG: No results for input(s): GLUCAP in the last 168 hours. Lipid Profile: No results for input(s): CHOL, HDL, LDLCALC, TRIG, CHOLHDL, LDLDIRECT in the last 72 hours. Thyroid Function Tests: No results for input(s): TSH, T4TOTAL, FREET4, T3FREE, THYROIDAB in the last 72 hours. Anemia Panel: No results for input(s): VITAMINB12, FOLATE, FERRITIN, TIBC, IRON, RETICCTPCT in the last 72 hours. Sepsis Labs: Recent Labs  Lab 03/18/19 1835 03/18/19 2339  LATICACIDVEN 1.4 1.1    Recent Results (  from the past 240 hour(s))  Blood culture (routine x 2)     Status: None (Preliminary result)   Collection Time: 03/18/19  6:56 PM  Result Value Ref Range Status   Specimen Description BLOOD RIGHT ANTECUBITAL  Final   Special Requests   Final    BOTTLES DRAWN AEROBIC AND ANAEROBIC Blood Culture adequate volume   Culture   Final    NO GROWTH 4 DAYS Performed at [facility], [address] [place], [place] [address]    Report Status PENDING  Incomplete  Blood culture (routine x 2)     Status: Abnormal   Collection Time: 03/18/19  7:05 PM  Result Value Ref Range Status   Specimen Description BLOOD BLOOD RIGHT FOREARM  Final   Special Requests   Final    BOTTLES DRAWN AEROBIC AND ANAEROBIC Blood Culture results may not be optimal due to an inadequate volume of blood received in culture bottles   Culture  Setup Time   Final    GRAM NEGATIVE RODS ANAEROBIC BOTTLE ONLY CRITICAL RESULT CALLED TO, READ BACK BY AND VERIFIED WITH: PHARMD R FANNING [id number] AT 1034 AM BY CM Performed at [facility], [place] [address] [place], [place] [address]     Culture ESCHERICHIA COLI (A)  Final   Report Status 03/21/2019 FINAL  Final   Organism ID, Bacteria ESCHERICHIA COLI  Final      Susceptibility   Escherichia coli - MIC*    AMPICILLIN >=32 RESISTANT Resistant     CEFAZOLIN <=4 SENSITIVE Sensitive     CEFEPIME <=1 SENSITIVE Sensitive     CEFTAZIDIME <=1 SENSITIVE Sensitive     CEFTRIAXONE <=1 SENSITIVE Sensitive     CIPROFLOXACIN >=4 RESISTANT Resistant     GENTAMICIN <=1 SENSITIVE Sensitive     IMIPENEM <=0.25 SENSITIVE Sensitive     TRIMETH/SULFA <=20 SENSITIVE Sensitive     AMPICILLIN/SULBACTAM >=32 RESISTANT Resistant     PIP/TAZO <=4 SENSITIVE Sensitive     Extended ESBL NEGATIVE Sensitive     * ESCHERICHIA COLI  Blood Culture ID Panel (Reflexed)     Status: Abnormal   Collection Time: 03/18/19  7:05 PM  Result Value Ref Range Status   Enterococcus species NOT DETECTED NOT DETECTED Final   Listeria monocytogenes NOT DETECTED NOT DETECTED Final   Staphylococcus species NOT DETECTED NOT DETECTED Final   Staphylococcus aureus (BCID) NOT DETECTED NOT DETECTED Final   Streptococcus species NOT DETECTED NOT DETECTED Final   Streptococcus agalactiae NOT DETECTED NOT DETECTED Final   Streptococcus pneumoniae NOT DETECTED NOT DETECTED Final   Streptococcus pyogenes NOT DETECTED NOT DETECTED Final   Acinetobacter baumannii NOT DETECTED NOT DETECTED Final   Enterobacteriaceae species DETECTED (A) NOT DETECTED Final    Comment: Enterobacteriaceae represent a large family of gram-negative bacteria, not a single organism. CRITICAL RESULT CALLED TO, READ BACK BY AND VERIFIED WITH: PHARMD R FANNING [id number] AT 1034 AM BY CM    Enterobacter cloacae complex NOT DETECTED NOT DETECTED Final   Escherichia coli DETECTED (A) NOT DETECTED Final    Comment: CRITICAL RESULT CALLED TO, READ BACK BY AND VERIFIED WITH: PHARMD R FANNING [id number] AT 1034 BY CM    Klebsiella oxytoca NOT DETECTED NOT DETECTED Final   Klebsiella pneumoniae NOT DETECTED NOT  DETECTED Final   Proteus species NOT DETECTED NOT DETECTED Final   Serratia marcescens NOT DETECTED NOT DETECTED Final   Carbapenem resistance NOT DETECTED NOT DETECTED Final   Haemophilus influenzae  NOT DETECTED NOT DETECTED Final   Neisseria meningitidis NOT DETECTED NOT DETECTED Final   Pseudomonas aeruginosa NOT DETECTED NOT DETECTED Final   Candida albicans NOT DETECTED NOT DETECTED Final   Candida glabrata NOT DETECTED NOT DETECTED Final   Candida krusei NOT DETECTED NOT DETECTED Final   Candida parapsilosis NOT DETECTED NOT DETECTED Final   Candida tropicalis NOT DETECTED NOT DETECTED Final    Comment: Performed at [facility], [place] [address], [place], [place] [address]  SARS Coronavirus 2 (CEPHEID - Performed in [place] [facility] lab), Hosp Order     Status: None   Collection Time: 03/18/19  7:28 PM  Result Value Ref Range Status   SARS Coronavirus 2 NEGATIVE NEGATIVE Final    Comment: (NOTE) If result is NEGATIVE SARS-CoV-2 target nucleic acids are NOT DETECTED. The SARS-CoV-2 RNA is generally detectable in upper and lower  respiratory specimens during the acute phase of infection. The lowest  concentration of SARS-CoV-2 viral copies this assay can detect is 250  copies / mL. A negative result does not preclude SARS-CoV-2 infection  and should not be used as the sole basis for treatment or other  patient management decisions.  A negative result may occur with  improper specimen collection / handling, submission of specimen other  than nasopharyngeal swab, presence of viral mutation(s) within the  areas targeted by this assay, and inadequate number of viral copies  (<250 copies / mL). A negative result must be combined with clinical  observations, patient history, and epidemiological information. If result is POSITIVE SARS-CoV-2 target nucleic acids are DETECTED. The SARS-CoV-2 RNA is generally detectable in upper and lower  respiratory specimens dur ing the acute  phase of infection.  Positive  results are indicative of active infection with SARS-CoV-2.  Clinical  correlation with patient history and other diagnostic information is  necessary to determine patient infection status.  Positive results do  not rule out bacterial infection or co-infection with other viruses. If result is PRESUMPTIVE POSTIVE SARS-CoV-2 nucleic acids MAY BE PRESENT.   A presumptive positive result was obtained on the submitted specimen  and confirmed on repeat testing.  While 2019 novel coronavirus  (SARS-CoV-2) nucleic acids may be present in the submitted sample  additional confirmatory testing may be necessary for epidemiological  and / or clinical management purposes  to differentiate between  SARS-CoV-2 and other Sarbecovirus currently known to infect humans.  If clinically indicated additional testing with an alternate test  methodology [id number]) is advised. The SARS-CoV-2 RNA is generally  detectable in upper and lower respiratory sp ecimens during the acute  phase of infection. The expected result is Negative. Fact Sheet for Patients:  StrictlyIdeas.no Fact Sheet for Healthcare Providers: BankingDealers.co.za This test is not yet approved or cleared by the [place] FDA and has been authorized for detection and/or diagnosis of SARS-CoV-2 by FDA under an Emergency Use Authorization (EUA).  This EUA will remain in effect (meaning this test can be used) for the duration of the COVID-19 declaration under Section 564(b)(1) of the Act, 21 U.S.C. section 360bbb-3(b)(1), unless the authorization is terminated or revoked sooner. Performed at [facility], [place] [address], [place], [place] [address]   Urine culture     Status: Abnormal   Collection Time: 03/18/19  9:16 PM  Result Value Ref Range Status   Specimen Description URINE, CLEAN CATCH  Final   Special Requests   Final    NONE Performed at [facility], [place] [address], [place], [place] [address]    Culture 50,000 COLONIES/mL ESCHERICHIA COLI (A)  Final   Report Status 03/20/2019 FINAL  Final   Organism ID, Bacteria ESCHERICHIA COLI (A)  Final      Susceptibility   Escherichia coli - MIC*    AMPICILLIN >=32 RESISTANT Resistant     CEFAZOLIN <=4 SENSITIVE Sensitive     CEFTRIAXONE <=1 SENSITIVE Sensitive     CIPROFLOXACIN >=4 RESISTANT Resistant     GENTAMICIN <=1 SENSITIVE Sensitive     IMIPENEM <=0.25 SENSITIVE Sensitive     NITROFURANTOIN <=16 SENSITIVE Sensitive     TRIMETH/SULFA <=20 SENSITIVE Sensitive     AMPICILLIN/SULBACTAM >=32 RESISTANT Resistant     PIP/TAZO <=4 SENSITIVE Sensitive     Extended ESBL NEGATIVE Sensitive     * 50,000 COLONIES/mL ESCHERICHIA COLI  MRSA PCR Screening     Status: None   Collection Time: 03/19/19  1:19 PM  Result Value Ref Range Status   MRSA by PCR NEGATIVE NEGATIVE Final    Comment:        The GeneXpert MRSA Assay (FDA approved for NASAL specimens only), is one component of a comprehensive MRSA colonization surveillance program. It is not intended to diagnose MRSA infection nor to guide or monitor treatment for MRSA infections. Performed at [facility], [place] [address], [place], [place] [address]   Culture, blood (routine x 2)     Status: None (Preliminary result)   Collection Time: 03/21/19  1:52 PM  Result Value Ref Range Status   Specimen Description BLOOD LEFT HAND  Final   Special Requests AEROBIC BOTTLE ONLY Blood Culture adequate volume  Final   Culture   Final    NO GROWTH < 24 HOURS Performed at [facility], [place] [address] [place], [place] [address]    Report Status PENDING  Incomplete  Culture, blood (routine x 2)     Status: None (Preliminary result)   Collection Time: 03/21/19  1:53 PM  Result Value Ref Range Status   Specimen Description BLOOD LEFT FOREARM  Final   Special Requests   Final    AEROBIC BOTTLE ONLY Blood Culture results may not  be optimal due to an inadequate volume of blood received in culture bottles   Culture   Final    NO GROWTH < 24 HOURS Performed at [facility], [place] [address], [place],  [address]    Report Status PENDING  Incomplete         Radiology Studies: Dg Chest Port 1 View  Result Date: 03/23/2019 CLINICAL DATA:  Hypoxemia.  Weakness. EXAM: PORTABLE CHEST 1 VIEW COMPARISON:  Chest x-rays 03/18/2019 and 02/08/2019 and abdominal CT scan dated 02/08/2019 FINDINGS: Heart size is normal. Aortic atherosclerosis. Pulmonary vascularity is at the upper limits of normal. Density behind the heart is demonstrated to be prominent mediastinal fat on the prior CT scan. No discrete infiltrates or effusions. No acute bony abnormality. Degenerative changes of both shoulders consistent with chronic full-thickness rotator cuff tears. IMPRESSION: No acute abnormalities. Aortic Atherosclerosis (ICD10-I70.0). Electronically Signed   By: Lorriane Shire M.D.   On: 03/23/2019 09:44        Scheduled Meds: . allopurinol  100 mg Oral Daily  . colchicine  0.6 mg Oral BID  . diltiazem  30 mg Oral Q6H  . donepezil  10 mg Oral Daily  . feeding supplement (ENSURE ENLIVE)  237 mL Oral BID BM  .  lithium carbonate  150 mg Oral QHS  . Warfarin - Pharmacist Dosing Inpatient   Does not apply q1800   Continuous Infusions: . sodium chloride 100 mL/hr at 03/23/19 0530  . cefTRIAXone (ROCEPHIN)  IV 2 g (03/22/19 1534)     LOS: 5 days    Time spent: 35 minutes    Elmarie Shiley, MD [facility] Pager [phone] If 7PM-7AM, please contact night-coverage [email or web address] Password [facility] 03/23/2019, 10:26 AM

## 2019-03-23 NOTE — Plan of Care (Signed)
  Problem: Education: Goal: Knowledge of General Education information will improve Description Including pain rating scale, medication(s)/side effects and non-pharmacologic comfort measures Outcome: Progressing   Problem: Health Behavior/Discharge Planning: Goal: Ability to manage health-related needs will improve Outcome: Progressing   

## 2019-03-23 NOTE — Progress Notes (Signed)
ANTICOAGULATION CONSULT NOTE - Follow Up Consult  Pharmacy Consult for Coumadin Indication: atrial fibrillation  No Known Allergies  Patient Measurements: Height: 5\' 10"  (177.8 cm) Weight: 186 lb 8.2 oz (84.6 kg) IBW/kg (Calculated) : 73  Vital Signs: Temp: 98.8 F (37.1 C) (05/12 0955) Temp Source: Oral (05/12 0955) BP: 123/70 (05/12 0533) Pulse Rate: 96 (05/12 0532)  Labs: Recent Labs    03/21/19 0353 03/22/19 0258 03/23/19 0311  HGB 9.7*  --  9.4*  HCT 30.2*  --  29.1*  PLT 349  --  314  LABPROT 25.1* 27.5* 27.6*  INR 2.3* 2.6* 2.6*  CREATININE 0.73  --  0.70    Estimated Creatinine Clearance: 83.6 mL/min (by C-G formula based on SCr of 0.7 mg/dL).   Medications:  Coumadin PTA regimen: 5mg  MWF; 2.5mg  TTSaSu  Assessment: 73 year old male admitted 5/7 with pyelonephritis and E coli bacteremia. He is on chronic anticoagulation with Coumadin for atrial fibrillation. His INR was supratherapeutic on admission, likely due to acute illness and recent Levaquin therapy.  INR now therapeutic at 2.6.  Will continue with home regimen.  Goal of Therapy:  INR 2-3 Monitor platelets by anticoagulation protocol: Yes   Plan:  Warfarin 5mg  x1 tonight Daily PT/INR  Monitor for bleeding  Cleotis Sparr A. Levada Dy, PharmD, [place] Please utilize Amion for appropriate phone number to reach the unit pharmacist ([place])    03/23/2019 10:29 AM

## 2019-03-24 LAB — PROTIME-INR
INR: 3.3 — ABNORMAL HIGH (ref 0.8–1.2)
Prothrombin Time: 33.2 seconds — ABNORMAL HIGH (ref 11.4–15.2)

## 2019-03-24 MED ORDER — METHYLPREDNISOLONE SODIUM SUCC 125 MG IJ SOLR
60.0000 mg | INTRAMUSCULAR | Status: AC
  Administered 2019-03-24: 12:00:00 60 mg via INTRAVENOUS
  Filled 2019-03-24: qty 2

## 2019-03-24 MED ORDER — WARFARIN SODIUM 2.5 MG PO TABS
2.5000 mg | ORAL_TABLET | Freq: Once | ORAL | Status: AC
  Administered 2019-03-24: 18:00:00 2.5 mg via ORAL
  Filled 2019-03-24: qty 1

## 2019-03-24 NOTE — Care Management (Signed)
Spoke with patient's wife over the phone. We discussed dispo plan. She is still wishing for patient to return home. CM emphasized that it will be more difficult to provide care at home as he has limitations in mobility to both upper and lower extremities on both his right and left side (Lknee and R hand/wrist). Discussed that CM witnessed him having difficulty in feeding himself, and he would assistance with all ADLs at home. We discussed her limitations with her knee strength and rotator cuff, she states that she would not be able to support him getting up and out of bed. Never the less, she still preferres him to return home at DC. She states that she is in the process of setting up [facility] for private duty aid services. CM encouraged her to schedule 24/7 support initially and assess their needs in the next few days after DC further. She could either cut back on private duty aid, or use utiliatize [facility] CSW to assist in SNF placement. She verbalized understanding of these options.  She states that she has hospital bed, sliding board, WC and 3/1 with drop arm for him at home. Prestigiacomo,[name] Spouse [phone]  [phone]

## 2019-03-24 NOTE — TOC Benefit Eligibility Note (Signed)
Transition of Care [facility]) Benefit Eligibility Note    Patient Details  Name: Russell Dillon MRN: [id number] Date of Birth: Dec 24, 1945   Medication/Dose: COLCHICINE  0.6 MG BID  Covered?: Yes  Tier: 3 Drug  Prescription Coverage Preferred Pharmacy: YES([facility])  Spoke with Person/Company/Phone Number:: [employer] # [phone])  Co-Pay: $47.00  Prior Approval: No          Memory Argue Phone Number: 03/24/2019, 12:22 PM

## 2019-03-24 NOTE — Progress Notes (Signed)
ANTICOAGULATION CONSULT NOTE - Follow Up Consult  Pharmacy Consult for Coumadin Indication: atrial fibrillation  No Known Allergies  Patient Measurements: Height: 5\' 10"  (177.8 cm) Weight: 186 lb 8.2 oz (84.6 kg) IBW/kg (Calculated) : 73  Vital Signs: Temp: 98 F (36.7 C) (05/13 0610) Temp Source: Oral (05/13 0610) BP: 117/81 (05/13 0610) Pulse Rate: 89 (05/13 0610)  Labs: Recent Labs    03/22/19 0258 03/23/19 0311 03/24/19 0236  HGB  --  9.4*  --   HCT  --  29.1*  --   PLT  --  314  --   LABPROT 27.5* 27.6* 33.2*  INR 2.6* 2.6* 3.3*  CREATININE  --  0.70  --     Estimated Creatinine Clearance: 83.6 mL/min (by C-G formula based on SCr of 0.7 mg/dL).   Medications:  Coumadin PTA regimen: 5mg  MWF; 2.5mg  TTSaSu  Assessment: 73 year old male admitted 5/7 with pyelonephritis and E coli bacteremia. He is on chronic anticoagulation with Coumadin for atrial fibrillation. His INR was supratherapeutic on admission, likely due to acute illness and recent Levaquin therapy.  INR now slightly SUPRAtherapeutic at 3.3.  Will  give decreased dose.   Goal of Therapy:  INR 2-3 Monitor platelets by anticoagulation protocol: Yes   Plan:  Warfarin 2.5mg  x1 tonight Daily PT/INR  Monitor for bleeding  Brettney Ficken A. Levada Dy, PharmD, [place] Please utilize Amion for appropriate phone number to reach the unit pharmacist ([place])    03/24/2019 10:30 AM

## 2019-03-24 NOTE — Progress Notes (Signed)
PROGRESS NOTE    Russell Dillon  EUM:[id number] DOB: [birth year] DOA: 03/18/2019 PCP: Marton Redwood, MD    Brief Narrative: 73 year old with past medical history significant for chronic A. fib on Coumadin, hyperlipidemia, hypertension recent patellar tendon rupture status post surgery 3 weeks ago, dementia and bipolar who came to the ED with confusion, hematuria and fever.  Patient was recently treated orally for UTI by his primary care doctor with Levaquin.   He  was  admitted with sepsis from urinary tract infection.  Due to recent knee surgery orthopedic was consulted.  They evaluated the patient and they do not think that patient's knee is infected. Patient 1 out of 2 blood cultures is growing E. Coli.  Repeated blood culture has remained negative.  He will need a total of 2 weeks of antibiotics for bacteremia.  Patient on 5/11 developed right Hein edema and redness.  Hand surgeon was consulted is recommending treatment for acute gout.  Need to monitor closely for development of infection.   Subjective: Patient in bed, remains pleasantly confused but in no distress, denies any headache chest or abdominal pain, says right hand discomfort is improved.   Assessment & Plan:    1-Sepsis secondary to urinary tract infection: e coli bacteremia  Continue with IV fluids, IV ceftriaxone. Cultures positive for E. coli.  Responding very well to Rocephin.  Subsequent follow-up cultures drawn on 03/22/2019 have been negative.  Renal ultrasound showed no hydronephrosis or pyelonephritis however no diverticular enlargement of the bladder, per urology outpatient follow-up, for now continue Rocephin, will try and switch to oral Keflex upon discharge to complete a total of 10-day course.   2-Right had, swelling 4th and 5th finger contraction, unable to extended, mild redness knuckle.  Seen by hand surgeon Dr. Grandville Silos who thinks this is likely gout, placed on colchicine twice daily on 03/23/2019.  Mild  clinical improvement, will give a challenge of IV Solu-Medrol 1 dose on 03/24/2019 and reevaluate tomorrow.    3-A. fib with RVR:  Coumadin per pharmacy to dose.  Tolerating Cardizem 60 mg every 6 hours.  Home dose is higher.  4-Dementia: resume lithium.   5-Acute metabolic encephalopathy: Likely related to acute infection and sepsis. Improving. Gets confuse and irritated at times.   6-hyperlipidemia; per wife patient was taking off of his statin.  7-hypokalemia replete orally. Replete mg.  8-recent Left knee Sx, tendon rupture repair. orthopedic informed of admission.  No evidence of infections.   9-Metabolic acidosis; IV fluids, IV bolus.   10-A fib; Remune Cardizem low dose, but holding some doses due to soft BP.  11-Anemia; hb today at 9. On admission at 49. pior hb at 9. Suspect hb on admission due to hemoconcentration.  Hb has remain stable.   Estimated body mass index is 26.76 kg/m as calculated from the following:   Height as of this encounter: 5\' 10"  (1.778 m).   Weight as of this encounter: 84.6 kg.   DVT prophylaxis: Coumadin  Code Status: Full code Family Communication: Wife updated over the phone by previous MD Disposition Plan: Pain improvement of right hand swelling and pain.  Concern is that this might be an infection.  We are treating currently for gout.  Consultants:   Ortho   Procedures:  Renal ultrasound;   Areas of bladder wall thickening, potentially related to trabeculation. 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  2. No hydronephrosis.   Antimicrobials:  Ceftriaxone     Objective: Vitals:   03/23/19 1646 03/23/19 2152 03/24/19 0610 03/24/19 1130  BP: 123/87 103/65 117/81 110/72  Pulse: 100 (!) 114 89   Resp:      Temp:  99.5 F (37.5 C) 98 F (36.7 C)   TempSrc:  Oral Oral   SpO2:  98% 98%   Weight:      Height:         Intake/Output Summary (Last 24 hours) at 03/24/2019 1140 Last data filed at 03/24/2019 0830 Gross per 24 hour  Intake 2655.48 ml  Output 1000 ml  Net 1655.48 ml   Filed Weights   03/18/19 2337 03/19/19 0418  Weight: 82.5 kg 84.6 kg    Examination:  Awake, pleasantly confused, No new F.N deficits, Normal affect [place].AT,PERRAL Supple Neck,No JVD, No cervical lymphadenopathy appriciated.  Symmetrical Chest wall movement, Good air movement bilaterally, CTAB RRR,No Gallops, Rubs or new Murmurs, No Parasternal Heave +ve B.Sounds, Abd Soft, No tenderness, No organomegaly appriciated, No rebound - guarding or rigidity. No Cyanosis, Clubbing or edema, R 4th & 5th MCP redness, L knee in brace   Data Reviewed: I have personally reviewed following labs and imaging studies  CBC: Recent Labs  Lab 03/18/19 1819 03/19/19 0241 03/20/19 0328 03/21/19 0353 03/23/19 0311  WBC 22.5* 22.0* 13.6* 9.6 10.4  NEUTROABS 20.5*  --   --   --   --   HGB 12.5* 11.2* 9.7* 9.7* 9.4*  HCT 39.8 35.1* 31.1* 30.2* 29.1*  MCV 87.3 85.6 86.4 84.6 85.3  PLT 409* 374 350 349 701   Basic Metabolic Panel: Recent Labs  Lab 03/18/19 1819 03/19/19 0241 03/20/19 0328 03/21/19 0353 03/23/19 0311  NA 137 140 138 140 139  K 3.5 3.4* 3.3* 3.5 3.5  CL 105 107 111 110 111  CO2 20* 22 17* 20* 21*  GLUCOSE 134* 126* 113* 107* 119*  BUN 17 15 17 16 13   CREATININE 1.06 1.06 0.86 0.73 0.70  CALCIUM 8.8* 8.3* 8.2* 8.0* 7.9*  MG  --   --  1.7  --   --    GFR: Estimated Creatinine Clearance: 83.6 mL/min (by C-G formula based on SCr of 0.7 mg/dL). Liver Function Tests: Recent Labs  Lab 03/18/19 1819 03/19/19 0241  AST 17 15  ALT 40 29  ALKPHOS 113 102  BILITOT 1.4* 0.9  PROT 7.1 6.3*  ALBUMIN 2.6* 2.2*   No results for input(s): LIPASE, AMYLASE in the last 168 hours. No results for input(s): AMMONIA in the last 168 hours. Coagulation Profile: Recent Labs  Lab 03/20/19 0328 03/21/19 0353 03/22/19 0258  03/23/19 0311 03/24/19 0236  INR 3.1* 2.3* 2.6* 2.6* 3.3*   Cardiac Enzymes: No results for input(s): CKTOTAL, CKMB, CKMBINDEX, TROPONINI in the last 168 hours. BNP (last 3 results) No results for input(s): PROBNP in the last 8760 hours. HbA1C: No results for input(s): HGBA1C in the last 72 hours. CBG: No results for input(s): GLUCAP in the last 168 hours. Lipid Profile: No results for input(s): CHOL, HDL, LDLCALC, TRIG, CHOLHDL, LDLDIRECT in the last 72 hours. Thyroid Function Tests: No results for input(s): TSH, T4TOTAL, FREET4, T3FREE, THYROIDAB in the last 72 hours. Anemia Panel: No results for input(s): VITAMINB12, FOLATE, FERRITIN, TIBC, IRON, RETICCTPCT in the last 72 hours. Sepsis Labs: Recent Labs  Lab 03/18/19 1835 03/18/19 2339  LATICACIDVEN 1.4 1.1    Recent Results (from the past 240 hour(s))  Blood culture (routine x 2)     Status:  None   Collection Time: 03/18/19  6:56 PM  Result Value Ref Range Status   Specimen Description BLOOD RIGHT ANTECUBITAL  Final   Special Requests   Final    BOTTLES DRAWN AEROBIC AND ANAEROBIC Blood Culture adequate volume   Culture   Final    NO GROWTH 5 DAYS Performed at [facility], [address], [place], [place] [address]    Report Status 03/23/2019 FINAL  Final  Blood culture (routine x 2)     Status: Abnormal   Collection Time: 03/18/19  7:05 PM  Result Value Ref Range Status   Specimen Description BLOOD BLOOD RIGHT FOREARM  Final   Special Requests   Final    BOTTLES DRAWN AEROBIC AND ANAEROBIC Blood Culture results may not be optimal due to an inadequate volume of blood received in culture bottles   Culture  Setup Time   Final    GRAM NEGATIVE RODS ANAEROBIC BOTTLE ONLY CRITICAL RESULT CALLED TO, READ BACK BY AND VERIFIED WITH: PHARMD R FANNING [id number] AT 1034 AM BY CM Performed at [facility], [place] [address], [place], [place] [address]    Culture ESCHERICHIA COLI (A)  Final   Report Status 03/21/2019  FINAL  Final   Organism ID, Bacteria ESCHERICHIA COLI  Final      Susceptibility   Escherichia coli - MIC*    AMPICILLIN >=32 RESISTANT Resistant     CEFAZOLIN <=4 SENSITIVE Sensitive     CEFEPIME <=1 SENSITIVE Sensitive     CEFTAZIDIME <=1 SENSITIVE Sensitive     CEFTRIAXONE <=1 SENSITIVE Sensitive     CIPROFLOXACIN >=4 RESISTANT Resistant     GENTAMICIN <=1 SENSITIVE Sensitive     IMIPENEM <=0.25 SENSITIVE Sensitive     TRIMETH/SULFA <=20 SENSITIVE Sensitive     AMPICILLIN/SULBACTAM >=32 RESISTANT Resistant     PIP/TAZO <=4 SENSITIVE Sensitive     Extended ESBL NEGATIVE Sensitive     * ESCHERICHIA COLI  Blood Culture ID Panel (Reflexed)     Status: Abnormal   Collection Time: 03/18/19  7:05 PM  Result Value Ref Range Status   Enterococcus species NOT DETECTED NOT DETECTED Final   Listeria monocytogenes NOT DETECTED NOT DETECTED Final   Staphylococcus species NOT DETECTED NOT DETECTED Final   Staphylococcus aureus (BCID) NOT DETECTED NOT DETECTED Final   Streptococcus species NOT DETECTED NOT DETECTED Final   Streptococcus agalactiae NOT DETECTED NOT DETECTED Final   Streptococcus pneumoniae NOT DETECTED NOT DETECTED Final   Streptococcus pyogenes NOT DETECTED NOT DETECTED Final   Acinetobacter baumannii NOT DETECTED NOT DETECTED Final   Enterobacteriaceae species DETECTED (A) NOT DETECTED Final    Comment: Enterobacteriaceae represent a large family of gram-negative bacteria, not a single organism. CRITICAL RESULT CALLED TO, READ BACK BY AND VERIFIED WITH: PHARMD R FANNING [id number] AT 1034 AM BY CM    Enterobacter cloacae complex NOT DETECTED NOT DETECTED Final   Escherichia coli DETECTED (A) NOT DETECTED Final    Comment: CRITICAL RESULT CALLED TO, READ BACK BY AND VERIFIED WITH: PHARMD R FANNING [id number] AT 1034 BY CM    Klebsiella oxytoca NOT DETECTED NOT DETECTED Final   Klebsiella pneumoniae NOT DETECTED NOT DETECTED Final   Proteus species NOT DETECTED NOT DETECTED Final    Serratia marcescens NOT DETECTED NOT DETECTED Final   Carbapenem resistance NOT DETECTED NOT DETECTED Final   Haemophilus influenzae NOT DETECTED NOT DETECTED Final   Neisseria meningitidis NOT DETECTED NOT DETECTED Final   Pseudomonas  aeruginosa NOT DETECTED NOT DETECTED Final   Candida albicans NOT DETECTED NOT DETECTED Final   Candida glabrata NOT DETECTED NOT DETECTED Final   Candida krusei NOT DETECTED NOT DETECTED Final   Candida parapsilosis NOT DETECTED NOT DETECTED Final   Candida tropicalis NOT DETECTED NOT DETECTED Final    Comment: Performed at [facility], [place] [address], [place], [place] [address]  SARS Coronavirus 2 (CEPHEID - Performed in [facility] hospital lab), Hosp Order     Status: None   Collection Time: 03/18/19  7:28 PM  Result Value Ref Range Status   SARS Coronavirus 2 NEGATIVE NEGATIVE Final    Comment: (NOTE) If result is NEGATIVE SARS-CoV-2 target nucleic acids are NOT DETECTED. The SARS-CoV-2 RNA is generally detectable in upper and lower  respiratory specimens during the acute phase of infection. The lowest  concentration of SARS-CoV-2 viral copies this assay can detect is 250  copies / mL. A negative result does not preclude SARS-CoV-2 infection  and should not be used as the sole basis for treatment or other  patient management decisions.  A negative result may occur with  improper specimen collection / handling, submission of specimen other  than nasopharyngeal swab, presence of viral mutation(s) within the  areas targeted by this assay, and inadequate number of viral copies  (<250 copies / mL). A negative result must be combined with clinical  observations, patient history, and epidemiological information. If result is POSITIVE SARS-CoV-2 target nucleic acids are DETECTED. The SARS-CoV-2 RNA is generally detectable in upper and lower  respiratory specimens dur ing the acute phase of infection.  Positive  results are indicative of active  infection with SARS-CoV-2.  Clinical  correlation with patient history and other diagnostic information is  necessary to determine patient infection status.  Positive results do  not rule out bacterial infection or co-infection with other viruses. If result is PRESUMPTIVE POSTIVE SARS-CoV-2 nucleic acids MAY BE PRESENT.   A presumptive positive result was obtained on the submitted specimen  and confirmed on repeat testing.  While 2019 novel coronavirus  (SARS-CoV-2) nucleic acids may be present in the submitted sample  additional confirmatory testing may be necessary for epidemiological  and / or clinical management purposes  to differentiate between  SARS-CoV-2 and other Sarbecovirus currently known to infect humans.  If clinically indicated additional testing with an alternate test  methodology [phone]) is advised. The SARS-CoV-2 RNA is generally  detectable in upper and lower respiratory sp ecimens during the acute  phase of infection. The expected result is Negative. Fact Sheet for Patients:  StrictlyIdeas.no Fact Sheet for Healthcare Providers: BankingDealers.co.za This test is not yet approved or cleared by the [place] FDA and has been authorized for detection and/or diagnosis of SARS-CoV-2 by FDA under an Emergency Use Authorization (EUA).  This EUA will remain in effect (meaning this test can be used) for the duration of the COVID-19 declaration under Section 564(b)(1) of the Act, 21 U.S.C. section 360bbb-3(b)(1), unless the authorization is terminated or revoked sooner. Performed at [facility], [place] [address], [place], [place] [address]   Urine culture     Status: Abnormal   Collection Time: 03/18/19  9:16 PM  Result Value Ref Range Status   Specimen Description URINE, CLEAN CATCH  Final   Special Requests   Final    NONE Performed at [facility], [place] [address], [place], [place] [address]    Culture  50,000 COLONIES/mL ESCHERICHIA COLI (  A)  Final   Report Status 03/20/2019 FINAL  Final   Organism ID, Bacteria ESCHERICHIA COLI (A)  Final      Susceptibility   Escherichia coli - MIC*    AMPICILLIN >=32 RESISTANT Resistant     CEFAZOLIN <=4 SENSITIVE Sensitive     CEFTRIAXONE <=1 SENSITIVE Sensitive     CIPROFLOXACIN >=4 RESISTANT Resistant     GENTAMICIN <=1 SENSITIVE Sensitive     IMIPENEM <=0.25 SENSITIVE Sensitive     NITROFURANTOIN <=16 SENSITIVE Sensitive     TRIMETH/SULFA <=20 SENSITIVE Sensitive     AMPICILLIN/SULBACTAM >=32 RESISTANT Resistant     PIP/TAZO <=4 SENSITIVE Sensitive     Extended ESBL NEGATIVE Sensitive     * 50,000 COLONIES/mL ESCHERICHIA COLI  MRSA PCR Screening     Status: None   Collection Time: 03/19/19  1:19 PM  Result Value Ref Range Status   MRSA by PCR NEGATIVE NEGATIVE Final    Comment:        The GeneXpert MRSA Assay (FDA approved for NASAL specimens only), is one component of a comprehensive MRSA colonization surveillance program. It is not intended to diagnose MRSA infection nor to guide or monitor treatment for MRSA infections. Performed at [facility], [place] [address] [place], [place] [address]   Culture, blood (routine x 2)     Status: None (Preliminary result)   Collection Time: 03/21/19  1:52 PM  Result Value Ref Range Status   Specimen Description BLOOD LEFT HAND  Final   Special Requests AEROBIC BOTTLE ONLY Blood Culture adequate volume  Final   Culture   Final    NO GROWTH 2 DAYS Performed at [facility], [place] [address], [place], [place] [address]    Report Status PENDING  Incomplete  Culture, blood (routine x 2)     Status: None (Preliminary result)   Collection Time: 03/21/19  1:53 PM  Result Value Ref Range Status   Specimen Description BLOOD LEFT FOREARM  Final   Special Requests   Final    AEROBIC BOTTLE ONLY Blood Culture results may not be optimal due to an inadequate volume of blood received in culture  bottles   Culture   Final    NO GROWTH 2 DAYS Performed at [facility], [place] [address] [place], [place] [address]    Report Status PENDING  Incomplete     Radiology Studies: Dg Chest Port 1 View  Result Date: 03/23/2019 CLINICAL DATA:  Hypoxemia.  Weakness. EXAM: PORTABLE CHEST 1 VIEW COMPARISON:  Chest x-rays 03/18/2019 and 02/08/2019 and abdominal CT scan dated 02/08/2019 FINDINGS: Heart size is normal. Aortic atherosclerosis. Pulmonary vascularity is at the upper limits of normal. Density behind the heart is demonstrated to be prominent mediastinal fat on the prior CT scan. No discrete infiltrates or effusions. No acute bony abnormality. Degenerative changes of both shoulders consistent with chronic full-thickness rotator cuff tears. IMPRESSION: No acute abnormalities. Aortic Atherosclerosis (ICD10-I70.0). Electronically Signed   By: Lorriane Shire M.D.   On: 03/23/2019 09:44   Scheduled Meds: . allopurinol  100 mg Oral Daily  . colchicine  0.6 mg Oral BID  . diltiazem  30 mg Oral Q6H  . donepezil  10 mg Oral Daily  . feeding supplement (ENSURE ENLIVE)  237 mL Oral BID BM  . lithium carbonate  150 mg Oral QHS  . warfarin  2.5 mg Oral ONCE-1800  . Warfarin - Pharmacist Dosing Inpatient   Does not apply [phone]  Continuous Infusions: . sodium chloride 100 mL/hr at 03/24/19 0338  .  ceFAZolin (ANCEF) IV 2 g (03/24/19 0602)     LOS: 6 days    Time spent: 35 minutes  Signature  Lala Lund M.D on 03/24/2019 at 11:40 AM   -  To page go to [email or web address]

## 2019-03-25 DIAGNOSIS — G9341 Metabolic encephalopathy: Secondary | ICD-10-CM

## 2019-03-25 DIAGNOSIS — I4891 Unspecified atrial fibrillation: Secondary | ICD-10-CM

## 2019-03-25 DIAGNOSIS — N3001 Acute cystitis with hematuria: Secondary | ICD-10-CM

## 2019-03-25 DIAGNOSIS — Z7901 Long term (current) use of anticoagulants: Secondary | ICD-10-CM

## 2019-03-25 DIAGNOSIS — R7881 Bacteremia: Secondary | ICD-10-CM

## 2019-03-25 LAB — PROTIME-INR
INR: 3.1 — ABNORMAL HIGH (ref 0.8–1.2)
Prothrombin Time: 31.3 seconds — ABNORMAL HIGH (ref 11.4–15.2)

## 2019-03-25 LAB — CBC
HCT: 32.1 % — ABNORMAL LOW (ref 39.0–52.0)
Hemoglobin: 10.1 g/dL — ABNORMAL LOW (ref 13.0–17.0)
MCH: 26.7 pg (ref 26.0–34.0)
MCHC: 31.5 g/dL (ref 30.0–36.0)
MCV: 84.9 fL (ref 80.0–100.0)
Platelets: 401 10*3/uL — ABNORMAL HIGH (ref 150–400)
RBC: 3.78 MIL/uL — ABNORMAL LOW (ref 4.22–5.81)
RDW: 18.1 % — ABNORMAL HIGH (ref 11.5–15.5)
WBC: 10.8 10*3/uL — ABNORMAL HIGH (ref 4.0–10.5)
nRBC: 0 % (ref 0.0–0.2)

## 2019-03-25 LAB — URIC ACID: Uric Acid, Serum: 3.5 mg/dL — ABNORMAL LOW (ref 3.7–8.6)

## 2019-03-25 LAB — BASIC METABOLIC PANEL
Anion gap: 11 (ref 5–15)
BUN: 14 mg/dL (ref 8–23)
CO2: 21 mmol/L — ABNORMAL LOW (ref 22–32)
Calcium: 8.4 mg/dL — ABNORMAL LOW (ref 8.9–10.3)
Chloride: 105 mmol/L (ref 98–111)
Creatinine, Ser: 0.76 mg/dL (ref 0.61–1.24)
Glucose, Bld: 174 mg/dL — ABNORMAL HIGH (ref 70–99)
Potassium: 3.6 mmol/L (ref 3.5–5.1)
Sodium: 137 mmol/L (ref 135–145)

## 2019-03-25 LAB — MAGNESIUM: Magnesium: 1.9 mg/dL (ref 1.7–2.4)

## 2019-03-25 MED ORDER — COLCHICINE 0.6 MG PO TABS: 0.6000 mg | ORAL_TABLET | Freq: Two times a day (BID) | ORAL | 0 refills | Status: AC

## 2019-03-25 MED ORDER — COLCHICINE 0.6 MG PO TABS: 0.6000 mg | ORAL_TABLET | Freq: Two times a day (BID) | ORAL | 1 refills | Status: DC

## 2019-03-25 MED ORDER — CEPHALEXIN 500 MG PO CAPS: 500.0000 mg | ORAL_CAPSULE | Freq: Four times a day (QID) | ORAL | 0 refills | Status: DC

## 2019-03-25 MED ORDER — CEPHALEXIN 500 MG PO CAPS: 500.0000 mg | ORAL_CAPSULE | Freq: Four times a day (QID) | ORAL | 0 refills | Status: AC

## 2019-03-25 MED ORDER — ALLOPURINOL 100 MG PO TABS: 100.0000 mg | ORAL_TABLET | Freq: Every day | ORAL | 1 refills | Status: AC

## 2019-03-25 MED ORDER — WARFARIN SODIUM 2.5 MG PO TABS
2.5000 mg | ORAL_TABLET | Freq: Once | ORAL | Status: AC
  Administered 2019-03-25: 17:00:00 2.5 mg via ORAL
  Filled 2019-03-25: qty 1

## 2019-03-25 MED ORDER — DILTIAZEM HCL ER COATED BEADS 120 MG PO CP24: 120.0000 mg | ORAL_CAPSULE | Freq: Every day | ORAL | 11 refills | Status: AC

## 2019-03-25 NOTE — Plan of Care (Signed)
  Problem: Clinical Measurements: Goal: Ability to maintain clinical measurements within normal limits will improve Outcome: Progressing   Problem: Nutrition: Goal: Adequate nutrition will be maintained Outcome: Progressing   Problem: Coping: Goal: Level of anxiety will decrease Outcome: Progressing   Problem: Pain Managment: Goal: General experience of comfort will improve Outcome: Progressing   Problem: Safety: Goal: Ability to remain free from injury will improve Outcome: Progressing   Problem: Skin Integrity: Goal: Risk for impaired skin integrity will decrease Outcome: Progressing   Problem: Education: Goal: Knowledge of General Education information will improve Description Including pain rating scale, medication(s)/side effects and non-pharmacologic comfort measures Outcome: Not Progressing   Problem: Health Behavior/Discharge Planning: Goal: Ability to manage health-related needs will improve Outcome: Not Progressing   Problem: Activity: Goal: Risk for activity intolerance will decrease Outcome: Not Progressing   Problem: Elimination: Goal: Will not experience complications related to bowel motility Outcome: Not Progressing Goal: Will not experience complications related to urinary retention Outcome: Not Progressing

## 2019-03-25 NOTE — Discharge Summary (Addendum)
Physician Discharge Summary  Russell Dillon BWG:[id number] DOB: 1946-02-28 DOA: 03/18/2019  PCP: Marton Redwood, MD  Admit date: 03/18/2019 Discharge date: 03/25/2019  Time spent: 45 minutes  Recommendations for Outpatient Follow-up:  1. Follow up with PCP 1-2 weeks for evaluation of UTI, BP control, HR. Recommend bmet to evaluation electrolytes 2. Home health PT, RN, aid 3. Usual schedule for INR check 4. Follow up with Urology 2-3 weeks 5. Monitor right hand for gout   Discharge Diagnoses:  Principal Problem:   Sepsis, Gram negative ([place]) Active Problems:   GERD   Atrial fibrillation, chronic   Long term (current) use of anticoagulants   HTN (hypertension)   Hyperlipidemia   Dementia without behavioral disturbance (HCC)   Leukocytosis   Atrial fibrillation [place])   Discharge Condition: stable  Diet recommendation: heart healthy  Filed Weights   03/18/19 2337 03/19/19 0418  Weight: 82.5 kg 84.6 kg    History of present illness:  73 year old with past medical history significant for chronic A. fib on Coumadin, hyperlipidemia, hypertension recent patellar tendon rupture status post surgery 3 weeks prior to admission, dementia and bipolar who came to the ED 5/7 with confusion, hematuria and fever.  Patient recently treated orally for UTI by his primary care doctor with Levaquin.   He  was  admitted with sepsis from urinary tract infection.  Due to recent knee surgery orthopedic was consulted.  They evaluated the patient and did not think that patient's knee wais infected. Patient 1 out of 2 blood cultures grew E. Coli.  Repeated blood culture remained negative.  He will need a total of 2 weeks of antibiotics for bacteremia at discharge.  Patient on 5/11 developed right hand edema and redness.  Hand surgeon was consulted and recommended treatment for acute gout.  Marland Kitchen   Hospital Course:  1-Sepsis secondary to urinary tract infection: e coli bacteremia  Recieved IV fluids and  IV ceftriaxone. Cultures positive for E. coli. Subsequent follow-up cultures drawn on 03/22/2019  negative.  Renal ultrasound showed no hydronephrosis or pyelonephritis however no diverticular enlargement of the bladder, per urology outpatient follow-up. At discharge  Keflex  to complete a total of 10-day course.  2-Right had, swelling 4th and 5th finger contraction. Improving at discharge. Less erythema and swelling. Seen by hand surgeon Dr. Grandville Silos who opined likely gout, placed on colchicine twice daily on 03/23/2019.  Also given  IV Solu-Medrol 1 dose on 03/24/2019.    3-A. fib with RVR: rate controlled. Tolerated Cardizem 60 mg every 6 hours inpatient. Will decrease home dose to 120mg  daily as BP low end of normal. .  4-Dementia: stable at baseline. Resuming home med at discharge   5-Acute metabolic encephalopathy: Likely related to acute infection and sepsis. Back to baseline at discharge. .   6-hyperlipidemia; per wife patient was taken off of his statin.  7-hypokalemia repleted orally. Repleted mg. Low end of normal at discharge  8-recent Left knee Sx, tendon rupture repair. No evidence of infections. OP follow up  9-Metabolic acidosis; resolved with  IV fluids.   10-A fib; Remune Cardizem lower dose.  11-Anemia; hb today at 10.1 at discharge. On admission at 35. pior hb at 9. Suspect hb on admission due to hemoconcentration.    Procedures: Renal US: Areas of bladder wall thickening, potentially related to trabeculation. 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  2. No hydronephrosis  Consultations:  ortho  Discharge Exam:  Vitals:   03/25/19 0602 03/25/19 0901  BP: 98/62 93/61  Pulse: (!) 104   Resp:    Temp: 97.8 F (36.6 C)   SpO2: 97%     General: awake alert oriented to self and place Cardiovascular: irregularly irregular no mgr no le edema Respiratory:  normal effort BS somewhat distant but clear no wheeze  Discharge Instructions   Discharge Instructions    Diet - low sodium heart healthy   Complete by:  As directed    Diet - low sodium heart healthy   Complete by:  As directed    Discharge instructions   Complete by:  As directed    Follow up with PCP 1-2 weeks for evaluation of urinary tract infection, blood pressure and heart rate.  Home health PT, RN, aid.   Increase activity slowly   Complete by:  As directed    Increase activity slowly   Complete by:  As directed      Allergies as of 03/25/2019   No Known Allergies     Medication List    STOP taking these medications   levofloxacin 500 MG tablet Commonly known as:  LEVAQUIN   NAC 600 PO     TAKE these medications   acetaminophen 325 MG tablet Commonly known as:  TYLENOL Take 2 tablets (650 mg total) by mouth every 6 (six) hours as needed for mild pain, fever or headache.   allopurinol 100 MG tablet Commonly known as:  ZYLOPRIM Take 1 tablet (100 mg total) by mouth daily. Start taking on:  Mar 26, 2019   cephALEXin 500 MG capsule Commonly known as:  KEFLEX Take 1 capsule (500 mg total) by mouth 4 (four) times daily for 3 days.   colchicine 0.6 MG tablet Take 1 tablet (0.6 mg total) by mouth 2 (two) times daily.   diltiazem 120 MG 24 hr capsule Commonly known as:  Cardizem CD Take 1 capsule (120 mg total) by mouth daily. What changed:    medication strength  how much to take   docusate sodium 100 MG capsule Commonly known as:  COLACE Take 1 capsule (100 mg total) by mouth 2 (two) times daily. What changed:    when to take this  reasons to take this   donepezil 10 MG tablet Commonly known as:  ARICEPT Take 1 tablet (10 mg total) by mouth daily. What changed:  when to take this   feeding supplement (ENSURE ENLIVE) Liqd Take 237 mLs by mouth 2 (two) times daily between meals.   finasteride 5 MG tablet Commonly known as:  PROSCAR Take 1  tablet (5 mg total) by mouth every morning.   HYDROcodone-acetaminophen 5-325 MG tablet Commonly known as:  NORCO/VICODIN Take 1-2 tablets by mouth every 4 (four) hours as needed for moderate pain (pain score 4-6).   ICAPS AREDS 2 PO Take 1 tablet by mouth daily.   lithium carbonate 150 MG capsule Take 1 capsule (150 mg total) by mouth at bedtime.   OMEGA-3 FISH OIL PO Take 1 tablet by mouth at bedtime.   VITAMIN B-12 IJ Inject 1 Imperial Gallon as directed.   VITAMIN D3 PO Take 1,000 Units by mouth 2 (two) times daily.   warfarin 2.5 MG tablet Commonly known as:  Coumadin Take as directed. If you are unsure how to take this medication, talk to your nurse or doctor. Original instructions:  Take 1 tablet (2.5 mg total) by mouth daily. What changed:    how much to take  when to take this  additional instructions            Durable Medical Equipment  (From admission, onward)         Start     Ordered   03/22/19 1340  For home use only DME Other see comment  Once    Comments:  Condom cath, patient suffer from urinary incontinence and debility, weakness. Condom cath to be use at night   03/22/19 1340         No Known Allergies Follow-up Information    Marton Redwood, MD Follow up.   Specialty:  Internal Medicine Contact information: [place] [address] [phone]        Altamese Lodi, MD Follow up.   Specialty:  Orthopedic Surgery Why:  keep appointment on [place] information: [address] [place] [address] [place], [place] Follow up.   Why:  [facility] [phone] Contact information: [place] [address] [phone]            The results of significant diagnostics from this hospitalization (including imaging, microbiology, ancillary and laboratory) are listed below for reference.    Significant Diagnostic Studies: Ct  Head Wo Contrast  Result Date: 03/18/2019 CLINICAL DATA:  Altered level of consciousness, confusion. EXAM: CT HEAD WITHOUT CONTRAST TECHNIQUE: Contiguous axial images were obtained from the base of the skull through the vertex without intravenous contrast. COMPARISON:  02/08/2019 FINDINGS: Brain: There is atrophy and chronic small vessel disease changes. No acute intracranial abnormality. Specifically, no hemorrhage, hydrocephalus, mass lesion, acute infarction, or significant intracranial injury. Vascular: No hyperdense vessel or unexpected calcification. Skull: No acute calvarial abnormality. Sinuses/Orbits: Visualized paranasal sinuses and mastoids clear. Orbital soft tissues unremarkable. Other: None IMPRESSION: Atrophy, chronic microvascular disease. No acute intracranial abnormality. Electronically Signed   By: Rolm Baptise M.D.   On: 03/18/2019 22:22   US Renal  Result Date: 03/19/2019 CLINICAL DATA:  Urinary tract infection EXAM: RENAL / URINARY TRACT ULTRASOUND COMPLETE COMPARISON:  CT scan 02/08/2019 FINDINGS: Right Kidney: Renal measurements: 11.0 x 6.2 x 5.6 cm = volume: 201 ML. Cortical thinning without hydronephrosis. Parenchymal echotexture appears increased. Left Kidney: Renal measurements: 11.2 x 7.4 x 7.3 cm = volume: 318 mL. Poorly visualized but echotexture may be increased. No hydronephrosis evident. Bladder: Appears normal for degree of bladder distention. Areas of apparent focal bladder wall thickening may be related to trabeculation. Note: Prostate gland is markedly enlarged. IMPRESSION: 1. Areas of bladder wall thickening, potentially related to trabeculation. 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 2. No hydronephrosis. Electronically Signed   By: Misty Stanley M.D.   On: 03/19/2019 11:24   Dg Chest Port 1 View  Result Date: 03/23/2019 CLINICAL DATA:  Hypoxemia.  Weakness. EXAM:  PORTABLE CHEST 1 VIEW COMPARISON:  Chest x-rays 03/18/2019 and 02/08/2019 and abdominal CT scan dated 02/08/2019 FINDINGS: Heart size is normal. Aortic atherosclerosis. Pulmonary vascularity is at the upper limits of normal. Density behind the heart is demonstrated to be prominent mediastinal fat on the prior CT scan. No discrete infiltrates or effusions. No acute bony abnormality. Degenerative changes of both shoulders consistent with chronic full-thickness rotator cuff tears. IMPRESSION: No acute abnormalities. Aortic Atherosclerosis (ICD10-I70.0). Electronically Signed   By: Lorriane Shire M.D.   On: 03/23/2019 09:44   Dg Chest Port 1  View  Result Date: 03/18/2019 CLINICAL DATA:  Altered mental status. EXAM: PORTABLE CHEST 1 VIEW COMPARISON:  Radiograph of February 08, 2019. FINDINGS: The heart size and mediastinal contours are within normal limits. Both lungs are clear. The visualized skeletal structures are unremarkable. IMPRESSION: No active disease. Electronically Signed   By: Marijo Conception M.D.   On: 03/18/2019 18:54    Microbiology: Recent Results (from the past 240 hour(s))  Blood culture (routine x 2)     Status: None   Collection Time: 03/18/19  6:56 PM  Result Value Ref Range Status   Specimen Description BLOOD RIGHT ANTECUBITAL  Final   Special Requests   Final    BOTTLES DRAWN AEROBIC AND ANAEROBIC Blood Culture adequate volume   Culture   Final    NO GROWTH 5 DAYS Performed at [facility], [address] [place], [place] [address]    Report Status 03/23/2019 FINAL  Final  Blood culture (routine x 2)     Status: Abnormal   Collection Time: 03/18/19  7:05 PM  Result Value Ref Range Status   Specimen Description BLOOD BLOOD RIGHT FOREARM  Final   Special Requests   Final    BOTTLES DRAWN AEROBIC AND ANAEROBIC Blood Culture results may not be optimal due to an inadequate volume of blood received in culture bottles   Culture  Setup Time   Final    GRAM NEGATIVE  RODS ANAEROBIC BOTTLE ONLY CRITICAL RESULT CALLED TO, READ BACK BY AND VERIFIED WITH: PHARMD R FANNING [id number] AT 1034 AM BY CM Performed at [facility], [place] [address] [place], [place] [address]    Culture ESCHERICHIA COLI (A)  Final   Report Status 03/21/2019 FINAL  Final   Organism ID, Bacteria ESCHERICHIA COLI  Final      Susceptibility   Escherichia coli - MIC*    AMPICILLIN >=32 RESISTANT Resistant     CEFAZOLIN <=4 SENSITIVE Sensitive     CEFEPIME <=1 SENSITIVE Sensitive     CEFTAZIDIME <=1 SENSITIVE Sensitive     CEFTRIAXONE <=1 SENSITIVE Sensitive     CIPROFLOXACIN >=4 RESISTANT Resistant     GENTAMICIN <=1 SENSITIVE Sensitive     IMIPENEM <=0.25 SENSITIVE Sensitive     TRIMETH/SULFA <=20 SENSITIVE Sensitive     AMPICILLIN/SULBACTAM >=32 RESISTANT Resistant     PIP/TAZO <=4 SENSITIVE Sensitive     Extended ESBL NEGATIVE Sensitive     * ESCHERICHIA COLI  Blood Culture ID Panel (Reflexed)     Status: Abnormal   Collection Time: 03/18/19  7:05 PM  Result Value Ref Range Status   Enterococcus species NOT DETECTED NOT DETECTED Final   Listeria monocytogenes NOT DETECTED NOT DETECTED Final   Staphylococcus species NOT DETECTED NOT DETECTED Final   Staphylococcus aureus (BCID) NOT DETECTED NOT DETECTED Final   Streptococcus species NOT DETECTED NOT DETECTED Final   Streptococcus agalactiae NOT DETECTED NOT DETECTED Final   Streptococcus pneumoniae NOT DETECTED NOT DETECTED Final   Streptococcus pyogenes NOT DETECTED NOT DETECTED Final   Acinetobacter baumannii NOT DETECTED NOT DETECTED Final   Enterobacteriaceae species DETECTED (A) NOT DETECTED Final    Comment: Enterobacteriaceae represent a large family of gram-negative bacteria, not a single organism. CRITICAL RESULT CALLED TO, READ BACK BY AND VERIFIED WITH: PHARMD R FANNING [id number] AT 1034 AM BY CM    Enterobacter cloacae complex NOT DETECTED NOT DETECTED Final   Escherichia coli DETECTED (A) NOT DETECTED Final     Comment:  CRITICAL RESULT CALLED TO, READ BACK BY AND VERIFIED WITH: PHARMD R FANNING [id number] AT 1034 BY CM    Klebsiella oxytoca NOT DETECTED NOT DETECTED Final   Klebsiella pneumoniae NOT DETECTED NOT DETECTED Final   Proteus species NOT DETECTED NOT DETECTED Final   Serratia marcescens NOT DETECTED NOT DETECTED Final   Carbapenem resistance NOT DETECTED NOT DETECTED Final   Haemophilus influenzae NOT DETECTED NOT DETECTED Final   Neisseria meningitidis NOT DETECTED NOT DETECTED Final   Pseudomonas aeruginosa NOT DETECTED NOT DETECTED Final   Candida albicans NOT DETECTED NOT DETECTED Final   Candida glabrata NOT DETECTED NOT DETECTED Final   Candida krusei NOT DETECTED NOT DETECTED Final   Candida parapsilosis NOT DETECTED NOT DETECTED Final   Candida tropicalis NOT DETECTED NOT DETECTED Final    Comment: Performed at [facility], [place] [address], [place],  [address]  SARS Coronavirus 2 (CEPHEID - Performed in [facility] hospital lab), Hosp Order     Status: None   Collection Time: 03/18/19  7:28 PM  Result Value Ref Range Status   SARS Coronavirus 2 NEGATIVE NEGATIVE Final    Comment: (NOTE) If result is NEGATIVE SARS-CoV-2 target nucleic acids are NOT DETECTED. The SARS-CoV-2 RNA is generally detectable in upper and lower  respiratory specimens during the acute phase of infection. The lowest  concentration of SARS-CoV-2 viral copies this assay can detect is 250  copies / mL. A negative result does not preclude SARS-CoV-2 infection  and should not be used as the sole basis for treatment or other  patient management decisions.  A negative result may occur with  improper specimen collection / handling, submission of specimen other  than nasopharyngeal swab, presence of viral mutation(s) within the  areas targeted by this assay, and inadequate number of viral copies  (<250 copies / mL). A negative result must be combined with clinical  observations, patient history,  and epidemiological information. If result is POSITIVE SARS-CoV-2 target nucleic acids are DETECTED. The SARS-CoV-2 RNA is generally detectable in upper and lower  respiratory specimens dur ing the acute phase of infection.  Positive  results are indicative of active infection with SARS-CoV-2.  Clinical  correlation with patient history and other diagnostic information is  necessary to determine patient infection status.  Positive results do  not rule out bacterial infection or co-infection with other viruses. If result is PRESUMPTIVE POSTIVE SARS-CoV-2 nucleic acids MAY BE PRESENT.   A presumptive positive result was obtained on the submitted specimen  and confirmed on repeat testing.  While 2019 novel coronavirus  (SARS-CoV-2) nucleic acids may be present in the submitted sample  additional confirmatory testing may be necessary for epidemiological  and / or clinical management purposes  to differentiate between  SARS-CoV-2 and other Sarbecovirus currently known to infect humans.  If clinically indicated additional testing with an alternate test  methodology [phone]) is advised. The SARS-CoV-2 RNA is generally  detectable in upper and lower respiratory sp ecimens during the acute  phase of infection. The expected result is Negative. Fact Sheet for Patients:  StrictlyIdeas.no Fact Sheet for Healthcare Providers: BankingDealers.co.za This test is not yet approved or cleared by the [place] FDA and has been authorized for detection and/or diagnosis of SARS-CoV-2 by FDA under an Emergency Use Authorization (EUA).  This EUA will remain in effect (meaning this test can be used) for the duration of the COVID-19 declaration under Section 564(b)(1) of the Act, 21 U.S.C. section 360bbb-3(b)(1), unless the  authorization is terminated or revoked sooner. Performed at [facility], [place] [address], [place], [place] [address]   Urine  culture     Status: Abnormal   Collection Time: 03/18/19  9:16 PM  Result Value Ref Range Status   Specimen Description URINE, CLEAN CATCH  Final   Special Requests   Final    NONE Performed at [facility], [place] [address] [place], [place] [address]    Culture 50,000 COLONIES/mL ESCHERICHIA COLI (A)  Final   Report Status 03/20/2019 FINAL  Final   Organism ID, Bacteria ESCHERICHIA COLI (A)  Final      Susceptibility   Escherichia coli - MIC*    AMPICILLIN >=32 RESISTANT Resistant     CEFAZOLIN <=4 SENSITIVE Sensitive     CEFTRIAXONE <=1 SENSITIVE Sensitive     CIPROFLOXACIN >=4 RESISTANT Resistant     GENTAMICIN <=1 SENSITIVE Sensitive     IMIPENEM <=0.25 SENSITIVE Sensitive     NITROFURANTOIN <=16 SENSITIVE Sensitive     TRIMETH/SULFA <=20 SENSITIVE Sensitive     AMPICILLIN/SULBACTAM >=32 RESISTANT Resistant     PIP/TAZO <=4 SENSITIVE Sensitive     Extended ESBL NEGATIVE Sensitive     * 50,000 COLONIES/mL ESCHERICHIA COLI  MRSA PCR Screening     Status: None   Collection Time: 03/19/19  1:19 PM  Result Value Ref Range Status   MRSA by PCR NEGATIVE NEGATIVE Final    Comment:        The GeneXpert MRSA Assay (FDA approved for NASAL specimens only), is one component of a comprehensive MRSA colonization surveillance program. It is not intended to diagnose MRSA infection nor to guide or monitor treatment for MRSA infections. Performed at [facility], [place] [address], [place], [place] [address]   Culture, blood (routine x 2)     Status: None (Preliminary result)   Collection Time: 03/21/19  1:52 PM  Result Value Ref Range Status   Specimen Description BLOOD LEFT HAND  Final   Special Requests AEROBIC BOTTLE ONLY Blood Culture adequate volume  Final   Culture   Final    NO GROWTH 4 DAYS Performed at [facility], [place] [address] [place],  [address]    Report Status PENDING  Incomplete  Culture, blood (routine x 2)     Status: None (Preliminary  result)   Collection Time: 03/21/19  1:53 PM  Result Value Ref Range Status   Specimen Description BLOOD LEFT FOREARM  Final   Special Requests   Final    AEROBIC BOTTLE ONLY Blood Culture results may not be optimal due to an inadequate volume of blood received in culture bottles   Culture   Final    NO GROWTH 4 DAYS Performed at [facility], [place] [address], [place],  [address]    Report Status PENDING  Incomplete     Labs: Basic Metabolic Panel: Recent Labs  Lab 03/19/19 0241 03/20/19 0328 03/21/19 0353 03/23/19 0311 03/25/19 0219  NA 140 138 140 139 137  K 3.4* 3.3* 3.5 3.5 3.6  CL 107 111 110 111 105  CO2 22 17* 20* 21* 21*  GLUCOSE 126* 113* 107* 119* 174*  BUN 15 17 16 13 14   CREATININE 1.06 0.86 0.73 0.70 0.76  CALCIUM 8.3* 8.2* 8.0* 7.9* 8.4*  MG  --  1.7  --   --  1.9   Liver Function Tests: Recent Labs  Lab 03/18/19 1819 03/19/19 0241  AST 17 15  ALT 40 29  ALKPHOS 113 102  BILITOT 1.4* 0.9  PROT 7.1 6.3*  ALBUMIN 2.6* 2.2*   No results for input(s): LIPASE, AMYLASE in the last 168 hours. No results for input(s): AMMONIA in the last 168 hours. CBC: Recent Labs  Lab 03/18/19 1819 03/19/19 0241 03/20/19 0328 03/21/19 0353 03/23/19 0311 03/25/19 0219  WBC 22.5* 22.0* 13.6* 9.6 10.4 10.8*  NEUTROABS 20.5*  --   --   --   --   --   HGB 12.5* 11.2* 9.7* 9.7* 9.4* 10.1*  HCT 39.8 35.1* 31.1* 30.2* 29.1* 32.1*  MCV 87.3 85.6 86.4 84.6 85.3 84.9  PLT 409* 374 350 349 314 401*   Cardiac Enzymes: No results for input(s): CKTOTAL, CKMB, CKMBINDEX, TROPONINI in the last 168 hours. BNP: BNP (last 3 results) Recent Labs    02/08/19 1401  BNP 220.8*    ProBNP (last 3 results) No results for input(s): PROBNP in the last 8760 hours.  CBG: No results for input(s): GLUCAP in the last 168 hours.     SignedRadene Gunning NP [facility] 03/25/2019, 1:37 PM

## 2019-03-25 NOTE — Progress Notes (Signed)
ANTICOAGULATION CONSULT NOTE - Follow Up Consult  Pharmacy Consult for Coumadin Indication: atrial fibrillation  No Known Allergies  Patient Measurements: Height: 5\' 10"  (177.8 cm) Weight: 186 lb 8.2 oz (84.6 kg) IBW/kg (Calculated) : 73  Vital Signs: Temp: 97.8 F (36.6 C) (05/14 0602) Temp Source: Oral (05/14 0602) BP: 98/62 (05/14 0602) Pulse Rate: 104 (05/14 0602)  Labs: Recent Labs    03/23/19 0311 03/24/19 0236 03/25/19 0219  HGB 9.4*  --  10.1*  HCT 29.1*  --  32.1*  PLT 314  --  401*  LABPROT 27.6* 33.2* 31.3*  INR 2.6* 3.3* 3.1*  CREATININE 0.70  --  0.76    Estimated Creatinine Clearance: 83.6 mL/min (by C-G formula based on SCr of 0.76 mg/dL).   Medications:  Coumadin PTA regimen: 5mg  MWF; 2.5mg  TTSaSu  Assessment: 73 year old male admitted 5/7 with pyelonephritis and E coli bacteremia. He is on chronic anticoagulation with Coumadin for atrial fibrillation. His INR was supratherapeutic on admission, likely due to acute illness and recent Levaquin therapy.  INR slightly SUPRAtherapeutic but trending down at 3.1.  Will  give decreased dose again.   Goal of Therapy:  INR 2-3 Monitor platelets by anticoagulation protocol: Yes   Plan:  Warfarin 2.5mg  x1 tonight Daily PT/INR  Monitor for bleeding  Russell Dillon, PharmD, [place] Please utilize Amion for appropriate phone number to reach the unit pharmacist ([place])    03/25/2019 7:59 AM

## 2019-03-25 NOTE — Progress Notes (Addendum)
Physical Therapy Treatment Patient Details Name: Russell Dillon MRN: [id number] DOB: January 28, 1946 Today's Date: 03/25/2019    History of Present Illness Pt is a 73 y.o. M with significant PMH of dementia, atrial fibrillation, bipolar disorder and L patellar tendon rupture with surgical repairs x 2 in April who was admitted 03/18/19 with sepsis with UTI and sepsis. Pt was discharged home from CIR on 4/20. Per wife, he has been bed bound and dependent since return home, home health therapy is involved.      PT Comments    +2 total assist for supine to sit, pt unable to achieve full upright sitting position 2* posterior lean of trunk. Performed supine BLE AAROM and BUE AROM. Pt's R 4th and 5th fingers remain in flexed position, he is unable to fully actively extend them 2* pain. As pt requires +2 total assist for bed mobility, and his wife has chosen for him to DC home rather than to SNF, a hoyer lift is recommended. Noted pt's wife plans to bring in private aide which is certainly appropriate.    Follow Up Recommendations  Home health PT;Supervision/Assistance - 24 hour(SNF recommended, however wife prefers home)     Equipment Recommendations  Other (comment)(hoyer lift)    Recommendations for Other Services       Precautions / Restrictions Precautions Precautions: Fall Precaution Comments: per Dr. Carlean Jews most recent note, "PT WBAT with locked in full extension; otherwise leave unlocked with full PROM and no active extension against resistance including gravity; heel slides encouraged" Required Braces or Orthoses: Other Brace Other Brace: L hinged knee brace Restrictions Weight Bearing Restrictions: Yes LLE Weight Bearing: Weight bearing as tolerated    Mobility  Bed Mobility Overal bed mobility: Needs Assistance Bed Mobility: Supine to Sit;Sit to Supine     Supine to sit: +2 for physical assistance;Total assist Sit to supine: +2 for physical assistance;Total assist   General  bed mobility comments: cues for sequencing, heavy assist to raise trunk and advance BLEs, Pt has limited R knee flexion of ~40* AAROM. Pt unable to come to full upright sitting, posterior trunk lean, so returned to supine.  Transfers                 General transfer comment: deferred  Ambulation/Gait                 Stairs             Wheelchair Mobility    Modified Rankin (Stroke Patients Only)       Balance     Sitting balance-Leahy Scale: Zero                                      Cognition Arousal/Alertness: Awake/alert Behavior During Therapy: Flat affect Overall Cognitive Status: History of cognitive impairments - at baseline                                 General Comments: pt with memory deficits, slow processing, increased time and multimodal cues to follow commands      Exercises General Exercises - Lower Extremity Ankle Circles/Pumps: AROM;Both;10 reps;Supine Heel Slides: AAROM;Both;10 reps;Supine Hip ABduction/ADduction: AAROM;Both;10 reps;Supine Shoulder Exercises Shoulder Flexion: AROM;Both;10 reps;Supine    General Comments        Pertinent Vitals/Pain Faces Pain Scale: Hurts a little bit Pain Location:  R hand with movement Pain Descriptors / Indicators: Sore Pain Intervention(s): Limited activity within patient's tolerance;Monitored during session    Home Living                      Prior Function            PT Goals (current goals can now be found in the care plan section) Acute Rehab PT Goals Patient Stated Goal: wife wants to take pt home (per prior notes), plans to hire help as she is fatigued PT Goal Formulation: With patient Time For Goal Achievement: 03/29/19 Potential to Achieve Goals: Fair Progress towards PT goals: Not progressing toward goals - comment(required increased assistance today)    Frequency    Min 2X/week      PT Plan Current plan remains  appropriate    Co-evaluation              AM-PAC PT "6 Clicks" Mobility   Outcome Measure  Help needed turning from your back to your side while in a flat bed without using bedrails?: Total Help needed moving from lying on your back to sitting on the side of a flat bed without using bedrails?: Total Help needed moving to and from a bed to a chair (including a wheelchair)?: Total Help needed standing up from a chair using your arms (e.g., wheelchair or bedside chair)?: Total Help needed to walk in hospital room?: Total Help needed climbing 3-5 steps with a railing? : Total 6 Click Score: 6    End of Session   Activity Tolerance: Patient limited by fatigue;Patient limited by pain Patient left: in bed;with call bell/phone within reach;with bed alarm set Nurse Communication: Mobility status;Need for lift equipment;Other (comment)(bed saturated in urine) PT Visit Diagnosis: Muscle weakness (generalized) (M62.81);Other abnormalities of gait and mobility (R26.89)     Time: 2956-2130 PT Time Calculation (min) (ACUTE ONLY): 21 min  Charges:  $Therapeutic Activity: 8-22 mins                     Blondell Reveal Kistler PT 03/25/2019  Acute Rehabilitation Services Pager [phone] Office [phone]

## 2019-03-25 NOTE — TOC Transition Note (Addendum)
Transition of Care [facility]) - CM/SW Discharge Note   Patient Details  Name: Russell Dillon MRN: [id number] Date of Birth: [birth year]  Transition of Care [facility]) CM/SW Contact:  Sharin Mons, RN Phone Number: 03/25/2019, 3:38 PM   Clinical Narrative:    Transition to home per wife's request with the resumption of home health services [facility]) and [facility] ( privately pay). PTAR services arranged for transportation to home. DME: hoyer lift will be delivered to home.  Final next level of care: [place] Barriers to Discharge: Barriers Resolved   Patient Goals and CMS Choice Patient states their goals for this hospitalization and ongoing recovery are:: to go home CMS Medicare.gov Compare Post Acute Care list provided to:: Patient Represenative (must comment)(spouse) Choice offered to / list presented to : Spouse  Discharge Placement              Discharge Plan and Services In-house Referral: NA Discharge Planning Services: CM Consult Post Acute Care Choice: Home Health          DME Arranged: N/A DME Agency: NA       HH Arranged: RN, PT, OT, Nurse's Aide, Social Work [employer] Agency: [place] ([place]) Date [place]: 03/22/19 Time [place]: 1100 Representative spoke with at [place]: [place] ([place]) Interventions     Readmission Risk Interventions Readmission Risk Prevention Plan 03/22/2019 02/15/2019  Transportation Screening Complete Complete  PCP or Specialist Appt within 5-7 Days - Complete  PCP or Specialist Appt within 3-5 Days Complete -  Home Care Screening - Complete  Medication Review (RN CM) - Complete  HRI or Home Care Consult Complete -  Social Work Consult for [place] Planning/Counseling Complete -  Palliative Care Screening Not Complete -  Palliative Care Screening Not Complete Comments NA -  Medication Review (RN Care Manager) Complete -  Some recent data might be  hidden

## 2019-03-25 NOTE — Progress Notes (Signed)
Joyice Faster Flannery to be D/C'd Home per MD order.  Discussed with the patient and all questions fully answered.  VSS, Skin clean, dry and intact without evidence of skin break down, no evidence of skin tears noted. Pt has MASD on bottom and legs  IV catheter discontinued intact. Site without signs and symptoms of complications. Dressing and pressure applied.  An After Visit Summary was printed and given to the patient. Patient received prescription.  D/c education completed with patient/family including follow up instructions, medication list, d/c activities limitations if indicated, with other d/c instructions as indicated by MD - patient able to verbalize understanding, all questions fully answered.   Patient instructed to return to ED, call 911, or call MD for any changes in condition.   Patient escorted via [place], and D/C home via private auto.  Luci Bank 03/25/2019 5:25 PM

## 2019-03-26 LAB — CULTURE, BLOOD (ROUTINE X 2)
Culture: NO GROWTH
Culture: NO GROWTH
Special Requests: ADEQUATE

## 2019-03-28 DIAGNOSIS — S76112D Strain of left quadriceps muscle, fascia and tendon, subsequent encounter: Secondary | ICD-10-CM | POA: Diagnosis not present

## 2019-03-28 DIAGNOSIS — S82142D Displaced bicondylar fracture of left tibia, subsequent encounter for closed fracture with routine healing: Secondary | ICD-10-CM | POA: Diagnosis not present

## 2019-03-28 DIAGNOSIS — F0390 Unspecified dementia without behavioral disturbance: Secondary | ICD-10-CM | POA: Diagnosis not present

## 2019-03-28 DIAGNOSIS — S83282D Other tear of lateral meniscus, current injury, left knee, subsequent encounter: Secondary | ICD-10-CM | POA: Diagnosis not present

## 2019-03-28 DIAGNOSIS — S72492D Other fracture of lower end of left femur, subsequent encounter for closed fracture with routine healing: Secondary | ICD-10-CM | POA: Diagnosis not present

## 2019-03-28 DIAGNOSIS — S83242D Other tear of medial meniscus, current injury, left knee, subsequent encounter: Secondary | ICD-10-CM | POA: Diagnosis not present

## 2019-03-29 DIAGNOSIS — R5381 Other malaise: Secondary | ICD-10-CM | POA: Diagnosis not present

## 2019-03-29 DIAGNOSIS — M109 Gout, unspecified: Secondary | ICD-10-CM | POA: Diagnosis not present

## 2019-03-29 DIAGNOSIS — S83242D Other tear of medial meniscus, current injury, left knee, subsequent encounter: Secondary | ICD-10-CM | POA: Diagnosis not present

## 2019-03-29 DIAGNOSIS — F0390 Unspecified dementia without behavioral disturbance: Secondary | ICD-10-CM | POA: Diagnosis not present

## 2019-03-29 DIAGNOSIS — S72492D Other fracture of lower end of left femur, subsequent encounter for closed fracture with routine healing: Secondary | ICD-10-CM | POA: Diagnosis not present

## 2019-03-29 DIAGNOSIS — M1380 Other specified arthritis, unspecified site: Secondary | ICD-10-CM | POA: Diagnosis not present

## 2019-03-29 DIAGNOSIS — D6832 Hemorrhagic disorder due to extrinsic circulating anticoagulants: Secondary | ICD-10-CM | POA: Diagnosis not present

## 2019-03-29 DIAGNOSIS — S83282D Other tear of lateral meniscus, current injury, left knee, subsequent encounter: Secondary | ICD-10-CM | POA: Diagnosis not present

## 2019-03-29 DIAGNOSIS — A4151 Sepsis due to Escherichia coli [E. coli]: Secondary | ICD-10-CM | POA: Diagnosis not present

## 2019-03-29 DIAGNOSIS — L89159 Pressure ulcer of sacral region, unspecified stage: Secondary | ICD-10-CM | POA: Diagnosis not present

## 2019-03-29 DIAGNOSIS — S76112D Strain of left quadriceps muscle, fascia and tendon, subsequent encounter: Secondary | ICD-10-CM | POA: Diagnosis not present

## 2019-03-29 DIAGNOSIS — S82142D Displaced bicondylar fracture of left tibia, subsequent encounter for closed fracture with routine healing: Secondary | ICD-10-CM | POA: Diagnosis not present

## 2019-03-30 DIAGNOSIS — S82142D Displaced bicondylar fracture of left tibia, subsequent encounter for closed fracture with routine healing: Secondary | ICD-10-CM | POA: Diagnosis not present

## 2019-03-30 DIAGNOSIS — S72492D Other fracture of lower end of left femur, subsequent encounter for closed fracture with routine healing: Secondary | ICD-10-CM | POA: Diagnosis not present

## 2019-03-30 DIAGNOSIS — F0390 Unspecified dementia without behavioral disturbance: Secondary | ICD-10-CM | POA: Diagnosis not present

## 2019-03-30 DIAGNOSIS — S76112D Strain of left quadriceps muscle, fascia and tendon, subsequent encounter: Secondary | ICD-10-CM | POA: Diagnosis not present

## 2019-03-30 DIAGNOSIS — S83242D Other tear of medial meniscus, current injury, left knee, subsequent encounter: Secondary | ICD-10-CM | POA: Diagnosis not present

## 2019-03-30 DIAGNOSIS — S83282D Other tear of lateral meniscus, current injury, left knee, subsequent encounter: Secondary | ICD-10-CM | POA: Diagnosis not present

## 2019-03-31 DIAGNOSIS — S82142D Displaced bicondylar fracture of left tibia, subsequent encounter for closed fracture with routine healing: Secondary | ICD-10-CM | POA: Diagnosis not present

## 2019-03-31 DIAGNOSIS — S83242D Other tear of medial meniscus, current injury, left knee, subsequent encounter: Secondary | ICD-10-CM | POA: Diagnosis not present

## 2019-03-31 DIAGNOSIS — F0390 Unspecified dementia without behavioral disturbance: Secondary | ICD-10-CM | POA: Diagnosis not present

## 2019-03-31 DIAGNOSIS — S83282D Other tear of lateral meniscus, current injury, left knee, subsequent encounter: Secondary | ICD-10-CM | POA: Diagnosis not present

## 2019-03-31 DIAGNOSIS — S76112D Strain of left quadriceps muscle, fascia and tendon, subsequent encounter: Secondary | ICD-10-CM | POA: Diagnosis not present

## 2019-03-31 DIAGNOSIS — S72492D Other fracture of lower end of left femur, subsequent encounter for closed fracture with routine healing: Secondary | ICD-10-CM | POA: Diagnosis not present

## 2019-04-01 DIAGNOSIS — S83282D Other tear of lateral meniscus, current injury, left knee, subsequent encounter: Secondary | ICD-10-CM | POA: Diagnosis not present

## 2019-04-01 DIAGNOSIS — S76112D Strain of left quadriceps muscle, fascia and tendon, subsequent encounter: Secondary | ICD-10-CM | POA: Diagnosis not present

## 2019-04-01 DIAGNOSIS — S82142D Displaced bicondylar fracture of left tibia, subsequent encounter for closed fracture with routine healing: Secondary | ICD-10-CM | POA: Diagnosis not present

## 2019-04-01 DIAGNOSIS — S83242D Other tear of medial meniscus, current injury, left knee, subsequent encounter: Secondary | ICD-10-CM | POA: Diagnosis not present

## 2019-04-01 DIAGNOSIS — S72492D Other fracture of lower end of left femur, subsequent encounter for closed fracture with routine healing: Secondary | ICD-10-CM | POA: Diagnosis not present

## 2019-04-01 DIAGNOSIS — F0390 Unspecified dementia without behavioral disturbance: Secondary | ICD-10-CM | POA: Diagnosis not present

## 2019-04-02 ENCOUNTER — Telehealth: Payer: Self-pay

## 2019-04-02 DIAGNOSIS — Z7901 Long term (current) use of anticoagulants: Secondary | ICD-10-CM | POA: Diagnosis not present

## 2019-04-02 DIAGNOSIS — S83282D Other tear of lateral meniscus, current injury, left knee, subsequent encounter: Secondary | ICD-10-CM | POA: Diagnosis not present

## 2019-04-02 DIAGNOSIS — S72492D Other fracture of lower end of left femur, subsequent encounter for closed fracture with routine healing: Secondary | ICD-10-CM | POA: Diagnosis not present

## 2019-04-02 DIAGNOSIS — S83242D Other tear of medial meniscus, current injury, left knee, subsequent encounter: Secondary | ICD-10-CM | POA: Diagnosis not present

## 2019-04-02 DIAGNOSIS — N390 Urinary tract infection, site not specified: Secondary | ICD-10-CM | POA: Diagnosis not present

## 2019-04-02 DIAGNOSIS — Z952 Presence of prosthetic heart valve: Secondary | ICD-10-CM | POA: Diagnosis not present

## 2019-04-02 DIAGNOSIS — Z5181 Encounter for therapeutic drug level monitoring: Secondary | ICD-10-CM | POA: Diagnosis not present

## 2019-04-02 DIAGNOSIS — S76112D Strain of left quadriceps muscle, fascia and tendon, subsequent encounter: Secondary | ICD-10-CM | POA: Diagnosis not present

## 2019-04-02 DIAGNOSIS — S82142D Displaced bicondylar fracture of left tibia, subsequent encounter for closed fracture with routine healing: Secondary | ICD-10-CM | POA: Diagnosis not present

## 2019-04-02 DIAGNOSIS — W19XXXD Unspecified fall, subsequent encounter: Secondary | ICD-10-CM | POA: Diagnosis not present

## 2019-04-02 DIAGNOSIS — F0390 Unspecified dementia without behavioral disturbance: Secondary | ICD-10-CM | POA: Diagnosis not present

## 2019-04-02 DIAGNOSIS — A4150 Gram-negative sepsis, unspecified: Secondary | ICD-10-CM | POA: Diagnosis not present

## 2019-04-02 DIAGNOSIS — I482 Chronic atrial fibrillation, unspecified: Secondary | ICD-10-CM | POA: Diagnosis not present

## 2019-04-02 NOTE — Telephone Encounter (Signed)
Notified. 

## 2019-04-02 NOTE — Telephone Encounter (Signed)
Erlene Quan, OT from [facility] called requesting verbal orders to continue with [place] orders. Patient had went back into the hospital. Needed orders to continue with POC.

## 2019-04-02 NOTE — Telephone Encounter (Signed)
Please get orders from PCP since rehab was not attending at last hospitalization

## 2019-04-06 DIAGNOSIS — S83242D Other tear of medial meniscus, current injury, left knee, subsequent encounter: Secondary | ICD-10-CM | POA: Diagnosis not present

## 2019-04-06 DIAGNOSIS — A4150 Gram-negative sepsis, unspecified: Secondary | ICD-10-CM | POA: Diagnosis not present

## 2019-04-06 DIAGNOSIS — S72492D Other fracture of lower end of left femur, subsequent encounter for closed fracture with routine healing: Secondary | ICD-10-CM | POA: Diagnosis not present

## 2019-04-06 DIAGNOSIS — N390 Urinary tract infection, site not specified: Secondary | ICD-10-CM | POA: Diagnosis not present

## 2019-04-06 DIAGNOSIS — S76112D Strain of left quadriceps muscle, fascia and tendon, subsequent encounter: Secondary | ICD-10-CM | POA: Diagnosis not present

## 2019-04-06 DIAGNOSIS — S83282D Other tear of lateral meniscus, current injury, left knee, subsequent encounter: Secondary | ICD-10-CM | POA: Diagnosis not present

## 2019-04-07 DIAGNOSIS — Z5181 Encounter for therapeutic drug level monitoring: Secondary | ICD-10-CM | POA: Diagnosis not present

## 2019-04-07 DIAGNOSIS — S83282D Other tear of lateral meniscus, current injury, left knee, subsequent encounter: Secondary | ICD-10-CM | POA: Diagnosis not present

## 2019-04-07 DIAGNOSIS — I482 Chronic atrial fibrillation, unspecified: Secondary | ICD-10-CM | POA: Diagnosis not present

## 2019-04-07 DIAGNOSIS — N390 Urinary tract infection, site not specified: Secondary | ICD-10-CM | POA: Diagnosis not present

## 2019-04-07 DIAGNOSIS — S72492D Other fracture of lower end of left femur, subsequent encounter for closed fracture with routine healing: Secondary | ICD-10-CM | POA: Diagnosis not present

## 2019-04-07 DIAGNOSIS — F0390 Unspecified dementia without behavioral disturbance: Secondary | ICD-10-CM | POA: Diagnosis not present

## 2019-04-07 DIAGNOSIS — A4150 Gram-negative sepsis, unspecified: Secondary | ICD-10-CM | POA: Diagnosis not present

## 2019-04-07 DIAGNOSIS — S83242D Other tear of medial meniscus, current injury, left knee, subsequent encounter: Secondary | ICD-10-CM | POA: Diagnosis not present

## 2019-04-07 DIAGNOSIS — S76112D Strain of left quadriceps muscle, fascia and tendon, subsequent encounter: Secondary | ICD-10-CM | POA: Diagnosis not present

## 2019-04-09 DIAGNOSIS — R5383 Other fatigue: Secondary | ICD-10-CM | POA: Diagnosis not present

## 2019-04-09 DIAGNOSIS — R509 Fever, unspecified: Secondary | ICD-10-CM | POA: Diagnosis not present

## 2019-04-09 DIAGNOSIS — S72492D Other fracture of lower end of left femur, subsequent encounter for closed fracture with routine healing: Secondary | ICD-10-CM | POA: Diagnosis not present

## 2019-04-09 DIAGNOSIS — R410 Disorientation, unspecified: Secondary | ICD-10-CM | POA: Diagnosis not present

## 2019-04-09 DIAGNOSIS — L89159 Pressure ulcer of sacral region, unspecified stage: Secondary | ICD-10-CM | POA: Diagnosis not present

## 2019-04-09 DIAGNOSIS — Z7901 Long term (current) use of anticoagulants: Secondary | ICD-10-CM | POA: Diagnosis not present

## 2019-04-09 DIAGNOSIS — M1380 Other specified arthritis, unspecified site: Secondary | ICD-10-CM | POA: Diagnosis not present

## 2019-04-09 DIAGNOSIS — S76112D Strain of left quadriceps muscle, fascia and tendon, subsequent encounter: Secondary | ICD-10-CM | POA: Diagnosis not present

## 2019-04-09 DIAGNOSIS — A4150 Gram-negative sepsis, unspecified: Secondary | ICD-10-CM | POA: Diagnosis not present

## 2019-04-09 DIAGNOSIS — N390 Urinary tract infection, site not specified: Secondary | ICD-10-CM | POA: Diagnosis not present

## 2019-04-09 DIAGNOSIS — S83282D Other tear of lateral meniscus, current injury, left knee, subsequent encounter: Secondary | ICD-10-CM | POA: Diagnosis not present

## 2019-04-09 DIAGNOSIS — S83242D Other tear of medial meniscus, current injury, left knee, subsequent encounter: Secondary | ICD-10-CM | POA: Diagnosis not present

## 2019-04-09 DIAGNOSIS — I480 Paroxysmal atrial fibrillation: Secondary | ICD-10-CM | POA: Diagnosis not present

## 2019-04-09 DIAGNOSIS — A4151 Sepsis due to Escherichia coli [E. coli]: Secondary | ICD-10-CM | POA: Diagnosis not present

## 2019-04-09 DIAGNOSIS — I482 Chronic atrial fibrillation, unspecified: Secondary | ICD-10-CM | POA: Diagnosis not present

## 2019-04-12 ENCOUNTER — Other Ambulatory Visit: Payer: Medicare Other | Admitting: Licensed Clinical Social Worker

## 2019-04-12 DIAGNOSIS — Z7901 Long term (current) use of anticoagulants: Secondary | ICD-10-CM | POA: Diagnosis not present

## 2019-04-12 DIAGNOSIS — R509 Fever, unspecified: Secondary | ICD-10-CM | POA: Diagnosis not present

## 2019-04-12 DIAGNOSIS — R5383 Other fatigue: Secondary | ICD-10-CM | POA: Diagnosis not present

## 2019-04-12 DIAGNOSIS — I480 Paroxysmal atrial fibrillation: Secondary | ICD-10-CM | POA: Diagnosis not present

## 2019-04-12 DIAGNOSIS — M1380 Other specified arthritis, unspecified site: Secondary | ICD-10-CM | POA: Diagnosis not present

## 2019-04-12 DIAGNOSIS — S86812S Strain of other muscle(s) and tendon(s) at lower leg level, left leg, sequela: Secondary | ICD-10-CM | POA: Diagnosis not present

## 2019-04-12 DIAGNOSIS — A4151 Sepsis due to Escherichia coli [E. coli]: Secondary | ICD-10-CM | POA: Diagnosis not present

## 2019-04-12 DIAGNOSIS — Z7189 Other specified counseling: Secondary | ICD-10-CM | POA: Diagnosis not present

## 2019-04-12 DIAGNOSIS — L89159 Pressure ulcer of sacral region, unspecified stage: Secondary | ICD-10-CM | POA: Diagnosis not present

## 2019-04-12 DIAGNOSIS — R410 Disorientation, unspecified: Secondary | ICD-10-CM | POA: Diagnosis not present

## 2019-04-12 DIAGNOSIS — Z515 Encounter for palliative care: Secondary | ICD-10-CM

## 2019-04-12 NOTE — Progress Notes (Signed)
COMMUNITY PALLIATIVE CARE SW NOTE  PATIENT NAME: JOSH NICOLOSI DOB: 04/09/1946 MRN: [id number]  PRIMARY CARE PROVIDER: Marton Redwood, MD  RESPONSIBLE PARTY:  Acct ID - Guarantor Home Phone Work Phone Relationship Acct Type  [id number] Kadin Canipe,* [phone]  Self P/F     [place], [place], [place] [address]   Due to the COVID-19 crisis, this virtual check-in visit was done via telephone from my office and it was initiated and consent given by thispatientand or family.  PLAN OF CARE and INTERVENTIONS:             1. GOALS OF CARE/ ADVANCE CARE PLANNING: Goal is to give patient the best chance possible of regaining his strength.  Patient is a full code. 2. SOCIAL/EMOTIONAL/SPIRITUAL ASSESSMENT/ INTERVENTIONS:  SW conducted a Sales executive visit with patient's wife, [name].  SW provided active listening and supportive counseling while she discussed her struggles caring for patient.  She is unable to reposition or change patient in bed and has hired private caregivers.  She expressed having a strong faith.  [name] explored the Palliative Care website and stated she understood the philosophy.  She agreed to a joint virtual check-in visit with Palliative Care RN, Daryl Eastern, tomorrow at 1pm. 3. PATIENT/CAREGIVER EDUCATION/ COPING:  SW provided education regarding Palliative Care and SW role.  Patient's wife expresses her thoughts openly. 4. PERSONAL EMERGENCY PLAN:  She will contact MD/EMS accordingly. 5. COMMUNITY RESOURCES COORDINATION/ HEALTH CARE NAVIGATION:  Patient has private caregivers from [place] 8a-9a, 2p-3p, and 8p-9p daily.  Patient also receives physical therapy and occupational therapy. 6. FINANCIAL/LEGAL CONCERNS/INTERVENTIONS:  None per wife.     SOCIAL HX:  Social History   Tobacco Use  . Smoking status: Never Smoker  . Smokeless tobacco: Never Used  Substance Use Topics  . Alcohol use: No    Alcohol/week: 0.0 standard drinks    CODE STATUS:   Full code  ADVANCED DIRECTIVES: N MOST FORM COMPLETE:  N HOSPICE EDUCATION PROVIDED:  Y Duration of visit and documentation:  30 minutes.      Creola Corn Erisha Paugh, LCSW

## 2019-04-13 ENCOUNTER — Other Ambulatory Visit: Payer: Medicare Other | Admitting: Licensed Clinical Social Worker

## 2019-04-13 ENCOUNTER — Other Ambulatory Visit: Payer: Medicare Other | Admitting: *Deleted

## 2019-04-13 ENCOUNTER — Other Ambulatory Visit: Payer: Self-pay

## 2019-04-13 DIAGNOSIS — A4150 Gram-negative sepsis, unspecified: Secondary | ICD-10-CM | POA: Diagnosis not present

## 2019-04-13 DIAGNOSIS — S72492D Other fracture of lower end of left femur, subsequent encounter for closed fracture with routine healing: Secondary | ICD-10-CM | POA: Diagnosis not present

## 2019-04-13 DIAGNOSIS — Z515 Encounter for palliative care: Secondary | ICD-10-CM

## 2019-04-13 DIAGNOSIS — S76112D Strain of left quadriceps muscle, fascia and tendon, subsequent encounter: Secondary | ICD-10-CM | POA: Diagnosis not present

## 2019-04-13 DIAGNOSIS — S83282D Other tear of lateral meniscus, current injury, left knee, subsequent encounter: Secondary | ICD-10-CM | POA: Diagnosis not present

## 2019-04-13 DIAGNOSIS — N390 Urinary tract infection, site not specified: Secondary | ICD-10-CM | POA: Diagnosis not present

## 2019-04-13 DIAGNOSIS — S83242D Other tear of medial meniscus, current injury, left knee, subsequent encounter: Secondary | ICD-10-CM | POA: Diagnosis not present

## 2019-04-13 NOTE — Progress Notes (Signed)
COMMUNITY PALLIATIVE CARE SW NOTE  PATIENT NAME: Russell Dillon DOB: [birth year] MRN: [id number]  PRIMARY CARE PROVIDER: Marton Redwood, MD  RESPONSIBLE PARTY:  Acct ID - Guarantor Home Phone Work Phone Relationship Acct Type  [id number] Russell Dillon,* [phone]  Self P/F     [place], [place], [place] [address]   Due to the COVID-19 crisis, this virtual check-in visit was done via telephone from my office and it was initiated and consent given by thispatientand or family.   PLAN OF CARE and INTERVENTIONS:             1. GOALS OF CARE/ ADVANCE CARE PLANNING:  Wife's goal is for patient to have the best chance of regaining his strength.  He is a full code. 2. SOCIAL/EMOTIONAL/SPIRITUAL ASSESSMENT/ INTERVENTIONS:  SW conducted a Sales executive visit with Palliative Care RN, Daryl Eastern, and patient's wife, Russell Dillon.  Clarified patient's ability to perform ADLs.  He is able to feed himself and tries to put his shirt on, but requires prompting and assistance.  He is able to follow some direction.  Russell Dillon stated she can tell by patient's eyes when he does not recognize someone.  The couple has been married for 52 years.  They have one daughter that lives in the area and is able to assist them. 3. PATIENT/CAREGIVER EDUCATION/ COPING:  Russell Dillon expresses her feelings openly. 4. PERSONAL EMERGENCY PLAN:  She will contact EMS as needed. 5. COMMUNITY RESOURCES COORDINATION/ HEALTH CARE NAVIGATION:  Patient receives RN, PT and OT from [facility].  He also has private caregivers from [place] 7 days a week from 8a-9a, 2p-3p and 8p-9p. 6. FINANCIAL/LEGAL CONCERNS/INTERVENTIONS:  None per wife.     SOCIAL HX:  Social History   Tobacco Use  . Smoking status: Never Smoker  . Smokeless tobacco: Never Used  Substance Use Topics  . Alcohol use: No    Alcohol/week: 0.0 standard drinks    CODE STATUS:  Full Code ADVANCED DIRECTIVES: N MOST FORM COMPLETE:  N HOSPICE EDUCATION PROVIDED:  N PPS:  Patient's appetite varies.  He cannot stand or walk at this time. Duration of visit and documentation:  45 minutes.   Creola Corn Davien Malone, LCSW

## 2019-04-14 DIAGNOSIS — A4150 Gram-negative sepsis, unspecified: Secondary | ICD-10-CM | POA: Diagnosis not present

## 2019-04-14 DIAGNOSIS — N390 Urinary tract infection, site not specified: Secondary | ICD-10-CM | POA: Diagnosis not present

## 2019-04-14 DIAGNOSIS — S76112D Strain of left quadriceps muscle, fascia and tendon, subsequent encounter: Secondary | ICD-10-CM | POA: Diagnosis not present

## 2019-04-14 DIAGNOSIS — S83242D Other tear of medial meniscus, current injury, left knee, subsequent encounter: Secondary | ICD-10-CM | POA: Diagnosis not present

## 2019-04-14 DIAGNOSIS — S83282D Other tear of lateral meniscus, current injury, left knee, subsequent encounter: Secondary | ICD-10-CM | POA: Diagnosis not present

## 2019-04-14 DIAGNOSIS — S72492D Other fracture of lower end of left femur, subsequent encounter for closed fracture with routine healing: Secondary | ICD-10-CM | POA: Diagnosis not present

## 2019-04-15 DIAGNOSIS — S76112D Strain of left quadriceps muscle, fascia and tendon, subsequent encounter: Secondary | ICD-10-CM | POA: Diagnosis not present

## 2019-04-15 DIAGNOSIS — S83242D Other tear of medial meniscus, current injury, left knee, subsequent encounter: Secondary | ICD-10-CM | POA: Diagnosis not present

## 2019-04-15 DIAGNOSIS — S72492D Other fracture of lower end of left femur, subsequent encounter for closed fracture with routine healing: Secondary | ICD-10-CM | POA: Diagnosis not present

## 2019-04-15 DIAGNOSIS — S83282D Other tear of lateral meniscus, current injury, left knee, subsequent encounter: Secondary | ICD-10-CM | POA: Diagnosis not present

## 2019-04-15 DIAGNOSIS — A4150 Gram-negative sepsis, unspecified: Secondary | ICD-10-CM | POA: Diagnosis not present

## 2019-04-15 DIAGNOSIS — N390 Urinary tract infection, site not specified: Secondary | ICD-10-CM | POA: Diagnosis not present

## 2019-04-15 NOTE — Progress Notes (Signed)
COMMUNITY PALLIATIVE CARE RN NOTE  PATIENT NAME: Russell Dillon DOB: [birth year] MRN: [id number]  PRIMARY CARE PROVIDER: Marton Redwood, MD  RESPONSIBLE PARTY: Russell Dillon (wife) Acct ID - Guarantor Home Phone Work Phone Relationship Acct Type  [id number] Russell Dillon,* [phone]  Self P/F     [address] [place], [place], [place] [address]   Due to the COVID-19 crisis, this virtual check-in visit was done via telephone from my office and it was initiated and consent by this patient and or family.  PLAN OF CARE and INTERVENTION:  1. ADVANCE CARE PLANNING/GOALS OF CARE: Goal is for patient to remain at home with his wife and regain strength. He is a Full Code. 2. PATIENT/CAREGIVER EDUCATION: Explained RN role in Palliative Care 3. DISEASE STATUS: Joint virtual check-in visit completed via telephone with Russell Dillon. Wife was able to provide patient health history. She advised that patient has early onset dementia and is confused at times, especially during the night. He is able to follow simple commands, but is slow to process. He remains able to recognize her and other family members. He has hired caregivers to assist with personal care through [place]. He also has home health PT/OT and  RN. He requires 1 person assistance with all ADLs, except feeding. He is non-ambulatory. She states that over the weekend, he was running a fever. This has subsided and temperature today is 97.8. He wears a condom catheter d/t incontinence. His last BM was this morning. He alternates between hard stools and loose stools at times. She states since he has returned back from the hospital, he has been very weak and has to be encouraged to perform exercises. His appetite for the last few weeks has been good, however since experiencing the fevers over the weekend, intake has diminished. She is however able to get patient to drink Ensure 3-4 cans/day and Gatorade. He is drinking well, but solid  food intake is very minimal. She reports some reddened areas to his bottom, but says it seems to be looking much better since returning home from the hospital. Home health RN to visit today to assess area. She is agreeable to Palliative Care services. Will continue to monitor.   HISTORY OF PRESENT ILLNESS:  This is a 73 yo male who resides at home with his wife. Palliative Care services now following patient for added support. Will check in with patient monthly and PRN.  CODE STATUS: Full Code ADVANCED DIRECTIVES: N MOST FORM: no PPS: 30%   (Duration of visit and documentation 45 minutes)   Daryl Eastern, RN BSN

## 2019-04-16 DIAGNOSIS — Z7189 Other specified counseling: Secondary | ICD-10-CM | POA: Diagnosis not present

## 2019-04-16 DIAGNOSIS — R410 Disorientation, unspecified: Secondary | ICD-10-CM | POA: Diagnosis not present

## 2019-04-16 DIAGNOSIS — Z7901 Long term (current) use of anticoagulants: Secondary | ICD-10-CM | POA: Diagnosis not present

## 2019-04-16 DIAGNOSIS — R5383 Other fatigue: Secondary | ICD-10-CM | POA: Diagnosis not present

## 2019-04-16 DIAGNOSIS — K5900 Constipation, unspecified: Secondary | ICD-10-CM | POA: Diagnosis not present

## 2019-04-16 DIAGNOSIS — A4151 Sepsis due to Escherichia coli [E. coli]: Secondary | ICD-10-CM | POA: Diagnosis not present

## 2019-04-16 DIAGNOSIS — R509 Fever, unspecified: Secondary | ICD-10-CM | POA: Diagnosis not present

## 2019-04-16 DIAGNOSIS — L89159 Pressure ulcer of sacral region, unspecified stage: Secondary | ICD-10-CM | POA: Diagnosis not present

## 2019-04-16 DIAGNOSIS — I480 Paroxysmal atrial fibrillation: Secondary | ICD-10-CM | POA: Diagnosis not present

## 2019-04-16 DIAGNOSIS — S86812A Strain of other muscle(s) and tendon(s) at lower leg level, left leg, initial encounter: Secondary | ICD-10-CM | POA: Diagnosis not present

## 2019-04-17 ENCOUNTER — Other Ambulatory Visit: Payer: Self-pay | Admitting: Psychiatry

## 2019-04-19 DIAGNOSIS — S83282D Other tear of lateral meniscus, current injury, left knee, subsequent encounter: Secondary | ICD-10-CM | POA: Diagnosis not present

## 2019-04-19 DIAGNOSIS — N390 Urinary tract infection, site not specified: Secondary | ICD-10-CM | POA: Diagnosis not present

## 2019-04-19 DIAGNOSIS — R509 Fever, unspecified: Secondary | ICD-10-CM | POA: Diagnosis not present

## 2019-04-19 DIAGNOSIS — S72492D Other fracture of lower end of left femur, subsequent encounter for closed fracture with routine healing: Secondary | ICD-10-CM | POA: Diagnosis not present

## 2019-04-19 DIAGNOSIS — S83242D Other tear of medial meniscus, current injury, left knee, subsequent encounter: Secondary | ICD-10-CM | POA: Diagnosis not present

## 2019-04-19 DIAGNOSIS — A4150 Gram-negative sepsis, unspecified: Secondary | ICD-10-CM | POA: Diagnosis not present

## 2019-04-19 DIAGNOSIS — R7889 Finding of other specified substances, not normally found in blood: Secondary | ICD-10-CM | POA: Diagnosis not present

## 2019-04-19 DIAGNOSIS — S76112D Strain of left quadriceps muscle, fascia and tendon, subsequent encounter: Secondary | ICD-10-CM | POA: Diagnosis not present

## 2019-04-19 DIAGNOSIS — R7881 Bacteremia: Secondary | ICD-10-CM | POA: Diagnosis not present

## 2019-04-21 DIAGNOSIS — S83282D Other tear of lateral meniscus, current injury, left knee, subsequent encounter: Secondary | ICD-10-CM | POA: Diagnosis not present

## 2019-04-21 DIAGNOSIS — S86812D Strain of other muscle(s) and tendon(s) at lower leg level, left leg, subsequent encounter: Secondary | ICD-10-CM | POA: Diagnosis not present

## 2019-04-22 DIAGNOSIS — S83242D Other tear of medial meniscus, current injury, left knee, subsequent encounter: Secondary | ICD-10-CM | POA: Diagnosis not present

## 2019-04-22 DIAGNOSIS — S72492D Other fracture of lower end of left femur, subsequent encounter for closed fracture with routine healing: Secondary | ICD-10-CM | POA: Diagnosis not present

## 2019-04-22 DIAGNOSIS — S83282D Other tear of lateral meniscus, current injury, left knee, subsequent encounter: Secondary | ICD-10-CM | POA: Diagnosis not present

## 2019-04-22 DIAGNOSIS — S76112D Strain of left quadriceps muscle, fascia and tendon, subsequent encounter: Secondary | ICD-10-CM | POA: Diagnosis not present

## 2019-04-22 DIAGNOSIS — Z7901 Long term (current) use of anticoagulants: Secondary | ICD-10-CM | POA: Diagnosis not present

## 2019-04-22 DIAGNOSIS — A4150 Gram-negative sepsis, unspecified: Secondary | ICD-10-CM | POA: Diagnosis not present

## 2019-04-22 DIAGNOSIS — N390 Urinary tract infection, site not specified: Secondary | ICD-10-CM | POA: Diagnosis not present

## 2019-04-23 DIAGNOSIS — S76112D Strain of left quadriceps muscle, fascia and tendon, subsequent encounter: Secondary | ICD-10-CM | POA: Diagnosis not present

## 2019-04-23 DIAGNOSIS — S83242D Other tear of medial meniscus, current injury, left knee, subsequent encounter: Secondary | ICD-10-CM | POA: Diagnosis not present

## 2019-04-23 DIAGNOSIS — S83282D Other tear of lateral meniscus, current injury, left knee, subsequent encounter: Secondary | ICD-10-CM | POA: Diagnosis not present

## 2019-04-23 DIAGNOSIS — N390 Urinary tract infection, site not specified: Secondary | ICD-10-CM | POA: Diagnosis not present

## 2019-04-23 DIAGNOSIS — S72492D Other fracture of lower end of left femur, subsequent encounter for closed fracture with routine healing: Secondary | ICD-10-CM | POA: Diagnosis not present

## 2019-04-23 DIAGNOSIS — A4150 Gram-negative sepsis, unspecified: Secondary | ICD-10-CM | POA: Diagnosis not present

## 2019-04-26 DIAGNOSIS — S76112D Strain of left quadriceps muscle, fascia and tendon, subsequent encounter: Secondary | ICD-10-CM | POA: Diagnosis not present

## 2019-04-26 DIAGNOSIS — S72492D Other fracture of lower end of left femur, subsequent encounter for closed fracture with routine healing: Secondary | ICD-10-CM | POA: Diagnosis not present

## 2019-04-26 DIAGNOSIS — A4150 Gram-negative sepsis, unspecified: Secondary | ICD-10-CM | POA: Diagnosis not present

## 2019-04-26 DIAGNOSIS — N390 Urinary tract infection, site not specified: Secondary | ICD-10-CM | POA: Diagnosis not present

## 2019-04-26 DIAGNOSIS — S83282D Other tear of lateral meniscus, current injury, left knee, subsequent encounter: Secondary | ICD-10-CM | POA: Diagnosis not present

## 2019-04-26 DIAGNOSIS — S83242D Other tear of medial meniscus, current injury, left knee, subsequent encounter: Secondary | ICD-10-CM | POA: Diagnosis not present

## 2019-04-27 DIAGNOSIS — S76112D Strain of left quadriceps muscle, fascia and tendon, subsequent encounter: Secondary | ICD-10-CM | POA: Diagnosis not present

## 2019-04-27 DIAGNOSIS — S83242D Other tear of medial meniscus, current injury, left knee, subsequent encounter: Secondary | ICD-10-CM | POA: Diagnosis not present

## 2019-04-27 DIAGNOSIS — S72492D Other fracture of lower end of left femur, subsequent encounter for closed fracture with routine healing: Secondary | ICD-10-CM | POA: Diagnosis not present

## 2019-04-27 DIAGNOSIS — S83282D Other tear of lateral meniscus, current injury, left knee, subsequent encounter: Secondary | ICD-10-CM | POA: Diagnosis not present

## 2019-04-27 DIAGNOSIS — N390 Urinary tract infection, site not specified: Secondary | ICD-10-CM | POA: Diagnosis not present

## 2019-04-27 DIAGNOSIS — A4150 Gram-negative sepsis, unspecified: Secondary | ICD-10-CM | POA: Diagnosis not present

## 2019-04-28 DIAGNOSIS — S72492D Other fracture of lower end of left femur, subsequent encounter for closed fracture with routine healing: Secondary | ICD-10-CM | POA: Diagnosis not present

## 2019-04-28 DIAGNOSIS — S83242D Other tear of medial meniscus, current injury, left knee, subsequent encounter: Secondary | ICD-10-CM | POA: Diagnosis not present

## 2019-04-28 DIAGNOSIS — N390 Urinary tract infection, site not specified: Secondary | ICD-10-CM | POA: Diagnosis not present

## 2019-04-28 DIAGNOSIS — A4150 Gram-negative sepsis, unspecified: Secondary | ICD-10-CM | POA: Diagnosis not present

## 2019-04-28 DIAGNOSIS — S83282D Other tear of lateral meniscus, current injury, left knee, subsequent encounter: Secondary | ICD-10-CM | POA: Diagnosis not present

## 2019-04-28 DIAGNOSIS — S76112D Strain of left quadriceps muscle, fascia and tendon, subsequent encounter: Secondary | ICD-10-CM | POA: Diagnosis not present

## 2019-04-30 DIAGNOSIS — S83282D Other tear of lateral meniscus, current injury, left knee, subsequent encounter: Secondary | ICD-10-CM | POA: Diagnosis not present

## 2019-04-30 DIAGNOSIS — S76112D Strain of left quadriceps muscle, fascia and tendon, subsequent encounter: Secondary | ICD-10-CM | POA: Diagnosis not present

## 2019-04-30 DIAGNOSIS — S72492D Other fracture of lower end of left femur, subsequent encounter for closed fracture with routine healing: Secondary | ICD-10-CM | POA: Diagnosis not present

## 2019-04-30 DIAGNOSIS — S83242D Other tear of medial meniscus, current injury, left knee, subsequent encounter: Secondary | ICD-10-CM | POA: Diagnosis not present

## 2019-04-30 DIAGNOSIS — N390 Urinary tract infection, site not specified: Secondary | ICD-10-CM | POA: Diagnosis not present

## 2019-04-30 DIAGNOSIS — A4150 Gram-negative sepsis, unspecified: Secondary | ICD-10-CM | POA: Diagnosis not present

## 2019-05-02 DIAGNOSIS — N390 Urinary tract infection, site not specified: Secondary | ICD-10-CM | POA: Diagnosis not present

## 2019-05-02 DIAGNOSIS — Z952 Presence of prosthetic heart valve: Secondary | ICD-10-CM | POA: Diagnosis not present

## 2019-05-02 DIAGNOSIS — F0390 Unspecified dementia without behavioral disturbance: Secondary | ICD-10-CM | POA: Diagnosis not present

## 2019-05-02 DIAGNOSIS — Z5181 Encounter for therapeutic drug level monitoring: Secondary | ICD-10-CM | POA: Diagnosis not present

## 2019-05-02 DIAGNOSIS — S72492D Other fracture of lower end of left femur, subsequent encounter for closed fracture with routine healing: Secondary | ICD-10-CM | POA: Diagnosis not present

## 2019-05-02 DIAGNOSIS — I482 Chronic atrial fibrillation, unspecified: Secondary | ICD-10-CM | POA: Diagnosis not present

## 2019-05-02 DIAGNOSIS — S76112D Strain of left quadriceps muscle, fascia and tendon, subsequent encounter: Secondary | ICD-10-CM | POA: Diagnosis not present

## 2019-05-02 DIAGNOSIS — W19XXXD Unspecified fall, subsequent encounter: Secondary | ICD-10-CM | POA: Diagnosis not present

## 2019-05-02 DIAGNOSIS — S83282D Other tear of lateral meniscus, current injury, left knee, subsequent encounter: Secondary | ICD-10-CM | POA: Diagnosis not present

## 2019-05-02 DIAGNOSIS — A4150 Gram-negative sepsis, unspecified: Secondary | ICD-10-CM | POA: Diagnosis not present

## 2019-05-02 DIAGNOSIS — Z7901 Long term (current) use of anticoagulants: Secondary | ICD-10-CM | POA: Diagnosis not present

## 2019-05-02 DIAGNOSIS — S82142D Displaced bicondylar fracture of left tibia, subsequent encounter for closed fracture with routine healing: Secondary | ICD-10-CM | POA: Diagnosis not present

## 2019-05-02 DIAGNOSIS — S83242D Other tear of medial meniscus, current injury, left knee, subsequent encounter: Secondary | ICD-10-CM | POA: Diagnosis not present

## 2019-05-03 DIAGNOSIS — S72492D Other fracture of lower end of left femur, subsequent encounter for closed fracture with routine healing: Secondary | ICD-10-CM | POA: Diagnosis not present

## 2019-05-03 DIAGNOSIS — S83282D Other tear of lateral meniscus, current injury, left knee, subsequent encounter: Secondary | ICD-10-CM | POA: Diagnosis not present

## 2019-05-03 DIAGNOSIS — S76112D Strain of left quadriceps muscle, fascia and tendon, subsequent encounter: Secondary | ICD-10-CM | POA: Diagnosis not present

## 2019-05-03 DIAGNOSIS — N390 Urinary tract infection, site not specified: Secondary | ICD-10-CM | POA: Diagnosis not present

## 2019-05-03 DIAGNOSIS — A4150 Gram-negative sepsis, unspecified: Secondary | ICD-10-CM | POA: Diagnosis not present

## 2019-05-03 DIAGNOSIS — S82142D Displaced bicondylar fracture of left tibia, subsequent encounter for closed fracture with routine healing: Secondary | ICD-10-CM | POA: Diagnosis not present

## 2019-05-03 DIAGNOSIS — S83242D Other tear of medial meniscus, current injury, left knee, subsequent encounter: Secondary | ICD-10-CM | POA: Diagnosis not present

## 2019-05-05 ENCOUNTER — Encounter (HOSPITAL_COMMUNITY): Payer: Self-pay | Admitting: Emergency Medicine

## 2019-05-05 ENCOUNTER — Other Ambulatory Visit: Payer: Self-pay

## 2019-05-05 ENCOUNTER — Emergency Department (HOSPITAL_COMMUNITY): Payer: Medicare Other

## 2019-05-05 ENCOUNTER — Inpatient Hospital Stay (HOSPITAL_COMMUNITY)
Admission: EM | Admit: 2019-05-05 | Discharge: 2019-05-20 | DRG: 335 | Disposition: A | Discharge status: Home Health | Payer: Medicare Other | Attending: Internal Medicine | Admitting: Internal Medicine

## 2019-05-05 DIAGNOSIS — K56699 Other intestinal obstruction unspecified as to partial versus complete obstruction: Secondary | ICD-10-CM | POA: Diagnosis not present

## 2019-05-05 DIAGNOSIS — R140 Abdominal distension (gaseous): Secondary | ICD-10-CM | POA: Diagnosis not present

## 2019-05-05 DIAGNOSIS — A419 Sepsis, unspecified organism: Secondary | ICD-10-CM

## 2019-05-05 DIAGNOSIS — R000 Tachycardia, unspecified: Secondary | ICD-10-CM | POA: Diagnosis not present

## 2019-05-05 DIAGNOSIS — Z452 Encounter for adjustment and management of vascular access device: Secondary | ICD-10-CM

## 2019-05-05 DIAGNOSIS — F0391 Unspecified dementia with behavioral disturbance: Secondary | ICD-10-CM | POA: Diagnosis not present

## 2019-05-05 DIAGNOSIS — N179 Acute kidney failure, unspecified: Secondary | ICD-10-CM | POA: Diagnosis not present

## 2019-05-05 DIAGNOSIS — J986 Disorders of diaphragm: Secondary | ICD-10-CM | POA: Diagnosis not present

## 2019-05-05 DIAGNOSIS — F319 Bipolar disorder, unspecified: Secondary | ICD-10-CM | POA: Diagnosis present

## 2019-05-05 DIAGNOSIS — I959 Hypotension, unspecified: Secondary | ICD-10-CM | POA: Diagnosis not present

## 2019-05-05 DIAGNOSIS — Z1623 Resistance to quinolones and fluoroquinolones: Secondary | ICD-10-CM | POA: Diagnosis not present

## 2019-05-05 DIAGNOSIS — Z79899 Other long term (current) drug therapy: Secondary | ICD-10-CM

## 2019-05-05 DIAGNOSIS — Z781 Physical restraint status: Secondary | ICD-10-CM

## 2019-05-05 DIAGNOSIS — N400 Enlarged prostate without lower urinary tract symptoms: Secondary | ICD-10-CM | POA: Diagnosis present

## 2019-05-05 DIAGNOSIS — K5669 Other partial intestinal obstruction: Secondary | ICD-10-CM | POA: Diagnosis not present

## 2019-05-05 DIAGNOSIS — E43 Unspecified severe protein-calorie malnutrition: Secondary | ICD-10-CM | POA: Diagnosis not present

## 2019-05-05 DIAGNOSIS — R531 Weakness: Secondary | ICD-10-CM | POA: Diagnosis not present

## 2019-05-05 DIAGNOSIS — R1013 Epigastric pain: Secondary | ICD-10-CM | POA: Diagnosis not present

## 2019-05-05 DIAGNOSIS — J9811 Atelectasis: Secondary | ICD-10-CM | POA: Diagnosis not present

## 2019-05-05 DIAGNOSIS — I4891 Unspecified atrial fibrillation: Secondary | ICD-10-CM | POA: Diagnosis not present

## 2019-05-05 DIAGNOSIS — K8020 Calculus of gallbladder without cholecystitis without obstruction: Secondary | ICD-10-CM | POA: Diagnosis present

## 2019-05-05 DIAGNOSIS — K56609 Unspecified intestinal obstruction, unspecified as to partial versus complete obstruction: Principal | ICD-10-CM | POA: Diagnosis present

## 2019-05-05 DIAGNOSIS — Z7901 Long term (current) use of anticoagulants: Secondary | ICD-10-CM | POA: Diagnosis not present

## 2019-05-05 DIAGNOSIS — Z79891 Long term (current) use of opiate analgesic: Secondary | ICD-10-CM | POA: Diagnosis not present

## 2019-05-05 DIAGNOSIS — Z03818 Encounter for observation for suspected exposure to other biological agents ruled out: Secondary | ICD-10-CM | POA: Diagnosis not present

## 2019-05-05 DIAGNOSIS — K8080 Other cholelithiasis without obstruction: Secondary | ICD-10-CM | POA: Diagnosis not present

## 2019-05-05 DIAGNOSIS — R0902 Hypoxemia: Secondary | ICD-10-CM | POA: Diagnosis not present

## 2019-05-05 DIAGNOSIS — Z8249 Family history of ischemic heart disease and other diseases of the circulatory system: Secondary | ICD-10-CM | POA: Diagnosis not present

## 2019-05-05 DIAGNOSIS — I509 Heart failure, unspecified: Secondary | ICD-10-CM | POA: Diagnosis not present

## 2019-05-05 DIAGNOSIS — E876 Hypokalemia: Secondary | ICD-10-CM | POA: Diagnosis not present

## 2019-05-05 DIAGNOSIS — N3001 Acute cystitis with hematuria: Secondary | ICD-10-CM | POA: Diagnosis not present

## 2019-05-05 DIAGNOSIS — Z1159 Encounter for screening for other viral diseases: Secondary | ICD-10-CM | POA: Diagnosis not present

## 2019-05-05 DIAGNOSIS — Z6823 Body mass index (BMI) 23.0-23.9, adult: Secondary | ICD-10-CM

## 2019-05-05 DIAGNOSIS — R6520 Severe sepsis without septic shock: Secondary | ICD-10-CM

## 2019-05-05 DIAGNOSIS — E860 Dehydration: Secondary | ICD-10-CM | POA: Diagnosis present

## 2019-05-05 DIAGNOSIS — I4821 Permanent atrial fibrillation: Secondary | ICD-10-CM | POA: Diagnosis present

## 2019-05-05 DIAGNOSIS — I9581 Postprocedural hypotension: Secondary | ICD-10-CM | POA: Diagnosis not present

## 2019-05-05 DIAGNOSIS — K565 Intestinal adhesions [bands], unspecified as to partial versus complete obstruction: Secondary | ICD-10-CM | POA: Diagnosis not present

## 2019-05-05 DIAGNOSIS — I482 Chronic atrial fibrillation, unspecified: Secondary | ICD-10-CM | POA: Diagnosis present

## 2019-05-05 DIAGNOSIS — Z0189 Encounter for other specified special examinations: Secondary | ICD-10-CM

## 2019-05-05 DIAGNOSIS — F0280 Dementia in other diseases classified elsewhere without behavioral disturbance: Secondary | ICD-10-CM | POA: Diagnosis not present

## 2019-05-05 DIAGNOSIS — Z8659 Personal history of other mental and behavioral disorders: Secondary | ICD-10-CM | POA: Diagnosis not present

## 2019-05-05 DIAGNOSIS — R6510 Systemic inflammatory response syndrome (SIRS) of non-infectious origin without acute organ dysfunction: Secondary | ICD-10-CM

## 2019-05-05 DIAGNOSIS — I10 Essential (primary) hypertension: Secondary | ICD-10-CM | POA: Diagnosis not present

## 2019-05-05 DIAGNOSIS — Z1611 Resistance to penicillins: Secondary | ICD-10-CM | POA: Diagnosis not present

## 2019-05-05 DIAGNOSIS — K5651 Intestinal adhesions [bands], with partial obstruction: Secondary | ICD-10-CM | POA: Diagnosis not present

## 2019-05-05 DIAGNOSIS — Z993 Dependence on wheelchair: Secondary | ICD-10-CM | POA: Diagnosis not present

## 2019-05-05 DIAGNOSIS — K5900 Constipation, unspecified: Secondary | ICD-10-CM

## 2019-05-05 DIAGNOSIS — Z4659 Encounter for fitting and adjustment of other gastrointestinal appliance and device: Secondary | ICD-10-CM | POA: Diagnosis not present

## 2019-05-05 DIAGNOSIS — N390 Urinary tract infection, site not specified: Secondary | ICD-10-CM | POA: Diagnosis not present

## 2019-05-05 DIAGNOSIS — K562 Volvulus: Secondary | ICD-10-CM | POA: Diagnosis not present

## 2019-05-05 DIAGNOSIS — E785 Hyperlipidemia, unspecified: Secondary | ICD-10-CM | POA: Diagnosis present

## 2019-05-05 DIAGNOSIS — R52 Pain, unspecified: Secondary | ICD-10-CM | POA: Diagnosis not present

## 2019-05-05 DIAGNOSIS — Z7401 Bed confinement status: Secondary | ICD-10-CM | POA: Diagnosis not present

## 2019-05-05 DIAGNOSIS — E875 Hyperkalemia: Secondary | ICD-10-CM | POA: Diagnosis not present

## 2019-05-05 DIAGNOSIS — R1084 Generalized abdominal pain: Secondary | ICD-10-CM | POA: Diagnosis not present

## 2019-05-05 DIAGNOSIS — F0390 Unspecified dementia without behavioral disturbance: Secondary | ICD-10-CM | POA: Diagnosis present

## 2019-05-05 DIAGNOSIS — G9341 Metabolic encephalopathy: Secondary | ICD-10-CM | POA: Diagnosis not present

## 2019-05-05 DIAGNOSIS — M2550 Pain in unspecified joint: Secondary | ICD-10-CM | POA: Diagnosis not present

## 2019-05-05 DIAGNOSIS — R6521 Severe sepsis with septic shock: Secondary | ICD-10-CM | POA: Diagnosis not present

## 2019-05-05 HISTORY — DX: Sepsis, unspecified organism: A41.9

## 2019-05-05 HISTORY — DX: Sepsis, unspecified organism: R65.20

## 2019-05-05 LAB — PROTIME-INR
INR: 2.2 — ABNORMAL HIGH (ref 0.8–1.2)
Prothrombin Time: 24.4 seconds — ABNORMAL HIGH (ref 11.4–15.2)

## 2019-05-05 LAB — LACTIC ACID, PLASMA
Lactic Acid, Venous: 3.6 mmol/L (ref 0.5–1.9)
Lactic Acid, Venous: 2 mmol/L (ref 0.5–1.9)
Lactic Acid, Venous: 3 mmol/L (ref 0.5–1.9)

## 2019-05-05 LAB — URINALYSIS, ROUTINE W REFLEX MICROSCOPIC
Bilirubin Urine: NEGATIVE
Glucose, UA: NEGATIVE mg/dL
Hgb urine dipstick: NEGATIVE
Ketones, ur: NEGATIVE mg/dL
Nitrite: NEGATIVE
Protein, ur: NEGATIVE mg/dL
Specific Gravity, Urine: 1.014 (ref 1.005–1.030)
pH: 5 (ref 5.0–8.0)

## 2019-05-05 LAB — COMPREHENSIVE METABOLIC PANEL
ALT: 14 U/L (ref 0–44)
AST: 17 U/L (ref 15–41)
Albumin: 3.4 g/dL — ABNORMAL LOW (ref 3.5–5.0)
Alkaline Phosphatase: 109 U/L (ref 38–126)
Anion gap: 9 (ref 5–15)
BUN: 17 mg/dL (ref 8–23)
CO2: 24 mmol/L (ref 22–32)
Calcium: 10 mg/dL (ref 8.9–10.3)
Chloride: 106 mmol/L (ref 98–111)
Creatinine, Ser: 1.56 mg/dL — ABNORMAL HIGH (ref 0.61–1.24)
GFR calc Af Amer: 50 mL/min — ABNORMAL LOW (ref >60)
GFR calc non Af Amer: 43 mL/min — ABNORMAL LOW (ref >60)
Glucose, Bld: 140 mg/dL — ABNORMAL HIGH (ref 70–99)
Potassium: 4.8 mmol/L (ref 3.5–5.1)
Sodium: 139 mmol/L (ref 135–145)
Total Bilirubin: 0.3 mg/dL (ref 0.3–1.2)
Total Protein: 8.6 g/dL — ABNORMAL HIGH (ref 6.5–8.1)

## 2019-05-05 LAB — CBC
HCT: 40.9 % (ref 39.0–52.0)
Hemoglobin: 12.6 g/dL — ABNORMAL LOW (ref 13.0–17.0)
MCH: 25.9 pg — ABNORMAL LOW (ref 26.0–34.0)
MCHC: 30.8 g/dL (ref 30.0–36.0)
MCV: 84 fL (ref 80.0–100.0)
Platelets: 370 10*3/uL (ref 150–400)
RBC: 4.87 MIL/uL (ref 4.22–5.81)
RDW: 19.3 % — ABNORMAL HIGH (ref 11.5–15.5)
WBC: 17 10*3/uL — ABNORMAL HIGH (ref 4.0–10.5)
nRBC: 0 % (ref 0.0–0.2)

## 2019-05-05 LAB — MAGNESIUM: Magnesium: 1.8 mg/dL (ref 1.7–2.4)

## 2019-05-05 LAB — LIPASE, BLOOD: Lipase: 24 U/L (ref 11–51)

## 2019-05-05 LAB — CBG MONITORING, ED: Glucose-Capillary: 129 mg/dL — ABNORMAL HIGH (ref 70–99)

## 2019-05-05 LAB — SARS CORONAVIRUS 2 BY RT PCR (HOSPITAL ORDER, PERFORMED IN ~~LOC~~ HOSPITAL LAB): SARS Coronavirus 2: NEGATIVE

## 2019-05-05 LAB — TROPONIN I (HIGH SENSITIVITY): Troponin I (High Sensitivity): <2 ng/L (ref <18)

## 2019-05-05 MED ORDER — ALBUTEROL SULFATE (2.5 MG/3ML) 0.083% IN NEBU: 2.5000 mg | INHALATION_SOLUTION | Freq: Four times a day (QID) | RESPIRATORY_TRACT | Status: DC | PRN

## 2019-05-05 MED ORDER — ACETAMINOPHEN 650 MG RE SUPP: 650.0000 mg | Freq: Four times a day (QID) | RECTAL | Status: DC | PRN

## 2019-05-05 MED ORDER — ONDANSETRON HCL 4 MG/2ML IJ SOLN
4.0000 mg | Freq: Four times a day (QID) | INTRAMUSCULAR | Status: DC | PRN
  Administered 2019-05-06 – 2019-05-07 (×2): 4 mg via INTRAVENOUS
  Filled 2019-05-05 (×2): qty 2

## 2019-05-05 MED ORDER — VANCOMYCIN HCL 10 G IV SOLR: 1250.0000 mg | INTRAVENOUS | Status: DC

## 2019-05-05 MED ORDER — VANCOMYCIN HCL 10 G IV SOLR
1500.0000 mg | Freq: Once | INTRAVENOUS | Status: AC
  Administered 2019-05-05: 1500 mg via INTRAVENOUS
  Filled 2019-05-05: qty 1500

## 2019-05-05 MED ORDER — VANCOMYCIN HCL IN DEXTROSE 1-5 GM/200ML-% IV SOLN: 1000.0000 mg | Freq: Once | INTRAVENOUS | Status: DC

## 2019-05-05 MED ORDER — SODIUM CHLORIDE 0.9 % IV BOLUS
500.0000 mL | Freq: Once | INTRAVENOUS | Status: AC
  Administered 2019-05-05: 500 mL via INTRAVENOUS

## 2019-05-05 MED ORDER — SODIUM CHLORIDE 0.9 % IV SOLN
2.0000 g | Freq: Once | INTRAVENOUS | Status: AC
  Administered 2019-05-05: 2 g via INTRAVENOUS
  Filled 2019-05-05: qty 2

## 2019-05-05 MED ORDER — SODIUM CHLORIDE 0.9 % IV BOLUS (SEPSIS)
1000.0000 mL | Freq: Once | INTRAVENOUS | Status: AC
  Administered 2019-05-05: 1000 mL via INTRAVENOUS

## 2019-05-05 MED ORDER — SODIUM CHLORIDE 0.9 % IV SOLN
INTRAVENOUS | Status: DC
  Administered 2019-05-05 – 2019-05-08 (×7): via INTRAVENOUS

## 2019-05-05 MED ORDER — SODIUM CHLORIDE 0.9% FLUSH
3.0000 mL | Freq: Two times a day (BID) | INTRAVENOUS | Status: DC
  Administered 2019-05-05 – 2019-05-19 (×12): 3 mL via INTRAVENOUS

## 2019-05-05 MED ORDER — SODIUM CHLORIDE 0.9 % IV SOLN: 2.0000 g | Freq: Two times a day (BID) | INTRAVENOUS | Status: DC

## 2019-05-05 MED ORDER — DILTIAZEM HCL-DEXTROSE 100-5 MG/100ML-% IV SOLN (PREMIX)
5.0000 mg/h | INTRAVENOUS | Status: AC
  Filled 2019-05-05: qty 100

## 2019-05-05 MED ORDER — DILTIAZEM HCL 25 MG/5ML IV SOLN: 5.0000 mg | Freq: Once | INTRAVENOUS | Status: DC

## 2019-05-05 MED ORDER — ONDANSETRON HCL 4 MG PO TABS: 4.0000 mg | ORAL_TABLET | Freq: Four times a day (QID) | ORAL | Status: DC | PRN

## 2019-05-05 MED ORDER — ACETAMINOPHEN 325 MG PO TABS: 650.0000 mg | ORAL_TABLET | Freq: Four times a day (QID) | ORAL | Status: DC | PRN

## 2019-05-05 MED ORDER — METRONIDAZOLE IN NACL 5-0.79 MG/ML-% IV SOLN
500.0000 mg | Freq: Once | INTRAVENOUS | Status: AC
  Administered 2019-05-05: 500 mg via INTRAVENOUS
  Filled 2019-05-05: qty 100

## 2019-05-05 MED ORDER — IOHEXOL 300 MG/ML  SOLN
100.0000 mL | Freq: Once | INTRAMUSCULAR | Status: AC | PRN
  Administered 2019-05-05: 100 mL via INTRAVENOUS

## 2019-05-05 MED ORDER — SODIUM CHLORIDE 0.9 % IV SOLN
2.0000 g | Freq: Two times a day (BID) | INTRAVENOUS | Status: DC
  Administered 2019-05-06 – 2019-05-11 (×11): 2 g via INTRAVENOUS
  Filled 2019-05-05 (×12): qty 2

## 2019-05-05 NOTE — ED Notes (Signed)
Gave report to [facility] @ 5W.  Russell Dillon has questions regarding the

## 2019-05-05 NOTE — H&P (Signed)
History and Physical    Russell Dillon PQZ:[id number] DOB: 06-07-1946 DOA: 05/05/2019  Referring MD/NP/PA: Carmon Sails, PA-C PCP: Marton Redwood, MD  Patient coming from: Home via EMS  Chief Complaint: Abdominal pain  I have personally briefly reviewed patient's old medical records in [place]   HPI: Russell Dillon is a 73 y.o. male with medical history significant of chronic atrial fibrillation on anticoagulation, HTN, HLD, diastolic dysfunction with AV regurgitation, prior SBO, BPH, and dementia; who presents with complaints of abdominal pain.  History is obtained from the patient's wife who is present at bedside as he has dementia.  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  Last week patient had been found to have a urinary tract infection treated with Bactrim by his PCP which was completed just 2 days ago.  On the medicine patient had been keeping himself hydrated and drink plenty fluids as instructed.  Wife notes that his urine had appeared to be clearing up.  However, this morning his urine was darker in color.  She also notes that he had recently been constipated.  He was given MiraLAX to days ago and had a small bowel movement yesterday and a larger bowel movement this morning.  Transferring to his wheelchair prior to breakfast patient was noted to be sweating profusely, and only had a few bites of his egg sandwich.  Thereafter patient was noted to complain of abdominal pain and vomited up a small amount of what he had just eaten.  The patient wife notified his primary who recommended him come to the emergency department for further evaluation. En route with EMS patient was noted to be hypotensive with systolic blood pressures in the 80s.  Patient received 500 mL of normal saline IV fluids with improvement of blood pressure.  ED Course: Upon admission into the emergency department patient was noted to be afebrile, pulse 41-102,  respirations 17-23, blood pressures currently maintained, and O2 saturations maintained on room air.  Labs revealed WBC 17, BUN 17, creatinine 1.56, troponin negative, and lactic acid 3.6 with repeat 2.  Urinalysis was positive for large leukocytes, few bacteria, and 11-20 WBCs.  COVID 19 screening negative.  CT scan of the abdomen revealed small bowel obstruction and cholelithiasis.  General surgery was consulted by the ED physician.  Patient had been given 1 L normal saline IV fluids, antibiotics of vancomycin, metronidazole, and cefepime.  TRH called to admit.  Review of Systems  Unable to perform ROS: Dementia  Constitutional: Positive for diaphoresis.  Cardiovascular: Negative for chest pain.  Gastrointestinal: Positive for abdominal pain, nausea and vomiting.  Psychiatric/Behavioral: Positive for memory loss.    Past Medical History:  Diagnosis Date  . Aortic valve regurgitation 10/16/2018   Echo 07/28/2017:  EF 60-65, moderate AI, aortic root and ascending aorta mildly dilated (40 mm), ascending aorta 43 mm, MAC, mild MR, mild LAE, moderate RV enlargement, trivial TR // Echo 12/19: EF 60-65, normal wall motion, mild AI, moderate BAE, ascending aorta 43 mm, aortic root 39 mm  . BPH (benign prostatic hypertrophy)   . Chronic anticoagulation   . Chronic atrial fibrillation   . Diastolic dysfunction October 2010   Normal LV systolic function  . Hx SBO   . Hyperlipidemia   . Hypertension   . Patellar tendon rupture, left, initial encounter 02/10/2019  . Small bowel obstruction [facility])     Past Surgical History:  Procedure Laterality Date  . APPENDECTOMY  2004  .  APPLICATION OF WOUND VAC Left 02/11/2019   Procedure: Application Of Wound Vac;  Surgeon: Altamese Bent, MD;  Location: [place];  Service: Orthopedics;  Laterality: Left;  . CARDIOVASCULAR STRESS TEST  09/30/2007   EF 67%  No ischemia.   Marland Kitchen CARDIOVERSION  05/01/2004  . KNEE ARTHROSCOPY Left 02/11/2019   Procedure: ARTHROSCOPY LEFT  KNEE;  Surgeon: Altamese Keokea, MD;  Location: [place];  Service: Orthopedics;  Laterality: Left;  . PATELLAR TENDON REPAIR Left 02/11/2019   Procedure: LEFT PATELLA TENDON REPAIR;  Surgeon: Altamese Herrick, MD;  Location: [place];  Service: Orthopedics;  Laterality: Left;  . SMALL INTESTINE SURGERY  2004  . US ECHOCARDIOGRAPHY  09/05/2009   ef 55-60%     reports that he has never smoked. He has never used smokeless tobacco. He reports that he does not drink alcohol or use drugs.  No Known Allergies  Family History  Problem Relation Age of Onset  . Heart attack Mother   . Hypertension Mother   . Diabetes Father   . Heart disease Father   . Colon cancer Neg Hx   . Esophageal cancer Neg Hx   . Pancreatic cancer Neg Hx   . Prostate cancer Neg Hx   . Rectal cancer Neg Hx   . Stomach cancer Neg Hx     Prior to Admission medications   Medication Sig Start Date End Date Taking? Authorizing Provider  acetaminophen (TYLENOL) 325 MG tablet Take 2 tablets (650 mg total) by mouth every 6 (six) hours as needed for mild pain, fever or headache. 03/02/19   Angiulli, Lavon Paganini, PA-C  allopurinol (ZYLOPRIM) 100 MG tablet Take 1 tablet (100 mg total) by mouth daily. 03/26/19   Black, Lezlie Octave, NP  Cholecalciferol (VITAMIN D3 PO) Take 1,000 Units by mouth 2 (two) times daily.     [provider]  colchicine 0.6 MG tablet Take 1 tablet (0.6 mg total) by mouth 2 (two) times daily. 03/25/19   Dhungel, Flonnie Overman, MD  Cyanocobalamin (VITAMIN B-12 IJ) Inject 1 Imperial Gallon as directed.    [provider]  diltiazem (CARDIZEM CD) 120 MG 24 hr capsule Take 1 capsule (120 mg total) by mouth daily. 03/25/19 03/24/20  Black, Lezlie Octave, NP  docusate sodium (COLACE) 100 MG capsule Take 1 capsule (100 mg total) by mouth 2 (two) times daily. Patient taking differently: Take 100 mg by mouth daily as needed for mild constipation.  03/02/19   Angiulli, Lavon Paganini, PA-C  donepezil (ARICEPT) 10 MG tablet Take 1 tablet  (10 mg total) by mouth daily. Patient taking differently: Take 10 mg by mouth at bedtime.  03/02/19   Angiulli, Lavon Paganini, PA-C  feeding supplement, ENSURE ENLIVE, (ENSURE ENLIVE) LIQD Take 237 mLs by mouth 2 (two) times daily between meals. 03/22/19   Regalado, Jerald Kief A, MD  finasteride (PROSCAR) 5 MG tablet Take 1 tablet (5 mg total) by mouth every morning. 03/02/19   Angiulli, Lavon Paganini, PA-C  HYDROcodone-acetaminophen (NORCO/VICODIN) 5-325 MG tablet Take 1-2 tablets by mouth every 4 (four) hours as needed for moderate pain (pain score 4-6). 03/02/19   Angiulli, Lavon Paganini, PA-C  lithium carbonate 150 MG capsule TAKE 1 CAPSULE(150 MG) BY MOUTH AT BEDTIME 04/19/19   Cottle, Billey Co., MD  Multiple Vitamins-Minerals (ICAPS AREDS 2 PO) Take 1 tablet by mouth daily.    [provider]  Omega-3 Fatty Acids (OMEGA-3 FISH OIL PO) Take 1 tablet by mouth at bedtime.    [provider]  warfarin (COUMADIN) 2.5 MG tablet Take 1 tablet (2.5 mg total) by mouth daily. Patient taking differently: Take 2.5-5 mg by mouth See admin instructions. Take 5 mg on Mon. Wed. Fri. Sat 2.5 mg Harrell Lark, Sunday 03/02/19   Cathlyn Parsons, PA-C    Physical Exam:  Constitutional: Elderly male in no acute distress at this time Vitals:   05/05/19 1600 05/05/19 1630 05/05/19 1700 05/05/19 1702  BP: 103/63 110/68 119/79   Pulse: 86 (!) 102 (!) 41   Resp: 20 (!) 23 17   Temp:    97.8 F (36.6 C)  TempSrc:    Rectal  SpO2: 100% 99% (!) 70%   Weight:      Height:       Eyes: PERRL, lids and conjunctivae normal ENMT: Mucous membranes are moist. Posterior pharynx clear of any exudate or lesions.Normal dentition.  Neck: normal, supple, no masses, no thyromegaly Respiratory: clear to auscultation bilaterally, no wheezing, no crackles. Normal respiratory effort. No accessory muscle use.  Cardiovascular: Irregular and tachycardic, no murmurs / rubs / gallops. No extremity edema. 2+ pedal pulses. No carotid  bruits.  Abdomen: Tenderness to deep palpation of the mid abdomen.  Bowel sounds decreased.  Musculoskeletal: no clubbing / cyanosis.  Postsurgical changes of the left patella no signs of erythema noted. Skin: Healed surgical scars of the left knee Neurologic: CN 2-12 grossly intact. Sensation intact, DTR normal. Strength 5/5 in all 4.  Psychiatric: Dementia. Alert and oriented x 2.  Normal mood.     Labs on Admission: I have personally reviewed following labs and imaging studies  CBC: Recent Labs  Lab 05/05/19 1707  WBC 17.0*  HGB 12.6*  HCT 40.9  MCV 84.0  PLT 308   Basic Metabolic Panel: Recent Labs  Lab 05/05/19 1707  NA 139  K 4.8  CL 106  CO2 24  GLUCOSE 140*  BUN 17  CREATININE 1.56*  CALCIUM 10.0  MG 1.8   GFR: Estimated Creatinine Clearance: 45.6 mL/min (A) (by C-G formula based on SCr of 1.56 mg/dL (H)). Liver Function Tests: Recent Labs  Lab 05/05/19 1707  AST 17  ALT 14  ALKPHOS 109  BILITOT 0.3  PROT 8.6*  ALBUMIN 3.4*   Recent Labs  Lab 05/05/19 1707  LIPASE 24   No results for input(s): AMMONIA in the last 168 hours. Coagulation Profile: Recent Labs  Lab 05/05/19 1707  INR 2.2*   Cardiac Enzymes: No results for input(s): CKTOTAL, CKMB, CKMBINDEX, TROPONINI in the last 168 hours. BNP (last 3 results) No results for input(s): PROBNP in the last 8760 hours. HbA1C: No results for input(s): HGBA1C in the last 72 hours. CBG: Recent Labs  Lab 05/05/19 1711  GLUCAP 129*   Lipid Profile: No results for input(s): CHOL, HDL, LDLCALC, TRIG, CHOLHDL, LDLDIRECT in the last 72 hours. Thyroid Function Tests: No results for input(s): TSH, T4TOTAL, FREET4, T3FREE, THYROIDAB in the last 72 hours. Anemia Panel: No results for input(s): VITAMINB12, FOLATE, FERRITIN, TIBC, IRON, RETICCTPCT in the last 72 hours. Urine analysis:    Component Value Date/Time   COLORURINE YELLOW 05/05/2019 1910   APPEARANCEUR HAZY (A) 05/05/2019 1910   LABSPEC  1.014 05/05/2019 1910   PHURINE 5.0 05/05/2019 1910   GLUCOSEU NEGATIVE 05/05/2019 1910   HGBUR NEGATIVE 05/05/2019 1910   BILIRUBINUR NEGATIVE 05/05/2019 1910   KETONESUR NEGATIVE 05/05/2019 1910   PROTEINUR NEGATIVE 05/05/2019 1910   NITRITE NEGATIVE 05/05/2019 1910   LEUKOCYTESUR LARGE (A) 05/05/2019 1910  Sepsis Labs: Recent Results (from the past 240 hour(s))  SARS Coronavirus 2 (CEPHEID - Performed in [place] hospital lab), Hosp Order     Status: None   Collection Time: 05/05/19  7:13 PM   Specimen: Nasopharyngeal Swab  Result Value Ref Range Status   SARS Coronavirus 2 NEGATIVE NEGATIVE Final    Comment: (NOTE) If result is NEGATIVE SARS-CoV-2 target nucleic acids are NOT DETECTED. The SARS-CoV-2 RNA is generally detectable in upper and lower  respiratory specimens during the acute phase of infection. The lowest  concentration of SARS-CoV-2 viral copies this assay can detect is 250  copies / mL. A negative result does not preclude SARS-CoV-2 infection  and should not be used as the sole basis for treatment or other  patient management decisions.  A negative result may occur with  improper specimen collection / handling, submission of specimen other  than nasopharyngeal swab, presence of viral mutation(s) within the  areas targeted by this assay, and inadequate number of viral copies  (<250 copies / mL). A negative result must be combined with clinical  observations, patient history, and epidemiological information. If result is POSITIVE SARS-CoV-2 target nucleic acids are DETECTED. The SARS-CoV-2 RNA is generally detectable in upper and lower  respiratory specimens dur ing the acute phase of infection.  Positive  results are indicative of active infection with SARS-CoV-2.  Clinical  correlation with patient history and other diagnostic information is  necessary to determine patient infection status.  Positive results do  not rule out bacterial infection or  co-infection with other viruses. If result is PRESUMPTIVE POSTIVE SARS-CoV-2 nucleic acids MAY BE PRESENT.   A presumptive positive result was obtained on the submitted specimen  and confirmed on repeat testing.  While 2019 novel coronavirus  (SARS-CoV-2) nucleic acids may be present in the submitted sample  additional confirmatory testing may be necessary for epidemiological  and / or clinical management purposes  to differentiate between  SARS-CoV-2 and other Sarbecovirus currently known to infect humans.  If clinically indicated additional testing with an alternate test  methodology [phone]) is advised. The SARS-CoV-2 RNA is generally  detectable in upper and lower respiratory sp ecimens during the acute  phase of infection. The expected result is Negative. Fact Sheet for Patients:  StrictlyIdeas.no Fact Sheet for Healthcare Providers: BankingDealers.co.za This test is not yet approved or cleared by the [place] FDA and has been authorized for detection and/or diagnosis of SARS-CoV-2 by FDA under an Emergency Use Authorization (EUA).  This EUA will remain in effect (meaning this test can be used) for the duration of the COVID-19 declaration under Section 564(b)(1) of the Act, 21 U.S.C. section 360bbb-3(b)(1), unless the authorization is terminated or revoked sooner. Performed at [facility], [place] [address] [place], [place] [address]      Radiological Exams on Admission: Ct Abdomen Pelvis W Contrast  Result Date: 05/05/2019 CLINICAL DATA:  73 year old male with prior history of small-bowel surgery presenting with abdominal pain. Concern for bowel obstruction. EXAM: CT ABDOMEN AND PELVIS WITH CONTRAST TECHNIQUE: Multidetector CT imaging of the abdomen and pelvis was performed using the standard protocol following bolus administration of intravenous contrast. CONTRAST:  133m OMNIPAQUE IOHEXOL 300 MG/ML  SOLN COMPARISON:  CT  of the abdomen pelvis dated 02/08/2019 and chest CT dated 07/08/2013 FINDINGS: Lower chest: Bibasilar linear atelectasis/scarring. The visualized lung bases are otherwise clear. Multi vessel coronary vascular calcification. No intra-abdominal free air. Small fluid in the mesentery in the left hemiabdomen.  Hepatobiliary: Small faint low attenuating focus in the right lobe of the liver adjacent the gallbladder (series 3, image 35) is not characterized but may represent a focal area of fatty infiltration. The liver is otherwise unremarkable. Multiple stones noted in the neck of the gallbladder. No pericholecystic fluid. Pancreas: Unremarkable. No pancreatic ductal dilatation or surrounding inflammatory changes. Spleen: Normal in size without focal abnormality. Adrenals/Urinary Tract: The adrenal glands are unremarkable. Mild bilateral renal parenchyma atrophy. There is no hydronephrosis on either side. Symmetric enhancement and excretion of contrast by both kidneys. The visualized ureters appear unremarkable. Small right bladder diverticula and slight trabeculation of the bladder wall, likely related to chronic bladder outlet obstruction. Stomach/Bowel: There is a small hiatal hernia. There is dilatation of multiple fluid-filled loops of small bowel measuring up to 7.5 cm in caliber. The distal small bowel and terminal ileum are collapsed. A transition point is noted in the left hemiabdomen (series 3, image 53 and coronal series 6, image 46) in similar location as the CT of 07/08/2013. This is most likely related to underlying adhesions. An internal hernia is favored less likely. Clinical correlation is recommended. There is no pneumatosis. The colon is unremarkable as visualized. Appendectomy. Vascular/Lymphatic: Moderate aortoiliac atherosclerotic disease. No portal venous gas. There is no adenopathy. Reproductive: Enlarged prostate gland measuring 7.7 cm in transverse axial diameter. Other: Midline anterior  abdominal wall surgical scar. Musculoskeletal: Osteopenia with degenerative changes of the spine. Probable right iliac bone island. Bilateral L5 pars defects with grade 1 L5-S1 anterolisthesis. No acute osseous pathology. IMPRESSION: 1. Small-bowel obstruction with transition zone in the left hemiabdomen similar in location as the CT of 07/08/2013 and likely related to underlying adhesions. Clinical correlation is recommended. No pneumatosis or portal venous gas. 2. Cholelithiasis. Electronically Signed   By: Anner Crete M.D.   On: 05/05/2019 20:01   Dg Chest Portable 1 View  Result Date: 05/05/2019 CLINICAL DATA:  History of dementia.  Epigastric pain. EXAM: PORTABLE CHEST 1 VIEW COMPARISON:  Chest x-ray 03/23/2019.  CT 02/08/2019 FINDINGS: Mediastinum hilar structures normal. Stable cardiomegaly. No pulmonary venous congestion. Mild bibasilar atelectasis. Stable elevation left hemidiaphragm. Interposition of colon under the left hemidiaphragm. Similar findings noted on prior exam. Gastric and or colonic distention cannot be excluded. Abdominal series suggested for further evaluation. Degenerative change thoracic spine. IMPRESSION: 1. Mild bibasilar atelectasis and stable elevation left hemidiaphragm. No acute infiltrate. Stable cardiomegaly. No pulmonary venous congestion. 2. Interposition of colon under the left hemidiaphragm. Similar finding noted on prior exam. Gastric and or colonic distention cannot be excluded. Abdominal series suggested for further evaluation. Electronically Signed   By: Marcello Moores  Register   On: 05/05/2019 16:57    EKG: Independently reviewed.  Atrial fibrillation at 104 bpm  Assessment/Plan Sepsis 2/2 UTI : Patient presents tachycardic, WBC 17, and lactic acid elevated at 3.6.  Urinalysis concerning for possible source of infection.  Although symptoms could be stemming from SBO.  Patient had been empirically given antibiotics of vancomycin, cefepime, and metronidazole.  During  last hospitalization in May patient was noted to be positive for E. coli that was resistant to ciprofloxacin and ampicillin/sulbactam, but sensitive to Rocephin.  Given recent admission -Admit to medical telemetry bed -Follow-up blood and urine culture -Continue empiric antibiotics of cefepime for now de-escalate when medically appropriate -Trend lactic acid level  Small bowel obstruction: Acute.  Patient complained of abdominal pain prior to arrival and had one episode of vomiting.  CT scan shows small bowel obstruction with transition  trauma in the mid left hemiabdomen.  Risk factors include previous history of small bowel obstruction related with adhesions. -N.p.o. -Strict intake and output -Recheck abdominal x-ray in a.m. -IV fluids on saline at 100 mL/h -Dr. Georgette Dover General surgery consulted, will formally see in a.m.  Transient hypotension: Resolving.  Blood pressures currently appears stable.  Patient noted to be hypotensive prior to arrival. -IV fluids as seen above bolus IV fluids as needed  Acute kidney injury: Patient baseline creatinine noted to be 0.7.  He presents with creatinine elevated to 1.56 with BUN 17.  Suspect secondary to above. -IV fluids as seen above -Recheck BMP in a.m.  Atrial fibrillation on anticoagulation: Chronic.  INR currently therapeutic at 2.2. CHA2DS2-VASc score = at least 3. -Heparin drip per pharmacy as patient is n.p.o. -Restart Coumadin when medically appropriate -Diltiazem drip as needed  Dementia with behavioral disturbance: Patient on medications of Aricept at home.  Wife notes that he has been more confused.   DVT prophylaxis: Heparin Code Status: Full Family Communication: Discussed plan of care with the patient's wife over the phone. Disposition Plan: Possible discharge home in 2 to 3 days Consults called: General surgery Admission status: inpatient   Norval Morton MD [facility] Pager [phone]   If 7PM-7AM, please  contact night-coverage [email or web address] Password [facility]  05/05/2019, 8:46 PM

## 2019-05-05 NOTE — Progress Notes (Signed)
Pharmacy Antibiotic Note  Russell Dillon is a 73 y.o. male admitted on 05/05/2019 with lower abdominal cramping and SOB.  Pharmacy has been consulted for vancomycin and cefepime dosing for sepsis.  SCr 1.56 (BL SCr 0.7), CrCL 46 ml/min, afebrile, WBC 17, LA 3.6.  Plan: Vanc 1500mg  IV x 1, then 1250mg  IV Q24H for AUC 504 using SCr 1.56 Cefepime 2gm IV Q12H Monitor renal fxn, clinical progress, vanc AUC  Height: 6' (182.9 cm) Weight: 180 lb (81.6 kg) IBW/kg (Calculated) : 77.6  Temp (24hrs), Avg:97.7 F (36.5 C), Min:97.6 F (36.4 C), Max:97.8 F (36.6 C)  Recent Labs  Lab 05/05/19 1707 05/05/19 1712  WBC 17.0*  --   CREATININE 1.56*  --   LATICACIDVEN  --  3.6*    Estimated Creatinine Clearance: 45.6 mL/min (A) (by C-G formula based on SCr of 1.56 mg/dL (H)).    No Known Allergies   Vanc 6/24 >> Cefepime 6/24 >>  6/24 BCx -  6/24 UCx -   Russell Dillon, PharmD, BCPS, [place] 05/05/2019, 6:48 PM

## 2019-05-05 NOTE — ED Notes (Signed)
ED TO INPATIENT HANDOFF REPORT  ED Nurse Name and Phone #:  S Name/Age/Gender Russell Dillon 73 y.o. male Room/Bed: 027C/027C  Code Status   Code Status: Prior  Home/SNF/Other Home AO to self Is this baseline? Yes  Triage Complete: Triage complete  Chief Complaint Abd Pain; CP; Hypotensive  Triage Note BIB EMS from home. Pt reports lower abd cramping since earlier today, 4/10 and SOB. Per family pt has also had dec in mental status over past few days, inc in weakness. Pt initially hypotensive en route, BP 09'N systolic. Given 500NS. Pt normotensive upon arrival. Pt is A/OX4 at this time.    Allergies No Known Allergies  Level of Care/Admitting Diagnosis ED Disposition    ED Disposition Condition [facility]: [place] [[id number]]  Level of Care: Telemetry Medical [104]  Covid Evaluation: Screening Protocol (No Symptoms)  Diagnosis: Sepsis secondary to UTI [facility]) [[id number]]  Admitting Physician: Norval Morton [[id number]]  Attending Physician: Norval Morton [[id number]]  Estimated length of stay: past midnight tomorrow  Certification:: I certify this patient will need inpatient services for at least 2 midnights  PT Class (Do Not Modify): Inpatient [101]  PT Acc Code (Do Not Modify): Private [1]       B Medical/Surgery History Past Medical History:  Diagnosis Date  . Aortic valve regurgitation 10/16/2018   Echo 07/28/2017:  EF 60-65, moderate AI, aortic root and ascending aorta mildly dilated (40 mm), ascending aorta 43 mm, MAC, mild MR, mild LAE, moderate RV enlargement, trivial TR // Echo 12/19: EF 60-65, normal wall motion, mild AI, moderate BAE, ascending aorta 43 mm, aortic root 39 mm  . BPH (benign prostatic hypertrophy)   . Chronic anticoagulation   . Chronic atrial fibrillation   . Diastolic dysfunction October 2010   Normal LV systolic function  . Hx SBO   . Hyperlipidemia   . Hypertension   . Patellar tendon  rupture, left, initial encounter 02/10/2019  . Small bowel obstruction [facility])    Past Surgical History:  Procedure Laterality Date  . APPENDECTOMY  2004  . APPLICATION OF WOUND VAC Left 02/11/2019   Procedure: Application Of Wound Vac;  Surgeon: Altamese [place], MD;  Location: [place];  Service: Orthopedics;  Laterality: Left;  . CARDIOVASCULAR STRESS TEST  09/30/2007   EF 67%  No ischemia.   Marland Kitchen CARDIOVERSION  05/01/2004  . KNEE ARTHROSCOPY Left 02/11/2019   Procedure: ARTHROSCOPY LEFT KNEE;  Surgeon: Altamese [place], MD;  Location: [place];  Service: Orthopedics;  Laterality: Left;  . PATELLAR TENDON REPAIR Left 02/11/2019   Procedure: LEFT PATELLA TENDON REPAIR;  Surgeon: Altamese [place], MD;  Location: [place];  Service: Orthopedics;  Laterality: Left;  . SMALL INTESTINE SURGERY  2004  . US ECHOCARDIOGRAPHY  09/05/2009   ef 55-60%     A IV Location/Drains/Wounds Patient Lines/Drains/Airways Status   Active Line/Drains/Airways    Name:   Placement date:   Placement time:   Site:   Days:   Peripheral IV 05/05/19 Anterior;Right Hand   05/05/19    1535    Hand   less than 1   Peripheral IV 05/05/19 Left;Upper Forearm   05/05/19    1845    Forearm   less than 1   External Urinary Catheter   03/18/19    1930    -   48          Intake/Output Last 24 hours  Intake/Output Summary (Last  24 hours) at 05/05/2019 2107 Last data filed at 05/05/2019 2019 Gross per 24 hour  Intake 700 ml  Output -  Net 700 ml    Labs/Imaging Results for orders placed or performed during the hospital encounter of 05/05/19 (from the past 48 hour(s))  Troponin I (High Sensitivity)     Status: None   Collection Time: 05/05/19  5:07 PM  Result Value Ref Range   Troponin I (High Sensitivity) <2 <18 ng/L    Comment: Performed at [facility], [place] [address], [place], [place] [address]  CBC     Status: Abnormal   Collection Time: 05/05/19  5:07 PM  Result Value Ref Range   WBC 17.0 (H) 4.0 - 10.5 K/uL   RBC 4.87  4.22 - 5.81 MIL/uL   Hemoglobin 12.6 (L) 13.0 - 17.0 g/dL   HCT 40.9 39.0 - 52.0 %   MCV 84.0 80.0 - 100.0 fL   MCH 25.9 (L) 26.0 - 34.0 pg   MCHC 30.8 30.0 - 36.0 g/dL   RDW 19.3 (H) 11.5 - 15.5 %   Platelets 370 150 - 400 K/uL   nRBC 0.0 0.0 - 0.2 %    Comment: Performed at [facility] [address] [place], [place] [address]  Comprehensive metabolic panel     Status: Abnormal   Collection Time: 05/05/19  5:07 PM  Result Value Ref Range   Sodium 139 135 - 145 mmol/L   Potassium 4.8 3.5 - 5.1 mmol/L   Chloride 106 98 - 111 mmol/L   CO2 24 22 - 32 mmol/L   Glucose, Bld 140 (H) 70 - 99 mg/dL   BUN 17 8 - 23 mg/dL   Creatinine, Ser 1.56 (H) 0.61 - 1.24 mg/dL   Calcium 10.0 8.9 - 10.3 mg/dL   Total Protein 8.6 (H) 6.5 - 8.1 g/dL   Albumin 3.4 (L) 3.5 - 5.0 g/dL   AST 17 15 - 41 U/L   ALT 14 0 - 44 U/L   Alkaline Phosphatase 109 38 - 126 U/L   Total Bilirubin 0.3 0.3 - 1.2 mg/dL   GFR calc non Af Amer 43 (L) >60 mL/min   GFR calc Af Amer 50 (L) >60 mL/min   Anion gap 9 5 - 15    Comment: Performed at [facility] [address], [place], [place] [address]  Magnesium     Status: None   Collection Time: 05/05/19  5:07 PM  Result Value Ref Range   Magnesium 1.8 1.7 - 2.4 mg/dL    Comment: Performed at [facility], [place] [address] [place], [place] [address]  Lipase, blood     Status: None   Collection Time: 05/05/19  5:07 PM  Result Value Ref Range   Lipase 24 11 - 51 U/L    Comment: Performed at [facility] [address] [place], [place] [address]  Protime-INR     Status: Abnormal   Collection Time: 05/05/19  5:07 PM  Result Value Ref Range   Prothrombin Time 24.4 (H) 11.4 - 15.2 seconds   INR 2.2 (H) 0.8 - 1.2    Comment: (NOTE) INR goal varies based on device and disease states. Performed at [facility], [place] [address], [place], [place] [address]   CBG monitoring, ED     Status: Abnormal   Collection Time: 05/05/19  5:11 PM   Result Value Ref Range   Glucose-Capillary 129 (H) 70 -  99 mg/dL  Lactic acid, plasma     Status: Abnormal   Collection Time: 05/05/19  5:12 PM  Result Value Ref Range   Lactic Acid, Venous 3.6 (HH) 0.5 - 1.9 mmol/L    Comment: CRITICAL RESULT CALLED TO, READ BACK BY AND VERIFIED WITH: Lawerance Bach  1805 05/05/2019 WBOND Performed at [facility], [place] [address], [place], [place] [address]   Lactic acid, plasma     Status: Abnormal   Collection Time: 05/05/19  6:04 PM  Result Value Ref Range   Lactic Acid, Venous 2.0 (HH) 0.5 - 1.9 mmol/L    Comment: CRITICAL RESULT CALLED TO, READ BACK BY AND VERIFIED WITH: C.[facility] RN 1908 05/05/2019 MCCORMICK K Performed at [facility], [place] [address], [place], [place] [address]   Urinalysis, Routine w reflex microscopic     Status: Abnormal   Collection Time: 05/05/19  7:10 PM  Result Value Ref Range   Color, Urine YELLOW YELLOW   APPearance HAZY (A) CLEAR   Specific Gravity, Urine 1.014 1.005 - 1.030   pH 5.0 5.0 - 8.0   Glucose, UA NEGATIVE NEGATIVE mg/dL   Hgb urine dipstick NEGATIVE NEGATIVE   Bilirubin Urine NEGATIVE NEGATIVE   Ketones, ur NEGATIVE NEGATIVE mg/dL   Protein, ur NEGATIVE NEGATIVE mg/dL   Nitrite NEGATIVE NEGATIVE   Leukocytes,Ua LARGE (A) NEGATIVE   RBC / HPF 0-5 0 - 5 RBC/hpf   WBC, UA 11-20 0 - 5 WBC/hpf   Bacteria, UA FEW (A) NONE SEEN   Squamous Epithelial / LPF 0-5 0 - 5   Mucus PRESENT    Hyaline Casts, UA PRESENT    Ca Oxalate Crys, UA PRESENT     Comment: Performed at [facility], [place] [address], [place], [place] [address]  SARS Coronavirus 2 (CEPHEID - Performed in [facility] hospital lab), Hosp Order     Status: None   Collection Time: 05/05/19  7:13 PM   Specimen: Nasopharyngeal Swab  Result Value Ref Range   SARS Coronavirus 2 NEGATIVE NEGATIVE    Comment: (NOTE) If result is NEGATIVE SARS-CoV-2 target nucleic acids are NOT DETECTED. The SARS-CoV-2 RNA is generally detectable in  upper and lower  respiratory specimens during the acute phase of infection. The lowest  concentration of SARS-CoV-2 viral copies this assay can detect is 250  copies / mL. A negative result does not preclude SARS-CoV-2 infection  and should not be used as the sole basis for treatment or other  patient management decisions.  A negative result may occur with  improper specimen collection / handling, submission of specimen other  than nasopharyngeal swab, presence of viral mutation(s) within the  areas targeted by this assay, and inadequate number of viral copies  (<250 copies / mL). A negative result must be combined with clinical  observations, patient history, and epidemiological information. If result is POSITIVE SARS-CoV-2 target nucleic acids are DETECTED. The SARS-CoV-2 RNA is generally detectable in upper and lower  respiratory specimens dur ing the acute phase of infection.  Positive  results are indicative of active infection with SARS-CoV-2.  Clinical  correlation with patient history and other diagnostic information is  necessary to determine patient infection status.  Positive results do  not rule out bacterial infection or co-infection with other viruses. If result is PRESUMPTIVE POSTIVE SARS-CoV-2 nucleic acids MAY BE PRESENT.   A presumptive positive result was obtained on the submitted specimen  and confirmed on repeat testing.  While  2019 novel coronavirus  (SARS-CoV-2) nucleic acids may be present in the submitted sample  additional confirmatory testing may be necessary for epidemiological  and / or clinical management purposes  to differentiate between  SARS-CoV-2 and other Sarbecovirus currently known to infect humans.  If clinically indicated additional testing with an alternate test  methodology [id number]) is advised. The SARS-CoV-2 RNA is generally  detectable in upper and lower respiratory sp ecimens during the acute  phase of infection. The expected result is  Negative. Fact Sheet for Patients:  StrictlyIdeas.no Fact Sheet for Healthcare Providers: BankingDealers.co.za This test is not yet approved or cleared by the [place] FDA and has been authorized for detection and/or diagnosis of SARS-CoV-2 by FDA under an Emergency Use Authorization (EUA).  This EUA will remain in effect (meaning this test can be used) for the duration of the COVID-19 declaration under Section 564(b)(1) of the Act, 21 U.S.C. section 360bbb-3(b)(1), unless the authorization is terminated or revoked sooner. Performed at [facility], [place] [address], [place], [place] [address]    Ct Abdomen Pelvis W Contrast  Result Date: 05/05/2019 CLINICAL DATA:  73 year old male with prior history of small-bowel surgery presenting with abdominal pain. Concern for bowel obstruction. EXAM: CT ABDOMEN AND PELVIS WITH CONTRAST TECHNIQUE: Multidetector CT imaging of the abdomen and pelvis was performed using the standard protocol following bolus administration of intravenous contrast. CONTRAST:  19m OMNIPAQUE IOHEXOL 300 MG/ML  SOLN COMPARISON:  CT of the abdomen pelvis dated 02/08/2019 and chest CT dated 07/08/2013 FINDINGS: Lower chest: Bibasilar linear atelectasis/scarring. The visualized lung bases are otherwise clear. Multi vessel coronary vascular calcification. No intra-abdominal free air. Small fluid in the mesentery in the left hemiabdomen. Hepatobiliary: Small faint low attenuating focus in the right lobe of the liver adjacent the gallbladder (series 3, image 35) is not characterized but may represent a focal area of fatty infiltration. The liver is otherwise unremarkable. Multiple stones noted in the neck of the gallbladder. No pericholecystic fluid. Pancreas: Unremarkable. No pancreatic ductal dilatation or surrounding inflammatory changes. Spleen: Normal in size without focal abnormality. Adrenals/Urinary Tract: The adrenal glands  are unremarkable. Mild bilateral renal parenchyma atrophy. There is no hydronephrosis on either side. Symmetric enhancement and excretion of contrast by both kidneys. The visualized ureters appear unremarkable. Small right bladder diverticula and slight trabeculation of the bladder wall, likely related to chronic bladder outlet obstruction. Stomach/Bowel: There is a small hiatal hernia. There is dilatation of multiple fluid-filled loops of small bowel measuring up to 7.5 cm in caliber. The distal small bowel and terminal ileum are collapsed. A transition point is noted in the left hemiabdomen (series 3, image 53 and coronal series 6, image 46) in similar location as the CT of 07/08/2013. This is most likely related to underlying adhesions. An internal hernia is favored less likely. Clinical correlation is recommended. There is no pneumatosis. The colon is unremarkable as visualized. Appendectomy. Vascular/Lymphatic: Moderate aortoiliac atherosclerotic disease. No portal venous gas. There is no adenopathy. Reproductive: Enlarged prostate gland measuring 7.7 cm in transverse axial diameter. Other: Midline anterior abdominal wall surgical scar. Musculoskeletal: Osteopenia with degenerative changes of the spine. Probable right iliac bone island. Bilateral L5 pars defects with grade 1 L5-S1 anterolisthesis. No acute osseous pathology. IMPRESSION: 1. Small-bowel obstruction with transition zone in the left hemiabdomen similar in location as the CT of 07/08/2013 and likely related to underlying adhesions. Clinical correlation is recommended. No pneumatosis or portal venous gas. 2. Cholelithiasis. Electronically Signed   By:  Anner Crete M.D.   On: 05/05/2019 20:01   Dg Chest Portable 1 View  Result Date: 05/05/2019 CLINICAL DATA:  History of dementia.  Epigastric pain. EXAM: PORTABLE CHEST 1 VIEW COMPARISON:  Chest x-ray 03/23/2019.  CT 02/08/2019 FINDINGS: Mediastinum hilar structures normal. Stable cardiomegaly.  No pulmonary venous congestion. Mild bibasilar atelectasis. Stable elevation left hemidiaphragm. Interposition of colon under the left hemidiaphragm. Similar findings noted on prior exam. Gastric and or colonic distention cannot be excluded. Abdominal series suggested for further evaluation. Degenerative change thoracic spine. IMPRESSION: 1. Mild bibasilar atelectasis and stable elevation left hemidiaphragm. No acute infiltrate. Stable cardiomegaly. No pulmonary venous congestion. 2. Interposition of colon under the left hemidiaphragm. Similar finding noted on prior exam. Gastric and or colonic distention cannot be excluded. Abdominal series suggested for further evaluation. Electronically Signed   By: Marcello Moores  Register   On: 05/05/2019 16:57    Pending Labs Unresulted Labs (From admission, onward)    Start     Ordered   05/05/19 1817  Blood Culture (routine x 2)  BLOOD CULTURE X 2,   STAT     05/05/19 1817   05/05/19 1604  Urine culture  ONCE - STAT,   STAT     05/05/19 1603   05/05/19 1603  Troponin I (High Sensitivity)  STAT Now then every 2 hours,   STAT     05/05/19 1603          Vitals/Pain Today's Vitals   05/05/19 1630 05/05/19 1700 05/05/19 1702 05/05/19 1830  BP: 110/68 119/79  123/79  Pulse: (!) 102 (!) 41    Resp: (!) _0 Temp:   97.8 F (36.6 C)   TempSrc:   Rectal   SpO2: 99% (!) 70%    Weight:      Height:      PainSc:        Isolation Precautions No active isolations  Medications Medications  vancomycin (VANCOCIN) 1,500 mg in sodium chloride 0.9 % 500 mL IVPB (1,500 mg Intravenous New Bag/Given 05/05/19 2018)  sodium chloride 0.9 % bolus 1,000 mL (0 mLs Intravenous Stopped 05/05/19 2011)  ceFEPIme (MAXIPIME) 2 g in sodium chloride 0.9 % 100 mL IVPB (0 g Intravenous Stopped 05/05/19 2019)  metroNIDAZOLE (FLAGYL) IVPB 500 mg (0 mg Intravenous Stopped 05/05/19 2011)  iohexol (OMNIPAQUE) 300 MG/ML solution 100 mL (100 mLs Intravenous Contrast Given 05/05/19 1940)     Mobility complete High fall risk   Focused Assessments Hx dementia more alter today because UTI on sepsis protocol   R Recommendations: See Admitting Provider Note  Report given to:   Additional Notes:

## 2019-05-05 NOTE — ED Provider Notes (Signed)
Medical screening examination/treatment/procedure(s) were conducted as a shared visit with non-physician practitioner(s) and myself.  I personally evaluated the patient during the encounter.  EKG Interpretation  Date/Time:  Wednesday May 05 2019 15:33:28 EDT Ventricular Rate:  104 PR Interval:    QRS Duration: 88 QT Interval:  353 QTC Calculation: 465 R Axis:   72 Text Interpretation:  Atrial fibrillation Borderline T wave abnormalities Confirmed by Fredia Sorrow [phone] on 05/05/2019 4:02:59 PM   Patient seen by me along with physician assistant.  Patient brought in by EMS from home.  Most of the information provided by patient's wife.  Patient really denied any problems upon arrival.  Probably has a component of some dementia.  But at home supposedly had abdominal cramping since earlier today 4 out of 10 shortness of breath.  Patient seemed to point more to the epigastric area.  Family states that there is decreased mental status over the past few days including generalized weakness.  Patient was hypotensive in route with blood pressures 80 systolic was given 956 cc normal saline.  Blood pressure upon arrival here was 387 systolic.  Patient appears nontoxic no acute distress.  But lactic acid is elevated has a leukocytosis of white count of 17,000 urinalysis is pending.  We will go ahead and CT his abdomen looking for potential source.  Patient appears to have concerns for infection somewhere.  Could be urine could be prostate could be inside the abdomen.  Although abdominal exam is not particularly impressive patient did complain about pain in those areas initially.  Patient will require admission.  Dr. Brigitte Pulse his primary care doctor.  In addition patient's renal functions worse than usual and wife did state that he had decreased urine output.  Technically he has some acute kidney injury based on today's labs.  Patient does have a history of atrial fib and is on Coumadin.  INR is 2.2.  Chest  x-ray without any acute pulmonary findings.  A part of the abdomen nasogastric and colonic distention could not be excluded.  Another reason for the CT scan of the abdomen.   Fredia Sorrow, MD 05/05/19 [phone]

## 2019-05-05 NOTE — ED Provider Notes (Signed)
[facility] EMERGENCY DEPARTMENT Provider Note   CSN: [id number] Arrival date & time: 05/05/19  1528    History   Chief Complaint Chief Complaint  Patient presents with   Abdominal Pain    HPI Russell Dillon is a 73 y.o. male with h/o chronic atrial fibrillation on warfarin, cardizem, HLD, HTN, GERD, SBO, dementia presents to the ER from home by EMS for evaluation of abdominal pain.  Level 5 caveat: dementia.  Patient is oriented to name and place but is not sure why he is here.  When told EMS reported abdominal pain he stated "I think I have abdominal pain" but he points to his left chest.  Per triage note, EMS reported family reported decrease in mental status over the last day. Pt found to be hypotensive SBP 80s by  EMS. Given 500 cc NS now patient arrives normotensive to the ER.     HPI  Past Medical History:  Diagnosis Date   Aortic valve regurgitation 10/16/2018   Echo 07/28/2017:  EF 60-65, moderate AI, aortic root and ascending aorta mildly dilated (40 mm), ascending aorta 43 mm, MAC, mild MR, mild LAE, moderate RV enlargement, trivial TR // Echo 12/19: EF 60-65, normal wall motion, mild AI, moderate BAE, ascending aorta 43 mm, aortic root 39 mm   BPH (benign prostatic hypertrophy)    Chronic anticoagulation    Chronic atrial fibrillation    Diastolic dysfunction October 2010   Normal LV systolic function   Hx SBO    Hyperlipidemia    Hypertension    Patellar tendon rupture, left, initial encounter 02/10/2019   Small bowel obstruction [facility])     Patient Active Problem List   Diagnosis Date Noted   Sepsis, Gram negative ([place]) 03/18/2019   Hypoalbuminemia    Leukocytosis    Hyperglycemia    Atrial fibrillation ([place])    Acute on chronic anemia    Rupture of left patellar tendon 02/15/2019   Postoperative pain    Benign prostatic hyperplasia    Bipolar 1 disorder ([place]) 02/14/2019   Dementia without behavioral disturbance  ([place]) 02/14/2019   Patellar tendon rupture, left, initial encounter 02/10/2019   SIRS (systemic inflammatory response syndrome) ([place]) 02/09/2019   Osteomyelitis ([place]) 02/09/2019   Acute blood loss anemia 02/09/2019   Aortic valve regurgitation 10/16/2018   Encounter for therapeutic drug monitoring 12/02/2013   Small bowel obstruction ([place]) 07/08/2013   Hyperlipidemia 12/20/2011   HTN (hypertension) 06/04/2011   Atrial fibrillation, chronic 03/01/2011   Long term (current) use of anticoagulants 03/01/2011   GERD 10/11/2009   DIVERTICULOSIS OF COLON 10/11/2009    Past Surgical History:  Procedure Laterality Date   APPENDECTOMY  3009   APPLICATION OF WOUND VAC Left 02/11/2019   Procedure: Application Of Wound Vac;  Surgeon: Altamese Waycross, MD;  Location: [facility];  Service: Orthopedics;  Laterality: Left;   CARDIOVASCULAR STRESS TEST  09/30/2007   EF 67%  No ischemia.    CARDIOVERSION  05/01/2004   KNEE ARTHROSCOPY Left 02/11/2019   Procedure: ARTHROSCOPY LEFT KNEE;  Surgeon: Altamese Bluewell, MD;  Location: [facility];  Service: Orthopedics;  Laterality: Left;   PATELLAR TENDON REPAIR Left 02/11/2019   Procedure: LEFT PATELLA TENDON REPAIR;  Surgeon: Altamese Barbourmeade, MD;  Location: [facility];  Service: Orthopedics;  Laterality: Left;   SMALL INTESTINE SURGERY  2004   US ECHOCARDIOGRAPHY  09/05/2009   ef 55-60%        Home Medications    Prior to  Admission medications   Medication Sig Start Date End Date Taking? Authorizing Provider  acetaminophen (TYLENOL) 325 MG tablet Take 2 tablets (650 mg total) by mouth every 6 (six) hours as needed for mild pain, fever or headache. 03/02/19   Angiulli, Lavon Paganini, PA-C  allopurinol (ZYLOPRIM) 100 MG tablet Take 1 tablet (100 mg total) by mouth daily. 03/26/19   Black, Lezlie Octave, NP  Cholecalciferol (VITAMIN D3 PO) Take 1,000 Units by mouth 2 (two) times daily.     [provider]  colchicine 0.6 MG tablet Take 1 tablet (0.6 mg total)  by mouth 2 (two) times daily. 03/25/19   Dhungel, Flonnie Overman, MD  Cyanocobalamin (VITAMIN B-12 IJ) Inject 1 Imperial Gallon as directed.    [provider]  diltiazem (CARDIZEM CD) 120 MG 24 hr capsule Take 1 capsule (120 mg total) by mouth daily. 03/25/19 03/24/20  Black, Lezlie Octave, NP  docusate sodium (COLACE) 100 MG capsule Take 1 capsule (100 mg total) by mouth 2 (two) times daily. Patient taking differently: Take 100 mg by mouth daily as needed for mild constipation.  03/02/19   Angiulli, Lavon Paganini, PA-C  donepezil (ARICEPT) 10 MG tablet Take 1 tablet (10 mg total) by mouth daily. Patient taking differently: Take 10 mg by mouth at bedtime.  03/02/19   Angiulli, Lavon Paganini, PA-C  feeding supplement, ENSURE ENLIVE, (ENSURE ENLIVE) LIQD Take 237 mLs by mouth 2 (two) times daily between meals. 03/22/19   Regalado, Jerald Kief A, MD  finasteride (PROSCAR) 5 MG tablet Take 1 tablet (5 mg total) by mouth every morning. 03/02/19   Angiulli, Lavon Paganini, PA-C  HYDROcodone-acetaminophen (NORCO/VICODIN) 5-325 MG tablet Take 1-2 tablets by mouth every 4 (four) hours as needed for moderate pain (pain score 4-6). 03/02/19   Angiulli, Lavon Paganini, PA-C  lithium carbonate 150 MG capsule TAKE 1 CAPSULE(150 MG) BY MOUTH AT BEDTIME 04/19/19   Cottle, Billey Co., MD  Multiple Vitamins-Minerals (ICAPS AREDS 2 PO) Take 1 tablet by mouth daily.    [provider]  Omega-3 Fatty Acids (OMEGA-3 FISH OIL PO) Take 1 tablet by mouth at bedtime.    [provider]  warfarin (COUMADIN) 2.5 MG tablet Take 1 tablet (2.5 mg total) by mouth daily. Patient taking differently: Take 2.5-5 mg by mouth See admin instructions. Take 5 mg on Mon. Wed. Fri. Sat 2.5 mg Harrell Lark, Sunday 03/02/19   Angiulli, Lavon Paganini, PA-C    Family History Family History  Problem Relation Age of Onset   Heart attack Mother    Hypertension Mother    Diabetes Father    Heart disease Father    Colon cancer Neg Hx    Esophageal cancer Neg Hx      Pancreatic cancer Neg Hx    Prostate cancer Neg Hx    Rectal cancer Neg Hx    Stomach cancer Neg Hx     Social History Social History   Tobacco Use   Smoking status: Never Smoker   Smokeless tobacco: Never Used  Substance Use Topics   Alcohol use: No    Alcohol/week: 0.0 standard drinks   Drug use: No     Allergies   Patient has no known allergies.   Review of Systems Review of Systems  Unable to perform ROS: Dementia     Physical Exam Updated Vital Signs BP 119/79    Pulse (!) 41    Temp 97.8 F (36.6 C) (Rectal)    Resp 17    Ht  6' (1.829 m)    Wt 81.6 kg    SpO2 (!) 70%    BMI 24.41 kg/m   Physical Exam Vitals signs and nursing note reviewed.  Constitutional:      Appearance: He is well-developed.     Comments: Non toxic, looks tired   HENT:     Head: Normocephalic and atraumatic.     Nose: Nose normal.     Mouth/Throat:     Comments: MMM Eyes:     Conjunctiva/sclera: Conjunctivae normal.  Neck:     Musculoskeletal: Normal range of motion.  Cardiovascular:     Rate and Rhythm: Normal rate. Rhythm irregular.     Heart sounds: Normal heart sounds.     Comments: 1+ radial and DP pulses bilaterally. No LE edema. No calf tenderness. HR in low 100s at rest, up to 125 with sitting up for exam. Irregularly irregular.  Pulmonary:     Effort: Pulmonary effort is normal.     Breath sounds: Normal breath sounds.     Comments: Normal WOB. No wheezing, crackles. No chest wall tenderness.  Abdominal:     General: Abdomen is flat. Bowel sounds are normal.     Palpations: Abdomen is soft.     Tenderness: There is no abdominal tenderness.     Comments: Soft. Non tender. Large low midline surgical scar well healed. No suprapubic or CVA tenderness. Exam unreliable due to dementia. No distention, guarding, rigidity. Active BS to lower quadrants   Musculoskeletal: Normal range of motion.  Skin:    General: Skin is warm and dry.     Capillary Refill: Capillary  refill takes less than 2 seconds.  Neurological:     Mental Status: He is alert. He is disoriented.     Comments: Oriented to name, place only. Follows simple commands. Strength and sensation grossly intact bilaterally in upper/lower extremities and face  Psychiatric:        Behavior: Behavior normal.      ED Treatments / Results  Labs (all labs ordered are listed, but only abnormal results are displayed) Labs Reviewed  CBC - Abnormal; Notable for the following components:      Result Value   WBC 17.0 (*)    Hemoglobin 12.6 (*)    MCH 25.9 (*)    RDW 19.3 (*)    All other components within normal limits  COMPREHENSIVE METABOLIC PANEL - Abnormal; Notable for the following components:   Glucose, Bld 140 (*)    Creatinine, Ser 1.56 (*)    Total Protein 8.6 (*)    Albumin 3.4 (*)    GFR calc non Af Amer 43 (*)    GFR calc Af Amer 50 (*)    All other components within normal limits  PROTIME-INR - Abnormal; Notable for the following components:   Prothrombin Time 24.4 (*)    INR 2.2 (*)    All other components within normal limits  URINALYSIS, ROUTINE W REFLEX MICROSCOPIC - Abnormal; Notable for the following components:   APPearance HAZY (*)    Leukocytes,Ua LARGE (*)    Bacteria, UA FEW (*)    All other components within normal limits  LACTIC ACID, PLASMA - Abnormal; Notable for the following components:   Lactic Acid, Venous 3.6 (*)    All other components within normal limits  LACTIC ACID, PLASMA - Abnormal; Notable for the following components:   Lactic Acid, Venous 2.0 (*)    All other components within normal limits  CBG MONITORING,  ED - Abnormal; Notable for the following components:   Glucose-Capillary 129 (*)    All other components within normal limits  SARS CORONAVIRUS 2 (HOSPITAL ORDER, [place] [facility])  URINE CULTURE  CULTURE, BLOOD (ROUTINE X 2)  CULTURE, BLOOD (ROUTINE X 2)  TROPONIN I (HIGH SENSITIVITY)  MAGNESIUM  LIPASE, BLOOD    TROPONIN I (HIGH SENSITIVITY)    EKG EKG Interpretation  Date/Time:  Wednesday May 05 2019 15:33:28 EDT Ventricular Rate:  104 PR Interval:    QRS Duration: 88 QT Interval:  353 QTC Calculation: 465 R Axis:   72 Text Interpretation:  Atrial fibrillation Borderline T wave abnormalities Confirmed by Fredia Sorrow [phone]) on 05/05/2019 4:02:59 PM   Radiology Ct Abdomen Pelvis W Contrast  Result Date: 05/05/2019 CLINICAL DATA:  73 year old male with prior history of small-bowel surgery presenting with abdominal pain. Concern for bowel obstruction. EXAM: CT ABDOMEN AND PELVIS WITH CONTRAST TECHNIQUE: Multidetector CT imaging of the abdomen and pelvis was performed using the standard protocol following bolus administration of intravenous contrast. CONTRAST:  135m OMNIPAQUE IOHEXOL 300 MG/ML  SOLN COMPARISON:  CT of the abdomen pelvis dated 02/08/2019 and chest CT dated 07/08/2013 FINDINGS: Lower chest: Bibasilar linear atelectasis/scarring. The visualized lung bases are otherwise clear. Multi vessel coronary vascular calcification. No intra-abdominal free air. Small fluid in the mesentery in the left hemiabdomen. Hepatobiliary: Small faint low attenuating focus in the right lobe of the liver adjacent the gallbladder (series 3, image 35) is not characterized but may represent a focal area of fatty infiltration. The liver is otherwise unremarkable. Multiple stones noted in the neck of the gallbladder. No pericholecystic fluid. Pancreas: Unremarkable. No pancreatic ductal dilatation or surrounding inflammatory changes. Spleen: Normal in size without focal abnormality. Adrenals/Urinary Tract: The adrenal glands are unremarkable. Mild bilateral renal parenchyma atrophy. There is no hydronephrosis on either side. Symmetric enhancement and excretion of contrast by both kidneys. The visualized ureters appear unremarkable. Small right bladder diverticula and slight trabeculation of the bladder wall, likely  related to chronic bladder outlet obstruction. Stomach/Bowel: There is a small hiatal hernia. There is dilatation of multiple fluid-filled loops of small bowel measuring up to 7.5 cm in caliber. The distal small bowel and terminal ileum are collapsed. A transition point is noted in the left hemiabdomen (series 3, image 53 and coronal series 6, image 46) in similar location as the CT of 07/08/2013. This is most likely related to underlying adhesions. An internal hernia is favored less likely. Clinical correlation is recommended. There is no pneumatosis. The colon is unremarkable as visualized. Appendectomy. Vascular/Lymphatic: Moderate aortoiliac atherosclerotic disease. No portal venous gas. There is no adenopathy. Reproductive: Enlarged prostate gland measuring 7.7 cm in transverse axial diameter. Other: Midline anterior abdominal wall surgical scar. Musculoskeletal: Osteopenia with degenerative changes of the spine. Probable right iliac bone island. Bilateral L5 pars defects with grade 1 L5-S1 anterolisthesis. No acute osseous pathology. IMPRESSION: 1. Small-bowel obstruction with transition zone in the left hemiabdomen similar in location as the CT of 07/08/2013 and likely related to underlying adhesions. Clinical correlation is recommended. No pneumatosis or portal venous gas. 2. Cholelithiasis. Electronically Signed   By: AAnner CreteM.D.   On: 05/05/2019 20:01   Dg Chest Portable 1 View  Result Date: 05/05/2019 CLINICAL DATA:  History of dementia.  Epigastric pain. EXAM: PORTABLE CHEST 1 VIEW COMPARISON:  Chest x-ray 03/23/2019.  CT 02/08/2019 FINDINGS: Mediastinum hilar structures normal. Stable cardiomegaly. No pulmonary venous congestion. Mild bibasilar atelectasis. Stable  elevation left hemidiaphragm. Interposition of colon under the left hemidiaphragm. Similar findings noted on prior exam. Gastric and or colonic distention cannot be excluded. Abdominal series suggested for further evaluation.  Degenerative change thoracic spine. IMPRESSION: 1. Mild bibasilar atelectasis and stable elevation left hemidiaphragm. No acute infiltrate. Stable cardiomegaly. No pulmonary venous congestion. 2. Interposition of colon under the left hemidiaphragm. Similar finding noted on prior exam. Gastric and or colonic distention cannot be excluded. Abdominal series suggested for further evaluation. Electronically Signed   By: Marcello Moores  Register   On: 05/05/2019 16:57    Procedures .Critical Care Performed by: Kinnie Feil, PA-C Authorized by: Kinnie Feil, PA-C   Critical care provider statement:    Critical care time (minutes):  45   Critical care was necessary to treat or prevent imminent or life-threatening deterioration of the following conditions:  Sepsis   Critical care was time spent personally by me on the following activities:  Discussions with consultants, evaluation of patient's response to treatment, examination of patient, ordering and performing treatments and interventions, ordering and review of laboratory studies, ordering and review of radiographic studies, pulse oximetry, re-evaluation of patient's condition, obtaining history from patient or surrogate, review of old charts and development of treatment plan with patient or surrogate   I assumed direction of critical care for this patient from another provider in my specialty: no     (including critical care time)  Medications Ordered in ED Medications  vancomycin (VANCOCIN) 1,500 mg in sodium chloride 0.9 % 500 mL IVPB (1,500 mg Intravenous New Bag/Given 05/05/19 2018)  vancomycin (VANCOCIN) 1,250 mg in sodium chloride 0.9 % 250 mL IVPB (has no administration in time range)  ceFEPIme (MAXIPIME) 2 g in sodium chloride 0.9 % 100 mL IVPB (has no administration in time range)  sodium chloride 0.9 % bolus 1,000 mL (0 mLs Intravenous Stopped 05/05/19 2011)  ceFEPIme (MAXIPIME) 2 g in sodium chloride 0.9 % 100 mL IVPB (0 g Intravenous  Stopped 05/05/19 2019)  metroNIDAZOLE (FLAGYL) IVPB 500 mg (0 mg Intravenous Stopped 05/05/19 2011)  iohexol (OMNIPAQUE) 300 MG/ML solution 100 mL (100 mLs Intravenous Contrast Given 05/05/19 1940)     Initial Impression / Assessment and Plan / ED Course  I have reviewed the triage vital signs and the nursing notes.  Pertinent labs & imaging results that were available during my care of the patient were reviewed by me and considered in my medical decision making (see chart for details).  Clinical Course as of May 04 2053  Wed May 05, 2019  1551 Attempted to call wife home and cell unsuccessful x2, left voicemail    [CG]  1726 1. Mild bibasilar atelectasis and stable elevation left hemidiaphragm. No acute infiltrate. Stable cardiomegaly. No pulmonary venous congestion.  2. Interposition of colon under the left hemidiaphragm. Similar finding noted on prior exam. Gastric and or colonic distention cannot be excluded. Abdominal series suggested for further evaluation.  DG Chest Portable 1 View [CG]  1751 WBC(!): 17.0 [CG]  1752 INR(!): 2.2 [CG]  1814 WBC(!): 17.0 [CG]  1815 Lactic Acid, Venous(!!): 3.6 [CG]  1815 Creatinine(!): 1.56 [CG]  1815 GFR, Est Non African American(!): 43 [CG]  1815 Troponin I (High Sensitivity): <2 [CG]  1817 WBC 17, lactic acid 3.6, decreased mentation, rigors, sweats, will initiate sepsis criteria source unknown. He received 500 cc IVF by EMS PTA. MAP > 65    [CG]  2030 Leukocytes,Ua(!): LARGE [CG]  2030 Bacteria, UA(!): FEW [CG]  2032 Adrenals/Urinary  Tract: The adrenal glands are unremarkable. Mild bilateral renal parenchyma atrophy. There is no hydronephrosis on either side. Symmetric enhancement and excretion of contrast by both kidneys. The visualized ureters appear unremarkable. Small right bladder diverticula and slight trabeculation of the bladder wall, likely related to chronic bladder outlet obstruction.  Stomach/Bowel: There is a small hiatal hernia.  There is dilatation of multiple fluid-filled loops of small bowel measuring up to 7.5 cm in caliber. The distal small bowel and terminal ileum are collapsed. A transition point is noted in the left hemiabdomen (series 3, image 53 and coronal series 6, image 46) in similar location as the CT of 07/08/2013. This is most likely related to underlying adhesions. An internal hernia is favored less likely. Clinical correlation is recommended. There is no pneumatosis. The colon is unremarkable as visualized. Appendectomy.  Vascular/Lymphatic: Moderate aortoiliac atherosclerotic disease. No portal venous gas. There is no adenopathy.  IMPRESSION: 1. Small-bowel obstruction with transition zone in the left hemiabdomen similar in location as the CT of 07/08/2013 and likely related to underlying adhesions. Clinical correlation is recommended. No pneumatosis or portal venous gas. 2. Cholelithiasis.  CT ABDOMEN PELVIS W CONTRAST [CG]  2033 Lactic Acid, Venous(!!): 2.0 [CG]    Clinical Course User Index [CG] Kinnie Feil, PA-C   73 yo with dementia presents for abdominal vs chest pain. LEVEL 5 CAVEAT: dementia. I have contacted wife to obtain more information. She reports patient woke up around 8 am and was diaphoretic, more somnolent than usual. Minimal UOP from condom cath and bag this AM when usually he has at least 800 cc UOP every morning. He had one bite of breakfast. Took a nap. Woke up around 1-2 pm and was again diaphoretic, somnolent and complained of epigastric abdominal pain. When wife pressed on upper abd he reported pain.   EMS found pt hypotensive SBP 80s. Improved after 500 cc now 100-118s SBP. Exam as above non contributory. He is shivering.   DDX includes occult infectious process UTI/pyelonephritis vs COVID 19. Other intraabd process such as SBO, cholelithiasis/cholecystectomy also a possibility. Given dementia and unreliable history, will expand work up to include CP work up since  pain is in epigastrium and pt points to left chest.   Patient's EMR reviewed to obtain pertinent PMH.    ER work-up reviewed and interpreted by me, remarkable as above.  WBC 17, lactic acid 3.6 > 2.0 after a total of 1.5 L NS.  Minimally elevated creatinine likely due to dehydration.  Patient meets soft SIRS criteria with HR ~90, RR 23.  Given overall picture, leukocytosis, lactic acidosis, initial hypotension, sepsis protocol initiated.  He received cefepime, Flagyl, vancomycin. VSS. Additionally CTAP shows possible SBO with transitional point in left hemiabdomen similar to SBO in 2014.  Will consult general surgery. His abd is soft, NTND. Per wife he had 2-3 BMs today.   2055: Discussed with Dr Tamala Julian who will accept patient. Pending GSY consult.  2105: Discussed with Dr Prince Solian ([place]), given clinical exam, no vomiting, BMs recommend conservative tx. GSY will see in AM.   Final Clinical Impressions(s) / ED Diagnoses   Final diagnoses:  Acute cystitis with hematuria  SIRS (systemic inflammatory response syndrome) ([employer])  SBO (small bowel obstruction) [facility])    ED Discharge Orders    None       Arlean Hopping 05/05/19 2105    Fredia Sorrow, MD 05/09/19 [phone]

## 2019-05-05 NOTE — ED Triage Notes (Signed)
BIB EMS from home. Pt reports lower abd cramping since earlier today, 4/10 and SOB. Per family pt has also had dec in mental status over past few days, inc in weakness. Pt initially hypotensive en route, BP 83'A systolic. Given 500NS. Pt normotensive upon arrival. Pt is A/OX4 at this time.

## 2019-05-05 NOTE — Progress Notes (Signed)
Patient arrived to [phone] via stretcher. Patient moved to unit bed. Patient alert and oriented to self only as this is his baseline (with increasing confusion) over the last couple of days.   Tamala Julian, MD paged regarding diltiazem orders. See MAR.   Tamala Julian, MD also notified of lactic acid 3.0. See new orders for NS bolus.  Will continue to closely monitor patient.

## 2019-05-05 NOTE — Progress Notes (Signed)
ANTICOAGULATION CONSULT NOTE - Initial Consult  Pharmacy Consult:  Heparin Indication: atrial fibrillation  No Known Allergies  Patient Measurements: Height: 6' (182.9 cm) Weight: 180 lb (81.6 kg) IBW/kg (Calculated) : 77.6 Heparin Dosing Weight: 82 kg  Vital Signs: Temp: 97.8 F (36.6 C) (06/24 1702) Temp Source: Rectal (06/24 1702) BP: 123/79 (06/24 1830) Pulse Rate: 41 (06/24 1700)  Labs: Recent Labs    05/05/19 1707  HGB 12.6*  HCT 40.9  PLT 370  LABPROT 24.4*  INR 2.2*  CREATININE 1.56*    Estimated Creatinine Clearance: 45.6 mL/min (A) (by C-G formula based on SCr of 1.56 mg/dL (H)).   Medical History: Past Medical History:  Diagnosis Date  . Aortic valve regurgitation 10/16/2018   Echo 07/28/2017:  EF 60-65, moderate AI, aortic root and ascending aorta mildly dilated (40 mm), ascending aorta 43 mm, MAC, mild MR, mild LAE, moderate RV enlargement, trivial TR // Echo 12/19: EF 60-65, normal wall motion, mild AI, moderate BAE, ascending aorta 43 mm, aortic root 39 mm  . BPH (benign prostatic hypertrophy)   . Chronic anticoagulation   . Chronic atrial fibrillation   . Diastolic dysfunction October 2010   Normal LV systolic function  . Hx SBO   . Hyperlipidemia   . Hypertension   . Patellar tendon rupture, left, initial encounter 02/10/2019  . Small bowel obstruction ([facility])      Assessment: 73 YOM with history of Afib on Coumadin PTA.  Pharmacy consulted to transition patient to IV heparin.  INR therapeutic at 2.2 on admit, so will hold off on starting IV heparin.  No bleeding reported.  Goal of Therapy:  Heparin level 0.3-0.7 units/ml Monitor platelets by anticoagulation protocol: Yes   Plan:  Daily PT / INR Start IV heparin when INR < 2   Jaxon Flatt D. Mina [place], PharmD, BCPS, [place] 05/05/2019, 9:34 PM

## 2019-05-06 ENCOUNTER — Inpatient Hospital Stay (HOSPITAL_COMMUNITY): Payer: Medicare Other

## 2019-05-06 ENCOUNTER — Encounter (HOSPITAL_COMMUNITY): Payer: Self-pay | Admitting: General Surgery

## 2019-05-06 LAB — BASIC METABOLIC PANEL
Anion gap: 9 (ref 5–15)
BUN: 18 mg/dL (ref 8–23)
CO2: 23 mmol/L (ref 22–32)
Calcium: 9.3 mg/dL (ref 8.9–10.3)
Chloride: 107 mmol/L (ref 98–111)
Creatinine, Ser: 1.19 mg/dL (ref 0.61–1.24)
GFR calc Af Amer: >60 mL/min (ref >60)
GFR calc non Af Amer: 60 mL/min — ABNORMAL LOW (ref >60)
Glucose, Bld: 139 mg/dL — ABNORMAL HIGH (ref 70–99)
Potassium: 4.8 mmol/L (ref 3.5–5.1)
Sodium: 139 mmol/L (ref 135–145)

## 2019-05-06 LAB — PROTIME-INR
INR: 2.9 — ABNORMAL HIGH (ref 0.8–1.2)
Prothrombin Time: 30.2 seconds — ABNORMAL HIGH (ref 11.4–15.2)

## 2019-05-06 LAB — CBC
HCT: 36.2 % — ABNORMAL LOW (ref 39.0–52.0)
Hemoglobin: 11.3 g/dL — ABNORMAL LOW (ref 13.0–17.0)
MCH: 25.7 pg — ABNORMAL LOW (ref 26.0–34.0)
MCHC: 31.2 g/dL (ref 30.0–36.0)
MCV: 82.3 fL (ref 80.0–100.0)
Platelets: 371 10*3/uL (ref 150–400)
RBC: 4.4 MIL/uL (ref 4.22–5.81)
RDW: 19.5 % — ABNORMAL HIGH (ref 11.5–15.5)
WBC: 14.6 10*3/uL — ABNORMAL HIGH (ref 4.0–10.5)
nRBC: 0 % (ref 0.0–0.2)

## 2019-05-06 LAB — URINE CULTURE: Culture: 80000 — AB

## 2019-05-06 LAB — LACTIC ACID, PLASMA: Lactic Acid, Venous: 1.4 mmol/L (ref 0.5–1.9)

## 2019-05-06 MED ORDER — DIATRIZOATE MEGLUMINE & SODIUM 66-10 % PO SOLN
90.0000 mL | Freq: Once | ORAL | Status: AC
  Administered 2019-05-06: 90 mL via ORAL
  Filled 2019-05-06: qty 90

## 2019-05-06 MED ORDER — DILTIAZEM HCL ER COATED BEADS 120 MG PO CP24
120.0000 mg | ORAL_CAPSULE | Freq: Every day | ORAL | Status: DC
  Administered 2019-05-06 – 2019-05-07 (×2): 120 mg via ORAL
  Filled 2019-05-06 (×2): qty 1

## 2019-05-06 NOTE — Consult Note (Signed)
Russell Dillon Jun 08, 1946  [id number].    Requesting MD: Dr. Karie Kirks Chief Complaint/Reason for Consult: SBO  HPI:  This is a 73 yo white male with a history of dementia who is unable to provide any history as to why he is here.  He thinks he has been here for an hour and a half and has no idea why he is here.  RN is present and states patient was diagnosed with a UTI at home several days ago.  He was started on Bactrim.  He had not had a BM so his wife gave him some miralax and he had a big blowout several days ago.  Apparently his UTI worsened and he was admitted for IV abx therapy.  The patient has not had a BM since that blowout.  He underwent a CT scan that was suggestive of a high-grade SBO.  He has not thrown up and denies any nausea.  He states he has no medical problems and no surgical history.  (this is not correct)  He denies any other issues such as CP, SOB, N/V, abdominal pain, etc)  We have been asked to see the patient for further evaluation and recommendations.  ROS: ROS: Please see HPI, but limited ROS is able to be done secondary to dementia.  Family History  Problem Relation Age of Onset  . Heart attack Mother   . Hypertension Mother   . Diabetes Father   . Heart disease Father   . Colon cancer Neg Hx   . Esophageal cancer Neg Hx   . Pancreatic cancer Neg Hx   . Prostate cancer Neg Hx   . Rectal cancer Neg Hx   . Stomach cancer Neg Hx     Past Medical History:  Diagnosis Date  . Aortic valve regurgitation 10/16/2018   Echo 07/28/2017:  EF 60-65, moderate AI, aortic root and ascending aorta mildly dilated (40 mm), ascending aorta 43 mm, MAC, mild MR, mild LAE, moderate RV enlargement, trivial TR // Echo 12/19: EF 60-65, normal wall motion, mild AI, moderate BAE, ascending aorta 43 mm, aortic root 39 mm  . BPH (benign prostatic hypertrophy)   . Chronic anticoagulation   . Chronic atrial fibrillation   . Diastolic dysfunction October 2010   Normal LV  systolic function  . Hx SBO   . Hyperlipidemia   . Hypertension   . Patellar tendon rupture, left, initial encounter 02/10/2019  . Small bowel obstruction [facility])     Past Surgical History:  Procedure Laterality Date  . APPENDECTOMY  2004  . APPLICATION OF WOUND VAC Left 02/11/2019   Procedure: Application Of Wound Vac;  Surgeon: Altamese Burt, MD;  Location: [facility];  Service: Orthopedics;  Laterality: Left;  . CARDIOVASCULAR STRESS TEST  09/30/2007   EF 67%  No ischemia.   Marland Kitchen CARDIOVERSION  05/01/2004  . KNEE ARTHROSCOPY Left 02/11/2019   Procedure: ARTHROSCOPY LEFT KNEE;  Surgeon: Altamese Sumner, MD;  Location: [facility];  Service: Orthopedics;  Laterality: Left;  . PATELLAR TENDON REPAIR Left 02/11/2019   Procedure: LEFT PATELLA TENDON REPAIR;  Surgeon: Altamese Bull Run, MD;  Location: [facility];  Service: Orthopedics;  Laterality: Left;  . SMALL INTESTINE SURGERY  2004  . US ECHOCARDIOGRAPHY  09/05/2009   ef 55-60%    Social History:  reports that he has never smoked. He has never used smokeless tobacco. He reports that he does not drink alcohol or use drugs.  Allergies: No Known Allergies  Medications Prior to Admission  Medication Sig Dispense Refill  . acetaminophen (TYLENOL) 325 MG tablet Take 2 tablets (650 mg total) by mouth every 6 (six) hours as needed for mild pain, fever or headache.    . Cholecalciferol (VITAMIN D-3) 25 MCG (1000 UT) CAPS Take 1,000 Units by mouth 2 (two) times a day.    . colchicine 0.6 MG tablet Take 1 tablet (0.6 mg total) by mouth 2 (two) times daily. 15 tablet 0  . Cyanocobalamin (B-12 COMPLIANCE INJECTION) 1000 MCG/ML KIT Inject 1,000 mcg as directed every 30 (thirty) days.    Marland Kitchen diltiazem (CARDIZEM CD) 120 MG 24 hr capsule Take 1 capsule (120 mg total) by mouth daily. 30 capsule 11  . docusate sodium (COLACE) 100 MG capsule Take 1 capsule (100 mg total) by mouth 2 (two) times daily. (Patient taking differently: Take 100 mg by mouth every other day. ) 10 capsule 0   . donepezil (ARICEPT) 10 MG tablet Take 1 tablet (10 mg total) by mouth daily. (Patient taking differently: Take 10 mg by mouth at bedtime. ) 90 tablet 3  . feeding supplement, ENSURE ENLIVE, (ENSURE ENLIVE) LIQD Take 237 mLs by mouth 2 (two) times daily between meals. (Patient taking differently: Take 237 mLs by mouth 2 (two) times daily as needed (for supplementation). CHOCOLATE) 237 mL 12  . finasteride (PROSCAR) 5 MG tablet Take 1 tablet (5 mg total) by mouth every morning. 30 tablet 0  . lithium carbonate 150 MG capsule TAKE 1 CAPSULE(150 MG) BY MOUTH AT BEDTIME (Patient taking differently: Take 150 mg by mouth at bedtime. ) 90 capsule 0  . Multiple Vitamins-Minerals (ICAPS AREDS 2 PO) Take 1 capsule by mouth daily.     . Omega-3 Fatty Acids (OMEGA-3 FISH OIL PO) Take 1 capsule by mouth daily with breakfast.     . polyethylene glycol powder (GLYCOLAX/MIRALAX) 17 GM/SCOOP powder Take 17 g by mouth daily as needed for mild constipation.    Marland Kitchen warfarin (COUMADIN) 2.5 MG tablet Take 1 tablet (2.5 mg total) by mouth daily. (Patient taking differently: Take 2.5 mg by mouth daily at 6 PM. ) 30 tablet 0  . allopurinol (ZYLOPRIM) 100 MG tablet Take 1 tablet (100 mg total) by mouth daily. (Patient not taking: Reported on 05/05/2019) 30 tablet 1  . HYDROcodone-acetaminophen (NORCO/VICODIN) 5-325 MG tablet Take 1-2 tablets by mouth every 4 (four) hours as needed for moderate pain (pain score 4-6). (Patient not taking: Reported on 05/05/2019) 20 tablet 0     Physical Exam: Blood pressure 125/90, pulse (!) 120, temperature (!) 97.5 F (36.4 C), temperature source Oral, resp. rate (!) 23, height 6' (1.829 m), weight 82.1 kg, SpO2 97 %. General: pleasant, WD, WN white male who is laying in bed in NAD, but clearly disoriented when talking to him HEENT: head is normocephalic, atraumatic.  Sclera are noninjected.  PERRL.  Ears and nose without any masses or lesions.  Mouth is pink and moist Heart: irregular and  tachy.  Normal s1,s2. No obvious murmurs, gallops, or rubs noted.  Palpable radial and pedal pulses bilaterally Lungs: CTAB, no wheezes, rhonchi, or rales noted.  Respiratory effort nonlabored Abd: soft, NT, mild bloating, +BS, no masses, hernias, or organomegaly, midline laparotomy scar noted MS: all 4 extremities are symmetrical with no cyanosis, clubbing, or edema. Skin: warm and dry with no masses, lesions, or rashes Psych: Alert, oriented to year and self.  Knows day and month of bday but not year, doesn't know place.  Results for orders placed or performed during the hospital encounter of 05/05/19 (from the past 48 hour(s))  Troponin I (High Sensitivity)     Status: None   Collection Time: 05/05/19  5:07 PM  Result Value Ref Range   Troponin I (High Sensitivity) <2 <18 ng/L    Comment: Performed at [facility] [address] [place], [place] [address]  CBC     Status: Abnormal   Collection Time: 05/05/19  5:07 PM  Result Value Ref Range   WBC 17.0 (H) 4.0 - 10.5 K/uL   RBC 4.87 4.22 - 5.81 MIL/uL   Hemoglobin 12.6 (L) 13.0 - 17.0 g/dL   HCT 40.9 39.0 - 52.0 %   MCV 84.0 80.0 - 100.0 fL   MCH 25.9 (L) 26.0 - 34.0 pg   MCHC 30.8 30.0 - 36.0 g/dL   RDW 19.3 (H) 11.5 - 15.5 %   Platelets 370 150 - 400 K/uL   nRBC 0.0 0.0 - 0.2 %    Comment: Performed at [facility] [address], [place], [place] [address]  Comprehensive metabolic panel     Status: Abnormal   Collection Time: 05/05/19  5:07 PM  Result Value Ref Range   Sodium 139 135 - 145 mmol/L   Potassium 4.8 3.5 - 5.1 mmol/L   Chloride 106 98 - 111 mmol/L   CO2 24 22 - 32 mmol/L   Glucose, Bld 140 (H) 70 - 99 mg/dL   BUN 17 8 - 23 mg/dL   Creatinine, Ser 1.56 (H) 0.61 - 1.24 mg/dL   Calcium 10.0 8.9 - 10.3 mg/dL   Total Protein 8.6 (H) 6.5 - 8.1 g/dL   Albumin 3.4 (L) 3.5 - 5.0 g/dL   AST 17 15 - 41 U/L   ALT 14 0 - 44 U/L   Alkaline Phosphatase 109 38 - 126 U/L   Total Bilirubin 0.3 0.3 - 1.2  mg/dL   GFR calc non Af Amer 43 (L) >60 mL/min   GFR calc Af Amer 50 (L) >60 mL/min   Anion gap 9 5 - 15    Comment: Performed at [facility] [address], [place], [place] [address]  Magnesium     Status: None   Collection Time: 05/05/19  5:07 PM  Result Value Ref Range   Magnesium 1.8 1.7 - 2.4 mg/dL    Comment: Performed at [facility], [place] [address] [place], [place] [address]  Lipase, blood     Status: None   Collection Time: 05/05/19  5:07 PM  Result Value Ref Range   Lipase 24 11 - 51 U/L    Comment: Performed at [facility] [address], [place], [place] [address]  Protime-INR     Status: Abnormal   Collection Time: 05/05/19  5:07 PM  Result Value Ref Range   Prothrombin Time 24.4 (H) 11.4 - 15.2 seconds   INR 2.2 (H) 0.8 - 1.2    Comment: (NOTE) INR goal varies based on device and disease states. Performed at [facility], [place] [address] [place], [place] [address]   CBG monitoring, ED     Status: Abnormal   Collection Time: 05/05/19  5:11 PM  Result Value Ref Range   Glucose-Capillary 129 (H) 70 - 99 mg/dL  Lactic acid, plasma     Status: Abnormal   Collection Time: 05/05/19  5:12 PM  Result Value Ref Range   Lactic Acid, Venous  3.6 (HH) 0.5 - 1.9 mmol/L    Comment: CRITICAL RESULT CALLED TO, READ BACK BY AND VERIFIED WITH: Lawerance Bach  1805 05/05/2019 WBOND Performed at [facility], [place] [address], [place], [place] [address]   Lactic acid, plasma     Status: Abnormal   Collection Time: 05/05/19  6:04 PM  Result Value Ref Range   Lactic Acid, Venous 2.0 (HH) 0.5 - 1.9 mmol/L    Comment: CRITICAL RESULT CALLED TO, READ BACK BY AND VERIFIED WITH: [facility] RN 1908 05/05/2019 MCCORMICK K Performed at [facility], [place] [address] [place], [place] [address]   Blood Culture (routine x 2)     Status: None (Preliminary result)   Collection Time: 05/05/19  6:39 PM   Specimen: BLOOD  Result Value Ref Range   Specimen  Description BLOOD RIGHT ANTECUBITAL    Special Requests      BOTTLES DRAWN AEROBIC AND ANAEROBIC Blood Culture results may not be optimal due to an inadequate volume of blood received in culture bottles   Culture      NO GROWTH < 24 HOURS Performed at [facility] [address], [place], [place] [address]    Report Status PENDING   Blood Culture (routine x 2)     Status: None (Preliminary result)   Collection Time: 05/05/19  6:44 PM   Specimen: BLOOD  Result Value Ref Range   Specimen Description BLOOD BLOOD LEFT FOREARM    Special Requests      BOTTLES DRAWN AEROBIC ONLY Blood Culture adequate volume   Culture      NO GROWTH < 12 HOURS Performed at [facility], [place] [address] [place], [place] [address]    Report Status PENDING   Urinalysis, Routine w reflex microscopic     Status: Abnormal   Collection Time: 05/05/19  7:10 PM  Result Value Ref Range   Color, Urine YELLOW YELLOW   APPearance HAZY (A) CLEAR   Specific Gravity, Urine 1.014 1.005 - 1.030   pH 5.0 5.0 - 8.0   Glucose, UA NEGATIVE NEGATIVE mg/dL   Hgb urine dipstick NEGATIVE NEGATIVE   Bilirubin Urine NEGATIVE NEGATIVE   Ketones, ur NEGATIVE NEGATIVE mg/dL   Protein, ur NEGATIVE NEGATIVE mg/dL   Nitrite NEGATIVE NEGATIVE   Leukocytes,Ua LARGE (A) NEGATIVE   RBC / HPF 0-5 0 - 5 RBC/hpf   WBC, UA 11-20 0 - 5 WBC/hpf   Bacteria, UA FEW (A) NONE SEEN   Squamous Epithelial / LPF 0-5 0 - 5   Mucus PRESENT    Hyaline Casts, UA PRESENT    Ca Oxalate Crys, UA PRESENT     Comment: Performed at [facility], [place] [address], [place], [place] [address]  SARS Coronavirus 2 (CEPHEID - Performed in [facility] hospital lab), Hosp Order     Status: None   Collection Time: 05/05/19  7:13 PM   Specimen: Nasopharyngeal Swab  Result Value Ref Range   SARS Coronavirus 2 NEGATIVE NEGATIVE    Comment: (NOTE) If result is NEGATIVE SARS-CoV-2 target nucleic acids are NOT DETECTED. The SARS-CoV-2 RNA is  generally detectable in upper and lower  respiratory specimens during the acute phase of infection. The lowest  concentration of SARS-CoV-2 viral copies this assay can detect is 250  copies / mL. A negative result does not preclude SARS-CoV-2 infection  and should not be used as the sole basis for treatment or other  patient management decisions.  A negative result may occur with  improper specimen collection / handling, submission of specimen other  than nasopharyngeal swab, presence of viral mutation(s) within the  areas targeted by this assay, and inadequate number of viral copies  (<250 copies / mL). A negative result must be combined with clinical  observations, patient history, and epidemiological information. If result is POSITIVE SARS-CoV-2 target nucleic acids are DETECTED. The SARS-CoV-2 RNA is generally detectable in upper and lower  respiratory specimens dur ing the acute phase of infection.  Positive  results are indicative of active infection with SARS-CoV-2.  Clinical  correlation with patient history and other diagnostic information is  necessary to determine patient infection status.  Positive results do  not rule out bacterial infection or co-infection with other viruses. If result is PRESUMPTIVE POSTIVE SARS-CoV-2 nucleic acids MAY BE PRESENT.   A presumptive positive result was obtained on the submitted specimen  and confirmed on repeat testing.  While 2019 novel coronavirus  (SARS-CoV-2) nucleic acids may be present in the submitted sample  additional confirmatory testing may be necessary for epidemiological  and / or clinical management purposes  to differentiate between  SARS-CoV-2 and other Sarbecovirus currently known to infect humans.  If clinically indicated additional testing with an alternate test  methodology [phone]) is advised. The SARS-CoV-2 RNA is generally  detectable in upper and lower respiratory sp ecimens during the acute  phase of infection.  The expected result is Negative. Fact Sheet for Patients:  StrictlyIdeas.no Fact Sheet for Healthcare Providers: BankingDealers.co.za This test is not yet approved or cleared by the [place] FDA and has been authorized for detection and/or diagnosis of SARS-CoV-2 by FDA under an Emergency Use Authorization (EUA).  This EUA will remain in effect (meaning this test can be used) for the duration of the COVID-19 declaration under Section 564(b)(1) of the Act, 21 U.S.C. section 360bbb-3(b)(1), unless the authorization is terminated or revoked sooner. Performed at [facility], [place] [address], [place], [place] [address]   Lactic acid, plasma     Status: Abnormal   Collection Time: 05/05/19 10:41 PM  Result Value Ref Range   Lactic Acid, Venous 3.0 (HH) 0.5 - 1.9 mmol/L    Comment: CRITICAL RESULT CALLED TO, READ BACK BY AND VERIFIED WITH: PIVERGER,R RN 05/05/2019 2339 JORDANS Performed at [facility], [place] [address] [place], [place] [address]   CBC     Status: Abnormal   Collection Time: 05/06/19  2:00 AM  Result Value Ref Range   WBC 14.6 (H) 4.0 - 10.5 K/uL   RBC 4.40 4.22 - 5.81 MIL/uL   Hemoglobin 11.3 (L) 13.0 - 17.0 g/dL   HCT 36.2 (L) 39.0 - 52.0 %   MCV 82.3 80.0 - 100.0 fL   MCH 25.7 (L) 26.0 - 34.0 pg   MCHC 31.2 30.0 - 36.0 g/dL   RDW 19.5 (H) 11.5 - 15.5 %   Platelets 371 150 - 400 K/uL   nRBC 0.0 0.0 - 0.2 %    Comment: Performed at [facility] [address] [place], [place] [address]  Basic metabolic panel     Status: Abnormal   Collection Time: 05/06/19  2:00 AM  Result Value Ref Range   Sodium 139 135 - 145 mmol/L   Potassium 4.8 3.5 - 5.1 mmol/L   Chloride 107 98 - 111 mmol/L   CO2 23 22 - 32 mmol/L   Glucose, Bld 139 (H) 70 - 99 mg/dL  BUN 18 8 - 23 mg/dL   Creatinine, Ser 1.19 0.61 - 1.24 mg/dL   Calcium 9.3 8.9 - 10.3 mg/dL   GFR calc non Af Amer 60 (L) >60 mL/min   GFR calc Af  Amer >60 >60 mL/min   Anion gap 9 5 - 15    Comment: Performed at [facility] [address], [place], [place] [address]  Protime-INR     Status: Abnormal   Collection Time: 05/06/19  2:00 AM  Result Value Ref Range   Prothrombin Time 30.2 (H) 11.4 - 15.2 seconds   INR 2.9 (H) 0.8 - 1.2    Comment: (NOTE) INR goal varies based on device and disease states. Performed at [facility], [place] [address] [place], [place] [address]   Lactic acid, plasma     Status: None   Collection Time: 05/06/19  2:00 AM  Result Value Ref Range   Lactic Acid, Venous 1.4 0.5 - 1.9 mmol/L    Comment: Performed at [facility] [address] [place], [place] [address]   Abd 1 View (kub)  Result Date: 05/06/2019 CLINICAL DATA:  Small bowel obstruction. EXAM: ABDOMEN - 1 VIEW COMPARISON:  CT abdomen and pelvis 05/05/2019 FINDINGS: Again noted is severe gaseous distension of small bowel and compatible with a high-grade obstruction. The degree of small bowel distension is similar to slightly increased. Limited evaluation for free air on this supine image. Focal sclerosis in the right ilium is compatible with a bone island. There is contrast within the urinary bladder. IMPRESSION: Persistent dilated loops of small bowel compatible with a high-grade small bowel obstruction. Degree of small bowel distension is stable to slightly increased from the recent CT. Electronically Signed   By: Markus Daft M.D.   On: 05/06/2019 09:19   Ct Abdomen Pelvis W Contrast  Result Date: 05/05/2019 CLINICAL DATA:  73 year old male with prior history of small-bowel surgery presenting with abdominal pain. Concern for bowel obstruction. EXAM: CT ABDOMEN AND PELVIS WITH CONTRAST TECHNIQUE: Multidetector CT imaging of the abdomen and pelvis was performed using the standard protocol following bolus administration of intravenous contrast. CONTRAST:  167m OMNIPAQUE IOHEXOL 300 MG/ML  SOLN COMPARISON:  CT of the abdomen pelvis  dated 02/08/2019 and chest CT dated 07/08/2013 FINDINGS: Lower chest: Bibasilar linear atelectasis/scarring. The visualized lung bases are otherwise clear. Multi vessel coronary vascular calcification. No intra-abdominal free air. Small fluid in the mesentery in the left hemiabdomen. Hepatobiliary: Small faint low attenuating focus in the right lobe of the liver adjacent the gallbladder (series 3, image 35) is not characterized but may represent a focal area of fatty infiltration. The liver is otherwise unremarkable. Multiple stones noted in the neck of the gallbladder. No pericholecystic fluid. Pancreas: Unremarkable. No pancreatic ductal dilatation or surrounding inflammatory changes. Spleen: Normal in size without focal abnormality. Adrenals/Urinary Tract: The adrenal glands are unremarkable. Mild bilateral renal parenchyma atrophy. There is no hydronephrosis on either side. Symmetric enhancement and excretion of contrast by both kidneys. The visualized ureters appear unremarkable. Small right bladder diverticula and slight trabeculation of the bladder wall, likely related to chronic bladder outlet obstruction. Stomach/Bowel: There is a small hiatal hernia. There is dilatation of multiple fluid-filled loops of small bowel measuring up to 7.5 cm in caliber. The distal small bowel and terminal ileum are collapsed. A transition point is noted in the left hemiabdomen (series 3, image 53 and coronal series 6, image 46) in similar location as the CT of  07/08/2013. This is most likely related to underlying adhesions. An internal hernia is favored less likely. Clinical correlation is recommended. There is no pneumatosis. The colon is unremarkable as visualized. Appendectomy. Vascular/Lymphatic: Moderate aortoiliac atherosclerotic disease. No portal venous gas. There is no adenopathy. Reproductive: Enlarged prostate gland measuring 7.7 cm in transverse axial diameter. Other: Midline anterior abdominal wall surgical scar.  Musculoskeletal: Osteopenia with degenerative changes of the spine. Probable right iliac bone island. Bilateral L5 pars defects with grade 1 L5-S1 anterolisthesis. No acute osseous pathology. IMPRESSION: 1. Small-bowel obstruction with transition zone in the left hemiabdomen similar in location as the CT of 07/08/2013 and likely related to underlying adhesions. Clinical correlation is recommended. No pneumatosis or portal venous gas. 2. Cholelithiasis. Electronically Signed   By: Anner Crete M.D.   On: 05/05/2019 20:01   Dg Chest Portable 1 View  Result Date: 05/05/2019 CLINICAL DATA:  History of dementia.  Epigastric pain. EXAM: PORTABLE CHEST 1 VIEW COMPARISON:  Chest x-ray 03/23/2019.  CT 02/08/2019 FINDINGS: Mediastinum hilar structures normal. Stable cardiomegaly. No pulmonary venous congestion. Mild bibasilar atelectasis. Stable elevation left hemidiaphragm. Interposition of colon under the left hemidiaphragm. Similar findings noted on prior exam. Gastric and or colonic distention cannot be excluded. Abdominal series suggested for further evaluation. Degenerative change thoracic spine. IMPRESSION: 1. Mild bibasilar atelectasis and stable elevation left hemidiaphragm. No acute infiltrate. Stable cardiomegaly. No pulmonary venous congestion. 2. Interposition of colon under the left hemidiaphragm. Similar finding noted on prior exam. Gastric and or colonic distention cannot be excluded. Abdominal series suggested for further evaluation. Electronically Signed   By: Marcello Moores  Register   On: 05/05/2019 16:57      Assessment/Plan UTI A fib on coumadin, INR 3 Dementia HTN AV regurg  SBO The patient appears to have a small bowel obstruction on CT scan as well as plain film follow up.  We would like to do the SBO protocol, but are hesitant with an INR of 3 to place an NGT as to avoid a bad epistaxis.  We will still order the protocol for him to drink his contrast as he denies nausea, but more  importantly has not been found to have vomiting.  We would recommend holding his coumadin incase he were to need surgical intervention.  It is possible this is an ileus reactive to his UTI, but he has had prior abdominal surgery and does appear to have a transition zone on his CT scan.  Either way, we will proceed with bowel rest and modified SBO protocol.  If he starts vomiting, he will need an NGT.  We will follow along.  FEN - NPO VTE - hold coumadin ID -  [email or web address]  Henreitta Cea, [facility] Surgery 05/06/2019, 10:56 AM Pager: [phone]

## 2019-05-06 NOTE — Progress Notes (Signed)
PROGRESS NOTE  Russell Dillon OVZ:[id number] DOB: 1946/04/20 DOA: 05/05/2019 PCP: Russell Redwood, MD  Brief History   The patient is lying in bed. He is very confused.  Consultants  . General Surgery  Procedures  . None  Antibiotics  . Cefepime  Subjective  The patient is awake, confused, disoriented. No new complaints. [place]  Objective   Vitals:  Vitals:   05/06/19 0400 05/06/19 0922  BP: 125/80 125/90  Pulse: (!) 101 (!) 120  Resp:    Temp:    SpO2: 100% 97%    Exam:  Constitutional:  . The patient is awake alert and confused. No acute distress. Respiratory:  . No increased work of breathing  . No wheezes, rales, or rhonchi.  . No tactile fremitus Cardiovascular:  . Regular rate and rhythm. . No murmurs ectopy of gallups. . No lateral PMI. No thrills.   Abdomen:  . Abdomen is distended, but not taut. Positive for tenderness with palpations. . No hernias, masses, or organomegaly are appreciated. Marland Kitchen Hypoactive bowel sounds. Musculoskeletal:  . No cyanosis, clubbing, or edema. Skin:  . No rashes, lesions, ulcers . palpation of skin: no induration or nodules Neurologic: Unable to evaluate due to the patient's confusion and inability to participate with exam. Psychiatric:  Unable to evaluate due to the patient's confusion and inability to participate with exam.  I have personally reviewed the following:   Today's Data  . BMP, CBC  Imaging  . 1 view abdomen: Dilated loops of bowel and transition zone compatible with a high grade obstruction. .   Scheduled Meds: . diltiazem  120 mg Oral Daily  . diltiazem  5 mg Intravenous Once  . sodium chloride flush  3 mL Intravenous Q12H   Continuous Infusions: . sodium chloride 100 mL/hr at 05/06/19 1425  . ceFEPime (MAXIPIME) IV 2 g (05/06/19 0911)  . diltiazem (CARDIZEM) infusion Stopped (05/05/19 2340)    Principal Problem:   Sepsis secondary to UTI [facility]) Active Problems:    Atrial fibrillation, chronic   Long term (current) use of anticoagulants   SBO (small bowel obstruction) (HCC)   Dementia without behavioral disturbance (HCC)   AKI (acute kidney injury) ([place])   Transient hypotension   LOS: 1 day    A & P   Sepsis 2/2 UTI : Patient presents tachycardic, WBC 17, and lactic acid elevated at 3.6.  Urinalysis concerning for possible source of infection.  Although symptoms could be stemming from SBO.  Patient had been empirically given antibiotics of vancomycin, cefepime, and metronidazole.  During last hospitalization in May patient was noted to be positive for Russell Dillon that was resistant to ciprofloxacin and ampicillin/sulbactam, but sensitive to Rocephin.  the patient was admitted to a medical telemetry bed. Blood and urine cultures have been obtained. He will be placed on empiric cefepime pending culture results. Lactic acid was trended.  Small bowel obstruction: Acute.  Patient complained of abdominal pain prior to arrival and had one episode of vomiting.  CT scan shows small bowel obstruction with transition trauma in the mid left hemiabdomen.  Risk factors include previous history of small bowel obstruction related with adhesions. The patient remains NPO. General surgery has seen the patient. As the patient's INR remains 3, they are holding off on placing NGT. Coumadin has now been held and the patient is on a heparin drip pending possible surgical intervention. Continue IV fluids and NPO status.  Transient hypotension: Resolving.  Blood  pressures currently appears stable.  Patient noted to be hypotensive prior to arrival. IV fluids as seen above bolus IV fluids as needed.  Acute kidney injury: Patient baseline creatinine noted to be 0.7.  He presents with creatinine elevated to 1.56 with BUN 17. Improved after IV fluids. Suspect secondary to above. V fluids as seen above. Monitor creatinine, electrolytes, and volume status.  Atrial fibrillation on  anticoagulation: Chronic.  INR currently supertherapeutic at 3.1. CHA2DS2-VASc score = at least 3. Warfarin held pending possible necessary surgical intervention. Heparin drip per pharmacy as patient is n.p.o. Diltiazem drip as needed  Dementia with behavioral disturbance: Patient on medications of Aricept at home.  Wife notes that he has been more confused.  I have seen and examined this patient myself. I have spent 35 minutes in his evaluation and care.  DVT prophylaxis: Heparin Code Status: Full Family Communication: Discussed plan of care with the patient's wife who is at the patient's bedside this morning. Disposition Plan: Possible discharge home in 2 to 3 days  Russell Howze, DO [facility] Direct contact: see [email or web address]  7PM-7AM contact night coverage as above 05/06/2019, 4:39 PM  LOS: 1 day

## 2019-05-07 ENCOUNTER — Inpatient Hospital Stay (HOSPITAL_COMMUNITY): Payer: Medicare Other

## 2019-05-07 LAB — BASIC METABOLIC PANEL
Anion gap: 10 (ref 5–15)
BUN: 17 mg/dL (ref 8–23)
CO2: 21 mmol/L — ABNORMAL LOW (ref 22–32)
Calcium: 9.6 mg/dL (ref 8.9–10.3)
Chloride: 110 mmol/L (ref 98–111)
Creatinine, Ser: 0.97 mg/dL (ref 0.61–1.24)
GFR calc Af Amer: >60 mL/min (ref >60)
GFR calc non Af Amer: >60 mL/min (ref >60)
Glucose, Bld: 135 mg/dL — ABNORMAL HIGH (ref 70–99)
Potassium: 3.8 mmol/L (ref 3.5–5.1)
Sodium: 141 mmol/L (ref 135–145)

## 2019-05-07 LAB — CBC WITH DIFFERENTIAL/PLATELET
Abs Immature Granulocytes: 0.06 10*3/uL (ref 0.00–0.07)
Basophils Absolute: 0 10*3/uL (ref 0.0–0.1)
Basophils Relative: 0 %
Eosinophils Absolute: 0 10*3/uL (ref 0.0–0.5)
Eosinophils Relative: 0 %
HCT: 36.6 % — ABNORMAL LOW (ref 39.0–52.0)
Hemoglobin: 11.4 g/dL — ABNORMAL LOW (ref 13.0–17.0)
Immature Granulocytes: 0 %
Lymphocytes Relative: 5 %
Lymphs Abs: 0.7 10*3/uL (ref 0.7–4.0)
MCH: 25.7 pg — ABNORMAL LOW (ref 26.0–34.0)
MCHC: 31.1 g/dL (ref 30.0–36.0)
MCV: 82.6 fL (ref 80.0–100.0)
Monocytes Absolute: 0.4 10*3/uL (ref 0.1–1.0)
Monocytes Relative: 3 %
Neutro Abs: 12.7 10*3/uL — ABNORMAL HIGH (ref 1.7–7.7)
Neutrophils Relative %: 92 %
Platelets: 343 10*3/uL (ref 150–400)
RBC: 4.43 MIL/uL (ref 4.22–5.81)
RDW: 19.5 % — ABNORMAL HIGH (ref 11.5–15.5)
WBC: 13.9 10*3/uL — ABNORMAL HIGH (ref 4.0–10.5)
nRBC: 0 % (ref 0.0–0.2)

## 2019-05-07 LAB — PROTIME-INR
INR: 2.8 — ABNORMAL HIGH (ref 0.8–1.2)
INR: 2.3 — ABNORMAL HIGH (ref 0.8–1.2)
Prothrombin Time: 29.1 seconds — ABNORMAL HIGH (ref 11.4–15.2)
Prothrombin Time: 24.5 seconds — ABNORMAL HIGH (ref 11.4–15.2)

## 2019-05-07 LAB — TYPE AND SCREEN
ABO/RH(D): A POS
Antibody Screen: NEGATIVE

## 2019-05-07 MED ORDER — SODIUM CHLORIDE 0.9% IV SOLUTION
Freq: Once | INTRAVENOUS | Status: AC
  Administered 2019-05-08: 04:00:00 via INTRAVENOUS

## 2019-05-07 MED ORDER — METOPROLOL TARTRATE 5 MG/5ML IV SOLN
5.0000 mg | Freq: Once | INTRAVENOUS | Status: AC
  Administered 2019-05-07: 07:00:00 5 mg via INTRAVENOUS
  Filled 2019-05-07: qty 5

## 2019-05-07 MED ORDER — SODIUM CHLORIDE 0.9% IV SOLUTION: Freq: Once | INTRAVENOUS | Status: DC

## 2019-05-07 MED ORDER — DILTIAZEM HCL-DEXTROSE 100-5 MG/100ML-% IV SOLN (PREMIX)
5.0000 mg/h | INTRAVENOUS | Status: DC
  Administered 2019-05-07 – 2019-05-09 (×4): 5 mg/h via INTRAVENOUS
  Administered 2019-05-10 – 2019-05-11 (×4): 10 mg/h via INTRAVENOUS
  Administered 2019-05-12: 7.5 mg/h via INTRAVENOUS
  Administered 2019-05-12: 5 mg/h via INTRAVENOUS
  Filled 2019-05-07 (×10): qty 100

## 2019-05-07 MED ORDER — MIDAZOLAM HCL 2 MG/2ML IJ SOLN
2.0000 mg | Freq: Once | INTRAMUSCULAR | Status: AC
  Administered 2019-05-07: 2 mg via INTRAVENOUS
  Filled 2019-05-07: qty 2

## 2019-05-07 MED ORDER — DIATRIZOATE MEGLUMINE & SODIUM 66-10 % PO SOLN
90.0000 mL | Freq: Once | ORAL | Status: AC
  Administered 2019-05-07: 19:00:00 90 mL via NASOGASTRIC
  Filled 2019-05-07: qty 90

## 2019-05-07 NOTE — Progress Notes (Signed)
    CC: Nominal pain and cramping, increased weakness, altered mental status  Subjective: Some nausea, abdomen is distended is not overly tender.  No vomiting.  Objective: Vital signs in last 24 hours: Temp:  [98.2 F (36.8 C)-98.3 F (36.8 C)] 98.3 F (36.8 C) (06/26 0743) Pulse Rate:  [100-120] 100 (06/26 0743) Resp:  [12-27] 21 (06/26 0743) BP: (117-132)/(81-100) 122/91 (06/26 0743) SpO2:  [95 %-99 %] 95 % (06/26 0743) Last BM Date: 05/06/19 80 p.o. 867 IV Urine 1450 BM recorded. Afebrile, hypertensive,, tachycardic Potassium 3.8, glucose 135, WBC 13.9. INR 2.8 Small bowel protocol 8-hour film shows persistent high-grade obstruction with no convincing contrast within the colon. Intake/Output from previous day: 06/25 0701 - 06/26 0700 In: 947.9 [P.O.:80; I.V.:767.9; IV Piggyback:100] Out: 1450 [Urine:1450] Intake/Output this shift: No intake/output data recorded.  General appearance: alert, cooperative, no distress and He is uncomfortable but in no distress. Resp: clear to auscultation bilaterally and Anterior exam. GI: Soft but still distended.  No acute tenderness.  No bowel sounds is a small midline surgical scar at the umbilicus.  Lab Results:  Recent Labs    05/06/19 0200 05/07/19 0248  WBC 14.6* 13.9*  HGB 11.3* 11.4*  HCT 36.2* 36.6*  PLT 371 343    BMET Recent Labs    05/06/19 0200 05/07/19 0248  NA 139 141  K 4.8 3.8  CL 107 110  CO2 23 21*  GLUCOSE 139* 135*  BUN 18 17  CREATININE 1.19 0.97  CALCIUM 9.3 9.6   PT/INR Recent Labs    05/06/19 0200 05/07/19 0248  LABPROT 30.2* 29.1*  INR 2.9* 2.8*    Recent Labs  Lab 05/05/19 1707  AST 17  ALT 14  ALKPHOS 109  BILITOT 0.3  PROT 8.6*  ALBUMIN 3.4*     Lipase     Component Value Date/Time   LIPASE 24 05/05/2019 1707     Medications: . diltiazem  120 mg Oral Daily  . diltiazem  5 mg Intravenous Once  . sodium chloride flush  3 mL Intravenous Q12H   . sodium chloride  100 mL/hr at 05/07/19 0135  . ceFEPime (MAXIPIME) IV 2 g (05/07/19 0816)   Assessment/Plan UTI - 80,000 COLONIES/mL DIPHTHEROIDS(CORYNEBACTERIUM SPECIES A fib on coumadin, INR 3 Dementia HTN AV regurg COVID negative - 05/05/2019  SBO -Hx small bowel obstruction 03/01/19/small bowel resection 2004, Hx appendectomy 2004   -Small bowel protocol No contrast noted on the film, still very distended and stomach full of air  FEN - NPO VTE - hold coumadin ID -  maxipime DVT:  INR 2.8 POC: Hilmes,[name] Spouse [phone]  [phone]  MOORFIELD-LANG,[name] Daughter   [phone]    Plan: Patient needs his Coumadin reversed, he needs an NG tube for decompression. I have discussed with  DR. Swayze.  She is going to get some Vitamin K+ and FFP then insert NG. Will continue to follow and recheck film in AM.     LOS: 2 days    Maliyah Willets 05/07/2019 [phone]

## 2019-05-07 NOTE — Progress Notes (Signed)
PROGRESS NOTE  Russell Dillon EVO:[id number] DOB: 05/05/1946 DOA: 05/05/2019 PCP: Marton Redwood, MD  Brief History   The patient is lying in bed. He is very confused.  Consultants  . General Surgery  Procedures  . None  Antibiotics  . Cefepime  Subjective  The patient is sleeping upon my visit. He has had emesis x 1 and has been tachycardic with HR in the 150's. Diltiazem drip started.  Objective   Vitals:  Vitals:   05/07/19 1354 05/07/19 1355  BP: 123/75 125/79  Pulse: 99 97  Resp: (!) 21 13  Temp:    SpO2: 97% 95%    Exam:  Constitutional:  . The patient is sleeping. He is not awakened. a No acute distress. Respiratory:  . No increased work of breathing  . No wheezes, rales, or rhonchi.  . No tactile fremitus Cardiovascular:  . Regular rate and rhythm. . No murmurs ectopy of gallups. . No lateral PMI. No thrills.   Abdomen:  . Abdomen is quite distended, taut. Tenderness not elicited with palpation, but patient is sleeping. . No hernias, masses, or organomegaly are appreciated. Marland Kitchen Hypoactive bowel sounds. Musculoskeletal:  . No cyanosis, clubbing, or edema. Skin:  . No rashes, lesions, ulcers . palpation of skin: no induration or nodules Neurologic: Unable to evaluate due to the patient's inability to participate with exam. Psychiatric:  Unable to evaluate due to the patient's inability to participate with exam.  I have personally reviewed the following:   Today's Data  . BMP, CBC, Vitals, PT/INR  Imaging  . 1 view abdomen: Dilated loops of bowel, stomach,and transition zone compatible with a high grade obstruction. .   Scheduled Meds: . sodium chloride   Intravenous Once  . diltiazem  5 mg Intravenous Once  . sodium chloride flush  3 mL Intravenous Q12H   Continuous Infusions: . sodium chloride 100 mL/hr at 05/07/19 1151  . ceFEPime (MAXIPIME) IV 2 g (05/07/19 0816)  . diltiazem (CARDIZEM) infusion 5 mg/hr (05/07/19 1300)    Principal  Problem:   Sepsis secondary to UTI [facility]) Active Problems:   Atrial fibrillation, chronic   Long term (current) use of anticoagulants   SBO (small bowel obstruction) (HCC)   Dementia without behavioral disturbance (HCC)   AKI (acute kidney injury) ([place])   Transient hypotension   LOS: 2 days    A & P   Sepsis 2/2 UTI : Patient presents tachycardic, WBC 17, and lactic acid elevated at 3.6.  Urinalysis concerning for possible source of infection.  Although symptoms could be stemming from SBO.  Patient had been empirically given antibiotics of vancomycin, cefepime, and metronidazole.  During last hospitalization in May patient was noted to be positive for E. coli that was resistant to ciprofloxacin and ampicillin/sulbactam, but sensitive to Rocephin.  the patient was admitted to a medical telemetry bed. Blood and urine cultures have been obtained. He will be placed on empiric cefepime pending culture results. Lactic acid was trended.  Small bowel obstruction: Acute.  Patient complained of abdominal pain prior to arrival and had one episode of vomiting.  CT scan shows small bowel obstruction with transition trauma in the mid left hemiabdomen.  Risk factors include previous history of small bowel obstruction related with adhesions. The patient remains NPO. General surgery has seen the patient. As the patient's INR remains 3, they are holding off on placing NGT. Coumadin has now been held and the patient is on a heparin drip pending possible surgical intervention. Continue  IV fluids and NPO status. I have discussed the patient with surgery. NGT needs to be placed to decompress the bowel, but patient's INR is 2.8. Will give 2 units of FFP, and repeat INR. Heparin drip can be started once NGT in place and stable. I have discussed this plan with the patient's wife. She is in agreement.  Transient hypotension: Resolving.  Blood pressures currently appears stable.  Patient noted to be hypotensive prior to  arrival. IV fluids as seen above bolus IV fluids as needed.  Acute kidney injury: Patient baseline creatinine noted to be 0.7.  He presents with creatinine elevated to 1.56 with BUN 17. Improved after IV fluids. Suspect secondary to above. V fluids as seen above. Monitor creatinine, electrolytes, and volume status.  Atrial fibrillation on anticoagulation: Chronic.  INR currently supertherapeutic at 3.1. CHA2DS2-VASc score = at least 3. Warfarin held pending possible necessary surgical intervention. Heparin drip per pharmacy once NGT is stable. Diltiazem drip has been started as heart rate has been very high today.  Dementia with behavioral disturbance: Patient on medications of Aricept at home.  Wife notes that he has been more confused.  I have seen and examined this patient myself. I have spent 45 minutes in his evaluation and care.  DVT prophylaxis: Heparin Code Status: Full Family Communication: Discussed plan of care with the patient's wife who is at the patient's bedside this morning. Disposition Plan: Possible discharge home in 2 to 3 days  Russell Black, DO [facility] Direct contact: see [email or web address]  7PM-7AM contact night coverage as above 05/07/2019, 2:39 PM  LOS: 1 day

## 2019-05-07 NOTE — Progress Notes (Signed)
Dr. Benny Lennert notified about heart rate and patient vomiting.

## 2019-05-07 NOTE — Progress Notes (Signed)
Spoke with general surgery about patient fighting [place] while trying to place NG tube. They will give [place] an order for Versed to try to attempt NG tube one more time, if unsuccessful they will place an order for radiology to place.

## 2019-05-07 NOTE — Care Management Important Message (Signed)
Important Message  Patient Details  Name: Russell Dillon MRN: [id number] Date of Birth: 1946/07/13   Medicare Important Message Given:  Yes     Orbie Pyo 05/07/2019, 2:34 PM

## 2019-05-07 NOTE — Progress Notes (Signed)
ANTICOAGULATION CONSULT NOTE - Follow Up Consult  Pharmacy Consult for Heparin Indication: atrial fibrillation  No Known Allergies  Patient Measurements: Height: 6' (182.9 cm) Weight: 181 lb (82.1 kg) IBW/kg (Calculated) : 77.6  Vital Signs: Temp: 98.3 F (36.8 C) (06/26 0743) Temp Source: Oral (06/26 0743) BP: 122/91 (06/26 0743) Pulse Rate: 100 (06/26 0743)  Labs: Recent Labs    05/05/19 1707 05/06/19 0200 05/07/19 0248  HGB 12.6* 11.3* 11.4*  HCT 40.9 36.2* 36.6*  PLT 370 371 343  LABPROT 24.4* 30.2* 29.1*  INR 2.2* 2.9* 2.8*  CREATININE 1.56* 1.19 0.97    Estimated Creatinine Clearance: 73.3 mL/min (by C-G formula based on SCr of 0.97 mg/dL).   Medications:  Coumadin PTA regimen: 5mg  MWF; 2.5mg  TTSaSu  Assessment: 73 year old male admitted 5/7 with pyelonephritis and E coli bacteremia. He is on chronic anticoagulation with Coumadin for atrial fibrillation. His INR was supratherapeutic on admission, likely due to acute illness and recent Levaquin therapy.  INR today is 2.8 -- continue to hold heparin  Goal of Therapy:  INR 2-3 Monitor platelets by anticoagulation protocol: Yes  Heparin level = 0.3 to 0.7   Plan:  Heparin when INR < 2 Daily PT/INR  Monitor for bleeding  Thank you Anette Guarneri, PharmD Please utilize Amion for appropriate phone number to reach the unit pharmacist ([place])    05/07/2019 8:34 AM

## 2019-05-07 NOTE — Progress Notes (Signed)
Initial Nutrition Assessment  DOCUMENTATION CODES:   Not applicable  INTERVENTION:  Monitor for diet advancement  Recommend Boost Breeze when advanced to CL, Boost Breeze po TID, each supplement provides 250 kcal and 9 grams of protein   NUTRITION DIAGNOSIS:   Inadequate oral intake related to altered GI function(SBO) as evidenced by NPO status.   GOAL:   Patient will meet greater than or equal to 90% of their needs   MONITOR:   Labs, Weight trends, Diet advancement  REASON FOR ASSESSMENT:   Low Braden    ASSESSMENT:  73 year old male with medical history significant of chronic a-fib, HTN, HLD, dementia admitted with SBO and cholelithiasis.  Patient sitting up in bed with wife at bedside at visit. Patient reports feeling very nauseas and holding bag. Patient poor historian d/t dementia. Wife reports patient had an episode of vomiting earlier this morning. Per chart review, MD recommends NG tube for decompression. MD will recheck film tomorrow to decide if surgical intervention is needed.   Wife reports that patient has had several UTI's over the past few months, one requiring recent hospitalization.  She recalls pt overall has a good appetite when feeling well and reports 3 small meals daily. She prepares meals breakfast and lunch (eggs, yogurt, half sandwich w/ fruit or chips) Dinner is brought in by church group or other family members. Wife reports that pt drinks 6-8 glasses of water and enjoys chocolate Ensure.  Wife recalls UBW of 175lb. In 2006 pt weighed approximately 325lb and after having a heart attack, he adopted a healthy lifestyle and gradually lost 150lbs over the next 3 years.   NUTRITION - FOCUSED PHYSICAL EXAM:  Limited exam d/t pt nausea    Most Recent Value  Orbital Region  Moderate depletion  Upper Arm Region  Mild depletion  Thoracic and Lumbar Region  Unable to assess  Temple Region  Mild depletion  Clavicle Bone Region  Mild depletion   Clavicle and Acromion Bone Region  Unable to assess  Scapular Bone Region  Unable to assess  Dorsal Hand  Mild depletion  Patellar Region  Unable to assess  Anterior Thigh Region  Unable to assess  Posterior Calf Region  Unable to assess  Edema (RD Assessment)  None  Hair  Reviewed  Eyes  Reviewed  Mouth  Unable to assess  Skin  Reviewed  Nails  Reviewed        Diet Order:   Diet Order            Diet NPO time specified Except for: Ice Chips  Diet effective now              EDUCATION NEEDS:   No education needs have been identified at this time  Skin:  Skin Assessment: Reviewed RN Assessment  Last BM:  unknown  Height:   Ht Readings from Last 1 Encounters:  05/05/19 6' (1.829 m)    Weight:   Wt Readings from Last 1 Encounters:  05/06/19 82.1 kg    Ideal Body Weight:  80.91 kg  BMI:  Body mass index is 24.55 kg/m.  Estimated Nutritional Needs:   Kcal:  2000-2250  Protein:  98-115g  Fluid:  >/=2.2L    Lajuan Lines, RD, LDN  After Hours/Weekend Pager: [phone]

## 2019-05-08 ENCOUNTER — Inpatient Hospital Stay (HOSPITAL_COMMUNITY): Payer: Medicare Other

## 2019-05-08 LAB — BPAM FFP
Blood Product Expiration Date: 202006302359
Blood Product Expiration Date: 202006302359
ISSUE DATE / TIME: 202006261130
ISSUE DATE / TIME: 202006261248
Unit Type and Rh: 6200
Unit Type and Rh: 6200

## 2019-05-08 LAB — PROTIME-INR
INR: 2.1 — ABNORMAL HIGH (ref 0.8–1.2)
INR: 2.1 — ABNORMAL HIGH (ref 0.8–1.2)
INR: 2 — ABNORMAL HIGH (ref 0.8–1.2)
Prothrombin Time: 23.4 seconds — ABNORMAL HIGH (ref 11.4–15.2)
Prothrombin Time: 23.4 seconds — ABNORMAL HIGH (ref 11.4–15.2)
Prothrombin Time: 22.1 seconds — ABNORMAL HIGH (ref 11.4–15.2)

## 2019-05-08 LAB — PREPARE FRESH FROZEN PLASMA
Unit division: 0
Unit division: 0

## 2019-05-08 MED ORDER — SODIUM CHLORIDE 0.9% IV SOLUTION
Freq: Once | INTRAVENOUS | Status: AC
  Administered 2019-05-08: 11:00:00 via INTRAVENOUS

## 2019-05-08 MED ORDER — PHYTONADIONE 5 MG PO TABS
2.5000 mg | ORAL_TABLET | Freq: Once | ORAL | Status: AC
  Administered 2019-05-08: 11:00:00 2.5 mg via ORAL
  Filled 2019-05-08: qty 1

## 2019-05-08 MED ORDER — HEPARIN (PORCINE) 25000 UT/250ML-% IV SOLN
1350.0000 [IU]/h | INTRAVENOUS | Status: DC
  Administered 2019-05-08: 1000 [IU]/h via INTRAVENOUS
  Administered 2019-05-10 – 2019-05-11 (×2): 1350 [IU]/h via INTRAVENOUS
  Filled 2019-05-08 (×4): qty 250

## 2019-05-08 NOTE — Progress Notes (Signed)
ANTICOAGULATION CONSULT NOTE - Follow Up Consult  Pharmacy Consult for Heparin Indication: atrial fibrillation  No Known Allergies  Patient Measurements: Height: 6' (182.9 cm) Weight: 181 lb (82.1 kg) IBW/kg (Calculated) : 77.6  Vital Signs: Temp: 98.3 F (36.8 C) (06/27 0839) Temp Source: Oral (06/27 0839) BP: 132/77 (06/27 0839)  Labs: Recent Labs    05/05/19 1707 05/06/19 0200 05/07/19 0248 05/07/19 1419 05/08/19 0817 05/08/19 0939  HGB 12.6* 11.3* 11.4*  --   --   --   HCT 40.9 36.2* 36.6*  --   --   --   PLT 370 371 343  --   --   --   LABPROT 24.4* 30.2* 29.1* 24.5* 23.4* 23.4*  INR 2.2* 2.9* 2.8* 2.3* 2.1* 2.1*  CREATININE 1.56* 1.19 0.97  --   --   --     Estimated Creatinine Clearance: 73.3 mL/min (by C-G formula based on SCr of 0.97 mg/dL).   Medications:  Coumadin PTA regimen: 5mg  MWF; 2.5mg  TTSaSu  Assessment: 73 year old male admitted 5/7 with pyelonephritis and E coli bacteremia. He is on chronic anticoagulation with Coumadin for atrial fibrillation. His INR was supratherapeutic on admission, likely due to acute illness and recent Levaquin therapy.  INR today is  2.1- received FFP and to receive vitamin K  Goal of Therapy:  INR 2-3 Monitor platelets by anticoagulation protocol: Yes  Heparin level = 0.3 to 0.7   Plan:  Suspect INR < 2 by 6 pm, will begin heparin at 1000 units / hr then Daily heparin level, CBC Daily PT/INR  Monitor for bleeding  Thank you Anette Guarneri, PharmD [phone] Please utilize Amion for appropriate phone number to reach the unit pharmacist ([place])    05/08/2019 10:33 AM

## 2019-05-08 NOTE — Progress Notes (Signed)
Patient wife at the bedside today. Updated her on plan of care and patient condition. Answered all questions to satisfaction.

## 2019-05-08 NOTE — Progress Notes (Signed)
Heart rate controlled, rate of < 105 . Pt on Cardizem gtt , currently infusing @ 61ml/hr

## 2019-05-08 NOTE — Progress Notes (Signed)
Subjective: NG tube placed yesterday.  750 cc out overnight. No stools X-ray this morning shows tube placement in fundus and upper body of stomach.  Still looks more like SBO than ileus but not sure. Poor historian due to dementia.  History atrial fibrillation on Coumadin.  Coumadin is being reversed.  Started second unit of FFP just now.  Labs to be drawn after completion.  Objective: Vital signs in last 24 hours: Temp:  [97.8 F (36.6 C)-99.4 F (37.4 C)] 98 F (36.7 C) (06/27 0545) Pulse Rate:  [87-121] 97 (06/26 1355) Resp:  [12-38] 16 (06/27 0545) BP: (100-128)/(65-91) 113/75 (06/27 0545) SpO2:  [85 %-97 %] 93 % (06/27 0545) Last BM Date: 05/06/19  Intake/Output from previous day: 06/26 0701 - 06/27 0700 In: 3998.6 [I.V.:1849; Blood:994.3; NG/GT:750; IV Piggyback:405.3] Out: 1651 [Urine:900; Emesis/NG output:751] Intake/Output this shift: Total I/O In: 1718 [I.V.:984.4; Blood:328.3; IV Piggyback:405.3] Out: 750 [Emesis/NG output:750]  General appearance: Alert.  Disoriented.  Does verbalize and answers questions.  A little agitated.  Skin warm and dry Resp: clear to auscultation bilaterally GI: Soft.  Borderline distended.  Hypoactive bowel sounds.  Nontender.  Midline laparotomy incision healed without hernia. Extremities: extremities normal, atraumatic, no cyanosis or edema  Lab Results:  Recent Labs    05/06/19 0200 05/07/19 0248  WBC 14.6* 13.9*  HGB 11.3* 11.4*  HCT 36.2* 36.6*  PLT 371 343   BMET Recent Labs    05/06/19 0200 05/07/19 0248  NA 139 141  K 4.8 3.8  CL 107 110  CO2 23 21*  GLUCOSE 139* 135*  BUN 18 17  CREATININE 1.19 0.97  CALCIUM 9.3 9.6   PT/INR Recent Labs    05/07/19 0248 05/07/19 1419  LABPROT 29.1* 24.5*  INR 2.8* 2.3*   ABG No results for input(s): PHART, HCO3 in the last 72 hours.  Invalid input(s): PCO2, PO2  Studies/Results: Dg Abd 1 View  Result Date: 05/07/2019 CLINICAL DATA:  Small-bowel obstruction.  EXAM: ABDOMEN - 1 VIEW COMPARISON:  05/06/2019.  CT 05/05/2019 FINDINGS: Persistent prominent distended loops of small bowel are noted. Similar findings noted on prior exam. Gastric distention noted. Diaphragms are incompletely imaged limiting evaluation for free air. Stable sclerotic density noted the right ilium. IMPRESSION: Persistent prominently distended loops of small bowel consistent small bowel obstruction again noted. Similar findings noted on prior exam. Gastric distention noted. Electronically Signed   By: Marcello Moores  Register   On: 05/07/2019 10:09   Abd 1 View (kub)  Result Date: 05/06/2019 CLINICAL DATA:  Small bowel obstruction. EXAM: ABDOMEN - 1 VIEW COMPARISON:  CT abdomen and pelvis 05/05/2019 FINDINGS: Again noted is severe gaseous distension of small bowel and compatible with a high-grade obstruction. The degree of small bowel distension is similar to slightly increased. Limited evaluation for free air on this supine image. Focal sclerosis in the right ilium is compatible with a bone island. There is contrast within the urinary bladder. IMPRESSION: Persistent dilated loops of small bowel compatible with a high-grade small bowel obstruction. Degree of small bowel distension is stable to slightly increased from the recent CT. Electronically Signed   By: Markus Daft M.D.   On: 05/06/2019 09:19   Dg Abd Portable 1v-small Bowel Obstruction Protocol-initial, 8 Hr Delay  Result Date: 05/08/2019 CLINICAL DATA:  Small-bowel obstruction EXAM: PORTABLE ABDOMEN - 1 VIEW COMPARISON:  None. FINDINGS: The enteric tube terminates over the gastric fundus. The stomach appears to be partially filled with oral contrast. There is a small amount of  oral contrast within the small bowel. There are persistent dilated loops of small bowel scattered throughout the abdomen. A bone island projects over the patient's right iliac bone. There is a 2 graphic lucency projecting over the right iliac bone that is favored to  represent artifact from overlapping gas filled bowel. IMPRESSION: Persistent small bowel obstruction. A large portion of the oral contrast remains within the stomach. No definite oral contrast visualized within the colon. Electronically Signed   By: Constance Holster M.D.   On: 05/08/2019 03:35   Dg Abd Portable 1v-small Bowel Protocol-position Verification  Result Date: 05/07/2019 CLINICAL DATA:  Encounter for NG tube placement. EXAM: PORTABLE ABDOMEN - 1 VIEW 5:20 p.m. COMPARISON:  05/07/2019 at 8:16 a.m. and CT scan dated 02/08/2019 FINDINGS: NG tube has been inserted. The NG tube is looped in the fundus of the distended stomach. There multiple dilated loops of bowel as previously noted. Sclerotic lesion in the right iliac bone unchanged since CT scan of 02/08/2019 IMPRESSION: NG tube tip is in the fundus of the distended stomach. No change in the distended bowel loops. Electronically Signed   By: Lorriane Shire M.D.   On: 05/07/2019 18:41   Dg Abd Portable 1v-small Bowel Obstruction Protocol-initial, 8 Hr Delay  Result Date: 05/06/2019 CLINICAL DATA:  8 hour delay small-bowel obstruction EXAM: PORTABLE ABDOMEN - 1 VIEW COMPARISON:  05/06/2019, 05/05/2019 FINDINGS: Persistent dilatation of small bowel loops consistent with bowel obstruction. Small bowel distended up to 5.7 cm. Possible dilute contrast within dilated small bowel in the right abdomen. No convincing colon contrast. Residual contrast within the urinary bladder. IMPRESSION: 1. Persistent dilatation of small bowel, consistent with high-grade bowel obstruction. No convincing contrast within the colon. There may be slight dilute contrast within dilated small bowel in the right hemiabdomen. 2. Residual contrast within the urinary bladder Electronically Signed   By: Donavan Foil M.D.   On: 05/06/2019 22:48    Anti-infectives: Anti-infectives (From admission, onward)   Start     Dose/Rate Route Frequency Ordered Stop   05/06/19 2100   vancomycin (VANCOCIN) 1,250 mg in sodium chloride 0.9 % 250 mL IVPB  Status:  Discontinued     1,250 mg 166.7 mL/hr over 90 Minutes Intravenous Every 24 hours 05/05/19 1849 05/05/19 2057   05/06/19 0800  ceFEPIme (MAXIPIME) 2 g in sodium chloride 0.9 % 100 mL IVPB  Status:  Discontinued     2 g 200 mL/hr over 30 Minutes Intravenous Every 12 hours 05/05/19 1849 05/05/19 2057   05/06/19 0800  ceFEPIme (MAXIPIME) 2 g in sodium chloride 0.9 % 100 mL IVPB     2 g 200 mL/hr over 30 Minutes Intravenous Every 12 hours 05/05/19 2114     05/05/19 1830  ceFEPIme (MAXIPIME) 2 g in sodium chloride 0.9 % 100 mL IVPB     2 g 200 mL/hr over 30 Minutes Intravenous  Once 05/05/19 1817 05/05/19 2019   05/05/19 1830  metroNIDAZOLE (FLAGYL) IVPB 500 mg     500 mg 100 mL/hr over 60 Minutes Intravenous  Once 05/05/19 1817 05/05/19 2011   05/05/19 1830  vancomycin (VANCOCIN) IVPB 1000 mg/200 mL premix  Status:  Discontinued     1,000 mg 200 mL/hr over 60 Minutes Intravenous  Once 05/05/19 1817 05/05/19 1825   05/05/19 1830  vancomycin (VANCOCIN) 1,500 mg in sodium chloride 0.9 % 500 mL IVPB     1,500 mg 250 mL/hr over 120 Minutes Intravenous  Once 05/05/19 1825 05/05/19 2238  Assessment/Plan:   UTI - 80,000 COLONIES/mL DIPHTHEROIDS(CORYNEBACTERIUM SPECIES.   Thought to be septic secondary to this A fib on coumadin, INR 3--receiving FFP this morning Dementia HTN AV regurg COVID negative - 05/05/2019  SBO -Hx small bowel obstruction 03/01/19/small bowel resection 2004, Hx appendectomy 2004   -Small bowel protocol.  Contrast in the stomach.  NG tube now properly positioned and functioning.     -Continue nonoperative management.  No evidence of compromised bowel.  Hopefully this will be self-limited process but it is possible he may require laparotomy this admission.  FEN -NPO VTE -hold coumadin ID -maxipime for uti. DVT:  INR 2.3 (1400-6/26) POC: Garlitz,[name] Spouse [phone]   [phone]  MOORFIELD-LANG,[name] Daughter   [phone]    Plan N.p.o., NG drainage.  Reversing Coumadin.  No acute surgical indication.  Repeat labs and x-rays tomorrow morning.  Hopefully this will be a self-limited process, but if SBO becomes more apparent and persists may need laparotomy this admission.    LOS: 3 days    Russell Dillon 05/08/2019

## 2019-05-08 NOTE — Progress Notes (Signed)
PROGRESS NOTE  Russell Dillon ZHY:[id number] DOB: [birth year] DOA: 05/05/2019 PCP: Marton Redwood, MD  Brief History   The patient is lying in bed. He is very confused. NGT is in place.  Consultants  . General Surgery  Procedures  . None  Antibiotics  . Cefepime  Subjective  The patient is sleeping upon my visit. He has had emesis x 1 and has been tachycardic with HR in the 150's. Diltiazem drip started.  Objective   Vitals:  Vitals:   05/08/19 1500 05/08/19 1517  BP: (!) 110/56 111/74  Pulse:    Resp: 12 14  Temp: 98.3 F (36.8 C) 98.5 F (36.9 C)  SpO2: 94% 94%    Exam:  Constitutional:  . The patient is calm, awake, and alert. No acute distress. Respiratory:  . No increased work of breathing  . No wheezes, rales, or rhonchi.  . No tactile fremitus Cardiovascular:  . Regular rate and rhythm. . No murmurs ectopy of gallups. . No lateral PMI. No thrills.   Abdomen:  . Abdomen is quite distended, taut. Tenderness not elicited with palpation, but patient is sleeping. . No hernias, masses, or organomegaly are appreciated. Marland Kitchen Hypoactive bowel sounds. Musculoskeletal:  . No cyanosis, clubbing, or edema. Skin:  . No rashes, lesions, ulcers . palpation of skin: no induration or nodules Neurologic: Unable to evaluate due to the patient's inability to participate with exam.  Psychiatric:  Unable to evaluate due to the patient's inability to participate with exam.  I have personally reviewed the following:   Today's Data  . BMP, CBC, Vitals, PT/INR  Imaging  . 1 view abdomen: Dilated loops of bowel, stomach,and transition zone compatible with a high grade obstruction. .   Scheduled Meds: . sodium chloride   Intravenous Once  . sodium chloride flush  3 mL Intravenous Q12H   Continuous Infusions: . sodium chloride 100 mL/hr at 05/08/19 0652  . ceFEPime (MAXIPIME) IV 2 g (05/08/19 0946)  . diltiazem (CARDIZEM) infusion 5 mg/hr (05/08/19 0800)  . heparin       Principal Problem:   Sepsis secondary to UTI [facility]) Active Problems:   Atrial fibrillation, chronic   Long term (current) use of anticoagulants   SBO (small bowel obstruction) (HCC)   Dementia without behavioral disturbance (HCC)   AKI (acute kidney injury) ([place])   Transient hypotension   LOS: 3 days    A & P   Sepsis 2/2 UTI : Patient presents tachycardic, WBC 17, and lactic acid elevated at 3.6.  Urinalysis concerning for possible source of infection.  Although symptoms could be stemming from SBO.  Patient had been empirically given antibiotics of vancomycin, cefepime, and metronidazole.  During last hospitalization in May patient was noted to be positive for E. coli that was resistant to ciprofloxacin and ampicillin/sulbactam, but sensitive to Rocephin.  the patient was admitted to a medical telemetry bed. Blood and urine cultures have been obtained. He will be placed on empiric cefepime pending culture results. Lactic acid was trended.  Small bowel obstruction: Acute.  Patient complained of abdominal pain prior to arrival and had one episode of vomiting.  CT scan shows small bowel obstruction with transition trauma in the mid left hemiabdomen.  Risk factors include previous history of small bowel obstruction related with adhesions. The patient remains NPO. General surgery has seen the patient. As the patient's INR remains 3, they are holding off on placing NGT. Coumadin has now been held and the patient is on a heparin  drip pending possible surgical intervention. Continue IV fluids and NPO status. I have discussed the patient with surgery. NGT needs to be placed to decompress the bowel, but patient's INR is 2.8. Will give 2 units of FFP, and repeat INR. Heparin drip can be started once NGT in place and stable. I have discussed this plan with the patient's wife. She is in agreement.  Transient hypotension: Resolving.  Blood pressures currently appears stable.  Patient noted to be  hypotensive prior to arrival. IV fluids as seen above bolus IV fluids as needed.  Acute kidney injury: Patient baseline creatinine noted to be 0.7.  He presents with creatinine elevated to 1.56 with BUN 17. Improved after IV fluids. Suspect secondary to above. V fluids as seen above. Monitor creatinine, electrolytes, and volume status.  Atrial fibrillation on anticoagulation: Chronic.  INR currently supertherapeutic at 3.1. CHA2DS2-VASc score = at least 3. Warfarin held pending possible necessary surgical intervention. Heparin drip per pharmacy once NGT is stable. Diltiazem drip has been started as heart rate has been very high today.  Dementia with behavioral disturbance: Patient on medications of Aricept at home.  Wife notes that he has been more confused.  I have seen and examined this patient myself. I have spent 35 minutes in his evaluation and care.  DVT prophylaxis: Heparin Code Status: Full Family Communication: Discussed plan of care with the patient's wife who is at the patient's bedside this morning. Disposition Plan: Possible discharge home in 2 to 3 days  Lorain Fettes, DO [facility] Direct contact: see [email or web address]  7PM-7AM contact night coverage as above 05/08/2019, 4:06 PM  LOS: 1 day

## 2019-05-09 ENCOUNTER — Inpatient Hospital Stay (HOSPITAL_COMMUNITY): Payer: Medicare Other

## 2019-05-09 LAB — PREPARE FRESH FROZEN PLASMA
Unit division: 0
Unit division: 0
Unit division: 0
Unit division: 0

## 2019-05-09 LAB — PROTIME-INR
INR: 1.9 — ABNORMAL HIGH (ref 0.8–1.2)
INR: 1.3 — ABNORMAL HIGH (ref 0.8–1.2)
Prothrombin Time: 21.5 seconds — ABNORMAL HIGH (ref 11.4–15.2)
Prothrombin Time: 16.2 seconds — ABNORMAL HIGH (ref 11.4–15.2)

## 2019-05-09 LAB — BASIC METABOLIC PANEL
Anion gap: 16 — ABNORMAL HIGH (ref 5–15)
BUN: 15 mg/dL (ref 8–23)
CO2: 26 mmol/L (ref 22–32)
Calcium: 9.8 mg/dL (ref 8.9–10.3)
Chloride: 106 mmol/L (ref 98–111)
Creatinine, Ser: 0.92 mg/dL (ref 0.61–1.24)
GFR calc Af Amer: >60 mL/min (ref >60)
GFR calc non Af Amer: >60 mL/min (ref >60)
Glucose, Bld: 103 mg/dL — ABNORMAL HIGH (ref 70–99)
Potassium: 2.6 mmol/L — CL (ref 3.5–5.1)
Sodium: 148 mmol/L — ABNORMAL HIGH (ref 135–145)

## 2019-05-09 LAB — HEPARIN LEVEL (UNFRACTIONATED)
Heparin Unfractionated: 0.16 IU/mL — ABNORMAL LOW (ref 0.30–0.70)
Heparin Unfractionated: 0.21 IU/mL — ABNORMAL LOW (ref 0.30–0.70)
Heparin Unfractionated: 0.36 IU/mL (ref 0.30–0.70)

## 2019-05-09 LAB — BPAM FFP
Blood Product Expiration Date: 202007012359
Blood Product Expiration Date: 202007012359
Blood Product Expiration Date: 202007022359
Blood Product Expiration Date: 202007022359
ISSUE DATE / TIME: 202006270345
ISSUE DATE / TIME: 202006270523
ISSUE DATE / TIME: 202006271039
ISSUE DATE / TIME: 202006271441
Unit Type and Rh: 6200
Unit Type and Rh: 6200
Unit Type and Rh: 600
Unit Type and Rh: 6200

## 2019-05-09 LAB — CBC
HCT: 34.8 % — ABNORMAL LOW (ref 39.0–52.0)
Hemoglobin: 10.5 g/dL — ABNORMAL LOW (ref 13.0–17.0)
MCH: 25 pg — ABNORMAL LOW (ref 26.0–34.0)
MCHC: 30.2 g/dL (ref 30.0–36.0)
MCV: 82.9 fL (ref 80.0–100.0)
Platelets: 292 10*3/uL (ref 150–400)
RBC: 4.2 MIL/uL — ABNORMAL LOW (ref 4.22–5.81)
RDW: 19.4 % — ABNORMAL HIGH (ref 11.5–15.5)
WBC: 8.5 10*3/uL (ref 4.0–10.5)
nRBC: 0 % (ref 0.0–0.2)

## 2019-05-09 MED ORDER — POTASSIUM CHLORIDE 10 MEQ/100ML IV SOLN: 10.0000 meq | INTRAVENOUS | Status: DC

## 2019-05-09 MED ORDER — POTASSIUM CHLORIDE 10 MEQ/100ML IV SOLN
10.0000 meq | INTRAVENOUS | Status: AC
  Administered 2019-05-09 (×6): 10 meq via INTRAVENOUS
  Filled 2019-05-09 (×6): qty 100

## 2019-05-09 MED ORDER — VITAMIN K1 10 MG/ML IJ SOLN
2.5000 mg | Freq: Once | INTRAVENOUS | Status: AC
  Administered 2019-05-09: 2.5 mg via INTRAVENOUS
  Filled 2019-05-09: qty 0.25

## 2019-05-09 MED ORDER — POTASSIUM CHLORIDE IN NACL 20-0.9 MEQ/L-% IV SOLN
INTRAVENOUS | Status: DC
  Administered 2019-05-09 – 2019-05-11 (×3): via INTRAVENOUS
  Filled 2019-05-09 (×5): qty 1000

## 2019-05-09 NOTE — Progress Notes (Signed)
PROGRESS NOTE  Russell Dillon OBS:[id number] DOB: March 07, 1946 DOA: 05/05/2019 PCP: Marton Redwood, MD  Brief History   The patient is a 73 yr old man who was admitted through the ED with a UTI and a bowel obstruction with an apparent transition point. General surgery has been consulted and was following. An attempt was made to relieve the obstruction conservatively with keeping the patient NPO, but was unsuccessful. Placement of NGT was delayed to some degree due to the patient's elevated INR despite warfarin being held. The patient has received 6 units of FFP and IV vitamin K. NGT was placed yesterday and is yielding copious GI aspirate. He is receiving IV cefepime. His heart rate has been high due his atrial fibrillation. He has been placed on a diltiazem drip. Daily x-rays of the abdomen has not revealed any progress in resolving the obstruction.  Consultants   General Surgery  Procedures   NGT place  Antibiotics   Cefepime  Subjective  The patient is awake and alert. No acute distress.  Objective   Vitals:  Vitals:   05/09/19 0447 05/09/19 0801  BP:  125/82  Pulse:    Resp:  (!) 21  Temp: (!) 97.4 F (36.3 C) 98 F (36.7 C)  SpO2:  98%    Exam:  Constitutional:   The patient is calm, awake, and alert. No acute distress. Respiratory:   No increased work of breathing   No wheezes, rales, or rhonchi.   No tactile fremitus Cardiovascular:   Regular rate and rhythm.  No murmurs ectopy of gallups.  No lateral PMI. No thrills.   Abdomen:   Abdomen is less distended this morning. Mild tenderness elicited with palpation.  No hernias, masses, or organomegaly are appreciated.  Hypoactive bowel sounds. Musculoskeletal:   No cyanosis, clubbing, or edema. Skin:   No rashes, lesions, ulcers  palpation of skin: no induration or nodules   Neurologic: Unable to evaluate due to the patient's inability to participate with exam.  Psychiatric:  Unable to  evaluate due to the patient's inability to participate with exam.  I have personally reviewed the following:   Today's Data   BMP, CBC, Vitals, PT/INR  Imaging   1 view abdomen: Dilated loops of bowel, stomach,and transition zone compatible with a high grade obstruction.    Scheduled Meds:  sodium chloride   Intravenous Once   sodium chloride flush  3 mL Intravenous Q12H   Continuous Infusions:  0.9 % NaCl with KCl 20 mEq / L     ceFEPime (MAXIPIME) IV 2 g (05/09/19 0818)   diltiazem (CARDIZEM) infusion 5 mg/hr (05/08/19 2250)   heparin 1,350 Units/hr (05/09/19 1418)    Principal Problem:   Sepsis secondary to UTI [facility]) Active Problems:   Atrial fibrillation, chronic   Long term (current) use of anticoagulants   SBO (small bowel obstruction) (HCC)   Dementia without behavioral disturbance (HCC)   AKI (acute kidney injury) ([place])   Transient hypotension   LOS: 4 days    A & P   Sepsis 2/2 UTI : Patient presents tachycardic, WBC 17, and lactic acid elevated at 3.6.  Urinalysis concerning for possible source of infection.  Although symptoms could be stemming from SBO.  Patient had been empirically given antibiotics of vancomycin, cefepime, and metronidazole.  During last hospitalization in May patient was noted to be positive for E. coli that was resistant to ciprofloxacin and ampicillin/sulbactam, but sensitive to Rocephin.  the patient was admitted to a medical telemetry  bed. Blood and urine cultures have been obtained. He will be placed on empiric cefepime pending culture results. Lactic acid was trended.  Small bowel obstruction: Acute.  Patient complained of abdominal pain prior to arrival and had one episode of vomiting.  CT scan shows small bowel obstruction with transition trauma in the mid left hemiabdomen.  Risk factors include previous history of small bowel obstruction related with adhesions. The patient remains NPO. General surgery has seen the patient. As  the patient's INR remains 3, they are holding off on placing NGT. Coumadin has now been held and the patient is on a heparin drip pending possible surgical intervention. Continue IV fluids and NPO status. I have discussed the patient with surgery. NGT needs to be placed to decompress the bowel, but patient's INR is 2.8. The patient has received 6 units of FFP as well as IV vitamin K. Repeat INR is 1.9 this morning.I appreciate general surgery's assistance.  Transient hypotension: Resolved.  Blood pressures currently appears stable. IV fluids as seen above bolus IV fluids as needed.  Acute kidney injury: Patient baseline creatinine noted to be 0.7.  He presents with creatinine elevated to 1.56 with BUN 17. Creatinine was 0.92 this morning.   Hypokalemia: Potassium 2.6 this morning. Supplement and Monitor.  Atrial fibrillation on anticoagulation: Chronic.  INR currently supertherapeutic at 3.1. CHA2DS2-VASc score = at least 3. Warfarin held pending possible necessary surgical intervention. Heparin drip per pharmacy once NGT is stable. Diltiazem drip has been started and continued for ongoing RVR.  Dementia with behavioral disturbance: Patient on medications of Aricept at home.   I have seen and examined this patient myself. I have spent 32 minutes in his evaluation and care.  DVT prophylaxis: Heparin Code Status: Full Family Communication: Discussed plan of care with the patient's wife who is at the patient's bedside this morning. Disposition Plan: Possible discharge home in 2 to 3 days  Ramanda Paules, DO [facility] Direct contact: see [email or web address]  7PM-7AM contact night coverage as above 05/09/2019, 3:27 PM  LOS: 1 day

## 2019-05-09 NOTE — Progress Notes (Signed)
[place] for Heparin Indication: atrial fibrillation  No Known Allergies  Patient Measurements: Height: 6' (182.9 cm) Weight: 181 lb (82.1 kg) IBW/kg (Calculated) : 77.6  Vital Signs: Temp: 97.4 F (36.3 C) (06/28 0447) Temp Source: Oral (06/28 0447) BP: 125/87 (06/27 2258)  Labs: Recent Labs    05/07/19 0248  05/08/19 0939 05/08/19 1624 05/09/19 0609  HGB 11.4*  --   --   --  10.5*  HCT 36.6*  --   --   --  34.8*  PLT 343  --   --   --  292  LABPROT 29.1*   < > 23.4* 22.1* 21.5*  INR 2.8*   < > 2.1* 2.0* 1.9*  HEPARINUNFRC  --   --   --   --  0.16*  CREATININE 0.97  --   --   --   --    < > = values in this interval not displayed.    Estimated Creatinine Clearance: 73.3 mL/min (by C-G formula based on SCr of 0.97 mg/dL).   Medications:  Coumadin PTA regimen: 5mg  MWF; 2.5mg  TTSaSu  Assessment: 73 year old male admitted 5/7 with pyelonephritis and E coli bacteremia. He is on chronic anticoagulation with Coumadin for atrial fibrillation. His INR was supratherapeutic on admission, likely due to acute illness and recent Levaquin therapy. Heparin drip started as bridge.  Heparin level this morning 0.16 units/ml  Goal of Therapy:  INR 2-3 Monitor platelets by anticoagulation protocol: Yes  Heparin level = 0.3 to 0.7   Plan:  Increase heparin to 1200 units/hr Check heparin level in 6 hours Daily heparin level, CBC Daily PT/INR  Monitor for bleeding  Thanks for allowing pharmacy to be a part of this patient's care.  Excell Seltzer, PharmD Clinical Pharmacist  05/09/2019 6:49 AM

## 2019-05-09 NOTE — Progress Notes (Signed)
ANTICOAGULATION CONSULT NOTE  Pharmacy Consult for Heparin Indication: atrial fibrillation  No Known Allergies  Patient Measurements: Height: 6' (182.9 cm) Weight: 180 lb 8.9 oz (81.9 kg) IBW/kg (Calculated) : 77.6  Vital Signs: Temp: 98.2 F (36.8 C) (06/28 2049) Temp Source: Oral (06/28 2049)  Labs: Recent Labs    05/07/19 0248  05/08/19 1624 05/09/19 0609 05/09/19 1322 05/09/19 1444 05/09/19 2046  HGB 11.4*  --   --  10.5*  --   --   --   HCT 36.6*  --   --  34.8*  --   --   --   PLT 343  --   --  292  --   --   --   LABPROT 29.1*   < > 22.1* 21.5*  --  16.2*  --   INR 2.8*   < > 2.0* 1.9*  --  1.3*  --   HEPARINUNFRC  --   --   --  0.16* 0.21*  --  0.36  CREATININE 0.97  --   --  0.92  --   --   --    < > = values in this interval not displayed.    Estimated Creatinine Clearance: 77.3 mL/min (by C-G formula based on SCr of 0.92 mg/dL).   Medications:  Coumadin PTA regimen: 5mg  MWF; 2.5mg  TTSaSu  Assessment: 73 year old male admitted 5/7 with pyelonephritis and E coli bacteremia. He is on chronic anticoagulation with Coumadin for atrial fibrillation. His INR was supratherapeutic on admission, likely due to acute illness and recent Levaquin therapy. Heparin drip started as bridge.  Heparin level this PM = 0.36 units/ml  Goal of Therapy:  INR 2-3 Monitor platelets by anticoagulation protocol: Yes  Heparin level = 0.3 to 0.7   Plan:  Continue heparin at 1350 units/hr Daily heparin level, CBC Daily PT/INR  Monitor for bleeding  Thank you Anette Guarneri, PharmD  05/09/2019 10:14 PM

## 2019-05-09 NOTE — Progress Notes (Signed)
ANTICOAGULATION CONSULT NOTE  Pharmacy Consult for Heparin Indication: atrial fibrillation  No Known Allergies  Patient Measurements: Height: 6' (182.9 cm) Weight: 180 lb 8.9 oz (81.9 kg) IBW/kg (Calculated) : 77.6  Vital Signs: Temp: 98 F (36.7 C) (06/28 0801) Temp Source: Oral (06/28 0801) BP: 125/82 (06/28 0801)  Labs: Recent Labs    05/07/19 0248  05/08/19 0939 05/08/19 1624 05/09/19 0609 05/09/19 1322  HGB 11.4*  --   --   --  10.5*  --   HCT 36.6*  --   --   --  34.8*  --   PLT 343  --   --   --  292  --   LABPROT 29.1*   < > 23.4* 22.1* 21.5*  --   INR 2.8*   < > 2.1* 2.0* 1.9*  --   HEPARINUNFRC  --   --   --   --  0.16* 0.21*  CREATININE 0.97  --   --   --  0.92  --    < > = values in this interval not displayed.    Estimated Creatinine Clearance: 77.3 mL/min (by C-G formula based on SCr of 0.92 mg/dL).   Medications:  Coumadin PTA regimen: 5mg  MWF; 2.5mg  TTSaSu  Assessment: 73 year old male admitted 5/7 with pyelonephritis and E coli bacteremia. He is on chronic anticoagulation with Coumadin for atrial fibrillation. His INR was supratherapeutic on admission, likely due to acute illness and recent Levaquin therapy. Heparin drip started as bridge.  Heparin level this PM = 0.21  Goal of Therapy:  INR 2-3 Monitor platelets by anticoagulation protocol: Yes  Heparin level = 0.3 to 0.7   Plan:  Increase heparin to 1350 units/hr Heparin level at 2100 pm Daily heparin level, CBC Daily PT/INR  Monitor for bleeding  Thank you Anette Guarneri, PharmD  05/09/2019 2:07 PM

## 2019-05-09 NOTE — Progress Notes (Signed)
Patient's wife at bedside today. Updated her on plan of care and patient condition. Questions answered to satisfaction.

## 2019-05-09 NOTE — Progress Notes (Signed)
Subjective: Confused and disoriented, but much less agitated.  Cooperative.  Answers questions appropriately Denies pain or nausea No stools overnight NG output significant, 1750 cc overnight.  Urine output adequate.  Received 2 units FFP yesterday.  INR still 2.0. I do not see any lab results this morning yet. Abdominal x-ray this morning pending  Looks like he is still on heparin drip.  Objective: Vital signs in last 24 hours: Temp:  [97.4 F (36.3 C)-99 F (37.2 C)] 97.4 F (36.3 C) (06/28 0447) Pulse Rate:  [101] 101 (06/27 1206) Resp:  [11-26] 12 (06/27 2258) BP: (103-132)/(56-87) 125/87 (06/27 2258) SpO2:  [85 %-95 %] 93 % (06/27 2258) Last BM Date: 05/06/19  Intake/Output from previous day: 06/27 0701 - 06/28 0700 In: 1550.9 [I.V.:809.9; Blood:641; IV Piggyback:100] Out: 2400 [Urine:650; Emesis/NG output:1750] Intake/Output this shift: No intake/output data recorded.  PE: General appearance: Alert.  Disoriented.  Does verbalize and answers questions. Not aggitated  Skin warm and dry Resp: clear to auscultation bilaterally GI:  Abdomen less distended and softer this morning.  Nontender.  Hypoactive bowel sounds. Hypoactive bowel sounds.  Nontender.  Midline laparotomy incision healed without hernia.  NG in place with bilious drainage. Extremities: extremities normal, atraumatic, no cyanosis or edema   Lab Results:  Recent Labs    05/07/19 0248  WBC 13.9*  HGB 11.4*  HCT 36.6*  PLT 343   BMET Recent Labs    05/07/19 0248  NA 141  K 3.8  CL 110  CO2 21*  GLUCOSE 135*  BUN 17  CREATININE 0.97  CALCIUM 9.6   PT/INR Recent Labs    05/08/19 0939 05/08/19 1624  LABPROT 23.4* 22.1*  INR 2.1* 2.0*   ABG No results for input(s): PHART, HCO3 in the last 72 hours.  Invalid input(s): PCO2, PO2  Studies/Results: Dg Abd 1 View  Result Date: 05/07/2019 CLINICAL DATA:  Small-bowel obstruction. EXAM: ABDOMEN - 1 VIEW COMPARISON:  05/06/2019.  CT  05/05/2019 FINDINGS: Persistent prominent distended loops of small bowel are noted. Similar findings noted on prior exam. Gastric distention noted. Diaphragms are incompletely imaged limiting evaluation for free air. Stable sclerotic density noted the right ilium. IMPRESSION: Persistent prominently distended loops of small bowel consistent small bowel obstruction again noted. Similar findings noted on prior exam. Gastric distention noted. Electronically Signed   By: Marcello Moores  Register   On: 05/07/2019 10:09   Dg Abd Portable 1v-small Bowel Obstruction Protocol-initial, 8 Hr Delay  Result Date: 05/08/2019 CLINICAL DATA:  Small-bowel obstruction EXAM: PORTABLE ABDOMEN - 1 VIEW COMPARISON:  None. FINDINGS: The enteric tube terminates over the gastric fundus. The stomach appears to be partially filled with oral contrast. There is a small amount of oral contrast within the small bowel. There are persistent dilated loops of small bowel scattered throughout the abdomen. A bone island projects over the patient's right iliac bone. There is a 2 graphic lucency projecting over the right iliac bone that is favored to represent artifact from overlapping gas filled bowel. IMPRESSION: Persistent small bowel obstruction. A large portion of the oral contrast remains within the stomach. No definite oral contrast visualized within the colon. Electronically Signed   By: Constance Holster M.D.   On: 05/08/2019 03:35   Dg Abd Portable 1v-small Bowel Protocol-position Verification  Result Date: 05/07/2019 CLINICAL DATA:  Encounter for NG tube placement. EXAM: PORTABLE ABDOMEN - 1 VIEW 5:20 p.m. COMPARISON:  05/07/2019 at 8:16 a.m. and CT scan dated 02/08/2019 FINDINGS: NG tube has been inserted. The  NG tube is looped in the fundus of the distended stomach. There multiple dilated loops of bowel as previously noted. Sclerotic lesion in the right iliac bone unchanged since CT scan of 02/08/2019 IMPRESSION: NG tube tip is in the fundus  of the distended stomach. No change in the distended bowel loops. Electronically Signed   By: Lorriane Shire M.D.   On: 05/07/2019 18:41    Anti-infectives: Anti-infectives (From admission, onward)   Start     Dose/Rate Route Frequency Ordered Stop   05/06/19 2100  vancomycin (VANCOCIN) 1,250 mg in sodium chloride 0.9 % 250 mL IVPB  Status:  Discontinued     1,250 mg 166.7 mL/hr over 90 Minutes Intravenous Every 24 hours 05/05/19 1849 05/05/19 2057   05/06/19 0800  ceFEPIme (MAXIPIME) 2 g in sodium chloride 0.9 % 100 mL IVPB  Status:  Discontinued     2 g 200 mL/hr over 30 Minutes Intravenous Every 12 hours 05/05/19 1849 05/05/19 2057   05/06/19 0800  ceFEPIme (MAXIPIME) 2 g in sodium chloride 0.9 % 100 mL IVPB     2 g 200 mL/hr over 30 Minutes Intravenous Every 12 hours 05/05/19 2114     05/05/19 1830  ceFEPIme (MAXIPIME) 2 g in sodium chloride 0.9 % 100 mL IVPB     2 g 200 mL/hr over 30 Minutes Intravenous  Once 05/05/19 1817 05/05/19 2019   05/05/19 1830  metroNIDAZOLE (FLAGYL) IVPB 500 mg     500 mg 100 mL/hr over 60 Minutes Intravenous  Once 05/05/19 1817 05/05/19 2011   05/05/19 1830  vancomycin (VANCOCIN) IVPB 1000 mg/200 mL premix  Status:  Discontinued     1,000 mg 200 mL/hr over 60 Minutes Intravenous  Once 05/05/19 1817 05/05/19 1825   05/05/19 1830  vancomycin (VANCOCIN) 1,500 mg in sodium chloride 0.9 % 500 mL IVPB     1,500 mg 250 mL/hr over 120 Minutes Intravenous  Once 05/05/19 1825 05/05/19 2238      Assessment/Plan:  UTI-80,000 COLONIES/mL DIPHTHEROIDS(CORYNEBACTERIUM SPECIES.   Thought to be septic secondary to this A fib on coumadin, INR 3--receiving FFP 6/27-INR still elevated last night--vitamin K 2.5 mg IV ordered this morning Dementia HTN AV regurg COVIDnegative -05/05/2019  SBO-Hx small bowel obstruction 03/01/19/small bowel resection 2004,Hx appendectomy 2004 -Small bowel protocol.  Contrast in the stomach.  NG tube now properly positioned and  functioning.    Continue NG and bowel rest. -To repeat labs and x-ray now. -Clinically he has no evidence of bowel compromise and abdominal exam is improved.  However bowel function has not returned and if he continues to show pattern of SBO may require laparotomy this admission   FEN -NPO VTE -hold coumadin ID -maxipime for uti. DVT: INR 2.3 (1400-6/26); 2.1 last noght POC: Tapia,[name] Spouse [phone]  [phone]  MOORFIELD-LANG,[name] Daughter   [phone]      LOS: 4 days    Russell Dillon 05/09/2019

## 2019-05-10 ENCOUNTER — Inpatient Hospital Stay (HOSPITAL_COMMUNITY): Payer: Medicare Other

## 2019-05-10 LAB — CBC
HCT: 35.2 % — ABNORMAL LOW (ref 39.0–52.0)
Hemoglobin: 10.8 g/dL — ABNORMAL LOW (ref 13.0–17.0)
MCH: 25.5 pg — ABNORMAL LOW (ref 26.0–34.0)
MCHC: 30.7 g/dL (ref 30.0–36.0)
MCV: 83 fL (ref 80.0–100.0)
Platelets: 303 10*3/uL (ref 150–400)
RBC: 4.24 MIL/uL (ref 4.22–5.81)
RDW: 19.5 % — ABNORMAL HIGH (ref 11.5–15.5)
WBC: 8.9 10*3/uL (ref 4.0–10.5)
nRBC: 0 % (ref 0.0–0.2)

## 2019-05-10 LAB — CULTURE, BLOOD (ROUTINE X 2)
Culture: NO GROWTH
Culture: NO GROWTH
Special Requests: ADEQUATE

## 2019-05-10 LAB — BASIC METABOLIC PANEL
Anion gap: 14 (ref 5–15)
BUN: 18 mg/dL (ref 8–23)
CO2: 26 mmol/L (ref 22–32)
Calcium: 9.6 mg/dL (ref 8.9–10.3)
Chloride: 107 mmol/L (ref 98–111)
Creatinine, Ser: 0.86 mg/dL (ref 0.61–1.24)
GFR calc Af Amer: >60 mL/min (ref >60)
GFR calc non Af Amer: >60 mL/min (ref >60)
Glucose, Bld: 92 mg/dL (ref 70–99)
Potassium: 3 mmol/L — ABNORMAL LOW (ref 3.5–5.1)
Sodium: 147 mmol/L — ABNORMAL HIGH (ref 135–145)

## 2019-05-10 LAB — HEPARIN LEVEL (UNFRACTIONATED): Heparin Unfractionated: 0.38 IU/mL (ref 0.30–0.70)

## 2019-05-10 LAB — PROTIME-INR
INR: 1.3 — ABNORMAL HIGH (ref 0.8–1.2)
Prothrombin Time: 15.9 seconds — ABNORMAL HIGH (ref 11.4–15.2)

## 2019-05-10 MED ORDER — POTASSIUM CHLORIDE 10 MEQ/100ML IV SOLN
10.0000 meq | INTRAVENOUS | Status: AC
  Administered 2019-05-10 (×6): 10 meq via INTRAVENOUS
  Filled 2019-05-10 (×6): qty 100

## 2019-05-10 NOTE — Progress Notes (Signed)
[facility]/Trauma Progress Note      Assessment/Plan UTI-DIPHTHEROIDS(CORYNEBACTERIUM SPECIES, sepsis 2/2 to UTI? A fib on coumadin, INR 1.3 Dementia HTN AV regurg COVIDnegative -05/05/2019  SBO-Hx small bowel obstruction 03/01/19/small bowel resection 2004,Hx appendectomy 2004 - Small bowel protocol.repeat AXR yesterday showed continued SBO - Clinically he has no evidence of bowel compromise and abdominal exam is improved. Pt states flatus but if he continues to show pattern of SBO may require laparotomy this admission - pt states flatus this am, AXR pending   FEN -NPO, NGT VTE -hold coumadin ID -maxipimefor uti. DVT: INR 1.32, heparin  POC: Mccutchen,[name] Spouse [phone]  [phone]  MOORFIELD-LANG,[name] Daughter   [phone]      LOS: 5 days    Subjective: CC: SBO  Pt denies abdominal pain. He states he had flatus this am about 3 or so hours ago. He has no new complaints.   Objective: Vital signs in last 24 hours: Temp:  [98.1 F (36.7 C)-98.6 F (37 C)] 98.6 F (37 C) (06/29 0417) Pulse Rate:  [100-101] 100 (06/29 0417) BP: (117-120)/(78-88) 117/78 (06/29 0417) Weight:  [80.9 kg] 80.9 kg (06/29 0433) Last BM Date: (pta )  Intake/Output from previous day: 06/28 0701 - 06/29 0700 In: 960 [I.V.:234.9; IV Piggyback:725.1] Out: 4696 [Urine:1050; Emesis/NG output:1400] Intake/Output this shift: No intake/output data recorded.  PE: Gen:  Alert, NAD, pleasant, cooperative HEENT: NGT in place, 1400cc output in last 24hrs Pulm:  Rate and effort normal Abd: Soft, NT/ND, +BS, previously well healed midline scar Skin: no rashes noted, warm and dry   Anti-infectives: Anti-infectives (From admission, onward)   Start     Dose/Rate Route Frequency Ordered Stop   05/06/19 2100  vancomycin (VANCOCIN) 1,250 mg in sodium chloride 0.9 % 250 mL IVPB  Status:  Discontinued     1,250 mg 166.7 mL/hr over 90 Minutes Intravenous  Every 24 hours 05/05/19 1849 05/05/19 2057   05/06/19 0800  ceFEPIme (MAXIPIME) 2 g in sodium chloride 0.9 % 100 mL IVPB  Status:  Discontinued     2 g 200 mL/hr over 30 Minutes Intravenous Every 12 hours 05/05/19 1849 05/05/19 2057   05/06/19 0800  ceFEPIme (MAXIPIME) 2 g in sodium chloride 0.9 % 100 mL IVPB     2 g 200 mL/hr over 30 Minutes Intravenous Every 12 hours 05/05/19 2114     05/05/19 1830  ceFEPIme (MAXIPIME) 2 g in sodium chloride 0.9 % 100 mL IVPB     2 g 200 mL/hr over 30 Minutes Intravenous  Once 05/05/19 1817 05/05/19 2019   05/05/19 1830  metroNIDAZOLE (FLAGYL) IVPB 500 mg     500 mg 100 mL/hr over 60 Minutes Intravenous  Once 05/05/19 1817 05/05/19 2011   05/05/19 1830  vancomycin (VANCOCIN) IVPB 1000 mg/200 mL premix  Status:  Discontinued     1,000 mg 200 mL/hr over 60 Minutes Intravenous  Once 05/05/19 1817 05/05/19 1825   05/05/19 1830  vancomycin (VANCOCIN) 1,500 mg in sodium chloride 0.9 % 500 mL IVPB     1,500 mg 250 mL/hr over 120 Minutes Intravenous  Once 05/05/19 1825 05/05/19 2238      Lab Results:  Recent Labs    05/09/19 0609 05/10/19 0337  WBC 8.5 8.9  HGB 10.5* 10.8*  HCT 34.8* 35.2*  PLT 292 303   BMET Recent Labs    05/09/19 0609 05/10/19 0337  NA 148* 147*  K 2.6* 3.0*  CL 106 107  CO2 26 26  GLUCOSE 103*  92  BUN 15 18  CREATININE 0.92 0.86  CALCIUM 9.8 9.6   PT/INR Recent Labs    05/09/19 1444 05/10/19 0337  LABPROT 16.2* 15.9*  INR 1.3* 1.3*   CMP     Component Value Date/Time   NA 147 (H) 05/10/2019 0337   NA 135 07/16/2017 1348   K 3.0 (L) 05/10/2019 0337   CL 107 05/10/2019 0337   CO2 26 05/10/2019 0337   GLUCOSE 92 05/10/2019 0337   BUN 18 05/10/2019 0337   BUN 13 07/16/2017 1348   CREATININE 0.86 05/10/2019 0337   CALCIUM 9.6 05/10/2019 0337   CALCIUM 8.2 (L) 02/10/2019 0302   PROT 8.6 (H) 05/05/2019 1707   ALBUMIN 3.4 (L) 05/05/2019 1707   AST 17 05/05/2019 1707   ALT 14 05/05/2019 1707   ALKPHOS 109  05/05/2019 1707   BILITOT 0.3 05/05/2019 1707   GFRNONAA >60 05/10/2019 0337   GFRAA >60 05/10/2019 0337   Lipase     Component Value Date/Time   LIPASE 24 05/05/2019 1707    Studies/Results: Dg Abd Portable 1v  Result Date: 05/09/2019 CLINICAL DATA:  Small bowel obstruction EXAM: PORTABLE ABDOMEN - 1 VIEW COMPARISON:  Yesterday FINDINGS: Persistent dilated small bowel with colonic collapse. Stomach is moderately distended. Artifact from EKG leads. When correlated with prior the nasogastric tube is still likely in place with tip at the fundus of the stomach. No concerning mass effect or gas collection. IMPRESSION: Small bowel obstruction without appreciable change from yesterday. Electronically Signed   By: Monte Fantasia M.D.   On: 05/09/2019 07:07      Kalman Drape , [facility] 05/10/2019, 9:16 AM  Pager: [phone] Mon-Wed, Friday 7:00am-4:30pm Thurs 7am-11:30am  Consults: [phone]

## 2019-05-10 NOTE — Progress Notes (Signed)
Pt's wife is at bedside. She had no questions or concerns. She stated that the doctors had just stopped by and updated her on the plan for pt's care.

## 2019-05-10 NOTE — Consult Note (Signed)
   [facility] CM Inpatient Consult   05/10/2019  Russell Dillon 1946/04/18 [id number]  Patient screened for high risk score[25%]  for unplanned readmission score  and for 4 hospitalizations in the past 6 months noted. Review of patient's medical record reveals patient is as follows from a portion of  MD notes of History and Physical: Russell Dillon is a 73 y.o. male with medical history significant of chronic atrial fibrillation on anticoagulation, HTN, HLD, diastolic dysfunction with AV regurgitation, prior SBO, BPH, and dementia; who presents with complaints of abdominal pain.  History is obtained from the patient's wife who is present at bedside as he has dementia.  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  Last week patient had been found to have a urinary tract infection treated with Bactrim by his PCP which was completed just 2 days ago [prior to admission on 05/05/2019].  Patient admitted with sepsis secondary to UTI with a small bowel obstruction noted per CT scan.   Primary Care Provider is  Marton Redwood  Plan: Follow up with inpatient [facility] team for progression and disposition needs   Please place a [facility] Care Management consult as appropriate and for questions contact:   Natividad Brood, RN BSN [facility] Liaison  [phone] business mobile phone Toll free office [phone]  Fax number: [phone] [email or web address]

## 2019-05-10 NOTE — Progress Notes (Signed)
[place] for Heparin Indication: atrial fibrillation  No Known Allergies  Patient Measurements: Height: 6' (182.9 cm) Weight: 178 lb 5.6 oz (80.9 kg) IBW/kg (Calculated) : 77.6  Vital Signs: Temp: 98.6 F (37 C) (06/29 0417) Temp Source: Oral (06/29 0417) BP: 117/78 (06/29 0417) Pulse Rate: 100 (06/29 0417)  Labs: Recent Labs    05/09/19 0609 05/09/19 1322 05/09/19 1444 05/09/19 2046 05/10/19 0337  HGB 10.5*  --   --   --  10.8*  HCT 34.8*  --   --   --  35.2*  PLT 292  --   --   --  303  LABPROT 21.5*  --  16.2*  --  15.9*  INR 1.9*  --  1.3*  --  1.3*  HEPARINUNFRC 0.16* 0.21*  --  0.36 0.38  CREATININE 0.92  --   --   --  0.86    Estimated Creatinine Clearance: 82.7 mL/min (by C-G formula based on SCr of 0.86 mg/dL).   Medications:  Coumadin PTA regimen: 5mg  MWF; 2.5mg  TTSaSu  Assessment: 73 year old male admitted 5/7 with pyelonephritis and E coli bacteremia. He is on chronic anticoagulation with Coumadin for atrial fibrillation. His INR was supratherapeutic on admission, likely due to acute illness and recent Levaquin therapy. SBO->NPO, no surgery for now, NGT in place, may need laparatomy this admission. Coumadin on hold and started on IV Heparin drip once INR <2.  Heparin level= 0.38 units/ml, therapeutic on heparin rate 1350 units/hr. INR 1.3,  hgb stable 10.8 and pltc within normal. No bleeding noted  Goal of Therapy:  INR 2-3 Monitor platelets by anticoagulation protocol: Yes  Heparin level = 0.3 to 0.7   Plan:  Continue heparin at 1350 units/hr Daily heparin level, CBC Daily PT/INR  Monitor for bleeding F/u restart of Coumadin  Thank you Nicole Cella, RPh Clinical Pharmacist [phone] Please check AMION for all [place] phone numbers After 10:00 PM, call [place] [phone] 05/10/2019 10:55 AM

## 2019-05-10 NOTE — Care Management Important Message (Signed)
Important Message  Patient Details  Name: Russell Dillon MRN: [id number] Date of Birth: 25-Jun-1946   Medicare Important Message Given:  Yes     Kass Herberger Montine Circle 05/10/2019, 4:41 PM

## 2019-05-10 NOTE — Progress Notes (Addendum)
PROGRESS NOTE  Russell Dillon ZJQ:[id number] DOB: 24-Jan-1946 DOA: 05/05/2019 PCP: Marton Redwood, MD  Brief History   The patient is a 73 yr old man who was admitted through the ED with a UTI and a bowel obstruction with an apparent transition point. General surgery has been consulted and was following. An attempt was made to relieve the obstruction conservatively with keeping the patient NPO, but was unsuccessful. Placement of NGT was delayed to some degree due to the patient's elevated INR despite warfarin being held. The patient has received 6 units of FFP and IV vitamin K. NGT was placed yesterday and is yielding copious GI aspirate. He is receiving IV cefepime. His heart rate has been high due his atrial fibrillation. He has been placed on a diltiazem drip. Daily x-rays of the abdomen has not revealed any progress in resolving the obstruction. General surgery wants to give the patient one more day to resolve the obstruction.  Consultants  . General Surgery  Procedures  . NGT place  Antibiotics  . Cefepime  Subjective  The patient is awake and alert. No acute distress.  Objective   Vitals:  Vitals:   05/10/19 0019 05/10/19 0417  BP: 120/88 117/78  Pulse: (!) 101 100  Resp:    Temp: 98.1 F (36.7 C) 98.6 F (37 C)  SpO2:      Exam:  Constitutional:  . The patient is calm, awake, and alert. No acute distress. Wife is at bedside. Respiratory:  . No increased work of breathing  . No wheezes, rales, or rhonchi.  . No tactile fremitus Cardiovascular:  . Regular rate and rhythm. . No murmurs ectopy of gallups. . No lateral PMI. No thrills.   Abdomen:  . Abdomen is less distended this morning. Mild tenderness elicited with palpation. . No hernias, masses, or organomegaly are appreciated. Marland Kitchen Hypoactive bowel sounds. Musculoskeletal:  . No cyanosis, clubbing, or edema. Skin:  . No rashes, lesions, ulcers . palpation of skin: no induration or nodules .  Neurologic:  Unable to evaluate due to the patient's inability to participate with exam.  Psychiatric:  Unable to evaluate due to the patient's inability to participate with exam.  I have personally reviewed the following:   Today's Data  . BMP, CBC, Vitals, PT/INR  Imaging  . 1 view abdomen: Dilated loops of bowel, stomach,and transition zone compatible with a high grade obstruction. .   Scheduled Meds: . sodium chloride   Intravenous Once  . sodium chloride flush  3 mL Intravenous Q12H   Continuous Infusions: . 0.9 % NaCl with KCl 20 mEq / L 100 mL/hr at 05/10/19 0733  . ceFEPime (MAXIPIME) IV 2 g (05/10/19 0850)  . diltiazem (CARDIZEM) infusion 10 mg/hr (05/10/19 1733)  . heparin 1,350 Units/hr (05/10/19 0735)    Principal Problem:   Sepsis secondary to UTI [facility]) Active Problems:   Atrial fibrillation, chronic   Long term (current) use of anticoagulants   SBO (small bowel obstruction) (HCC)   Dementia without behavioral disturbance (HCC)   AKI (acute kidney injury) ([place])   Transient hypotension   LOS: 5 days    A & P   Sepsis 2/2 UTI : Patient presents tachycardic, WBC 17, and lactic acid elevated at 3.6.  Urinalysis concerning for possible source of infection.  Although symptoms could be stemming from SBO.  Patient had been empirically given antibiotics of vancomycin, cefepime, and metronidazole.  During last hospitalization in May patient was noted to be positive for E. coli  that was resistant to ciprofloxacin and ampicillin/sulbactam, but sensitive to Rocephin.  the patient was admitted to a medical telemetry bed. Blood and urine cultures have been obtained. He will be placed on empiric cefepime pending culture results. Lactic acid was trended.  Small bowel obstruction: Acute.  Patient complained of abdominal pain prior to arrival and had one episode of vomiting.  CT scan shows small bowel obstruction with transition trauma in the mid left hemiabdomen.  Risk factors include  previous history of small bowel obstruction related with adhesions. The patient remains NPO. General surgery has seen the patient. As the patient's INR remains 3, they are holding off on placing NGT. Coumadin has now been held and the patient is on a heparin drip pending possible surgical intervention. Continue IV fluids and NPO status. I have discussed the patient with surgery. NGT needs to be placed to decompress the bowel, but patient's INR is 2.8. The patient has received 6 units of FFP as well as IV vitamin K. Repeat INR is 1.9 this morning.I appreciate general surgery's assistance. General surgery wants to wait another 24 hours to allow the obstruction to resolve before surgery is considered.  Transient hypotension: Resolved.  Blood pressures currently appears stable. IV fluids as seen above bolus IV fluids as needed.  Acute kidney injury: Patient baseline creatinine noted to be 0.7.  He presents with creatinine elevated to 1.56 with BUN 17. Creatinine was 0.86 this morning.   Hypokalemia: Potassium 2.6 this morning. Supplement and Monitor.  Atrial fibrillation on anticoagulation: Chronic.  INR currently supertherapeutic at 3.1. CHA2DS2-VASc score = at least 3. Warfarin held pending possible necessary surgical intervention. Heparin drip per pharmacy once NGT is stable. Diltiazem drip has been started and continued for ongoing RVR.  Dementia with behavioral disturbance: Patient on medications of Aricept at home.   I have seen and examined this patient myself. I have spent 42 minutes in his evaluation and care. I have discussed the patient in detail with his wife who is at bedside. All questions answered to the best of my ability.  DVT prophylaxis: Heparin Code Status: Full Family Communication: Discussed plan of care with the patient's wife who is at the patient's bedside this morning. Disposition Plan: Possible discharge home in 2 to 3 days  Latesia Norrington, DO [facility]  contact: see [email or web address]  7PM-7AM contact night coverage as above 05/10/2019, 5:36 PM  LOS: 1 day

## 2019-05-11 ENCOUNTER — Inpatient Hospital Stay: Payer: Self-pay

## 2019-05-11 ENCOUNTER — Inpatient Hospital Stay (HOSPITAL_COMMUNITY): Payer: Medicare Other

## 2019-05-11 ENCOUNTER — Encounter (HOSPITAL_COMMUNITY)
Admission: EM | Disposition: A | Discharge status: Home Health | Payer: Self-pay | Source: Home / Self Care | Attending: Internal Medicine

## 2019-05-11 ENCOUNTER — Inpatient Hospital Stay (HOSPITAL_COMMUNITY): Payer: Medicare Other | Admitting: Certified Registered"

## 2019-05-11 ENCOUNTER — Encounter (HOSPITAL_COMMUNITY): Payer: Self-pay | Admitting: Orthopedic Surgery

## 2019-05-11 DIAGNOSIS — I959 Hypotension, unspecified: Secondary | ICD-10-CM

## 2019-05-11 HISTORY — PX: LAPAROTOMY: SHX154

## 2019-05-11 HISTORY — PX: LYSIS OF ADHESION: SHX5961

## 2019-05-11 LAB — BASIC METABOLIC PANEL
Anion gap: 12 (ref 5–15)
BUN: 23 mg/dL (ref 8–23)
CO2: 24 mmol/L (ref 22–32)
Calcium: 9.8 mg/dL (ref 8.9–10.3)
Chloride: 111 mmol/L (ref 98–111)
Creatinine, Ser: 0.9 mg/dL (ref 0.61–1.24)
GFR calc Af Amer: >60 mL/min (ref >60)
GFR calc non Af Amer: >60 mL/min (ref >60)
Glucose, Bld: 92 mg/dL (ref 70–99)
Potassium: 3.7 mmol/L (ref 3.5–5.1)
Sodium: 147 mmol/L — ABNORMAL HIGH (ref 135–145)

## 2019-05-11 LAB — POCT I-STAT 7, (LYTES, BLD GAS, ICA,H+H)
Acid-base deficit: 1 mmol/L (ref 0.0–2.0)
Bicarbonate: 24.4 mmol/L (ref 20.0–28.0)
Calcium, Ion: 1.33 mmol/L (ref 1.15–1.40)
HCT: 33 % — ABNORMAL LOW (ref 39.0–52.0)
Hemoglobin: 11.2 g/dL — ABNORMAL LOW (ref 13.0–17.0)
O2 Saturation: 99 %
Patient temperature: 35.3
Potassium: 3.5 mmol/L (ref 3.5–5.1)
Sodium: 148 mmol/L — ABNORMAL HIGH (ref 135–145)
TCO2: 26 mmol/L (ref 22–32)
pCO2 arterial: 39 mmHg (ref 32.0–48.0)
pH, Arterial: 7.397 (ref 7.350–7.450)
pO2, Arterial: 144 mmHg — ABNORMAL HIGH (ref 83.0–108.0)

## 2019-05-11 LAB — MRSA PCR SCREENING: MRSA by PCR: NEGATIVE

## 2019-05-11 LAB — HEPARIN LEVEL (UNFRACTIONATED): Heparin Unfractionated: 0.4 IU/mL (ref 0.30–0.70)

## 2019-05-11 LAB — PREPARE RBC (CROSSMATCH)

## 2019-05-11 SURGERY — LAPAROTOMY, EXPLORATORY
Anesthesia: General | Site: Abdomen

## 2019-05-11 MED ORDER — METHOCARBAMOL 1000 MG/10ML IJ SOLN
500.0000 mg | Freq: Three times a day (TID) | INTRAVENOUS | Status: DC | PRN
  Filled 2019-05-11: qty 5

## 2019-05-11 MED ORDER — LACTATED RINGERS IV SOLN
INTRAVENOUS | Status: DC | PRN
  Administered 2019-05-11: 14:00:00 via INTRAVENOUS

## 2019-05-11 MED ORDER — VASOPRESSIN 20 UNIT/ML IV SOLN
0.0300 [IU]/min | INTRAVENOUS | Status: DC
  Filled 2019-05-11: qty 2

## 2019-05-11 MED ORDER — FENTANYL CITRATE (PF) 250 MCG/5ML IJ SOLN
INTRAMUSCULAR | Status: AC
  Filled 2019-05-11: qty 5

## 2019-05-11 MED ORDER — LIDOCAINE 2% (20 MG/ML) 5 ML SYRINGE
INTRAMUSCULAR | Status: AC
  Filled 2019-05-11: qty 20

## 2019-05-11 MED ORDER — FENTANYL CITRATE (PF) 100 MCG/2ML IJ SOLN
12.5000 ug | INTRAMUSCULAR | Status: DC | PRN
  Administered 2019-05-12 – 2019-05-18 (×4): 12.5 ug via INTRAVENOUS
  Filled 2019-05-11 (×4): qty 2

## 2019-05-11 MED ORDER — ROCURONIUM BROMIDE 10 MG/ML (PF) SYRINGE
PREFILLED_SYRINGE | INTRAVENOUS | Status: AC
  Filled 2019-05-11: qty 40

## 2019-05-11 MED ORDER — SODIUM CHLORIDE 0.9 % IV SOLN
INTRAVENOUS | Status: DC | PRN
  Administered 2019-05-11: 14:00:00 25 ug/min via INTRAVENOUS

## 2019-05-11 MED ORDER — VASOPRESSIN 20 UNIT/ML IV SOLN
INTRAVENOUS | Status: DC | PRN
  Administered 2019-05-11: 15:00:00 0.1 [IU]/min via INTRAVENOUS

## 2019-05-11 MED ORDER — DEXAMETHASONE SODIUM PHOSPHATE 10 MG/ML IJ SOLN
INTRAMUSCULAR | Status: DC | PRN
  Administered 2019-05-11: 10 mg via INTRAVENOUS

## 2019-05-11 MED ORDER — EPHEDRINE 5 MG/ML INJ
INTRAVENOUS | Status: AC
  Filled 2019-05-11: qty 10

## 2019-05-11 MED ORDER — ROCURONIUM BROMIDE 10 MG/ML (PF) SYRINGE
PREFILLED_SYRINGE | INTRAVENOUS | Status: AC
  Filled 2019-05-11: qty 10

## 2019-05-11 MED ORDER — ROCURONIUM BROMIDE 10 MG/ML (PF) SYRINGE
PREFILLED_SYRINGE | INTRAVENOUS | Status: DC | PRN
  Administered 2019-05-11: 40 mg via INTRAVENOUS
  Administered 2019-05-11: 50 mg via INTRAVENOUS

## 2019-05-11 MED ORDER — ETOMIDATE 2 MG/ML IV SOLN
INTRAVENOUS | Status: AC
  Filled 2019-05-11: qty 10

## 2019-05-11 MED ORDER — ALBUMIN HUMAN 5 % IV SOLN
INTRAVENOUS | Status: DC | PRN
  Administered 2019-05-11 (×2): via INTRAVENOUS

## 2019-05-11 MED ORDER — DEXAMETHASONE SODIUM PHOSPHATE 10 MG/ML IJ SOLN
INTRAMUSCULAR | Status: AC
  Filled 2019-05-11: qty 5

## 2019-05-11 MED ORDER — SUGAMMADEX SODIUM 200 MG/2ML IV SOLN
INTRAVENOUS | Status: DC | PRN
  Administered 2019-05-11: 160.2 mg via INTRAVENOUS

## 2019-05-11 MED ORDER — METOPROLOL TARTRATE 5 MG/5ML IV SOLN
INTRAVENOUS | Status: AC
  Filled 2019-05-11: qty 5

## 2019-05-11 MED ORDER — CEFAZOLIN SODIUM-DEXTROSE 2-3 GM-%(50ML) IV SOLR
INTRAVENOUS | Status: DC | PRN
  Administered 2019-05-11: 2 g via INTRAVENOUS

## 2019-05-11 MED ORDER — ONDANSETRON HCL 4 MG/2ML IJ SOLN
INTRAMUSCULAR | Status: AC
  Filled 2019-05-11: qty 8

## 2019-05-11 MED ORDER — LACTATED RINGERS IV SOLN
INTRAVENOUS | Status: DC
  Administered 2019-05-11: 17:00:00 via INTRAVENOUS

## 2019-05-11 MED ORDER — FENTANYL CITRATE (PF) 250 MCG/5ML IJ SOLN
INTRAMUSCULAR | Status: DC | PRN
  Administered 2019-05-11: 100 ug via INTRAVENOUS

## 2019-05-11 MED ORDER — SUCCINYLCHOLINE CHLORIDE 20 MG/ML IJ SOLN
INTRAMUSCULAR | Status: DC | PRN
  Administered 2019-05-11: 160 mg via INTRAVENOUS

## 2019-05-11 MED ORDER — LACTATED RINGERS IV SOLN
INTRAVENOUS | Status: DC
  Administered 2019-05-11: 13:00:00 via INTRAVENOUS

## 2019-05-11 MED ORDER — PHENYLEPHRINE 40 MCG/ML (10ML) SYRINGE FOR IV PUSH (FOR BLOOD PRESSURE SUPPORT)
PREFILLED_SYRINGE | INTRAVENOUS | Status: AC
  Filled 2019-05-11: qty 40

## 2019-05-11 MED ORDER — LIDOCAINE 2% (20 MG/ML) 5 ML SYRINGE
INTRAMUSCULAR | Status: DC | PRN
  Administered 2019-05-11: 100 mg via INTRAVENOUS

## 2019-05-11 MED ORDER — SUCCINYLCHOLINE CHLORIDE 200 MG/10ML IV SOSY
PREFILLED_SYRINGE | INTRAVENOUS | Status: AC
  Filled 2019-05-11: qty 60

## 2019-05-11 MED ORDER — SODIUM CHLORIDE 0.9% IV SOLUTION: Freq: Once | INTRAVENOUS | Status: DC

## 2019-05-11 MED ORDER — ORAL CARE MOUTH RINSE
15.0000 mL | Freq: Two times a day (BID) | OROMUCOSAL | Status: DC
  Administered 2019-05-11 – 2019-05-19 (×17): 15 mL via OROMUCOSAL

## 2019-05-11 MED ORDER — PHENYLEPHRINE 40 MCG/ML (10ML) SYRINGE FOR IV PUSH (FOR BLOOD PRESSURE SUPPORT)
PREFILLED_SYRINGE | INTRAVENOUS | Status: DC | PRN
  Administered 2019-05-11: 120 ug via INTRAVENOUS
  Administered 2019-05-11 (×4): 80 ug via INTRAVENOUS

## 2019-05-11 MED ORDER — ACETAMINOPHEN 10 MG/ML IV SOLN
1000.0000 mg | Freq: Four times a day (QID) | INTRAVENOUS | Status: AC
  Administered 2019-05-11 – 2019-05-12 (×4): 1000 mg via INTRAVENOUS
  Filled 2019-05-11 (×4): qty 100

## 2019-05-11 MED ORDER — DILTIAZEM LOAD VIA INFUSION
INTRAVENOUS | Status: DC | PRN
  Administered 2019-05-11 (×2): 5 mg via INTRAVENOUS

## 2019-05-11 MED ORDER — METOPROLOL TARTRATE 5 MG/5ML IV SOLN
INTRAVENOUS | Status: DC | PRN
  Administered 2019-05-11: 2 mg via INTRAVENOUS

## 2019-05-11 MED ORDER — VASOPRESSIN 20 UNIT/ML IV SOLN
INTRAVENOUS | Status: AC
  Filled 2019-05-11: qty 1

## 2019-05-11 MED ORDER — 0.9 % SODIUM CHLORIDE (POUR BTL) OPTIME
TOPICAL | Status: DC | PRN
  Administered 2019-05-11: 2000 mL
  Administered 2019-05-11 (×2): 1000 mL

## 2019-05-11 MED ORDER — PROPOFOL 10 MG/ML IV BOLUS
INTRAVENOUS | Status: DC | PRN
  Administered 2019-05-11: 80 mg via INTRAVENOUS

## 2019-05-11 MED ORDER — ONDANSETRON HCL 4 MG/2ML IJ SOLN
INTRAMUSCULAR | Status: DC | PRN
  Administered 2019-05-11: 4 mg via INTRAVENOUS

## 2019-05-11 MED ORDER — VASOPRESSIN 20 UNIT/ML IV SOLN
INTRAVENOUS | Status: DC | PRN
  Administered 2019-05-11: 2 [IU] via INTRAVENOUS

## 2019-05-11 MED ORDER — CHLORHEXIDINE GLUCONATE CLOTH 2 % EX PADS
6.0000 | MEDICATED_PAD | Freq: Every day | CUTANEOUS | Status: DC
  Administered 2019-05-11: 6 via TOPICAL

## 2019-05-11 SURGICAL SUPPLY — 51 items
APL PRP STRL LF DISP 70% ISPRP (MISCELLANEOUS) ×1
BLADE 10 SAFETY STRL DISP (BLADE) ×1 IMPLANT
BLADE CLIPPER SURG (BLADE) ×2 IMPLANT
BNDG GAUZE ELAST 4 BULKY (GAUZE/BANDAGES/DRESSINGS) ×2 IMPLANT
CANISTER SUCT 3000ML PPV (MISCELLANEOUS) ×3 IMPLANT
CHLORAPREP W/TINT 26 (MISCELLANEOUS) ×3 IMPLANT
COVER MAYO STAND STRL (DRAPES) ×2 IMPLANT
COVER SURGICAL LIGHT HANDLE (MISCELLANEOUS) ×3 IMPLANT
COVER WAND RF STERILE (DRAPES) ×3 IMPLANT
DRAPE LAPAROSCOPIC ABDOMINAL (DRAPES) ×3 IMPLANT
DRAPE WARM FLUID 44X44 (DRAPES) ×3 IMPLANT
DRSG OPSITE POSTOP 4X10 (GAUZE/BANDAGES/DRESSINGS) IMPLANT
DRSG OPSITE POSTOP 4X8 (GAUZE/BANDAGES/DRESSINGS) IMPLANT
ELECT BLADE 6.5 EXT (BLADE) ×2 IMPLANT
ELECT CAUTERY BLADE 6.4 (BLADE) ×2 IMPLANT
ELECT REM PT RETURN 9FT ADLT (ELECTROSURGICAL) ×3
ELECTRODE REM PT RTRN 9FT ADLT (ELECTROSURGICAL) ×1 IMPLANT
GLOVE BIO SURGEON STRL SZ7 (GLOVE) ×3 IMPLANT
GLOVE BIOGEL PI IND STRL 7.5 (GLOVE) ×1 IMPLANT
GLOVE BIOGEL PI INDICATOR 7.5 (GLOVE) ×2
GOWN STRL REUS W/ TWL LRG LVL3 (GOWN DISPOSABLE) ×2 IMPLANT
GOWN STRL REUS W/TWL LRG LVL3 (GOWN DISPOSABLE) ×9
KIT BASIN OR (CUSTOM PROCEDURE TRAY) ×3 IMPLANT
KIT TURNOVER KIT B (KITS) ×3 IMPLANT
LIGASURE IMPACT 36 18CM CVD LR (INSTRUMENTS) IMPLANT
NS IRRIG 1000ML POUR BTL (IV SOLUTION) ×6 IMPLANT
PACK GENERAL/GYN (CUSTOM PROCEDURE TRAY) ×1 IMPLANT
PACK LAPAROSCOPY BASIN (CUSTOM PROCEDURE TRAY) ×2 IMPLANT
PAD ABD 8X10 STRL (GAUZE/BANDAGES/DRESSINGS) ×2 IMPLANT
PAD ARMBOARD 7.5X6 YLW CONV (MISCELLANEOUS) ×3 IMPLANT
PENCIL SMOKE EVACUATOR (MISCELLANEOUS) ×3 IMPLANT
SPECIMEN JAR LARGE (MISCELLANEOUS) IMPLANT
SPONGE LAP 18X18 RF (DISPOSABLE) ×2 IMPLANT
STAPLER VISISTAT 35W (STAPLE) ×1 IMPLANT
SUCTION POOLE TIP (SUCTIONS) ×3 IMPLANT
SUT NOVA 1 T20/GS 25DT (SUTURE) ×2 IMPLANT
SUT PDS AB 1 TP1 96 (SUTURE) ×6 IMPLANT
SUT SILK 2 0 (SUTURE) ×3
SUT SILK 2 0 SH CR/8 (SUTURE) ×3 IMPLANT
SUT SILK 2-0 18XBRD TIE 12 (SUTURE) ×1 IMPLANT
SUT SILK 3 0 (SUTURE) ×3
SUT SILK 3 0 SH CR/8 (SUTURE) ×3 IMPLANT
SUT SILK 3-0 18XBRD TIE 12 (SUTURE) ×1 IMPLANT
SUT VIC AB 3-0 SH 27 (SUTURE)
SUT VIC AB 3-0 SH 27X BRD (SUTURE) IMPLANT
TAPE CLOTH SURG 6X10 WHT LF (GAUZE/BANDAGES/DRESSINGS) ×2 IMPLANT
TOWEL OR 17X26 10 PK STRL BLUE (TOWEL DISPOSABLE) ×3 IMPLANT
TRAY FOLEY MTR SLVR 16FR STAT (SET/KITS/TRAYS/PACK) ×5 IMPLANT
TUBE CONNECTING 12'X1/4 (SUCTIONS) ×1
TUBE CONNECTING 12X1/4 (SUCTIONS) ×1 IMPLANT
YANKAUER SUCT BULB TIP NO VENT (SUCTIONS) ×2 IMPLANT

## 2019-05-11 NOTE — Anesthesia Procedure Notes (Signed)
Procedure Name: Intubation Date/Time: 05/11/2019 2:03 PM Performed by: Gaylene Brooks, CRNA Pre-anesthesia Checklist: Patient identified, Emergency Drugs available, Suction available and Patient being monitored Patient Re-evaluated:Patient Re-evaluated prior to induction Oxygen Delivery Method: Circle System Utilized Preoxygenation: Pre-oxygenation with 100% oxygen Induction Type: IV induction and Rapid sequence Laryngoscope Size: Miller and 2 Grade View: Grade I Tube type: Oral Tube size: 7.5 mm Number of attempts: 1 Airway Equipment and Method: Stylet and Oral airway Placement Confirmation: ETT inserted through vocal cords under direct vision,  positive ETCO2 and breath sounds checked- equal and bilateral Secured at: 23 cm Tube secured with: Tape Dental Injury: Teeth and Oropharynx as per pre-operative assessment

## 2019-05-11 NOTE — Progress Notes (Signed)
Called pt's wife to update her on pt having surgery today. Also received verbal consent with Russell Dillon as a second witness.

## 2019-05-11 NOTE — Transfer of Care (Signed)
Immediate Anesthesia Transfer of Care Note  Patient: Russell Dillon  Procedure(s) Performed: EXPLORATORY LAPAROTOMY (N/A Abdomen) LYSIS OF ADHESION (N/A Abdomen)  Patient Location: PACU  Anesthesia Type:General  Level of Consciousness: awake, alert  and patient cooperative  Airway & Oxygen Therapy: Patient Spontanous Breathing  Post-op Assessment: Report given to RN and Post -op Vital signs reviewed and stable  Post vital signs: Reviewed and stable  Last Vitals:  Vitals Value Taken Time  BP 125/79 05/11/19 1600  Temp    Pulse 90 05/11/19 1606  Resp 13 05/11/19 1606  SpO2 89 % 05/11/19 1606  Vitals shown include unvalidated device data.  Last Pain:  Vitals:   05/11/19 0815  TempSrc:   PainSc: 0-No pain         Complications: Patient stable.

## 2019-05-11 NOTE — Anesthesia Postprocedure Evaluation (Signed)
Anesthesia Post Note  Patient: Russell Dillon  Procedure(s) Performed: EXPLORATORY LAPAROTOMY (N/A Abdomen) LYSIS OF ADHESION (N/A Abdomen)     Patient location during evaluation: PACU Anesthesia Type: General Level of consciousness: awake Pain management: pain level controlled Vital Signs Assessment: post-procedure vital signs reviewed and stable Respiratory status: spontaneous breathing, nonlabored ventilation, respiratory function stable and patient connected to nasal cannula oxygen Cardiovascular status: blood pressure returned to baseline and stable Postop Assessment: no apparent nausea or vomiting Anesthetic complications: no    Last Vitals:  Vitals:   05/11/19 1900 05/11/19 2000  BP: 112/75   Pulse: 95   Resp: 12   Temp:  36.7 C  SpO2: 97%     Last Pain:  Vitals:   05/11/19 2000  TempSrc: Oral  PainSc:                  Russell Dillon Russell Dillon

## 2019-05-11 NOTE — Consult Note (Signed)
NAME:  Russell Dillon, MRN:  [id number], DOB:  January 12, 1946, LOS: 6 ADMISSION DATE:  05/05/2019, CONSULTATION DATE:  05/11/2019 REFERRING MD:  Donne Hazel, CHIEF COMPLAINT:  Hypotension   Brief History   73 yo male admitted for UTI and small bowel obstruction, now s/p laparotomy for SBO with lysis of abdominal adhesions.  History of present illness   Patient is a 73 year old man with history of dementia and chronic A fib who presented to the ED on 6/24 complaining of several days of abdominal pain, and was admitted with a UTI and small bowel obstruction with an apparent transition point. Patient was kept NPO to relieve obstruction, which was unsuccessful. NG tube was placed on 6/26, which produced large volume bilious output. Patient has been septic with elevated WBC and lactic acid during this admission requiring fluids and vasopressors. Was taken to surgery on 6/30, where a laparotomy w/ lysis of large adhesion was performed without complications. Patient admitted to ICU for monitoring overnight because of hypotension earlier this admission and need for vasopressors during surgery and immediately post operatively.   Past Medical History   Past Medical History:  Diagnosis Date  . Aortic valve regurgitation 10/16/2018   Echo 07/28/2017:  EF 60-65, moderate AI, aortic root and ascending aorta mildly dilated (40 mm), ascending aorta 43 mm, MAC, mild MR, mild LAE, moderate RV enlargement, trivial TR // Echo 12/19: EF 60-65, normal wall motion, mild AI, moderate BAE, ascending aorta 43 mm, aortic root 39 mm  . BPH (benign prostatic hypertrophy)   . Chronic anticoagulation   . Chronic atrial fibrillation   . Diastolic dysfunction October 2010   Normal LV systolic function  . Hx SBO   . Hyperlipidemia   . Hypertension   . Patellar tendon rupture, left, initial encounter 02/10/2019  . Small bowel obstruction ([place])     Significant Hospital Events   6/30: Laparotomy w/ lysis of adhesions   Consults:  none  Procedures:  none  Significant Diagnostic Tests:  none  Micro Data:  Urine Cx: + for Corynebacterium Blood Cx: negative  Antimicrobials:  S/p empiric IV Cefepime  Interim history/subjective:  6/30: Patient admitted to ICU. Perfusion is clinically equivocal. Patient confused, although mentation is appropriate considering baseline dementia. Wife at bedside.  Objective   Blood pressure 109/68, pulse 97, temperature (!) 97 F (36.1 C), resp. rate 12, height 6' 0.01" (1.829 m), weight 80.1 kg, SpO2 94 %.        Intake/Output Summary (Last 24 hours) at 05/11/2019 1720 Last data filed at 05/11/2019 1545 Gross per 24 hour  Intake 6531.68 ml  Output 4600 ml  Net 1931.68 ml   Filed Weights   05/10/19 0433 05/11/19 0351 05/11/19 1249  Weight: 80.9 kg 80.1 kg 80.1 kg    Examination: General: no acute distress HEENT: NCAT, EOMI, PERRLA. Dry lips with flaking, tongue is moist. Tears appreciated. Lungs: CTA, no wheezes, rhonchi, rales Cardiovascular: RRR, no m/g/r Abdomen: soft, NT,ND, BS+ Extremities: No edema, cap refil 3-4 sec in digits, 5-6 seconds in knee caps Skin: no rashes, cool and dry Neuro: moves all 4 extremities GU: foley in place.   Resolved Hospital Problem list     Assessment & Plan:  Hypotension: Clinically the patient looks mildly volume down and is perfusing adequately. Patient was hypotensive on presentation to the ED, but resolved with IVF admission. Required vasopressors intraoperatively and post-operatively. -IV Lactated Ringers -d/c phenylephrine as BP were elevated to the 150s with equivocal peripheral perfusion  Sepsis 2/2 UTI/SBO: Patient afebrile and normotensive at this point. Blood cultures were negative. Urine cx grew corynebacterium. S/p empiric IV cefepime pending cultures -no further antimicrobials indicated  Small Bowel Obstruction:  -s/p laparotomy w/ lysis of adhesions  AKI: -Cr was 1.86 on admission, 0.9 this am   Chronic A fib on anticoagulation: -Diltiazem for RVR -Coumadin/heparin held for surgery  Dementia w/ behavioral disturbance: -patient is confused by non combative here -on Aricept at home, currently held  Best practice:  Diet: NPO Pain/Anxiety/Delirium protocol (if indicated):  VAP protocol (if indicated):  DVT prophylaxis: SCDs GI prophylaxis: none Glucose control: none Mobility: bed rest Code Status: full code Family Communication: wife at bedside Disposition: ICU  Labs   CBC: Recent Labs  Lab 05/05/19 1707 05/06/19 0200 05/07/19 0248 05/09/19 0609 05/10/19 0337 05/11/19 1516  WBC 17.0* 14.6* 13.9* 8.5 8.9  --   NEUTROABS  --   --  12.7*  --   --   --   HGB 12.6* 11.3* 11.4* 10.5* 10.8* 11.2*  HCT 40.9 36.2* 36.6* 34.8* 35.2* 33.0*  MCV 84.0 82.3 82.6 82.9 83.0  --   PLT 370 371 343 292 303  --     Basic Metabolic Panel: Recent Labs  Lab 05/05/19 1707 05/06/19 0200 05/07/19 0248 05/09/19 0609 05/10/19 0337 05/11/19 0430 05/11/19 1516  NA 139 139 141 148* 147* 147* 148*  K 4.8 4.8 3.8 2.6* 3.0* 3.7 3.5  CL 106 107 110 106 107 111  --   CO2 24 23 21* _0 --   GLUCOSE 140* 139* 135* 103* 92 92  --   BUN _1 --   CREATININE 1.56* 1.19 0.97 0.92 0.86 0.90  --   CALCIUM 10.0 9.3 9.6 9.8 9.6 9.8  --   MG 1.8  --   --   --   --   --   --    GFR: Estimated Creatinine Clearance: 79 mL/min (by C-G formula based on SCr of 0.9 mg/dL). Recent Labs  Lab 05/05/19 1712 05/05/19 1804 05/05/19 2241 05/06/19 0200 05/07/19 0248 05/09/19 0609 05/10/19 0337  WBC  --   --   --  14.6* 13.9* 8.5 8.9  LATICACIDVEN 3.6* 2.0* 3.0* 1.4  --   --   --     Liver Function Tests: Recent Labs  Lab 05/05/19 1707  AST 17  ALT 14  ALKPHOS 109  BILITOT 0.3  PROT 8.6*  ALBUMIN 3.4*   Recent Labs  Lab 05/05/19 1707  LIPASE 24   No results for input(s): AMMONIA in the last 168 hours.  ABG    Component Value Date/Time   PHART 7.397 05/11/2019  1516   PCO2ART 39.0 05/11/2019 1516   PO2ART 144.0 (H) 05/11/2019 1516   HCO3 24.4 05/11/2019 1516   TCO2 26 05/11/2019 1516   ACIDBASEDEF 1.0 05/11/2019 1516   O2SAT 99.0 05/11/2019 1516     Coagulation Profile: Recent Labs  Lab 05/08/19 0939 05/08/19 1624 05/09/19 0609 05/09/19 1444 05/10/19 0337  INR 2.1* 2.0* 1.9* 1.3* 1.3*    Cardiac Enzymes: No results for input(s): CKTOTAL, CKMB, CKMBINDEX, TROPONINI in the last 168 hours.  HbA1C: No results found for: HGBA1C  CBG: Recent Labs  Lab 05/05/19 1711  GLUCAP 129*    Review of Systems:   ROS not collected due to patient confusion  Past Medical History  He,  has a past medical history of Aortic valve regurgitation (10/16/2018), BPH (  benign prostatic hypertrophy), Chronic anticoagulation, Chronic atrial fibrillation, Diastolic dysfunction (October 2010), SBO, Hyperlipidemia, Hypertension, Patellar tendon rupture, left, initial encounter (02/10/2019), and Small bowel obstruction ([place]).   Surgical History    Past Surgical History:  Procedure Laterality Date  . APPENDECTOMY  2004  . APPLICATION OF WOUND VAC Left 02/11/2019   Procedure: Application Of Wound Vac;  Surgeon: Altamese Shingletown, MD;  Location: [facility];  Service: Orthopedics;  Laterality: Left;  . CARDIOVASCULAR STRESS TEST  09/30/2007   EF 67%  No ischemia.   Marland Kitchen CARDIOVERSION  05/01/2004  . KNEE ARTHROSCOPY Left 02/11/2019   Procedure: ARTHROSCOPY LEFT KNEE;  Surgeon: Altamese [place] Komelik, MD;  Location: [facility];  Service: Orthopedics;  Laterality: Left;  . PATELLAR TENDON REPAIR Left 02/11/2019   Procedure: LEFT PATELLA TENDON REPAIR;  Surgeon: Altamese [place], MD;  Location: [facility];  Service: Orthopedics;  Laterality: Left;  . SMALL INTESTINE SURGERY  2004  . US ECHOCARDIOGRAPHY  09/05/2009   ef 55-60%     Social History   reports that he has never smoked. He has never used smokeless tobacco. He reports that he does not drink alcohol or use drugs.   Family History   His  family history includes Diabetes in his father; Heart attack in his mother; Heart disease in his father; Hypertension in his mother. There is no history of Colon cancer, Esophageal cancer, Pancreatic cancer, Prostate cancer, Rectal cancer, or Stomach cancer.   Allergies No Known Allergies   Home Medications  Prior to Admission medications   Medication Sig Start Date End Date Taking? Authorizing Provider  acetaminophen (TYLENOL) 325 MG tablet Take 2 tablets (650 mg total) by mouth every 6 (six) hours as needed for mild pain, fever or headache. 03/02/19  Yes Angiulli, Lavon Paganini, PA-C  Cholecalciferol (VITAMIN D-3) 25 MCG (1000 UT) CAPS Take 1,000 Units by mouth 2 (two) times a day.   Yes [provider]  colchicine 0.6 MG tablet Take 1 tablet (0.6 mg total) by mouth 2 (two) times daily. 03/25/19  Yes Dhungel, Nishant, MD  Cyanocobalamin (B-12 COMPLIANCE INJECTION) 1000 MCG/ML KIT Inject 1,000 mcg as directed every 30 (thirty) days.   Yes [provider]  diltiazem (CARDIZEM CD) 120 MG 24 hr capsule Take 1 capsule (120 mg total) by mouth daily. 03/25/19 03/24/20 Yes Black, Lezlie Octave, NP  docusate sodium (COLACE) 100 MG capsule Take 1 capsule (100 mg total) by mouth 2 (two) times daily. Patient taking differently: Take 100 mg by mouth every other day.  03/02/19  Yes Angiulli, Lavon Paganini, PA-C  donepezil (ARICEPT) 10 MG tablet Take 1 tablet (10 mg total) by mouth daily. Patient taking differently: Take 10 mg by mouth at bedtime.  03/02/19  Yes Angiulli, Lavon Paganini, PA-C  feeding supplement, ENSURE ENLIVE, (ENSURE ENLIVE) LIQD Take 237 mLs by mouth 2 (two) times daily between meals. Patient taking differently: Take 237 mLs by mouth 2 (two) times daily as needed (for supplementation). CHOCOLATE 03/22/19  Yes Regalado, Belkys A, MD  finasteride (PROSCAR) 5 MG tablet Take 1 tablet (5 mg total) by mouth every morning. 03/02/19  Yes Angiulli, Lavon Paganini, PA-C  lithium carbonate 150 MG capsule TAKE 1  CAPSULE(150 MG) BY MOUTH AT BEDTIME Patient taking differently: Take 150 mg by mouth at bedtime.  04/19/19  Yes Cottle, Billey Co., MD  Multiple Vitamins-Minerals (ICAPS AREDS 2 PO) Take 1 capsule by mouth daily.    Yes [provider]  Omega-3 Fatty Acids (OMEGA-3 FISH OIL  PO) Take 1 capsule by mouth daily with breakfast.    Yes [provider]  polyethylene glycol powder (GLYCOLAX/MIRALAX) 17 GM/SCOOP powder Take 17 g by mouth daily as needed for mild constipation.   Yes [provider]  warfarin (COUMADIN) 2.5 MG tablet Take 1 tablet (2.5 mg total) by mouth daily. Patient taking differently: Take 2.5 mg by mouth daily at 6 PM.  03/02/19  Yes Angiulli, Lavon Paganini, PA-C  allopurinol (ZYLOPRIM) 100 MG tablet Take 1 tablet (100 mg total) by mouth daily. Patient not taking: Reported on 05/05/2019 03/26/19   Radene Gunning, NP  HYDROcodone-acetaminophen (NORCO/VICODIN) 5-325 MG tablet Take 1-2 tablets by mouth every 4 (four) hours as needed for moderate pain (pain score 4-6). Patient not taking: Reported on 05/05/2019 03/02/19   Del[place]os, Lavon Paganini, PA-C     Critical care time: The patient is critically ill with multiple organ systems failure and requires high complexity decision making for assessment and support, frequent evaluation and titration of therapies, application of advanced monitoring technologies and extensive interpretation of multiple databases.  Critical care time 35 mins. This represents my time independent of the NPs time taking care of the pt. This is excluding procedures.    Thalia Bloodgood, [place] 05/11/2019, 5:20 PM

## 2019-05-11 NOTE — Op Note (Signed)
Preoperative diagnosis: Small bowel obstruction Postoperative diagnosis: Small bowel extra obstruction secondary to adhesions Procedure: Exploratory laparotomy with lysis of adhesions x1 hour Surgeon: Dr. Serita Grammes Assistant: Margie Billet, PA-C Anesthesia: General Estimated blood loss: 50 cc Complications: None Drains: None Specimens: None Sponge needle count was correct at completion Decision to ICU in stable condition  Indications: This is 73 year old male with multiple comorbidities who has prior abdominal surgeries including what appears to be a small bowel resection as well as lysis of adhesions for small bowel obstruction.  He has been managed nonoperatively and failed this.  I discussed with he and his wife going to the operating room for laparotomy and lysis of adhesions.  Procedure: After informed consent was obtained the patient was taken to the operating room.  His heparin had been stopped appropriately prior to the procedure.  Antibiotics were given.  SCDs were in place.  He was then placed under general endotracheal anesthesia without complication.  He was prepped and draped in the standard sterile surgical fashion.  A Foley catheter was placed by me.  His foreskin was very adherent to his glans.  It was very difficult due to the amount of material present around his glans to actually get this to come out and place a Foley.  In the end it took 3 of [place] using mosquito clamps to be able to place the Foley catheter.  We will leave this Foley catheter in for some time postoperatively.  Surgical timeout was then performed.  I made a midline incision that used his old incision as well as a little bit more superiorly and inferiorly.  I was able to enter the peritoneal cavity superiorly.  He had a small epigastric hernia which I included in the incision.  I excised this fat.  I then proceeded to open the entire incision.  He did not have a lot of adhesions to his incision.  His bowel was  noted to be very dilated upwards of 8 cm.  I then spent the next hour lysing the adhesions.  I did this mostly sharply with some cautery.  Eventually I was able to identify the ligament of Treitz and follow this through the terminal ileum.  There was what appeared to be a nest of bowel in the left lower quadrant with one very significant adhesion that was the source of the obstruction.  Initially I thought he might actually have a stricture at this area but I viewed it several times and I think it was just where the bowel was kinked.  The bowel was all viable.  I did milk some of this back up into the stomach that was evacuated by the nasogastric tube.  I examined the bowel several times and there was without injury.  There were a couple of interloop adhesions that I did not take down as the bowel was fused to each other but otherwise the bowel was completely lysed.  Irrigation was performed.  I then closed this with #1 looped PDS using some intermittent #1 Novafil sutures.  A wet-to-dry was placed.  He tolerated this well.  Due to his atrial fibrillation and need for pressors he is going to be monitored in the ICU overnight.  I have tried to call his wife and just got her voicemail.

## 2019-05-11 NOTE — Progress Notes (Signed)
PROGRESS NOTE  Russell Dillon:[id number] DOB: 1946/05/28 DOA: 05/05/2019 PCP: Marton Redwood, MD  Brief History   The patient is a 73 yr old man who was admitted through the ED with a UTI and a bowel obstruction with an apparent transition point. General surgery has been consulted and was following. An attempt was made to relieve the obstruction conservatively with keeping the patient NPO, but was unsuccessful. Placement of NGT was delayed to some degree due to the patient's elevated INR despite warfarin being held. The patient has received 6 units of FFP and IV vitamin K. NGT was placed yesterday and is yielding copious GI aspirate. He is receiving IV cefepime. His heart rate has been high due his atrial fibrillation. He has been placed on a diltiazem drip. Daily x-rays of the abdomen has not revealed any progress in resolving the obstruction. General surgery will be taking the patient to surgery later today. Consultants  . General Surgery  Procedures  . NGT place  Antibiotics  . Cefepime  Subjective  The patient is awake and alert. No acute distress.  Objective   Vitals:  Vitals:   05/11/19 1730 05/11/19 1745  BP: 107/68 103/67  Pulse: 90 99  Resp: 11 15  Temp:    SpO2: 93% 95%    Exam:  Constitutional:  . The patient is calm, awake, and alert. No acute distress. Wife is at bedside. Respiratory:  . No increased work of breathing  . No wheezes, rales, or rhonchi.  . No tactile fremitus Cardiovascular:  . Regular rate and rhythm. . No murmurs ectopy of gallups. . No lateral PMI. No thrills.   Abdomen:  . Abdomen is less distended this morning. Mild tenderness elicited with palpation. . No hernias, masses, or organomegaly are appreciated. Marland Kitchen Hypoactive bowel sounds. Musculoskeletal:  . No cyanosis, clubbing, or edema. Skin:  . No rashes, lesions, ulcers . palpation of skin: no induration or nodules .  Neurologic: Unable to evaluate due to the patient's  inability to participate with exam.  Psychiatric:  Unable to evaluate due to the patient's inability to participate with exam.  I have personally reviewed the following:   Today's Data  . BMP, CBC, Vitals, PT/INR  Imaging  . 1 view abdomen: Dilated loops of bowel, stomach,and transition zone compatible with a high grade obstruction. .   Scheduled Meds: . sodium chloride   Intravenous Once  . mouth rinse  15 mL Mouth Rinse BID  . sodium chloride flush  3 mL Intravenous Q12H   Continuous Infusions: . acetaminophen 400 mL/hr at 05/11/19 1745  . diltiazem (CARDIZEM) infusion 15 mg/hr (05/11/19 1410)  . lactated ringers 10 mL/hr at 05/11/19 1306  . lactated ringers 100 mL/hr at 05/11/19 1745  . methocarbamol (ROBAXIN) IV      Principal Problem:   Sepsis secondary to UTI [facility]) Active Problems:   Atrial fibrillation, chronic   Long term (current) use of anticoagulants   SBO (small bowel obstruction) ([facility])   Dementia without behavioral disturbance ([facility])   AKI (acute kidney injury) ([place])   Transient hypotension   LOS: 6 days    A & P   Sepsis 2/2 UTI : Patient presents tachycardic, WBC 17, and lactic acid elevated at 3.6.  Urinalysis concerning for possible source of infection.  Although symptoms could be stemming from SBO.  Patient had been empirically given antibiotics of vancomycin, cefepime, and metronidazole.  During last hospitalization in May patient was noted to be positive for E. coli  that was resistant to ciprofloxacin and ampicillin/sulbactam, but sensitive to Rocephin.  the patient was admitted to a medical telemetry bed. Blood and urine cultures have been obtained. He will be placed on empiric cefepime pending culture results. Lactic acid was trended.  Small bowel obstruction: Acute.  Patient complained of abdominal pain prior to arrival and had one episode of vomiting.  CT scan shows small bowel obstruction with transition trauma in the mid left hemiabdomen.  Risk  factors include previous history of small bowel obstruction related with adhesions. The patient remains NPO. General surgery has seen the patient. As the patient's INR remains 3, they are holding off on placing NGT. Coumadin has now been held and the patient is on a heparin drip pending possible surgical intervention. Continue IV fluids and NPO status. I have discussed the patient with surgery. NGT needs to be placed to decompress the bowel, but patient's INR is 2.8. The patient has received 6 units of FFP as well as IV vitamin K. Repeat INR is 1.9 this morning.I appreciate general surgery's assistance. General surgery will take the patient to surgery later today as SBO has not showed any sign of resolution.  Transient hypotension: Resolved.  Blood pressures currently appears stable. IV fluids as seen above bolus IV fluids as needed.  Acute kidney injury: Patient baseline creatinine noted to be 0.7.  He presents with creatinine elevated to 1.56 with BUN 17. Creatinine was 0.86 this morning.   Hypokalemia: Potassium 2.6 this morning. Supplement and Monitor.  Atrial fibrillation on anticoagulation: Chronic.  INR currently supertherapeutic at 3.1. CHA2DS2-VASc score = at least 3. Warfarin held pending possible necessary surgical intervention. Heparin drip per pharmacy once NGT is stable. Diltiazem drip has been started and continued for ongoing RVR.  Dementia with behavioral disturbance: Patient on medications of Aricept at home.   I have seen and examined this patient myself. I have spent 42 minutes in his evaluation and care. I have discussed the patient in detail with his wife who is at bedside. All questions answered to the best of my ability.  DVT prophylaxis: Heparin Code Status: Full Family Communication: Discussed plan of care with the patient's wife who is at the patient's bedside this morning. Disposition Plan: Possible discharge home in 2 to 3 days  Russell Heiden, DO [facility]  Direct contact: see [email or web address]  7PM-7AM contact night coverage as above 05/11/2019, 5:56 PM  LOS: 1 day

## 2019-05-11 NOTE — Anesthesia Preprocedure Evaluation (Addendum)
Anesthesia Evaluation  Patient identified by MRN, date of birth, ID band Patient awake    Reviewed: Allergy & Precautions, NPO status , Patient's Chart, lab work & pertinent test results  Airway Mallampati: I  TM Distance: >3 FB Neck ROM: Full    Dental no notable dental hx. (+) Teeth Intact, Dental Advisory Given   Pulmonary neg pulmonary ROS,    Pulmonary exam normal breath sounds clear to auscultation       Cardiovascular hypertension, Normal cardiovascular exam+ dysrhythmias (on diltiazem infusion ) Atrial Fibrillation + Valvular Problems/Murmurs AI  Rhythm:Regular Rate:Normal  TTE 2019 EF 60-65%, mild AI   Neuro/Psych PSYCHIATRIC DISORDERS Bipolar Disorder Dementia negative neurological ROS     GI/Hepatic Neg liver ROS, GERD  ,  Endo/Other  negative endocrine ROS  Renal/GU negative Renal ROS  negative genitourinary   Musculoskeletal negative musculoskeletal ROS (+)   Abdominal   Peds  Hematology  (+) Blood dyscrasia (Hgb 10.8; on coumadin), anemia ,   Anesthesia Other Findings SBO  Reproductive/Obstetrics                            Anesthesia Physical Anesthesia Plan  ASA: III  Anesthesia Plan: General   Post-op Pain Management:    Induction: Intravenous  PONV Risk Score and Plan: 2 and Treatment may vary due to age or medical condition and Ondansetron  Airway Management Planned: Oral ETT  Additional Equipment: Arterial line  Intra-op Plan:   Post-operative Plan: Extubation in OR and Possible Post-op intubation/ventilation  Informed Consent: I have reviewed the patients History and Physical, chart, labs and discussed the procedure including the risks, benefits and alternatives for the proposed anesthesia with the patient or authorized representative who has indicated his/her understanding and acceptance.     Dental advisory given  Plan Discussed with:  CRNA  Anesthesia Plan Comments:        Anesthesia Quick Evaluation

## 2019-05-11 NOTE — OR Nursing (Signed)
Patient intubated and then attempted to insert a foley. Patient's foreskin would not push back to allow penis head to come out of the foreskin. MD stretched foreskin to allow it to show penis head. Inserted foley with foreskin pushed back.

## 2019-05-11 NOTE — Progress Notes (Signed)
Nutrition Follow-up  RD working remotely.  DOCUMENTATION CODES:   Not applicable  INTERVENTION:   -RD will follow for diet advancement and supplement as appropriate -Pt has received inadequate nutrition throughout hospital stay. If prolonged NPO status is anticipated, consider initiation of nutrition support  NUTRITION DIAGNOSIS:   Inadequate oral intake related to altered GI function(SBO) as evidenced by NPO status.  Ongoing  GOAL:   Patient will meet greater than or equal to 90% of their needs  Unmet  MONITOR:   Diet advancement, Labs, Weight trends, Skin, I & O's  REASON FOR ASSESSMENT:   Low Braden    ASSESSMENT:   73 year old male with medical history significant of chronic a-fib, HTN, HLD, dementia admitted with SBO and cholelithiasis.  6/26- NGT placed, connected to low intermittent suction   Reviewed I/O's: +832 ml x 24 hours and +1.3 L since admission  UOP: 700 ml x 24 hours  NGT output: 3.1 L x 24 hours  Per general surgery notes, x-rays reveal no improvement. NGT remains with high output. Plan for surgery today; consent obtain from pt wife.   Pt has been NPO since hospital length of stay (6 days) and has not received adequate nutrition during this time.   Medications reviewed and include 0.9% NaCl with KCl 20 mEq/L infusion and cardizem.   Labs reviewed: Na: 147.   Diet Order:   Diet Order            Diet NPO time specified Except for: Ice Chips  Diet effective now              EDUCATION NEEDS:   No education needs have been identified at this time  Skin:  Skin Assessment: Skin Integrity Issues: Skin Integrity Issues:: Other (Comment) Other: MASD bilateral buttocks  Last BM:  Unknown  Height:   Ht Readings from Last 1 Encounters:  05/05/19 6' (1.829 m)    Weight:   Wt Readings from Last 1 Encounters:  05/11/19 80.1 kg    Ideal Body Weight:  80.91 kg  BMI:  Body mass index is 23.95 kg/m.  Estimated Nutritional Needs:    Kcal:  2200-2400  Protein:  105-120 grams  Fluid:  > 2.2 L    Inge Waldroup A. Jimmye Norman, RD, LDN, [place] Registered Dietitian II Certified Diabetes Care and Education Specialist Pager: [phone] After hours Pager: [phone]

## 2019-05-11 NOTE — Progress Notes (Signed)
Subjective/Chief Complaint: States no bowel function, not sure if understands all questions, wife will be here later   Objective: Vital signs in last 24 hours: Temp:  [97.3 F (36.3 C)-98.2 F (36.8 C)] 98.2 F (36.8 C) (06/30 0351) Pulse Rate:  [88-115] 88 (06/30 0351) Resp:  [12-15] 12 (06/30 0552) BP: (115-118)/(79-87) 115/82 (06/30 0552) SpO2:  [93 %-96 %] 96 % (06/30 0552) Weight:  [80.1 kg] 80.1 kg (06/30 0351) Last BM Date: (pta )  Intake/Output from previous day: 06/29 0701 - 06/30 0700 In: 4631.7 [I.V.:3731.7; IV Piggyback:900] Out: 3800 [Urine:700; Emesis/NG output:3100] Intake/Output this shift: No intake/output data recorded.  Gen:  Alert, NAD, pleasant, cooperative HEENT: NGT in place, 3100 output in last 24hrs Lungs clear cv irreg irreg Abd: mild distended, few bs, palpable small bowel loops, previously well healed midline scar   Lab Results:  Recent Labs    05/09/19 0609 05/10/19 0337  WBC 8.5 8.9  HGB 10.5* 10.8*  HCT 34.8* 35.2*  PLT 292 303   BMET Recent Labs    05/10/19 0337 05/11/19 0430  NA 147* 147*  K 3.0* 3.7  CL 107 111  CO2 26 24  GLUCOSE 92 92  BUN 18 23  CREATININE 0.86 0.90  CALCIUM 9.6 9.8   PT/INR Recent Labs    05/09/19 1444 05/10/19 0337  LABPROT 16.2* 15.9*  INR 1.3* 1.3*   ABG No results for input(s): PHART, HCO3 in the last 72 hours.  Invalid input(s): PCO2, PO2  Studies/Results: Dg Abd 1 View  Result Date: 05/11/2019 CLINICAL DATA:  Small-bowel obstruction EXAM: ABDOMEN - 1 VIEW COMPARISON:  05/10/2019 FINDINGS: Scattered large and small bowel gas is again identified. Persistent loops of small bowel are again identified and stable. No definitive free air is seen. Gastric catheter remains in the stomach. No definitive acute bony abnormality is noted. IMPRESSION: Mild persistent small bowel dilatation similar to that seen on the previous day. Electronically Signed   By: Inez Catalina M.D.   On: 05/11/2019  07:28   Dg Abd Portable 1v  Result Date: 05/10/2019 CLINICAL DATA:  Small bowel obstruction. EXAM: PORTABLE ABDOMEN - 1 VIEW COMPARISON:  Radiograph May 09, 2019. FINDINGS: Distal tip of nasogastric tube is seen in proximal stomach. Slightly decreased small bowel dilatation is noted suggesting improving obstruction or ileus. IMPRESSION: Slightly decreased small bowel dilatation is noted suggesting improving distal small bowel obstruction or ileus. Electronically Signed   By: Marijo Conception M.D.   On: 05/10/2019 09:51    Anti-infectives: Anti-infectives (From admission, onward)   Start     Dose/Rate Route Frequency Ordered Stop   05/06/19 2100  vancomycin (VANCOCIN) 1,250 mg in sodium chloride 0.9 % 250 mL IVPB  Status:  Discontinued     1,250 mg 166.7 mL/hr over 90 Minutes Intravenous Every 24 hours 05/05/19 1849 05/05/19 2057   05/06/19 0800  ceFEPIme (MAXIPIME) 2 g in sodium chloride 0.9 % 100 mL IVPB  Status:  Discontinued     2 g 200 mL/hr over 30 Minutes Intravenous Every 12 hours 05/05/19 1849 05/05/19 2057   05/06/19 0800  ceFEPIme (MAXIPIME) 2 g in sodium chloride 0.9 % 100 mL IVPB     2 g 200 mL/hr over 30 Minutes Intravenous Every 12 hours 05/05/19 2114     05/05/19 1830  ceFEPIme (MAXIPIME) 2 g in sodium chloride 0.9 % 100 mL IVPB     2 g 200 mL/hr over 30 Minutes Intravenous  Once 05/05/19 1817 05/05/19  2019   05/05/19 1830  metroNIDAZOLE (FLAGYL) IVPB 500 mg     500 mg 100 mL/hr over 60 Minutes Intravenous  Once 05/05/19 1817 05/05/19 2011   05/05/19 1830  vancomycin (VANCOCIN) IVPB 1000 mg/200 mL premix  Status:  Discontinued     1,000 mg 200 mL/hr over 60 Minutes Intravenous  Once 05/05/19 1817 05/05/19 1825   05/05/19 1830  vancomycin (VANCOCIN) 1,500 mg in sodium chloride 0.9 % 500 mL IVPB     1,500 mg 250 mL/hr over 120 Minutes Intravenous  Once 05/05/19 1825 05/05/19 2238      Assessment/Plan: SBO-Hx small bowel obstruction 03/01/19/small bowel resection  2004,Hx appendectomy 2004 - I dont think he has any bowel function and discussed this with wife. His films are fairly stable and his exam although not concerning is no better. He also has large volume of bilious drainage from ng tube. This has now been going on for six days although ng tube in only part of that.  I think at this point he needs surgery as it does not appear to be resolving at all. I think waiting might carry more morbidity although surgery is not without risk. I have discussed this with his wife and will proceed later today.  FEN -NPO, NGT VTE -hold heparin for surgery today ID -[email or web address] uti.  Rolm Bookbinder 05/11/2019

## 2019-05-11 NOTE — Progress Notes (Signed)
Patient transferred to 4[facility] post surgery. Wife at bedside asking about his eye glasses. Called [facility], glasses sent from [facility].  Patient's glasses sent from [facility]. No other belongings.

## 2019-05-11 NOTE — Anesthesia Procedure Notes (Signed)
Arterial Line Insertion Start/End6/30/2020 2:00 PM, 05/11/2019 2:10 PM Performed by: Verdie Drown, CRNA, CRNA  Left, radial was placed Catheter size: 20 G Hand hygiene performed  and maximum sterile barriers used   Attempts: 1 Procedure performed without using ultrasound guided technique.

## 2019-05-11 NOTE — Progress Notes (Signed)
PHARMACY - ADULT TOTAL PARENTERAL NUTRITION CONSULT NOTE   Pharmacy Consult for TPN Indication: Prolonged Ileus  Assessment:  58 YOM who presented on 6/24 with abdominal pain and found to have SBO secondary to adhesions - now s/p ex-lap with LOA 6/30. NGT to suction with large volume of bilious drainage noted.  Pharmacy consulted to start TPN due to prolonged ileus/NPO.   Given the time of day - TPN cannot be started until 7/1 at the earliest. Will order TPN labs for the AM for assessment.  Plan:  - TPN to start 7/1 as long as the patient has central access - Will order TPN labs for 7/1 AM  Thank you for allowing pharmacy to be a part of this patient's care.  Alycia Rossetti, PharmD, BCPS Clinical Pharmacist Clinical phone for 05/11/2019: [phone] 05/11/2019 4:53 PM   **Pharmacist phone directory can now be found on [email or web address] ([email or web address]).  Listed under [place].

## 2019-05-12 ENCOUNTER — Encounter (HOSPITAL_COMMUNITY): Payer: Self-pay | Admitting: General Surgery

## 2019-05-12 ENCOUNTER — Inpatient Hospital Stay: Payer: Self-pay

## 2019-05-12 ENCOUNTER — Inpatient Hospital Stay (HOSPITAL_COMMUNITY): Payer: Medicare Other

## 2019-05-12 LAB — DIFFERENTIAL
Abs Immature Granulocytes: 0.03 10*3/uL (ref 0.00–0.07)
Basophils Absolute: 0 10*3/uL (ref 0.0–0.1)
Basophils Relative: 0 %
Eosinophils Absolute: 0.2 10*3/uL (ref 0.0–0.5)
Eosinophils Relative: 2 %
Immature Granulocytes: 0 %
Lymphocytes Relative: 6 %
Lymphs Abs: 0.5 10*3/uL — ABNORMAL LOW (ref 0.7–4.0)
Monocytes Absolute: 0.2 10*3/uL (ref 0.1–1.0)
Monocytes Relative: 2 %
Neutro Abs: 8.3 10*3/uL — ABNORMAL HIGH (ref 1.7–7.7)
Neutrophils Relative %: 90 %

## 2019-05-12 LAB — COMPREHENSIVE METABOLIC PANEL
ALT: 17 U/L (ref 0–44)
AST: 17 U/L (ref 15–41)
Albumin: 3.1 g/dL — ABNORMAL LOW (ref 3.5–5.0)
Alkaline Phosphatase: 62 U/L (ref 38–126)
Anion gap: 14 (ref 5–15)
BUN: 18 mg/dL (ref 8–23)
CO2: 22 mmol/L (ref 22–32)
Calcium: 8.7 mg/dL — ABNORMAL LOW (ref 8.9–10.3)
Chloride: 109 mmol/L (ref 98–111)
Creatinine, Ser: 0.98 mg/dL (ref 0.61–1.24)
GFR calc Af Amer: >60 mL/min (ref >60)
GFR calc non Af Amer: >60 mL/min (ref >60)
Glucose, Bld: 154 mg/dL — ABNORMAL HIGH (ref 70–99)
Potassium: 3.7 mmol/L (ref 3.5–5.1)
Sodium: 145 mmol/L (ref 135–145)
Total Bilirubin: 1.6 mg/dL — ABNORMAL HIGH (ref 0.3–1.2)
Total Protein: 6.6 g/dL (ref 6.5–8.1)

## 2019-05-12 LAB — PHOSPHORUS: Phosphorus: 2.5 mg/dL (ref 2.5–4.6)

## 2019-05-12 LAB — CBC
HCT: 30.2 % — ABNORMAL LOW (ref 39.0–52.0)
Hemoglobin: 9.3 g/dL — ABNORMAL LOW (ref 13.0–17.0)
MCH: 25.6 pg — ABNORMAL LOW (ref 26.0–34.0)
MCHC: 30.8 g/dL (ref 30.0–36.0)
MCV: 83.2 fL (ref 80.0–100.0)
Platelets: 258 10*3/uL (ref 150–400)
RBC: 3.63 MIL/uL — ABNORMAL LOW (ref 4.22–5.81)
RDW: 19.2 % — ABNORMAL HIGH (ref 11.5–15.5)
WBC: 9.3 10*3/uL (ref 4.0–10.5)
nRBC: 0 % (ref 0.0–0.2)

## 2019-05-12 LAB — PREALBUMIN: Prealbumin: 15.1 mg/dL — ABNORMAL LOW (ref 18–38)

## 2019-05-12 LAB — TRIGLYCERIDES: Triglycerides: 51 mg/dL (ref <150)

## 2019-05-12 LAB — MAGNESIUM: Magnesium: 1.6 mg/dL — ABNORMAL LOW (ref 1.7–2.4)

## 2019-05-12 LAB — HEPARIN LEVEL (UNFRACTIONATED)
Heparin Unfractionated: <0.1 IU/mL — ABNORMAL LOW (ref 0.30–0.70)
Heparin Unfractionated: 0.35 IU/mL (ref 0.30–0.70)

## 2019-05-12 MED ORDER — HEPARIN (PORCINE) 25000 UT/250ML-% IV SOLN
1450.0000 [IU]/h | INTRAVENOUS | Status: DC
  Administered 2019-05-12 – 2019-05-14 (×4): 1350 [IU]/h via INTRAVENOUS
  Administered 2019-05-15 – 2019-05-18 (×4): 1450 [IU]/h via INTRAVENOUS
  Filled 2019-05-12 (×9): qty 250

## 2019-05-12 MED ORDER — MAGNESIUM SULFATE 2 GM/50ML IV SOLN
2.0000 g | Freq: Once | INTRAVENOUS | Status: AC
  Administered 2019-05-12: 2 g via INTRAVENOUS
  Filled 2019-05-12: qty 50

## 2019-05-12 MED ORDER — CHLORHEXIDINE GLUCONATE CLOTH 2 % EX PADS
6.0000 | MEDICATED_PAD | Freq: Every day | CUTANEOUS | Status: DC
  Administered 2019-05-13 – 2019-05-20 (×8): 6 via TOPICAL

## 2019-05-12 MED ORDER — TRAVASOL 10 % IV SOLN
INTRAVENOUS | Status: AC
  Administered 2019-05-12: 19:00:00 via INTRAVENOUS
  Filled 2019-05-12: qty 432

## 2019-05-12 MED ORDER — KCL IN DEXTROSE-NACL 20-5-0.45 MEQ/L-%-% IV SOLN
INTRAVENOUS | Status: DC
  Administered 2019-05-12: 1 mL via INTRAVENOUS
  Administered 2019-05-12 – 2019-05-14 (×2): via INTRAVENOUS

## 2019-05-12 MED ORDER — SODIUM CHLORIDE 0.9% FLUSH
10.0000 mL | Freq: Two times a day (BID) | INTRAVENOUS | Status: DC
  Administered 2019-05-12: 10 mL
  Administered 2019-05-12: 20 mL
  Administered 2019-05-13 – 2019-05-19 (×11): 10 mL

## 2019-05-12 MED ORDER — CHLORHEXIDINE GLUCONATE CLOTH 2 % EX PADS
6.0000 | MEDICATED_PAD | Freq: Every day | CUTANEOUS | Status: DC
  Administered 2019-05-12 – 2019-05-19 (×7): 6 via TOPICAL

## 2019-05-12 MED ORDER — KCL IN DEXTROSE-NACL 20-5-0.45 MEQ/L-%-% IV SOLN
INTRAVENOUS | Status: AC
  Administered 2019-05-12: 10:00:00 via INTRAVENOUS
  Filled 2019-05-12: qty 2000

## 2019-05-12 MED ORDER — SODIUM CHLORIDE 0.9% FLUSH
10.0000 mL | INTRAVENOUS | Status: DC | PRN
  Administered 2019-05-14 – 2019-05-20 (×2): 10 mL
  Filled 2019-05-12 (×2): qty 40

## 2019-05-12 NOTE — Progress Notes (Signed)
Peripherally Inserted Central Catheter/Midline Placement  The IV Nurse has discussed with the patient and/or persons authorized to consent for the patient, the purpose of this procedure and the potential benefits and risks involved with this procedure.  The benefits include less needle sticks, lab draws from the catheter, and the patient may be discharged home with the catheter. Risks include, but not limited to, infection, bleeding, blood clot (thrombus formation), and puncture of an artery; nerve damage and irregular heartbeat and possibility to perform a PICC exchange if needed/ordered by physician.  Alternatives to this procedure were also discussed.  B[employer] Power PICC patient education guide, fact sheet on infection prevention and patient information card has been provided to patient /or left at bedside.    PICC/Midline Placement Documentation  PICC Double Lumen 83/15/17 PICC Right Basilic 46 cm 0 cm (Active)  Indication for Insertion or Continuance of Line Administration of hyperosmolar/irritating solutions (i.e. TPN, Vancomycin, etc.) 05/12/19 1000  Exposed Catheter (cm) 0 cm 05/12/19 1000  Site Assessment Clean;Dry;Intact 05/12/19 1000  Lumen #1 Status Flushed;Saline locked;Blood return noted 05/12/19 1000  Lumen #2 Status Flushed;Saline locked;Blood return noted 05/12/19 1000  Dressing Type Transparent;Securing device 05/12/19 1000  Dressing Status Clean;Dry;Intact;Antimicrobial disc in place 05/12/19 [place] checked and tightened 05/12/19 1000  Dressing Intervention New dressing 05/12/19 1000  Dressing Change Due 05/19/19 05/12/19 1000       Russell Dillon 05/12/2019, 10:47 AM

## 2019-05-12 NOTE — Progress Notes (Signed)
1 Day Post-Op   Subjective/Chief Complaint: Baseline mental status   Objective: Vital signs in last 24 hours: Temp:  [97 F (36.1 C)-98.5 F (36.9 C)] 98.5 F (36.9 C) (07/01 0800) Pulse Rate:  [85-107] 92 (07/01 0700) Resp:  [11-19] 16 (07/01 0700) BP: (96-125)/(64-80) 109/65 (07/01 0700) SpO2:  [91 %-98 %] 97 % (07/01 0700) Arterial Line BP: (122-158)/(60-72) 131/65 (07/01 0700) Weight:  [80.1 kg-81.1 kg] 81.1 kg (07/01 0500) Last BM Date: 05/06/19  Intake/Output from previous day: 06/30 0701 - 07/01 0700 In: 4070.4 [I.V.:3300.8; IV Piggyback:769.7] Out: 3725 [Urine:3575; Blood:50] Intake/Output this shift: No intake/output data recorded.  Gen: Alert, NAD, pleasant, cooperative, not oriented Lungs clear cv irreg irreg Abd: mild distended, no bs, wound clean   Lab Results:  Recent Labs    05/10/19 0337 05/11/19 1516 05/12/19 0615  WBC 8.9  --  9.3  HGB 10.8* 11.2* 9.3*  HCT 35.2* 33.0* 30.2*  PLT 303  --  258   BMET Recent Labs    05/11/19 0430 05/11/19 1516 05/12/19 0615  NA 147* 148* 145  K 3.7 3.5 3.7  CL 111  --  109  CO2 24  --  22  GLUCOSE 92  --  154*  BUN 23  --  18  CREATININE 0.90  --  0.98  CALCIUM 9.8  --  8.7*   PT/INR Recent Labs    05/09/19 1444 05/10/19 0337  LABPROT 16.2* 15.9*  INR 1.3* 1.3*   ABG Recent Labs    05/11/19 1516  PHART 7.397  HCO3 24.4    Studies/Results: Dg Abd 1 View  Result Date: 05/11/2019 CLINICAL DATA:  Small-bowel obstruction EXAM: ABDOMEN - 1 VIEW COMPARISON:  05/10/2019 FINDINGS: Scattered large and small bowel gas is again identified. Persistent loops of small bowel are again identified and stable. No definitive free air is seen. Gastric catheter remains in the stomach. No definitive acute bony abnormality is noted. IMPRESSION: Mild persistent small bowel dilatation similar to that seen on the previous day. Electronically Signed   By: Inez Catalina M.D.   On: 05/11/2019 07:28   Dg Abd Portable  1v  Result Date: 05/10/2019 CLINICAL DATA:  Small bowel obstruction. EXAM: PORTABLE ABDOMEN - 1 VIEW COMPARISON:  Radiograph May 09, 2019. FINDINGS: Distal tip of nasogastric tube is seen in proximal stomach. Slightly decreased small bowel dilatation is noted suggesting improving obstruction or ileus. IMPRESSION: Slightly decreased small bowel dilatation is noted suggesting improving distal small bowel obstruction or ileus. Electronically Signed   By: Marijo Conception M.D.   On: 05/10/2019 09:51   [place] Ekg Site Rite  Result Date: 05/11/2019 If Site Rite image not attached, placement could not be confirmed due to current cardiac rhythm.   Anti-infectives: Anti-infectives (From admission, onward)   Start     Dose/Rate Route Frequency Ordered Stop   05/06/19 2100  vancomycin (VANCOCIN) 1,250 mg in sodium chloride 0.9 % 250 mL IVPB  Status:  Discontinued     1,250 mg 166.7 mL/hr over 90 Minutes Intravenous Every 24 hours 05/05/19 1849 05/05/19 2057   05/06/19 0800  ceFEPIme (MAXIPIME) 2 g in sodium chloride 0.9 % 100 mL IVPB  Status:  Discontinued     2 g 200 mL/hr over 30 Minutes Intravenous Every 12 hours 05/05/19 1849 05/05/19 2057   05/06/19 0800  ceFEPIme (MAXIPIME) 2 g in sodium chloride 0.9 % 100 mL IVPB  Status:  Discontinued     2 g 200 mL/hr over 30  Minutes Intravenous Every 12 hours 05/05/19 2114 05/11/19 1257   05/05/19 1830  ceFEPIme (MAXIPIME) 2 g in sodium chloride 0.9 % 100 mL IVPB     2 g 200 mL/hr over 30 Minutes Intravenous  Once 05/05/19 1817 05/05/19 2019   05/05/19 1830  metroNIDAZOLE (FLAGYL) IVPB 500 mg     500 mg 100 mL/hr over 60 Minutes Intravenous  Once 05/05/19 1817 05/05/19 2011   05/05/19 1830  vancomycin (VANCOCIN) IVPB 1000 mg/200 mL premix  Status:  Discontinued     1,000 mg 200 mL/hr over 60 Minutes Intravenous  Once 05/05/19 1817 05/05/19 1825   05/05/19 1830  vancomycin (VANCOCIN) 1,500 mg in sodium chloride 0.9 % 500 mL IVPB     1,500 mg 250 mL/hr over  120 Minutes Intravenous  Once 05/05/19 1825 05/05/19 2238      Assessment/Plan: POD 1 elap with loa- Deziray Nabi SBO-Hx small bowel obstruction 03/01/19/small bowel resection 2004,Hx appendectomy 2004 - has been without ntn for some time preop, expect prolonged ileus, start tpn and place picc today -dressing changes bid -oob and pulm toilet CV: on dilt gtt, dispo per medicine, likely not able to tolerate po and absorb for some time, can restart iv heparin without bolus today from surgery standpoint FEN -NPO, NGT, start TPN today VTE -can restart heparin gtt per medicine, if not would start proph lovenox ID -no need for abx for surgical procedure Foley- was difficult to place, would not remove until up more   Rolm Bookbinder 05/12/2019

## 2019-05-12 NOTE — Progress Notes (Signed)
ANTICOAGULATION CONSULT NOTE - Initial Consult  Pharmacy Consult for heparin Indication: atrial fibrillation  No Known Allergies  Patient Measurements: Height: 6' 0.01" (182.9 cm) Weight: 178 lb 12.7 oz (81.1 kg) IBW/kg (Calculated) : 77.62 Heparin Dosing Weight: 81 kg  Vital Signs: Temp: 98.5 F (36.9 C) (07/01 0800) Temp Source: Oral (07/01 0800) BP: 105/64 (07/01 1000) Pulse Rate: 91 (07/01 0900)  Labs: Recent Labs    05/09/19 1444  05/10/19 0337 05/11/19 0430 05/11/19 1516 05/12/19 0615  HGB  --    < > 10.8*  --  11.2* 9.3*  HCT  --   --  35.2*  --  33.0* 30.2*  PLT  --   --  303  --   --  258  LABPROT 16.2*  --  15.9*  --   --   --   INR 1.3*  --  1.3*  --   --   --   HEPARINUNFRC  --    < > 0.38 0.40  --  <0.10*  CREATININE  --   --  0.86 0.90  --  0.98   < > = values in this interval not displayed.     Medical History: Past Medical History:  Diagnosis Date  . Aortic valve regurgitation 10/16/2018   Echo 07/28/2017:  EF 60-65, moderate AI, aortic root and ascending aorta mildly dilated (40 mm), ascending aorta 43 mm, MAC, mild MR, mild LAE, moderate RV enlargement, trivial TR // Echo 12/19: EF 60-65, normal wall motion, mild AI, moderate BAE, ascending aorta 43 mm, aortic root 39 mm  . BPH (benign prostatic hypertrophy)   . Chronic anticoagulation   . Chronic atrial fibrillation   . Diastolic dysfunction October 2010   Normal LV systolic function  . Hx SBO   . Hyperlipidemia   . Hypertension   . Patellar tendon rupture, left, initial encounter 02/10/2019  . Small bowel obstruction [facility])      Assessment: 73 yo male admitted with abdominal pain. Had a small bowel resection yesterday. He is on warfarin prior to admission for AFib. He is currently in AFib. Warfarin has been held perioperatively. Heparin will be restarted today. H/h and plts stable s/p surgery.   Goal of Therapy:  Heparin level 0.3-0.7 units/ml Monitor platelets by anticoagulation  protocol: Yes    Plan:  -Resume heparin at 1350 units/hr, without a bolus given procedure yesterday -Daily HL, CBC -Check level tonight   Harvel Quale 05/12/2019,11:13 AM

## 2019-05-12 NOTE — Progress Notes (Signed)
Nutrition Follow-up  DOCUMENTATION CODES:   Not applicable  INTERVENTION:   -TPN management per pharmacy -RD will follow for diet advancement and supplement diet as appropriate  NUTRITION DIAGNOSIS:   Inadequate oral intake related to altered GI function(SBO) as evidenced by NPO status.  Ongoing  GOAL:   Patient will meet greater than or equal to 90% of their needs  Progressing  MONITOR:   Diet advancement, Labs, Weight trends, Skin, I & O's  REASON FOR ASSESSMENT:   Low Braden    ASSESSMENT:   73 year old male with medical history significant of chronic a-fib, HTN, HLD, dementia admitted with SBO and cholelithiasis.  6/26- NGT placed to low intermittent suction  6/30-  S/p ex lap with lysis of adhesions 7/1- PICC placed, TPN to be initiated  Reviewed I/O's: +345 ml x 24 hours and +1.7 L since admission  UOP: 3.6 L x 24 hours  Pt with inadequate nutrition during hospital length of stay (7 days). Per general surgery notes, plan to initiate TPN today due to suspected prolonged ileus. Per pharmacy notes, plan to initiate TPN @ 40 ml/hr, which provides 1094 kcals and 27 grams of protein, meeting 50% of estimated kcal needs and 26% of estimated protein needs.   Medications reviewed and include dextrose 5% and 0.45% NaCl with KCl 20 mEq/L infusion @ 100 ml/hr, heparin, and cardizem.   Labs reviewed.   Diet Order:   Diet Order            Diet NPO time specified Except for: Ice Chips  Diet effective now              EDUCATION NEEDS:   No education needs have been identified at this time  Skin:  Skin Assessment: Skin Integrity Issues: Skin Integrity Issues:: Other (Comment), Incisions Incisions: closed abdomen Other: MASD to bilateral buttocks  Last BM:  05/06/19  Height:   Ht Readings from Last 1 Encounters:  05/11/19 6' 0.01" (1.829 m)    Weight:   Wt Readings from Last 1 Encounters:  05/12/19 81.1 kg    Ideal Body Weight:  80.91 kg  BMI:   Body mass index is 24.24 kg/m.  Estimated Nutritional Needs:   Kcal:  2200-2400  Protein:  105-120 grams  Fluid:  > 2.2 L    Jayme Cham A. Jimmye Norman, RD, LDN, [place] Registered Dietitian II Certified Diabetes Care and Education Specialist Pager: [phone] After hours Pager: [phone]

## 2019-05-12 NOTE — Progress Notes (Addendum)
NAME:  FLYNN GWYN, MRN:  [id number], DOB:  1946-08-23, LOS: 7 ADMISSION DATE:  05/05/2019, CONSULTATION DATE:  05/11/2019 REFERRING MD:  Donne Hazel, CHIEF COMPLAINT:  Hypotension   Brief History   73 yo male admitted for UTI and small bowel obstruction, now s/p laparotomy for SBO with lysis of abdominal adhesions.  History of present illness   Patient is a 73 year old man with history of dementia and chronic A fib who presented to the ED on 6/24 complaining of several days of abdominal pain, and was admitted with a UTI and small bowel obstruction with an apparent transition point. Patient was kept NPO to relieve obstruction, which was unsuccessful. NG tube was placed on 6/26, which produced large volume bilious output. Patient has been septic with elevated WBC and lactic acid during this admission requiring fluids and vasopressors. Was taken to surgery on 6/30, where a laparotomy w/ lysis of large adhesion was performed without complications. Patient admitted to ICU for monitoring overnight because of hypotension earlier this admission and need for vasopressors during surgery and immediately post operatively.   Past Medical History   Past Medical History:  Diagnosis Date  . Aortic valve regurgitation 10/16/2018   Echo 07/28/2017:  EF 60-65, moderate AI, aortic root and ascending aorta mildly dilated (40 mm), ascending aorta 43 mm, MAC, mild MR, mild LAE, moderate RV enlargement, trivial TR // Echo 12/19: EF 60-65, normal wall motion, mild AI, moderate BAE, ascending aorta 43 mm, aortic root 39 mm  . BPH (benign prostatic hypertrophy)   . Chronic anticoagulation   . Chronic atrial fibrillation   . Diastolic dysfunction October 2010   Normal LV systolic function  . Hx SBO   . Hyperlipidemia   . Hypertension   . Patellar tendon rupture, left, initial encounter 02/10/2019  . Small bowel obstruction ([place])     Significant Hospital Events   6/30: Laparotomy w/ lysis of adhesions   Consults:  none  Procedures:  none  Significant Diagnostic Tests:  none  Micro Data:  Urine Cx: + for Corynebacterium Blood Cx: negative  Antimicrobials:  S/p empiric IV Cefepime  Interim history/subjective:  6/30: Patient admitted to ICU. Perfusion is clinically equivocal. Patient confused, although mentation is appropriate considering baseline dementia. Wife at bedside.  Objective   Blood pressure 109/66, pulse 100, temperature 98.5 F (36.9 C), temperature source Oral, resp. rate 13, height 6' 0.01" (1.829 m), weight 81.1 kg, SpO2 98 %.        Intake/Output Summary (Last 24 hours) at 05/12/2019 0913 Last data filed at 05/12/2019 0900 Gross per 24 hour  Intake 4285.27 ml  Output 3875 ml  Net 410.27 ml   Filed Weights   05/11/19 0351 05/11/19 1249 05/12/19 0500  Weight: 80.1 kg 80.1 kg 81.1 kg    Examination: General: Older gentleman who is confused but in no acute distress HEENT: NCAT, EOMI, PERRLA. Moist membranes Lungs: CTA, no wheezes, rhonchi, rales Cardiovascular: RRR, no m/g/r Abdomen: soft, NT,ND, BS-, bandage clean and dry Extremities: No edema, cap refil 2-3 seconds in digits Skin: no rashes, cool and dry Neuro: moves all 4 extremities, follows commands, alert, not oriented to place or time.  GU: foley in place.   Resolved Hospital Problem list     Assessment & Plan:  Hypotension: Clinically patient appears well perfused. Has remained off pressors since arrival to ICU. Has maintained BP > 100, and HR <110.  -IV Lactated Ringers -transfer to the floor   Sepsis 2/2 UTI/SBO:  Patient afebrile and normotensive at this point. Blood cultures were negative. Urine cx grew corynebacterium. S/p empiric IV cefepime pending cultures -no further antimicrobials indicated  Laparotomy for Small Bowel Obstruction:  -bandage is clean and dry, abdomen flat, with no bowel sounds -NPO for bowel sounds  AKI: -Cr stable -resolved  Chronic A fib on anticoagulation:  -maintaining HR < 110 -Diltiazem for RVR -Restarting heparin per pharmacy  Dementia w/ behavioral disturbance: -patient is confused but non combative here -on Aricept at home, restart today  Best practice:  Diet: NPO Pain/Anxiety/Delirium protocol (if indicated):  VAP protocol (if indicated):  DVT prophylaxis: SCDs GI prophylaxis: none Glucose control: none Mobility: bed rest Code Status: full code Family Communication: wife at bedside Disposition: ICU  Labs   CBC: Recent Labs  Lab 05/06/19 0200 05/07/19 0248 05/09/19 0609 05/10/19 0337 05/11/19 1516 05/12/19 0615  WBC 14.6* 13.9* 8.5 8.9  --  9.3  NEUTROABS  --  12.7*  --   --   --  8.3*  HGB 11.3* 11.4* 10.5* 10.8* 11.2* 9.3*  HCT 36.2* 36.6* 34.8* 35.2* 33.0* 30.2*  MCV 82.3 82.6 82.9 83.0  --  83.2  PLT 371 343 292 303  --  604    Basic Metabolic Panel: Recent Labs  Lab 05/05/19 1707  05/07/19 0248 05/09/19 0609 05/10/19 0337 05/11/19 0430 05/11/19 1516 05/12/19 0615  NA 139   < > 141 148* 147* 147* 148* 145  K 4.8   < > 3.8 2.6* 3.0* 3.7 3.5 3.7  CL 106   < > 110 106 107 111  --  109  CO2 24   < > 21* _0 --  22  GLUCOSE 140*   < > 135* 103* 92 92  --  154*  BUN 17   < > _1 --  18  CREATININE 1.56*   < > 0.97 0.92 0.86 0.90  --  0.98  CALCIUM 10.0   < > 9.6 9.8 9.6 9.8  --  8.7*  MG 1.8  --   --   --   --   --   --  1.6*  PHOS  --   --   --   --   --   --   --  2.5   < > = values in this interval not displayed.   GFR: Estimated Creatinine Clearance: 72.6 mL/min (by C-G formula based on SCr of 0.98 mg/dL). Recent Labs  Lab 05/05/19 1712 05/05/19 1804 05/05/19 2241 05/06/19 0200 05/07/19 0248 05/09/19 0609 05/10/19 0337 05/12/19 0615  WBC  --   --   --  14.6* 13.9* 8.5 8.9 9.3  LATICACIDVEN 3.6* 2.0* 3.0* 1.4  --   --   --   --     Liver Function Tests: Recent Labs  Lab 05/05/19 1707 05/12/19 0615  AST 17 17  ALT 14 17  ALKPHOS 109 62  BILITOT 0.3 1.6*  PROT 8.6*  6.6  ALBUMIN 3.4* 3.1*   Recent Labs  Lab 05/05/19 1707  LIPASE 24   No results for input(s): AMMONIA in the last 168 hours.  ABG    Component Value Date/Time   PHART 7.397 05/11/2019 1516   PCO2ART 39.0 05/11/2019 1516   PO2ART 144.0 (H) 05/11/2019 1516   HCO3 24.4 05/11/2019 1516   TCO2 26 05/11/2019 1516   ACIDBASEDEF 1.0 05/11/2019 1516   O2SAT 99.0 05/11/2019 1516     Coagulation Profile: Recent Labs  Lab 05/08/19 0939 05/08/19 1624 05/09/19 0609 05/09/19 1444 05/10/19 0337  INR 2.1* 2.0* 1.9* 1.3* 1.3*    Cardiac Enzymes: No results for input(s): CKTOTAL, CKMB, CKMBINDEX, TROPONINI in the last 168 hours.  HbA1C: No results found for: HGBA1C  CBG: Recent Labs  Lab 05/05/19 1711  GLUCAP 129*    Review of Systems:   ROS not collected due to patient confusion  Past Medical History  He,  has a past medical history of Aortic valve regurgitation (10/16/2018), BPH (benign prostatic hypertrophy), Chronic anticoagulation, Chronic atrial fibrillation, Diastolic dysfunction (October 2010), SBO, Hyperlipidemia, Hypertension, Patellar tendon rupture, left, initial encounter (02/10/2019), and Small bowel obstruction ([place]).   Surgical History    Past Surgical History:  Procedure Laterality Date  . APPENDECTOMY  2004  . APPLICATION OF WOUND VAC Left 02/11/2019   Procedure: Application Of Wound Vac;  Surgeon: Altamese Leavenworth, MD;  Location: [place];  Service: Orthopedics;  Laterality: Left;  . CARDIOVASCULAR STRESS TEST  09/30/2007   EF 67%  No ischemia.   Marland Kitchen CARDIOVERSION  05/01/2004  . KNEE ARTHROSCOPY Left 02/11/2019   Procedure: ARTHROSCOPY LEFT KNEE;  Surgeon: Altamese Romeo, MD;  Location: [place];  Service: Orthopedics;  Laterality: Left;  . LAPAROTOMY N/A 05/11/2019   Procedure: EXPLORATORY LAPAROTOMY;  Surgeon: Rolm Bookbinder, MD;  Location: [place];  Service: General;  Laterality: N/A;  . LYSIS OF ADHESION N/A 05/11/2019   Procedure: LYSIS OF ADHESION;  Surgeon:  Rolm Bookbinder, MD;  Location: [place];  Service: General;  Laterality: N/A;  . PATELLAR TENDON REPAIR Left 02/11/2019   Procedure: LEFT PATELLA TENDON REPAIR;  Surgeon: Altamese Boy River, MD;  Location: [place];  Service: Orthopedics;  Laterality: Left;  . SMALL INTESTINE SURGERY  2004  . US ECHOCARDIOGRAPHY  09/05/2009   ef 55-60%     Social History   reports that he has never smoked. He has never used smokeless tobacco. He reports that he does not drink alcohol or use drugs.   Family History   His family history includes Diabetes in his father; Heart attack in his mother; Heart disease in his father; Hypertension in his mother. There is no history of Colon cancer, Esophageal cancer, Pancreatic cancer, Prostate cancer, Rectal cancer, or Stomach cancer.   Allergies No Known Allergies   Home Medications  Prior to Admission medications   Medication Sig Start Date End Date Taking? Authorizing Provider  acetaminophen (TYLENOL) 325 MG tablet Take 2 tablets (650 mg total) by mouth every 6 (six) hours as needed for mild pain, fever or headache. 03/02/19  Yes Angiulli, Lavon Paganini, PA-C  Cholecalciferol (VITAMIN D-3) 25 MCG (1000 UT) CAPS Take 1,000 Units by mouth 2 (two) times a day.   Yes [provider]  colchicine 0.6 MG tablet Take 1 tablet (0.6 mg total) by mouth 2 (two) times daily. 03/25/19  Yes Dhungel, Nishant, MD  Cyanocobalamin (B-12 COMPLIANCE INJECTION) 1000 MCG/ML KIT Inject 1,000 mcg as directed every 30 (thirty) days.   Yes [provider]  diltiazem (CARDIZEM CD) 120 MG 24 hr capsule Take 1 capsule (120 mg total) by mouth daily. 03/25/19 03/24/20 Yes Black, Lezlie Octave, NP  docusate sodium (COLACE) 100 MG capsule Take 1 capsule (100 mg total) by mouth 2 (two) times daily. Patient taking differently: Take 100 mg by mouth every other day.  03/02/19  Yes Angiulli, Lavon Paganini, PA-C  donepezil (ARICEPT) 10 MG tablet Take 1 tablet (10 mg total) by mouth daily. Patient taking  differently: Take  10 mg by mouth at bedtime.  03/02/19  Yes Angiulli, Lavon Paganini, PA-C  feeding supplement, ENSURE ENLIVE, (ENSURE ENLIVE) LIQD Take 237 mLs by mouth 2 (two) times daily between meals. Patient taking differently: Take 237 mLs by mouth 2 (two) times daily as needed (for supplementation). CHOCOLATE 03/22/19  Yes Regalado, Belkys A, MD  finasteride (PROSCAR) 5 MG tablet Take 1 tablet (5 mg total) by mouth every morning. 03/02/19  Yes Angiulli, Lavon Paganini, PA-C  lithium carbonate 150 MG capsule TAKE 1 CAPSULE(150 MG) BY MOUTH AT BEDTIME Patient taking differently: Take 150 mg by mouth at bedtime.  04/19/19  Yes Cottle, Billey Co., MD  Multiple Vitamins-Minerals (ICAPS AREDS 2 PO) Take 1 capsule by mouth daily.    Yes [provider]  Omega-3 Fatty Acids (OMEGA-3 FISH OIL PO) Take 1 capsule by mouth daily with breakfast.    Yes [provider]  polyethylene glycol powder (GLYCOLAX/MIRALAX) 17 GM/SCOOP powder Take 17 g by mouth daily as needed for mild constipation.   Yes [provider]  warfarin (COUMADIN) 2.5 MG tablet Take 1 tablet (2.5 mg total) by mouth daily. Patient taking differently: Take 2.5 mg by mouth daily at 6 PM.  03/02/19  Yes Angiulli, Lavon Paganini, PA-C  allopurinol (ZYLOPRIM) 100 MG tablet Take 1 tablet (100 mg total) by mouth daily. Patient not taking: Reported on 05/05/2019 03/26/19   Radene Gunning, NP  HYDROcodone-acetaminophen (NORCO/VICODIN) 5-325 MG tablet Take 1-2 tablets by mouth every 4 (four) hours as needed for moderate pain (pain score 4-6). Patient not taking: Reported on 05/05/2019 03/02/19   Prosperity, Lavon Paganini, PA-C     Critical care time: The patient is critically ill with multiple organ systems failure and requires high complexity decision making for assessment and support, frequent evaluation and titration of therapies, application of advanced monitoring technologies and extensive interpretation of multiple databases.  Critical care time 35  mins. This represents my time independent of the NPs time taking care of the pt. This is excluding procedures.    Thalia Bloodgood, MS 05/12/2019, 9:13 AM

## 2019-05-12 NOTE — Progress Notes (Deleted)
BP for 1330 above parameters of SBP<140 w/ cleviprex at max dose.  Dr. Erlinda Hong notified.  Awaiting orders to be put in.  Will continue to monitor.

## 2019-05-12 NOTE — Progress Notes (Addendum)
ANTICOAGULATION CONSULT NOTE - Follow Up  Pharmacy Consult for Heparin Indication: atrial fibrillation  No Known Allergies  Patient Measurements: Height: 6' 0.01" (182.9 cm) Weight: 178 lb 12.7 oz (81.1 kg) IBW/kg (Calculated) : 77.62 Heparin Dosing Weight: 81 kg  Vital Signs: Temp: 98.3 F (36.8 C) (07/01 2000) Temp Source: Oral (07/01 2000) BP: 122/77 (07/01 2000) Pulse Rate: 92 (07/01 2000)  Labs: Recent Labs    05/10/19 0337 05/11/19 0430 05/11/19 1516 05/12/19 0615 05/12/19 1956  HGB 10.8*  --  11.2* 9.3*  --   HCT 35.2*  --  33.0* 30.2*  --   PLT 303  --   --  258  --   LABPROT 15.9*  --   --   --   --   INR 1.3*  --   --   --   --   HEPARINUNFRC 0.38 0.40  --  <0.10* 0.35  CREATININE 0.86 0.90  --  0.98  --      Medical History: Past Medical History:  Diagnosis Date  . Aortic valve regurgitation 10/16/2018   Echo 07/28/2017:  EF 60-65, moderate AI, aortic root and ascending aorta mildly dilated (40 mm), ascending aorta 43 mm, MAC, mild MR, mild LAE, moderate RV enlargement, trivial TR // Echo 12/19: EF 60-65, normal wall motion, mild AI, moderate BAE, ascending aorta 43 mm, aortic root 39 mm  . BPH (benign prostatic hypertrophy)   . Chronic anticoagulation   . Chronic atrial fibrillation   . Diastolic dysfunction October 2010   Normal LV systolic function  . Hx SBO   . Hyperlipidemia   . Hypertension   . Patellar tendon rupture, left, initial encounter 02/10/2019  . Small bowel obstruction [facility])      Assessment: 73 yo male admitted with abdominal pain; S/P small bowel resection yesterday. He was on warfarin prior to admission for Afib. Warfarin was  held perioperatively. Heparin was restarted today. Hgb 9.3 today; platelets WNL after surgery.   Heparin level drawn at 19:56 this evening (~8 hrs after restarting heparin infusion at 1350 units/hr) was 0.35 units/ml, which is within the goal range for heparin. Per RN, no IV issues or signs/symptoms of  bleeding noted.   Goal of Therapy:  Heparin level 0.3-0.7 units/ml Monitor platelets by anticoagulation protocol: Yes    Plan:  -Continue heparin at 1350 units/hr and check heparin level with AM labs on 7/2 -Daily heparin level, CBC   Gillermina Hu, PharmD, BCPS, [facility] Clinical Pharmacist 05/12/2019,8:44 PM

## 2019-05-12 NOTE — Progress Notes (Signed)
PHARMACY - ADULT TOTAL PARENTERAL NUTRITION CONSULT NOTE   Pharmacy Consult for TPN Indication: prolonged ileus  Patient Measurements: Height: 6' 0.01" (182.9 cm) Weight: 178 lb 12.7 oz (81.1 kg) IBW/kg (Calculated) : 77.62 TPN AdjBW (KG): 80.1 Body mass index is 24.24 kg/m.   Assessment:  Admitted with abdominal pain. Patient has history of prior small bowel resection and appendectomy in 2004. CT on admission shows small bowel obstruction. He was first started on conservative management. Placement of NG tube was initially held due therapeutic INR of 3. On 6/26 warfarin was reversed and NG was placed for decompression. After a few days of no improvement, patient went to OR for small bowel resection with resultant lysis of adhesions.  GI: s/p small bowel resection 05/11/19. Pre-albumin 15.1. Has been NPO for the past 8 days. Endo: cbgs < 160 Insulin requirements in the past 24 hours: 0 units Lytes: Na, K, Phos, Coca (10.2) wnl, Mg 1.6 - Mg replacement ordered Renal: SCr 0.9, on d5-1/2NS + 20KCl @ 100 and LR at [place]: RA Cards: Currently in AFib, on warfarin PTA. Starting heparin today. Also on dilt gtt. Hepatobil: LFTs wnl Tbili 1.6. TG 51 Neuro: dementia at baseline, on Aricept ID: Completed empiric course of Cefepime  TPN Access: PICC placed 7/1 TPN start date: 05/12/19 Nutritional Goals (per RD recommendation on):  Awaiting RD recs kCal: Protein:  Fluid:  Goal TPN rate:   Current Nutrition:   Plan:  -Initiate TPN at 40 mL/hr -This TPN provides 43 g of protein, 192 g of dextrose, and 27 g of lipids which provides 1094 kCals per day, meeting ~50% of patient needs -Electrolytes in TPN: Increase Mg, others to standard, WC:BJSEGBT 1:1 -Add MVI, trace elements Mon/Wed/Fri to TPN -Reduce d5-1/2NS + 20KCl to 50 ml/hr when TPN begins   Harvel Quale 05/12/2019 8:11 AM

## 2019-05-13 DIAGNOSIS — N390 Urinary tract infection, site not specified: Secondary | ICD-10-CM

## 2019-05-13 DIAGNOSIS — A419 Sepsis, unspecified organism: Secondary | ICD-10-CM

## 2019-05-13 LAB — CBC
HCT: 32.6 % — ABNORMAL LOW (ref 39.0–52.0)
Hemoglobin: 10 g/dL — ABNORMAL LOW (ref 13.0–17.0)
MCH: 25.6 pg — ABNORMAL LOW (ref 26.0–34.0)
MCHC: 30.7 g/dL (ref 30.0–36.0)
MCV: 83.6 fL (ref 80.0–100.0)
Platelets: 260 10*3/uL (ref 150–400)
RBC: 3.9 MIL/uL — ABNORMAL LOW (ref 4.22–5.81)
RDW: 19.3 % — ABNORMAL HIGH (ref 11.5–15.5)
WBC: 19.1 10*3/uL — ABNORMAL HIGH (ref 4.0–10.5)
nRBC: 0 % (ref 0.0–0.2)

## 2019-05-13 LAB — COMPREHENSIVE METABOLIC PANEL
ALT: 23 U/L (ref 0–44)
AST: 24 U/L (ref 15–41)
Albumin: 2.9 g/dL — ABNORMAL LOW (ref 3.5–5.0)
Alkaline Phosphatase: 60 U/L (ref 38–126)
Anion gap: 11 (ref 5–15)
BUN: 17 mg/dL (ref 8–23)
CO2: 25 mmol/L (ref 22–32)
Calcium: 8.7 mg/dL — ABNORMAL LOW (ref 8.9–10.3)
Chloride: 107 mmol/L (ref 98–111)
Creatinine, Ser: 0.84 mg/dL (ref 0.61–1.24)
GFR calc Af Amer: >60 mL/min (ref >60)
GFR calc non Af Amer: >60 mL/min (ref >60)
Glucose, Bld: 184 mg/dL — ABNORMAL HIGH (ref 70–99)
Potassium: 3.5 mmol/L (ref 3.5–5.1)
Sodium: 143 mmol/L (ref 135–145)
Total Bilirubin: 1 mg/dL (ref 0.3–1.2)
Total Protein: 6.5 g/dL (ref 6.5–8.1)

## 2019-05-13 LAB — PROCALCITONIN: Procalcitonin: 0.22 ng/mL

## 2019-05-13 LAB — PHOSPHORUS: Phosphorus: 1.8 mg/dL — ABNORMAL LOW (ref 2.5–4.6)

## 2019-05-13 LAB — HEPARIN LEVEL (UNFRACTIONATED): Heparin Unfractionated: 0.38 IU/mL (ref 0.30–0.70)

## 2019-05-13 LAB — MAGNESIUM: Magnesium: 2 mg/dL (ref 1.7–2.4)

## 2019-05-13 MED ORDER — DILTIAZEM HCL 25 MG/5ML IV SOLN
25.0000 mg | Freq: Four times a day (QID) | INTRAVENOUS | Status: DC
  Administered 2019-05-13 – 2019-05-17 (×17): 25 mg via INTRAVENOUS
  Filled 2019-05-13 (×24): qty 5

## 2019-05-13 MED ORDER — METOPROLOL TARTRATE 5 MG/5ML IV SOLN: 2.5000 mg | INTRAVENOUS | Status: DC | PRN

## 2019-05-13 MED ORDER — TRAVASOL 10 % IV SOLN
INTRAVENOUS | Status: AC
  Administered 2019-05-13: 18:00:00 via INTRAVENOUS
  Filled 2019-05-13: qty 858

## 2019-05-13 MED ORDER — ACETAMINOPHEN 10 MG/ML IV SOLN
1000.0000 mg | Freq: Four times a day (QID) | INTRAVENOUS | Status: AC
  Administered 2019-05-13 – 2019-05-14 (×4): 1000 mg via INTRAVENOUS
  Filled 2019-05-13 (×4): qty 100

## 2019-05-13 NOTE — Progress Notes (Signed)
Nutrition Follow-up  RD working remotely.  DOCUMENTATION CODES:   Not applicable  INTERVENTION:   -TPN management per pharmacy -RD will follow for diet advancement and supplement diet as appropriate  NUTRITION DIAGNOSIS:   Inadequate oral intake related to altered GI function(SBO) as evidenced by NPO status.  Ongoing  GOAL:   Patient will meet greater than or equal to 90% of their needs  Progressing  MONITOR:   Diet advancement, Labs, Weight trends, Skin, I & O's  REASON FOR ASSESSMENT:   Low Braden    ASSESSMENT:   73 year old male with medical history significant of chronic a-fib, HTN, HLD, dementia admitted with SBO and cholelithiasis.  6/26- NGT placed to low intermittent suction  6/30-  S/p ex lap with lysis of adhesions 7/1- PICC placed, TPN to be initiated 7/2- transferred from ICU to PCU  Reviewed I/O's: -297 ml x 24 hours and +1.4 L since admission  UOP: 2.3 L x 24 hours  NGT output: 75 ml x 24 hours  Pt receiving TPN @ 40 ml/hr, which provides 1094 kcals and 27 grams of protein, meeting 50% of estimated kcal needs and 26% of estimated protein needs. Per pharmacy notes, plan to increase TPN to 60 ml/hr at 1800, which provides 1887 kcals and 85 grams protein daily, which meets 86% of estimated kcal needs and 81% of estimated protein needs.   Medications reviewed and include dextrose 5% and 0.45% NaCl with KCl mEq/L infusion @ 10 ml/hr and heparin drip.  Labs reviewed: K and Mg WDL. Phos: 1.8 (per pharmacy note, to increase in TPN this evening).    Diet Order:   Diet Order            Diet NPO time specified Except for: Ice Chips  Diet effective now              EDUCATION NEEDS:   No education needs have been identified at this time  Skin:  Skin Assessment: Skin Integrity Issues: Skin Integrity Issues:: Other (Comment), Incisions Incisions: closed abdomen Other: MASD to bilateral buttocks  Last BM:  05/06/19  Height:   Ht Readings  from Last 1 Encounters:  05/11/19 6' 0.01" (1.829 m)    Weight:   Wt Readings from Last 1 Encounters:  05/13/19 79.1 kg    Ideal Body Weight:  80.91 kg  BMI:  Body mass index is 23.65 kg/m.  Estimated Nutritional Needs:   Kcal:  2200-2400  Protein:  105-120 grams  Fluid:  > 2.2 L    Eulalia Ellerman A. Jimmye Norman, RD, LDN, [place] Registered Dietitian II Certified Diabetes Care and Education Specialist Pager: [phone] After hours Pager: [phone]

## 2019-05-13 NOTE — Evaluation (Signed)
Physical Therapy Evaluation Patient Details Name: Russell Dillon MRN: [id number] DOB: March 02, 1946 Today's Date: 05/13/2019   History of Present Illness  Pt is a 73 y/o male admitted secondary to worsening abdominal pain. Pt found to have SBO and is s/p exploratory laparotomy. Pt also with sepsis secondary to UTI. Pt with ICU stay following hypotension. PMH includes a fib, dementia, HTN, and L patellar tendon repair.   Clinical Impression  Pt admitted secondary to problem above with deficits below. Pt requiring max A to perform bed mobility tasks. Sat EOB for ~10 mins with min guard to supervision assist. Pt becoming agitated at times and required redirection. Pt's wife in room and very helpful with redirection. Per pt's wife, plan is to return home, but she is interested in acquiring more assistance for home. Will continue to follow acutely to maximize functional mobility independence and safety.     Follow Up Recommendations Home health PT;Supervision/Assistance - 24 hour(HHOT, HHaide, HHRN-pt's wife refusing SNF )    Equipment Recommendations  None recommended by PT    Recommendations for Other Services       Precautions / Restrictions Precautions Precautions: Fall;Other (comment) Precaution Comments: NG tube Restrictions Weight Bearing Restrictions: No      Mobility  Bed Mobility Overal bed mobility: Needs Assistance Bed Mobility: Supine to Sit;Sit to Supine     Supine to sit: Max assist Sit to supine: Max assist   General bed mobility comments: Pt requiring max A for LE assist and trunk assist during bed mobility. Pt sitting EOB for ~10 mins and refusing further mobility. Pt's wife very helpful and encouraging to pt throughout session. Pt somewhat restless and grabbing at lines. Pt's NG tube becoming slightly dislodged during session and RN in room to address at end of session.   Transfers                    Ambulation/Gait                Stairs             Wheelchair Mobility    Modified Rankin (Stroke Patients Only)       Balance Overall balance assessment: Needs assistance Sitting-balance support: No upper extremity supported;Feet supported Sitting balance-Leahy Scale: Fair                                       Pertinent Vitals/Pain Pain Assessment: Faces Faces Pain Scale: Hurts little more Pain Location: LLE Pain Descriptors / Indicators: Grimacing;Guarding Pain Intervention(s): Monitored during session;Limited activity within patient's tolerance;Repositioned    Home Living Family/patient expects to be discharged to:: Private residence Living Arrangements: Spouse/significant other Available Help at Discharge: Family;Personal care attendant;Available 24 hours/day Type of Home: House Home Access: Ramped entrance     Home Layout: Two level;Bed/bath upstairs Home Equipment: Bedside commode;Hospital bed;Wheelchair - Education administrator (comment);Walker - 2 wheels(slide board) Additional Comments: Per wife, pt  with caregiver that assists with bathing and changing pt in the morning, afternoon, and evening for a couple hours.     Prior Function Level of Independence: Needs assistance   Gait / Transfers Assistance Needed: Needs assist with transfers using slide board or hoyer lift depending on the day. Wife reports she has trouble using hoyer secondary to shoulder injury.   ADL's / Homemaking Assistance Needed: Caregiver assists with ADLs.         Hand  Dominance        Extremity/Trunk Assessment   Upper Extremity Assessment Upper Extremity Assessment: Defer to OT evaluation    Lower Extremity Assessment Lower Extremity Assessment: Generalized weakness;LLE deficits/detail LLE Deficits / Details: Hx of L patellar tendon repair and pt very tender to touch right below knee    Cervical / Trunk Assessment Cervical / Trunk Assessment: Kyphotic  Communication   Communication: No difficulties  Cognition  Arousal/Alertness: Awake/alert Behavior During Therapy: Flat affect;Agitated Overall Cognitive Status: History of cognitive impairments - at baseline                                 General Comments: Pt with dementia at baseline, however, wife reports agitation seems to be worse since admission. Pt agitated at times throughout session and required redirection. Wife very helpful for redirection.       General Comments      Exercises     Assessment/Plan    PT Assessment Patient needs continued PT services  PT Problem List Decreased strength;Decreased activity tolerance;Decreased balance;Decreased mobility;Decreased cognition;Decreased knowledge of use of DME;Decreased safety awareness;Decreased knowledge of precautions       PT Treatment Interventions Functional mobility training;Therapeutic activities;Therapeutic exercise;Balance training;Patient/family education;Wheelchair mobility training    PT Goals (Current goals can be found in the Care Plan section)  Acute Rehab PT Goals Patient Stated Goal: per wife, to return home at d/c  PT Goal Formulation: With family Time For Goal Achievement: 05/27/19 Potential to Achieve Goals: Fair    Frequency Min 3X/week   Barriers to discharge Other (comment) Wife unable to assist physically secondary to shoulder injury. Interested in getting more help at home.     Co-evaluation               AM-PAC PT "6 Clicks" Mobility  Outcome Measure Help needed turning from your back to your side while in a flat bed without using bedrails?: Total Help needed moving from lying on your back to sitting on the side of a flat bed without using bedrails?: Total Help needed moving to and from a bed to a chair (including a wheelchair)?: Total Help needed standing up from a chair using your arms (e.g., wheelchair or bedside chair)?: Total Help needed to walk in hospital room?: Total Help needed climbing 3-5 steps with a railing? :  Total 6 Click Score: 6    End of Session   Activity Tolerance: Patient limited by fatigue;Treatment limited secondary to agitation Patient left: in bed;with call bell/phone within reach;with bed alarm set;with nursing/sitter in room Nurse Communication: Mobility status;Other (comment)(pt's NG tube dislodged) PT Visit Diagnosis: Muscle weakness (generalized) (M62.81);Difficulty in walking, not elsewhere classified (R26.2);Unsteadiness on feet (R26.81)    Time: 1451-1531 PT Time Calculation (min) (ACUTE ONLY): 40 min   Charges:   PT Evaluation $PT Eval Moderate Complexity: 1 Mod PT Treatments $Therapeutic Activity: 23-37 mins        Leighton Ruff, PT, DPT  Acute Rehabilitation Services  Pager: [phone] Office: [phone]   Rudean Hitt 05/13/2019, 4:09 PM

## 2019-05-13 NOTE — Progress Notes (Addendum)
NAME:  RAHM MINIX, MRN:  [id number], DOB:  10-31-1946, LOS: 8 ADMISSION DATE:  05/05/2019, CONSULTATION DATE:  05/11/2019 REFERRING MD:  Donne Hazel, CHIEF COMPLAINT:  Hypotension   Brief History   73 yo male admitted for UTI and small bowel obstruction, now s/p laparotomy for SBO with lysis of abdominal adhesions.  History of present illness   Patient is a 73 year old man with history of dementia and chronic A fib who presented to the ED on 6/24 complaining of several days of abdominal pain, and was admitted with a UTI and small bowel obstruction with an apparent transition point. Patient was kept NPO to relieve obstruction, which was unsuccessful. NG tube was placed on 6/26, which produced large volume bilious output. Patient has been septic with elevated WBC and lactic acid during this admission requiring fluids and vasopressors. Was taken to surgery on 6/30, where a laparotomy w/ lysis of large adhesion was performed without complications. Patient admitted to ICU for monitoring overnight because of hypotension earlier this admission and need for vasopressors during surgery and immediately post operatively.   Past Medical History   Past Medical History:  Diagnosis Date   Aortic valve regurgitation 10/16/2018   Echo 07/28/2017:  EF 60-65, moderate AI, aortic root and ascending aorta mildly dilated (40 mm), ascending aorta 43 mm, MAC, mild MR, mild LAE, moderate RV enlargement, trivial TR // Echo 12/19: EF 60-65, normal wall motion, mild AI, moderate BAE, ascending aorta 43 mm, aortic root 39 mm   BPH (benign prostatic hypertrophy)    Chronic anticoagulation    Chronic atrial fibrillation    Diastolic dysfunction October 2010   Normal LV systolic function   Hx SBO    Hyperlipidemia    Hypertension    Patellar tendon rupture, left, initial encounter 02/10/2019   Small bowel obstruction ([place])     Significant Hospital Events   6/30: Laparotomy w/ lysis of adhesions  Consults:  none  Procedures:  none  Significant Diagnostic Tests:  none  Micro Data:  Urine Cx: + for Corynebacterium Blood Cx: negative  Antimicrobials:  Cefepime 6/24- 6/30  Interim history/subjective:  7/2: Patient did well overnight. BP and HR stable on gtt diltiazem. Restarted on heparin yesterday.  Objective   Blood pressure 98/69, pulse 70, temperature 97.6 F (36.4 C), temperature source Axillary, resp. rate 11, height 6' 0.01" (1.829 m), weight 79.1 kg, SpO2 98 %.        Intake/Output Summary (Last 24 hours) at 05/13/2019 0821 Last data filed at 05/13/2019 0747 Gross per 24 hour  Intake 2005.3 ml  Output 2450 ml  Net -444.7 ml   Filed Weights   05/11/19 1249 05/12/19 0500 05/13/19 0500  Weight: 80.1 kg 81.1 kg 79.1 kg    Examination: General: Older gentleman who is confused but in no acute distress HEENT: NCAT, EOMI, PERRLA. Moist membranes Lungs: CTA, no wheezes, rhonchi, rales Cardiovascular: RRR, no m/g/r Abdomen: soft, NT,ND, faint bowel sounds, bandage clean and dry Extremities: No edema, cap refil 2-3 seconds in digits Skin: no rashes, cool and dry Neuro: moves all 4 extremities, follows commands, alert, not oriented to place or time.  GU: foley in place.   Resolved Hospital Problem list     Assessment & Plan:  Mr. Ginsberg has remained stable since arriving to the ICU on 6/30 and weaning off pressors. He is rate controlled on 5 mL/hr diltiazem, which has been stable for the last 24 hrs. He was cleared by surgery for anticoagulation  and was restarted on heparin yesterday per pharmacy.  He remains on TPN until bowel function returns. His CBC showed a WBC of 19 this morning, blood cultures and pro calc ordered, plan for empiric antibiotics pending results. He is ready to be transferred to the floor.   Hypotension: Clinically patient appears well perfused. Has remained off pressors since arrival to ICU. Has maintained BP > 100, and HR <110.  -IVF: D5 1/2NS w/ KCl   Sepsis 2/2 UTI/SBO: Patient afebrile and normotensive at this point. Blood cultures were negative. Urine cx grew corynebacterium.  -S/p empiric IV cefepime pending cultures - WBC 19 this am, up from 9 yesterday - Blood culture and procalcitonin ordered - CBC w/ diff tmrw am - may need to restart empiric antibiotics if WBC remains high  Laparotomy for Small Bowel Obstruction:  -bandage is clean and dry, abdomen flat, with no bowel sounds -NPO for bowel sounds  AKI: -Cr stable -resolved  Chronic A fib on anticoagulation: -maintaining HR < 110 -Diltiazem gtt for RVR, stable at 5 mL/hr -IV Cardizem 25 mg q6h, and metoprolol PRN ordered -heparin restarted, therapeutic. Plt 260 this am  Dementia w/ behavioral disturbance: -patient is confused but not agitated -on Aricept at home holding until bowel function returns  Best practice:  Diet: NPO Pain/Anxiety/Delirium protocol (if indicated):  VAP protocol (if indicated):  DVT prophylaxis: Heparin GI prophylaxis: none Glucose control: none Mobility: bed rest Code Status: full code Family Communication: wife at bedside Disposition: ICU   Thalia Bloodgood, [place] 05/13/2019, 8:21 AM

## 2019-05-13 NOTE — Progress Notes (Signed)
[facility] Progress Note  2 Days Post-Op  Subjective: CC-  Patient comfortable this morning. States that he slept a lot last night. Denies any abdominal pain. Denies n/v. Thinks he passed some flatus earlier this morning. No BM.   Objective: Vital signs in last 24 hours: Temp:  [97.6 F (36.4 C)-98.3 F (36.8 C)] 97.6 F (36.4 C) (07/02 0800) Pulse Rate:  [70-104] 70 (07/02 0700) Resp:  [11-21] 11 (07/02 0700) BP: (98-128)/(64-90) 98/69 (07/02 0700) SpO2:  [94 %-100 %] 98 % (07/02 0700) Arterial Line BP: (113-137)/(64-71) 137/69 (07/01 1300) Weight:  [79.1 kg] 79.1 kg (07/02 0500) Last BM Date: 05/06/19  Intake/Output from previous day: 07/01 0701 - 07/02 0700 In: 2027.7 [I.V.:1877.1; IV Piggyback:150.6] Out: 2325 [Urine:2325] Intake/Output this shift: Total I/O In: 84.9 [I.V.:84.9] Out: 275 [Urine:275]  PE: Gen:  Alert, NAD, pleasant HEENT: EOM's intact, pupils equal and round Card:  irregular Pulm:  CTAB, no W/R/R, effort normal Abd: Soft, ND, NT, hypoactive BS, open midline incision pink without erythema or drainage/ some bloody drainage noted on packing but I do not see any active bleeding from the wound Skin: warm and dry  Lab Results:  Recent Labs    05/12/19 0615 05/13/19 0706  WBC 9.3 19.1*  HGB 9.3* 10.0*  HCT 30.2* 32.6*  PLT 258 260   BMET Recent Labs    05/12/19 0615 05/13/19 0706  NA 145 143  K 3.7 3.5  CL 109 107  CO2 22 25  GLUCOSE 154* 184*  BUN 18 17  CREATININE 0.98 0.84  CALCIUM 8.7* 8.7*   PT/INR No results for input(s): LABPROT, INR in the last 72 hours. CMP     Component Value Date/Time   NA 143 05/13/2019 0706   NA 135 07/16/2017 1348   K 3.5 05/13/2019 0706   CL 107 05/13/2019 0706   CO2 25 05/13/2019 0706   GLUCOSE 184 (H) 05/13/2019 0706   BUN 17 05/13/2019 0706   BUN 13 07/16/2017 1348   CREATININE 0.84 05/13/2019 0706   CALCIUM 8.7 (L) 05/13/2019 0706   CALCIUM 8.2 (L) 02/10/2019 0302   PROT 6.5  05/13/2019 0706   ALBUMIN 2.9 (L) 05/13/2019 0706   AST 24 05/13/2019 0706   ALT 23 05/13/2019 0706   ALKPHOS 60 05/13/2019 0706   BILITOT 1.0 05/13/2019 0706   GFRNONAA >60 05/13/2019 0706   GFRAA >60 05/13/2019 0706   Lipase     Component Value Date/Time   LIPASE 24 05/05/2019 1707       Studies/Results: Dg Chest Port 1 View  Result Date: 05/12/2019 CLINICAL DATA:  PICC placement. EXAM: PORTABLE CHEST 1 VIEW COMPARISON:  05/05/2019 FINDINGS: The patient is rotated to the left. An enteric tube has been placed and courses into the upper abdomen with tip not imaged. A new right PICC terminates near the superior cavoatrial junction. The cardiomediastinal silhouette is unchanged. Aortic atherosclerosis is noted. There is persistent slight elevation of the left hemidiaphragm with minimal basilar atelectasis. No confluent airspace opacity, overt pulmonary edema, pleural effusion, pneumothorax is identified. No acute osseous abnormality is seen. IMPRESSION: 1. Right PICC terminates near the superior cavoatrial junction. 2. Minimal left basilar atelectasis. Electronically Signed   By: Logan Bores M.D.   On: 05/12/2019 11:42   [place] Ekg Site Rite  Result Date: 05/12/2019 If Site Rite image not attached, placement could not be confirmed due to current cardiac rhythm.  [place] Ekg Site Rite  Result Date: 05/11/2019 If [employer]  not attached, placement could not be confirmed due to current cardiac rhythm.   Anti-infectives: Anti-infectives (From admission, onward)   Start     Dose/Rate Route Frequency Ordered Stop   05/06/19 2100  vancomycin (VANCOCIN) 1,250 mg in sodium chloride 0.9 % 250 mL IVPB  Status:  Discontinued     1,250 mg 166.7 mL/hr over 90 Minutes Intravenous Every 24 hours 05/05/19 1849 05/05/19 2057   05/06/19 0800  ceFEPIme (MAXIPIME) 2 g in sodium chloride 0.9 % 100 mL IVPB  Status:  Discontinued     2 g 200 mL/hr over 30 Minutes Intravenous Every 12 hours 05/05/19 1849  05/05/19 2057   05/06/19 0800  ceFEPIme (MAXIPIME) 2 g in sodium chloride 0.9 % 100 mL IVPB  Status:  Discontinued     2 g 200 mL/hr over 30 Minutes Intravenous Every 12 hours 05/05/19 2114 05/11/19 1257   05/05/19 1830  ceFEPIme (MAXIPIME) 2 g in sodium chloride 0.9 % 100 mL IVPB     2 g 200 mL/hr over 30 Minutes Intravenous  Once 05/05/19 1817 05/05/19 2019   05/05/19 1830  metroNIDAZOLE (FLAGYL) IVPB 500 mg     500 mg 100 mL/hr over 60 Minutes Intravenous  Once 05/05/19 1817 05/05/19 2011   05/05/19 1830  vancomycin (VANCOCIN) IVPB 1000 mg/200 mL premix  Status:  Discontinued     1,000 mg 200 mL/hr over 60 Minutes Intravenous  Once 05/05/19 1817 05/05/19 1825   05/05/19 1830  vancomycin (VANCOCIN) 1,500 mg in sodium chloride 0.9 % 500 mL IVPB     1,500 mg 250 mL/hr over 120 Minutes Intravenous  Once 05/05/19 1825 05/05/19 2238       Assessment/Plan Atrial fibrillation - coumadin at home. Currently on IV diltiazem and IV heparin HTN HLD BPH Dementia Recent left patella tendon repair 02/11/2019 Malnutrition - prealbumin 15.1 (7/1), on TPN  POD 2 elap with loa 6/30- [place] SBO-Hx small bowel obstruction 03/01/19/small bowel resection 2004,Hx appendectomy 2004 - BID wet to dry dressing changes - mobilize, OOB, PT/OT - continue NPO/NGT and await return in bowel function. Continue TPN - I called and updated the patient's wife.  FEN -NPO, NGT, TPN  VTE - heparin gtt ID - WBC up 19.1, afebrile. CCM obtaining blood cultures and procalcitonin, observing on abx for now Foley- difficult to place in OR, would not remove until up more Contact - wife Dhiren Azimi [phone] or [phone]   LOS: 8 days    Wellington Hampshire , [facility] 05/13/2019, 9:48 AM Pager: [phone] Mon-Thurs 7:00 am-4:30 pm Fri 7:00 am -11:30 AM Sat-Sun 7:00 am-11:30 am

## 2019-05-13 NOTE — Progress Notes (Signed)
1430: Patient arrived to room 5W31. Wife at bedside. Heparin drip at 13.73ml;/hr, TPN at 69ml/hr, D5 with 1/2 saline and 20K at 76ml/hr. Patient refusing to be turned and refusing skin assessment.

## 2019-05-13 NOTE — Progress Notes (Signed)
PHARMACY - ADULT TOTAL PARENTERAL NUTRITION CONSULT NOTE   Pharmacy Consult for TPN Indication: prolonged ileus  Patient Measurements: Height: 6' 0.01" (182.9 cm) Weight: 174 lb 6.1 oz (79.1 kg) IBW/kg (Calculated) : 77.62 TPN AdjBW (KG): 80.1 Body mass index is 23.65 kg/m.   Assessment:  Admitted with abdominal pain. Patient has history of prior small bowel resection and appendectomy in 2004. CT on admission shows small bowel obstruction. He was first started on conservative management. Placement of NG tube was initially held due therapeutic INR of 3. On 6/26 warfarin was reversed and NG was placed for decompression. After a few days of no improvement, patient went to OR for small bowel resection with resultant lysis of adhesions.  GI: s/p small bowel resection 05/11/19. Pre-albumin 15.1. Has been NPO for the past 8 days. Endo: cbgs 90-180 Insulin requirements in the past 24 hours: 0 units Lytes: Phos 1.8, K 3.5, Coca (10.2) wnl, Mg 2 Renal: SCr 0.8, on d5-1/2NS + 20KCl @ 50 and LR at [place]: RA Cards: Currently in AFib, on warfarin PTA. Starting heparin today. Also on dilt gtt. Hepatobil: LFTs wnl Tbili 1.6. TG 51 Neuro: dementia at baseline, on Aricept ID: Completed empiric course of Cefepime  TPN Access: PICC placed 7/1 TPN start date: 05/12/19 Nutritional Goals (per RD recommendation on 05/12/19):   KCal: 2200-2400 Protein: 105-120 g Fluid: > 2.2 L  Goal TPN rate:   Current Nutrition:   Plan:  -Increase TPN to 60 mL/hr -This TPN provides 85 g of protein, 312 g of dextrose, and 48 g of lipids which provides 1887 kCals per day, meeting ~80% of patient needs -Electrolytes in TPN: Increase Phos, K, others to standard, MH:[id number] 1:1 -Add MVI, trace elements Mon/Wed/Fri to TPN -Reduce d5-1/2NS + 20KCl to kvo when TPN begins   Harvel Quale 05/13/2019 8:06 AM

## 2019-05-13 NOTE — Progress Notes (Signed)
  I arrived on 40 N to see Russell Dillon    I was informed by Dr Marshell Garfinkel who was still on the unit that pt was already seem by him (PCCM) on 05/13/2019  Dr Vaughan Browner (PCCM) plans to transfer pt to [facility] (PCCM pick up ) for 05/14/2019  Roxan Hockey, MD

## 2019-05-13 NOTE — Progress Notes (Signed)
ANTICOAGULATION CONSULT NOTE - Initial Consult  Pharmacy Consult for heparin Indication: atrial fibrillation  Patient Measurements: Height: 6' 0.01" (182.9 cm) Weight: 174 lb 6.1 oz (79.1 kg) IBW/kg (Calculated) : 77.62 Heparin Dosing Weight: 81 kg  Vital Signs: Temp: 97.6 F (36.4 C) (07/02 0800) Temp Source: Axillary (07/02 0800) BP: 98/69 (07/02 0700) Pulse Rate: 70 (07/02 0700)  Labs: Recent Labs    05/11/19 0430  05/11/19 1516 05/12/19 0615 05/12/19 1956 05/13/19 0706  HGB  --    < > 11.2* 9.3*  --  10.0*  HCT  --   --  33.0* 30.2*  --  32.6*  PLT  --   --   --  258  --  260  HEPARINUNFRC 0.40  --   --  <0.10* 0.35 0.38  CREATININE 0.90  --   --  0.98  --  0.84   < > = values in this interval not displayed.     Medical History: Past Medical History:  Diagnosis Date  . Aortic valve regurgitation 10/16/2018   Echo 07/28/2017:  EF 60-65, moderate AI, aortic root and ascending aorta mildly dilated (40 mm), ascending aorta 43 mm, MAC, mild MR, mild LAE, moderate RV enlargement, trivial TR // Echo 12/19: EF 60-65, normal wall motion, mild AI, moderate BAE, ascending aorta 43 mm, aortic root 39 mm  . BPH (benign prostatic hypertrophy)   . Chronic anticoagulation   . Chronic atrial fibrillation   . Diastolic dysfunction October 2010   Normal LV systolic function  . Hx SBO   . Hyperlipidemia   . Hypertension   . Patellar tendon rupture, left, initial encounter 02/10/2019  . Small bowel obstruction [facility])      Assessment: 73 yo male admitted with abdominal pain. Had a small bowel resection yesterday. He is on warfarin prior to admission for AFib. He is currently in AFib. Warfarin has been held perioperatively. Heparin was restarted yesterday. H/h and plts stable s/p surgery.   Goal of Therapy:  Heparin level 0.3-0.7 units/ml Monitor platelets by anticoagulation protocol: Yes    Plan:  -Continue heparin at 1350 units/hr -Daily HL, CBC   Seth Friedlander, Jake Church 05/13/2019,9:14 AM

## 2019-05-14 LAB — CBC WITH DIFFERENTIAL/PLATELET
Abs Immature Granulocytes: 0.06 10*3/uL (ref 0.00–0.07)
Basophils Absolute: 0 10*3/uL (ref 0.0–0.1)
Basophils Relative: 0 %
Eosinophils Absolute: 0.1 10*3/uL (ref 0.0–0.5)
Eosinophils Relative: 1 %
HCT: 32.3 % — ABNORMAL LOW (ref 39.0–52.0)
Hemoglobin: 10.1 g/dL — ABNORMAL LOW (ref 13.0–17.0)
Immature Granulocytes: 1 %
Lymphocytes Relative: 10 %
Lymphs Abs: 1.1 10*3/uL (ref 0.7–4.0)
MCH: 25.8 pg — ABNORMAL LOW (ref 26.0–34.0)
MCHC: 31.3 g/dL (ref 30.0–36.0)
MCV: 82.6 fL (ref 80.0–100.0)
Monocytes Absolute: 0.6 10*3/uL (ref 0.1–1.0)
Monocytes Relative: 5 %
Neutro Abs: 9.5 10*3/uL — ABNORMAL HIGH (ref 1.7–7.7)
Neutrophils Relative %: 83 %
Platelets: 257 10*3/uL (ref 150–400)
RBC: 3.91 MIL/uL — ABNORMAL LOW (ref 4.22–5.81)
RDW: 19.5 % — ABNORMAL HIGH (ref 11.5–15.5)
WBC: 11.3 10*3/uL — ABNORMAL HIGH (ref 4.0–10.5)
nRBC: 0 % (ref 0.0–0.2)

## 2019-05-14 LAB — HEMOGLOBIN A1C
Hgb A1c MFr Bld: 5.6 % (ref 4.8–5.6)
Mean Plasma Glucose: 114.02 mg/dL

## 2019-05-14 LAB — BASIC METABOLIC PANEL
Anion gap: 9 (ref 5–15)
BUN: 18 mg/dL (ref 8–23)
CO2: 25 mmol/L (ref 22–32)
Calcium: 8.7 mg/dL — ABNORMAL LOW (ref 8.9–10.3)
Chloride: 108 mmol/L (ref 98–111)
Creatinine, Ser: 0.7 mg/dL (ref 0.61–1.24)
GFR calc Af Amer: >60 mL/min (ref >60)
GFR calc non Af Amer: >60 mL/min (ref >60)
Glucose, Bld: 158 mg/dL — ABNORMAL HIGH (ref 70–99)
Potassium: 3.4 mmol/L — ABNORMAL LOW (ref 3.5–5.1)
Sodium: 142 mmol/L (ref 135–145)

## 2019-05-14 LAB — MAGNESIUM: Magnesium: 1.8 mg/dL (ref 1.7–2.4)

## 2019-05-14 LAB — PHOSPHORUS: Phosphorus: 2.4 mg/dL — ABNORMAL LOW (ref 2.5–4.6)

## 2019-05-14 LAB — PROCALCITONIN: Procalcitonin: 0.16 ng/mL

## 2019-05-14 LAB — GLUCOSE, CAPILLARY
Glucose-Capillary: 124 mg/dL — ABNORMAL HIGH (ref 70–99)
Glucose-Capillary: 121 mg/dL — ABNORMAL HIGH (ref 70–99)

## 2019-05-14 LAB — HEPARIN LEVEL (UNFRACTIONATED): Heparin Unfractionated: 0.32 IU/mL (ref 0.30–0.70)

## 2019-05-14 MED ORDER — POTASSIUM CHLORIDE CRYS ER 20 MEQ PO TBCR: 40.0000 meq | EXTENDED_RELEASE_TABLET | Freq: Once | ORAL | Status: DC

## 2019-05-14 MED ORDER — POTASSIUM CHLORIDE 10 MEQ/100ML IV SOLN
10.0000 meq | INTRAVENOUS | Status: AC
  Administered 2019-05-14 (×4): 10 meq via INTRAVENOUS
  Filled 2019-05-14 (×4): qty 100

## 2019-05-14 MED ORDER — INSULIN ASPART 100 UNIT/ML ~~LOC~~ SOLN
0.0000 [IU] | Freq: Four times a day (QID) | SUBCUTANEOUS | Status: DC
  Administered 2019-05-14 – 2019-05-19 (×9): 1 [IU] via SUBCUTANEOUS
  Administered 2019-05-19: 2 [IU] via SUBCUTANEOUS
  Administered 2019-05-19: 1 [IU] via SUBCUTANEOUS

## 2019-05-14 MED ORDER — TRAVASOL 10 % IV SOLN
INTRAVENOUS | Status: AC
  Administered 2019-05-14: 17:00:00 via INTRAVENOUS
  Filled 2019-05-14: qty 1094.4

## 2019-05-14 NOTE — Evaluation (Signed)
Occupational Therapy Evaluation Patient Details Name: LUCILE DIDONATO MRN: [id number] DOB: [birth year] Today's Date: 05/14/2019    History of Present Illness Pt is a 73 y/o male admitted secondary to worsening abdominal pain. Pt found to have SBO and is s/p exploratory laparotomy. Pt also with sepsis secondary to UTI. Pt with ICU stay following hypotension. PMH includes a fib, dementia, HTN, and L patellar tendon repair.    Clinical Impression   This 73 y/o male presents with the above. PTA pt requiring assist for ADL, receiving assist from spouse and Home care aides; was intermittently able to use slide board for transfers to w/c as well as use of hoyer for [place] transfers. Pt intermittently agitated this session and with decreased tolerance to activity (becoming increasingly agitated with attempts to move/mobility). He currently requires totalA for ADL and for repositioning in bed. Pt's spouse with preference to take pt home vs SNF at time of discharge at this time. Recommend home health services to maximize pt's safety and independence with ADL and mobility and to decrease level of caregiver burden. Will continue to follow acutely to maximize pt's strength and ability to assist with ADL and mobility at home.     Follow Up Recommendations  Home health OT;Supervision/Assistance - 24 hour(HHAide, pt spouse preferring home vs SNF)    Equipment Recommendations  None recommended by OT    Recommendations for Other Services Other (comment)(palliative consult)     Precautions / Restrictions Precautions Precautions: Fall;Other (comment) Precaution Comments: NG tube Restrictions Weight Bearing Restrictions: No      Mobility Bed Mobility Overal bed mobility: Needs Assistance Bed Mobility: Rolling Rolling: Total assist         General bed mobility comments: requires totalA for partial rolling to place pillow under hip for pressure relief, pt agitated with attempts so unable to attempt  further mobility   Transfers                 General transfer comment: deferred    Balance                                           ADL either performed or assessed with clinical judgement   ADL Overall ADL's : Needs assistance/impaired Eating/Feeding: NPO Eating/Feeding Details (indicate cue type and reason): currently NPO and spouse assisting with spoon feeding ice chips, reports pt typically able to self feed                                    General ADL Comments: pt currently dependent for all ADL                         Pertinent Vitals/Pain Pain Assessment: Faces Faces Pain Scale: Hurts little more Pain Location: LLE Pain Descriptors / Indicators: Grimacing;Guarding Pain Intervention(s): Monitored during session;Limited activity within patient's tolerance     Hand Dominance     Extremity/Trunk Assessment Upper Extremity Assessment Upper Extremity Assessment: Generalized weakness;RUE deficits/detail;Difficult to assess due to impaired cognition RUE Deficits / Details: pt with limited shoulder AROM, unable to fully assess as pt becoming agitated with attempts   Lower Extremity Assessment Lower Extremity Assessment: Defer to PT evaluation   Cervical / Trunk Assessment Cervical / Trunk Assessment: Kyphotic   Communication Communication Communication:  No difficulties   Cognition Arousal/Alertness: Awake/alert Behavior During Therapy: Flat affect;Agitated Overall Cognitive Status: History of cognitive impairments - at baseline                                 General Comments: Pt with dementia at baseline, however, wife reports agitation seems to be worse since admission. Pt agitated at times throughout session and required redirection. Wife very helpful for redirection. wife also reports pt with increased difficulty formulating words. noted with garbled speech at times this session   General Comments   VSS    Exercises Exercises: General Lower Extremity General Exercises - Lower Extremity Straight Leg Raises: 10 reps;Right;Supine   Shoulder Instructions      Home Living Family/patient expects to be discharged to:: Private residence Living Arrangements: Spouse/significant other Available Help at Discharge: Family;Personal care attendant;Available 24 hours/day Type of Home: House Home Access: Ramped entrance     Home Layout: Two level;Bed/bath upstairs Alternate Level Stairs-Number of Steps: pt sleeps in hospital bed on first floor             Home Equipment: Bedside commode;Hospital bed;Wheelchair - Education administrator (comment);Walker - 2 wheels(slide board, hoyer)   Additional Comments: Per wife, pt  with caregiver that assists with bathing and changing pt in the morning, afternoon, and evening for a couple hours.       Prior Functioning/Environment Level of Independence: Needs assistance  Gait / Transfers Assistance Needed: Needs assist with transfers using slide board or hoyer lift depending on the day. Wife reports she has trouble using hoyer secondary to shoulder injury.  ADL's / Homemaking Assistance Needed: Caregiver assists with ADLs.             OT Problem List: Decreased strength;Decreased range of motion;Decreased activity tolerance;Decreased safety awareness;Decreased cognition;Pain;Decreased knowledge of use of DME or AE      OT Treatment/Interventions: Self-care/ADL training;Neuromuscular education;DME and/or AE instruction;Patient/family education;Balance training;Therapeutic activities    OT Goals(Current goals can be found in the care plan section) Acute Rehab OT Goals Patient Stated Goal: per wife, to return home at d/c  OT Goal Formulation: With patient/family Time For Goal Achievement: 05/28/19 Potential to Achieve Goals: Fair  OT Frequency: Min 1X/week   Barriers to D/C:            Co-evaluation              AM-PAC OT "6 Clicks" Daily  Activity     Outcome Measure Help from another person eating meals?: Total Help from another person taking care of personal grooming?: Total Help from another person toileting, which includes using toliet, bedpan, or urinal?: Total Help from another person bathing (including washing, rinsing, drying)?: Total Help from another person to put on and taking off regular upper body clothing?: Total Help from another person to put on and taking off regular lower body clothing?: Total 6 Click Score: 6   End of Session Nurse Communication: Mobility status  Activity Tolerance: Treatment limited secondary to agitation Patient left: in bed;with call bell/phone within reach;with bed alarm set;with family/visitor present  OT Visit Diagnosis: Muscle weakness (generalized) (M62.81);Other symptoms and signs involving cognitive function                Time: 9211-9417 OT Time Calculation (min): 22 min Charges:  OT General Charges $OT Visit: 1 Visit OT Evaluation $OT Eval Moderate Complexity: [place]ick[place], OT [employer]  Pager [phone] Office [phone]  Raymondo Band 05/14/2019, 3:17 PM

## 2019-05-14 NOTE — Progress Notes (Signed)
I discussed / reviewed the pharmacy note by Dr. Devin Going and I agree with the resident's findings and plans as documented. No bleeding noted.  Nicole Cella, RPh Clinical Pharmacist 05/14/2019 9:50 AM     ANTICOAGULATION CONSULT NOTE  Pharmacy Consult for heparin Indication: atrial fibrillation  Patient Measurements: Height: 6' 0.01" (182.9 cm) Weight: 172 lb 9.9 oz (78.3 kg) IBW/kg (Calculated) : 77.62 Heparin Dosing Weight: 81 kg  Vital Signs: Temp: 97.4 F (36.3 C) (07/03 0805) Temp Source: Oral (07/03 0805) BP: 126/73 (07/03 0805) Pulse Rate: 74 (07/03 0805)  Labs: Recent Labs    05/12/19 0615 05/12/19 1956 05/13/19 0706 05/14/19 0224  HGB 9.3*  --  10.0* 10.1*  HCT 30.2*  --  32.6* 32.3*  PLT 258  --  260 257  HEPARINUNFRC <0.10* 0.35 0.38 0.32  CREATININE 0.98  --  0.84 0.70     Medical History: Past Medical History:  Diagnosis Date  . Aortic valve regurgitation 10/16/2018   Echo 07/28/2017:  EF 60-65, moderate AI, aortic root and ascending aorta mildly dilated (40 mm), ascending aorta 43 mm, MAC, mild MR, mild LAE, moderate RV enlargement, trivial TR // Echo 12/19: EF 60-65, normal wall motion, mild AI, moderate BAE, ascending aorta 43 mm, aortic root 39 mm  . BPH (benign prostatic hypertrophy)   . Chronic anticoagulation   . Chronic atrial fibrillation   . Diastolic dysfunction October 2010   Normal LV systolic function  . Hx SBO   . Hyperlipidemia   . Hypertension   . Patellar tendon rupture, left, initial encounter 02/10/2019  . Small bowel obstruction [facility])      Assessment: 73 yo male admitted with abdominal pain. Had a small bowel resection 05/12/19. He is on warfarin prior to admission for AFib. He is currently in AFib. Warfarin has been held perioperatively; last INR 6/29, check INR in AM in anticipation of warfarin restart in future. Heparin was restarted 05/12/19. H/h and plts stable s/p surgery.   Goal of Therapy:  Heparin level 0.3-0.7  units/ml Monitor platelets by anticoagulation protocol: Yes    Plan:  -Continue heparin at 1350 units/hr -Daily HL, CBC -INR in AM x 1   Mimi L. Devin Going, PharmD, MBA PGY1 Pharmacy Resident [phone] [employer]: [phone] Please check AMION for all [place] [place] phone numbers After 10:00 PM, call the [facility] [phone] 05/14/2019,9:49 AM

## 2019-05-14 NOTE — Progress Notes (Signed)
[facility] CONSULT NOTE   Pharmacy Consult for TPN Indication: prolonged ileus  Patient Measurements: Height: 6' 0.01" (182.9 cm) Weight: 172 lb 9.9 oz (78.3 kg) IBW/kg (Calculated) : 77.62 TPN AdjBW (KG): 80.1 Body mass index is 23.41 kg/m.   Assessment:  Admitted with abdominal pain. Patient has history of prior small bowel resection and appendectomy in 2004. CT on admission shows small bowel obstruction. He was first started on conservative management. Placement of NG tube was initially held due therapeutic INR of 3. On 6/26 warfarin was reversed and NG was placed for decompression. After a few days of no improvement, patient went to OR for small bowel resection with resultant lysis of adhesions.  GI: s/p small bowel resection 05/11/19. Pre-albumin 15.1. Has been NPO for the past 8 days. Emesis 295 mL.  Endo: BG 158.  Insulin requirements in the past 24 hours: 0 units Lytes: Phos up to 2.4, K 3.4 (replacing), Coca (10.2) wnl, Mg 1.8 Renal: SCr 0.8. BUN wnl. UOP 0.9 mL/kg/hr.  Pulm: RA Cards: Currently in AFib, on warfarin PTA, Heparin IV here. Off Diltiazem drip.  Hepatobil: LFTs wnl Tbili 1. TG 51 Neuro: dementia at baseline, on Aricept ID: Completed empiric course of Cefepime. WBC trending down. Afebrile.   TPN Access: PICC placed 7/1 TPN start date: 05/12/19 Nutritional Goals (per RD recommendation on 05/13/19):   KCal: 2200-2400 Protein: 105-120 g Fluid: > 2.2 L  Goal TPN rate: 80 mL/hr (this provides 109 g of protein, 345 g of dextrose, and 61 g of lipids which provides 2227 kCals per day, meeting 100% of patient needs)  Current Nutrition:   Plan:  -Increase TPN to goal rate of 80 mL/hr -This TPN provides 109 g of protein, 345 g of dextrose, and 61 g of lipids which provides 2227 kCals per day, meeting 100% of patient needs -Electrolytes in TPN: Continue increased Phos and K, others to standard, LZ:[id number] 1:1 -Add MVI, trace elements  Mon/Wed/Fri to TPN -Add SSI q6h and monitor with increases in TPN rate  -KCl 46mEq IV x4 ordered by MD  Sloan Leiter, PharmD, BCPS, BCCCP Clinical Pharmacist Please refer to [facility] for [place] numbers 05/14/2019 8:42 AM

## 2019-05-14 NOTE — Progress Notes (Signed)
PROGRESS NOTE    Russell Dillon  KGY:[id number] DOB: December 10, 1945 DOA: 05/05/2019 PCP: Marton Redwood, MD   Brief Narrative:  Patient is a 73 year old male with past medical history of chronic atrial fibrillation on warfarin, hypertension, hyperlipidemia, diastolic dysfunction with aortic valve regurgitation, history of SBO, BPH, dementia who presents with complaints of abdominal pain.  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  He was recently found to have urinary tract infection and was treated with antibiotics and outpatient.  He was complaining of abdomen pain on presentation.  CT imaging showed small bowel obstruction with transition point .  General surgery consulted. NGT placed on 05/07/2019. No improvement 5 days later. Pt was taken to OR for LOA. He was hypotensive post operatively and was on 4N ICU with neo drip. Currently off pressors.  General surgery following.  On TPN.  No flatus or bowel movement till today.   Assessment & Plan:   Principal Problem:   Sepsis secondary to UTI [facility]) Active Problems:   Atrial fibrillation, chronic   Long term (current) use of anticoagulants   SBO (small bowel obstruction) (HCC)   Dementia without behavioral disturbance (HCC)   AKI (acute kidney injury) ([place])   Transient hypotension   SBO:CT imaging showed small bowel obstruction with transition point .  General surgery consulted. NGT placed on 05/07/2019. No improvement 5 days later. Pt was taken to OR for LOA.  General surgery following. This morning he denies any abdomen pain.  Denies any flatus or bowel movement.  N.p.o.  On TPN  Hypotension status post surgery: Currently blood pressure stable.  He was put on pressure after surgery because of hypotension.  Suspected sepsis secondary to urinary tract infection: Urine culture showed diphtheroids.  Antibiotics has been discontinued.  Acute kidney injury: Resolved  Permanent A. fib on  anticoagulation:CHA2DS2-VASc score = at least 3.  Currently on heparin drip.  Resume Coumadin when appropriate and able to take by mouth.  History of dementia with behavioral disturbance: On Aricept at home.  Debility/deconditioning: Has been using wheelchair since patella rupture.  Will request for PT/OT evaluation when appropriate.    Nutrition Problem: Inadequate oral intake Etiology: altered GI function(SBO)      DVT prophylaxis: Heparin IV Code Status: Full Family Communication: None present at the bedside Disposition Plan: Awaiting bowel function   Consultants: General surgery  Procedures: Exploratory laparotomy with lysis of adhesions  Antimicrobials:  Anti-infectives (From admission, onward)   Start     Dose/Rate Route Frequency Ordered Stop   05/06/19 2100  vancomycin (VANCOCIN) 1,250 mg in sodium chloride 0.9 % 250 mL IVPB  Status:  Discontinued     1,250 mg 166.7 mL/hr over 90 Minutes Intravenous Every 24 hours 05/05/19 1849 05/05/19 2057   05/06/19 0800  ceFEPIme (MAXIPIME) 2 g in sodium chloride 0.9 % 100 mL IVPB  Status:  Discontinued     2 g 200 mL/hr over 30 Minutes Intravenous Every 12 hours 05/05/19 1849 05/05/19 2057   05/06/19 0800  ceFEPIme (MAXIPIME) 2 g in sodium chloride 0.9 % 100 mL IVPB  Status:  Discontinued     2 g 200 mL/hr over 30 Minutes Intravenous Every 12 hours 05/05/19 2114 05/11/19 1257   05/05/19 1830  ceFEPIme (MAXIPIME) 2 g in sodium chloride 0.9 % 100 mL IVPB     2 g 200 mL/hr over 30 Minutes Intravenous  Once 05/05/19 1817 05/05/19 2019   05/05/19 1830  metroNIDAZOLE (FLAGYL) IVPB  500 mg     500 mg 100 mL/hr over 60 Minutes Intravenous  Once 05/05/19 1817 05/05/19 2011   05/05/19 1830  vancomycin (VANCOCIN) IVPB 1000 mg/200 mL premix  Status:  Discontinued     1,000 mg 200 mL/hr over 60 Minutes Intravenous  Once 05/05/19 1817 05/05/19 1825   05/05/19 1830  vancomycin (VANCOCIN) 1,500 mg in sodium chloride 0.9 % 500 mL IVPB      1,500 mg 250 mL/hr over 120 Minutes Intravenous  Once 05/05/19 1825 05/05/19 2238      Subjective:  Patient seen and examined the bedside this morning.  Hemodynamically stable.  Denies any abdomen pain.  No bowel movement or flatus yet.  Denies any new complaints.   Objective: Vitals:   05/14/19 0500 05/14/19 0639 05/14/19 0805 05/14/19 1202  BP:  126/70 126/73 100/62  Pulse:  84 74 80  Resp:  18 14 18   Temp:   (!) 97.4 F (36.3 C) 98 F (36.7 C)  TempSrc:   Oral Oral  SpO2:  100% 100% 100%  Weight: 78.3 kg     Height:        Intake/Output Summary (Last 24 hours) at 05/14/2019 1426 Last data filed at 05/14/2019 1206 Gross per 24 hour  Intake 1786.38 ml  Output 1420 ml  Net 366.38 ml   Filed Weights   05/12/19 0500 05/13/19 0500 05/14/19 0500  Weight: 81.1 kg 79.1 kg 78.3 kg    Examination:  General exam: Weak, debilitated  HEENT: NG tube Respiratory system: Bilateral equal air entry, normal vesicular breath sounds, no wheezes or crackles  Cardiovascular system: S1 & S2 heard, RRR. No JVD, murmurs, rubs, gallops or clicks. No pedal edema. Gastrointestinal system: Abdomen is nondistended, soft ,appropriate tenderness post surgery, abdomen covered with dressing.  No bowel sounds Central nervous system: Alert and oriented. No focal neurological deficits. Extremities: No edema, no clubbing ,no cyanosis, distal peripheral pulses palpable. Skin: No rashes, lesions or ulcers,no icterus ,no pallor     Data Reviewed: I have personally reviewed following labs and imaging studies  CBC: Recent Labs  Lab 05/09/19 0609 05/10/19 0337 05/11/19 1516 05/12/19 0615 05/13/19 0706 05/14/19 0224  WBC 8.5 8.9  --  9.3 19.1* 11.3*  NEUTROABS  --   --   --  8.3*  --  9.5*  HGB 10.5* 10.8* 11.2* 9.3* 10.0* 10.1*  HCT 34.8* 35.2* 33.0* 30.2* 32.6* 32.3*  MCV 82.9 83.0  --  83.2 83.6 82.6  PLT 292 303  --  258 260 782   Basic Metabolic Panel: Recent Labs  Lab 05/10/19 0337  05/11/19 0430 05/11/19 1516 05/12/19 0615 05/13/19 0706 05/14/19 0224  NA 147* 147* 148* 145 143 142  K 3.0* 3.7 3.5 3.7 3.5 3.4*  CL 107 111  --  109 107 108  CO2 26 24  --  22 25 25   GLUCOSE 92 92  --  154* 184* 158*  BUN 18 23  --  18 17 18   CREATININE 0.86 0.90  --  0.98 0.84 0.70  CALCIUM 9.6 9.8  --  8.7* 8.7* 8.7*  MG  --   --   --  1.6* 2.0 1.8  PHOS  --   --   --  2.5 1.8* 2.4*   GFR: Estimated Creatinine Clearance: 88.9 mL/min (by C-G formula based on SCr of 0.7 mg/dL). Liver Function Tests: Recent Labs  Lab 05/12/19 0615 05/13/19 0706  AST 17 24  ALT 17 23  ALKPHOS  62 60  BILITOT 1.6* 1.0  PROT 6.6 6.5  ALBUMIN 3.1* 2.9*   No results for input(s): LIPASE, AMYLASE in the last 168 hours. No results for input(s): AMMONIA in the last 168 hours. Coagulation Profile: Recent Labs  Lab 05/08/19 0939 05/08/19 1624 05/09/19 0609 05/09/19 1444 05/10/19 0337  INR 2.1* 2.0* 1.9* 1.3* 1.3*   Cardiac Enzymes: No results for input(s): CKTOTAL, CKMB, CKMBINDEX, TROPONINI in the last 168 hours. BNP (last 3 results) No results for input(s): PROBNP in the last 8760 hours. HbA1C: Recent Labs    05/14/19 0401  HGBA1C 5.6   CBG: Recent Labs  Lab 05/14/19 1201  GLUCAP 124*   Lipid Profile: Recent Labs    05/12/19 0615  TRIG 51   Thyroid Function Tests: No results for input(s): TSH, T4TOTAL, FREET4, T3FREE, THYROIDAB in the last 72 hours. Anemia Panel: No results for input(s): VITAMINB12, FOLATE, FERRITIN, TIBC, IRON, RETICCTPCT in the last 72 hours. Sepsis Labs: Recent Labs  Lab 05/13/19 0915 05/14/19 0224  PROCALCITON 0.22 0.16    Recent Results (from the past 240 hour(s))  Blood Culture (routine x 2)     Status: None   Collection Time: 05/05/19  6:39 PM   Specimen: BLOOD  Result Value Ref Range Status   Specimen Description BLOOD RIGHT ANTECUBITAL  Final   Special Requests   Final    BOTTLES DRAWN AEROBIC AND ANAEROBIC Blood Culture results may  not be optimal due to an inadequate volume of blood received in culture bottles   Culture   Final    NO GROWTH 5 DAYS Performed at [facility], [place] [address], [place], [place] [address]    Report Status 05/10/2019 FINAL  Final  Blood Culture (routine x 2)     Status: None   Collection Time: 05/05/19  6:44 PM   Specimen: BLOOD LEFT FOREARM  Result Value Ref Range Status   Specimen Description BLOOD LEFT FOREARM  Final   Special Requests   Final    BOTTLES DRAWN AEROBIC ONLY Blood Culture adequate volume   Culture   Final    NO GROWTH 5 DAYS Performed at [facility], [place] [address], [place], [place] [address]    Report Status 05/10/2019 FINAL  Final  Urine culture     Status: Abnormal   Collection Time: 05/05/19  6:55 PM   Specimen: Urine, Random  Result Value Ref Range Status   Specimen Description URINE, RANDOM  Final   Special Requests NONE  Final   Culture (A)  Final    80,000 COLONIES/mL DIPHTHEROIDS(CORYNEBACTERIUM SPECIES) Standardized susceptibility testing for this organism is not available. Performed at [facility], [place] [address], [place], [place] [address]    Report Status 05/06/2019 FINAL  Final  SARS Coronavirus 2 (CEPHEID - Performed in [place] [facility] lab), Hosp Order     Status: None   Collection Time: 05/05/19  7:13 PM   Specimen: Nasopharyngeal Swab  Result Value Ref Range Status   SARS Coronavirus 2 NEGATIVE NEGATIVE Final    Comment: (NOTE) If result is NEGATIVE SARS-CoV-2 target nucleic acids are NOT DETECTED. The SARS-CoV-2 RNA is generally detectable in upper and lower  respiratory specimens during the acute phase of infection. The lowest  concentration of SARS-CoV-2 viral copies this assay can detect is 250  copies / mL. A negative result does not preclude SARS-CoV-2 infection  and should not be used as the sole basis for treatment or other  patient management decisions.  A negative result may occur with  improper specimen  collection / handling, submission of specimen other  than nasopharyngeal swab, presence of viral mutation(s) within the  areas targeted by this assay, and inadequate number of viral copies  (<250 copies / mL). A negative result must be combined with clinical  observations, patient history, and epidemiological information. If result is POSITIVE SARS-CoV-2 target nucleic acids are DETECTED. The SARS-CoV-2 RNA is generally detectable in upper and lower  respiratory specimens dur ing the acute phase of infection.  Positive  results are indicative of active infection with SARS-CoV-2.  Clinical  correlation with patient history and other diagnostic information is  necessary to determine patient infection status.  Positive results do  not rule out bacterial infection or co-infection with other viruses. If result is PRESUMPTIVE POSTIVE SARS-CoV-2 nucleic acids MAY BE PRESENT.   A presumptive positive result was obtained on the submitted specimen  and confirmed on repeat testing.  While 2019 novel coronavirus  (SARS-CoV-2) nucleic acids may be present in the submitted sample  additional confirmatory testing may be necessary for epidemiological  and / or clinical management purposes  to differentiate between  SARS-CoV-2 and other Sarbecovirus currently known to infect humans.  If clinically indicated additional testing with an alternate test  methodology [phone]) is advised. The SARS-CoV-2 RNA is generally  detectable in upper and lower respiratory sp ecimens during the acute  phase of infection. The expected result is Negative. Fact Sheet for Patients:  StrictlyIdeas.no Fact Sheet for Healthcare Providers: BankingDealers.co.za This test is not yet approved or cleared by the [place] FDA and has been authorized for detection and/or diagnosis of SARS-CoV-2 by FDA under an Emergency Use Authorization (EUA).  This EUA will remain in effect  (meaning this test can be used) for the duration of the COVID-19 declaration under Section 564(b)(1) of the Act, 21 U.S.C. section 360bbb-3(b)(1), unless the authorization is terminated or revoked sooner. Performed at [facility], [place] [address], [place], [place] [address]   MRSA PCR Screening     Status: None   Collection Time: 05/11/19  6:41 PM   Specimen: Nasopharyngeal  Result Value Ref Range Status   MRSA by PCR NEGATIVE NEGATIVE Final    Comment:        The GeneXpert MRSA Assay (FDA approved for NASAL specimens only), is one component of a comprehensive MRSA colonization surveillance program. It is not intended to diagnose MRSA infection nor to guide or monitor treatment for MRSA infections. Performed at [facility], [place] [address], [place], [place] [address]   Culture, blood (Routine X 2) w Reflex to ID Panel     Status: None (Preliminary result)   Collection Time: 05/13/19 10:15 AM   Specimen: BLOOD LEFT ARM  Result Value Ref Range Status   Specimen Description BLOOD LEFT ARM  Final   Special Requests   Final    BOTTLES DRAWN AEROBIC ONLY Blood Culture adequate volume   Culture   Final    NO GROWTH < 24 HOURS Performed at [facility], [place] [address] [place], [place] [address]    Report Status PENDING  Incomplete  Culture, blood (Routine X 2) w Reflex to ID Panel     Status: None (Preliminary result)   Collection Time: 05/13/19 10:15 AM   Specimen: BLOOD LEFT ARM  Result Value Ref Range Status   Specimen Description BLOOD LEFT ARM  Final   Special Requests  Final    BOTTLES DRAWN AEROBIC ONLY Blood Culture adequate volume   Culture   Final    NO GROWTH < 24 HOURS Performed at [facility], [address], [place], [place] [address]    Report Status PENDING  Incomplete         Radiology Studies: No results found.      Scheduled Meds: . sodium chloride   Intravenous Once  . Chlorhexidine Gluconate Cloth  6 each Topical  Daily  . Chlorhexidine Gluconate Cloth  6 each Topical Q0600  . diltiazem  25 mg Intravenous Q6H  . insulin aspart  0-9 Units Subcutaneous Q6H  . mouth rinse  15 mL Mouth Rinse BID  . sodium chloride flush  10-40 mL Intracatheter Q12H  . sodium chloride flush  3 mL Intravenous Q12H   Continuous Infusions: . heparin 1,350 Units/hr (05/14/19 0013)  . lactated ringers 10 mL/hr at 05/11/19 1306  . methocarbamol (ROBAXIN) IV    . TPN ADULT (ION) 65 mL/hr at 05/13/19 1756  . TPN ADULT (ION)       LOS: 9 days    Time spent:35 mins. More than 50% of that time was spent in counseling and/or coordination of care.      Shelly Coss, MD [facility] Pager [phone]  If 7PM-7AM, please contact night-coverage [email or web address] Password [email or web address] 05/14/2019, 2:26 PM

## 2019-05-14 NOTE — Progress Notes (Signed)
Patient ID: Russell Dillon, male   DOB: [birth year], 73 y.o.   MRN: [id number] [facility] Progress Note:   3 Days Post-Op  Subjective: Mental status is awakened and somewhat confused;  Objective: Vital signs in last 24 hours: Temp:  [97.4 F (36.3 C)-98.3 F (36.8 C)] 97.4 F (36.3 C) (07/03 0805) Pulse Rate:  [68-84] 74 (07/03 0805) Resp:  [13-22] 14 (07/03 0805) BP: (106-126)/(69-87) 126/73 (07/03 0805) SpO2:  [99 %-100 %] 100 % (07/03 0805) Weight:  [78.3 kg] 78.3 kg (07/03 0500)  Intake/Output from previous day: 07/02 0701 - 07/03 0700 In: 2534.8 [I.V.:2284.3; IV Piggyback:250.5] Out: 1895 [Urine:1600; Emesis/NG output:295] Intake/Output this shift: Total I/O In: -  Out: 250 [Urine:250]  Physical Exam: Work of breathing is not labored;  Incisions covered;  Patient with mittens on.  Foley and NG in place;  NG output green.    Lab Results:  Results for orders placed or performed during the hospital encounter of 05/05/19 (from the past 48 hour(s))  Heparin level (unfractionated)     Status: None   Collection Time: 05/12/19  7:56 PM  Result Value Ref Range   Heparin Unfractionated 0.35 0.30 - 0.70 IU/mL    Comment: (NOTE) If heparin results are below expected values, and patient dosage has  been confirmed, suggest follow up testing of antithrombin III levels. Performed at [facility], [place] [address] [place], [place] [address]   Comprehensive metabolic panel     Status: Abnormal   Collection Time: 05/13/19  7:06 AM  Result Value Ref Range   Sodium 143 135 - 145 mmol/L   Potassium 3.5 3.5 - 5.1 mmol/L   Chloride 107 98 - 111 mmol/L   CO2 25 22 - 32 mmol/L   Glucose, Bld 184 (H) 70 - 99 mg/dL   BUN 17 8 - 23 mg/dL   Creatinine, Ser 0.84 0.61 - 1.24 mg/dL   Calcium 8.7 (L) 8.9 - 10.3 mg/dL   Total Protein 6.5 6.5 - 8.1 g/dL   Albumin 2.9 (L) 3.5 - 5.0 g/dL   AST 24 15 - 41 U/L   ALT 23 0 - 44 U/L   Alkaline Phosphatase 60 38 - 126 U/L   Total  Bilirubin 1.0 0.3 - 1.2 mg/dL   GFR calc non Af Amer >60 >60 mL/min   GFR calc Af Amer >60 >60 mL/min   Anion gap 11 5 - 15    Comment: Performed at [facility] [address] [place], [place] [address]  Magnesium     Status: None   Collection Time: 05/13/19  7:06 AM  Result Value Ref Range   Magnesium 2.0 1.7 - 2.4 mg/dL    Comment: Performed at [facility] [address], [place], [place] [address]  Phosphorus     Status: Abnormal   Collection Time: 05/13/19  7:06 AM  Result Value Ref Range   Phosphorus 1.8 (L) 2.5 - 4.6 mg/dL    Comment: Performed at [facility] [address], [place], [place] [address]  Heparin level (unfractionated)     Status: None   Collection Time: 05/13/19  7:06 AM  Result Value Ref Range   Heparin Unfractionated 0.38 0.30 - 0.70 IU/mL    Comment: (NOTE) If heparin results are below expected values, and patient dosage has  been confirmed, suggest follow up testing of antithrombin III levels. Performed at [facility], [place] [address], [place], [place] [address]  CBC     Status: Abnormal   Collection Time: 05/13/19  7:06 AM  Result Value Ref Range   WBC 19.1 (H) 4.0 - 10.5 K/uL   RBC 3.90 (L) 4.22 - 5.81 MIL/uL   Hemoglobin 10.0 (L) 13.0 - 17.0 g/dL   HCT 32.6 (L) 39.0 - 52.0 %   MCV 83.6 80.0 - 100.0 fL   MCH 25.6 (L) 26.0 - 34.0 pg   MCHC 30.7 30.0 - 36.0 g/dL   RDW 19.3 (H) 11.5 - 15.5 %   Platelets 260 150 - 400 K/uL   nRBC 0.0 0.0 - 0.2 %    Comment: Performed at [facility] [address], [place], [place] [address]  Procalcitonin - Baseline     Status: None   Collection Time: 05/13/19  9:15 AM  Result Value Ref Range   Procalcitonin 0.22 ng/mL    Comment:        Interpretation: PCT (Procalcitonin) <= 0.5 ng/mL: Systemic infection (sepsis) is not likely. Local bacterial infection is possible. (NOTE)       Sepsis PCT Algorithm           Lower Respiratory Tract                                       Infection PCT Algorithm    ----------------------------     ----------------------------         PCT < 0.25 ng/mL                PCT < 0.10 ng/mL         Strongly encourage             Strongly discourage   discontinuation of antibiotics    initiation of antibiotics    ----------------------------     -----------------------------       PCT 0.25 - 0.50 ng/mL            PCT 0.10 - 0.25 ng/mL               OR       >80% decrease in PCT            Discourage initiation of                                            antibiotics      Encourage discontinuation           of antibiotics    ----------------------------     -----------------------------         PCT >= 0.50 ng/mL              PCT 0.26 - 0.50 ng/mL               AND        <80% decrease in PCT             Encourage initiation of                                             antibiotics       Encourage continuation           of  antibiotics    ----------------------------     -----------------------------        PCT >= 0.50 ng/mL                  PCT > 0.50 ng/mL               AND         increase in PCT                  Strongly encourage                                      initiation of antibiotics    Strongly encourage escalation           of antibiotics                                     -----------------------------                                           PCT <= 0.25 ng/mL                                                 OR                                        > 80% decrease in PCT                                     Discontinue / Do not initiate                                             antibiotics Performed at [facility], [address] [place], [place] [address]   Culture, blood (Routine X 2) w Reflex to ID Panel     Status: None (Preliminary result)   Collection Time: 05/13/19 10:15 AM   Specimen: BLOOD LEFT ARM  Result Value Ref Range   Specimen Description BLOOD LEFT ARM    Special Requests       BOTTLES DRAWN AEROBIC ONLY Blood Culture adequate volume   Culture      NO GROWTH < 24 HOURS Performed at [facility], [place] [address] [place], [place] [address]    Report Status PENDING   Culture, blood (Routine X 2) w Reflex to ID Panel     Status: None (Preliminary result)   Collection Time: 05/13/19 10:15 AM   Specimen: BLOOD LEFT ARM  Result Value Ref Range   Specimen Description BLOOD LEFT ARM    Special Requests      BOTTLES DRAWN AEROBIC ONLY Blood Culture adequate volume   Culture      NO GROWTH < 24 HOURS Performed at [facility], [place]  [address], [place], [place] [address]    Report Status PENDING   Heparin level (unfractionated)     Status: None   Collection Time: 05/14/19  2:24 AM  Result Value Ref Range   Heparin Unfractionated 0.32 0.30 - 0.70 IU/mL    Comment: (NOTE) If heparin results are below expected values, and patient dosage has  been confirmed, suggest follow up testing of antithrombin III levels. Performed at [facility], [place] [address] [place], [place] [address]   CBC with Differential/Platelet     Status: Abnormal   Collection Time: 05/14/19  2:24 AM  Result Value Ref Range   WBC 11.3 (H) 4.0 - 10.5 K/uL   RBC 3.91 (L) 4.22 - 5.81 MIL/uL   Hemoglobin 10.1 (L) 13.0 - 17.0 g/dL   HCT 32.3 (L) 39.0 - 52.0 %   MCV 82.6 80.0 - 100.0 fL   MCH 25.8 (L) 26.0 - 34.0 pg   MCHC 31.3 30.0 - 36.0 g/dL   RDW 19.5 (H) 11.5 - 15.5 %   Platelets 257 150 - 400 K/uL   nRBC 0.0 0.0 - 0.2 %   Neutrophils Relative % 83 %   Neutro Abs 9.5 (H) 1.7 - 7.7 K/uL   Lymphocytes Relative 10 %   Lymphs Abs 1.1 0.7 - 4.0 K/uL   Monocytes Relative 5 %   Monocytes Absolute 0.6 0.1 - 1.0 K/uL   Eosinophils Relative 1 %   Eosinophils Absolute 0.1 0.0 - 0.5 K/uL   Basophils Relative 0 %   Basophils Absolute 0.0 0.0 - 0.1 K/uL   Immature Granulocytes 1 %   Abs Immature Granulocytes 0.06 0.00 - 0.07 K/uL    Comment: Performed at [facility],  [place] [address], [place], [place] [address]  Basic metabolic panel     Status: Abnormal   Collection Time: 05/14/19  2:24 AM  Result Value Ref Range   Sodium 142 135 - 145 mmol/L   Potassium 3.4 (L) 3.5 - 5.1 mmol/L   Chloride 108 98 - 111 mmol/L   CO2 25 22 - 32 mmol/L   Glucose, Bld 158 (H) 70 - 99 mg/dL   BUN 18 8 - 23 mg/dL   Creatinine, Ser 0.70 0.61 - 1.24 mg/dL   Calcium 8.7 (L) 8.9 - 10.3 mg/dL   GFR calc non Af Amer >60 >60 mL/min   GFR calc Af Amer >60 >60 mL/min   Anion gap 9 5 - 15    Comment: Performed at [facility], [place] [address], [place], [place] [address]  Procalcitonin     Status: None   Collection Time: 05/14/19  2:24 AM  Result Value Ref Range   Procalcitonin 0.16 ng/mL    Comment:        Interpretation: PCT (Procalcitonin) <= 0.5 ng/mL: Systemic infection (sepsis) is not likely. Local bacterial infection is possible. (NOTE)       Sepsis PCT Algorithm           Lower Respiratory Tract                                      Infection PCT Algorithm    ----------------------------     ----------------------------         PCT < 0.25 ng/mL                PCT < 0.10 ng/mL  Strongly encourage             Strongly discourage   discontinuation of antibiotics    initiation of antibiotics    ----------------------------     -----------------------------       PCT 0.25 - 0.50 ng/mL            PCT 0.10 - 0.25 ng/mL               OR       >80% decrease in PCT            Discourage initiation of                                            antibiotics      Encourage discontinuation           of antibiotics    ----------------------------     -----------------------------         PCT >= 0.50 ng/mL              PCT 0.26 - 0.50 ng/mL               AND        <80% decrease in PCT             Encourage initiation of                                             antibiotics       Encourage continuation           of antibiotics    ----------------------------      -----------------------------        PCT >= 0.50 ng/mL                  PCT > 0.50 ng/mL               AND         increase in PCT                  Strongly encourage                                      initiation of antibiotics    Strongly encourage escalation           of antibiotics                                     -----------------------------                                           PCT <= 0.25 ng/mL                                                 OR                                        >  80% decrease in PCT                                     Discontinue / Do not initiate                                             antibiotics Performed at [facility], [place] [address] [place], [place] [address]   Phosphorus     Status: Abnormal   Collection Time: 05/14/19  2:24 AM  Result Value Ref Range   Phosphorus 2.4 (L) 2.5 - 4.6 mg/dL    Comment: Performed at [facility] [address], [place], [place] [address]  Magnesium     Status: None   Collection Time: 05/14/19  2:24 AM  Result Value Ref Range   Magnesium 1.8 1.7 - 2.4 mg/dL    Comment: Performed at [facility] [address] [place], [place] [address]    Radiology/Results: Dg Chest Port 1 View  Result Date: 05/12/2019 CLINICAL DATA:  PICC placement. EXAM: PORTABLE CHEST 1 VIEW COMPARISON:  05/05/2019 FINDINGS: The patient is rotated to the left. An enteric tube has been placed and courses into the upper abdomen with tip not imaged. A new right PICC terminates near the superior cavoatrial junction. The cardiomediastinal silhouette is unchanged. Aortic atherosclerosis is noted. There is persistent slight elevation of the left hemidiaphragm with minimal basilar atelectasis. No confluent airspace opacity, overt pulmonary edema, pleural effusion, pneumothorax is identified. No acute osseous abnormality is seen. IMPRESSION: 1. Right PICC terminates near the superior cavoatrial junction. 2. Minimal left  basilar atelectasis. Electronically Signed   By: Logan Bores M.D.   On: 05/12/2019 11:42    Anti-infectives: Anti-infectives (From admission, onward)   Start     Dose/Rate Route Frequency Ordered Stop   05/06/19 2100  vancomycin (VANCOCIN) 1,250 mg in sodium chloride 0.9 % 250 mL IVPB  Status:  Discontinued     1,250 mg 166.7 mL/hr over 90 Minutes Intravenous Every 24 hours 05/05/19 1849 05/05/19 2057   05/06/19 0800  ceFEPIme (MAXIPIME) 2 g in sodium chloride 0.9 % 100 mL IVPB  Status:  Discontinued     2 g 200 mL/hr over 30 Minutes Intravenous Every 12 hours 05/05/19 1849 05/05/19 2057   05/06/19 0800  ceFEPIme (MAXIPIME) 2 g in sodium chloride 0.9 % 100 mL IVPB  Status:  Discontinued     2 g 200 mL/hr over 30 Minutes Intravenous Every 12 hours 05/05/19 2114 05/11/19 1257   05/05/19 1830  ceFEPIme (MAXIPIME) 2 g in sodium chloride 0.9 % 100 mL IVPB     2 g 200 mL/hr over 30 Minutes Intravenous  Once 05/05/19 1817 05/05/19 2019   05/05/19 1830  metroNIDAZOLE (FLAGYL) IVPB 500 mg     500 mg 100 mL/hr over 60 Minutes Intravenous  Once 05/05/19 1817 05/05/19 2011   05/05/19 1830  vancomycin (VANCOCIN) IVPB 1000 mg/200 mL premix  Status:  Discontinued     1,000 mg 200 mL/hr over 60 Minutes Intravenous  Once 05/05/19 1817 05/05/19 1825   05/05/19 1830  vancomycin (VANCOCIN) 1,500 mg in sodium chloride 0.9 % 500 mL IVPB     1,500 mg 250 mL/hr over 120 Minutes Intravenous  Once 05/05/19 1825 05/05/19 2238  Assessment/Plan: Problem List: Patient Active Problem List   Diagnosis Date Noted  . Sepsis secondary to UTI ([place]) 05/05/2019  . AKI (acute kidney injury) ([place]) 05/05/2019  . Transient hypotension 05/05/2019  . Sepsis, Gram negative ([place]) 03/18/2019  . Hypoalbuminemia   . Leukocytosis   . Hyperglycemia   . Acute on chronic anemia   . Rupture of left patellar tendon 02/15/2019  . Postoperative pain   . Benign prostatic hyperplasia   . Bipolar 1 disorder ([place]) 02/14/2019   . Dementia without behavioral disturbance ([place]) 02/14/2019  . Patellar tendon rupture, left, initial encounter 02/10/2019  . SIRS (systemic inflammatory response syndrome) ([place]) 02/09/2019  . Osteomyelitis ([place]) 02/09/2019  . Acute blood loss anemia 02/09/2019  . Aortic valve regurgitation 10/16/2018  . Encounter for therapeutic drug monitoring 12/02/2013  . SBO (small bowel obstruction) ([place]) 07/08/2013  . Hyperlipidemia 12/20/2011  . HTN (hypertension) 06/04/2011  . Atrial fibrillation, chronic 03/01/2011  . Long term (current) use of anticoagulants 03/01/2011  . GERD 10/11/2009  . DIVERTICULOSIS OF COLON 10/11/2009    No flatus noted;  Continue Foley and NG 3 Days Post-Op    LOS: 9 days   Matt B. Hassell Done, MD, [facility] [phone] beeper [phone]  05/14/2019 11:18 AM

## 2019-05-15 LAB — BASIC METABOLIC PANEL
Anion gap: 8 (ref 5–15)
BUN: 29 mg/dL — ABNORMAL HIGH (ref 8–23)
CO2: 23 mmol/L (ref 22–32)
Calcium: 9 mg/dL (ref 8.9–10.3)
Chloride: 108 mmol/L (ref 98–111)
Creatinine, Ser: 0.74 mg/dL (ref 0.61–1.24)
GFR calc Af Amer: >60 mL/min (ref >60)
GFR calc non Af Amer: >60 mL/min (ref >60)
Glucose, Bld: 133 mg/dL — ABNORMAL HIGH (ref 70–99)
Potassium: 4.6 mmol/L (ref 3.5–5.1)
Sodium: 139 mmol/L (ref 135–145)

## 2019-05-15 LAB — HEPARIN LEVEL (UNFRACTIONATED): Heparin Unfractionated: 0.3 IU/mL (ref 0.30–0.70)

## 2019-05-15 LAB — TYPE AND SCREEN
ABO/RH(D): A POS
Antibody Screen: NEGATIVE
Unit division: 0
Unit division: 0

## 2019-05-15 LAB — GLUCOSE, CAPILLARY
Glucose-Capillary: 118 mg/dL — ABNORMAL HIGH (ref 70–99)
Glucose-Capillary: 119 mg/dL — ABNORMAL HIGH (ref 70–99)
Glucose-Capillary: 121 mg/dL — ABNORMAL HIGH (ref 70–99)
Glucose-Capillary: 128 mg/dL — ABNORMAL HIGH (ref 70–99)
Glucose-Capillary: 131 mg/dL — ABNORMAL HIGH (ref 70–99)
Glucose-Capillary: 133 mg/dL — ABNORMAL HIGH (ref 70–99)

## 2019-05-15 LAB — CBC
HCT: 33.7 % — ABNORMAL LOW (ref 39.0–52.0)
Hemoglobin: 10.5 g/dL — ABNORMAL LOW (ref 13.0–17.0)
MCH: 26 pg (ref 26.0–34.0)
MCHC: 31.2 g/dL (ref 30.0–36.0)
MCV: 83.4 fL (ref 80.0–100.0)
Platelets: 221 10*3/uL (ref 150–400)
RBC: 4.04 MIL/uL — ABNORMAL LOW (ref 4.22–5.81)
RDW: 19.9 % — ABNORMAL HIGH (ref 11.5–15.5)
WBC: 8.4 10*3/uL (ref 4.0–10.5)
nRBC: 0 % (ref 0.0–0.2)

## 2019-05-15 LAB — BPAM RBC
Blood Product Expiration Date: 202007122359
Blood Product Expiration Date: 202007122359
ISSUE DATE / TIME: 202006301407
ISSUE DATE / TIME: 202006301407
Unit Type and Rh: 6200
Unit Type and Rh: 6200

## 2019-05-15 LAB — PROTIME-INR
INR: 1.1 (ref 0.8–1.2)
Prothrombin Time: 14.2 seconds (ref 11.4–15.2)

## 2019-05-15 LAB — PHOSPHORUS: Phosphorus: 3.2 mg/dL (ref 2.5–4.6)

## 2019-05-15 LAB — MAGNESIUM: Magnesium: 1.8 mg/dL (ref 1.7–2.4)

## 2019-05-15 LAB — PROCALCITONIN: Procalcitonin: <0.1 ng/mL

## 2019-05-15 MED ORDER — TRAVASOL 10 % IV SOLN: INTRAVENOUS | Status: DC

## 2019-05-15 MED ORDER — TRAVASOL 10 % IV SOLN
INTRAVENOUS | Status: AC
  Administered 2019-05-15: 18:00:00 via INTRAVENOUS
  Filled 2019-05-15: qty 1094.4

## 2019-05-15 NOTE — Progress Notes (Addendum)
Patient ID: Russell Dillon, male   DOB: [birth year], 73 y.o.   MRN: [id number] [facility] Progress Note:   4 Days Post-Op  Subjective: Mental status is unchanged Objective: Vital signs in last 24 hours: Temp:  [97.7 F (36.5 C)-98.1 F (36.7 C)] 98 F (36.7 C) (07/04 0924) Pulse Rate:  [80-97] 97 (07/04 0924) Resp:  [13-18] 18 (07/04 0924) BP: (100-119)/(62-73) 105/67 (07/04 0924) SpO2:  [100 %] 100 % (07/04 0924) Weight:  [78.8 kg] 78.8 kg (07/04 0500)  Intake/Output from previous day: 07/03 0701 - 07/04 0700 In: 990.4 [I.V.:990.4] Out: 2180 [Urine:1950; Emesis/NG output:230] Intake/Output this shift: No intake/output data recorded.  Physical Exam: Work of breathing is normal.  Wound clean and being repacked  Lab Results:  Results for orders placed or performed during the hospital encounter of 05/05/19 (from the past 48 hour(s))  Heparin level (unfractionated)     Status: None   Collection Time: 05/14/19  2:24 AM  Result Value Ref Range   Heparin Unfractionated 0.32 0.30 - 0.70 IU/mL    Comment: (NOTE) If heparin results are below expected values, and patient dosage has  been confirmed, suggest follow up testing of antithrombin III levels. Performed at [facility], [place] [address] [place], [place] [address]   CBC with Differential/Platelet     Status: Abnormal   Collection Time: 05/14/19  2:24 AM  Result Value Ref Range   WBC 11.3 (H) 4.0 - 10.5 K/uL   RBC 3.91 (L) 4.22 - 5.81 MIL/uL   Hemoglobin 10.1 (L) 13.0 - 17.0 g/dL   HCT 32.3 (L) 39.0 - 52.0 %   MCV 82.6 80.0 - 100.0 fL   MCH 25.8 (L) 26.0 - 34.0 pg   MCHC 31.3 30.0 - 36.0 g/dL   RDW 19.5 (H) 11.5 - 15.5 %   Platelets 257 150 - 400 K/uL   nRBC 0.0 0.0 - 0.2 %   Neutrophils Relative % 83 %   Neutro Abs 9.5 (H) 1.7 - 7.7 K/uL   Lymphocytes Relative 10 %   Lymphs Abs 1.1 0.7 - 4.0 K/uL   Monocytes Relative 5 %   Monocytes Absolute 0.6 0.1 - 1.0 K/uL   Eosinophils Relative 1 %    Eosinophils Absolute 0.1 0.0 - 0.5 K/uL   Basophils Relative 0 %   Basophils Absolute 0.0 0.0 - 0.1 K/uL   Immature Granulocytes 1 %   Abs Immature Granulocytes 0.06 0.00 - 0.07 K/uL    Comment: Performed at [facility], [place] [address], [place], [place] [address]  Basic metabolic panel     Status: Abnormal   Collection Time: 05/14/19  2:24 AM  Result Value Ref Range   Sodium 142 135 - 145 mmol/L   Potassium 3.4 (L) 3.5 - 5.1 mmol/L   Chloride 108 98 - 111 mmol/L   CO2 25 22 - 32 mmol/L   Glucose, Bld 158 (H) 70 - 99 mg/dL   BUN 18 8 - 23 mg/dL   Creatinine, Ser 0.70 0.61 - 1.24 mg/dL   Calcium 8.7 (L) 8.9 - 10.3 mg/dL   GFR calc non Af Amer >60 >60 mL/min   GFR calc Af Amer >60 >60 mL/min   Anion gap 9 5 - 15    Comment: Performed at [facility], [place] [address], [place],  [address]  Procalcitonin     Status: None   Collection Time: 05/14/19  2:24 AM  Result Value Ref Range  Procalcitonin 0.16 ng/mL    Comment:        Interpretation: PCT (Procalcitonin) <= 0.5 ng/mL: Systemic infection (sepsis) is not likely. Local bacterial infection is possible. (NOTE)       Sepsis PCT Algorithm           Lower Respiratory Tract                                      Infection PCT Algorithm    ----------------------------     ----------------------------         PCT < 0.25 ng/mL                PCT < 0.10 ng/mL         Strongly encourage             Strongly discourage   discontinuation of antibiotics    initiation of antibiotics    ----------------------------     -----------------------------       PCT 0.25 - 0.50 ng/mL            PCT 0.10 - 0.25 ng/mL               OR       >80% decrease in PCT            Discourage initiation of                                            antibiotics      Encourage discontinuation           of antibiotics    ----------------------------     -----------------------------         PCT >= 0.50 ng/mL              PCT 0.26 - 0.50 ng/mL                AND        <80% decrease in PCT             Encourage initiation of                                             antibiotics       Encourage continuation           of antibiotics    ----------------------------     -----------------------------        PCT >= 0.50 ng/mL                  PCT > 0.50 ng/mL               AND         increase in PCT                  Strongly encourage                                      initiation of antibiotics    Strongly encourage escalation           of antibiotics                                     -----------------------------  PCT <= 0.25 ng/mL                                                 OR                                        > 80% decrease in PCT                                     Discontinue / Do not initiate                                             antibiotics Performed at [facility], [place] [address] [place], [place] [address]   Phosphorus     Status: Abnormal   Collection Time: 05/14/19  2:24 AM  Result Value Ref Range   Phosphorus 2.4 (L) 2.5 - 4.6 mg/dL    Comment: Performed at [facility] [address] [place], [place] [address]  Magnesium     Status: None   Collection Time: 05/14/19  2:24 AM  Result Value Ref Range   Magnesium 1.8 1.7 - 2.4 mg/dL    Comment: Performed at [facility] [address] [place], [place] [address]  Hemoglobin A1c     Status: None   Collection Time: 05/14/19  4:01 AM  Result Value Ref Range   Hgb A1c MFr Bld 5.6 4.8 - 5.6 %    Comment: (NOTE) Pre diabetes:          5.7%-6.4% Diabetes:              >6.4% Glycemic control for   <7.0% adults with diabetes    Mean Plasma Glucose 114.02 mg/dL    Comment: Performed at [facility] [address], [place], [place] [address]  Glucose, capillary     Status: Abnormal   Collection Time: 05/14/19 12:01 PM  Result Value Ref Range   Glucose-Capillary 124 (H) 70 - 99  mg/dL  Glucose, capillary     Status: Abnormal   Collection Time: 05/14/19  5:51 PM  Result Value Ref Range   Glucose-Capillary 121 (H) 70 - 99 mg/dL  Glucose, capillary     Status: Abnormal   Collection Time: 05/14/19 11:59 PM  Result Value Ref Range   Glucose-Capillary 131 (H) 70 - 99 mg/dL  Glucose, capillary     Status: Abnormal   Collection Time: 05/15/19  6:03 AM  Result Value Ref Range   Glucose-Capillary 119 (H) 70 - 99 mg/dL  Glucose, capillary     Status: Abnormal   Collection Time: 05/15/19  8:12 AM  Result Value Ref Range   Glucose-Capillary 133 (H) 70 - 99 mg/dL  Heparin level (unfractionated)     Status: None   Collection Time: 05/15/19  8:49 AM  Result Value Ref Range   Heparin Unfractionated 0.30 0.30 - 0.70 IU/mL    Comment: (NOTE) If heparin results are below expected values, and patient dosage has  been confirmed, suggest follow up testing of antithrombin  III levels. Performed at [facility], [place] [address] [place], [place] [address]   CBC     Status: Abnormal   Collection Time: 05/15/19  8:49 AM  Result Value Ref Range   WBC 8.4 4.0 - 10.5 K/uL   RBC 4.04 (L) 4.22 - 5.81 MIL/uL   Hemoglobin 10.5 (L) 13.0 - 17.0 g/dL   HCT 33.7 (L) 39.0 - 52.0 %   MCV 83.4 80.0 - 100.0 fL   MCH 26.0 26.0 - 34.0 pg   MCHC 31.2 30.0 - 36.0 g/dL   RDW 19.9 (H) 11.5 - 15.5 %   Platelets 221 150 - 400 K/uL   nRBC 0.0 0.0 - 0.2 %    Comment: Performed at [facility], [place] [address] [place], [place] [address]  Basic metabolic panel     Status: Abnormal   Collection Time: 05/15/19  8:49 AM  Result Value Ref Range   Sodium 139 135 - 145 mmol/L   Potassium 4.6 3.5 - 5.1 mmol/L   Chloride 108 98 - 111 mmol/L   CO2 23 22 - 32 mmol/L   Glucose, Bld 133 (H) 70 - 99 mg/dL   BUN 29 (H) 8 - 23 mg/dL   Creatinine, Ser 0.74 0.61 - 1.24 mg/dL   Calcium 9.0 8.9 - 10.3 mg/dL   GFR calc non Af Amer >60 >60 mL/min   GFR calc Af Amer >60 >60 mL/min   Anion gap 8 5 - 15     Comment: Performed at [facility] [address], [place], [place] [address]  Magnesium     Status: None   Collection Time: 05/15/19  8:49 AM  Result Value Ref Range   Magnesium 1.8 1.7 - 2.4 mg/dL    Comment: Performed at [facility] [address], [place], [place] [address]  Phosphorus     Status: None   Collection Time: 05/15/19  8:49 AM  Result Value Ref Range   Phosphorus 3.2 2.5 - 4.6 mg/dL    Comment: Performed at [facility] [address] [place], [place] [address]  Protime-INR     Status: None   Collection Time: 05/15/19  8:49 AM  Result Value Ref Range   Prothrombin Time 14.2 11.4 - 15.2 seconds   INR 1.1 0.8 - 1.2    Comment: (NOTE) INR goal varies based on device and disease states. Performed at [facility], [place] [address], [place], [place] [address]     Radiology/Results: No results found.  Anti-infectives: Anti-infectives (From admission, onward)   Start     Dose/Rate Route Frequency Ordered Stop   05/06/19 2100  vancomycin (VANCOCIN) 1,250 mg in sodium chloride 0.9 % 250 mL IVPB  Status:  Discontinued     1,250 mg 166.7 mL/hr over 90 Minutes Intravenous Every 24 hours 05/05/19 1849 05/05/19 2057   05/06/19 0800  ceFEPIme (MAXIPIME) 2 g in sodium chloride 0.9 % 100 mL IVPB  Status:  Discontinued     2 g 200 mL/hr over 30 Minutes Intravenous Every 12 hours 05/05/19 1849 05/05/19 2057   05/06/19 0800  ceFEPIme (MAXIPIME) 2 g in sodium chloride 0.9 % 100 mL IVPB  Status:  Discontinued     2 g 200 mL/hr over 30 Minutes Intravenous Every 12 hours 05/05/19 2114 05/11/19 1257   05/05/19 1830  ceFEPIme (MAXIPIME) 2 g in sodium chloride 0.9 % 100 mL IVPB     2 g 200  mL/hr over 30 Minutes Intravenous  Once 05/05/19 1817 05/05/19 2019   05/05/19 1830  metroNIDAZOLE (FLAGYL) IVPB 500 mg     500 mg 100 mL/hr over 60 Minutes Intravenous  Once 05/05/19 1817 05/05/19 2011   05/05/19 1830  vancomycin (VANCOCIN) IVPB 1000 mg/200 mL premix   Status:  Discontinued     1,000 mg 200 mL/hr over 60 Minutes Intravenous  Once 05/05/19 1817 05/05/19 1825   05/05/19 1830  vancomycin (VANCOCIN) 1,500 mg in sodium chloride 0.9 % 500 mL IVPB     1,500 mg 250 mL/hr over 120 Minutes Intravenous  Once 05/05/19 1825 05/05/19 2238      Assessment/Plan: Problem List: Patient Active Problem List   Diagnosis Date Noted  . Sepsis secondary to UTI ([place]) 05/05/2019  . AKI (acute kidney injury) ([place]) 05/05/2019  . Transient hypotension 05/05/2019  . Sepsis, Gram negative ([place]) 03/18/2019  . Hypoalbuminemia   . Leukocytosis   . Hyperglycemia   . Acute on chronic anemia   . Rupture of left patellar tendon 02/15/2019  . Postoperative pain   . Benign prostatic hyperplasia   . Bipolar 1 disorder ([place]) 02/14/2019  . Dementia without behavioral disturbance ([place]) 02/14/2019  . Patellar tendon rupture, left, initial encounter 02/10/2019  . SIRS (systemic inflammatory response syndrome) ([place]) 02/09/2019  . Osteomyelitis ([place]) 02/09/2019  . Acute blood loss anemia 02/09/2019  . Aortic valve regurgitation 10/16/2018  . Encounter for therapeutic drug monitoring 12/02/2013  . SBO (small bowel obstruction) ([place]) 07/08/2013  . Hyperlipidemia 12/20/2011  . HTN (hypertension) 06/04/2011  . Atrial fibrillation, chronic 03/01/2011  . Long term (current) use of anticoagulants 03/01/2011  . GERD 10/11/2009  . DIVERTICULOSIS OF COLON 10/11/2009    Continue Foley and NG-no flatus yet.  On TNA   4 Days Post-Op    LOS: 10 days   Matt B. Hassell Done, MD, [facility] [phone] beeper [phone]  05/15/2019 10:57 AM

## 2019-05-15 NOTE — Progress Notes (Signed)
PHARMACY - ADULT TOTAL PARENTERAL NUTRITION CONSULT NOTE   Pharmacy Consult for TPN Indication: prolonged ileus  Patient Measurements: Height: 6' 0.01" (182.9 cm) Weight: 173 lb 11.6 oz (78.8 kg) IBW/kg (Calculated) : 77.62 TPN AdjBW (KG): 80.1 Body mass index is 23.56 kg/m.   Assessment:  Admitted with abdominal pain. Patient has history of prior small bowel resection and appendectomy in 2004. CT on admission shows small bowel obstruction. He was first started on conservative management. Placement of NG tube was initially held due therapeutic INR of 3. On 6/26 warfarin was reversed and NG was placed for decompression. After a few days of no improvement, patient went to OR for small bowel resection with resultant lysis of adhesions.  GI: s/p small bowel resection 05/11/19. Pre-albumin 15.1. Has been NPO for the past 8 days. Emesis 230 mL. No BM.  Endo: CBGs <150 Insulin requirements in the past 24 hours: 3 units Lytes: K and Phos now wnl. Mg 1.8. CoCa 9.9.  Renal: SCr 0.74. BUN 18>>29. UOP 0.9 mL/kg/hr.  Pulm: RA Cards: Currently in AFib, on warfarin PTA, Heparin IV here. Off Diltiazem drip.  Hepatobil: LFTs wnl Tbili 1. TG 51 Neuro: dementia at baseline, on Aricept ID: Completed empiric course of Cefepime. WBC trending down. Afebrile.   TPN Access: PICC placed 7/1 TPN start date: 05/12/19 Nutritional Goals (per RD recommendation on 05/13/19):   KCal: 2200-2400 Protein: 105-120 g Fluid: > 2.2 L  Goal TPN rate: 80 mL/hr (this provides 109 g of protein, 345 g of dextrose, and 61 g of lipids which provides 2227 kCals per day, meeting 100% of patient needs)  Current Nutrition:   Plan:  -Continue TPN at goal rate of 80 mL/hr -This TPN provides 109 g of protein, 345 g of dextrose, and 61 g of lipids which provides 2227 kCals per day, meeting 100% of patient needs -Electrolytes in TPN: Continue increased Phos and K, increase Mg, others to standard, [id number] 1:1 -Add MVI, trace  elements Mon/Wed/Fri to TPN -Add SSI q6h and monitor with increases in TPN rate   Sloan Leiter, PharmD, BCPS, BCCCP Clinical Pharmacist Please refer to [facility] for [place] numbers 05/15/2019 8:44 AM

## 2019-05-15 NOTE — Progress Notes (Addendum)
ANTICOAGULATION CONSULT NOTE  Pharmacy Consult for heparin Indication: atrial fibrillation  Patient Measurements: Height: 6' 0.01" (182.9 cm) Weight: 173 lb 11.6 oz (78.8 kg) IBW/kg (Calculated) : 77.62 Heparin Dosing Weight: 81 kg  Vital Signs: Temp: 98 F (36.7 C) (07/04 0521) Temp Source: Oral (07/04 0521) BP: 101/68 (07/04 0606) Pulse Rate: 84 (07/04 0606)  Labs: Recent Labs    05/12/19 1956 05/13/19 0706 05/14/19 0224  HGB  --  10.0* 10.1*  HCT  --  32.6* 32.3*  PLT  --  260 257  HEPARINUNFRC 0.35 0.38 0.32  CREATININE  --  0.84 0.70     Medical History: Past Medical History:  Diagnosis Date  . Aortic valve regurgitation 10/16/2018   Echo 07/28/2017:  EF 60-65, moderate AI, aortic root and ascending aorta mildly dilated (40 mm), ascending aorta 43 mm, MAC, mild MR, mild LAE, moderate RV enlargement, trivial TR // Echo 12/19: EF 60-65, normal wall motion, mild AI, moderate BAE, ascending aorta 43 mm, aortic root 39 mm  . BPH (benign prostatic hypertrophy)   . Chronic anticoagulation   . Chronic atrial fibrillation   . Diastolic dysfunction October 2010   Normal LV systolic function  . Hx SBO   . Hyperlipidemia   . Hypertension   . Patellar tendon rupture, left, initial encounter 02/10/2019  . Small bowel obstruction [facility])      Assessment: 73 yo male admitted with abdominal pain. Had a small bowel resection 05/12/19. He is on warfarin prior to admission for AFib. He is currently in AFib. Warfarin has been held perioperatively; Heparin was restarted 05/12/19 s/p surgery.   Heparin level therapeutic today but at low end of range. CBC strong and stable at hgb 10.5, hct 33.7, platelet counts 221. No signs or symptoms of bleeding. INR today @ 1.1.   Goal of Therapy:  Heparin level 0.3-0.7 units/ml Monitor platelets by anticoagulation protocol: Yes    Plan:  - Increase heparin to 1450 units / hr to ensure therapeutic window.  - Follow-up HL / CBC with tomorrow AM  labs - Daily HL, CBC - follow-up warfarin restart plans  Thank you for the interesting consult and for involving pharmacy in this patient's care.  Tamela Gammon, PharmD 05/15/2019 7:27 AM PGY-1 Pharmacy Resident Direct Phone: [phone] Please check [email or web address] for unit-specific pharmacist phone numbers

## 2019-05-15 NOTE — Progress Notes (Signed)
PROGRESS NOTE    Russell Dillon  IRJ:[id number] DOB: 1946-02-24 DOA: 05/05/2019 PCP: Marton Redwood, MD   Brief Narrative:  Patient is a 73 year old male with past medical history of chronic atrial fibrillation on warfarin, hypertension, hyperlipidemia, diastolic dysfunction with aortic valve regurgitation, history of SBO, BPH, dementia who presents with complaints of abdominal pain.  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  He was recently found to have urinary tract infection and was treated with antibiotics and outpatient.  He was complaining of abdomen pain on presentation.  CT imaging showed small bowel obstruction with transition point .  General surgery consulted. NGT placed on 05/07/2019. No improvement 5 days later. Pt was taken to OR for LOA. He was hypotensive post operatively and was on 4N ICU with neo drip. Currently off pressors.  General surgery following.  On TPN.  No flatus or bowel movement till today.   Assessment & Plan:   Principal Problem:   Sepsis secondary to UTI [facility]) Active Problems:   Atrial fibrillation, chronic   Long term (current) use of anticoagulants   SBO (small bowel obstruction) (HCC)   Dementia without behavioral disturbance (HCC)   AKI (acute kidney injury) ([place])   Transient hypotension   SBO:CT imaging showed small bowel obstruction with transition point .  General surgery consulted. NGT placed on 05/07/2019. No improvement 5 days later. Pt was taken to OR for LOA.  POD 4. General surgery following. This morning he denies any abdomen pain.  Denies any flatus or bowel movement.  N.p.o.  On TPN  Hypotension status post surgery: Currently blood pressure stable.  He was put on pressures after surgery because of hypotension.  Suspected sepsis secondary to urinary tract infection: Urine culture showed diphtheroids.  Antibiotics has been discontinued.  Acute kidney injury: Resolved  Permanent A.  fib on anticoagulation:CHA2DS2-VASc score = at least 3.  Currently on heparin drip.  Resume Coumadin when appropriate and able to take by mouth.  History of dementia with behavioral disturbance: On Aricept at home.  Debility/deconditioning: Has been using wheelchair since patella rupture.  Will request for PT/OT evaluation when appropriate.    Nutrition Problem: Inadequate oral intake Etiology: altered GI function(SBO)      DVT prophylaxis: Heparin IV Code Status: Full Family Communication: Discussed with patient Disposition Plan: Awaiting bowel function   Consultants: General surgery  Procedures: Exploratory laparotomy with lysis of adhesions  Antimicrobials:  Anti-infectives (From admission, onward)   Start     Dose/Rate Route Frequency Ordered Stop   05/06/19 2100  vancomycin (VANCOCIN) 1,250 mg in sodium chloride 0.9 % 250 mL IVPB  Status:  Discontinued     1,250 mg 166.7 mL/hr over 90 Minutes Intravenous Every 24 hours 05/05/19 1849 05/05/19 2057   05/06/19 0800  ceFEPIme (MAXIPIME) 2 g in sodium chloride 0.9 % 100 mL IVPB  Status:  Discontinued     2 g 200 mL/hr over 30 Minutes Intravenous Every 12 hours 05/05/19 1849 05/05/19 2057   05/06/19 0800  ceFEPIme (MAXIPIME) 2 g in sodium chloride 0.9 % 100 mL IVPB  Status:  Discontinued     2 g 200 mL/hr over 30 Minutes Intravenous Every 12 hours 05/05/19 2114 05/11/19 1257   05/05/19 1830  ceFEPIme (MAXIPIME) 2 g in sodium chloride 0.9 % 100 mL IVPB     2 g 200 mL/hr over 30 Minutes Intravenous  Once 05/05/19 1817 05/05/19 2019   05/05/19 1830  metroNIDAZOLE (FLAGYL) IVPB  500 mg     500 mg 100 mL/hr over 60 Minutes Intravenous  Once 05/05/19 1817 05/05/19 2011   05/05/19 1830  vancomycin (VANCOCIN) IVPB 1000 mg/200 mL premix  Status:  Discontinued     1,000 mg 200 mL/hr over 60 Minutes Intravenous  Once 05/05/19 1817 05/05/19 1825   05/05/19 1830  vancomycin (VANCOCIN) 1,500 mg in sodium chloride 0.9 % 500 mL IVPB      1,500 mg 250 mL/hr over 120 Minutes Intravenous  Once 05/05/19 1825 05/05/19 2238      Subjective:  Patient seen and examined the bedside this morning.  Remains comfortable.  Hemodynamically stable.  No bowel movement or flatus yet.  Denies any abdominal pain.  Objective: Vitals:   05/15/19 0606 05/15/19 0924 05/15/19 1243 05/15/19 1333  BP: 101/68 105/67 104/71 112/61  Pulse: 84 97 94 (!) 101  Resp: 13 18 14    Temp:  98 F (36.7 C) 98.2 F (36.8 C)   TempSrc:  Oral Oral   SpO2: 100% 100% 100%   Weight:      Height:        Intake/Output Summary (Last 24 hours) at 05/15/2019 1336 Last data filed at 05/15/2019 1100 Gross per 24 hour  Intake 990.35 ml  Output 2480 ml  Net -1489.65 ml   Filed Weights   05/13/19 0500 05/14/19 0500 05/15/19 0500  Weight: 79.1 kg 78.3 kg 78.8 kg    Examination:   General exam: Weak,debilitated HEENT:NG tube Respiratory system: Bilateral equal air entry, normal vesicular breath sounds, no wheezes or crackles  Cardiovascular system: S1 & S2 heard, RRR. No JVD, murmurs, rubs, gallops or clicks. Gastrointestinal system: Abdomen is nondistended, soft .abdomen covered with dressing.  No bowel sounds heard  Central nervous system: Alert and oriented. No focal neurological deficits. Extremities: No edema, no clubbing ,no cyanosis, distal peripheral pulses palpable. Skin: No rashes, lesions or ulcers,no icterus ,no pallor    Data Reviewed: I have personally reviewed following labs and imaging studies  CBC: Recent Labs  Lab 05/10/19 0337 05/11/19 1516 05/12/19 0615 05/13/19 0706 05/14/19 0224 05/15/19 0849  WBC 8.9  --  9.3 19.1* 11.3* 8.4  NEUTROABS  --   --  8.3*  --  9.5*  --   HGB 10.8* 11.2* 9.3* 10.0* 10.1* 10.5*  HCT 35.2* 33.0* 30.2* 32.6* 32.3* 33.7*  MCV 83.0  --  83.2 83.6 82.6 83.4  PLT 303  --  258 260 257 283   Basic Metabolic Panel: Recent Labs  Lab 05/11/19 0430 05/11/19 1516 05/12/19 0615 05/13/19 0706 05/14/19  0224 05/15/19 0849  NA 147* 148* 145 143 142 139  K 3.7 3.5 3.7 3.5 3.4* 4.6  CL 111  --  109 107 108 108  CO2 24  --  22 25 25 23   GLUCOSE 92  --  154* 184* 158* 133*  BUN 23  --  18 17 18  29*  CREATININE 0.90  --  0.98 0.84 0.70 0.74  CALCIUM 9.8  --  8.7* 8.7* 8.7* 9.0  MG  --   --  1.6* 2.0 1.8 1.8  PHOS  --   --  2.5 1.8* 2.4* 3.2   GFR: Estimated Creatinine Clearance: 88.9 mL/min (by C-G formula based on SCr of 0.74 mg/dL). Liver Function Tests: Recent Labs  Lab 05/12/19 0615 05/13/19 0706  AST 17 24  ALT 17 23  ALKPHOS 62 60  BILITOT 1.6* 1.0  PROT 6.6 6.5  ALBUMIN 3.1* 2.9*  No results for input(s): LIPASE, AMYLASE in the last 168 hours. No results for input(s): AMMONIA in the last 168 hours. Coagulation Profile: Recent Labs  Lab 05/08/19 1624 05/09/19 0609 05/09/19 1444 05/10/19 0337 05/15/19 0849  INR 2.0* 1.9* 1.3* 1.3* 1.1   Cardiac Enzymes: No results for input(s): CKTOTAL, CKMB, CKMBINDEX, TROPONINI in the last 168 hours. BNP (last 3 results) No results for input(s): PROBNP in the last 8760 hours. HbA1C: Recent Labs    05/14/19 0401  HGBA1C 5.6   CBG: Recent Labs  Lab 05/14/19 2359 05/15/19 0603 05/15/19 0812 05/15/19 1140 05/15/19 1146  GLUCAP 131* 119* 133* 121* 128*   Lipid Profile: No results for input(s): CHOL, HDL, LDLCALC, TRIG, CHOLHDL, LDLDIRECT in the last 72 hours. Thyroid Function Tests: No results for input(s): TSH, T4TOTAL, FREET4, T3FREE, THYROIDAB in the last 72 hours. Anemia Panel: No results for input(s): VITAMINB12, FOLATE, FERRITIN, TIBC, IRON, RETICCTPCT in the last 72 hours. Sepsis Labs: Recent Labs  Lab 05/13/19 0915 05/14/19 0224 05/15/19 0849  PROCALCITON 0.22 0.16 <0.10    Recent Results (from the past 240 hour(s))  Blood Culture (routine x 2)     Status: None   Collection Time: 05/05/19  6:39 PM   Specimen: BLOOD  Result Value Ref Range Status   Specimen Description BLOOD RIGHT ANTECUBITAL  Final    Special Requests   Final    BOTTLES DRAWN AEROBIC AND ANAEROBIC Blood Culture results may not be optimal due to an inadequate volume of blood received in culture bottles   Culture   Final    NO GROWTH 5 DAYS Performed at [facility], [place] [address], [place], [place] [address]    Report Status 05/10/2019 FINAL  Final  Blood Culture (routine x 2)     Status: None   Collection Time: 05/05/19  6:44 PM   Specimen: BLOOD LEFT FOREARM  Result Value Ref Range Status   Specimen Description BLOOD LEFT FOREARM  Final   Special Requests   Final    BOTTLES DRAWN AEROBIC ONLY Blood Culture adequate volume   Culture   Final    NO GROWTH 5 DAYS Performed at [facility], [place] [address], [place], [place] [address]    Report Status 05/10/2019 FINAL  Final  Urine culture     Status: Abnormal   Collection Time: 05/05/19  6:55 PM   Specimen: Urine, Random  Result Value Ref Range Status   Specimen Description URINE, RANDOM  Final   Special Requests NONE  Final   Culture (A)  Final    80,000 COLONIES/mL DIPHTHEROIDS(CORYNEBACTERIUM SPECIES) Standardized susceptibility testing for this organism is not available. Performed at [facility], [place] [address] [place], [place] [address]    Report Status 05/06/2019 FINAL  Final  SARS Coronavirus 2 (CEPHEID - Performed in [place] hospital lab), Hosp Order     Status: None   Collection Time: 05/05/19  7:13 PM   Specimen: Nasopharyngeal Swab  Result Value Ref Range Status   SARS Coronavirus 2 NEGATIVE NEGATIVE Final    Comment: (NOTE) If result is NEGATIVE SARS-CoV-2 target nucleic acids are NOT DETECTED. The SARS-CoV-2 RNA is generally detectable in upper and lower  respiratory specimens during the acute phase of infection. The lowest  concentration of SARS-CoV-2 viral copies this assay can detect is 250  copies / mL. A negative result does not preclude SARS-CoV-2 infection  and should not be used as the sole basis for treatment  or other  patient management decisions.  A negative result may occur with  improper specimen collection / handling, submission of specimen other  than nasopharyngeal swab, presence of viral mutation(s) within the  areas targeted by this assay, and inadequate number of viral copies  (<250 copies / mL). A negative result must be combined with clinical  observations, patient history, and epidemiological information. If result is POSITIVE SARS-CoV-2 target nucleic acids are DETECTED. The SARS-CoV-2 RNA is generally detectable in upper and lower  respiratory specimens dur ing the acute phase of infection.  Positive  results are indicative of active infection with SARS-CoV-2.  Clinical  correlation with patient history and other diagnostic information is  necessary to determine patient infection status.  Positive results do  not rule out bacterial infection or co-infection with other viruses. If result is PRESUMPTIVE POSTIVE SARS-CoV-2 nucleic acids MAY BE PRESENT.   A presumptive positive result was obtained on the submitted specimen  and confirmed on repeat testing.  While 2019 novel coronavirus  (SARS-CoV-2) nucleic acids may be present in the submitted sample  additional confirmatory testing may be necessary for epidemiological  and / or clinical management purposes  to differentiate between  SARS-CoV-2 and other Sarbecovirus currently known to infect humans.  If clinically indicated additional testing with an alternate test  methodology [phone]) is advised. The SARS-CoV-2 RNA is generally  detectable in upper and lower respiratory sp ecimens during the acute  phase of infection. The expected result is Negative. Fact Sheet for Patients:  StrictlyIdeas.no Fact Sheet for Healthcare Providers: BankingDealers.co.za This test is not yet approved or cleared by the [place] FDA and has been authorized for detection and/or diagnosis of  SARS-CoV-2 by FDA under an Emergency Use Authorization (EUA).  This EUA will remain in effect (meaning this test can be used) for the duration of the COVID-19 declaration under Section 564(b)(1) of the Act, 21 U.S.C. section 360bbb-3(b)(1), unless the authorization is terminated or revoked sooner. Performed at [facility], [place] [address], [place], [place] [address]   MRSA PCR Screening     Status: None   Collection Time: 05/11/19  6:41 PM   Specimen: Nasopharyngeal  Result Value Ref Range Status   MRSA by PCR NEGATIVE NEGATIVE Final    Comment:        The GeneXpert MRSA Assay (FDA approved for NASAL specimens only), is one component of a comprehensive MRSA colonization surveillance program. It is not intended to diagnose MRSA infection nor to guide or monitor treatment for MRSA infections. Performed at [facility], [place] [address], [place], [place] [address]   Culture, blood (Routine X 2) w Reflex to ID Panel     Status: None (Preliminary result)   Collection Time: 05/13/19 10:15 AM   Specimen: BLOOD LEFT ARM  Result Value Ref Range Status   Specimen Description BLOOD LEFT ARM  Final   Special Requests   Final    BOTTLES DRAWN AEROBIC ONLY Blood Culture adequate volume   Culture   Final    NO GROWTH < 24 HOURS Performed at [facility], [place] [address] [place],  [address]    Report Status PENDING  Incomplete  Culture, blood (Routine X 2) w Reflex to ID Panel     Status: None (Preliminary result)   Collection Time: 05/13/19 10:15 AM   Specimen: BLOOD LEFT ARM  Result Value Ref Range Status   Specimen Description BLOOD LEFT ARM  Final   Special  Requests   Final    BOTTLES DRAWN AEROBIC ONLY Blood Culture adequate volume   Culture   Final    NO GROWTH < 24 HOURS Performed at [facility], [place] [address], [place], [place] [address]    Report Status PENDING  Incomplete         Radiology Studies: No results found.      Scheduled  Meds: . sodium chloride   Intravenous Once  . Chlorhexidine Gluconate Cloth  6 each Topical Daily  . Chlorhexidine Gluconate Cloth  6 each Topical Q0600  . diltiazem  25 mg Intravenous Q6H  . insulin aspart  0-9 Units Subcutaneous Q6H  . mouth rinse  15 mL Mouth Rinse BID  . sodium chloride flush  10-40 mL Intracatheter Q12H  . sodium chloride flush  3 mL Intravenous Q12H   Continuous Infusions: . heparin 1,450 Units/hr (05/15/19 1245)  . lactated ringers 10 mL/hr at 05/11/19 1306  . methocarbamol (ROBAXIN) IV    . TPN ADULT (ION) 80 mL/hr at 05/14/19 1727  . TPN ADULT (ION)       LOS: 10 days    Time spent:35 mins. More than 50% of that time was spent in counseling and/or coordination of care.      Shelly Coss, MD [facility] Pager [phone]  If 7PM-7AM, please contact night-coverage [email or web address] Password [email or web address] 05/15/2019, 1:36 PM

## 2019-05-16 LAB — CBC
HCT: 35.6 % — ABNORMAL LOW (ref 39.0–52.0)
Hemoglobin: 10.9 g/dL — ABNORMAL LOW (ref 13.0–17.0)
MCH: 25.7 pg — ABNORMAL LOW (ref 26.0–34.0)
MCHC: 30.6 g/dL (ref 30.0–36.0)
MCV: 84 fL (ref 80.0–100.0)
Platelets: 220 10*3/uL (ref 150–400)
RBC: 4.24 MIL/uL (ref 4.22–5.81)
RDW: 20.8 % — ABNORMAL HIGH (ref 11.5–15.5)
WBC: 9 10*3/uL (ref 4.0–10.5)
nRBC: 0 % (ref 0.0–0.2)

## 2019-05-16 LAB — GLUCOSE, CAPILLARY
Glucose-Capillary: 120 mg/dL — ABNORMAL HIGH (ref 70–99)
Glucose-Capillary: 120 mg/dL — ABNORMAL HIGH (ref 70–99)
Glucose-Capillary: 118 mg/dL — ABNORMAL HIGH (ref 70–99)
Glucose-Capillary: 100 mg/dL — ABNORMAL HIGH (ref 70–99)
Glucose-Capillary: 113 mg/dL — ABNORMAL HIGH (ref 70–99)

## 2019-05-16 LAB — HEPARIN LEVEL (UNFRACTIONATED): Heparin Unfractionated: 0.53 IU/mL (ref 0.30–0.70)

## 2019-05-16 MED ORDER — TRAVASOL 10 % IV SOLN
INTRAVENOUS | Status: AC
  Administered 2019-05-16: 18:00:00 via INTRAVENOUS
  Filled 2019-05-16: qty 1200

## 2019-05-16 NOTE — Progress Notes (Signed)
ANTICOAGULATION CONSULT NOTE  Pharmacy Consult for heparin Indication: atrial fibrillation  Patient Measurements: Height: 6' 0.01" (182.9 cm) Weight: 173 lb 11.6 oz (78.8 kg) IBW/kg (Calculated) : 77.62 Heparin Dosing Weight: 81 kg  Vital Signs:    Labs: Recent Labs    05/14/19 0224 05/15/19 0849 05/16/19 0505  HGB 10.1* 10.5* 10.9*  HCT 32.3* 33.7* 35.6*  PLT 257 221 220  LABPROT  --  14.2  --   INR  --  1.1  --   HEPARINUNFRC 0.32 0.30 0.53  CREATININE 0.70 0.74  --      Medical History: Past Medical History:  Diagnosis Date  . Aortic valve regurgitation 10/16/2018   Echo 07/28/2017:  EF 60-65, moderate AI, aortic root and ascending aorta mildly dilated (40 mm), ascending aorta 43 mm, MAC, mild MR, mild LAE, moderate RV enlargement, trivial TR // Echo 12/19: EF 60-65, normal wall motion, mild AI, moderate BAE, ascending aorta 43 mm, aortic root 39 mm  . BPH (benign prostatic hypertrophy)   . Chronic anticoagulation   . Chronic atrial fibrillation   . Diastolic dysfunction October 2010   Normal LV systolic function  . Hx SBO   . Hyperlipidemia   . Hypertension   . Patellar tendon rupture, left, initial encounter 02/10/2019  . Small bowel obstruction [facility])      Assessment: 73 yo male admitted with abdominal pain. Had a small bowel resection 05/12/19. He is on warfarin prior to admission for AFib. He is currently in AFib. Warfarin has been held perioperatively; Heparin was restarted 05/12/19 s/p surgery.   Heparin level therapeutic today at 0.53 with slight increase yesterday CBC stable at hgb 10.9, hct 35.6, platelet count 220.  No signs or symptoms of bleeding. Baseline INR yesterday @ 1.1.   Goal of Therapy:  Heparin level 0.3-0.7 units/ml Monitor platelets by anticoagulation protocol: Yes    Plan:  - Continue heparin to 1450 units / hr  - Daily HL, CBC - follow-up warfarin restart plans per surgery / TRH  Thank you for the interesting consult and for  involving pharmacy in this patient's care.  Tamela Gammon, PharmD 05/16/2019 7:29 AM PGY-1 Pharmacy Resident Direct Phone: [phone] Please check [email or web address] for unit-specific pharmacist phone numbers

## 2019-05-16 NOTE — Progress Notes (Signed)
PHARMACY - ADULT TOTAL PARENTERAL NUTRITION CONSULT NOTE   Pharmacy Consult for TPN Indication: prolonged ileus  Patient Measurements: Height: 6' 0.01" (182.9 cm) Weight: 173 lb 11.6 oz (78.8 kg) IBW/kg (Calculated) : 77.62 TPN AdjBW (KG): 80.1 Body mass index is 23.56 kg/m.   Assessment:  Admitted with abdominal pain. Patient has history of prior small bowel resection and appendectomy in 2004. CT on admission shows small bowel obstruction. He was first started on conservative management. Placement of NG tube was initially held due therapeutic INR of 3. On 6/26 warfarin was reversed and NG was placed for decompression. After a few days of no improvement, patient went to OR for small bowel resection with resultant lysis of adhesions.  GI: s/p small bowel resection 05/11/19. Pre-albumin 15.1. Was NPO for 8 days. Emesis 50 mL. No BM.  Endo: CBGs <150 Insulin requirements in the past 24 hours: 1 unit Lytes: K and Phos now wnl. Mg 1.8. CoCa 9.9.  Renal: SCr 0.74. BUN 18>>29. UOP 1.3 mL/kg/hr.  Pulm: RA Cards: Currently in AFib, on warfarin PTA, Heparin IV here. Off Diltiazem drip.  Hepatobil: LFTs wnl Tbili 1. TG 51 Neuro: dementia at baseline, on Aricept ID: Completed empiric course of Cefepime. WBC trending down. Afebrile.   TPN Access: PICC placed 7/1 TPN start date: 05/12/19  Nutritional Goals (per RD recommendation on 05/13/19):   KCal: 2200-2400 Protein: 105-120 g Fluid: > 2.2 L  Goal TPN rate with lipids: 80 mL/hr (this provides 109 g of protein, 345 g of dextrose, and 61 g of lipids which provides 2227 kCals per day, meeting 100% of patient needs)  Current Nutrition:   Plan:  -Continue TPN at goal rate of 80 mL/hr - due to a temporary shortage of 30% lipids, lipids will be removed from the TPN on Sunday and Monday this week; will restart lipids on Tuesday (7/7) -This TPN provides 120 g of protein and 345 g of dextrose which provides 1653 kCals per day, meeting 100% of patient  protein needs and 75% of Kcal needs -Electrolytes in TPN: Continue increased Phos and K, increase Mg, others to standard, MM:HWKGSUP 1:1 -Add MVI, trace elements Mon/Wed/Fri to TPN - Monitor CBGs with increased TPN rate   Alanda Slim, PharmD, [facility] Clinical Pharmacist Please see AMION for all Pharmacists' Contact Phone Numbers 05/16/2019, 7:39 AM

## 2019-05-16 NOTE — Progress Notes (Signed)
5 Days Post-Op   Subjective/Chief Complaint: Still requiring restraints Denies abdominal pain Minimal now out of NG   Objective: Vital signs in last 24 hours: Temp:  [97.8 F (36.6 C)-98.2 F (36.8 C)] 97.8 F (36.6 C) (07/05 0950) Pulse Rate:  [93-101] 93 (07/04 1736) Resp:  [12-16] 16 (07/05 0950) BP: (104-121)/(61-77) 114/62 (07/05 0950) SpO2:  [97 %-100 %] 97 % (07/05 0950) Last BM Date: 05/06/19  Intake/Output from previous day: 07/04 0701 - 07/05 0700 In: 3 [I.V.:3] Out: 2550 [Urine:2500; Emesis/NG output:50] Intake/Output this shift: No intake/output data recorded.  Exam: Awake and alert Abdomen soft, wound clean Non-distended  Lab Results:  Recent Labs    05/15/19 0849 05/16/19 0505  WBC 8.4 9.0  HGB 10.5* 10.9*  HCT 33.7* 35.6*  PLT 221 220   BMET Recent Labs    05/14/19 0224 05/15/19 0849  NA 142 139  K 3.4* 4.6  CL 108 108  CO2 25 23  GLUCOSE 158* 133*  BUN 18 29*  CREATININE 0.70 0.74  CALCIUM 8.7* 9.0   PT/INR Recent Labs    05/15/19 0849  LABPROT 14.2  INR 1.1   ABG No results for input(s): PHART, HCO3 in the last 72 hours.  Invalid input(s): PCO2, PO2  Studies/Results: No results found.  Anti-infectives: Anti-infectives (From admission, onward)   Start     Dose/Rate Route Frequency Ordered Stop   05/06/19 2100  vancomycin (VANCOCIN) 1,250 mg in sodium chloride 0.9 % 250 mL IVPB  Status:  Discontinued     1,250 mg 166.7 mL/hr over 90 Minutes Intravenous Every 24 hours 05/05/19 1849 05/05/19 2057   05/06/19 0800  ceFEPIme (MAXIPIME) 2 g in sodium chloride 0.9 % 100 mL IVPB  Status:  Discontinued     2 g 200 mL/hr over 30 Minutes Intravenous Every 12 hours 05/05/19 1849 05/05/19 2057   05/06/19 0800  ceFEPIme (MAXIPIME) 2 g in sodium chloride 0.9 % 100 mL IVPB  Status:  Discontinued     2 g 200 mL/hr over 30 Minutes Intravenous Every 12 hours 05/05/19 2114 05/11/19 1257   05/05/19 1830  ceFEPIme (MAXIPIME) 2 g in sodium  chloride 0.9 % 100 mL IVPB     2 g 200 mL/hr over 30 Minutes Intravenous  Once 05/05/19 1817 05/05/19 2019   05/05/19 1830  metroNIDAZOLE (FLAGYL) IVPB 500 mg     500 mg 100 mL/hr over 60 Minutes Intravenous  Once 05/05/19 1817 05/05/19 2011   05/05/19 1830  vancomycin (VANCOCIN) IVPB 1000 mg/200 mL premix  Status:  Discontinued     1,000 mg 200 mL/hr over 60 Minutes Intravenous  Once 05/05/19 1817 05/05/19 1825   05/05/19 1830  vancomycin (VANCOCIN) 1,500 mg in sodium chloride 0.9 % 500 mL IVPB     1,500 mg 250 mL/hr over 120 Minutes Intravenous  Once 05/05/19 1825 05/05/19 2238      Assessment/Plan: s/p Procedure(s): EXPLORATORY LAPAROTOMY (N/A) LYSIS OF ADHESION (N/A)  Will d/c NG Continue wound care Keep on ice chips only today   LOS: 11 days    Coralie Keens 05/16/2019

## 2019-05-16 NOTE — Progress Notes (Signed)
NGT removed per MD orders. No output noted since this shift. Pt tolerated well. Abd wound changed and inspected by MD. Old dressing was clean, dry, and intact.

## 2019-05-16 NOTE — Progress Notes (Signed)
PROGRESS NOTE    BRAXTEN Dillon  UEA:[id number] DOB: 08/30/1946 DOA: 05/05/2019 PCP: Russell Redwood, MD   Brief Narrative:  Patient is a 73 year old male with past medical history of chronic atrial fibrillation on warfarin, hypertension, hyperlipidemia, diastolic dysfunction with aortic valve regurgitation, history of SBO, BPH, dementia who presents with complaints of abdominal pain.  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  He was recently found to have urinary tract infection and was treated with antibiotics and outpatient. CT imaging showed small bowel obstruction with transition point .  General surgery consulted. NGT placed on 05/07/2019. No improvement 5 days later. Pt was taken to OR for LOA. He was hypotensive post operatively and was on 4N ICU with neo drip. Currently off pressors.  General surgery following.  On TPN.  No flatus or bowel movement till today.   Assessment & Plan:   Principal Problem:   Sepsis secondary to UTI [facility]) Active Problems:   Atrial fibrillation, chronic   Long term (current) use of anticoagulants   SBO (small bowel obstruction) (HCC)   Dementia without behavioral disturbance (HCC)   AKI (acute kidney injury) ([place])   Transient hypotension   SBO:CT imaging showed small bowel obstruction with transition point .  General surgery consulted. NGT placed on 05/07/2019. No improvement 5 days later. Pt was taken to OR for LOA.  POD 5. General surgery following. This morning he denies any abdomen pain.  Denies any flatus or bowel movement.  N.p.o. except ice chips. On TPN  Hypotension status post surgery: Currently blood pressure stable.  He was put on pressures after surgery because of hypotension.  Suspected sepsis secondary to urinary tract infection: Urine culture showed diphtheroids.  Antibiotics has been discontinued.  Acute kidney injury: Resolved  Permanent A. fib on anticoagulation:CHA2DS2-VASc  score = at least 3.  Currently on heparin drip.  Resume Coumadin when appropriate and able to take by mouth.  History of dementia with behavioral disturbance: On Aricept at home.  Debility/deconditioning: Has been using wheelchair since patella rupture.  Will request for PT/OT evaluation when appropriate.    Nutrition Problem: Inadequate oral intake Etiology: altered GI function(SBO)      DVT prophylaxis: Heparin IV Code Status: Full Family Communication: Discussed with patient Disposition Plan: Awaiting bowel function   Consultants: General surgery  Procedures: Exploratory laparotomy with lysis of adhesions  Antimicrobials:  Anti-infectives (From admission, onward)   Start     Dose/Rate Route Frequency Ordered Stop   05/06/19 2100  vancomycin (VANCOCIN) 1,250 mg in sodium chloride 0.9 % 250 mL IVPB  Status:  Discontinued     1,250 mg 166.7 mL/hr over 90 Minutes Intravenous Every 24 hours 05/05/19 1849 05/05/19 2057   05/06/19 0800  ceFEPIme (MAXIPIME) 2 g in sodium chloride 0.9 % 100 mL IVPB  Status:  Discontinued     2 g 200 mL/hr over 30 Minutes Intravenous Every 12 hours 05/05/19 1849 05/05/19 2057   05/06/19 0800  ceFEPIme (MAXIPIME) 2 g in sodium chloride 0.9 % 100 mL IVPB  Status:  Discontinued     2 g 200 mL/hr over 30 Minutes Intravenous Every 12 hours 05/05/19 2114 05/11/19 1257   05/05/19 1830  ceFEPIme (MAXIPIME) 2 g in sodium chloride 0.9 % 100 mL IVPB     2 g 200 mL/hr over 30 Minutes Intravenous  Once 05/05/19 1817 05/05/19 2019   05/05/19 1830  metroNIDAZOLE (FLAGYL) IVPB 500 mg     500 mg  100 mL/hr over 60 Minutes Intravenous  Once 05/05/19 1817 05/05/19 2011   05/05/19 1830  vancomycin (VANCOCIN) IVPB 1000 mg/200 mL premix  Status:  Discontinued     1,000 mg 200 mL/hr over 60 Minutes Intravenous  Once 05/05/19 1817 05/05/19 1825   05/05/19 1830  vancomycin (VANCOCIN) 1,500 mg in sodium chloride 0.9 % 500 mL IVPB     1,500 mg 250 mL/hr over 120 Minutes  Intravenous  Once 05/05/19 1825 05/05/19 2238      Subjective:  Patient seen and examined at bedside this morning.  No bowel movement or flatus.Very eager to take food.Denies any abdominal pain.  Objective: Vitals:   05/15/19 1243 05/15/19 1333 05/15/19 1736 05/16/19 0950  BP: 104/71 112/61 121/77 114/62  Pulse: 94 (!) 101 93   Resp: 14  12 16   Temp: 98.2 F (36.8 C)  97.9 F (36.6 C) 97.8 F (36.6 C)  TempSrc: Oral  Oral Oral  SpO2: 100%  100% 97%  Weight:      Height:        Intake/Output Summary (Last 24 hours) at 05/16/2019 1356 Last data filed at 05/16/2019 0424 Gross per 24 hour  Intake 3 ml  Output 1650 ml  Net -1647 ml   Filed Weights   05/13/19 0500 05/14/19 0500 05/15/19 0500  Weight: 79.1 kg 78.3 kg 78.8 kg    Examination:   General exam: weak,debilitated HEENT:NG tube  Respiratory system: Bilateral equal air entry, normal vesicular breath sounds, no wheezes or crackles  Cardiovascular system: S1 & S2 heard, RRR. No JVD, murmurs, rubs, gallops or clicks. Gastrointestinal system: Abdomen is nondistended, soft and nontender. No organomegaly or masses felt. No  bowel sounds heard.Surgical scar covered with dressing. Central nervous system: Alert and oriented. No focal neurological deficits. Extremities: No edema, no clubbing ,no cyanosis, distal peripheral pulses palpable. Skin: No rashes, lesions or ulcers,no icterus ,no pallor    Data Reviewed: I have personally reviewed following labs and imaging studies  CBC: Recent Labs  Lab 05/12/19 0615 05/13/19 0706 05/14/19 0224 05/15/19 0849 05/16/19 0505  WBC 9.3 19.1* 11.3* 8.4 9.0  NEUTROABS 8.3*  --  9.5*  --   --   HGB 9.3* 10.0* 10.1* 10.5* 10.9*  HCT 30.2* 32.6* 32.3* 33.7* 35.6*  MCV 83.2 83.6 82.6 83.4 84.0  PLT 258 260 257 221 161   Basic Metabolic Panel: Recent Labs  Lab 05/11/19 0430 05/11/19 1516 05/12/19 0615 05/13/19 0706 05/14/19 0224 05/15/19 0849  NA 147* 148* 145 143 142 139   K 3.7 3.5 3.7 3.5 3.4* 4.6  CL 111  --  109 107 108 108  CO2 24  --  22 25 25 23   GLUCOSE 92  --  154* 184* 158* 133*  BUN 23  --  18 17 18  29*  CREATININE 0.90  --  0.98 0.84 0.70 0.74  CALCIUM 9.8  --  8.7* 8.7* 8.7* 9.0  MG  --   --  1.6* 2.0 1.8 1.8  PHOS  --   --  2.5 1.8* 2.4* 3.2   GFR: Estimated Creatinine Clearance: 88.9 mL/min (by C-G formula based on SCr of 0.74 mg/dL). Liver Function Tests: Recent Labs  Lab 05/12/19 0615 05/13/19 0706  AST 17 24  ALT 17 23  ALKPHOS 62 60  BILITOT 1.6* 1.0  PROT 6.6 6.5  ALBUMIN 3.1* 2.9*   No results for input(s): LIPASE, AMYLASE in the last 168 hours. No results for input(s): AMMONIA in the last  168 hours. Coagulation Profile: Recent Labs  Lab 05/09/19 1444 05/10/19 0337 05/15/19 0849  INR 1.3* 1.3* 1.1   Cardiac Enzymes: No results for input(s): CKTOTAL, CKMB, CKMBINDEX, TROPONINI in the last 168 hours. BNP (last 3 results) No results for input(s): PROBNP in the last 8760 hours. HbA1C: Recent Labs    05/14/19 0401  HGBA1C 5.6   CBG: Recent Labs  Lab 05/15/19 1146 05/15/19 1629 05/16/19 0102 05/16/19 0623 05/16/19 1243  GLUCAP 128* 118* 120* 120* 118*   Lipid Profile: No results for input(s): CHOL, HDL, LDLCALC, TRIG, CHOLHDL, LDLDIRECT in the last 72 hours. Thyroid Function Tests: No results for input(s): TSH, T4TOTAL, FREET4, T3FREE, THYROIDAB in the last 72 hours. Anemia Panel: No results for input(s): VITAMINB12, FOLATE, FERRITIN, TIBC, IRON, RETICCTPCT in the last 72 hours. Sepsis Labs: Recent Labs  Lab 05/13/19 0915 05/14/19 0224 05/15/19 0849  PROCALCITON 0.22 0.16 <0.10    Recent Results (from the past 240 hour(s))  MRSA PCR Screening     Status: None   Collection Time: 05/11/19  6:41 PM   Specimen: Nasopharyngeal  Result Value Ref Range Status   MRSA by PCR NEGATIVE NEGATIVE Final    Comment:        The GeneXpert MRSA Assay (FDA approved for NASAL specimens only), is one component of  a comprehensive MRSA colonization surveillance program. It is not intended to diagnose MRSA infection nor to guide or monitor treatment for MRSA infections. Performed at [facility], [place] [address], [place], [place] [address]   Culture, blood (Routine X 2) w Reflex to ID Panel     Status: None (Preliminary result)   Collection Time: 05/13/19 10:15 AM   Specimen: BLOOD LEFT ARM  Result Value Ref Range Status   Specimen Description BLOOD LEFT ARM  Final   Special Requests   Final    BOTTLES DRAWN AEROBIC ONLY Blood Culture adequate volume   Culture   Final    NO GROWTH 2 DAYS Performed at [facility], [place] [address], [place], [place] [address]    Report Status PENDING  Incomplete  Culture, blood (Routine X 2) w Reflex to ID Panel     Status: None (Preliminary result)   Collection Time: 05/13/19 10:15 AM   Specimen: BLOOD LEFT ARM  Result Value Ref Range Status   Specimen Description BLOOD LEFT ARM  Final   Special Requests   Final    BOTTLES DRAWN AEROBIC ONLY Blood Culture adequate volume   Culture   Final    NO GROWTH 2 DAYS Performed at [facility], [place] [address] [place], [place] [address]    Report Status PENDING  Incomplete         Radiology Studies: No results found.      Scheduled Meds: . sodium chloride   Intravenous Once  . Chlorhexidine Gluconate Cloth  6 each Topical Daily  . Chlorhexidine Gluconate Cloth  6 each Topical Q0600  . diltiazem  25 mg Intravenous Q6H  . insulin aspart  0-9 Units Subcutaneous Q6H  . mouth rinse  15 mL Mouth Rinse BID  . sodium chloride flush  10-40 mL Intracatheter Q12H  . sodium chloride flush  3 mL Intravenous Q12H   Continuous Infusions: . heparin 1,450 Units/hr (05/15/19 1245)  . lactated ringers 10 mL/hr at 05/11/19 1306  . methocarbamol (ROBAXIN) IV    . TPN ADULT (ION) 80 mL/hr at 05/15/19 1751  . TPN ADULT (ION)  LOS: 11 days    Time spent:35 mins. More than 50% of that time was  spent in counseling and/or coordination of care.      Shelly Coss, MD [facility] Pager [phone]  If 7PM-7AM, please contact night-coverage [email or web address] Password [email or web address] 05/16/2019, 1:56 PM

## 2019-05-16 NOTE — Plan of Care (Signed)
  Problem: Education: Goal: Knowledge of General Education information will improve Description Including pain rating scale, medication(s)/side effects and non-pharmacologic comfort measures Outcome: Progressing   

## 2019-05-17 LAB — CBC
HCT: 37 % — ABNORMAL LOW (ref 39.0–52.0)
Hemoglobin: 11.6 g/dL — ABNORMAL LOW (ref 13.0–17.0)
MCH: 26.3 pg (ref 26.0–34.0)
MCHC: 31.4 g/dL (ref 30.0–36.0)
MCV: 83.9 fL (ref 80.0–100.0)
Platelets: 186 10*3/uL (ref 150–400)
RBC: 4.41 MIL/uL (ref 4.22–5.81)
RDW: 21.1 % — ABNORMAL HIGH (ref 11.5–15.5)
WBC: 8.8 10*3/uL (ref 4.0–10.5)
nRBC: 0 % (ref 0.0–0.2)

## 2019-05-17 LAB — COMPREHENSIVE METABOLIC PANEL
ALT: 121 U/L — ABNORMAL HIGH (ref 0–44)
AST: 63 U/L — ABNORMAL HIGH (ref 15–41)
Albumin: 3 g/dL — ABNORMAL LOW (ref 3.5–5.0)
Alkaline Phosphatase: 100 U/L (ref 38–126)
Anion gap: 8 (ref 5–15)
BUN: 36 mg/dL — ABNORMAL HIGH (ref 8–23)
CO2: 21 mmol/L — ABNORMAL LOW (ref 22–32)
Calcium: 9.4 mg/dL (ref 8.9–10.3)
Chloride: 106 mmol/L (ref 98–111)
Creatinine, Ser: 0.88 mg/dL (ref 0.61–1.24)
GFR calc Af Amer: >60 mL/min (ref >60)
GFR calc non Af Amer: >60 mL/min (ref >60)
Glucose, Bld: 132 mg/dL — ABNORMAL HIGH (ref 70–99)
Potassium: 5.4 mmol/L — ABNORMAL HIGH (ref 3.5–5.1)
Sodium: 135 mmol/L (ref 135–145)
Total Bilirubin: 1 mg/dL (ref 0.3–1.2)
Total Protein: 7.1 g/dL (ref 6.5–8.1)

## 2019-05-17 LAB — DIFFERENTIAL
Abs Immature Granulocytes: 0.23 10*3/uL — ABNORMAL HIGH (ref 0.00–0.07)
Basophils Absolute: 0 10*3/uL (ref 0.0–0.1)
Basophils Relative: 1 %
Eosinophils Absolute: 0.5 10*3/uL (ref 0.0–0.5)
Eosinophils Relative: 6 %
Immature Granulocytes: 3 %
Lymphocytes Relative: 13 %
Lymphs Abs: 1.2 10*3/uL (ref 0.7–4.0)
Monocytes Absolute: 0.7 10*3/uL (ref 0.1–1.0)
Monocytes Relative: 8 %
Neutro Abs: 6.2 10*3/uL (ref 1.7–7.7)
Neutrophils Relative %: 69 %

## 2019-05-17 LAB — GLUCOSE, CAPILLARY
Glucose-Capillary: 108 mg/dL — ABNORMAL HIGH (ref 70–99)
Glucose-Capillary: 118 mg/dL — ABNORMAL HIGH (ref 70–99)
Glucose-Capillary: 126 mg/dL — ABNORMAL HIGH (ref 70–99)
Glucose-Capillary: 132 mg/dL — ABNORMAL HIGH (ref 70–99)

## 2019-05-17 LAB — TRIGLYCERIDES: Triglycerides: 22 mg/dL (ref <150)

## 2019-05-17 LAB — HEPARIN LEVEL (UNFRACTIONATED): Heparin Unfractionated: 0.39 IU/mL (ref 0.30–0.70)

## 2019-05-17 LAB — PREALBUMIN: Prealbumin: 28 mg/dL (ref 18–38)

## 2019-05-17 LAB — PHOSPHORUS: Phosphorus: 4.3 mg/dL (ref 2.5–4.6)

## 2019-05-17 LAB — MAGNESIUM: Magnesium: 2 mg/dL (ref 1.7–2.4)

## 2019-05-17 MED ORDER — LITHIUM CARBONATE 150 MG PO CAPS
150.0000 mg | ORAL_CAPSULE | Freq: Every day | ORAL | Status: DC
  Administered 2019-05-17 – 2019-05-19 (×3): 150 mg via ORAL
  Filled 2019-05-17 (×5): qty 1

## 2019-05-17 MED ORDER — BOOST / RESOURCE BREEZE PO LIQD CUSTOM
1.0000 | Freq: Two times a day (BID) | ORAL | Status: DC
  Administered 2019-05-17 – 2019-05-18 (×2): 1 via ORAL

## 2019-05-17 MED ORDER — TRAVASOL 10 % IV SOLN
INTRAVENOUS | Status: AC
  Administered 2019-05-17: 18:00:00 via INTRAVENOUS
  Filled 2019-05-17: qty 1152

## 2019-05-17 MED ORDER — DONEPEZIL HCL 10 MG PO TABS
10.0000 mg | ORAL_TABLET | Freq: Every day | ORAL | Status: DC
  Administered 2019-05-17 – 2019-05-20 (×4): 10 mg via ORAL
  Filled 2019-05-17 (×4): qty 1

## 2019-05-17 MED ORDER — FINASTERIDE 5 MG PO TABS
5.0000 mg | ORAL_TABLET | Freq: Every morning | ORAL | Status: DC
  Administered 2019-05-18 – 2019-05-20 (×3): 5 mg via ORAL
  Filled 2019-05-17 (×3): qty 1

## 2019-05-17 MED ORDER — TRAVASOL 10 % IV SOLN
INTRAVENOUS | Status: DC
  Filled 2019-05-17: qty 1152

## 2019-05-17 MED ORDER — DILTIAZEM HCL ER COATED BEADS 120 MG PO CP24
120.0000 mg | ORAL_CAPSULE | Freq: Every day | ORAL | Status: DC
  Administered 2019-05-17 – 2019-05-19 (×2): 120 mg via ORAL
  Filled 2019-05-17 (×3): qty 1

## 2019-05-17 NOTE — Progress Notes (Signed)
Physical Therapy Treatment Patient Details Name: Russell Dillon MRN: [id number] DOB: 03/26/1946 Today's Date: 05/17/2019    History of Present Illness Pt is a 73 y/o male admitted secondary to worsening abdominal pain. Pt found to have SBO and is s/p exploratory laparotomy. Pt also with sepsis secondary to UTI. Pt with ICU stay following hypotension. PMH includes a fib, dementia, HTN, and L patellar tendon repair.     PT Comments    Patient seen for mobility progression. Pt's wife present throughout session and assisted with bed mobility and redirecting pt when agitated. Pt requires mod A +2 for lateral scoot to drop arm recliner. Pt's wife reports that pt was using slide board for OOB transfers to w/c so will plan on bringing slide board next session for transfer training. Continue to progress as tolerated.     Follow Up Recommendations  Home health PT;Supervision/Assistance - 24 hour(HHOT, HHaide, HHRN-pt's wife refusing SNF )     Equipment Recommendations  None recommended by PT    Recommendations for Other Services       Precautions / Restrictions Precautions Precautions: Fall Precaution Comments: NGT removed 7/5; L knee is very sensitive/painful Restrictions Weight Bearing Restrictions: No    Mobility  Bed Mobility Overal bed mobility: Needs Assistance Bed Mobility: Rolling Rolling: Max assist   Supine to sit: Mod assist;HOB elevated     General bed mobility comments: hand over hand assist to reach for bed rails and assistance at pelvis to roll side to side for clean up from BM; HOB elevated (per wife they elevate HOB for pt to get to EOB at home) and wife assisted with bringing bilat LE to EOB; pt required assistance initially due to posterior bias however able to maintain balance with min guard   Transfers Overall transfer level: Needs assistance Equipment used: (bed pad) Transfers: Lateral/Scoot Transfers          Lateral/Scoot Transfers: Mod assist;+2  physical assistance General transfer comment: cues for hand placement and sequencing; assist to scoot with bed pad; pt assisting as able; per wife uses a slide board at home  Ambulation/Gait             General Gait Details: pt wife pt has not been standing since surgery on L knee (weeks)   Stairs             Wheelchair Mobility    Modified Rankin (Stroke Patients Only)       Balance Overall balance assessment: Needs assistance Sitting-balance support: Feet supported;Single extremity supported;Bilateral upper extremity supported Sitting balance-Leahy Scale: Poor                                      Cognition Arousal/Alertness: Awake/alert Behavior During Therapy: Flat affect;Agitated Overall Cognitive Status: History of cognitive impairments - at baseline                                 General Comments: Pt with dementia at baseline      Exercises      General Comments General comments (skin integrity, edema, etc.): wife present throughout session      Pertinent Vitals/Pain Pain Assessment: Faces Faces Pain Scale: Hurts little more Pain Location: L knee and abdomen with mobility Pain Descriptors / Indicators: Grimacing;Guarding Pain Intervention(s): Limited activity within patient's tolerance;Monitored during session;Repositioned    Home  Living                      Prior Function            PT Goals (current goals can now be found in the care plan section) Progress towards PT goals: Progressing toward goals    Frequency    Min 3X/week      PT Plan Current plan remains appropriate    Co-evaluation              AM-PAC PT "6 Clicks" Mobility   Outcome Measure  Help needed turning from your back to your side while in a flat bed without using bedrails?: Total Help needed moving from lying on your back to sitting on the side of a flat bed without using bedrails?: A Lot Help needed moving to and  from a bed to a chair (including a wheelchair)?: Total Help needed standing up from a chair using your arms (e.g., wheelchair or bedside chair)?: Total Help needed to walk in hospital room?: Total Help needed climbing 3-5 steps with a railing? : Total 6 Click Score: 7    End of Session   Activity Tolerance: Treatment limited secondary to agitation;Patient tolerated treatment well Patient left: with call bell/phone within reach;in chair;with chair alarm set;with family/visitor present Nurse Communication: Mobility status PT Visit Diagnosis: Muscle weakness (generalized) (M62.81);Difficulty in walking, not elsewhere classified (R26.2);Unsteadiness on feet (R26.81)     Time: 1345-1431 PT Time Calculation (min) (ACUTE ONLY): 46 min  Charges:  $Therapeutic Activity: 38-52 mins                     [name], PTA Acute Rehabilitation Services Pager: [phone] Office: [phone]     [name] 05/17/2019, 4:30 PM

## 2019-05-17 NOTE — Progress Notes (Signed)
[facility] CONSULT NOTE   Pharmacy Consult for TPN Indication: prolonged ileus  Patient Measurements: Height: 6' 0.01" (182.9 cm) Weight: 172 lb 2.9 oz (78.1 kg) IBW/kg (Calculated) : 77.62 TPN AdjBW (KG): 80.1 Body mass index is 23.35 kg/m.   Assessment:  Admitted with abdominal pain. Patient has history of prior small bowel resection and appendectomy in 2004. CT on admission shows small bowel obstruction. He was first started on conservative management. Placement of NG tube was initially held due therapeutic INR of 3. On 6/26 warfarin was reversed and NG was placed for decompression. After a few days of no improvement, patient went to OR for small bowel resection with resultant lysis of adhesions.  GI: s/p small bowel resection 05/11/19. Pre-albumin 15.1 > 28. Was NPO for 8 days prior to TPN. Emesis 50 mL. No BM.  Endo: CBGs <150 Insulin requirements in the past 24 hours: 0 unit Lytes: K 5.4, other lytes ok Renal: SCr 0.88. BUN 18>>29. UOP 1.3 mL/kg/hr.  Pulm: RA Cards: Currently in AFib, on warfarin PTA, Heparin IV here. Off Diltiazem drip.  Hepatobil: LFTs up AST 17 > 63, ALT 17 > 121, Tbili wnl, TG 22 Neuro: dementia at baseline, on Aricept ID: Completed empiric course of Cefepime. WBC trending down. Afebrile. Procalcitonin neg  TPN Access: PICC placed 7/1 TPN start date: 05/12/19  Nutritional Goals (per RD recommendation on 05/13/19):   KCal: 2200-2400 Protein: 105-120 g Fluid: > 2.2 L  Goal TPN rate with lipids: 80 mL/hr (this provides 109 g of protein, 345 g of dextrose, and 61 g of lipids which provides 2227 kCals per day, meeting 100% of patient needs)  Current Nutrition:   Plan:  -Continue TPN at goal rate of 80 mL/hr  -This TPN provides 115 g of protein, 48 g lipids, 288 g of dextrose which provides 1920 kCals per day, meeting 100% of patient protein needs and 87% of Kcal needs -Electrolytes in TPN: Reduce K, other lytes to standard.  CH:YIFOYDX 1:1 -Add MVI, trace elements Mon/Wed/Fri to TPN    Harvel Quale 05/17/2019 11:45 AM

## 2019-05-17 NOTE — Progress Notes (Signed)
ANTICOAGULATION CONSULT NOTE - Follow Up Consult  Pharmacy Consult for Heparin Indication: atrial fibrillation  No Known Allergies  Patient Measurements: Height: 6' 0.01" (182.9 cm) Weight: 172 lb 2.9 oz (78.1 kg) IBW/kg (Calculated) : 77.62 Heparin Dosing Weight: 78.1 kg  Vital Signs: Temp: 98.2 F (36.8 C) (07/06 0906) Temp Source: Oral (07/06 0906) BP: 118/63 (07/06 0906) Pulse Rate: 98 (07/06 0906)  Labs: Recent Labs    05/15/19 0849 05/16/19 0505 05/17/19 0358  HGB 10.5* 10.9* 11.6*  HCT 33.7* 35.6* 37.0*  PLT 221 220 186  LABPROT 14.2  --   --   INR 1.1  --   --   HEPARINUNFRC 0.30 0.53 0.39  CREATININE 0.74  --  0.88    Estimated Creatinine Clearance: 80.8 mL/min (by C-G formula based on SCr of 0.88 mg/dL).  Assessment:  73 yo male admitted with abdominal pain. S/p small bowel resection on 05/12/19. He was on warfarin prior to admission for AFib. INR therapeutic on admit. Warfarin reversed with Vitamin K and FFP on 6/27 and 6/28 and remains on hold. Heparin drip resumed post-op on 7/1.    Heparin level remains therapeutic (0.39) on 1450 units/hr. Platelet count trending down some but within normal range.  No bleeding reported.    Goal of Therapy:  Heparin level 0.3-0.7 units/ml Monitor platelets by anticoagulation protocol: Yes   Plan:   Continue heparin drip at 1450 units/hr  Daily heparin level and CBC. Watch platelet count.  Warfarin on hold.  Arty Baumgartner, [place] Pager: [phone] or phone: [phone] 05/17/2019,9:19 AM

## 2019-05-17 NOTE — Progress Notes (Signed)
[facility] Progress Note  6 Days Post-Op  Subjective: CC-  Patient with no complaints this morning. NG tube removed yesterday. Denies abdominal pain, n/v. Does not think he has passed any flatus this AM. No BM since surgery. Worked with therapies last week. Patient's wife hopeful that he can return home with her and home health aide rather than going to SNF.  Objective: Vital signs in last 24 hours: Temp:  [97.8 F (36.6 C)-98.7 F (37.1 C)] 98.2 F (36.8 C) (07/06 0404) Resp:  [13-19] 19 (07/05 1714) BP: (107-129)/(62-74) 129/74 (07/05 1714) SpO2:  [97 %-100 %] 100 % (07/05 1714) Weight:  [78.1 kg] 78.1 kg (07/06 0541) Last BM Date: 05/06/19  Intake/Output from previous day: 07/05 0701 - 07/06 0700 In: 2792.5 [I.V.:2792.5] Out: 3350 [Urine:3350] Intake/Output this shift: No intake/output data recorded.  PE: Gen:  Alert, NAD, pleasant HEENT: EOM's intact, pupils equal and round Card:  irregular Pulm:  CTAB, no W/R/R, effort normal Abd: Soft, ND, NT, +BS, open midline incision pink without erythema or drainage/ some bloody drainage noted on packing but I do not see any active bleeding from the wound Skin: warm and dry   Lab Results:  Recent Labs    05/16/19 0505 05/17/19 0358  WBC 9.0 8.8  HGB 10.9* 11.6*  HCT 35.6* 37.0*  PLT 220 186   BMET Recent Labs    05/15/19 0849 05/17/19 0358  NA 139 135  K 4.6 5.4*  CL 108 106  CO2 23 21*  GLUCOSE 133* 132*  BUN 29* 36*  CREATININE 0.74 0.88  CALCIUM 9.0 9.4   PT/INR Recent Labs    05/15/19 0849  LABPROT 14.2  INR 1.1   CMP     Component Value Date/Time   NA 135 05/17/2019 0358   NA 135 07/16/2017 1348   K 5.4 (H) 05/17/2019 0358   CL 106 05/17/2019 0358   CO2 21 (L) 05/17/2019 0358   GLUCOSE 132 (H) 05/17/2019 0358   BUN 36 (H) 05/17/2019 0358   BUN 13 07/16/2017 1348   CREATININE 0.88 05/17/2019 0358   CALCIUM 9.4 05/17/2019 0358   CALCIUM 8.2 (L) 02/10/2019 0302   PROT 7.1  05/17/2019 0358   ALBUMIN 3.0 (L) 05/17/2019 0358   AST 63 (H) 05/17/2019 0358   ALT 121 (H) 05/17/2019 0358   ALKPHOS 100 05/17/2019 0358   BILITOT 1.0 05/17/2019 0358   GFRNONAA >60 05/17/2019 0358   GFRAA >60 05/17/2019 0358   Lipase     Component Value Date/Time   LIPASE 24 05/05/2019 1707       Studies/Results: No results found.  Anti-infectives: Anti-infectives (From admission, onward)   Start     Dose/Rate Route Frequency Ordered Stop   05/06/19 2100  vancomycin (VANCOCIN) 1,250 mg in sodium chloride 0.9 % 250 mL IVPB  Status:  Discontinued     1,250 mg 166.7 mL/hr over 90 Minutes Intravenous Every 24 hours 05/05/19 1849 05/05/19 2057   05/06/19 0800  ceFEPIme (MAXIPIME) 2 g in sodium chloride 0.9 % 100 mL IVPB  Status:  Discontinued     2 g 200 mL/hr over 30 Minutes Intravenous Every 12 hours 05/05/19 1849 05/05/19 2057   05/06/19 0800  ceFEPIme (MAXIPIME) 2 g in sodium chloride 0.9 % 100 mL IVPB  Status:  Discontinued     2 g 200 mL/hr over 30 Minutes Intravenous Every 12 hours 05/05/19 2114 05/11/19 1257   05/05/19 1830  ceFEPIme (MAXIPIME) 2 g in sodium chloride  0.9 % 100 mL IVPB     2 g 200 mL/hr over 30 Minutes Intravenous  Once 05/05/19 1817 05/05/19 2019   05/05/19 1830  metroNIDAZOLE (FLAGYL) IVPB 500 mg     500 mg 100 mL/hr over 60 Minutes Intravenous  Once 05/05/19 1817 05/05/19 2011   05/05/19 1830  vancomycin (VANCOCIN) IVPB 1000 mg/200 mL premix  Status:  Discontinued     1,000 mg 200 mL/hr over 60 Minutes Intravenous  Once 05/05/19 1817 05/05/19 1825   05/05/19 1830  vancomycin (VANCOCIN) 1,500 mg in sodium chloride 0.9 % 500 mL IVPB     1,500 mg 250 mL/hr over 120 Minutes Intravenous  Once 05/05/19 1825 05/05/19 2238       Assessment/Plan Atrial fibrillation - coumadin at home. Currently on IV diltiazem q6hr and IV heparin HTN HLD BPH Dementia Recent left patella tendon repair 02/11/2019 Malnutrition - ?prealbumin 28 today from 15.1, on  TPN  POD 6 elap with loa 6/30- [place] SBO-Hx small bowel obstruction 03/01/19/small bowel resection 2004,Hx appendectomy 2004 - BID wet to dry dressing changes - mobilize, OOB, PT/OT - NG tube removed 7/5 - no BM, unsure if passing any flatus, good BS today  FEN-NPO, CLD, TPN  VTE- heparin gtt ID- WBC 8.8, afebrile Foley- difficult to place in OR, ok to remove from surgical standpoint Contact - wife Farris Geiman [phone] or [phone]  Plan - Trial clear liquids today. If any concern for aspiration recommend speech consult, although he did not have any issues with dysphagia prior to surgery. Continue TPN. Continue therapies, mobilize. I attempted to call Mr. Klinke wife, no answer, will try again later.   LOS: 12 days    Wellington Hampshire , [facility] 05/17/2019, 8:40 AM Pager: [phone] Mon-Thurs 7:00 am-4:30 pm Fri 7:00 am -11:30 AM Sat-Sun 7:00 am-11:30 am

## 2019-05-17 NOTE — Progress Notes (Signed)
Nutrition Follow-up  DOCUMENTATION CODES:   Not applicable  INTERVENTION:   -TPN management per pharmacy -Boost Breeze po BID, each supplement provides 250 kcal and 9 grams of protein -RD will follow for diet advancement and adjust supplement regimen as appropriate  NUTRITION DIAGNOSIS:   Inadequate oral intake related to altered GI function(SBO) as evidenced by NPO status.  Ongoing  GOAL:   Patient will meet greater than or equal to 90% of their needs  Met with TPN  MONITOR:   Diet advancement, Labs, Weight trends, Skin, I & O's  REASON FOR ASSESSMENT:   Low Braden    ASSESSMENT:   73 year old male with medical history significant of chronic a-fib, HTN, HLD, dementia admitted with SBO and cholelithiasis.   6/26- NGT placed to low intermittent suction  6/30- S/p ex lap with lysis of adhesions 7/1- PICC placed, TPN to be initiated 7/2- transferred from ICU to PCU 7/5- NGT removed  Reviewed I/O's: -556 ml x 24 hours and -2.3 L since admission  UOP: 3.4 L x 24 hours  Pt receiving nursing care at time of visit. Pt unable to provide further input secondary to dementia.   Pt has been advanced to clear liquids today. He remains TPN dependent- receiving TPN @ 80 ml/hr, which provides 1653 kcals and 120 grams of protein, meeting 75% of estimated kcals needs and 100% of estimated protein needs (lipids removed in Sunday and Monday's TPN secondary to temporary shortage of 30% lipids). TPN at 1800 will continue at 80 ml/hr to provide 1920 kcals and 115 grams protein, meeting 87% of estimated kcal needs and 100% of estimated protein needs. Per pharmacy, plan to resume lipids on Tuesday, 05/18/19.   Labs reviewed: K: 5.4 (reduced in TPN this evening), CBGS: 100-118 (inpatient orders for glycemic control are 0-9 units insulin aspart every 6 hours).   Diet Order:   Diet Order            Diet clear liquid Room service appropriate? Yes; Fluid consistency: Thin  Diet effective now               EDUCATION NEEDS:   No education needs have been identified at this time  Skin:  Skin Assessment: Skin Integrity Issues: Skin Integrity Issues:: Other (Comment), Incisions Incisions: closed abdomen Other: MASD to bilateral buttocks  Last BM:  05/17/19  Height:   Ht Readings from Last 1 Encounters:  05/11/19 6' 0.01" (1.829 m)    Weight:   Wt Readings from Last 1 Encounters:  05/17/19 78.1 kg    Ideal Body Weight:  80.91 kg  BMI:  Body mass index is 23.35 kg/m.  Estimated Nutritional Needs:   Kcal:  2200-2400  Protein:  105-120 grams  Fluid:  > 2.2 L    Russell Dillon, RD, LDN, [place] Registered Dietitian II Certified Diabetes Care and Education Specialist Pager: [phone] After hours Pager: [phone]

## 2019-05-17 NOTE — Progress Notes (Addendum)
PROGRESS NOTE    Russell Dillon  ASN:[id number] DOB: Mar 06, 1946 DOA: 05/05/2019 PCP: Marton Redwood, MD   Brief Narrative:  Patient is a 73 year old male with past medical history of chronic atrial fibrillation on warfarin, hypertension, hyperlipidemia, diastolic dysfunction with aortic valve regurgitation, history of SBO, BPH, dementia who presents with complaints of abdominal pain.  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  He was recently found to have urinary tract infection and was treated with antibiotics and outpatient. CT imaging showed small bowel obstruction with transition point .  General surgery consulted. NGT placed on 05/07/2019. No improvement 5 days later. Pt was taken to OR for LOA. He was hypotensive post operatively and was on 4N ICU with neo drip. Currently off pressors.  General surgery following.  On TPN.  No flatus or bowel movement till today.Started on clears today.   Assessment & Plan:   Principal Problem:   Sepsis secondary to UTI [facility]) Active Problems:   Atrial fibrillation, chronic   Long term (current) use of anticoagulants   SBO (small bowel obstruction) (HCC)   Dementia without behavioral disturbance (HCC)   AKI (acute kidney injury) ([place])   Transient hypotension   SBO:CT imaging showed small bowel obstruction with transition point .  General surgery consulted. NGT placed on 05/07/2019. No improvement 5 days later. Pt was taken to OR for LOA.  POD 6. General surgery following. This morning he denies any abdomen pain.  Denies any flatus or bowel movement.   On TPN. Started on clear liquid diet.  Hypotension status post surgery: Currently blood pressure stable.  He was put on pressures after surgery because of hypotension.  Suspected sepsis secondary to urinary tract infection: Urine culture showed diphtheroids.  Antibiotics has been discontinued.  Acute kidney injury: Resolved  Hyperkalemia:  Potassium of 5.4.  We will simply monitor.  Check BMP tomorrow  Permanent A. fib on anticoagulation:CHA2DS2-VASc score = at least 3.  Currently on heparin drip.  Resume Coumadin when appropriate and able to take by mouth.On  cardizem for rate control now.  History of dementia with behavioral disturbance: On Aricept,Lithium at home.  Continue supportive care.  Currently mental status stable.Will resume his home meds.  Debility/deconditioning: Patient evaluated by physical therapy and recommended home health on discharge    Nutrition Problem: Inadequate oral intake Etiology: altered GI function(SBO)      DVT prophylaxis: Heparin IV Code Status: Full Family Communication: Called wife,call not received Disposition Plan: Awaiting bowel function   Consultants: General surgery  Procedures: Exploratory laparotomy with lysis of adhesions  Antimicrobials:  Anti-infectives (From admission, onward)   Start     Dose/Rate Route Frequency Ordered Stop   05/06/19 2100  vancomycin (VANCOCIN) 1,250 mg in sodium chloride 0.9 % 250 mL IVPB  Status:  Discontinued     1,250 mg 166.7 mL/hr over 90 Minutes Intravenous Every 24 hours 05/05/19 1849 05/05/19 2057   05/06/19 0800  ceFEPIme (MAXIPIME) 2 g in sodium chloride 0.9 % 100 mL IVPB  Status:  Discontinued     2 g 200 mL/hr over 30 Minutes Intravenous Every 12 hours 05/05/19 1849 05/05/19 2057   05/06/19 0800  ceFEPIme (MAXIPIME) 2 g in sodium chloride 0.9 % 100 mL IVPB  Status:  Discontinued     2 g 200 mL/hr over 30 Minutes Intravenous Every 12 hours 05/05/19 2114 05/11/19 1257   05/05/19 1830  ceFEPIme (MAXIPIME) 2 g in sodium chloride 0.9 % 100 mL  IVPB     2 g 200 mL/hr over 30 Minutes Intravenous  Once 05/05/19 1817 05/05/19 2019   05/05/19 1830  metroNIDAZOLE (FLAGYL) IVPB 500 mg     500 mg 100 mL/hr over 60 Minutes Intravenous  Once 05/05/19 1817 05/05/19 2011   05/05/19 1830  vancomycin (VANCOCIN) IVPB 1000 mg/200 mL premix  Status:   Discontinued     1,000 mg 200 mL/hr over 60 Minutes Intravenous  Once 05/05/19 1817 05/05/19 1825   05/05/19 1830  vancomycin (VANCOCIN) 1,500 mg in sodium chloride 0.9 % 500 mL IVPB     1,500 mg 250 mL/hr over 120 Minutes Intravenous  Once 05/05/19 1825 05/05/19 2238      Subjective:  Patient seen and examined at bedside this morning.  Hemodynamically stable.  Patient does not report any bowel movement or passage of flatus.  Denies any abdominal pain.  Started on clear liquid diet.  Objective: Vitals:   05/17/19 0404 05/17/19 0541 05/17/19 0906 05/17/19 1307  BP:   118/63 114/65  Pulse:   98 94  Resp:   18 16  Temp: 98.2 F (36.8 C)  98.2 F (36.8 C) 97.6 F (36.4 C)  TempSrc: Oral  Oral Oral  SpO2:   100% 100%  Weight:  78.1 kg    Height:        Intake/Output Summary (Last 24 hours) at 05/17/2019 1449 Last data filed at 05/17/2019 0542 Gross per 24 hour  Intake 2792.51 ml  Output 3350 ml  Net -557.49 ml   Filed Weights   05/14/19 0500 05/15/19 0500 05/17/19 0541  Weight: 78.3 kg 78.8 kg 78.1 kg    Examination:   General exam: looks comfortable Respiratory system: Bilateral equal air entry, normal vesicular breath sounds, no wheezes or crackles  Cardiovascular system: S1 & S2 heard, RRR. No JVD, murmurs, rubs, gallops or clicks. Gastrointestinal system: Abdomen is nondistended, soft and nontender. No bowel sounds heard.Dressings Central nervous system: Alert and oriented. No focal neurological deficits. Extremities: No edema, no clubbing ,no cyanosis, distal peripheral pulses palpable. Skin: No rashes, lesions or ulcers,no icterus ,no pallor    Data Reviewed: I have personally reviewed following labs and imaging studies  CBC: Recent Labs  Lab 05/12/19 0615 05/13/19 0706 05/14/19 0224 05/15/19 0849 05/16/19 0505 05/17/19 0358  WBC 9.3 19.1* 11.3* 8.4 9.0 8.8  NEUTROABS 8.3*  --  9.5*  --   --  6.2  HGB 9.3* 10.0* 10.1* 10.5* 10.9* 11.6*  HCT 30.2* 32.6*  32.3* 33.7* 35.6* 37.0*  MCV 83.2 83.6 82.6 83.4 84.0 83.9  PLT 258 260 257 221 220 419   Basic Metabolic Panel: Recent Labs  Lab 05/12/19 0615 05/13/19 0706 05/14/19 0224 05/15/19 0849 05/17/19 0358  NA 145 143 142 139 135  K 3.7 3.5 3.4* 4.6 5.4*  CL 109 107 108 108 106  CO2 22 25 25 23  21*  GLUCOSE 154* 184* 158* 133* 132*  BUN 18 17 18  29* 36*  CREATININE 0.98 0.84 0.70 0.74 0.88  CALCIUM 8.7* 8.7* 8.7* 9.0 9.4  MG 1.6* 2.0 1.8 1.8 2.0  PHOS 2.5 1.8* 2.4* 3.2 4.3   GFR: Estimated Creatinine Clearance: 80.8 mL/min (by C-G formula based on SCr of 0.88 mg/dL). Liver Function Tests: Recent Labs  Lab 05/12/19 0615 05/13/19 0706 05/17/19 0358  AST 17 24 63*  ALT 17 23 121*  ALKPHOS 62 60 100  BILITOT 1.6* 1.0 1.0  PROT 6.6 6.5 7.1  ALBUMIN 3.1* 2.9* 3.0*  No results for input(s): LIPASE, AMYLASE in the last 168 hours. No results for input(s): AMMONIA in the last 168 hours. Coagulation Profile: Recent Labs  Lab 05/15/19 0849  INR 1.1   Cardiac Enzymes: No results for input(s): CKTOTAL, CKMB, CKMBINDEX, TROPONINI in the last 168 hours. BNP (last 3 results) No results for input(s): PROBNP in the last 8760 hours. HbA1C: No results for input(s): HGBA1C in the last 72 hours. CBG: Recent Labs  Lab 05/16/19 1243 05/16/19 1616 05/16/19 2053 05/17/19 0756 05/17/19 1157  GLUCAP 118* 100* 113* 118* 126*   Lipid Profile: Recent Labs    05/17/19 0358  TRIG 22   Thyroid Function Tests: No results for input(s): TSH, T4TOTAL, FREET4, T3FREE, THYROIDAB in the last 72 hours. Anemia Panel: No results for input(s): VITAMINB12, FOLATE, FERRITIN, TIBC, IRON, RETICCTPCT in the last 72 hours. Sepsis Labs: Recent Labs  Lab 05/13/19 0915 05/14/19 0224 05/15/19 0849  PROCALCITON 0.22 0.16 <0.10    Recent Results (from the past 240 hour(s))  MRSA PCR Screening     Status: None   Collection Time: 05/11/19  6:41 PM   Specimen: Nasopharyngeal  Result Value Ref  Range Status   MRSA by PCR NEGATIVE NEGATIVE Final    Comment:        The GeneXpert MRSA Assay (FDA approved for NASAL specimens only), is one component of a comprehensive MRSA colonization surveillance program. It is not intended to diagnose MRSA infection nor to guide or monitor treatment for MRSA infections. Performed at [facility], [place] [address], [place], [place] [address]   Culture, blood (Routine X 2) w Reflex to ID Panel     Status: None (Preliminary result)   Collection Time: 05/13/19 10:15 AM   Specimen: BLOOD LEFT ARM  Result Value Ref Range Status   Specimen Description BLOOD LEFT ARM  Final   Special Requests   Final    BOTTLES DRAWN AEROBIC ONLY Blood Culture adequate volume   Culture   Final    NO GROWTH 4 DAYS Performed at [facility], [place] [address], [place], [place] [address]    Report Status PENDING  Incomplete  Culture, blood (Routine X 2) w Reflex to ID Panel     Status: None (Preliminary result)   Collection Time: 05/13/19 10:15 AM   Specimen: BLOOD LEFT ARM  Result Value Ref Range Status   Specimen Description BLOOD LEFT ARM  Final   Special Requests   Final    BOTTLES DRAWN AEROBIC ONLY Blood Culture adequate volume   Culture   Final    NO GROWTH 4 DAYS Performed at [facility], [place] [address] [place], [place] [address]    Report Status PENDING  Incomplete         Radiology Studies: No results found.      Scheduled Meds: . Chlorhexidine Gluconate Cloth  6 each Topical Daily  . Chlorhexidine Gluconate Cloth  6 each Topical Q0600  . diltiazem  25 mg Intravenous Q6H  . feeding supplement  1 Container Oral BID BM  . insulin aspart  0-9 Units Subcutaneous Q6H  . mouth rinse  15 mL Mouth Rinse BID  . sodium chloride flush  10-40 mL Intracatheter Q12H  . sodium chloride flush  3 mL Intravenous Q12H   Continuous Infusions: . heparin 1,450 Units/hr (05/17/19 1424)  . lactated ringers 10 mL/hr at 05/11/19 1306  .  methocarbamol (ROBAXIN) IV    . TPN ADULT (ION) 80 mL/hr  at 05/16/19 1812  . TPN ADULT (ION)       LOS: 12 days    Time spent:35 mins. More than 50% of that time was spent in counseling and/or coordination of care.      Shelly Coss, MD [facility] Pager [phone]  If 7PM-7AM, please contact night-coverage [email or web address] Password [email or web address] 05/17/2019, 2:49 PM

## 2019-05-18 DIAGNOSIS — E43 Unspecified severe protein-calorie malnutrition: Secondary | ICD-10-CM | POA: Insufficient documentation

## 2019-05-18 LAB — CULTURE, BLOOD (ROUTINE X 2)
Culture: NO GROWTH
Culture: NO GROWTH
Special Requests: ADEQUATE
Special Requests: ADEQUATE

## 2019-05-18 LAB — BASIC METABOLIC PANEL
Anion gap: 9 (ref 5–15)
BUN: 41 mg/dL — ABNORMAL HIGH (ref 8–23)
CO2: 20 mmol/L — ABNORMAL LOW (ref 22–32)
Calcium: 9.4 mg/dL (ref 8.9–10.3)
Chloride: 106 mmol/L (ref 98–111)
Creatinine, Ser: 0.82 mg/dL (ref 0.61–1.24)
GFR calc Af Amer: >60 mL/min (ref >60)
GFR calc non Af Amer: >60 mL/min (ref >60)
Glucose, Bld: 117 mg/dL — ABNORMAL HIGH (ref 70–99)
Potassium: 4.6 mmol/L (ref 3.5–5.1)
Sodium: 135 mmol/L (ref 135–145)

## 2019-05-18 LAB — GLUCOSE, CAPILLARY
Glucose-Capillary: 132 mg/dL — ABNORMAL HIGH (ref 70–99)
Glucose-Capillary: 117 mg/dL — ABNORMAL HIGH (ref 70–99)
Glucose-Capillary: 120 mg/dL — ABNORMAL HIGH (ref 70–99)
Glucose-Capillary: 125 mg/dL — ABNORMAL HIGH (ref 70–99)

## 2019-05-18 LAB — HEPARIN LEVEL (UNFRACTIONATED): Heparin Unfractionated: 0.67 IU/mL (ref 0.30–0.70)

## 2019-05-18 MED ORDER — WARFARIN SODIUM 5 MG PO TABS
5.0000 mg | ORAL_TABLET | Freq: Once | ORAL | Status: AC
  Administered 2019-05-18: 5 mg via ORAL
  Filled 2019-05-18: qty 1

## 2019-05-18 MED ORDER — WARFARIN - PHARMACIST DOSING INPATIENT: Freq: Every day | Status: DC

## 2019-05-18 MED ORDER — DOCUSATE SODIUM 100 MG PO CAPS
100.0000 mg | ORAL_CAPSULE | Freq: Two times a day (BID) | ORAL | Status: DC
  Administered 2019-05-18 – 2019-05-20 (×5): 100 mg via ORAL
  Filled 2019-05-18 (×5): qty 1

## 2019-05-18 MED ORDER — POLYETHYLENE GLYCOL 3350 17 G PO PACK
17.0000 g | PACK | Freq: Every day | ORAL | Status: DC
  Administered 2019-05-18 – 2019-05-20 (×3): 17 g via ORAL
  Filled 2019-05-18 (×3): qty 1

## 2019-05-18 MED ORDER — ENSURE ENLIVE PO LIQD
237.0000 mL | Freq: Two times a day (BID) | ORAL | Status: DC
  Administered 2019-05-18 (×2): 237 mL via ORAL

## 2019-05-18 MED ORDER — TRAVASOL 10 % IV SOLN
INTRAVENOUS | Status: AC
  Administered 2019-05-18: 17:00:00 via INTRAVENOUS
  Filled 2019-05-18: qty 1152

## 2019-05-18 NOTE — Progress Notes (Signed)
ANTICOAGULATION CONSULT NOTE - Follow Up Consult  Pharmacy Consult for warfarin  Indication: atrial fibrillation  No Known Allergies  Patient Measurements: Height: 6' 0.01" (182.9 cm) Weight: 172 lb 2.9 oz (78.1 kg) IBW/kg (Calculated) : 77.62 Heparin Dosing Weight: 78.1 kg  Vital Signs: Temp: 98.3 F (36.8 C) (07/07 1218) Temp Source: Oral (07/07 1218) BP: 100/65 (07/07 1218) Pulse Rate: 102 (07/07 1218)  Labs: Recent Labs    05/16/19 0505 05/17/19 0358 05/18/19 0421  HGB 10.9* 11.6*  --   HCT 35.6* 37.0*  --   PLT 220 186  --   HEPARINUNFRC 0.53 0.39 0.67  CREATININE  --  0.88 0.82    Estimated Creatinine Clearance: 86.7 mL/min (by C-G formula based on SCr of 0.82 mg/dL).  Assessment:  73 yo male admitted with abdominal pain. S/p small bowel resection on 05/12/19. He was on warfarin prior to admission for AFib. INR therapeutic on admit. Warfarin reversed with Vitamin K and FFP on 6/27 and 6/28 and remains on hold. Heparin drip resumed post-op on 7/1.   Discussed with MD plan to discontinue heparin bridge and to resume warfarin. Last INR 1.1 on 7/4. Last warfarin dose was 6/23. Hemoglobin 11.6 and platelets 186. No signs/symptoms of bleeding documented. On TPN and starting diet full liquid on 7/7. CHA2DS2-VASc score of 3. PTA warfarin dose 2.5 mg daily.     Goal of Therapy:  INR 2-3 Monitor platelets by anticoagulation protocol: Yes   Plan:  Heparin drip discontinued per MD  Start warfarin 5 mg x 1 dose  Monitor daily INR   Thank you,   Eddie Candle, PharmD PGY-1 Pharmacy Resident

## 2019-05-18 NOTE — Progress Notes (Signed)
ANTICOAGULATION CONSULT NOTE - Follow Up Consult  Pharmacy Consult for Heparin Indication: atrial fibrillation  No Known Allergies  Patient Measurements: Height: 6' 0.01" (182.9 cm) Weight: 172 lb 2.9 oz (78.1 kg) IBW/kg (Calculated) : 77.62 Heparin Dosing Weight: 78.1 kg  Vital Signs: Temp: 97.6 F (36.4 C) (07/07 0828) Temp Source: Oral (07/07 0828) BP: 99/57 (07/07 0828) Pulse Rate: 94 (07/07 0828)  Labs: Recent Labs    05/16/19 0505 05/17/19 0358 05/18/19 0421  HGB 10.9* 11.6*  --   HCT 35.6* 37.0*  --   PLT 220 186  --   HEPARINUNFRC 0.53 0.39 0.67  CREATININE  --  0.88 0.82    Estimated Creatinine Clearance: 86.7 mL/min (by C-G formula based on SCr of 0.82 mg/dL).  Assessment:  73 yo male admitted with abdominal pain. S/p small bowel resection on 05/12/19. He was on warfarin prior to admission for AFib. INR therapeutic on admit. Warfarin reversed with Vitamin K and FFP on 6/27 and 6/28 and remains on hold. Heparin drip resumed post-op on 7/1.    Heparin level remains therapeutic (0.67) on 1450 units/hr. CBC last done 7/6 , platelet count trending down some but within normal range.  No bleeding reported.    Goal of Therapy:  Heparin level 0.3-0.7 units/ml Monitor platelets by anticoagulation protocol: Yes   Plan:   Continue heparin drip at 1450 units/hr  Daily heparin level.   CBC q72h . Watch platelet count.  Warfarin on hold.  Nicole Cella, RPh Clinical Pharmacist [phone] Please check AMION for all [place] phone numbers After 10:00 PM, call [place] [phone] 05/18/2019,8:53 AM

## 2019-05-18 NOTE — Progress Notes (Signed)
Daughter at the bedside, she was updated. Also called [name], pt's wife and updated her. Wife is concerned about DC states she would like advanced notice to get house and extra help ready. Wife was reassured will pass this information to the oncoming nurse.   Paulla Fore, RN

## 2019-05-18 NOTE — Progress Notes (Signed)
[facility] Progress Note  7 Days Post-Op  Subjective: CC-  Feeling well today. Denies abdominal pain, nausea, vomiting. Not drinking very much but he is tolerating clear liquids. He had a small BM yesterday and thinks he passed a little flatus.   Objective: Vital signs in last 24 hours: Temp:  [97.6 F (36.4 C)-98.2 F (36.8 C)] 97.8 F (36.6 C) (07/07 4580) Pulse Rate:  [62-99] 91 (07/07 0613) Resp:  [16-20] 20 (07/07 0613) BP: (90-118)/(51-74) 90/51 (07/07 0613) SpO2:  [99 %-100 %] 100 % (07/07 0613) Last BM Date: 05/17/19  Intake/Output from previous day: 07/06 0701 - 07/07 0700 In: 1336.9 [I.V.:1336.9] Out: 1900 [Urine:1900] Intake/Output this shift: No intake/output data recorded.  PE: Gen: Alert, NAD, pleasant HEENT: EOM's intact, pupils equal and round Card:irregular Pulm: CTAB, no W/R/R, effort normal Abd: Soft,ND, NT, +BS, open midline incision pink without erythema or drainage Skin: warm and dry   Lab Results:  Recent Labs    05/16/19 0505 05/17/19 0358  WBC 9.0 8.8  HGB 10.9* 11.6*  HCT 35.6* 37.0*  PLT 220 186   BMET Recent Labs    05/17/19 0358 05/18/19 0421  NA 135 135  K 5.4* 4.6  CL 106 106  CO2 21* 20*  GLUCOSE 132* 117*  BUN 36* 41*  CREATININE 0.88 0.82  CALCIUM 9.4 9.4   PT/INR Recent Labs    05/15/19 0849  LABPROT 14.2  INR 1.1   CMP     Component Value Date/Time   NA 135 05/18/2019 0421   NA 135 07/16/2017 1348   K 4.6 05/18/2019 0421   CL 106 05/18/2019 0421   CO2 20 (L) 05/18/2019 0421   GLUCOSE 117 (H) 05/18/2019 0421   BUN 41 (H) 05/18/2019 0421   BUN 13 07/16/2017 1348   CREATININE 0.82 05/18/2019 0421   CALCIUM 9.4 05/18/2019 0421   CALCIUM 8.2 (L) 02/10/2019 0302   PROT 7.1 05/17/2019 0358   ALBUMIN 3.0 (L) 05/17/2019 0358   AST 63 (H) 05/17/2019 0358   ALT 121 (H) 05/17/2019 0358   ALKPHOS 100 05/17/2019 0358   BILITOT 1.0 05/17/2019 0358   GFRNONAA >60 05/18/2019 0421   GFRAA >60  05/18/2019 0421   Lipase     Component Value Date/Time   LIPASE 24 05/05/2019 1707       Studies/Results: No results found.  Anti-infectives: Anti-infectives (From admission, onward)   Start     Dose/Rate Route Frequency Ordered Stop   05/06/19 2100  vancomycin (VANCOCIN) 1,250 mg in sodium chloride 0.9 % 250 mL IVPB  Status:  Discontinued     1,250 mg 166.7 mL/hr over 90 Minutes Intravenous Every 24 hours 05/05/19 1849 05/05/19 2057   05/06/19 0800  ceFEPIme (MAXIPIME) 2 g in sodium chloride 0.9 % 100 mL IVPB  Status:  Discontinued     2 g 200 mL/hr over 30 Minutes Intravenous Every 12 hours 05/05/19 1849 05/05/19 2057   05/06/19 0800  ceFEPIme (MAXIPIME) 2 g in sodium chloride 0.9 % 100 mL IVPB  Status:  Discontinued     2 g 200 mL/hr over 30 Minutes Intravenous Every 12 hours 05/05/19 2114 05/11/19 1257   05/05/19 1830  ceFEPIme (MAXIPIME) 2 g in sodium chloride 0.9 % 100 mL IVPB     2 g 200 mL/hr over 30 Minutes Intravenous  Once 05/05/19 1817 05/05/19 2019   05/05/19 1830  metroNIDAZOLE (FLAGYL) IVPB 500 mg     500 mg 100 mL/hr over 60 Minutes  Intravenous  Once 05/05/19 1817 05/05/19 2011   05/05/19 1830  vancomycin (VANCOCIN) IVPB 1000 mg/200 mL premix  Status:  Discontinued     1,000 mg 200 mL/hr over 60 Minutes Intravenous  Once 05/05/19 1817 05/05/19 1825   05/05/19 1830  vancomycin (VANCOCIN) 1,500 mg in sodium chloride 0.9 % 500 mL IVPB     1,500 mg 250 mL/hr over 120 Minutes Intravenous  Once 05/05/19 1825 05/05/19 2238       Assessment/Plan Atrial fibrillation - coumadin at home. Currently on IV diltiazem q6hr and IV heparin HTN HLD BPH Dementia Recent left patella tendon repair 02/11/2019 Malnutrition - ?prealbumin 28 today from 15.1, on TPN  POD7elap with loa 6/30- [place] SBO-Hx small bowel obstruction 03/01/19/small bowel resection 2004,Hx appendectomy 2004 -BID wet to dry dressing changes - mobilize, OOB, PT/OT - NG tube removed 7/5 -  small BM 7/6, tolerating clear liquids  FEN- FLD, TPN  VTE- heparin gtt ID-WBC 8.8 (7/6), afebrile Foley- difficult to placein OR, ok to remove from surgical standpoint Contact - wife Emmerson Taddei [phone] or [phone]  Plan - Advance to full liquids. Add miralax/colace. Continue TPN until tolerating more by mouth. Ok to d/c foley from surgical standpoint.   LOS: 13 days    Wellington Hampshire , [facility] 05/18/2019, 7:28 AM Pager: [phone] Mon-Thurs 7:00 am-4:30 pm Fri 7:00 am -11:30 AM Sat-Sun 7:00 am-11:30 am

## 2019-05-18 NOTE — Progress Notes (Signed)
PROGRESS NOTE    Russell Dillon  XVQ:[id number] DOB: [birth year] DOA: 05/05/2019 PCP: Marton Redwood, MD   Brief Narrative:  Patient is a 73 year old male with past medical history of chronic atrial fibrillation on warfarin, hypertension, hyperlipidemia, diastolic dysfunction with aortic valve regurgitation, history of SBO, BPH, dementia who presents with complaints of abdominal pain.  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  He was recently found to have urinary tract infection and was treated with antibiotics and outpatient. CT imaging showed small bowel obstruction with transition point .  General surgery consulted. NGT placed on 05/07/2019. No improvement 5 days later. Pt was taken to OR for LOA. He was hypotensive post operatively and was on 4N ICU with neo drip. Currently off pressors.  General surgery following.  On TPN.    Assessment & Plan:   Principal Problem:   Sepsis secondary to UTI [facility]) Active Problems:   Atrial fibrillation, chronic   Long term (current) use of anticoagulants   SBO (small bowel obstruction) (HCC)   Dementia without behavioral disturbance (HCC)   AKI (acute kidney injury) ([place])   Transient hypotension   Protein-calorie malnutrition, severe   SBO:CT imaging showed small bowel obstruction with transition point .  General surgery consulted. NGT placed on 05/07/2019. No improvement 5 days later. Pt was taken to OR for LOA.  POD 7. General surgery following. This morning he denies any abdomen pain.  He said he had a small bowel movement yesterday.Passing flatus now.Started on full liquid diet.  Continue TPN until further improvement in the oral intake.  Hypotension status post surgery: Currently blood pressure stable.  He was put on pressures after surgery because of hypotension.  Suspected sepsis secondary to urinary tract infection: Urine culture showed diphtheroids.  Antibiotics has been discontinued.  Acute kidney injury: Resolved  Hyperkalemia: Resolved  Permanent A. fib on anticoagulation:CHA2DS2-VASc score = at least 3.  Was on heparin drip.  Will resume coumadin.On  cardizem for rate control now.  History of dementia with behavioral disturbance: On Aricept,Lithium at home.  Continue supportive care.  Currently mental status stable.Will resume his home meds.  Debility/deconditioning: Patient evaluated by physical therapy and recommended home health on discharge.At baseline, patient has been nonambulatory since previous patella rupturing March this year.    Nutrition Problem: Severe Malnutrition Etiology: chronic illness(dementia)      DVT prophylaxis: Heparin IV Code Status: Full Family Communication: Called wife twice ,went into voice mail.Called daughter on phone Disposition Plan: Home with home health after clearance from surgery.   Consultants: General surgery  Procedures: Exploratory laparotomy with lysis of adhesions  Antimicrobials:  Anti-infectives (From admission, onward)   Start     Dose/Rate Route Frequency Ordered Stop   05/06/19 2100  vancomycin (VANCOCIN) 1,250 mg in sodium chloride 0.9 % 250 mL IVPB  Status:  Discontinued     1,250 mg 166.7 mL/hr over 90 Minutes Intravenous Every 24 hours 05/05/19 1849 05/05/19 2057   05/06/19 0800  ceFEPIme (MAXIPIME) 2 g in sodium chloride 0.9 % 100 mL IVPB  Status:  Discontinued     2 g 200 mL/hr over 30 Minutes Intravenous Every 12 hours 05/05/19 1849 05/05/19 2057   05/06/19 0800  ceFEPIme (MAXIPIME) 2 g in sodium chloride 0.9 % 100 mL IVPB  Status:  Discontinued     2 g 200 mL/hr over 30 Minutes Intravenous Every 12 hours 05/05/19 2114 05/11/19 1257   05/05/19 1830  ceFEPIme (MAXIPIME) 2 g  in sodium chloride 0.9 % 100 mL IVPB     2 g 200 mL/hr over 30 Minutes Intravenous  Once 05/05/19 1817 05/05/19 2019   05/05/19 1830  metroNIDAZOLE (FLAGYL) IVPB 500 mg     500 mg 100 mL/hr over 60 Minutes Intravenous  Once  05/05/19 1817 05/05/19 2011   05/05/19 1830  vancomycin (VANCOCIN) IVPB 1000 mg/200 mL premix  Status:  Discontinued     1,000 mg 200 mL/hr over 60 Minutes Intravenous  Once 05/05/19 1817 05/05/19 1825   05/05/19 1830  vancomycin (VANCOCIN) 1,500 mg in sodium chloride 0.9 % 500 mL IVPB     1,500 mg 250 mL/hr over 120 Minutes Intravenous  Once 05/05/19 1825 05/05/19 2238      Subjective:  Patient seen and examined the bedside this morning.  Remains comfortable.  Denies any abdominal pain.  Had a small bowel movement yesterday.  Tolerating full liquid diet.  Passing flatus.  Hemodynamically stable.  Objective: Vitals:   05/17/19 2102 05/18/19 0613 05/18/19 0828 05/18/19 1218  BP: 116/74 (!) 90/51 (!) 99/57 100/65  Pulse: 62 91 94 (!) 102  Resp: 18 20 14 16   Temp: 97.8 F (36.6 C) 97.8 F (36.6 C) 97.6 F (36.4 C) 98.3 F (36.8 C)  TempSrc: Oral  Oral Oral  SpO2: 99% 100% 100% 99%  Weight:      Height:        Intake/Output Summary (Last 24 hours) at 05/18/2019 1510 Last data filed at 05/18/2019 1325 Gross per 24 hour  Intake 1740.29 ml  Output 2900 ml  Net -1159.71 ml   Filed Weights   05/14/19 0500 05/15/19 0500 05/17/19 0541  Weight: 78.3 kg 78.8 kg 78.1 kg    Examination:   General exam: Appears calm and comfortable ,Not in distress Respiratory system: Bilateral equal air entry, normal vesicular breath sounds, no wheezes or crackles  Cardiovascular system: S1 & S2 heard, RRR. No JVD, murmurs, rubs, gallops or clicks. Gastrointestinal system: Abdomen is nondistended, soft and nontender. No organomegaly or masses felt. Normal bowel sounds heard.  Dressings overlying the surgical wound Central nervous system: Alert and oriented. No focal neurological deficits. Extremities: No edema, no clubbing ,no cyanosis, distal peripheral pulses palpable. Skin: No rashes, lesions or ulcers,no icterus ,no pallor   Data Reviewed: I have personally reviewed following labs and imaging  studies  CBC: Recent Labs  Lab 05/12/19 0615 05/13/19 0706 05/14/19 0224 05/15/19 0849 05/16/19 0505 05/17/19 0358  WBC 9.3 19.1* 11.3* 8.4 9.0 8.8  NEUTROABS 8.3*  --  9.5*  --   --  6.2  HGB 9.3* 10.0* 10.1* 10.5* 10.9* 11.6*  HCT 30.2* 32.6* 32.3* 33.7* 35.6* 37.0*  MCV 83.2 83.6 82.6 83.4 84.0 83.9  PLT 258 260 257 221 220 025   Basic Metabolic Panel: Recent Labs  Lab 05/12/19 0615 05/13/19 0706 05/14/19 0224 05/15/19 0849 05/17/19 0358 05/18/19 0421  NA 145 143 142 139 135 135  K 3.7 3.5 3.4* 4.6 5.4* 4.6  CL 109 107 108 108 106 106  CO2 22 25 25 23  21* 20*  GLUCOSE 154* 184* 158* 133* 132* 117*  BUN 18 17 18  29* 36* 41*  CREATININE 0.98 0.84 0.70 0.74 0.88 0.82  CALCIUM 8.7* 8.7* 8.7* 9.0 9.4 9.4  MG 1.6* 2.0 1.8 1.8 2.0  --   PHOS 2.5 1.8* 2.4* 3.2 4.3  --    GFR: Estimated Creatinine Clearance: 86.7 mL/min (by C-G formula based on SCr of 0.82 mg/dL). Liver  Function Tests: Recent Labs  Lab 05/12/19 0615 05/13/19 0706 05/17/19 0358  AST 17 24 63*  ALT 17 23 121*  ALKPHOS 62 60 100  BILITOT 1.6* 1.0 1.0  PROT 6.6 6.5 7.1  ALBUMIN 3.1* 2.9* 3.0*   No results for input(s): LIPASE, AMYLASE in the last 168 hours. No results for input(s): AMMONIA in the last 168 hours. Coagulation Profile: Recent Labs  Lab 05/15/19 0849  INR 1.1   Cardiac Enzymes: No results for input(s): CKTOTAL, CKMB, CKMBINDEX, TROPONINI in the last 168 hours. BNP (last 3 results) No results for input(s): PROBNP in the last 8760 hours. HbA1C: No results for input(s): HGBA1C in the last 72 hours. CBG: Recent Labs  Lab 05/17/19 1741 05/17/19 2334 05/18/19 0629 05/18/19 0814 05/18/19 1147  GLUCAP 108* 132* 132* 117* 120*   Lipid Profile: Recent Labs    05/17/19 0358  TRIG 22   Thyroid Function Tests: No results for input(s): TSH, T4TOTAL, FREET4, T3FREE, THYROIDAB in the last 72 hours. Anemia Panel: No results for input(s): VITAMINB12, FOLATE, FERRITIN, TIBC, IRON,  RETICCTPCT in the last 72 hours. Sepsis Labs: Recent Labs  Lab 05/13/19 0915 05/14/19 0224 05/15/19 0849  PROCALCITON 0.22 0.16 <0.10    Recent Results (from the past 240 hour(s))  MRSA PCR Screening     Status: None   Collection Time: 05/11/19  6:41 PM   Specimen: Nasopharyngeal  Result Value Ref Range Status   MRSA by PCR NEGATIVE NEGATIVE Final    Comment:        The GeneXpert MRSA Assay (FDA approved for NASAL specimens only), is one component of a comprehensive MRSA colonization surveillance program. It is not intended to diagnose MRSA infection nor to guide or monitor treatment for MRSA infections. Performed at [facility], [place] [address] [place], [place] [address]   Culture, blood (Routine X 2) w Reflex to ID Panel     Status: None   Collection Time: 05/13/19 10:15 AM   Specimen: BLOOD LEFT ARM  Result Value Ref Range Status   Specimen Description BLOOD LEFT ARM  Final   Special Requests   Final    BOTTLES DRAWN AEROBIC ONLY Blood Culture adequate volume   Culture   Final    NO GROWTH 5 DAYS Performed at [facility], [place] [address] [place], [place] [address]    Report Status 05/18/2019 FINAL  Final  Culture, blood (Routine X 2) w Reflex to ID Panel     Status: None   Collection Time: 05/13/19 10:15 AM   Specimen: BLOOD LEFT ARM  Result Value Ref Range Status   Specimen Description BLOOD LEFT ARM  Final   Special Requests   Final    BOTTLES DRAWN AEROBIC ONLY Blood Culture adequate volume   Culture   Final    NO GROWTH 5 DAYS Performed at [facility], [place] [address], [place], [place] [address]    Report Status 05/18/2019 FINAL  Final         Radiology Studies: No results found.      Scheduled Meds: . Chlorhexidine Gluconate Cloth  6 each Topical Daily  . Chlorhexidine Gluconate Cloth  6 each Topical Q0600  . diltiazem  120 mg Oral Daily  . docusate sodium  100 mg Oral BID  . donepezil  10 mg Oral Daily  . feeding  supplement (ENSURE ENLIVE)  237 mL Oral BID BM  . finasteride  5 mg Oral q morning - 10a  .  insulin aspart  0-9 Units Subcutaneous Q6H  . lithium carbonate  150 mg Oral QHS  . mouth rinse  15 mL Mouth Rinse BID  . polyethylene glycol  17 g Oral Daily  . sodium chloride flush  10-40 mL Intracatheter Q12H  . sodium chloride flush  3 mL Intravenous Q12H   Continuous Infusions: . heparin 1,450 Units/hr (05/18/19 0700)  . lactated ringers 10 mL/hr at 05/11/19 1306  . methocarbamol (ROBAXIN) IV    . TPN ADULT (ION) 80 mL/hr at 05/18/19 0700  . TPN ADULT (ION)       LOS: 13 days    Time spent:35 mins. More than 50% of that time was spent in counseling and/or coordination of care.      Shelly Coss, MD [facility] Pager [phone]  If 7PM-7AM, please contact night-coverage [email or web address] Password [email or web address] 05/18/2019, 3:10 PM

## 2019-05-18 NOTE — Progress Notes (Signed)
[facility] CONSULT NOTE   Pharmacy Consult for TPN Indication: prolonged ileus  Patient Measurements: Height: 6' 0.01" (182.9 cm) Weight: 172 lb 2.9 oz (78.1 kg) IBW/kg (Calculated) : 77.62 TPN AdjBW (KG): 80.1 Body mass index is 23.35 kg/m.   Assessment:  Admitted with abdominal pain. Patient has history of prior small bowel resection and appendectomy in 2004. CT on admission shows small bowel obstruction. He was first started on conservative management. Placement of NG tube was initially held due therapeutic INR of 3. On 6/26 warfarin was reversed and NG was placed for decompression. After a few days of no improvement, patient went to OR for small bowel resection with resultant lysis of adhesions.  GI: s/p small bowel resection 05/11/19. Pre-albumin 15.1 > 28. Was NPO for 8 days prior to TPN. Emesis 50 mL. No BM.  Endo: CBGs <150 Insulin requirements in the past 24 hours: 3 units Lytes: K 5.4 > 4.6, other lytes ok Renal: SCr 0.88. BUN 18>>29. UOP 1.3 mL/kg/hr.  Pulm: RA Cards: Currently in AFib, on warfarin PTA, Heparin IV here. Off Diltiazem drip.  Hepatobil: LFTs up AST 17 > 63, ALT 17 > 121, Tbili wnl, TG 22 Neuro: dementia at baseline, on Aricept ID: Completed empiric course of Cefepime. WBC trending down. Afebrile. Procalcitonin neg  TPN Access: PICC placed 7/1 TPN start date: 05/12/19  Nutritional Goals (per RD recommendation on 05/17/19):   KCal: 2200-2400 Protein: 105-120 g Fluid: > 2.2 L  Goal TPN rate with lipids: 80 mL/hr (this provides 109 g of protein, 345 g of dextrose, and 61 g of lipids which provides 2227 kCals per day, meeting 100% of patient needs)  Current Nutrition:  TPN  Plan:  -Continue TPN at goal rate of 80 mL/hr  -This TPN provides 115 g of protein, 56 g lipids, 288 g of dextrose which provides 1996 kCals per day, meeting 100% of patient protein needs and 87% of Kcal needs -Electrolytes in TPN: incr Na, other lytes no  change [id number] 1:1 -Add MVI, trace elements Mon/Wed/Fri to TPN    Harvel Quale 05/18/2019 8:52 AM

## 2019-05-18 NOTE — Progress Notes (Signed)
Nutrition Follow-up  DOCUMENTATION CODES:   Severe malnutrition in context of chronic illness  INTERVENTION:   -TPN management per pharmacy -Magic cup TID with meals, each supplement provides 290 kcal and 9 grams of protein -Ensure Enlive po BID, each supplement provides 350 kcal and 20 grams of protein  NUTRITION DIAGNOSIS:   Severe Malnutrition related to chronic illness(dementia) as evidenced by percent weight loss, severe fat depletion, severe muscle depletion.  Ongoing  GOAL:   Patient will meet greater than or equal to 90% of their needs  Met with TPN  MONITOR:   PO intake, Supplement acceptance, Diet advancement, Labs, Weight trends, Skin, I & O's  REASON FOR ASSESSMENT:   Low Braden    ASSESSMENT:   73 year old male with medical history significant of chronic a-fib, HTN, HLD, dementia admitted with SBO and cholelithiasis.  6/26- NGT placed to low intermittent suction  6/30- S/p ex lap with lysis of adhesions 7/1- PICC placed, TPN to be initiated 7/2- transferred from ICU to PCU 7/5- NGT removed 7/6- advanced to clear liquids 7/7- advanced to full liquids  Reviewed I/O's: -280 ml x 24 hours and -2.5 L since admission  UOP: 1.9 L x 24 hours  Spoke with pt at bedside, who reports feeling better today. He denies any nausea, vomiting, or abdominal pain. Observed full liquid breakfast tray- pt consumed 50% of orange juice and grape juice, 75% of magic cup and 50% of Boost Breeze supplement. He denies any difficulty chewing or swallowing.   Reviewed wt hx; pt has experienced a 14.7% wt loss over the past 3 months.   Pt remains on TPN- per general surgery notes, plan to continue TPN until pt tolerating more by mouth. Pt remains on TPN at 80 ml/hr, which provides 1996 kcals and 115 grams protein, meeting 91% of estimated kcal needs and 100% of estimated protein needs.   Labs reviewed: CBGS: 117-132 (inpatient orders for glycemic control are 0-9 units insulin  aspart every 6 hours).   NUTRITION - FOCUSED PHYSICAL EXAM:    Most Recent Value  Orbital Region  Severe depletion  Upper Arm Region  Moderate depletion  Thoracic and Lumbar Region  Moderate depletion  Buccal Region  Severe depletion  Temple Region  Severe depletion  Clavicle Bone Region  Severe depletion  Clavicle and Acromion Bone Region  Severe depletion  Scapular Bone Region  Severe depletion  Dorsal Hand  Moderate depletion  Patellar Region  Moderate depletion  Anterior Thigh Region  Moderate depletion  Posterior Calf Region  Moderate depletion  Edema (RD Assessment)  Mild  Hair  Reviewed  Eyes  Reviewed  Mouth  Reviewed  Skin  Reviewed  Nails  Reviewed       Diet Order:   Diet Order            Diet full liquid Room service appropriate? Yes; Fluid consistency: Thin  Diet effective now              EDUCATION NEEDS:   No education needs have been identified at this time  Skin:  Skin Assessment: Skin Integrity Issues: Skin Integrity Issues:: Other (Comment), Incisions Incisions: closed abdomen Other: MASD to bilateral buttocks  Last BM:  05/17/19  Height:   Ht Readings from Last 1 Encounters:  05/11/19 6' 0.01" (1.829 m)    Weight:   Wt Readings from Last 1 Encounters:  05/17/19 78.1 kg    Ideal Body Weight:  80.91 kg  BMI:  Body mass  index is 23.35 kg/m.  Estimated Nutritional Needs:   Kcal:  2200-2400  Protein:  105-120 grams  Fluid:  > 2.2 L    Khloe Hunkele A. Jimmye Norman, RD, LDN, [place] Registered Dietitian II Certified Diabetes Care and Education Specialist Pager: [phone] After hours Pager: [phone]

## 2019-05-19 ENCOUNTER — Inpatient Hospital Stay (HOSPITAL_COMMUNITY): Payer: Medicare Other

## 2019-05-19 LAB — GLUCOSE, CAPILLARY
Glucose-Capillary: 119 mg/dL — ABNORMAL HIGH (ref 70–99)
Glucose-Capillary: 125 mg/dL — ABNORMAL HIGH (ref 70–99)
Glucose-Capillary: 132 mg/dL — ABNORMAL HIGH (ref 70–99)
Glucose-Capillary: 135 mg/dL — ABNORMAL HIGH (ref 70–99)

## 2019-05-19 LAB — PROTIME-INR
INR: 1.1 (ref 0.8–1.2)
Prothrombin Time: 13.7 seconds (ref 11.4–15.2)

## 2019-05-19 MED ORDER — BISACODYL 10 MG RE SUPP
10.0000 mg | Freq: Once | RECTAL | Status: AC
  Administered 2019-05-19: 10 mg via RECTAL
  Filled 2019-05-19: qty 1

## 2019-05-19 MED ORDER — ENSURE ENLIVE PO LIQD
237.0000 mL | Freq: Three times a day (TID) | ORAL | Status: DC
  Administered 2019-05-19 – 2019-05-20 (×4): 237 mL via ORAL

## 2019-05-19 MED ORDER — TRAVASOL 10 % IV SOLN
INTRAVENOUS | Status: DC
  Administered 2019-05-19: 18:00:00 via INTRAVENOUS
  Filled 2019-05-19: qty 1162.8

## 2019-05-19 MED ORDER — WARFARIN SODIUM 5 MG PO TABS
5.0000 mg | ORAL_TABLET | Freq: Once | ORAL | Status: AC
  Administered 2019-05-19: 5 mg via ORAL
  Filled 2019-05-19: qty 1

## 2019-05-19 MED ORDER — TRAMADOL HCL 50 MG PO TABS: 50.0000 mg | ORAL_TABLET | Freq: Four times a day (QID) | ORAL | Status: DC | PRN

## 2019-05-19 MED ORDER — HEPARIN SODIUM (PORCINE) 5000 UNIT/ML IJ SOLN
5000.0000 [IU] | Freq: Three times a day (TID) | INTRAMUSCULAR | Status: DC
  Administered 2019-05-19 (×2): 5000 [IU] via SUBCUTANEOUS
  Filled 2019-05-19 (×2): qty 1

## 2019-05-19 MED ORDER — DILTIAZEM LOAD VIA INFUSION
10.0000 mg | Freq: Once | INTRAVENOUS | Status: AC
  Administered 2019-05-19: 10 mg via INTRAVENOUS
  Filled 2019-05-19: qty 10

## 2019-05-19 MED ORDER — DILTIAZEM HCL-DEXTROSE 100-5 MG/100ML-% IV SOLN (PREMIX)
5.0000 mg/h | INTRAVENOUS | Status: DC
  Administered 2019-05-19: 5 mg/h via INTRAVENOUS
  Filled 2019-05-19 (×2): qty 100

## 2019-05-19 MED ORDER — ACETAMINOPHEN 325 MG PO TABS: 650.0000 mg | ORAL_TABLET | Freq: Four times a day (QID) | ORAL | Status: DC | PRN

## 2019-05-19 NOTE — Progress Notes (Signed)
PROGRESS NOTE    Russell Dillon  UJW:[id number] DOB: October 13, 1946 DOA: 05/05/2019 PCP: Marton Redwood, MD   Brief Narrative:  Patient is a 73 year old male with past medical history of chronic atrial fibrillation on warfarin, hypertension, hyperlipidemia, diastolic dysfunction with aortic valve regurgitation, history of SBO, BPH, dementia who presents with complaints of abdominal pain.  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  He was recently found to have urinary tract infection and was treated with antibiotics and outpatient. CT imaging showed small bowel obstruction with transition point .  General surgery consulted. NGT placed on 05/07/2019. No improvement 5 days later. Pt was taken to OR for LOA. He was hypotensive post operatively and was on 4N ICU with neo drip. Currently off pressors.  General surgery following.  On TPN.  Today he went into Afib with RVR. Started on cardizem drip.   Assessment & Plan:   Principal Problem:   Sepsis secondary to UTI [facility]) Active Problems:   Atrial fibrillation, chronic   Long term (current) use of anticoagulants   SBO (small bowel obstruction) (HCC)   Dementia without behavioral disturbance (HCC)   AKI (acute kidney injury) ([place])   Transient hypotension   Protein-calorie malnutrition, severe   SBO:CT imaging showed small bowel obstruction with transition point .  General surgery consulted. NGT placed on 05/07/2019. No improvement 5 days later. Pt was taken to OR for LOA.  POD 8. General surgery following. This morning he denies any abdomen pain.  He  had a small bowel movement day before yesterday.Continue TPN until further improvement in the oral intake. No bowel movement today.  Started on soft diet.  Started on Dulcolax suppository after abdominal x-ray showed moderate stool burden.  Afib with RVR/Permanent A. fib on anticoagulation:CHA2DS2-VASc score = at least 3.  Was on heparin drip.   Resumed coumadin.Marland Kitchen He was on oral Cardizem.  He went into A. fib with RVR this morning, so started on Cardizem drip.  Hypotension status post surgery: Currently blood pressure stable.  He was put on pressures after surgery because of hypotension.  Suspected sepsis secondary to urinary tract infection: Urine culture showed diphtheroids.  Antibiotics has been discontinued.  Acute kidney injury: Resolved  Hyperkalemia: Resolved  History of dementia with behavioral disturbance: On Aricept,Lithium at home.  Continue supportive care.  Currently mental status stable.Will resume his home meds.  Debility/deconditioning: Patient evaluated by physical therapy and recommended home health on discharge.At baseline, patient has been nonambulatory since previous patella rupturing March this year.    Nutrition Problem: Severe Malnutrition Etiology: chronic illness(dementia)      DVT prophylaxis: Coumadin Code Status: Full Family Communication: Discussed with wife at the bedside  disposition Plan: Home with home health after clearance from surgery.   Consultants: General surgery  Procedures: Exploratory laparotomy with lysis of adhesions  Antimicrobials:  Anti-infectives (From admission, onward)   Start     Dose/Rate Route Frequency Ordered Stop   05/06/19 2100  vancomycin (VANCOCIN) 1,250 mg in sodium chloride 0.9 % 250 mL IVPB  Status:  Discontinued     1,250 mg 166.7 mL/hr over 90 Minutes Intravenous Every 24 hours 05/05/19 1849 05/05/19 2057   05/06/19 0800  ceFEPIme (MAXIPIME) 2 g in sodium chloride 0.9 % 100 mL IVPB  Status:  Discontinued     2 g 200 mL/hr over 30 Minutes Intravenous Every 12 hours 05/05/19 1849 05/05/19 2057   05/06/19 0800  ceFEPIme (MAXIPIME) 2 g in sodium chloride  0.9 % 100 mL IVPB  Status:  Discontinued     2 g 200 mL/hr over 30 Minutes Intravenous Every 12 hours 05/05/19 2114 05/11/19 1257   05/05/19 1830  ceFEPIme (MAXIPIME) 2 g in sodium chloride 0.9 % 100 mL  IVPB     2 g 200 mL/hr over 30 Minutes Intravenous  Once 05/05/19 1817 05/05/19 2019   05/05/19 1830  metroNIDAZOLE (FLAGYL) IVPB 500 mg     500 mg 100 mL/hr over 60 Minutes Intravenous  Once 05/05/19 1817 05/05/19 2011   05/05/19 1830  vancomycin (VANCOCIN) IVPB 1000 mg/200 mL premix  Status:  Discontinued     1,000 mg 200 mL/hr over 60 Minutes Intravenous  Once 05/05/19 1817 05/05/19 1825   05/05/19 1830  vancomycin (VANCOCIN) 1,500 mg in sodium chloride 0.9 % 500 mL IVPB     1,500 mg 250 mL/hr over 120 Minutes Intravenous  Once 05/05/19 1825 05/05/19 2238      Subjective:  Patient seen and examined the bedside this morning.  Looks comfortable.  Denies any abdomen pain.  He felt that he was having a bowel movement during my evaluation.  Objective: Vitals:   05/18/19 1218 05/18/19 2123 05/19/19 0511 05/19/19 1341  BP: 100/65 (!) 107/58 112/72 (!) 95/59  Pulse: (!) 102 (!) 110 (!) 109 99  Resp: 16 18 18    Temp: 98.3 F (36.8 C) 98.1 F (36.7 C) (!) 97.4 F (36.3 C)   TempSrc: Oral Oral Oral   SpO2: 99% 98% 100% 99%  Weight:   79.3 kg   Height:        Intake/Output Summary (Last 24 hours) at 05/19/2019 1440 Last data filed at 05/19/2019 1036 Gross per 24 hour  Intake 1365.58 ml  Output 2200 ml  Net -834.42 ml   Filed Weights   05/15/19 0500 05/17/19 0541 05/19/19 0511  Weight: 78.8 kg 78.1 kg 79.3 kg    Examination:   General exam: Appears calm and comfortable ,Not in distress Respiratory system: Bilateral equal air entry, normal vesicular breath sounds, no wheezes or crackles  Cardiovascular system: Afib. No JVD, murmurs, rubs, gallops or clicks. Gastrointestinal system: Abdomen is nondistended, soft and nontender. No organomegaly or masses felt. Normal bowel sounds heard.  Dressings overlying the surgical wound Central nervous system: Alert and oriented. No focal neurological deficits. Extremities: No edema, no clubbing ,no cyanosis, distal peripheral pulses  palpable. Skin: No rashes, lesions or ulcers,no icterus ,no pallor   Data Reviewed: I have personally reviewed following labs and imaging studies  CBC: Recent Labs  Lab 05/13/19 0706 05/14/19 0224 05/15/19 0849 05/16/19 0505 05/17/19 0358  WBC 19.1* 11.3* 8.4 9.0 8.8  NEUTROABS  --  9.5*  --   --  6.2  HGB 10.0* 10.1* 10.5* 10.9* 11.6*  HCT 32.6* 32.3* 33.7* 35.6* 37.0*  MCV 83.6 82.6 83.4 84.0 83.9  PLT 260 257 221 220 580   Basic Metabolic Panel: Recent Labs  Lab 05/13/19 0706 05/14/19 0224 05/15/19 0849 05/17/19 0358 05/18/19 0421  NA 143 142 139 135 135  K 3.5 3.4* 4.6 5.4* 4.6  CL 107 108 108 106 106  CO2 25 25 23  21* 20*  GLUCOSE 184* 158* 133* 132* 117*  BUN 17 18 29* 36* 41*  CREATININE 0.84 0.70 0.74 0.88 0.82  CALCIUM 8.7* 8.7* 9.0 9.4 9.4  MG 2.0 1.8 1.8 2.0  --   PHOS 1.8* 2.4* 3.2 4.3  --    GFR: Estimated Creatinine Clearance: 86.7 mL/min (by  C-G formula based on SCr of 0.82 mg/dL). Liver Function Tests: Recent Labs  Lab 05/13/19 0706 05/17/19 0358  AST 24 63*  ALT 23 121*  ALKPHOS 60 100  BILITOT 1.0 1.0  PROT 6.5 7.1  ALBUMIN 2.9* 3.0*   No results for input(s): LIPASE, AMYLASE in the last 168 hours. No results for input(s): AMMONIA in the last 168 hours. Coagulation Profile: Recent Labs  Lab 05/15/19 0849 05/19/19 0247  INR 1.1 1.1   Cardiac Enzymes: No results for input(s): CKTOTAL, CKMB, CKMBINDEX, TROPONINI in the last 168 hours. BNP (last 3 results) No results for input(s): PROBNP in the last 8760 hours. HbA1C: No results for input(s): HGBA1C in the last 72 hours. CBG: Recent Labs  Lab 05/18/19 1147 05/18/19 1757 05/19/19 0020 05/19/19 0653 05/19/19 1241  GLUCAP 120* 125* 125* 132* 135*   Lipid Profile: Recent Labs    05/17/19 0358  TRIG 22   Thyroid Function Tests: No results for input(s): TSH, T4TOTAL, FREET4, T3FREE, THYROIDAB in the last 72 hours. Anemia Panel: No results for input(s): VITAMINB12, FOLATE,  FERRITIN, TIBC, IRON, RETICCTPCT in the last 72 hours. Sepsis Labs: Recent Labs  Lab 05/13/19 0915 05/14/19 0224 05/15/19 0849  PROCALCITON 0.22 0.16 <0.10    Recent Results (from the past 240 hour(s))  MRSA PCR Screening     Status: None   Collection Time: 05/11/19  6:41 PM   Specimen: Nasopharyngeal  Result Value Ref Range Status   MRSA by PCR NEGATIVE NEGATIVE Final    Comment:        The GeneXpert MRSA Assay (FDA approved for NASAL specimens only), is one component of a comprehensive MRSA colonization surveillance program. It is not intended to diagnose MRSA infection nor to guide or monitor treatment for MRSA infections. Performed at [facility], [place] [address] [place], [place] [address]   Culture, blood (Routine X 2) w Reflex to ID Panel     Status: None   Collection Time: 05/13/19 10:15 AM   Specimen: BLOOD LEFT ARM  Result Value Ref Range Status   Specimen Description BLOOD LEFT ARM  Final   Special Requests   Final    BOTTLES DRAWN AEROBIC ONLY Blood Culture adequate volume   Culture   Final    NO GROWTH 5 DAYS Performed at [facility], [place] [address], [place], [place] [address]    Report Status 05/18/2019 FINAL  Final  Culture, blood (Routine X 2) w Reflex to ID Panel     Status: None   Collection Time: 05/13/19 10:15 AM   Specimen: BLOOD LEFT ARM  Result Value Ref Range Status   Specimen Description BLOOD LEFT ARM  Final   Special Requests   Final    BOTTLES DRAWN AEROBIC ONLY Blood Culture adequate volume   Culture   Final    NO GROWTH 5 DAYS Performed at [facility], [place] [address] [place], [place] [address]    Report Status 05/18/2019 FINAL  Final         Radiology Studies: Dg Abd Portable 1v  Result Date: 05/19/2019 CLINICAL DATA:  Constipation. EXAM: PORTABLE ABDOMEN - 1 VIEW COMPARISON:  Radiograph of May 11, 2019. FINDINGS: The bowel gas pattern is normal. Moderate stool burden is noted. No radio-opaque calculi or  other significant radiographic abnormality are seen. IMPRESSION: Moderate stool burden.  No abnormal bowel dilatation is noted. Electronically Signed   By: Marijo Conception M.D.   On: 05/19/2019 08:36  Scheduled Meds: . Chlorhexidine Gluconate Cloth  6 each Topical Daily  . Chlorhexidine Gluconate Cloth  6 each Topical Q0600  . docusate sodium  100 mg Oral BID  . donepezil  10 mg Oral Daily  . feeding supplement (ENSURE ENLIVE)  237 mL Oral TID BM  . finasteride  5 mg Oral q morning - 10a  . heparin  5,000 Units Subcutaneous Q8H  . insulin aspart  0-9 Units Subcutaneous Q6H  . lithium carbonate  150 mg Oral QHS  . mouth rinse  15 mL Mouth Rinse BID  . polyethylene glycol  17 g Oral Daily  . sodium chloride flush  10-40 mL Intracatheter Q12H  . sodium chloride flush  3 mL Intravenous Q12H  . warfarin  5 mg Oral ONCE-1800  . Warfarin - Pharmacist Dosing Inpatient   Does not apply q1800   Continuous Infusions: . diltiazem (CARDIZEM) infusion 5 mg/hr (05/19/19 1313)  . lactated ringers 10 mL/hr at 05/11/19 1306  . TPN ADULT (ION) 80 mL/hr at 05/19/19 0627  . TPN ADULT (ION)       LOS: 14 days    Time spent:35 mins. More than 50% of that time was spent in counseling and/or coordination of care.      Shelly Coss, MD [facility] Pager [phone]  If 7PM-7AM, please contact night-coverage [email or web address] Password [email or web address] 05/19/2019, 2:40 PM

## 2019-05-19 NOTE — Progress Notes (Signed)
PHARMACY - ADULT TOTAL PARENTERAL NUTRITION CONSULT NOTE   Pharmacy Consult for TPN Indication: prolonged ileus  Patient Measurements: Height: 6' 0.01" (182.9 cm) Weight: 174 lb 13.2 oz (79.3 kg) IBW/kg (Calculated) : 77.62 TPN AdjBW (KG): 80.1 Body mass index is 23.71 kg/m.   Assessment:  Admitted with abdominal pain. Patient has history of prior small bowel resection and appendectomy in 2004. CT on admission shows small bowel obstruction. He was first started on conservative management. Placement of NG tube was initially held due therapeutic INR of 3. On 6/26 warfarin was reversed and NG was placed for decompression. After a few days of no improvement, patient went to OR for small bowel resection with resultant lysis of adhesions.  GI: s/p small bowel resection 05/11/19. Pre-albumin 15.1 > 28. Was NPO for 8 days prior to TPN. Small BM 7/6, moderate stool burden - docusate, miralax, Dulcolax supp x 1. Tolerating full liquids Endo: CBGs controlled <150 Insulin requirements in the past 24 hours: 4 units Lytes: CO2 down to 20, K 5.4 > 4.6, other lytes WNL - no new labs 7/8 Renal: SCr 0.82 stable. BUN up to 41. UOP 1.3 mL/kg/hr. Finasteride Pulm: RA Cards: Currently in AFib, on warfarin PTA, Heparin IV here. Diltiazem drip >> PO Hepatobil: LFTs up - AST 17 > 63, ALT 17 > 121, Tbili / TG WNL Neuro: dementia at baseline, on Aricept, lithium. Fent PRN ID: Completed empiric course of Cefepime. WBC WNL. Afebrile. Procalcitonin neg  TPN Access: PICC placed 7/1 TPN start date: 05/12/19  Nutritional Goals (per RD recommendation on 05/17/19):   KCal: 2200-2400 Protein: 105-120 g Fluid: > 2.2 L  Current Nutrition:  TPN Feeding supplement (Ensure Enlive) BID (each provides 290 kCal + 9 g protein) Full liquid diet - tolerating 50% meals per charting but not taking in much at all per Surgery  Plan:  -Continue TPN at goal rate 85 mL/hr  -This TPN provides 116 g of protein, 55 g lipids, 286 g of  dextrose which provides 1986 kCals per day, meeting 100% of patient protein needs and 90% of Kcal needs -Electrolytes in TPN: continue increased Na, reduced K - no change today; Cl:Ac to 1:2 -Add MVI, trace elements Mon/Wed/Fri to TPN -F/u TPN labs, toleration of diet and ability to wean TPN (no wean today per Surgery)   Elicia Lamp, PharmD, BCPS Please check AMION for all [facility] contact numbers Clinical Pharmacist 05/19/2019 8:03 AM

## 2019-05-19 NOTE — Progress Notes (Signed)
Writer notified by tele that patient was in A-fib w/RVR with a HR in the 150's. MD Adhikari notified and placed order for cardizem bolus and continuous drip. HR immediately decreased after bolus to 90's-low 100's. Continuing to monitor.

## 2019-05-19 NOTE — Progress Notes (Signed)
[facility] Progress Note  8 Days Post-Op  Subjective: CC-  No complaints. Denies abdominal pain, nausea, vomiting. No flatus or BM. Tolerating few sips of liquids but not eating much at all.  Objective: Vital signs in last 24 hours: Temp:  [97.4 F (36.3 C)-98.3 F (36.8 C)] 97.4 F (36.3 C) (07/08 0511) Pulse Rate:  [94-110] 109 (07/08 0511) Resp:  [14-18] 18 (07/08 0511) BP: (99-112)/(57-72) 112/72 (07/08 0511) SpO2:  [98 %-100 %] 100 % (07/08 0511) Weight:  [79.3 kg] 79.3 kg (07/08 0511) Last BM Date: 05/17/19  Intake/Output from previous day: 07/07 0701 - 07/08 0700 In: 1285.6 [P.O.:240; I.V.:1045.6] Out: 2600 [Urine:2600] Intake/Output this shift: No intake/output data recorded.  PE: Gen: Alert, NAD, pleasant HEENT: EOM's intact, pupils equal and round Card:irregular Pulm: CTAB, no W/R/R, effort normal Abd: Soft,ND, NT,+BS, open midline incision pink without erythema or drainage Skin: warm and dry   Lab Results:  Recent Labs    05/17/19 0358  WBC 8.8  HGB 11.6*  HCT 37.0*  PLT 186   BMET Recent Labs    05/17/19 0358 05/18/19 0421  NA 135 135  K 5.4* 4.6  CL 106 106  CO2 21* 20*  GLUCOSE 132* 117*  BUN 36* 41*  CREATININE 0.88 0.82  CALCIUM 9.4 9.4   PT/INR Recent Labs    05/19/19 0247  LABPROT 13.7  INR 1.1   CMP     Component Value Date/Time   NA 135 05/18/2019 0421   NA 135 07/16/2017 1348   K 4.6 05/18/2019 0421   CL 106 05/18/2019 0421   CO2 20 (L) 05/18/2019 0421   GLUCOSE 117 (H) 05/18/2019 0421   BUN 41 (H) 05/18/2019 0421   BUN 13 07/16/2017 1348   CREATININE 0.82 05/18/2019 0421   CALCIUM 9.4 05/18/2019 0421   CALCIUM 8.2 (L) 02/10/2019 0302   PROT 7.1 05/17/2019 0358   ALBUMIN 3.0 (L) 05/17/2019 0358   AST 63 (H) 05/17/2019 0358   ALT 121 (H) 05/17/2019 0358   ALKPHOS 100 05/17/2019 0358   BILITOT 1.0 05/17/2019 0358   GFRNONAA >60 05/18/2019 0421   GFRAA >60 05/18/2019 0421   Lipase      Component Value Date/Time   LIPASE 24 05/05/2019 1707       Studies/Results: No results found.  Anti-infectives: Anti-infectives (From admission, onward)   Start     Dose/Rate Route Frequency Ordered Stop   05/06/19 2100  vancomycin (VANCOCIN) 1,250 mg in sodium chloride 0.9 % 250 mL IVPB  Status:  Discontinued     1,250 mg 166.7 mL/hr over 90 Minutes Intravenous Every 24 hours 05/05/19 1849 05/05/19 2057   05/06/19 0800  ceFEPIme (MAXIPIME) 2 g in sodium chloride 0.9 % 100 mL IVPB  Status:  Discontinued     2 g 200 mL/hr over 30 Minutes Intravenous Every 12 hours 05/05/19 1849 05/05/19 2057   05/06/19 0800  ceFEPIme (MAXIPIME) 2 g in sodium chloride 0.9 % 100 mL IVPB  Status:  Discontinued     2 g 200 mL/hr over 30 Minutes Intravenous Every 12 hours 05/05/19 2114 05/11/19 1257   05/05/19 1830  ceFEPIme (MAXIPIME) 2 g in sodium chloride 0.9 % 100 mL IVPB     2 g 200 mL/hr over 30 Minutes Intravenous  Once 05/05/19 1817 05/05/19 2019   05/05/19 1830  metroNIDAZOLE (FLAGYL) IVPB 500 mg     500 mg 100 mL/hr over 60 Minutes Intravenous  Once 05/05/19 1817 05/05/19 2011  05/05/19 1830  vancomycin (VANCOCIN) IVPB 1000 mg/200 mL premix  Status:  Discontinued     1,000 mg 200 mL/hr over 60 Minutes Intravenous  Once 05/05/19 1817 05/05/19 1825   05/05/19 1830  vancomycin (VANCOCIN) 1,500 mg in sodium chloride 0.9 % 500 mL IVPB     1,500 mg 250 mL/hr over 120 Minutes Intravenous  Once 05/05/19 1825 05/05/19 2238       Assessment/Plan Atrial fibrillation - on diltiazem po, restarting coumadin HTN HLD BPH Dementia Recent left patella tendon repair 02/11/2019 Malnutrition -?prealbumin28 today from15.1, on TPN  POD8elap with loa 6/30- [place] SBO-Hx small bowel obstruction 03/01/19/small bowel resection 2004,Hx appendectomy 2004 -BID wet to dry dressing changes - mobilize, OOB, PT/OT -NG tube removed 7/5 - small BM 7/6, tolerating full liquids but not eating  much  FEN- FLD, TPN  VTE- coumadin [id number] (7/6), afebrile Foley- d/c 7/7 Contact - wife Nesta Kimple [phone] or [phone]  Plan - Check abdominal film. Abdomen benign but patient has only had 1 very small BM since surgery and unsure if he has passed flatus. Tolerating few liquids but he is not taking in much to eat. Continue TPN for now.   LOS: 14 days    Wellington Hampshire , [facility] 05/19/2019, 8:07 AM Pager: [phone] Mon-Thurs 7:00 am-4:30 pm Fri 7:00 am -11:30 AM Sat-Sun 7:00 am-11:30 am

## 2019-05-19 NOTE — Progress Notes (Signed)
Physical Therapy Treatment Patient Details Name: Russell Dillon MRN: [id number] DOB: September 11, 1946 Today's Date: 05/19/2019    History of Present Illness Pt is a 73 y/o male admitted secondary to worsening abdominal pain. Pt found to have SBO and is s/p exploratory laparotomy. Pt also with sepsis secondary to UTI. Pt with ICU stay following hypotension. PMH includes a fib, dementia, HTN, and L patellar tendon repair.     PT Comments    Pt agreeable to OOB with mod verbal encouragement from PT, OT, and wife. Pt able to transfer to drop arm recliner with mod assist +2, pt limited by fatigue and agitation. Pt at one time swatting PT's hand away from his LLE when assisting with EOB activity. Pt's wife remains adamant about pt d/cing home with HHPT, will require increased assist including HHPT, HHOT, [place], and [place] aide as listed below. PT to continue to follow acutely.    Follow Up Recommendations  Home health PT;Supervision/Assistance - 24 hour(HHOT, HHaide, HHRN-pt's wife refusing SNF )     Equipment Recommendations  None recommended by PT    Recommendations for Other Services       Precautions / Restrictions Precautions Precautions: Fall Precaution Comments: L knee sensitive to touch Restrictions Weight Bearing Restrictions: No LLE Weight Bearing: Weight bearing as tolerated    Mobility  Bed Mobility Overal bed mobility: Needs Assistance Bed Mobility: Rolling;Sidelying to Sit Rolling: Max assist Sidelying to sit: +2 for physical assistance;Mod assist;HOB elevated       General bed mobility comments: log roll technique for abdominal protection and comfort, verbal cuing for pt to reach L UE to R handrail to facilitate roll but still required max assist to come to sidelying with use of bed pads. Mod assist +2 for LE management and trunk elevation from bed. Upon sitting, pt wanted to lay back down and required mod verbal encouragement to remain sitting EOB.  Transfers Overall  transfer level: Needs assistance Equipment used: (bed pads) Transfers: Lateral/Scoot Transfers          Lateral/Scoot Transfers: +2 physical assistance;Mod assist General transfer comment: mod assist +2 for trunk translation, LE positioning, and placement in drop arm recliner. verbal cuing for sequencing, UE placement and pushing through UEs to assist in scooting, and OT counted off 1-2-3 to assist with pt timing.  Ambulation/Gait Ambulation/Gait assistance: (NT)               Stairs             Wheelchair Mobility    Modified Rankin (Stroke Patients Only)       Balance Overall balance assessment: Needs assistance Sitting-balance support: Feet supported;Single extremity supported;Bilateral upper extremity supported Sitting balance-Leahy Scale: Fair Sitting balance - Comments: pt attempting to return to supine                                    Cognition Arousal/Alertness: Awake/alert Behavior During Therapy: Flat affect;Agitated Overall Cognitive Status: History of cognitive impairments - at baseline                                 General Comments: Pt increasingly agitated with mobility progression from EOB activity to transfer to recliner. Upon sitting EOB, pt states "I want to lay back down" with cursing. In the next few minutes, pt agreeable to OOB mobility.  Exercises      General Comments General comments (skin integrity, edema, etc.): wife present during session      Pertinent Vitals/Pain Pain Assessment: Faces Faces Pain Scale: Hurts little more Pain Location: L knee and abdomen Pain Descriptors / Indicators: Grimacing;Guarding Pain Intervention(s): Limited activity within patient's tolerance;Repositioned;Monitored during session    Home Living                      Prior Function            PT Goals (current goals can now be found in the care plan section) Acute Rehab PT Goals Patient Stated  Goal: per wife, to return home at d/c  PT Goal Formulation: With patient/family Time For Goal Achievement: 05/27/19 Potential to Achieve Goals: Fair Progress towards PT goals: Progressing toward goals    Frequency    Min 3X/week      PT Plan Current plan remains appropriate    Co-evaluation PT/OT/SLP Co-Evaluation/Treatment: Yes Reason for Co-Treatment: For patient/therapist safety;Necessary to address cognition/behavior during functional activity PT goals addressed during session: Mobility/safety with mobility OT goals addressed during session: Strengthening/ROM      AM-PAC PT "6 Clicks" Mobility   Outcome Measure  Help needed turning from your back to your side while in a flat bed without using bedrails?: Total Help needed moving from lying on your back to sitting on the side of a flat bed without using bedrails?: A Lot Help needed moving to and from a bed to a chair (including a wheelchair)?: A Lot Help needed standing up from a chair using your arms (e.g., wheelchair or bedside chair)?: Total Help needed to walk in hospital room?: Total Help needed climbing 3-5 steps with a railing? : Total 6 Click Score: 8    End of Session   Activity Tolerance: Treatment limited secondary to agitation;Patient limited by fatigue Patient left: with call bell/phone within reach;in chair;with family/visitor present(wife states she will be with pt all day, no bed alarm necessary) Nurse Communication: Mobility status PT Visit Diagnosis: Muscle weakness (generalized) (M62.81);Difficulty in walking, not elsewhere classified (R26.2);Unsteadiness on feet (R26.81)     Time: 9924-2683 PT Time Calculation (min) (ACUTE ONLY): 18 min  Charges:  $Therapeutic Activity: 8-22 mins                     Shylo Dillenbeck Conception Chancy, PT Acute Rehabilitation Services Pager [phone]  Office [phone]    Jule Schlabach D Darla Mcdonald 05/19/2019, 2:05 PM

## 2019-05-19 NOTE — Care Management Important Message (Signed)
Important Message  Patient Details  Name: Russell Dillon MRN: [id number] Date of Birth: July 06, 1946   Medicare Important Message Given:  Yes     [name] 05/19/2019, 1:19 PM

## 2019-05-19 NOTE — Progress Notes (Signed)
ANTICOAGULATION CONSULT NOTE - Follow Up Consult  Pharmacy Consult for Warfarin Indication: atrial fibrillation  No Known Allergies  Patient Measurements: Height: 6' 0.01" (182.9 cm) Weight: 174 lb 13.2 oz (79.3 kg) IBW/kg (Calculated) : 77.62  Vital Signs: Temp: 97.4 F (36.3 C) (07/08 0511) Temp Source: Oral (07/08 0511) BP: 112/72 (07/08 0511) Pulse Rate: 109 (07/08 0511)  Labs: Recent Labs    05/17/19 0358 05/18/19 0421 05/19/19 0247  HGB 11.6*  --   --   HCT 37.0*  --   --   PLT 186  --   --   LABPROT  --   --  13.7  INR  --   --  1.1  HEPARINUNFRC 0.39 0.67  --   CREATININE 0.88 0.82  --     Estimated Creatinine Clearance: 86.7 mL/min (by C-G formula based on SCr of 0.82 mg/dL).   Assessment: Patient is a 73 yo male with PMH of chronic Afib on warfarin, HTN, HLD, diastolic dysfunction with aortic valve regurgitation, SBO, BPH, and dementia who was admitted with abdominal pain. He is S/p small bowel resection on 05/12/19. INR was therapeutic on admit. Warfarin reversed with Vitamin K and FFP on 6/27 and 6/28 but restarted on 07/07. Warfarin PTA dose was 2.5 mg daily. Patient has a CHA2DS2-VASc score of 2. INR currently subtherapeutic at 1.1 after dose of Warfarin 5 mg x 1 on 07/07. Hg/Hct are stable (11.6/37) Platelets WNLs (186) Patient may have trouble absorbing warfarin due to bowel resection.  Goal of Therapy:  INR 2-3 Monitor platelets by anticoagulation protocol: Yes   Plan:  Give warfarin 5 mg x 1 dose Primary team added SQ Heparin until INR therapeutic. Monitor daily PT/INR  Sherren Kerns, PharmD PGY1 Acute Care Pharmacy Resident [phone] 05/19/2019,11:29 AM

## 2019-05-19 NOTE — Progress Notes (Signed)
Occupational Therapy Treatment Patient Details Name: Russell Dillon MRN: [id number] DOB: 24-Dec-1945 Today's Date: 05/19/2019    History of present illness Pt is a 73 y/o male admitted secondary to worsening abdominal pain. Pt found to have SBO and is s/p exploratory laparotomy. Pt also with sepsis secondary to UTI. Pt with ICU stay following hypotension. PMH includes a fib, dementia, HTN, and L patellar tendon repair.    OT comments  Pt assisted to chair via lateral scoot with +2 moderate assistance with maximum encouragement. Wife present for session. Goal is to remain up in chair x 2 hours as tolerated. Pt is now participating in self feeding. Wife continues to plan to take pt home.  Follow Up Recommendations  Home health OT;Supervision/Assistance - 24 hour(HH aide)    Equipment Recommendations  None recommended by OT    Recommendations for Other Services      Precautions / Restrictions Precautions Precautions: Fall Precaution Comments: L knee sensitive to touch Restrictions Weight Bearing Restrictions: No LLE Weight Bearing: Weight bearing as tolerated       Mobility Bed Mobility Overal bed mobility: Needs Assistance Bed Mobility: Rolling;Sidelying to Sit Rolling: Max assist Sidelying to sit: +2 for physical assistance;Mod assist       General bed mobility comments: used log roll technique to minimize abdominal pain, assist for LEs over EOB and to raise trunk, HOB up  Transfers Overall transfer level: Needs assistance   Transfers: Lateral/Scoot Transfers          Lateral/Scoot Transfers: +2 physical assistance;Mod assist General transfer comment: pt assisting while therapist counted to 3    Balance Overall balance assessment: Needs assistance Sitting-balance support: Feet supported;Single extremity supported;Bilateral upper extremity supported Sitting balance-Leahy Scale: Fair Sitting balance - Comments: pt attempting to return to supine                                   ADL either performed or assessed with clinical judgement   ADL   Eating/Feeding: Set up;Sitting Eating/Feeding Details (indicate cue type and reason): pt on TPN and some PO                                         Vision       Perception     Praxis      Cognition Arousal/Alertness: Awake/alert Behavior During Therapy: Flat affect;Agitated Overall Cognitive Status: History of cognitive impairments - at baseline                                          Exercises     Shoulder Instructions       General Comments      Pertinent Vitals/ Pain       Pain Assessment: Faces Faces Pain Scale: Hurts little more Pain Location: L knee and abdomen Pain Descriptors / Indicators: Grimacing;Guarding Pain Intervention(s): Repositioned;Monitored during session  Home Living                                          Prior Functioning/Environment  Frequency  Min 1X/week        Progress Toward Goals  OT Goals(current goals can now be found in the care plan section)  Progress towards OT goals: Progressing toward goals  Acute Rehab OT Goals Patient Stated Goal: per wife, to return home at d/c  OT Goal Formulation: With patient/family Time For Goal Achievement: 05/28/19 Potential to Achieve Goals: [place] Discharge plan remains appropriate    Co-evaluation    PT/OT/SLP Co-Evaluation/Treatment: Yes Reason for Co-Treatment: For patient/therapist safety;Necessary to address cognition/behavior during functional activity   OT goals addressed during session: Strengthening/ROM      AM-PAC OT "6 Clicks" Daily Activity     Outcome Measure   Help from another person eating meals?: A Little Help from another person taking care of personal grooming?: Total Help from another person toileting, which includes using toliet, bedpan, or urinal?: Total Help from another person bathing  (including washing, rinsing, drying)?: Total Help from another person to put on and taking off regular upper body clothing?: Total Help from another person to put on and taking off regular lower body clothing?: Total 6 Click Score: 8    End of Session    OT Visit Diagnosis: Muscle weakness (generalized) (M62.81);Other symptoms and signs involving cognitive function   Activity Tolerance Treatment limited secondary to agitation   Patient Left in chair;with call bell/phone within reach;with family/visitor present   Nurse Communication          Time: 3953-2023 OT Time Calculation (min): 16 min  Charges: OT General Charges $OT Visit: 1 Visit  [name], OTR/L Acute Rehabilitation Services Pager: [phone] Office: [phone]   [name] 05/19/2019, 12:51 PM

## 2019-05-20 LAB — COMPREHENSIVE METABOLIC PANEL
ALT: 66 U/L — ABNORMAL HIGH (ref 0–44)
AST: 20 U/L (ref 15–41)
Albumin: 2.9 g/dL — ABNORMAL LOW (ref 3.5–5.0)
Alkaline Phosphatase: 116 U/L (ref 38–126)
Anion gap: 10 (ref 5–15)
BUN: 44 mg/dL — ABNORMAL HIGH (ref 8–23)
CO2: 21 mmol/L — ABNORMAL LOW (ref 22–32)
Calcium: 9.1 mg/dL (ref 8.9–10.3)
Chloride: 105 mmol/L (ref 98–111)
Creatinine, Ser: 0.75 mg/dL (ref 0.61–1.24)
GFR calc Af Amer: >60 mL/min (ref >60)
GFR calc non Af Amer: >60 mL/min (ref >60)
Glucose, Bld: 138 mg/dL — ABNORMAL HIGH (ref 70–99)
Potassium: 3.8 mmol/L (ref 3.5–5.1)
Sodium: 136 mmol/L (ref 135–145)
Total Bilirubin: 0.6 mg/dL (ref 0.3–1.2)
Total Protein: 6.9 g/dL (ref 6.5–8.1)

## 2019-05-20 LAB — CBC
HCT: 29 % — ABNORMAL LOW (ref 39.0–52.0)
Hemoglobin: 9.2 g/dL — ABNORMAL LOW (ref 13.0–17.0)
MCH: 26.9 pg (ref 26.0–34.0)
MCHC: 31.7 g/dL (ref 30.0–36.0)
MCV: 84.8 fL (ref 80.0–100.0)
Platelets: 223 10*3/uL (ref 150–400)
RBC: 3.42 MIL/uL — ABNORMAL LOW (ref 4.22–5.81)
RDW: 21.1 % — ABNORMAL HIGH (ref 11.5–15.5)
WBC: 8.7 10*3/uL (ref 4.0–10.5)
nRBC: 0 % (ref 0.0–0.2)

## 2019-05-20 LAB — MAGNESIUM: Magnesium: 1.7 mg/dL (ref 1.7–2.4)

## 2019-05-20 LAB — GLUCOSE, CAPILLARY
Glucose-Capillary: 103 mg/dL — ABNORMAL HIGH (ref 70–99)
Glucose-Capillary: 127 mg/dL — ABNORMAL HIGH (ref 70–99)
Glucose-Capillary: 132 mg/dL — ABNORMAL HIGH (ref 70–99)

## 2019-05-20 LAB — PROTIME-INR
INR: 1.1 (ref 0.8–1.2)
Prothrombin Time: 14.1 seconds (ref 11.4–15.2)

## 2019-05-20 LAB — PHOSPHORUS: Phosphorus: 3.5 mg/dL (ref 2.5–4.6)

## 2019-05-20 MED ORDER — DOCUSATE SODIUM 100 MG PO CAPS: 100.0000 mg | ORAL_CAPSULE | Freq: Two times a day (BID) | ORAL | 0 refills | Status: AC

## 2019-05-20 MED ORDER — POLYETHYLENE GLYCOL 3350 17 GM/SCOOP PO POWD: 17.0000 g | Freq: Every day | ORAL | 0 refills | Status: AC

## 2019-05-20 MED ORDER — DILTIAZEM HCL ER COATED BEADS 120 MG PO CP24
120.0000 mg | ORAL_CAPSULE | Freq: Every day | ORAL | Status: DC
  Administered 2019-05-20: 120 mg via ORAL
  Filled 2019-05-20: qty 1

## 2019-05-20 MED ORDER — INSULIN ASPART 100 UNIT/ML ~~LOC~~ SOLN: 0.0000 [IU] | Freq: Three times a day (TID) | SUBCUTANEOUS | Status: DC

## 2019-05-20 NOTE — Progress Notes (Signed)
PHARMACY - ADULT TOTAL PARENTERAL NUTRITION CONSULT NOTE   Pharmacy Consult for TPN Indication: prolonged ileus  Patient Measurements: Height: 6' 0.01" (182.9 cm) Weight: 174 lb 13.2 oz (79.3 kg) IBW/kg (Calculated) : 77.62 TPN AdjBW (KG): 80.1 Body mass index is 23.71 kg/m.   Assessment:  Admitted with abdominal pain. Patient has history of prior small bowel resection and appendectomy in 2004. CT on admission shows small bowel obstruction. He was first started on conservative management. Placement of NG tube was initially held due therapeutic INR of 3. On 6/26 warfarin was reversed and NG was placed for decompression. After a few days of no improvement, patient went to OR for small bowel resection with resultant lysis of adhesions.  GI: s/p small bowel resection 05/11/19. Pre-albumin 15.1 > 28. Was NPO for 8 days prior to TPN. Abd Xray - moderate stool burden - docusate, miralax, s/p Dulcolax supp. LBM 7/8. Tolerating full liquids Endo: CBGs controlled <150 Insulin requirements in the past 24 hours: 1 units Lytes: CO2 up to 21, K down to 3.8, Mag down to 1.7, others WNL Renal: SCr 0.75 stable. BUN up to 44. UOP 0.3 mL/kg/hr per documentation. Finasteride Pulm: RA Cards: BP soft. AFib w/ RVR 7/8 - Diltiazem drip resumed. Warfarin PTA - resumed + heparin SQ, INR 1.1 Hepatobil: AST normalized / ALT trend down to 66, Tbili / TG WNL Neuro: dementia at baseline, on Aricept, lithium ID: Completed empiric course of Cefepime. WBC WNL. Afebrile. Procalcitonin neg  TPN Access: PICC placed 7/1 TPN start date: 05/12/19  Nutritional Goals (per RD recommendation on 05/17/19):   KCal: 2200-2400 Protein: 105-120 g Fluid: > 2.2 L  Current Nutrition:  TPN Feeding supplement TID (Ensure Enlive) BID (each provides 290 kCal + 9 g protein) - 3 doses charted yesterday soft diet - tolerating per Surgery but not taking in much  Plan:  D/c TPN today per Surgery - Taper TPN to 1/2 rate x 2 hrs, then off  (discussed plan with RN by phone) Change SSI to ACHS D/c TPN labs/orders   Elicia Lamp, PharmD, BCPS Please check AMION for all [place] contact numbers Clinical Pharmacist 05/20/2019 8:20 AM

## 2019-05-20 NOTE — Discharge Instructions (Addendum)
MIDLINE WOUND CARE: - midline dressing to be changed twice daily - supplies: sterile saline, kerlix, scissors, ABD pads, tape  - remove dressing and all packing carefully, moistening with sterile saline as needed to avoid packing/internal dressing sticking to the wound. - clean edges of skin around the wound with water/gauze, making sure there is no tape debris or leakage left on skin that could cause skin irritation or breakdown. - dampen and clean kerlix with sterile saline and pack wound from wound base to skin level, making sure to take note of any possible areas of wound tracking, tunneling and packing appropriately. Wound can be packed loosely. Trim kerlix to size if a whole kerlix is not required. - cover wound with a dry ABD pad and secure with tape.  - write the date/time on the dry dressing/tape to better track when the last dressing change occurred. - change dressing as needed if leakage occurs, wound gets contaminated, or patient requests to shower. - patient may shower daily with wound open and following the shower the wound should be dried and a clean dressing placed.     [facility], [place] [phone]  OPEN ABDOMINAL SURGERY: POST OP INSTRUCTIONS  Always review your discharge instruction sheet given to you by the facility where your surgery was performed.  IF YOU HAVE DISABILITY OR FAMILY LEAVE FORMS, YOU MUST BRING THEM TO THE OFFICE FOR PROCESSING.  PLEASE DO NOT GIVE THEM TO YOUR DOCTOR.  1. A prescription for pain medication may be given to you upon discharge.  Take your pain medication as prescribed, if needed.  If narcotic pain medicine is not needed, then you may take acetaminophen (Tylenol) or ibuprofen (Advil) as needed. 2. Take your usually prescribed medications unless otherwise directed. 3. If you need a refill on your pain medication, please contact your pharmacy. They will contact our office to request authorization.  Prescriptions will not be  filled after 5pm or on week-ends. 4. You should follow a light diet the first few days after arrival home, such as soup and crackers, pudding, etc.unless your doctor has advised otherwise. A high-fiber, low fat diet can be resumed as tolerated.   Be sure to include lots of fluids daily. Most patients will experience some swelling and bruising on the chest and neck area.  Ice packs will help.  Swelling and bruising can take several days to resolve 5. Most patients will experience some swelling and bruising in the area of the incision. Ice pack will help. Swelling and bruising can take several days to resolve..  6. It is common to experience some constipation if taking pain medication after surgery.  Increasing fluid intake and taking a stool softener will usually help or prevent this problem from occurring.  A mild laxative (Milk of Magnesia or Miralax) should be taken according to package directions if there are no bowel movements after 48 hours. 7. ACTIVITIES:  You may resume regular (light) daily activities beginning the next day--such as daily self-care, walking, climbing stairs--gradually increasing activities as tolerated.  You may have sexual intercourse when it is comfortable.  Refrain from any heavy lifting or straining until approved by your doctor. a. You may drive when you no longer are taking prescription pain medication, you can comfortably wear a seatbelt, and you can safely maneuver your car and apply brakes 8. You should see your doctor in the office for a follow-up appointment approximately two weeks after your surgery.  Make sure that  you call for this appointment within a day or two after you arrive home to insure a convenient appointment time. OTHER INSTRUCTIONS:  _____________________________________________________________ _____________________________________________________________  WHEN TO CALL YOUR DOCTOR: 1. Fever over 101.0 2. Inability to urinate 3. Nausea and/or  vomiting 4. Extreme swelling or bruising 5. Continued bleeding from incision. 6. Increased pain, redness, or drainage from the incision. 7. Difficulty swallowing or breathing 8. Muscle cramping or spasms. 9. Numbness or tingling in hands or feet or around lips.  The clinic staff is available to answer your questions during regular business hours.  Please dont hesitate to call and ask to speak to one of the nurses if you have concerns.  For further questions, please visit [email or web address]   ============================================================================== Information on my medicine - Coumadin   (Warfarin)  This medication education was reviewed with me or my healthcare representative as part of my discharge preparation.    Why was Coumadin prescribed for you? Coumadin was prescribed for you because you have a blood clot or a medical condition that can cause an increased risk of forming blood clots. Blood clots can cause serious health problems by blocking the flow of blood to the heart, lung, or brain. Coumadin can prevent harmful blood clots from forming. As a reminder your indication for Coumadin is:   Stroke Prevention Because Of Atrial Fibrillation  What test will check on my response to Coumadin? While on Coumadin (warfarin) you will need to have an INR test regularly to ensure that your dose is keeping you in the desired range. The INR (international normalized ratio) number is calculated from the result of the laboratory test called prothrombin time (PT).  If an INR APPOINTMENT HAS NOT ALREADY BEEN MADE FOR YOU please schedule an appointment to have this lab work done by your health care provider within 7 days. Your INR goal is usually a number between:  2 to 3 or your provider may give you a more narrow range like 2-2.5.  Ask your health care provider during an office visit what your goal INR is.  What  do you need to  know  About  COUMADIN? Take  Coumadin (warfarin) exactly as prescribed by your healthcare provider about the same time each day.  DO NOT stop taking without talking to the doctor who prescribed the medication.  Stopping without other blood clot prevention medication to take the place of Coumadin may increase your risk of developing a new clot or stroke.  Get refills before you run out.  What do you do if you miss a dose? If you miss a dose, take it as soon as you remember on the same day then continue your regularly scheduled regimen the next day.  Do not take two doses of Coumadin at the same time.  Important Safety Information A possible side effect of Coumadin (Warfarin) is an increased risk of bleeding. You should call your healthcare provider right away if you experience any of the following: ? Bleeding from an injury or your nose that does not stop. ? Unusual colored urine (red or dark brown) or unusual colored stools (red or black). ? Unusual bruising for unknown reasons. ? A serious fall or if you hit your head (even if there is no bleeding).  Some foods or medicines interact with Coumadin (warfarin) and might alter your response to warfarin. To help avoid this: ? Eat a balanced diet, maintaining a consistent amount of Vitamin K. ? Notify your provider about major diet changes you plan  to make. ? Avoid alcohol or limit your intake to 1 drink for women and 2 drinks for men per day. (1 drink is 5 oz. wine, 12 oz. beer, or 1.5 oz. liquor.)  Make sure that ANY health care provider who prescribes medication for you knows that you are taking Coumadin (warfarin).  Also make sure the healthcare provider who is monitoring your Coumadin knows when you have started a new medication including herbals and non-prescription products.  Coumadin (Warfarin)  Major Drug Interactions  Increased Warfarin Effect Decreased Warfarin Effect  Alcohol (large quantities) Antibiotics (esp. Septra/Bactrim, Flagyl, Cipro) Amiodarone  (Cordarone) Aspirin (ASA) Cimetidine (Tagamet) Megestrol (Megace) NSAIDs (ibuprofen, naproxen, etc.) Piroxicam (Feldene) Propafenone (Rythmol SR) Propranolol (Inderal) Isoniazid (INH) Posaconazole (Noxafil) Barbiturates (Phenobarbital) Carbamazepine (Tegretol) Chlordiazepoxide (Librium) Cholestyramine (Questran) Griseofulvin Oral Contraceptives Rifampin Sucralfate (Carafate) Vitamin K   Coumadin (Warfarin) Major Herbal Interactions  Increased Warfarin Effect Decreased Warfarin Effect  Garlic Ginseng Ginkgo biloba Coenzyme Q10 Green tea St. Johns wort    Coumadin (Warfarin) FOOD Interactions  Eat a consistent number of servings per week of foods HIGH in Vitamin K (1 serving =  cup)  Collards (cooked, or boiled & drained) Kale (cooked, or boiled & drained) Mustard greens (cooked, or boiled & drained) Parsley *serving size only =  cup Spinach (cooked, or boiled & drained) Swiss chard (cooked, or boiled & drained) Turnip greens (cooked, or boiled & drained)  Eat a consistent number of servings per week of foods MEDIUM-HIGH in Vitamin K (1 serving = 1 cup)  Asparagus (cooked, or boiled & drained) Broccoli (cooked, boiled & drained, or raw & chopped) Brussel sprouts (cooked, or boiled & drained) *serving size only =  cup Lettuce, raw (green leaf, endive, romaine) Spinach, raw Turnip greens, raw & chopped   These websites have more information on Coumadin (warfarin):  FailFactory.se; VeganReport.com.au;

## 2019-05-20 NOTE — Progress Notes (Signed)
Patient will be transported home via [place]. Wife reviewed discharge paperwork with Probation officer.  Nsg Discharge Note  Admit Date:  05/05/2019 Discharge date: 05/20/2019   Russell Dillon to be D/C'd Home per MD order.  AVS completed.  Copy for chart, and copy for patient signed, and dated. Patient/caregiver able to verbalize understanding.  Discharge Medication: Allergies as of 05/20/2019   No Known Allergies     Medication List    TAKE these medications   acetaminophen 325 MG tablet Commonly known as: TYLENOL Take 2 tablets (650 mg total) by mouth every 6 (six) hours as needed for mild pain, fever or headache.   allopurinol 100 MG tablet Commonly known as: ZYLOPRIM Take 1 tablet (100 mg total) by mouth daily.   B-12 Compliance Injection 1000 MCG/ML Kit Generic drug: Cyanocobalamin Inject 1,000 mcg as directed every 30 (thirty) days.   colchicine 0.6 MG tablet Take 1 tablet (0.6 mg total) by mouth 2 (two) times daily.   diltiazem 120 MG 24 hr capsule Commonly known as: Cardizem CD Take 1 capsule (120 mg total) by mouth daily.   docusate sodium 100 MG capsule Commonly known as: COLACE Take 1 capsule (100 mg total) by mouth 2 (two) times daily. What changed: when to take this   donepezil 10 MG tablet Commonly known as: ARICEPT Take 1 tablet (10 mg total) by mouth daily. What changed: when to take this   feeding supplement (ENSURE ENLIVE) Liqd Take 237 mLs by mouth 2 (two) times daily between meals. What changed:   when to take this  reasons to take this  additional instructions   finasteride 5 MG tablet Commonly known as: PROSCAR Take 1 tablet (5 mg total) by mouth every morning.   HYDROcodone-acetaminophen 5-325 MG tablet Commonly known as: NORCO/VICODIN Take 1-2 tablets by mouth every 4 (four) hours as needed for moderate pain (pain score 4-6).   ICAPS AREDS 2 PO Take 1 capsule by mouth daily.   lithium carbonate 150 MG capsule TAKE 1 CAPSULE(150 MG) BY  MOUTH AT BEDTIME What changed: See the new instructions.   OMEGA-3 FISH OIL PO Take 1 capsule by mouth daily with breakfast.   polyethylene glycol powder 17 GM/SCOOP powder Commonly known as: GLYCOLAX/MIRALAX Take 17 g by mouth daily. What changed:   when to take this  reasons to take this   Vitamin D-3 25 MCG (1000 UT) Caps Take 1,000 Units by mouth 2 (two) times a day.   warfarin 2.5 MG tablet Commonly known as: Coumadin Take as directed. If you are unsure how to take this medication, talk to your nurse or doctor. Original instructions: Take 1 tablet (2.5 mg total) by mouth daily. What changed: when to take this       Discharge Assessment: Vitals:   05/20/19 0937 05/20/19 1216  BP: 106/65 111/60  Pulse: 88 92  Resp: 13   Temp:  99.1 F (37.3 C)  SpO2: 94%    Skin clean, dry and intact without evidence of skin break down, no evidence of skin tears noted. IV catheter discontinued intact. Site without signs and symptoms of complications - no redness or edema noted at insertion site, patient denies c/o pain - only slight tenderness at site.  Dressing with slight pressure applied.  D/c Instructions-Education: Discharge instructions given to patient/family with verbalized understanding. D/c education completed with patient/family including follow up instructions, medication list, d/c activities limitations if indicated, with other d/c instructions as indicated by MD - patient able to verbalize  understanding, all questions fully answered. Patient instructed to return to ED, call 911, or call MD for any changes in condition.  Patient escorted via [place], and D/C home via private auto.  Erasmo Leventhal, RN 05/20/2019 2:58 PM

## 2019-05-20 NOTE — TOC Initial Note (Signed)
Transition of Care [facility]) - Initial/Assessment Note    Patient Details  Name: Russell Dillon MRN: [id number] Date of Birth: 15-Jan-1946  Transition of Care [facility]) CM/SW Contact:    Benard Halsted, LCSW Phone Number: 05/20/2019, 11:00 AM  Clinical Narrative:                 CSW spoke with patient's wife and alerted her that home health has been resumed with [place]. She requests patient be transported by [facility].   Expected Discharge Plan: [place] Barriers to Discharge: No Barriers Identified   Patient Goals and CMS Choice Patient states their goals for this hospitalization and ongoing recovery are:: Return CMS Medicare.gov Compare Post Acute Care list provided to:: Patient Choice offered to / list presented to : NA(Already set up with [facility])  Expected Discharge Plan and Services Expected Discharge Plan: [place] In-house Referral: [facility] Discharge Planning Services: CM Consult Post Acute Care Choice: [place] arrangements for the past 2 months: Single Family Home Expected Discharge Date: 05/20/19                         HH Arranged: RN, PT, OT [place] Agency: [place] ([place]) Date [place]: 05/20/19 Time [place]: 20 Representative spoke with at [place]: Butch Penny  Prior Living Arrangements/Services Living arrangements for the past 2 months: [place] with:: Spouse Patient language and need for interpreter reviewed:: Yes Do you feel safe going back to the place where you live?: Yes      Need for Family Participation in Patient Care: Yes (Comment) Care giver support system in place?: Yes (comment) Current home services: Home PT, Home OT, Home RN, Homehealth aide Criminal Activity/Legal Involvement Pertinent to Current Situation/Hospitalization: No - Comment as needed  Activities of Daily [place] Devices/Equipment: Wheelchair, Transfer board ADL  Screening (condition at time of admission) Patient's cognitive ability adequate to safely complete daily activities?: No Is the patient deaf or have difficulty hearing?: No Does the patient have difficulty seeing, even when wearing glasses/contacts?: No Does the patient have difficulty concentrating, remembering, or making decisions?: Yes Patient able to express need for assistance with ADLs?: Yes Does the patient have difficulty dressing or bathing?: Yes Independently performs ADLs?: No Does the patient have difficulty walking or climbing stairs?: Yes Weakness of Legs: Both Weakness of Arms/Hands: None  Permission Sought/Granted   Permission granted to share information with : Yes, Verbal Permission Granted  Share Information with NAME: [name]  Permission granted to share info w AGENCY: [facility]  Permission granted to share info w Relationship: Spouse  Permission granted to share info w Contact Information: [phone]  Emotional Assessment Appearance:: Appears stated age Attitude/Demeanor/Rapport: Unable to Assess Affect (typically observed): Unable to Assess Orientation: : Oriented to Self Alcohol / Substance Use: Not Applicable Psych Involvement: No (comment)  Admission diagnosis:  SBO (small bowel obstruction) (HCC) [K56.609] SIRS (systemic inflammatory response syndrome) (HCC) [R65.10] Acute cystitis with hematuria [N30.01] Sepsis secondary to UTI ([place]) [A41.9, N39.0] Patient Active Problem List   Diagnosis Date Noted  . Protein-calorie malnutrition, severe 05/18/2019  . Sepsis secondary to UTI ([place]) 05/05/2019  . AKI (acute kidney injury) ([place]) 05/05/2019  . Transient hypotension 05/05/2019  . Sepsis, Gram negative ([place]) 03/18/2019  . Hypoalbuminemia   . Leukocytosis   . Hyperglycemia   . Acute on chronic anemia   . Rupture of left  patellar tendon 02/15/2019  . Postoperative pain   . Benign prostatic hyperplasia   . Bipolar 1 disorder ([place]) 02/14/2019  . Dementia  without behavioral disturbance ([place]) 02/14/2019  . Patellar tendon rupture, left, initial encounter 02/10/2019  . SIRS (systemic inflammatory response syndrome) ([place]) 02/09/2019  . Osteomyelitis ([place]) 02/09/2019  . Acute blood loss anemia 02/09/2019  . Aortic valve regurgitation 10/16/2018  . Encounter for therapeutic drug monitoring 12/02/2013  . SBO (small bowel obstruction) ([place]) 07/08/2013  . Hyperlipidemia 12/20/2011  . HTN (hypertension) 06/04/2011  . Atrial fibrillation, chronic 03/01/2011  . Long term (current) use of anticoagulants 03/01/2011  . GERD 10/11/2009  . DIVERTICULOSIS OF COLON 10/11/2009   PCP:  Marton Redwood, MD Pharmacy:   [place], [facility] [address] [place] [address] [place] [address] Phone: [phone] Fax: [phone]  [id number] [place], [place] [facility] [address] [place] [address] Phone: [phone] Fax: [phone]     Social Determinants of Health (SDOH) Interventions    Readmission Risk Interventions Readmission Risk Prevention Plan 03/22/2019 02/15/2019  Transportation Screening Complete Complete  PCP or Specialist Appt within 5-7 Days - Complete  PCP or Specialist Appt within 3-5 Days Complete -  Home Care Screening - Complete  Medication Review (RN CM) - Complete  HRI or Home Care Consult Complete -  Social Work Consult for Recovery Care Planning/Counseling Complete -  Palliative Care Screening Not Complete -  Palliative Care Screening Not Complete Comments NA -  Medication Review (RN Care Manager) Complete -  Some recent data might be hidden

## 2019-05-20 NOTE — Progress Notes (Signed)
Wife aware that socks were possibly misplaced.

## 2019-05-20 NOTE — Progress Notes (Signed)
Nutrition Follow-up  DOCUMENTATION CODES:   Severe malnutrition in context of chronic illness  INTERVENTION:   -TPN management per pharmacy; plan to d/c today -Continue Magic cup TID with meals, each supplement provides 290 kcal and 9 grams of protein -Continue Ensure Enlive po BID, each supplement provides 350 kcal and 20 grams of protein  NUTRITION DIAGNOSIS:   Severe Malnutrition related to chronic illness(dementia) as evidenced by percent weight loss, severe fat depletion, severe muscle depletion.  Ongoing  GOAL:   Patient will meet greater than or equal to 90% of their needs  Progressing   MONITOR:   PO intake, Supplement acceptance, Diet advancement, Labs, Weight trends, Skin, I & O's  REASON FOR ASSESSMENT:   Low [place]    ASSESSMENT:   73 year old male with medical history significant of chronic a-fib, HTN, HLD, dementia admitted with SBO and cholelithiasis.  6/26- NGT placed to low intermittent suction  6/30- S/p ex lap with lysis of adhesions 7/1- PICC placed, TPN to be initiated 7/2- transferred from ICU to PCU 7/5- NGT removed 7/6- advanced to clear liquids 7/7- advanced to full liquids 7/8- advanced to soft diet  Reviewed I/O's: 400 ml x 24 hours and -6.6 L since 05/06/19  UOP: 600 ml x 24 hours  Pt remains with variable intake; noted meal completion 0-50%. Per general surgery notes, pt's intake improved when wife is present. Pt is accepting Ensure supplements.   Per pharmacy note, plan to d/c TPN today.   Labs reviewed: K, Mg, and Phos WDL. CBGS: 119-132 (inpatient orders for glycemic control are 0-9 units insulin aspart TID with meals).   Diet Order:   Diet Order            Diet - low sodium heart healthy        DIET SOFT Room service appropriate? Yes; Fluid consistency: Thin  Diet effective now              EDUCATION NEEDS:   No education needs have been identified at this time  Skin:  Skin Assessment: Skin Integrity  Issues: Skin Integrity Issues:: Other (Comment), Incisions Incisions: closed abdomen Other: MASD to bilateral buttocks  Last BM:  05/19/19  Height:   Ht Readings from Last 1 Encounters:  05/11/19 6' 0.01" (1.829 m)    Weight:   Wt Readings from Last 1 Encounters:  05/20/19 79.3 kg    Ideal Body Weight:  80.91 kg  BMI:  Body mass index is 23.71 kg/m.  Estimated Nutritional Needs:   Kcal:  2200-2400  Protein:  105-120 grams  Fluid:  > 2.2 L    Lucky Alverson A. Jimmye Norman, RD, LDN, [place] Registered Dietitian II Certified Diabetes Care and Education Specialist Pager: [phone] After hours Pager: [phone]

## 2019-05-20 NOTE — TOC Transition Note (Signed)
Transition of Care [facility]) - CM/SW Discharge Note   Patient Details  Name: Russell Dillon MRN: [id number] Date of Birth: October 29, 1946  Transition of [facility]) CM/SW Contact:  Benard Halsted, LCSW Phone Number: 05/20/2019, 2:14 PM   Clinical Narrative:    Patient will DC to: Home Anticipated DC date: 05/20/19 Family notified: Spouse Transport by: Corey Harold   Per MD patient ready for DC to . RN, patient, and patient's family, notified of DC.  Ambulance transport requested for patient.   CSW will sign off for now as social work intervention is no longer needed. Please consult [place] again if new needs arise.  Cedric Fishman, LCSW Clinical Social Worker [phone]    Final next level of care: Home w Home Health Services Barriers to Discharge: No Barriers Identified   Patient Goals and CMS Choice Patient states their goals for this hospitalization and ongoing recovery are:: Return CMS Medicare.gov Compare Post Acute Care list provided to:: Patient Choice offered to / list presented to : NA(Already set up with [facility])  Discharge Placement                Patient to be transferred to facility by: [place] Name of family member notified: Spouse [name] Patient and family notified of of transfer: 05/20/19  Discharge Plan and Services In-house Referral: [facility] Discharge Planning Services: CM Consult Post Acute Care Choice: Home Health                    HH Arranged: RN, PT, OT [facility] Agency: [facility] [place] ([place]) Date HH Agency Contacted: 05/20/19 Time [place]: 1055 Representative spoke with at [place]: [place] ([place]) Interventions     Readmission Risk Interventions Readmission Risk Prevention Plan 03/22/2019 02/15/2019  Transportation Screening Complete Complete  PCP or Specialist Appt within 5-7 Days - Complete  PCP or Specialist Appt within 3-5 Days Complete -  Home Care Screening - Complete  Medication Review (RN CM) - Complete   HRI or Home Care Consult Complete -  Social Work Consult for Recovery Care Planning/Counseling Complete -  Palliative Care Screening Not Complete -  Palliative Care Screening Not Complete Comments NA -  Medication Review (RN Care Manager) Complete -  Some recent data might be hidden

## 2019-05-20 NOTE — Progress Notes (Deleted)
ANTICOAGULATION CONSULT NOTE - Follow Up Consult  Pharmacy Consult for Warfarin Indication: atrial fibrillation  No Known Allergies  Patient Measurements: Height: 6' 0.01" (182.9 cm) Weight: 174 lb 13.2 oz (79.3 kg) IBW/kg (Calculated) : 77.62  Vital Signs: Temp: 97.6 F (36.4 C) (07/09 0812) Temp Source: Oral (07/09 0812) BP: 106/65 (07/09 0937) Pulse Rate: 88 (07/09 0937)  Labs: Recent Labs    05/18/19 0421 05/19/19 0247 05/20/19 0447  HGB  --   --  9.2*  HCT  --   --  29.0*  PLT  --   --  223  LABPROT  --  13.7 14.1  INR  --  1.1 1.1  HEPARINUNFRC 0.67  --   --   CREATININE 0.82  --  0.75    Estimated Creatinine Clearance: 88.9 mL/min (by C-G formula based on SCr of 0.75 mg/dL).   Assessment: Patient is a 73 yo male with PMH of chronic Afib on warfarin, HTN, HLD, diastolic dysfunction with aortic valve regurgitation, SBO, BPH, and dementia who was admitted with abdominal pain. He is S/p small bowel resection on 05/12/19. INR was therapeutic on admit. Warfarin reversed with Vitamin K and FFP on 6/27 and 6/28 but restarted on 07/07 at 5 mg. INR subtherapeutic, 1.1>>1.1 after 5 mg x2 Patient may have trouble absorbing warfarin due to bowel resection. (PTA Warfarin 2.5 mg daily)  Patient has a CHA2DS2-VASc score of 2.  Goal of Therapy:  INR 2-3 Monitor platelets by anticoagulation protocol: Yes   Plan:  Give warfarin 5 mg x 1 today Continue SQ Heparin until INR therapeutic Monitor daily PT/INR   Aailyah Dunbar L. Devin Going, PharmD, [place] PGY1 Pharmacy Resident [place]: [phone]  Mobile: [phone] 05/20/19 10:24 AM  Please check AMION for all [place] phone numbers After 10:00 PM, call the [place] [phone]

## 2019-05-20 NOTE — Progress Notes (Addendum)
[facility] Progress Note  9 Days Post-Op  Subjective: CC-  Per RN patient had 2 BMs yesterday, 1 small and 1 large. Still not eating a lot but tolerating a soft diet. Eats better when his wife is in the room. He denies abdominal pain, nausea, or vomiting today. Required cardizem drip for Afib RVR yesterday.  Objective: Vital signs in last 24 hours: Temp:  [97.6 F (36.4 C)-98.5 F (36.9 C)] 97.6 F (36.4 C) (07/09 0812) Pulse Rate:  [78-104] 91 (07/09 0038) Resp:  [11-21] 21 (07/09 0038) BP: (91-105)/(55-67) 95/59 (07/09 0812) SpO2:  [80 %-100 %] 100 % (07/09 0812) Weight:  [79.3 kg] 79.3 kg (07/09 0500) Last BM Date: 05/19/19  Intake/Output from previous day: 07/08 0701 - 07/09 0700 In: 200 [P.O.:200] Out: 600 [Urine:600] Intake/Output this shift: No intake/output data recorded.  PE: Gen: Alert, NAD, pleasant HEENT: EOM's intact, pupils equal and round Card:irregular Pulm: CTAB, no W/R/R, effort normal Abd: Soft,ND, NT,+BS, open midline incision pink without erythema or drainage Skin: warm and dry    Lab Results:  Recent Labs    05/20/19 0447  WBC 8.7  HGB 9.2*  HCT 29.0*  PLT 223   BMET Recent Labs    05/18/19 0421 05/20/19 0447  NA 135 136  K 4.6 3.8  CL 106 105  CO2 20* 21*  GLUCOSE 117* 138*  BUN 41* 44*  CREATININE 0.82 0.75  CALCIUM 9.4 9.1   PT/INR Recent Labs    05/19/19 0247 05/20/19 0447  LABPROT 13.7 14.1  INR 1.1 1.1   CMP     Component Value Date/Time   NA 136 05/20/2019 0447   NA 135 07/16/2017 1348   K 3.8 05/20/2019 0447   CL 105 05/20/2019 0447   CO2 21 (L) 05/20/2019 0447   GLUCOSE 138 (H) 05/20/2019 0447   BUN 44 (H) 05/20/2019 0447   BUN 13 07/16/2017 1348   CREATININE 0.75 05/20/2019 0447   CALCIUM 9.1 05/20/2019 0447   CALCIUM 8.2 (L) 02/10/2019 0302   PROT 6.9 05/20/2019 0447   ALBUMIN 2.9 (L) 05/20/2019 0447   AST 20 05/20/2019 0447   ALT 66 (H) 05/20/2019 0447   ALKPHOS 116  05/20/2019 0447   BILITOT 0.6 05/20/2019 0447   GFRNONAA >60 05/20/2019 0447   GFRAA >60 05/20/2019 0447   Lipase     Component Value Date/Time   LIPASE 24 05/05/2019 1707       Studies/Results: Dg Abd Portable 1v  Result Date: 05/19/2019 CLINICAL DATA:  Constipation. EXAM: PORTABLE ABDOMEN - 1 VIEW COMPARISON:  Radiograph of May 11, 2019. FINDINGS: The bowel gas pattern is normal. Moderate stool burden is noted. No radio-opaque calculi or other significant radiographic abnormality are seen. IMPRESSION: Moderate stool burden.  No abnormal bowel dilatation is noted. Electronically Signed   By: Marijo Conception M.D.   On: 05/19/2019 08:36    Anti-infectives: Anti-infectives (From admission, onward)   Start     Dose/Rate Route Frequency Ordered Stop   05/06/19 2100  vancomycin (VANCOCIN) 1,250 mg in sodium chloride 0.9 % 250 mL IVPB  Status:  Discontinued     1,250 mg 166.7 mL/hr over 90 Minutes Intravenous Every 24 hours 05/05/19 1849 05/05/19 2057   05/06/19 0800  ceFEPIme (MAXIPIME) 2 g in sodium chloride 0.9 % 100 mL IVPB  Status:  Discontinued     2 g 200 mL/hr over 30 Minutes Intravenous Every 12 hours 05/05/19 1849 05/05/19 2057   05/06/19 0800  ceFEPIme (MAXIPIME) 2 g in sodium chloride 0.9 % 100 mL IVPB  Status:  Discontinued     2 g 200 mL/hr over 30 Minutes Intravenous Every 12 hours 05/05/19 2114 05/11/19 1257   05/05/19 1830  ceFEPIme (MAXIPIME) 2 g in sodium chloride 0.9 % 100 mL IVPB     2 g 200 mL/hr over 30 Minutes Intravenous  Once 05/05/19 1817 05/05/19 2019   05/05/19 1830  metroNIDAZOLE (FLAGYL) IVPB 500 mg     500 mg 100 mL/hr over 60 Minutes Intravenous  Once 05/05/19 1817 05/05/19 2011   05/05/19 1830  vancomycin (VANCOCIN) IVPB 1000 mg/200 mL premix  Status:  Discontinued     1,000 mg 200 mL/hr over 60 Minutes Intravenous  Once 05/05/19 1817 05/05/19 1825   05/05/19 1830  vancomycin (VANCOCIN) 1,500 mg in sodium chloride 0.9 % 500 mL IVPB     1,500  mg 250 mL/hr over 120 Minutes Intravenous  Once 05/05/19 1825 05/05/19 2238       Assessment/Plan Atrial fibrillation - on diltiazem drip, restarting coumadin HTN HLD BPH Dementia Recent left patella tendon repair 02/11/2019 Malnutrition -?prealbumin28 today from15.1, on TPN  POD9elap with loa 6/30- [place] SBO-Hx small bowel obstruction 03/01/19/small bowel resection 2004,Hx appendectomy 2004 -BID wet to dry dressing changes - mobilize, OOB, PT/OT -NG tube removed 7/5  FEN- soft diet VTE- coumadin ID-[id number], afebrile Foley- d/c 7/7 Follow up - Dr. Nat Christen - wife Bauer Ausborn [phone] or [phone]  Plan - Continue soft diet and d/c TPN. Encourage PO intake. Continue bowel regimen with miralax/colace. Continue PT/mobilize as able. Patient stable for discharge from surgical standpoint. I called and updated the patient's wife.   LOS: 15 days    Wellington Hampshire , [facility] 05/20/2019, 8:46 AM Pager: [phone] Mon-Thurs 7:00 am-4:30 pm Fri 7:00 am -11:30 AM Sat-Sun 7:00 am-11:30 am

## 2019-05-20 NOTE — Discharge Summary (Signed)
Physician Discharge Summary  Russell Dillon KGM:[id number] DOB: February 14, 1946 DOA: 05/05/2019  PCP: Marton Redwood, MD  Admit date: 05/05/2019 Discharge date: 05/20/2019  Admitted From: Home Disposition:  Home  Discharge Condition:Stable CODE STATUS:FULL Diet recommendation: Soft diet Brief/Interim Summary:  Patient is a 73 year old male with past medical history of chronic atrial fibrillation on warfarin, hypertension, hyperlipidemia, diastolic dysfunction with aortic valve regurgitation, history of SBO, BPH, dementia who presents with complaints of abdominal pain.  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  He was recently found to have urinary tract infection and was treated with antibiotics and outpatient. CT imaging showed small bowel obstruction with transition point .  General surgery consulted. NGT placed on 05/07/2019. No improvement 5 days later. Pt was taken to OR for LOA. He was hypotensive post operatively and was on 4N ICU with neo drip. Currently off pressors.  General surgery following.  On TPN.  He went into  Afib with RVR on 7/8/9. Started on cardizem drip.  This morning his heart rate was well controlled.  Cardizem drip discontinued.  He has been having bowel movements now.  Surgery cleared him for discharge.  He is stable for discharge home today.  Following problems were addressed during his hospitalization:  SBO:CT imaging showed small bowel obstruction with transition point .  General surgery consulted. NGT placed on 05/07/2019. No improvement 5 days later. Pt was taken to OR for LOA.  This morning he denies any abdomen pain.  He has been having bowel movements  Started on soft diet. Continue bowel regimen .  Afib with RVR/Permanent A. fib on anticoagulation:CHA2DS2-VASc score =at least 3.  Was on heparin drip.  Resumed coumadin.Marland Kitchen He was on oral Cardizem.  He went into A. fib with RVR so started on Cardizem drip.  Currently  heart rate is well controlled.  Continue home dose Cardizem on discharge  Hypotension status post surgery: Currently blood pressure stable.  He was put on pressures after surgery because of hypotension.  Suspected sepsis secondary to urinary tract infection: Urine culture showed diphtheroids.  Antibiotics has been discontinued.  Acute kidney injury: Resolved  Hyperkalemia: Resolved  History of dementia with behavioral disturbance: On Aricept,Lithium at home.  Continue supportive care.  Currently mental status stable.Will resume his home meds.  Debility/deconditioning: Patient evaluated by physical therapy and recommended home health on discharge.At baseline, patient has been nonambulatory since previous patella rupturing March this year.    Discharge Diagnoses:  Principal Problem:   Sepsis secondary to UTI [facility]) Active Problems:   Atrial fibrillation, chronic   Long term (current) use of anticoagulants   SBO (small bowel obstruction) (HCC)   Dementia without behavioral disturbance (HCC)   AKI (acute kidney injury) ([place])   Transient hypotension   Protein-calorie malnutrition, severe    Discharge Instructions  Discharge Instructions    Diet - low sodium heart healthy   Complete by: As directed    Soft diet   Discharge instructions   Complete by: As directed    1)Please follow up with general surgery in the given appointment date. 2)Follow up with your PCP in a week. 3)Take soft diet at home.  Continue  Your  home medicines.   Increase activity slowly   Complete by: As directed      Allergies as of 05/20/2019   No Known Allergies     Medication List    TAKE these medications   acetaminophen 325 MG tablet Commonly known as: TYLENOL  Take 2 tablets (650 mg total) by mouth every 6 (six) hours as needed for mild pain, fever or headache.   allopurinol 100 MG tablet Commonly known as: ZYLOPRIM Take 1 tablet (100 mg total) by mouth daily.   B-12 Compliance  Injection 1000 MCG/ML Kit Generic drug: Cyanocobalamin Inject 1,000 mcg as directed every 30 (thirty) days.   colchicine 0.6 MG tablet Take 1 tablet (0.6 mg total) by mouth 2 (two) times daily.   diltiazem 120 MG 24 hr capsule Commonly known as: Cardizem CD Take 1 capsule (120 mg total) by mouth daily.   docusate sodium 100 MG capsule Commonly known as: COLACE Take 1 capsule (100 mg total) by mouth 2 (two) times daily. What changed: when to take this   donepezil 10 MG tablet Commonly known as: ARICEPT Take 1 tablet (10 mg total) by mouth daily. What changed: when to take this   feeding supplement (ENSURE ENLIVE) Liqd Take 237 mLs by mouth 2 (two) times daily between meals. What changed:   when to take this  reasons to take this  additional instructions   finasteride 5 MG tablet Commonly known as: PROSCAR Take 1 tablet (5 mg total) by mouth every morning.   HYDROcodone-acetaminophen 5-325 MG tablet Commonly known as: NORCO/VICODIN Take 1-2 tablets by mouth every 4 (four) hours as needed for moderate pain (pain score 4-6).   ICAPS AREDS 2 PO Take 1 capsule by mouth daily.   lithium carbonate 150 MG capsule TAKE 1 CAPSULE(150 MG) BY MOUTH AT BEDTIME What changed: See the new instructions.   OMEGA-3 FISH OIL PO Take 1 capsule by mouth daily with breakfast.   polyethylene glycol powder 17 GM/SCOOP powder Commonly known as: GLYCOLAX/MIRALAX Take 17 g by mouth daily. What changed:   when to take this  reasons to take this   Vitamin D-3 25 MCG (1000 UT) Caps Take 1,000 Units by mouth 2 (two) times a day.   warfarin 2.5 MG tablet Commonly known as: Coumadin Take as directed. If you are unsure how to take this medication, talk to your nurse or doctor. Original instructions: Take 1 tablet (2.5 mg total) by mouth daily. What changed: when to take this      Follow-up Information    Rolm Bookbinder, MD. Call.   Specialty: General Surgery Why: We are  working on your appointment, please call to confirm. Please arrive 30 minutes prior to your appointment to check in and fill out paperwork. Bring photo ID and insurance information. Contact information: [place] [address] [phone]        Marton Redwood, MD. Schedule an appointment as soon as possible for a visit in 1 week(s).   Specialty: Internal Medicine Contact information: [address] [place] [address] [phone]          No Known Allergies  Consultations:  General surgery, PCCM   Procedures/Studies: Dg Abd 1 View  Result Date: 05/11/2019 CLINICAL DATA:  Small-bowel obstruction EXAM: ABDOMEN - 1 VIEW COMPARISON:  05/10/2019 FINDINGS: Scattered large and small bowel gas is again identified. Persistent loops of small bowel are again identified and stable. No definitive free air is seen. Gastric catheter remains in the stomach. No definitive acute bony abnormality is noted. IMPRESSION: Mild persistent small bowel dilatation similar to that seen on the previous day. Electronically Signed   By: Inez Catalina M.D.   On: 05/11/2019 07:28   Dg Abd 1 View  Result Date: 05/07/2019 CLINICAL DATA:  Small-bowel obstruction. EXAM: ABDOMEN - 1 VIEW COMPARISON:  05/06/2019.  CT 05/05/2019 FINDINGS: Persistent prominent distended loops of small bowel are noted. Similar findings noted on prior exam. Gastric distention noted. Diaphragms are incompletely imaged limiting evaluation for free air. Stable sclerotic density noted the right ilium. IMPRESSION: Persistent prominently distended loops of small bowel consistent small bowel obstruction again noted. Similar findings noted on prior exam. Gastric distention noted. Electronically Signed   By: Marcello Moores  Register   On: 05/07/2019 10:09   Abd 1 View (kub)  Result Date: 05/06/2019 CLINICAL DATA:  Small bowel obstruction. EXAM: ABDOMEN - 1 VIEW COMPARISON:  CT abdomen and pelvis 05/05/2019 FINDINGS: Again  noted is severe gaseous distension of small bowel and compatible with a high-grade obstruction. The degree of small bowel distension is similar to slightly increased. Limited evaluation for free air on this supine image. Focal sclerosis in the right ilium is compatible with a bone island. There is contrast within the urinary bladder. IMPRESSION: Persistent dilated loops of small bowel compatible with a high-grade small bowel obstruction. Degree of small bowel distension is stable to slightly increased from the recent CT. Electronically Signed   By: Markus Daft M.D.   On: 05/06/2019 09:19   Ct Abdomen Pelvis W Contrast  Result Date: 05/05/2019 CLINICAL DATA:  73 year old male with prior history of small-bowel surgery presenting with abdominal pain. Concern for bowel obstruction. EXAM: CT ABDOMEN AND PELVIS WITH CONTRAST TECHNIQUE: Multidetector CT imaging of the abdomen and pelvis was performed using the standard protocol following bolus administration of intravenous contrast. CONTRAST:  137m OMNIPAQUE IOHEXOL 300 MG/ML  SOLN COMPARISON:  CT of the abdomen pelvis dated 02/08/2019 and chest CT dated 07/08/2013 FINDINGS: Lower chest: Bibasilar linear atelectasis/scarring. The visualized lung bases are otherwise clear. Multi vessel coronary vascular calcification. No intra-abdominal free air. Small fluid in the mesentery in the left hemiabdomen. Hepatobiliary: Small faint low attenuating focus in the right lobe of the liver adjacent the gallbladder (series 3, image 35) is not characterized but may represent a focal area of fatty infiltration. The liver is otherwise unremarkable. Multiple stones noted in the neck of the gallbladder. No pericholecystic fluid. Pancreas: Unremarkable. No pancreatic ductal dilatation or surrounding inflammatory changes. Spleen: Normal in size without focal abnormality. Adrenals/Urinary Tract: The adrenal glands are unremarkable. Mild bilateral renal parenchyma atrophy. There is no  hydronephrosis on either side. Symmetric enhancement and excretion of contrast by both kidneys. The visualized ureters appear unremarkable. Small right bladder diverticula and slight trabeculation of the bladder wall, likely related to chronic bladder outlet obstruction. Stomach/Bowel: There is a small hiatal hernia. There is dilatation of multiple fluid-filled loops of small bowel measuring up to 7.5 cm in caliber. The distal small bowel and terminal ileum are collapsed. A transition point is noted in the left hemiabdomen (series 3, image 53 and coronal series 6, image 46) in similar location as the CT of 07/08/2013. This is most likely related to underlying adhesions. An internal hernia is favored less likely. Clinical correlation is recommended. There is no pneumatosis. The colon is unremarkable as visualized. Appendectomy. Vascular/Lymphatic: Moderate aortoiliac atherosclerotic disease. No portal venous gas. There is no adenopathy. Reproductive: Enlarged prostate gland measuring 7.7 cm in transverse axial diameter. Other: Midline anterior abdominal wall surgical scar. Musculoskeletal: Osteopenia with degenerative changes of the spine. Probable right iliac bone island. Bilateral L5 pars defects with grade 1 L5-S1 anterolisthesis. No acute osseous pathology. IMPRESSION: 1. Small-bowel obstruction with transition zone in the left hemiabdomen similar in  location as the CT of 07/08/2013 and likely related to underlying adhesions. Clinical correlation is recommended. No pneumatosis or portal venous gas. 2. Cholelithiasis. Electronically Signed   By: Anner Crete M.D.   On: 05/05/2019 20:01   Dg Chest Port 1 View  Result Date: 05/12/2019 CLINICAL DATA:  PICC placement. EXAM: PORTABLE CHEST 1 VIEW COMPARISON:  05/05/2019 FINDINGS: The patient is rotated to the left. An enteric tube has been placed and courses into the upper abdomen with tip not imaged. A new right PICC terminates near the superior cavoatrial  junction. The cardiomediastinal silhouette is unchanged. Aortic atherosclerosis is noted. There is persistent slight elevation of the left hemidiaphragm with minimal basilar atelectasis. No confluent airspace opacity, overt pulmonary edema, pleural effusion, pneumothorax is identified. No acute osseous abnormality is seen. IMPRESSION: 1. Right PICC terminates near the superior cavoatrial junction. 2. Minimal left basilar atelectasis. Electronically Signed   By: Logan Bores M.D.   On: 05/12/2019 11:42   Dg Chest Portable 1 View  Result Date: 05/05/2019 CLINICAL DATA:  History of dementia.  Epigastric pain. EXAM: PORTABLE CHEST 1 VIEW COMPARISON:  Chest x-ray 03/23/2019.  CT 02/08/2019 FINDINGS: Mediastinum hilar structures normal. Stable cardiomegaly. No pulmonary venous congestion. Mild bibasilar atelectasis. Stable elevation left hemidiaphragm. Interposition of colon under the left hemidiaphragm. Similar findings noted on prior exam. Gastric and or colonic distention cannot be excluded. Abdominal series suggested for further evaluation. Degenerative change thoracic spine. IMPRESSION: 1. Mild bibasilar atelectasis and stable elevation left hemidiaphragm. No acute infiltrate. Stable cardiomegaly. No pulmonary venous congestion. 2. Interposition of colon under the left hemidiaphragm. Similar finding noted on prior exam. Gastric and or colonic distention cannot be excluded. Abdominal series suggested for further evaluation. Electronically Signed   By: Marcello Moores  Register   On: 05/05/2019 16:57   Dg Abd Portable 1v  Result Date: 05/19/2019 CLINICAL DATA:  Constipation. EXAM: PORTABLE ABDOMEN - 1 VIEW COMPARISON:  Radiograph of May 11, 2019. FINDINGS: The bowel gas pattern is normal. Moderate stool burden is noted. No radio-opaque calculi or other significant radiographic abnormality are seen. IMPRESSION: Moderate stool burden.  No abnormal bowel dilatation is noted. Electronically Signed   By: Marijo Conception M.D.    On: 05/19/2019 08:36   Dg Abd Portable 1v  Result Date: 05/10/2019 CLINICAL DATA:  Small bowel obstruction. EXAM: PORTABLE ABDOMEN - 1 VIEW COMPARISON:  Radiograph May 09, 2019. FINDINGS: Distal tip of nasogastric tube is seen in proximal stomach. Slightly decreased small bowel dilatation is noted suggesting improving obstruction or ileus. IMPRESSION: Slightly decreased small bowel dilatation is noted suggesting improving distal small bowel obstruction or ileus. Electronically Signed   By: Marijo Conception M.D.   On: 05/10/2019 09:51   Dg Abd Portable 1v  Result Date: 05/09/2019 CLINICAL DATA:  Small bowel obstruction EXAM: PORTABLE ABDOMEN - 1 VIEW COMPARISON:  Yesterday FINDINGS: Persistent dilated small bowel with colonic collapse. Stomach is moderately distended. Artifact from EKG leads. When correlated with prior the nasogastric tube is still likely in place with tip at the fundus of the stomach. No concerning mass effect or gas collection. IMPRESSION: Small bowel obstruction without appreciable change from yesterday. Electronically Signed   By: Monte Fantasia M.D.   On: 05/09/2019 07:07   Dg Abd Portable 1v-small Bowel Obstruction Protocol-initial, 8 Hr Delay  Result Date: 05/08/2019 CLINICAL DATA:  Small-bowel obstruction EXAM: PORTABLE ABDOMEN - 1 VIEW COMPARISON:  None. FINDINGS: The enteric tube terminates over the gastric fundus. The stomach appears to  be partially filled with oral contrast. There is a small amount of oral contrast within the small bowel. There are persistent dilated loops of small bowel scattered throughout the abdomen. A bone island projects over the patient's right iliac bone. There is a 2 graphic lucency projecting over the right iliac bone that is favored to represent artifact from overlapping gas filled bowel. IMPRESSION: Persistent small bowel obstruction. A large portion of the oral contrast remains within the stomach. No definite oral contrast visualized within the  colon. Electronically Signed   By: Constance Holster M.D.   On: 05/08/2019 03:35   Dg Abd Portable 1v-small Bowel Protocol-position Verification  Result Date: 05/07/2019 CLINICAL DATA:  Encounter for NG tube placement. EXAM: PORTABLE ABDOMEN - 1 VIEW 5:20 p.m. COMPARISON:  05/07/2019 at 8:16 a.m. and CT scan dated 02/08/2019 FINDINGS: NG tube has been inserted. The NG tube is looped in the fundus of the distended stomach. There multiple dilated loops of bowel as previously noted. Sclerotic lesion in the right iliac bone unchanged since CT scan of 02/08/2019 IMPRESSION: NG tube tip is in the fundus of the distended stomach. No change in the distended bowel loops. Electronically Signed   By: Lorriane Shire M.D.   On: 05/07/2019 18:41   Dg Abd Portable 1v-small Bowel Obstruction Protocol-initial, 8 Hr Delay  Result Date: 05/06/2019 CLINICAL DATA:  8 hour delay small-bowel obstruction EXAM: PORTABLE ABDOMEN - 1 VIEW COMPARISON:  05/06/2019, 05/05/2019 FINDINGS: Persistent dilatation of small bowel loops consistent with bowel obstruction. Small bowel distended up to 5.7 cm. Possible dilute contrast within dilated small bowel in the right abdomen. No convincing colon contrast. Residual contrast within the urinary bladder. IMPRESSION: 1. Persistent dilatation of small bowel, consistent with high-grade bowel obstruction. No convincing contrast within the colon. There may be slight dilute contrast within dilated small bowel in the right hemiabdomen. 2. Residual contrast within the urinary bladder Electronically Signed   By: Donavan Foil M.D.   On: 05/06/2019 22:48   [place] Ekg Site Rite  Result Date: 05/12/2019 If Site Rite image not attached, placement could not be confirmed due to current cardiac rhythm.  [place] Ekg Site Rite  Result Date: 05/11/2019 If Site Rite image not attached, placement could not be confirmed due to current cardiac rhythm.      Subjective:  Patient seen and examined the bedside this  morning.  Currently heart rate is well controlled.  He has been having bowel movements.  Denies any abdominal pain. Stable for discharge.  Discharge Exam: Vitals:   05/20/19 0038 05/20/19 0812  BP: 105/67 (!) 95/59  Pulse: 91   Resp: (!) 21   Temp: 97.6 F (36.4 C) 97.6 F (36.4 C)  SpO2: 99% 100%   Vitals:   05/19/19 2105 05/20/19 0038 05/20/19 0500 05/20/19 0812  BP: 102/63 105/67  (!) 95/59  Pulse: 88 91    Resp: 17 (!) 21    Temp: 98.5 F (36.9 C) 97.6 F (36.4 C)  97.6 F (36.4 C)  TempSrc: Oral Oral  Oral  SpO2: 99% 99%  100%  Weight:   79.3 kg   Height:        General: Pt is alert, awake, not in acute distress Cardiovascular: Afib, no rubs, no gallops Respiratory: CTA bilaterally, no wheezing, no rhonchi Abdominal: Soft, NT, ND, bowel sounds +,surgical wound Extremities: no edema, no cyanosis    The results of significant diagnostics from this hospitalization (including imaging, microbiology, ancillary and laboratory) are listed below for  reference.     Microbiology: Recent Results (from the past 240 hour(s))  MRSA PCR Screening     Status: None   Collection Time: 05/11/19  6:41 PM   Specimen: Nasopharyngeal  Result Value Ref Range Status   MRSA by PCR NEGATIVE NEGATIVE Final    Comment:        The GeneXpert MRSA Assay (FDA approved for NASAL specimens only), is one component of a comprehensive MRSA colonization surveillance program. It is not intended to diagnose MRSA infection nor to guide or monitor treatment for MRSA infections. Performed at [facility], [place] [address] [place], [place] [address]   Culture, blood (Routine X 2) w Reflex to ID Panel     Status: None   Collection Time: 05/13/19 10:15 AM   Specimen: BLOOD LEFT ARM  Result Value Ref Range Status   Specimen Description BLOOD LEFT ARM  Final   Special Requests   Final    BOTTLES DRAWN AEROBIC ONLY Blood Culture adequate volume   Culture   Final    NO GROWTH 5  DAYS Performed at [facility], [place] [address] [place], [place] [address]    Report Status 05/18/2019 FINAL  Final  Culture, blood (Routine X 2) w Reflex to ID Panel     Status: None   Collection Time: 05/13/19 10:15 AM   Specimen: BLOOD LEFT ARM  Result Value Ref Range Status   Specimen Description BLOOD LEFT ARM  Final   Special Requests   Final    BOTTLES DRAWN AEROBIC ONLY Blood Culture adequate volume   Culture   Final    NO GROWTH 5 DAYS Performed at [facility], [place] [address] [place], [place] [address]    Report Status 05/18/2019 FINAL  Final     Labs: BNP (last 3 results) Recent Labs    02/08/19 1401  BNP 616.0*   Basic Metabolic Panel: Recent Labs  Lab 05/14/19 0224 05/15/19 0849 05/17/19 0358 05/18/19 0421 05/20/19 0447  NA 142 139 135 135 136  K 3.4* 4.6 5.4* 4.6 3.8  CL 108 108 106 106 105  CO2 25 23 21* 20* 21*  GLUCOSE 158* 133* 132* 117* 138*  BUN 18 29* 36* 41* 44*  CREATININE 0.70 0.74 0.88 0.82 0.75  CALCIUM 8.7* 9.0 9.4 9.4 9.1  MG 1.8 1.8 2.0  --  1.7  PHOS 2.4* 3.2 4.3  --  3.5   Liver Function Tests: Recent Labs  Lab 05/17/19 0358 05/20/19 0447  AST 63* 20  ALT 121* 66*  ALKPHOS 100 116  BILITOT 1.0 0.6  PROT 7.1 6.9  ALBUMIN 3.0* 2.9*   No results for input(s): LIPASE, AMYLASE in the last 168 hours. No results for input(s): AMMONIA in the last 168 hours. CBC: Recent Labs  Lab 05/14/19 0224 05/15/19 0849 05/16/19 0505 05/17/19 0358 05/20/19 0447  WBC 11.3* 8.4 9.0 8.8 8.7  NEUTROABS 9.5*  --   --  6.2  --   HGB 10.1* 10.5* 10.9* 11.6* 9.2*  HCT 32.3* 33.7* 35.6* 37.0* 29.0*  MCV 82.6 83.4 84.0 83.9 84.8  PLT 257 221 220 186 223   Cardiac Enzymes: No results for input(s): CKTOTAL, CKMB, CKMBINDEX, TROPONINI in the last 168 hours. BNP: Invalid input(s): POCBNP CBG: Recent Labs  Lab 05/19/19 0653 05/19/19 1241 05/19/19 1831 05/20/19 0036 05/20/19 0545  GLUCAP 132* 135* 119* 132* 127*    D-Dimer No results for input(s): DDIMER in the last 72 hours.  Hgb A1c No results for input(s): HGBA1C in the last 72 hours. Lipid Profile No results for input(s): CHOL, HDL, LDLCALC, TRIG, CHOLHDL, LDLDIRECT in the last 72 hours. Thyroid function studies No results for input(s): TSH, T4TOTAL, T3FREE, THYROIDAB in the last 72 hours.  Invalid input(s): FREET3 Anemia work up No results for input(s): VITAMINB12, FOLATE, FERRITIN, TIBC, IRON, RETICCTPCT in the last 72 hours. Urinalysis    Component Value Date/Time   COLORURINE YELLOW 05/05/2019 1910   APPEARANCEUR HAZY (A) 05/05/2019 1910   LABSPEC 1.014 05/05/2019 1910   PHURINE 5.0 05/05/2019 1910   GLUCOSEU NEGATIVE 05/05/2019 1910   HGBUR NEGATIVE 05/05/2019 1910   BILIRUBINUR NEGATIVE 05/05/2019 1910   KETONESUR NEGATIVE 05/05/2019 1910   PROTEINUR NEGATIVE 05/05/2019 1910   NITRITE NEGATIVE 05/05/2019 1910   LEUKOCYTESUR LARGE (A) 05/05/2019 1910   Sepsis Labs Invalid input(s): PROCALCITONIN,  WBC,  LACTICIDVEN Microbiology Recent Results (from the past 240 hour(s))  MRSA PCR Screening     Status: None   Collection Time: 05/11/19  6:41 PM   Specimen: Nasopharyngeal  Result Value Ref Range Status   MRSA by PCR NEGATIVE NEGATIVE Final    Comment:        The GeneXpert MRSA Assay (FDA approved for NASAL specimens only), is one component of a comprehensive MRSA colonization surveillance program. It is not intended to diagnose MRSA infection nor to guide or monitor treatment for MRSA infections. Performed at [facility], [place] [address], [place], [place] [address]   Culture, blood (Routine X 2) w Reflex to ID Panel     Status: None   Collection Time: 05/13/19 10:15 AM   Specimen: BLOOD LEFT ARM  Result Value Ref Range Status   Specimen Description BLOOD LEFT ARM  Final   Special Requests   Final    BOTTLES DRAWN AEROBIC ONLY Blood Culture adequate volume   Culture   Final    NO GROWTH 5 DAYS Performed at  [facility], [place] [address], [place], [place] [address]    Report Status 05/18/2019 FINAL  Final  Culture, blood (Routine X 2) w Reflex to ID Panel     Status: None   Collection Time: 05/13/19 10:15 AM   Specimen: BLOOD LEFT ARM  Result Value Ref Range Status   Specimen Description BLOOD LEFT ARM  Final   Special Requests   Final    BOTTLES DRAWN AEROBIC ONLY Blood Culture adequate volume   Culture   Final    NO GROWTH 5 DAYS Performed at [facility], [place] [address], [place], [place] [address]    Report Status 05/18/2019 FINAL  Final    Please note: You were cared for by a hospitalist during your hospital stay. Once you are discharged, your primary care physician will handle any further medical issues. Please note that NO REFILLS for any discharge medications will be authorized once you are discharged, as it is imperative that you return to your primary care physician (or establish a relationship with a primary care physician if you do not have one) for your post hospital discharge needs so that they can reassess your need for medications and monitor your lab values.    Time coordinating discharge: 40 minutes  SIGNED:   Shelly Coss, MD  [facility] 05/20/2019, 10:10 AM Pager [phone]  If 7PM-7AM, please contact night-coverage [email or web address] Password [email or web address]

## 2019-05-21 ENCOUNTER — Other Ambulatory Visit: Payer: Self-pay

## 2019-05-22 ENCOUNTER — Inpatient Hospital Stay (HOSPITAL_COMMUNITY)
Admission: EM | Admit: 2019-05-22 | Discharge: 2019-05-29 | DRG: 871 | Disposition: A | Discharge status: Home Health | Payer: Medicare Other | Attending: Internal Medicine | Admitting: Internal Medicine

## 2019-05-22 ENCOUNTER — Emergency Department (HOSPITAL_COMMUNITY): Payer: Medicare Other

## 2019-05-22 DIAGNOSIS — B952 Enterococcus as the cause of diseases classified elsewhere: Secondary | ICD-10-CM | POA: Diagnosis present

## 2019-05-22 DIAGNOSIS — I5032 Chronic diastolic (congestive) heart failure: Secondary | ICD-10-CM | POA: Diagnosis not present

## 2019-05-22 DIAGNOSIS — Z20828 Contact with and (suspected) exposure to other viral communicable diseases: Secondary | ICD-10-CM | POA: Diagnosis present

## 2019-05-22 DIAGNOSIS — I351 Nonrheumatic aortic (valve) insufficiency: Secondary | ICD-10-CM | POA: Diagnosis present

## 2019-05-22 DIAGNOSIS — I4821 Permanent atrial fibrillation: Secondary | ICD-10-CM | POA: Diagnosis present

## 2019-05-22 DIAGNOSIS — N390 Urinary tract infection, site not specified: Secondary | ICD-10-CM | POA: Diagnosis not present

## 2019-05-22 DIAGNOSIS — K6389 Other specified diseases of intestine: Secondary | ICD-10-CM | POA: Diagnosis not present

## 2019-05-22 DIAGNOSIS — A4181 Sepsis due to Enterococcus: Secondary | ICD-10-CM | POA: Diagnosis not present

## 2019-05-22 DIAGNOSIS — I4819 Other persistent atrial fibrillation: Secondary | ICD-10-CM | POA: Diagnosis not present

## 2019-05-22 DIAGNOSIS — Z7901 Long term (current) use of anticoagulants: Secondary | ICD-10-CM

## 2019-05-22 DIAGNOSIS — E876 Hypokalemia: Secondary | ICD-10-CM | POA: Diagnosis not present

## 2019-05-22 DIAGNOSIS — F319 Bipolar disorder, unspecified: Secondary | ICD-10-CM | POA: Diagnosis present

## 2019-05-22 DIAGNOSIS — E785 Hyperlipidemia, unspecified: Secondary | ICD-10-CM | POA: Diagnosis present

## 2019-05-22 DIAGNOSIS — E2740 Unspecified adrenocortical insufficiency: Secondary | ICD-10-CM | POA: Diagnosis present

## 2019-05-22 DIAGNOSIS — R509 Fever, unspecified: Secondary | ICD-10-CM

## 2019-05-22 DIAGNOSIS — F0390 Unspecified dementia without behavioral disturbance: Secondary | ICD-10-CM | POA: Diagnosis not present

## 2019-05-22 DIAGNOSIS — Z6824 Body mass index (BMI) 24.0-24.9, adult: Secondary | ICD-10-CM

## 2019-05-22 DIAGNOSIS — R6521 Severe sepsis with septic shock: Secondary | ICD-10-CM | POA: Diagnosis not present

## 2019-05-22 DIAGNOSIS — R6520 Severe sepsis without septic shock: Secondary | ICD-10-CM | POA: Diagnosis not present

## 2019-05-22 DIAGNOSIS — Z7401 Bed confinement status: Secondary | ICD-10-CM | POA: Diagnosis not present

## 2019-05-22 DIAGNOSIS — E872 Acidosis: Secondary | ICD-10-CM | POA: Diagnosis present

## 2019-05-22 DIAGNOSIS — Z79899 Other long term (current) drug therapy: Secondary | ICD-10-CM | POA: Diagnosis not present

## 2019-05-22 DIAGNOSIS — K560 Paralytic ileus: Secondary | ICD-10-CM | POA: Diagnosis present

## 2019-05-22 DIAGNOSIS — I4891 Unspecified atrial fibrillation: Secondary | ICD-10-CM | POA: Diagnosis not present

## 2019-05-22 DIAGNOSIS — A419 Sepsis, unspecified organism: Secondary | ICD-10-CM | POA: Diagnosis not present

## 2019-05-22 DIAGNOSIS — I110 Hypertensive heart disease with heart failure: Secondary | ICD-10-CM | POA: Diagnosis not present

## 2019-05-22 DIAGNOSIS — R531 Weakness: Secondary | ICD-10-CM | POA: Diagnosis not present

## 2019-05-22 DIAGNOSIS — R5381 Other malaise: Secondary | ICD-10-CM | POA: Diagnosis not present

## 2019-05-22 DIAGNOSIS — D649 Anemia, unspecified: Secondary | ICD-10-CM | POA: Diagnosis present

## 2019-05-22 DIAGNOSIS — R0902 Hypoxemia: Secondary | ICD-10-CM | POA: Diagnosis not present

## 2019-05-22 DIAGNOSIS — M25562 Pain in left knee: Secondary | ICD-10-CM | POA: Diagnosis not present

## 2019-05-22 DIAGNOSIS — G9341 Metabolic encephalopathy: Secondary | ICD-10-CM | POA: Diagnosis present

## 2019-05-22 DIAGNOSIS — Z8249 Family history of ischemic heart disease and other diseases of the circulatory system: Secondary | ICD-10-CM | POA: Diagnosis not present

## 2019-05-22 DIAGNOSIS — R000 Tachycardia, unspecified: Secondary | ICD-10-CM | POA: Diagnosis not present

## 2019-05-22 DIAGNOSIS — Z87440 Personal history of urinary (tract) infections: Secondary | ICD-10-CM

## 2019-05-22 DIAGNOSIS — R4182 Altered mental status, unspecified: Secondary | ICD-10-CM | POA: Diagnosis not present

## 2019-05-22 DIAGNOSIS — Z8719 Personal history of other diseases of the digestive system: Secondary | ICD-10-CM | POA: Diagnosis not present

## 2019-05-22 DIAGNOSIS — R8281 Pyuria: Secondary | ICD-10-CM | POA: Diagnosis not present

## 2019-05-22 DIAGNOSIS — N3000 Acute cystitis without hematuria: Secondary | ICD-10-CM | POA: Diagnosis not present

## 2019-05-22 DIAGNOSIS — M109 Gout, unspecified: Secondary | ICD-10-CM | POA: Diagnosis present

## 2019-05-22 DIAGNOSIS — N400 Enlarged prostate without lower urinary tract symptoms: Secondary | ICD-10-CM | POA: Diagnosis present

## 2019-05-22 DIAGNOSIS — R7881 Bacteremia: Secondary | ICD-10-CM | POA: Diagnosis not present

## 2019-05-22 DIAGNOSIS — K8020 Calculus of gallbladder without cholecystitis without obstruction: Secondary | ICD-10-CM | POA: Diagnosis not present

## 2019-05-22 DIAGNOSIS — R52 Pain, unspecified: Secondary | ICD-10-CM | POA: Diagnosis not present

## 2019-05-22 DIAGNOSIS — M2550 Pain in unspecified joint: Secondary | ICD-10-CM | POA: Diagnosis not present

## 2019-05-22 HISTORY — DX: Metabolic encephalopathy: G93.41

## 2019-05-22 HISTORY — DX: Benign prostatic hyperplasia without lower urinary tract symptoms: N40.0

## 2019-05-22 LAB — CBC WITH DIFFERENTIAL/PLATELET
Abs Immature Granulocytes: 0.08 10*3/uL — ABNORMAL HIGH (ref 0.00–0.07)
Basophils Absolute: 0 10*3/uL (ref 0.0–0.1)
Basophils Relative: 0 %
Eosinophils Absolute: 0 10*3/uL (ref 0.0–0.5)
Eosinophils Relative: 0 %
HCT: 33.8 % — ABNORMAL LOW (ref 39.0–52.0)
Hemoglobin: 10.7 g/dL — ABNORMAL LOW (ref 13.0–17.0)
Immature Granulocytes: 1 %
Lymphocytes Relative: 3 %
Lymphs Abs: 0.4 10*3/uL — ABNORMAL LOW (ref 0.7–4.0)
MCH: 26.8 pg (ref 26.0–34.0)
MCHC: 31.7 g/dL (ref 30.0–36.0)
MCV: 84.7 fL (ref 80.0–100.0)
Monocytes Absolute: 0.7 10*3/uL (ref 0.1–1.0)
Monocytes Relative: 4 %
Neutro Abs: 14.4 10*3/uL — ABNORMAL HIGH (ref 1.7–7.7)
Neutrophils Relative %: 92 %
Platelets: 310 10*3/uL (ref 150–400)
RBC: 3.99 MIL/uL — ABNORMAL LOW (ref 4.22–5.81)
RDW: 21.2 % — ABNORMAL HIGH (ref 11.5–15.5)
WBC: 15.6 10*3/uL — ABNORMAL HIGH (ref 4.0–10.5)
nRBC: 0 % (ref 0.0–0.2)

## 2019-05-22 LAB — PROCALCITONIN: Procalcitonin: 2.32 ng/mL

## 2019-05-22 LAB — URINALYSIS, ROUTINE W REFLEX MICROSCOPIC
Bilirubin Urine: NEGATIVE
Glucose, UA: NEGATIVE mg/dL
Ketones, ur: NEGATIVE mg/dL
Nitrite: NEGATIVE
Protein, ur: NEGATIVE mg/dL
Specific Gravity, Urine: 1.012 (ref 1.005–1.030)
WBC, UA: >50 WBC/hpf — ABNORMAL HIGH (ref 0–5)
pH: 5 (ref 5.0–8.0)

## 2019-05-22 LAB — COMPREHENSIVE METABOLIC PANEL
ALT: 43 U/L (ref 0–44)
AST: 18 U/L (ref 15–41)
Albumin: 2.9 g/dL — ABNORMAL LOW (ref 3.5–5.0)
Alkaline Phosphatase: 118 U/L (ref 38–126)
Anion gap: 10 (ref 5–15)
BUN: 26 mg/dL — ABNORMAL HIGH (ref 8–23)
CO2: 20 mmol/L — ABNORMAL LOW (ref 22–32)
Calcium: 9.2 mg/dL (ref 8.9–10.3)
Chloride: 106 mmol/L (ref 98–111)
Creatinine, Ser: 1.01 mg/dL (ref 0.61–1.24)
GFR calc Af Amer: >60 mL/min (ref >60)
GFR calc non Af Amer: >60 mL/min (ref >60)
Glucose, Bld: 126 mg/dL — ABNORMAL HIGH (ref 70–99)
Potassium: 4 mmol/L (ref 3.5–5.1)
Sodium: 136 mmol/L (ref 135–145)
Total Bilirubin: 1.4 mg/dL — ABNORMAL HIGH (ref 0.3–1.2)
Total Protein: 7.6 g/dL (ref 6.5–8.1)

## 2019-05-22 LAB — LACTIC ACID, PLASMA
Lactic Acid, Venous: 1.6 mmol/L (ref 0.5–1.9)
Lactic Acid, Venous: 2 mmol/L (ref 0.5–1.9)
Lactic Acid, Venous: 1.2 mmol/L (ref 0.5–1.9)

## 2019-05-22 LAB — GLUCOSE, CAPILLARY: Glucose-Capillary: 103 mg/dL — ABNORMAL HIGH (ref 70–99)

## 2019-05-22 LAB — SARS CORONAVIRUS 2 BY RT PCR (HOSPITAL ORDER, PERFORMED IN ~~LOC~~ HOSPITAL LAB): SARS Coronavirus 2: NEGATIVE

## 2019-05-22 LAB — APTT: aPTT: 45 seconds — ABNORMAL HIGH (ref 24–36)

## 2019-05-22 LAB — PROTIME-INR
INR: 1.4 — ABNORMAL HIGH (ref 0.8–1.2)
Prothrombin Time: 16.9 seconds — ABNORMAL HIGH (ref 11.4–15.2)

## 2019-05-22 LAB — URIC ACID: Uric Acid, Serum: 4.9 mg/dL (ref 3.7–8.6)

## 2019-05-22 MED ORDER — ONDANSETRON HCL 4 MG/2ML IJ SOLN: 4.0000 mg | Freq: Four times a day (QID) | INTRAMUSCULAR | Status: DC | PRN

## 2019-05-22 MED ORDER — COLCHICINE 0.6 MG PO TABS
0.6000 mg | ORAL_TABLET | Freq: Two times a day (BID) | ORAL | Status: DC
  Administered 2019-05-23 – 2019-05-29 (×11): 0.6 mg via ORAL
  Filled 2019-05-22 (×13): qty 1

## 2019-05-22 MED ORDER — VANCOMYCIN HCL 10 G IV SOLR
1500.0000 mg | Freq: Once | INTRAVENOUS | Status: AC
  Administered 2019-05-22: 1500 mg via INTRAVENOUS
  Filled 2019-05-22: qty 1500

## 2019-05-22 MED ORDER — DOCUSATE SODIUM 100 MG PO CAPS
100.0000 mg | ORAL_CAPSULE | Freq: Two times a day (BID) | ORAL | Status: DC
  Administered 2019-05-25 – 2019-05-29 (×9): 100 mg via ORAL
  Filled 2019-05-22 (×13): qty 1

## 2019-05-22 MED ORDER — METRONIDAZOLE IN NACL 5-0.79 MG/ML-% IV SOLN
500.0000 mg | Freq: Three times a day (TID) | INTRAVENOUS | Status: DC
  Administered 2019-05-22 – 2019-05-23 (×2): 500 mg via INTRAVENOUS
  Filled 2019-05-22 (×2): qty 100

## 2019-05-22 MED ORDER — ACETAMINOPHEN 325 MG PO TABS: 650.0000 mg | ORAL_TABLET | Freq: Four times a day (QID) | ORAL | Status: DC | PRN

## 2019-05-22 MED ORDER — SODIUM CHLORIDE 0.9 % IV SOLN
2.0000 g | Freq: Four times a day (QID) | INTRAVENOUS | Status: DC
  Filled 2019-05-22 (×4): qty 2000

## 2019-05-22 MED ORDER — BISACODYL 10 MG RE SUPP
10.0000 mg | Freq: Every day | RECTAL | Status: DC | PRN
  Filled 2019-05-22: qty 1

## 2019-05-22 MED ORDER — SODIUM CHLORIDE 0.9 % IV SOLN
2.0000 g | Freq: Once | INTRAVENOUS | Status: AC
  Administered 2019-05-22: 2 g via INTRAVENOUS
  Filled 2019-05-22: qty 2

## 2019-05-22 MED ORDER — LACTATED RINGERS IV SOLN
INTRAVENOUS | Status: DC
  Administered 2019-05-22 – 2019-05-29 (×12): via INTRAVENOUS

## 2019-05-22 MED ORDER — DEXTROSE 5 % IV SOLN
10.0000 mg/kg | Freq: Once | INTRAVENOUS | Status: AC
  Administered 2019-05-22: 795 mg via INTRAVENOUS
  Filled 2019-05-22 (×3): qty 15.9

## 2019-05-22 MED ORDER — SODIUM CHLORIDE 0.9% FLUSH: 3.0000 mL | Freq: Once | INTRAVENOUS | Status: DC

## 2019-05-22 MED ORDER — DEXTROSE 5 % IV SOLN
10.0000 mg/kg | Freq: Three times a day (TID) | INTRAVENOUS | Status: DC
  Administered 2019-05-23: 795 mg via INTRAVENOUS
  Filled 2019-05-22 (×6): qty 15.9

## 2019-05-22 MED ORDER — ACETAMINOPHEN 325 MG PO TABS
650.0000 mg | ORAL_TABLET | Freq: Once | ORAL | Status: AC
  Administered 2019-05-22: 650 mg via ORAL
  Filled 2019-05-22: qty 2

## 2019-05-22 MED ORDER — LITHIUM CARBONATE 150 MG PO CAPS
150.0000 mg | ORAL_CAPSULE | Freq: Every day | ORAL | Status: DC
  Administered 2019-05-22 – 2019-05-28 (×6): 150 mg via ORAL
  Filled 2019-05-22 (×8): qty 1

## 2019-05-22 MED ORDER — SODIUM CHLORIDE 0.9 % IV BOLUS (SEPSIS)
500.0000 mL | Freq: Once | INTRAVENOUS | Status: AC
  Administered 2019-05-22: 500 mL via INTRAVENOUS

## 2019-05-22 MED ORDER — VANCOMYCIN HCL IN DEXTROSE 1-5 GM/200ML-% IV SOLN: 1000.0000 mg | Freq: Once | INTRAVENOUS | Status: DC

## 2019-05-22 MED ORDER — SODIUM CHLORIDE 0.9 % IV BOLUS
500.0000 mL | Freq: Once | INTRAVENOUS | Status: AC
  Administered 2019-05-22: 500 mL via INTRAVENOUS

## 2019-05-22 MED ORDER — ALBUMIN HUMAN 5 % IV SOLN
12.5000 g | Freq: Once | INTRAVENOUS | Status: AC
  Administered 2019-05-23: 12.5 g via INTRAVENOUS
  Filled 2019-05-22 (×2): qty 250

## 2019-05-22 MED ORDER — SODIUM CHLORIDE 0.9 % IV SOLN
2.0000 g | Freq: Three times a day (TID) | INTRAVENOUS | Status: DC
  Administered 2019-05-22 – 2019-05-23 (×2): 2 g via INTRAVENOUS
  Filled 2019-05-22 (×4): qty 2

## 2019-05-22 MED ORDER — SODIUM CHLORIDE 0.9 % IV BOLUS (SEPSIS)
1000.0000 mL | Freq: Once | INTRAVENOUS | Status: AC
  Administered 2019-05-22: 14:00:00 1000 mL via INTRAVENOUS

## 2019-05-22 MED ORDER — DILTIAZEM LOAD VIA INFUSION
10.0000 mg | Freq: Once | INTRAVENOUS | Status: AC
  Administered 2019-05-22: 10 mg via INTRAVENOUS
  Filled 2019-05-22: qty 10

## 2019-05-22 MED ORDER — KETOROLAC TROMETHAMINE 30 MG/ML IJ SOLN
15.0000 mg | Freq: Once | INTRAMUSCULAR | Status: AC | PRN
  Administered 2019-05-22: 15 mg via INTRAVENOUS
  Filled 2019-05-22: qty 1

## 2019-05-22 MED ORDER — DILTIAZEM HCL ER COATED BEADS 120 MG PO CP24
120.0000 mg | ORAL_CAPSULE | Freq: Every day | ORAL | Status: DC
  Filled 2019-05-22: qty 1

## 2019-05-22 MED ORDER — ONDANSETRON HCL 4 MG PO TABS
4.0000 mg | ORAL_TABLET | Freq: Four times a day (QID) | ORAL | Status: DC | PRN
  Administered 2019-05-24: 4 mg via ORAL
  Filled 2019-05-22: qty 1

## 2019-05-22 MED ORDER — FINASTERIDE 5 MG PO TABS
5.0000 mg | ORAL_TABLET | Freq: Every morning | ORAL | Status: DC
  Administered 2019-05-25 – 2019-05-29 (×5): 5 mg via ORAL
  Filled 2019-05-22 (×6): qty 1

## 2019-05-22 MED ORDER — SENNOSIDES-DOCUSATE SODIUM 8.6-50 MG PO TABS: 1.0000 | ORAL_TABLET | Freq: Every evening | ORAL | Status: DC | PRN

## 2019-05-22 MED ORDER — SODIUM CHLORIDE 0.9 % IV BOLUS (SEPSIS)
1000.0000 mL | Freq: Once | INTRAVENOUS | Status: AC
  Administered 2019-05-22: 1000 mL via INTRAVENOUS

## 2019-05-22 MED ORDER — DONEPEZIL HCL 10 MG PO TABS
10.0000 mg | ORAL_TABLET | Freq: Every day | ORAL | Status: DC
  Administered 2019-05-25 – 2019-05-28 (×4): 10 mg via ORAL
  Filled 2019-05-22 (×5): qty 1
  Filled 2019-05-22: qty 2
  Filled 2019-05-22: qty 1

## 2019-05-22 MED ORDER — DILTIAZEM HCL-DEXTROSE 100-5 MG/100ML-% IV SOLN (PREMIX)
5.0000 mg/h | INTRAVENOUS | Status: DC
  Administered 2019-05-22: 5 mg/h via INTRAVENOUS
  Filled 2019-05-22: qty 100

## 2019-05-22 MED ORDER — METRONIDAZOLE IN NACL 5-0.79 MG/ML-% IV SOLN
500.0000 mg | Freq: Once | INTRAVENOUS | Status: AC
  Administered 2019-05-22: 500 mg via INTRAVENOUS
  Filled 2019-05-22: qty 100

## 2019-05-22 MED ORDER — VANCOMYCIN HCL IN DEXTROSE 1-5 GM/200ML-% IV SOLN
1000.0000 mg | Freq: Two times a day (BID) | INTRAVENOUS | Status: DC
  Administered 2019-05-23: 1000 mg via INTRAVENOUS
  Filled 2019-05-22: qty 200

## 2019-05-22 MED ORDER — ACETAMINOPHEN 650 MG RE SUPP: 650.0000 mg | Freq: Four times a day (QID) | RECTAL | Status: DC | PRN

## 2019-05-22 NOTE — ED Notes (Signed)
Abdominal dressing is not soiled.

## 2019-05-22 NOTE — Significant Event (Addendum)
Rapid Response Event Note  Overview: Time Called: 2200 Arrival Time: 2200 Event Type: Hypotension(SBP 60s)  Initial Focused Assessment: Pt was just received to 4E from ED. Upon arrival, Russell Dillon is lethargic, confused, oriented to self. Currrently afebrile, tmax 104.2 F, HR 109 ST, 72/46 (56), RR 18 with sats 93% on RA. Rectal temp 99.2 F. He is pale, clammy, and warm. NS bolus 500cc infusing per Dr. Leontine Locket admission orders. Lovey Newcomer NP notified. Orders received for additional NS 500cc to make a total of 4L NS volume resuscitation since arriving in the ED. If BP not improved following volume, will consult PCCM per [facility].   Interventions: -NS bolus 1L per Jeannette Corpus NP   Plan of Care (if not transferred): -monitor for further hypotension -Notify primary svc and/or RRRN for any further clinical decompensations  Addendum: 0130-Assisted with transport to 3M05 due to persistent hypotension despite volume resuscitation.  Event Summary: Name of Physician Notified: X. Blount NP at 2205    at    Outcome: Stayed in room and stabalized  Event End Time: 2315  Madelynn Done

## 2019-05-22 NOTE — ED Provider Notes (Addendum)
[facility] EMERGENCY DEPARTMENT Provider Note   CSN: [id number] Arrival date & time: 05/22/19  1230     History   Chief Complaint Chief Complaint  Patient presents with   Altered Mental Status    HPI Russell Dillon is a 73 y.o. male.     HPI  73 yo male discharged 2 days ago after admission and surgery for sbo, with wound healing by secondary intention, previously admitted for uti.  Presents with  Ams.  Wife states he has baseline dementia.  When she took home 2 days ago at baseline but now more confused.  He has been givne meds as per d/c instructions.  She has been doing wound care andpacking.   Past Medical History:  Diagnosis Date   Aortic valve regurgitation 10/16/2018   Echo 07/28/2017:  EF 60-65, moderate AI, aortic root and ascending aorta mildly dilated (40 mm), ascending aorta 43 mm, MAC, mild MR, mild LAE, moderate RV enlargement, trivial TR // Echo 12/19: EF 60-65, normal wall motion, mild AI, moderate BAE, ascending aorta 43 mm, aortic root 39 mm   BPH (benign prostatic hypertrophy)    Chronic anticoagulation    Chronic atrial fibrillation    Diastolic dysfunction October 2010   Normal LV systolic function   Hx SBO    Hyperlipidemia    Hypertension    Patellar tendon rupture, left, initial encounter 02/10/2019   Small bowel obstruction [facility])     Patient Active Problem List   Diagnosis Date Noted   Protein-calorie malnutrition, severe 05/18/2019   Sepsis secondary to UTI ([place]) 05/05/2019   AKI (acute kidney injury) ([place]) 05/05/2019   Transient hypotension 05/05/2019   Sepsis, Gram negative ([place]) 03/18/2019   Hypoalbuminemia    Leukocytosis    Hyperglycemia    Acute on chronic anemia    Rupture of left patellar tendon 02/15/2019   Postoperative pain    Benign prostatic hyperplasia    Bipolar 1 disorder ([place]) 02/14/2019   Dementia without behavioral disturbance ([place]) 02/14/2019   Patellar tendon rupture,  left, initial encounter 02/10/2019   SIRS (systemic inflammatory response syndrome) ([employer]) 02/09/2019   Osteomyelitis ([place]) 02/09/2019   Acute blood loss anemia 02/09/2019   Aortic valve regurgitation 10/16/2018   Encounter for therapeutic drug monitoring 12/02/2013   SBO (small bowel obstruction) ([place]) 07/08/2013   Hyperlipidemia 12/20/2011   HTN (hypertension) 06/04/2011   Atrial fibrillation, chronic 03/01/2011   Long term (current) use of anticoagulants 03/01/2011   GERD 10/11/2009   DIVERTICULOSIS OF COLON 10/11/2009    Past Surgical History:  Procedure Laterality Date   APPENDECTOMY  1700   APPLICATION OF WOUND VAC Left 02/11/2019   Procedure: Application Of Wound Vac;  Surgeon: Altamese [place]town, MD;  Location: [place];  Service: Orthopedics;  Laterality: Left;   CARDIOVASCULAR STRESS TEST  09/30/2007   EF 67%  No ischemia.    CARDIOVERSION  05/01/2004   KNEE ARTHROSCOPY Left 02/11/2019   Procedure: ARTHROSCOPY LEFT KNEE;  Surgeon: Altamese [place], MD;  Location: [place];  Service: Orthopedics;  Laterality: Left;   LAPAROTOMY N/A 05/11/2019   Procedure: EXPLORATORY LAPAROTOMY;  Surgeon: Rolm Bookbinder, MD;  Location: [place];  Service: General;  Laterality: N/A;   LYSIS OF ADHESION N/A 05/11/2019   Procedure: LYSIS OF ADHESION;  Surgeon: Rolm Bookbinder, MD;  Location: [place];  Service: General;  Laterality: N/A;   PATELLAR TENDON REPAIR Left 02/11/2019   Procedure: LEFT PATELLA TENDON REPAIR;  Surgeon: Altamese [place], MD;  Location: [place];  Service: Orthopedics;  Laterality: Left;   SMALL INTESTINE SURGERY  2004   US ECHOCARDIOGRAPHY  09/05/2009   ef 55-60%        Home Medications    Prior to Admission medications   Medication Sig Start Date End Date Taking? Authorizing Provider  acetaminophen (TYLENOL) 325 MG tablet Take 2 tablets (650 mg total) by mouth every 6 (six) hours as needed for mild pain, fever or headache. 03/02/19   Angiulli, Lavon Paganini, PA-C    allopurinol (ZYLOPRIM) 100 MG tablet Take 1 tablet (100 mg total) by mouth daily. Patient not taking: Reported on 05/05/2019 03/26/19   Radene Gunning, NP  Cholecalciferol (VITAMIN D-3) 25 MCG (1000 UT) CAPS Take 1,000 Units by mouth 2 (two) times a day.    [provider]  colchicine 0.6 MG tablet Take 1 tablet (0.6 mg total) by mouth 2 (two) times daily. 03/25/19   Dhungel, Nishant, MD  Cyanocobalamin (B-12 COMPLIANCE INJECTION) 1000 MCG/ML KIT Inject 1,000 mcg as directed every 30 (thirty) days.    [provider]  diltiazem (CARDIZEM CD) 120 MG 24 hr capsule Take 1 capsule (120 mg total) by mouth daily. 03/25/19 03/24/20  Black, Lezlie Octave, NP  docusate sodium (COLACE) 100 MG capsule Take 1 capsule (100 mg total) by mouth 2 (two) times daily. 05/20/19   Shelly Coss, MD  donepezil (ARICEPT) 10 MG tablet Take 1 tablet (10 mg total) by mouth daily. Patient taking differently: Take 10 mg by mouth at bedtime.  03/02/19   Angiulli, Lavon Paganini, PA-C  feeding supplement, ENSURE ENLIVE, (ENSURE ENLIVE) LIQD Take 237 mLs by mouth 2 (two) times daily between meals. Patient taking differently: Take 237 mLs by mouth 2 (two) times daily as needed (for supplementation). CHOCOLATE 03/22/19   Regalado, Belkys A, MD  finasteride (PROSCAR) 5 MG tablet Take 1 tablet (5 mg total) by mouth every morning. 03/02/19   Angiulli, Lavon Paganini, PA-C  HYDROcodone-acetaminophen (NORCO/VICODIN) 5-325 MG tablet Take 1-2 tablets by mouth every 4 (four) hours as needed for moderate pain (pain score 4-6). Patient not taking: Reported on 05/05/2019 03/02/19   Angiulli, Lavon Paganini, PA-C  lithium carbonate 150 MG capsule TAKE 1 CAPSULE(150 MG) BY MOUTH AT BEDTIME Patient taking differently: Take 150 mg by mouth at bedtime.  04/19/19   Cottle, Billey Co., MD  Multiple Vitamins-Minerals (ICAPS AREDS 2 PO) Take 1 capsule by mouth daily.     [provider]  Omega-3 Fatty Acids (OMEGA-3 FISH OIL PO) Take 1 capsule by mouth  daily with breakfast.     [provider]  polyethylene glycol powder (GLYCOLAX/MIRALAX) 17 GM/SCOOP powder Take 17 g by mouth daily. 05/20/19 06/19/19  Shelly Coss, MD  warfarin (COUMADIN) 2.5 MG tablet Take 1 tablet (2.5 mg total) by mouth daily. Patient taking differently: Take 2.5 mg by mouth daily at 6 PM.  03/02/19   Angiulli, Lavon Paganini, PA-C    Family History Family History  Problem Relation Age of Onset   Heart attack Mother    Hypertension Mother    Diabetes Father    Heart disease Father    Colon cancer Neg Hx    Esophageal cancer Neg Hx    Pancreatic cancer Neg Hx    Prostate cancer Neg Hx    Rectal cancer Neg Hx    Stomach cancer Neg Hx     Social History Social History   Tobacco Use   Smoking status: Never Smoker  Smokeless tobacco: Never Used  Substance Use Topics   Alcohol use: No    Alcohol/week: 0.0 standard drinks   Drug use: No     Allergies   Patient has no known allergies.   Review of Systems Review of Systems   Physical Exam Updated Vital Signs BP 110/79    Pulse (!) 117    Temp (!) 100.8 F (38.2 C) (Rectal)    Resp (!) 24    SpO2 100%   Physical Exam Vitals signs and nursing note reviewed.  Constitutional:      General: He is not in acute distress.    Appearance: He is obese. He is ill-appearing.  HENT:     Head: Normocephalic.     Right Ear: External ear normal.     Left Ear: External ear normal.     Nose: Nose normal.     Mouth/Throat:     Mouth: Mucous membranes are dry.  Eyes:     Pupils: Pupils are equal, round, and reactive to light.  Neck:     Musculoskeletal: Normal range of motion.  Cardiovascular:     Rate and Rhythm: Normal rate and regular rhythm.  Pulmonary:     Effort: Pulmonary effort is normal.  Abdominal:     Comments: Distended Dressing place Large vertical incision with with packing in place Bowels sounds are decreased Mild ttp  Musculoskeletal: Normal range of motion.         General: Swelling present.  Skin:    General: Skin is warm and dry.     Capillary Refill: Capillary refill takes less than 2 seconds.  Neurological:     General: No focal deficit present.     Mental Status: He is alert.     Comments: Patient with some confusion regarding time and place, but no focal deficit noted.   Psychiatric:        Mood and Affect: Mood normal.      ED Treatments / Results  Labs (all labs ordered are listed, but only abnormal results are displayed) Labs Reviewed  COMPREHENSIVE METABOLIC PANEL - Abnormal; Notable for the following components:      Result Value   CO2 20 (*)    Glucose, Bld 126 (*)    BUN 26 (*)    Albumin 2.9 (*)    Total Bilirubin 1.4 (*)    All other components within normal limits  CBC WITH DIFFERENTIAL/PLATELET - Abnormal; Notable for the following components:   WBC 15.6 (*)    RBC 3.99 (*)    Hemoglobin 10.7 (*)    HCT 33.8 (*)    RDW 21.2 (*)    Neutro Abs 14.4 (*)    Lymphs Abs 0.4 (*)    Abs Immature Granulocytes 0.08 (*)    All other components within normal limits  APTT - Abnormal; Notable for the following components:   aPTT 45 (*)    All other components within normal limits  PROTIME-INR - Abnormal; Notable for the following components:   Prothrombin Time 16.9 (*)    INR 1.4 (*)    All other components within normal limits  CULTURE, BLOOD (ROUTINE X 2)  CULTURE, BLOOD (ROUTINE X 2)  URINE CULTURE  LACTIC ACID, PLASMA  LACTIC ACID, PLASMA  URINALYSIS, ROUTINE W REFLEX MICROSCOPIC  CBG MONITORING, ED    EKG None  Radiology Dg Chest Port 1 View  Result Date: 05/22/2019 CLINICAL DATA:  Fevers, history of recent surgery for small bowel obstruction EXAM: PORTABLE  CHEST 1 VIEW COMPARISON:  05/12/2019 FINDINGS: Cardiac shadow is stable. Aortic calcifications are seen. Lungs are hypoinflated but clear. No acute abnormality noted. Right-sided PICC line has been removed in the interval. IMPRESSION: No acute abnormality  noted. Electronically Signed   By: Inez Catalina M.D.   On: 05/22/2019 15:48    Procedures .Critical Care Performed by: Pattricia Boss, MD Authorized by: Pattricia Boss, MD   Critical care provider statement:    Critical care time (minutes):  60   Critical care was necessary to treat or prevent imminent or life-threatening deterioration of the following conditions:  Cardiac failure, metabolic crisis and sepsis   Critical care was time spent personally by me on the following activities:  Discussions with consultants, evaluation of patient's response to treatment, examination of patient, ordering and performing treatments and interventions, ordering and review of laboratory studies, ordering and review of radiographic studies, pulse oximetry, re-evaluation of patient's condition, obtaining history from patient or surrogate and review of old charts   (including critical care time)  Medications Ordered in ED Medications  sodium chloride flush (NS) 0.9 % injection 3 mL (has no administration in time range)  vancomycin (VANCOCIN) 1,500 mg in sodium chloride 0.9 % 500 mL IVPB (1,500 mg Intravenous New Bag/Given 05/22/19 1524)  diltiazem (CARDIZEM) 1 mg/mL load via infusion 10 mg (has no administration in time range)    And  diltiazem (CARDIZEM) 100 mg in dextrose 5% 158m (1 mg/mL) infusion (has no administration in time range)  sodium chloride 0.9 % bolus 1,000 mL (0 mLs Intravenous Stopped 05/22/19 1412)    And  sodium chloride 0.9 % bolus 1,000 mL (0 mLs Intravenous Stopped 05/22/19 1515)    And  sodium chloride 0.9 % bolus 500 mL (500 mLs Intravenous New Bag/Given 05/22/19 1521)  ceFEPIme (MAXIPIME) 2 g in sodium chloride 0.9 % 100 mL IVPB (0 g Intravenous Stopped 05/22/19 1515)  metroNIDAZOLE (FLAGYL) IVPB 500 mg (0 mg Intravenous Stopped 05/22/19 1516)     Initial Impression / Assessment and Plan / ED Course  I have reviewed the triage vital signs and the nursing notes.  Pertinent labs &  imaging results that were available during my care of the patient were reviewed by me and considered in my medical decision making (see chart for details).        4:49 PM Temp 104.2- contributing to tachycardia, tylenol ordered  73yo male with recent admissions for uti and sbo, presents today with increased confusion and fever.  Evaluation here for infectious etiology with urine, blood cultures,  and covid pending.  Abdomen soft and wound looks good- surgery consulted due to recent procedure.  CXR clear, no obvious skin source or external signs of infection.  Patient given iv fluids and broad spectrum abx coverage.  Initial lactic norma.  HR continued elevated, iv cardizem ordered. Discussed with Drs TGrandville Silosand AReesa Chewand Dr. AReesa Chewwill see for admission. 1- fever/ likely infectious etiology- urine now back with >50 wbc, last urine culture result 6/24 with 80,000 colonies corynebacterium-standard susceptibility not done. Patient does not appear to have been discharged on antibiotics 2- a fib rvr- fluids given, cardizem being started 3- recent sbo, s/p lap with wound healing open- abdomen appears soft.  Surgery consulted to evaluate 4- anemia- appears stable 5- dementia/confusion- worsening with infection but does not appear focal or s/s c.w. cns infection.    Final Clinical Impressions(s) / ED Diagnoses   Final diagnoses:  Urinary tract infection without hematuria, site  unspecified  Atrial fibrillation with RVR (HCC)  Fever, unspecified fever cause    ED Discharge Orders    None       Pattricia Boss, MD 05/22/19 [id number]    Pattricia Boss, MD 05/31/19 1323

## 2019-05-22 NOTE — ED Notes (Signed)
Attempted to call report x 1  

## 2019-05-22 NOTE — H&P (Addendum)
History and Physical    Russell Dillon NAT:[id number] DOB: 03/28/1946 DOA: 05/22/2019  PCP: Marton Redwood, MD Patient coming from: Home  I have personally briefly reviewed patient's old medical records in [place]  Chief Complaint: Altered mental status, fever  HPI: Russell Dillon is a 73 y.o. male with medical history significant of permanent atrial fibrillation, dementia, essential hypertension, HLD, diastolic congestive heart failure, aortic valve regurgitation, BPH, and recent small bowel obstruction with recent surgical intervention with lysis of adhesions who presents with chief complaint of progressive confusion and fever.  Patient with dementia, wife at bedside who gives majority of history of present illness.  Following recent hospitalization, was discharged on 05/20/2019 following small bowel obstruction.  Wife reports prior to discharge he was having bowel movements.  Since returning home he has not had a bowel movement and very poor oral intake.  Last night, spouse reports progressive confusion with a fever of 102.3.  History of urinary tract infections in the past.  Spouse denies any recent sick contacts at home or changes in his medications.    ED Course: Temperature 104.2, HR 150, RR 24, BP 110/79, SPO2 100% on room air.  WBC count 15.6, hemoglobin 10.7, platelets 310.  Sodium 136, potassium 4.0, chloride 106, CO2 20, BUN 26, creatinine 1.01, glucose 126.  Anion gap 10.  Total bilirubin 1.4.  Lactic acid 2.0.  Chest x-ray unrevealing.  Urinalysis and COVID-19 test pending.  Patient was started on cefepime, Flagyl, vancomycin.  He was given Tylenol 650 mg p.o. x1.  General surgery was consulted given his recent small bowel obstruction status post exploratory laparotomy. He was also given a 2.5 L normal saline bolus.  Patient referred to the hospital service for admission  Review of Systems: Unable to obtain any further ROS from patient given his confusion and altered  mental status and underlying dementia.  Past Medical History:  Diagnosis Date   Aortic valve regurgitation 10/16/2018   Echo 07/28/2017:  EF 60-65, moderate AI, aortic root and ascending aorta mildly dilated (40 mm), ascending aorta 43 mm, MAC, mild MR, mild LAE, moderate RV enlargement, trivial TR // Echo 12/19: EF 60-65, normal wall motion, mild AI, moderate BAE, ascending aorta 43 mm, aortic root 39 mm   BPH (benign prostatic hypertrophy)    Chronic anticoagulation    Chronic atrial fibrillation    Diastolic dysfunction October 2010   Normal LV systolic function   Hx SBO    Hyperlipidemia    Hypertension    Patellar tendon rupture, left, initial encounter 02/10/2019   Small bowel obstruction [facility])     Past Surgical History:  Procedure Laterality Date   APPENDECTOMY  4270   APPLICATION OF WOUND VAC Left 02/11/2019   Procedure: Application Of Wound Vac;  Surgeon: Altamese [place], MD;  Location: [place];  Service: Orthopedics;  Laterality: Left;   CARDIOVASCULAR STRESS TEST  09/30/2007   EF 67%  No ischemia.    CARDIOVERSION  05/01/2004   KNEE ARTHROSCOPY Left 02/11/2019   Procedure: ARTHROSCOPY LEFT KNEE;  Surgeon: Altamese , MD;  Location: [place];  Service: Orthopedics;  Laterality: Left;   LAPAROTOMY N/A 05/11/2019   Procedure: EXPLORATORY LAPAROTOMY;  Surgeon: Rolm Bookbinder, MD;  Location: [place];  Service: General;  Laterality: N/A;   LYSIS OF ADHESION N/A 05/11/2019   Procedure: LYSIS OF ADHESION;  Surgeon: Rolm Bookbinder, MD;  Location: [place];  Service: General;  Laterality: N/A;   PATELLAR TENDON REPAIR Left 02/11/2019  Procedure: LEFT PATELLA TENDON REPAIR;  Surgeon: Altamese [place], MD;  Location: [place];  Service: Orthopedics;  Laterality: Left;   SMALL INTESTINE SURGERY  2004   US ECHOCARDIOGRAPHY  09/05/2009   ef 55-60%     reports that he has never smoked. He has never used smokeless tobacco. He reports that he does not drink alcohol or use  drugs.  No Known Allergies  Family History  Problem Relation Age of Onset   Heart attack Mother    Hypertension Mother    Diabetes Father    Heart disease Father    Colon cancer Neg Hx    Esophageal cancer Neg Hx    Pancreatic cancer Neg Hx    Prostate cancer Neg Hx    Rectal cancer Neg Hx    Stomach cancer Neg Hx      Prior to Admission medications   Medication Sig Start Date End Date Taking? Authorizing Provider  acetaminophen (TYLENOL) 325 MG tablet Take 2 tablets (650 mg total) by mouth every 6 (six) hours as needed for mild pain, fever or headache. 03/02/19   Angiulli, Lavon Paganini, PA-C  allopurinol (ZYLOPRIM) 100 MG tablet Take 1 tablet (100 mg total) by mouth daily. Patient not taking: Reported on 05/05/2019 03/26/19   Radene Gunning, NP  Cholecalciferol (VITAMIN D-3) 25 MCG (1000 UT) CAPS Take 1,000 Units by mouth 2 (two) times a day.    [provider]  colchicine 0.6 MG tablet Take 1 tablet (0.6 mg total) by mouth 2 (two) times daily. 03/25/19   Dhungel, Nishant, MD  Cyanocobalamin (B-12 COMPLIANCE INJECTION) 1000 MCG/ML KIT Inject 1,000 mcg as directed every 30 (thirty) days.    [provider]  diltiazem (CARDIZEM CD) 120 MG 24 hr capsule Take 1 capsule (120 mg total) by mouth daily. 03/25/19 03/24/20  Black, Lezlie Octave, NP  docusate sodium (COLACE) 100 MG capsule Take 1 capsule (100 mg total) by mouth 2 (two) times daily. 05/20/19   Shelly Coss, MD  donepezil (ARICEPT) 10 MG tablet Take 1 tablet (10 mg total) by mouth daily. Patient taking differently: Take 10 mg by mouth at bedtime.  03/02/19   Angiulli, Lavon Paganini, PA-C  feeding supplement, ENSURE ENLIVE, (ENSURE ENLIVE) LIQD Take 237 mLs by mouth 2 (two) times daily between meals. Patient taking differently: Take 237 mLs by mouth 2 (two) times daily as needed (for supplementation). CHOCOLATE 03/22/19   Regalado, Belkys A, MD  finasteride (PROSCAR) 5 MG tablet Take 1 tablet (5 mg total) by mouth every  morning. 03/02/19   Angiulli, Lavon Paganini, PA-C  HYDROcodone-acetaminophen (NORCO/VICODIN) 5-325 MG tablet Take 1-2 tablets by mouth every 4 (four) hours as needed for moderate pain (pain score 4-6). Patient not taking: Reported on 05/05/2019 03/02/19   Angiulli, Lavon Paganini, PA-C  lithium carbonate 150 MG capsule TAKE 1 CAPSULE(150 MG) BY MOUTH AT BEDTIME Patient taking differently: Take 150 mg by mouth at bedtime.  04/19/19   Cottle, Billey Co., MD  Multiple Vitamins-Minerals (ICAPS AREDS 2 PO) Take 1 capsule by mouth daily.     [provider]  Omega-3 Fatty Acids (OMEGA-3 FISH OIL PO) Take 1 capsule by mouth daily with breakfast.     [provider]  polyethylene glycol powder (GLYCOLAX/MIRALAX) 17 GM/SCOOP powder Take 17 g by mouth daily. 05/20/19 06/19/19  Shelly Coss, MD  warfarin (COUMADIN) 2.5 MG tablet Take 1 tablet (2.5 mg total) by mouth daily. Patient taking differently: Take 2.5 mg by mouth daily at  6 PM.  03/02/19   Cathlyn Parsons, PA-C    Physical Exam: Vitals:   05/22/19 1345 05/22/19 1415 05/22/19 1516 05/22/19 1647  BP: 127/78 (!) 144/97 110/79   Pulse:  81 (!) 117   Resp: 20 (!) 22 (!) 24   Temp:    (!) 104.2 F (40.1 C)  TempSrc:    Rectal  SpO2:  94% 100%     Constitutional: NAD, calm, comfortable, confused Vitals:   05/22/19 1345 05/22/19 1415 05/22/19 1516 05/22/19 1647  BP: 127/78 (!) 144/97 110/79   Pulse:  81 (!) 117   Resp: 20 (!) 22 (!) 24   Temp:    (!) 104.2 F (40.1 C)  TempSrc:    Rectal  SpO2:  94% 100%    Eyes: PERRL, lids and conjunctivae normal ENMT: Mucous membranes are dry. Posterior pharynx clear of any exudate or lesions.Normal dentition.  Neck: normal, supple, no masses, no thyromegaly, patient not appropriately responding to commands, will not perform neck flexion but on passive range of motion patient appeared to be in pain and tearful and unable to passively flex neck to chest. Respiratory: clear to auscultation  bilaterally, no wheezing, no crackles. Normal respiratory effort. No accessory muscle use.  Cardiovascular: Tachycardic, irregularly irregular rhythm no murmurs / rubs / gallops. No extremity edema. 2+ pedal pulses. No carotid bruits.  Abdomen: Diffuse tenderness, anterior abdominal wound noted with packing in place, no purulence significant surrounding erythema, bowel sounds decreased/absent, no rebound/masses appreciated Musculoskeletal: no clubbing / cyanosis. No joint deformity upper and lower extremities. Good ROM, no contractures. Normal muscle tone.  Skin: Anterior abdominal wound noted with packing in place Neurologic: CN 2-12 grossly intact. Sensation intact, DTR normal. Strength 5/5 in all 4.  Psychiatric: Alert, normal mood, confused (which is more than his normal baseline of his underlying dementia per spouse)   Labs on Admission: I have personally reviewed following labs and imaging studies  CBC: Recent Labs  Lab 05/16/19 0505 05/17/19 0358 05/20/19 0447 05/22/19 1340  WBC 9.0 8.8 8.7 15.6*  NEUTROABS  --  6.2  --  14.4*  HGB 10.9* 11.6* 9.2* 10.7*  HCT 35.6* 37.0* 29.0* 33.8*  MCV 84.0 83.9 84.8 84.7  PLT 220 186 223 295   Basic Metabolic Panel: Recent Labs  Lab 05/17/19 0358 05/18/19 0421 05/20/19 0447 05/22/19 1340  NA 135 135 136 136  K 5.4* 4.6 3.8 4.0  CL 106 106 105 106  CO2 21* 20* 21* 20*  GLUCOSE 132* 117* 138* 126*  BUN 36* 41* 44* 26*  CREATININE 0.88 0.82 0.75 1.01  CALCIUM 9.4 9.4 9.1 9.2  MG 2.0  --  1.7  --   PHOS 4.3  --  3.5  --    GFR: Estimated Creatinine Clearance: 70.4 mL/min (by C-G formula based on SCr of 1.01 mg/dL). Liver Function Tests: Recent Labs  Lab 05/17/19 0358 05/20/19 0447 05/22/19 1340  AST 63* 20 18  ALT 121* 66* 43  ALKPHOS 100 116 118  BILITOT 1.0 0.6 1.4*  PROT 7.1 6.9 7.6  ALBUMIN 3.0* 2.9* 2.9*   No results for input(s): LIPASE, AMYLASE in the last 168 hours. No results for input(s): AMMONIA in the last  168 hours. Coagulation Profile: Recent Labs  Lab 05/19/19 0247 05/20/19 0447 05/22/19 1340  INR 1.1 1.1 1.4*   Cardiac Enzymes: No results for input(s): CKTOTAL, CKMB, CKMBINDEX, TROPONINI in the last 168 hours. BNP (last 3 results) No results for input(s): PROBNP in  the last 8760 hours. HbA1C: No results for input(s): HGBA1C in the last 72 hours. CBG: Recent Labs  Lab 05/19/19 1241 05/19/19 1831 05/20/19 0036 05/20/19 0545 05/20/19 1214  GLUCAP 135* 119* 132* 127* 103*   Lipid Profile: No results for input(s): CHOL, HDL, LDLCALC, TRIG, CHOLHDL, LDLDIRECT in the last 72 hours. Thyroid Function Tests: No results for input(s): TSH, T4TOTAL, FREET4, T3FREE, THYROIDAB in the last 72 hours. Anemia Panel: No results for input(s): VITAMINB12, FOLATE, FERRITIN, TIBC, IRON, RETICCTPCT in the last 72 hours. Urine analysis:    Component Value Date/Time   COLORURINE YELLOW 05/22/2019 1609   APPEARANCEUR CLOUDY (A) 05/22/2019 1609   LABSPEC 1.012 05/22/2019 1609   PHURINE 5.0 05/22/2019 1609   GLUCOSEU NEGATIVE 05/22/2019 1609   HGBUR SMALL (A) 05/22/2019 1609   BILIRUBINUR NEGATIVE 05/22/2019 1609   KETONESUR NEGATIVE 05/22/2019 1609   PROTEINUR NEGATIVE 05/22/2019 1609   NITRITE NEGATIVE 05/22/2019 1609   LEUKOCYTESUR LARGE (A) 05/22/2019 1609    Radiological Exams on Admission: Dg Chest Port 1 View  Result Date: 05/22/2019 CLINICAL DATA:  Fevers, history of recent surgery for small bowel obstruction EXAM: PORTABLE CHEST 1 VIEW COMPARISON:  05/12/2019 FINDINGS: Cardiac shadow is stable. Aortic calcifications are seen. Lungs are hypoinflated but clear. No acute abnormality noted. Right-sided PICC line has been removed in the interval. IMPRESSION: No acute abnormality noted. Electronically Signed   By: Inez Catalina M.D.   On: 05/22/2019 15:48    EKG: Independently reviewed.  Atrial fibrillation with RVR, rate 131.  No concerning ST elevation/depression or T wave  inversions  Assessment/Plan Active Problems:   Severe sepsis (HCC)  Acute metabolic encephalopathy Severe sepsis Patient presenting from home with recent hospitalization 2 days prior for small bowel obstruction status post lysis of adhesions/exploratory laparotomy.  Patient spouse reports increased confusion overnight that has significantly progressed associated with fever and poor oral intake.  Also reports no bowel movement since discharge and significant abdominal tenderness on physical exam.  Patient was noted to have a fever of 104.2, A. fib with RVR with rates into the 150s, elevated WBC count of 15.6, lactic acid of 2.0.  Chest x-ray unrevealing.  Also, concerns on physical exam for possible meningitis with apparent nuchal rigidity versus intra-abdominal pathology with recent surgery vs urinary tract infection. --Admit to progressive unit --COVID-19 test pending, continue contact/airborne isolation until test returns; although low suspicion given clear x-ray and oxygenating 100% on room air --Urinalysis: Pending --Blood cultures x2: Pending --Check respiratory viral panel --Check procalcitonin --CT head without contrast --IR for LP --Started on vancomycin, cefepime, Flagyl, acyclovir, ampicillin; pharmacy consulted for assistance with dosing/monitoring --N.p.o.; speech therapy evaluation for swallowing --Supportive care, antipyretics, LR at 75 mL's per hour  Recent small bowel obstruction status post exploratory laparotomy Patient was recently discharged on 05/20/2019 following small bowel obstruction status post exploratory laparotomy done by Dr. Donne Hazel.  Has anterior abdominal wound with packing in place.  Wound appears to be infected with no surrounding erythema or purulence. --General surgery consulted, Dr. Grandville Silos; appreciate assistance --CT abdomen/pelvis for evaluation for possible persistent bowel obstruction versus abscess formation --On broad-spectrum antibiotics as  above  Atrial fibrillation with RVR Patient was noted to have heart rates in the 150s on presentation.  Patient is on diltiazem 120 mg daily and Coumadin for chronic anticoagulation.  INR on admission 1.4. --Resume home diltiazem 120 mg p.o. daily --Start diltiazem drip and titrate to maintain heart rate less than 100 --We will hold Coumadin for now;  pending LP to evaluate for possible meningitis as above --Daily INR  Chronic diastolic congestive heart failure; compensated Last transthoracic echocardiogram 10/22/2018 with EF 60-65%, normal wall motion and diastolic dysfunction.  Chest x-ray on admission unrevealing.  Appears compensated at this time. 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  Patient had been taking in the past allopurinol, appears he has discontinued at some time.  He continues with colchicine 0.6 mg p.o. twice daily --Check uric acid level  BPH Foley catheter placed in ED.  Continue home finasteride 5 mg p.o. daily --Monitor strict I's and O's  Dementia Patient with history of dementia, currently lives at home with spouse. --Continue home donepezil 10 mg p.o. daily --Lithium 150 mg p.o. nightly --Given patient's dementia and fall risk, he is high risk for acute hospital acquired delirium and recommend that patient spouse be available at bedside as needed for assistance in the management of his underlying dementia which is likely exacerbated by his acute infection as above.  DVT prophylaxis: On Coumadin chronically for anticoagulation, will hold pending LP Code Status: Full code Family Communication: Discussed with spouse who is at bedside regarding plan of care Disposition Plan: To be determined Consults called: General surgery, Dr. Grandville Silos Admission status: Inpatient, progressive unit  Severity of Illness: The appropriate patient status for this patient is INPATIENT. Inpatient status  is judged to be reasonable and necessary in order to provide the required intensity of service to ensure the patient's safety. The patient's presenting symptoms, physical exam findings, and initial radiographic and laboratory data in the context of their chronic comorbidities is felt to place them at high risk for further clinical deterioration. Furthermore, it is not anticipated that the patient will be medically stable for discharge from the hospital within 2 midnights of admission. The following factors support the patient status of inpatient.   " The patient's presenting symptoms include altered mental status, high fever, no bowel movement with recent small bowel obstruction " The worrisome physical exam findings include confusion, fever of 104.2, meningismus, diffuse abdominal pain, open abdominal wound, lack of bowel sounds " The initial radiographic and laboratory data are worrisome because of WBC count 15.6, lactic acid 2.0 " The chronic co-morbidities include dementia, A. fib, BPH, diastolic congestive heart failure, advanced age.   * I certify that at the point of admission it is my clinical judgment that the patient will require inpatient hospital care spanning beyond 2 midnights from the point of admission due to high intensity of service, high risk for further deterioration and high frequency of surveillance required.*  Tonita Bills J [place] DO [facility] Pager [phone]  If 7PM-7AM, please contact night-coverage [email or web address] Password [facility]  05/22/2019, 6:00 PM

## 2019-05-22 NOTE — ED Triage Notes (Signed)
Patient from home with [facility] where he lives with wife. Patient underwent surgery on 7/6 for bowel obstruction, was discharge 7/9. Wife is caregiver, per EMS wife reports patient has history of dementia and is normally alert to self and family, but as of yesterday is more confused and appears to be reaching for objects that are not there. Rectal temperature 100.29F. Receive 250mg  tylenol at 0930 today.  Arrives with condom catheter in place. Patient nonambulatory x3 months after left knee surgery that made walking too painful.  EMS reports patient in rapid afib PTA. Heart rate 150 in ED. Patient alert, oriented to self only.

## 2019-05-22 NOTE — Consult Note (Signed)
[facility] [facility] Consult Note  [phone] M well known to our acute care surgery service status post exploratory laparotomy with lysis of adhesions by Dr. Donne Hazel on 6/30.  I was asked to see him by Dr. Jeanell Sparrow. He was just discharged from the hospital a couple days ago.  He returns today with fever and mental status changes.  His wife also reports he has not had a bowel movement since he left the hospital.  She has been managing his wound with wet-to-dry dressings as home health nursing had not contacted her until this morning.  Additionally, he has been using a condom catheter at home.  He is confused and unable to answer questions at this time.   On exam: Temperature 104.2, heart rate irregular 130s, respirations 24, O2 saturation 100%  Confused, no distress Lungs clear to auscultation Heart irregularly irregular Abdomen soft, nontender, nondistended, bowel sounds are present.  Midline wound is clean and granulating. Calves are soft without significant peripheral edema  A/P Status post exploratory laparotomy with lysis of adhesions on 6/30 by Dr. Donne Hazel.  Patient is being readmitted with fever and mental status changes.  Agree with medical work-up of infection and broad-spectrum antibiotics.  While urosepsis is most likely, agree with CT scan of the abdomen and pelvis to investigate further.  We will follow along with you and I spoke to his wife.  Georganna Skeans, MD, MPH, FACS Trauma & General Surgery: [phone]

## 2019-05-22 NOTE — Progress Notes (Signed)
Pharmacy Antibiotic Note  Russell Dillon is a 73 y.o. male admitted on 05/22/2019 after recent admission for ex lap with lysis of adhesions. Wife report patient is altered. Planning to initiate broad spectrum antibiotics. SCr 1, LA 2, WBC up to 15.6.   Plan: -Cefepime 2 g IV q8h -Ampicillin 2 g IV q6h -Acyclovir 10 mg IV q8h -Vancomycin 1500 mg IV x1 then 1g IV q12h -Monitor renal fx, cultures, deescalate as able, vancomycin trough at Css   Temp (24hrs), Avg:102.5 F (39.2 C), Min:100.8 F (38.2 C), Max:104.2 F (40.1 C)  Recent Labs  Lab 05/16/19 0505 05/17/19 0358 05/18/19 0421 05/20/19 0447 05/22/19 1340 05/22/19 1609  WBC 9.0 8.8  --  8.7 15.6*  --   CREATININE  --  0.88 0.82 0.75 1.01  --   LATICACIDVEN  --   --   --   --  1.6 2.0*    Estimated Creatinine Clearance: 70.4 mL/min (by C-G formula based on SCr of 1.01 mg/dL).     Antimicrobials this admission: 7/11 vancomycin > 7/11 cefepime > 7/11 acyclovir > 7/11 ampicillin >  Dose adjustments this admission: N/A  Microbiology results: 7/11 urine cx: 7/11 blood cx: 7/11 Covid-19: ip  Harvel Quale 05/22/2019 6:00 PM

## 2019-05-22 NOTE — ED Notes (Signed)
ED TO INPATIENT HANDOFF REPORT  ED Nurse Name and Phone #:  Patty 60  S Name/Age/Gender Russell Dillon 73 y.o. male Room/Bed: 047C/047C  Code Status   Code Status: Prior  Home/SNF/Other Home Patient oriented to: self Is this baseline? No   Triage Complete: Triage complete  Chief Complaint ALTERED/FEVER  Triage Note Patient from home with [facility] where he lives with wife. Patient underwent surgery on 7/6 for bowel obstruction, was discharge 7/9. Wife is caregiver, per EMS wife reports patient has history of dementia and is normally alert to self and family, but as of yesterday is more confused and appears to be reaching for objects that are not there. Rectal temperature 100.22F. Receive 286m tylenol at 0930 today.  Arrives with condom catheter in place. Patient nonambulatory x3 months after left knee surgery that made walking too painful.  EMS reports patient in rapid afib PTA. Heart rate 150 in ED. Patient alert, oriented to self only.   Allergies No Known Allergies  Level of Care/Admitting Diagnosis ED Disposition    [id number]  Level of Care: Progressive [102]  Covid Evaluation: Confirmed COVID Negative  Diagnosis: Sepsis ([facility]) [[id number]] Admitting Physician: GElwyn Reach[[id number]]  Attending Physician: GElwyn Reach[[id number]]  Estimated length of stay: past midnight tomorrow  Certification:: I certify this patient will need inpatient services for at least 2 midnights  PT Class (Do Not Modify): Inpatient [101]  PT Acc Code (Do Not Modify): Private [1]       B Medical/Surgery History Past Medical History:  Diagnosis Date  . Aortic valve regurgitation 10/16/2018   Echo 07/28/2017:  EF 60-65, moderate AI, aortic root and ascending aorta mildly dilated (40 mm), ascending aorta 43 mm, MAC, mild MR, mild LAE, moderate RV enlargement, trivial TR // Echo 12/19: EF 60-65, normal  wall motion, mild AI, moderate BAE, ascending aorta 43 mm, aortic root 39 mm  . BPH (benign prostatic hypertrophy)   . Chronic anticoagulation   . Chronic atrial fibrillation   . Diastolic dysfunction October 2010   Normal LV systolic function  . Hx SBO   . Hyperlipidemia   . Hypertension   . Patellar tendon rupture, left, initial encounter 02/10/2019  . Small bowel obstruction ([facility]    Past Surgical History:  Procedure Laterality Date  . APPENDECTOMY  2004  . APPLICATION OF WOUND VAC Left 02/11/2019   Procedure: Application Of Wound Vac;  Surgeon: HAltamese Corning MD;  [place]  Service: Orthopedics;  Laterality: Left;  . CARDIOVASCULAR STRESS TEST  09/30/2007   EF 67%  No ischemia.   .Marland KitchenCARDIOVERSION  05/01/2004  . KNEE ARTHROSCOPY Left 02/11/2019   Procedure: ARTHROSCOPY LEFT KNEE;  Surgeon: HAltamese Hardy MD;  [place]  Service: Orthopedics;  Laterality: Left;  . LAPAROTOMY N/A 05/11/2019   Procedure: EXPLORATORY LAPAROTOMY;  Surgeon: WRolm Bookbinder MD;  [facility]  Service: General;  Laterality: N/A;  . LYSIS OF ADHESION N/A 05/11/2019   Procedure: LYSIS OF ADHESION;  Surgeon: WRolm Bookbinder MD;  [place] [facility]  Service: General;  Laterality: N/A;  . PATELLAR TENDON REPAIR Left 02/11/2019   Procedure: LEFT PATELLA TENDON REPAIR;  Surgeon: HAltamese Silverdale MD;  [place] [facility]  Service: Orthopedics;  Laterality: Left;  . SMALL INTESTINE SURGERY  2004  . UKoreaECHOCARDIOGRAPHY  09/05/2009   ef 55-60%     A IV Location/Drains/Wounds Patient Lines/Drains/Airways Status  Active Line/Drains/Airways    Name:   Placement date:   Placement time:   Site:   Days:   Peripheral IV 05/22/19 Left Forearm   05/22/19    -    Forearm   less than 1   Peripheral IV 05/22/19 Right Forearm   05/22/19    1347    Forearm   less than 1   External Urinary Catheter   05/18/19    1338    -   4   Incision (Closed) 05/11/19 Abdomen Other (Comment)   05/11/19    1545     11           Intake/Output Last 24 hours  Intake/Output Summary (Last 24 hours) at 05/22/2019 2111 Last data filed at 05/22/2019 2110 Gross per 24 hour  Intake 3700 ml  Output -  Net 3700 ml    Labs/Imaging Results for orders placed or performed during the hospital encounter of 05/22/19 (from the past 48 hour(s))  Comprehensive metabolic panel     Status: Abnormal   Collection Time: 05/22/19  1:40 PM  Result Value Ref Range   Sodium 136 135 - 145 mmol/L   Potassium 4.0 3.5 - 5.1 mmol/L   Chloride 106 98 - 111 mmol/L   CO2 20 (L) 22 - 32 mmol/L   Glucose, Bld 126 (H) 70 - 99 mg/dL   BUN 26 (H) 8 - 23 mg/dL   Creatinine, Ser 1.01 0.61 - 1.24 mg/dL   Calcium 9.2 8.9 - 10.3 mg/dL   Total Protein 7.6 6.5 - 8.1 g/dL   Albumin 2.9 (L) 3.5 - 5.0 g/dL   AST 18 15 - 41 U/L   ALT 43 0 - 44 U/L   Alkaline Phosphatase 118 38 - 126 U/L   Total Bilirubin 1.4 (H) 0.3 - 1.2 mg/dL   GFR calc non Af Amer >60 >60 mL/min   GFR calc Af Amer >60 >60 mL/min   Anion gap 10 5 - 15    Comment: Performed at [facility], [address], [place], [place] [address]  Lactic acid, plasma     Status: None   Collection Time: 05/22/19  1:40 PM  Result Value Ref Range   Lactic Acid, Venous 1.6 0.5 - 1.9 mmol/L    Comment: Performed at [facility] [address] [place], [place] [address]  CBC WITH DIFFERENTIAL     Status: Abnormal   Collection Time: 05/22/19  1:40 PM  Result Value Ref Range   WBC 15.6 (H) 4.0 - 10.5 K/uL   RBC 3.99 (L) 4.22 - 5.81 MIL/uL   Hemoglobin 10.7 (L) 13.0 - 17.0 g/dL   HCT 33.8 (L) 39.0 - 52.0 %   MCV 84.7 80.0 - 100.0 fL   MCH 26.8 26.0 - 34.0 pg   MCHC 31.7 30.0 - 36.0 g/dL   RDW 21.2 (H) 11.5 - 15.5 %   Platelets 310 150 - 400 K/uL   nRBC 0.0 0.0 - 0.2 %   Neutrophils Relative % 92 %   Neutro Abs 14.4 (H) 1.7 - 7.7 K/uL   Lymphocytes Relative 3 %   Lymphs Abs 0.4 (L) 0.7 - 4.0 K/uL   Monocytes Relative 4 %   Monocytes Absolute 0.7 0.1 - 1.0 K/uL    Eosinophils Relative 0 %   Eosinophils Absolute 0.0 0.0 - 0.5 K/uL   Basophils Relative 0 %   Basophils Absolute 0.0 0.0 - 0.1 K/uL   Immature Granulocytes  1 %   Abs Immature Granulocytes 0.08 (H) 0.00 - 0.07 K/uL   Polychromasia PRESENT     Comment: Performed at [facility], [place] [address] [place], [place] [address]  APTT     Status: Abnormal   Collection Time: 05/22/19  1:40 PM  Result Value Ref Range   aPTT 45 (H) 24 - 36 seconds    Comment:        IF BASELINE aPTT IS ELEVATED, SUGGEST PATIENT RISK ASSESSMENT BE USED TO DETERMINE APPROPRIATE ANTICOAGULANT THERAPY. Performed at [facility], [place] [address], [place], [place] [address]   Protime-INR     Status: Abnormal   Collection Time: 05/22/19  1:40 PM  Result Value Ref Range   Prothrombin Time 16.9 (H) 11.4 - 15.2 seconds   INR 1.4 (H) 0.8 - 1.2    Comment: (NOTE) INR goal varies based on device and disease states. Performed at [facility], [place] [address], [place], [place] [address]   Lactic acid, plasma     Status: Abnormal   Collection Time: 05/22/19  4:09 PM  Result Value Ref Range   Lactic Acid, Venous 2.0 (HH) 0.5 - 1.9 mmol/L    Comment: CRITICAL RESULT CALLED TO, READ BACK BY AND VERIFIED WITH: Freddy Jaksch RN @ 7425 ON 05/22/2019 BY TEMOCHE, H Performed at [facility], [place] [address], [place], [place] [address]   Urinalysis, Routine w reflex microscopic     Status: Abnormal   Collection Time: 05/22/19  4:09 PM  Result Value Ref Range   Color, Urine YELLOW YELLOW   APPearance CLOUDY (A) CLEAR   Specific Gravity, Urine 1.012 1.005 - 1.030   pH 5.0 5.0 - 8.0   Glucose, UA NEGATIVE NEGATIVE mg/dL   Hgb urine dipstick SMALL (A) NEGATIVE   Bilirubin Urine NEGATIVE NEGATIVE   Ketones, ur NEGATIVE NEGATIVE mg/dL   Protein, ur NEGATIVE NEGATIVE mg/dL   Nitrite NEGATIVE NEGATIVE   Leukocytes,Ua LARGE (A) NEGATIVE   RBC / HPF 21-50 0 - 5 RBC/hpf   WBC, UA >50 (H) 0 - 5 WBC/hpf   Bacteria,  UA RARE (A) NONE SEEN   WBC Clumps PRESENT    Mucus PRESENT     Comment: Performed at [facility], [address], [place], [place] [address]  SARS Coronavirus 2 (CEPHEID- Performed in [place] hospital lab), Hosp Order     Status: None   Collection Time: 05/22/19  6:02 PM   Specimen: Nasopharyngeal Swab  Result Value Ref Range   SARS Coronavirus 2 NEGATIVE NEGATIVE    Comment: (NOTE) If result is NEGATIVE SARS-CoV-2 target nucleic acids are NOT DETECTED. The SARS-CoV-2 RNA is generally detectable in upper and lower  respiratory specimens during the acute phase of infection. The lowest  concentration of SARS-CoV-2 viral copies this assay can detect is 250  copies / mL. A negative result does not preclude SARS-CoV-2 infection  and should not be used as the sole basis for treatment or other  patient management decisions.  A negative result may occur with  improper specimen collection / handling, submission of specimen other  than nasopharyngeal swab, presence of viral mutation(s) within the  areas targeted by this assay, and inadequate number of viral copies  (<250 copies / mL). A negative result must be combined with clinical  observations, patient history, and epidemiological information. If result is POSITIVE SARS-CoV-2 target nucleic acids are DETECTED. The SARS-CoV-2 RNA is generally detectable in  upper and lower  respiratory specimens dur ing the acute phase of infection.  Positive  results are indicative of active infection with SARS-CoV-2.  Clinical  correlation with patient history and other diagnostic information is  necessary to determine patient infection status.  Positive results do  not rule out bacterial infection or co-infection with other viruses. If result is PRESUMPTIVE POSTIVE SARS-CoV-2 nucleic acids MAY BE PRESENT.   A presumptive positive result was obtained on the submitted specimen  and confirmed on repeat testing.  While 2019 novel coronavirus   (SARS-CoV-2) nucleic acids may be present in the submitted sample  additional confirmatory testing may be necessary for epidemiological  and / or clinical management purposes  to differentiate between  SARS-CoV-2 and other Sarbecovirus currently known to infect humans.  If clinically indicated additional testing with an alternate test  methodology [phone]) is advised. The SARS-CoV-2 RNA is generally  detectable in upper and lower respiratory sp ecimens during the acute  phase of infection. The expected result is Negative. Fact Sheet for Patients:  StrictlyIdeas.no Fact Sheet for Healthcare Providers: BankingDealers.co.za This test is not yet approved or cleared by the [place] FDA and has been authorized for detection and/or diagnosis of SARS-CoV-2 by FDA under an Emergency Use Authorization (EUA).  This EUA will remain in effect (meaning this test can be used) for the duration of the COVID-19 declaration under Section 564(b)(1) of the Act, 21 U.S.C. section 360bbb-3(b)(1), unless the authorization is terminated or revoked sooner. Performed at [facility], [place] [address] [place], [place] [address]    Dg Chest Port 1 View  Result Date: 05/22/2019 CLINICAL DATA:  Fevers, history of recent surgery for small bowel obstruction EXAM: PORTABLE CHEST 1 VIEW COMPARISON:  05/12/2019 FINDINGS: Cardiac shadow is stable. Aortic calcifications are seen. Lungs are hypoinflated but clear. No acute abnormality noted. Right-sided PICC line has been removed in the interval. IMPRESSION: No acute abnormality noted. Electronically Signed   By: Inez Catalina M.D.   On: 05/22/2019 15:48    Pending Labs Unresulted Labs (From admission, onward)    Start     Ordered   05/22/19 1320  Blood Culture (routine x 2)  BLOOD CULTURE X 2,   STAT     05/22/19 1321   05/22/19 1320  Urine culture  ONCE - STAT,   STAT     05/22/19 1321   Signed and Held   Lactic acid, plasma  STAT Now then every 3 hours,   STAT     Signed and Held   Signed and Held  Procalcitonin  ONCE - STAT,   R     Signed and Held   Signed and Held  Respiratory Panel by PCR  (Respiratory virus panel with precautions)  Once,   R     Signed and Held   Signed and Held  Procalcitonin - Baseline  ONCE - STAT,   STAT     Signed and Held   Signed and Held  Procalcitonin  Daily,   R     Signed and Held   Signed and Held  Uric acid  Once,   R     Signed and Held   Signed and Held  Basic metabolic panel  Daily,   R     Signed and Held   Signed and Held  CBC with Differential/Platelet  Daily,   R     Signed and Held   Signed and Held  Protime-INR  Daily,  R     Signed and Held          Vitals/Pain Today's Vitals   05/22/19 1945 05/22/19 1952 05/22/19 2015 05/22/19 2030  BP: 112/62  94/64 (!) 98/59  Pulse: (!) 140  (!) 114 (!) 118  Resp:   (!) 22 20  Temp:  (!) 102.6 F (39.2 C)    TempSrc:  Rectal    SpO2: 100%  100% 100%  PainSc:        Isolation Precautions No active isolations  Medications Medications  sodium chloride flush (NS) 0.9 % injection 3 mL (3 mLs Intravenous Not Given 05/22/19 1647)  diltiazem (CARDIZEM) 1 mg/mL load via infusion 10 mg (10 mg Intravenous Bolus from Bag 05/22/19 1800)    And  diltiazem (CARDIZEM) 100 mg in dextrose 5% 11m (1 mg/mL) infusion (0 mg/hr Intravenous Paused 05/22/19 1938)  acyclovir (ZOVIRAX) 795 mg in dextrose 5 % 150 mL IVPB (has no administration in time range)  ampicillin (OMNIPEN) 2 g in sodium chloride 0.9 % 100 mL IVPB (has no administration in time range)  ceFEPIme (MAXIPIME) 2 g in sodium chloride 0.9 % 100 mL IVPB (has no administration in time range)  vancomycin (VANCOCIN) IVPB 1000 mg/200 mL premix (has no administration in time range)  sodium chloride 0.9 % bolus 1,000 mL (0 mLs Intravenous Stopped 05/22/19 1412)    And  sodium chloride 0.9 % bolus 1,000 mL (0 mLs Intravenous Stopped 05/22/19 1515)    And   sodium chloride 0.9 % bolus 500 mL (0 mLs Intravenous Stopped 05/22/19 1647)  ceFEPIme (MAXIPIME) 2 g in sodium chloride 0.9 % 100 mL IVPB (0 g Intravenous Stopped 05/22/19 1515)  metroNIDAZOLE (FLAGYL) IVPB 500 mg (0 mg Intravenous Stopped 05/22/19 1516)  vancomycin (VANCOCIN) 1,500 mg in sodium chloride 0.9 % 500 mL IVPB (0 mg Intravenous Stopped 05/22/19 1824)  acetaminophen (TYLENOL) tablet 650 mg (650 mg Oral Given 05/22/19 1756)  ketorolac (TORADOL) 30 MG/ML injection 15 mg (15 mg Intravenous Given 05/22/19 2023)  sodium chloride 0.9 % bolus 500 mL (0 mLs Intravenous Stopped 05/22/19 2110)    Mobility non-ambulatory High fall risk   Focused Assessments    R Recommendations: See Admitting Provider Note  Report given to:   Additional Notes:

## 2019-05-22 NOTE — ED Notes (Signed)
Portable Xray at bedside.

## 2019-05-22 NOTE — ED Notes (Signed)
Family at bedside. 

## 2019-05-23 ENCOUNTER — Inpatient Hospital Stay (HOSPITAL_COMMUNITY): Payer: Medicare Other

## 2019-05-23 ENCOUNTER — Encounter (HOSPITAL_COMMUNITY): Payer: Self-pay | Admitting: Radiology

## 2019-05-23 DIAGNOSIS — R6520 Severe sepsis without septic shock: Secondary | ICD-10-CM

## 2019-05-23 DIAGNOSIS — G9341 Metabolic encephalopathy: Secondary | ICD-10-CM

## 2019-05-23 DIAGNOSIS — A419 Sepsis, unspecified organism: Secondary | ICD-10-CM

## 2019-05-23 DIAGNOSIS — I4891 Unspecified atrial fibrillation: Secondary | ICD-10-CM

## 2019-05-23 DIAGNOSIS — R6521 Severe sepsis with septic shock: Secondary | ICD-10-CM

## 2019-05-23 LAB — CBC WITH DIFFERENTIAL/PLATELET
Abs Immature Granulocytes: 0.08 10*3/uL — ABNORMAL HIGH (ref 0.00–0.07)
Basophils Absolute: 0 10*3/uL (ref 0.0–0.1)
Basophils Relative: 0 %
Eosinophils Absolute: 0 10*3/uL (ref 0.0–0.5)
Eosinophils Relative: 0 %
HCT: 29.1 % — ABNORMAL LOW (ref 39.0–52.0)
Hemoglobin: 9.3 g/dL — ABNORMAL LOW (ref 13.0–17.0)
Immature Granulocytes: 1 %
Lymphocytes Relative: 3 %
Lymphs Abs: 0.4 10*3/uL — ABNORMAL LOW (ref 0.7–4.0)
MCH: 26.9 pg (ref 26.0–34.0)
MCHC: 32 g/dL (ref 30.0–36.0)
MCV: 84.1 fL (ref 80.0–100.0)
Monocytes Absolute: 0.5 10*3/uL (ref 0.1–1.0)
Monocytes Relative: 3 %
Neutro Abs: 15.7 10*3/uL — ABNORMAL HIGH (ref 1.7–7.7)
Neutrophils Relative %: 93 %
Platelets: 234 10*3/uL (ref 150–400)
RBC: 3.46 MIL/uL — ABNORMAL LOW (ref 4.22–5.81)
RDW: 21.4 % — ABNORMAL HIGH (ref 11.5–15.5)
WBC: 16.8 10*3/uL — ABNORMAL HIGH (ref 4.0–10.5)
nRBC: 0 % (ref 0.0–0.2)

## 2019-05-23 LAB — BASIC METABOLIC PANEL
Anion gap: 8 (ref 5–15)
Anion gap: 10 (ref 5–15)
BUN: 22 mg/dL (ref 8–23)
BUN: 17 mg/dL (ref 8–23)
CO2: 17 mmol/L — ABNORMAL LOW (ref 22–32)
CO2: 15 mmol/L — ABNORMAL LOW (ref 22–32)
Calcium: 8.2 mg/dL — ABNORMAL LOW (ref 8.9–10.3)
Calcium: 8.2 mg/dL — ABNORMAL LOW (ref 8.9–10.3)
Chloride: 111 mmol/L (ref 98–111)
Chloride: 112 mmol/L — ABNORMAL HIGH (ref 98–111)
Creatinine, Ser: 1 mg/dL (ref 0.61–1.24)
Creatinine, Ser: 0.87 mg/dL (ref 0.61–1.24)
GFR calc Af Amer: >60 mL/min (ref >60)
GFR calc Af Amer: >60 mL/min (ref >60)
GFR calc non Af Amer: >60 mL/min (ref >60)
GFR calc non Af Amer: >60 mL/min (ref >60)
Glucose, Bld: 119 mg/dL — ABNORMAL HIGH (ref 70–99)
Glucose, Bld: 106 mg/dL — ABNORMAL HIGH (ref 70–99)
Potassium: 3.7 mmol/L (ref 3.5–5.1)
Potassium: 4 mmol/L (ref 3.5–5.1)
Sodium: 136 mmol/L (ref 135–145)
Sodium: 137 mmol/L (ref 135–145)

## 2019-05-23 LAB — RENAL FUNCTION PANEL
Albumin: 2.4 g/dL — ABNORMAL LOW (ref 3.5–5.0)
Anion gap: 11 (ref 5–15)
BUN: 21 mg/dL (ref 8–23)
CO2: 14 mmol/L — ABNORMAL LOW (ref 22–32)
Calcium: 8.1 mg/dL — ABNORMAL LOW (ref 8.9–10.3)
Chloride: 110 mmol/L (ref 98–111)
Creatinine, Ser: 0.9 mg/dL (ref 0.61–1.24)
GFR calc Af Amer: >60 mL/min (ref >60)
GFR calc non Af Amer: >60 mL/min (ref >60)
Glucose, Bld: 149 mg/dL — ABNORMAL HIGH (ref 70–99)
Phosphorus: 2.7 mg/dL (ref 2.5–4.6)
Potassium: 3.3 mmol/L — ABNORMAL LOW (ref 3.5–5.1)
Sodium: 135 mmol/L (ref 135–145)

## 2019-05-23 LAB — RESPIRATORY PANEL BY PCR

## 2019-05-23 LAB — BLOOD CULTURE ID PANEL (REFLEXED)

## 2019-05-23 LAB — GLUCOSE, CAPILLARY: Glucose-Capillary: 110 mg/dL — ABNORMAL HIGH (ref 70–99)

## 2019-05-23 LAB — MAGNESIUM: Magnesium: 1.4 mg/dL — ABNORMAL LOW (ref 1.7–2.4)

## 2019-05-23 LAB — TYPE AND SCREEN
ABO/RH(D): A POS
Antibody Screen: NEGATIVE

## 2019-05-23 LAB — CORTISOL: Cortisol, Plasma: 17.8 ug/dL

## 2019-05-23 LAB — PROCALCITONIN: Procalcitonin: 2.83 ng/mL

## 2019-05-23 LAB — PROTIME-INR
INR: 1.8 — ABNORMAL HIGH (ref 0.8–1.2)
Prothrombin Time: 20.9 seconds — ABNORMAL HIGH (ref 11.4–15.2)

## 2019-05-23 LAB — LACTIC ACID, PLASMA: Lactic Acid, Venous: 2.3 mmol/L (ref 0.5–1.9)

## 2019-05-23 MED ORDER — DEXMEDETOMIDINE HCL IN NACL 400 MCG/100ML IV SOLN
0.4000 ug/kg/h | INTRAVENOUS | Status: DC
  Administered 2019-05-23: 0.6 ug/kg/h via INTRAVENOUS
  Administered 2019-05-23: 0.8 ug/kg/h via INTRAVENOUS
  Filled 2019-05-23 (×3): qty 100

## 2019-05-23 MED ORDER — ORAL CARE MOUTH RINSE
15.0000 mL | Freq: Two times a day (BID) | OROMUCOSAL | Status: DC
  Administered 2019-05-23 – 2019-05-29 (×9): 15 mL via OROMUCOSAL

## 2019-05-23 MED ORDER — MAGNESIUM SULFATE 2 GM/50ML IV SOLN
2.0000 g | Freq: Once | INTRAVENOUS | Status: AC
  Administered 2019-05-23: 2 g via INTRAVENOUS
  Filled 2019-05-23: qty 50

## 2019-05-23 MED ORDER — CHLORHEXIDINE GLUCONATE CLOTH 2 % EX PADS
6.0000 | MEDICATED_PAD | Freq: Every day | CUTANEOUS | Status: DC
  Administered 2019-05-23 – 2019-05-29 (×5): 6 via TOPICAL

## 2019-05-23 MED ORDER — FAMOTIDINE IN NACL 20-0.9 MG/50ML-% IV SOLN
20.0000 mg | Freq: Two times a day (BID) | INTRAVENOUS | Status: DC
  Administered 2019-05-23 – 2019-05-24 (×3): 20 mg via INTRAVENOUS
  Filled 2019-05-23 (×4): qty 50

## 2019-05-23 MED ORDER — AMIODARONE HCL IN DEXTROSE 360-4.14 MG/200ML-% IV SOLN
30.0000 mg/h | INTRAVENOUS | Status: DC
  Administered 2019-05-23 – 2019-05-24 (×2): 30 mg/h via INTRAVENOUS
  Filled 2019-05-23 (×7): qty 200

## 2019-05-23 MED ORDER — SODIUM CHLORIDE 0.9 % IV SOLN
2.0000 g | INTRAVENOUS | Status: DC
  Filled 2019-05-23 (×4): qty 2000

## 2019-05-23 MED ORDER — SODIUM CHLORIDE 0.9 % IV SOLN
3.0000 g | Freq: Four times a day (QID) | INTRAVENOUS | Status: DC
  Administered 2019-05-23 – 2019-05-25 (×9): 3 g via INTRAVENOUS
  Filled 2019-05-23 (×5): qty 8
  Filled 2019-05-23 (×2): qty 3
  Filled 2019-05-23 (×3): qty 8
  Filled 2019-05-23: qty 3

## 2019-05-23 MED ORDER — IOHEXOL 300 MG/ML  SOLN
100.0000 mL | Freq: Once | INTRAMUSCULAR | Status: AC | PRN
  Administered 2019-05-23: 100 mL via INTRAVENOUS

## 2019-05-23 MED ORDER — AMIODARONE HCL IN DEXTROSE 360-4.14 MG/200ML-% IV SOLN
60.0000 mg/h | INTRAVENOUS | Status: AC
  Administered 2019-05-23: 36 mg/h via INTRAVENOUS
  Administered 2019-05-23: 60 mg/h via INTRAVENOUS
  Filled 2019-05-23: qty 200

## 2019-05-23 MED ORDER — AMIODARONE LOAD VIA INFUSION
150.0000 mg | Freq: Once | INTRAVENOUS | Status: AC
  Administered 2019-05-23: 150 mg via INTRAVENOUS
  Filled 2019-05-23: qty 83.34

## 2019-05-23 MED ORDER — POTASSIUM CHLORIDE 10 MEQ/100ML IV SOLN
10.0000 meq | INTRAVENOUS | Status: AC
  Administered 2019-05-23 (×4): 10 meq via INTRAVENOUS
  Filled 2019-05-23 (×3): qty 100

## 2019-05-23 MED ORDER — METOPROLOL TARTRATE 5 MG/5ML IV SOLN: 5.0000 mg | Freq: Four times a day (QID) | INTRAVENOUS | Status: DC | PRN

## 2019-05-23 MED ORDER — POTASSIUM CHLORIDE 10 MEQ/100ML IV SOLN
10.0000 meq | INTRAVENOUS | Status: AC
  Administered 2019-05-23 (×2): 10 meq via INTRAVENOUS
  Filled 2019-05-23 (×3): qty 100

## 2019-05-23 MED ORDER — PHENYLEPHRINE HCL-NACL 10-0.9 MG/250ML-% IV SOLN
20.0000 ug/min | INTRAVENOUS | Status: DC
  Administered 2019-05-23: 70 ug/min via INTRAVENOUS
  Administered 2019-05-23: 50 ug/min via INTRAVENOUS
  Administered 2019-05-23: 10 ug/min via INTRAVENOUS
  Filled 2019-05-23 (×3): qty 250

## 2019-05-23 MED ORDER — SODIUM CHLORIDE 0.9 % IV SOLN: INTRAVENOUS | Status: DC | PRN

## 2019-05-23 NOTE — Progress Notes (Signed)
[facility]/Trauma Progress Note      Assessment/Plan Atrial fibrillation  HTN HLD BPH Dementia Recent left patella tendon repair 02/11/2019 Malnutrition   S/P exploratory laparotomy with lysis of adhesions by Dr. Donne Hazel on 6/30 -BID wet to dry dressing changes - Pt was just discharged from the hospital a couple days ago.  He returns with fever and mental status changes. No BM since discharge  Acute metabolic encephalopathy Severe sepsis  - on admission fever of 104.2, A. fib with RVR with rates into the 150s, elevated WBC count of 15.6, lactic acid of 2.0, possible urosepsis  - CT of abdomen showed partial SBO vs ileus, no other acute abnormality noted - agree with broad spectrum antibiotics, do not suspect an intraabdominal source of his sepsis at this time, pt was non tender on exam - we will follow  FEN: NPO, ice chips okay from our standpoint VTE: per CCM ID: [email or web address] Follow up: Dr. Nat Christen - wife Jaden Abreu [phone] or [phone]    LOS: 1 day    Subjective/chief complaint  Pt states abdominal pain but when I palpate his abdomen he is nontender. Pt is sleepy and difficult to wake.   Objective: Vital signs in last 24 hours: Temp:  [98.2 F (36.8 C)-104.2 F (40.1 C)] 98.4 F (36.9 C) (07/12 0749) Pulse Rate:  [55-162] 91 (07/12 0800) Resp:  [6-34] 20 (07/12 0800) BP: (67-144)/(24-126) 109/70 (07/12 0800) SpO2:  [93 %-100 %] 100 % (07/12 0800) Weight:  [81.7 kg] 81.7 kg (07/12 0245) Last BM Date: (PTA)  Intake/Output from previous day: 07/11 0701 - 07/12 0700 In: 6008.1 [P.O.:50; I.V.:408.1; IV Piggyback:5550] Out: 50 [Urine:710] Intake/Output this shift: Total I/O In: 991.6 [I.V.:725.7; IV Piggyback:265.9] Out: 240 [Urine:240]  PE: Gen:  appears ill, NAD Pulm:  Rate and effort normal Abd: Soft, NT/ND, +BS, midline incision with good base of granulation tissue and no purulent drainage noted.  Skin: warm and  dry   Anti-infectives: Anti-infectives (From admission, onward)   Start     Dose/Rate Route Frequency Ordered Stop   05/23/19 1000  Ampicillin-Sulbactam (UNASYN) 3 g in sodium chloride 0.9 % 100 mL IVPB     3 g 200 mL/hr over 30 Minutes Intravenous Every 6 hours 05/23/19 0843     05/23/19 0800  ampicillin (OMNIPEN) 2 g in sodium chloride 0.9 % 100 mL IVPB  Status:  Discontinued     2 g 300 mL/hr over 20 Minutes Intravenous Every 4 hours 05/23/19 0610 05/23/19 0843   05/23/19 0400  vancomycin (VANCOCIN) IVPB 1000 mg/200 mL premix  Status:  Discontinued     1,000 mg 200 mL/hr over 60 Minutes Intravenous Every 12 hours 05/22/19 1813 05/23/19 0843   05/22/19 2200  ampicillin (OMNIPEN) 2 g in sodium chloride 0.9 % 100 mL IVPB     2 g 300 mL/hr over 20 Minutes Intravenous  Once 05/22/19 2153 05/22/19 2312   05/22/19 2200  acyclovir (ZOVIRAX) 795 mg in dextrose 5 % 150 mL IVPB     10 mg/kg  79.3 kg 165.9 mL/hr over 60 Minutes Intravenous  Once 05/22/19 2153 05/23/19 0032   05/22/19 2200  metroNIDAZOLE (FLAGYL) IVPB 500 mg  Status:  Discontinued     500 mg 100 mL/hr over 60 Minutes Intravenous Every 8 hours 05/22/19 2153 05/23/19 0843   05/22/19 2200  ceFEPIme (MAXIPIME) 2 g in sodium chloride 0.9 % 100 mL IVPB  Status:  Discontinued     2 g 200 mL/hr over  30 Minutes Intravenous Every 8 hours 05/22/19 1813 05/23/19 0843   05/22/19 1900  acyclovir (ZOVIRAX) 795 mg in dextrose 5 % 150 mL IVPB  Status:  Discontinued     10 mg/kg  79.3 kg 165.9 mL/hr over 60 Minutes Intravenous Every 8 hours 05/22/19 1813 05/23/19 0843   05/22/19 1900  ampicillin (OMNIPEN) 2 g in sodium chloride 0.9 % 100 mL IVPB  Status:  Discontinued     2 g 300 mL/hr over 20 Minutes Intravenous Every 6 hours 05/22/19 1813 05/23/19 0610   05/22/19 1400  vancomycin (VANCOCIN) 1,500 mg in sodium chloride 0.9 % 500 mL IVPB     1,500 mg 250 mL/hr over 120 Minutes Intravenous  Once 05/22/19 1332 05/22/19 1824   05/22/19 1330   ceFEPIme (MAXIPIME) 2 g in sodium chloride 0.9 % 100 mL IVPB     2 g 200 mL/hr over 30 Minutes Intravenous  Once 05/22/19 1321 05/22/19 1515   05/22/19 1330  metroNIDAZOLE (FLAGYL) IVPB 500 mg     500 mg 100 mL/hr over 60 Minutes Intravenous  Once 05/22/19 1321 05/22/19 1516   05/22/19 1330  vancomycin (VANCOCIN) IVPB 1000 mg/200 mL premix  Status:  Discontinued     1,000 mg 200 mL/hr over 60 Minutes Intravenous  Once 05/22/19 1321 05/22/19 1332      Lab Results:  Recent Labs    05/22/19 1340 05/23/19 0145  WBC 15.6* 16.8*  HGB 10.7* 9.3*  HCT 33.8* 29.1*  PLT 310 234   BMET Recent Labs    05/23/19 0145 05/23/19 0525  NA 136 135  K 3.7 3.3*  CL 111 110  CO2 17* 14*  GLUCOSE 119* 149*  BUN 22 21  CREATININE 1.00 0.90  CALCIUM 8.2* 8.1*   PT/INR Recent Labs    05/22/19 1340 05/23/19 0145  LABPROT 16.9* 20.9*  INR 1.4* 1.8*   CMP     Component Value Date/Time   NA 135 05/23/2019 0525   NA 135 07/16/2017 1348   K 3.3 (L) 05/23/2019 0525   CL 110 05/23/2019 0525   CO2 14 (L) 05/23/2019 0525   GLUCOSE 149 (H) 05/23/2019 0525   BUN 21 05/23/2019 0525   BUN 13 07/16/2017 1348   CREATININE 0.90 05/23/2019 0525   CALCIUM 8.1 (L) 05/23/2019 0525   CALCIUM 8.2 (L) 02/10/2019 0302   PROT 7.6 05/22/2019 1340   ALBUMIN 2.4 (L) 05/23/2019 0525   AST 18 05/22/2019 1340   ALT 43 05/22/2019 1340   ALKPHOS 118 05/22/2019 1340   BILITOT 1.4 (H) 05/22/2019 1340   GFRNONAA >60 05/23/2019 0525   GFRAA >60 05/23/2019 0525   Lipase     Component Value Date/Time   LIPASE 24 05/05/2019 1707    Studies/Results: Ct Head Wo Contrast  Result Date: 05/23/2019 CLINICAL DATA:  Altered mental status. Recent laparotomy on 05/11/2019 EXAM: CT HEAD WITHOUT CONTRAST TECHNIQUE: Contiguous axial images were obtained from the base of the skull through the vertex without intravenous contrast. COMPARISON:  Head CT, 03/18/2019 FINDINGS: Brain: No evidence of acute infarction,  hemorrhage, hydrocephalus, extra-axial collection or mass lesion/mass effect. There is ventricular sulcal enlargement reflecting mild to moderate atrophy, stable from the prior study. Patchy periventricular white matter hypoattenuation is also stable consistent with mild chronic microvascular ischemic change. Vascular: No hyperdense vessel or unexpected calcification. Skull: Normal. Negative for fracture or focal lesion. Sinuses/Orbits: Globes and orbits are unremarkable. Sinuses and mastoid air cells are clear. Other: None. IMPRESSION: 1. No acute intracranial  abnormalities. 2. Atrophy and mild chronic microvascular ischemic change, stable from the prior head CT. Electronically Signed   By: Lajean Manes M.D.   On: 05/23/2019 07:05   Ct Abdomen Pelvis W Contrast  Result Date: 05/23/2019 CLINICAL DATA:  Altered mental status. S/p recent laparotomy on 6/30. Pt also having generalized abd pain EXAM: CT ABDOMEN AND PELVIS WITH CONTRAST TECHNIQUE: Multidetector CT imaging of the abdomen and pelvis was performed using the standard protocol following bolus administration of intravenous contrast. CONTRAST:  148mL OMNIPAQUE IOHEXOL 300 MG/ML  SOLN COMPARISON:  Abdomen radiographs, 05/19/2019. Abdomen pelvis CT, 05/05/2019. FINDINGS: Lower chest: Minimal pleural effusions. Dependent lung base atelectasis. Hepatobiliary: Normal liver. Small dependent gallstones. No gallbladder wall thickening or adjacent inflammation. No bile duct dilation. Pancreas: Unremarkable. No pancreatic ductal dilatation or surrounding inflammatory changes. Spleen: Normal in size without focal abnormality. Adrenals/Urinary Tract: No adrenal masses. Bilateral renal cortical thinning. 8 mm low-density lesion protrudes from the cortex of the left kidney lower pole consistent with a cyst, similar to the prior CT. No other renal masses or lesions. No stones. No hydronephrosis. Normal ureters. Bladder is mostly decompressed by Foley catheter.  Stomach/Bowel: Normal stomach. There are dilated loops of small bowel, most prominent in the anterior upper abdomen, measuring 5.4 cm in diameter. No discrete transition point. Colon is normal in caliber. No bowel wall thickening. No adjacent inflammation. Vascular/Lymphatic: Aortic atherosclerosis. No enlarged abdominal or pelvic lymph nodes. Reproductive: Significant prosthetic enlargement. Prostate measures 7.4 x 7.7 x 7.0 cm. Other: Anterior abdominal wall midline incision.  No ascites. Musculoskeletal: Chronic bilateral pars defects at L5-S1 minor anterolisthesis. No acute fractures. Benign bone island in the right ilium, stable. No osteolytic lesions. IMPRESSION: 1. Dilated small bowel most evident in the anterior upper abdomen. There is no defined transition point. This likely reflects an adynamic ileus, but could reflect a partial small bowel obstruction. 2. No other acute abnormality within the abdomen or pelvis. 3. Minimal pleural effusions and associated lung base atelectasis. 4. Gallstones, but no acute cholecystitis. 5. Aortic atherosclerosis. Electronically Signed   By: Lajean Manes M.D.   On: 05/23/2019 07:15   Dg Chest Port 1 View  Result Date: 05/22/2019 CLINICAL DATA:  Fevers, history of recent surgery for small bowel obstruction EXAM: PORTABLE CHEST 1 VIEW COMPARISON:  05/12/2019 FINDINGS: Cardiac shadow is stable. Aortic calcifications are seen. Lungs are hypoinflated but clear. No acute abnormality noted. Right-sided PICC line has been removed in the interval. IMPRESSION: No acute abnormality noted. Electronically Signed   By: Inez Catalina M.D.   On: 05/22/2019 15:48      Kalman Drape , [facility] 05/23/2019, 9:12 AM  Pager: [phone] Mon-Wed, Friday 7:00am-4:30pm Thurs 7am-11:30am  Consults: [phone]

## 2019-05-23 NOTE — Progress Notes (Signed)
.  [facility] ADULT ICU REPLACEMENT PROTOCOL FOR AM LAB REPLACEMENT ONLY  The patient does apply for the [facility] Adult ICU Electrolyte Replacment Protocol based on the criteria listed below:   1. Is GFR >/= 40 ml/min? Yes.    Patient's GFR today is >60 2. Is urine output >/= 0.5 ml/kg/hr for the last 6 hours? Yes.   Patient's UOP is 0.83 ml/kg/hr 3. Is BUN < 60 mg/dL? Yes.    Patient's BUN today is 21 4. Abnormal electrolyte(s): K+ 3.3  5. Ordered repletion with: protocol 6. If a panic level lab has been reported, has the CCM MD in charge been notified? Yes.  .   Physician:  Dr. Willaim Bane, [place] Grumbling 05/23/2019 6:36 AM

## 2019-05-23 NOTE — Progress Notes (Signed)
PHARMACY - PHYSICIAN COMMUNICATION CRITICAL VALUE ALERT - BLOOD CULTURE IDENTIFICATION (BCID)  Russell Dillon is an 74 y.o. male who presented to [facility] on 05/22/2019 with a chief complaint of AMS  Assessment:  Started on broad spectrum Abx  Name of physician (or Provider) Contacted: left message for Dr Quindell Ivanoff  Current antibiotics: ampicillin 2gm q6  Changes to prescribed antibiotics recommended:  Change ampicillin to 2gm q4 Rounding team to address narrowing antibiotics  Results for orders placed or performed during the hospital encounter of 05/22/19  Blood Culture ID Panel (Reflexed) (Collected: 05/22/2019  1:35 PM)  Result Value Ref Range   Enterococcus species DETECTED (A) NOT DETECTED   Vancomycin resistance NOT DETECTED NOT DETECTED   Listeria monocytogenes NOT DETECTED NOT DETECTED   Staphylococcus species NOT DETECTED NOT DETECTED   Staphylococcus aureus (BCID) NOT DETECTED NOT DETECTED   Streptococcus species NOT DETECTED NOT DETECTED   Streptococcus agalactiae NOT DETECTED NOT DETECTED   Streptococcus pneumoniae NOT DETECTED NOT DETECTED   Streptococcus pyogenes NOT DETECTED NOT DETECTED   Acinetobacter baumannii NOT DETECTED NOT DETECTED   Enterobacteriaceae species NOT DETECTED NOT DETECTED   Enterobacter cloacae complex NOT DETECTED NOT DETECTED   Escherichia coli NOT DETECTED NOT DETECTED   Klebsiella oxytoca NOT DETECTED NOT DETECTED   Klebsiella pneumoniae NOT DETECTED NOT DETECTED   Proteus species NOT DETECTED NOT DETECTED   Serratia marcescens NOT DETECTED NOT DETECTED   Haemophilus influenzae NOT DETECTED NOT DETECTED   Neisseria meningitidis NOT DETECTED NOT DETECTED   Pseudomonas aeruginosa NOT DETECTED NOT DETECTED   Candida albicans NOT DETECTED NOT DETECTED   Candida glabrata NOT DETECTED NOT DETECTED   Candida krusei NOT DETECTED NOT DETECTED   Candida parapsilosis NOT DETECTED NOT DETECTED   Candida tropicalis NOT DETECTED NOT DETECTED     Beverlee Nims 05/23/2019  6:21 AM

## 2019-05-23 NOTE — Progress Notes (Signed)
Per Dr. Brantley Stage, pt may progress to the Puree/DYS 1 diet.

## 2019-05-23 NOTE — Plan of Care (Signed)
  Problem: Nutrition: Goal: Adequate nutrition will be maintained Outcome: Not Progressing   Problem: Skin Integrity: Goal: Risk for impaired skin integrity will decrease Outcome: Progressing   Problem: Safety: Goal: Ability to remain free from injury will improve Outcome: Progressing   Problem: Pain Managment: Goal: General experience of comfort will improve Outcome: Progressing   Problem: Elimination: Goal: Will not experience complications related to urinary retention Outcome: Progressing

## 2019-05-23 NOTE — Progress Notes (Signed)
SLP Cancellation Note  Patient Details Name: DAEVEON ZWEBER MRN: [id number] DOB: October 08, 1946   Cancelled treatment:       Reason Eval/Treat Not Completed: Fatigue/lethargy limiting ability to participate. Pt is not alert enough for assessment. May also have LP today. Will check back with RN in pm for potential readiness.   Herbie Baltimore, MA [place]  Acute Rehabilitation Services Pager [phone] Office [phone]  Lynann Beaver 05/23/2019, 9:57 AM

## 2019-05-23 NOTE — Evaluation (Addendum)
Clinical/Bedside Swallow Evaluation Patient Details  Name: Russell Dillon MRN: [id number] Date of Birth: 31-Jan-1946  Today's Date: 05/23/2019 Time: SLP Start Time (ACUTE ONLY): 1506 SLP Stop Time (ACUTE ONLY): 1515 SLP Time Calculation (min) (ACUTE ONLY): 9 min  Past Medical History:  Past Medical History:  Diagnosis Date  . Aortic valve regurgitation 10/16/2018   Echo 07/28/2017:  EF 60-65, moderate AI, aortic root and ascending aorta mildly dilated (40 mm), ascending aorta 43 mm, MAC, mild MR, mild LAE, moderate RV enlargement, trivial TR // Echo 12/19: EF 60-65, normal wall motion, mild AI, moderate BAE, ascending aorta 43 mm, aortic root 39 mm  . BPH (benign prostatic hypertrophy)   . Chronic anticoagulation   . Chronic atrial fibrillation   . Diastolic dysfunction October 2010   Normal LV systolic function  . Hx SBO   . Hyperlipidemia   . Hypertension   . Patellar tendon rupture, left, initial encounter 02/10/2019  . Small bowel obstruction [facility])    Past Surgical History:  Past Surgical History:  Procedure Laterality Date  . APPENDECTOMY  2004  . APPLICATION OF WOUND VAC Left 02/11/2019   Procedure: Application Of Wound Vac;  Surgeon: Altamese [place], MD;  Location: [place];  Service: Orthopedics;  Laterality: Left;  . CARDIOVASCULAR STRESS TEST  09/30/2007   EF 67%  No ischemia.   Marland Kitchen CARDIOVERSION  05/01/2004  . KNEE ARTHROSCOPY Left 02/11/2019   Procedure: ARTHROSCOPY LEFT KNEE;  Surgeon: Altamese [place], MD;  Location: [place];  Service: Orthopedics;  Laterality: Left;  . LAPAROTOMY N/A 05/11/2019   Procedure: EXPLORATORY LAPAROTOMY;  Surgeon: Rolm Bookbinder, MD;  Location: [place];  Service: General;  Laterality: N/A;  . LYSIS OF ADHESION N/A 05/11/2019   Procedure: LYSIS OF ADHESION;  Surgeon: Rolm Bookbinder, MD;  Location: [place];  Service: General;  Laterality: N/A;  . PATELLAR TENDON REPAIR Left 02/11/2019   Procedure: LEFT PATELLA TENDON REPAIR;  Surgeon: Altamese ,  MD;  Location: [place];  Service: Orthopedics;  Laterality: Left;  . SMALL INTESTINE SURGERY  2004  . US ECHOCARDIOGRAPHY  09/05/2009   ef 55-60%   HPI:  Russell Dillon is a 73 y.o. male with medical history significant of permanent atrial fibrillation, dementia, essential hypertension, HLD, diastolic congestive heart failure, aortic valve regurgitation, BPH, and recent small bowel obstruction with recent surgical intervention with lysis of adhesions who presents with chief complaint of progressive confusion and fever.  Patient with dementia, wife at bedside who gives majority of history of present illness.  Following recent hospitalization, was discharged on 05/20/2019 following small bowel obstruction.  Since returning home he has not had a bowel movement and very poor oral intake.  Last night, spouse reports progressive confusion with a fever of 102.3.  History of urinary tract infections in the past. Head CT 7/12 no acute findings; CXR 7/11 no acute findings   Assessment / Plan / Recommendation Clinical Impression  Pt presents with mild oral dysphagia which may be 2/2 AMS.  Pt confused and unable to participate in OME.  Pt alert, but intermittently agitated and resistant to SLP evaluation.  Pt expectorated 2 of 3 solid bolus trials.  There was prolonged mastication and poor oral clearance of trial which pt accepted. Pt benefitted from liquid was to help clear oral cavity.  There were no clinical s/s of aspiration with any consistencies trialed.  Recommend puree diet with thin liquid. SLP Visit Diagnosis: Dysphagia, unspecified (R13.10)    Aspiration Risk  Mild  aspiration risk    Diet Recommendation Dysphagia 1 (Puree);Thin liquid   Liquid Administration via: Cup;Straw Medication Administration: Crushed with puree Supervision: Full supervision/cueing for compensatory strategies Compensations: Minimize environmental distractions;Slow rate;Small sips/bites Postural Changes: Seated upright at  90 degrees    Other  Recommendations Oral Care Recommendations: Oral care BID   Follow up Recommendations        Frequency and Duration min 2x/week  2 weeks       Prognosis Prognosis for Safe Diet Advancement: Fair Barriers to Reach Goals: Cognitive deficits      Swallow Study   General HPI: Russell Dillon is a 73 y.o. male with medical history significant of permanent atrial fibrillation, dementia, essential hypertension, HLD, diastolic congestive heart failure, aortic valve regurgitation, BPH, and recent small bowel obstruction with recent surgical intervention with lysis of adhesions who presents with chief complaint of progressive confusion and fever.  Patient with dementia, wife at bedside who gives majority of history of present illness.  Following recent hospitalization, was discharged on 05/20/2019 following small bowel obstruction.  Since returning home he has not had a bowel movement and very poor oral intake.  Last night, spouse reports progressive confusion with a fever of 102.3.  History of urinary tract infections in the past. Head CT 7/12 no acute findings; CXR 7/11 no acute findings Type of Study: Bedside Swallow Evaluation Previous Swallow Assessment: none Diet Prior to this Study: NPO Temperature Spikes Noted: No Respiratory Status: Nasal cannula History of Recent Intubation: No Behavior/Cognition: Alert;Confused;Agitated Oral Cavity Assessment: (unable to assess) Oral Care Completed by SLP: No Oral Cavity - Dentition: (unable to assess) Self-Feeding Abilities: Total assist Patient Positioning: Upright in bed Baseline Vocal Quality: Normal Volitional Cough: Cognitively unable to elicit Volitional Swallow: Unable to elicit    Oral/Motor/Sensory Function Overall Oral Motor/Sensory Function: (Unable to assess)   Ice Chips Ice chips: Not tested   Thin Liquid Thin Liquid: Within functional limits Presentation: Cup;Straw    Nectar Thick Nectar Thick Liquid: Not  tested   Honey Thick Honey Thick Liquid: Not tested   Puree Puree: Within functional limits Presentation: Spoon   Solid     Solid: Impaired Presentation: Spoon Oral Phase Impairments: Impaired mastication Oral Phase Functional Implications: Prolonged oral transit;Impaired mastication Other Comments: [place], MA, [place] Office: [phone]; Pager [phone]): [phone] 05/23/2019,3:24 PM

## 2019-05-23 NOTE — Progress Notes (Signed)
Per Dr. Vaughan Browner, Goal HR is less than 120

## 2019-05-23 NOTE — Consult Note (Signed)
NAME:  Russell Dillon, MRN:  [id number], DOB:  1946/02/18, LOS: 1 ADMISSION DATE:  05/22/2019, CONSULTATION DATE:  05/23/2019 REFERRING MD:  Hospitalist CHIEF COMPLAINT:  AF   Brief History   73 yo M PMH chronic AF on AC, dementia A&Ox 1-2 at baseline, HTN, HLD, diastolic CHD, AVR, s/p ex lap for lysis of adhesions causing SBO (hospitalized at [facility] 6/24-05/20/2019) admitted 05/22/2019 for AMS and fever, T Max 104.67F.   History of present illness   73 yo M PMH chronic AF on AC, dementia A&Ox 1-2 at baseline, HTN, HLD, diastolic CHD, AVR, s/p ex lap for lysis of adhesions causing SBO (hospitalized at [facility] 6/24-05/20/2019) presented to ED 7/11 with AMS. Per wife he has become more confused from baseline dementia and A&Ox1-2 since discharge. He has taken medications as prescribed and she has been performing wound packing. No BM since discharge.   In the ED, temp 104.67F treated with anti pyretic, IVF, tachycardic-AF with RVR treated with diltiazem, UA concerning for UTI-broad spectrum antibiotics initiated, surgery consulted with plan for CT ABD/Pelvis. He was admitted to hospitalist service with plan for further w/u of meningitis d/t concern for nuchal rigidity on exam.   Around 2200 RRT called for hypotension, 68/43, pale, cool, clammy, HR variable. He received additional 1L IVF. BP stabilized however pt remained lethargic, confused, with HR 130s-160s.   PCCM consulted for further management.     Past Medical History   Aortic Valve regurgitation   Echo 07/28/2017:  EF 60-65, moderate AI, aortic root and ascending aorta mildly dilated (40 mm), ascending aorta 43 mm, MAC, mild MR, mild LAE, moderate RV enlargement, trivial TR // Echo 12/19: EF 60-65, normal wall motion, mild AI, moderate BAE, ascending aorta 43 mm, aortic root 39 mm  BPH (benign prostatic hypertrophy)   Chronic anticoagulation   Chronic atrial fibrillation   Diastolic dysfunction October 2010  Normal LV systolic function  Hx SBO    Hyperlipidemia   Hypertension   Patellar tendon rupture, left, initial encounter 02/10/2019  Small bowel obstruction ([place])   Dementia      Significant Hospital Events   RRT late 7/11  Transfer to ICU 7/12  Consults:  General Surgery PCCM   Procedures:  NA  Significant Diagnostic Tests:  NA  Micro Data:  COVID 19 7/11>>negative  RVP 7/11>> Blood 7/11>> Urine 7/11>> Antimicrobials:  Acyclovir 7/11>> Ampicillin 7/11>> Cefepime 7/11>> Metronidazole 7/11>> Vancomycin 7/11>>  Interim history/subjective:  Confused, agitated, BP labile with periods of hypotension.  Objective   Blood pressure 112/74, pulse (!) 162, temperature 99.3 F (37.4 C), temperature source Rectal, resp. rate (!) 22, SpO2 100 %.        Intake/Output Summary (Last 24 hours) at 05/23/2019 0029 Last data filed at 05/22/2019 2300 Gross per 24 hour  Intake 4700 ml  Output -  Net 4700 ml   There were no vitals filed for this visit.  Examination: General: well developed, elderly male, lying in bed, tremulous HENT: normocephalic, PERRL. Dry MM Lungs: BBS clear, FNL, symmetrical Cardiovascular: IRRR, unable to auscultate heart tones d/t agitation Abdomen: + BS x4. Soft. ND. midline surgical dressing CDI. Tenderness to palp R midline ABD. No guarding or rigidity.  Extremities: +2 distal pulses, tremulous, trace edema to hands Neuro: alert to person, agitated, intermittent word salad and word finding difficulty. Will not follow commands to assess for nuchal rigidity  GU: condom cath in place  Resolved Hospital Problem list   NA  Assessment & Plan:  Severe sepsis-s/p recent SBO with ex lap. Source possible UTI vs ABD vs meningeal Lactic acid normalized Procal elevated CXR no focal infiltrates Plan CT Head/ABD/pelvis pending May need additional IVF and pressor support F/U Cx data  Gen surgery following  Broad spectrum abx-de-escalate as appropriate  Follow WBC, fever curve  Metabolic  encephalopathy c/b dementia Plan CT head pending Treat infection May need precedex Unable to take chronic PO med for dementia for now   Chronic AF with RVR-on chronic diltiazem, unable to take PO and labile BP precludes IV dilt.  Plan Amiodarone bolus and infusion Optimize electrolytes Anti-coagulation on hold d/t possible procedure-Rresume as appropriate.    Anemia-appears stable Plan Monitor CBC    Best practice:  Diet: NPO Pain/Anxiety/Delirium protocol (if indicated): yes VAP protocol (if indicated): not indicated DVT prophylaxis: currently on hold d/t recent surgery and possible LP GI prophylaxis: famotidine Glucose control: SSI  Mobility: Bedrest  Code Status: FULL Family Communication: update wife, Wilian Kwong, by phone Disposition: Transfer to ICU  Labs   CBC: Recent Labs  Lab 05/16/19 0505 05/17/19 0358 05/20/19 0447 05/22/19 1340  WBC 9.0 8.8 8.7 15.6*  NEUTROABS  --  6.2  --  14.4*  HGB 10.9* 11.6* 9.2* 10.7*  HCT 35.6* 37.0* 29.0* 33.8*  MCV 84.0 83.9 84.8 84.7  PLT 220 186 223 829    Basic Metabolic Panel: Recent Labs  Lab 05/17/19 0358 05/18/19 0421 05/20/19 0447 05/22/19 1340  NA 135 135 136 136  K 5.4* 4.6 3.8 4.0  CL 106 106 105 106  CO2 21* 20* 21* 20*  GLUCOSE 132* 117* 138* 126*  BUN 36* 41* 44* 26*  CREATININE 0.88 0.82 0.75 1.01  CALCIUM 9.4 9.4 9.1 9.2  MG 2.0  --  1.7  --   PHOS 4.3  --  3.5  --    GFR: Estimated Creatinine Clearance: 70.4 mL/min (by C-G formula based on SCr of 1.01 mg/dL). Recent Labs  Lab 05/16/19 0505 05/17/19 0358 05/20/19 0447 05/22/19 1340 05/22/19 1609 05/22/19 2216  PROCALCITON  --   --   --   --   --  2.32  WBC 9.0 8.8 8.7 15.6*  --   --   LATICACIDVEN  --   --   --  1.6 2.0* 1.2    Liver Function Tests: Recent Labs  Lab 05/17/19 0358 05/20/19 0447 05/22/19 1340  AST 63* 20 18  ALT 121* 66* 43  ALKPHOS 100 116 118  BILITOT 1.0 0.6 1.4*  PROT 7.1 6.9 7.6  ALBUMIN 3.0* 2.9*  2.9*   No results for input(s): LIPASE, AMYLASE in the last 168 hours. No results for input(s): AMMONIA in the last 168 hours.  ABG    Component Value Date/Time   PHART 7.397 05/11/2019 1516   PCO2ART 39.0 05/11/2019 1516   PO2ART 144.0 (H) 05/11/2019 1516   HCO3 24.4 05/11/2019 1516   TCO2 26 05/11/2019 1516   ACIDBASEDEF 1.0 05/11/2019 1516   O2SAT 99.0 05/11/2019 1516     Coagulation Profile: Recent Labs  Lab 05/19/19 0247 05/20/19 0447 05/22/19 1340  INR 1.1 1.1 1.4*    Cardiac Enzymes: No results for input(s): CKTOTAL, CKMB, CKMBINDEX, TROPONINI in the last 168 hours.  HbA1C: Hgb A1c MFr Bld  Date/Time Value Ref Range Status  05/14/2019 04:01 AM 5.6 4.8 - 5.6 % Final    Comment:    (NOTE) Pre diabetes:          5.7%-6.4% Diabetes:              >  6.4% Glycemic control for   <7.0% adults with diabetes     CBG: Recent Labs  Lab 05/19/19 1831 05/20/19 0036 05/20/19 0545 05/20/19 1214 05/22/19 2240  GLUCAP 119* 132* 127* 103* 103*    Review of Systems:   Complete and negative except as noted in HPI.   Past Medical History  He,  has a past medical history of Aortic valve regurgitation (10/16/2018), BPH (benign prostatic hypertrophy), Chronic anticoagulation, Chronic atrial fibrillation, Diastolic dysfunction (October 2010), SBO, Hyperlipidemia, Hypertension, Patellar tendon rupture, left, initial encounter (02/10/2019), and Small bowel obstruction ([place]).   Surgical History    Past Surgical History:  Procedure Laterality Date  . APPENDECTOMY  2004  . APPLICATION OF WOUND VAC Left 02/11/2019   Procedure: Application Of Wound Vac;  Surgeon: Altamese Cohoes, MD;  Location: [place];  Service: Orthopedics;  Laterality: Left;  . CARDIOVASCULAR STRESS TEST  09/30/2007   EF 67%  No ischemia.   Marland Kitchen CARDIOVERSION  05/01/2004  . KNEE ARTHROSCOPY Left 02/11/2019   Procedure: ARTHROSCOPY LEFT KNEE;  Surgeon: Altamese Hays, MD;  Location: [place];  Service: Orthopedics;   Laterality: Left;  . LAPAROTOMY N/A 05/11/2019   Procedure: EXPLORATORY LAPAROTOMY;  Surgeon: Rolm Bookbinder, MD;  Location: [place];  Service: General;  Laterality: N/A;  . LYSIS OF ADHESION N/A 05/11/2019   Procedure: LYSIS OF ADHESION;  Surgeon: Rolm Bookbinder, MD;  Location: [place];  Service: General;  Laterality: N/A;  . PATELLAR TENDON REPAIR Left 02/11/2019   Procedure: LEFT PATELLA TENDON REPAIR;  Surgeon: Altamese [place], MD;  Location: [place];  Service: Orthopedics;  Laterality: Left;  . SMALL INTESTINE SURGERY  2004  . US ECHOCARDIOGRAPHY  09/05/2009   ef 55-60%     Social History   reports that he has never smoked. He has never used smokeless tobacco. He reports that he does not drink alcohol or use drugs.   Family History   His family history includes Diabetes in his father; Heart attack in his mother; Heart disease in his father; Hypertension in his mother. There is no history of Colon cancer, Esophageal cancer, Pancreatic cancer, Prostate cancer, Rectal cancer, or Stomach cancer.   Allergies No Known Allergies   Home Medications  Prior to Admission medications   Medication Sig Start Date End Date Taking? Authorizing Provider  acetaminophen (TYLENOL) 325 MG tablet Take 2 tablets (650 mg total) by mouth every 6 (six) hours as needed for mild pain, fever or headache. 03/02/19  Yes Angiulli, Lavon Paganini, PA-C  allopurinol (ZYLOPRIM) 100 MG tablet Take 1 tablet (100 mg total) by mouth daily. Patient taking differently: Take 100 mg by mouth at bedtime.  03/26/19  Yes Black, Lezlie Octave, NP  Cholecalciferol (VITAMIN D-3) 25 MCG (1000 UT) CAPS Take 1,000 Units by mouth 2 (two) times a day.   Yes [provider]  colchicine 0.6 MG tablet Take 1 tablet (0.6 mg total) by mouth 2 (two) times daily. Patient taking differently: Take 0.6 mg by mouth daily.  03/25/19  Yes Dhungel, Nishant, MD  cyanocobalamin (,VITAMIN B-12,) 1000 MCG/ML injection Inject 1,000 mcg into the muscle every 30  (thirty) days.   Yes [provider]  diltiazem (CARDIZEM CD) 120 MG 24 hr capsule Take 1 capsule (120 mg total) by mouth daily. 03/25/19 03/24/20 Yes Black, Lezlie Octave, NP  docusate sodium (COLACE) 100 MG capsule Take 1 capsule (100 mg total) by mouth 2 (two) times daily. Patient taking differently: Take 100 mg by mouth at bedtime.  05/20/19  Yes Adhikari, Tamsen Meek, MD  donepezil (ARICEPT) 10 MG tablet Take 1 tablet (10 mg total) by mouth daily. Patient taking differently: Take 10 mg by mouth at bedtime.  03/02/19  Yes Angiulli, Lavon Paganini, PA-C  feeding supplement, ENSURE ENLIVE, (ENSURE ENLIVE) LIQD Take 237 mLs by mouth 2 (two) times daily between meals. Patient taking differently: Take 118.5-237 mLs by mouth 2 (two) times daily as needed (for supplementation). CHOCOLATE 03/22/19  Yes Regalado, Belkys A, MD  finasteride (PROSCAR) 5 MG tablet Take 1 tablet (5 mg total) by mouth every morning. 03/02/19  Yes Angiulli, Lavon Paganini, PA-C  lithium carbonate 150 MG capsule TAKE 1 CAPSULE(150 MG) BY MOUTH AT BEDTIME Patient taking differently: Take 150 mg by mouth at bedtime.  04/19/19  Yes Cottle, Billey Co., MD  Multiple Vitamins-Minerals (ICAPS AREDS 2 PO) Take 1 capsule by mouth daily.    Yes [provider]  omega-3 fish oil (MAXEPA) 1000 MG CAPS capsule Take 1,000 mg by mouth daily with breakfast.    Yes [provider]  polyethylene glycol powder (GLYCOLAX/MIRALAX) 17 GM/SCOOP powder Take 17 g by mouth daily. 05/20/19 06/19/19 Yes Shelly Coss, MD  warfarin (COUMADIN) 2.5 MG tablet Take 1 tablet (2.5 mg total) by mouth daily. Patient taking differently: Take 2.5 mg by mouth daily at 6 PM.  03/02/19  Yes Angiulli, Lavon Paganini, PA-C  HYDROcodone-acetaminophen (NORCO/VICODIN) 5-325 MG tablet Take 1-2 tablets by mouth every 4 (four) hours as needed for moderate pain (pain score 4-6). Patient not taking: Reported on 05/05/2019 03/02/19   Cathlyn Parsons, PA-C     Critical care time: I spent 45  minutes in direct patient care including reviewing data,  discussing with other providers, assessment, planning and stabilization and documentation. Time is exclusive to this patient and does not include procedures.

## 2019-05-23 NOTE — Progress Notes (Signed)
Received pt from 4E, pt with atrial fib HR 130s sbp 90s, amiodarone infusing 60mg /hr. Pt confused agitated consolable, will start precedex drip per NP order.

## 2019-05-23 NOTE — Progress Notes (Signed)
NAME:  Russell Dillon, MRN:  [id number], DOB:  02/24/1946, LOS: 1 ADMISSION DATE:  05/22/2019, CONSULTATION DATE:  05/23/2019 REFERRING MD:  Hospitalist CHIEF COMPLAINT:  AF   Brief History   73 yo M PMH chronic AF on AC, dementia A&Ox 1-2 at baseline, HTN, HLD, diastolic CHD, AVR, s/p ex lap for lysis of adhesions causing SBO (hospitalized at [facility] 6/24-05/20/2019) admitted 05/22/2019 for AMS and fever, T Max 104.38F.   Around 2200 RRT called for hypotension, 68/43, pale, cool, clammy, HR variable. He received additional 1L IVF. BP stabilized however pt remained lethargic, confused, with HR 130s-160s.   PCCM consulted for further management.     Past Medical History   Aortic Valve regurgitation   Echo 07/28/2017:  EF 60-65, moderate AI, aortic root and ascending aorta mildly dilated (40 mm), ascending aorta 43 mm, MAC, mild MR, mild LAE, moderate RV enlargement, trivial TR // Echo 12/19: EF 60-65, normal wall motion, mild AI, moderate BAE, ascending aorta 43 mm, aortic root 39 mm  BPH (benign prostatic hypertrophy)   Chronic anticoagulation   Chronic atrial fibrillation   Diastolic dysfunction October 2010  Normal LV systolic function  Hx SBO   Hyperlipidemia   Hypertension   Patellar tendon rupture, left, initial encounter 02/10/2019  Small bowel obstruction ([place])   Dementia      Significant Hospital Events   RRT late 7/11  Transfer to ICU 7/12: Required vasoactive drips, placed on Precedex.  Cultures from blood showing enterococcus antibiotics narrowed  Consults:  General Surgery PCCM   Procedures:  NA  Significant Diagnostic Tests:  NA  Micro Data:  COVID 19 7/11>>negative  RVP 7/11>> negative Blood 7/11>> GPC/enterococcus>>> Urine 7/11>> Antimicrobials:  Acyclovir 7/11>> 7/12 Ampicillin 7/11>> 7/12 Cefepime 7/11>> 7/12 Metronidazole 7/11>> 7/12 Vancomycin 7/11>> 7/12 Unasyn 7/12>>>  Interim history/subjective:  Currently sedated  Objective   Blood  pressure 109/70, pulse 91, temperature 98.4 F (36.9 C), temperature source Oral, resp. rate 20, weight 81.7 kg, SpO2 100 %.        Intake/Output Summary (Last 24 hours) at 05/23/2019 1884 Last data filed at 05/23/2019 0800 Gross per 24 hour  Intake 6008.1 ml  Output 950 ml  Net 5058.1 ml   Filed Weights   05/23/19 0245  Weight: 81.7 kg    Examination:  General this is a 73 year old demented male patient he appears chronically ill and is currently sedated HEENT temporal wasting mucous membranes dry no jugular venous distention Pulmonary: Clear to auscultation no accessory use Cardiac: Currently atrial fibrillation with controlled ventricular response no murmur rub or gallop appreciated Abdomen: Mid abdominal dressing intact abdomen soft positive bowel sounds no pain to palpation GU: Concentrated yellow urine Neuro: Currently sedated but will localize with painful stimulation Extremities: Warm and dry generalized anasarca  Resolved Hospital Problem list   NA  Assessment & Plan:   Severe sepsis/septic shock with GPC bacteremia/prelim enterococcus- Source possible UTI vs ABD I am less likely to believe this is meningeal, would favor urinary tract -Reflexed blood culture panel showing enterococcus.  Had recent -CT abdomen with dilated small bowel without defined transition point.  Felt to reflect adynamic ileus but also consider partial small bowel obstruction.  No other acute findings within the abdomen and pelvis. Plan Discontinue LP Narrow antibiotics to mono coverage with Unasyn Weaning Neo-Synephrine, suspect pressor requirements were partially related to rapid ventricular response from atrial fibrillation Continue maintenance IV fluids Await sensitivities  Mild relative adrenal insufficiency Serum cortisol 17.8, would expect  this to be higher in the setting of acute critical illness and sepsis however we are weaning vasoactive drips Plan We will hold off from cortisol  replacement  Acute metabolic encephalopathy c/b dementia, suspect secondary to sepsis. -Had been acutely agitated now sedated on Precedex infusion. -CT of brain was negative for acute infarct, hemorrhage, hydrocephalus or other findings to explain current mental status change Plan We have discontinued Precedex due to sedation, may need to resume this at lower dosing depending on clinical response Continue supportive care I think we can forego lumbar puncture   Fluid and electrolyte imbalance: Hypokalemia, Non-anion gap metabolic acidosis, this is actually gotten worse.  Hypomagnesemia Plan Replace potassium and magnesium Continue LR Repeat chemistry later this a.m., if acidosis continues with further bicarbonate loss favor switching LR to sodium bicarbonate  Chronic AF with RVR-on chronic diltiazem, unable to take PO and labile BP precluded IV dilt.  -As of a.m. rounds 7/12 now with controlled ventricular rate, also suspect Precedex infusion has assisted with better rate control Plan Ensure normal potassium and magnesium Continue telemetry monitoring Continuing amiodarone for now Warfarin has been on hold, will hold this another 24 hours to ensure no procedures are needed.  If continues to stabilize we will initiate IV heparin on 7/13  Anemia-appears stable, slight drop as expected with volume resuscitation efforts no evidence of bleeding Plan Continue to trend CBC   Best practice:  Diet: NPO Pain/Anxiety/Delirium protocol (if indicated): yes VAP protocol (if indicated): not indicated DVT prophylaxis: currently on hold d/t recent surgery and possible LP GI prophylaxis: famotidine Glucose control: SSI  Mobility: Bedrest  Code Status: FULL Family Communication: update wife, Russell Dillon, by phone Disposition: Continue aggressive care, narrow antibiotics, close observation of electrolytes   Critical care time: 55 minutes

## 2019-05-23 NOTE — Progress Notes (Addendum)
eLink Physician-Brief Progress Note Patient Name: Russell Dillon DOB: 08/06/1946 MRN: [id number]   Date of Service  05/23/2019  HPI/Events of Note  72/M with hx of chronic atrial fibrillation, s/p EL for lysis of adhesions, discharged on 05/20/2019, returning on 05/22/19 for fever and altered mental status.    The patient was febrile on arrival and in rapid atrial fibrillation.  Rapid response was called earlier for hypotension.  BP improved afte 1L IVF bolus.  PCCM ws consulted for rapid atrial fibrillation and patient has been transferred to the ICU.    eICU Interventions  Continue broad spectrum antibiotics.  Pt needs CT abdomen.  Pt started on amiodarone.  Continue empiric antibiotics.  Start on neosynephrine if he becomes hypotensive.       Intervention Category Evaluation Type: New Patient Evaluation  Elsie Lincoln 05/23/2019, 2:20 AM   4:23 AM Notified of lactate trending up from 1.2 --> 2.3  Plan> Continue to monitor.  Pt is on IVFs, and was fluid resuscitated.  Continue neosynephrine.  6:19 AM Notified of blood culture with enterococcus.   Plan> Continue antibiotics pending final cultures. Follow up CT abdomen.

## 2019-05-23 NOTE — Progress Notes (Signed)
Pt transferred from ED to 4E, responded to voice, disoriented x 3, only oriented to himself. CHG bath given, CCMD called with 2nd person verified. EKG's Atrial fib with RVR, low BP 80/51-98/59 mmHg. Hemodynamics unstable due to sepsis and unable to give Cardizem IV from ED RN reported.  I notified on call provider Dr.X. Blount, order received for 500 ml NSS bolus x 2. Including IV fluid bolus from ED total PT got 4,000 ml. Called RRT, David,RN at bedside for support. Critical care team via E- link notified.  MD from critical care team at bed side for evaluation.HR 150-160 on monitor. Order received for Amiodarone 150 mg bolus and then continue IV drip. BP drop to 80/13mmHg.  MD decided to move Pt to 3M05. Pt transferred at 01: 30am.  Kennyth Lose, RN

## 2019-05-23 NOTE — Progress Notes (Signed)
RN spoke with patients wife and wife asked about visitation. She was able to visit pt on previous floors. Pt DOES have an order for visitation given his dementia to prevent ICU delirium. RN spoke with [facility] Ron who agreed that if appropriate, wife can visit. Pt has been sleeping most of day, but in the past 30 minutes is becoming increasingly verbally aggressive. Decision was made to allow wife to visit for 1-2 hours maximum to help decrease aggressive behavior. Wife aware and will be up to hospital shortly. Directed to ask for Ron at front desk when she arrives and told pt is in [phone]

## 2019-05-24 ENCOUNTER — Encounter (HOSPITAL_COMMUNITY): Payer: Self-pay | Admitting: Internal Medicine

## 2019-05-24 DIAGNOSIS — Z8719 Personal history of other diseases of the digestive system: Secondary | ICD-10-CM

## 2019-05-24 DIAGNOSIS — M25562 Pain in left knee: Secondary | ICD-10-CM

## 2019-05-24 DIAGNOSIS — I5032 Chronic diastolic (congestive) heart failure: Secondary | ICD-10-CM

## 2019-05-24 DIAGNOSIS — I4819 Other persistent atrial fibrillation: Secondary | ICD-10-CM

## 2019-05-24 DIAGNOSIS — I351 Nonrheumatic aortic (valve) insufficiency: Secondary | ICD-10-CM

## 2019-05-24 DIAGNOSIS — N390 Urinary tract infection, site not specified: Secondary | ICD-10-CM

## 2019-05-24 DIAGNOSIS — F0390 Unspecified dementia without behavioral disturbance: Secondary | ICD-10-CM

## 2019-05-24 DIAGNOSIS — B952 Enterococcus as the cause of diseases classified elsewhere: Secondary | ICD-10-CM | POA: Diagnosis present

## 2019-05-24 DIAGNOSIS — R8281 Pyuria: Secondary | ICD-10-CM

## 2019-05-24 DIAGNOSIS — E785 Hyperlipidemia, unspecified: Secondary | ICD-10-CM

## 2019-05-24 DIAGNOSIS — N400 Enlarged prostate without lower urinary tract symptoms: Secondary | ICD-10-CM

## 2019-05-24 DIAGNOSIS — R7881 Bacteremia: Secondary | ICD-10-CM | POA: Diagnosis present

## 2019-05-24 DIAGNOSIS — I110 Hypertensive heart disease with heart failure: Secondary | ICD-10-CM

## 2019-05-24 LAB — CBC
HCT: 24.8 % — ABNORMAL LOW (ref 39.0–52.0)
Hemoglobin: 7.8 g/dL — ABNORMAL LOW (ref 13.0–17.0)
MCH: 26.5 pg (ref 26.0–34.0)
MCHC: 31.5 g/dL (ref 30.0–36.0)
MCV: 84.4 fL (ref 80.0–100.0)
Platelets: 216 10*3/uL (ref 150–400)
RBC: 2.94 MIL/uL — ABNORMAL LOW (ref 4.22–5.81)
RDW: 21.2 % — ABNORMAL HIGH (ref 11.5–15.5)
WBC: 8.8 10*3/uL (ref 4.0–10.5)
nRBC: 0 % (ref 0.0–0.2)

## 2019-05-24 LAB — CBC WITH DIFFERENTIAL/PLATELET
Abs Immature Granulocytes: 0 10*3/uL (ref 0.00–0.07)
Basophils Absolute: 0.1 10*3/uL (ref 0.0–0.1)
Basophils Relative: 1 %
Eosinophils Absolute: 0.1 10*3/uL (ref 0.0–0.5)
Eosinophils Relative: 1 %
HCT: 24.7 % — ABNORMAL LOW (ref 39.0–52.0)
Hemoglobin: 7.7 g/dL — ABNORMAL LOW (ref 13.0–17.0)
Lymphocytes Relative: 7 %
Lymphs Abs: 0.7 10*3/uL (ref 0.7–4.0)
MCH: 26.2 pg (ref 26.0–34.0)
MCHC: 31.2 g/dL (ref 30.0–36.0)
MCV: 84 fL (ref 80.0–100.0)
Monocytes Absolute: 0.2 10*3/uL (ref 0.1–1.0)
Monocytes Relative: 2 %
Neutro Abs: 9.1 10*3/uL — ABNORMAL HIGH (ref 1.7–7.7)
Neutrophils Relative %: 89 %
Platelets: 221 10*3/uL (ref 150–400)
RBC: 2.94 MIL/uL — ABNORMAL LOW (ref 4.22–5.81)
RDW: 21.2 % — ABNORMAL HIGH (ref 11.5–15.5)
WBC: 10.2 10*3/uL (ref 4.0–10.5)
nRBC: 0 % (ref 0.0–0.2)
nRBC: 0 /100 WBC

## 2019-05-24 LAB — BASIC METABOLIC PANEL
Anion gap: 9 (ref 5–15)
BUN: 16 mg/dL (ref 8–23)
CO2: 16 mmol/L — ABNORMAL LOW (ref 22–32)
Calcium: 8.2 mg/dL — ABNORMAL LOW (ref 8.9–10.3)
Chloride: 110 mmol/L (ref 98–111)
Creatinine, Ser: 0.85 mg/dL (ref 0.61–1.24)
GFR calc Af Amer: >60 mL/min (ref >60)
GFR calc non Af Amer: >60 mL/min (ref >60)
Glucose, Bld: 95 mg/dL (ref 70–99)
Potassium: 3.4 mmol/L — ABNORMAL LOW (ref 3.5–5.1)
Sodium: 135 mmol/L (ref 135–145)

## 2019-05-24 LAB — URINE CULTURE: Culture: >=100000 — AB

## 2019-05-24 LAB — PROTIME-INR
INR: 2 — ABNORMAL HIGH (ref 0.8–1.2)
Prothrombin Time: 22.4 seconds — ABNORMAL HIGH (ref 11.4–15.2)

## 2019-05-24 LAB — PROCALCITONIN: Procalcitonin: 2.44 ng/mL

## 2019-05-24 LAB — GLUCOSE, CAPILLARY: Glucose-Capillary: 111 mg/dL — ABNORMAL HIGH (ref 70–99)

## 2019-05-24 MED ORDER — WARFARIN SODIUM 2.5 MG PO TABS
2.5000 mg | ORAL_TABLET | Freq: Every day | ORAL | Status: AC
  Administered 2019-05-24: 2.5 mg via ORAL
  Filled 2019-05-24 (×2): qty 1

## 2019-05-24 MED ORDER — DILTIAZEM HCL 60 MG PO TABS
60.0000 mg | ORAL_TABLET | Freq: Two times a day (BID) | ORAL | Status: DC
  Administered 2019-05-24: 60 mg via ORAL
  Filled 2019-05-24: qty 1

## 2019-05-24 MED ORDER — POLYETHYLENE GLYCOL 3350 17 G PO PACK
17.0000 g | PACK | Freq: Every day | ORAL | Status: DC
  Administered 2019-05-24 – 2019-05-29 (×6): 17 g via ORAL
  Filled 2019-05-24 (×6): qty 1

## 2019-05-24 MED ORDER — BISACODYL 10 MG RE SUPP
10.0000 mg | Freq: Once | RECTAL | Status: AC
  Administered 2019-05-24: 10 mg via RECTAL

## 2019-05-24 MED ORDER — WARFARIN - PHARMACIST DOSING INPATIENT
Freq: Every day | Status: DC
  Administered 2019-05-24: 18:00:00

## 2019-05-24 MED ORDER — DILTIAZEM HCL ER COATED BEADS 120 MG PO CP24
120.0000 mg | ORAL_CAPSULE | Freq: Every day | ORAL | Status: DC
  Filled 2019-05-24: qty 1

## 2019-05-24 MED ORDER — DILTIAZEM HCL 60 MG PO TABS
30.0000 mg | ORAL_TABLET | Freq: Four times a day (QID) | ORAL | Status: DC
  Administered 2019-05-24 – 2019-05-29 (×19): 30 mg via ORAL
  Filled 2019-05-24 (×19): qty 1

## 2019-05-24 MED ORDER — LACTATED RINGERS IV BOLUS: 500.0000 mL | Freq: Once | INTRAVENOUS | Status: DC

## 2019-05-24 MED ORDER — FAMOTIDINE 20 MG PO TABS
20.0000 mg | ORAL_TABLET | Freq: Two times a day (BID) | ORAL | Status: DC
  Administered 2019-05-25 – 2019-05-29 (×9): 20 mg via ORAL
  Filled 2019-05-24 (×10): qty 1

## 2019-05-24 MED ORDER — POTASSIUM CHLORIDE CRYS ER 20 MEQ PO TBCR
40.0000 meq | EXTENDED_RELEASE_TABLET | Freq: Once | ORAL | Status: DC
  Filled 2019-05-24 (×2): qty 2

## 2019-05-24 NOTE — Progress Notes (Signed)
ANTICOAGULATION CONSULT NOTE - Initial Consult  Pharmacy Consult for Warfarin Indication: atrial fibrillation  No Known Allergies  Patient Measurements: Weight: 180 lb 1.9 oz (81.7 kg)   Vital Signs: Temp: 97.4 F (36.3 C) (07/13 0748) Temp Source: Oral (07/13 0748) BP: 104/70 (07/13 0800) Pulse Rate: 110 (07/13 0800)  Labs: Recent Labs    05/22/19 1340 05/23/19 0145 05/23/19 0525 05/23/19 1346 05/24/19 0259 05/24/19 0648  HGB 10.7* 9.3*  --   --  7.7* 7.8*  HCT 33.8* 29.1*  --   --  24.7* 24.8*  PLT 310 234  --   --  221 216  APTT 45*  --   --   --   --   --   LABPROT 16.9* 20.9*  --   --  22.4*  --   INR 1.4* 1.8*  --   --  2.0*  --   CREATININE 1.01 1.00 0.90 0.87 0.85  --     Estimated Creatinine Clearance: 83.7 mL/min (by C-G formula based on SCr of 0.85 mg/dL).   Medical History: Past Medical History:  Diagnosis Date  . Aortic valve regurgitation 10/16/2018   Echo 07/28/2017:  EF 60-65, moderate AI, aortic root and ascending aorta mildly dilated (40 mm), ascending aorta 43 mm, MAC, mild MR, mild LAE, moderate RV enlargement, trivial TR // Echo 12/19: EF 60-65, normal wall motion, mild AI, moderate BAE, ascending aorta 43 mm, aortic root 39 mm  . BPH (benign prostatic hypertrophy)   . Chronic anticoagulation   . Chronic atrial fibrillation   . Diastolic dysfunction October 2010   Normal LV systolic function  . Hx SBO   . Hyperlipidemia   . Hypertension   . Patellar tendon rupture, left, initial encounter 02/10/2019  . Small bowel obstruction ([facility])     Medications:  Scheduled:  . Chlorhexidine Gluconate Cloth  6 each Topical Daily  . colchicine  0.6 mg Oral BID  . diltiazem  120 mg Oral Daily  . docusate sodium  100 mg Oral BID  . donepezil  10 mg Oral QHS  . finasteride  5 mg Oral q morning - 10a  . lithium carbonate  150 mg Oral QHS  . mouth rinse  15 mL Mouth Rinse BID  . potassium chloride  40 mEq Oral Once  . sodium chloride flush  3 mL  Intravenous Once  . warfarin  2.5 mg Oral q1800  . Warfarin - Pharmacist Dosing Inpatient   Does not apply q1800    Assessment: Patient was on warfarin 2.35m daily PTA for atrial fibrillation. Restarting warfarin today INR 2.0 so will not need to bridge with IV heparin. Pt is on amiodarone currently because he has been refusing to take medications by mouth, restarted Cardizem today and the amiodarone will be stopped once HR stabilized.  Goal of Therapy:  INR 2-3    Plan:  Warfarin 2.564mx1 then daily pro-times  TyPhillis Haggis/13/2020,8:47 AM

## 2019-05-24 NOTE — Progress Notes (Signed)
During shift assessment and medication administration patient confused (disoriented to time, situation and place). Patient became increasingly agitated and irritable with assessment. Not able to complete complete physical assessment as patient became irritated with questions and physical touch. Patient pulling hands away and swatting RN hands away during assessment. Patient refused medications and requested to be left alone. Observed smeared stool on linen and smell of stool. Inquired if patient had soiled himself, patient stated he had not soiled himself. Paged NP Schorr at 2130 to inform patient confused,irritable/agitated & refusing oral meds.Need to perform pt. care, however, too irritable to perform.(patient not [facility])  Inquired about taking meds upon reassessment at 2158, since patient more calm. Patient stated no and that he had enough.   Rounded on patient at 2345, patient alert and awake. Inquired if patient was ok, patient stated he was ok. Asked if patient wanted assistance getting him tucked in for bed to help sleep. Patient stated he was ok. Inquired if patient wanted and bath and/or clean bottom before bed. Patient stated "no."  Paged CC [phone] at 2357 to inform of patient refusal of oral meds. Need to administer Cardizem. Spoke with MD [place], informed of patient's irritability with increased interaction although calm. Informed MD patient refused all oral meds and has declined patient care although soiled. Informed HR 93-106 and will check BP and enter into chart for viewing to order IV meds if needed.  Patient allowed VS check at 0000 and basic reassessment, however, became irritable when attempted to assess abdominal dressing. Patient requested to be left alone and began pulling gown down.   Paged CC at 0053. Informed MD [place] of BP; will order IV metoprolol. RN inquired if patient taking med at night besides Aricept and Lithium for irritability and sleep as he is lying in bed  awake and need to perform patient care although he refuses. Awaiting further assessment of patient chart and orders by MD. Received follow up call for [place] stating will order IV Benadryl.   Rounded on patient at 0245, patient sitting in bed alert/awake. Inquired if patient felt sleepy, he stated "no" and that he felt normal. Patient stated he was aware that RN had something that she needed to do. Informed patient will need to reposition him in bed and change bed linen. Patient stated "I dont know about all of that" and referenced the "pins" in his arm referring to IV.  Patient more calm after performing patient care. Patient stated "thank you" to staff for helping him.

## 2019-05-24 NOTE — Consult Note (Signed)
Russell Dillon for Infectious Disease    Date of Admission:  05/22/2019   Total days of antibiotics 3        Day 2 Unasyn               Reason for Consult: Enterococcus Bacteremia    Referring Physician: Marshell Garfinkel, MD  Primary Care Physician: Marton Redwood, MD  Active Problems:   Bacteremia due to Enterococcus   Persistent atrial fibrillation with RVR   Chronic diastolic CHF (congestive heart failure) ([place])   . Chlorhexidine Gluconate Cloth  6 each Topical Daily  . colchicine  0.6 mg Oral BID  . diltiazem  120 mg Oral Daily  . docusate sodium  100 mg Oral BID  . donepezil  10 mg Oral QHS  . finasteride  5 mg Oral q morning - 10a  . lithium carbonate  150 mg Oral QHS  . mouth rinse  15 mL Mouth Rinse BID  . potassium chloride  40 mEq Oral Once  . sodium chloride flush  3 mL Intravenous Once  . warfarin  2.5 mg Oral q1800  . Warfarin - Pharmacist Dosing Inpatient   Does not apply q1800   Recommendations: 1. Enterococcus Bacteremia due to urinary source. Blood culture: Gram positive cocci in 2 bottles. BCID: enterococcus species. Urine culture: pan-sensitive Enterococcus faecalis. Wbc 16.9->8.8 BP this am 104/70 - Repeat Blood Culture  - Echocardiogram once rate is better controlled - Can consider narrowing down to ampicillin when blood culture sensitivities result  2.  Persistent Atrial Fibrillation w/ RVR: Currently on cardizem and amiodarone drip.       - Would get better echo if rate better controlled       - Management per primary  Assessment: Russell Dillon is a 73 yo M w/ PMH of persistent a.fib, dementia (baseline AAOx2), HTN, HLD, HFpEF, BPH and recent hospitalization for SBO admit w/ Enterococcus bacteremia. He has defervesced quickly after 2 days of antibiotics and currently his blood pressure has improved and leukocytosis has resolved. Urine culture has been positive for enterococcus faecalis which is consistent with his blood culture finding.  He has a history of moderate aortic valve regurgitation on his prior echocardiogram as well as 2x positive blood cultures which puts him at risk for endocarditis with DENOVA score >6. He will need further work-up to rule out endocarditis.   HPI: Russell Dillon is a 73 y.o. male w/ PMH of persistent a.fib, dementia (baseline AAOx2), HTN, HLD, HFpEF, BPH and recent hospitalization for SBO who presented to [facility] w/ confusion and fever. He was evaluated at bedside this AM. He was observed resting comfortably in bed. He was AAOx2 to name and location but unable to describe year. He states he does not recall the reason for his admission but states 'Russell can tell you more' Denies any fevers, chills, nausea, vomiting or abdominal pain. He states his left knee is in pain but no other complaints at this time. Bedside RN confirms he was afebrile overnight.  On chart review, He was discharged on 05/20/2019 after hospitalization for small bowel obstruction treated by surgical lysis of adhesions. On night of 05/21/2019, his wife noted that he was having poor oral intake with progressive confusion and fever of 102.84F. He was brought to [facility] for evaluation. In the ED, he was noted to be hypotensive, febrile with leukocytosis and was started on broad spectrum antibiotics (cefepime, vancomycin, metronidazole) and was admitted. He was transferred  to ICU on the same day for pressor support. BCID was positive for enterococcus faecalis and antibiotics was narrowed to Unasyn. ID was consulted.  Spoke with Ms.Russell Dillon via phone who states that at baseline recognizes family and is alert and oriented x2 to self and place but unable to keep up with current events. She mentions that prior to hospitalization he did not have any specific complaints but notes that on his prior episode of UTIs, he would often become more confused and lethargic. She mentions that he has had knee surgery for tendon repair earlier this year and has  been having left knee tenderness since the surgery.   Review of Systems: Review of Systems  Constitutional: Negative for chills and fever.  Respiratory: Negative for shortness of breath.   Cardiovascular: Negative for chest pain and palpitations.  Gastrointestinal: Negative for abdominal pain, nausea and vomiting.  Genitourinary: Negative for frequency and urgency.  Musculoskeletal: Positive for joint pain. Negative for back pain and neck pain.   Past Medical History:  Diagnosis Date  . Acute metabolic encephalopathy 8/67/6195  . Aortic valve regurgitation 10/16/2018   Echo 07/28/2017:  EF 60-65, moderate AI, aortic root and ascending aorta mildly dilated (40 mm), ascending aorta 43 mm, MAC, mild MR, mild LAE, moderate RV enlargement, trivial TR // Echo 12/19: EF 60-65, normal wall motion, mild AI, moderate BAE, ascending aorta 43 mm, aortic root 39 mm  . BPH (benign prostatic hyperplasia)   . BPH (benign prostatic hypertrophy)   . Chronic anticoagulation   . Chronic atrial fibrillation   . Diastolic dysfunction October 2010   Normal LV systolic function  . Hx SBO   . Hyperlipidemia   . Hypertension   . Patellar tendon rupture, left, initial encounter 02/10/2019  . Severe sepsis ([place]) 05/05/2019  . Small bowel obstruction ([facility])    Social History   Tobacco Use  . Smoking status: Never Smoker  . Smokeless tobacco: Never Used  Substance Use Topics  . Alcohol use: No    Alcohol/week: 0.0 standard drinks  . Drug use: No   Family History  Problem Relation Age of Onset  . Heart attack Mother   . Hypertension Mother   . Diabetes Father   . Heart disease Father   . Colon cancer Neg Hx   . Esophageal cancer Neg Hx   . Pancreatic cancer Neg Hx   . Prostate cancer Neg Hx   . Rectal cancer Neg Hx   . Stomach cancer Neg Hx    No Known Allergies  OBJECTIVE: Blood pressure 104/70, pulse (!) 110, temperature (!) 97.4 F (36.3 C), temperature source Oral, resp. rate (!) 21, weight  81.7 kg, SpO2 99 %.  Physical Exam Constitutional:      General: He is not in acute distress.    Appearance: He is normal weight.  HENT:     Mouth/Throat:     Mouth: Mucous membranes are moist.     Pharynx: Oropharynx is clear.  Neck:     Musculoskeletal: Normal range of motion and neck supple.  Cardiovascular:     Rate and Rhythm: Normal rate. Rhythm irregular.     Pulses: Normal pulses.     Comments: Irregularly irregular Pulmonary:     Effort: Pulmonary effort is normal.     Breath sounds: Normal breath sounds. No wheezing or rales.  Abdominal:     General: Bowel sounds are normal. There is no distension.     Tenderness: There is no abdominal tenderness.  Genitourinary:    Comments: yellow urine in bag Musculoskeletal:        General: Tenderness (exquisite tenderness of his left knee to palpation without erythema, edema or warrmth) present.     Comments: Bilateral trace pitting edema  Skin:    General: Skin is warm and dry.  Neurological:     Mental Status: He is alert.     Comments: AAO x2   Lab Results Lab Results  Component Value Date   WBC 8.8 05/24/2019   HGB 7.8 (L) 05/24/2019   HCT 24.8 (L) 05/24/2019   MCV 84.4 05/24/2019   PLT 216 05/24/2019    Lab Results  Component Value Date   CREATININE 0.85 05/24/2019   BUN 16 05/24/2019   NA 135 05/24/2019   K 3.4 (L) 05/24/2019   CL 110 05/24/2019   CO2 16 (L) 05/24/2019    Lab Results  Component Value Date   ALT 43 05/22/2019   AST 18 05/22/2019   ALKPHOS 118 05/22/2019   BILITOT 1.4 (H) 05/22/2019    Microbiology: Recent Results (from the past 240 hour(s))  Blood Culture (routine x 2)     Status: None (Preliminary result)   Collection Time: 05/22/19  1:35 PM   Specimen: BLOOD RIGHT ARM  Result Value Ref Range Status   Specimen Description BLOOD RIGHT ARM  Final   Special Requests   Final    BOTTLES DRAWN AEROBIC AND ANAEROBIC Blood Culture results may not be optimal due to an excessive volume of  blood received in culture bottles   Culture  Setup Time   Final    GRAM POSITIVE COCCI IN BOTH AEROBIC AND ANAEROBIC BOTTLES Organism ID to follow CRITICAL RESULT CALLED TO, READ BACK BY AND VERIFIED WITH: RHRMD L SEAY @ 0604 05/23/19 BY S [email or web address]    Culture   Final    NO GROWTH 1 DAY Performed at [facility], [place] [address], [place], [place] [address]    Report Status PENDING  Incomplete  Blood Culture ID Panel (Reflexed)     Status: Abnormal   Collection Time: 05/22/19  1:35 PM  Result Value Ref Range Status   Enterococcus species DETECTED (A) NOT DETECTED Final    Comment: RHRMD L SEAY @ 2094 05/23/19 BY S GEZAHEGN   Vancomycin resistance NOT DETECTED NOT DETECTED Final   Listeria monocytogenes NOT DETECTED NOT DETECTED Final   Staphylococcus species NOT DETECTED NOT DETECTED Final   Staphylococcus aureus (BCID) NOT DETECTED NOT DETECTED Final   Streptococcus species NOT DETECTED NOT DETECTED Final   Streptococcus agalactiae NOT DETECTED NOT DETECTED Final   Streptococcus pneumoniae NOT DETECTED NOT DETECTED Final   Streptococcus pyogenes NOT DETECTED NOT DETECTED Final   Acinetobacter baumannii NOT DETECTED NOT DETECTED Final   Enterobacteriaceae species NOT DETECTED NOT DETECTED Final   Enterobacter cloacae complex NOT DETECTED NOT DETECTED Final   Escherichia coli NOT DETECTED NOT DETECTED Final   Klebsiella oxytoca NOT DETECTED NOT DETECTED Final   Klebsiella pneumoniae NOT DETECTED NOT DETECTED Final   Proteus species NOT DETECTED NOT DETECTED Final   Serratia marcescens NOT DETECTED NOT DETECTED Final   Haemophilus influenzae NOT DETECTED NOT DETECTED Final   Neisseria meningitidis NOT DETECTED NOT DETECTED Final   Pseudomonas aeruginosa NOT DETECTED NOT DETECTED Final   Candida albicans NOT DETECTED NOT DETECTED Final   Candida glabrata NOT DETECTED NOT DETECTED Final   Candida krusei NOT DETECTED NOT DETECTED Final   Candida parapsilosis NOT DETECTED  NOT  DETECTED Final   Candida tropicalis NOT DETECTED NOT DETECTED Final    Comment: Performed at [facility], [place] [address] [place], [place] [address]  Blood Culture (routine x 2)     Status: None (Preliminary result)   Collection Time: 05/22/19  1:50 PM   Specimen: BLOOD RIGHT FOREARM  Result Value Ref Range Status   Specimen Description BLOOD RIGHT FOREARM  Final   Special Requests   Final    BOTTLES DRAWN AEROBIC AND ANAEROBIC Blood Culture adequate volume   Culture  Setup Time   Final    GRAM POSITIVE COCCI IN BOTH AEROBIC AND ANAEROBIC BOTTLES CRITICAL VALUE NOTED.  VALUE IS CONSISTENT WITH PREVIOUSLY REPORTED AND CALLED VALUE.    Culture   Final    NO GROWTH 1 DAY Performed at [facility], [place] [address] [place], [place] [address]    Report Status PENDING  Incomplete  Urine culture     Status: Abnormal   Collection Time: 05/22/19  4:09 PM   Specimen: In/Out Cath Urine  Result Value Ref Range Status   Specimen Description IN/OUT CATH URINE  Final   Special Requests   Final    NONE Performed at [facility], [place] [address], [place], [place] [address]    Culture >=100,000 COLONIES/mL ENTEROCOCCUS FAECALIS (A)  Final   Report Status 05/24/2019 FINAL  Final   Organism ID, Bacteria ENTEROCOCCUS FAECALIS (A)  Final      Susceptibility   Enterococcus faecalis - MIC*    AMPICILLIN <=2 SENSITIVE Sensitive     LEVOFLOXACIN 2 SENSITIVE Sensitive     NITROFURANTOIN <=16 SENSITIVE Sensitive     VANCOMYCIN 1 SENSITIVE Sensitive     * >=100,000 COLONIES/mL ENTEROCOCCUS FAECALIS  SARS Coronavirus 2 (CEPHEID- Performed in [place] [facility] lab), Hosp Order     Status: None   Collection Time: 05/22/19  6:02 PM   Specimen: Nasopharyngeal Swab  Result Value Ref Range Status   SARS Coronavirus 2 NEGATIVE NEGATIVE Final    Comment: (NOTE) If result is NEGATIVE SARS-CoV-2 target nucleic acids are NOT DETECTED. The SARS-CoV-2 RNA is generally detectable in upper and  lower  respiratory specimens during the acute phase of infection. The lowest  concentration of SARS-CoV-2 viral copies this assay can detect is 250  copies / mL. A negative result does not preclude SARS-CoV-2 infection  and should not be used as the sole basis for treatment or other  patient management decisions.  A negative result may occur with  improper specimen collection / handling, submission of specimen other  than nasopharyngeal swab, presence of viral mutation(s) within the  areas targeted by this assay, and inadequate number of viral copies  (<250 copies / mL). A negative result must be combined with clinical  observations, patient history, and epidemiological information. If result is POSITIVE SARS-CoV-2 target nucleic acids are DETECTED. The SARS-CoV-2 RNA is generally detectable in upper and lower  respiratory specimens dur ing the acute phase of infection.  Positive  results are indicative of active infection with SARS-CoV-2.  Clinical  correlation with patient history and other diagnostic information is  necessary to determine patient infection status.  Positive results do  not rule out bacterial infection or co-infection with other viruses. If result is PRESUMPTIVE POSTIVE SARS-CoV-2 nucleic acids MAY BE PRESENT.   A presumptive positive result was obtained on the submitted specimen  and confirmed on repeat testing.  While 2019 novel coronavirus  (SARS-CoV-2)  nucleic acids may be present in the submitted sample  additional confirmatory testing may be necessary for epidemiological  and / or clinical management purposes  to differentiate between  SARS-CoV-2 and other Sarbecovirus currently known to infect humans.  If clinically indicated additional testing with an alternate test  methodology [id number]) is advised. The SARS-CoV-2 RNA is generally  detectable in upper and lower respiratory sp ecimens during the acute  phase of infection. The expected result is Negative.  Fact Sheet for Patients:  StrictlyIdeas.no Fact Sheet for Healthcare Providers: BankingDealers.co.za This test is not yet approved or cleared by the [place] FDA and has been authorized for detection and/or diagnosis of SARS-CoV-2 by FDA under an Emergency Use Authorization (EUA).  This EUA will remain in effect (meaning this test can be used) for the duration of the COVID-19 declaration under Section 564(b)(1) of the Act, 21 U.S.C. section 360bbb-3(b)(1), unless the authorization is terminated or revoked sooner. Performed at [facility], [place] [address] [place], [place] [address]   Respiratory Panel by PCR     Status: None   Collection Time: 05/22/19  9:53 PM   Specimen: Nasopharyngeal Swab; Respiratory  Result Value Ref Range Status   Adenovirus NOT DETECTED NOT DETECTED Final   Coronavirus 229E NOT DETECTED NOT DETECTED Final    Comment: (NOTE) The Coronavirus on the Respiratory Panel, DOES NOT test for the novel  Coronavirus (2019 nCoV)    Coronavirus HKU1 NOT DETECTED NOT DETECTED Final   Coronavirus NL63 NOT DETECTED NOT DETECTED Final   Coronavirus OC43 NOT DETECTED NOT DETECTED Final   Metapneumovirus NOT DETECTED NOT DETECTED Final   Rhinovirus / Enterovirus NOT DETECTED NOT DETECTED Final   Influenza A NOT DETECTED NOT DETECTED Final   Influenza B NOT DETECTED NOT DETECTED Final   Parainfluenza Virus 1 NOT DETECTED NOT DETECTED Final   Parainfluenza Virus 2 NOT DETECTED NOT DETECTED Final   Parainfluenza Virus 3 NOT DETECTED NOT DETECTED Final   Parainfluenza Virus 4 NOT DETECTED NOT DETECTED Final   Respiratory Syncytial Virus NOT DETECTED NOT DETECTED Final   Bordetella pertussis NOT DETECTED NOT DETECTED Final   Chlamydophila pneumoniae NOT DETECTED NOT DETECTED Final   Mycoplasma pneumoniae NOT DETECTED NOT DETECTED Final    Comment: Performed at [facility], [place]. [address], [place], [place]  [address]    Petronella Shuford K Chelcee Korpi, [place] [facility] [phone] pager   [phone] cell 05/24/2019, 9:29 AM

## 2019-05-24 NOTE — Progress Notes (Signed)
NAME:  LENA FIELDHOUSE, MRN:  [id number], DOB:  February 11, 1946, LOS: 2 ADMISSION DATE:  05/22/2019, CONSULTATION DATE:  05/23/2019 REFERRING MD:  Hospitalist CHIEF COMPLAINT:  AF   Brief History   73 yo M PMH chronic AF on AC, dementia A&Ox 1-2 at baseline, HTN, HLD, diastolic CHD, AVR, s/p ex lap for lysis of adhesions causing SBO (hospitalized at [facility] 6/24-05/20/2019) admitted 05/22/2019 for AMS and fever, T Max 104.69F.   Around 2200 RRT called for hypotension, 68/43, pale, cool, clammy, HR variable. He received additional 1L IVF. BP stabilized however pt remained lethargic, confused, with HR 130s-160s.   PCCM consulted for further management.   Past Medical History   has a past medical history of Aortic valve regurgitation (10/16/2018), BPH (benign prostatic hypertrophy), Chronic anticoagulation, Chronic atrial fibrillation, Diastolic dysfunction (October 2010), SBO, Hyperlipidemia, Hypertension, Patellar tendon rupture, left, initial encounter (02/10/2019), and Small bowel obstruction ([place]).  Significant Hospital Events    7/12 Transfer to ICU 7/12: Required vasoactive drips, placed on Precedex.  Cultures from blood showing enterococcus antibiotics narrowed 7/13 Off pressors. Transfer out of ICU  Consults:  General Surgery PCCM   Procedures:  NA  Significant Diagnostic Tests:  NA  Micro Data:  COVID 19 7/11>>negative  RVP 7/11>> negative Blood 7/11>> E fecalis. Pan sensitive Urine 7/11>> E fecalis  Antimicrobials:  Acyclovir 7/11>> 7/12 Ampicillin 7/11>> 7/12 Cefepime 7/11>> 7/12 Metronidazole 7/11>> 7/12 Vancomycin 7/11>> 7/12 Unasyn 7/12>>>  Interim history/subjective:  Awake, confused secondary to dementia Off pressors since yesterday afternoon. Continues on amiodarone for atrial fibrillation  Objective   Blood pressure 104/70, pulse (!) 110, temperature (!) 97.4 F (36.3 C), temperature source Oral, resp. rate (!) 21, weight 81.7 kg, SpO2 99 %.         Intake/Output Summary (Last 24 hours) at 05/24/2019 0824 Last data filed at 05/24/2019 0800 Gross per 24 hour  Intake 3548.88 ml  Output 1985 ml  Net 1563.88 ml   Filed Weights   05/23/19 0245  Weight: 81.7 kg    Examination: Gen:      No acute distress HEENT:  EOMI, sclera anicteric Neck:     No masses; no thyromegaly Lungs:    Clear to auscultation bilaterally; normal respiratory effort CV:         Regular rate and rhythm; no murmurs Abd:      Abd dressing intact. + bowel sounds; soft, non-tender; no palpable masses, no distension Ext:    1+ edema; adequate peripheral perfusion Skin:      Warm and dry; no rash Neuro: Awake, responds to commands  Resolved Hospital Problem list   NA  Assessment & Plan:  Severe sepsis/septic shock Enterococcus UTI and bacteremia -CT abdomen with dilated small bowel without defined transition point.  Felt to reflect adynamic ileus but also consider partial small bowel obstruction.  No other acute findings within the abdomen and pelvis. Plan Continue Unasyn Continue LR until he can eat reliably  Mild relative adrenal insufficiency Serum cortisol 17.8, would expect this to be higher in the setting of acute critical illness and sepsis however we are weaning vasoactive drips Plan No need for stress dose steroids as he has remained hemodynamically stable.  Acute metabolic encephalopathy c/b dementia, suspect secondary to sepsis. -CT of brain was negative for acute infarct, hemorrhage, hydrocephalus or other findings to explain current mental status change Plan Off Precedex He is at baseline mental status.  History of dementia.  Fluid and electrolyte imbalance: Hypokalemia Replace potassium  Chronic AF with RVR-on chronic diltiazem Plan Continue telemetry monitoring Continuing amiodarone for now PO Cardizem. Restart coumadin.  Anemia-appears stable, slight drop as expected with volume resuscitation efforts no evidence of bleeding Plan  Continue to trend CBC  Best practice:  Diet: PO diet Pain/Anxiety/Delirium protocol (if indicated): yes VAP protocol (if indicated): not indicated DVT prophylaxis: Coumadin GI prophylaxis: famotidine Glucose control: SSI  Mobility: Bedrest  Code Status: FULL Family Communication: Updated wife, Trevonn Hallum, by phone 7/13 Disposition: Ok to transfer to PCU  Marshell Garfinkel MD [facility] Pager [phone] If no answer call [phone] 05/24/2019, 8:35 AM

## 2019-05-24 NOTE — Progress Notes (Signed)
Patient ID: Russell Dillon, male   DOB: 29-Apr-1946, 73 y.o.   MRN: [id number]       Subjective: Pt sitting up and demented.  Spitting out some of his food and medications.  Denies abdominal pain.  Hasn't had a BM since he left.  Objective: Vital signs in last 24 hours: Temp:  [97.4 F (36.3 C)-100 F (37.8 C)] 97.4 F (36.3 C) (07/13 0748) Pulse Rate:  [82-133] 102 (07/13 1100) Resp:  [12-34] 15 (07/13 1100) BP: (79-110)/(47-85) 93/60 (07/13 1100) SpO2:  [96 %-100 %] 100 % (07/13 1100) Last BM Date: (PTA)  Intake/Output from previous day: 07/12 0701 - 07/13 0700 In: 4353.8 [P.O.:100; I.V.:2894.7; IV Piggyback:1359.1] Out: 1895 [Urine:1895] Intake/Output this shift: Total I/O In: 591.7 [P.O.:75; I.V.:377.1; IV Piggyback:139.6] Out: 9 [Urine:555]  PE: Abd: soft, NT, ND, midline wound is clean and packed, +BS  Lab Results:  Recent Labs    05/24/19 0259 05/24/19 0648  WBC 10.2 8.8  HGB 7.7* 7.8*  HCT 24.7* 24.8*  PLT 221 216   BMET Recent Labs    05/23/19 1346 05/24/19 0259  NA 137 135  K 4.0 3.4*  CL 112* 110  CO2 15* 16*  GLUCOSE 106* 95  BUN 17 16  CREATININE 0.87 0.85  CALCIUM 8.2* 8.2*   PT/INR Recent Labs    05/23/19 0145 05/24/19 0259  LABPROT 20.9* 22.4*  INR 1.8* 2.0*   CMP     Component Value Date/Time   NA 135 05/24/2019 0259   NA 135 07/16/2017 1348   K 3.4 (L) 05/24/2019 0259   CL 110 05/24/2019 0259   CO2 16 (L) 05/24/2019 0259   GLUCOSE 95 05/24/2019 0259   BUN 16 05/24/2019 0259   BUN 13 07/16/2017 1348   CREATININE 0.85 05/24/2019 0259   CALCIUM 8.2 (L) 05/24/2019 0259   CALCIUM 8.2 (L) 02/10/2019 0302   PROT 7.6 05/22/2019 1340   ALBUMIN 2.4 (L) 05/23/2019 0525   AST 18 05/22/2019 1340   ALT 43 05/22/2019 1340   ALKPHOS 118 05/22/2019 1340   BILITOT 1.4 (H) 05/22/2019 1340   GFRNONAA >60 05/24/2019 0259   GFRAA >60 05/24/2019 0259   Lipase     Component Value Date/Time   LIPASE 24 05/05/2019 1707        Studies/Results: Ct Head Wo Contrast  Result Date: 05/23/2019 CLINICAL DATA:  Altered mental status. Recent laparotomy on 05/11/2019 EXAM: CT HEAD WITHOUT CONTRAST TECHNIQUE: Contiguous axial images were obtained from the base of the skull through the vertex without intravenous contrast. COMPARISON:  Head CT, 03/18/2019 FINDINGS: Brain: No evidence of acute infarction, hemorrhage, hydrocephalus, extra-axial collection or mass lesion/mass effect. There is ventricular sulcal enlargement reflecting mild to moderate atrophy, stable from the prior study. Patchy periventricular white matter hypoattenuation is also stable consistent with mild chronic microvascular ischemic change. Vascular: No hyperdense vessel or unexpected calcification. Skull: Normal. Negative for fracture or focal lesion. Sinuses/Orbits: Globes and orbits are unremarkable. Sinuses and mastoid air cells are clear. Other: None. IMPRESSION: 1. No acute intracranial abnormalities. 2. Atrophy and mild chronic microvascular ischemic change, stable from the prior head CT. Electronically Signed   By: Lajean Manes M.D.   On: 05/23/2019 07:05   Ct Abdomen Pelvis W Contrast  Result Date: 05/23/2019 CLINICAL DATA:  Altered mental status. S/p recent laparotomy on 6/30. Pt also having generalized abd pain EXAM: CT ABDOMEN AND PELVIS WITH CONTRAST TECHNIQUE: Multidetector CT imaging of the abdomen and pelvis was performed using the standard  protocol following bolus administration of intravenous contrast. CONTRAST:  188mL OMNIPAQUE IOHEXOL 300 MG/ML  SOLN COMPARISON:  Abdomen radiographs, 05/19/2019. Abdomen pelvis CT, 05/05/2019. FINDINGS: Lower chest: Minimal pleural effusions. Dependent lung base atelectasis. Hepatobiliary: Normal liver. Small dependent gallstones. No gallbladder wall thickening or adjacent inflammation. No bile duct dilation. Pancreas: Unremarkable. No pancreatic ductal dilatation or surrounding inflammatory changes. Spleen:  Normal in size without focal abnormality. Adrenals/Urinary Tract: No adrenal masses. Bilateral renal cortical thinning. 8 mm low-density lesion protrudes from the cortex of the left kidney lower pole consistent with a cyst, similar to the prior CT. No other renal masses or lesions. No stones. No hydronephrosis. Normal ureters. Bladder is mostly decompressed by Foley catheter. Stomach/Bowel: Normal stomach. There are dilated loops of small bowel, most prominent in the anterior upper abdomen, measuring 5.4 cm in diameter. No discrete transition point. Colon is normal in caliber. No bowel wall thickening. No adjacent inflammation. Vascular/Lymphatic: Aortic atherosclerosis. No enlarged abdominal or pelvic lymph nodes. Reproductive: Significant prosthetic enlargement. Prostate measures 7.4 x 7.7 x 7.0 cm. Other: Anterior abdominal wall midline incision.  No ascites. Musculoskeletal: Chronic bilateral pars defects at L5-S1 minor anterolisthesis. No acute fractures. Benign bone island in the right ilium, stable. No osteolytic lesions. IMPRESSION: 1. Dilated small bowel most evident in the anterior upper abdomen. There is no defined transition point. This likely reflects an adynamic ileus, but could reflect a partial small bowel obstruction. 2. No other acute abnormality within the abdomen or pelvis. 3. Minimal pleural effusions and associated lung base atelectasis. 4. Gallstones, but no acute cholecystitis. 5. Aortic atherosclerosis. Electronically Signed   By: Lajean Manes M.D.   On: 05/23/2019 07:15   Dg Chest Port 1 View  Result Date: 05/22/2019 CLINICAL DATA:  Fevers, history of recent surgery for small bowel obstruction EXAM: PORTABLE CHEST 1 VIEW COMPARISON:  05/12/2019 FINDINGS: Cardiac shadow is stable. Aortic calcifications are seen. Lungs are hypoinflated but clear. No acute abnormality noted. Right-sided PICC line has been removed in the interval. IMPRESSION: No acute abnormality noted. Electronically  Signed   By: Inez Catalina M.D.   On: 05/22/2019 15:48    Anti-infectives: Anti-infectives (From admission, onward)   Start     Dose/Rate Route Frequency Ordered Stop   05/23/19 1000  Ampicillin-Sulbactam (UNASYN) 3 g in sodium chloride 0.9 % 100 mL IVPB     3 g 200 mL/hr over 30 Minutes Intravenous Every 6 hours 05/23/19 0843     05/23/19 0800  ampicillin (OMNIPEN) 2 g in sodium chloride 0.9 % 100 mL IVPB  Status:  Discontinued     2 g 300 mL/hr over 20 Minutes Intravenous Every 4 hours 05/23/19 0610 05/23/19 0843   05/23/19 0400  vancomycin (VANCOCIN) IVPB 1000 mg/200 mL premix  Status:  Discontinued     1,000 mg 200 mL/hr over 60 Minutes Intravenous Every 12 hours 05/22/19 1813 05/23/19 0843   05/22/19 2200  ampicillin (OMNIPEN) 2 g in sodium chloride 0.9 % 100 mL IVPB     2 g 300 mL/hr over 20 Minutes Intravenous  Once 05/22/19 2153 05/23/19 1456   05/22/19 2200  acyclovir (ZOVIRAX) 795 mg in dextrose 5 % 150 mL IVPB     10 mg/kg  79.3 kg 165.9 mL/hr over 60 Minutes Intravenous  Once 05/22/19 2153 05/23/19 1459   05/22/19 2200  metroNIDAZOLE (FLAGYL) IVPB 500 mg  Status:  Discontinued     500 mg 100 mL/hr over 60 Minutes Intravenous Every 8 hours 05/22/19 2153 05/23/19  5885   05/22/19 2200  ceFEPIme (MAXIPIME) 2 g in sodium chloride 0.9 % 100 mL IVPB  Status:  Discontinued     2 g 200 mL/hr over 30 Minutes Intravenous Every 8 hours 05/22/19 1813 05/23/19 0843   05/22/19 1900  acyclovir (ZOVIRAX) 795 mg in dextrose 5 % 150 mL IVPB  Status:  Discontinued     10 mg/kg  79.3 kg 165.9 mL/hr over 60 Minutes Intravenous Every 8 hours 05/22/19 1813 05/23/19 0843   05/22/19 1900  ampicillin (OMNIPEN) 2 g in sodium chloride 0.9 % 100 mL IVPB  Status:  Discontinued     2 g 300 mL/hr over 20 Minutes Intravenous Every 6 hours 05/22/19 1813 05/23/19 0610   05/22/19 1400  vancomycin (VANCOCIN) 1,500 mg in sodium chloride 0.9 % 500 mL IVPB     1,500 mg 250 mL/hr over 120 Minutes Intravenous   Once 05/22/19 1332 05/22/19 1824   05/22/19 1330  ceFEPIme (MAXIPIME) 2 g in sodium chloride 0.9 % 100 mL IVPB     2 g 200 mL/hr over 30 Minutes Intravenous  Once 05/22/19 1321 05/22/19 1515   05/22/19 1330  metroNIDAZOLE (FLAGYL) IVPB 500 mg     500 mg 100 mL/hr over 60 Minutes Intravenous  Once 05/22/19 1321 05/22/19 1516   05/22/19 1330  vancomycin (VANCOCIN) IVPB 1000 mg/200 mL premix  Status:  Discontinued     1,000 mg 200 mL/hr over 60 Minutes Intravenous  Once 05/22/19 1321 05/22/19 1332       Assessment/Plan Atrial fibrillation  HTN HLD BPH Dementia Recent left patella tendon repair 02/11/2019 Malnutrition   S/P exploratory laparotomy with lysis of adhesions by Dr. Donne Hazel on 6/30 -BID wet to dry dressing changes - dulcolax suppository and miralax (if he will drink it) -pureed diet  Acute metabolic encephalopathy Severe sepsis, enterococcus UTI and bacteremia  - on admission fever of 104.2, A. fib with RVR with rates into the 150s, elevated WBC count of 15.6, lactic acid of 2.0, urosepsis    FEN: pureed diet, dulcolax, miralax, colace (spitting some of this out) VTE: coumadin ID: Unasyn Follow up: Dr. Nat Christen - wife Asim Gersten [phone] or [phone]   LOS: 2 days    Henreitta Cea , [facility] 05/24/2019, 11:07 AM Pager: [phone]

## 2019-05-24 NOTE — Evaluation (Signed)
Physical Therapy Evaluation Patient Details Name: Russell Dillon MRN: [id number] DOB: [birth year] Today's Date: 05/24/2019   History of Present Illness  Pt is a 73 y/o male admitted secondary to worsening abdominal pain. Pt found to have SBO and is s/p exploratory laparotomy. Pt also with sepsis secondary to UTI. Pt with ICU stay following hypotension. REcent d/c from hospital ~one week ago. PMH includes a fib, dementia, HTN, and L patellar tendon repair. PMH includes a fib, dementia, HTN, and L patellar tendon repair  Clinical Impression  Pt PLOF and home setup collected from notes from prior hospitalization 1 week ago. On entry pt sitting upright in bed awake, however refuses to attempt any mobility. Therapy able to convince pt to allow bed to be placed in chair position. Once in chair position, pt agreeable to ankle and knee AROM but refuses hip movement. Therapy careful to limit touching pt as it increases his agitation. Pt answered limited questions about home and then gets frustrated and request therapy to talk to wife. Pt appears to be at same level of function at d/c 1 week ago. PT recommends SNF level rehab at discharge. Pt's wife will likely refuse in which case pt will need to remain current with [place], [place], [facility] and [facility] to work on regaining mobility. PT will continue to follow acutely.    Follow Up Recommendations SNF    Equipment Recommendations  None recommended by PT       Precautions / Restrictions Precautions Precautions: Fall Precaution Comments: L knee sensitive to touch Restrictions Weight Bearing Restrictions: No LLE Weight Bearing: Weight bearing as tolerated      Mobility  Bed Mobility               General bed mobility comments: bed in chair position set up, pt tolerated well, defers further mobility despite several attempts              Pertinent Vitals/Pain Pain Assessment: Faces Faces Pain Scale: No hurt Pain Location: L knee when  touched, but when asked says no pain Pain Descriptors / Indicators: Grimacing;Guarding Pain Intervention(s): Limited activity within patient's tolerance;Monitored during session    Home Living Family/patient expects to be discharged to:: Private residence Living Arrangements: Spouse/significant other Available Help at Discharge: Family;Personal care attendant;Available 24 hours/day Type of Home: House Home Access: Ramped entrance     Home Layout: Two level;Bed/bath upstairs Home Equipment: Bedside commode;Hospital bed;Wheelchair - Education administrator (comment);Walker - 2 wheels(slide board, hoyer) Additional Comments: Per wife, pt  with caregiver that assists with bathing and changing pt in the morning, afternoon, and evening for a couple hours.     Prior Function Level of Independence: Needs assistance   Gait / Transfers Assistance Needed: Needs assist with transfers using slide board or hoyer lift depending on the day. Wife reports she has trouble using hoyer secondary to shoulder injury.   ADL's / Homemaking Assistance Needed: Caregiver assists with ADLs.   Comments: pt unable to offer information regarding home set up or PLOF; info taken from previous admin     Hand Dominance        Extremity/Trunk Assessment   Upper Extremity Assessment RUE Deficits / Details: pt with limited shoulder AROM, unable to fully assess as pt becoming agitated with attempts    Lower Extremity Assessment Lower Extremity Assessment: Generalized weakness;LLE deficits/detail;RLE deficits/detail RLE Deficits / Details: pt agrees to ankle and knee movement, grossly 2+/5 strength, refuses hip movement and becomes aggitated with encouragement to perform hip flexion  LLE Deficits / Details: Hx of L patellar tendon repair and pt very tender to touch right below knee pt agrees to ankle and knee movement, grossly 2+/5 strength, refuses hip movement and becomes aggitated with encouragement to perform hip flexion         Communication   Communication: No difficulties  Cognition Arousal/Alertness: Awake/alert Behavior During Therapy: Flat affect;Agitated Overall Cognitive Status: History of cognitive impairments - at baseline                                 General Comments: pt resistant to OT, denying all engagement in BADL. Pt somewhat agitated, denying mobility progression despite education and encouragement. States "well go get the car and take me home to help me"      General Comments General comments (skin integrity, edema, etc.): VSS        Assessment/Plan    PT Assessment Patient needs continued PT services  PT Problem List Decreased strength;Decreased activity tolerance;Decreased balance;Decreased mobility;Decreased cognition;Decreased knowledge of use of DME;Decreased safety awareness;Decreased knowledge of precautions       PT Treatment Interventions Functional mobility training;Therapeutic activities;Therapeutic exercise;Balance training;Patient/family education;Wheelchair mobility training    PT Goals (Current goals can be found in the Care Plan section)  Acute Rehab PT Goals Patient Stated Goal: to go home PT Goal Formulation: Patient unable to participate in goal setting Time For Goal Achievement: 06/07/19 Potential to Achieve Goals: Fair    Frequency Min 2X/week   Barriers to discharge        Co-evaluation PT/OT/SLP Co-Evaluation/Treatment: Yes Reason for Co-Treatment: For patient/therapist safety;Necessary to address cognition/behavior during functional activity PT goals addressed during session: Mobility/safety with mobility;Balance;Strengthening/ROM OT goals addressed during session: ADL's and self-care;Strengthening/ROM       AM-PAC PT "6 Clicks" Mobility  Outcome Measure Help needed turning from your back to your side while in a flat bed without using bedrails?: Total Help needed moving from lying on your back to sitting on the side of a flat  bed without using bedrails?: Total Help needed moving to and from a bed to a chair (including a wheelchair)?: Total Help needed standing up from a chair using your arms (e.g., wheelchair or bedside chair)?: Total Help needed to walk in hospital room?: Total Help needed climbing 3-5 steps with a railing? : Total 6 Click Score: 6    End of Session   Activity Tolerance: Treatment limited secondary to agitation Patient left: with call bell/phone within reach;with bed alarm set Nurse Communication: Mobility status PT Visit Diagnosis: Muscle weakness (generalized) (M62.81);Difficulty in walking, not elsewhere classified (R26.2);Unsteadiness on feet (R26.81)    Time: 0998-3382 PT Time Calculation (min) (ACUTE ONLY): 19 min   Charges:   PT Evaluation $PT Eval Moderate Complexity: 1 Mod          Teron Blais B. Migdalia Dk PT, DPT Acute Rehabilitation Services Pager [phone] Office [phone]   [place] 05/24/2019, 1:03 PM

## 2019-05-24 NOTE — Progress Notes (Signed)
  Speech Language Pathology Treatment: Dysphagia  Patient Details Name: Russell Dillon MRN: [id number] DOB: 1946/03/11 Today's Date: 05/24/2019 Time: 1210-1230 SLP Time Calculation (min) (ACUTE ONLY): 20 min  Assessment / Plan / Recommendation Clinical Impression  Patient seen to address dysphagia goals, however he would only accept one spoon bite of puree solids and refused all liquids. He remained calm when talking with SLP, was oriented to self and place, but when PO's were introduced, he became agitated and refuse. He spit out half of spoon bite of puree solids, "I knew that would taste bad!" and declined any other PO's. Per RN, he has been spitting out PO's of meds, etc and continues to refuse meals. Per review of patient's history in chart, he was exhibiting a decline in PO intake at home as well, which impacts prognosis for improvement. SLP will follow briefly but if he continues to refuse PO's, may need a Palliative care consult.    HPI HPI: Russell Dillon is a 73 y.o. male with medical history significant of permanent atrial fibrillation, dementia, essential hypertension, HLD, diastolic congestive heart failure, aortic valve regurgitation, BPH, and recent small bowel obstruction with recent surgical intervention with lysis of adhesions who presents with chief complaint of progressive confusion and fever.  Patient with dementia, wife at bedside who gives majority of history of present illness.  Following recent hospitalization, was discharged on 05/20/2019 following small bowel obstruction.  Since returning home he has not had a bowel movement and very poor oral intake.  Last night, spouse reports progressive confusion with a fever of 102.3.  History of urinary tract infections in the past. Head CT 7/12 no acute findings; CXR 7/11 no acute findings      SLP Plan  Continue with current plan of care       Recommendations  Diet recommendations: Dysphagia 1 (puree);Thin  liquid Medication Administration: Other (Comment)(crushed in puree if patient accepts) Supervision: Full supervision/cueing for compensatory strategies Compensations: Minimize environmental distractions;Slow rate;Small sips/bites                Oral Care Recommendations: Oral care BID Follow up Recommendations: Skilled Nursing facility;24 hour supervision/assistance SLP Visit Diagnosis: Dysphagia, unspecified (R13.10) Plan: Continue with current plan of care       GO                Dannial Monarch 05/24/2019, 5:19 PM  Sonia Baller, MA, [place] [facility] Pager: [phone]

## 2019-05-24 NOTE — Evaluation (Signed)
Occupational Therapy Evaluation Patient Details Name: Russell Dillon MRN: [id number] DOB: [birth year] Today's Date: 05/24/2019    History of Present Illness Pt is a 73 y/o male admitted secondary to worsening abdominal pain. Pt found to have SBO and is s/p exploratory laparotomy. Pt also with sepsis secondary to UTI. Pt with ICU stay following hypotension. REcent d/c from hospital ~one week ago. PMH includes a fib, dementia, HTN, and L patellar tendon repair. PMH includes a fib, dementia, HTN, and L patellar tendon repair   Clinical Impression   Pt admitted with above diagnoses, per chart review recent admin to this facility about 1 week ago.  PTA, pt dependent for BADL except for self feeding, has HHAs and wife for care giving. Pt uses lift for mobility and w/c. At time of eval, he is poor historian and slightly agitated, resistant to mobility despite encouragement and education. Pt also deferred further BADL engagement, stating "I'm not stupid I know how to wash my face and brush my teeth". Pt placed in bed in chair position for mobility progression, tolerated but deferred further progression. At this time recommend HHOT vs SNF pending wife's ability to provide continued care. Pt needs continued 24/7 assist and caregiving, if this is not available in home environment pt will need SNF placement.    Follow Up Recommendations  Home health OT;Supervision/Assistance - 24 hour;SNF(pending wife ability to continue care)    Equipment Recommendations  None recommended by OT    Recommendations for Other Services       Precautions / Restrictions Precautions Precautions: Fall Precaution Comments: L knee sensitive to touch Restrictions Weight Bearing Restrictions: No LLE Weight Bearing: Weight bearing as tolerated      Mobility Bed Mobility               General bed mobility comments: bed in chair position set up, pt tolerated well, defers further mobility despite several  attempts  Transfers                      Balance                                           ADL either performed or assessed with clinical judgement   ADL Overall ADL's : Needs assistance/impaired Eating/Feeding: Set up;Sitting Eating/Feeding Details (indicate cue type and reason): to give self water                                   General ADL Comments: pt currently dependent for all ADL other than self feeding     Vision Baseline Vision/History: Wears glasses Wears Glasses: At all times Patient Visual Report: No change from baseline       Perception     Praxis      Pertinent Vitals/Pain Pain Assessment: Faces Pain Location: L knee when touched, but when asked says no pain Pain Descriptors / Indicators: Grimacing;Guarding Pain Intervention(s): Monitored during session     Hand Dominance     Extremity/Trunk Assessment Upper Extremity Assessment RUE Deficits / Details: pt with limited shoulder AROM, unable to fully assess as pt becoming agitated with attempts   Lower Extremity Assessment Lower Extremity Assessment: Defer to PT evaluation LLE Deficits / Details: Hx of L patellar tendon repair and pt very tender to touch right  below knee       Communication Communication Communication: No difficulties   Cognition Arousal/Alertness: Awake/alert Behavior During Therapy: Flat affect;Agitated Overall Cognitive Status: History of cognitive impairments - at baseline                                 General Comments: pt resistant to OT, denying all engagement in BADL. Pt somewhat agitated, denying mobility progression despite education and encouragement. States "well go get the car and take me home to help me"   General Comments       Exercises     Shoulder Instructions      Home Living Family/patient expects to be discharged to:: Private residence Living Arrangements: Spouse/significant other Available  Help at Discharge: Family;Personal care attendant;Available 24 hours/day Type of Home: House Home Access: Ramped entrance     Home Layout: Two level;Bed/bath upstairs Alternate Level Stairs-Number of Steps: pt sleeps in hospital bed on first floor   Bathroom Shower/Tub: Walk-in shower   Bathroom Toilet: Standard Bathroom Accessibility: Yes   Home Equipment: Bedside commode;Hospital bed;Wheelchair - Education administrator (comment);Walker - 2 wheels(slide board, hoyer)   Additional Comments: Per wife, pt  with caregiver that assists with bathing and changing pt in the morning, afternoon, and evening for a couple hours.       Prior Functioning/Environment Level of Independence: Needs assistance  Gait / Transfers Assistance Needed: Needs assist with transfers using slide board or hoyer lift depending on the day. Wife reports she has trouble using hoyer secondary to shoulder injury.  ADL's / Homemaking Assistance Needed: Caregiver assists with ADLs.    Comments: pt unable to offer information regarding home set up or PLOF; info taken from previous admin        OT Problem List: Decreased strength;Decreased range of motion;Decreased activity tolerance;Decreased safety awareness;Decreased cognition;Pain;Decreased knowledge of use of DME or AE;Impaired balance (sitting and/or standing)      OT Treatment/Interventions: Self-care/ADL training;Neuromuscular education;DME and/or AE instruction;Patient/family education;Balance training;Therapeutic activities    OT Goals(Current goals can be found in the care plan section) Acute Rehab OT Goals Patient Stated Goal: to go home OT Goal Formulation: With patient Time For Goal Achievement: 06/07/19 Potential to Achieve Goals: Fair  OT Frequency: Min 1X/week   Barriers to D/C:            Co-evaluation PT/OT/SLP Co-Evaluation/Treatment: Yes Reason for Co-Treatment: For patient/therapist safety;Necessary to address cognition/behavior during functional  activity PT goals addressed during session: Mobility/safety with mobility OT goals addressed during session: ADL's and self-care;Strengthening/ROM      AM-PAC OT "6 Clicks" Daily Activity     Outcome Measure Help from another person eating meals?: A Little Help from another person taking care of personal grooming?: Total Help from another person toileting, which includes using toliet, bedpan, or urinal?: Total Help from another person bathing (including washing, rinsing, drying)?: Total Help from another person to put on and taking off regular upper body clothing?: Total Help from another person to put on and taking off regular lower body clothing?: Total 6 Click Score: 8   End of Session Nurse Communication: Mobility status  Activity Tolerance: Treatment limited secondary to agitation Patient left:    OT Visit Diagnosis: Muscle weakness (generalized) (M62.81);Other symptoms and signs involving cognitive function                Time: 2951-8841 OT Time Calculation (min): 19 min Charges:  OT General Charges $  OT Visit: 1 Visit OT Evaluation $OT Eval Moderate Complexity: 1 Mod  Zenovia Jarred, MSOT, OTR/L Behavioral Health OT/ Acute Relief OT [facility] Office: [place] 05/24/2019, 10:25 AM

## 2019-05-25 DIAGNOSIS — N390 Urinary tract infection, site not specified: Secondary | ICD-10-CM

## 2019-05-25 DIAGNOSIS — N3000 Acute cystitis without hematuria: Secondary | ICD-10-CM

## 2019-05-25 LAB — BASIC METABOLIC PANEL
Anion gap: 7 (ref 5–15)
BUN: 11 mg/dL (ref 8–23)
CO2: 19 mmol/L — ABNORMAL LOW (ref 22–32)
Calcium: 8.3 mg/dL — ABNORMAL LOW (ref 8.9–10.3)
Chloride: 111 mmol/L (ref 98–111)
Creatinine, Ser: 0.81 mg/dL (ref 0.61–1.24)
GFR calc Af Amer: >60 mL/min (ref >60)
GFR calc non Af Amer: >60 mL/min (ref >60)
Glucose, Bld: 80 mg/dL (ref 70–99)
Potassium: 3.4 mmol/L — ABNORMAL LOW (ref 3.5–5.1)
Sodium: 137 mmol/L (ref 135–145)

## 2019-05-25 LAB — CBC WITH DIFFERENTIAL/PLATELET
Abs Immature Granulocytes: 0.02 10*3/uL (ref 0.00–0.07)
Basophils Absolute: 0 10*3/uL (ref 0.0–0.1)
Basophils Relative: 0 %
Eosinophils Absolute: 0.3 10*3/uL (ref 0.0–0.5)
Eosinophils Relative: 5 %
HCT: 27.3 % — ABNORMAL LOW (ref 39.0–52.0)
Hemoglobin: 8.6 g/dL — ABNORMAL LOW (ref 13.0–17.0)
Immature Granulocytes: 0 %
Lymphocytes Relative: 12 %
Lymphs Abs: 0.8 10*3/uL (ref 0.7–4.0)
MCH: 26.2 pg (ref 26.0–34.0)
MCHC: 31.5 g/dL (ref 30.0–36.0)
MCV: 83.2 fL (ref 80.0–100.0)
Monocytes Absolute: 0.3 10*3/uL (ref 0.1–1.0)
Monocytes Relative: 5 %
Neutro Abs: 5.3 10*3/uL (ref 1.7–7.7)
Neutrophils Relative %: 78 %
Platelets: 249 10*3/uL (ref 150–400)
RBC: 3.28 MIL/uL — ABNORMAL LOW (ref 4.22–5.81)
RDW: 21.2 % — ABNORMAL HIGH (ref 11.5–15.5)
WBC: 6.7 10*3/uL (ref 4.0–10.5)
nRBC: 0 % (ref 0.0–0.2)

## 2019-05-25 LAB — PROTIME-INR
INR: 1.8 — ABNORMAL HIGH (ref 0.8–1.2)
Prothrombin Time: 20.9 seconds — ABNORMAL HIGH (ref 11.4–15.2)

## 2019-05-25 LAB — CULTURE, BLOOD (ROUTINE X 2): Special Requests: ADEQUATE

## 2019-05-25 LAB — MAGNESIUM: Magnesium: 1.7 mg/dL (ref 1.7–2.4)

## 2019-05-25 LAB — PHOSPHORUS: Phosphorus: 2.4 mg/dL — ABNORMAL LOW (ref 2.5–4.6)

## 2019-05-25 MED ORDER — WARFARIN SODIUM 3 MG PO TABS
3.0000 mg | ORAL_TABLET | Freq: Once | ORAL | Status: AC
  Administered 2019-05-25: 3 mg via ORAL
  Filled 2019-05-25: qty 1

## 2019-05-25 MED ORDER — DIPHENHYDRAMINE HCL 50 MG/ML IJ SOLN
25.0000 mg | Freq: Once | INTRAMUSCULAR | Status: AC | PRN
  Administered 2019-05-25: 25 mg via INTRAVENOUS
  Filled 2019-05-25: qty 1

## 2019-05-25 MED ORDER — AMOXICILLIN 500 MG PO CAPS
500.0000 mg | ORAL_CAPSULE | Freq: Three times a day (TID) | ORAL | Status: AC
  Administered 2019-05-25 – 2019-05-28 (×9): 500 mg via ORAL
  Filled 2019-05-25 (×9): qty 1

## 2019-05-25 NOTE — Progress Notes (Signed)
[place] Progress Note Patient Name: Russell Dillon DOB: November 09, 1946 MRN: [id number]   Date of Service  05/25/2019  HPI/Events of Note  Patient refusing his oral meds including cardizem. He is irritated when stimulated as per nurse.  eICU Interventions  Metoprolol 5 mg IV ordered with parameters tos give. Will order Benadryl 25 mg IV and hopefully patient will be better after some sleep.     Intervention Category Major Interventions: Arrhythmia - evaluation and management Minor Interventions: Agitation / anxiety - evaluation and management  Shona Needles Chante Mayson 05/25/2019, 1:01 AM

## 2019-05-25 NOTE — Progress Notes (Signed)
Patient ID: Russell Dillon, male   DOB: August 21, 1946, 73 y.o.   MRN: [id number]         [facility]  Date of Admission:  05/22/2019   Total days of antibiotics 4        Day 3 ampicillin sulbactam         ASSESSMENT: He has improved promptly on therapy for uncomplicated enterococcal bacteremia caused by a urinary tract infection.  I will switch him to oral amoxicillin and plan on 3 more days of treatment.  PLAN: 1. Change to amoxicillin and treat for 3 more days 2. I will sign off now  Principal Problem:   Bacteremia due to Enterococcus Active Problems:   Persistent atrial fibrillation with RVR   Chronic diastolic CHF (congestive heart failure) (HCC)   Urinary tract infection   Scheduled Meds: . Chlorhexidine Gluconate Cloth  6 each Topical Daily  . colchicine  0.6 mg Oral BID  . diltiazem  30 mg Oral Q6H  . docusate sodium  100 mg Oral BID  . donepezil  10 mg Oral QHS  . famotidine  20 mg Oral BID  . finasteride  5 mg Oral q morning - 10a  . lithium carbonate  150 mg Oral QHS  . mouth rinse  15 mL Mouth Rinse BID  . polyethylene glycol  17 g Oral Daily  . potassium chloride  40 mEq Oral Once  . sodium chloride flush  3 mL Intravenous Once  . Warfarin - Pharmacist Dosing Inpatient   Does not apply q1800   Continuous Infusions: . sodium chloride    . amiodarone Stopped (05/24/19 1030)  . ampicillin-sulbactam (UNASYN) IV 3 g (05/25/19 0918)  . dexmedetomidine (PRECEDEX) IV infusion Stopped (05/23/19 0829)  . lactated ringers    . lactated ringers 75 mL/hr at 05/25/19 0455  . phenylephrine (NEO-SYNEPHRINE) Adult infusion Stopped (05/23/19 1313)   PRN Meds:.sodium chloride, acetaminophen **OR** acetaminophen, bisacodyl, metoprolol tartrate, ondansetron **OR** ondansetron (ZOFRAN) IV, senna-docusate   SUBJECTIVE: He says that he is feeling better.  Review of Systems: Review of Systems  Unable to perform ROS: Mental acuity    No Known  Allergies  OBJECTIVE: Vitals:   05/24/19 2107 05/25/19 0009 05/25/19 0504 05/25/19 0930  BP: 109/77 (!) 104/91 114/88 109/66  Pulse: 90 94 99 87  Resp:  16 12   Temp:  97.8 F (36.6 C) 97.9 F (36.6 C) 97.6 F (36.4 C)  TempSrc:  Oral Oral Oral  SpO2: 99% 97% 100% 99%  Weight:       Body mass index is 24.42 kg/m.  Physical Exam Constitutional:      Comments: He was asleep in bed but aroused easily.  He is in no distress.  He has a half eaten breakfast in front of him  Cardiovascular:     Rate and Rhythm: Normal rate and regular rhythm.     Heart sounds: No murmur.  Pulmonary:     Effort: Pulmonary effort is normal.     Breath sounds: Normal breath sounds.  Abdominal:     Palpations: Abdomen is soft.     Tenderness: There is no abdominal tenderness.     Lab Results Lab Results  Component Value Date   WBC 6.7 05/25/2019   HGB 8.6 (L) 05/25/2019   HCT 27.3 (L) 05/25/2019   MCV 83.2 05/25/2019   PLT 249 05/25/2019    Lab Results  Component Value Date   CREATININE 0.81 05/25/2019   BUN  11 05/25/2019   NA 137 05/25/2019   K 3.4 (L) 05/25/2019   CL 111 05/25/2019   CO2 19 (L) 05/25/2019    Lab Results  Component Value Date   ALT 43 05/22/2019   AST 18 05/22/2019   ALKPHOS 118 05/22/2019   BILITOT 1.4 (H) 05/22/2019     Microbiology: Recent Results (from the past 240 hour(s))  Blood Culture (routine x 2)     Status: Abnormal   Collection Time: 05/22/19  1:35 PM   Specimen: BLOOD RIGHT ARM  Result Value Ref Range Status   Specimen Description BLOOD RIGHT ARM  Final   Special Requests   Final    BOTTLES DRAWN AEROBIC AND ANAEROBIC Blood Culture results may not be optimal due to an excessive volume of blood received in culture bottles   Culture  Setup Time   Final    GRAM POSITIVE COCCI IN BOTH AEROBIC AND ANAEROBIC BOTTLES CRITICAL RESULT CALLED TO, READ BACK BY AND VERIFIED WITH: RHRMD L SEAY @ 0604 05/23/19 BY S GEZAHEGN Performed at [facility], [place] [address] [place], [place] [address]    Culture ENTEROCOCCUS FAECALIS (A)  Final   Report Status 05/25/2019 FINAL  Final   Organism ID, Bacteria ENTEROCOCCUS FAECALIS  Final      Susceptibility   Enterococcus faecalis - MIC*    AMPICILLIN <=2 SENSITIVE Sensitive     VANCOMYCIN 1 SENSITIVE Sensitive     GENTAMICIN SYNERGY SENSITIVE Sensitive     * ENTEROCOCCUS FAECALIS  Blood Culture ID Panel (Reflexed)     Status: Abnormal   Collection Time: 05/22/19  1:35 PM  Result Value Ref Range Status   Enterococcus species DETECTED (A) NOT DETECTED Final    Comment: RHRMD L SEAY @ 0539 05/23/19 BY S GEZAHEGN   Vancomycin resistance NOT DETECTED NOT DETECTED Final   Listeria monocytogenes NOT DETECTED NOT DETECTED Final   Staphylococcus species NOT DETECTED NOT DETECTED Final   Staphylococcus aureus (BCID) NOT DETECTED NOT DETECTED Final   Streptococcus species NOT DETECTED NOT DETECTED Final   Streptococcus agalactiae NOT DETECTED NOT DETECTED Final   Streptococcus pneumoniae NOT DETECTED NOT DETECTED Final   Streptococcus pyogenes NOT DETECTED NOT DETECTED Final   Acinetobacter baumannii NOT DETECTED NOT DETECTED Final   Enterobacteriaceae species NOT DETECTED NOT DETECTED Final   Enterobacter cloacae complex NOT DETECTED NOT DETECTED Final   Escherichia coli NOT DETECTED NOT DETECTED Final   Klebsiella oxytoca NOT DETECTED NOT DETECTED Final   Klebsiella pneumoniae NOT DETECTED NOT DETECTED Final   Proteus species NOT DETECTED NOT DETECTED Final   Serratia marcescens NOT DETECTED NOT DETECTED Final   Haemophilus influenzae NOT DETECTED NOT DETECTED Final   Neisseria meningitidis NOT DETECTED NOT DETECTED Final   Pseudomonas aeruginosa NOT DETECTED NOT DETECTED Final   Candida albicans NOT DETECTED NOT DETECTED Final   Candida glabrata NOT DETECTED NOT DETECTED Final   Candida krusei NOT DETECTED NOT DETECTED Final   Candida parapsilosis NOT DETECTED NOT DETECTED Final   Candida  tropicalis NOT DETECTED NOT DETECTED Final    Comment: Performed at [facility], [place] [address], [place], [place] [address]  Blood Culture (routine x 2)     Status: Abnormal   Collection Time: 05/22/19  1:50 PM   Specimen: BLOOD RIGHT FOREARM  Result Value Ref Range Status   Specimen Description BLOOD RIGHT FOREARM  Final   Special Requests   Final    BOTTLES DRAWN AEROBIC  AND ANAEROBIC Blood Culture adequate volume   Culture  Setup Time   Final    GRAM POSITIVE COCCI IN BOTH AEROBIC AND ANAEROBIC BOTTLES CRITICAL VALUE NOTED.  VALUE IS CONSISTENT WITH PREVIOUSLY REPORTED AND CALLED VALUE.    Culture (A)  Final    ENTEROCOCCUS FAECALIS SUSCEPTIBILITIES PERFORMED ON PREVIOUS CULTURE WITHIN THE LAST 5 DAYS. Performed at [facility], [place] [address], [place], [place]  [address]    Report Status 05/25/2019 FINAL  Final  Urine culture     Status: Abnormal   Collection Time: 05/22/19  4:09 PM   Specimen: In/Out Cath Urine  Result Value Ref Range Status   Specimen Description IN/OUT CATH URINE  Final   Special Requests   Final    NONE Performed at [facility], [place] [address], [place], [place] [address]    Culture >=100,000 COLONIES/mL ENTEROCOCCUS FAECALIS (A)  Final   Report Status 05/24/2019 FINAL  Final   Organism ID, Bacteria ENTEROCOCCUS FAECALIS (A)  Final      Susceptibility   Enterococcus faecalis - MIC*    AMPICILLIN <=2 SENSITIVE Sensitive     LEVOFLOXACIN 2 SENSITIVE Sensitive     NITROFURANTOIN <=16 SENSITIVE Sensitive     VANCOMYCIN 1 SENSITIVE Sensitive     * >=100,000 COLONIES/mL ENTEROCOCCUS FAECALIS  SARS Coronavirus 2 (CEPHEID- Performed in [place] hospital lab), Hosp Order     Status: None   Collection Time: 05/22/19  6:02 PM   Specimen: Nasopharyngeal Swab  Result Value Ref Range Status   SARS Coronavirus 2 NEGATIVE NEGATIVE Final    Comment: (NOTE) If result is NEGATIVE SARS-CoV-2 target nucleic acids are NOT DETECTED. The SARS-CoV-2  RNA is generally detectable in upper and lower  respiratory specimens during the acute phase of infection. The lowest  concentration of SARS-CoV-2 viral copies this assay can detect is 250  copies / mL. A negative result does not preclude SARS-CoV-2 infection  and should not be used as the sole basis for treatment or other  patient management decisions.  A negative result may occur with  improper specimen collection / handling, submission of specimen other  than nasopharyngeal swab, presence of viral mutation(s) within the  areas targeted by this assay, and inadequate number of viral copies  (<250 copies / mL). A negative result must be combined with clinical  observations, patient history, and epidemiological information. If result is POSITIVE SARS-CoV-2 target nucleic acids are DETECTED. The SARS-CoV-2 RNA is generally detectable in upper and lower  respiratory specimens dur ing the acute phase of infection.  Positive  results are indicative of active infection with SARS-CoV-2.  Clinical  correlation with patient history and other diagnostic information is  necessary to determine patient infection status.  Positive results do  not rule out bacterial infection or co-infection with other viruses. If result is PRESUMPTIVE POSTIVE SARS-CoV-2 nucleic acids MAY BE PRESENT.   A presumptive positive result was obtained on the submitted specimen  and confirmed on repeat testing.  While 2019 novel coronavirus  (SARS-CoV-2) nucleic acids may be present in the submitted sample  additional confirmatory testing may be necessary for epidemiological  and / or clinical management purposes  to differentiate between  SARS-CoV-2 and other Sarbecovirus currently known to infect humans.  If clinically indicated additional testing with an alternate test  methodology [phone]) is advised. The SARS-CoV-2 RNA is generally  detectable in upper and lower respiratory sp ecimens during the acute  phase of  infection. The  expected result is Negative. Fact Sheet for Patients:  StrictlyIdeas.no Fact Sheet for Healthcare Providers: BankingDealers.co.za This test is not yet approved or cleared by the [place] FDA and has been authorized for detection and/or diagnosis of SARS-CoV-2 by FDA under an Emergency Use Authorization (EUA).  This EUA will remain in effect (meaning this test can be used) for the duration of the COVID-19 declaration under Section 564(b)(1) of the Act, 21 U.S.C. section 360bbb-3(b)(1), unless the authorization is terminated or revoked sooner. Performed at [facility], [place] [address], [place], [place] [address]   Respiratory Panel by PCR     Status: None   Collection Time: 05/22/19  9:53 PM   Specimen: Nasopharyngeal Swab; Respiratory  Result Value Ref Range Status   Adenovirus NOT DETECTED NOT DETECTED Final   Coronavirus 229E NOT DETECTED NOT DETECTED Final    Comment: (NOTE) The Coronavirus on the Respiratory Panel, DOES NOT test for the novel  Coronavirus (2019 nCoV)    Coronavirus HKU1 NOT DETECTED NOT DETECTED Final   Coronavirus NL63 NOT DETECTED NOT DETECTED Final   Coronavirus OC43 NOT DETECTED NOT DETECTED Final   Metapneumovirus NOT DETECTED NOT DETECTED Final   Rhinovirus / Enterovirus NOT DETECTED NOT DETECTED Final   Influenza A NOT DETECTED NOT DETECTED Final   Influenza B NOT DETECTED NOT DETECTED Final   Parainfluenza Virus 1 NOT DETECTED NOT DETECTED Final   Parainfluenza Virus 2 NOT DETECTED NOT DETECTED Final   Parainfluenza Virus 3 NOT DETECTED NOT DETECTED Final   Parainfluenza Virus 4 NOT DETECTED NOT DETECTED Final   Respiratory Syncytial Virus NOT DETECTED NOT DETECTED Final   Bordetella pertussis NOT DETECTED NOT DETECTED Final   Chlamydophila pneumoniae NOT DETECTED NOT DETECTED Final   Mycoplasma pneumoniae NOT DETECTED NOT DETECTED Final    Comment: Performed at [facility], [place]. [address], [place], [place] [address]    Michel Bickers, [place] [facility] [phone] pager   [phone] cell 05/25/2019, 11:23 AM

## 2019-05-25 NOTE — Progress Notes (Signed)
S: no complaints, chart notes some oral intake O: BP 114/88   Pulse 99   Temp 97.9 F (36.6 C) (Oral)   Resp 12   Wt 81.7 kg   SpO2 100%   BMI 24.42 kg/m  Gen: NAD Neuro: demented, confabulating Abd: soft, NT, open wound with clean base  A/P S/P ex lap with lysis of adhesions 6/30. Having bowel movements, refusing most meds and food due to dementia -no current surgical issues -call for new abdominal issues

## 2019-05-25 NOTE — Progress Notes (Signed)
  Speech Language Pathology Treatment: Dysphagia  Patient Details Name: Russell Dillon MRN: [id number] DOB: 1946-03-05 Today's Date: 05/25/2019 Time: 1340-1405 SLP Time Calculation (min) (ACUTE ONLY): 25 min  Assessment / Plan / Recommendation Clinical Impression  Patient seen to address dysphagia goals with spouse present at bedside. As patient continues to refuse PO's in the moment (SLP noticed earlier this morning that he had eaten some of food (25% ) on meal tray. Session focused on education and discussion of patient's oral PO intake and swallow function with patient and spouse. SLP explained that the main issue at this time is that patient is not eating much at all, but his swallow function appears to be largely [facility]. She explained that patient has become very picky over recent years and usually eats two meals a day. She prepares his foods and knows what he will eat. SLP provided wife with a menu from hospital and she will circle items she knows patient likes. SLP advanced patient's diet from puree to Dys 3 mechanical soft which hopefully will improve intake as well.     HPI HPI: Russell Dillon is a 73 y.o. male with medical history significant of permanent atrial fibrillation, dementia, essential hypertension, HLD, diastolic congestive heart failure, aortic valve regurgitation, BPH, and recent small bowel obstruction with recent surgical intervention with lysis of adhesions who presents with chief complaint of progressive confusion and fever.  Patient with dementia, wife at bedside who gives majority of history of present illness.  Following recent hospitalization, was discharged on 05/20/2019 following small bowel obstruction.  Since returning home he has not had a bowel movement and very poor oral intake.  Last night, spouse reports progressive confusion with a fever of 102.3.  History of urinary tract infections in the past. Head CT 7/12 no acute findings; CXR 7/11 no acute findings       SLP Plan  Continue with current plan of care       Recommendations  Diet recommendations: Dysphagia 3 (mechanical soft);Thin liquid Liquids provided via: Cup;Straw Medication Administration: Whole meds with puree Supervision: Intermittent supervision to cue for compensatory strategies Compensations: Minimize environmental distractions;Slow rate;Small sips/bites Postural Changes and/or Swallow Maneuvers: Seated upright 90 degrees                Oral Care Recommendations: Oral care BID Follow up Recommendations: Skilled Nursing facility;24 hour supervision/assistance SLP Visit Diagnosis: Dysphagia, unspecified (R13.10) Plan: Continue with current plan of care       GO                Dannial Monarch 05/25/2019, 4:57 PM    Sonia Baller, MA, [facility] Pager: [phone]

## 2019-05-25 NOTE — Progress Notes (Signed)
Pt's wife at bedside. Updated POC. All questions/concerns addressed. Will continue to monitor.

## 2019-05-25 NOTE — Progress Notes (Addendum)
PROGRESS NOTE  Russell Dillon VOZ:[id number] DOB: 25-Mar-1946 DOA: 05/22/2019 PCP: Marton Redwood, MD  Brief History   73 yo M PMH chronic AF on AC, dementia A&Ox 1-2 at baseline, HTN, HLD, diastolic CHD, AVR, s/p ex lap for lysis of adhesions causing SBO (hospitalized at [facility] 6/24-05/20/2019) admitted 05/22/2019 for AMS and fever, T Max 104.22F.   Around 2200 RRT called for hypotension, 68/43, pale, cool, clammy, HR variable. He received additional 1L IVF. BP stabilized however pt remained lethargic, confused, with HR 130s-160s.   PCCM consulted for further management. The patietn initially required precedex and vasoactive drips. Blood cultures collected on 05/22/2019 admission have grown out Enterococcus faecalis. Urine cultures are positive for the same.  The patient was transferred out of the ICU on 05/24/2019. The patient has been agitated and oppositional to taking his medications over the last 24 hours.  Consultants  . Surgery . PCCM . Infectious disease  Procedures  . None  Antibiotics   Anti-infectives (From admission, onward)   Start     Dose/Rate Route Frequency Ordered Stop   05/25/19 1400  amoxicillin (AMOXIL) capsule 500 mg     500 mg Oral Every 8 hours 05/25/19 1127 05/28/19 1359   05/23/19 1000  Ampicillin-Sulbactam (UNASYN) 3 g in sodium chloride 0.9 % 100 mL IVPB  Status:  Discontinued     3 g 200 mL/hr over 30 Minutes Intravenous Every 6 hours 05/23/19 0843 05/25/19 1127   05/23/19 0800  ampicillin (OMNIPEN) 2 g in sodium chloride 0.9 % 100 mL IVPB  Status:  Discontinued     2 g 300 mL/hr over 20 Minutes Intravenous Every 4 hours 05/23/19 0610 05/23/19 0843   05/23/19 0400  vancomycin (VANCOCIN) IVPB 1000 mg/200 mL premix  Status:  Discontinued     1,000 mg 200 mL/hr over 60 Minutes Intravenous Every 12 hours 05/22/19 1813 05/23/19 0843   05/22/19 2200  ampicillin (OMNIPEN) 2 g in sodium chloride 0.9 % 100 mL IVPB     2 g 300 mL/hr over 20 Minutes Intravenous   Once 05/22/19 2153 05/23/19 1456   05/22/19 2200  acyclovir (ZOVIRAX) 795 mg in dextrose 5 % 150 mL IVPB     10 mg/kg  79.3 kg 165.9 mL/hr over 60 Minutes Intravenous  Once 05/22/19 2153 05/23/19 1459   05/22/19 2200  metroNIDAZOLE (FLAGYL) IVPB 500 mg  Status:  Discontinued     500 mg 100 mL/hr over 60 Minutes Intravenous Every 8 hours 05/22/19 2153 05/23/19 0843   05/22/19 2200  ceFEPIme (MAXIPIME) 2 g in sodium chloride 0.9 % 100 mL IVPB  Status:  Discontinued     2 g 200 mL/hr over 30 Minutes Intravenous Every 8 hours 05/22/19 1813 05/23/19 0843   05/22/19 1900  acyclovir (ZOVIRAX) 795 mg in dextrose 5 % 150 mL IVPB  Status:  Discontinued     10 mg/kg  79.3 kg 165.9 mL/hr over 60 Minutes Intravenous Every 8 hours 05/22/19 1813 05/23/19 0843   05/22/19 1900  ampicillin (OMNIPEN) 2 g in sodium chloride 0.9 % 100 mL IVPB  Status:  Discontinued     2 g 300 mL/hr over 20 Minutes Intravenous Every 6 hours 05/22/19 1813 05/23/19 0610   05/22/19 1400  vancomycin (VANCOCIN) 1,500 mg in sodium chloride 0.9 % 500 mL IVPB     1,500 mg 250 mL/hr over 120 Minutes Intravenous  Once 05/22/19 1332 05/22/19 1824   05/22/19 1330  ceFEPIme (MAXIPIME) 2 g in sodium chloride 0.9 %  100 mL IVPB     2 g 200 mL/hr over 30 Minutes Intravenous  Once 05/22/19 1321 05/22/19 1515   05/22/19 1330  metroNIDAZOLE (FLAGYL) IVPB 500 mg     500 mg 100 mL/hr over 60 Minutes Intravenous  Once 05/22/19 1321 05/22/19 1516   05/22/19 1330  vancomycin (VANCOCIN) IVPB 1000 mg/200 mL premix  Status:  Discontinued     1,000 mg 200 mL/hr over 60 Minutes Intravenous  Once 05/22/19 1321 05/22/19 1332    .  Marland Kitchen   Subjective  The patient is somewhat confused. He is awake and alert. No new complaints.  Objective   Vitals:  Vitals:   05/25/19 1252 05/25/19 1652  BP: 109/78 100/72  Pulse:  96  Resp: 12 12  Temp:    SpO2: 98% 95%    Exam:  Constitutional:  . The patient is awake, alert, but confused. No acute  distress. Marland Kitchen  Respiratory:  . No increased work of breathing. . No wheezes, rales, or rhonchi . No tactile fremitus. Cardiovascular:  . Regular rate and rhythm . No murmurs, ectopy, or gallups . No lateral PMI. No thrills. Abdomen:  . Abdomen is soft, non-tender, non-distended. . No hernias, masses, or organomegaly is appreciated. . Normoactive bowel sounds. Musculoskeletal:  . No cyanosis, clubbing, or edema Skin:  . No rashes, lesions, ulcers . palpation of skin: no induration or nodules Neurologic:  . Patient is unable to cooperate with exam. Psychiatric:  . Patient is confused, and not able to cooperate with exam.   I have personally reviewed the following:   Today's Data  . BMP, CBC,Vitals.   Micro Data  . Blood cultures . Urine Cultures .   Scheduled Meds: . amoxicillin  500 mg Oral Q8H  . Chlorhexidine Gluconate Cloth  6 each Topical Daily  . colchicine  0.6 mg Oral BID  . diltiazem  30 mg Oral Q6H  . docusate sodium  100 mg Oral BID  . donepezil  10 mg Oral QHS  . famotidine  20 mg Oral BID  . finasteride  5 mg Oral q morning - 10a  . lithium carbonate  150 mg Oral QHS  . mouth rinse  15 mL Mouth Rinse BID  . polyethylene glycol  17 g Oral Daily  . potassium chloride  40 mEq Oral Once  . sodium chloride flush  3 mL Intravenous Once  . warfarin  3 mg Oral ONCE-1800  . Warfarin - Pharmacist Dosing Inpatient   Does not apply q1800   Continuous Infusions: . sodium chloride    . lactated ringers    . lactated ringers 75 mL/hr at 05/25/19 0455    Principal Problem:   Bacteremia due to Enterococcus Active Problems:   Persistent atrial fibrillation with RVR   Chronic diastolic CHF (congestive heart failure) (HCC)   Urinary tract infection   LOS: 3 days   A & P   Enterococcus UTI and bacteremia: IV bactrim. Appreciate ID's assistance.  Adynamic Ileus: CT abdomen with dilated small bowel without defined transition point.  Felt to reflect adynamic  ileus but also consider partial small bowel obstruction.  No other acute findings within the abdomen and pelvis. I appreciate surgery's assistance. Pt now on a pureed diet and bowel regimen. Wet to dry dressings on the abdominal wound.  Mild relative adrenal insufficiency: Serum cortisol 17.8, would expect this to be higher in the setting of acute critical illness and sepsis however we are weaning vasoactive drips. No need  for stress dose steroids as he has remained hemodynamically stable. Would avoid steroids as they will impair healing.  Acute metabolic encephalopathy c/b dementia, suspect secondary to sepsis: CT of brain was negative for acute infarct, hemorrhage, hydrocephalus or other findings to explain current mental status change. The patient has been off of precedex for a day. He has been agitation and oppositional particularly when it comes to taking his medications. It may help to have his wife at bedside.  Hypokalemia: Replace potassium and monitor.  Chronic AF with RVR: Restart diltiazem. Monitor on telemetry. Coumadin restarted. Rate controlled on amiodarone.  Anemia: Monitor hemoglobin. Currently appears stable, slight drop as expected with volume resuscitation efforts. There is no evidence of bleeding.  I have seen and examined this patient myself. I have spent 40 minutes in his evaluation and care.  DVT Prophylaxis: Coumadin Family Communication: None at bedside Disposition: Home  Russell Walrond, DO [facility] Direct contact: see [email or web address]  7PM-7AM contact night coverage as above 05/25/2019, 6:27 PM  LOS: 3 days

## 2019-05-25 NOTE — Progress Notes (Signed)
Patient had difficulty following direction and command to take pill with applesauce. Patient able to take pills more readily/easily unassisted from medicine cup followed by sips of water. Patient expressed desire to administer meds to himself from cup. RN observed patient take meds, sitting upright with no coughing or need to clear throat after taking meds with water.  Patient stated he did not like applesauce. Patient progressively more oriented to situation and place during shift.

## 2019-05-25 NOTE — Progress Notes (Signed)
ANTICOAGULATION CONSULT NOTE - Follow Up Consult  Pharmacy Consult for Warfarin Indication: atrial fibrillation  No Known Allergies  Patient Measurements: Height: 6' (182.9 cm) Weight: 180 lb 1.9 oz (81.7 kg) IBW/kg (Calculated) : 77.6  Vital Signs: Temp: 97.6 F (36.4 C) (07/14 0930) Temp Source: Oral (07/14 0930) BP: 109/66 (07/14 0930) Pulse Rate: 87 (07/14 0930)  Labs: Recent Labs    05/22/19 1340 05/23/19 0145  05/23/19 1346 05/24/19 0259 05/24/19 0648 05/25/19 0050  HGB 10.7* 9.3*  --   --  7.7* 7.8* 8.6*  HCT 33.8* 29.1*  --   --  24.7* 24.8* 27.3*  PLT 310 234  --   --  221 216 249  APTT 45*  --   --   --   --   --   --   LABPROT 16.9* 20.9*  --   --  22.4*  --  20.9*  INR 1.4* 1.8*  --   --  2.0*  --  1.8*  CREATININE 1.01 1.00   < > 0.87 0.85  --  0.81   < > = values in this interval not displayed.    Estimated Creatinine Clearance: 87.8 mL/min (by C-G formula based on SCr of 0.81 mg/dL).  Assessment:  73 yr old male on Warfarin 2.5 mg daily PTA for atrial fibrillation.  Admitted 7/11 with AMS and fever.  Recent SBO and exploratory lap 05/11/19.  Warfarin initially held, resumed 7/13 pm.     INR 1.4 on admit, but rose to 2.0 on 7/13 despite holding Warfarin.  Warfarin resumed on 7/13 with 2.5 mg x 1, INR 1.8 today.  Subtherapeutic.  Refusing some meds and food, but Warfarin dose given last evening.  Goal of Therapy:  INR 2-3 Monitor platelets by anticoagulation protocol: Yes   Plan:   Warfarin 3 mg x 1 today.  Daily PT/INR.  Arty Baumgartner,  Pager: [phone] or phone: [phone] 05/25/2019,12:47 PM

## 2019-05-26 ENCOUNTER — Other Ambulatory Visit: Payer: Self-pay

## 2019-05-26 LAB — CBC WITH DIFFERENTIAL/PLATELET
Abs Immature Granulocytes: 0.02 10*3/uL (ref 0.00–0.07)
Basophils Absolute: 0 10*3/uL (ref 0.0–0.1)
Basophils Relative: 0 %
Eosinophils Absolute: 0.3 10*3/uL (ref 0.0–0.5)
Eosinophils Relative: 6 %
HCT: 28.3 % — ABNORMAL LOW (ref 39.0–52.0)
Hemoglobin: 8.9 g/dL — ABNORMAL LOW (ref 13.0–17.0)
Immature Granulocytes: 0 %
Lymphocytes Relative: 16 %
Lymphs Abs: 0.9 10*3/uL (ref 0.7–4.0)
MCH: 26.2 pg (ref 26.0–34.0)
MCHC: 31.4 g/dL (ref 30.0–36.0)
MCV: 83.2 fL (ref 80.0–100.0)
Monocytes Absolute: 0.3 10*3/uL (ref 0.1–1.0)
Monocytes Relative: 5 %
Neutro Abs: 4.1 10*3/uL (ref 1.7–7.7)
Neutrophils Relative %: 73 %
Platelets: 279 10*3/uL (ref 150–400)
RBC: 3.4 MIL/uL — ABNORMAL LOW (ref 4.22–5.81)
RDW: 20.9 % — ABNORMAL HIGH (ref 11.5–15.5)
WBC: 5.7 10*3/uL (ref 4.0–10.5)
nRBC: 0 % (ref 0.0–0.2)

## 2019-05-26 LAB — COMPREHENSIVE METABOLIC PANEL
ALT: 21 U/L (ref 0–44)
AST: 12 U/L — ABNORMAL LOW (ref 15–41)
Albumin: 2.1 g/dL — ABNORMAL LOW (ref 3.5–5.0)
Alkaline Phosphatase: 70 U/L (ref 38–126)
Anion gap: 10 (ref 5–15)
BUN: 8 mg/dL (ref 8–23)
CO2: 21 mmol/L — ABNORMAL LOW (ref 22–32)
Calcium: 8.2 mg/dL — ABNORMAL LOW (ref 8.9–10.3)
Chloride: 108 mmol/L (ref 98–111)
Creatinine, Ser: 0.7 mg/dL (ref 0.61–1.24)
GFR calc Af Amer: >60 mL/min (ref >60)
GFR calc non Af Amer: >60 mL/min (ref >60)
Glucose, Bld: 88 mg/dL (ref 70–99)
Potassium: 3 mmol/L — ABNORMAL LOW (ref 3.5–5.1)
Sodium: 139 mmol/L (ref 135–145)
Total Bilirubin: 0.4 mg/dL (ref 0.3–1.2)
Total Protein: 5.8 g/dL — ABNORMAL LOW (ref 6.5–8.1)

## 2019-05-26 LAB — PROTIME-INR
INR: 2.1 — ABNORMAL HIGH (ref 0.8–1.2)
Prothrombin Time: 22.8 seconds — ABNORMAL HIGH (ref 11.4–15.2)

## 2019-05-26 MED ORDER — POTASSIUM CHLORIDE 10 MEQ/100ML IV SOLN
10.0000 meq | INTRAVENOUS | Status: AC
  Administered 2019-05-26 (×4): 10 meq via INTRAVENOUS
  Filled 2019-05-26 (×4): qty 100

## 2019-05-26 MED ORDER — LACTULOSE 10 GM/15ML PO SOLN
20.0000 g | Freq: Three times a day (TID) | ORAL | Status: DC
  Administered 2019-05-26 – 2019-05-28 (×4): 20 g via ORAL
  Filled 2019-05-26 (×7): qty 30

## 2019-05-26 MED ORDER — WARFARIN SODIUM 2.5 MG PO TABS
2.5000 mg | ORAL_TABLET | Freq: Once | ORAL | Status: AC
  Administered 2019-05-26: 2.5 mg via ORAL
  Filled 2019-05-26: qty 1

## 2019-05-26 NOTE — Progress Notes (Signed)
ANTICOAGULATION CONSULT NOTE - Follow Up Consult  Pharmacy Consult for Warfarin Indication: atrial fibrillation  No Known Allergies  Patient Measurements: Height: 6' (182.9 cm) Weight: 188 lb 15 oz (85.7 kg) IBW/kg (Calculated) : 77.6  Vital Signs: Temp: 97.6 F (36.4 C) (07/15 0840) Temp Source: Oral (07/15 0840) BP: 112/69 (07/15 1240) Pulse Rate: 94 (07/15 0618)  Labs: Recent Labs    05/24/19 0259 05/24/19 0648 05/25/19 0050 05/26/19 0216  HGB 7.7* 7.8* 8.6* 8.9*  HCT 24.7* 24.8* 27.3* 28.3*  PLT 221 216 249 279  LABPROT 22.4*  --  20.9* 22.8*  INR 2.0*  --  1.8* 2.1*  CREATININE 0.85  --  0.81 0.70    Estimated Creatinine Clearance: 88.9 mL/min (by C-G formula based on SCr of 0.7 mg/dL).  Assessment:  73 yr old male on Warfarin 2.5 mg daily PTA for atrial fibrillation.  Admitted 7/11 with AMS and fever.  Recent SBO and exploratory lap 05/11/19.  Warfarin initially held, resumed 7/13 pm.     INR 1.4 on admit, but rose to 2.0 on 7/13 despite holding Warfarin.  Warfarin resumed on 7/13.  INR subtherapeutic (1.8)  on 7/14 but back into therapeutic range today (2.1) after slightly increased dose of 3 mg on 7/14.    Goal of Therapy:  INR 2-3 Monitor platelets by anticoagulation protocol: Yes   Plan:   Warfarin 2.5 mg x 1 today, usual dose.  Daily PT/INR.  Arty Baumgartner, [place] Pager: [phone] or phone: [phone] 05/26/2019,12:58 PM

## 2019-05-26 NOTE — Addendum Note (Signed)
Addended by: Oswaldo Done on: 07/04/2352 09:31 AM   Modules accepted: Level of Service, SmartSet

## 2019-05-26 NOTE — Progress Notes (Signed)
Occupational Therapy Treatment Patient Details Name: Russell Dillon MRN: [id number] DOB: 1946-04-17 Today's Date: 05/26/2019    History of present illness Pt is a 73 y/o male admitted secondary to worsening abdominal pain. Pt found to have SBO and is s/p exploratory laparotomy. Pt also with sepsis secondary to UTI. Pt with ICU stay following hypotension. REcent d/c from hospital ~one week ago. PMH includes a fib, dementia, HTN, and L patellar tendon repair. PMH includes a fib, dementia, HTN, and L patellar tendon repair   OT comments  Pt supine in bed with wife present in the room. Wife reports pt has not stood with RW or taken any steps in ~4 months. Pt utilized slide board for transfer prior to this hospitalization. Pt very agitated when asked to comply or do any type of activity. Caregiver was helpful in attempting to motivate pt. He did do therapeutic exercise with green resistive theraband on L UE but refusing once attempting on R UE. OT demonstrated exercises to caregiver and she plans on attempting them with him later.    Follow Up Recommendations  Home health OT;Supervision/Assistance - 24 hour;SNF    Equipment Recommendations  None recommended by OT    Recommendations for Other Services      Precautions / Restrictions Precautions Precautions: Fall Precaution Comments: L knee sensitive to touch Restrictions Weight Bearing Restrictions: No       Mobility Bed Mobility    General bed mobility comments: declined all mobility         ADL either performed or assessed with clinical judgement        Vision Baseline Vision/History: Wears glasses Wears Glasses: At all times            Cognition Arousal/Alertness: Awake/alert Behavior During Therapy: Flat affect;Agitated Overall Cognitive Status: History of cognitive impairments - at baseline                       Pertinent Vitals/ Pain       Pain Assessment: Faces Faces Pain Scale: No hurt          Frequency  Min 1X/week        Progress Toward Goals  OT Goals(current goals can now be found in the care plan section)  Progress towards OT goals: Progressing toward goals  Acute Rehab OT Goals Patient Stated Goal: to go home OT Goal Formulation: With patient/family Potential to Achieve Goals: [place] Discharge plan remains appropriate       AM-PAC OT "6 Clicks" Daily Activity     Outcome Measure   Help from another person eating meals?: A Little Help from another person taking care of personal grooming?: Total Help from another person toileting, which includes using toliet, bedpan, or urinal?: Total Help from another person bathing (including washing, rinsing, drying)?: Total Help from another person to put on and taking off regular upper body clothing?: Total Help from another person to put on and taking off regular lower body clothing?: Total 6 Click Score: 8    End of Session    OT Visit Diagnosis: Muscle weakness (generalized) (M62.81);Other symptoms and signs involving cognitive function   Activity Tolerance Treatment limited secondary to agitation   Patient Left in chair;with call bell/phone within reach;with family/visitor present   Nurse Communication Mobility status        Time: 4235-3614 OT Time Calculation (min): 14 min  Charges: OT General Charges $OT Visit: 1 Visit OT Treatments $Therapeutic Exercise: 8-22 mins  [name], MS, OTR/L 05/26/2019, 2:59 PM

## 2019-05-26 NOTE — Progress Notes (Signed)
This encounter was created in error - please disregard.

## 2019-05-26 NOTE — Patient Outreach (Signed)
Patient triggered RED on EMMI General Discharge for follow-up.   Notification sent to [place].   Russell Dillon [facility]

## 2019-05-26 NOTE — Care Management Important Message (Signed)
Important Message  Patient Details  Name: Russell Dillon MRN: [id number] Date of Birth: 1946/05/07   Medicare Important Message Given:  Yes     [name] [address] 05/26/2019, 2:15 PM

## 2019-05-26 NOTE — Progress Notes (Signed)
SLP Cancellation Note  Patient Details Name: Russell Dillon MRN: [id number] DOB: 12/21/1945   Cancelled treatment:       Reason Eval/Treat Not Completed: Patient declined, no reason specified. Patient continues to refuse/decline PO's but per RN, he did eat some breakfast. He seems to do better when he has meal tray set up for him but not directly supervised. Will continue to follow.   Nadara Mode [name] 05/26/2019, 4:21 PM   Sonia Baller, MA, CCC-SLP Speech Therapy [place] [facility] Pager: [phone]

## 2019-05-26 NOTE — Addendum Note (Signed)
Addended by: Oswaldo Done on: 7/40/8144 09:32 AM   Modules accepted: Level of Service, SmartSet

## 2019-05-26 NOTE — Progress Notes (Signed)
PROGRESS NOTE  Russell Dillon BOF:[id number] DOB: 1946/10/31 DOA: 05/22/2019 PCP: Marton Redwood, MD  Brief History   73 yo M PMH chronic AF on AC, dementia A&Ox 1-2 at baseline, HTN, HLD, diastolic CHD, AVR, s/p ex lap for lysis of adhesions causing SBO (hospitalized at [facility] 6/24-05/20/2019) admitted 05/22/2019 for AMS and fever, T Max 104.33F.   Around 2200 RRT called for hypotension, 68/43, pale, cool, clammy, HR variable. He received additional 1L IVF. BP stabilized however pt remained lethargic, confused, with HR 130s-160s.   PCCM consulted for further management. The patietn initially required precedex and vasoactive drips. Blood cultures collected on 05/22/2019 admission have grown out Enterococcus faecalis. Urine cultures are positive for the same. Surveillance cultures have had no growth.  The patient was transferred out of the ICU on 05/24/2019. The patient has been agitated and oppositional to taking his medications. He was a little better with his wife at bedside yesterday.  Consultants  . Surgery . PCCM . Infectious disease  Procedures  . None  Antibiotics   Anti-infectives (From admission, onward)   Start     Dose/Rate Route Frequency Ordered Stop   05/25/19 1400  amoxicillin (AMOXIL) capsule 500 mg     500 mg Oral Every 8 hours 05/25/19 1127 05/28/19 1359   05/23/19 1000  Ampicillin-Sulbactam (UNASYN) 3 g in sodium chloride 0.9 % 100 mL IVPB  Status:  Discontinued     3 g 200 mL/hr over 30 Minutes Intravenous Every 6 hours 05/23/19 0843 05/25/19 1127   05/23/19 0800  ampicillin (OMNIPEN) 2 g in sodium chloride 0.9 % 100 mL IVPB  Status:  Discontinued     2 g 300 mL/hr over 20 Minutes Intravenous Every 4 hours 05/23/19 0610 05/23/19 0843   05/23/19 0400  vancomycin (VANCOCIN) IVPB 1000 mg/200 mL premix  Status:  Discontinued     1,000 mg 200 mL/hr over 60 Minutes Intravenous Every 12 hours 05/22/19 1813 05/23/19 0843   05/22/19 2200  ampicillin (OMNIPEN) 2 g in sodium  chloride 0.9 % 100 mL IVPB     2 g 300 mL/hr over 20 Minutes Intravenous  Once 05/22/19 2153 05/23/19 1456   05/22/19 2200  acyclovir (ZOVIRAX) 795 mg in dextrose 5 % 150 mL IVPB     10 mg/kg  79.3 kg 165.9 mL/hr over 60 Minutes Intravenous  Once 05/22/19 2153 05/23/19 1459   05/22/19 2200  metroNIDAZOLE (FLAGYL) IVPB 500 mg  Status:  Discontinued     500 mg 100 mL/hr over 60 Minutes Intravenous Every 8 hours 05/22/19 2153 05/23/19 0843   05/22/19 2200  ceFEPIme (MAXIPIME) 2 g in sodium chloride 0.9 % 100 mL IVPB  Status:  Discontinued     2 g 200 mL/hr over 30 Minutes Intravenous Every 8 hours 05/22/19 1813 05/23/19 0843   05/22/19 1900  acyclovir (ZOVIRAX) 795 mg in dextrose 5 % 150 mL IVPB  Status:  Discontinued     10 mg/kg  79.3 kg 165.9 mL/hr over 60 Minutes Intravenous Every 8 hours 05/22/19 1813 05/23/19 0843   05/22/19 1900  ampicillin (OMNIPEN) 2 g in sodium chloride 0.9 % 100 mL IVPB  Status:  Discontinued     2 g 300 mL/hr over 20 Minutes Intravenous Every 6 hours 05/22/19 1813 05/23/19 0610   05/22/19 1400  vancomycin (VANCOCIN) 1,500 mg in sodium chloride 0.9 % 500 mL IVPB     1,500 mg 250 mL/hr over 120 Minutes Intravenous  Once 05/22/19 1332 05/22/19 1824  05/22/19 1330  ceFEPIme (MAXIPIME) 2 g in sodium chloride 0.9 % 100 mL IVPB     2 g 200 mL/hr over 30 Minutes Intravenous  Once 05/22/19 1321 05/22/19 1515   05/22/19 1330  metroNIDAZOLE (FLAGYL) IVPB 500 mg     500 mg 100 mL/hr over 60 Minutes Intravenous  Once 05/22/19 1321 05/22/19 1516   05/22/19 1330  vancomycin (VANCOCIN) IVPB 1000 mg/200 mL premix  Status:  Discontinued     1,000 mg 200 mL/hr over 60 Minutes Intravenous  Once 05/22/19 1321 05/22/19 1332     .   Subjective  The patient is somewhat confused. He is awake and alert. No new complaints.  Objective   Vitals:  Vitals:   05/26/19 0840 05/26/19 1240  BP: 114/61 112/69  Pulse:    Resp: 15 12  Temp: 97.6 F (36.4 C)   SpO2: 100%      Exam:  Constitutional:  . The patient is awake, alert, but confused. No acute distress. Marland Kitchen  Respiratory:  . No increased work of breathing. . No wheezes, rales, or rhonchi . No tactile fremitus. Cardiovascular:  . Regular rate and rhythm . No murmurs, ectopy, or gallups . No lateral PMI. No thrills. Abdomen:  . Abdomen is soft, non-tender, non-distended. . No hernias, masses, or organomegaly is appreciated. . Normoactive bowel sounds. Musculoskeletal:  . No cyanosis, clubbing, or edema Skin:  . No rashes, lesions, ulcers . palpation of skin: no induration or nodules Neurologic:  . Patient is unable to cooperate with exam. Psychiatric:  . Patient is confused, and not able to cooperate with exam.   I have personally reviewed the following:   Today's Data  . BMP, CBC,Vitals.   Micro Data  . Blood cultures . Urine Cultures .   Scheduled Meds: . amoxicillin  500 mg Oral Q8H  . Chlorhexidine Gluconate Cloth  6 each Topical Daily  . colchicine  0.6 mg Oral BID  . diltiazem  30 mg Oral Q6H  . docusate sodium  100 mg Oral BID  . donepezil  10 mg Oral QHS  . famotidine  20 mg Oral BID  . finasteride  5 mg Oral q morning - 10a  . lithium carbonate  150 mg Oral QHS  . mouth rinse  15 mL Mouth Rinse BID  . polyethylene glycol  17 g Oral Daily  . sodium chloride flush  3 mL Intravenous Once  . warfarin  2.5 mg Oral ONCE-1800  . Warfarin - Pharmacist Dosing Inpatient   Does not apply q1800   Continuous Infusions: . sodium chloride    . lactated ringers 75 mL/hr at 05/26/19 1200  . potassium chloride 10 mEq (05/26/19 1157)    Principal Problem:   Bacteremia due to Enterococcus Active Problems:   Persistent atrial fibrillation with RVR   Chronic diastolic CHF (congestive heart failure) (HCC)   Urinary tract infection   LOS: 4 days   A & P   Enterococcus UTI and bacteremia: IV bactrim. Appreciate ID's assistance. Surveillance have had no growth.  Adynamic  Ileus: Improved. The patient is on a pureed diet, but is not eating much.  CT abdomen with dilated small bowel without defined transition point.  Felt to reflect adynamic ileus but also consider partial small bowel obstruction.  No other acute findings within the abdomen and pelvis. I appreciate surgery's assistance. Pt now on a pureed diet and bowel regimen. Wet to dry dressings on the abdominal wound.  Mild relative adrenal insufficiency:  Serum cortisol 17.8, would expect this to be higher in the setting of acute critical illness and sepsis however we are weaning vasoactive drips. No need for stress dose steroids as he has remained hemodynamically stable. Would avoid steroids as they will impair healing.  Acute metabolic encephalopathy c/b dementia, suspect secondary to sepsis: CT of brain was negative for acute infarct, hemorrhage, hydrocephalus or other findings to explain current mental status change. The patient has been off of precedex for a day. He has been agitation and oppositional particularly when it comes to taking his medications. It has helped to have his wife at bedside.  Hypokalemia: Replace potassium and monitor.  Chronic AF with RVR: Restart diltiazem. Monitor on telemetry. Coumadin restarted. Rate controlled on amiodarone.  Anemia: Monitor hemoglobin. Currently appears stable, slight drop as expected with volume resuscitation efforts. There is no evidence of bleeding.  I have seen and examined this patient myself. I have spent 45 minutes in his evaluation and care.  DVT Prophylaxis: Coumadin Family Communication: None at bedside Disposition: Home  Tamasha Laplante, DO [facility] Direct contact: see [email or web address]  7PM-7AM contact night coverage as above 05/26/2019, 1:10 PM  LOS: 3 days

## 2019-05-26 NOTE — TOC Initial Note (Signed)
Transition of Care [facility]) - Initial/Assessment Note    Patient Details  Name: Russell Dillon MRN: [id number] Date of Birth: May 16, 1946  Transition of Care [facility]) CM/SW Contact:    Benard Halsted, LCSW Phone Number: [phone] 05/26/2019, 4:33 PM  Clinical Narrative:                 CSW received consult regarding PT recommendation of SNF at discharge.  Patient's spouse is refusing SNF. She is still concerned about COVID in facilities and requests to take patient back home at discharge with a resumption of Home Health PT/OT/RN orders. She requests 24 hours notice prior to discharge so she can alert her extra home health company of the need to help get patient home since she cannot move him alone. CSW will notify [place] once patient is discharged. No further needs identified at this time.    Expected Discharge Plan: [place] Barriers to Discharge: Continued Medical Work up   Patient Goals and CMS Choice Patient states their goals for this hospitalization and ongoing recovery are:: Return home CMS Medicare.gov Compare Post Acute Care list provided to:: Patient Choice offered to / list presented to : Patient, Adult Children  Expected Discharge Plan and Services Expected Discharge Plan: [place] In-house Referral: Clinical Social Work Discharge Planning Services: CM Consult Post Acute Care Choice: [place] arrangements for the past 2 months: Single Family Home                 DME Arranged: Patient refused services         HH Arranged: OT, PT, RN, Refused SNF [place] Agency: [place] ([facility]) Date [place]: 05/26/19 Time [place]: [phone] Representative spoke with at [place]: Butch Penny  Prior Living Arrangements/Services Living arrangements for the past 2 months: [place] with:: Spouse, Adult Children Patient language and need for interpreter reviewed:: Yes Do you feel  safe going back to the place where you live?: Yes      Need for Family Participation in Patient Care: Yes (Comment) Care giver support system in place?: Yes (comment) Current home services: Home OT, Home PT, Home RN, DME Criminal Activity/Legal Involvement Pertinent to Current Situation/Hospitalization: No - Comment as needed  Activities of Daily Living Home Assistive Devices/Equipment: Wheelchair ADL Screening (condition at time of admission) Patient's cognitive ability adequate to safely complete daily activities?: No Is the patient deaf or have difficulty hearing?: No Does the patient have difficulty seeing, even when wearing glasses/contacts?: No Does the patient have difficulty concentrating, remembering, or making decisions?: Yes Patient able to express need for assistance with ADLs?: Yes Does the patient have difficulty dressing or bathing?: Yes Independently performs ADLs?: No Does the patient have difficulty walking or climbing stairs?: Yes Weakness of Legs: Both Weakness of Arms/Hands: None  Permission Sought/Granted Permission sought to share information with : Family Supports Permission granted to share information with : No  Share Information with NAME: [name]  Permission granted to share info w AGENCY: [facility]  Permission granted to share info w Relationship: Spouse  Permission granted to share info w Contact Information: [phone]  Emotional Assessment Appearance:: Appears stated age Attitude/Demeanor/Rapport: Unable to Assess Affect (typically observed): Unable to Assess Orientation: : Oriented to Self Alcohol / Substance Use: Not Applicable Psych Involvement: No (comment)  Admission diagnosis:  Atrial fibrillation with RVR (HCC) [I48.91] Fever, unspecified fever cause [R50.9] Urinary tract infection without hematuria, site unspecified [  N39.0] Sepsis ([place]) [A41.9] Patient Active Problem List   Diagnosis Date Noted  . Bacteremia due to Enterococcus 05/24/2019   . Urinary tract infection 05/24/2019  . Chronic diastolic CHF (congestive heart failure) ([place]) 05/22/2019  . Gout 05/22/2019  . Sepsis ([place]) 05/22/2019  . Protein-calorie malnutrition, severe 05/18/2019  . AKI (acute kidney injury) ([place]) 05/05/2019  . Transient hypotension 05/05/2019  . Sepsis, Gram negative ([place]) 03/18/2019  . Hypoalbuminemia   . Leukocytosis   . Hyperglycemia   . Persistent atrial fibrillation with RVR   . Acute on chronic anemia   . Rupture of left patellar tendon 02/15/2019  . Postoperative pain   . Bipolar 1 disorder ([place]) 02/14/2019  . Dementia without behavioral disturbance ([place]) 02/14/2019  . Patellar tendon rupture, left, initial encounter 02/10/2019  . SIRS (systemic inflammatory response syndrome) ([place]) 02/09/2019  . Osteomyelitis ([place]) 02/09/2019  . Acute blood loss anemia 02/09/2019  . Aortic valve regurgitation 10/16/2018  . Encounter for therapeutic drug monitoring 12/02/2013  . SBO (small bowel obstruction) ([place]) 07/08/2013  . Hyperlipidemia 12/20/2011  . HTN (hypertension) 06/04/2011  . Atrial fibrillation, chronic 03/01/2011  . Long term (current) use of anticoagulants 03/01/2011  . GERD 10/11/2009  . DIVERTICULOSIS OF COLON 10/11/2009   PCP:  Marton Redwood, MD Pharmacy:   [id number] - [place], [place] [address] [place] [address] Phone: [phone] Fax: [phone]     Social Determinants of Health (SDOH) Interventions    Readmission Risk Interventions Readmission Risk Prevention Plan 05/26/2019 03/22/2019 02/15/2019  Transportation Screening Complete Complete Complete  PCP or Specialist Appt within 5-7 Days - - Complete  PCP or Specialist Appt within 3-5 Days - Complete -  Home Care Screening - - Complete  Medication Review (RN CM) - - Complete  HRI or Home Care Consult - Complete -  Social Work Consult for [place] Planning/Counseling - Complete -  Palliative Care Screening - Not  Complete -  Palliative Care Screening Not Complete Comments - NA -  Medication Review Press photographer) Complete Complete -  PCP or Specialist appointment within 3-5 days of discharge Complete - -  [place] or Home Care Consult Complete - -  SW Recovery Care/Counseling Consult Complete - -  Palliative Care Screening Not Applicable - -  Skilled Nursing Facility Complete - -  Some recent data might be hidden

## 2019-05-26 NOTE — Consult Note (Signed)
   [facility] CM Inpatient Consult   05/26/2019  ANSAR SKODA 20-May-1946 [id number]   Patient screened and alert from [facility] follow up for extreme high risk score [40%] for unplanned readmission score  and for 5 hospitalizations and within the last 7 days.  Review of patient's medical record reveals patient is per MD's brief history notes today:  73 yo M PMH chronic AF on AC, dementia A&Ox 1-2 at baseline, HTN, HLD, diastolic CHD, AVR, s/p ex lap for lysis of adhesions causing SBO (hospitalized at [facility] 6/24-05/20/2019) admitted 05/22/2019 for AMS and fever, T Max 104.82F.   Primary Care Provider is  Marton Redwood, [place].  Patient had been followed by [facility] for home palliative program noted previously.  Plan:  Follow for disposition needs.     Please place a [facility] Care Management consult as appropriate and for questions contact:   Natividad Brood, RN BSN [facility] Liaison  [phone] business mobile phone Toll free office [phone]  Fax number: [phone] [email or web address]

## 2019-05-26 NOTE — Addendum Note (Signed)
Addended by: Oswaldo Done on: 5/78/4696 09:28 AM   Modules accepted: Level of Service, SmartSet

## 2019-05-27 LAB — BASIC METABOLIC PANEL
Anion gap: 11 (ref 5–15)
BUN: 5 mg/dL — ABNORMAL LOW (ref 8–23)
CO2: 24 mmol/L (ref 22–32)
Calcium: 8.5 mg/dL — ABNORMAL LOW (ref 8.9–10.3)
Chloride: 104 mmol/L (ref 98–111)
Creatinine, Ser: 0.77 mg/dL (ref 0.61–1.24)
GFR calc Af Amer: >60 mL/min (ref >60)
GFR calc non Af Amer: >60 mL/min (ref >60)
Glucose, Bld: 96 mg/dL (ref 70–99)
Potassium: 3.2 mmol/L — ABNORMAL LOW (ref 3.5–5.1)
Sodium: 139 mmol/L (ref 135–145)

## 2019-05-27 LAB — CBC WITH DIFFERENTIAL/PLATELET
Abs Immature Granulocytes: 0.02 10*3/uL (ref 0.00–0.07)
Basophils Absolute: 0 10*3/uL (ref 0.0–0.1)
Basophils Relative: 0 %
Eosinophils Absolute: 0.3 10*3/uL (ref 0.0–0.5)
Eosinophils Relative: 6 %
HCT: 30.2 % — ABNORMAL LOW (ref 39.0–52.0)
Hemoglobin: 9.4 g/dL — ABNORMAL LOW (ref 13.0–17.0)
Immature Granulocytes: 0 %
Lymphocytes Relative: 28 %
Lymphs Abs: 1.4 10*3/uL (ref 0.7–4.0)
MCH: 25.5 pg — ABNORMAL LOW (ref 26.0–34.0)
MCHC: 31.1 g/dL (ref 30.0–36.0)
MCV: 82.1 fL (ref 80.0–100.0)
Monocytes Absolute: 0.3 10*3/uL (ref 0.1–1.0)
Monocytes Relative: 7 %
Neutro Abs: 3 10*3/uL (ref 1.7–7.7)
Neutrophils Relative %: 59 %
Platelets: 306 10*3/uL (ref 150–400)
RBC: 3.68 MIL/uL — ABNORMAL LOW (ref 4.22–5.81)
RDW: 20.9 % — ABNORMAL HIGH (ref 11.5–15.5)
WBC: 5.1 10*3/uL (ref 4.0–10.5)
nRBC: 0 % (ref 0.0–0.2)

## 2019-05-27 LAB — PROTIME-INR
INR: 2.4 — ABNORMAL HIGH (ref 0.8–1.2)
Prothrombin Time: 25.6 seconds — ABNORMAL HIGH (ref 11.4–15.2)

## 2019-05-27 MED ORDER — MAGNESIUM SULFATE IN D5W 1-5 GM/100ML-% IV SOLN
1.0000 g | Freq: Once | INTRAVENOUS | Status: AC
  Administered 2019-05-27: 1 g via INTRAVENOUS
  Filled 2019-05-27: qty 100

## 2019-05-27 MED ORDER — POTASSIUM CHLORIDE CRYS ER 20 MEQ PO TBCR
40.0000 meq | EXTENDED_RELEASE_TABLET | ORAL | Status: AC
  Administered 2019-05-27 (×2): 40 meq via ORAL
  Filled 2019-05-27 (×2): qty 2

## 2019-05-27 MED ORDER — WARFARIN SODIUM 2.5 MG PO TABS
2.5000 mg | ORAL_TABLET | Freq: Once | ORAL | Status: AC
  Administered 2019-05-27: 2.5 mg via ORAL
  Filled 2019-05-27: qty 1

## 2019-05-27 NOTE — Progress Notes (Signed)
Physical Therapy Treatment Patient Details Name: Russell Dillon MRN: [id number] DOB: 1946-08-27 Today's Date: 05/27/2019    History of Present Illness Pt is a 73 y/o male admitted secondary to worsening abdominal pain. Pt found to have SBO and is s/p exploratory laparotomy. Pt also with sepsis secondary to UTI. Pt with ICU stay following hypotension. [place] d/c from hospital ~one week ago. PMH includes a fib, dementia, HTN, and L patellar tendon repair. PMH includes a fib, dementia, HTN, and L patellar tendon repair    PT Comments    Patient was able to do more then his initial eval. He sat edge of the bed for  Nearly 10 minutes before requesting to go back to bed. His wife does a good job keeping him from getting agitated. His wife plans to take him home.   Follow Up Recommendations  SNF     Equipment Recommendations  None recommended by PT    Recommendations for Other Services       Precautions / Restrictions Precautions Precautions: Fall Restrictions Weight Bearing Restrictions: No    Mobility  Bed Mobility Overal bed mobility: Needs Assistance Bed Mobility: Rolling;Sidelying to Sit     Supine to sit: Mod assist Sit to supine: Max assist   General bed mobility comments: mod a to scoot patient and to sit trunk up but patient able to transfer.   Transfers                 General transfer comment: declined to transfer  Ambulation/Gait                 Stairs             Wheelchair Mobility    Modified Rankin (Stroke Patients Only)       Balance Overall balance assessment: Needs assistance Sitting-balance support: Feet supported;Single extremity supported;Bilateral upper extremity supported Sitting balance-Leahy Scale: Fair Sitting balance - Comments: min a to min gaurding                                     Cognition Arousal/Alertness: Awake/alert Behavior During Therapy: Flat affect;Agitated Overall Cognitive  Status: History of cognitive impairments - at baseline                                 General Comments: became agitated at times but patients wife able to get patient back on tasks       Exercises      General Comments        Pertinent Vitals/Pain Pain Assessment: Faces Faces Pain Scale: Hurts a little bit Pain Location: abdomen  Pain Descriptors / Indicators: Grimacing;Guarding Pain Intervention(s): Limited activity within patient's tolerance;Monitored during session    Home Living                      Prior Function            PT Goals (current goals can now be found in the care plan section) Acute Rehab PT Goals Patient Stated Goal: to go home PT Goal Formulation: Patient unable to participate in goal setting Time For Goal Achievement: 06/07/19 Potential to Achieve Goals: Fair Progress towards PT goals: Progressing toward goals    Frequency    Min 2X/week      PT Plan Current plan remains appropriate  Co-evaluation              AM-PAC PT "6 Clicks" Mobility   Outcome Measure  Help needed turning from your back to your side while in a flat bed without using bedrails?: A Lot Help needed moving from lying on your back to sitting on the side of a flat bed without using bedrails?: A Lot Help needed moving to and from a bed to a chair (including a wheelchair)?: Total Help needed standing up from a chair using your arms (e.g., wheelchair or bedside chair)?: Total Help needed to walk in hospital room?: Total Help needed climbing 3-5 steps with a railing? : Total 6 Click Score: 8    End of Session Equipment Utilized During Treatment: Gait belt Activity Tolerance: Treatment limited secondary to agitation Patient left: in bed;with call bell/phone within reach;with bed alarm set;with family/visitor present   PT Visit Diagnosis: Muscle weakness (generalized) (M62.81);Difficulty in walking, not elsewhere classified  (R26.2);Unsteadiness on feet (R26.81)     Time: 4128-7867 PT Time Calculation (min) (ACUTE ONLY): 21 min  Charges:  $Therapeutic Activity: 8-22 mins                        Carney Living PT DPT  05/27/2019, 3:44 PM

## 2019-05-27 NOTE — Progress Notes (Signed)
ANTICOAGULATION CONSULT NOTE - Follow Up Consult  Pharmacy Consult for Warfarin Indication: atrial fibrillation  No Known Allergies  Patient Measurements: Height: 6' (182.9 cm) Weight: 196 lb 10.4 oz (89.2 kg) IBW/kg (Calculated) : 77.6  Vital Signs: Temp: 97.9 F (36.6 C) (07/16 0441) Temp Source: Oral (07/16 0441) BP: 114/71 (07/16 0444) Pulse Rate: 83 (07/16 0444)  Labs: Recent Labs    05/25/19 0050 05/26/19 0216 05/27/19 0232  HGB 8.6* 8.9* 9.4*  HCT 27.3* 28.3* 30.2*  PLT 249 279 306  LABPROT 20.9* 22.8* 25.6*  INR 1.8* 2.1* 2.4*  CREATININE 0.81 0.70 0.77    Estimated Creatinine Clearance: 88.9 mL/min (by C-G formula based on SCr of 0.77 mg/dL).  Assessment: 73 yr old male on Warfarin 2.5 mg daily PTA for atrial fibrillation. Admitted 7/11 with AMS and fever. Recent SBO and exploratory lap 05/11/19. Warfarin initially held, resumed 7/13 PM.  INR 1.4 on admit, but rose to 2.0 on 7/13 despite holding Warfarin. Warfarin resumed on 7/13. INR subtherapeutic (1.8) on 7/14 but back into therapeutic range today (2.4) after slightly increased dose of 3mg  on 7/14 and a normal dose of 2.5mg  on 7/15.   PTA Coumadin per patient: 2.5 mg daily, last dose PTA was on 7/10 Prior [facility] notes show 5mg  MWF, 2.5mg  TTSS, last note 03/10/19  Goal of Therapy:  INR 2-3 Monitor platelets by anticoagulation protocol: Yes   Plan:  Warfarin 2.5 mg x 1 today, usual dose Monitor daily PT/INR Monitor for signs and symptoms of bleeding   Kennon Holter, PharmD PGY1 Ambulatory Care Pharmacy Resident [employer] Phone: [phone] 05/27/2019,11:03 AM

## 2019-05-27 NOTE — Progress Notes (Signed)
PROGRESS NOTE  Russell Dillon VZD:[id number] DOB: 17-Jan-1946 DOA: 05/22/2019 PCP: Marton Redwood, MD  Brief History   73 yo M PMH chronic AF on AC, dementia A&Ox 1-2 at baseline, HTN, HLD, diastolic CHD, AVR, s/p ex lap for lysis of adhesions causing SBO (hospitalized at [facility] 6/24-05/20/2019) admitted 05/22/2019 for AMS and fever, T Max 104.49F.   Around 2200 RRT called for hypotension, 68/43, pale, cool, clammy, HR variable. He received additional 1L IVF. BP stabilized however pt remained lethargic, confused, with HR 130s-160s.   PCCM consulted for further management. The patietn initially required precedex and vasoactive drips. Blood cultures collected on 05/22/2019 admission have grown out Enterococcus faecalis. Urine cultures are positive for the same. Surveillance cultures have had no growth.  The patient was transferred out of the ICU on 05/24/2019. The patient has been agitated and oppositional to taking his medications. He was a little better with his wife at bedside yesterday.  Potassium of 3.2 noted.  Last magnesium check (on 05/25/2019) was 1.7.  Will replete potassium.  We will also give 1 g of magnesium.  Will check labs in the morning.  We will proceed discharge to skilled nursing facility when a bed is available.  Consultants  . Surgery . PCCM . Infectious disease  Procedures  . None  Antibiotics   Anti-infectives (From admission, onward)   Start     Dose/Rate Route Frequency Ordered Stop   05/25/19 1400  amoxicillin (AMOXIL) capsule 500 mg     500 mg Oral Every 8 hours 05/25/19 1127 05/28/19 1359   05/23/19 1000  Ampicillin-Sulbactam (UNASYN) 3 g in sodium chloride 0.9 % 100 mL IVPB  Status:  Discontinued     3 g 200 mL/hr over 30 Minutes Intravenous Every 6 hours 05/23/19 0843 05/25/19 1127   05/23/19 0800  ampicillin (OMNIPEN) 2 g in sodium chloride 0.9 % 100 mL IVPB  Status:  Discontinued     2 g 300 mL/hr over 20 Minutes Intravenous Every 4 hours 05/23/19 0610  05/23/19 0843   05/23/19 0400  vancomycin (VANCOCIN) IVPB 1000 mg/200 mL premix  Status:  Discontinued     1,000 mg 200 mL/hr over 60 Minutes Intravenous Every 12 hours 05/22/19 1813 05/23/19 0843   05/22/19 2200  ampicillin (OMNIPEN) 2 g in sodium chloride 0.9 % 100 mL IVPB     2 g 300 mL/hr over 20 Minutes Intravenous  Once 05/22/19 2153 05/23/19 1456   05/22/19 2200  acyclovir (ZOVIRAX) 795 mg in dextrose 5 % 150 mL IVPB     10 mg/kg  79.3 kg 165.9 mL/hr over 60 Minutes Intravenous  Once 05/22/19 2153 05/23/19 1459   05/22/19 2200  metroNIDAZOLE (FLAGYL) IVPB 500 mg  Status:  Discontinued     500 mg 100 mL/hr over 60 Minutes Intravenous Every 8 hours 05/22/19 2153 05/23/19 0843   05/22/19 2200  ceFEPIme (MAXIPIME) 2 g in sodium chloride 0.9 % 100 mL IVPB  Status:  Discontinued     2 g 200 mL/hr over 30 Minutes Intravenous Every 8 hours 05/22/19 1813 05/23/19 0843   05/22/19 1900  acyclovir (ZOVIRAX) 795 mg in dextrose 5 % 150 mL IVPB  Status:  Discontinued     10 mg/kg  79.3 kg 165.9 mL/hr over 60 Minutes Intravenous Every 8 hours 05/22/19 1813 05/23/19 0843   05/22/19 1900  ampicillin (OMNIPEN) 2 g in sodium chloride 0.9 % 100 mL IVPB  Status:  Discontinued     2 g 300 mL/hr over  20 Minutes Intravenous Every 6 hours 05/22/19 1813 05/23/19 0610   05/22/19 1400  vancomycin (VANCOCIN) 1,500 mg in sodium chloride 0.9 % 500 mL IVPB     1,500 mg 250 mL/hr over 120 Minutes Intravenous  Once 05/22/19 1332 05/22/19 1824   05/22/19 1330  ceFEPIme (MAXIPIME) 2 g in sodium chloride 0.9 % 100 mL IVPB     2 g 200 mL/hr over 30 Minutes Intravenous  Once 05/22/19 1321 05/22/19 1515   05/22/19 1330  metroNIDAZOLE (FLAGYL) IVPB 500 mg     500 mg 100 mL/hr over 60 Minutes Intravenous  Once 05/22/19 1321 05/22/19 1516   05/22/19 1330  vancomycin (VANCOCIN) IVPB 1000 mg/200 mL premix  Status:  Discontinued     1,000 mg 200 mL/hr over 60 Minutes Intravenous  Once 05/22/19 1321 05/22/19 1332     .    Subjective  The patient is somewhat confused. He is awake and alert. No new complaints.  Objective   Vitals:  Vitals:   05/27/19 0441 05/27/19 0444  BP:  114/71  Pulse:  83  Resp:  15  Temp: 97.9 F (36.6 C)   SpO2:  97%    Exam:  Constitutional:  . The patient is awake and alert.  No acute distress. Marland Kitchen  Respiratory:  . Clear to auscultation. Cardiovascular:  . S1-S2. Abdomen:  . Abdomen is soft, non-tender, non-distended. . Organs are not palpable. Musculoskeletal:  . No leg edema Neurologic:  Patient is awake and alert.  Moves all extremities.     Today's Data  . BMP, CBC,Vitals.   Micro Data  . Blood cultures . Urine Cultures .   Scheduled Meds: . amoxicillin  500 mg Oral Q8H  . Chlorhexidine Gluconate Cloth  6 each Topical Daily  . colchicine  0.6 mg Oral BID  . diltiazem  30 mg Oral Q6H  . docusate sodium  100 mg Oral BID  . donepezil  10 mg Oral QHS  . famotidine  20 mg Oral BID  . finasteride  5 mg Oral q morning - 10a  . lactulose  20 g Oral TID  . lithium carbonate  150 mg Oral QHS  . mouth rinse  15 mL Mouth Rinse BID  . polyethylene glycol  17 g Oral Daily  . sodium chloride flush  3 mL Intravenous Once  . warfarin  2.5 mg Oral ONCE-1800  . Warfarin - Pharmacist Dosing Inpatient   Does not apply q1800   Continuous Infusions: . sodium chloride    . lactated ringers 75 mL/hr at 05/27/19 1619    Principal Problem:   Bacteremia due to Enterococcus Active Problems:   Persistent atrial fibrillation with RVR   Chronic diastolic CHF (congestive heart failure) (HCC)   Urinary tract infection   LOS: 5 days   A & P   Enterococcus UTI and bacteremia:  IV bactrim. Appreciate ID's assistance. Surveillance have had no growth. 05/27/2019: ID team is directing antibiotics management.  Patient is currently on oral amoxicillin.  Adynamic Ileus: Improved.  The patient is on a pureed diet, but is not eating much.  CT abdomen with dilated small  bowel without defined transition point.  Felt to reflect adynamic ileus but also consider partial small bowel obstruction.  No other acute findings within the abdomen and pelvis. I appreciate surgery's assistance. Pt now on a pureed diet and bowel regimen. Wet to dry dressings on the abdominal wound.   Acute metabolic encephalopathy c/b dementia, suspect secondary to sepsis:  CT of brain was negative for acute infarct, hemorrhage, hydrocephalus or other findings to explain current mental status change. The patient has been off of precedex for a day. He has been agitation and oppositional particularly when it comes to taking his medications. It has helped to have his wife at bedside. 05/27/2019: Seems to be improved  Hypokalemia:  Replace potassium and monitor. Kindly see above  Chronic AF with RVR:  Restart diltiazem. Monitor on telemetry. Coumadin restarted. Rate controlled on amiodarone. Continue to monitor.  Anemia: Monitor hemoglobin. Currently appears stable, slight drop as expected with volume resuscitation efforts. There is no evidence of bleeding.  I have seen and examined this patient myself. I have spent 45 minutes in his evaluation and care.  DVT Prophylaxis: Coumadin Family Communication: None at bedside   Dana Allan, MD [facility] Direct contact: see [email or web address]  7PM-7AM contact night coverage as above

## 2019-05-28 LAB — RENAL FUNCTION PANEL
Albumin: 2.2 g/dL — ABNORMAL LOW (ref 3.5–5.0)
Anion gap: 7 (ref 5–15)
BUN: <5 mg/dL — ABNORMAL LOW (ref 8–23)
CO2: 24 mmol/L (ref 22–32)
Calcium: 8.3 mg/dL — ABNORMAL LOW (ref 8.9–10.3)
Chloride: 108 mmol/L (ref 98–111)
Creatinine, Ser: 0.72 mg/dL (ref 0.61–1.24)
GFR calc Af Amer: >60 mL/min (ref >60)
GFR calc non Af Amer: >60 mL/min (ref >60)
Glucose, Bld: 91 mg/dL (ref 70–99)
Phosphorus: 2.4 mg/dL — ABNORMAL LOW (ref 2.5–4.6)
Potassium: 3.6 mmol/L (ref 3.5–5.1)
Sodium: 139 mmol/L (ref 135–145)

## 2019-05-28 LAB — PROTIME-INR
INR: 2.5 — ABNORMAL HIGH (ref 0.8–1.2)
Prothrombin Time: 26.6 seconds — ABNORMAL HIGH (ref 11.4–15.2)

## 2019-05-28 LAB — MAGNESIUM: Magnesium: 1.7 mg/dL (ref 1.7–2.4)

## 2019-05-28 MED ORDER — LACTULOSE 10 GM/15ML PO SOLN: 20.0000 g | Freq: Three times a day (TID) | ORAL | 0 refills | Status: AC

## 2019-05-28 NOTE — Consult Note (Signed)
   [facility] CM Inpatient Consult   05/28/2019  GALDINO HINCHMAN 03-28-1946 [id number]  Follow up phone call made to patient's wife to contact for post discharge offering of [facility] care management services. HIPAA compliance standards followed for a voicemail message request for a return call was left on home.  Called wife's cell phone number at [phone].  Explained [facility] Management services for post hospital follow up needs.  Wife provided the appropriate HIPPA verification.  Wife states patient has [facility] and the last disposition home it took several days for the services to restart especially for his wound care needs.  Wife has concerns for getting his urine tested when symptoms first start to avoid hospitalizations.  Explained how [place] [facility] can follow up for transitional needs and working with his available resources.   She states they use [facility] for personal care services 3 times a day and will continue to get family [daughter's and patient's brother's) help in between the PCS times.  Primary Care Provider:  Dr. Marton Redwood confirmed with [facility].   Wife agrees to [place] [facility] for post hospital follow up for transition of care needs.  Contact information was given. [facility] Care Management will follow.  Wife states patient is suppose to go home tomorrow.   For questions, please contact:  Natividad Brood, RN BSN [facility] Liaison  [phone] business mobile phone Toll free office [phone]  Fax number: [phone] [email or web address]

## 2019-05-28 NOTE — Progress Notes (Signed)
I woke up patient.  He was a little angry and agitated.  He refused to have abdominal dressing changed.  The dressing that was on from yesterday afternoon was clean, dry and intact.

## 2019-05-28 NOTE — TOC Progression Note (Signed)
Transition of Care [facility]) - Progression Note    Patient Details  Name: Russell Dillon MRN: [id number] Date of Birth: 01/26/1946  Transition of Care [facility]) CM/SW [place], LCSW Phone Number: 05/28/2019, 10:28 AM  Clinical Narrative:    CSW alerted [place] that patient will be discharging home today.    Expected Discharge Plan: [place] Barriers to Discharge: Continued Medical Work up  Expected Discharge Plan and Services Expected Discharge Plan: [place] In-house Referral: Clinical Social Work Discharge Planning Services: CM Consult Post Acute Care Choice: [place] arrangements for the past 2 months: Single Family Home Expected Discharge Date: 05/28/19               DME Arranged: Patient refused services         HH Arranged: OT, PT, RN, Refused SNF [facility] Agency: [place] ([place]) Date [place]: 05/26/19 Time [place]: [phone] Representative spoke with at [place]: [place] ([place]) Interventions    Readmission Risk Interventions Readmission Risk Prevention Plan 05/26/2019 03/22/2019 02/15/2019  Transportation Screening Complete Complete Complete  PCP or Specialist Appt within 5-7 Days - - Complete  PCP or Specialist Appt within 3-5 Days - Complete -  Home Care Screening - - Complete  Medication Review (RN CM) - - Complete  HRI or [place] - Complete -  Social Work Consult for [place] Planning/Counseling - Complete -  Palliative Care Screening - Not Complete -  Palliative Care Screening Not Complete Comments - NA -  Medication Review Press photographer) Complete Complete -  PCP or Specialist appointment within 3-5 days of discharge Complete - -  [place] or Home Care Consult Complete - -  SW Recovery Care/Counseling Consult Complete - -  Palliative Care Screening Not Applicable - -  Skilled Nursing Facility Complete - -   Some recent data might be hidden

## 2019-05-28 NOTE — Discharge Summary (Addendum)
Physician Discharge Summary  Patient ID: Russell Dillon MRN: [id number] DOB/AGE: Feb 23, 1946 73 y.o.  Admit date: 05/22/2019 Discharge date: 05/28/2019  Admission Diagnoses:  Discharge Diagnoses:  Principal Problem:   UTI/severe sepsis with septic shock   Bacteremia due to Enterococcus Active Problems:   Persistent atrial fibrillation with RVR   Chronic diastolic CHF (congestive heart failure) (HCC)   Urinary tract infection   Discharged Condition: stable  Hospital Course: Patient is a 73 year old male with past medical history significant for chronic atrial fibrillation on anticoagulation, dementia, hypertension, hyperlipidemia, diastolic congestive heart failure, aortic valve replacement and status post exploratory laparoscopy for lysis of adhesion that led to small bowel obstruction.  Patient was admitted with severe sepsis.  Work-up revealed likely source as urinary tract.  Urine and blood culture grew Enterococcus faecalis.  Infectious disease team and critical care team were both consulted to assist with management.  Patient has completed course of antibiotics.  Patient's wife, as per collateral information, has insisted on patient being discharged back home.  This decision is in contrast to the recommendation by the physical therapist.  Physical therapy team is recommended skilled nursing facility placement.  There is a chance that disposition back home may fail.  UTI/sepsis secondary to Enterococcus faecalis:  -Patient was septic on presentation.   -Urine and blood culture grew Enterococcus faecalis.   -Critical care team and infectious disease team were consulted to assist with patient's management.   -Sepsis physiology has resolved. -Patient has completed course of antibiotics. -Patient will be discharged today. -Skilled nursing facility was recommended, but patient's family preferred to take patient home. -Patient will be discharged back home with home health for physical  therapy, Occupational Therapy, nursing, aide and social work. -Patient will follow with a primary care provider on discharge.  Adynamic Ileus: - Improved.  - CT abdomen with dilated small bowel without defined transition point. Felt to reflect adynamic ileus but also consider partial small bowel obstruction. No other acute findings within the abdomen and pelvis.  -Managed supportively -Surgical input is appreciated -Patient is now on a pureed diet and bowel regimen.   Acute combined toxic and metabolic encephalopathy: On presentation, patient was septic.  Patient also has underlying dementing process.  Sepsis has resolved.  Continue to monitor and manage supportively.    Hypokalemia:  Repleted.   Continue to monitor closely.    Chronic atrial fibrillation with rapid ventricular response:  -Rate is controlled   -Coumadin was continued.    Anemia:  Continue to monitor closely.    Consults: pulmonary/intensive care and ID  Significant Diagnostic Studies: Urine and blood culture grew enterococcus faecalis  Discharge Exam: Blood pressure 111/69, pulse 80, temperature 97.9 F (36.6 C), temperature source Oral, resp. rate 12, height 6' (1.829 m), weight 86.1 kg, SpO2 98 %.  Disposition: Discharge disposition: 06-Home-Health Care Svc   Discharge Instructions    Diet - low sodium heart healthy   Complete by: As directed    Increase activity slowly   Complete by: As directed      Allergies as of 05/28/2019   No Known Allergies     Medication List    STOP taking these medications   HYDROcodone-acetaminophen 5-325 MG tablet Commonly known as: NORCO/VICODIN     TAKE these medications   acetaminophen 325 MG tablet Commonly known as: TYLENOL Take 2 tablets (650 mg total) by mouth every 6 (six) hours as needed for mild pain, fever or headache.   allopurinol 100 MG tablet Commonly  known as: ZYLOPRIM Take 1 tablet (100 mg total) by mouth daily. What changed: when  to take this   colchicine 0.6 MG tablet Take 1 tablet (0.6 mg total) by mouth 2 (two) times daily. What changed: when to take this   cyanocobalamin 1000 MCG/ML injection Commonly known as: (VITAMIN B-12) Inject 1,000 mcg into the muscle every 30 (thirty) days.   diltiazem 120 MG 24 hr capsule Commonly known as: Cardizem CD Take 1 capsule (120 mg total) by mouth daily.   docusate sodium 100 MG capsule Commonly known as: COLACE Take 1 capsule (100 mg total) by mouth 2 (two) times daily. What changed: when to take this   donepezil 10 MG tablet Commonly known as: ARICEPT Take 1 tablet (10 mg total) by mouth daily. What changed: when to take this   feeding supplement (ENSURE ENLIVE) Liqd Take 237 mLs by mouth 2 (two) times daily between meals. What changed:   how much to take  when to take this  reasons to take this  additional instructions   finasteride 5 MG tablet Commonly known as: PROSCAR Take 1 tablet (5 mg total) by mouth every morning.   ICAPS AREDS 2 PO Take 1 capsule by mouth daily.   lactulose 10 GM/15ML solution Commonly known as: CHRONULAC Take 30 mLs (20 g total) by mouth 3 (three) times daily for 10 days.   lithium carbonate 150 MG capsule TAKE 1 CAPSULE(150 MG) BY MOUTH AT BEDTIME What changed: See the new instructions.   omega-3 fish oil 1000 MG Caps capsule Commonly known as: MAXEPA Take 1,000 mg by mouth daily with breakfast.   polyethylene glycol powder 17 GM/SCOOP powder Commonly known as: GLYCOLAX/MIRALAX Take 17 g by mouth daily.   Vitamin D-3 25 MCG (1000 UT) Caps Take 1,000 Units by mouth 2 (two) times a day.   warfarin 2.5 MG tablet Commonly known as: Coumadin Take as directed. If you are unsure how to take this medication, talk to your nurse or doctor. Original instructions: Take 1 tablet (2.5 mg total) by mouth daily. What changed: when to take this      [place] Follow up.    Specialty: Home Health Services Why: [place] to Resume. [phone]          Signed: Bonnell Public 05/28/2019, 1:26 PM  05/29/2019: 2:53 PM Patient was discharged yesterday. Due to some reasons, I guess from patient's family, patient did not go home yesterday. Patient had eventually left the hospital.  No changes to the discharge summary done yesterday.  Patient was seen and examined earlier.

## 2019-05-29 LAB — CULTURE, BLOOD (ROUTINE X 2)
Culture: NO GROWTH
Culture: NO GROWTH
Special Requests: ADEQUATE
Special Requests: ADEQUATE

## 2019-05-29 LAB — PROTIME-INR
INR: 3 — ABNORMAL HIGH (ref 0.8–1.2)
Prothrombin Time: 30.3 seconds — ABNORMAL HIGH (ref 11.4–15.2)

## 2019-05-29 MED ORDER — WARFARIN SODIUM 2.5 MG PO TABS: 2.5000 mg | ORAL_TABLET | Freq: Every day | ORAL | Status: DC

## 2019-05-29 NOTE — Progress Notes (Signed)
Nsg Discharge Note  Admit Date:  05/22/2019 Discharge date: 05/29/2019   Russell Dillon to be D/C'd Home per MD order.  AVS completed.  Copy for chart, and copy for patient sent with PTAR Wife, [name], said she had reviewed discharge instructions with a nurse yesterday,was able to verbalize understanding, and has home health set up. She requested pt be discharged sometime after lunch today and transported home by ambulance. Discharge Medication: Allergies as of 05/29/2019   No Known Allergies     Medication List    STOP taking these medications   HYDROcodone-acetaminophen 5-325 MG tablet Commonly known as: NORCO/VICODIN     TAKE these medications   acetaminophen 325 MG tablet Commonly known as: TYLENOL Take 2 tablets (650 mg total) by mouth every 6 (six) hours as needed for mild pain, fever or headache.   allopurinol 100 MG tablet Commonly known as: ZYLOPRIM Take 1 tablet (100 mg total) by mouth daily. What changed: when to take this   colchicine 0.6 MG tablet Take 1 tablet (0.6 mg total) by mouth 2 (two) times daily. What changed: when to take this   cyanocobalamin 1000 MCG/ML injection Commonly known as: (VITAMIN B-12) Inject 1,000 mcg into the muscle every 30 (thirty) days.   diltiazem 120 MG 24 hr capsule Commonly known as: Cardizem CD Take 1 capsule (120 mg total) by mouth daily.   docusate sodium 100 MG capsule Commonly known as: COLACE Take 1 capsule (100 mg total) by mouth 2 (two) times daily. What changed: when to take this   donepezil 10 MG tablet Commonly known as: ARICEPT Take 1 tablet (10 mg total) by mouth daily. What changed: when to take this   feeding supplement (ENSURE ENLIVE) Liqd Take 237 mLs by mouth 2 (two) times daily between meals. What changed:   how much to take  when to take this  reasons to take this  additional instructions   finasteride 5 MG tablet Commonly known as: PROSCAR Take 1 tablet (5 mg total) by mouth every  morning.   ICAPS AREDS 2 PO Take 1 capsule by mouth daily.   lactulose 10 GM/15ML solution Commonly known as: CHRONULAC Take 30 mLs (20 g total) by mouth 3 (three) times daily for 10 days.   lithium carbonate 150 MG capsule TAKE 1 CAPSULE(150 MG) BY MOUTH AT BEDTIME What changed: See the new instructions.   omega-3 fish oil 1000 MG Caps capsule Commonly known as: MAXEPA Take 1,000 mg by mouth daily with breakfast.   polyethylene glycol powder 17 GM/SCOOP powder Commonly known as: GLYCOLAX/MIRALAX Take 17 g by mouth daily.   Vitamin D-3 25 MCG (1000 UT) Caps Take 1,000 Units by mouth 2 (two) times a day.   warfarin 2.5 MG tablet Commonly known as: Coumadin Take as directed. If you are unsure how to take this medication, talk to your nurse or doctor. Original instructions: Take 1 tablet (2.5 mg total) by mouth daily. What changed: when to take this       Discharge Assessment: Vitals:   05/29/19 0350 05/29/19 1100  BP:    Pulse: 85   Resp: 19 16  Temp:    SpO2: 97%    Skin clean, dry and intact without evidence of skin break down, no evidence of skin tears noted. IV catheter discontinued intact. Site without signs and symptoms of complications - no redness or edema noted at insertion site, patient denies c/o pain - only slight tenderness at site.  Dressing with slight pressure  applied.  D/c Instructions-Education: Discharge instructions given to family with verbalized understanding. D/c education completed with peggy  including follow up instructions, medication list, d/c activities limitations if indicated, with other d/c instructions as indicated by MD - patient able to verbalize understanding, all questions fully answered. Patient instructed to return to ED, call 911, or call MD for any changes in condition.  Patient transported  via ptar.  Hassan Rowan, RN 05/29/2019 12:41 PM

## 2019-05-29 NOTE — Progress Notes (Signed)
Pt is being slightly combative & refusing to let RN change his abdominal dressing.pt's dressing is clean dry & intact. [place] Brunette, RN

## 2019-05-29 NOTE — Progress Notes (Signed)
I called pt's wife, [name], about discharge.  She stated if we call transport anytime after lunch that would be fine.  I told her I would do just that and that I would call her when PTAR arrives for transport.  [name] said she has home health coming to help her.

## 2019-05-29 NOTE — Progress Notes (Signed)
ANTICOAGULATION CONSULT NOTE - Follow Up Consult  Pharmacy Consult for Warfarin Indication: atrial fibrillation  No Known Allergies  Patient Measurements: Height: 6' (182.9 cm) Weight: 189 lb 13.1 oz (86.1 kg) IBW/kg (Calculated) : 77.6  Vital Signs: Temp: 97.8 F (36.6 C) (07/17 2331) Temp Source: Oral (07/17 2331) BP: 119/69 (07/18 0340) Pulse Rate: 85 (07/18 0350)  Labs: Recent Labs    05/27/19 0232 05/28/19 0357 05/29/19 0218  HGB 9.4*  --   --   HCT 30.2*  --   --   PLT 306  --   --   LABPROT 25.6* 26.6* 30.3*  INR 2.4* 2.5* 3.0*  CREATININE 0.77 0.72  --     Estimated Creatinine Clearance: 88.9 mL/min (by C-G formula based on SCr of 0.72 mg/dL).  Assessment: 74 yr old male on Warfarin 2.5 mg daily PTA for atrial fibrillation. Admitted 7/11 with AMS and fever. Recent SBO and exploratory lap 05/11/19. Warfarin initially held, resumed 7/13 PM.  INR is therapeutic at 3 this morning. He missed dose last night as pharmacy thought he was discharging yesterday. INR still trended up despite not receiving warfarin.  PTA Coumadin per patient: 2.5 mg daily, last dose PTA was on 7/10 Prior [facility] notes show 5mg  MWF, 2.5mg  TTSS, last note 03/10/19  Goal of Therapy:  INR 2-3 Monitor platelets by anticoagulation protocol: Yes   Plan:  Warfarin 2.5 mg PO daily Monitor daily PT/INR Monitor for signs and symptoms of bleeding   Thank you for involving pharmacy in this patient's care.  Renold Genta, PharmD, BCPS Clinical Pharmacist Clinical phone for 05/29/2019 until 3p is [phone] 05/29/2019 11:15 AM  **Pharmacist phone directory can be found on [email or web address] listed under [place]**

## 2019-05-30 DIAGNOSIS — S83282D Other tear of lateral meniscus, current injury, left knee, subsequent encounter: Secondary | ICD-10-CM | POA: Diagnosis not present

## 2019-05-30 DIAGNOSIS — N390 Urinary tract infection, site not specified: Secondary | ICD-10-CM | POA: Diagnosis not present

## 2019-05-30 DIAGNOSIS — S83242D Other tear of medial meniscus, current injury, left knee, subsequent encounter: Secondary | ICD-10-CM | POA: Diagnosis not present

## 2019-05-30 DIAGNOSIS — S72492D Other fracture of lower end of left femur, subsequent encounter for closed fracture with routine healing: Secondary | ICD-10-CM | POA: Diagnosis not present

## 2019-05-30 DIAGNOSIS — S82142D Displaced bicondylar fracture of left tibia, subsequent encounter for closed fracture with routine healing: Secondary | ICD-10-CM | POA: Diagnosis not present

## 2019-05-30 DIAGNOSIS — S76112D Strain of left quadriceps muscle, fascia and tendon, subsequent encounter: Secondary | ICD-10-CM | POA: Diagnosis not present

## 2019-05-31 ENCOUNTER — Other Ambulatory Visit: Payer: Self-pay

## 2019-05-31 DIAGNOSIS — S76112D Strain of left quadriceps muscle, fascia and tendon, subsequent encounter: Secondary | ICD-10-CM | POA: Diagnosis not present

## 2019-05-31 DIAGNOSIS — S72492D Other fracture of lower end of left femur, subsequent encounter for closed fracture with routine healing: Secondary | ICD-10-CM | POA: Diagnosis not present

## 2019-05-31 DIAGNOSIS — S83242D Other tear of medial meniscus, current injury, left knee, subsequent encounter: Secondary | ICD-10-CM | POA: Diagnosis not present

## 2019-05-31 DIAGNOSIS — S82142D Displaced bicondylar fracture of left tibia, subsequent encounter for closed fracture with routine healing: Secondary | ICD-10-CM | POA: Diagnosis not present

## 2019-05-31 DIAGNOSIS — S83282D Other tear of lateral meniscus, current injury, left knee, subsequent encounter: Secondary | ICD-10-CM | POA: Diagnosis not present

## 2019-05-31 DIAGNOSIS — N390 Urinary tract infection, site not specified: Secondary | ICD-10-CM | POA: Diagnosis not present

## 2019-05-31 NOTE — Patient Outreach (Signed)
Transition of care: New referral/ discharged on 05/29/2019  Sepsis  Placed call to patient with no answer and no ability to leave a voicemail.  PLAN:  Will send unsuccessful letter and attempt again in 3 days.   Tomasa Rand, RN, BSN, CEN [facility] [employer] [phone]

## 2019-06-01 DIAGNOSIS — I482 Chronic atrial fibrillation, unspecified: Secondary | ICD-10-CM | POA: Diagnosis not present

## 2019-06-01 DIAGNOSIS — F0390 Unspecified dementia without behavioral disturbance: Secondary | ICD-10-CM | POA: Diagnosis not present

## 2019-06-01 DIAGNOSIS — Z5181 Encounter for therapeutic drug level monitoring: Secondary | ICD-10-CM | POA: Diagnosis not present

## 2019-06-01 DIAGNOSIS — S83282D Other tear of lateral meniscus, current injury, left knee, subsequent encounter: Secondary | ICD-10-CM | POA: Diagnosis not present

## 2019-06-01 DIAGNOSIS — Z7901 Long term (current) use of anticoagulants: Secondary | ICD-10-CM | POA: Diagnosis not present

## 2019-06-01 DIAGNOSIS — Z952 Presence of prosthetic heart valve: Secondary | ICD-10-CM | POA: Diagnosis not present

## 2019-06-01 DIAGNOSIS — N390 Urinary tract infection, site not specified: Secondary | ICD-10-CM | POA: Diagnosis not present

## 2019-06-01 DIAGNOSIS — S72492D Other fracture of lower end of left femur, subsequent encounter for closed fracture with routine healing: Secondary | ICD-10-CM | POA: Diagnosis not present

## 2019-06-01 DIAGNOSIS — S83242D Other tear of medial meniscus, current injury, left knee, subsequent encounter: Secondary | ICD-10-CM | POA: Diagnosis not present

## 2019-06-01 DIAGNOSIS — W19XXXD Unspecified fall, subsequent encounter: Secondary | ICD-10-CM | POA: Diagnosis not present

## 2019-06-01 DIAGNOSIS — S82142D Displaced bicondylar fracture of left tibia, subsequent encounter for closed fracture with routine healing: Secondary | ICD-10-CM | POA: Diagnosis not present

## 2019-06-01 DIAGNOSIS — S76112D Strain of left quadriceps muscle, fascia and tendon, subsequent encounter: Secondary | ICD-10-CM | POA: Diagnosis not present

## 2019-06-02 DIAGNOSIS — S83282D Other tear of lateral meniscus, current injury, left knee, subsequent encounter: Secondary | ICD-10-CM | POA: Diagnosis not present

## 2019-06-02 DIAGNOSIS — N390 Urinary tract infection, site not specified: Secondary | ICD-10-CM | POA: Diagnosis not present

## 2019-06-02 DIAGNOSIS — S72492D Other fracture of lower end of left femur, subsequent encounter for closed fracture with routine healing: Secondary | ICD-10-CM | POA: Diagnosis not present

## 2019-06-02 DIAGNOSIS — S82142D Displaced bicondylar fracture of left tibia, subsequent encounter for closed fracture with routine healing: Secondary | ICD-10-CM | POA: Diagnosis not present

## 2019-06-02 DIAGNOSIS — S76112D Strain of left quadriceps muscle, fascia and tendon, subsequent encounter: Secondary | ICD-10-CM | POA: Diagnosis not present

## 2019-06-02 DIAGNOSIS — S83242D Other tear of medial meniscus, current injury, left knee, subsequent encounter: Secondary | ICD-10-CM | POA: Diagnosis not present

## 2019-06-03 ENCOUNTER — Other Ambulatory Visit: Payer: Self-pay

## 2019-06-03 DIAGNOSIS — E538 Deficiency of other specified B group vitamins: Secondary | ICD-10-CM | POA: Diagnosis not present

## 2019-06-03 DIAGNOSIS — B952 Enterococcus as the cause of diseases classified elsewhere: Secondary | ICD-10-CM | POA: Diagnosis not present

## 2019-06-03 DIAGNOSIS — R5381 Other malaise: Secondary | ICD-10-CM | POA: Diagnosis not present

## 2019-06-03 DIAGNOSIS — Z8719 Personal history of other diseases of the digestive system: Secondary | ICD-10-CM | POA: Diagnosis not present

## 2019-06-03 DIAGNOSIS — I1310 Hypertensive heart and chronic kidney disease without heart failure, with stage 1 through stage 4 chronic kidney disease, or unspecified chronic kidney disease: Secondary | ICD-10-CM | POA: Diagnosis not present

## 2019-06-03 DIAGNOSIS — D649 Anemia, unspecified: Secondary | ICD-10-CM | POA: Diagnosis not present

## 2019-06-03 DIAGNOSIS — S83242D Other tear of medial meniscus, current injury, left knee, subsequent encounter: Secondary | ICD-10-CM | POA: Diagnosis not present

## 2019-06-03 DIAGNOSIS — N182 Chronic kidney disease, stage 2 (mild): Secondary | ICD-10-CM | POA: Diagnosis not present

## 2019-06-03 DIAGNOSIS — N390 Urinary tract infection, site not specified: Secondary | ICD-10-CM | POA: Diagnosis not present

## 2019-06-03 DIAGNOSIS — S76112D Strain of left quadriceps muscle, fascia and tendon, subsequent encounter: Secondary | ICD-10-CM | POA: Diagnosis not present

## 2019-06-03 DIAGNOSIS — I480 Paroxysmal atrial fibrillation: Secondary | ICD-10-CM | POA: Diagnosis not present

## 2019-06-03 DIAGNOSIS — F0390 Unspecified dementia without behavioral disturbance: Secondary | ICD-10-CM | POA: Diagnosis not present

## 2019-06-03 DIAGNOSIS — Z7901 Long term (current) use of anticoagulants: Secondary | ICD-10-CM | POA: Diagnosis not present

## 2019-06-03 DIAGNOSIS — S82142D Displaced bicondylar fracture of left tibia, subsequent encounter for closed fracture with routine healing: Secondary | ICD-10-CM | POA: Diagnosis not present

## 2019-06-03 DIAGNOSIS — S83282D Other tear of lateral meniscus, current injury, left knee, subsequent encounter: Secondary | ICD-10-CM | POA: Diagnosis not present

## 2019-06-03 DIAGNOSIS — S72492D Other fracture of lower end of left femur, subsequent encounter for closed fracture with routine healing: Secondary | ICD-10-CM | POA: Diagnosis not present

## 2019-06-03 NOTE — Patient Outreach (Signed)
Transition of care: 2nd attempt successful.  Placed call to patient and spoke with wife [name] who is caregiver.  Patient with dementia and unable to talk on the phone. Wife reports patient is doing okay.  Wife reports incision is looking good. Wife reports she is doing dressing changes because home health did not show up. Wife reports she learned how to do dressing changes while patient was in the hospital. Wife reports patient has a very low energy level. Reports eating and sleeping well.Wife reports home health nursing supervisor came out on 05/30/2019 and she has not heard from anyone since. Reports PT came out for an evaluation on 05/31/2019 and OT came today.  Wife reports patient has stiff legs.    Reviewed medications and wife is managing medications well with the exceptions of lactulose. Prescription was written for 30 mls three times per day for 10 days which would equal 900 ml. Amount provided to wife was 237 ml and now patient is almost out.  [facility] [address] and was informed that patient would not have sufficient amount of medication   Home Health: [facility] and spoke with Merry Proud who confirms that patient will be seen by nurse tomorrow, PT tomorrow as well.  This information provided to wife.   Placed call to Dr. Manya Silvas office about lactulose. Received call back from nurse who said the nurse practitioner did a virtual visit today and will [facility] [address].   Bowels: Wife reports bowels are lose as that are supposed to be. Atleast one BM daily.   Surgeon follow up: Encouraged wife to call surgeon and make a follow up office visit.  THN CM Care Plan Problem One     Most Recent Value  Care Plan Problem One  Recent admission for abdominal surgery and UTI  Role Documenting the Problem One  Care Management Coordinator  Care Plan for Problem One  Active  THN Long Term Goal   Wife will report no readmissions to the hospital for  complications from surgery.  THN Long Term Goal Start Date  06/03/19  Interventions for Problem One Long Term Goal  Reviewed discharge plan, follow up and medications,.  Reviewed [facility] program.  Placed call to MD office and home health.   THN CM Short Term Goal #1   Wife will report follow up scheduled with surgeon in the next 7 days.   THN CM Short Term Goal #1 Start Date  06/03/19  Interventions for Short Term Goal #1  Reviewed importnace of follow up with MD. Encouraged wife to call and make an appointment.   THN CM Short Term Goal #2   Wife will report picking up lactulose from pharmacy in the next 2 days.   THN CM Short Term Goal #2 Start Date  06/03/19  Interventions for Short Term Goal #2  Placed call to MD office and pharmacy to inquire about quanitity, confirmed with MD new RX sent in to pharmacy. Wife informed to pick up medication.         PLAN: informed wife to pick up RX. Reviewed importance of follow up with surgeon asap.  Encouraged daily exercises as much as possible.  Will plan follow up in 1 week.  This note and barrier letter sent to MD. Welcome packet sent to patient.  Tomasa Rand, RN, BSN, CEN [facility] [employer] [phone]

## 2019-06-04 DIAGNOSIS — S83282D Other tear of lateral meniscus, current injury, left knee, subsequent encounter: Secondary | ICD-10-CM | POA: Diagnosis not present

## 2019-06-04 DIAGNOSIS — S82142D Displaced bicondylar fracture of left tibia, subsequent encounter for closed fracture with routine healing: Secondary | ICD-10-CM | POA: Diagnosis not present

## 2019-06-04 DIAGNOSIS — S76112D Strain of left quadriceps muscle, fascia and tendon, subsequent encounter: Secondary | ICD-10-CM | POA: Diagnosis not present

## 2019-06-04 DIAGNOSIS — S72492D Other fracture of lower end of left femur, subsequent encounter for closed fracture with routine healing: Secondary | ICD-10-CM | POA: Diagnosis not present

## 2019-06-04 DIAGNOSIS — N390 Urinary tract infection, site not specified: Secondary | ICD-10-CM | POA: Diagnosis not present

## 2019-06-04 DIAGNOSIS — S83242D Other tear of medial meniscus, current injury, left knee, subsequent encounter: Secondary | ICD-10-CM | POA: Diagnosis not present

## 2019-06-08 DIAGNOSIS — S82142D Displaced bicondylar fracture of left tibia, subsequent encounter for closed fracture with routine healing: Secondary | ICD-10-CM | POA: Diagnosis not present

## 2019-06-08 DIAGNOSIS — S76112D Strain of left quadriceps muscle, fascia and tendon, subsequent encounter: Secondary | ICD-10-CM | POA: Diagnosis not present

## 2019-06-08 DIAGNOSIS — S83242D Other tear of medial meniscus, current injury, left knee, subsequent encounter: Secondary | ICD-10-CM | POA: Diagnosis not present

## 2019-06-08 DIAGNOSIS — S72492D Other fracture of lower end of left femur, subsequent encounter for closed fracture with routine healing: Secondary | ICD-10-CM | POA: Diagnosis not present

## 2019-06-08 DIAGNOSIS — S83282D Other tear of lateral meniscus, current injury, left knee, subsequent encounter: Secondary | ICD-10-CM | POA: Diagnosis not present

## 2019-06-08 DIAGNOSIS — N390 Urinary tract infection, site not specified: Secondary | ICD-10-CM | POA: Diagnosis not present

## 2019-06-09 ENCOUNTER — Other Ambulatory Visit: Payer: Self-pay

## 2019-06-09 ENCOUNTER — Other Ambulatory Visit: Payer: Medicare Other | Admitting: Licensed Clinical Social Worker

## 2019-06-09 DIAGNOSIS — Z515 Encounter for palliative care: Secondary | ICD-10-CM

## 2019-06-09 NOTE — Progress Notes (Signed)
COMMUNITY PALLIATIVE CARE SW NOTE  PATIENT NAME: Russell Dillon DOB: July 03, 1946 MRN: [id number]  PRIMARY CARE PROVIDER: Marton Redwood, MD  RESPONSIBLE PARTY:  Acct ID - Guarantor Home Phone Work Phone Relationship Acct Type  [id number] Russell Dillon,* [phone]  Self P/F     [name], [name], [place] [address]   Due to the COVID-19 crisis, this virtual check-in visit was done via telephone from my office and it was initiated and consent given by thispatientand or family.   PLAN OF CARE and INTERVENTIONS:             1. GOALS OF CARE/ ADVANCE CARE PLANNING:  Russell Dillon, patient's wife, wishes to provide the best possible life possible for patient.  Patient is a full code. 2. SOCIAL/EMOTIONAL/SPIRITUAL ASSESSMENT/ INTERVENTIONS:  SW conducted a virtual check in visit with patient's wife.  Patient has had two recent hospitalizations.  One was for a UTI and the most recent was for a bowel obstruction.  Russell Dillon said the wound is healing.  Patient is displaying increased confusion also.  SW provided active listening and supportive counseling. 3. PATIENT/CAREGIVER EDUCATION/ COPING:  Russell Dillon copes by expressing her feelings openly 4. PERSONAL EMERGENCY PLAN:  Russell Dillon will call EMS. 5. COMMUNITY RESOURCES COORDINATION/ HEALTH CARE NAVIGATION:  Patient receives services from Russell Dillon.  He has caregivers from [name] from 8a-10a, 2p-3p. And 8p-9p. 6. FINANCIAL/LEGAL CONCERNS/INTERVENTIONS:  None.     SOCIAL HX:  Social History   Tobacco Use  . Smoking status: Never Smoker  . Smokeless tobacco: Never Used  Substance Use Topics  . Alcohol use: No    Alcohol/week: 0.0 standard drinks    CODE STATUS:  Full Code  ADVANCED DIRECTIVES: N MOST FORM COMPLETE:  N HOSPICE EDUCATION PROVIDED:  N PPS:  Patient's appetite has decreased.  He has difficulty even sitting up on the side of his bed now. Duration of visit and documentation:  45 minutes.      [name], LCSW

## 2019-06-10 ENCOUNTER — Other Ambulatory Visit: Payer: Self-pay

## 2019-06-10 DIAGNOSIS — N390 Urinary tract infection, site not specified: Secondary | ICD-10-CM | POA: Diagnosis not present

## 2019-06-10 DIAGNOSIS — S76112D Strain of left quadriceps muscle, fascia and tendon, subsequent encounter: Secondary | ICD-10-CM | POA: Diagnosis not present

## 2019-06-10 DIAGNOSIS — S83242D Other tear of medial meniscus, current injury, left knee, subsequent encounter: Secondary | ICD-10-CM | POA: Diagnosis not present

## 2019-06-10 DIAGNOSIS — S83282D Other tear of lateral meniscus, current injury, left knee, subsequent encounter: Secondary | ICD-10-CM | POA: Diagnosis not present

## 2019-06-10 DIAGNOSIS — S72492D Other fracture of lower end of left femur, subsequent encounter for closed fracture with routine healing: Secondary | ICD-10-CM | POA: Diagnosis not present

## 2019-06-10 DIAGNOSIS — S82142D Displaced bicondylar fracture of left tibia, subsequent encounter for closed fracture with routine healing: Secondary | ICD-10-CM | POA: Diagnosis not present

## 2019-06-10 NOTE — Patient Outreach (Signed)
Transition of care note:  Placed call to patient and spoke with wife.  Mrs. Blaschke reports patient with dark, cloudy urine. Reports being treated for a UTI.  Wife states culture is pending.  Reports patient is sluggish with a decreased appetite. Reports no fever.  Wife reports follow up with surgeon planned for tomorrow.  Reports she has transportation arranged.   Wife states wound "look good"  Wife reports patient continues to work with PT.  States patient has been able to sit on the side of the bed twice this week.   Wife states that patient continues to take lactulose and miralax.  Wife sounds tired on the phone. We discussed caregiver stress and burn out. Encouraged wife to take some breaks and care for herself.  Reviewed importance of good nutrition and rest to stay healthy.   PLAN: will continue weekly transition of care calls. Encouraged wife to call me if needed.  Reviewed importance of keeping patient hydrated and encouraged ensure supplements.  Reviewed with wife the importance of calling MD for any changes in condition.  Tomasa Rand, RN, BSN, CEN [facility] [employer] [phone]

## 2019-06-14 LAB — PROTEIN, BODY FLUID (OTHER): Total Protein, Body Fluid Other: 3.2 g/dL

## 2019-06-14 LAB — GLUCOSE, BODY FLUID OTHER: Glucose, Body Fluid Other: 81 mg/dL

## 2019-06-17 ENCOUNTER — Other Ambulatory Visit: Payer: Self-pay

## 2019-06-17 DIAGNOSIS — S76112D Strain of left quadriceps muscle, fascia and tendon, subsequent encounter: Secondary | ICD-10-CM | POA: Diagnosis not present

## 2019-06-17 DIAGNOSIS — S72492D Other fracture of lower end of left femur, subsequent encounter for closed fracture with routine healing: Secondary | ICD-10-CM | POA: Diagnosis not present

## 2019-06-17 DIAGNOSIS — S83282D Other tear of lateral meniscus, current injury, left knee, subsequent encounter: Secondary | ICD-10-CM | POA: Diagnosis not present

## 2019-06-17 DIAGNOSIS — S82142D Displaced bicondylar fracture of left tibia, subsequent encounter for closed fracture with routine healing: Secondary | ICD-10-CM | POA: Diagnosis not present

## 2019-06-17 DIAGNOSIS — N390 Urinary tract infection, site not specified: Secondary | ICD-10-CM | POA: Diagnosis not present

## 2019-06-17 DIAGNOSIS — S83242D Other tear of medial meniscus, current injury, left knee, subsequent encounter: Secondary | ICD-10-CM | POA: Diagnosis not present

## 2019-06-17 NOTE — Patient Outreach (Signed)
Transition of care: Recent small bowel obstruction:   Open abdomen incision.    Placed call to patient and spoke with wife who reports patient is doing some better than last week. Reports UTI is going away. Patient has one more day of antibiotics remaining. Wife reports patient is eating and drinking better. Reports PT will come this afternoon. Wife reports patient is up about twice a day for exercise.  Wife reports visit with surgeon went well. Reports dressing changes reduced to once a day. Lactulose discontinued and mirlax is now PRN.  Wife reports follow up planned with surgeon in 1 month.  Wife reports long term care policy should kick in during the month of September.   PLAN: reviewed with wife to keep out for a change in urine and if so to report to MD immediately.  Wife states that she wants home health nurse to repeat urine once finished with antibiotics. Encouraged wife to assist with exercises daily to help improve strength.    Follow up call from [facility] in 1 week.  Tomasa Rand, RN, BSN, CEN [facility] [employer] [phone]

## 2019-06-23 DIAGNOSIS — S82142D Displaced bicondylar fracture of left tibia, subsequent encounter for closed fracture with routine healing: Secondary | ICD-10-CM | POA: Diagnosis not present

## 2019-06-23 DIAGNOSIS — N390 Urinary tract infection, site not specified: Secondary | ICD-10-CM | POA: Diagnosis not present

## 2019-06-23 DIAGNOSIS — S83242D Other tear of medial meniscus, current injury, left knee, subsequent encounter: Secondary | ICD-10-CM | POA: Diagnosis not present

## 2019-06-23 DIAGNOSIS — S83282D Other tear of lateral meniscus, current injury, left knee, subsequent encounter: Secondary | ICD-10-CM | POA: Diagnosis not present

## 2019-06-23 DIAGNOSIS — S76112D Strain of left quadriceps muscle, fascia and tendon, subsequent encounter: Secondary | ICD-10-CM | POA: Diagnosis not present

## 2019-06-23 DIAGNOSIS — S72492D Other fracture of lower end of left femur, subsequent encounter for closed fracture with routine healing: Secondary | ICD-10-CM | POA: Diagnosis not present

## 2019-06-24 ENCOUNTER — Other Ambulatory Visit: Payer: Medicare Other | Admitting: Licensed Clinical Social Worker

## 2019-06-24 ENCOUNTER — Other Ambulatory Visit: Payer: Self-pay | Admitting: *Deleted

## 2019-06-24 ENCOUNTER — Ambulatory Visit: Payer: Medicare Other

## 2019-06-24 DIAGNOSIS — S83282D Other tear of lateral meniscus, current injury, left knee, subsequent encounter: Secondary | ICD-10-CM | POA: Diagnosis not present

## 2019-06-24 DIAGNOSIS — S76112D Strain of left quadriceps muscle, fascia and tendon, subsequent encounter: Secondary | ICD-10-CM | POA: Diagnosis not present

## 2019-06-24 DIAGNOSIS — S82142D Displaced bicondylar fracture of left tibia, subsequent encounter for closed fracture with routine healing: Secondary | ICD-10-CM | POA: Diagnosis not present

## 2019-06-24 DIAGNOSIS — S83242D Other tear of medial meniscus, current injury, left knee, subsequent encounter: Secondary | ICD-10-CM | POA: Diagnosis not present

## 2019-06-24 DIAGNOSIS — N390 Urinary tract infection, site not specified: Secondary | ICD-10-CM | POA: Diagnosis not present

## 2019-06-24 DIAGNOSIS — S72492D Other fracture of lower end of left femur, subsequent encounter for closed fracture with routine healing: Secondary | ICD-10-CM | POA: Diagnosis not present

## 2019-06-24 NOTE — Patient Outreach (Signed)
[facility]) Care Management  06/24/2019  Russell Dillon August 23, 1946 [id number]  Covering for assigned care coordinator Tomasa Rand   Transition of care call    Subjective Outreach call to patient wife [name] she reports that patient is doing better , resting now after having visit from bath aide.  She discussed completed antibiotic for UTI and urine is clear today, she voiced understanding to notify MD of change to dark cloudy urine, more confusion.  She reports patient with no fever. She states that he is eating better , 3 smalls meals a day at least one ensure a day, and drinking at least 6 , 8oz of liquids.  She discussed patient abdominal wound area is healing up , home health RN changed dressing this week reports looked good and she continues to change dressing on other days.  She reports patient bowel are not as loose as they were, starting to thicken up some  voiced understanding of use of miralax as needed.  She discussed patient tolerating sitting on the side of bed 3 times a day and they have been able to help with exercises. Wife discussed being unsure if home health therapy if is still seeing patient, he was seen last week, she has not received a call about visit on this week.   Placed call to [facility] representative states patient has a visit scheduled for today with Benjamine Mola physical therapist  Returned call to patient wife to update of planned visit today, she states patient has been followed by Eustaquio Maize , physical therapy.   Patient wife expressed that she was able to take time and get a haircut for herself recently and that felt good,     Plan Reviewed with wife notifying MD sooner with symptoms of UTI , change in urine color, change in patient confusion.  Reviewed with wife plan for call from [facility] care coordinator in the next week.   Joylene Draft, RN, [place] Management Coordinator  [phone]- Mobile [phone]- Toll Free Main  Office

## 2019-06-25 DIAGNOSIS — S83242D Other tear of medial meniscus, current injury, left knee, subsequent encounter: Secondary | ICD-10-CM | POA: Diagnosis not present

## 2019-06-25 DIAGNOSIS — S76112D Strain of left quadriceps muscle, fascia and tendon, subsequent encounter: Secondary | ICD-10-CM | POA: Diagnosis not present

## 2019-06-25 DIAGNOSIS — S72492D Other fracture of lower end of left femur, subsequent encounter for closed fracture with routine healing: Secondary | ICD-10-CM | POA: Diagnosis not present

## 2019-06-25 DIAGNOSIS — N390 Urinary tract infection, site not specified: Secondary | ICD-10-CM | POA: Diagnosis not present

## 2019-06-25 DIAGNOSIS — S83282D Other tear of lateral meniscus, current injury, left knee, subsequent encounter: Secondary | ICD-10-CM | POA: Diagnosis not present

## 2019-06-25 DIAGNOSIS — S82142D Displaced bicondylar fracture of left tibia, subsequent encounter for closed fracture with routine healing: Secondary | ICD-10-CM | POA: Diagnosis not present

## 2019-06-25 NOTE — Progress Notes (Signed)
COMMUNITY PALLIATIVE CARE SW NOTE  PATIENT NAME: Russell Dillon DOB: 1946-06-15 MRN: [id number]  PRIMARY CARE PROVIDER: Marton Redwood, MD  RESPONSIBLE PARTY:  Acct ID - Guarantor Home Phone Work Phone Relationship Acct Type  [id number] Kullen Tomasetti,* [phone]  Self P/F     [address], [place], [place] [address]   Due to the COVID-19 crisis, this virtual check-in visit was done via telephone from my office and it was initiated and consent given by thispatientand or family.  PLAN OF CARE and INTERVENTIONS:             1. GOALS OF CARE/ ADVANCE CARE PLANNING:  Goal is for patient to have the best possible life.  He is a full code. 2. SOCIAL/EMOTIONAL/SPIRITUAL ASSESSMENT/ INTERVENTIONS:  SW contacted patient's wife, [name], and conducted a virtual check-in visit.  She feels patient has a UTI again and is addressing with the [facility] RN.  PT is assessing patient next week for further sessions.  SW provided active listening and supportive counseling while she discussed caring for her mother. 3. PATIENT/CAREGIVER EDUCATION/ COPING:  [name] copes by praying.  She expresses her feelings openly. 4. PERSONAL EMERGENCY PLAN:  She will contact EMS. 5. COMMUNITY RESOURCES COORDINATION/ HEALTH CARE NAVIGATION:  Patient has caregivers from [facility] from 8a-10a, 2p-3p and 8p-9p.  [facility] also provides a Therapist, sports and PT. 6. FINANCIAL/LEGAL CONCERNS/INTERVENTIONS:  None.     SOCIAL HX:  Social History   Tobacco Use  . Smoking status: Never Smoker  . Smokeless tobacco: Never Used  Substance Use Topics  . Alcohol use: No    Alcohol/week: 0.0 standard drinks    CODE STATUS:  Full Code ADVANCED DIRECTIVES: No MOST FORM COMPLETE:  No HOSPICE EDUCATION PROVIDED: N PPS:  Appetite is unchanged.  He requires assistance with transfers. Duration of visit and documentation:  45 minutes.      Creola Corn Karl Knarr, LCSW

## 2019-06-29 DIAGNOSIS — S82142D Displaced bicondylar fracture of left tibia, subsequent encounter for closed fracture with routine healing: Secondary | ICD-10-CM | POA: Diagnosis not present

## 2019-06-29 DIAGNOSIS — S83282D Other tear of lateral meniscus, current injury, left knee, subsequent encounter: Secondary | ICD-10-CM | POA: Diagnosis not present

## 2019-06-29 DIAGNOSIS — S83242D Other tear of medial meniscus, current injury, left knee, subsequent encounter: Secondary | ICD-10-CM | POA: Diagnosis not present

## 2019-06-29 DIAGNOSIS — S76112D Strain of left quadriceps muscle, fascia and tendon, subsequent encounter: Secondary | ICD-10-CM | POA: Diagnosis not present

## 2019-06-29 DIAGNOSIS — N390 Urinary tract infection, site not specified: Secondary | ICD-10-CM | POA: Diagnosis not present

## 2019-06-29 DIAGNOSIS — S72492D Other fracture of lower end of left femur, subsequent encounter for closed fracture with routine healing: Secondary | ICD-10-CM | POA: Diagnosis not present

## 2019-07-01 ENCOUNTER — Other Ambulatory Visit: Payer: Self-pay

## 2019-07-01 DIAGNOSIS — I351 Nonrheumatic aortic (valve) insufficiency: Secondary | ICD-10-CM | POA: Diagnosis not present

## 2019-07-01 DIAGNOSIS — Z993 Dependence on wheelchair: Secondary | ICD-10-CM | POA: Diagnosis not present

## 2019-07-01 DIAGNOSIS — N390 Urinary tract infection, site not specified: Secondary | ICD-10-CM | POA: Diagnosis not present

## 2019-07-01 DIAGNOSIS — G8929 Other chronic pain: Secondary | ICD-10-CM | POA: Diagnosis not present

## 2019-07-01 DIAGNOSIS — M10062 Idiopathic gout, left knee: Secondary | ICD-10-CM | POA: Diagnosis not present

## 2019-07-01 DIAGNOSIS — Z48815 Encounter for surgical aftercare following surgery on the digestive system: Secondary | ICD-10-CM | POA: Diagnosis not present

## 2019-07-01 DIAGNOSIS — D649 Anemia, unspecified: Secondary | ICD-10-CM | POA: Diagnosis not present

## 2019-07-01 DIAGNOSIS — Z7901 Long term (current) use of anticoagulants: Secondary | ICD-10-CM | POA: Diagnosis not present

## 2019-07-01 DIAGNOSIS — I482 Chronic atrial fibrillation, unspecified: Secondary | ICD-10-CM | POA: Diagnosis not present

## 2019-07-01 DIAGNOSIS — Z5181 Encounter for therapeutic drug level monitoring: Secondary | ICD-10-CM | POA: Diagnosis not present

## 2019-07-01 DIAGNOSIS — E43 Unspecified severe protein-calorie malnutrition: Secondary | ICD-10-CM | POA: Diagnosis not present

## 2019-07-01 DIAGNOSIS — N400 Enlarged prostate without lower urinary tract symptoms: Secondary | ICD-10-CM | POA: Diagnosis not present

## 2019-07-01 DIAGNOSIS — Z8719 Personal history of other diseases of the digestive system: Secondary | ICD-10-CM | POA: Diagnosis not present

## 2019-07-01 DIAGNOSIS — F0391 Unspecified dementia with behavioral disturbance: Secondary | ICD-10-CM | POA: Diagnosis not present

## 2019-07-01 DIAGNOSIS — F319 Bipolar disorder, unspecified: Secondary | ICD-10-CM | POA: Diagnosis not present

## 2019-07-01 DIAGNOSIS — E785 Hyperlipidemia, unspecified: Secondary | ICD-10-CM | POA: Diagnosis not present

## 2019-07-01 DIAGNOSIS — Z9181 History of falling: Secondary | ICD-10-CM | POA: Diagnosis not present

## 2019-07-01 DIAGNOSIS — Z4801 Encounter for change or removal of surgical wound dressing: Secondary | ICD-10-CM | POA: Diagnosis not present

## 2019-07-01 DIAGNOSIS — I119 Hypertensive heart disease without heart failure: Secondary | ICD-10-CM | POA: Diagnosis not present

## 2019-07-01 NOTE — Patient Outreach (Signed)
[facility]) Care Management  07/01/2019   CARROLL LINGELBACH 04-29-1946 [phone] Phone call with wife, [name] Subjective: Russell Dillon reports patient is doing well.  Continues to be active with home health for PT and RN services.  Patient developed what was thought to be a UTI last week and will finish antibiotics today.   Wife reports recent constipation. No BM for 4 days.  Wife has started Miralax and been in touch with primary MD for recommendations.  Wife reports that patient is eating better.  States she continues to have caregivers assist her during the day and long term care policy now in affect to help with cost of caregivers. Wife takes care of all needs at this time including cooking, cleaning and medication management. Wife reports patient is now getting out of the bed daily via the lift to his recliner.   Wife reports concern for frequent UTIs.   Reviewed continued good cleaning and trying to avoid stool contamination.  Objective:  Vitals:   07/01/19 1449  Weight: 165 lb (74.8 kg)  Height: 1.854 m (6' 1" )    Current Medications:  Current Outpatient Medications  Medication Sig Dispense Refill  . acetaminophen (TYLENOL) 325 MG tablet Take 2 tablets (650 mg total) by mouth every 6 (six) hours as needed for mild pain, fever or headache. (Patient not taking: Reported on 06/03/2019)    . allopurinol (ZYLOPRIM) 100 MG tablet Take 1 tablet (100 mg total) by mouth daily. (Patient taking differently: Take 100 mg by mouth at bedtime. ) 30 tablet 1  . Cholecalciferol (VITAMIN D-3) 25 MCG (1000 UT) CAPS Take 1,000 Units by mouth 2 (two) times a day.    . colchicine 0.6 MG tablet Take 1 tablet (0.6 mg total) by mouth 2 (two) times daily. (Patient taking differently: Take 0.6 mg by mouth daily. ) 15 tablet 0  . cyanocobalamin (,VITAMIN B-12,) 1000 MCG/ML injection Inject 1,000 mcg into the muscle every 30 (thirty) days.    Marland Kitchen diltiazem (CARDIZEM CD) 120 MG 24 hr capsule Take  1 capsule (120 mg total) by mouth daily. 30 capsule 11  . docusate sodium (COLACE) 100 MG capsule Take 1 capsule (100 mg total) by mouth 2 (two) times daily. (Patient taking differently: Take 100 mg by mouth at bedtime. ) 60 capsule 0  . donepezil (ARICEPT) 10 MG tablet Take 1 tablet (10 mg total) by mouth daily. (Patient taking differently: Take 10 mg by mouth at bedtime. ) 90 tablet 3  . feeding supplement, ENSURE ENLIVE, (ENSURE ENLIVE) LIQD Take 237 mLs by mouth 2 (two) times daily between meals. (Patient taking differently: Take 118.5-237 mLs by mouth 2 (two) times daily as needed (for supplementation). CHOCOLATE) 237 mL 12  . finasteride (PROSCAR) 5 MG tablet Take 1 tablet (5 mg total) by mouth every morning. 30 tablet 0  . lithium carbonate 150 MG capsule TAKE 1 CAPSULE(150 MG) BY MOUTH AT BEDTIME (Patient taking differently: Take 150 mg by mouth at bedtime. ) 90 capsule 0  . Multiple Vitamins-Minerals (ICAPS AREDS 2 PO) Take 1 capsule by mouth daily.     Marland Kitchen omega-3 fish oil (MAXEPA) 1000 MG CAPS capsule Take 1,000 mg by mouth daily with breakfast.     . warfarin (COUMADIN) 2.5 MG tablet Take 1 tablet (2.5 mg total) by mouth daily. (Patient taking differently: Take 2.5 mg by mouth daily at 6 PM. ) 30 tablet 0   No current facility-administered medications for this visit.  Functional Status:  In your present state of health, do you have any difficulty performing the following activities: 07/01/2019 05/23/2019  Hearing? N N  Vision? N N  Comment wears glasses -  Difficulty concentrating or making decisions? Tempie Donning  Walking or climbing stairs? Y Y  Comment unable to ambulate -  Dressing or bathing? Tempie Donning  Comment has caregivers 24/7 -  Doing errands, shopping? Tempie Donning  Comment uses [employer] and eating ? Y -  Comment wife -  Using the Toilet? Y -  Comment uses diapers -  In the past six months, have you accidently leaked urine? Y -  Comment uses condom cath -  Do  you have problems with loss of bowel control? Y -  Comment diapers -  Managing your Medications? Y -  Comment wife manages medications -  Managing your Finances? N -  Housekeeping or managing your Housekeeping? Y -  Comment wife manages finances. Patient has a long term care policy -  Some recent data might be hidden    Fall/Depression Screening: Fall Risk  07/01/2019 10/18/2016  Falls in the past year? 0 No  Comment - Emmi Telephone Survey: data to providers prior to load   [facility] 2/9 Scores 07/01/2019 07/10/2015 06/30/2015  PHQ - 2 Score - 0 0  Exception Documentation Other- indicate reason in comment box - -  Not completed unable to assess due to confusion - -    Assessment:  (1) current constipation.  Wife has spoken with MD today and has a plan with miralax and lactulose.  (2) abdomen healing.  Dressing once a week. (3) recent UTI (4) caregiver stress.  Plan:  (1) reviewed alternatives to help with constipation like prune juice, warm liquids, fiber and exercise.  Encouraged wife to follow plan of care from MD and call MD for additional questions. (2)reviewed with wife when to call surgeon. (3) reviewed UTI symptoms again with wife and when to call MD. Encouraged good oral intake.  (4) reviewed care giver stress and need to have help so wife can rest.  Wife is agreeable.    Plan follow up call next week. Encouraged wife to call me sooner if needed.  THN CM Care Plan Problem One     Most Recent Value  Care Plan Problem One  Recent admission for abdominal surgery and UTI  Role Documenting the Problem One  Care Management Coordinator  Care Plan for Problem One  Active  THN Long Term Goal   Wife will report no readmissions to the hospital for complications from surgery. in the next 31 days.  THN Long Term Goal Start Date  06/03/19  Interventions for Problem One Long Term Goal  Reviewed current concerns and addressed each.  reviewed with wife when to call MD. Encouraged good oral  intake.   THN CM Short Term Goal #1   Wife will report follow up scheduled with surgeon in the next 7 days.   THN CM Short Term Goal #1 Start Date  06/03/19  THN CM Short Term Goal #1 Met Date  06/10/19  THN CM Short Term Goal #2   Wife will report picking up lactulose from pharmacy in the next 2 days.   THN CM Short Term Goal #2 Start Date  06/03/19  [facility] CM Short Term Goal #2 Met Date  06/10/19     This note sent to MD.  Tomasa Rand, RN, BSN, CEN [place] Coordinator [phone]

## 2019-07-05 IMAGING — US US RENAL
1 series · 14 of 25 positions shown · non-contrast
Comparison: CT scan 02/08/2019

CLINICAL DATA: Urinary tract infection

EXAM:
RENAL / URINARY TRACT ULTRASOUND COMPLETE

[Series 1: us renal · 14 of 57 slices shown]
[im 1/57]
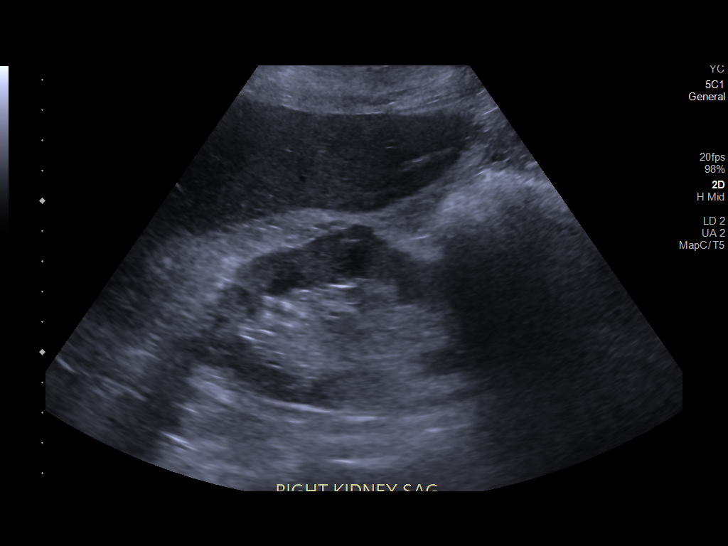
[im 5/57]
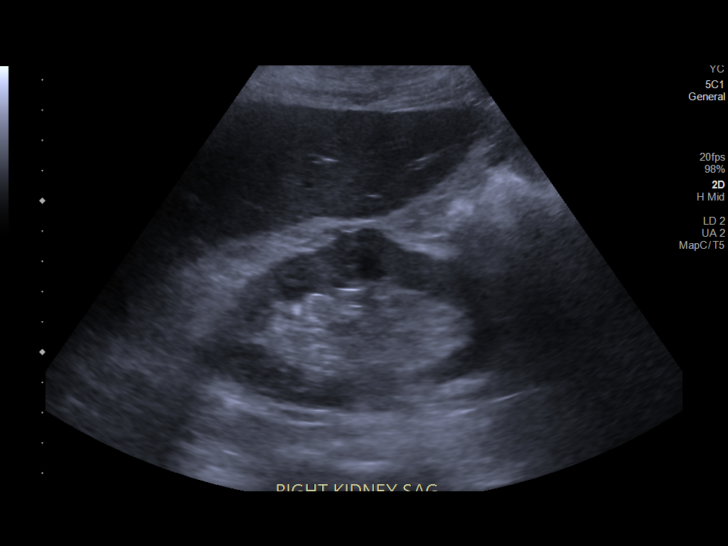
[im 10/57]
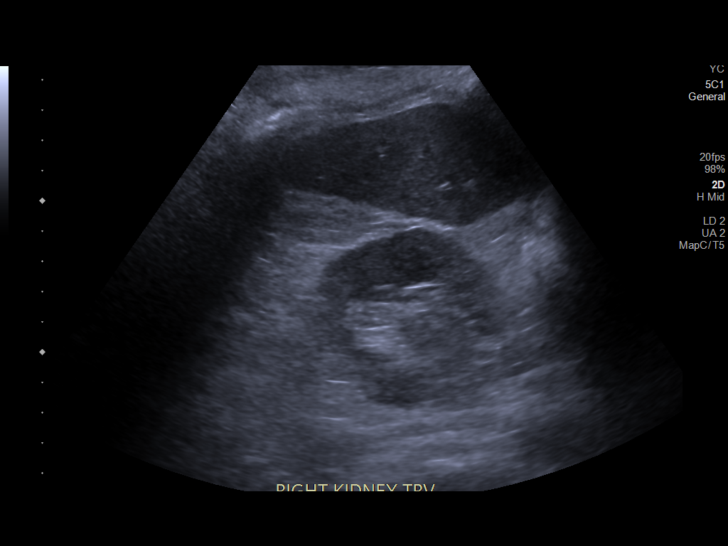
[im 15/57]
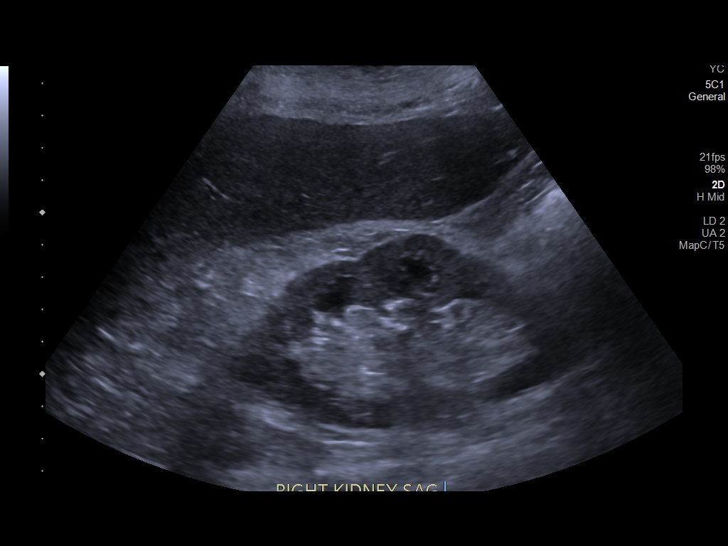
[im 19/57]
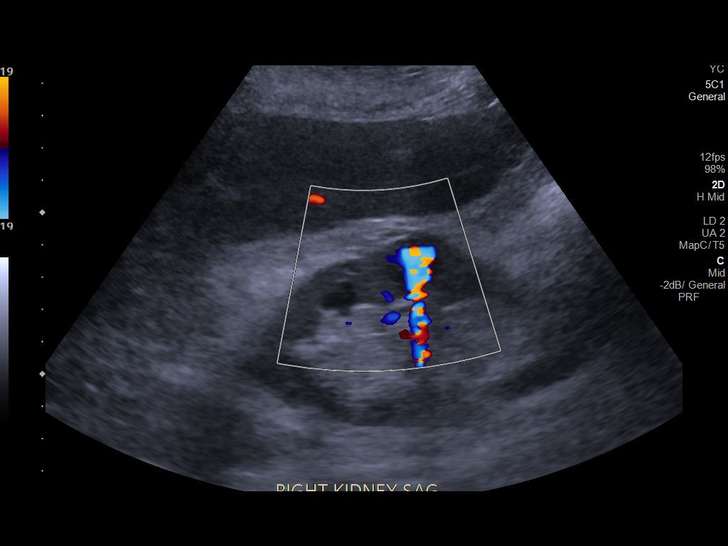
[im 22/57]
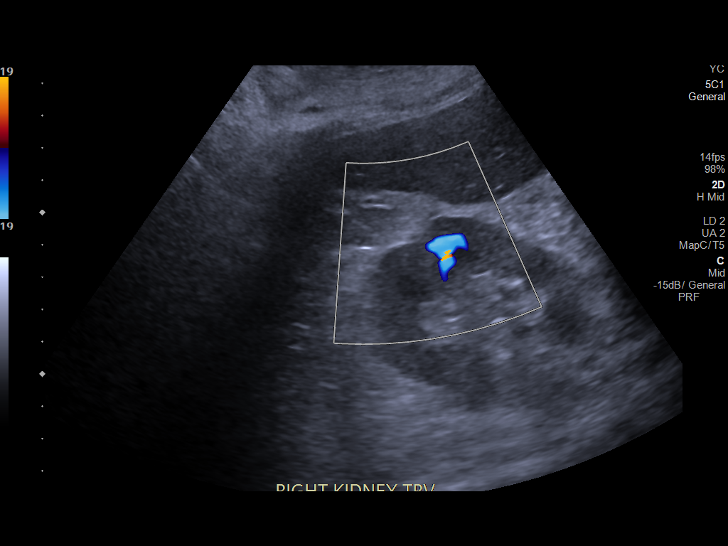
[im 26/57]
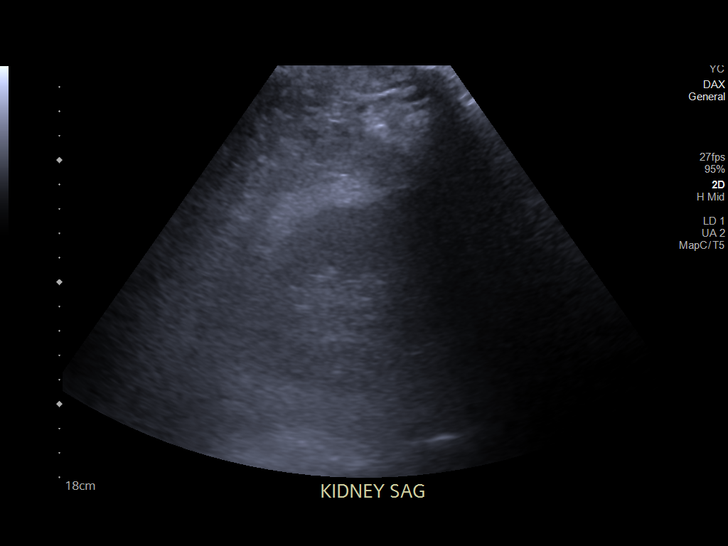
[im 31/57]
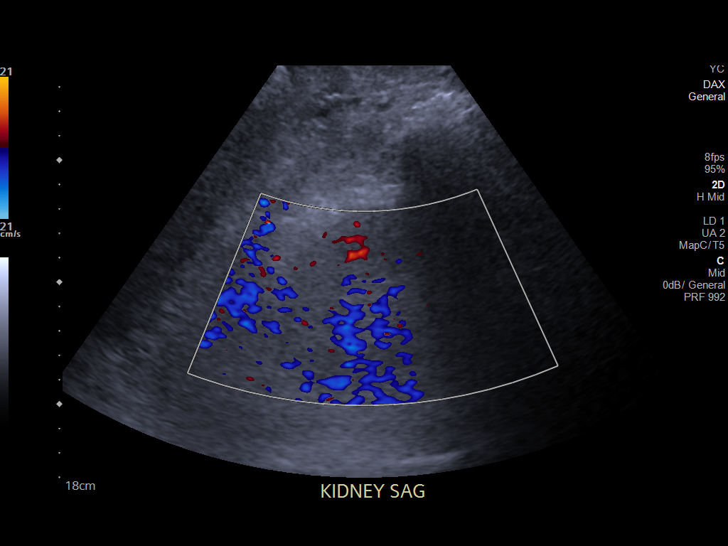
[im 36/57]
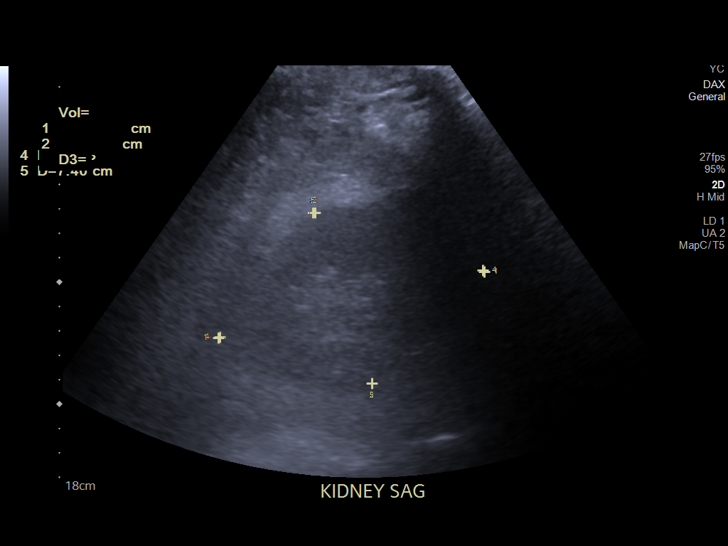
[im 38/57]
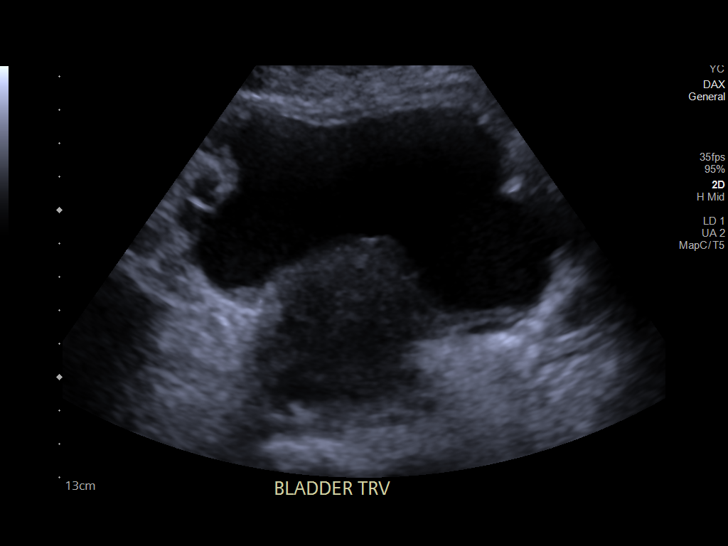
[im 43/57]
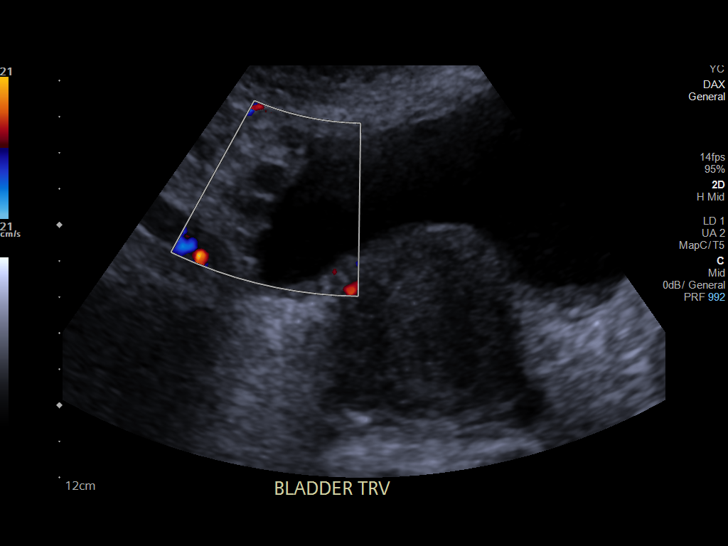
[im 47/57]
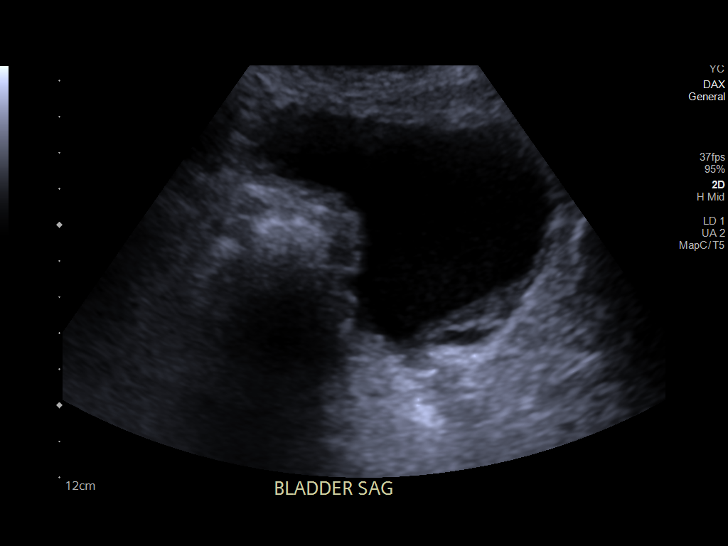
[im 52/57]
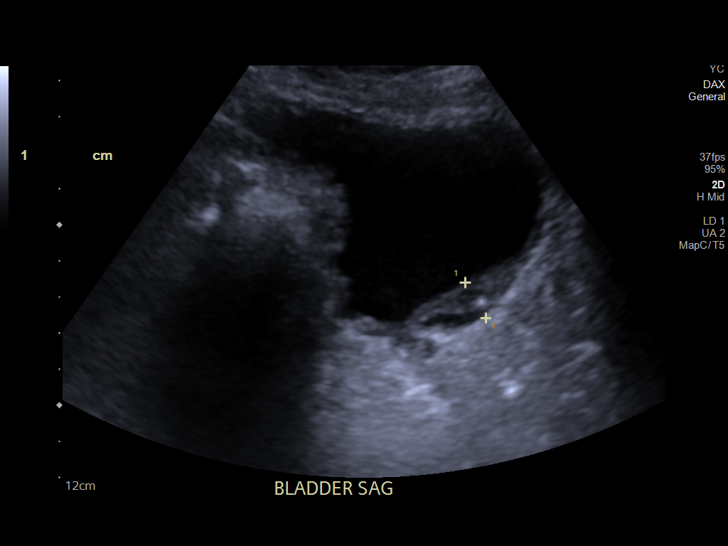
[im 57/57]
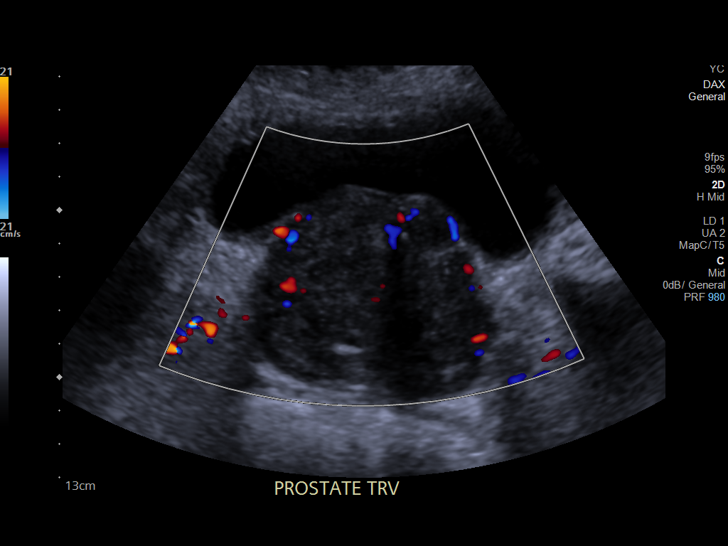

[14 of 25 positions shown; findings below may reference images not displayed]

FINDINGS: Right Kidney:

Renal measurements: 11.0 x 6.2 x 5.6 cm = volume: 201 ML. Cortical
thinning without hydronephrosis. Parenchymal echotexture appears
increased.

Left Kidney:

Renal measurements: 11.2 x 7.4 x 7.3 cm = volume: 318 mL. Poorly
visualized but echotexture may be increased. No hydronephrosis
evident.

Bladder:

Appears normal for degree of bladder distention. Areas of apparent
focal bladder wall thickening may be related to trabeculation.

Note: Prostate gland is markedly enlarged.
IMPRESSION: 1. Areas of bladder wall thickening, potentially related to
trabeculation. Patient was noted to have bladder diverticulum
previous CT in associated marked prostatomegaly raises concern for
bladder outlet obstruction.
2. No hydronephrosis.

## 2019-07-06 ENCOUNTER — Other Ambulatory Visit: Payer: Self-pay

## 2019-07-06 ENCOUNTER — Ambulatory Visit: Payer: Medicare Other | Admitting: Psychiatry

## 2019-07-07 NOTE — Patient Outreach (Signed)
Case closure call:  Placed call to patient and spoke with wife, [name].   Mrs. Papillion reports patient has gout in his right hand. Reports that she spoke with primary MD PA today and RX called in for colchicine.  Wife reports exercises has not been good for the past few days due to gout pain. Reports she has been giving patient cherry juice to help with gout.   Abdomen: Wife reports decrease in constipation. Reports using the lactulose and miralax.  Reports she is self adjusting to get bowels regulated.   Wife reports abdominal wound is almost completed healed. Reports no signs of infection and follow up planned with Dr. Donne Hazel on 07/23/2019 at 11am.    Wife states that she plans to try neuro rehab to see if that helps with patient mobility and strength.   Discussed care giver stress with wife and she admits to exhaustion.   Plan: Reviewed importance of self care with wife. Encouraged wife to take breaks and get extra help if needed. Reviewed progress with wife since patients discharge home and she admits [facility] help was great.  Reviewed case closure and wife is in agreement that patient has achieved all goals set.   Will plan to close case as goals are met. Will send case closure letter to patient and MD.  Tomasa Rand, RN, BSN, CEN [place] Coordinator [phone]

## 2019-07-08 ENCOUNTER — Other Ambulatory Visit: Payer: Self-pay

## 2019-07-08 ENCOUNTER — Encounter: Payer: Self-pay | Admitting: Physical Therapy

## 2019-07-08 ENCOUNTER — Ambulatory Visit: Payer: Medicare Other | Attending: Internal Medicine | Admitting: Physical Therapy

## 2019-07-08 DIAGNOSIS — M25562 Pain in left knee: Secondary | ICD-10-CM | POA: Diagnosis not present

## 2019-07-08 DIAGNOSIS — M6281 Muscle weakness (generalized): Secondary | ICD-10-CM | POA: Diagnosis not present

## 2019-07-08 DIAGNOSIS — G8929 Other chronic pain: Secondary | ICD-10-CM | POA: Diagnosis not present

## 2019-07-08 DIAGNOSIS — R2689 Other abnormalities of gait and mobility: Secondary | ICD-10-CM | POA: Insufficient documentation

## 2019-07-09 DIAGNOSIS — M109 Gout, unspecified: Secondary | ICD-10-CM | POA: Diagnosis not present

## 2019-07-09 DIAGNOSIS — I351 Nonrheumatic aortic (valve) insufficiency: Secondary | ICD-10-CM | POA: Diagnosis not present

## 2019-07-09 DIAGNOSIS — Z48815 Encounter for surgical aftercare following surgery on the digestive system: Secondary | ICD-10-CM | POA: Diagnosis not present

## 2019-07-09 DIAGNOSIS — Z4801 Encounter for change or removal of surgical wound dressing: Secondary | ICD-10-CM | POA: Diagnosis not present

## 2019-07-09 DIAGNOSIS — N390 Urinary tract infection, site not specified: Secondary | ICD-10-CM | POA: Diagnosis not present

## 2019-07-09 DIAGNOSIS — I482 Chronic atrial fibrillation, unspecified: Secondary | ICD-10-CM | POA: Diagnosis not present

## 2019-07-09 DIAGNOSIS — I119 Hypertensive heart disease without heart failure: Secondary | ICD-10-CM | POA: Diagnosis not present

## 2019-07-09 NOTE — Addendum Note (Signed)
Addended by: Debbe Odea on: 07/09/2019 08:29 AM   Modules accepted: Orders

## 2019-07-09 NOTE — Therapy (Signed)
[place], [place], [address] Phone: [phone]   Fax:  [phone]  Physical Therapy Evaluation  Patient Details  Name: Russell Dillon MRN: [id number] Date of Birth: [birth year] Referring Provider (PT): Marton Redwood, MD   Encounter Date: 07/08/2019  PT End of Session - 07/08/19 1705    Visit Number  1    Number of Visits  24    Date for PT Re-Evaluation  09/30/19    Authorization Type  MCR, AARP    PT Start Time  1130    PT Stop Time  1220    PT Time Calculation (min)  50 min    Activity Tolerance  Patient limited by fatigue    Behavior During Therapy  Flat affect       Past Medical History:  Diagnosis Date  . Acute metabolic encephalopathy 6/96/2952  . Aortic valve regurgitation 10/16/2018   Echo 07/28/2017:  EF 60-65, moderate AI, aortic root and ascending aorta mildly dilated (40 mm), ascending aorta 43 mm, MAC, mild MR, mild LAE, moderate RV enlargement, trivial TR // Echo 12/19: EF 60-65, normal wall motion, mild AI, moderate BAE, ascending aorta 43 mm, aortic root 39 mm  . BPH (benign prostatic hyperplasia)   . BPH (benign prostatic hypertrophy)   . Chronic anticoagulation   . Chronic atrial fibrillation   . Diastolic dysfunction October 2010   Normal LV systolic function  . Hx SBO   . Hyperlipidemia   . Hypertension   . Patellar tendon rupture, left, initial encounter 02/10/2019  . Severe sepsis ([place]) 05/05/2019  . Small bowel obstruction [facility])     Past Surgical History:  Procedure Laterality Date  . APPENDECTOMY  2004  . APPLICATION OF WOUND VAC Left 02/11/2019   Procedure: Application Of Wound Vac;  Surgeon: Altamese Winters, MD;  Location: [place];  Service: Orthopedics;  Laterality: Left;  . CARDIOVASCULAR STRESS TEST  09/30/2007   EF 67%  No ischemia.   Marland Kitchen CARDIOVERSION  05/01/2004  . KNEE ARTHROSCOPY Left 02/11/2019   Procedure: ARTHROSCOPY LEFT KNEE;  Surgeon: Altamese Loyola, MD;   Location: [place];  Service: Orthopedics;  Laterality: Left;  . LAPAROTOMY N/A 05/11/2019   Procedure: EXPLORATORY LAPAROTOMY;  Surgeon: Rolm Bookbinder, MD;  Location: [place];  Service: General;  Laterality: N/A;  . LYSIS OF ADHESION N/A 05/11/2019   Procedure: LYSIS OF ADHESION;  Surgeon: Rolm Bookbinder, MD;  Location: [place];  Service: General;  Laterality: N/A;  . PATELLAR TENDON REPAIR Left 02/11/2019   Procedure: LEFT PATELLA TENDON REPAIR;  Surgeon: Altamese Finlayson, MD;  Location: [place];  Service: Orthopedics;  Laterality: Left;  . SMALL INTESTINE SURGERY  2004  . US ECHOCARDIOGRAPHY  09/05/2009   ef 55-60%    There were no vitals filed for this visit.   Subjective Assessment - 07/08/19 1138    Subjective  He used to be able to ambulate no AD needed then He had to have knee surgery on his Lt knee back in april for patella tendon repair and torn meniscus. He then has had declining health and has been hospitalized twice and had recent recent small bowel obstruction with recent surgical intervention with lysis of adhesions. SInce he has become non ambulatory and uses wheelchair for locomotion and gets transferred by lift equipment at home. He denies pain but his Left knee is very painful and sensitive if touched. He has tried HHPT but was not able to progress with this and  now is referred to outpatient PT.    Pertinent History  PMH: recent hospitilization 05/22/19 atrial fibrillation, dementia, essential hypertension, HLD, diastolic congestive heart failure, aortic valve regurgitation, BPH, and recent small bowel obstruction with recent surgical intervention with lysis of adhesions    Limitations  Walking;Standing    How long can you stand comfortably?  can not stand on his own    How long can you walk comfortably?  can not walk on his own    Patient Stated Goals  unable to state, his wife would like to see him walk again    Currently in Pain?  Other (Comment)   no pain unless his Lt knee  gets hit        [facility] PT Assessment - 07/09/19 0001      Assessment   Medical Diagnosis  LE weakness, debility, balance and falls risk    Referring Provider (PT)  Marton Redwood, MD    Onset Date/Surgical Date  --   02/11/19, patella tendon repair surgery   Next MD Visit  unsure    Prior Therapy  HHPT but unable to progress with this      Precautions   Precautions  Fall      Balance Screen   Has the patient fallen in the past 6 months  No   no falls but he has not stood up or walked     [place] residence    Living Arrangements  Spouse/significant other   caregivers     Prior Function   Level of Independence  Needs assistance with ADLs    Vocation  Retired    Leisure  does not Retail banker   Overall Cognitive Status  History of cognitive impairments - at baseline      Observation/Other Assessments   Focus on Therapeutic Outcomes (FOTO)   not appropriate as currently nonambulatory      Sensation   Light Touch  Appears Intact      Strength   Overall Strength Comments  bilat knee strength 3/5, bilat hip strength 3/5, ankle DF 3/5 on Rt and 2/5 on Lt, Lt shoulder strength 4/5, Rt shoulder flexion 2/5, abd 2/5, ER 3/5, IR 4/5      Palpation   Palpation comment  very sensitive to light touch around his Lt knee      Transfers   Transfers  Sit to Stand    Sit to Stand  1: +1 Total assist    Comments  He has fear of falling and needs max encouragement to stand      Ambulation/Gait   Gait Comments  unable      Balance   Balance Assessed  Yes   fair sitting balance, needs total A for standing balance               Objective measurements completed on examination: See above findings.              PT Education - 07/08/19 1703    Education Details  POC, provided illustrated copy of HEP, rationale to transfer to our neuro rehab clinic    Person(s) Educated  Patient;Spouse    Methods   Explanation;Demonstration;Verbal cues;Handout    Comprehension  Verbalized understanding;Need further instruction       PT Short Term Goals - 07/09/19 0816      PT SHORT TERM GOAL #1   Title  Pt will be I and  compliant with initial HEP. (target for STG 6 weeks 10/9)      PT SHORT TERM GOAL #2   Title  Pt will be able to perform sit to stand with mod assist.    Baseline  total    Status  New        PT Long Term Goals - 07/09/19 0818      PT LONG TERM GOAL #1   Title  Pt will be I and compliant with final HEP. (target for all LTG 12 weeks 09/30/19)    Status  New      PT LONG TERM GOAL #2   Title  Pt will be able to perform sit to stand mod I.    Status  New      PT LONG TERM GOAL #3   Title  Pt will be able perform stand pivot transfers min A to decrease caregiver burden.    Status  New      PT LONG TERM GOAL #4   Title  Pt will be able to tolerate standing for 5 min with UE support.      PT LONG TERM GOAL #5   Title  Pt will improve LE strength to at least 4/5 MMT bilat    Baseline  2-3    Status  New             Plan - 07/08/19 1719    Clinical Impression Statement  Pt presents with general leg weakness and debility following Lt knee patella tendon repair on 02/11/19. Since his health has declined and he has been hospitalized twice. He now is wheelchair bound and has not walked in 4 months. He has home aides assist him with transfers using lift at home. He was overall total assist to stand today, and he has high fear of falling. Prognosis is fair at best as he has dementia, has difficulty following commands at times, PT is unsure of his motivation, and he was not able to progress with HHPT. He would beneifit from standing frame or body weight support system at home to encouage safe standing exercise/activity at home.    Personal Factors and Comorbidities  Age;Comorbidity 1;Comorbidity 2;Comorbidity 3+;Past/Current Experience;Fitness    Comorbidities  PMH: recent  hospitilization 05/22/19 atrial fibrillation, dementia, essential hypertension, HLD, diastolic congestive heart failure, aortic valve regurgitation, BPH, and recent small bowel obstruction with recent surgical intervention with lysis of adhesions    Examination-Activity Limitations  --   all ADL's   Examination-Participation Restrictions  --   All   Stability/Clinical Decision Making  Evolving/Moderate complexity    Clinical Decision Making  Moderate    Rehab Potential  Fair    PT Frequency  Other (comment)   2-3   PT Duration  12 weeks    PT Treatment/Interventions  ADLs/Self Care Home Management;Aquatic Therapy;Cryotherapy;Moist Heat;DME Instruction;Gait training;Functional mobility training;Therapeutic activities;Therapeutic exercise;Balance training;Neuromuscular re-education;Manual techniques;Wheelchair mobility training;Patient/family education;Passive range of motion;Taping    PT Next Visit Plan  may need +2 for standing tolerance, transfers, work on leg strength    PT Hosmer, sitting marches, sitting hamstring curls, SLR    Recommended Other Services  may need OT due to Rt shoulder impairments and cognitive deficits.    Consulted and Agree with Plan of Care  Patient;Family member/caregiver    Family Member Consulted  wife       Patient will benefit from skilled therapeutic intervention in order to improve the following deficits and impairments:  Decreased balance, Abnormal gait, Decreased endurance, Decreased range of motion, Decreased strength, Decreased mobility, Difficulty walking, Postural dysfunction, Pain  Visit Diagnosis: Muscle weakness (generalized)  Other abnormalities of gait and mobility  Chronic pain of left knee     Problem List Patient Active Problem List   Diagnosis Date Noted  . Bacteremia due to Enterococcus 05/24/2019  . Urinary tract infection 05/24/2019  . Chronic diastolic CHF (congestive heart failure) ([place]) 05/22/2019  . Gout  05/22/2019  . Sepsis ([place]) 05/22/2019  . Protein-calorie malnutrition, severe 05/18/2019  . AKI (acute kidney injury) ([place]) 05/05/2019  . Transient hypotension 05/05/2019  . Sepsis, Gram negative ([place]) 03/18/2019  . Hypoalbuminemia   . Leukocytosis   . Hyperglycemia   . Persistent atrial fibrillation with RVR   . Acute on chronic anemia   . Rupture of left patellar tendon 02/15/2019  . Postoperative pain   . Bipolar 1 disorder ([place]) 02/14/2019  . Dementia without behavioral disturbance ([place]) 02/14/2019  . Patellar tendon rupture, left, initial encounter 02/10/2019  . SIRS (systemic inflammatory response syndrome) ([place]) 02/09/2019  . Osteomyelitis ([place]) 02/09/2019  . Acute blood loss anemia 02/09/2019  . Aortic valve regurgitation 10/16/2018  . SBO (small bowel obstruction) ([place]) 07/08/2013  . Hyperlipidemia 12/20/2011  . HTN (hypertension) 06/04/2011  . Atrial fibrillation, chronic 03/01/2011  . Long term (current) use of anticoagulants 03/01/2011  . GERD 10/11/2009  . DIVERTICULOSIS OF COLON 10/11/2009    Silvestre Mesi 07/09/2019, 8:27 AM  [facility] [address] [place], [place], [address] Phone: [phone]   Fax:  [phone]  Name: QUINTARIUS FERNS MRN: [id number] Date of Birth: [birth year]

## 2019-07-11 DIAGNOSIS — I351 Nonrheumatic aortic (valve) insufficiency: Secondary | ICD-10-CM | POA: Diagnosis not present

## 2019-07-11 DIAGNOSIS — N390 Urinary tract infection, site not specified: Secondary | ICD-10-CM | POA: Diagnosis not present

## 2019-07-11 DIAGNOSIS — I119 Hypertensive heart disease without heart failure: Secondary | ICD-10-CM | POA: Diagnosis not present

## 2019-07-11 DIAGNOSIS — Z48815 Encounter for surgical aftercare following surgery on the digestive system: Secondary | ICD-10-CM | POA: Diagnosis not present

## 2019-07-11 DIAGNOSIS — I482 Chronic atrial fibrillation, unspecified: Secondary | ICD-10-CM | POA: Diagnosis not present

## 2019-07-11 DIAGNOSIS — Z4801 Encounter for change or removal of surgical wound dressing: Secondary | ICD-10-CM | POA: Diagnosis not present

## 2019-07-12 ENCOUNTER — Ambulatory Visit: Payer: Medicare Other | Admitting: Physical Therapy

## 2019-07-14 ENCOUNTER — Ambulatory Visit: Payer: Medicare Other | Attending: Internal Medicine | Admitting: Physical Therapy

## 2019-07-14 ENCOUNTER — Other Ambulatory Visit: Payer: Self-pay

## 2019-07-14 DIAGNOSIS — M6281 Muscle weakness (generalized): Secondary | ICD-10-CM | POA: Insufficient documentation

## 2019-07-14 DIAGNOSIS — R2689 Other abnormalities of gait and mobility: Secondary | ICD-10-CM

## 2019-07-14 DIAGNOSIS — G8929 Other chronic pain: Secondary | ICD-10-CM | POA: Insufficient documentation

## 2019-07-14 DIAGNOSIS — M25562 Pain in left knee: Secondary | ICD-10-CM | POA: Insufficient documentation

## 2019-07-14 NOTE — Therapy (Signed)
[facility] [address] [place], [place], [address] Phone: [phone]   Fax:  [phone]  Physical Therapy Treatment  Patient Details  Name: Russell Dillon MRN: [id number] Date of Birth: [birth year] Referring Provider (PT): Marton Redwood, MD   Encounter Date: 07/14/2019  PT End of Session - 07/14/19 1731    Visit Number  2    Number of Visits  24    Date for PT Re-Evaluation  09/30/19    Authorization Type  MCR, AARP    PT Start Time  6644    PT Stop Time  1615    PT Time Calculation (min)  45 min    Activity Tolerance  Patient limited by fatigue    Behavior During Therapy  Flat affect       Past Medical History:  Diagnosis Date  . Acute metabolic encephalopathy 0/34/7425  . Aortic valve regurgitation 10/16/2018   Echo 07/28/2017:  EF 60-65, moderate AI, aortic root and ascending aorta mildly dilated (40 mm), ascending aorta 43 mm, MAC, mild MR, mild LAE, moderate RV enlargement, trivial TR // Echo 12/19: EF 60-65, normal wall motion, mild AI, moderate BAE, ascending aorta 43 mm, aortic root 39 mm  . BPH (benign prostatic hyperplasia)   . BPH (benign prostatic hypertrophy)   . Chronic anticoagulation   . Chronic atrial fibrillation   . Diastolic dysfunction October 2010   Normal LV systolic function  . Hx SBO   . Hyperlipidemia   . Hypertension   . Patellar tendon rupture, left, initial encounter 02/10/2019  . Severe sepsis ([place]) 05/05/2019  . Small bowel obstruction [facility])     Past Surgical History:  Procedure Laterality Date  . APPENDECTOMY  2004  . APPLICATION OF WOUND VAC Left 02/11/2019   Procedure: Application Of Wound Vac;  Surgeon: Altamese Shawnee, MD;  Location: [facility];  Service: Orthopedics;  Laterality: Left;  . CARDIOVASCULAR STRESS TEST  09/30/2007   EF 67%  No ischemia.   Marland Kitchen CARDIOVERSION  05/01/2004  . KNEE ARTHROSCOPY Left 02/11/2019   Procedure: ARTHROSCOPY LEFT KNEE;  Surgeon: Altamese Louisa,  MD;  Location: [facility];  Service: Orthopedics;  Laterality: Left;  . LAPAROTOMY N/A 05/11/2019   Procedure: EXPLORATORY LAPAROTOMY;  Surgeon: Rolm Bookbinder, MD;  Location: [facility] [place];  Service: General;  Laterality: N/A;  . LYSIS OF ADHESION N/A 05/11/2019   Procedure: LYSIS OF ADHESION;  Surgeon: Rolm Bookbinder, MD;  Location: [facility];  Service: General;  Laterality: N/A;  . PATELLAR TENDON REPAIR Left 02/11/2019   Procedure: LEFT PATELLA TENDON REPAIR;  Surgeon: Altamese Terril, MD;  Location: [facility];  Service: Orthopedics;  Laterality: Left;  . SMALL INTESTINE SURGERY  2004  . US ECHOCARDIOGRAPHY  09/05/2009   ef 55-60%    There were no vitals filed for this visit.  Subjective Assessment - 07/14/19 1730    Subjective  He relays no complaints today has been trying his HEP at home. when asked about standing up in parallel bars he says "I dont know if I want to"    Pertinent History  PMH: recent hospitilization 05/22/19 atrial fibrillation, dementia, essential hypertension, HLD, diastolic congestive heart failure, aortic valve regurgitation, BPH, and recent small bowel obstruction with recent surgical intervention with lysis of adhesions    Limitations  Walking;Standing    How long can you stand comfortably?  can not stand on his own    How long can you walk comfortably?  can not walk on his  own    Patient Stated Goals  unable to state, his wife would like to see him walk again       Treatment/Interventions  Sci fit in wheelchair 5 min LE/UE  Sit to stands X 5 reps total with rest break each time in parallel bars max A and encouragement able to stand 5-10 seconds each time but unable to lock out his knees or come up into erect posture instead stays in flexed posture.   Sitting marches and LAQ with 2 lb weights  X20 each, sitting heel toe raises X 20 ea (does not lift Lt leg as high)    PT Short Term Goals - 07/09/19 0816      PT SHORT TERM GOAL #1   Title  Pt will be I and compliant  with initial HEP. (target for STG 6 weeks 10/9)      PT SHORT TERM GOAL #2   Title  Pt will be able to perform sit to stand with mod assist.    Baseline  total    Status  New        PT Long Term Goals - 07/09/19 0818      PT LONG TERM GOAL #1   Title  Pt will be I and compliant with final HEP. (target for all LTG 12 weeks 09/30/19)    Status  New      PT LONG TERM GOAL #2   Title  Pt will be able to perform sit to stand mod I.    Status  New      PT LONG TERM GOAL #3   Title  Pt will be able perform stand pivot transfers min A to decrease caregiver burden.    Status  New      PT LONG TERM GOAL #4   Title  Pt will be able to tolerate standing for 5 min with UE support.      PT LONG TERM GOAL #5   Title  Pt will improve LE strength to at least 4/5 MMT bilat    Baseline  2-3    Status  New            Plan - 07/14/19 1732    Clinical Impression Statement  Pt needed max encouragement for session but then warmed up to exercises and activity. He remains very fearful to stand at first and is max A to stand so moved him into parrallel bars where he could help pull with his arms and support with his arms which allowed him to stand between 5-10 seconds although he does not fully come upright and stays in flexed posture unable to lockout his knees. After 3 reps he at least leaned forward better off of the back of chair in order to get in better position for sit to stand. PT will continue to progress standing as able and may move to bodyweight support or standing frame when appropriate.    Personal Factors and Comorbidities  Age;Comorbidity 1;Comorbidity 2;Comorbidity 3+;Past/Current Experience;Fitness    Comorbidities  PMH: recent hospitilization 05/22/19 atrial fibrillation, dementia, essential hypertension, HLD, diastolic congestive heart failure, aortic valve regurgitation, BPH, and recent small bowel obstruction with recent surgical intervention with lysis of adhesions     Examination-Activity Limitations  --   all ADL's   Examination-Participation Restrictions  --   All   Stability/Clinical Decision Making  Evolving/Moderate complexity    Rehab Potential  Fair    PT Frequency  Other (comment)   2-3  PT Duration  12 weeks    PT Treatment/Interventions  ADLs/Self Care Home Management;Aquatic Therapy;Cryotherapy;Moist Heat;DME Instruction;Gait training;Functional mobility training;Therapeutic activities;Therapeutic exercise;Balance training;Neuromuscular re-education;Manual techniques;Wheelchair mobility training;Patient/family education;Passive range of motion;Taping    PT Next Visit Plan  may need +2 for standing tolerance, transfers, work on leg strength    PT [place], sitting marches, sitting hamstring curls, SLR    Consulted and Agree with Plan of Care  Patient;Family member/caregiver    Family Member Consulted  wife       Patient will benefit from skilled therapeutic intervention in order to improve the following deficits and impairments:  Decreased balance, Abnormal gait, Decreased endurance, Decreased range of motion, Decreased strength, Decreased mobility, Difficulty walking, Postural dysfunction, Pain  Visit Diagnosis: Muscle weakness (generalized)  Other abnormalities of gait and mobility  Chronic pain of left knee     Problem List Patient Active Problem List   Diagnosis Date Noted  . Bacteremia due to Enterococcus 05/24/2019  . Urinary tract infection 05/24/2019  . Chronic diastolic CHF (congestive heart failure) ([place]) 05/22/2019  . Gout 05/22/2019  . Sepsis ([place]) 05/22/2019  . Protein-calorie malnutrition, severe 05/18/2019  . AKI (acute kidney injury) ([place]) 05/05/2019  . Transient hypotension 05/05/2019  . Sepsis, Gram negative ([place]) 03/18/2019  . Hypoalbuminemia   . Leukocytosis   . Hyperglycemia   . Persistent atrial fibrillation with RVR   . Acute on chronic anemia   . Rupture of left patellar tendon  02/15/2019  . Postoperative pain   . Bipolar 1 disorder ([place]) 02/14/2019  . Dementia without behavioral disturbance ([place]) 02/14/2019  . Patellar tendon rupture, left, initial encounter 02/10/2019  . SIRS (systemic inflammatory response syndrome) ([place]) 02/09/2019  . Osteomyelitis ([place]) 02/09/2019  . Acute blood loss anemia 02/09/2019  . Aortic valve regurgitation 10/16/2018  . SBO (small bowel obstruction) ([place]) 07/08/2013  . Hyperlipidemia 12/20/2011  . HTN (hypertension) 06/04/2011  . Atrial fibrillation, chronic 03/01/2011  . Long term (current) use of anticoagulants 03/01/2011  . GERD 10/11/2009  . DIVERTICULOSIS OF COLON 10/11/2009    Silvestre Mesi 07/14/2019, 5:41 PM  [facility] [address] [place], [place], [address] Phone: [phone]   Fax:  [phone]  Name: Russell Dillon MRN: [id number] Date of Birth: Sep 14, 1946

## 2019-07-16 ENCOUNTER — Other Ambulatory Visit: Payer: Self-pay

## 2019-07-16 ENCOUNTER — Encounter: Payer: Self-pay | Admitting: Physical Therapy

## 2019-07-16 ENCOUNTER — Ambulatory Visit: Payer: Medicare Other | Admitting: Physical Therapy

## 2019-07-16 DIAGNOSIS — M25562 Pain in left knee: Secondary | ICD-10-CM

## 2019-07-16 DIAGNOSIS — G8929 Other chronic pain: Secondary | ICD-10-CM | POA: Diagnosis not present

## 2019-07-16 DIAGNOSIS — M6281 Muscle weakness (generalized): Secondary | ICD-10-CM | POA: Diagnosis not present

## 2019-07-16 DIAGNOSIS — R2689 Other abnormalities of gait and mobility: Secondary | ICD-10-CM

## 2019-07-17 NOTE — Therapy (Signed)
[facility] [address] [place], [place], [address] Phone: [phone]   Fax:  [phone]  Physical Therapy Treatment  Patient Details  Name: Russell Dillon MRN: [id number] Date of Birth: 02-11-1946 Referring Provider (PT): Marton Redwood, MD   Encounter Date: 07/16/2019  PT End of Session - 07/16/19 1452    Visit Number  3    Number of Visits  24    Date for PT Re-Evaluation  09/30/19    Authorization Type  MCR, AARP    PT Start Time  5956    PT Stop Time  1530    PT Time Calculation (min)  43 min    Activity Tolerance  Patient limited by fatigue    Behavior During Therapy  Flat affect       Past Medical History:  Diagnosis Date  . Acute metabolic encephalopathy 3/87/5643  . Aortic valve regurgitation 10/16/2018   Echo 07/28/2017:  EF 60-65, moderate AI, aortic root and ascending aorta mildly dilated (40 mm), ascending aorta 43 mm, MAC, mild MR, mild LAE, moderate RV enlargement, trivial TR // Echo 12/19: EF 60-65, normal wall motion, mild AI, moderate BAE, ascending aorta 43 mm, aortic root 39 mm  . BPH (benign prostatic hyperplasia)   . BPH (benign prostatic hypertrophy)   . Chronic anticoagulation   . Chronic atrial fibrillation   . Diastolic dysfunction October 2010   Normal LV systolic function  . Hx SBO   . Hyperlipidemia   . Hypertension   . Patellar tendon rupture, left, initial encounter 02/10/2019  . Severe sepsis ([place]) 05/05/2019  . Small bowel obstruction [facility])     Past Surgical History:  Procedure Laterality Date  . APPENDECTOMY  2004  . APPLICATION OF WOUND VAC Left 02/11/2019   Procedure: Application Of Wound Vac;  Surgeon: Altamese Norman, MD;  Location: [facility];  Service: Orthopedics;  Laterality: Left;  . CARDIOVASCULAR STRESS TEST  09/30/2007   EF 67%  No ischemia.   Marland Kitchen CARDIOVERSION  05/01/2004  . KNEE ARTHROSCOPY Left 02/11/2019   Procedure: ARTHROSCOPY LEFT KNEE;  Surgeon: Altamese  Chapel,  MD;  Location: [facility];  Service: Orthopedics;  Laterality: Left;  . LAPAROTOMY N/A 05/11/2019   Procedure: EXPLORATORY LAPAROTOMY;  Surgeon: Rolm Bookbinder, MD;  Location: [facility];  Service: General;  Laterality: N/A;  . LYSIS OF ADHESION N/A 05/11/2019   Procedure: LYSIS OF ADHESION;  Surgeon: Rolm Bookbinder, MD;  Location: [facility];  Service: General;  Laterality: N/A;  . PATELLAR TENDON REPAIR Left 02/11/2019   Procedure: LEFT PATELLA TENDON REPAIR;  Surgeon: Altamese [place], MD;  Location: [facility];  Service: Orthopedics;  Laterality: Left;  . SMALL INTESTINE SURGERY  2004  . US ECHOCARDIOGRAPHY  09/05/2009   ef 55-60%    There were no vitals filed for this visit.  Subjective Assessment - 07/16/19 1450    Subjective  No new compliants. No falls. Spouse reports she has received the ankle weights and plans to start using them.    Pertinent History  PMH: recent hospitilization 05/22/19 atrial fibrillation, dementia, essential hypertension, HLD, diastolic congestive heart failure, aortic valve regurgitation, BPH, and recent small bowel obstruction with recent surgical intervention with lysis of adhesions    Limitations  Walking;Standing    How long can you stand comfortably?  can not stand on his own    Patient Stated Goals  unable to state, his wife would like to see him walk again    Currently in Pain?  Other (Comment)   "my usual". spouse reports the left knee if it's touched.         [facility] Adult PT Treatment/Exercise - 07/16/19 1550      Transfers   Transfers  Sit to Stand;Stand to Sit;Lateral/Scoot Transfers    Sit to Stand  From bed;Other (comment)   use of standing frame with 2 person assist   Stand to Sit  To bed;Other (comment)   with standing frame with 2 person assist   Lateral/Scoot Transfers  3: Mod assist;With slide board   mod/max assist of 2 for safety   Lateral/Scoot Transfer Details (indicate cue type and reason)  total assist to place board with mod assist/max cues  for pt to weight shift laterally so board could be placed. 2 person assist for lateral scooting along board with cues to pt to use UE's and for anterior weight shifitng. used to transfer wheelchair<>mat table    Number of Reps  Other reps (comment)   2 reps   Comments  used standing frame with pad at pt's pelvis. cues to reach UE's up onto it to promote anterior weight shifting with standing. 2 person assist for safety with using lift.       Therapeutic Activites    Therapeutic Activities  Other Therapeutic Activities    Other Therapeutic Activities  with use of standing frame: pt stood x2 reps for 1-2 minutes each in duration working on trunk extension and cervical extension with max cues/encouragement needed.       Neuro Re-ed    Neuro Re-ed Details   for balance/muscle re-ed/strengthening: seated at edge of mat with inverted chair behind him with pillow on top- modified curl ups for 10 reps with bil UE assist/cues on technique; with pt's' hands on PTA shoulders pt was cued to "push me away", pt did so for 5 reps with cues/facilitaiton for increased anterior weight shifting.            PT Short Term Goals - 07/09/19 0816      PT SHORT TERM GOAL #1   Title  Pt will be I and compliant with initial HEP. (target for STG 6 weeks 10/9)      PT SHORT TERM GOAL #2   Title  Pt will be able to perform sit to stand with mod assist.    Baseline  total    Status  New        PT Long Term Goals - 07/09/19 0818      PT LONG TERM GOAL #1   Title  Pt will be I and compliant with final HEP. (target for all LTG 12 weeks 09/30/19)    Status  New      PT LONG TERM GOAL #2   Title  Pt will be able to perform sit to stand mod I.    Status  New      PT LONG TERM GOAL #3   Title  Pt will be able perform stand pivot transfers min A to decrease caregiver burden.    Status  New      PT LONG TERM GOAL #4   Title  Pt will be able to tolerate standing for 5 min with UE support.      PT LONG TERM  GOAL #5   Title  Pt will improve LE strength to at least 4/5 MMT bilat    Baseline  2-3    Status  New  Plan - 07/16/19 1452    Clinical Impression Statement  Today's skilled session focused on return of slide board transfers, unsupported sitting balance and initated use of standing frame to work on standing tolerance. Pt continues to need encouragement to participate, however did every task that was asked of him with assistance. Spouse inquiring about whether they should return to slide board transfer at home, advised her "no" at this time. He requires 2 person assist for safety and needs further training in therapy before doing these at home. Pt did tolerate the standing frame for about 2 minutes each time with very flexed posture despite max cues for trunk/cervical extension. He presents this way seated too. The pt will benefit from postural strengthening and stretching to allow him to get more upright with both sitting and standing.    Personal Factors and Comorbidities  Age;Comorbidity 1;Comorbidity 2;Comorbidity 3+;Past/Current Experience;Fitness    Comorbidities  PMH: recent hospitilization 05/22/19 atrial fibrillation, dementia, essential hypertension, HLD, diastolic congestive heart failure, aortic valve regurgitation, BPH, and recent small bowel obstruction with recent surgical intervention with lysis of adhesions    Examination-Activity Limitations  --   all ADL's   Examination-Participation Restrictions  --   All   Stability/Clinical Decision Making  Evolving/Moderate complexity    Rehab Potential  Fair    PT Frequency  Other (comment)   2-3   PT Duration  12 weeks    PT Treatment/Interventions  ADLs/Self Care Home Management;Aquatic Therapy;Cryotherapy;Moist Heat;DME Instruction;Gait training;Functional mobility training;Therapeutic activities;Therapeutic exercise;Balance training;Neuromuscular re-education;Manual techniques;Wheelchair mobility training;Patient/family  education;Passive range of motion;Taping    PT Next Visit Plan  address postural strengthening, postural stretching, LE strengtheing, unsupported balance with emphasis on anterior weight shifting, slide board transfers and standing frame    PT Home Exercise Plan  LAQ, sitting marches, sitting hamstring curls, SLR    Consulted and Agree with Plan of Care  Patient;Family member/caregiver    Family Member Consulted  wife       Patient will benefit from skilled therapeutic intervention in order to improve the following deficits and impairments:  Decreased balance, Abnormal gait, Decreased endurance, Decreased range of motion, Decreased strength, Decreased mobility, Difficulty walking, Postural dysfunction, Pain  Visit Diagnosis: Muscle weakness (generalized)  Other abnormalities of gait and mobility  Chronic pain of left knee     Problem List Patient Active Problem List   Diagnosis Date Noted  . Bacteremia due to Enterococcus 05/24/2019  . Urinary tract infection 05/24/2019  . Chronic diastolic CHF (congestive heart failure) ([place]) 05/22/2019  . Gout 05/22/2019  . Sepsis ([place]) 05/22/2019  . Protein-calorie malnutrition, severe 05/18/2019  . AKI (acute kidney injury) ([place]) 05/05/2019  . Transient hypotension 05/05/2019  . Sepsis, Gram negative ([place]) 03/18/2019  . Hypoalbuminemia   . Leukocytosis   . Hyperglycemia   . Persistent atrial fibrillation with RVR   . Acute on chronic anemia   . Rupture of left patellar tendon 02/15/2019  . Postoperative pain   . Bipolar 1 disorder ([place]) 02/14/2019  . Dementia without behavioral disturbance ([place]) 02/14/2019  . Patellar tendon rupture, left, initial encounter 02/10/2019  . SIRS (systemic inflammatory response syndrome) ([place]) 02/09/2019  . Osteomyelitis ([place]) 02/09/2019  . Acute blood loss anemia 02/09/2019  . Aortic valve regurgitation 10/16/2018  . SBO (small bowel obstruction) ([place]) 07/08/2013  . Hyperlipidemia 12/20/2011  . HTN  (hypertension) 06/04/2011  . Atrial fibrillation, chronic 03/01/2011  . Long term (current) use of anticoagulants 03/01/2011  . GERD 10/11/2009  . DIVERTICULOSIS OF  COLON 10/11/2009    Willow Ora, PTA, [facility] [address], [place], [place] [address] [phone] 07/17/19, 2:15 PM   Name: Russell Dillon MRN: [id number] Date of Birth: September 25, 1946

## 2019-07-21 ENCOUNTER — Encounter: Payer: Self-pay | Admitting: Physical Therapy

## 2019-07-21 ENCOUNTER — Ambulatory Visit: Payer: Medicare Other | Admitting: Physical Therapy

## 2019-07-21 ENCOUNTER — Other Ambulatory Visit: Payer: Self-pay

## 2019-07-21 DIAGNOSIS — R2689 Other abnormalities of gait and mobility: Secondary | ICD-10-CM | POA: Diagnosis not present

## 2019-07-21 DIAGNOSIS — M25562 Pain in left knee: Secondary | ICD-10-CM

## 2019-07-21 DIAGNOSIS — G8929 Other chronic pain: Secondary | ICD-10-CM

## 2019-07-21 DIAGNOSIS — M6281 Muscle weakness (generalized): Secondary | ICD-10-CM | POA: Diagnosis not present

## 2019-07-22 NOTE — Therapy (Signed)
[facility] [address] [place], [place], [address] Phone: [phone]   Fax:  [phone]  Physical Therapy Treatment  Patient Details  Name: Russell Dillon MRN: [id number] Date of Birth: [birth year] Referring Provider (PT): Marton Redwood, MD   Encounter Date: 07/21/2019  PT End of Session - 07/21/19 1540    Visit Number  4    Number of Visits  24    Date for PT Re-Evaluation  09/30/19    Authorization Type  MCR, AARP    PT Start Time  1534   late due to transportation   PT Stop Time  1616    PT Time Calculation (min)  42 min    Equipment Utilized During Treatment  --   pt refused gait belt, got agitated if topic pushed   Activity Tolerance  Patient limited by fatigue;Other (comment);Treatment limited secondary to agitation   pt intermittently particpating, mostly stated "i'm not doing that".   Behavior During Therapy  Flat affect       Past Medical History:  Diagnosis Date  . Acute metabolic encephalopathy 03/15/1832  . Aortic valve regurgitation 10/16/2018   Echo 07/28/2017:  EF 60-65, moderate AI, aortic root and ascending aorta mildly dilated (40 mm), ascending aorta 43 mm, MAC, mild MR, mild LAE, moderate RV enlargement, trivial TR // Echo 12/19: EF 60-65, normal wall motion, mild AI, moderate BAE, ascending aorta 43 mm, aortic root 39 mm  . BPH (benign prostatic hyperplasia)   . BPH (benign prostatic hypertrophy)   . Chronic anticoagulation   . Chronic atrial fibrillation   . Diastolic dysfunction October 2010   Normal LV systolic function  . Hx SBO   . Hyperlipidemia   . Hypertension   . Patellar tendon rupture, left, initial encounter 02/10/2019  . Severe sepsis ([place]) 05/05/2019  . Small bowel obstruction [facility])     Past Surgical History:  Procedure Laterality Date  . APPENDECTOMY  2004  . APPLICATION OF WOUND VAC Left 02/11/2019   Procedure: Application Of Wound Vac;  Surgeon: Altamese Posey, MD;   Location: [facility];  Service: Orthopedics;  Laterality: Left;  . CARDIOVASCULAR STRESS TEST  09/30/2007   EF 67%  No ischemia.   Marland Kitchen CARDIOVERSION  05/01/2004  . KNEE ARTHROSCOPY Left 02/11/2019   Procedure: ARTHROSCOPY LEFT KNEE;  Surgeon: Altamese [place], MD;  Location: [facility];  Service: Orthopedics;  Laterality: Left;  . LAPAROTOMY N/A 05/11/2019   Procedure: EXPLORATORY LAPAROTOMY;  Surgeon: Rolm Bookbinder, MD;  Location: [facility];  Service: General;  Laterality: N/A;  . LYSIS OF ADHESION N/A 05/11/2019   Procedure: LYSIS OF ADHESION;  Surgeon: Rolm Bookbinder, MD;  Location: [facility];  Service: General;  Laterality: N/A;  . PATELLAR TENDON REPAIR Left 02/11/2019   Procedure: LEFT PATELLA TENDON REPAIR;  Surgeon: Altamese Keystone, MD;  Location: [facility];  Service: Orthopedics;  Laterality: Left;  . SMALL INTESTINE SURGERY  2004  . US ECHOCARDIOGRAPHY  09/05/2009   ef 55-60%    There were no vitals filed for this visit.  Subjective Assessment - 07/21/19 1539    Subjective  Was tired after session, did not report any pain. No complaints today. Doing the exercises at home when spouse catches him in a good mood.    Pertinent History  PMH: recent hospitilization 05/22/19 atrial fibrillation, dementia, essential hypertension, HLD, diastolic congestive heart failure, aortic valve regurgitation, BPH, and recent small bowel obstruction with recent surgical intervention with lysis of adhesions  Limitations  Walking;Standing    How long can you stand comfortably?  can not stand on his own    How long can you walk comfortably?  can not walk on his own    Patient Stated Goals  unable to state, his wife would like to see him walk again    Currently in Pain?  No/denies    Pain Score  0-No pain           [facility] Adult PT Treatment/Exercise - 07/21/19 1541      Transfers   Transfers  Lateral/Scoot Transfers    Lateral/Scoot Transfers  4: Min assist;4: Min guard    Research officer, trade [employer] Details (indicate cue  type and reason)  2 person min assist for wheelchair to mat table with one person on stand by/stabilizing wheelchair. with increased time pt able to use arms/LE's to scoot self across board. needed time due to delayed initiation once cues. at end of session secured the wheelchair with wooded wedges and transfered from mat to wheelchair with one person min guard to min assist. cues needed to stay on task and keep scooting. pt deciding half way that he was "good there", cued pt he needed to keep going to chair for safety and to be able to go home. Pt then continued with transfer with cues and increased time.       Neuro Re-ed    Neuro Re-ed Details   for unsupported balance/muscle re-ed/strengthening: worked on getting pt to shift weight more anterior wtih "push me away", pt performed 3 reps then layed back on inverted chair/pillow behind him saying "I'm not doing that". Switched to working on partial curl ups with pt doing 3-4 reps with assist then refusing to do more. with pt supported on inverted chair with pillow had him work on cervical extension by "looking up". cues needed for this, then PTA came behind pt/chair to work on shoulder retraction/chest stretching passivley. pt at this time refusing more activity therefore worked on [employer] transfer back to wheelchair.                   Knee/Hip Exercises: Aerobic   Other Aerobic  Scifit level 1.5x 5 minutes with goal to keep moving using bil UE/LE's. pt needing to stop to readjust right hand 3 times, otherwise kept machine moving with cues.                PT Short Term Goals - 07/09/19 0816      PT SHORT TERM GOAL #1   Title  Pt will be I and compliant with initial HEP. (target for STG 6 weeks 10/9)      PT SHORT TERM GOAL #2   Title  Pt will be able to perform sit to stand with mod assist.    Baseline  total    Status  New        PT Long Term Goals - 07/09/19 0818      PT LONG TERM GOAL #1   Title  Pt will be I and compliant  with final HEP. (target for all LTG 12 weeks 09/30/19)    Status  New      PT LONG TERM GOAL #2   Title  Pt will be able to perform sit to stand mod I.    Status  New      PT LONG TERM GOAL #3   Title  Pt will be able perform stand pivot transfers min A to decrease  caregiver burden.    Status  New      PT LONG TERM GOAL #4   Title  Pt will be able to tolerate standing for 5 min with UE support.      PT LONG TERM GOAL #5   Title  Pt will improve LE strength to at least 4/5 MMT bilat    Baseline  2-3    Status  New            Plan - 07/21/19 1540    Clinical Impression Statement  Today's skilled session continued to focus on strengthening and transfers. Pt fully particatory with use of Scifit for strengthening and assisted with slide board transfer to mat table. However once seated edge of mat pt would do a few reps, if any, of exercises/tasks before stating "I'm not doing that". If pressed or encouraged to keep participating the pt would get mildly agitated. Needed max encouragement for slide board back to wheelchair, only assisting when informed he was done once he got to the chair. Spouse present and supported. Stated that he has "bad days" at times. PT to continue to progress pt toward goals as able.    Personal Factors and Comorbidities  Age;Comorbidity 1;Comorbidity 2;Comorbidity 3+;Past/Current Experience;Fitness    Comorbidities  PMH: recent hospitilization 05/22/19 atrial fibrillation, dementia, essential hypertension, HLD, diastolic congestive heart failure, aortic valve regurgitation, BPH, and recent small bowel obstruction with recent surgical intervention with lysis of adhesions    Examination-Activity Limitations  --   all ADL's   Examination-Participation Restrictions  --   All   Stability/Clinical Decision Making  Evolving/Moderate complexity    Rehab Potential  Fair    PT Frequency  Other (comment)   2-3   PT Duration  12 weeks    PT Treatment/Interventions   ADLs/Self Care Home Management;Aquatic Therapy;Cryotherapy;Moist Heat;DME Instruction;Gait training;Functional mobility training;Therapeutic activities;Therapeutic exercise;Balance training;Neuromuscular re-education;Manual techniques;Wheelchair mobility training;Patient/family education;Passive range of motion;Taping    PT Next Visit Plan  address postural strengthening, postural stretching, LE strengtheing, unsupported balance with emphasis on anterior weight shifting, slide board transfers and standing frame    PT Home Exercise Plan  LAQ, sitting marches, sitting hamstring curls, SLR    Consulted and Agree with Plan of Care  Patient;Family member/caregiver    Family Member Consulted  wife       Patient will benefit from skilled therapeutic intervention in order to improve the following deficits and impairments:  Decreased balance, Abnormal gait, Decreased endurance, Decreased range of motion, Decreased strength, Decreased mobility, Difficulty walking, Postural dysfunction, Pain  Visit Diagnosis: Muscle weakness (generalized)  Other abnormalities of gait and mobility  Chronic pain of left knee     Problem List Patient Active Problem List   Diagnosis Date Noted  . Bacteremia due to Enterococcus 05/24/2019  . Urinary tract infection 05/24/2019  . Chronic diastolic CHF (congestive heart failure) ([place]) 05/22/2019  . Gout 05/22/2019  . Sepsis ([place] [facility]) 05/22/2019  . Protein-calorie malnutrition, severe 05/18/2019  . AKI (acute kidney injury) ([facility]) 05/05/2019  . Transient hypotension 05/05/2019  . Sepsis, Gram negative ([facility]) 03/18/2019  . Hypoalbuminemia   . Leukocytosis   . Hyperglycemia   . Persistent atrial fibrillation with RVR   . Acute on chronic anemia   . Rupture of left patellar tendon 02/15/2019  . Postoperative pain   . Bipolar 1 disorder ([facility]) 02/14/2019  . Dementia without behavioral disturbance ([facility]) 02/14/2019  . Patellar tendon rupture, left, initial encounter  02/10/2019  . SIRS (systemic inflammatory  response syndrome) ([place] [place]) 02/09/2019  . Osteomyelitis ([facility]) 02/09/2019  . Acute blood loss anemia 02/09/2019  . Aortic valve regurgitation 10/16/2018  . SBO (small bowel obstruction) ([facility]) 07/08/2013  . Hyperlipidemia 12/20/2011  . HTN (hypertension) 06/04/2011  . Atrial fibrillation, chronic 03/01/2011  . Long term (current) use of anticoagulants 03/01/2011  . GERD 10/11/2009  . DIVERTICULOSIS OF COLON 10/11/2009    Willow Ora, PTA, [facility] [address] [place], [place] [address] [phone] 07/22/19, 9:37 PM   Name: Russell Dillon MRN: [id number] Date of Birth: 10-01-1946

## 2019-07-23 DIAGNOSIS — Z23 Encounter for immunization: Secondary | ICD-10-CM | POA: Diagnosis not present

## 2019-07-23 DIAGNOSIS — I480 Paroxysmal atrial fibrillation: Secondary | ICD-10-CM | POA: Diagnosis not present

## 2019-07-23 DIAGNOSIS — Z7901 Long term (current) use of anticoagulants: Secondary | ICD-10-CM | POA: Diagnosis not present

## 2019-07-26 ENCOUNTER — Ambulatory Visit: Payer: Medicare Other | Admitting: Physical Therapy

## 2019-07-26 ENCOUNTER — Other Ambulatory Visit: Payer: Self-pay

## 2019-07-26 DIAGNOSIS — M25562 Pain in left knee: Secondary | ICD-10-CM | POA: Diagnosis not present

## 2019-07-26 DIAGNOSIS — M6281 Muscle weakness (generalized): Secondary | ICD-10-CM | POA: Diagnosis not present

## 2019-07-26 DIAGNOSIS — G8929 Other chronic pain: Secondary | ICD-10-CM

## 2019-07-26 DIAGNOSIS — R2689 Other abnormalities of gait and mobility: Secondary | ICD-10-CM

## 2019-07-26 NOTE — Therapy (Signed)
[facility] [address] [place], [place], [address] Phone: [phone]   Fax:  [phone]  Physical Therapy Treatment  Patient Details  Name: Russell Dillon MRN: [id number] Date of Birth: [birth year] Referring Provider (PT): Marton Redwood, MD   Encounter Date: 07/26/2019  PT End of Session - 07/26/19 2116    Visit Number  5    Number of Visits  24    Date for PT Re-Evaluation  09/30/19    Authorization Type  MCR, AARP    PT Start Time  1545   late due to transportation   PT Stop Time  1625    PT Time Calculation (min)  40 min    Equipment Utilized During Treatment  --   pt refused gait belt, got agitated if topic pushed   Activity Tolerance  Patient limited by fatigue;Treatment limited secondary to agitation;Patient limited by pain   pt intermittently particpating, mostly stated "i'm not doing that".   Behavior During Therapy  Flat affect;Agitated       Past Medical History:  Diagnosis Date  . Acute metabolic encephalopathy 3/41/9622  . Aortic valve regurgitation 10/16/2018   Echo 07/28/2017:  EF 60-65, moderate AI, aortic root and ascending aorta mildly dilated (40 mm), ascending aorta 43 mm, MAC, mild MR, mild LAE, moderate RV enlargement, trivial TR // Echo 12/19: EF 60-65, normal wall motion, mild AI, moderate BAE, ascending aorta 43 mm, aortic root 39 mm  . BPH (benign prostatic hyperplasia)   . BPH (benign prostatic hypertrophy)   . Chronic anticoagulation   . Chronic atrial fibrillation   . Diastolic dysfunction October 2010   Normal LV systolic function  . Hx SBO   . Hyperlipidemia   . Hypertension   . Patellar tendon rupture, left, initial encounter 02/10/2019  . Severe sepsis ([facility]) 05/05/2019  . Small bowel obstruction [facility])     Past Surgical History:  Procedure Laterality Date  . APPENDECTOMY  2004  . APPLICATION OF WOUND VAC Left 02/11/2019   Procedure: Application Of Wound Vac;  Surgeon:  Altamese Lipan, MD;  Location: [facility];  Service: Orthopedics;  Laterality: Left;  . CARDIOVASCULAR STRESS TEST  09/30/2007   EF 67%  No ischemia.   Marland Kitchen CARDIOVERSION  05/01/2004  . KNEE ARTHROSCOPY Left 02/11/2019   Procedure: ARTHROSCOPY LEFT KNEE;  Surgeon: Altamese [place]lake, MD;  Location: [facility];  Service: Orthopedics;  Laterality: Left;  . LAPAROTOMY N/A 05/11/2019   Procedure: EXPLORATORY LAPAROTOMY;  Surgeon: Rolm Bookbinder, MD;  Location: [facility];  Service: General;  Laterality: N/A;  . LYSIS OF ADHESION N/A 05/11/2019   Procedure: LYSIS OF ADHESION;  Surgeon: Rolm Bookbinder, MD;  Location: [facility];  Service: General;  Laterality: N/A;  . PATELLAR TENDON REPAIR Left 02/11/2019   Procedure: LEFT PATELLA TENDON REPAIR;  Surgeon: Altamese Crosby, MD;  Location: [facility];  Service: Orthopedics;  Laterality: Left;  . SMALL INTESTINE SURGERY  2004  . US ECHOCARDIOGRAPHY  09/05/2009   ef 55-60%    There were no vitals filed for this visit.  Subjective Assessment - 07/26/19 2115    Subjective  " I dont know how I feel, I'm here I guess" Appears to have some knee pain but does not rate    Pertinent History  PMH: recent hospitilization 05/22/19 atrial fibrillation, dementia, essential hypertension, HLD, diastolic congestive heart failure, aortic valve regurgitation, BPH, and recent small bowel obstruction with recent surgical intervention with lysis of adhesions    Limitations  Walking;Standing    How long can you stand comfortably?  can not stand on his own    How long can you walk comfortably?  can not walk on his own    Patient Stated Goals  unable to state, his wife would like to see him walk again       Sci fit for 6 min for LE strength and ROM, some assistance with Lt arm, does not like to use Rt arm due to RTC injury  Sit to stands X 5 spread out during session with max A and max encouragment only stood for about 5 sec max today and continues to push back and state "I cant do this"  Attempted  standing frame X 3 but unable to achieve full standing with knee extension, would get him about half way up then he would cry out in knee pain and so he was lowered back down and instead max assisted with PT to stand.    PT Short Term Goals - 07/09/19 0816      PT SHORT TERM GOAL #1   Title  Pt will be I and compliant with initial HEP. (target for STG 6 weeks 10/9)      PT SHORT TERM GOAL #2   Title  Pt will be able to perform sit to stand with mod assist.    Baseline  total    Status  New        PT Long Term Goals - 07/09/19 0818      PT LONG TERM GOAL #1   Title  Pt will be I and compliant with final HEP. (target for all LTG 12 weeks 09/30/19)    Status  New      PT LONG TERM GOAL #2   Title  Pt will be able to perform sit to stand mod I.    Status  New      PT LONG TERM GOAL #3   Title  Pt will be able perform stand pivot transfers min A to decrease caregiver burden.    Status  New      PT LONG TERM GOAL #4   Title  Pt will be able to tolerate standing for 5 min with UE support.      PT LONG TERM GOAL #5   Title  Pt will improve LE strength to at least 4/5 MMT bilat    Baseline  2-3    Status  New            Plan - 07/26/19 2118    Clinical Impression Statement  He had poor cooperation today, and did not want to paticipate much with session. He normally lifts his legs and will lean forward some when asked but today did not want to do anything and kept saying "I cant do that, or my knee hurts". He became very agitated during session. Attempted standing frame 3X but he would refuse to pull up and cry out about knee pain so this was discontinued. He was at least able to perform sci fit for leg strength and ROM as well as participate in sit to stands with max A and max encouragement from PT. PT discussed with his wife that his prognosis will be limited unless he can motivate himself and paticipate more.    Personal Factors and Comorbidities  Age;Comorbidity 1;Comorbidity  2;Comorbidity 3+;Past/Current Experience;Fitness    Comorbidities  PMH: recent hospitilization 05/22/19 atrial fibrillation, dementia, essential hypertension, HLD, diastolic congestive heart failure, aortic valve regurgitation,  BPH, and recent small bowel obstruction with recent surgical intervention with lysis of adhesions    Examination-Activity Limitations  --   all ADL's   Examination-Participation Restrictions  --   All   Stability/Clinical Decision Making  Evolving/Moderate complexity    Rehab Potential  Fair    PT Frequency  Other (comment)   2-3   PT Duration  12 weeks    PT Treatment/Interventions  ADLs/Self Care Home Management;Aquatic Therapy;Cryotherapy;Moist Heat;DME Instruction;Gait training;Functional mobility training;Therapeutic activities;Therapeutic exercise;Balance training;Neuromuscular re-education;Manual techniques;Wheelchair mobility training;Patient/family education;Passive range of motion;Taping    PT Next Visit Plan  address postural strengthening, postural stretching, LE strengtheing, unsupported balance with emphasis on anterior weight shifting, slide board transfers and standing frame    PT Home Exercise Plan  LAQ, sitting marches, sitting hamstring curls, SLR    Consulted and Agree with Plan of Care  Patient;Family member/caregiver    Family Member Consulted  wife       Patient will benefit from skilled therapeutic intervention in order to improve the following deficits and impairments:  Decreased balance, Abnormal gait, Decreased endurance, Decreased range of motion, Decreased strength, Decreased mobility, Difficulty walking, Postural dysfunction, Pain  Visit Diagnosis: Muscle weakness (generalized)  Other abnormalities of gait and mobility  Chronic pain of left knee     Problem List Patient Active Problem List   Diagnosis Date Noted  . Bacteremia due to Enterococcus 05/24/2019  . Urinary tract infection 05/24/2019  . Chronic diastolic CHF  (congestive heart failure) ([facility]) 05/22/2019  . Gout 05/22/2019  . Sepsis ([facility] [place]) 05/22/2019  . Protein-calorie malnutrition, severe 05/18/2019  . AKI (acute kidney injury) ([facility]) 05/05/2019  . Transient hypotension 05/05/2019  . Sepsis, Gram negative ([facility]) 03/18/2019  . Hypoalbuminemia   . Leukocytosis   . Hyperglycemia   . Persistent atrial fibrillation with RVR   . Acute on chronic anemia   . Rupture of left patellar tendon 02/15/2019  . Postoperative pain   . Bipolar 1 disorder ([facility]) 02/14/2019  . Dementia without behavioral disturbance ([place]) 02/14/2019  . Patellar tendon rupture, left, initial encounter 02/10/2019  . SIRS (systemic inflammatory response syndrome) ([place]) 02/09/2019  . Osteomyelitis ([place]) 02/09/2019  . Acute blood loss anemia 02/09/2019  . Aortic valve regurgitation 10/16/2018  . SBO (small bowel obstruction) ([facility]) 07/08/2013  . Hyperlipidemia 12/20/2011  . HTN (hypertension) 06/04/2011  . Atrial fibrillation, chronic 03/01/2011  . Long term (current) use of anticoagulants 03/01/2011  . GERD 10/11/2009  . DIVERTICULOSIS OF COLON 10/11/2009    Silvestre Mesi 07/26/2019, 9:25 PM  [facility] [address] [place], [place], [address] Phone: [phone]   Fax:  [phone]  Name: Russell Dillon MRN: [id number] Date of Birth: 06-Oct-1946

## 2019-07-27 ENCOUNTER — Other Ambulatory Visit: Payer: Self-pay | Admitting: Psychiatry

## 2019-07-30 ENCOUNTER — Other Ambulatory Visit: Payer: Self-pay

## 2019-07-30 ENCOUNTER — Ambulatory Visit: Payer: Medicare Other | Admitting: Physical Therapy

## 2019-07-30 DIAGNOSIS — R2689 Other abnormalities of gait and mobility: Secondary | ICD-10-CM | POA: Diagnosis not present

## 2019-07-30 DIAGNOSIS — G8929 Other chronic pain: Secondary | ICD-10-CM | POA: Diagnosis not present

## 2019-07-30 DIAGNOSIS — M25562 Pain in left knee: Secondary | ICD-10-CM | POA: Diagnosis not present

## 2019-07-30 DIAGNOSIS — M6281 Muscle weakness (generalized): Secondary | ICD-10-CM | POA: Diagnosis not present

## 2019-08-01 NOTE — Therapy (Signed)
[facility] [address] [place], [place], [address] Phone: [phone]   Fax:  [phone]  Physical Therapy Treatment  Patient Details  Name: Russell Dillon MRN: [id number] Date of Birth: 08-18-1946 Referring Provider (PT): Marton Redwood, MD   Encounter Date: 07/30/2019  PT End of Session - 08/01/19 2031    Visit Number  6    Number of Visits  24    Date for PT Re-Evaluation  09/30/19    Authorization Type  MCR, AARP    PT Start Time  1533    PT Stop Time  1620    PT Time Calculation (min)  47 min    Equipment Utilized During Treatment  Other (comment)   standing frame, stedy lift assist, did not attempt gait belt today   Activity Tolerance  Patient limited by fatigue;Treatment limited secondary to agitation;Patient limited by pain   pt agitated when attempting the standing frame a second time, better when changed to another activity   Behavior During Therapy  Flat affect;Agitated;WFL for tasks assessed/performed   mostly cooperative today      Past Medical History:  Diagnosis Date  . Acute metabolic encephalopathy 06/05/2034  . Aortic valve regurgitation 10/16/2018   Echo 07/28/2017:  EF 60-65, moderate AI, aortic root and ascending aorta mildly dilated (40 mm), ascending aorta 43 mm, MAC, mild MR, mild LAE, moderate RV enlargement, trivial TR // Echo 12/19: EF 60-65, normal wall motion, mild AI, moderate BAE, ascending aorta 43 mm, aortic root 39 mm  . BPH (benign prostatic hyperplasia)   . BPH (benign prostatic hypertrophy)   . Chronic anticoagulation   . Chronic atrial fibrillation   . Diastolic dysfunction October 2010   Normal LV systolic function  . Hx SBO   . Hyperlipidemia   . Hypertension   . Patellar tendon rupture, left, initial encounter 02/10/2019  . Severe sepsis ([place]) 05/05/2019  . Small bowel obstruction [facility])     Past Surgical History:  Procedure Laterality Date  . APPENDECTOMY  2004   . APPLICATION OF WOUND VAC Left 02/11/2019   Procedure: Application Of Wound Vac;  Surgeon: Altamese Whitman, MD;  Location: [place];  Service: Orthopedics;  Laterality: Left;  . CARDIOVASCULAR STRESS TEST  09/30/2007   EF 67%  No ischemia.   Marland Kitchen CARDIOVERSION  05/01/2004  . KNEE ARTHROSCOPY Left 02/11/2019   Procedure: ARTHROSCOPY LEFT KNEE;  Surgeon: Altamese [place], MD;  Location: [place];  Service: Orthopedics;  Laterality: Left;  . LAPAROTOMY N/A 05/11/2019   Procedure: EXPLORATORY LAPAROTOMY;  Surgeon: Rolm Bookbinder, MD;  Location: [facility];  Service: General;  Laterality: N/A;  . LYSIS OF ADHESION N/A 05/11/2019   Procedure: LYSIS OF ADHESION;  Surgeon: Rolm Bookbinder, MD;  Location: [facility];  Service: General;  Laterality: N/A;  . PATELLAR TENDON REPAIR Left 02/11/2019   Procedure: LEFT PATELLA TENDON REPAIR;  Surgeon: Altamese Cecilia, MD;  Location: [place];  Service: Orthopedics;  Laterality: Left;  . SMALL INTESTINE SURGERY  2004  . US ECHOCARDIOGRAPHY  09/05/2009   ef 55-60%    There were no vitals filed for this visit.  Subjective Assessment - 08/01/19 2048    Subjective  Pt present with spouse. "I'm here i guess" when asked how he was. Spouse reports they are doing the ex's at homes most days, some days he does not coorperate with them. No falls. When asked if his leg hurts pt stated "did I say it did". Will monitor with  session.    Pertinent History  PMH: recent hospitilization 05/22/19 atrial fibrillation, dementia, essential hypertension, HLD, diastolic congestive heart failure, aortic valve regurgitation, BPH, and recent small bowel obstruction with recent surgical intervention with lysis of adhesions    Limitations  Walking;Standing    How long can you stand comfortably?  can not stand on his own    How long can you walk comfortably?  can not walk on his own    Patient Stated Goals  unable to state, his wife would like to see him walk again    Currently in Pain?  No/denies   pt denies  until pressure applied to LE at knee area.   Pain Score  0-No pain          [facility] Adult PT Treatment/Exercise - 08/01/19 2051      Transfers   Transfers  Sit to Stand;Stand to Sit;Lateral/Scoot Transfers    Sit to Stand  3: Mod assist;2: Max assist;With upper extremity assist;From bed    Sit to Stand Details  Tactile cues for weight shifting;Tactile cues for sequencing;Tactile cues for initiation;Manual facilitation for weight shifting;Verbal cues for safe use of DME/AE;Verbal cues for sequencing;Verbal cues for technique;Visual cues for safe use of DME/AE    Stand to Sit  3: Mod assist;2: Max assist;With upper extremity assist;To bed    Stand to Sit Details (indicate cue type and reason)  Tactile cues for initiation;Tactile cues for sequencing;Tactile cues for weight shifting;Tactile cues for posture;Visual cues for safe use of DME/AE;Visual cues/gestures for precautions/safety;Verbal cues for technique;Verbal cues for precautions/safety;Manual facilitation for weight shifting;Manual facilitation for placement    Lateral/Scoot Transfers  4: Min assist;4: Min guard    Lateral/Scoot Transfer Details (indicate cue type and reason)  once person assist with increased time needed. total assist to place slide board with pt assisting with weight shifting. assist needed to initiatate movement from W/C, once started adn on board pt needed cues for hand placement for scooting across board with min assist. does better to allow him to try 1st/initiate movement then offer to assist. same with going back from mat table to W/C (level surfaces) with cues on weight shifting, hand placement and increased time to allow for increased pt performance.     Comments  used black standing frame with standing achieved x1 for up to 2 minutes with cues for upright posture. pt reporting increased left knee pain due to pressure from knee block padding. attempted to adjust it with no change report and unable to stand again with  this lift due to pain/pt becoming agitated. Changed plans to use of white Stedy lift assist where pt needs to assist with standing. attempted x2 with 1 person assist. 1st time pt was pushing back making standing unatainable due to posterior push. Able to cue pt to reach and hold further pole and pull up with max assist provided, still unable to achieve standing. Obtained 2cd person assist, with time got pt back in this position and with 2 person max assist cleared mat table, however unable to achieve standing due to fatigue, no pain reported. Unable to raise mat at this time due to power outage, this may make standing possible next session.       Therapeutic Activites    Therapeutic Activities  Other Therapeutic Activities    Other Therapeutic Activities  in standing frame: worked on posture with emphasis on cervical extension with cue that spouse wanted his picture so he should "look at her"; in sitting  at edge of mat between standing/standing attmepts- worked on unsupported sitting with no back support with cues on posture/cervical extension. pt also performed lateral weight shifting for pad and slide board positioning under him with min guard to min assist with cues on technique. otherwise supervision at edge of mat.       Knee/Hip Exercises: Aerobic   Other Aerobic  Scifit level 1.5 x5 minutes with goal to keep moving using bil LE's/left UE. pt able to maintain a steady pace with minimal cues. performed from wheelchiar with incline blocks behind wheels to prevent W/C from moving.                 PT Short Term Goals - 07/09/19 0816      PT SHORT TERM GOAL #1   Title  Pt will be I and compliant with initial HEP. (target for STG 6 weeks 10/9)      PT SHORT TERM GOAL #2   Title  Pt will be able to perform sit to stand with mod assist.    Baseline  total    Status  New        PT Long Term Goals - 07/09/19 0818      PT LONG TERM GOAL #1   Title  Pt will be I and compliant with final  HEP. (target for all LTG 12 weeks 09/30/19)    Status  New      PT LONG TERM GOAL #2   Title  Pt will be able to perform sit to stand mod I.    Status  New      PT LONG TERM GOAL #3   Title  Pt will be able perform stand pivot transfers min A to decrease caregiver burden.    Status  New      PT LONG TERM GOAL #4   Title  Pt will be able to tolerate standing for 5 min with UE support.      PT LONG TERM GOAL #5   Title  Pt will improve LE strength to at least 4/5 MMT bilat    Baseline  2-3    Status  New            Plan - 08/01/19 2032    Clinical Impression Statement  Today's skilles session continued to focus on strengthening, transfer training with slide board and standing tolerance with lift equipment. Able to achieve standing with standing frame once with pt reporting increased pain at left LE due to contact of shin pad on knee. Attempted to adjust the pad with it being as low as it goes. Switched to the steady lift assist which sits lower on the LE. Attempted to stand with one person assit with no clearing of mat table. With 2 person assist cleared mat table, however not able to achieve full standing. Unable to raise high low table due to power outage. Will plan to try stedy lift assist again from elevated mat table next session .Pt with increased cooperation today. Does well whel allowed to self initate movement, then asking if therapist can assist. Needs increased time for processing with max encouragement. Does not appear to recall previous session, therefore acted as if this was our first time working together and pt was overall responsive and coorperative.    Personal Factors and Comorbidities  Age;Comorbidity 1;Comorbidity 2;Comorbidity 3+;Past/Current Experience;Fitness    Comorbidities  PMH: recent hospitilization 05/22/19 atrial fibrillation, dementia, essential hypertension, HLD, diastolic congestive heart failure, aortic valve regurgitation, BPH,  and recent small bowel  obstruction with recent surgical intervention with lysis of adhesions    Examination-Activity Limitations  --   all ADL's   Examination-Participation Restrictions  --   All   Stability/Clinical Decision Making  Evolving/Moderate complexity    Rehab Potential  Fair    PT Frequency  Other (comment)   2-3   PT Duration  12 weeks    PT Treatment/Interventions  ADLs/Self Care Home Management;Aquatic Therapy;Cryotherapy;Moist Heat;DME Instruction;Gait training;Functional mobility training;Therapeutic activities;Therapeutic exercise;Balance training;Neuromuscular re-education;Manual techniques;Wheelchair mobility training;Patient/family education;Passive range of motion;Taping    PT Next Visit Plan  scift from wheelchair to warm up; slide board transfer to/from high/low table, from high low table try stedy lift assist with mat table elevated with 2 person assist. allow time for pt to self initaiate once cued on step by step what is expected of him. continue to work on unsupported sitting posture and balance as well.    PT Home Exercise Plan  LAQ, sitting marches, sitting hamstring curls, SLR    Consulted and Agree with Plan of Care  Patient;Family member/caregiver    Family Member Consulted  wife       Patient will benefit from skilled therapeutic intervention in order to improve the following deficits and impairments:  Decreased balance, Abnormal gait, Decreased endurance, Decreased range of motion, Decreased strength, Decreased mobility, Difficulty walking, Postural dysfunction, Pain  Visit Diagnosis: Muscle weakness (generalized)  Other abnormalities of gait and mobility  Chronic pain of left knee     Problem List Patient Active Problem List   Diagnosis Date Noted  . Bacteremia due to Enterococcus 05/24/2019  . Urinary tract infection 05/24/2019  . Chronic diastolic CHF (congestive heart failure) ([place]) 05/22/2019  . Gout 05/22/2019  . Sepsis ([place]) 05/22/2019  . Protein-calorie  malnutrition, severe 05/18/2019  . AKI (acute kidney injury) ([place]) 05/05/2019  . Transient hypotension 05/05/2019  . Sepsis, Gram negative ([place]) 03/18/2019  . Hypoalbuminemia   . Leukocytosis   . Hyperglycemia   . Persistent atrial fibrillation with RVR   . Acute on chronic anemia   . Rupture of left patellar tendon 02/15/2019  . Postoperative pain   . Bipolar 1 disorder ([place]) 02/14/2019  . Dementia without behavioral disturbance ([place]) 02/14/2019  . Patellar tendon rupture, left, initial encounter 02/10/2019  . SIRS (systemic inflammatory response syndrome) ([place]) 02/09/2019  . Osteomyelitis ([place]) 02/09/2019  . Acute blood loss anemia 02/09/2019  . Aortic valve regurgitation 10/16/2018  . SBO (small bowel obstruction) ([place]) 07/08/2013  . Hyperlipidemia 12/20/2011  . HTN (hypertension) 06/04/2011  . Atrial fibrillation, chronic 03/01/2011  . Long term (current) use of anticoagulants 03/01/2011  . GERD 10/11/2009  . DIVERTICULOSIS OF COLON 10/11/2009    Willow Ora, PTA, [facility] [address] [place], [place] [address] [phone] 08/01/19, 9:05 PM   Name: Russell Dillon MRN: [id number] Date of Birth: 1946-02-11

## 2019-08-02 ENCOUNTER — Ambulatory Visit: Payer: Medicare Other | Admitting: Physical Therapy

## 2019-08-02 ENCOUNTER — Other Ambulatory Visit: Payer: Self-pay

## 2019-08-02 DIAGNOSIS — M6281 Muscle weakness (generalized): Secondary | ICD-10-CM

## 2019-08-02 DIAGNOSIS — R2689 Other abnormalities of gait and mobility: Secondary | ICD-10-CM | POA: Diagnosis not present

## 2019-08-02 DIAGNOSIS — G8929 Other chronic pain: Secondary | ICD-10-CM | POA: Diagnosis not present

## 2019-08-02 DIAGNOSIS — M25562 Pain in left knee: Secondary | ICD-10-CM | POA: Diagnosis not present

## 2019-08-02 NOTE — Therapy (Signed)
[facility] [address] [place], [place], [address] Phone: [phone]   Fax:  [phone]  Physical Therapy Treatment  Patient Details  Name: Russell Dillon MRN: [id number] Date of Birth: 14-May-1946 Referring Provider (PT): Marton Redwood, MD   Encounter Date: 08/02/2019  PT End of Session - 08/02/19 1813    Visit Number  7    Number of Visits  24    Date for PT Re-Evaluation  09/30/19    Authorization Type  MCR, AARP    PT Start Time  2637    PT Stop Time  1620    PT Time Calculation (min)  45 min    Equipment Utilized During Treatment  Other (comment)   standing frame, stedy lift assist, did not attempt gait belt today   Activity Tolerance  Patient limited by fatigue;Treatment limited secondary to agitation;Patient limited by pain   pt agitated when attempting the standing frame a second time, better when changed to another activity   Behavior During Therapy  Flat affect;Agitated;WFL for tasks assessed/performed   mostly cooperative today      Past Medical History:  Diagnosis Date  . Acute metabolic encephalopathy 8/58/8502  . Aortic valve regurgitation 10/16/2018   Echo 07/28/2017:  EF 60-65, moderate AI, aortic root and ascending aorta mildly dilated (40 mm), ascending aorta 43 mm, MAC, mild MR, mild LAE, moderate RV enlargement, trivial TR // Echo 12/19: EF 60-65, normal wall motion, mild AI, moderate BAE, ascending aorta 43 mm, aortic root 39 mm  . BPH (benign prostatic hyperplasia)   . BPH (benign prostatic hypertrophy)   . Chronic anticoagulation   . Chronic atrial fibrillation   . Diastolic dysfunction October 2010   Normal LV systolic function  . Hx SBO   . Hyperlipidemia   . Hypertension   . Patellar tendon rupture, left, initial encounter 02/10/2019  . Severe sepsis ([place]) 05/05/2019  . Small bowel obstruction [facility])     Past Surgical History:  Procedure Laterality Date  . APPENDECTOMY  2004   . APPLICATION OF WOUND VAC Left 02/11/2019   Procedure: Application Of Wound Vac;  Surgeon: Altamese [place], MD;  Location: [place] [facility];  Service: Orthopedics;  Laterality: Left;  . CARDIOVASCULAR STRESS TEST  09/30/2007   EF 67%  No ischemia.   Marland Kitchen CARDIOVERSION  05/01/2004  . KNEE ARTHROSCOPY Left 02/11/2019   Procedure: ARTHROSCOPY LEFT KNEE;  Surgeon: Altamese [place], MD;  Location: [facility];  Service: Orthopedics;  Laterality: Left;  . LAPAROTOMY N/A 05/11/2019   Procedure: EXPLORATORY LAPAROTOMY;  Surgeon: Rolm Bookbinder, MD;  Location: [facility];  Service: General;  Laterality: N/A;  . LYSIS OF ADHESION N/A 05/11/2019   Procedure: LYSIS OF ADHESION;  Surgeon: Rolm Bookbinder, MD;  Location: [place];  Service: General;  Laterality: N/A;  . PATELLAR TENDON REPAIR Left 02/11/2019   Procedure: LEFT PATELLA TENDON REPAIR;  Surgeon: Altamese [place], MD;  Location: [place];  Service: Orthopedics;  Laterality: Left;  . SMALL INTESTINE SURGERY  2004  . US ECHOCARDIOGRAPHY  09/05/2009   ef 55-60%    There were no vitals filed for this visit.  Subjective Assessment - 08/02/19 1810    Subjective  He relays his Lt hand is bothering him a lot, wife says it is because of gout, he then relays "how long do I have to be here today, I want to go home when I can". His wife relays continued sensitivity in her Lt knee    Pertinent  History  PMH: recent hospitilization 05/22/19 atrial fibrillation, dementia, essential hypertension, HLD, diastolic congestive heart failure, aortic valve regurgitation, BPH, and recent small bowel obstruction with recent surgical intervention with lysis of adhesions    Limitations  Walking;Standing    How long can you stand comfortably?  can not stand on his own    How long can you walk comfortably?  can not walk on his own    Patient Stated Goals  unable to state, his wife would like to see him walk again       Interventions:  sci fit X 7 min with max encouragement to keep going Hot/cold  contrast for knee sensitivity (7 min total switching every minute, unbilled time) Chair<> mat table sliding board transfers with max A +2 Sit to stand into steady from raised ht of low mat table max A+2 then had him sit in steady for about 5 min while performed 3 more sit to stands inside steady but still unable to achieve full upright position.     PT Education - 08/02/19 1812    Education Details  hot/cold contrast for Lt knee hypersensitivity    Person(s) Educated  Patient;Spouse    Methods  Explanation;Demonstration    Comprehension  Verbalized understanding       PT Short Term Goals - 08/02/19 1817      PT SHORT TERM GOAL #1   Title  Pt will be I and compliant with initial HEP. (target for STG 6 weeks 10/9)    Status  On-going      PT SHORT TERM GOAL #2   Title  Pt will be able to perform sit to stand with mod assist.    Baseline  total    Status  On-going        PT Long Term Goals - 07/09/19 0818      PT LONG TERM GOAL #1   Title  Pt will be I and compliant with final HEP. (target for all LTG 12 weeks 09/30/19)    Status  New      PT LONG TERM GOAL #2   Title  Pt will be able to perform sit to stand mod I.    Status  New      PT LONG TERM GOAL #3   Title  Pt will be able perform stand pivot transfers min A to decrease caregiver burden.    Status  New      PT LONG TERM GOAL #4   Title  Pt will be able to tolerate standing for 5 min with UE support.      PT LONG TERM GOAL #5   Title  Pt will improve LE strength to at least 4/5 MMT bilat    Baseline  2-3    Status  New            Plan - 08/02/19 1813    Clinical Impression Statement  Continues to be limited by agitation and fear of standing/falling however was able to progress today to standing upright enough to place the seat pads down in the steady while performing partial sit to stands  X3. He continues to need max encouragement for participation but does help at times. He is still mostly max assistance  +2 for chair to bed transfers. He was trialed with hot/cold contrast to his left knee due to knee hypersensitivity and his wife was encouraged to try this at home.    Personal Factors and Comorbidities  Age;Comorbidity 1;Comorbidity 2;Comorbidity  3+;Past/Current Experience;Fitness    Comorbidities  PMH: recent hospitilization 05/22/19 atrial fibrillation, dementia, essential hypertension, HLD, diastolic congestive heart failure, aortic valve regurgitation, BPH, and recent small bowel obstruction with recent surgical intervention with lysis of adhesions    Examination-Activity Limitations  --   all ADL's   Examination-Participation Restrictions  --   All   Stability/Clinical Decision Making  Evolving/Moderate complexity    Rehab Potential  Fair    PT Frequency  Other (comment)   2-3   PT Duration  12 weeks    PT Treatment/Interventions  ADLs/Self Care Home Management;Aquatic Therapy;Cryotherapy;Moist Heat;DME Instruction;Gait training;Functional mobility training;Therapeutic activities;Therapeutic exercise;Balance training;Neuromuscular re-education;Manual techniques;Wheelchair mobility training;Patient/family education;Passive range of motion;Taping    PT Next Visit Plan  scift from wheelchair to warm up; slide board transfer to/from high/low table, from high low table try stedy lift assist with mat table elevated with 2 person assist. allow time for pt to self initaiate once cued on step by step what is expected of him. continue to work on unsupported sitting posture and balance as well.    PT Home Exercise Plan  LAQ, sitting marches, sitting hamstring curls, SLR    Consulted and Agree with Plan of Care  Patient;Family member/caregiver    Family Member Consulted  wife       Patient will benefit from skilled therapeutic intervention in order to improve the following deficits and impairments:  Decreased balance, Abnormal gait, Decreased endurance, Decreased range of motion, Decreased strength,  Decreased mobility, Difficulty walking, Postural dysfunction, Pain  Visit Diagnosis: Muscle weakness (generalized)  Other abnormalities of gait and mobility  Chronic pain of left knee     Problem List Patient Active Problem List   Diagnosis Date Noted  . Bacteremia due to Enterococcus 05/24/2019  . Urinary tract infection 05/24/2019  . Chronic diastolic CHF (congestive heart failure) ([place]) 05/22/2019  . Gout 05/22/2019  . Sepsis ([place]) 05/22/2019  . Protein-calorie malnutrition, severe 05/18/2019  . AKI (acute kidney injury) ([place]) 05/05/2019  . Transient hypotension 05/05/2019  . Sepsis, Gram negative ([place]) 03/18/2019  . Hypoalbuminemia   . Leukocytosis   . Hyperglycemia   . Persistent atrial fibrillation with RVR   . Acute on chronic anemia   . Rupture of left patellar tendon 02/15/2019  . Postoperative pain   . Bipolar 1 disorder ([place]) 02/14/2019  . Dementia without behavioral disturbance ([place]) 02/14/2019  . Patellar tendon rupture, left, initial encounter 02/10/2019  . SIRS (systemic inflammatory response syndrome) ([place]) 02/09/2019  . Osteomyelitis ([place]) 02/09/2019  . Acute blood loss anemia 02/09/2019  . Aortic valve regurgitation 10/16/2018  . SBO (small bowel obstruction) ([place]) 07/08/2013  . Hyperlipidemia 12/20/2011  . HTN (hypertension) 06/04/2011  . Atrial fibrillation, chronic 03/01/2011  . Long term (current) use of anticoagulants 03/01/2011  . GERD 10/11/2009  . DIVERTICULOSIS OF COLON 10/11/2009    Silvestre Mesi 08/02/2019, 6:18 PM  [facility] [address] [place], [place], [address] Phone: [phone]   Fax:  [phone]  Name: Russell Dillon MRN: [id number] Date of Birth: 1946-05-08

## 2019-08-04 ENCOUNTER — Ambulatory Visit: Payer: Medicare Other | Admitting: Physical Therapy

## 2019-08-04 ENCOUNTER — Telehealth: Payer: Self-pay | Admitting: Licensed Clinical Social Worker

## 2019-08-04 ENCOUNTER — Other Ambulatory Visit: Payer: Self-pay

## 2019-08-04 DIAGNOSIS — M6281 Muscle weakness (generalized): Secondary | ICD-10-CM

## 2019-08-04 DIAGNOSIS — G8929 Other chronic pain: Secondary | ICD-10-CM | POA: Diagnosis not present

## 2019-08-04 DIAGNOSIS — R2689 Other abnormalities of gait and mobility: Secondary | ICD-10-CM

## 2019-08-04 DIAGNOSIS — M25562 Pain in left knee: Secondary | ICD-10-CM | POA: Diagnosis not present

## 2019-08-04 NOTE — Telephone Encounter (Signed)
Palliative Care SW left a vm with patient's wife, [name], to schedule a visit.

## 2019-08-05 NOTE — Therapy (Signed)
[facility] [address] [place], [place], [address] Phone: [phone]   Fax:  [phone]  Physical Therapy Treatment  Patient Details  Name: Russell Dillon MRN: [id number] Date of Birth: 25-Oct-1946 Referring Provider (PT): Marton Redwood, MD   Encounter Date: 08/04/2019  PT End of Session - 08/05/19 0631    Visit Number  8    Number of Visits  24    Date for PT Re-Evaluation  09/30/19    Authorization Type  MCR, AARP    PT Start Time  7416    PT Stop Time  1610    PT Time Calculation (min)  40 min    Equipment Utilized During Treatment  Other (comment)   standing frame, stedy lift assist, did not attempt gait belt today   Activity Tolerance  Patient limited by fatigue;Treatment limited secondary to agitation;Patient limited by pain   pt agitated when attempting the standing frame a second time, better when changed to another activity   Behavior During Therapy  Flat affect;Agitated;WFL for tasks assessed/performed   mostly cooperative today      Past Medical History:  Diagnosis Date  . Acute metabolic encephalopathy 3/84/5364  . Aortic valve regurgitation 10/16/2018   Echo 07/28/2017:  EF 60-65, moderate AI, aortic root and ascending aorta mildly dilated (40 mm), ascending aorta 43 mm, MAC, mild MR, mild LAE, moderate RV enlargement, trivial TR // Echo 12/19: EF 60-65, normal wall motion, mild AI, moderate BAE, ascending aorta 43 mm, aortic root 39 mm  . BPH (benign prostatic hyperplasia)   . BPH (benign prostatic hypertrophy)   . Chronic anticoagulation   . Chronic atrial fibrillation   . Diastolic dysfunction October 2010   Normal LV systolic function  . Hx SBO   . Hyperlipidemia   . Hypertension   . Patellar tendon rupture, left, initial encounter 02/10/2019  . Severe sepsis ([place]) 05/05/2019  . Small bowel obstruction [facility])     Past Surgical History:  Procedure Laterality Date  . APPENDECTOMY  2004   . APPLICATION OF WOUND VAC Left 02/11/2019   Procedure: Application Of Wound Vac;  Surgeon: Altamese Tavistock, MD;  Location: [facility];  Service: Orthopedics;  Laterality: Left;  . CARDIOVASCULAR STRESS TEST  09/30/2007   EF 67%  No ischemia.   Marland Kitchen CARDIOVERSION  05/01/2004  . KNEE ARTHROSCOPY Left 02/11/2019   Procedure: ARTHROSCOPY LEFT KNEE;  Surgeon: Altamese Osmond, MD;  Location: [facility];  Service: Orthopedics;  Laterality: Left;  . LAPAROTOMY N/A 05/11/2019   Procedure: EXPLORATORY LAPAROTOMY;  Surgeon: Rolm Bookbinder, MD;  Location: [facility];  Service: General;  Laterality: N/A;  . LYSIS OF ADHESION N/A 05/11/2019   Procedure: LYSIS OF ADHESION;  Surgeon: Rolm Bookbinder, MD;  Location: [facility];  Service: General;  Laterality: N/A;  . PATELLAR TENDON REPAIR Left 02/11/2019   Procedure: LEFT PATELLA TENDON REPAIR;  Surgeon: Altamese Cohoe, MD;  Location: [facility];  Service: Orthopedics;  Laterality: Left;  . SMALL INTESTINE SURGERY  2004  . US ECHOCARDIOGRAPHY  09/05/2009   ef 55-60%    There were no vitals filed for this visit.  Subjective Assessment - 08/05/19 0630    Subjective  Agrees to PT if he can leave soon ,denies pain at the moment    Pertinent History  PMH: recent hospitilization 05/22/19 atrial fibrillation, dementia, essential hypertension, HLD, diastolic congestive heart failure, aortic valve regurgitation, BPH, and recent small bowel obstruction with recent surgical intervention with lysis of adhesions  Limitations  Walking;Standing    How long can you stand comfortably?  can not stand on his own    How long can you walk comfortably?  can not walk on his own    Patient Stated Goals  unable to state, his wife would like to see him walk again      Treatment:   sci fit 9 min without needing rest break and without needing encouragement to keep going  chair to table tfers with sliding board, attempted one each direction with education for technique but unmotivated with this and was  max A +2. However when prompted to transfer back from bed to chair he was very motivated and able to perform mod I with close supervision showing huge improvement.   sitting balance overall supervision and able to perform reaching to targets while maintaining balance  sit to stand X 3 while holding at the top as long as possible (about 30 seconds) requiring 2 person max assist and he is still not able to achieve upright posture or full knee extension with this.    PT Education - 08/05/19 0631    Education Details  chair to bed transfer techniques    Person(s) Educated  Patient;Spouse    Methods  Explanation;Demonstration;Verbal cues    Comprehension  Need further instruction       PT Short Term Goals - 08/02/19 1817      PT SHORT TERM GOAL #1   Title  Pt will be I and compliant with initial HEP. (target for STG 6 weeks 10/9)    Status  On-going      PT SHORT TERM GOAL #2   Title  Pt will be able to perform sit to stand with mod assist.    Baseline  total    Status  On-going        PT Long Term Goals - 07/09/19 0818      PT LONG TERM GOAL #1   Title  Pt will be I and compliant with final HEP. (target for all LTG 12 weeks 09/30/19)    Status  New      PT LONG TERM GOAL #2   Title  Pt will be able to perform sit to stand mod I.    Status  New      PT LONG TERM GOAL #3   Title  Pt will be able perform stand pivot transfers min A to decrease caregiver burden.    Status  New      PT LONG TERM GOAL #4   Title  Pt will be able to tolerate standing for 5 min with UE support.      PT LONG TERM GOAL #5   Title  Pt will improve LE strength to at least 4/5 MMT bilat    Baseline  2-3    Status  New            Plan - 08/05/19 1829    Clinical Impression Statement  Per his wife he has been able to tolerate well the hot/cold contrast for his knee sensitivity. Overall the best session I have had with him in terms of engagement and assiting during session. He lifted his legs  on his own for leg rests and for the sci fit machine, he performed sci fit longer without cuing to keep going, and he was able to perform bed to char transfer basically mod I as he is motivated to return to wheelchair. When going wheelchair to bed he  was more max A +2 despite encouragement and demonstration for slideboard technique. He was able to perform sit to stand X 3 today but still unable to fully achieve erect posture. PT will continue to progress as tolerated.    Personal Factors and Comorbidities  Age;Comorbidity 1;Comorbidity 2;Comorbidity 3+;Past/Current Experience;Fitness    Comorbidities  PMH: recent hospitilization 05/22/19 atrial fibrillation, dementia, essential hypertension, HLD, diastolic congestive heart failure, aortic valve regurgitation, BPH, and recent small bowel obstruction with recent surgical intervention with lysis of adhesions    Examination-Activity Limitations  --   all ADL's   Examination-Participation Restrictions  --   All   Stability/Clinical Decision Making  Evolving/Moderate complexity    Rehab Potential  Fair    PT Frequency  Other (comment)   2-3   PT Duration  12 weeks    PT Treatment/Interventions  ADLs/Self Care Home Management;Aquatic Therapy;Cryotherapy;Moist Heat;DME Instruction;Gait training;Functional mobility training;Therapeutic activities;Therapeutic exercise;Balance training;Neuromuscular re-education;Manual techniques;Wheelchair mobility training;Patient/family education;Passive range of motion;Taping    PT Next Visit Plan  scift from wheelchair to warm up; slide board transfer to/from high/low table, from high low table try stedy lift assist with mat table elevated with 2 person assist. allow time for pt to self initaiate once cued on step by step what is expected of him. continue to work on unsupported sitting posture and balance as well.    PT Home Exercise Plan  LAQ, sitting marches, sitting hamstring curls, SLR    Consulted and Agree with Plan of  Care  Patient;Family member/caregiver    Family Member Consulted  wife       Patient will benefit from skilled therapeutic intervention in order to improve the following deficits and impairments:  Decreased balance, Abnormal gait, Decreased endurance, Decreased range of motion, Decreased strength, Decreased mobility, Difficulty walking, Postural dysfunction, Pain  Visit Diagnosis: Muscle weakness (generalized)  Other abnormalities of gait and mobility  Chronic pain of left knee     Problem List Patient Active Problem List   Diagnosis Date Noted  . Bacteremia due to Enterococcus 05/24/2019  . Urinary tract infection 05/24/2019  . Chronic diastolic CHF (congestive heart failure) ([place]) 05/22/2019  . Gout 05/22/2019  . Sepsis ([place]) 05/22/2019  . Protein-calorie malnutrition, severe 05/18/2019  . AKI (acute kidney injury) ([place]) 05/05/2019  . Transient hypotension 05/05/2019  . Sepsis, Gram negative ([place]) 03/18/2019  . Hypoalbuminemia   . Leukocytosis   . Hyperglycemia   . Persistent atrial fibrillation with RVR   . Acute on chronic anemia   . Rupture of left patellar tendon 02/15/2019  . Postoperative pain   . Bipolar 1 disorder ([place]) 02/14/2019  . Dementia without behavioral disturbance ([place]) 02/14/2019  . Patellar tendon rupture, left, initial encounter 02/10/2019  . SIRS (systemic inflammatory response syndrome) ([place]) 02/09/2019  . Osteomyelitis ([place]) 02/09/2019  . Acute blood loss anemia 02/09/2019  . Aortic valve regurgitation 10/16/2018  . SBO (small bowel obstruction) ([place]) 07/08/2013  . Hyperlipidemia 12/20/2011  . HTN (hypertension) 06/04/2011  . Atrial fibrillation, chronic 03/01/2011  . Long term (current) use of anticoagulants 03/01/2011  . GERD 10/11/2009  . DIVERTICULOSIS OF COLON 10/11/2009    Debbe Odea, PT,DPT 08/05/2019, 6:37 AM  [facility] [address] [place], [place],  [address] Phone: [phone]   Fax:  [phone]  Name: Russell Dillon MRN: [id number] Date of Birth: 28-Jun-1946

## 2019-08-09 ENCOUNTER — Other Ambulatory Visit: Payer: Self-pay

## 2019-08-09 ENCOUNTER — Ambulatory Visit: Payer: Medicare Other | Admitting: Physical Therapy

## 2019-08-09 DIAGNOSIS — N390 Urinary tract infection, site not specified: Secondary | ICD-10-CM | POA: Diagnosis not present

## 2019-08-09 DIAGNOSIS — G8929 Other chronic pain: Secondary | ICD-10-CM | POA: Diagnosis not present

## 2019-08-09 DIAGNOSIS — M6281 Muscle weakness (generalized): Secondary | ICD-10-CM | POA: Diagnosis not present

## 2019-08-09 DIAGNOSIS — M25562 Pain in left knee: Secondary | ICD-10-CM

## 2019-08-09 DIAGNOSIS — R2689 Other abnormalities of gait and mobility: Secondary | ICD-10-CM

## 2019-08-09 NOTE — Therapy (Signed)
[facility] [address] [place], [place], [address] Phone: [phone]   Fax:  [phone]  Physical Therapy Treatment  Patient Details  Name: Russell Dillon MRN: [id number] Date of Birth: [birth year] Referring Provider (PT): Marton Redwood, MD   Encounter Date: 08/09/2019  PT End of Session - 08/09/19 1524    Visit Number  9    Number of Visits  24    Date for PT Re-Evaluation  09/30/19    Authorization Type  MCR, AARP    PT Start Time  9179    PT Stop Time  1505    PT Time Calculation (min)  49 min    Equipment Utilized During Treatment  Other (comment)   standing frame, stedy lift assist, did not attempt gait belt today   Activity Tolerance  Patient limited by fatigue;Treatment limited secondary to agitation;Patient limited by pain   pt agitated when attempting the standing frame a second time, better when changed to another activity   Behavior During Therapy  Flat affect;Agitated;WFL for tasks assessed/performed   mostly cooperative today      Past Medical History:  Diagnosis Date  . Acute metabolic encephalopathy 6/97/9480  . Aortic valve regurgitation 10/16/2018   Echo 07/28/2017:  EF 60-65, moderate AI, aortic root and ascending aorta mildly dilated (40 mm), ascending aorta 43 mm, MAC, mild MR, mild LAE, moderate RV enlargement, trivial TR // Echo 12/19: EF 60-65, normal wall motion, mild AI, moderate BAE, ascending aorta 43 mm, aortic root 39 mm  . BPH (benign prostatic hyperplasia)   . BPH (benign prostatic hypertrophy)   . Chronic anticoagulation   . Chronic atrial fibrillation   . Diastolic dysfunction October 2010   Normal LV systolic function  . Hx SBO   . Hyperlipidemia   . Hypertension   . Patellar tendon rupture, left, initial encounter 02/10/2019  . Severe sepsis ([place]) 05/05/2019  . Small bowel obstruction [facility])     Past Surgical History:  Procedure Laterality Date  . APPENDECTOMY  2004   . APPLICATION OF WOUND VAC Left 02/11/2019   Procedure: Application Of Wound Vac;  Surgeon: Altamese Redbird Smith, MD;  Location: [facility];  Service: Orthopedics;  Laterality: Left;  . CARDIOVASCULAR STRESS TEST  09/30/2007   EF 67%  No ischemia.   Marland Kitchen CARDIOVERSION  05/01/2004  . KNEE ARTHROSCOPY Left 02/11/2019   Procedure: ARTHROSCOPY LEFT KNEE;  Surgeon: Altamese Richland, MD;  Location: [facility];  Service: Orthopedics;  Laterality: Left;  . LAPAROTOMY N/A 05/11/2019   Procedure: EXPLORATORY LAPAROTOMY;  Surgeon: Rolm Bookbinder, MD;  Location: [facility];  Service: General;  Laterality: N/A;  . LYSIS OF ADHESION N/A 05/11/2019   Procedure: LYSIS OF ADHESION;  Surgeon: Rolm Bookbinder, MD;  Location: [facility];  Service: General;  Laterality: N/A;  . PATELLAR TENDON REPAIR Left 02/11/2019   Procedure: LEFT PATELLA TENDON REPAIR;  Surgeon: Altamese [place], MD;  Location: [facility];  Service: Orthopedics;  Laterality: Left;  . SMALL INTESTINE SURGERY  2004  . US ECHOCARDIOGRAPHY  09/05/2009   ef 55-60%    There were no vitals filed for this visit.  Subjective Assessment - 08/09/19 1522    Subjective  His Lt hand is feeling better now, he is taking a new medicine for gout flare up.    Pertinent History  PMH: recent hospitilization 05/22/19 atrial fibrillation, dementia, essential hypertension, HLD, diastolic congestive heart failure, aortic valve regurgitation, BPH, and recent small bowel obstruction with recent surgical intervention  with lysis of adhesions    Limitations  Walking;Standing    How long can you stand comfortably?  can not stand on his own    How long can you walk comfortably?  can not walk on his own    Patient Stated Goals  unable to state, his wife would like to see him walk again       Treatment/Interventions  Sci fit 9 min UE and LE for endurance and ROM  Self wheelchair propulsion with manual facilitation of hands on rails and visual and verbal cues but then able to perform with just min A for  turning 25 ft X 3  Wheelchair to mat table transfer with slideboard mod A +2 then at end performed mat to wheelchair seated scoot transfer supervision  Sit to stand X 4 total A +2, (2 times with steady (performed additional 2 partial sit to stands while in steady and stayed in steady about 5 min, then 2 times with no equipment and PT assist)  Seated sitting balance reaching for targets, seated scap retraction and extension with red X 15 ea  Seated LAQ and marches with 2 lbs X 20 ea, seated ankle DF X 15 each side (does not lift his Lt side as well)       PT Short Term Goals - 08/02/19 1817      PT SHORT TERM GOAL #1   Title  Pt will be I and compliant with initial HEP. (target for STG 6 weeks 10/9)    Status  On-going      PT SHORT TERM GOAL #2   Title  Pt will be able to perform sit to stand with mod assist.    Baseline  total    Status  On-going        PT Long Term Goals - 07/09/19 0818      PT LONG TERM GOAL #1   Title  Pt will be I and compliant with final HEP. (target for all LTG 12 weeks 09/30/19)    Status  New      PT LONG TERM GOAL #2   Title  Pt will be able to perform sit to stand mod I.    Status  New      PT LONG TERM GOAL #3   Title  Pt will be able perform stand pivot transfers min A to decrease caregiver burden.    Status  New      PT LONG TERM GOAL #4   Title  Pt will be able to tolerate standing for 5 min with UE support.      PT LONG TERM GOAL #5   Title  Pt will improve LE strength to at least 4/5 MMT bilat    Baseline  2-3    Status  New            Plan - 08/09/19 1524    Clinical Impression Statement  continued to work on gaining more independence with chair to bed transfers and added wheelchair locomotion today with his UE now that his hand is feeling better. He was overall mod A for slideboard transfer wheel chair to mat table, but then was overall close supervision for table back to wheelchair without use of slideboad. He still  requires total assist for sit to stand transfers but was able to achieve more knee ext with standing today and stood in the steady longer with more upright posture today. He continues to get aggitated with standing activity to  then performed seated therex after. He will need progress note next session.    Personal Factors and Comorbidities  Age;Comorbidity 1;Comorbidity 2;Comorbidity 3+;Past/Current Experience;Fitness    Comorbidities  PMH: recent hospitilization 05/22/19 atrial fibrillation, dementia, essential hypertension, HLD, diastolic congestive heart failure, aortic valve regurgitation, BPH, and recent small bowel obstruction with recent surgical intervention with lysis of adhesions    Examination-Activity Limitations  --   all ADL's   Examination-Participation Restrictions  --   All   Stability/Clinical Decision Making  Evolving/Moderate complexity    Rehab Potential  Fair    PT Frequency  Other (comment)   2-3   PT Duration  12 weeks    PT Treatment/Interventions  ADLs/Self Care Home Management;Aquatic Therapy;Cryotherapy;Moist Heat;DME Instruction;Gait training;Functional mobility training;Therapeutic activities;Therapeutic exercise;Balance training;Neuromuscular re-education;Manual techniques;Wheelchair mobility training;Patient/family education;Passive range of motion;Taping    PT Next Visit Plan  scift from wheelchair to warm up; continue to focus on self wheelchair propulsion, slide board transfer to/from high/low table, from high low table try stedy lift assist with mat table elevated with 2 person assist. allow time for pt to self initaiate once cued on step by step what is expected of him. continue to work on unsupported sitting posture and balance as well.    PT Home Exercise Plan  LAQ, sitting marches, sitting hamstring curls, SLR    Consulted and Agree with Plan of Care  Patient;Family member/caregiver    Family Member Consulted  wife       Patient will benefit from skilled  therapeutic intervention in order to improve the following deficits and impairments:  Decreased balance, Abnormal gait, Decreased endurance, Decreased range of motion, Decreased strength, Decreased mobility, Difficulty walking, Postural dysfunction, Pain  Visit Diagnosis: Muscle weakness (generalized)  Other abnormalities of gait and mobility  Chronic pain of left knee     Problem List Patient Active Problem List   Diagnosis Date Noted  . Bacteremia due to Enterococcus 05/24/2019  . Urinary tract infection 05/24/2019  . Chronic diastolic CHF (congestive heart failure) ([place]) 05/22/2019  . Gout 05/22/2019  . Sepsis ([place]) 05/22/2019  . Protein-calorie malnutrition, severe 05/18/2019  . AKI (acute kidney injury) ([place]) 05/05/2019  . Transient hypotension 05/05/2019  . Sepsis, Gram negative ([place]) 03/18/2019  . Hypoalbuminemia   . Leukocytosis   . Hyperglycemia   . Persistent atrial fibrillation with RVR   . Acute on chronic anemia   . Rupture of left patellar tendon 02/15/2019  . Postoperative pain   . Bipolar 1 disorder ([place]) 02/14/2019  . Dementia without behavioral disturbance ([place]) 02/14/2019  . Patellar tendon rupture, left, initial encounter 02/10/2019  . SIRS (systemic inflammatory response syndrome) ([place]) 02/09/2019  . Osteomyelitis ([place]) 02/09/2019  . Acute blood loss anemia 02/09/2019  . Aortic valve regurgitation 10/16/2018  . SBO (small bowel obstruction) ([place]) 07/08/2013  . Hyperlipidemia 12/20/2011  . HTN (hypertension) 06/04/2011  . Atrial fibrillation, chronic 03/01/2011  . Long term (current) use of anticoagulants 03/01/2011  . GERD 10/11/2009  . DIVERTICULOSIS OF COLON 10/11/2009    Debbe Odea, PT,DPT 08/09/2019, 3:30 PM  [facility] [address] [place], [place], [address] Phone: [phone]   Fax:  [phone]  Name: Russell Dillon MRN: [id number] Date of Birth: [birth year]

## 2019-08-11 ENCOUNTER — Other Ambulatory Visit: Payer: Self-pay

## 2019-08-11 ENCOUNTER — Ambulatory Visit: Payer: Medicare Other | Admitting: Physical Therapy

## 2019-08-11 ENCOUNTER — Telehealth: Payer: Self-pay | Admitting: Licensed Clinical Social Worker

## 2019-08-11 DIAGNOSIS — R2689 Other abnormalities of gait and mobility: Secondary | ICD-10-CM

## 2019-08-11 DIAGNOSIS — M6281 Muscle weakness (generalized): Secondary | ICD-10-CM

## 2019-08-11 DIAGNOSIS — M25562 Pain in left knee: Secondary | ICD-10-CM

## 2019-08-11 DIAGNOSIS — G8929 Other chronic pain: Secondary | ICD-10-CM

## 2019-08-11 NOTE — Therapy (Signed)
[facility] [address] [place], [place], [address] Phone: [phone]   Fax:  [phone]  Physical Therapy Treatment/Progress note Progress Note reporting period 07/08/19 to 08/11/19  See below for objective and subjective measurements relating to patients progress with PT.   Patient Details  Name: Russell Dillon MRN: [id number] Date of Birth: 05/12/1946 Referring Provider (PT): Marton Redwood, MD   Encounter Date: 08/11/2019  PT End of Session - 08/11/19 1648    Visit Number  10    Number of Visits  24    Date for PT Re-Evaluation  09/30/19    Authorization Type  MCR, AARP    PT Start Time  2703    PT Stop Time  1615    PT Time Calculation (min)  45 min    Equipment Utilized During Treatment  Other (comment)   standing frame, stedy lift assist, did not attempt gait belt today   Activity Tolerance  Patient limited by fatigue;Treatment limited secondary to agitation   pt agitated when attempting the standing frame a second time, better when changed to another activity   Behavior During Therapy  Flat affect;Agitated;WFL for tasks assessed/performed   mostly cooperative today      Past Medical History:  Diagnosis Date  . Acute metabolic encephalopathy 5/00/9381  . Aortic valve regurgitation 10/16/2018   Echo 07/28/2017:  EF 60-65, moderate AI, aortic root and ascending aorta mildly dilated (40 mm), ascending aorta 43 mm, MAC, mild MR, mild LAE, moderate RV enlargement, trivial TR // Echo 12/19: EF 60-65, normal wall motion, mild AI, moderate BAE, ascending aorta 43 mm, aortic root 39 mm  . BPH (benign prostatic hyperplasia)   . BPH (benign prostatic hypertrophy)   . Chronic anticoagulation   . Chronic atrial fibrillation   . Diastolic dysfunction October 2010   Normal LV systolic function  . Hx SBO   . Hyperlipidemia   . Hypertension   . Patellar tendon rupture, left, initial encounter 02/10/2019  . Severe  sepsis ([place]) 05/05/2019  . Small bowel obstruction [facility])     Past Surgical History:  Procedure Laterality Date  . APPENDECTOMY  2004  . APPLICATION OF WOUND VAC Left 02/11/2019   Procedure: Application Of Wound Vac;  Surgeon: Altamese Amboy, MD;  Location: [facility];  Service: Orthopedics;  Laterality: Left;  . CARDIOVASCULAR STRESS TEST  09/30/2007   EF 67%  No ischemia.   Marland Kitchen CARDIOVERSION  05/01/2004  . KNEE ARTHROSCOPY Left 02/11/2019   Procedure: ARTHROSCOPY LEFT KNEE;  Surgeon: Altamese Stonegate, MD;  Location: [facility];  Service: Orthopedics;  Laterality: Left;  . LAPAROTOMY N/A 05/11/2019   Procedure: EXPLORATORY LAPAROTOMY;  Surgeon: Rolm Bookbinder, MD;  Location: [facility];  Service: General;  Laterality: N/A;  . LYSIS OF ADHESION N/A 05/11/2019   Procedure: LYSIS OF ADHESION;  Surgeon: Rolm Bookbinder, MD;  Location: [facility];  Service: General;  Laterality: N/A;  . PATELLAR TENDON REPAIR Left 02/11/2019   Procedure: LEFT PATELLA TENDON REPAIR;  Surgeon: Altamese Hobbs, MD;  Location: [facility];  Service: Orthopedics;  Laterality: Left;  . SMALL INTESTINE SURGERY  2004  . US ECHOCARDIOGRAPHY  09/05/2009   ef 55-60%    There were no vitals filed for this visit.  Subjective Assessment - 08/11/19 1641    Subjective  he relays "I am here", denies pain upon arrival.       Treatment/Interventions  Sci fit 10 min UE and LE for endurance and ROM  Self  wheelchair propulsion with manual facilitation of hands on rails and visual and verbal cues but then able to perform with just min A for turning 25 ft X 3  Wheelchair to mat table transfer with slideboard mod A +1 only today then at end performed mat to wheelchair seated scoot transfer supervision  Sit to stand X 5 total A +2, (4 times with +2 assist then one time with steady (performed additional 2 partial sit to stands while in steady and stayed in steady about 5 min before becoming agitated and ending the session)  Seated balance no UE support  for about 5 min total  seated scap retraction red X 20  Seated LAQ and marches with 3 lbs X 20 ea, seated ankle DF X 15 each side (does not lift his Lt side as well)    PT Short Term Goals - 08/11/19 1700      PT SHORT TERM GOAL #1   Title  Pt will be I and compliant with initial HEP. (target for STG 6 weeks 10/9)    Baseline  wife says he is compliant with caregiver guidance    Status  Achieved      PT SHORT TERM GOAL #2   Title  Pt will be able to perform sit to stand with mod assist.    Baseline  total    Status  On-going        PT Long Term Goals - 08/11/19 1700      PT LONG TERM GOAL #1   Title  Pt will be I and compliant with final HEP. (target for all LTG 12 weeks 09/30/19)    Status  On-going      PT LONG TERM GOAL #2   Title  Pt will be able to perform sit to stand mod I.    Baseline  total    Status  On-going      PT LONG TERM GOAL #3   Title  Pt will be able perform stand pivot transfers min A to decrease caregiver burden.    Baseline  total, can now perform slideboard transfer min to mod A and may neeed to adjust goal for this    Status  On-going      PT LONG TERM GOAL #4   Title  Pt will be able to tolerate standing for 5 min with UE support.    Baseline  can not achieve upright    Status  On-going      PT LONG TERM GOAL #5   Title  Pt will improve LE strength to at least 4/5 MMT bilat    Baseline  2-3    Status  On-going            Plan - 08/11/19 1648    Clinical Impression Statement  he was able to show some carryover from last session with propelling his wheelchair himself with hands and feet but needs manual faciliation for where to put his hands on the rims. Able to transfer with slideboard today with his best performance only needing one person mod A from chair to mat table then supervision for mat table back to chair. Saved standing for the end as he gets agitated easily still with standing activity. He is overall progressing slowly but  is making some progress with transfers and wheelchair propulsion. He will continue to benefit from skilled PT to progress functional mobility and decreased caregiver burden   Personal Factors and Comorbidities  Age;Comorbidity 1;Comorbidity 2;Comorbidity 3+;Past/Current  Experience;Fitness    Comorbidities  PMH: recent hospitilization 05/22/19 atrial fibrillation, dementia, essential hypertension, HLD, diastolic congestive heart failure, aortic valve regurgitation, BPH, and recent small bowel obstruction with recent surgical intervention with lysis of adhesions    Examination-Activity Limitations  --   all ADL's   Examination-Participation Restrictions  --   All   Stability/Clinical Decision Making  Evolving/Moderate complexity    Rehab Potential  Fair    PT Frequency  Other (comment)   2-3   PT Duration  12 weeks    PT Treatment/Interventions  ADLs/Self Care Home Management;Aquatic Therapy;Cryotherapy;Moist Heat;DME Instruction;Gait training;Functional mobility training;Therapeutic activities;Therapeutic exercise;Balance training;Neuromuscular re-education;Manual techniques;Wheelchair mobility training;Patient/family education;Passive range of motion;Taping    PT Next Visit Plan  scift from wheelchair to warm up; continue to focus on self wheelchair propulsion, slide board transfer to/from high/low table, from high low table try stedy lift assist with mat table elevated with 2 person assist. allow time for pt to self initaiate once cued on step by step what is expected of him. continue to work on unsupported sitting posture and balance as well.    PT Home Exercise Plan  LAQ, sitting marches, sitting hamstring curls, SLR    Consulted and Agree with Plan of Care  Patient;Family member/caregiver    Family Member Consulted  wife       Patient will benefit from skilled therapeutic intervention in order to improve the following deficits and impairments:  Decreased balance, Abnormal gait, Decreased  endurance, Decreased range of motion, Decreased strength, Decreased mobility, Difficulty walking, Postural dysfunction, Pain  Visit Diagnosis: Muscle weakness (generalized)  Other abnormalities of gait and mobility  Chronic pain of left knee     Problem List Patient Active Problem List   Diagnosis Date Noted  . Bacteremia due to Enterococcus 05/24/2019  . Urinary tract infection 05/24/2019  . Chronic diastolic CHF (congestive heart failure) ([place]) 05/22/2019  . Gout 05/22/2019  . Sepsis ([place]) 05/22/2019  . Protein-calorie malnutrition, severe 05/18/2019  . AKI (acute kidney injury) ([place]) 05/05/2019  . Transient hypotension 05/05/2019  . Sepsis, Gram negative ([place]) 03/18/2019  . Hypoalbuminemia   . Leukocytosis   . Hyperglycemia   . Persistent atrial fibrillation with RVR   . Acute on chronic anemia   . Rupture of left patellar tendon 02/15/2019  . Postoperative pain   . Bipolar 1 disorder ([place]) 02/14/2019  . Dementia without behavioral disturbance ([place]) 02/14/2019  . Patellar tendon rupture, left, initial encounter 02/10/2019  . SIRS (systemic inflammatory response syndrome) ([place]) 02/09/2019  . Osteomyelitis ([place]) 02/09/2019  . Acute blood loss anemia 02/09/2019  . Aortic valve regurgitation 10/16/2018  . SBO (small bowel obstruction) ([place]) 07/08/2013  . Hyperlipidemia 12/20/2011  . HTN (hypertension) 06/04/2011  . Atrial fibrillation, chronic 03/01/2011  . Long term (current) use of anticoagulants 03/01/2011  . GERD 10/11/2009  . DIVERTICULOSIS OF COLON 10/11/2009    Silvestre Mesi 08/11/2019, 5:05 PM  [facility] [address] [place], [place], [address] Phone: [phone]   Fax:  [phone]  Name: [place] Dillon MRN: [id number] Date of Birth: 07-29-1946

## 2019-08-11 NOTE — Telephone Encounter (Signed)
Palliative Care SW left a vm with patient's wife, [name], to schedule a visit.

## 2019-08-16 ENCOUNTER — Ambulatory Visit: Payer: Medicare Other | Attending: Internal Medicine | Admitting: Physical Therapy

## 2019-08-16 ENCOUNTER — Other Ambulatory Visit: Payer: Self-pay

## 2019-08-16 DIAGNOSIS — M25562 Pain in left knee: Secondary | ICD-10-CM | POA: Diagnosis not present

## 2019-08-16 DIAGNOSIS — G8929 Other chronic pain: Secondary | ICD-10-CM | POA: Diagnosis not present

## 2019-08-16 DIAGNOSIS — R2689 Other abnormalities of gait and mobility: Secondary | ICD-10-CM | POA: Diagnosis not present

## 2019-08-16 DIAGNOSIS — M6281 Muscle weakness (generalized): Secondary | ICD-10-CM

## 2019-08-16 NOTE — Therapy (Signed)
[facility] [address] [place], [place], [address] Phone: [phone]   Fax:  [phone]  Physical Therapy Treatment  Patient Details  Name: Russell Dillon MRN: [id number] Date of Birth: 06/18/1946 Referring Provider (PT): Marton Redwood, MD   Encounter Date: 08/16/2019  PT End of Session - 08/16/19 1815    Visit Number  11    Number of Visits  24    Date for PT Re-Evaluation  09/30/19    Authorization Type  MCR, AARP    PT Start Time  5498    PT Stop Time  1615    PT Time Calculation (min)  40 min    Equipment Utilized During Treatment  Other (comment);Gait belt    Activity Tolerance  Patient limited by fatigue;Treatment limited secondary to agitation    Behavior During Therapy  Flat affect;Agitated;WFL for tasks assessed/performed   mostly cooperative today      Past Medical History:  Diagnosis Date  . Acute metabolic encephalopathy 2/64/1583  . Aortic valve regurgitation 10/16/2018   Echo 07/28/2017:  EF 60-65, moderate AI, aortic root and ascending aorta mildly dilated (40 mm), ascending aorta 43 mm, MAC, mild MR, mild LAE, moderate RV enlargement, trivial TR // Echo 12/19: EF 60-65, normal wall motion, mild AI, moderate BAE, ascending aorta 43 mm, aortic root 39 mm  . BPH (benign prostatic hyperplasia)   . BPH (benign prostatic hypertrophy)   . Chronic anticoagulation   . Chronic atrial fibrillation   . Diastolic dysfunction October 2010   Normal LV systolic function  . Hx SBO   . Hyperlipidemia   . Hypertension   . Patellar tendon rupture, left, initial encounter 02/10/2019  . Severe sepsis ([place]) 05/05/2019  . Small bowel obstruction [facility])     Past Surgical History:  Procedure Laterality Date  . APPENDECTOMY  2004  . APPLICATION OF WOUND VAC Left 02/11/2019   Procedure: Application Of Wound Vac;  Surgeon: Altamese Leesville, MD;  Location: [facility];  Service: Orthopedics;  Laterality: Left;  .  CARDIOVASCULAR STRESS TEST  09/30/2007   EF 67%  No ischemia.   Marland Kitchen CARDIOVERSION  05/01/2004  . KNEE ARTHROSCOPY Left 02/11/2019   Procedure: ARTHROSCOPY LEFT KNEE;  Surgeon: Altamese [place], MD;  Location: [facility];  Service: Orthopedics;  Laterality: Left;  . LAPAROTOMY N/A 05/11/2019   Procedure: EXPLORATORY LAPAROTOMY;  Surgeon: Rolm Bookbinder, MD;  Location: [facility];  Service: General;  Laterality: N/A;  . LYSIS OF ADHESION N/A 05/11/2019   Procedure: LYSIS OF ADHESION;  Surgeon: Rolm Bookbinder, MD;  Location: [facility];  Service: General;  Laterality: N/A;  . PATELLAR TENDON REPAIR Left 02/11/2019   Procedure: LEFT PATELLA TENDON REPAIR;  Surgeon: Altamese Stanton, MD;  Location: [facility];  Service: Orthopedics;  Laterality: Left;  . SMALL INTESTINE SURGERY  2004  . US ECHOCARDIOGRAPHY  09/05/2009   ef 55-60%    There were no vitals filed for this visit.  Subjective Assessment - 08/16/19 2030    Subjective  Wife states he has been doing the exercises 3x per day.    Pertinent History  PMH: recent hospitilization 05/22/19 atrial fibrillation, dementia, essential hypertension, HLD, diastolic congestive heart failure, aortic valve regurgitation, BPH, and recent small bowel obstruction with recent surgical intervention with lysis of adhesions    Limitations  Walking;Standing    How long can you stand comfortably?  can not stand on his own    How long can you walk comfortably?  can  not walk on his own    Patient Stated Goals  unable to state, his wife would like to see him walk again    Currently in Pain?  No/denies                       [facility] Adult PT Treatment/Exercise - 08/16/19 0001      Transfers   Comments  Lateral squat pivot transfers: level transfer from w/c to mat table -mod-max A x2, took multiple attempts, verbal and visual cues for pt to lean forward towards therapist's shoulder for anterior weight shift, pt unable to hold this position throughout transfer. From mat table  to w/c, pt initiated transfer (mat table slightly higher than w/c for gravity to assist) -with min guard/min A x1. When back in w/c pt needing verbal and visual cues for weight shifting anteriorly for max A to scoot laterally in w/c for proper positioning.       Therapeutic Activites    Other Therapeutic Activities  W/c mobility: 95' around therapy gym propelling with BUE and BLE, needed min A for performing around turns       Exercises   Exercises  Other Exercises    Other Exercises   Seated at edge of mat table for unsupported balance and upright posture: practiced reaching and tapping cones with BUE (increased difficulty with RUE) able to perform approx. 4 reps before pt getting agitated and saying "I'm not doing that" - also attempted to use cone for pt to look up and spot it to help promote cervical and trunk extension. At edge of mat pt willing to participate in LAQs 1 x 10 reps with visual cueing from therapist to kick towards her hand as a target, cues for slower controlled movement. 1 x 10 reps hip ADD pillow squeezes - pt at this time refusing any more activity and not willing to attempt standing, transfer initiated back to w/c.       Knee/Hip Exercises: Aerobic   Other Aerobic  Scifit level 1.2 x10 minutes pt able to maintain a steady pace with no cues - performed from wheelchair with incline blocks behind wheels to prevent W/C from moving.                 PT Short Term Goals - 08/11/19 1700      PT SHORT TERM GOAL #1   Title  Pt will be I and compliant with initial HEP. (target for STG 6 weeks 10/9)    Baseline  wife says he is compliant with caregiver guidance    Status  Achieved      PT SHORT TERM GOAL #2   Title  Pt will be able to perform sit to stand with mod assist.    Baseline  total    Status  On-going        PT Long Term Goals - 08/11/19 1700      PT LONG TERM GOAL #1   Title  Pt will be I and compliant with final HEP. (target for all LTG 12 weeks  09/30/19)    Status  On-going      PT LONG TERM GOAL #2   Title  Pt will be able to perform sit to stand mod I.    Baseline  total    Status  On-going      PT LONG TERM GOAL #3   Title  Pt will be able perform stand pivot transfers min A to decrease  caregiver burden.    Baseline  total, can now perform slideboard transfer min to mod A and may neeed to adjust goal for this    Status  On-going      PT LONG TERM GOAL #4   Title  Pt will be able to tolerate standing for 5 min with UE support.    Baseline  can not achieve upright    Status  On-going      PT LONG TERM GOAL #5   Title  Pt will improve LE strength to at least 4/5 MMT bilat    Baseline  2-3    Status  On-going            Plan - 08/16/19 2031    Clinical Impression Statement  Pt able to demonstrate carryover this session by being able to propel his w/c with BUE and BLE for 75' around the gym, min A needed to navigate turns. Able to perform the SciFit today with BLE and BUE for 10 minutes for improved coordination, strength and endurance without needing cues. Pt needed mod-max A for lateral scoot squat pivot transfer from w/c > mat table, when transferring back to w/c pt able to initiate transfer and only required min guard/min A. Pt agitated after seated LE exercises and pt not wanting to participate in standing today. Will continue to progress towards LTGs.    Personal Factors and Comorbidities  Age;Comorbidity 1;Comorbidity 2;Comorbidity 3+;Past/Current Experience;Fitness    Comorbidities  PMH: recent hospitilization 05/22/19 atrial fibrillation, dementia, essential hypertension, HLD, diastolic congestive heart failure, aortic valve regurgitation, BPH, and recent small bowel obstruction with recent surgical intervention with lysis of adhesions    Examination-Activity Limitations  --   all ADL's   Examination-Participation Restrictions  --   All   Stability/Clinical Decision Making  Evolving/Moderate complexity    Rehab  Potential  Fair    PT Frequency  Other (comment)   2-3   PT Duration  12 weeks    PT Treatment/Interventions  ADLs/Self Care Home Management;Aquatic Therapy;Cryotherapy;Moist Heat;DME Instruction;Gait training;Functional mobility training;Therapeutic activities;Therapeutic exercise;Balance training;Neuromuscular re-education;Manual techniques;Wheelchair mobility training;Patient/family education;Passive range of motion;Taping    PT Next Visit Plan  scift from wheelchair to warm up; continue to focus on self wheelchair propulsion, slide board transfer to/from high/low table, from high low table try stedy lift assist with mat table elevated with 2 person assist. allow time for pt to self initaiate once cued on step by step what is expected of him. continue to work on unsupported sitting posture and balance as well.    PT Home Exercise Plan  LAQ, sitting marches, sitting hamstring curls, SLR    Consulted and Agree with Plan of Care  Patient;Family member/caregiver    Family Member Consulted  wife       Patient will benefit from skilled therapeutic intervention in order to improve the following deficits and impairments:  Decreased balance, Abnormal gait, Decreased endurance, Decreased range of motion, Decreased strength, Decreased mobility, Difficulty walking, Postural dysfunction, Pain  Visit Diagnosis: Muscle weakness (generalized)  Other abnormalities of gait and mobility  Chronic pain of left knee     Problem List Patient Active Problem List   Diagnosis Date Noted  . Bacteremia due to Enterococcus 05/24/2019  . Urinary tract infection 05/24/2019  . Chronic diastolic CHF (congestive heart failure) ([place]) 05/22/2019  . Gout 05/22/2019  . Sepsis ([place]) 05/22/2019  . Protein-calorie malnutrition, severe 05/18/2019  . AKI (acute kidney injury) ([place]) 05/05/2019  . Transient hypotension 05/05/2019  .  Sepsis, Gram negative ([place]) 03/18/2019  . Hypoalbuminemia   . Leukocytosis   .  Hyperglycemia   . Persistent atrial fibrillation with RVR ([place])   . Acute on chronic anemia   . Rupture of left patellar tendon 02/15/2019  . Postoperative pain   . Bipolar 1 disorder ([place]) 02/14/2019  . Dementia without behavioral disturbance ([place]) 02/14/2019  . Patellar tendon rupture, left, initial encounter 02/10/2019  . SIRS (systemic inflammatory response syndrome) ([place]) 02/09/2019  . Osteomyelitis ([place]) 02/09/2019  . Acute blood loss anemia 02/09/2019  . Aortic valve regurgitation 10/16/2018  . SBO (small bowel obstruction) ([place]) 07/08/2013  . Hyperlipidemia 12/20/2011  . HTN (hypertension) 06/04/2011  . Atrial fibrillation, chronic ([place]) 03/01/2011  . Long term (current) use of anticoagulants 03/01/2011  . GERD 10/11/2009  . DIVERTICULOSIS OF COLON 10/11/2009    Arliss Journey, PT, DPT 08/16/2019, 8:40 PM  [facility] [address] [place], [place], [address] Phone: [phone]   Fax:  [phone]  Name: [place] Dillon MRN: [id number] Date of Birth: 24-Jun-1946

## 2019-08-18 ENCOUNTER — Encounter: Payer: Self-pay | Admitting: Physical Therapy

## 2019-08-18 ENCOUNTER — Other Ambulatory Visit: Payer: Self-pay

## 2019-08-18 ENCOUNTER — Ambulatory Visit: Payer: Medicare Other | Admitting: Physical Therapy

## 2019-08-18 DIAGNOSIS — M25562 Pain in left knee: Secondary | ICD-10-CM

## 2019-08-18 DIAGNOSIS — M6281 Muscle weakness (generalized): Secondary | ICD-10-CM | POA: Diagnosis not present

## 2019-08-18 DIAGNOSIS — G8929 Other chronic pain: Secondary | ICD-10-CM

## 2019-08-18 DIAGNOSIS — R2689 Other abnormalities of gait and mobility: Secondary | ICD-10-CM

## 2019-08-20 DIAGNOSIS — Z7901 Long term (current) use of anticoagulants: Secondary | ICD-10-CM | POA: Diagnosis not present

## 2019-08-20 DIAGNOSIS — I480 Paroxysmal atrial fibrillation: Secondary | ICD-10-CM | POA: Diagnosis not present

## 2019-08-20 NOTE — Therapy (Signed)
[facility] [address] [place], [place], [address] Phone: [phone]   Fax:  [phone]  Physical Therapy Treatment  Patient Details  Name: Russell Dillon MRN: [id number] Date of Birth: 1945-11-30 Referring Provider (PT): Marton Redwood, MD   Encounter Date: 08/18/2019     08/18/19 1456  PT Visits / Re-Eval  Visit Number 12  Number of Visits 24  Date for PT Re-Evaluation 09/30/19  Authorization  Authorization Type MCR, AARP  PT Time Calculation  PT Start Time 8250  PT Stop Time 1525  PT Time Calculation (min) 38 min  PT - End of Session  Equipment Utilized During Treatment Other (comment);Gait belt  Activity Tolerance Patient limited by fatigue;Treatment limited secondary to agitation  Behavior During Therapy Flat affect;Agitated;WFL for tasks assessed/performed (mostly cooperative today)    Past Medical History:  Diagnosis Date  . Acute metabolic encephalopathy 0/37/0488  . Aortic valve regurgitation 10/16/2018   Echo 07/28/2017:  EF 60-65, moderate AI, aortic root and ascending aorta mildly dilated (40 mm), ascending aorta 43 mm, MAC, mild MR, mild LAE, moderate RV enlargement, trivial TR // Echo 12/19: EF 60-65, normal wall motion, mild AI, moderate BAE, ascending aorta 43 mm, aortic root 39 mm  . BPH (benign prostatic hyperplasia)   . BPH (benign prostatic hypertrophy)   . Chronic anticoagulation   . Chronic atrial fibrillation ([place])   . Diastolic dysfunction October 2010   Normal LV systolic function  . Hx SBO   . Hyperlipidemia   . Hypertension   . Patellar tendon rupture, left, initial encounter 02/10/2019  . Severe sepsis ([place]) 05/05/2019  . Small bowel obstruction [facility])     Past Surgical History:  Procedure Laterality Date  . APPENDECTOMY  2004  . APPLICATION OF WOUND VAC Left 02/11/2019   Procedure: Application Of Wound Vac;  Surgeon: Altamese Clifton, MD;  Location: [facility];  Service:  Orthopedics;  Laterality: Left;  . CARDIOVASCULAR STRESS TEST  09/30/2007   EF 67%  No ischemia.   Marland Kitchen CARDIOVERSION  05/01/2004  . KNEE ARTHROSCOPY Left 02/11/2019   Procedure: ARTHROSCOPY LEFT KNEE;  Surgeon: Altamese Merrimac, MD;  Location: [facility];  Service: Orthopedics;  Laterality: Left;  . LAPAROTOMY N/A 05/11/2019   Procedure: EXPLORATORY LAPAROTOMY;  Surgeon: Rolm Bookbinder, MD;  Location: [facility];  Service: General;  Laterality: N/A;  . LYSIS OF ADHESION N/A 05/11/2019   Procedure: LYSIS OF ADHESION;  Surgeon: Rolm Bookbinder, MD;  Location: [facility];  Service: General;  Laterality: N/A;  . PATELLAR TENDON REPAIR Left 02/11/2019   Procedure: LEFT PATELLA TENDON REPAIR;  Surgeon: Altamese Howard, MD;  Location: [facility];  Service: Orthopedics;  Laterality: Left;  . SMALL INTESTINE SURGERY  2004  . US ECHOCARDIOGRAPHY  09/05/2009   ef 55-60%    There were no vitals filed for this visit.     08/18/19 1454  Symptoms/Limitations  Subjective Spouse reports he did compain of some right hip pain with rolling this morning when getting ready to get up. Pt denies any pain at this time. "I'm good". No falls. Spouse reports doing HEP a couple times a day.  Pertinent History PMH: recent hospitilization 05/22/19 atrial fibrillation, dementia, essential hypertension, HLD, diastolic congestive heart failure, aortic valve regurgitation, BPH, and recent small bowel obstruction with recent surgical intervention with lysis of adhesions  Limitations Walking;Standing  How long can you stand comfortably? can not stand on his own  How long can you walk comfortably? can not walk  on his own  Patient Stated Goals unable to state, his wife would like to see him walk again  Pain Assessment  Currently in Pain? No/denies  Pain Score 0      08/18/19 1830  Wheelchair Mobility  Wheelchair Mobility Yes  Wheelchair Assistance 4: Min guard;4: Market researcher cues for Paediatric nurse Both upper extremities  Distance 30 (x1, 115 x1, 70 x1)  Comments from scifit to mat table, then around gym track, then from mat to door to lobby. pt able to use bil UE's to prope w/c with cues for "big" pushes. occasional assistance to turn/avoid objects.    Exercises  Exercises Other Exercises  Other Exercises  seated in wheelchair: long arc quads for 20 reps each side; yellow band resistance- hamstring curls, marching, single leg clamshell for 20 reps, then pillow squeezes for 5 sec holds. All performed on bil LE's. cues for ex form/technique and to stay on task.   Knee/Hip Exercises: Aerobic  Other Aerobic Scifit level 2.0 x10 minutes pt able to maintain a steady pace with no cues - performed from wheelchair with incline blocks behind wheels to prevent W/C from moving.         PT Short Term Goals - 08/11/19 1700      PT SHORT TERM GOAL #1   Title  Pt will be I and compliant with initial HEP. (target for STG 6 weeks 10/9)    Baseline  wife says he is compliant with caregiver guidance    Status  Achieved      PT SHORT TERM GOAL #2   Title  Pt will be able to perform sit to stand with mod assist.    Baseline  total    Status  On-going        PT Long Term Goals - 08/11/19 1700      PT LONG TERM GOAL #1   Title  Pt will be I and compliant with final HEP. (target for all LTG 12 weeks 09/30/19)    Status  On-going      PT LONG TERM GOAL #2   Title  Pt will be able to perform sit to stand mod I.    Baseline  total    Status  On-going      PT LONG TERM GOAL #3   Title  Pt will be able perform stand pivot transfers min A to decrease caregiver burden.    Baseline  total, can now perform slideboard transfer min to mod A and may neeed to adjust goal for this    Status  On-going      PT LONG TERM GOAL #4   Title  Pt will be able to tolerate standing for 5 min with UE support.    Baseline  can not achieve upright    Status  On-going      PT LONG TERM  GOAL #5   Title  Pt will improve LE strength to at least 4/5 MMT bilat    Baseline  2-3    Status  On-going         08/18/19 1456  Plan  Clinical Impression Statement Today's skilled session continued to focus on strengthening and wheelchair propulsion by patient. Increased wheelchair distance today with up to min assist at times for navigation of obstacles. Pt adamantly against performing slide board transfer today, however with redirection/cues/encouragement pt agreed to perform LE ex's seated in wheelchair. No pain reported with session.  Pt mildly agitated, however did participate with strengthening and wheelchair activiites. When pushed to use slide board or work on transfers pt became agitated, therefore did not push this further. The pt is making slow, limited progress toward goals.  Personal Factors and Comorbidities Age;Comorbidity 1;Comorbidity 2;Comorbidity 3+;Past/Current Experience;Fitness  Comorbidities PMH: recent hospitilization 05/22/19 atrial fibrillation, dementia, essential hypertension, HLD, diastolic congestive heart failure, aortic valve regurgitation, BPH, and recent small bowel obstruction with recent surgical intervention with lysis of adhesions  Examination-Activity Limitations  (all ADL's)  Examination-Participation Restrictions  (All)  Pt will benefit from skilled therapeutic intervention in order to improve on the following deficits Decreased balance;Abnormal gait;Decreased endurance;Decreased range of motion;Decreased strength;Decreased mobility;Difficulty walking;Postural dysfunction;Pain  Stability/Clinical Decision Making Evolving/Moderate complexity  Rehab Potential Fair  PT Frequency Other (comment) (2-3)  PT Duration 12 weeks  PT Treatment/Interventions ADLs/Self Care Home Management;Aquatic Therapy;Cryotherapy;Moist Heat;DME Instruction;Gait training;Functional mobility training;Therapeutic activities;Therapeutic exercise;Balance training;Neuromuscular  re-education;Manual techniques;Wheelchair mobility training;Patient/family education;Passive range of motion;Taping  PT Next Visit Plan scift from wheelchair to warm up; continue to focus on self wheelchair propulsion, slide board transfer to/from high/low table, from high low table try stedy lift assist with mat table elevated with 2 person assist. allow time for pt to self initaiate once cued on step by step what is expected of him. continue to work on unsupported sitting posture and balance as well.  PT Home Exercise Plan LAQ, sitting marches, sitting hamstring curls, SLR  Consulted and Agree with Plan of Care Patient;Family member/caregiver  Family Member Consulted wife          Patient will benefit from skilled therapeutic intervention in order to improve the following deficits and impairments:  Decreased balance, Abnormal gait, Decreased endurance, Decreased range of motion, Decreased strength, Decreased mobility, Difficulty walking, Postural dysfunction, Pain  Visit Diagnosis: Muscle weakness (generalized)  Other abnormalities of gait and mobility  Chronic pain of left knee     Problem List Patient Active Problem List   Diagnosis Date Noted  . Bacteremia due to Enterococcus 05/24/2019  . Urinary tract infection 05/24/2019  . Chronic diastolic CHF (congestive heart failure) ([place]) 05/22/2019  . Gout 05/22/2019  . Sepsis ([place]) 05/22/2019  . Protein-calorie malnutrition, severe 05/18/2019  . AKI (acute kidney injury) ([place]) 05/05/2019  . Transient hypotension 05/05/2019  . Sepsis, Gram negative ([place]) 03/18/2019  . Hypoalbuminemia   . Leukocytosis   . Hyperglycemia   . Persistent atrial fibrillation with RVR ([place])   . Acute on chronic anemia   . Rupture of left patellar tendon 02/15/2019  . Postoperative pain   . Bipolar 1 disorder ([place]) 02/14/2019  . Dementia without behavioral disturbance ([place]) 02/14/2019  . Patellar tendon rupture, left, initial encounter 02/10/2019   . SIRS (systemic inflammatory response syndrome) ([place]) 02/09/2019  . Osteomyelitis ([place]) 02/09/2019  . Acute blood loss anemia 02/09/2019  . Aortic valve regurgitation 10/16/2018  . SBO (small bowel obstruction) ([place]) 07/08/2013  . Hyperlipidemia 12/20/2011  . HTN (hypertension) 06/04/2011  . Atrial fibrillation, chronic ([place]) 03/01/2011  . Long term (current) use of anticoagulants 03/01/2011  . GERD 10/11/2009  . DIVERTICULOSIS OF COLON 10/11/2009    Willow Ora, PTA, [facility] [address], [place], [place] [address] [phone] 08/20/19, 9:08 AM   Name: AVIRAJ KENTNER MRN: [id number] Date of Birth: [birth year]

## 2019-08-22 IMAGING — DX ABDOMEN - 1 VIEW
1 series · 1 of 1 positions shown · non-contrast
Comparison: CT abdomen and pelvis 05/05/2019

CLINICAL DATA: Small bowel obstruction.

EXAM:
ABDOMEN - 1 VIEW

[abdomen kub]
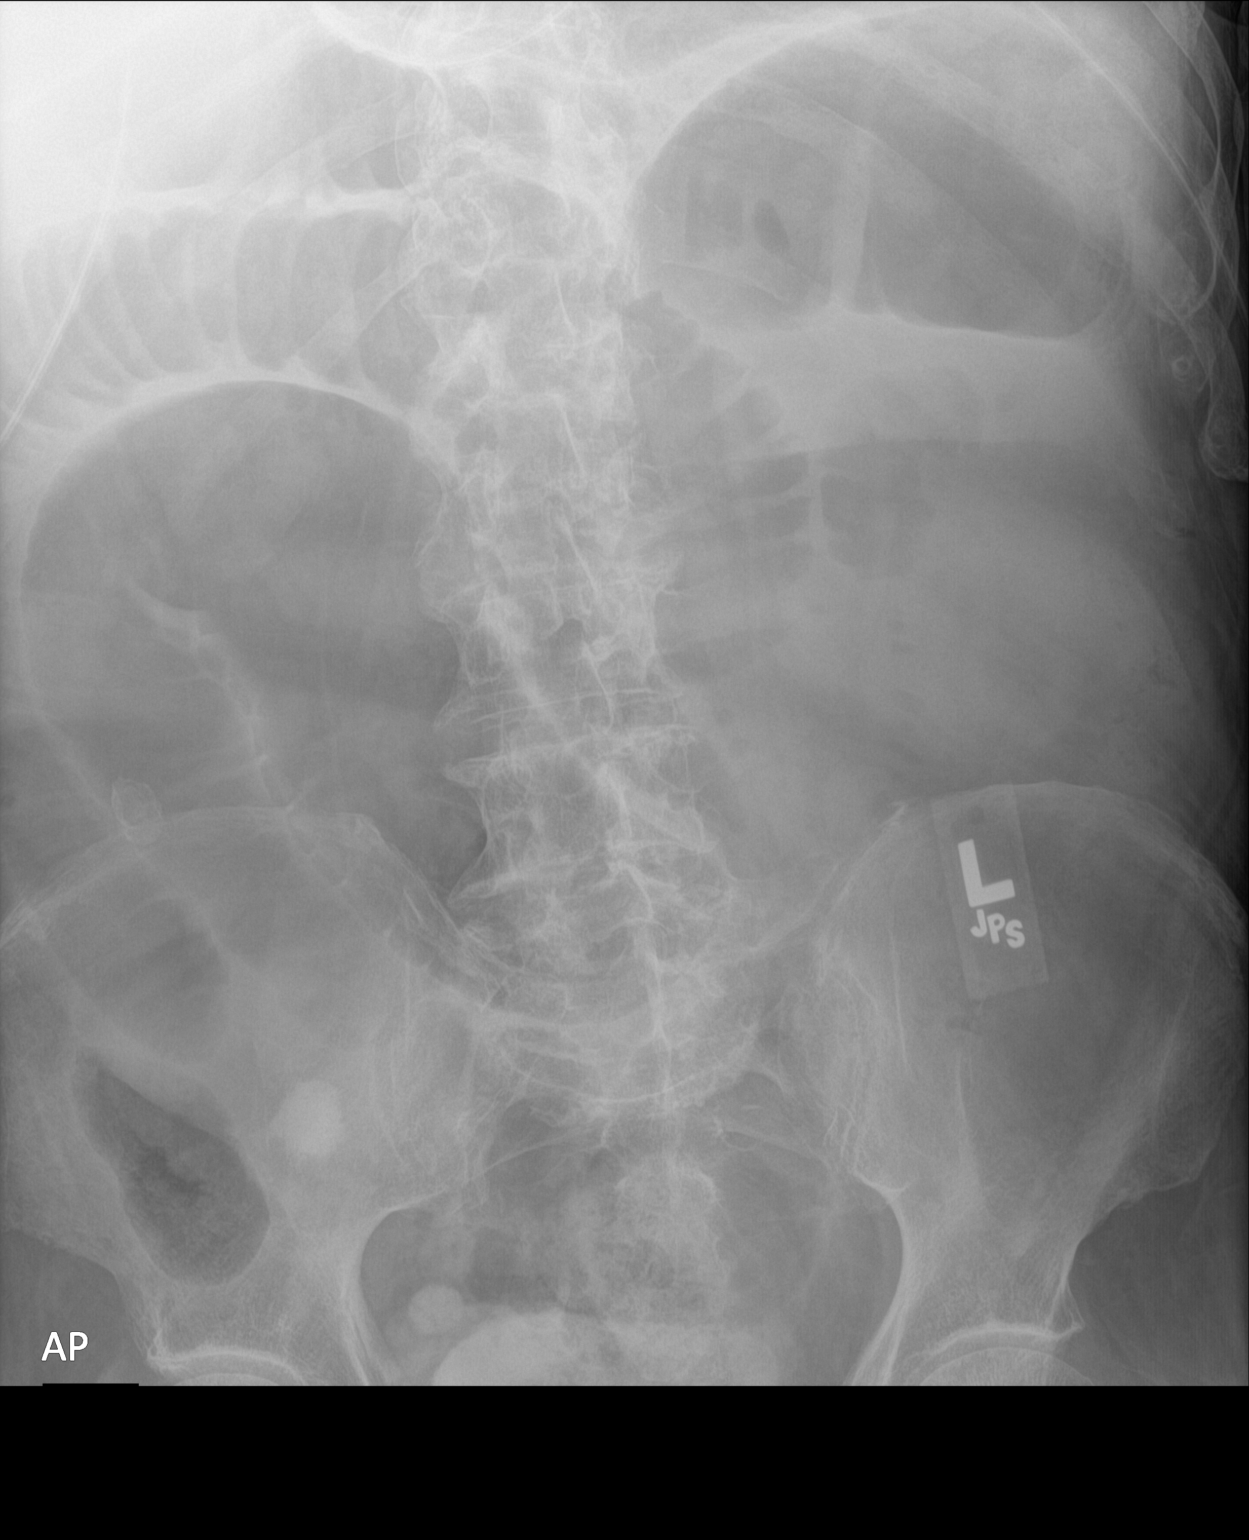

[1 of 1 positions shown; findings below may reference images not displayed]

FINDINGS: Again noted is severe gaseous distension of small bowel and
compatible with a high-grade obstruction. The degree of small bowel
distension is similar to slightly increased. Limited evaluation for
free air on this supine image. Focal sclerosis in the right ilium is
compatible with a bone island. There is contrast within the urinary
bladder.
IMPRESSION: Persistent dilated loops of small bowel compatible with a high-grade
small bowel obstruction. Degree of small bowel distension is stable
to slightly increased from the recent CT.

## 2019-08-22 IMAGING — DX PORTABLE ABDOMEN - 1 VIEW
1 series · 2 of 2 positions shown · non-contrast
Comparison: 05/06/2019, 05/05/2019

CLINICAL DATA: 8 hour delay small-bowel obstruction

EXAM:
PORTABLE ABDOMEN - 1 VIEW

[Series 1: abdomen · 0.14mm/px · 2 of 2 slices shown]
[im 1/2]
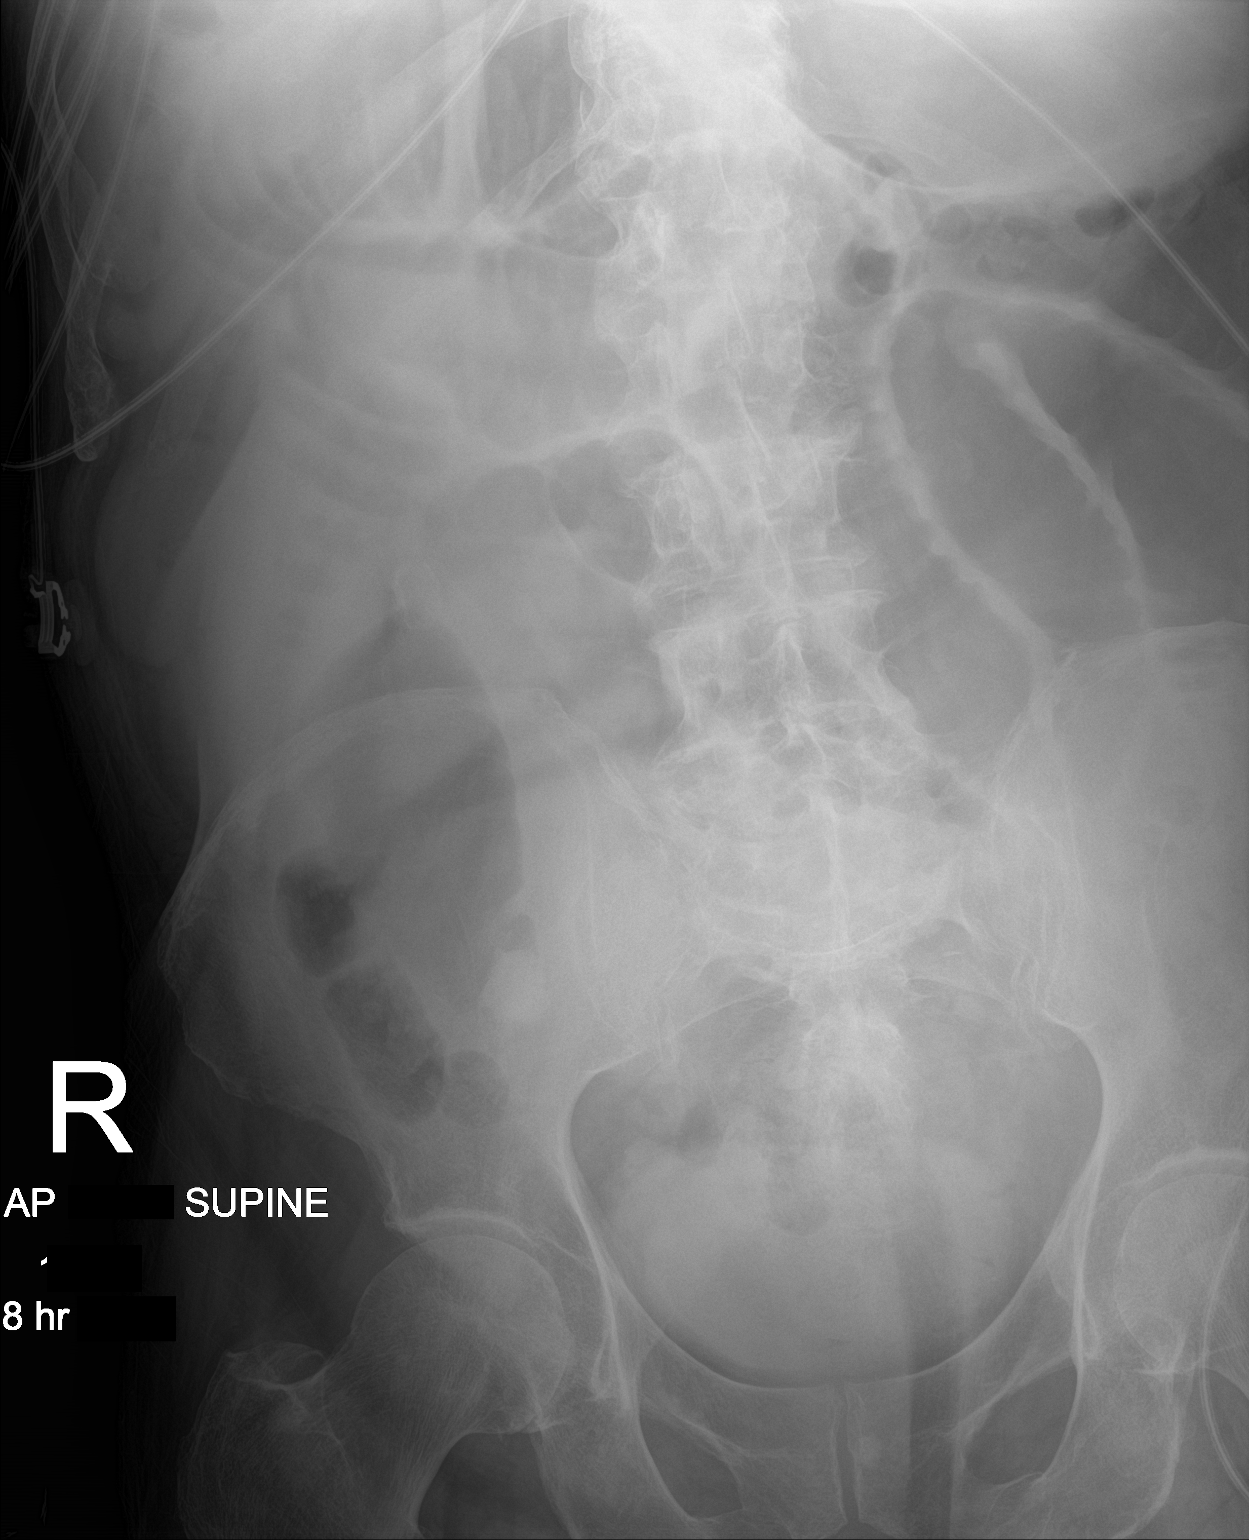
[im 2/2]
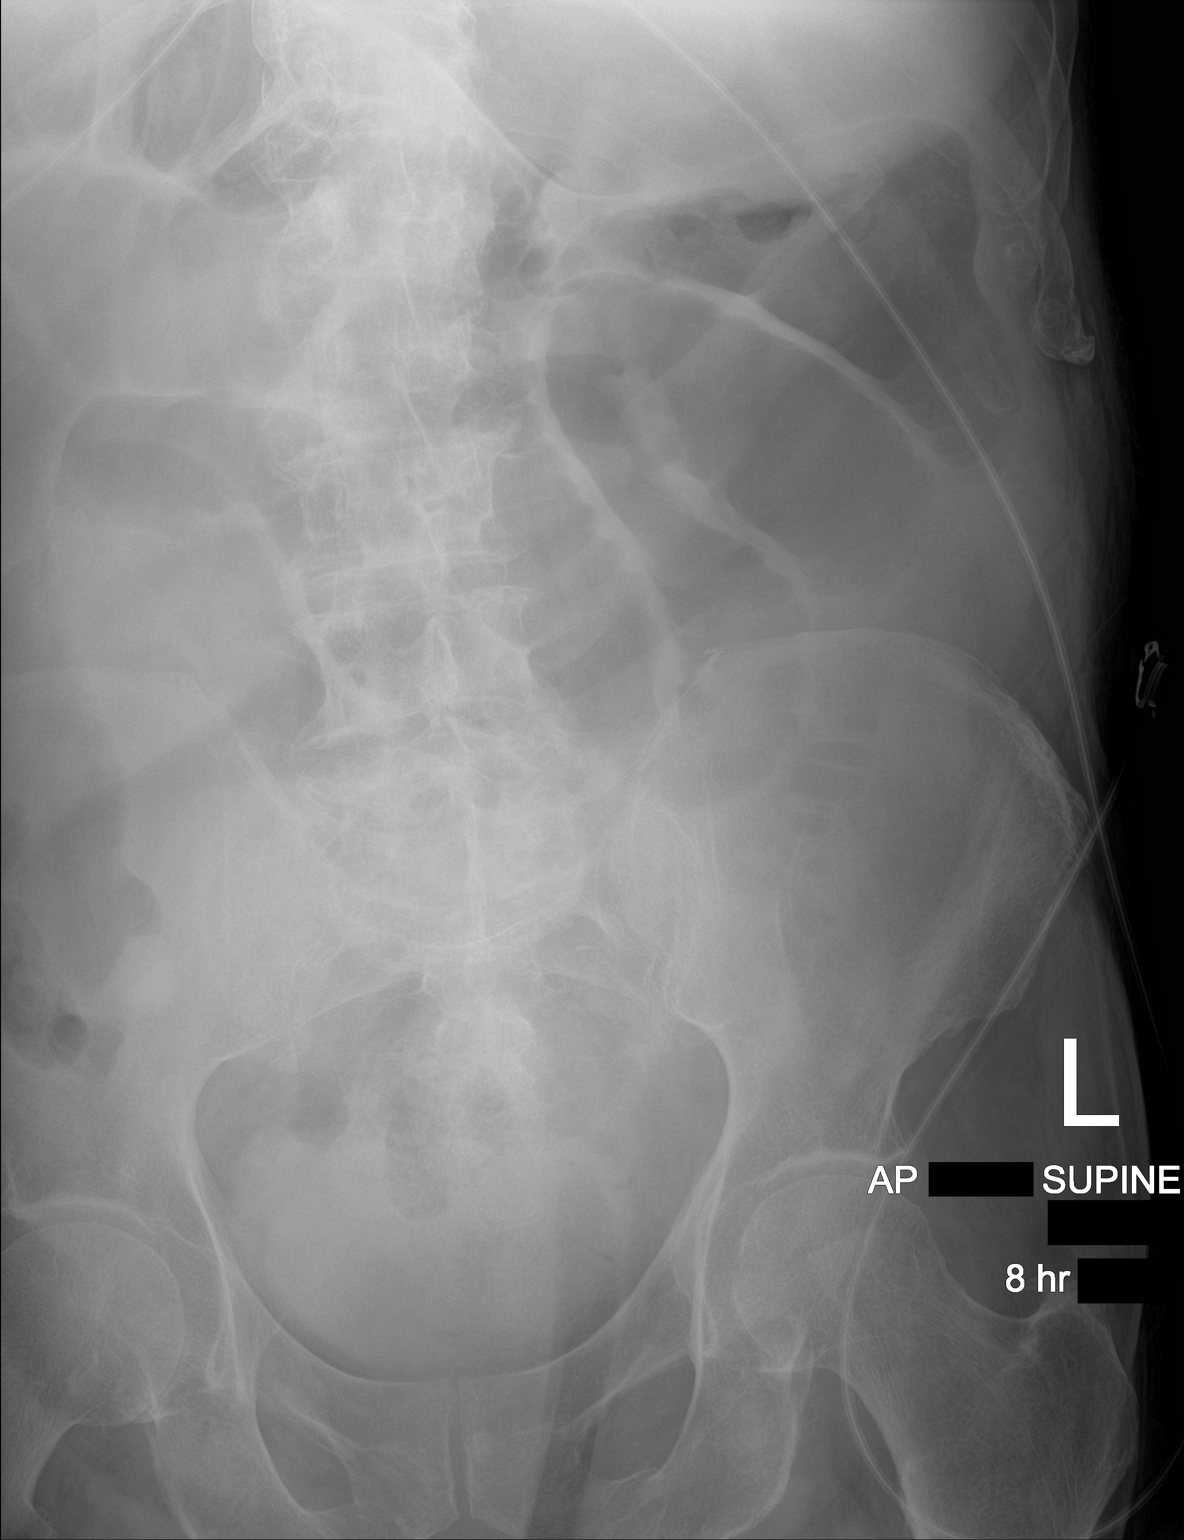

[2 of 2 positions shown; findings below may reference images not displayed]

FINDINGS: Persistent dilatation of small bowel loops consistent with bowel
obstruction. Small bowel distended up to 5.7 cm. Possible dilute
contrast within dilated small bowel in the right abdomen. No
convincing colon contrast. Residual contrast within the urinary
bladder.
IMPRESSION: 1. Persistent dilatation of small bowel, consistent with high-grade
bowel obstruction. No convincing contrast within the colon. There
may be slight dilute contrast within dilated small bowel in the
right hemiabdomen.
2. Residual contrast within the urinary bladder

## 2019-08-23 ENCOUNTER — Ambulatory Visit: Payer: Medicare Other | Admitting: Physical Therapy

## 2019-08-23 IMAGING — DX ABDOMEN - 1 VIEW
1 series · 1 of 1 positions shown · non-contrast
Comparison: 05/06/2019.  CT 05/05/2019

CLINICAL DATA: Small-bowel obstruction.

EXAM:
ABDOMEN - 1 VIEW

[abdomen]
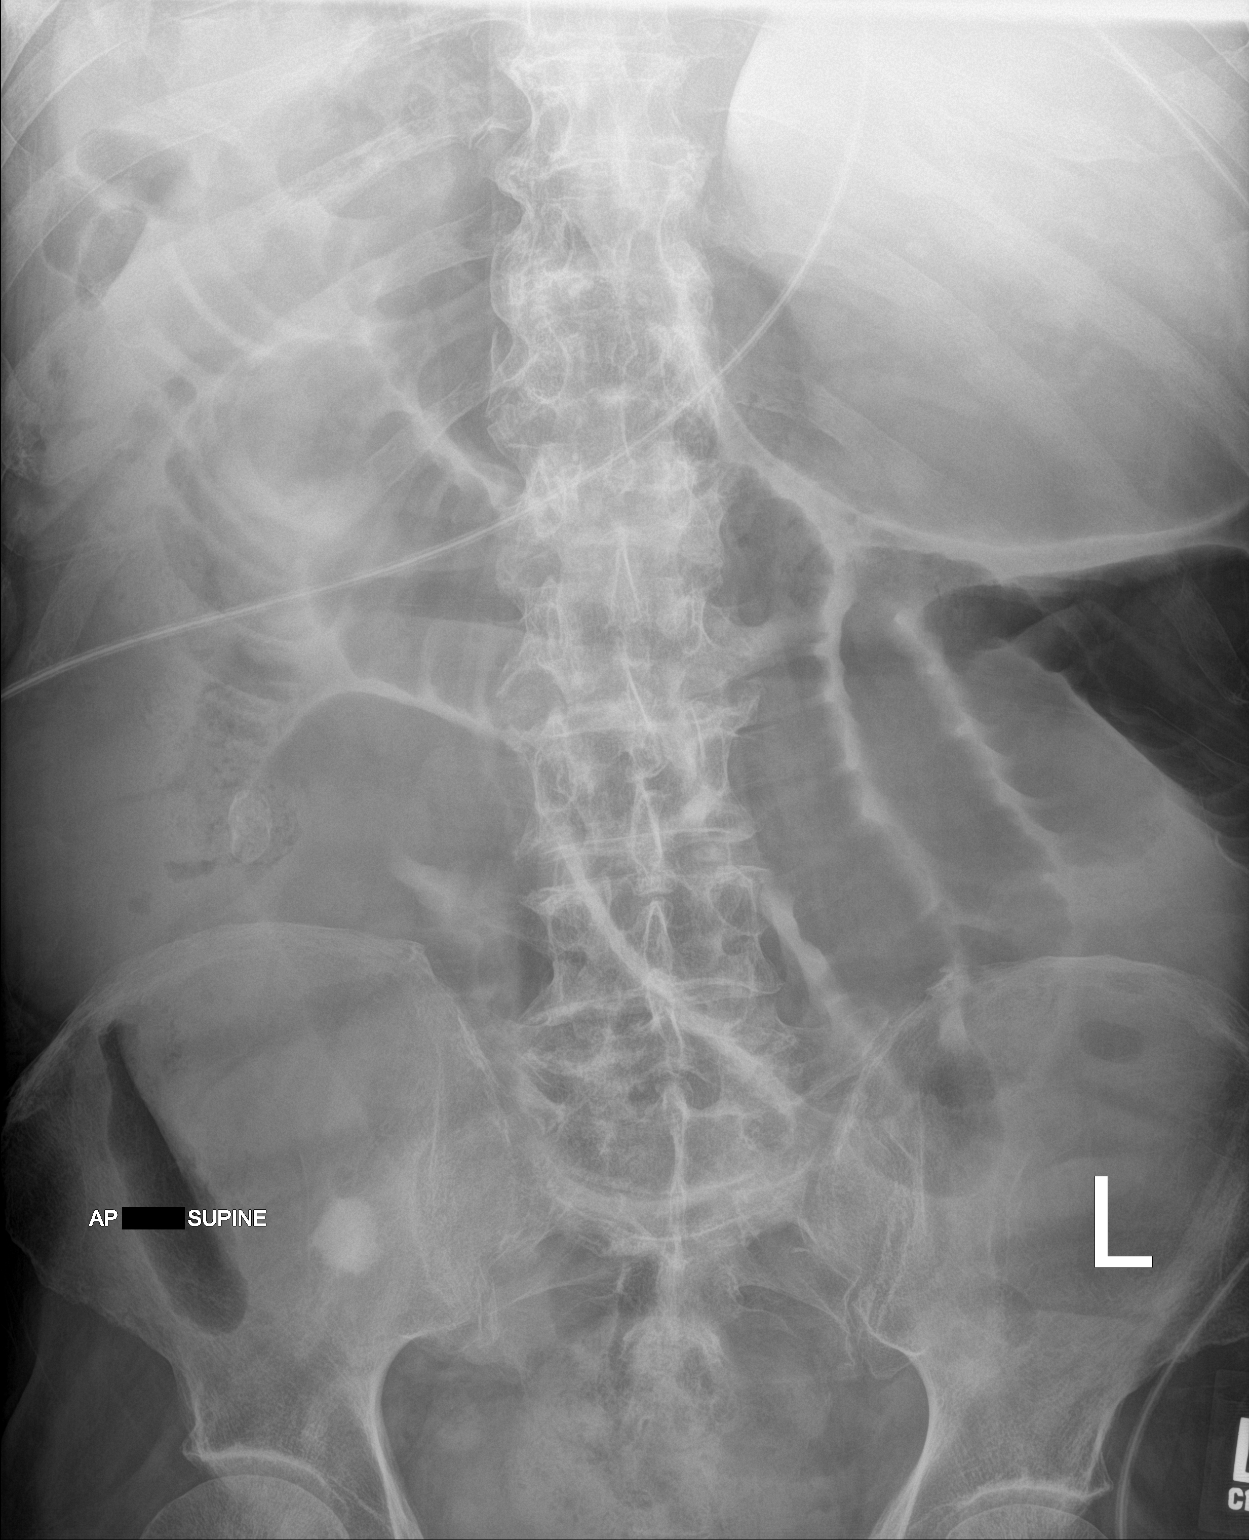

[1 of 1 positions shown; findings below may reference images not displayed]

FINDINGS: Persistent prominent distended loops of small bowel are noted.
Similar findings noted on prior exam. Gastric distention noted.
Diaphragms are incompletely imaged limiting evaluation for free air.
Stable sclerotic density noted the right ilium.
IMPRESSION: Persistent prominently distended loops of small bowel consistent
small bowel obstruction again noted. Similar findings noted on prior
exam. Gastric distention noted.

## 2019-08-25 ENCOUNTER — Telehealth: Payer: Self-pay | Admitting: Licensed Clinical Social Worker

## 2019-08-25 ENCOUNTER — Other Ambulatory Visit: Payer: Self-pay

## 2019-08-25 ENCOUNTER — Ambulatory Visit: Payer: Medicare Other | Admitting: Physical Therapy

## 2019-08-25 ENCOUNTER — Encounter: Payer: Self-pay | Admitting: Physical Therapy

## 2019-08-25 DIAGNOSIS — R2689 Other abnormalities of gait and mobility: Secondary | ICD-10-CM | POA: Diagnosis not present

## 2019-08-25 DIAGNOSIS — G8929 Other chronic pain: Secondary | ICD-10-CM

## 2019-08-25 DIAGNOSIS — M6281 Muscle weakness (generalized): Secondary | ICD-10-CM | POA: Diagnosis not present

## 2019-08-25 DIAGNOSIS — M25562 Pain in left knee: Secondary | ICD-10-CM | POA: Diagnosis not present

## 2019-08-25 NOTE — Telephone Encounter (Signed)
Palliative Care SW left a vm with patient's wife, [name], to schedule a visit.

## 2019-08-26 NOTE — Therapy (Signed)
[facility] [address] [place], [place], [address] Phone: [phone]   Fax:  [phone]  Physical Therapy Treatment  Patient Details  Name: Russell Dillon MRN: [id number] Date of Birth: [birth year] Referring Provider (PT): Marton Redwood, MD   Encounter Date: 08/25/2019  PT End of Session - 08/25/19 1451    Visit Number  13    Number of Visits  24    Date for PT Re-Evaluation  09/30/19    Authorization Type  MCR, AARP    PT Start Time  2952    PT Stop Time  1528    PT Time Calculation (min)  42 min    Equipment Utilized During Treatment  Other (comment);Gait belt    Activity Tolerance  Patient limited by fatigue;Treatment limited secondary to agitation    Behavior During Therapy  Flat affect;Agitated;WFL for tasks assessed/performed   mostly cooperative today      Past Medical History:  Diagnosis Date  . Acute metabolic encephalopathy 8/41/3244  . Aortic valve regurgitation 10/16/2018   Echo 07/28/2017:  EF 60-65, moderate AI, aortic root and ascending aorta mildly dilated (40 mm), ascending aorta 43 mm, MAC, mild MR, mild LAE, moderate RV enlargement, trivial TR // Echo 12/19: EF 60-65, normal wall motion, mild AI, moderate BAE, ascending aorta 43 mm, aortic root 39 mm  . BPH (benign prostatic hyperplasia)   . BPH (benign prostatic hypertrophy)   . Chronic anticoagulation   . Chronic atrial fibrillation ([place])   . Diastolic dysfunction October 2010   Normal LV systolic function  . Hx SBO   . Hyperlipidemia   . Hypertension   . Patellar tendon rupture, left, initial encounter 02/10/2019  . Severe sepsis ([place]) 05/05/2019  . Small bowel obstruction [facility])     Past Surgical History:  Procedure Laterality Date  . APPENDECTOMY  2004  . APPLICATION OF WOUND VAC Left 02/11/2019   Procedure: Application Of Wound Vac;  Surgeon: Altamese Garner, MD;  Location: [facility];  Service: Orthopedics;  Laterality: Left;  .  CARDIOVASCULAR STRESS TEST  09/30/2007   EF 67%  No ischemia.   Marland Kitchen CARDIOVERSION  05/01/2004  . KNEE ARTHROSCOPY Left 02/11/2019   Procedure: ARTHROSCOPY LEFT KNEE;  Surgeon: Altamese [place]port, MD;  Location: [facility];  Service: Orthopedics;  Laterality: Left;  . LAPAROTOMY N/A 05/11/2019   Procedure: EXPLORATORY LAPAROTOMY;  Surgeon: Rolm Bookbinder, MD;  Location: [facility];  Service: General;  Laterality: N/A;  . LYSIS OF ADHESION N/A 05/11/2019   Procedure: LYSIS OF ADHESION;  Surgeon: Rolm Bookbinder, MD;  Location: [facility];  Service: General;  Laterality: N/A;  . PATELLAR TENDON REPAIR Left 02/11/2019   Procedure: LEFT PATELLA TENDON REPAIR;  Surgeon: Altamese [place], MD;  Location: [facility];  Service: Orthopedics;  Laterality: Left;  . SMALL INTESTINE SURGERY  2004  . US ECHOCARDIOGRAPHY  09/05/2009   ef 55-60%    There were no vitals filed for this visit.  Subjective Assessment - 08/25/19 1448    Subjective  No new complaints from patient. Spouse reports they are doing the ex's at least 1x a day, sometimes 2x day. They are also working to move furniture around to allow pt to propell wheelchair himself. He has done this on the deck since last session.    Pertinent History  PMH: recent hospitilization 05/22/19 atrial fibrillation, dementia, essential hypertension, HLD, diastolic congestive heart failure, aortic valve regurgitation, BPH, and recent small bowel obstruction with recent surgical intervention with lysis  of adhesions    How long can you stand comfortably?  can not stand on his own    How long can you walk comfortably?  can not walk on his own    Patient Stated Goals  unable to state, his wife would like to see him walk again    Currently in Pain?  No/denies    Pain Score  0-No pain            [facility] Adult PT Treatment/Exercise - 08/25/19 1700      Transfers   Transfers  Sit to Stand;Stand to Sit;Lateral/Scoot Transfers    Sit to Stand  3: Mod assist;2: Max assist;From elevated  surface;From bed;Other (comment)   2 person assist with use of stedy lift assist   Sit to Stand Details  Tactile cues for weight shifting;Tactile cues for sequencing;Tactile cues for initiation;Manual facilitation for weight shifting;Verbal cues for safe use of DME/AE;Verbal cues for sequencing;Verbal cues for technique;Visual cues for safe use of DME/AE    Stand to Sit  3: Mod assist;With upper extremity assist;To bed;Uncontrolled descent    Stand to Sit Details (indicate cue type and reason)  Tactile cues for initiation;Tactile cues for sequencing;Tactile cues for weight shifting;Tactile cues for posture;Visual cues for safe use of DME/AE;Visual cues/gestures for precautions/safety;Verbal cues for technique;Verbal cues for precautions/safety;Manual facilitation for weight shifting;Manual facilitation for placement    Lateral/Scoot Transfers  4: Min assist;4: Min guard    Lateral/Scoot Transfer Details (indicate cue type and reason)  PTA needed to initiate scooting from wheelchair seat onto slide board, once pt was on the slide board pt required min guard assist with cues to "keep moving". from mat to wheelchair pt needed assist to place the board, then he was able to scoot himself back to the wheelchair with cues to "keep scooting" as he would stop after each scoot to say "what do you want me to do?".     Transfer Cueing  use fo Stedy Lift assist for standing from elevated mat table.  attempted with one person assist, unable to clear mat with max assist. with 2 person mod assist able to clear mat and place lift pads under pt, however did not achieve full upright standing as pt was flexed forward, almost at 90 degrees at hips. attempted to have pt straighten arms for full standing, unable to do so successfully. pt then seated on lift pads which began to aggitate him. explained he would need to stand up to allow [place] to remove them. several attempts to get pt into standing again to remove pads with mod/max  assist of 2 people with enough clearance to remove the left on on the 3rd attempt, and then the right one on the 5th attempt with a 3rd person having to move it as the others were needed to keep pt in modified standing position.       Barista Assistance  5: Market researcher cues for Advice worker  Both upper extremities    Distance  80    Comments  on a straight pathway from mat table toward lobby door at end of session. cues for use of UE's for "big' pushes and to negotiate around any objects      Knee/Hip Exercises: Aerobic   Other Aerobic  Scifit level 2.0 x10 minutes pt able to maintain a steady pace with no cues -  performed from wheelchair with incline blocks behind wheels to prevent W/C from moving.                 PT Short Term Goals - 08/25/19 1455      PT SHORT TERM GOAL #1   Title  Pt will be I and compliant with initial HEP. (target for STG 6 weeks 10/9)    Baseline  wife says he is compliant with caregiver guidance    Status  Achieved      PT SHORT TERM GOAL #2   Title  Pt will be able to perform sit to stand with mod assist.    Baseline  08/25/19: continues to need up to max assist of 2 people at times, not consistent with standing    Status  Not Met        PT Long Term Goals - 08/11/19 1700      PT LONG TERM GOAL #1   Title  Pt will be I and compliant with final HEP. (target for all LTG 12 weeks 09/30/19)    Status  On-going      PT LONG TERM GOAL #2   Title  Pt will be able to perform sit to stand mod I.    Baseline  total    Status  On-going      PT LONG TERM GOAL #3   Title  Pt will be able perform stand pivot transfers min A to decrease caregiver burden.    Baseline  total, can now perform slideboard transfer min to mod A and may neeed to adjust goal for this    Status  On-going      PT LONG TERM GOAL #4   Title  Pt will be  able to tolerate standing for 5 min with UE support.    Baseline  can not achieve upright    Status  On-going      PT LONG TERM GOAL #5   Title  Pt will improve LE strength to at least 4/5 MMT bilat    Baseline  2-3    Status  On-going            Plan - 08/25/19 1455    Clinical Impression Statement  Today's skilled session focused on progress toward STGs with HEP goal met and standing goal not met. Remainder of session continued to focus on strengthening, activity tolerance and transfer training.  Pt is inconsistent with participation from session to session, did discuss that his willingness to work with [place] is a factor in what we do each session when spouse asked if we would work on standing again today. Pt not assisting with standing attempts today.    Personal Factors and Comorbidities  Age;Comorbidity 1;Comorbidity 2;Comorbidity 3+;Past/Current Experience;Fitness    Comorbidities  PMH: recent hospitilization 05/22/19 atrial fibrillation, dementia, essential hypertension, HLD, diastolic congestive heart failure, aortic valve regurgitation, BPH, and recent small bowel obstruction with recent surgical intervention with lysis of adhesions    Examination-Activity Limitations  --   all ADL's   Examination-Participation Restrictions  --   All   Stability/Clinical Decision Making  Evolving/Moderate complexity    Rehab Potential  Fair    PT Frequency  Other (comment)   2-3   PT Duration  12 weeks    PT Treatment/Interventions  ADLs/Self Care Home Management;Aquatic Therapy;Cryotherapy;Moist Heat;DME Instruction;Gait training;Functional mobility training;Therapeutic activities;Therapeutic exercise;Balance training;Neuromuscular re-education;Manual techniques;Wheelchair mobility training;Patient/family education;Passive range of motion;Taping    PT Next Visit Plan  scift from  wheelchair to warm up; continue to focus on self wheelchair propulsion, slide board transfer to/from high/low table,  from high low table try stedy lift assist with mat table elevated with 2 person assist. allow time for pt to self initaiate once cued on step by step what is expected of him. continue to work on unsupported sitting posture and balance as well.    PT Home Exercise Plan  LAQ, sitting marches, sitting hamstring curls, SLR    Consulted and Agree with Plan of Care  Patient;Family member/caregiver    Family Member Consulted  wife       Patient will benefit from skilled therapeutic intervention in order to improve the following deficits and impairments:  Decreased balance, Abnormal gait, Decreased endurance, Decreased range of motion, Decreased strength, Decreased mobility, Difficulty walking, Postural dysfunction, Pain  Visit Diagnosis: Muscle weakness (generalized)  Other abnormalities of gait and mobility  Chronic pain of left knee     Problem List Patient Active Problem List   Diagnosis Date Noted  . Bacteremia due to Enterococcus 05/24/2019  . Urinary tract infection 05/24/2019  . Chronic diastolic CHF (congestive heart failure) ([place]) 05/22/2019  . Gout 05/22/2019  . Sepsis ([place]) 05/22/2019  . Protein-calorie malnutrition, severe 05/18/2019  . AKI (acute kidney injury) ([place]) 05/05/2019  . Transient hypotension 05/05/2019  . Sepsis, Gram negative ([place]) 03/18/2019  . Hypoalbuminemia   . Leukocytosis   . Hyperglycemia   . Persistent atrial fibrillation with RVR ([place])   . Acute on chronic anemia   . Rupture of left patellar tendon 02/15/2019  . Postoperative pain   . Bipolar 1 disorder ([place]) 02/14/2019  . Dementia without behavioral disturbance ([place]) 02/14/2019  . Patellar tendon rupture, left, initial encounter 02/10/2019  . SIRS (systemic inflammatory response syndrome) ([place]) 02/09/2019  . Osteomyelitis ([place]) 02/09/2019  . Acute blood loss anemia 02/09/2019  . Aortic valve regurgitation 10/16/2018  . SBO (small bowel obstruction) ([place]) 07/08/2013  . Hyperlipidemia 12/20/2011   . HTN (hypertension) 06/04/2011  . Atrial fibrillation, chronic ([place]) 03/01/2011  . Long term (current) use of anticoagulants 03/01/2011  . GERD 10/11/2009  . DIVERTICULOSIS OF COLON 10/11/2009    Willow Ora, PTA, [facility] Outpatient Neuro [facility] [address] [place], [place] [address] [phone] 08/26/19, 9:58 AM   Name: SHAYAAN PARKE MRN: [id number] Date of Birth: 07-25-1946

## 2019-08-27 ENCOUNTER — Ambulatory Visit: Payer: Medicare Other | Admitting: Physical Therapy

## 2019-08-27 ENCOUNTER — Other Ambulatory Visit: Payer: Self-pay

## 2019-08-27 ENCOUNTER — Encounter: Payer: Self-pay | Admitting: Physical Therapy

## 2019-08-27 DIAGNOSIS — R2689 Other abnormalities of gait and mobility: Secondary | ICD-10-CM

## 2019-08-27 DIAGNOSIS — M25562 Pain in left knee: Secondary | ICD-10-CM

## 2019-08-27 DIAGNOSIS — M6281 Muscle weakness (generalized): Secondary | ICD-10-CM

## 2019-08-27 DIAGNOSIS — G8929 Other chronic pain: Secondary | ICD-10-CM | POA: Diagnosis not present

## 2019-08-27 NOTE — Therapy (Signed)
[facility] [address] [place], [place], [address] Phone: [phone]   Fax:  [phone]  Physical Therapy Treatment  Patient Details  Name: Russell Dillon MRN: [id number] Date of Birth: 1946/10/14 Referring Provider (PT): Marton Redwood, MD   Encounter Date: 08/27/2019  PT End of Session - 08/27/19 1116    Visit Number  14    Number of Visits  24    Date for PT Re-Evaluation  09/30/19    Authorization Type  MCR, AARP    PT Start Time  1102    PT Stop Time  1145    PT Time Calculation (min)  43 min    Equipment Utilized During Treatment  Gait belt    Activity Tolerance  Treatment limited secondary to agitation;Patient tolerated treatment well    Behavior During Therapy  Flat affect;Agitated;WFL for tasks assessed/performed   mostly cooperative today      Past Medical History:  Diagnosis Date  . Acute metabolic encephalopathy 2/37/6283  . Aortic valve regurgitation 10/16/2018   Echo 07/28/2017:  EF 60-65, moderate AI, aortic root and ascending aorta mildly dilated (40 mm), ascending aorta 43 mm, MAC, mild MR, mild LAE, moderate RV enlargement, trivial TR // Echo 12/19: EF 60-65, normal wall motion, mild AI, moderate BAE, ascending aorta 43 mm, aortic root 39 mm  . BPH (benign prostatic hyperplasia)   . BPH (benign prostatic hypertrophy)   . Chronic anticoagulation   . Chronic atrial fibrillation ([place])   . Diastolic dysfunction October 2010   Normal LV systolic function  . Hx SBO   . Hyperlipidemia   . Hypertension   . Patellar tendon rupture, left, initial encounter 02/10/2019  . Severe sepsis ([place]) 05/05/2019  . Small bowel obstruction [facility])     Past Surgical History:  Procedure Laterality Date  . APPENDECTOMY  2004  . APPLICATION OF WOUND VAC Left 02/11/2019   Procedure: Application Of Wound Vac;  Surgeon: Altamese [place], MD;  Location: [facility];  Service: Orthopedics;  Laterality: Left;  . CARDIOVASCULAR  STRESS TEST  09/30/2007   EF 67%  No ischemia.   Marland Kitchen CARDIOVERSION  05/01/2004  . KNEE ARTHROSCOPY Left 02/11/2019   Procedure: ARTHROSCOPY LEFT KNEE;  Surgeon: Altamese [place], MD;  Location: [place] [facility];  Service: Orthopedics;  Laterality: Left;  . LAPAROTOMY N/A 05/11/2019   Procedure: EXPLORATORY LAPAROTOMY;  Surgeon: Rolm Bookbinder, MD;  Location: [facility];  Service: General;  Laterality: N/A;  . LYSIS OF ADHESION N/A 05/11/2019   Procedure: LYSIS OF ADHESION;  Surgeon: Rolm Bookbinder, MD;  Location: [facility];  Service: General;  Laterality: N/A;  . PATELLAR TENDON REPAIR Left 02/11/2019   Procedure: LEFT PATELLA TENDON REPAIR;  Surgeon: Altamese [place], MD;  Location: [facility];  Service: Orthopedics;  Laterality: Left;  . SMALL INTESTINE SURGERY  2004  . US ECHOCARDIOGRAPHY  09/05/2009   ef 55-60%    There were no vitals filed for this visit.  Subjective Assessment - 08/27/19 1114    Subjective  "I'm doing great, better than I was last time". Wife states that he has been propelling himself at home with wheelchair and they are working on moving furniture around to make it easier at home.    Pertinent History  PMH: recent hospitilization 05/22/19 atrial fibrillation, dementia, essential hypertension, HLD, diastolic congestive heart failure, aortic valve regurgitation, BPH, and recent small bowel obstruction with recent surgical intervention with lysis of adhesions    How long can you stand comfortably?  can not stand on his own    How long can you walk comfortably?  can not walk on his own    Patient Stated Goals  unable to state, his wife would like to see him walk again    Currently in Pain?  No/denies                       [facility] Adult PT Treatment/Exercise - 08/27/19 1119      Transfers   Comments  Patient adamant that he did not want to get on the wooden board today for slideboard transfer. Repeated multiple times throughout session "I do not want to get on the Education administrator  Yes    Wheelchair Assistance  5: Supervision    Wheelchair Assistance Details  Verbal cues for precautions/safety;Visual cues/gestures for Astronomer  Both upper extremities;Both lower extermities    Distance  115   plus 2 x 40   Comments  Pt able to perform w/c mobility today with BUE and BLE, with minimal initial cueing for hand placement and use of LEs for heel digs. Pt performs 115' without stopping - able to navigate turns today.      Exercises   Other Exercises   Seated in w/c:  with red theraband: 2 x 10 reps hamstring curls, 2 x 10 reps hip ABD, 1 x 10 reps sit ups from wheelchair with visual and verbal cues for forward lean for core activation. With 2lb ankle weight: 2 x 10 reps LAQs, 2 x 10 reps marching       Knee/Hip Exercises: Aerobic   Other Aerobic  Scifit level 2.0 x10 minutes pt able to maintain a steady pace with no cues - performed from wheelchair with incline blocks behind wheels to prevent W/C from moving.                 PT Short Term Goals - 08/25/19 1455      PT SHORT TERM GOAL #1   Title  Pt will be I and compliant with initial HEP. (target for STG 6 weeks 10/9)    Baseline  wife says he is compliant with caregiver guidance    Status  Achieved      PT SHORT TERM GOAL #2   Title  Pt will be able to perform sit to stand with mod assist.    Baseline  08/25/19: continues to need up to max assist of 2 people at times, not consistent with standing    Status  Not Met        PT Long Term Goals - 08/11/19 1700      PT LONG TERM GOAL #1   Title  Pt will be I and compliant with final HEP. (target for all LTG 12 weeks 09/30/19)    Status  On-going      PT LONG TERM GOAL #2   Title  Pt will be able to perform sit to stand mod I.    Baseline  total    Status  On-going      PT LONG TERM GOAL #3   Title  Pt will be able perform stand pivot transfers min A to decrease caregiver burden.     Baseline  total, can now perform slideboard transfer min to mod A and may neeed to adjust goal for this    Status  On-going  PT LONG TERM GOAL #4   Title  Pt will be able to tolerate standing for 5 min with UE support.    Baseline  can not achieve upright    Status  On-going      PT LONG TERM GOAL #5   Title  Pt will improve LE strength to at least 4/5 MMT bilat    Baseline  2-3    Status  On-going            Plan - 08/27/19 1205    Clinical Impression Statement  Today's session focused on LE strengthening, activity tolerance, endurance, and self-propulsion w/c mobility. Pt adamant today that he did not want to get on the slide board and get to the mat - repeated "I do not want to do the wooden board" multiple times throughout session today, therefore did not transfer to mat table. Pt is inconsistent with participation - however was very willing today to focus on seated LE strenghtening with ankle weights and theraband. Pt with improved initiation today for w/c mobility - able to self propel around gym using BUE and BLE with minimal cueing.    Personal Factors and Comorbidities  Age;Comorbidity 1;Comorbidity 2;Comorbidity 3+;Past/Current Experience;Fitness    Comorbidities  PMH: recent hospitilization 05/22/19 atrial fibrillation, dementia, essential hypertension, HLD, diastolic congestive heart failure, aortic valve regurgitation, BPH, and recent small bowel obstruction with recent surgical intervention with lysis of adhesions    Examination-Activity Limitations  --   all ADL's   Examination-Participation Restrictions  --   All   Stability/Clinical Decision Making  Evolving/Moderate complexity    Rehab Potential  Fair    PT Frequency  Other (comment)   2-3   PT Duration  12 weeks    PT Treatment/Interventions  ADLs/Self Care Home Management;Aquatic Therapy;Cryotherapy;Moist Heat;DME Instruction;Gait training;Functional mobility training;Therapeutic activities;Therapeutic  exercise;Balance training;Neuromuscular re-education;Manual techniques;Wheelchair mobility training;Patient/family education;Passive range of motion;Taping    PT Next Visit Plan  scift from wheelchair to warm up; continue to focus on self wheelchair propulsion, slide board transfer to/from high/low table, from high low table try stedy lift assist with mat table elevated with 2 person assist. allow time for pt to self initaiate once cued on step by step what is expected of him. continue to work on unsupported sitting posture and balance as well.    PT Home Exercise Plan  LAQ, sitting marches, sitting hamstring curls, SLR    Consulted and Agree with Plan of Care  Patient;Family member/caregiver    Family Member Consulted  wife       Patient will benefit from skilled therapeutic intervention in order to improve the following deficits and impairments:  Decreased balance, Abnormal gait, Decreased endurance, Decreased range of motion, Decreased strength, Decreased mobility, Difficulty walking, Postural dysfunction, Pain  Visit Diagnosis: Muscle weakness (generalized)  Other abnormalities of gait and mobility  Chronic pain of left knee     Problem List Patient Active Problem List   Diagnosis Date Noted  . Bacteremia due to Enterococcus 05/24/2019  . Urinary tract infection 05/24/2019  . Chronic diastolic CHF (congestive heart failure) ([place]) 05/22/2019  . Gout 05/22/2019  . Sepsis ([place]) 05/22/2019  . Protein-calorie malnutrition, severe 05/18/2019  . AKI (acute kidney injury) ([place]) 05/05/2019  . Transient hypotension 05/05/2019  . Sepsis, Gram negative ([place]) 03/18/2019  . Hypoalbuminemia   . Leukocytosis   . Hyperglycemia   . Persistent atrial fibrillation with RVR ([place])   . Acute on chronic anemia   . Rupture of left  patellar tendon 02/15/2019  . Postoperative pain   . Bipolar 1 disorder ([place]) 02/14/2019  . Dementia without behavioral disturbance ([place]) 02/14/2019  . Patellar tendon  rupture, left, initial encounter 02/10/2019  . SIRS (systemic inflammatory response syndrome) ([place]) 02/09/2019  . Osteomyelitis ([place]) 02/09/2019  . Acute blood loss anemia 02/09/2019  . Aortic valve regurgitation 10/16/2018  . SBO (small bowel obstruction) ([place]) 07/08/2013  . Hyperlipidemia 12/20/2011  . HTN (hypertension) 06/04/2011  . Atrial fibrillation, chronic ([place]) 03/01/2011  . Long term (current) use of anticoagulants 03/01/2011  . GERD 10/11/2009  . DIVERTICULOSIS OF COLON 10/11/2009    Arliss Journey, PT, DPT  08/27/2019, 12:11 PM  [facility] [address] [place], [place], [address] Phone: [phone]   Fax:  [phone]  Name: Russell Dillon MRN: [id number] Date of Birth: 01/07/1946

## 2019-08-30 ENCOUNTER — Ambulatory Visit: Payer: Medicare Other | Admitting: Physical Therapy

## 2019-08-30 ENCOUNTER — Other Ambulatory Visit: Payer: Self-pay

## 2019-08-30 DIAGNOSIS — M25562 Pain in left knee: Secondary | ICD-10-CM | POA: Diagnosis not present

## 2019-08-30 DIAGNOSIS — M6281 Muscle weakness (generalized): Secondary | ICD-10-CM

## 2019-08-30 DIAGNOSIS — R2689 Other abnormalities of gait and mobility: Secondary | ICD-10-CM

## 2019-08-30 DIAGNOSIS — G8929 Other chronic pain: Secondary | ICD-10-CM

## 2019-08-30 NOTE — Therapy (Signed)
[facility] [address] [place], [place], [address] Phone: [phone]   Fax:  [phone]  Physical Therapy Treatment  Patient Details  Name: Russell Dillon MRN: [id number] Date of Birth: August 16, 1946 Referring Provider (PT): Marton Redwood, MD   Encounter Date: 08/30/2019  PT End of Session - 08/30/19 1634    Visit Number  15    Number of Visits  24    Date for PT Re-Evaluation  09/30/19    Authorization Type  MCR, AARP    PT Start Time  1537    PT Stop Time  1616    PT Time Calculation (min)  39 min    Equipment Utilized During Treatment  Gait belt    Activity Tolerance  Patient tolerated treatment well    Behavior During Therapy  Flat affect;WFL for tasks assessed/performed   mostly cooperative today      Past Medical History:  Diagnosis Date  . Acute metabolic encephalopathy 04/17/3709  . Aortic valve regurgitation 10/16/2018   Echo 07/28/2017:  EF 60-65, moderate AI, aortic root and ascending aorta mildly dilated (40 mm), ascending aorta 43 mm, MAC, mild MR, mild LAE, moderate RV enlargement, trivial TR // Echo 12/19: EF 60-65, normal wall motion, mild AI, moderate BAE, ascending aorta 43 mm, aortic root 39 mm  . BPH (benign prostatic hyperplasia)   . BPH (benign prostatic hypertrophy)   . Chronic anticoagulation   . Chronic atrial fibrillation ([place])   . Diastolic dysfunction October 2010   Normal LV systolic function  . Hx SBO   . Hyperlipidemia   . Hypertension   . Patellar tendon rupture, left, initial encounter 02/10/2019  . Severe sepsis ([place]) 05/05/2019  . Small bowel obstruction [facility])     Past Surgical History:  Procedure Laterality Date  . APPENDECTOMY  2004  . APPLICATION OF WOUND VAC Left 02/11/2019   Procedure: Application Of Wound Vac;  Surgeon: Altamese Urania, MD;  Location: [facility];  Service: Orthopedics;  Laterality: Left;  . CARDIOVASCULAR STRESS TEST  09/30/2007   EF 67%  No ischemia.    Marland Kitchen CARDIOVERSION  05/01/2004  . KNEE ARTHROSCOPY Left 02/11/2019   Procedure: ARTHROSCOPY LEFT KNEE;  Surgeon: Altamese Madera, MD;  Location: [facility];  Service: Orthopedics;  Laterality: Left;  . LAPAROTOMY N/A 05/11/2019   Procedure: EXPLORATORY LAPAROTOMY;  Surgeon: Rolm Bookbinder, MD;  Location: [facility];  Service: General;  Laterality: N/A;  . LYSIS OF ADHESION N/A 05/11/2019   Procedure: LYSIS OF ADHESION;  Surgeon: Rolm Bookbinder, MD;  Location: [facility];  Service: General;  Laterality: N/A;  . PATELLAR TENDON REPAIR Left 02/11/2019   Procedure: LEFT PATELLA TENDON REPAIR;  Surgeon: Altamese [place], MD;  Location: [facility];  Service: Orthopedics;  Laterality: Left;  . SMALL INTESTINE SURGERY  2004  . US ECHOCARDIOGRAPHY  09/05/2009   ef 55-60%    There were no vitals filed for this visit.  Subjective Assessment - 08/30/19 1546    Subjective  His wife states that he had an episode of chills yesterday - however he had no fever. Wife states that she is able to help out with sliding board transfers at home. Has noticed he has been more tired.    Pertinent History  PMH: recent hospitilization 05/22/19 atrial fibrillation, dementia, essential hypertension, HLD, diastolic congestive heart failure, aortic valve regurgitation, BPH, and recent small bowel obstruction with recent surgical intervention with lysis of adhesions    How long can you stand comfortably?  can not stand on his own    How long can you walk comfortably?  can not walk on his own    Patient Stated Goals  unable to state, his wife would like to see him walk again    Currently in Pain?  No/denies                       [facility] Adult PT Treatment/Exercise - 08/30/19 1549      Transfers   Transfers  Sit to Stand;Stand to Sit;Lateral/Scoot Transfers    Sit to Stand  2: Max assist;1: +2 Total assist;From elevated surface    Sit to Stand Details  Tactile cues for weight shifting;Tactile cues for sequencing;Tactile cues for  initiation;Manual facilitation for weight shifting;Verbal cues for safe use of DME/AE;Verbal cues for sequencing;Verbal cues for technique;Visual cues for safe use of DME/AE    Sit to Stand Details (indicate cue type and reason)  Use of Stedy Lift to assist with sit <> stand transfer from elevated blue mat table. Cueing for UE placement on bars and pulling to 2 stand with max A of 2. Pt able to clear buttocks off mat table, however still in a heavy forward flexed posture at approx. 90 degrees of hip flexion with knee flexion. Verbal cues to stand tall and push through arms - pt able to tolerate standing 5-10 seconds before needing so sit back down on mat, lacking eccentric control when lowering. Attempted standing with stedy 2 times before pt reporting "I've had enough".    Lateral/Scoot Transfers  4: Min assist;3: Mod Passenger transport manager Details (indicate cue type and reason)  PT needing multi-modal cues to get pt to laterally weight shift towards R in order for therapist to place sliding board under pt for transfer. Cues for pt to lean forward towards therapist to promote anterior weight shifting and to keep scooting. Needed mod A during final 2 scoots to mat table. When transferring back to w/c from mat, pt stopping halfway on the sliding board stating "I'm done" - needed cueing for pt to keep scooting to get into w/c.       Barista Assistance  5: Market researcher cues for precautions/safety;Visual cues/gestures for Astronomer  Both upper extremities;Both lower extermities    Distance  115    Comments  initial cueing for hand placement.      Knee/Hip Exercises: Aerobic   Other Aerobic  Scifit level 2.0 x10 minutes pt able to maintain a steady pace with no cues - performed from wheelchair with incline blocks behind wheels to prevent W/C from moving.                  PT Short Term Goals - 08/25/19 1455      PT SHORT TERM GOAL #1   Title  Pt will be I and compliant with initial HEP. (target for STG 6 weeks 10/9)    Baseline  wife says he is compliant with caregiver guidance    Status  Achieved      PT SHORT TERM GOAL #2   Title  Pt will be able to perform sit to stand with mod assist.    Baseline  08/25/19: continues to need up to max assist of 2 people at times, not consistent with standing    Status  Not Met  PT Long Term Goals - 08/11/19 1700      PT LONG TERM GOAL #1   Title  Pt will be I and compliant with final HEP. (target for all LTG 12 weeks 09/30/19)    Status  On-going      PT LONG TERM GOAL #2   Title  Pt will be able to perform sit to stand mod I.    Baseline  total    Status  On-going      PT LONG TERM GOAL #3   Title  Pt will be able perform stand pivot transfers min A to decrease caregiver burden.    Baseline  total, can now perform slideboard transfer min to mod A and may neeed to adjust goal for this    Status  On-going      PT LONG TERM GOAL #4   Title  Pt will be able to tolerate standing for 5 min with UE support.    Baseline  can not achieve upright    Status  On-going      PT LONG TERM GOAL #5   Title  Pt will improve LE strength to at least 4/5 MMT bilat    Baseline  2-3    Status  On-going            Plan - 08/30/19 2111    Clinical Impression Statement  Pt with improved participation throughout today's session today. Pt willing to participate in sliding board transfer today and willing to attempt standing in Stedy 2 times with max A of 2. Pt able to clear buttocks from mat, however even with verbal cueing unable to stand tall - was only able to tolerate standing for approx. 5-10 seconds before needing to sit back down. Pt is progressing slowly - will continue to progress towards LTGs.    Personal Factors and Comorbidities  Age;Comorbidity 1;Comorbidity 2;Comorbidity  3+;Past/Current Experience;Fitness    Comorbidities  PMH: recent hospitilization 05/22/19 atrial fibrillation, dementia, essential hypertension, HLD, diastolic congestive heart failure, aortic valve regurgitation, BPH, and recent small bowel obstruction with recent surgical intervention with lysis of adhesions    Examination-Activity Limitations  --   all ADL's   Examination-Participation Restrictions  --   All   Stability/Clinical Decision Making  Evolving/Moderate complexity    Rehab Potential  Fair    PT Frequency  Other (comment)   2-3   PT Duration  12 weeks    PT Treatment/Interventions  ADLs/Self Care Home Management;Aquatic Therapy;Cryotherapy;Moist Heat;DME Instruction;Gait training;Functional mobility training;Therapeutic activities;Therapeutic exercise;Balance training;Neuromuscular re-education;Manual techniques;Wheelchair mobility training;Patient/family education;Passive range of motion;Taping    PT Next Visit Plan  scift from wheelchair to warm up; continue to focus on self wheelchair propulsion, slide board transfer to/from high/low table, from high low table try stedy lift assist with mat table elevated with 2 person assist. allow time for pt to self initaiate once cued on step by step what is expected of him. continue to work on unsupported sitting posture and balance as well.    PT Home Exercise Plan  LAQ, sitting marches, sitting hamstring curls, SLR    Consulted and Agree with Plan of Care  Patient;Family member/caregiver    Family Member Consulted  wife       Patient will benefit from skilled therapeutic intervention in order to improve the following deficits and impairments:  Decreased balance, Abnormal gait, Decreased endurance, Decreased range of motion, Decreased strength, Decreased mobility, Difficulty walking, Postural dysfunction, Pain  Visit Diagnosis: Muscle weakness (generalized)  Other abnormalities of gait and mobility  Chronic pain of left  knee     Problem List Patient Active Problem List   Diagnosis Date Noted  . Bacteremia due to Enterococcus 05/24/2019  . Urinary tract infection 05/24/2019  . Chronic diastolic CHF (congestive heart failure) ([place]) 05/22/2019  . Gout 05/22/2019  . Sepsis ([place]) 05/22/2019  . Protein-calorie malnutrition, severe 05/18/2019  . AKI (acute kidney injury) ([place]) 05/05/2019  . Transient hypotension 05/05/2019  . Sepsis, Gram negative ([place]) 03/18/2019  . Hypoalbuminemia   . Leukocytosis   . Hyperglycemia   . Persistent atrial fibrillation with RVR ([place])   . Acute on chronic anemia   . Rupture of left patellar tendon 02/15/2019  . Postoperative pain   . Bipolar 1 disorder ([place]) 02/14/2019  . Dementia without behavioral disturbance ([place]) 02/14/2019  . Patellar tendon rupture, left, initial encounter 02/10/2019  . SIRS (systemic inflammatory response syndrome) ([place]) 02/09/2019  . Osteomyelitis ([place]) 02/09/2019  . Acute blood loss anemia 02/09/2019  . Aortic valve regurgitation 10/16/2018  . SBO (small bowel obstruction) ([place]) 07/08/2013  . Hyperlipidemia 12/20/2011  . HTN (hypertension) 06/04/2011  . Atrial fibrillation, chronic ([place]) 03/01/2011  . Long term (current) use of anticoagulants 03/01/2011  . GERD 10/11/2009  . DIVERTICULOSIS OF COLON 10/11/2009    Arliss Journey, PT, DPT  08/30/2019, 9:30 PM  [facility] [address] [place], [place], [address] Phone: [phone]   Fax:  [phone]  Name: Russell Dillon MRN: [id number] Date of Birth: 01-10-1946

## 2019-08-31 ENCOUNTER — Other Ambulatory Visit: Payer: Medicare Other | Admitting: Licensed Clinical Social Worker

## 2019-08-31 DIAGNOSIS — Z515 Encounter for palliative care: Secondary | ICD-10-CM

## 2019-08-31 NOTE — Progress Notes (Signed)
COMMUNITY PALLIATIVE CARE SW NOTE  PATIENT NAME: Russell Dillon DOB: December 28, 1945 MRN: [id number]  PRIMARY CARE PROVIDER: Marton Redwood, MD  RESPONSIBLE PARTY:  Acct ID - Guarantor Home Phone Work Phone Relationship Acct Type  [id number] Russell Dillon,* [phone]  Self P/F     [place], [place], [place] [address]   Due to the COVID-19 crisis, this virtual check-in visit was done via telephone from my office and it was initiated and consent given by thispatientand or family.   PLAN OF CARE and INTERVENTIONS:             1. GOALS OF CARE/ ADVANCE CARE PLANNING:  Goal is for patient to have the best possible life he can.  Patient is a full code. 2. SOCIAL/EMOTIONAL/SPIRITUAL ASSESSMENT/ INTERVENTIONS:  SW conducted a Sales executive visit with patient's wife, Russell Dillon.  She stated she was "swamped".  Patient continues receiving physical therapy.  He requires lab work, which [place] [facility] referred to his PCP.  Provided education regarding Hospice services per her request.  SW provided active listening and supportive counseling. 3. PATIENT/CAREGIVER EDUCATION/ COPING:  She copes by problem-solving. 4. PERSONAL EMERGENCY PLAN:  She will contact EMS. 5. COMMUNITY RESOURCES COORDINATION/ HEALTH CARE NAVIGATION:  Patient has private caregivers. 6. FINANCIAL/LEGAL CONCERNS/INTERVENTIONS:  None.     SOCIAL HX:  Social History   Tobacco Use  . Smoking status: Never Smoker  . Smokeless tobacco: Never Used  Substance Use Topics  . Alcohol use: No    Alcohol/week: 0.0 standard drinks    CODE STATUS:  Full Code ADVANCED DIRECTIVES: No MOST FORM COMPLETE:  No HOSPICE EDUCATION PROVIDED:  Yes PPS:  Russell Dillon states his appetite is normal.  He transfers with assistance. Duration of visit and documentation:  30 minutes.      Russell Corn [name], LCSW

## 2019-09-01 ENCOUNTER — Ambulatory Visit: Payer: Medicare Other | Admitting: Physical Therapy

## 2019-09-01 ENCOUNTER — Encounter: Payer: Self-pay | Admitting: Physical Therapy

## 2019-09-01 ENCOUNTER — Other Ambulatory Visit: Payer: Self-pay

## 2019-09-01 DIAGNOSIS — M6281 Muscle weakness (generalized): Secondary | ICD-10-CM

## 2019-09-01 DIAGNOSIS — M25562 Pain in left knee: Secondary | ICD-10-CM | POA: Diagnosis not present

## 2019-09-01 DIAGNOSIS — G8929 Other chronic pain: Secondary | ICD-10-CM

## 2019-09-01 DIAGNOSIS — R2689 Other abnormalities of gait and mobility: Secondary | ICD-10-CM | POA: Diagnosis not present

## 2019-09-01 NOTE — Therapy (Signed)
[facility] [address] [place], [place], [address] Phone: [phone]   Fax:  [phone]  Physical Therapy Treatment  Patient Details  Name: Russell Dillon MRN: [id number] Date of Birth: [birth year] Referring Provider (PT): Marton Redwood, MD   Encounter Date: 09/01/2019  PT End of Session - 09/02/19 1350    Visit Number  16    Number of Visits  24    Date for PT Re-Evaluation  09/30/19    Authorization Type  MCR, AARP    PT Start Time  4287    PT Stop Time  1525    PT Time Calculation (min)  38 min    Equipment Utilized During Treatment  Gait belt    Activity Tolerance  Patient tolerated treatment well    Behavior During Therapy  Flat affect;WFL for tasks assessed/performed   mostly cooperative today      Past Medical History:  Diagnosis Date  . Acute metabolic encephalopathy 6/81/1572  . Aortic valve regurgitation 10/16/2018   Echo 07/28/2017:  EF 60-65, moderate AI, aortic root and ascending aorta mildly dilated (40 mm), ascending aorta 43 mm, MAC, mild MR, mild LAE, moderate RV enlargement, trivial TR // Echo 12/19: EF 60-65, normal wall motion, mild AI, moderate BAE, ascending aorta 43 mm, aortic root 39 mm  . BPH (benign prostatic hyperplasia)   . BPH (benign prostatic hypertrophy)   . Chronic anticoagulation   . Chronic atrial fibrillation ([place])   . Diastolic dysfunction October 2010   Normal LV systolic function  . Hx SBO   . Hyperlipidemia   . Hypertension   . Patellar tendon rupture, left, initial encounter 02/10/2019  . Severe sepsis ([place]) 05/05/2019  . Small bowel obstruction [facility])     Past Surgical History:  Procedure Laterality Date  . APPENDECTOMY  2004  . APPLICATION OF WOUND VAC Left 02/11/2019   Procedure: Application Of Wound Vac;  Surgeon: Altamese Antonito, MD;  Location: [facility];  Service: Orthopedics;  Laterality: Left;  . CARDIOVASCULAR STRESS TEST  09/30/2007   EF 67%  No ischemia.    Marland Kitchen CARDIOVERSION  05/01/2004  . KNEE ARTHROSCOPY Left 02/11/2019   Procedure: ARTHROSCOPY LEFT KNEE;  Surgeon: Altamese [place], MD;  Location: [facility];  Service: Orthopedics;  Laterality: Left;  . LAPAROTOMY N/A 05/11/2019   Procedure: EXPLORATORY LAPAROTOMY;  Surgeon: Rolm Bookbinder, MD;  Location: [facility];  Service: General;  Laterality: N/A;  . LYSIS OF ADHESION N/A 05/11/2019   Procedure: LYSIS OF ADHESION;  Surgeon: Rolm Bookbinder, MD;  Location: [facility];  Service: General;  Laterality: N/A;  . PATELLAR TENDON REPAIR Left 02/11/2019   Procedure: LEFT PATELLA TENDON REPAIR;  Surgeon: Altamese [place], MD;  Location: [facility];  Service: Orthopedics;  Laterality: Left;  . SMALL INTESTINE SURGERY  2004  . US ECHOCARDIOGRAPHY  09/05/2009   ef 55-60%    There were no vitals filed for this visit.  Subjective Assessment - 09/01/19 1454    Subjective  "I'm doing good." Wife states that he has been a little more groggy today.    Pertinent History  PMH: recent hospitilization 05/22/19 atrial fibrillation, dementia, essential hypertension, HLD, diastolic congestive heart failure, aortic valve regurgitation, BPH, and recent small bowel obstruction with recent surgical intervention with lysis of adhesions    How long can you stand comfortably?  can not stand on his own    How long can you walk comfortably?  can not walk on his own  Patient Stated Goals  unable to state, his wife would like to see him walk again    Currently in Pain?  No/denies                   09/05/19 0001  Transfers  Lateral/Scoot Transfers 3: Mod assist;4: Min Forensic psychologist Details (indicate cue type and reason) Pt needing multimodal verbal and demonstrative cues for anterior weight shift and for scooting across slide board. Pt less motivated to participate in transfer today, at times needed mod A to help assist get to mat table. At one point pt stopped during transfer on board and repeated "what do you want  me to do?" with constant cues from therapist to keep scooting on board.   IT consultant Assistance 5: Mining engineer cues for precautions/safety;Visual cues/gestures for Designer, industrial/product Both upper extremities;Both lower extermities  Distance  (115', plus 3 x 40')  Comments Initial cueing for hand placement, performed throughout therapy gym with BUE and BLE. Pt with improvement in performing turns with w/c today without min A from therapist  Exercises  Other Exercises  At edge of mat: 1 x 10 reps LAQs, with cueing for slow controlled movement, multiple reps of pt reaching forward to touch cone for core activation and then leaning back against chair propped up as posterior back support.- pt getting more agitated the more reps he was asked to do   Knee/Hip Exercises: Aerobic  Other Aerobic Scifit level 2.0 x10 minutes pt able to maintain a steady pace with no cues - performed from wheelchair with incline blocks behind wheels to prevent W/C from moving.                     PT Short Term Goals - 08/25/19 1455      PT SHORT TERM GOAL #1   Title  Pt will be I and compliant with initial HEP. (target for STG 6 weeks 10/9)    Baseline  wife says he is compliant with caregiver guidance    Status  Achieved      PT SHORT TERM GOAL #2   Title  Pt will be able to perform sit to stand with mod assist.    Baseline  08/25/19: continues to need up to max assist of 2 people at times, not consistent with standing    Status  Not Met        PT Long Term Goals - 08/11/19 1700      PT LONG TERM GOAL #1   Title  Pt will be I and compliant with final HEP. (target for all LTG 12 weeks 09/30/19)    Status  On-going      PT LONG TERM GOAL #2   Title  Pt will be able to perform sit to stand mod I.    Baseline  total    Status  On-going      PT LONG TERM GOAL #3   Title  Pt will be able perform  stand pivot transfers min A to decrease caregiver burden.    Baseline  total, can now perform slideboard transfer min to mod A and may neeed to adjust goal for this    Status  On-going      PT LONG TERM GOAL #4   Title  Pt will be able to tolerate standing for 5 min with UE support.    Baseline  can  not achieve upright    Status  On-going      PT LONG TERM GOAL #5   Title  Pt will improve LE strength to at least 4/5 MMT bilat    Baseline  2-3    Status  On-going            09/05/19 1952  Plan  Clinical Impression Statement Pt with less participation in therapy today, needed frequent encouragement and cues for sliding board transfer from w/c to mat table - with pt stating "what do I need to do?" while performing transfer and verbal cues given to keep scooting, however pt required mod A for transfer today. Once in sitting, pt reported feeling more tired and was not receptive to trying any standing activity today. Pt is progressing slowly - will continue to progress towards LTGs.  Personal Factors and Comorbidities Age;Comorbidity 1;Comorbidity 2;Comorbidity 3+;Past/Current Experience;Fitness  Comorbidities PMH: recent hospitilization 05/22/19 atrial fibrillation, dementia, essential hypertension, HLD, diastolic congestive heart failure, aortic valve regurgitation, BPH, and recent small bowel obstruction with recent surgical intervention with lysis of adhesions  Examination-Activity Limitations  (all ADL's)  Examination-Participation Restrictions  (All)  Pt will benefit from skilled therapeutic intervention in order to improve on the following deficits Decreased balance;Abnormal gait;Decreased endurance;Decreased range of motion;Decreased strength;Decreased mobility;Difficulty walking;Postural dysfunction;Pain  Stability/Clinical Decision Making Evolving/Moderate complexity  Rehab Potential Fair  PT Frequency Other (comment) (2-3)  PT Duration 12 weeks  PT Treatment/Interventions  ADLs/Self Care Home Management;Aquatic Therapy;Cryotherapy;Moist Heat;DME Instruction;Gait training;Functional mobility training;Therapeutic activities;Therapeutic exercise;Balance training;Neuromuscular re-education;Manual techniques;Wheelchair mobility training;Patient/family education;Passive range of motion;Taping  PT Next Visit Plan scift from wheelchair to warm up; continue to focus on self wheelchair propulsion, slide board transfer to/from high/low table, from high low table try stedy lift assist with mat table elevated with 2 person assist. allow time for pt to self initaiate once cued on step by step what is expected of him. continue to work on unsupported sitting posture and balance as well.  PT Home Exercise Plan LAQ, sitting marches, sitting hamstring curls, SLR  Consulted and Agree with Plan of Care Patient;Family member/caregiver  Family Member Consulted wife       Patient will benefit from skilled therapeutic intervention in order to improve the following deficits and impairments:     Visit Diagnosis: Other abnormalities of gait and mobility  Chronic pain of left knee  Muscle weakness (generalized)     Problem List Patient Active Problem List   Diagnosis Date Noted  . Bacteremia due to Enterococcus 05/24/2019  . Urinary tract infection 05/24/2019  . Chronic diastolic CHF (congestive heart failure) ([place]) 05/22/2019  . Gout 05/22/2019  . Sepsis ([place]) 05/22/2019  . Protein-calorie malnutrition, severe 05/18/2019  . AKI (acute kidney injury) ([place]) 05/05/2019  . Transient hypotension 05/05/2019  . Sepsis, Gram negative ([place]) 03/18/2019  . Hypoalbuminemia   . Leukocytosis   . Hyperglycemia   . Persistent atrial fibrillation with RVR ([place])   . Acute on chronic anemia   . Rupture of left patellar tendon 02/15/2019  . Postoperative pain   . Bipolar 1 disorder ([place]) 02/14/2019  . Dementia without behavioral disturbance ([place]) 02/14/2019  . Patellar tendon rupture,  left, initial encounter 02/10/2019  . SIRS (systemic inflammatory response syndrome) ([place]) 02/09/2019  . Osteomyelitis ([place]) 02/09/2019  . Acute blood loss anemia 02/09/2019  . Aortic valve regurgitation 10/16/2018  . SBO (small bowel obstruction) ([place]) 07/08/2013  . Hyperlipidemia 12/20/2011  . HTN (hypertension) 06/04/2011  . Atrial fibrillation, chronic ([place]) 03/01/2011  .  Long term (current) use of anticoagulants 03/01/2011  . GERD 10/11/2009  . DIVERTICULOSIS OF COLON 10/11/2009    Arliss Journey , PT, DPT 09/02/2019, 1:52 PM  [facility] [address] [place], [place], [address] Phone: [phone]   Fax:  [phone]  Name: Russell Dillon MRN: [id number] Date of Birth: 01-26-1946

## 2019-09-03 DIAGNOSIS — E538 Deficiency of other specified B group vitamins: Secondary | ICD-10-CM | POA: Diagnosis not present

## 2019-09-06 ENCOUNTER — Ambulatory Visit: Payer: Medicare Other | Admitting: Physical Therapy

## 2019-09-07 ENCOUNTER — Other Ambulatory Visit: Payer: Self-pay

## 2019-09-07 ENCOUNTER — Ambulatory Visit: Payer: Medicare Other | Admitting: Physical Therapy

## 2019-09-07 DIAGNOSIS — M6281 Muscle weakness (generalized): Secondary | ICD-10-CM

## 2019-09-07 DIAGNOSIS — R2689 Other abnormalities of gait and mobility: Secondary | ICD-10-CM

## 2019-09-07 DIAGNOSIS — G8929 Other chronic pain: Secondary | ICD-10-CM | POA: Diagnosis not present

## 2019-09-07 DIAGNOSIS — M25562 Pain in left knee: Secondary | ICD-10-CM | POA: Diagnosis not present

## 2019-09-07 IMAGING — DX PORTABLE CHEST - 1 VIEW
1 series · 1 of 1 positions shown · non-contrast
Comparison: 05/12/2019

CLINICAL DATA: Fevers, history of recent surgery for small bowel
obstruction

EXAM:
PORTABLE CHEST 1 VIEW

[chest ap]
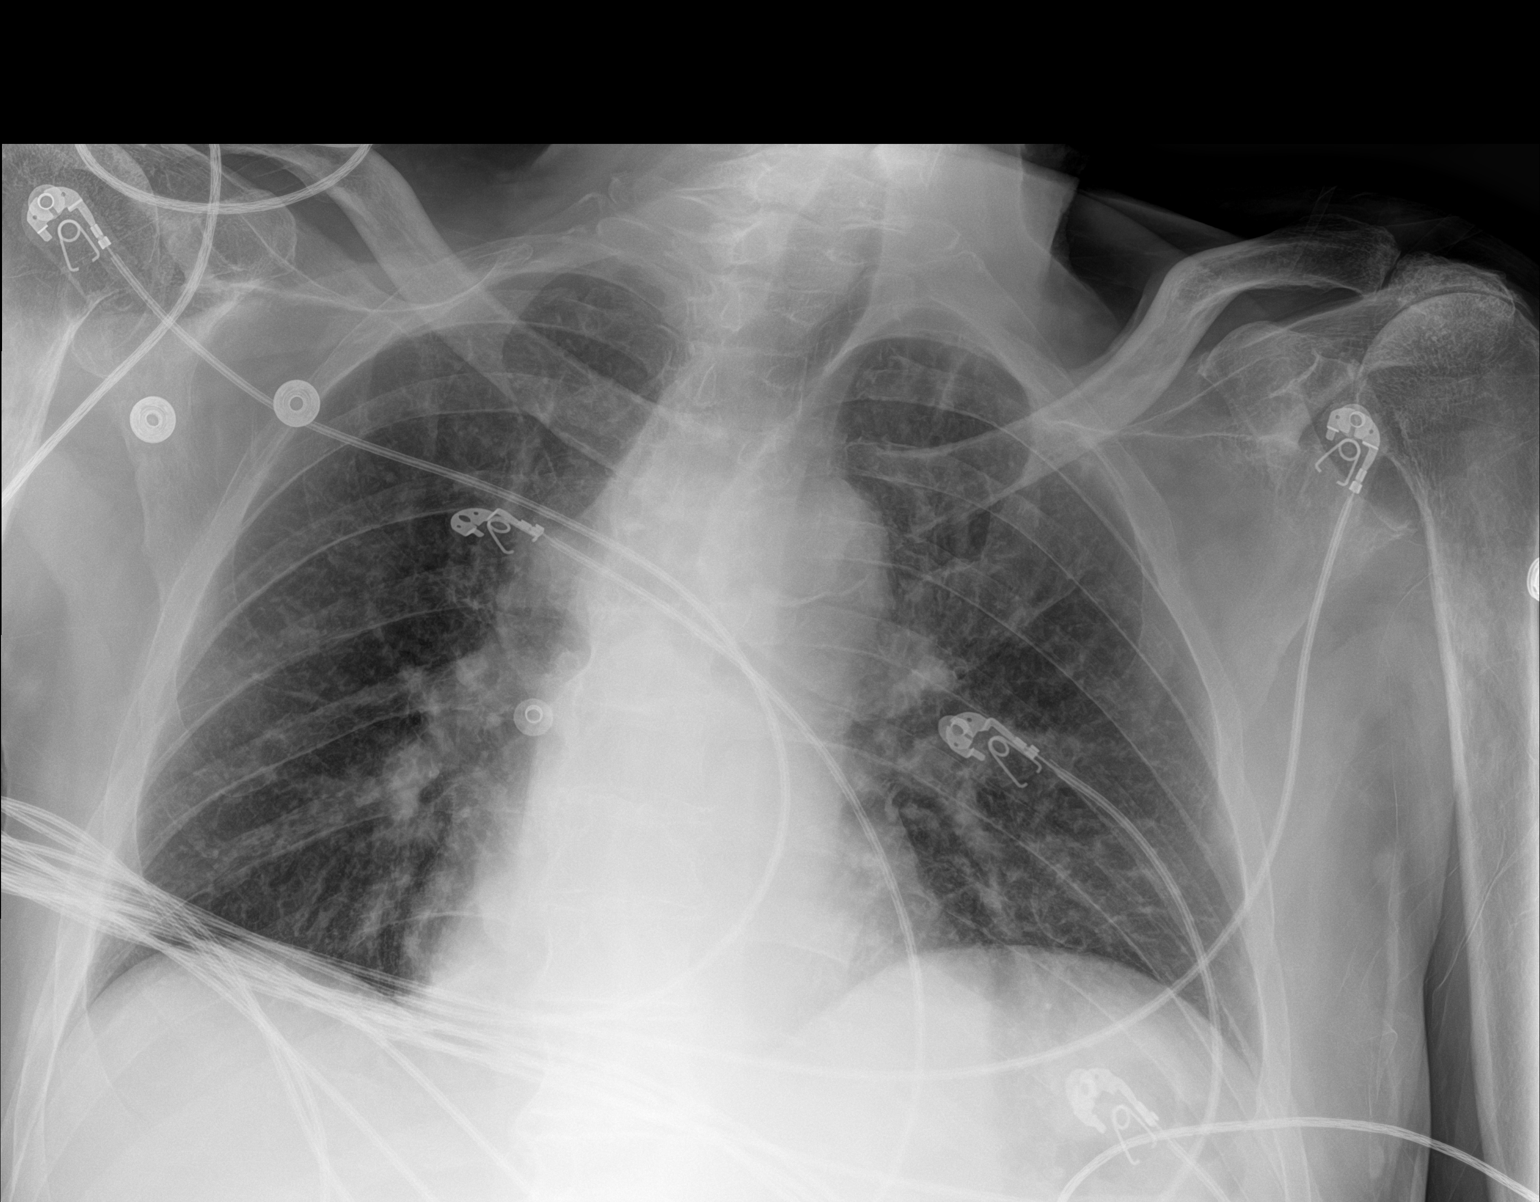

[1 of 1 positions shown; findings below may reference images not displayed]

FINDINGS: Cardiac shadow is stable. Aortic calcifications are seen. Lungs are
hypoinflated but clear. No acute abnormality noted. Right-sided PICC
line has been removed in the interval.
IMPRESSION: No acute abnormality noted.

## 2019-09-07 NOTE — Therapy (Signed)
[facility] [address] [place], [place], [address] Phone: [phone]   Fax:  [phone]  Physical Therapy Treatment  Patient Details  Name: Russell Dillon MRN: [id number] Date of Birth: 1946/02/15 Referring Provider (PT): Marton Redwood, MD   Encounter Date: 09/07/2019  PT End of Session - 09/07/19 1424    Visit Number  17    Number of Visits  24    Date for PT Re-Evaluation  09/30/19    Authorization Type  MCR, AARP    PT Start Time  1150    PT Stop Time  1230    PT Time Calculation (min)  40 min    Equipment Utilized During Treatment  Gait belt    Activity Tolerance  Patient tolerated treatment well    Behavior During Therapy  Flat affect;WFL for tasks assessed/performed   mostly cooperative today      Past Medical History:  Diagnosis Date  . Acute metabolic encephalopathy 05/04/7627  . Aortic valve regurgitation 10/16/2018   Echo 07/28/2017:  EF 60-65, moderate AI, aortic root and ascending aorta mildly dilated (40 mm), ascending aorta 43 mm, MAC, mild MR, mild LAE, moderate RV enlargement, trivial TR // Echo 12/19: EF 60-65, normal wall motion, mild AI, moderate BAE, ascending aorta 43 mm, aortic root 39 mm  . BPH (benign prostatic hyperplasia)   . BPH (benign prostatic hypertrophy)   . Chronic anticoagulation   . Chronic atrial fibrillation ([place])   . Diastolic dysfunction October 2010   Normal LV systolic function  . Hx SBO   . Hyperlipidemia   . Hypertension   . Patellar tendon rupture, left, initial encounter 02/10/2019  . Severe sepsis ([place]) 05/05/2019  . Small bowel obstruction [facility])     Past Surgical History:  Procedure Laterality Date  . APPENDECTOMY  2004  . APPLICATION OF WOUND VAC Left 02/11/2019   Procedure: Application Of Wound Vac;  Surgeon: Altamese Kis[place], MD;  Location: [place];  Service: Orthopedics;  Laterality: Left;  . CARDIOVASCULAR STRESS TEST  09/30/2007   EF 67%  No ischemia.    Marland Kitchen CARDIOVERSION  05/01/2004  . KNEE ARTHROSCOPY Left 02/11/2019   Procedure: ARTHROSCOPY LEFT KNEE;  Surgeon: Altamese [place]town, MD;  Location: [place];  Service: Orthopedics;  Laterality: Left;  . LAPAROTOMY N/A 05/11/2019   Procedure: EXPLORATORY LAPAROTOMY;  Surgeon: Rolm Bookbinder, MD;  Location: [facility];  Service: General;  Laterality: N/A;  . LYSIS OF ADHESION N/A 05/11/2019   Procedure: LYSIS OF ADHESION;  Surgeon: Rolm Bookbinder, MD;  Location: [place];  Service: General;  Laterality: N/A;  . PATELLAR TENDON REPAIR Left 02/11/2019   Procedure: LEFT PATELLA TENDON REPAIR;  Surgeon: Altamese [place]on, MD;  Location: [place];  Service: Orthopedics;  Laterality: Left;  . SMALL INTESTINE SURGERY  2004  . US ECHOCARDIOGRAPHY  09/05/2009   ef 55-60%    There were no vitals filed for this visit.  Subjective Assessment - 09/07/19 1409    Subjective  No complaints today, missed last appointment due to transportation.    Pertinent History  PMH: recent hospitilization 05/22/19 atrial fibrillation, dementia, essential hypertension, HLD, diastolic congestive heart failure, aortic valve regurgitation, BPH, and recent small bowel obstruction with recent surgical intervention with lysis of adhesions    Currently in Pain?  No/denies       [facility] Adult PT Treatment/Exercise - 09/07/19 0001      Transfers   Sit to Stand Details  Tactile cues for weight shifting;Tactile  cues for sequencing;Tactile cues for initiation;Manual facilitation for weight shifting;Verbal cues for safe use of DME/AE;Verbal cues for sequencing;Verbal cues for technique;Visual cues for safe use of DME/AE    Sit to Stand Details (indicate cue type and reason)  using steady, performed 2 reps with about 5 min each rep spent on seat with a few reps of standing to adjust himself more upright on the seat    Lateral/Scoot Transfers  4: Min assist   CGA to min assist no slidboard needed, no cuing needed   Lateral/Scoot Transfer Details (indicate  cue type and reason)  Engineer, production  Yes    Wheelchair Assistance  5: Supervision    Wheelchair Assistance Details  Verbal cues for precautions/safety;Visual cues/gestures for Astronomer  Both upper extremities;Both lower extermities    Distance  115 X 2    Comments  needs occasional assistance to avoid running into objects      Exercises   Other Exercises   edge of mat sitting balance with  seated reaches all planes and directions, one episode on LOB postreiorly but able to self correct before needing physical assistance      Knee/Hip Exercises: Aerobic   Other Aerobic  Scifit level 2.5 x15 minutes pt able to maintain a steady pace with no cues - performed from wheelchair with incline blocks behind wheels to prevent W/C from moving.                 PT Short Term Goals - 08/25/19 1455      PT SHORT TERM GOAL #1   Title  Pt will be I and compliant with initial HEP. (target for STG 6 weeks 10/9)    Baseline  wife says he is compliant with caregiver guidance    Status  Achieved      PT SHORT TERM GOAL #2   Title  Pt will be able to perform sit to stand with mod assist.    Baseline  08/25/19: continues to need up to max assist of 2 people at times, not consistent with standing    Status  Not Met        PT Long Term Goals - 08/11/19 1700      PT LONG TERM GOAL #1   Title  Pt will be I and compliant with final HEP. (target for all LTG 12 weeks 09/30/19)    Status  On-going      PT LONG TERM GOAL #2   Title  Pt will be able to perform sit to stand mod I.    Baseline  total    Status  On-going      PT LONG TERM GOAL #3   Title  Pt will be able perform stand pivot transfers min A to decrease caregiver burden.    Baseline  total, can now perform slideboard transfer min to mod A and may neeed to adjust goal for this    Status  On-going      PT LONG TERM GOAL #4   Title  Pt will be able to tolerate  standing for 5 min with UE support.    Baseline  can not achieve upright    Status  On-going      PT LONG TERM GOAL #5   Title  Pt will improve LE strength to at least 4/5 MMT bilat    Baseline  2-3  Status  On-going            Plan - 09/07/19 1425    Clinical Impression Statement  He came for morning session and had much better mood, was more alert, more cooperative with session. He did not get agitated with standing activity like he has in the past. He showed improved performance with self wheelchair propulsion and lateral scoot transfers. His wife was encouraged to try to schedule more morning appointments vs late afternoon as he may be more cooperative in PT with this.    Personal Factors and Comorbidities  Age;Comorbidity 1;Comorbidity 2;Comorbidity 3+;Past/Current Experience;Fitness    Comorbidities  PMH: recent hospitilization 05/22/19 atrial fibrillation, dementia, essential hypertension, HLD, diastolic congestive heart failure, aortic valve regurgitation, BPH, and recent small bowel obstruction with recent surgical intervention with lysis of adhesions    Examination-Activity Limitations  --   all ADL's   Examination-Participation Restrictions  --   All   Stability/Clinical Decision Making  Evolving/Moderate complexity    Rehab Potential  Fair    PT Frequency  Other (comment)   2-3   PT Duration  12 weeks    PT Treatment/Interventions  ADLs/Self Care Home Management;Aquatic Therapy;Cryotherapy;Moist Heat;DME Instruction;Gait training;Functional mobility training;Therapeutic activities;Therapeutic exercise;Balance training;Neuromuscular re-education;Manual techniques;Wheelchair mobility training;Patient/family education;Passive range of motion;Taping    PT Next Visit Plan  scift from wheelchair to warm up; continue to focus on self wheelchair propulsion, slide board transfer to/from high/low table, from high low table try stedy lift assist with mat table elevated with 2 person  assist. allow time for pt to self initaiate once cued on step by step what is expected of him. continue to work on unsupported sitting posture and balance as well.    PT Home Exercise Plan  LAQ, sitting marches, sitting hamstring curls, SLR    Consulted and Agree with Plan of Care  Patient;Family member/caregiver    Family Member Consulted  wife       Patient will benefit from skilled therapeutic intervention in order to improve the following deficits and impairments:  Decreased balance, Abnormal gait, Decreased endurance, Decreased range of motion, Decreased strength, Decreased mobility, Difficulty walking, Postural dysfunction, Pain  Visit Diagnosis: Other abnormalities of gait and mobility  Chronic pain of left knee  Muscle weakness (generalized)     Problem List Patient Active Problem List   Diagnosis Date Noted  . Bacteremia due to Enterococcus 05/24/2019  . Urinary tract infection 05/24/2019  . Chronic diastolic CHF (congestive heart failure) ([place]) 05/22/2019  . Gout 05/22/2019  . Sepsis ([place]) 05/22/2019  . Protein-calorie malnutrition, severe 05/18/2019  . AKI (acute kidney injury) ([place]) 05/05/2019  . Transient hypotension 05/05/2019  . Sepsis, Gram negative ([place]) 03/18/2019  . Hypoalbuminemia   . Leukocytosis   . Hyperglycemia   . Persistent atrial fibrillation with RVR ([place])   . Acute on chronic anemia   . Rupture of left patellar tendon 02/15/2019  . Postoperative pain   . Bipolar 1 disorder ([place]) 02/14/2019  . Dementia without behavioral disturbance ([place]) 02/14/2019  . Patellar tendon rupture, left, initial encounter 02/10/2019  . SIRS (systemic inflammatory response syndrome) ([place]) 02/09/2019  . Osteomyelitis ([place]) 02/09/2019  . Acute blood loss anemia 02/09/2019  . Aortic valve regurgitation 10/16/2018  . SBO (small bowel obstruction) ([place]) 07/08/2013  . Hyperlipidemia 12/20/2011  . HTN (hypertension) 06/04/2011  . Atrial fibrillation, chronic ([place])  03/01/2011  . Long term (current) use of anticoagulants 03/01/2011  . GERD 10/11/2009  . DIVERTICULOSIS OF COLON 10/11/2009  Silvestre Mesi 09/07/2019, 2:44 PM  [facility] [address] [place], [place], [address] Phone: [phone]   Fax:  [phone]  Name: KELVEN FLATER MRN: [id number] Date of Birth: 1946/05/07

## 2019-09-08 ENCOUNTER — Ambulatory Visit: Payer: Medicare Other | Admitting: Physical Therapy

## 2019-09-08 DIAGNOSIS — M25562 Pain in left knee: Secondary | ICD-10-CM

## 2019-09-08 DIAGNOSIS — G8929 Other chronic pain: Secondary | ICD-10-CM | POA: Diagnosis not present

## 2019-09-08 DIAGNOSIS — M6281 Muscle weakness (generalized): Secondary | ICD-10-CM | POA: Diagnosis not present

## 2019-09-08 DIAGNOSIS — R2689 Other abnormalities of gait and mobility: Secondary | ICD-10-CM

## 2019-09-08 NOTE — Therapy (Signed)
[facility] [address] [place], [place], [address] Phone: [phone]   Fax:  [phone]  Physical Therapy Treatment  Patient Details  Name: Russell Dillon MRN: [id number] Date of Birth: 1946-03-20 Referring Provider (PT): Marton Redwood, MD   Encounter Date: 09/08/2019  PT End of Session - 09/08/19 2011    Visit Number  18    Number of Visits  24    Date for PT Re-Evaluation  09/30/19    Authorization Type  needs KX, MCR, AARP    PT Start Time  7169    PT Stop Time  1530    PT Time Calculation (min)  45 min    Equipment Utilized During Treatment  Gait belt    Activity Tolerance  Patient tolerated treatment well    Behavior During Therapy  Flat affect;WFL for tasks assessed/performed   mostly cooperative today      Past Medical History:  Diagnosis Date  . Acute metabolic encephalopathy 6/78/9381  . Aortic valve regurgitation 10/16/2018   Echo 07/28/2017:  EF 60-65, moderate AI, aortic root and ascending aorta mildly dilated (40 mm), ascending aorta 43 mm, MAC, mild MR, mild LAE, moderate RV enlargement, trivial TR // Echo 12/19: EF 60-65, normal wall motion, mild AI, moderate BAE, ascending aorta 43 mm, aortic root 39 mm  . BPH (benign prostatic hyperplasia)   . BPH (benign prostatic hypertrophy)   . Chronic anticoagulation   . Chronic atrial fibrillation ([place])   . Diastolic dysfunction October 2010   Normal LV systolic function  . Hx SBO   . Hyperlipidemia   . Hypertension   . Patellar tendon rupture, left, initial encounter 02/10/2019  . Severe sepsis ([place]) 05/05/2019  . Small bowel obstruction [facility])     Past Surgical History:  Procedure Laterality Date  . APPENDECTOMY  2004  . APPLICATION OF WOUND VAC Left 02/11/2019   Procedure: Application Of Wound Vac;  Surgeon: Altamese [place], MD;  Location: [place];  Service: Orthopedics;  Laterality: Left;  . CARDIOVASCULAR STRESS TEST  09/30/2007   EF 67%  No  ischemia.   Marland Kitchen CARDIOVERSION  05/01/2004  . KNEE ARTHROSCOPY Left 02/11/2019   Procedure: ARTHROSCOPY LEFT KNEE;  Surgeon: Altamese [place], MD;  Location: [place];  Service: Orthopedics;  Laterality: Left;  . LAPAROTOMY N/A 05/11/2019   Procedure: EXPLORATORY LAPAROTOMY;  Surgeon: Rolm Bookbinder, MD;  Location: [facility];  Service: General;  Laterality: N/A;  . LYSIS OF ADHESION N/A 05/11/2019   Procedure: LYSIS OF ADHESION;  Surgeon: Rolm Bookbinder, MD;  Location: [place];  Service: General;  Laterality: N/A;  . PATELLAR TENDON REPAIR Left 02/11/2019   Procedure: LEFT PATELLA TENDON REPAIR;  Surgeon: Altamese [place], MD;  Location: [place];  Service: Orthopedics;  Laterality: Left;  . SMALL INTESTINE SURGERY  2004  . US ECHOCARDIOGRAPHY  09/05/2009   ef 55-60%    There were no vitals filed for this visit.  Subjective Assessment - 09/08/19 1646    Subjective  Relays he hopes to walk again, no pain reported    Pertinent History  PMH: recent hospitilization 05/22/19 atrial fibrillation, dementia, essential hypertension, HLD, diastolic congestive heart failure, aortic valve regurgitation, BPH, and recent small bowel obstruction with recent surgical intervention with lysis of adhesions    Limitations  Walking;Standing    How long can you stand comfortably?  can not stand on his own    How long can you walk comfortably?  can not walk on his  own    Patient Stated Goals  unable to state, his wife would like to see him walk again                       [facility] Adult PT Treatment/Exercise - 09/08/19 0001      Transfers   Sit to Stand Details  Tactile cues for weight shifting;Tactile cues for sequencing;Tactile cues for initiation;Manual facilitation for weight shifting;Verbal cues for safe use of DME/AE;Verbal cues for sequencing;Verbal cues for technique;Visual cues for safe use of DME/AE    Sit to Stand Details (indicate cue type and reason)  using steady, min A with only one person today,  needs cues for hand placement and to pull with his arms but able to perform X 3 reps today and stayed in steady resting on seat about 5 min each time. Able to pull with his arms more and  perform more reps standing more upright in steady to adjust his sitting postion on the pads. Then performed 2 reps on sit to stand max A with him holding onto to PT with standing 30 sec each time but still not able to achieve upright postion. Moved to parallel bars and he was able to stand about 45 sec more upright holding onto bars with still max A and not able to achieve full upright    Lateral/Scoot Transfers  4: Min guard    Lateral/Scoot Transfer Details (indicate cue type and reason)  X2   did not need physical assistance for first time     Chief Technology Officer  Yes    Wheelchair Assistance  5: Supervision    Wheelchair Assistance Details  Verbal cues for precautions/safety;Visual cues/gestures for Astronomer  Both upper extremities;Both lower extermities    Distance  115 X 2    Comments  needs occasional cues to avoid objects      Exercises   Other Exercises   edge of mat sitting balance with  seated reaches all planes and directions      Knee/Hip Exercises: Aerobic   Other Aerobic  Scifit level 3.0 x12 minutes pt able to maintain a steady pace with no cues - performed from wheelchair with incline blocks behind wheels to prevent W/C from moving.                 PT Short Term Goals - 08/25/19 1455      PT SHORT TERM GOAL #1   Title  Pt will be I and compliant with initial HEP. (target for STG 6 weeks 10/9)    Baseline  wife says he is compliant with caregiver guidance    Status  Achieved      PT SHORT TERM GOAL #2   Title  Pt will be able to perform sit to stand with mod assist.    Baseline  08/25/19: continues to need up to max assist of 2 people at times, not consistent with standing    Status  Not Met        PT Long Term Goals -  08/11/19 1700      PT LONG TERM GOAL #1   Title  Pt will be I and compliant with final HEP. (target for all LTG 12 weeks 09/30/19)    Status  On-going      PT LONG TERM GOAL #2   Title  Pt will be able to perform sit to stand mod I.  Baseline  total    Status  On-going      PT LONG TERM GOAL #3   Title  Pt will be able perform stand pivot transfers min A to decrease caregiver burden.    Baseline  total, can now perform slideboard transfer min to mod A and may neeed to adjust goal for this    Status  On-going      PT LONG TERM GOAL #4   Title  Pt will be able to tolerate standing for 5 min with UE support.    Baseline  can not achieve upright    Status  On-going      PT LONG TERM GOAL #5   Title  Pt will improve LE strength to at least 4/5 MMT bilat    Baseline  2-3    Status  On-going            Plan - 09/08/19 2012    Clinical Impression Statement  Again had better mood, motivation and cooperation with PT today. He verbalized he wants to be able to walk again and this is the first time he has stated this. Hopefully with new found motivation and cooperation he can progress more with PT. He had his most productive session yet, was able transfer himself from chair to bed with lateral scoot without requiring assistance and able to stand 6 times total today requiring overall less assistance (mod A one person with standing in steady  X3 and max standing holding onto PT X 3). He will continue to benefit from skilled PT.    Personal Factors and Comorbidities  Age;Comorbidity 1;Comorbidity 2;Comorbidity 3+;Past/Current Experience;Fitness    Comorbidities  PMH: recent hospitilization 05/22/19 atrial fibrillation, dementia, essential hypertension, HLD, diastolic congestive heart failure, aortic valve regurgitation, BPH, and recent small bowel obstruction with recent surgical intervention with lysis of adhesions    Examination-Activity Limitations  --   all ADL's    Examination-Participation Restrictions  --   All   Stability/Clinical Decision Making  Evolving/Moderate complexity    Rehab Potential  Fair    PT Frequency  Other (comment)   2-3   PT Duration  12 weeks    PT Treatment/Interventions  ADLs/Self Care Home Management;Aquatic Therapy;Cryotherapy;Moist Heat;DME Instruction;Gait training;Functional mobility training;Therapeutic activities;Therapeutic exercise;Balance training;Neuromuscular re-education;Manual techniques;Wheelchair mobility training;Patient/family education;Passive range of motion;Taping    PT Next Visit Plan  scift from wheelchair to warm up; continue to focus on self wheelchair propulsion, slide board transfer to/from high/low table, from high low table try stedy lift assist with mat table elevated with 2 person assist. allow time for pt to self initaiate once cued on step by step what is expected of him. continue to work on unsupported sitting posture and balance as well.    PT Home Exercise Plan  LAQ, sitting marches, sitting hamstring curls, SLR    Consulted and Agree with Plan of Care  Patient;Family member/caregiver    Family Member Consulted  wife       Patient will benefit from skilled therapeutic intervention in order to improve the following deficits and impairments:  Decreased balance, Abnormal gait, Decreased endurance, Decreased range of motion, Decreased strength, Decreased mobility, Difficulty walking, Postural dysfunction, Pain  Visit Diagnosis: Other abnormalities of gait and mobility  Chronic pain of left knee  Muscle weakness (generalized)     Problem List Patient Active Problem List   Diagnosis Date Noted  . Bacteremia due to Enterococcus 05/24/2019  . Urinary tract infection 05/24/2019  . Chronic diastolic  CHF (congestive heart failure) ([place]) 05/22/2019  . Gout 05/22/2019  . Sepsis ([place]) 05/22/2019  . Protein-calorie malnutrition, severe 05/18/2019  . AKI (acute kidney injury) ([place]) 05/05/2019  .  Transient hypotension 05/05/2019  . Sepsis, Gram negative ([place]) 03/18/2019  . Hypoalbuminemia   . Leukocytosis   . Hyperglycemia   . Persistent atrial fibrillation with RVR ([place])   . Acute on chronic anemia   . Rupture of left patellar tendon 02/15/2019  . Postoperative pain   . Bipolar 1 disorder ([place]) 02/14/2019  . Dementia without behavioral disturbance ([place]) 02/14/2019  . Patellar tendon rupture, left, initial encounter 02/10/2019  . SIRS (systemic inflammatory response syndrome) ([place]) 02/09/2019  . Osteomyelitis ([place]) 02/09/2019  . Acute blood loss anemia 02/09/2019  . Aortic valve regurgitation 10/16/2018  . SBO (small bowel obstruction) ([place]) 07/08/2013  . Hyperlipidemia 12/20/2011  . HTN (hypertension) 06/04/2011  . Atrial fibrillation, chronic ([place]) 03/01/2011  . Long term (current) use of anticoagulants 03/01/2011  . GERD 10/11/2009  . DIVERTICULOSIS OF COLON 10/11/2009    Russell Dillon 09/08/2019, 8:23 PM  [facility] [address] [place], [place], [address] Phone: [phone]   Fax:  [phone]  Name: Russell Dillon MRN: [id number] Date of Birth: 1945/12/28

## 2019-09-13 ENCOUNTER — Other Ambulatory Visit: Payer: Self-pay

## 2019-09-13 ENCOUNTER — Ambulatory Visit: Payer: Medicare Other | Attending: Internal Medicine | Admitting: Physical Therapy

## 2019-09-13 DIAGNOSIS — G8929 Other chronic pain: Secondary | ICD-10-CM | POA: Diagnosis not present

## 2019-09-13 DIAGNOSIS — M6281 Muscle weakness (generalized): Secondary | ICD-10-CM | POA: Diagnosis not present

## 2019-09-13 DIAGNOSIS — M25562 Pain in left knee: Secondary | ICD-10-CM | POA: Insufficient documentation

## 2019-09-13 DIAGNOSIS — R2689 Other abnormalities of gait and mobility: Secondary | ICD-10-CM | POA: Diagnosis not present

## 2019-09-13 NOTE — Therapy (Signed)
[facility] [address] [place], [place], [address] Phone: [phone]   Fax:  [phone]  Physical Therapy Treatment  Patient Details  Name: Russell Dillon MRN: [id number] Date of Birth: 1946/09/12 Referring Provider (PT): Marton Redwood, MD   Encounter Date: 09/13/2019  PT End of Session - 09/13/19 1311    Visit Number  19    Number of Visits  24    Date for PT Re-Evaluation  09/30/19    Authorization Type  needs KX, MCR, AARP    PT Start Time  1110    PT Stop Time  1150    PT Time Calculation (min)  40 min    Equipment Utilized During Treatment  Gait belt    Activity Tolerance  Patient tolerated treatment well    Behavior During Therapy  Flat affect;WFL for tasks assessed/performed   mostly cooperative today      Past Medical History:  Diagnosis Date  . Acute metabolic encephalopathy 0/92/3300  . Aortic valve regurgitation 10/16/2018   Echo 07/28/2017:  EF 60-65, moderate AI, aortic root and ascending aorta mildly dilated (40 mm), ascending aorta 43 mm, MAC, mild MR, mild LAE, moderate RV enlargement, trivial TR // Echo 12/19: EF 60-65, normal wall motion, mild AI, moderate BAE, ascending aorta 43 mm, aortic root 39 mm  . BPH (benign prostatic hyperplasia)   . BPH (benign prostatic hypertrophy)   . Chronic anticoagulation   . Chronic atrial fibrillation ([place])   . Diastolic dysfunction October 2010   Normal LV systolic function  . Hx SBO   . Hyperlipidemia   . Hypertension   . Patellar tendon rupture, left, initial encounter 02/10/2019  . Severe sepsis ([place]) 05/05/2019  . Small bowel obstruction [facility])     Past Surgical History:  Procedure Laterality Date  . APPENDECTOMY  2004  . APPLICATION OF WOUND VAC Left 02/11/2019   Procedure: Application Of Wound Vac;  Surgeon: Altamese [place], MD;  Location: [facility];  Service: Orthopedics;  Laterality: Left;  . CARDIOVASCULAR STRESS TEST  09/30/2007   EF 67%  No  ischemia.   Marland Kitchen CARDIOVERSION  05/01/2004  . KNEE ARTHROSCOPY Left 02/11/2019   Procedure: ARTHROSCOPY LEFT KNEE;  Surgeon: Altamese [place], MD;  Location: [facility];  Service: Orthopedics;  Laterality: Left;  . LAPAROTOMY N/A 05/11/2019   Procedure: EXPLORATORY LAPAROTOMY;  Surgeon: Rolm Bookbinder, MD;  Location: [facility];  Service: General;  Laterality: N/A;  . LYSIS OF ADHESION N/A 05/11/2019   Procedure: LYSIS OF ADHESION;  Surgeon: Rolm Bookbinder, MD;  Location: [facility];  Service: General;  Laterality: N/A;  . PATELLAR TENDON REPAIR Left 02/11/2019   Procedure: LEFT PATELLA TENDON REPAIR;  Surgeon: Altamese [place], MD;  Location: [facility];  Service: Orthopedics;  Laterality: Left;  . SMALL INTESTINE SURGERY  2004  . US ECHOCARDIOGRAPHY  09/05/2009   ef 55-60%    There were no vitals filed for this visit.  Subjective Assessment - 09/13/19 1308    Subjective  relays has been doing his HEP at home, no new complaints    Pertinent History  PMH: recent hospitilization 05/22/19 atrial fibrillation, dementia, essential hypertension, HLD, diastolic congestive heart failure, aortic valve regurgitation, BPH, and recent small bowel obstruction with recent surgical intervention with lysis of adhesions    Limitations  Walking;Standing    How long can you stand comfortably?  can not stand on his own    How long can you walk comfortably?  can not walk  on his own    Patient Stated Goals  unable to state, his wife would like to see him walk again                       [facility] Adult PT Treatment/Exercise - 09/13/19 0001      Transfers   Sit to Stand Details  Tactile cues for weight shifting;Tactile cues for sequencing;Tactile cues for initiation;Manual facilitation for weight shifting;Verbal cues for safe use of DME/AE;Verbal cues for sequencing;Verbal cues for technique;Visual cues for safe use of DME/AE    Sit to Stand Details (indicate cue type and reason)  Use of steady 3 reps with about 1 min each  staying in steady. Then progresed to max A +2 from raised mat table but still unable to achieve upright trunk, full knee extension or hip extension. Then moved to parallel bars for 3 more reps sit to stand mod A with this as he can pull with his arms on the bars and achieve more upright trunk but he has posterior lean with this.  Stood in bars 30 sec each time    Lateral/Scoot Transfers  4: Min guard    Research officer, trade union Details (indicate cue type and reason)  X2   visual demonstration then able to perform without cuing     Chief Technology Officer  Yes    Wheelchair Assistance  5: Supervision    Wheelchair Assistance Details  Verbal cues for precautions/safety;Visual cues/gestures for Astronomer  Both upper extremities;Both lower extermities    Distance  115 X 2      Exercises   Other Exercises   edge of mat sitting balance with  seated reaches all planes and directions      Knee/Hip Exercises: Aerobic   Other Aerobic  Scifit level 3.5 x15 minutes pt able to maintain a steady pace with no cues - performed from wheelchair with incline blocks behind wheels to prevent W/C from moving.                 PT Short Term Goals - 08/25/19 1455      PT SHORT TERM GOAL #1   Title  Pt will be I and compliant with initial HEP. (target for STG 6 weeks 10/9)    Baseline  wife says he is compliant with caregiver guidance    Status  Achieved      PT SHORT TERM GOAL #2   Title  Pt will be able to perform sit to stand with mod assist.    Baseline  08/25/19: continues to need up to max assist of 2 people at times, not consistent with standing    Status  Not Met        PT Long Term Goals - 08/11/19 1700      PT LONG TERM GOAL #1   Title  Pt will be I and compliant with final HEP. (target for all LTG 12 weeks 09/30/19)    Status  On-going      PT LONG TERM GOAL #2   Title  Pt will be able to perform sit to stand mod I.    Baseline  total     Status  On-going      PT LONG TERM GOAL #3   Title  Pt will be able perform stand pivot transfers min A to decrease caregiver burden.    Baseline  total, can now perform slideboard transfer min to  mod A and may neeed to adjust goal for this    Status  On-going      PT LONG TERM GOAL #4   Title  Pt will be able to tolerate standing for 5 min with UE support.    Baseline  can not achieve upright    Status  On-going      PT LONG TERM GOAL #5   Title  Pt will improve LE strength to at least 4/5 MMT bilat    Baseline  2-3    Status  On-going            Plan - 09/13/19 1312    Clinical Impression Statement  Pt continues to show some progress with lateral scoot transfers however still lacks ability for funcitonal standing due to weakness, and balance (not able to achieve full knee extension, hip extension, or upright trunk)  but his fear of standing has decreased and he participates more in PT. PT will continue to progress as able.    Personal Factors and Comorbidities  Age;Comorbidity 1;Comorbidity 2;Comorbidity 3+;Past/Current Experience;Fitness    Comorbidities  PMH: recent hospitilization 05/22/19 atrial fibrillation, dementia, essential hypertension, HLD, diastolic congestive heart failure, aortic valve regurgitation, BPH, and recent small bowel obstruction with recent surgical intervention with lysis of adhesions    Examination-Activity Limitations  --   all ADL's   Examination-Participation Restrictions  --   All   Stability/Clinical Decision Making  Evolving/Moderate complexity    Rehab Potential  Fair    PT Frequency  Other (comment)   2-3   PT Duration  12 weeks    PT Treatment/Interventions  ADLs/Self Care Home Management;Aquatic Therapy;Cryotherapy;Moist Heat;DME Instruction;Gait training;Functional mobility training;Therapeutic activities;Therapeutic exercise;Balance training;Neuromuscular re-education;Manual techniques;Wheelchair mobility training;Patient/family  education;Passive range of motion;Taping    PT Next Visit Plan  scift from wheelchair to warm up; continue to focus on self wheelchair propulsion, slide board transfer to/from high/low table, from high low table try stedy lift assist with mat table elevated with 2 person assist. allow time for pt to self initaiate once cued on step by step what is expected of him. continue to work on unsupported sitting posture and balance as well.    PT Home Exercise Plan  LAQ, sitting marches, sitting hamstring curls, SLR    Consulted and Agree with Plan of Care  Patient;Family member/caregiver    Family Member Consulted  wife       Patient will benefit from skilled therapeutic intervention in order to improve the following deficits and impairments:  Decreased balance, Abnormal gait, Decreased endurance, Decreased range of motion, Decreased strength, Decreased mobility, Difficulty walking, Postural dysfunction, Pain  Visit Diagnosis: Other abnormalities of gait and mobility  Chronic pain of left knee  Muscle weakness (generalized)     Problem List Patient Active Problem List   Diagnosis Date Noted  . Bacteremia due to Enterococcus 05/24/2019  . Urinary tract infection 05/24/2019  . Chronic diastolic CHF (congestive heart failure) ([place]) 05/22/2019  . Gout 05/22/2019  . Sepsis ([place]) 05/22/2019  . Protein-calorie malnutrition, severe 05/18/2019  . AKI (acute kidney injury) ([place]) 05/05/2019  . Transient hypotension 05/05/2019  . Sepsis, Gram negative ([place]) 03/18/2019  . Hypoalbuminemia   . Leukocytosis   . Hyperglycemia   . Persistent atrial fibrillation with RVR ([place])   . Acute on chronic anemia   . Rupture of left patellar tendon 02/15/2019  . Postoperative pain   . Bipolar 1 disorder ([place]) 02/14/2019  . Dementia without behavioral disturbance ([place]) 02/14/2019  .  Patellar tendon rupture, left, initial encounter 02/10/2019  . SIRS (systemic inflammatory response syndrome) ([place]) 02/09/2019  .  Osteomyelitis ([place]) 02/09/2019  . Acute blood loss anemia 02/09/2019  . Aortic valve regurgitation 10/16/2018  . SBO (small bowel obstruction) ([place]) 07/08/2013  . Hyperlipidemia 12/20/2011  . HTN (hypertension) 06/04/2011  . Atrial fibrillation, chronic ([place]) 03/01/2011  . Long term (current) use of anticoagulants 03/01/2011  . GERD 10/11/2009  . DIVERTICULOSIS OF COLON 10/11/2009    Silvestre Mesi 09/13/2019, 1:25 PM  [facility] [address] [place], [place], [address] Phone: [phone]   Fax:  [phone]  Name: Russell Dillon MRN: [id number] Date of Birth: 1946-03-08

## 2019-09-15 ENCOUNTER — Ambulatory Visit: Payer: Medicare Other | Admitting: Physical Therapy

## 2019-09-15 ENCOUNTER — Other Ambulatory Visit: Payer: Self-pay

## 2019-09-15 DIAGNOSIS — M6281 Muscle weakness (generalized): Secondary | ICD-10-CM

## 2019-09-15 DIAGNOSIS — M25562 Pain in left knee: Secondary | ICD-10-CM | POA: Diagnosis not present

## 2019-09-15 DIAGNOSIS — R2689 Other abnormalities of gait and mobility: Secondary | ICD-10-CM

## 2019-09-15 DIAGNOSIS — G8929 Other chronic pain: Secondary | ICD-10-CM

## 2019-09-15 NOTE — Therapy (Signed)
[facility] [address] [place], [place], [address] Phone: [phone]   Fax:  [phone]  Physical Therapy Treatment/Progress note  Progress Note reporting period 08/16/19 to 09/15/19  See below for objective and subjective measurements relating to patients progress with PT.      Patient Details  Name: Russell Dillon MRN: [id number] Date of Birth: 11/09/1946 Referring Provider (PT): Marton Redwood, MD   Encounter Date: 09/15/2019  PT End of Session - 09/15/19 1457    Visit Number  20    Number of Visits  24    Date for PT Re-Evaluation  09/30/19    Authorization Type  needs KX, MCR, AARP    PT Start Time  1100    PT Stop Time  1145    PT Time Calculation (min)  45 min    Equipment Utilized During Treatment  Gait belt    Activity Tolerance  Patient tolerated treatment well    Behavior During Therapy  Flat affect;WFL for tasks assessed/performed   mostly cooperative today      Past Medical History:  Diagnosis Date  . Acute metabolic encephalopathy 01/30/253  . Aortic valve regurgitation 10/16/2018   Echo 07/28/2017:  EF 60-65, moderate AI, aortic root and ascending aorta mildly dilated (40 mm), ascending aorta 43 mm, MAC, mild MR, mild LAE, moderate RV enlargement, trivial TR // Echo 12/19: EF 60-65, normal wall motion, mild AI, moderate BAE, ascending aorta 43 mm, aortic root 39 mm  . BPH (benign prostatic hyperplasia)   . BPH (benign prostatic hypertrophy)   . Chronic anticoagulation   . Chronic atrial fibrillation ([place])   . Diastolic dysfunction October 2010   Normal LV systolic function  . Hx SBO   . Hyperlipidemia   . Hypertension   . Patellar tendon rupture, left, initial encounter 02/10/2019  . Severe sepsis ([place]) 05/05/2019  . Small bowel obstruction [facility])     Past Surgical History:  Procedure Laterality Date  . APPENDECTOMY  2004  . APPLICATION OF WOUND VAC Left 02/11/2019   Procedure:  Application Of Wound Vac;  Surgeon: Altamese [place], MD;  Location: [facility];  Service: Orthopedics;  Laterality: Left;  . CARDIOVASCULAR STRESS TEST  09/30/2007   EF 67%  No ischemia.   Marland Kitchen CARDIOVERSION  05/01/2004  . KNEE ARTHROSCOPY Left 02/11/2019   Procedure: ARTHROSCOPY LEFT KNEE;  Surgeon: Altamese [place], MD;  Location: [facility];  Service: Orthopedics;  Laterality: Left;  . LAPAROTOMY N/A 05/11/2019   Procedure: EXPLORATORY LAPAROTOMY;  Surgeon: Rolm Bookbinder, MD;  Location: [facility];  Service: General;  Laterality: N/A;  . LYSIS OF ADHESION N/A 05/11/2019   Procedure: LYSIS OF ADHESION;  Surgeon: Rolm Bookbinder, MD;  Location: [facility];  Service: General;  Laterality: N/A;  . PATELLAR TENDON REPAIR Left 02/11/2019   Procedure: LEFT PATELLA TENDON REPAIR;  Surgeon: Altamese [place], MD;  Location: [facility];  Service: Orthopedics;  Laterality: Left;  . SMALL INTESTINE SURGERY  2004  . US ECHOCARDIOGRAPHY  09/05/2009   ef 55-60%    There were no vitals filed for this visit.  Subjective Assessment - 09/15/19 1123    Subjective  relays has been doing his HEP at home, no new complaints    Pertinent History  PMH: recent hospitilization 05/22/19 atrial fibrillation, dementia, essential hypertension, HLD, diastolic congestive heart failure, aortic valve regurgitation, BPH, and recent small bowel obstruction with recent surgical intervention with lysis of adhesions    Limitations  Walking;Standing  How long can you stand comfortably?  can not stand on his own    How long can you walk comfortably?  can not walk on his own    Patient Stated Goals  to walk again    Currently in Pain?  No/denies         [facility] PT Assessment - 09/15/19 0001      Assessment   Medical Diagnosis  LE weakness, debility, balance and falls risk    Referring Provider (PT)  Marton Redwood, MD      Strength   Overall Strength Comments  difficult to assess but bilat knee strength 4+/5 MMT, bilat hip flexion 4/5 MMT, all tested in  sitting, some difficulty following commands for MMT      Transfers   Lateral/Scoot Transfers  5: Supervision    Lateral/Scoot Transfer Details (indicate cue type and reason)  X2                   [facility] Adult PT Treatment/Exercise - 09/15/19 0001      Transfers   Sit to Stand Details  Tactile cues for posture;Tactile cues for placement;Tactile cues for weight beaing;Visual cues for safe use of DME/AE;Verbal cues for sequencing;Verbal cues for technique;Verbal cues for safe use of DME/AE    Sit to Stand Details (indicate cue type and reason)  Use of steady for 4 reps with min A  to supervision for standing then needs cues and min A for upright posture, able stand about 30 sec today without resting on the seat pads, then progressed to 3 more reps sit to stand "3 musketeers style" mod A  X 2 with manual faciliation of quad extension and upright posture, able to stand 15 sec X 2 with this. Then progressed over to sink and able to pull with his arms and stand min A of one person then he was able to hold onto counter and stand upright and balance himself about 15 seconds with only supervision X 3 reps of this.      Chief Technology Officer  Yes    Wheelchair Assistance  5: Supervision    Distance  115 X 2    Comments  verbal cues for direction and steering      Knee/Hip Exercises: Aerobic   Other Aerobic  Scifit level 4 x15 minutes pt able to maintain a steady pace with no cues - performed from wheelchair with incline blocks behind wheels to prevent W/C from moving.                 PT Short Term Goals - 09/15/19 1503      PT SHORT TERM GOAL #1   Title  Pt will be I and compliant with initial HEP. (target for STG 6 weeks 10/9)    Baseline  wife says he is compliant with caregiver guidance    Status  Achieved      PT SHORT TERM GOAL #2   Title  Pt will be able to perform sit to stand with mod assist.    Baseline  partially met as he was able to perform  this today in standing frame and at countertop,pulling from the sink, however not able to demonstrate fully funtional yet    Status  On-going        PT Long Term Goals - 09/15/19 1504      PT LONG TERM GOAL #1   Title  Pt will be I and compliant with final  HEP. (target for all LTG 12 weeks 09/30/19)    Status  On-going      PT LONG TERM GOAL #2   Title  Pt will be able to perform sit to stand mod I.    Baseline  overall min to max, inconsistent    Status  On-going      PT LONG TERM GOAL #3   Title  Pt will be able perform stand pivot transfers min A to decrease caregiver burden.    Baseline  total, can now perform slideboard transfer min A to supervision    Status  On-going      PT LONG TERM GOAL #4   Title  Pt will be able to tolerate standing for 5 min with UE support.    Baseline  can not achieve upright, progressing standing tolerance to around 30 sec at a time with UE support    Status  On-going      PT LONG TERM GOAL #5   Title  Pt will improve LE strength to at least 4/5 MMT bilat    Baseline  difficult to assess but at least 4/5    Status  Achieved            Plan - 09/15/19 1458    Clinical Impression Statement  20th visit progress note today shows he is progressing with his wheelchair propulsion, lateral scoot transfers, and sit to stand ability. He is now overall supervision to min A with wheelchair propulsion and lateral scoot transfers. He is overall now only overall mod A for standing but needs UE support of either steady frame or counter top. He was able to show upright standing for the first time today at the sink/countertop using his arms to steady himself. He has been more motivated and cooporative with PT over the last 2 weeks and is slowly progressing toward his goals. He will continue to benefit from skilled PT.    Personal Factors and Comorbidities  Age;Comorbidity 1;Comorbidity 2;Comorbidity 3+;Past/Current Experience;Fitness    Comorbidities  PMH:  recent hospitilization 05/22/19 atrial fibrillation, dementia, essential hypertension, HLD, diastolic congestive heart failure, aortic valve regurgitation, BPH, and recent small bowel obstruction with recent surgical intervention with lysis of adhesions    Examination-Activity Limitations  --   all ADL's   Examination-Participation Restrictions  --   All   Stability/Clinical Decision Making  Evolving/Moderate complexity    Rehab Potential  Fair    PT Frequency  Other (comment)   2-3   PT Duration  12 weeks    PT Treatment/Interventions  ADLs/Self Care Home Management;Aquatic Therapy;Cryotherapy;Moist Heat;DME Instruction;Gait training;Functional mobility training;Therapeutic activities;Therapeutic exercise;Balance training;Neuromuscular re-education;Manual techniques;Wheelchair mobility training;Patient/family education;Passive range of motion;Taping    PT Next Visit Plan  scift from wheelchair to warm up; continue to focus on self wheelchair propulsion, slide board transfer to/from high/low table, from high low table try stedy lift assist with mat table elevated with 2 person assist. allow time for pt to self initaiate once cued on step by step what is expected of him. continue to work on unsupported sitting posture and balance as well.    PT Home Exercise Plan  LAQ, sitting marches, sitting hamstring curls, SLR    Consulted and Agree with Plan of Care  Patient;Family member/caregiver    Family Member Consulted  wife       Patient will benefit from skilled therapeutic intervention in order to improve the following deficits and impairments:  Decreased balance, Abnormal gait, Decreased endurance, Decreased range  of motion, Decreased strength, Decreased mobility, Difficulty walking, Postural dysfunction, Pain  Visit Diagnosis: Other abnormalities of gait and mobility  Chronic pain of left knee  Muscle weakness (generalized)     Problem List Patient Active Problem List   Diagnosis Date  Noted  . Bacteremia due to Enterococcus 05/24/2019  . Urinary tract infection 05/24/2019  . Chronic diastolic CHF (congestive heart failure) ([place]) 05/22/2019  . Gout 05/22/2019  . Sepsis ([place]) 05/22/2019  . Protein-calorie malnutrition, severe 05/18/2019  . AKI (acute kidney injury) ([place]) 05/05/2019  . Transient hypotension 05/05/2019  . Sepsis, Gram negative ([place]) 03/18/2019  . Hypoalbuminemia   . Leukocytosis   . Hyperglycemia   . Persistent atrial fibrillation with RVR ([place])   . Acute on chronic anemia   . Rupture of left patellar tendon 02/15/2019  . Postoperative pain   . Bipolar 1 disorder ([place]) 02/14/2019  . Dementia without behavioral disturbance ([place]) 02/14/2019  . Patellar tendon rupture, left, initial encounter 02/10/2019  . SIRS (systemic inflammatory response syndrome) ([place]) 02/09/2019  . Osteomyelitis ([place]) 02/09/2019  . Acute blood loss anemia 02/09/2019  . Aortic valve regurgitation 10/16/2018  . SBO (small bowel obstruction) ([place]) 07/08/2013  . Hyperlipidemia 12/20/2011  . HTN (hypertension) 06/04/2011  . Atrial fibrillation, chronic ([place]) 03/01/2011  . Long term (current) use of anticoagulants 03/01/2011  . GERD 10/11/2009  . DIVERTICULOSIS OF COLON 10/11/2009    Debbe Odea 09/15/2019, 3:08 PM  [facility] [address] [place], [place], [address] Phone: [phone]   Fax:  [phone]  Name: NORMON PETTIJOHN MRN: [id number] Date of Birth: 1945/12/26

## 2019-09-17 DIAGNOSIS — Z7901 Long term (current) use of anticoagulants: Secondary | ICD-10-CM | POA: Diagnosis not present

## 2019-09-17 DIAGNOSIS — I480 Paroxysmal atrial fibrillation: Secondary | ICD-10-CM | POA: Diagnosis not present

## 2019-09-20 ENCOUNTER — Ambulatory Visit: Payer: Medicare Other | Admitting: Physical Therapy

## 2019-09-22 ENCOUNTER — Encounter: Payer: Self-pay | Admitting: Physical Therapy

## 2019-09-22 ENCOUNTER — Ambulatory Visit: Payer: Medicare Other | Admitting: Physical Therapy

## 2019-09-22 ENCOUNTER — Other Ambulatory Visit: Payer: Self-pay

## 2019-09-22 DIAGNOSIS — M6281 Muscle weakness (generalized): Secondary | ICD-10-CM

## 2019-09-22 DIAGNOSIS — R2689 Other abnormalities of gait and mobility: Secondary | ICD-10-CM

## 2019-09-22 DIAGNOSIS — M25562 Pain in left knee: Secondary | ICD-10-CM

## 2019-09-22 DIAGNOSIS — G8929 Other chronic pain: Secondary | ICD-10-CM

## 2019-09-22 NOTE — Therapy (Signed)
[facility] [address] [place], [place], [address] Phone: [phone]   Fax:  [phone]  Physical Therapy Treatment  Patient Details  Name: Russell Dillon MRN: [id number] Date of Birth: 15-Jun-1946 Referring Provider (PT): Marton Redwood, MD   Encounter Date: 09/22/2019  PT End of Session - 09/22/19 1801    Visit Number  21    Number of Visits  24    Date for PT Re-Evaluation  09/30/19    Authorization Type  needs KX, MCR, AARP, will need progress note visit 30    PT Start Time  1100    PT Stop Time  1145    PT Time Calculation (min)  45 min    Equipment Utilized During Treatment  Gait belt    Activity Tolerance  Patient tolerated treatment well    Behavior During Therapy  Flat affect;WFL for tasks assessed/performed   mostly cooperative today      Past Medical History:  Diagnosis Date  . Acute metabolic encephalopathy 3/89/3734  . Aortic valve regurgitation 10/16/2018   Echo 07/28/2017:  EF 60-65, moderate AI, aortic root and ascending aorta mildly dilated (40 mm), ascending aorta 43 mm, MAC, mild MR, mild LAE, moderate RV enlargement, trivial TR // Echo 12/19: EF 60-65, normal wall motion, mild AI, moderate BAE, ascending aorta 43 mm, aortic root 39 mm  . BPH (benign prostatic hyperplasia)   . BPH (benign prostatic hypertrophy)   . Chronic anticoagulation   . Chronic atrial fibrillation ([place])   . Diastolic dysfunction October 2010   Normal LV systolic function  . Hx SBO   . Hyperlipidemia   . Hypertension   . Patellar tendon rupture, left, initial encounter 02/10/2019  . Severe sepsis ([place]) 05/05/2019  . Small bowel obstruction [facility])     Past Surgical History:  Procedure Laterality Date  . APPENDECTOMY  2004  . APPLICATION OF WOUND VAC Left 02/11/2019   Procedure: Application Of Wound Vac;  Surgeon: Altamese [place], MD;  Location: [place];  Service: Orthopedics;  Laterality: Left;  . CARDIOVASCULAR  STRESS TEST  09/30/2007   EF 67%  No ischemia.   Marland Kitchen CARDIOVERSION  05/01/2004  . KNEE ARTHROSCOPY Left 02/11/2019   Procedure: ARTHROSCOPY LEFT KNEE;  Surgeon: Altamese [place], MD;  Location: [place];  Service: Orthopedics;  Laterality: Left;  . LAPAROTOMY N/A 05/11/2019   Procedure: EXPLORATORY LAPAROTOMY;  Surgeon: Rolm Bookbinder, MD;  Location: [employer];  Service: General;  Laterality: N/A;  . LYSIS OF ADHESION N/A 05/11/2019   Procedure: LYSIS OF ADHESION;  Surgeon: Rolm Bookbinder, MD;  Location: [place];  Service: General;  Laterality: N/A;  . PATELLAR TENDON REPAIR Left 02/11/2019   Procedure: LEFT PATELLA TENDON REPAIR;  Surgeon: Altamese Oelrichs, MD;  Location: [place];  Service: Orthopedics;  Laterality: Left;  . SMALL INTESTINE SURGERY  2004  . US ECHOCARDIOGRAPHY  09/05/2009   ef 55-60%    There were no vitals filed for this visit.  Subjective Assessment - 09/22/19 1659    Subjective  no new complaints, he has been able to practice standing at home at the kitchen sink for about 15 seconds at home with UE support and supervision/min A from wife or caregiver    Pertinent History  PMH: recent hospitilization 05/22/19 atrial fibrillation, dementia, essential hypertension, HLD, diastolic congestive heart failure, aortic valve regurgitation, BPH, and recent small bowel obstruction with recent surgical intervention with lysis of adhesions    Limitations  Walking;Standing  How long can you stand comfortably?  can not stand on his own    How long can you walk comfortably?  can not walk on his own    Patient Stated Goals  to walk again    Currently in Pain?  No/denies                       [facility] Adult PT Treatment/Exercise - 09/22/19 0001      Transfers   Sit to Stand Details (indicate cue type and reason)  sit to stand X 3 reps at sink to use UE to help assist him up, he needs min A with this, then stood about 15 sec X 3 reps, last rep progressed to just holding on with one hand  for balance, he needs cuing and manual facilitation for upright posture. Then moved to // bars and performed another 2 reps with min A to stand but then mod A for balance due to posterior lean. Then performed 3 more reps with ambulation      Ambulation/Gait   Gait Comments  ambulated in // bars full length of bars X 3 reps with mod A for balance and to facilitate weight more anteriorly as he has posterior lean, had PT student helping with close chair follow due to weakness, unsteadiness, and unable to achieve full knee extension      Wheelchair Mobility   Wheelchair Mobility  Yes    Wheelchair Assistance  5: Supervision    Distance  115 X 2    Comments  less cues overall and improved velocity       Knee/Hip Exercises: Aerobic   Other Aerobic  Scifit level 5 x10 minutes pt able to maintain a steady pace with no cues - performed from wheelchair with incline blocks behind wheels to prevent W/C from moving.                 PT Short Term Goals - 09/15/19 1503      PT SHORT TERM GOAL #1   Title  Pt will be I and compliant with initial HEP. (target for STG 6 weeks 10/9)    Baseline  wife says he is compliant with caregiver guidance    Status  Achieved      PT SHORT TERM GOAL #2   Title  Pt will be able to perform sit to stand with mod assist.    Baseline  partially met as he was able to perform this today in standing frame and at countertop,pulling from the sink, however not able to demonstrate fully funtional yet    Status  On-going        PT Long Term Goals - 09/15/19 1504      PT LONG TERM GOAL #1   Title  Pt will be I and compliant with final HEP. (target for all LTG 12 weeks 09/30/19)    Status  On-going      PT LONG TERM GOAL #2   Title  Pt will be able to perform sit to stand mod I.    Baseline  overall min to max, inconsistent    Status  On-going      PT LONG TERM GOAL #3   Title  Pt will be able perform stand pivot transfers min A to decrease caregiver burden.     Baseline  total, can now perform slideboard transfer min A to supervision    Status  On-going      PT  LONG TERM GOAL #4   Title  Pt will be able to tolerate standing for 5 min with UE support.    Baseline  can not achieve upright, progressing standing tolerance to around 30 sec at a time with UE support    Status  On-going      PT LONG TERM GOAL #5   Title  Pt will improve LE strength to at least 4/5 MMT bilat    Baseline  difficult to assess but at least 4/5    Status  Achieved            Plan - 09/22/19 1802    Clinical Impression Statement  Pt able to progress standing tolerance today, progress balance at sink to holding on with just one hand, and improve standing posture with cuing and manual facilitation. He was able to progress to taking steps for the first time today in parallell bars with mod A for sit to stand and mod A for weight shift anteriorly as he has posterior lean with gait. He needed close chair follow from PT student with this and only takes small steps without full knee extension but this was the first time since March he was able to take any steps with assitance    Personal Factors and Comorbidities  Age;Comorbidity 1;Comorbidity 2;Comorbidity 3+;Past/Current Experience;Fitness    Comorbidities  PMH: recent hospitilization 05/22/19 atrial fibrillation, dementia, essential hypertension, HLD, diastolic congestive heart failure, aortic valve regurgitation, BPH, and recent small bowel obstruction with recent surgical intervention with lysis of adhesions    Examination-Activity Limitations  --   all ADL's   Examination-Participation Restrictions  --   All   Stability/Clinical Decision Making  Evolving/Moderate complexity    Rehab Potential  Fair    PT Frequency  Other (comment)   2-3   PT Duration  12 weeks    PT Treatment/Interventions  ADLs/Self Care Home Management;Aquatic Therapy;Cryotherapy;Moist Heat;DME Instruction;Gait training;Functional mobility  training;Therapeutic activities;Therapeutic exercise;Balance training;Neuromuscular re-education;Manual techniques;Wheelchair mobility training;Patient/family education;Passive range of motion;Taping    PT Next Visit Plan  scift from wheelchair to warm up; continue to focus on self wheelchair propulsion, lateral scoot transfer to/from high/low table, standing at sink with good posture, standing in bars and ambulation in bars or try upright walker    PT Home Exercise Plan  LAQ, sitting marches, sitting hamstring curls, SLR, standing at kitchen sink    Consulted and Agree with Plan of Care  Patient;Family member/caregiver    Family Member Consulted  wife       Patient will benefit from skilled therapeutic intervention in order to improve the following deficits and impairments:  Decreased balance, Abnormal gait, Decreased endurance, Decreased range of motion, Decreased strength, Decreased mobility, Difficulty walking, Postural dysfunction, Pain  Visit Diagnosis: Other abnormalities of gait and mobility  Chronic pain of left knee  Muscle weakness (generalized)     Problem List Patient Active Problem List   Diagnosis Date Noted  . Bacteremia due to Enterococcus 05/24/2019  . Urinary tract infection 05/24/2019  . Chronic diastolic CHF (congestive heart failure) ([place]) 05/22/2019  . Gout 05/22/2019  . Sepsis ([place]) 05/22/2019  . Protein-calorie malnutrition, severe 05/18/2019  . AKI (acute kidney injury) ([place]) 05/05/2019  . Transient hypotension 05/05/2019  . Sepsis, Gram negative ([place]) 03/18/2019  . Hypoalbuminemia   . Leukocytosis   . Hyperglycemia   . Persistent atrial fibrillation with RVR ([place])   . Acute on chronic anemia   . Rupture of left patellar tendon 02/15/2019  .  Postoperative pain   . Bipolar 1 disorder ([place]) 02/14/2019  . Dementia without behavioral disturbance ([place]) 02/14/2019  . Patellar tendon rupture, left, initial encounter 02/10/2019  . SIRS (systemic  inflammatory response syndrome) ([place]) 02/09/2019  . Osteomyelitis ([place]) 02/09/2019  . Acute blood loss anemia 02/09/2019  . Aortic valve regurgitation 10/16/2018  . SBO (small bowel obstruction) ([place]) 07/08/2013  . Hyperlipidemia 12/20/2011  . HTN (hypertension) 06/04/2011  . Atrial fibrillation, chronic ([place]) 03/01/2011  . Long term (current) use of anticoagulants 03/01/2011  . GERD 10/11/2009  . DIVERTICULOSIS OF COLON 10/11/2009    Debbe Odea ,PT,DPT 09/22/2019, 6:15 PM  [facility] [address] [place], [place], [address] Phone: [phone]   Fax:  [phone]  Name: Russell Dillon MRN: [id number] Date of Birth: 11-May-1946

## 2019-09-23 ENCOUNTER — Ambulatory Visit: Payer: Medicare Other | Admitting: Physical Therapy

## 2019-09-23 ENCOUNTER — Other Ambulatory Visit: Payer: Self-pay

## 2019-09-23 DIAGNOSIS — G8929 Other chronic pain: Secondary | ICD-10-CM | POA: Diagnosis not present

## 2019-09-23 DIAGNOSIS — R2689 Other abnormalities of gait and mobility: Secondary | ICD-10-CM | POA: Diagnosis not present

## 2019-09-23 DIAGNOSIS — M25562 Pain in left knee: Secondary | ICD-10-CM | POA: Diagnosis not present

## 2019-09-23 DIAGNOSIS — M6281 Muscle weakness (generalized): Secondary | ICD-10-CM | POA: Diagnosis not present

## 2019-09-23 NOTE — Therapy (Signed)
Russell Dillon [address] [place], [place], [address] Phone: [phone]   Fax:  [phone]  Physical Therapy Treatment  Patient Details  Name: Russell Dillon MRN: [id number] Date of Birth: Dec 20, 1945 Referring Provider (PT): Marton Redwood, MD   Encounter Date: 09/23/2019  PT End of Session - 09/23/19 1549    Visit Number  22    Number of Visits  24    Date for PT Re-Evaluation  09/30/19    Authorization Type  needs KX, MCR, AARP, will need progress note visit 30    PT Start Time  1145    PT Stop Time  1230    PT Time Calculation (min)  45 min    Equipment Utilized During Treatment  Gait belt    Activity Tolerance  Patient tolerated treatment well    Behavior During Therapy  Flat affect;WFL for tasks assessed/performed       Past Medical History:  Diagnosis Date  . Acute metabolic encephalopathy 06/21/314  . Aortic valve regurgitation 10/16/2018   Echo 07/28/2017:  EF 60-65, moderate AI, aortic root and ascending aorta mildly dilated (40 mm), ascending aorta 43 mm, MAC, mild MR, mild LAE, moderate RV enlargement, trivial TR // Echo 12/19: EF 60-65, normal wall motion, mild AI, moderate BAE, ascending aorta 43 mm, aortic root 39 mm  . BPH (benign prostatic hyperplasia)   . BPH (benign prostatic hypertrophy)   . Chronic anticoagulation   . Chronic atrial fibrillation ([place])   . Diastolic dysfunction October 2010   Normal LV systolic function  . Hx SBO   . Hyperlipidemia   . Hypertension   . Patellar tendon rupture, left, initial encounter 02/10/2019  . Severe sepsis ([place]) 05/05/2019  . Small bowel obstruction [facility])     Past Surgical History:  Procedure Laterality Date  . APPENDECTOMY  2004  . APPLICATION OF WOUND VAC Left 02/11/2019   Procedure: Application Of Wound Vac;  Surgeon: Altamese [place], MD;  Location: [facility];  Service: Orthopedics;  Laterality: Left;  . CARDIOVASCULAR STRESS TEST  09/30/2007   EF  67%  No ischemia.   Marland Kitchen CARDIOVERSION  05/01/2004  . KNEE ARTHROSCOPY Left 02/11/2019   Procedure: ARTHROSCOPY LEFT KNEE;  Surgeon: Altamese Poole, MD;  Location: [place];  Service: Orthopedics;  Laterality: Left;  . LAPAROTOMY N/A 05/11/2019   Procedure: EXPLORATORY LAPAROTOMY;  Surgeon: Rolm Bookbinder, MD;  Location: [place];  Service: General;  Laterality: N/A;  . LYSIS OF ADHESION N/A 05/11/2019   Procedure: LYSIS OF ADHESION;  Surgeon: Rolm Bookbinder, MD;  Location: [place];  Service: General;  Laterality: N/A;  . PATELLAR TENDON REPAIR Left 02/11/2019   Procedure: LEFT PATELLA TENDON REPAIR;  Surgeon: Altamese [place], MD;  Location: [place] [facility];  Service: Orthopedics;  Laterality: Left;  . SMALL INTESTINE SURGERY  2004  . US ECHOCARDIOGRAPHY  09/05/2009   ef 55-60%    There were no vitals filed for this visit.  Subjective Assessment - 09/23/19 1536    Subjective  feels good today, no compaints, has been working on standing at home more at the kitchen sink    Pertinent History  PMH: recent hospitilization 05/22/19 atrial fibrillation, dementia, essential hypertension, HLD, diastolic congestive heart failure, aortic valve regurgitation, BPH, and recent small bowel obstruction with recent surgical intervention with lysis of adhesions    Limitations  Walking;Standing    How long can you stand comfortably?  can not stand on his own    How long  can you walk comfortably?  can not walk on his own    Patient Stated Goals  to walk again    Currently in Pain?  No/denies                       [facility] Adult PT Treatment/Exercise - 09/23/19 0001      Transfers   Sit to Stand Details (indicate cue type and reason)  sit to stand X 4 from wheelchair to upright walker with min A X2, need manual and tactile cues initially but did not need cuing on last rep      Ambulation/Gait   Gait Comments  ambulated with upwalker 10 ft, then 15 ft, then 20 ft, then finally 50 ft including one turn around to  his Lt. He was mod A +2 initially with tactile and verbal cues to reach forward to walker, to use momentum and to stand on 3, and to then transition hands to elbow rests and unlock breaks on walker. On the last rep he was able to peroform min A with only min cues for incrased step length. All was performed with close chair follow from his wife      Chief Technology Officer  Yes    Wheelchair Assistance  5: Supervision    Distance  115 X 2    Comments  minimal cues today for bigger revolution and steering      Knee/Hip Exercises: Aerobic   Other Aerobic  Scifit level 5.5 x12 minutes pt able to maintain a steady pace with no cues - performed from wheelchair with incline blocks behind wheels to prevent W/C from moving.                 PT Short Term Goals - 09/15/19 1503      PT SHORT TERM GOAL #1   Title  Pt will be I and compliant with initial HEP. (target for STG 6 weeks 10/9)    Baseline  wife says he is compliant with caregiver guidance    Status  Achieved      PT SHORT TERM GOAL #2   Title  Pt will be able to perform sit to stand with mod assist.    Baseline  partially met as he was able to perform this today in standing frame and at countertop,pulling from the sink, however not able to demonstrate fully funtional yet    Status  On-going        PT Long Term Goals - 09/15/19 1504      PT LONG TERM GOAL #1   Title  Pt will be I and compliant with final HEP. (target for all LTG 12 weeks 09/30/19)    Status  On-going      PT LONG TERM GOAL #2   Title  Pt will be able to perform sit to stand mod I.    Baseline  overall min to max, inconsistent    Status  On-going      PT LONG TERM GOAL #3   Title  Pt will be able perform stand pivot transfers min A to decrease caregiver burden.    Baseline  total, can now perform slideboard transfer min A to supervision    Status  On-going      PT LONG TERM GOAL #4   Title  Pt will be able to tolerate standing for 5  min with UE support.    Baseline  can not achieve upright,  progressing standing tolerance to around 30 sec at a time with UE support    Status  On-going      PT LONG TERM GOAL #5   Title  Pt will improve LE strength to at least 4/5 MMT bilat    Baseline  difficult to assess but at least 4/5    Status  Achieved            Plan - 09/23/19 1550    Clinical Impression Statement  He is showing great progress over the last 2 weeks. Today was able to progress from taking steps in parallel bars to ambulating with upwalker (used upwalker due to posterior lean he has in // bars). He still needs overall mod A +2 for safety and his wife was available for chair follow. He was able to show improved knee extension with gait and upwalker allows him to have more upright posture. PT will continue to progess as able.    Personal Factors and Comorbidities  Age;Comorbidity 1;Comorbidity 2;Comorbidity 3+;Past/Current Experience;Fitness    Comorbidities  PMH: recent hospitilization 05/22/19 atrial fibrillation, dementia, essential hypertension, HLD, diastolic congestive heart failure, aortic valve regurgitation, BPH, and recent small bowel obstruction with recent surgical intervention with lysis of adhesions    Examination-Activity Limitations  --   all ADL's   Examination-Participation Restrictions  --   All   Stability/Clinical Decision Making  Evolving/Moderate complexity    Rehab Potential  Fair    PT Frequency  Other (comment)   2-3   PT Duration  12 weeks    PT Treatment/Interventions  ADLs/Self Care Home Management;Aquatic Therapy;Cryotherapy;Moist Heat;DME Instruction;Gait training;Functional mobility training;Therapeutic activities;Therapeutic exercise;Balance training;Neuromuscular re-education;Manual techniques;Wheelchair mobility training;Patient/family education;Passive range of motion;Taping    PT Next Visit Plan  Raquel Sarna I will do his recert on Thursday when I see him so I will update measurments  and goals, continue to work on gait in upwalker, standing tolerance, and balance. as able    PT Home Exercise Plan  LAQ, sitting marches, sitting hamstring curls, SLR, standing at kitchen sink    Consulted and Agree with Plan of Care  Patient;Family member/caregiver    Family Member Consulted  wife       Patient will benefit from skilled therapeutic intervention in order to improve the following deficits and impairments:  Decreased balance, Abnormal gait, Decreased endurance, Decreased range of motion, Decreased strength, Decreased mobility, Difficulty walking, Postural dysfunction, Pain  Visit Diagnosis: Other abnormalities of gait and mobility  Chronic pain of left knee  Muscle weakness (generalized)     Problem List Patient Active Problem List   Diagnosis Date Noted  . Bacteremia due to Enterococcus 05/24/2019  . Urinary tract infection 05/24/2019  . Chronic diastolic CHF (congestive heart failure) ([place]) 05/22/2019  . Gout 05/22/2019  . Sepsis ([place]) 05/22/2019  . Protein-calorie malnutrition, severe 05/18/2019  . AKI (acute kidney injury) ([place]) 05/05/2019  . Transient hypotension 05/05/2019  . Sepsis, Gram negative ([place]) 03/18/2019  . Hypoalbuminemia   . Leukocytosis   . Hyperglycemia   . Persistent atrial fibrillation with RVR ([place])   . Acute on chronic anemia   . Rupture of left patellar tendon 02/15/2019  . Postoperative pain   . Bipolar 1 disorder ([place]) 02/14/2019  . Dementia without behavioral disturbance ([place]) 02/14/2019  . Patellar tendon rupture, left, initial encounter 02/10/2019  . SIRS (systemic inflammatory response syndrome) ([place]) 02/09/2019  . Osteomyelitis ([place]) 02/09/2019  . Acute blood loss anemia 02/09/2019  . Aortic valve regurgitation 10/16/2018  .  SBO (small bowel obstruction) ([place]) 07/08/2013  . Hyperlipidemia 12/20/2011  . HTN (hypertension) 06/04/2011  . Atrial fibrillation, chronic ([place]) 03/01/2011  . Long term (current) use of  anticoagulants 03/01/2011  . GERD 10/11/2009  . DIVERTICULOSIS OF COLON 10/11/2009    Silvestre Mesi 09/23/2019, 3:57 PM  [facility] [address] [place], [place], [address] Phone: [phone]   Fax:  [phone]  Name: MATTHEW CINA MRN: [id number] Date of Birth: 03-15-1946

## 2019-09-27 ENCOUNTER — Ambulatory Visit: Payer: Medicare Other

## 2019-09-27 ENCOUNTER — Other Ambulatory Visit: Payer: Self-pay

## 2019-09-27 DIAGNOSIS — R2689 Other abnormalities of gait and mobility: Secondary | ICD-10-CM

## 2019-09-27 DIAGNOSIS — M6281 Muscle weakness (generalized): Secondary | ICD-10-CM | POA: Diagnosis not present

## 2019-09-27 DIAGNOSIS — G8929 Other chronic pain: Secondary | ICD-10-CM | POA: Diagnosis not present

## 2019-09-27 DIAGNOSIS — M25562 Pain in left knee: Secondary | ICD-10-CM | POA: Diagnosis not present

## 2019-09-27 NOTE — Therapy (Signed)
[facility] [address] [place], [place], [address] Phone: [phone]   Fax:  [phone]  Physical Therapy Treatment  Patient Details  Name: IVERSON SEES MRN: [id number] Date of Birth: 09-Dec-1945 Referring Provider (PT): Marton Redwood, MD   Encounter Date: 09/27/2019  PT End of Session - 09/27/19 1514    Visit Number  23    Number of Visits  24    Date for PT Re-Evaluation  09/30/19    Authorization Type  needs KX, MCR, AARP, will need progress note visit 30    PT Start Time  1454    PT Stop Time  1535    PT Time Calculation (min)  41 min    Equipment Utilized During Treatment  Gait belt    Activity Tolerance  Patient tolerated treatment well    Behavior During Therapy  Flat affect;WFL for tasks assessed/performed       Past Medical History:  Diagnosis Date  . Acute metabolic encephalopathy 1/60/1093  . Aortic valve regurgitation 10/16/2018   Echo 07/28/2017:  EF 60-65, moderate AI, aortic root and ascending aorta mildly dilated (40 mm), ascending aorta 43 mm, MAC, mild MR, mild LAE, moderate RV enlargement, trivial TR // Echo 12/19: EF 60-65, normal wall motion, mild AI, moderate BAE, ascending aorta 43 mm, aortic root 39 mm  . BPH (benign prostatic hyperplasia)   . BPH (benign prostatic hypertrophy)   . Chronic anticoagulation   . Chronic atrial fibrillation ([place])   . Diastolic dysfunction October 2010   Normal LV systolic function  . Hx SBO   . Hyperlipidemia   . Hypertension   . Patellar tendon rupture, left, initial encounter 02/10/2019  . Severe sepsis ([place]) 05/05/2019  . Small bowel obstruction [facility])     Past Surgical History:  Procedure Laterality Date  . APPENDECTOMY  2004  . APPLICATION OF WOUND VAC Left 02/11/2019   Procedure: Application Of Wound Vac;  Surgeon: Altamese [place], MD;  Location: [facility];  Service: Orthopedics;  Laterality: Left;  . CARDIOVASCULAR STRESS TEST  09/30/2007   EF  67%  No ischemia.   Marland Kitchen CARDIOVERSION  05/01/2004  . KNEE ARTHROSCOPY Left 02/11/2019   Procedure: ARTHROSCOPY LEFT KNEE;  Surgeon: Altamese [place], MD;  Location: [facility];  Service: Orthopedics;  Laterality: Left;  . LAPAROTOMY N/A 05/11/2019   Procedure: EXPLORATORY LAPAROTOMY;  Surgeon: Rolm Bookbinder, MD;  Location: [facility];  Service: General;  Laterality: N/A;  . LYSIS OF ADHESION N/A 05/11/2019   Procedure: LYSIS OF ADHESION;  Surgeon: Rolm Bookbinder, MD;  Location: [facility];  Service: General;  Laterality: N/A;  . PATELLAR TENDON REPAIR Left 02/11/2019   Procedure: LEFT PATELLA TENDON REPAIR;  Surgeon: Altamese , MD;  Location: [facility];  Service: Orthopedics;  Laterality: Left;  . SMALL INTESTINE SURGERY  2004  . US ECHOCARDIOGRAPHY  09/05/2009   ef 55-60%    There were no vitals filed for this visit.  Subjective Assessment - 09/27/19 1513    Subjective  Wife reports transportation was running behind today. They have been working on standing at [employer] sink.    Pertinent History  PMH: recent hospitilization 05/22/19 atrial fibrillation, dementia, essential hypertension, HLD, diastolic congestive heart failure, aortic valve regurgitation, BPH, and recent small bowel obstruction with recent surgical intervention with lysis of adhesions    Limitations  Walking;Standing    How long can you stand comfortably?  can not stand on his own    How long can  you walk comfortably?  can not walk on his own    Patient Stated Goals  to walk again    Currently in Pain?  No/denies                       [facility] Adult PT Treatment/Exercise - 09/27/19 1515      Transfers   Transfers  Sit to Stand;Stand to Sit    Sit to Stand  4: Min guard;4: Min assist    Comments  Pt performed sit to stand multiple bouts to stand in // bars with verbal cuing to lean forward and allowed patient to pull on bars. Pt able to perform min assist to CGA.  Performed 3 reps in a row with cuing to sequencing.      Wheelchair Assistance Details (indicate cue type and reason)  Pt propelled w/c 100' with arms supervision with cuing to stay in center of hall more and occasional min assist to prevent bumping fingers.       Ambulation/Gait   Ambulation/Gait  Yes    Ambulation/Gait Assistance  4: Min assist   +2   Ambulation/Gait Assistance Details  Verbal cues to stand erect and try to tuck bottom more. Pt needed max assist for hand placement off brake with initial sit to stand at [employer] (Feet)  96 Feet    Assistive device  --   Upwalker   Gait Pattern  Step-through pattern;Decreased step length - right;Decreased step length - left;Right flexed knee in stance;Left flexed knee in stance;Trunk flexed    Ambulation Surface  Level;Indoor    Gait Comments  w/c follow      Neuro Re-ed    Neuro Re-ed Details   Pt stood in // bars x 20 sec, x 40 sec and x 50 sec CGA/min assist with verbal cues for erect posture and to bring weight forward.       Exercises   Exercises  Other Exercises    Other Exercises   Pt performed SciFit x 13 min level 5.5 from wheelchair with blocks behind to prevent slipping. Pt was given cues to try to sit more upright as tends to lean to the left.              PT Education - 09/27/19 1544    Education Details  Discussed with pt's wife to continue to work on sit to stand a sink at home.    Person(s) Educated  Patient;Spouse    Methods  Explanation    Comprehension  Verbalized understanding       PT Short Term Goals - 09/15/19 1503      PT SHORT TERM GOAL #1   Title  Pt will be I and compliant with initial HEP. (target for STG 6 weeks 10/9)    Baseline  wife says he is compliant with caregiver guidance    Status  Achieved      PT SHORT TERM GOAL #2   Title  Pt will be able to perform sit to stand with mod assist.    Baseline  partially met as he was able to perform this today in standing frame and at countertop,pulling from the sink, however not  able to demonstrate fully funtional yet    Status  On-going        PT Long Term Goals - 09/15/19 1504      PT LONG TERM GOAL #1   Title  Pt will be I  and compliant with final HEP. (target for all LTG 12 weeks 09/30/19)    Status  On-going      PT LONG TERM GOAL #2   Title  Pt will be able to perform sit to stand mod I.    Baseline  overall min to max, inconsistent    Status  On-going      PT LONG TERM GOAL #3   Title  Pt will be able perform stand pivot transfers min A to decrease caregiver burden.    Baseline  total, can now perform slideboard transfer min A to supervision    Status  On-going      PT LONG TERM GOAL #4   Title  Pt will be able to tolerate standing for 5 min with UE support.    Baseline  can not achieve upright, progressing standing tolerance to around 30 sec at a time with UE support    Status  On-going      PT LONG TERM GOAL #5   Title  Pt will improve LE strength to at least 4/5 MMT bilat    Baseline  difficult to assess but at least 4/5    Status  Achieved            Plan - 09/27/19 1545    Clinical Impression Statement  Pt showed improved sit to stand ability in // bars today with CGA/min assist to stand and able to get more upright. Pt further increased distance in UpWalker with decreased assistance today.    Personal Factors and Comorbidities  Age;Comorbidity 1;Comorbidity 2;Comorbidity 3+;Past/Current Experience;Fitness    Comorbidities  PMH: recent hospitilization 05/22/19 atrial fibrillation, dementia, essential hypertension, HLD, diastolic congestive heart failure, aortic valve regurgitation, BPH, and recent small bowel obstruction with recent surgical intervention with lysis of adhesions    Examination-Activity Limitations  --   all ADL's   Examination-Participation Restrictions  --   All   Stability/Clinical Decision Making  Evolving/Moderate complexity    Rehab Potential  Fair    PT Frequency  Other (comment)   2-3   PT Duration  12  weeks    PT Treatment/Interventions  ADLs/Self Care Home Management;Aquatic Therapy;Cryotherapy;Moist Heat;DME Instruction;Gait training;Functional mobility training;Therapeutic activities;Therapeutic exercise;Balance training;Neuromuscular re-education;Manual techniques;Wheelchair mobility training;Patient/family education;Passive range of motion;Taping    PT Next Visit Plan  Recert next visit, continue to work on gait in [employer], standing tolerance, and balance. as able    PT Home Exercise Plan  LAQ, sitting marches, sitting hamstring curls, SLR, standing at kitchen sink    Consulted and Agree with Plan of Care  Patient;Family member/caregiver    Family Member Consulted  wife       Patient will benefit from skilled therapeutic intervention in order to improve the following deficits and impairments:  Decreased balance, Abnormal gait, Decreased endurance, Decreased range of motion, Decreased strength, Decreased mobility, Difficulty walking, Postural dysfunction, Pain  Visit Diagnosis: Chronic pain of left knee  Muscle weakness (generalized)  Other abnormalities of gait and mobility     Problem List Patient Active Problem List   Diagnosis Date Noted  . Bacteremia due to Enterococcus 05/24/2019  . Urinary tract infection 05/24/2019  . Chronic diastolic CHF (congestive heart failure) ([place]) 05/22/2019  . Gout 05/22/2019  . Sepsis ([place]) 05/22/2019  . Protein-calorie malnutrition, severe 05/18/2019  . AKI (acute kidney injury) ([place]) 05/05/2019  . Transient hypotension 05/05/2019  . Sepsis, Gram negative ([place]) 03/18/2019  . Hypoalbuminemia   . Leukocytosis   . Hyperglycemia   .  Persistent atrial fibrillation with RVR ([place])   . Acute on chronic anemia   . Rupture of left patellar tendon 02/15/2019  . Postoperative pain   . Bipolar 1 disorder ([place]) 02/14/2019  . Dementia without behavioral disturbance ([place]) 02/14/2019  . Patellar tendon rupture, left, initial encounter 02/10/2019   . SIRS (systemic inflammatory response syndrome) ([place]) 02/09/2019  . Osteomyelitis ([place]) 02/09/2019  . Acute blood loss anemia 02/09/2019  . Aortic valve regurgitation 10/16/2018  . SBO (small bowel obstruction) ([place]) 07/08/2013  . Hyperlipidemia 12/20/2011  . HTN (hypertension) 06/04/2011  . Atrial fibrillation, chronic ([place]) 03/01/2011  . Long term (current) use of anticoagulants 03/01/2011  . GERD 10/11/2009  . DIVERTICULOSIS OF COLON 10/11/2009    Electa Sniff, PT, DPT, NCS 09/27/2019, 3:48 PM  [facility] [address] [place], [place], [address] Phone: [phone]   Fax:  [phone]  Name: CHEE KINSLOW MRN: [id number] Date of Birth: December 11, 1945

## 2019-09-29 ENCOUNTER — Ambulatory Visit: Payer: Medicare Other | Admitting: Physical Therapy

## 2019-09-30 ENCOUNTER — Ambulatory Visit: Payer: Medicare Other | Admitting: Physical Therapy

## 2019-09-30 ENCOUNTER — Other Ambulatory Visit: Payer: Self-pay

## 2019-09-30 DIAGNOSIS — M6281 Muscle weakness (generalized): Secondary | ICD-10-CM

## 2019-09-30 DIAGNOSIS — G8929 Other chronic pain: Secondary | ICD-10-CM | POA: Diagnosis not present

## 2019-09-30 DIAGNOSIS — R2689 Other abnormalities of gait and mobility: Secondary | ICD-10-CM | POA: Diagnosis not present

## 2019-09-30 DIAGNOSIS — M25562 Pain in left knee: Secondary | ICD-10-CM | POA: Diagnosis not present

## 2019-09-30 NOTE — Therapy (Signed)
[facility] [address] [place], [place], [address] Phone: [phone]   Fax:  [phone]  Physical Therapy Treatment/Recertification  Patient Details  Name: [place] MRN: [id number] Date of Birth: November 29, 1945 Referring Provider (PT): Marton Redwood, MD   Encounter Date: 09/30/2019  PT End of Session - 09/30/19 2157    Visit Number  24    Number of Visits  48   recert for 24 more visits due to making progress   Date for PT Re-Evaluation  58/85/02   recerted on 77/41 for 12 more weeks   Authorization Type  needs KX, MCR, AARP, will need progress note visit 30    PT Start Time  1150    PT Stop Time  1230    PT Time Calculation (min)  40 min    Equipment Utilized During Treatment  Gait belt    Activity Tolerance  Patient tolerated treatment well    Behavior During Therapy  Flat affect;WFL for tasks assessed/performed       Past Medical History:  Diagnosis Date  . Acute metabolic encephalopathy 2/87/8676  . Aortic valve regurgitation 10/16/2018   Echo 07/28/2017:  EF 60-65, moderate AI, aortic root and ascending aorta mildly dilated (40 mm), ascending aorta 43 mm, MAC, mild MR, mild LAE, moderate RV enlargement, trivial TR // Echo 12/19: EF 60-65, normal wall motion, mild AI, moderate BAE, ascending aorta 43 mm, aortic root 39 mm  . BPH (benign prostatic hyperplasia)   . BPH (benign prostatic hypertrophy)   . Chronic anticoagulation   . Chronic atrial fibrillation ([place])   . Diastolic dysfunction October 2010   Normal LV systolic function  . Hx SBO   . Hyperlipidemia   . Hypertension   . Patellar tendon rupture, left, initial encounter 02/10/2019  . Severe sepsis ([place]) 05/05/2019  . Small bowel obstruction [facility])     Past Surgical History:  Procedure Laterality Date  . APPENDECTOMY  2004  . APPLICATION OF WOUND VAC Left 02/11/2019   Procedure: Application Of Wound Vac;  Surgeon: Altamese [place], MD;   Location: [facility];  Service: Orthopedics;  Laterality: Left;  . CARDIOVASCULAR STRESS TEST  09/30/2007   EF 67%  No ischemia.   Marland Kitchen CARDIOVERSION  05/01/2004  . KNEE ARTHROSCOPY Left 02/11/2019   Procedure: ARTHROSCOPY LEFT KNEE;  Surgeon: Altamese [place], MD;  Location: [facility];  Service: Orthopedics;  Laterality: Left;  . LAPAROTOMY N/A 05/11/2019   Procedure: EXPLORATORY LAPAROTOMY;  Surgeon: Rolm Bookbinder, MD;  Location: [facility];  Service: General;  Laterality: N/A;  . LYSIS OF ADHESION N/A 05/11/2019   Procedure: LYSIS OF ADHESION;  Surgeon: Rolm Bookbinder, MD;  Location: [name];  Service: General;  Laterality: N/A;  . PATELLAR TENDON REPAIR Left 02/11/2019   Procedure: LEFT PATELLA TENDON REPAIR;  Surgeon: Altamese [place], MD;  Location: [facility];  Service: Orthopedics;  Laterality: Left;  . SMALL INTESTINE SURGERY  2004  . US ECHOCARDIOGRAPHY  09/05/2009   ef 55-60%    There were no vitals filed for this visit.  Subjective Assessment - 09/30/19 2146    Subjective  He reports he likes the upwalker, does not like using the rollator or RW because he feels more unstable    Pertinent History  PMH: recent hospitilization 05/22/19 atrial fibrillation, dementia, essential hypertension, HLD, diastolic congestive heart failure, aortic valve regurgitation, BPH, and recent small bowel obstruction with recent surgical intervention with lysis of adhesions    Limitations  Walking;Standing  How long can you stand comfortably?  can not stand on his own    How long can you walk comfortably?  can not walk on his own    Patient Stated Goals  to walk again    Currently in Pain?  No/denies         [facility] PT Assessment - 09/30/19 0001      Assessment   Medical Diagnosis  LE weakness, debility, balance and falls risk    Referring Provider (PT)  Marton Redwood, MD    Onset Date/Surgical Date  --   02/11/19, patella tendon repair surgery     ROM / Strength   AROM / PROM / Strength  Strength      Strength    Overall Strength Comments  hip strength grossly 4/5 MMT, knee strength grossly 4+/5 MMT, ankle DF grossly 4/5 MMT , all tested grossly in sitting      Transfers   Sit to Stand  4: Min guard;4: Min assist    Stand to Sit  With upper extremity assist;To bed;Uncontrolled descent;4: Min guard    Comments  5 reps total pefromed some reps with bilat arm push up from arm rests, and some reps one arm push up from arm rest and other on walker      Ambulation/Gait   Ambulation/Gait  Yes    Ambulation/Gait Assistance  4: Min assist    Ambulation/Gait Assistance Details  Verbal cues to stand erect and try to tuck bottom more. Pt needed max assist for hand placement off brake with initial sit to stand at [employer] (Feet)  115 Feet    Gait Pattern  Step-through pattern;Decreased step length - right;Decreased step length - left;Right flexed knee in stance;Left flexed knee in stance;Trunk flexed    Ambulation Surface  Level;Indoor                   [facility] Adult PT Treatment/Exercise - 09/30/19 0001      Ambulation/Gait   Gait Comments  w/c follow, able to show improved step length and gait speed today. Then trialed rollator and RW for 5 ft each but much more unsteady, not able to use his arms for proper weight bearing or keep device close enough to him, not able to fully extend his knee.      Exercises   Other Exercises   Pt performed SciFit x 10 min level 5.5 from wheelchair with blocks behind to prevent slipping.               PT Short Term Goals - 09/30/19 2157      PT SHORT TERM GOAL #1   Title  Pt will be I and compliant with initial HEP. (target for STG 6 weeks 10/9)    Baseline  wife says he is compliant with caregiver guidance    Status  Achieved      PT SHORT TERM GOAL #2   Title  Pt will be able to perform sit to stand with mod assist.    Baseline  able to show modified with assitive device or with using sink/counter top    Status  Achieved         PT Long Term Goals - 09/30/19 2158      PT LONG TERM GOAL #1   Title  Pt will be I and compliant with final HEP. (target for all LTG 12 weeks 12/23/19)    Status  On-going    Target  Date  12/23/19      PT LONG TERM GOAL #2   Title  Pt will be able to perform sit to stand mod I.    Baseline  overall min to mod, inconsistent and still needs UE support of AD or counter    Status  On-going      PT LONG TERM GOAL #3   Title  Pt will be able perform stand pivot transfers min A to decrease caregiver burden.    Baseline  not tested today but can perfrom lateral scoot transfer independently to min guard    Status  On-going      PT LONG TERM GOAL #4   Title  Pt will be able to tolerate standing for 5 min with UE support.    Baseline  progressing standing tolerance to around 1 min  with UE support    Status  On-going      PT LONG TERM GOAL #5   Title  Pt will improve LE strength to at least 4/5 MMT bilat    Baseline  difficult to assess due to cognitive deficits but at least 4/5    Status  Achieved      Additional Long Term Goals   Additional Long Term Goals  Yes      PT LONG TERM GOAL #6   Title  New goal created 09/30/19. He will be able to ambulate at least 300 ft for improved household ambulation, limited community ambulaiton with no more than min A LRAD.    Status  New            Plan - 09/30/19 2206    Clinical Impression Statement  Recert today to extend POC. He has made great progress in last 3 weeks. He has now met his short term goals and one of his LTG. He has progressed now to independent to min guard for lateral scoot transfers (was total A), progressed to min A for sit to stand (was total), has progressed his leg strength, standing tolerance, and walking abilities. He does still require min A for safety for amublation using upwalker due to continued leg weakness, poor balance, and gait instability. He will continue to benefit from skilled PT to address his  functional deficits, and to reduce care giver buden.    Personal Factors and Comorbidities  Age;Comorbidity 1;Comorbidity 2;Comorbidity 3+;Past/Current Experience;Fitness    Comorbidities  PMH: recent hospitilization 05/22/19 atrial fibrillation, dementia, essential hypertension, HLD, diastolic congestive heart failure, aortic valve regurgitation, BPH, and recent small bowel obstruction with recent surgical intervention with lysis of adhesions    Examination-Activity Limitations  --   all ADL's   Examination-Participation Restrictions  --   All   Stability/Clinical Decision Making  Evolving/Moderate complexity    Rehab Potential  Fair    PT Frequency  Other (comment)   2-3   PT Duration  12 weeks    PT Treatment/Interventions  ADLs/Self Care Home Management;Aquatic Therapy;Cryotherapy;Moist Heat;DME Instruction;Gait training;Functional mobility training;Therapeutic activities;Therapeutic exercise;Balance training;Neuromuscular re-education;Manual techniques;Wheelchair mobility training;Patient/family education;Passive range of motion;Taping    PT Next Visit Plan  update HEP to include begining standing exercises at sink maybe marches,weight shifting, balance, hip abd, mini squats????continue to work on gait in [employer], standing tolerance, and balance, eventually progress to step ups and stairs if able (his only shower is located upstairs)    PT Home Exercise Plan  LAQ, sitting marches, sitting hamstring curls, SLR, standing at kitchen sink    Consulted and Agree with Plan of  Care  Patient;Family member/caregiver    Family Member Consulted  wife       Patient will benefit from skilled therapeutic intervention in order to improve the following deficits and impairments:  Decreased balance, Abnormal gait, Decreased endurance, Decreased range of motion, Decreased strength, Decreased mobility, Difficulty walking, Postural dysfunction, Pain  Visit Diagnosis: Chronic pain of left knee  Muscle  weakness (generalized)  Other abnormalities of gait and mobility     Problem List Patient Active Problem List   Diagnosis Date Noted  . Bacteremia due to Enterococcus 05/24/2019  . Urinary tract infection 05/24/2019  . Chronic diastolic CHF (congestive heart failure) ([place]) 05/22/2019  . Gout 05/22/2019  . Sepsis ([place]) 05/22/2019  . Protein-calorie malnutrition, severe 05/18/2019  . AKI (acute kidney injury) ([place]) 05/05/2019  . Transient hypotension 05/05/2019  . Sepsis, Gram negative ([place]) 03/18/2019  . Hypoalbuminemia   . Leukocytosis   . Hyperglycemia   . Persistent atrial fibrillation with RVR ([place])   . Acute on chronic anemia   . Rupture of left patellar tendon 02/15/2019  . Postoperative pain   . Bipolar 1 disorder ([place]) 02/14/2019  . Dementia without behavioral disturbance ([place]) 02/14/2019  . Patellar tendon rupture, left, initial encounter 02/10/2019  . SIRS (systemic inflammatory response syndrome) ([place]) 02/09/2019  . Osteomyelitis ([place]) 02/09/2019  . Acute blood loss anemia 02/09/2019  . Aortic valve regurgitation 10/16/2018  . SBO (small bowel obstruction) ([place]) 07/08/2013  . Hyperlipidemia 12/20/2011  . HTN (hypertension) 06/04/2011  . Atrial fibrillation, chronic ([place]) 03/01/2011  . Long term (current) use of anticoagulants 03/01/2011  . GERD 10/11/2009  . DIVERTICULOSIS OF COLON 10/11/2009    Silvestre Mesi 09/30/2019, 10:15 PM  [facility] [address] [place], [place], [address] Phone: [phone]   Fax:  [phone]  Name: [place] MRN: [id number] Date of Birth: [birth year]

## 2019-10-04 ENCOUNTER — Other Ambulatory Visit: Payer: Medicare Other | Admitting: Licensed Clinical Social Worker

## 2019-10-04 DIAGNOSIS — Z515 Encounter for palliative care: Secondary | ICD-10-CM

## 2019-10-05 ENCOUNTER — Other Ambulatory Visit: Payer: Self-pay

## 2019-10-05 ENCOUNTER — Ambulatory Visit: Payer: Medicare Other | Admitting: Physical Therapy

## 2019-10-05 DIAGNOSIS — M25562 Pain in left knee: Secondary | ICD-10-CM | POA: Diagnosis not present

## 2019-10-05 DIAGNOSIS — M6281 Muscle weakness (generalized): Secondary | ICD-10-CM

## 2019-10-05 DIAGNOSIS — R2689 Other abnormalities of gait and mobility: Secondary | ICD-10-CM | POA: Diagnosis not present

## 2019-10-05 DIAGNOSIS — G8929 Other chronic pain: Secondary | ICD-10-CM | POA: Diagnosis not present

## 2019-10-05 NOTE — Therapy (Addendum)
[facility] [address] [place], [place], [address] Phone: [phone]   Fax:  [phone]  Physical Therapy Treatment  Patient Details  Name: Russell Dillon MRN: [id number] Date of Birth: 08/05/1946 Referring Provider (PT): Marton Redwood, MD   Encounter Date: 10/05/2019  PT End of Session - 10/05/19 1610    Visit Number  25    Number of Visits  48   recert for 24 more visits due to making progress   Date for PT Re-Evaluation  25/00/37   recerted on 04/88 for 12 more weeks   Authorization Type  needs KX, MCR, AARP, will need progress note visit 30    PT Start Time  1020    PT Stop Time  1105    PT Time Calculation (min)  45 min    Equipment Utilized During Treatment  Gait belt    Activity Tolerance  Patient tolerated treatment well    Behavior During Therapy  Flat affect;WFL for tasks assessed/performed       Past Medical History:  Diagnosis Date  . Acute metabolic encephalopathy 8/91/6945  . Aortic valve regurgitation 10/16/2018   Echo 07/28/2017:  EF 60-65, moderate AI, aortic root and ascending aorta mildly dilated (40 mm), ascending aorta 43 mm, MAC, mild MR, mild LAE, moderate RV enlargement, trivial TR // Echo 12/19: EF 60-65, normal wall motion, mild AI, moderate BAE, ascending aorta 43 mm, aortic root 39 mm  . BPH (benign prostatic hyperplasia)   . BPH (benign prostatic hypertrophy)   . Chronic anticoagulation   . Chronic atrial fibrillation ([place])   . Diastolic dysfunction October 2010   Normal LV systolic function  . Hx SBO   . Hyperlipidemia   . Hypertension   . Patellar tendon rupture, left, initial encounter 02/10/2019  . Severe sepsis ([place]) 05/05/2019  . Small bowel obstruction [facility])     Past Surgical History:  Procedure Laterality Date  . APPENDECTOMY  2004  . APPLICATION OF WOUND VAC Left 02/11/2019   Procedure: Application Of Wound Vac;  Surgeon: Altamese [place], MD;  Location: [facility];   Service: Orthopedics;  Laterality: Left;  . CARDIOVASCULAR STRESS TEST  09/30/2007   EF 67%  No ischemia.   Marland Kitchen CARDIOVERSION  05/01/2004  . KNEE ARTHROSCOPY Left 02/11/2019   Procedure: ARTHROSCOPY LEFT KNEE;  Surgeon: Altamese [place], MD;  Location: [facility];  Service: Orthopedics;  Laterality: Left;  . LAPAROTOMY N/A 05/11/2019   Procedure: EXPLORATORY LAPAROTOMY;  Surgeon: Rolm Bookbinder, MD;  Location: [facility];  Service: General;  Laterality: N/A;  . LYSIS OF ADHESION N/A 05/11/2019   Procedure: LYSIS OF ADHESION;  Surgeon: Rolm Bookbinder, MD;  Location: [facility];  Service: General;  Laterality: N/A;  . PATELLAR TENDON REPAIR Left 02/11/2019   Procedure: LEFT PATELLA TENDON REPAIR;  Surgeon: Altamese Amity, MD;  Location: [facility];  Service: Orthopedics;  Laterality: Left;  . SMALL INTESTINE SURGERY  2004  . US ECHOCARDIOGRAPHY  09/05/2009   ef 55-60%    There were no vitals filed for this visit.  Subjective Assessment - 10/05/19 1602    Subjective  no complaints today, wife is thankful we were able to get him in today for a PT appointment off the waitlist.    Pertinent History  PMH: recent hospitilization 05/22/19 atrial fibrillation, dementia, essential hypertension, HLD, diastolic congestive heart failure, aortic valve regurgitation, BPH, and recent small bowel obstruction with recent surgical intervention with lysis of adhesions    Limitations  Walking;Standing    How long can you stand comfortably?  can not stand on his own    How long can you walk comfortably?  can not walk on his own    Patient Stated Goals  to walk again          [facility] Adult PT Treatment/Exercise - 10/05/19 0001      Transfers   Sit to Stand  4: Min guard;4: Min assist    Sit to Stand Details (indicate cue type and reason)  X5 reps during session, with UE support either of upwalker or at stair rails      Ambulation/Gait   Ambulation/Gait  Yes    Ambulation/Gait Assistance  4: Min assist    Ambulation/Gait  Assistance Details  Verbal cues to stand erect and try to tuck bottom more. Pt needed max assist for hand placement off brake with initial sit to stand at [employer] (Feet)  --   115 ft in begining, then 57f at the end   Assistive device  --   upwalker   Gait Pattern  Step-through pattern;Decreased step length - right;Decreased step length - left;Right flexed knee in stance;Left flexed knee in stance;Trunk flexed    Gait Comments  w/c follow, able to show improved step length and gait speed today.      WBaristaAssistance  5: SPatent attorney Both upper extremities    Distance  115 X 2      Exercises   Other Exercises   Pt performed SciFit x 10 min level 6 from wheelchair with blocks behind to prevent slipping. Then attempted step ups at 6 inch stairs, able to complete one rep on Rt leg with mod A to help clear Lt leg, then attempted one rep on Lt leg but unable to shift weight off his Rt leg to pick up left leg then became too fatiqued.           PT Short Term Goals - 09/30/19 2157      PT SHORT TERM GOAL #1   Title  Pt will be I and compliant with  HEP revision. (target for STG 4weeks 11/04/19)   Baseline  was compliant with initial HEPwith caregiver guidance but needs progression to more standing activity with UE support   Status  Achieved      PT SHORT TERM GOAL #2   Title  Pt will be able to perform sit to stand with min assist using AD.   Baseline  able to show modified with assitive device or with using sink/counter top    Status  Achieved        PT Long Term Goals - 09/30/19 2158      PT LONG TERM GOAL #1   Title  Pt will be I and compliant with final HEP. (target for all LTG 12 weeks 12/23/19)    Status  On-going    Target Date  12/23/19      PT LONG TERM GOAL #2   Title  Pt will be able to perform sit to stand mod I.    Baseline  overall min to mod, inconsistent and  still needs UE support of AD or counter    Status  On-going      PT LONG TERM GOAL #3   Title  Pt will be able perform stand pivot transfers min A to decrease caregiver  burden.    Baseline  not tested today but can perfrom lateral scoot transfer independently to min guard    Status  On-going      PT LONG TERM GOAL #4   Title  Pt will be able to tolerate standing for 5 min with UE support.    Baseline  progressing standing tolerance to around 1 min  with UE support    Status  On-going      PT LONG TERM GOAL #5   Title  Pt will improve LE strength to at least 4/5 MMT bilat    Baseline  difficult to assess due to cognitive deficits but at least 4/5    Status  Achieved      Additional Long Term Goals   Additional Long Term Goals  Yes      PT LONG TERM GOAL #6   Title  New goal created 09/30/19. He will be able to ambulate at least 300 ft for improved household ambulation, limited community ambulaiton with no more than min A LRAD.    Status  New            Plan - 10/05/19 1611    Clinical Impression Statement  Progressed with ambulation tolerance again using upwalker and is showing larger stridelength and improved posture. Attempted step ups today on 6 inch step but limited by fear and fatique. He may be better with step ups on small step and at sink/countertop where he is more familiar.    Personal Factors and Comorbidities  Age;Comorbidity 1;Comorbidity 2;Comorbidity 3+;Past/Current Experience;Fitness    Comorbidities  PMH: recent hospitilization 05/22/19 atrial fibrillation, dementia, essential hypertension, HLD, diastolic congestive heart failure, aortic valve regurgitation, BPH, and recent small bowel obstruction with recent surgical intervention with lysis of adhesions    Examination-Activity Limitations  --   all ADL's   Examination-Participation Restrictions  --   All   Stability/Clinical Decision Making  Evolving/Moderate complexity    Rehab Potential  Fair    PT  Frequency  Other (comment)   2-3   PT Duration  12 weeks    PT Treatment/Interventions  ADLs/Self Care Home Management;Aquatic Therapy;Cryotherapy;Moist Heat;DME Instruction;Gait training;Functional mobility training;Therapeutic activities;Therapeutic exercise;Balance training;Neuromuscular re-education;Manual techniques;Wheelchair mobility training;Patient/family education;Passive range of motion;Taping    PT Next Visit Plan  standing exercises at sink maybe marches,weight shifting, balance, hip abd, mini squats????continue to work on gait in [employer], standing tolerance, and balance, eventually progress to step ups and stairs if able (his only shower is located upstairs)    PT Home Exercise Plan  LAQ, sitting marches, sitting hamstring curls, SLR, standing at kitchen sink    Consulted and Agree with Plan of Care  Patient;Family member/caregiver    Family Member Consulted  wife       Patient will benefit from skilled therapeutic intervention in order to improve the following deficits and impairments:  Decreased balance, Abnormal gait, Decreased endurance, Decreased range of motion, Decreased strength, Decreased mobility, Difficulty walking, Postural dysfunction, Pain  Visit Diagnosis: Chronic pain of left knee  Muscle weakness (generalized)  Other abnormalities of gait and mobility     Problem List Patient Active Problem List   Diagnosis Date Noted  . Bacteremia due to Enterococcus 05/24/2019  . Urinary tract infection 05/24/2019  . Chronic diastolic CHF (congestive heart failure) ([place]) 05/22/2019  . Gout 05/22/2019  . Sepsis ([place]) 05/22/2019  . Protein-calorie malnutrition, severe 05/18/2019  . AKI (acute kidney injury) ([place]) 05/05/2019  . Transient hypotension 05/05/2019  . Sepsis,  Gram negative ([place]) 03/18/2019  . Hypoalbuminemia   . Leukocytosis   . Hyperglycemia   . Persistent atrial fibrillation with RVR ([place])   . Acute on chronic anemia   . Rupture of left patellar  tendon 02/15/2019  . Postoperative pain   . Bipolar 1 disorder ([place]) 02/14/2019  . Dementia without behavioral disturbance ([place]) 02/14/2019  . Patellar tendon rupture, left, initial encounter 02/10/2019  . SIRS (systemic inflammatory response syndrome) ([place]) 02/09/2019  . Osteomyelitis ([place]) 02/09/2019  . Acute blood loss anemia 02/09/2019  . Aortic valve regurgitation 10/16/2018  . SBO (small bowel obstruction) ([place]) 07/08/2013  . Hyperlipidemia 12/20/2011  . HTN (hypertension) 06/04/2011  . Atrial fibrillation, chronic ([place]) 03/01/2011  . Long term (current) use of anticoagulants 03/01/2011  . GERD 10/11/2009  . DIVERTICULOSIS OF COLON 10/11/2009    Silvestre Mesi 10/05/2019, 4:14 PM  Addendum created 10/15/19 to adjust date for short term goals. Elsie Ra, PT, DPT 10/15/19 1:58 PM  [facility] [address] [place], [place], [address] Phone: [phone]   Fax:  [phone]  Name: Russell Dillon MRN: [id number] Date of Birth: 1946-08-23

## 2019-10-05 NOTE — Progress Notes (Signed)
COMMUNITY PALLIATIVE CARE SW NOTE  PATIENT NAME: Russell Dillon DOB: 1946-06-16 MRN: [id number]  PRIMARY CARE PROVIDER: Marton Redwood, MD  RESPONSIBLE PARTY:  Acct ID - Guarantor Home Phone Work Phone Relationship Acct Type  [id number] Ibaad Khoury,* [phone]  Self P/F     [place], [place], [place] [address]   Due to the COVID-19 crisis, this virtual check-in visit was done via telephone from my office and it was initiated and consent given by thispatientand or family.  PLAN OF CARE and INTERVENTIONS:             1. GOALS OF CARE/ ADVANCE CARE PLANNING:  Wife's goal is for patient to have the best life possible.  He is a full code. 2. SOCIAL/EMOTIONAL/SPIRITUAL ASSESSMENT/ INTERVENTIONS:  SW conducted a Sales executive visit with patient's wife, [name], in her home.  She stated patient continues getting stronger and is receiving PT.  She has caregivers that assist her with having patient participating in exercises and using a walker at home.  He also has a Civil Service fast streamer.  SW provided active listening and supportive counseling. 3. PATIENT/CAREGIVER EDUCATION/ COPING:  Wife copes by problem-solving.  She also relies greatly on her faith. 4. PERSONAL EMERGENCY PLAN:  She will contact EMS. 5. COMMUNITY RESOURCES COORDINATION/ HEALTH CARE NAVIGATION:  Patient has private caregivers. 6. FINANCIAL/LEGAL CONCERNS/INTERVENTIONS:  None.     SOCIAL HX:  Social History   Tobacco Use  . Smoking status: Never Smoker  . Smokeless tobacco: Never Used  Substance Use Topics  . Alcohol use: No    Alcohol/week: 0.0 standard drinks    CODE STATUS:  Full Code ADVANCED DIRECTIVES: No MOST FORM COMPLETE: No HOSPICE EDUCATION PROVIDED: No PPS:  Appetite is normal.  He can stand with assistance. Duration of visit and documentation:  30 minutes.     Creola Corn Philipp Callegari, LCSW

## 2019-10-06 ENCOUNTER — Encounter

## 2019-10-13 ENCOUNTER — Ambulatory Visit: Payer: Medicare Other | Attending: Internal Medicine | Admitting: Physical Therapy

## 2019-10-13 ENCOUNTER — Other Ambulatory Visit: Payer: Self-pay

## 2019-10-13 ENCOUNTER — Encounter: Payer: Self-pay | Admitting: Physical Therapy

## 2019-10-13 DIAGNOSIS — M6281 Muscle weakness (generalized): Secondary | ICD-10-CM | POA: Insufficient documentation

## 2019-10-13 DIAGNOSIS — G8929 Other chronic pain: Secondary | ICD-10-CM | POA: Insufficient documentation

## 2019-10-13 DIAGNOSIS — R2689 Other abnormalities of gait and mobility: Secondary | ICD-10-CM | POA: Diagnosis not present

## 2019-10-13 DIAGNOSIS — M25562 Pain in left knee: Secondary | ICD-10-CM | POA: Diagnosis not present

## 2019-10-14 NOTE — Therapy (Signed)
[facility] [address] [place], [place], [address] Phone: [phone]   Fax:  [phone]  Physical Therapy Treatment  Patient Details  Name: Russell Dillon MRN: [id number] Date of Birth: 10-20-1946 Referring Provider (PT): Marton Redwood, MD   Encounter Date: 10/13/2019  PT End of Session - 10/13/19 1401    Visit Number  26    Number of Visits  48   recert for 24 more visits due to making progress   Date for PT Re-Evaluation  69/62/95   recerted on 28/41 for 12 more weeks   Authorization Type  needs KX, MCR, AARP, will need progress note visit 30    PT Start Time  1358    PT Stop Time  1448    PT Time Calculation (min)  50 min    Equipment Utilized During Treatment  Gait belt    Activity Tolerance  Patient tolerated treatment well    Behavior During Therapy  Flat affect;WFL for tasks assessed/performed       Past Medical History:  Diagnosis Date  . Acute metabolic encephalopathy 02/01/4009  . Aortic valve regurgitation 10/16/2018   Echo 07/28/2017:  EF 60-65, moderate AI, aortic root and ascending aorta mildly dilated (40 mm), ascending aorta 43 mm, MAC, mild MR, mild LAE, moderate RV enlargement, trivial TR // Echo 12/19: EF 60-65, normal wall motion, mild AI, moderate BAE, ascending aorta 43 mm, aortic root 39 mm  . BPH (benign prostatic hyperplasia)   . BPH (benign prostatic hypertrophy)   . Chronic anticoagulation   . Chronic atrial fibrillation ([place])   . Diastolic dysfunction October 2010   Normal LV systolic function  . Hx SBO   . Hyperlipidemia   . Hypertension   . Patellar tendon rupture, left, initial encounter 02/10/2019  . Severe sepsis ([place]) 05/05/2019  . Small bowel obstruction [facility])     Past Surgical History:  Procedure Laterality Date  . APPENDECTOMY  2004  . APPLICATION OF WOUND VAC Left 02/11/2019   Procedure: Application Of Wound Vac;  Surgeon: Altamese Dalton, MD;  Location: [facility];   Service: Orthopedics;  Laterality: Left;  . CARDIOVASCULAR STRESS TEST  09/30/2007   EF 67%  No ischemia.   Marland Kitchen CARDIOVERSION  05/01/2004  . KNEE ARTHROSCOPY Left 02/11/2019   Procedure: ARTHROSCOPY LEFT KNEE;  Surgeon: Altamese [place], MD;  Location: [place];  Service: Orthopedics;  Laterality: Left;  . LAPAROTOMY N/A 05/11/2019   Procedure: EXPLORATORY LAPAROTOMY;  Surgeon: Rolm Bookbinder, MD;  Location: [place];  Service: General;  Laterality: N/A;  . LYSIS OF ADHESION N/A 05/11/2019   Procedure: LYSIS OF ADHESION;  Surgeon: Rolm Bookbinder, MD;  Location: [facility];  Service: General;  Laterality: N/A;  . PATELLAR TENDON REPAIR Left 02/11/2019   Procedure: LEFT PATELLA TENDON REPAIR;  Surgeon: Altamese [place], MD;  Location: [facility];  Service: Orthopedics;  Laterality: Left;  . SMALL INTESTINE SURGERY  2004  . US ECHOCARDIOGRAPHY  09/05/2009   ef 55-60%    There were no vitals filed for this visit.  Subjective Assessment - 10/13/19 1359    Subjective  Spouse reports now using the slide board more for into/out of bed when caregivers are there. She still uses the lift if by herself. Standing at the sink multiple times a day, several reps each time.    Pertinent History  PMH: recent hospitilization 05/22/19 atrial fibrillation, dementia, essential hypertension, HLD, diastolic congestive heart failure, aortic valve regurgitation, BPH, and recent small  bowel obstruction with recent surgical intervention with lysis of adhesions    Limitations  Walking;Standing    How long can you stand comfortably?  can not stand on his own    How long can you walk comfortably?  can not walk on his own    Patient Stated Goals  to walk again    Currently in Pain?  No/denies    Pain Score  0-No pain             [facility] Adult PT Treatment/Exercise - 10/13/19 1402      Transfers   Transfers  Sit to Stand;Stand to Sit    Sit to Stand  4: Min guard;4: Min assist;With upper extremity assist;From chair/3-in-1    Sit to  Stand Details  Tactile cues for posture;Tactile cues for weight beaing;Verbal cues for sequencing;Verbal cues for precautions/safety;Verbal cues for safe use of DME/AE;Manual facilitation for weight shifting    Stand to Sit  4: Min guard;With upper extremity assist;To chair/3-in-1    Stand to Sit Details (indicate cue type and reason)  Verbal cues for technique;Verbal cues for safe use of DME/AE;Verbal cues for precautions/safety;Manual facilitation for weight shifting    Comments  sit<>stand transfers x 4 reps at sink with cues as stated.  Worked on upright posture, alteranting UE raises, mini squats and alternating marching for varied reps each one (until pt needed to sit to rest). min assist for balance with cues/facilitation for posture.       Ambulation/Gait   Ambulation/Gait  Yes    Ambulation/Gait Assistance  4: Min assist    Ambulation/Gait Assistance Details  cues for posture with gait. had pt hold the handle only, not brake of Up Walker with PTA controlling speed with improved gait technique noted.     Ambulation Distance (Feet)  30 Feet   x1, 75 x1   Assistive device  Other (Comment)   upright walker   Gait Pattern  Step-through pattern;Decreased step length - right;Decreased step length - left;Right flexed knee in stance;Left flexed knee in stance;Trunk flexed    Ambulation Surface  Level;Indoor      Knee/Hip Exercises: Aerobic   Other Aerobic  UE/LE level 6 for 8 minutes with goal of 30 rpm, cues needed to maintain this pace. assist needed to stabilize wheelchair from tipping back with blocks behind wheels.           PT Short Term Goals - 09/30/19 2157      PT SHORT TERM GOAL #1   Title  Pt will be I and compliant with initial HEP. (target for STG 6 weeks 10/9)    Baseline  wife says he is compliant with caregiver guidance    Status  Achieved      PT SHORT TERM GOAL #2   Title  Pt will be able to perform sit to stand with mod assist.    Baseline  able to show modified  with assitive device or with using sink/counter top    Status  Achieved        PT Long Term Goals - 09/30/19 2158      PT LONG TERM GOAL #1   Title  Pt will be I and compliant with final HEP. (target for all LTG 12 weeks 12/23/19)    Status  On-going    Target Date  12/23/19      PT LONG TERM GOAL #2   Title  Pt will be able to perform sit to stand mod I.  Baseline  overall min to mod, inconsistent and still needs UE support of AD or counter    Status  On-going      PT LONG TERM GOAL #3   Title  Pt will be able perform stand pivot transfers min A to decrease caregiver burden.    Baseline  not tested today but can perfrom lateral scoot transfer independently to min guard    Status  On-going      PT LONG TERM GOAL #4   Title  Pt will be able to tolerate standing for 5 min with UE support.    Baseline  progressing standing tolerance to around 1 min  with UE support    Status  On-going      PT LONG TERM GOAL #5   Title  Pt will improve LE strength to at least 4/5 MMT bilat    Baseline  difficult to assess due to cognitive deficits but at least 4/5    Status  Achieved      Additional Long Term Goals   Additional Long Term Goals  Yes      PT LONG TERM GOAL #6   Title  New goal created 09/30/19. He will be able to ambulate at least 300 ft for improved household ambulation, limited community ambulaiton with no more than min A LRAD.    Status  New            Plan - 10/13/19 1402    Clinical Impression Statement  Today's skilled session continued to address strengthening, sit<>stand transfers and gait with up walker. The pt fatigued with standing ex's and was not able to ambulate as far this session. Did educated spouse on ex's he can start doing at the sink at home when he stands. The pt is progressing toward goals and should benefit from continued PT to progress toward unmet goals.    Personal Factors and Comorbidities  Age;Comorbidity 1;Comorbidity 2;Comorbidity  3+;Past/Current Experience;Fitness    Comorbidities  PMH: recent hospitilization 05/22/19 atrial fibrillation, dementia, essential hypertension, HLD, diastolic congestive heart failure, aortic valve regurgitation, BPH, and recent small bowel obstruction with recent surgical intervention with lysis of adhesions    Examination-Activity Limitations  --   all ADL's   Examination-Participation Restrictions  --   All   Stability/Clinical Decision Making  Evolving/Moderate complexity    Rehab Potential  Fair    PT Frequency  Other (comment)   2-3   PT Duration  12 weeks    PT Treatment/Interventions  ADLs/Self Care Home Management;Aquatic Therapy;Cryotherapy;Moist Heat;DME Instruction;Gait training;Functional mobility training;Therapeutic activities;Therapeutic exercise;Balance training;Neuromuscular re-education;Manual techniques;Wheelchair mobility training;Patient/family education;Passive range of motion;Taping    PT Next Visit Plan  continue to work on ex's at sink with standing; continue to work on gait in [employer], standing tolerance, and balance, eventually progress to step ups and stairs if able (his only shower is located upstairs)    PT Home Exercise Plan  LAQ, sitting marches, sitting hamstring curls, SLR, standing at kitchen sink    Consulted and Agree with Plan of Care  Patient;Family member/caregiver    Family Member Consulted  wife       Patient will benefit from skilled therapeutic intervention in order to improve the following deficits and impairments:  Decreased balance, Abnormal gait, Decreased endurance, Decreased range of motion, Decreased strength, Decreased mobility, Difficulty walking, Postural dysfunction, Pain  Visit Diagnosis: Chronic pain of left knee  Muscle weakness (generalized)  Other abnormalities of gait and mobility     Problem  List Patient Active Problem List   Diagnosis Date Noted  . Bacteremia due to Enterococcus 05/24/2019  . Urinary tract infection  05/24/2019  . Chronic diastolic CHF (congestive heart failure) ([place]) 05/22/2019  . Gout 05/22/2019  . Sepsis ([place]) 05/22/2019  . Protein-calorie malnutrition, severe 05/18/2019  . AKI (acute kidney injury) ([place]) 05/05/2019  . Transient hypotension 05/05/2019  . Sepsis, Gram negative ([place]) 03/18/2019  . Hypoalbuminemia   . Leukocytosis   . Hyperglycemia   . Persistent atrial fibrillation with RVR ([place])   . Acute on chronic anemia   . Rupture of left patellar tendon 02/15/2019  . Postoperative pain   . Bipolar 1 disorder ([place]) 02/14/2019  . Dementia without behavioral disturbance ([place]) 02/14/2019  . Patellar tendon rupture, left, initial encounter 02/10/2019  . SIRS (systemic inflammatory response syndrome) ([place]) 02/09/2019  . Osteomyelitis ([place]) 02/09/2019  . Acute blood loss anemia 02/09/2019  . Aortic valve regurgitation 10/16/2018  . SBO (small bowel obstruction) ([place]) 07/08/2013  . Hyperlipidemia 12/20/2011  . HTN (hypertension) 06/04/2011  . Atrial fibrillation, chronic ([place]) 03/01/2011  . Long term (current) use of anticoagulants 03/01/2011  . GERD 10/11/2009  . DIVERTICULOSIS OF COLON 10/11/2009    Willow Ora, PTA, [facility] [address], [place], [place] [address] [phone] 10/14/19, 3:56 PM   Name: EATHON VALADE MRN: [id number] Date of Birth: [birth year]

## 2019-10-15 ENCOUNTER — Ambulatory Visit: Payer: Medicare Other | Admitting: Physical Therapy

## 2019-10-15 ENCOUNTER — Encounter: Payer: Self-pay | Admitting: Physical Therapy

## 2019-10-15 ENCOUNTER — Other Ambulatory Visit: Payer: Self-pay

## 2019-10-15 DIAGNOSIS — I480 Paroxysmal atrial fibrillation: Secondary | ICD-10-CM | POA: Diagnosis not present

## 2019-10-15 DIAGNOSIS — R2689 Other abnormalities of gait and mobility: Secondary | ICD-10-CM | POA: Diagnosis not present

## 2019-10-15 DIAGNOSIS — Z7901 Long term (current) use of anticoagulants: Secondary | ICD-10-CM | POA: Diagnosis not present

## 2019-10-15 DIAGNOSIS — M25562 Pain in left knee: Secondary | ICD-10-CM | POA: Diagnosis not present

## 2019-10-15 DIAGNOSIS — G8929 Other chronic pain: Secondary | ICD-10-CM | POA: Diagnosis not present

## 2019-10-15 DIAGNOSIS — M6281 Muscle weakness (generalized): Secondary | ICD-10-CM | POA: Diagnosis not present

## 2019-10-16 NOTE — Therapy (Signed)
[facility] [address] [place], [place], [address] Phone: [phone]   Fax:  [phone]  Physical Therapy Treatment  Patient Details  Name: Russell Dillon MRN: [id number] Date of Birth: Dec 05, 1945 Referring Provider (PT): Marton Redwood, MD   Encounter Date: 10/15/2019  PT End of Session - 10/15/19 1404    Visit Number  27    Number of Visits  48   recert for 24 more visits due to making progress   Date for PT Re-Evaluation  71/69/67   recerted on 89/38 for 12 more weeks   Authorization Type  needs KX, MCR, AARP, will need progress note visit 30    PT Start Time  1400    PT Stop Time  1438   pt done after stairs, stated "I cant do anymore"   PT Time Calculation (min)  38 min    Equipment Utilized During Treatment  Gait belt    Activity Tolerance  Patient tolerated treatment well    Behavior During Therapy  Flat affect;WFL for tasks assessed/performed       Past Medical History:  Diagnosis Date  . Acute metabolic encephalopathy 11/11/7508  . Aortic valve regurgitation 10/16/2018   Echo 07/28/2017:  EF 60-65, moderate AI, aortic root and ascending aorta mildly dilated (40 mm), ascending aorta 43 mm, MAC, mild MR, mild LAE, moderate RV enlargement, trivial TR // Echo 12/19: EF 60-65, normal wall motion, mild AI, moderate BAE, ascending aorta 43 mm, aortic root 39 mm  . BPH (benign prostatic hyperplasia)   . BPH (benign prostatic hypertrophy)   . Chronic anticoagulation   . Chronic atrial fibrillation ([place])   . Diastolic dysfunction October 2010   Normal LV systolic function  . Hx SBO   . Hyperlipidemia   . Hypertension   . Patellar tendon rupture, left, initial encounter 02/10/2019  . Severe sepsis ([place]) 05/05/2019  . Small bowel obstruction [facility])     Past Surgical History:  Procedure Laterality Date  . APPENDECTOMY  2004  . APPLICATION OF WOUND VAC Left 02/11/2019   Procedure: Application Of Wound Vac;   Surgeon: Altamese Endeavor, MD;  Location: [facility];  Service: Orthopedics;  Laterality: Left;  . CARDIOVASCULAR STRESS TEST  09/30/2007   EF 67%  No ischemia.   Marland Kitchen CARDIOVERSION  05/01/2004  . KNEE ARTHROSCOPY Left 02/11/2019   Procedure: ARTHROSCOPY LEFT KNEE;  Surgeon: Altamese Claryville, MD;  Location: [facility];  Service: Orthopedics;  Laterality: Left;  . LAPAROTOMY N/A 05/11/2019   Procedure: EXPLORATORY LAPAROTOMY;  Surgeon: Rolm Bookbinder, MD;  Location: [facility];  Service: General;  Laterality: N/A;  . LYSIS OF ADHESION N/A 05/11/2019   Procedure: LYSIS OF ADHESION;  Surgeon: Rolm Bookbinder, MD;  Location: [facility];  Service: General;  Laterality: N/A;  . PATELLAR TENDON REPAIR Left 02/11/2019   Procedure: LEFT PATELLA TENDON REPAIR;  Surgeon: Altamese Culebra, MD;  Location: [facility];  Service: Orthopedics;  Laterality: Left;  . SMALL INTESTINE SURGERY  2004  . US ECHOCARDIOGRAPHY  09/05/2009   ef 55-60%    There were no vitals filed for this visit.  Subjective Assessment - 10/15/19 1403    Subjective  No new complaitns. Spouse reports they are working on the standing ex's at the sink since last session.    Pertinent History  PMH: recent hospitilization 05/22/19 atrial fibrillation, dementia, essential hypertension, HLD, diastolic congestive heart failure, aortic valve regurgitation, BPH, and recent small bowel obstruction with recent surgical intervention with lysis  of adhesions    Limitations  Walking;Standing    How long can you stand comfortably?  can not stand on his own    How long can you walk comfortably?  can not walk on his own    Patient Stated Goals  to walk again    Currently in Pain?  No/denies    Pain Score  0-No pain            [facility] Adult PT Treatment/Exercise - 10/15/19 1405      Transfers   Transfers  Sit to Stand;Stand to Sit    Sit to Stand  4: Min guard;4: Min assist;With upper extremity assist;From chair/3-in-1    Stand to Sit  4: Min guard;With upper extremity  assist;To chair/3-in-1      Ambulation/Gait   Ambulation/Gait  Yes    Stairs  Yes    Stairs Assistance  2: Max assist;Other (comment)   max assist of 2 people   Stairs Assistance Details (indicate cue type and reason)  stood pt to steps to perform foot taps to bottom steps to prepare for stair negotiation at a later date. Once standing at steps pt began to try to climb them. with cues performed foot taps. after rest break pt stood again to stairs and this time ascended the bottom 2 leading with right foot up, left foot down (pt descending backwards as not safe to turn). after a seated rest break pt stood again to stairs with bil UE support with goal to attempt bottom 2 stairs. this time pt began to climb the stairs with 2 person max assist. at top pt rested on stool. descending required 2 person max assist as pt began to descend stairs prior to fully standing from stool. pt with significant posterior lean and flexed knees with descending. once at bottom of stairs max assist of 2 to pivot to sit in wheelchair.                          Stair Management Technique  Two rails;Step to pattern;Forwards    Number of Stairs  2   x1, 4 x1   Height of Stairs  6      Exercises   Exercises  Other Exercises    Other Exercises   standing at bottom of steps with bil UE support: attempted foot taps to bottom step. pt's left knee buckling in stance, unable to tap right foot. able to perform up to 10 taps with left LE.                            Knee/Hip Exercises: Aerobic   Other Aerobic  UE/LE level 6 for 8 minutes with goal of 30 rpm, cues needed to maintain this pace. assist needed to stabilize wheelchair from tipping back with blocks behind wheels.           PT Short Term Goals - 10/15/19 1353      PT SHORT TERM GOAL #1   Title  Pt will be I and compliant with  HEP revision. (target for STG 4weeks 11/04/19)    Baseline  was compliant with initial HEP, needs HEP progression for more standing activity  at sink    Status  On-going      PT SHORT TERM GOAL #2   Title  Pt will be able to perform sit to stand with min assist with AD.  Baseline  able to show modified with assitive device or with using sink/counter top    Status  Achieved        PT Long Term Goals - 09/30/19 2158      PT LONG TERM GOAL #1   Title  Pt will be I and compliant with final HEP. (target for all LTG 12 weeks 12/23/19)    Status  On-going    Target Date  12/23/19      PT LONG TERM GOAL #2   Title  Pt will be able to perform sit to stand mod I.    Baseline  overall min to mod, inconsistent and still needs UE support of AD or counter    Status  On-going      PT LONG TERM GOAL #3   Title  Pt will be able perform stand pivot transfers min A to decrease caregiver burden.    Baseline  not tested today but can perfrom lateral scoot transfer independently to min guard    Status  On-going      PT LONG TERM GOAL #4   Title  Pt will be able to tolerate standing for 5 min with UE support.    Baseline  progressing standing tolerance to around 1 min  with UE support    Status  On-going      PT LONG TERM GOAL #5   Title  Pt will improve LE strength to at least 4/5 MMT bilat    Baseline  difficult to assess due to cognitive deficits but at least 4/5    Status  Achieved      Additional Long Term Goals   Additional Long Term Goals  Yes      PT LONG TERM GOAL #6   Title  New goal created 09/30/19. He will be able to ambulate at least 300 ft for improved household ambulation, limited community ambulaiton with no more than min A LRAD.    Status  New            Plan - 10/15/19 1404    Clinical Impression Statement  Today's skilled session continued to focus on LE strengthening with cues needed to maintain pace on [place]. Remainder of session initially focused on standing balance/strengthening at stairs and quickly progressed to stair training as pt impulsively began to climb the stairs. Needed max assist x 2 for  safety with a 3rd person who brought stool over and moved wheelchair. The pt will need additional training before stairs are functional. The pt is progressing and should benefit from continued PT to progress toward unmet goals.    Personal Factors and Comorbidities  Age;Comorbidity 1;Comorbidity 2;Comorbidity 3+;Past/Current Experience;Fitness    Comorbidities  PMH: recent hospitilization 05/22/19 atrial fibrillation, dementia, essential hypertension, HLD, diastolic congestive heart failure, aortic valve regurgitation, BPH, and recent small bowel obstruction with recent surgical intervention with lysis of adhesions    Examination-Activity Limitations  --   all ADL's   Examination-Participation Restrictions  --   All   Stability/Clinical Decision Making  Evolving/Moderate complexity    Rehab Potential  Fair    PT Frequency  Other (comment)   2-3   PT Duration  12 weeks    PT Treatment/Interventions  ADLs/Self Care Home Management;Aquatic Therapy;Cryotherapy;Moist Heat;DME Instruction;Gait training;Functional mobility training;Therapeutic activities;Therapeutic exercise;Balance training;Neuromuscular re-education;Manual techniques;Wheelchair mobility training;Patient/family education;Passive range of motion;Taping    PT Next Visit Plan  continue to work on ex's at sink with standing; continue to work on gait in [place],  standing tolerance, and balance, eventually progress to step ups and stairs if able (his only shower is located upstairs)    PT Home Exercise Plan  LAQ, sitting marches, sitting hamstring curls, SLR, standing at kitchen sink    Consulted and Agree with Plan of Care  Patient;Family member/caregiver    Family Member Consulted  wife       Patient will benefit from skilled therapeutic intervention in order to improve the following deficits and impairments:  Decreased balance, Abnormal gait, Decreased endurance, Decreased range of motion, Decreased strength, Decreased mobility,  Difficulty walking, Postural dysfunction, Pain  Visit Diagnosis: Muscle weakness (generalized)  Other abnormalities of gait and mobility     Problem List Patient Active Problem List   Diagnosis Date Noted  . Bacteremia due to Enterococcus 05/24/2019  . Urinary tract infection 05/24/2019  . Chronic diastolic CHF (congestive heart failure) ([place]) 05/22/2019  . Gout 05/22/2019  . Sepsis ([place]) 05/22/2019  . Protein-calorie malnutrition, severe 05/18/2019  . AKI (acute kidney injury) ([place]) 05/05/2019  . Transient hypotension 05/05/2019  . Sepsis, Gram negative ([place]) 03/18/2019  . Hypoalbuminemia   . Leukocytosis   . Hyperglycemia   . Persistent atrial fibrillation with RVR ([place])   . Acute on chronic anemia   . Rupture of left patellar tendon 02/15/2019  . Postoperative pain   . Bipolar 1 disorder ([place]) 02/14/2019  . Dementia without behavioral disturbance ([place]) 02/14/2019  . Patellar tendon rupture, left, initial encounter 02/10/2019  . SIRS (systemic inflammatory response syndrome) ([place]) 02/09/2019  . Osteomyelitis ([place]) 02/09/2019  . Acute blood loss anemia 02/09/2019  . Aortic valve regurgitation 10/16/2018  . SBO (small bowel obstruction) ([place]) 07/08/2013  . Hyperlipidemia 12/20/2011  . HTN (hypertension) 06/04/2011  . Atrial fibrillation, chronic ([place]) 03/01/2011  . Long term (current) use of anticoagulants 03/01/2011  . GERD 10/11/2009  . DIVERTICULOSIS OF COLON 10/11/2009    Willow Ora, PTA, [facility] [address], [place], [place] [address] [phone] 10/16/19, 2:03 AM   Name: Russell Dillon MRN: [id number] Date of Birth: [birth year]

## 2019-10-20 ENCOUNTER — Ambulatory Visit: Payer: Medicare Other | Admitting: Physical Therapy

## 2019-10-20 ENCOUNTER — Other Ambulatory Visit: Payer: Self-pay

## 2019-10-20 ENCOUNTER — Encounter: Payer: Self-pay | Admitting: Physical Therapy

## 2019-10-20 DIAGNOSIS — R2689 Other abnormalities of gait and mobility: Secondary | ICD-10-CM | POA: Diagnosis not present

## 2019-10-20 DIAGNOSIS — M6281 Muscle weakness (generalized): Secondary | ICD-10-CM

## 2019-10-20 DIAGNOSIS — M25562 Pain in left knee: Secondary | ICD-10-CM

## 2019-10-20 DIAGNOSIS — G8929 Other chronic pain: Secondary | ICD-10-CM

## 2019-10-21 NOTE — Therapy (Signed)
[facility] [address] [place], [place], [address] Phone: [phone]   Fax:  [phone]  Physical Therapy Treatment  Patient Details  Name: Russell Dillon MRN: [id number] Date of Birth: 1946/03/30 Referring Provider (PT): Marton Redwood, MD   Encounter Date: 10/20/2019  PT End of Session - 10/20/19 1325    Visit Number  28    Number of Visits  48   recert for 24 more visits due to making progress   Date for PT Re-Evaluation  09/38/18   recerted on 29/93 for 12 more weeks   Authorization Type  needs KX, MCR, AARP, will need progress note visit 30    PT Start Time  1320    PT Stop Time  1400    PT Time Calculation (min)  40 min    Equipment Utilized During Treatment  Gait belt    Activity Tolerance  Patient tolerated treatment well;No increased pain    Behavior During Therapy  WFL for tasks assessed/performed       Past Medical History:  Diagnosis Date  . Acute metabolic encephalopathy 05/26/9677  . Aortic valve regurgitation 10/16/2018   Echo 07/28/2017:  EF 60-65, moderate AI, aortic root and ascending aorta mildly dilated (40 mm), ascending aorta 43 mm, MAC, mild MR, mild LAE, moderate RV enlargement, trivial TR // Echo 12/19: EF 60-65, normal wall motion, mild AI, moderate BAE, ascending aorta 43 mm, aortic root 39 mm  . BPH (benign prostatic hyperplasia)   . BPH (benign prostatic hypertrophy)   . Chronic anticoagulation   . Chronic atrial fibrillation ([place])   . Diastolic dysfunction October 2010   Normal LV systolic function  . Hx SBO   . Hyperlipidemia   . Hypertension   . Patellar tendon rupture, left, initial encounter 02/10/2019  . Severe sepsis ([place]) 05/05/2019  . Small bowel obstruction [facility])     Past Surgical History:  Procedure Laterality Date  . APPENDECTOMY  2004  . APPLICATION OF WOUND VAC Left 02/11/2019   Procedure: Application Of Wound Vac;  Surgeon: Altamese Riverton, MD;  Location: [facility];   Service: Orthopedics;  Laterality: Left;  . CARDIOVASCULAR STRESS TEST  09/30/2007   EF 67%  No ischemia.   Marland Kitchen CARDIOVERSION  05/01/2004  . KNEE ARTHROSCOPY Left 02/11/2019   Procedure: ARTHROSCOPY LEFT KNEE;  Surgeon: Altamese [place], MD;  Location: [facility];  Service: Orthopedics;  Laterality: Left;  . LAPAROTOMY N/A 05/11/2019   Procedure: EXPLORATORY LAPAROTOMY;  Surgeon: Rolm Bookbinder, MD;  Location: [facility];  Service: General;  Laterality: N/A;  . LYSIS OF ADHESION N/A 05/11/2019   Procedure: LYSIS OF ADHESION;  Surgeon: Rolm Bookbinder, MD;  Location: [facility];  Service: General;  Laterality: N/A;  . PATELLAR TENDON REPAIR Left 02/11/2019   Procedure: LEFT PATELLA TENDON REPAIR;  Surgeon: Altamese [place], MD;  Location: [facility];  Service: Orthopedics;  Laterality: Left;  . SMALL INTESTINE SURGERY  2004  . US ECHOCARDIOGRAPHY  09/05/2009   ef 55-60%    There were no vitals filed for this visit.  Subjective Assessment - 10/20/19 1324    Subjective  No new complaints.  No falls. Did have his Warfarin adjusted to "thin" his blood, spouse unable to recall his INR level. Will be checked again in a month.    Pertinent History  PMH: recent hospitilization 05/22/19 atrial fibrillation, dementia, essential hypertension, HLD, diastolic congestive heart failure, aortic valve regurgitation, BPH, and recent small bowel obstruction with recent surgical intervention  with lysis of adhesions    How long can you stand comfortably?  can not stand on his own    How long can you walk comfortably?  can not walk on his own    Patient Stated Goals  to walk again    Currently in Pain?  No/denies    Pain Score  0-No pain         10/20/19 1325  Transfers  Transfers Sit to Stand;Stand to Sit  Sit to Stand 4: Min guard;4: Min assist;With upper extremity assist;From chair/3-in-1  Sit to Stand Details Verbal cues for sequencing;Verbal cues for technique;Verbal cues for safe use of DME/AE;Manual facilitation for weight  shifting  Stand to Sit 4: Min guard;With upper extremity assist;To chair/3-in-1  Stand to Sit Details (indicate cue type and reason) Verbal cues for sequencing;Verbal cues for technique;Verbal cues for safe use of DME/AE;Verbal cues for precautions/safety  Comments multiple stands performed in parallel bars and then at sink.   Wheelchair Assistance Details (indicate cue type and reason) Pt able to self propell around gym with occasional assist to steer/navigate around objects  Exercises  Exercises Other Exercises  Other Exercises  standing in parallel bars: working on forward foot taps to 4 inch box, with pt self progressing to stepping up onto box/back down, then pt steppping up and over box without warning as he was stating "how about I just step over it". min to max assist (most assist needed when pt stepped over as he needed to sit and chair had to be brought to him. Returned to just step ups/back down after this. Pt performed multiple reps of each with frequent rest breaks taken (about every 3-4 reps); standing at sink: worked on lateral stepping out/in, alternating LE's for ~10 reps each side, marching in place for ~10 reps each side and alternating UE reaching to upper cabinets for ~10 reps each side. Cues on ex form/technique needed with min guard to min assist. Rest breaks between ex's needed due to fatigue.    Knee/Hip Exercises: Aerobic  Other Aerobic UE/LE level 6 for 8 minutes with goal of 30 rpm, cues needed to maintain this pace. assist needed to stabilize wheelchair from tipping back with blocks behind wheels.          PT Short Term Goals - 10/15/19 1353      PT SHORT TERM GOAL #1   Title  Pt will be I and compliant with  HEP revision. (target for STG 4weeks 11/04/19)    Baseline  was compliant with initial HEP, needs HEP progression for more standing activity at sink    Status  On-going      PT SHORT TERM GOAL #2   Title  Pt will be able to perform sit to stand with min  assist with AD.    Baseline  able to show modified with assitive device or with using sink/counter top    Status  Achieved        PT Long Term Goals - 09/30/19 2158      PT LONG TERM GOAL #1   Title  Pt will be I and compliant with final HEP. (target for all LTG 12 weeks 12/23/19)    Status  On-going    Target Date  12/23/19      PT LONG TERM GOAL #2   Title  Pt will be able to perform sit to stand mod I.    Baseline  overall min to mod, inconsistent and still needs UE  support of AD or counter    Status  On-going      PT LONG TERM GOAL #3   Title  Pt will be able perform stand pivot transfers min A to decrease caregiver burden.    Baseline  not tested today but can perfrom lateral scoot transfer independently to min guard    Status  On-going      PT LONG TERM GOAL #4   Title  Pt will be able to tolerate standing for 5 min with UE support.    Baseline  progressing standing tolerance to around 1 min  with UE support    Status  On-going      PT LONG TERM GOAL #5   Title  Pt will improve LE strength to at least 4/5 MMT bilat    Baseline  difficult to assess due to cognitive deficits but at least 4/5    Status  Achieved      Additional Long Term Goals   Additional Long Term Goals  Yes      PT LONG TERM GOAL #6   Title  New goal created 09/30/19. He will be able to ambulate at least 300 ft for improved household ambulation, limited community ambulaiton with no more than min A LRAD.    Status  New          10/20/19 1325  Plan  Clinical Impression Statement Today's skilled session continued to focus on LE strengthening, transfers and standing balance/posture. The pt remains motivated and paticipatory until fatigued, then states "I believe that's all I got today". The pt is progressing toward goals and should benefit from continued PT to progress toward unmet goals.  Personal Factors and Comorbidities Age;Comorbidity 1;Comorbidity 2;Comorbidity 3+;Past/Current Experience;Fitness   Comorbidities PMH: recent hospitilization 05/22/19 atrial fibrillation, dementia, essential hypertension, HLD, diastolic congestive heart failure, aortic valve regurgitation, BPH, and recent small bowel obstruction with recent surgical intervention with lysis of adhesions  Examination-Activity Limitations  (all ADL's)  Examination-Participation Restrictions  (All)  Pt will benefit from skilled therapeutic intervention in order to improve on the following deficits Decreased balance;Abnormal gait;Decreased endurance;Decreased range of motion;Decreased strength;Decreased mobility;Difficulty walking;Postural dysfunction;Pain  Stability/Clinical Decision Making Evolving/Moderate complexity  Rehab Potential Fair  PT Frequency Other (comment) (2-3)  PT Duration 12 weeks  PT Treatment/Interventions ADLs/Self Care Home Management;Aquatic Therapy;Cryotherapy;Moist Heat;DME Instruction;Gait training;Functional mobility training;Therapeutic activities;Therapeutic exercise;Balance training;Neuromuscular re-education;Manual techniques;Wheelchair mobility training;Patient/family education;Passive range of motion;Taping  PT Next Visit Plan continue to work on ex's at sink with standing; continue to work on gait in [employer], standing tolerance, and balance, eventually progress to step ups and stairs if able (his only shower is located upstairs)  PT Home Exercise Plan LAQ, sitting marches, sitting hamstring curls, SLR, standing at kitchen sink- UE reaching, marching in place, mini squats  Consulted and Agree with Plan of Care Patient;Family member/caregiver  Family Member Consulted wife         Patient will benefit from skilled therapeutic intervention in order to improve the following deficits and impairments:  Decreased balance, Abnormal gait, Decreased endurance, Decreased range of motion, Decreased strength, Decreased mobility, Difficulty walking, Postural dysfunction, Pain  Visit Diagnosis: Muscle  weakness (generalized)  Other abnormalities of gait and mobility  Chronic pain of left knee     Problem List Patient Active Problem List   Diagnosis Date Noted  . Bacteremia due to Enterococcus 05/24/2019  . Urinary tract infection 05/24/2019  . Chronic diastolic CHF (congestive heart failure) ([place]) 05/22/2019  . Gout 05/22/2019  .  Sepsis ([place]) 05/22/2019  . Protein-calorie malnutrition, severe 05/18/2019  . AKI (acute kidney injury) ([place]) 05/05/2019  . Transient hypotension 05/05/2019  . Sepsis, Gram negative ([place]) 03/18/2019  . Hypoalbuminemia   . Leukocytosis   . Hyperglycemia   . Persistent atrial fibrillation with RVR ([place])   . Acute on chronic anemia   . Rupture of left patellar tendon 02/15/2019  . Postoperative pain   . Bipolar 1 disorder ([place]) 02/14/2019  . Dementia without behavioral disturbance ([place]) 02/14/2019  . Patellar tendon rupture, left, initial encounter 02/10/2019  . SIRS (systemic inflammatory response syndrome) ([place]) 02/09/2019  . Osteomyelitis ([place]) 02/09/2019  . Acute blood loss anemia 02/09/2019  . Aortic valve regurgitation 10/16/2018  . SBO (small bowel obstruction) ([place]) 07/08/2013  . Hyperlipidemia 12/20/2011  . HTN (hypertension) 06/04/2011  . Atrial fibrillation, chronic ([place]) 03/01/2011  . Long term (current) use of anticoagulants 03/01/2011  . GERD 10/11/2009  . DIVERTICULOSIS OF COLON 10/11/2009    Willow Ora, PTA, [facility] [address], [place], [place] [address] [phone] 10/22/19, 7:30 AM  Name: Russell Dillon MRN: [id number] Date of Birth: 1946/05/24

## 2019-10-22 ENCOUNTER — Other Ambulatory Visit: Payer: Self-pay

## 2019-10-22 ENCOUNTER — Ambulatory Visit: Payer: Medicare Other | Admitting: Physical Therapy

## 2019-10-22 ENCOUNTER — Encounter: Payer: Self-pay | Admitting: Physical Therapy

## 2019-10-22 DIAGNOSIS — G8929 Other chronic pain: Secondary | ICD-10-CM | POA: Diagnosis not present

## 2019-10-22 DIAGNOSIS — M6281 Muscle weakness (generalized): Secondary | ICD-10-CM

## 2019-10-22 DIAGNOSIS — M25562 Pain in left knee: Secondary | ICD-10-CM | POA: Diagnosis not present

## 2019-10-22 DIAGNOSIS — R2689 Other abnormalities of gait and mobility: Secondary | ICD-10-CM

## 2019-10-23 NOTE — Therapy (Signed)
[facility] [address] [place], [place], [address] Phone: [phone]   Fax:  [phone]  Physical Therapy Treatment  Patient Details  Name: Russell Dillon MRN: [id number] Date of Birth: 1946-11-04 Referring Provider (PT): Marton Redwood, MD   Encounter Date: 10/22/2019     10/22/19 1320  PT Visits / Re-Eval  Visit Number 29  Number of Visits 75 (recert for 24 more visits due to making progress)  Date for PT Re-Evaluation 62/37/62 (recerted on 83/15 for 12 more weeks)  Authorization  Authorization Type needs KX, MCR, AARP, will need progress note visit 30  PT Time Calculation  PT Start Time 1316  PT Stop Time 1354  PT Time Calculation (min) 38 min  PT - End of Session  Equipment Utilized During Treatment Gait belt  Activity Tolerance Patient tolerated treatment well;No increased pain  Behavior During Therapy WFL for tasks assessed/performed    Past Medical History:  Diagnosis Date  . Acute metabolic encephalopathy 1/76/1607  . Aortic valve regurgitation 10/16/2018   Echo 07/28/2017:  EF 60-65, moderate AI, aortic root and ascending aorta mildly dilated (40 mm), ascending aorta 43 mm, MAC, mild MR, mild LAE, moderate RV enlargement, trivial TR // Echo 12/19: EF 60-65, normal wall motion, mild AI, moderate BAE, ascending aorta 43 mm, aortic root 39 mm  . BPH (benign prostatic hyperplasia)   . BPH (benign prostatic hypertrophy)   . Chronic anticoagulation   . Chronic atrial fibrillation ([place])   . Diastolic dysfunction October 2010   Normal LV systolic function  . Hx SBO   . Hyperlipidemia   . Hypertension   . Patellar tendon rupture, left, initial encounter 02/10/2019  . Severe sepsis ([facility] [place]) 05/05/2019  . Small bowel obstruction [facility])     Past Surgical History:  Procedure Laterality Date  . APPENDECTOMY  2004  . APPLICATION OF WOUND VAC Left 02/11/2019   Procedure: Application Of Wound Vac;  Surgeon:  Altamese Roswell, MD;  Location: [facility];  Service: Orthopedics;  Laterality: Left;  . CARDIOVASCULAR STRESS TEST  09/30/2007   EF 67%  No ischemia.   Marland Kitchen CARDIOVERSION  05/01/2004  . KNEE ARTHROSCOPY Left 02/11/2019   Procedure: ARTHROSCOPY LEFT KNEE;  Surgeon: Altamese Grace, MD;  Location: [facility];  Service: Orthopedics;  Laterality: Left;  . LAPAROTOMY N/A 05/11/2019   Procedure: EXPLORATORY LAPAROTOMY;  Surgeon: Rolm Bookbinder, MD;  Location: [facility];  Service: General;  Laterality: N/A;  . LYSIS OF ADHESION N/A 05/11/2019   Procedure: LYSIS OF ADHESION;  Surgeon: Rolm Bookbinder, MD;  Location: [facility];  Service: General;  Laterality: N/A;  . PATELLAR TENDON REPAIR Left 02/11/2019   Procedure: LEFT PATELLA TENDON REPAIR;  Surgeon: Altamese Walker, MD;  Location: [facility];  Service: Orthopedics;  Laterality: Left;  . SMALL INTESTINE SURGERY  2004  . US ECHOCARDIOGRAPHY  09/05/2009   ef 55-60%    There were no vitals filed for this visit.     10/22/19 1319  Symptoms/Limitations  Subjective No new complaitns. No falls or pain to report.  Pertinent History PMH: recent hospitilization 05/22/19 atrial fibrillation, dementia, essential hypertension, HLD, diastolic congestive heart failure, aortic valve regurgitation, BPH, and recent small bowel obstruction with recent surgical intervention with lysis of adhesions  Limitations Walking;Standing  How long can you stand comfortably? can not stand on his own  How long can you walk comfortably? can not walk on his own  Patient Stated Goals to walk again  Pain Assessment  Currently in Pain? No/denies      10/22/19 1321  Transfers  Transfers Sit to Stand;Stand to Sit  Sit to Stand 4: Min guard;4: Min assist;With upper extremity assist;From chair/3-in-1  Sit to Stand Details Verbal cues for sequencing;Verbal cues for technique;Verbal cues for safe use of DME/AE;Manual facilitation for weight shifting  Stand to Sit 4: Min guard;With upper extremity  assist;To chair/3-in-1  Stand to Sit Details (indicate cue type and reason) Verbal cues for sequencing;Verbal cues for technique;Verbal cues for safe use of DME/AE;Verbal cues for precautions/safety  Comments x2 reps to outside of parallel bars, then 2 reps to RW  Ambulation/Gait  Ambulation/Gait Yes  Ambulation/Gait Assistance 3: Mod assist (2cd person for safety with [name] bringing chair)  Ambulation/Gait Assistance Details cues/facilitaiton for upright posture, walker steering/negotiation  Ambulation Distance (Feet) 50 Feet (x1, 115 x1)  Assistive device Rolling walker  Gait Pattern Step-through pattern;Decreased step length - right;Decreased step length - left;Right flexed knee in stance;Left flexed knee in stance;Trunk flexed  Ambulation Surface Level;Indoor  High Level Balance  High Level Balance Activities Side stepping  High Level Balance Comments outside the bars- 2 reps down/back, with seated rest break between laps. min assist for balance with cues on form and technique.   Knee/Hip Exercises: Aerobic  Other Aerobic UE/LE level 6 for 8 minutes with goal of 30 rpm, cues needed to maintain this pace. assist needed to stabilize wheelchair from tipping back with blocks behind wheels.          PT Short Term Goals - 10/15/19 1353      PT SHORT TERM GOAL #1   Title  Pt will be I and compliant with  HEP revision. (target for STG 4weeks 11/04/19)    Baseline  was compliant with initial HEP, needs HEP progression for more standing activity at sink    Status  On-going      PT SHORT TERM GOAL #2   Title  Pt will be able to perform sit to stand with min assist with AD.    Baseline  able to show modified with assitive device or with using sink/counter top    Status  Achieved        PT Long Term Goals - 09/30/19 2158      PT LONG TERM GOAL #1   Title  Pt will be I and compliant with final HEP. (target for all LTG 12 weeks 12/23/19)    Status  On-going    Target Date  12/23/19       PT LONG TERM GOAL #2   Title  Pt will be able to perform sit to stand mod I.    Baseline  overall min to mod, inconsistent and still needs UE support of AD or counter    Status  On-going      PT LONG TERM GOAL #3   Title  Pt will be able perform stand pivot transfers min A to decrease caregiver burden.    Baseline  not tested today but can perfrom lateral scoot transfer independently to min guard    Status  On-going      PT LONG TERM GOAL #4   Title  Pt will be able to tolerate standing for 5 min with UE support.    Baseline  progressing standing tolerance to around 1 min  with UE support    Status  On-going      PT LONG TERM GOAL #5   Title  Pt will improve LE strength  to at least 4/5 MMT bilat    Baseline  difficult to assess due to cognitive deficits but at least 4/5    Status  Achieved      Additional Long Term Goals   Additional Long Term Goals  Yes      PT LONG TERM GOAL #6   Title  New goal created 09/30/19. He will be able to ambulate at least 300 ft for improved household ambulation, limited community ambulaiton with no more than min A LRAD.    Status  New         10/22/19 1320  Plan  Clinical Impression Statement Today's skilled session continued to focus on strengthening and initiated gait with RW today. The pt is progressing well toward goals and should benefit from continued PT to progress toward unmet goals.  Personal Factors and Comorbidities Age;Comorbidity 1;Comorbidity 2;Comorbidity 3+;Past/Current Experience;Fitness  Comorbidities PMH: recent hospitilization 05/22/19 atrial fibrillation, dementia, essential hypertension, HLD, diastolic congestive heart failure, aortic valve regurgitation, BPH, and recent small bowel obstruction with recent surgical intervention with lysis of adhesions  Examination-Activity Limitations  (all ADL's)  Examination-Participation Restrictions  (All)  Pt will benefit from skilled therapeutic intervention in order to improve on  the following deficits Decreased balance;Abnormal gait;Decreased endurance;Decreased range of motion;Decreased strength;Decreased mobility;Difficulty walking;Postural dysfunction;Pain  Stability/Clinical Decision Making Evolving/Moderate complexity  Rehab Potential Fair  PT Frequency Other (comment) (2-3)  PT Duration 12 weeks  PT Treatment/Interventions ADLs/Self Care Home Management;Aquatic Therapy;Cryotherapy;Moist Heat;DME Instruction;Gait training;Functional mobility training;Therapeutic activities;Therapeutic exercise;Balance training;Neuromuscular re-education;Manual techniques;Wheelchair mobility training;Patient/family education;Passive range of motion;Taping  PT Next Visit Plan 10th visit progress note next session; continue to start with Scifit for strengthening, working on standing ex's/balance and gait with standard RW  PT Home Exercise Plan LAQ, sitting marches, sitting hamstring curls, SLR, standing at kitchen sink- UE reaching, marching in place, mini squats  Consulted and Agree with Plan of Care Patient;Family member/caregiver  Family Member Consulted wife       Patient will benefit from skilled therapeutic intervention in order to improve the following deficits and impairments:  Decreased balance, Abnormal gait, Decreased endurance, Decreased range of motion, Decreased strength, Decreased mobility, Difficulty walking, Postural dysfunction, Pain  Visit Diagnosis: Muscle weakness (generalized)  Other abnormalities of gait and mobility     Problem List Patient Active Problem List   Diagnosis Date Noted  . Bacteremia due to Enterococcus 05/24/2019  . Urinary tract infection 05/24/2019  . Chronic diastolic CHF (congestive heart failure) ([facility]) 05/22/2019  . Gout 05/22/2019  . Sepsis ([facility]) 05/22/2019  . Protein-calorie malnutrition, severe 05/18/2019  . AKI (acute kidney injury) ([facility]) 05/05/2019  . Transient hypotension 05/05/2019  . Sepsis, Gram negative ([facility])  03/18/2019  . Hypoalbuminemia   . Leukocytosis   . Hyperglycemia   . Persistent atrial fibrillation with RVR ([facility])   . Acute on chronic anemia   . Rupture of left patellar tendon 02/15/2019  . Postoperative pain   . Bipolar 1 disorder ([place] [facility]) 02/14/2019  . Dementia without behavioral disturbance ([facility]) 02/14/2019  . Patellar tendon rupture, left, initial encounter 02/10/2019  . SIRS (systemic inflammatory response syndrome) ([facility]) 02/09/2019  . Osteomyelitis ([facility]) 02/09/2019  . Acute blood loss anemia 02/09/2019  . Aortic valve regurgitation 10/16/2018  . SBO (small bowel obstruction) ([facility]) 07/08/2013  . Hyperlipidemia 12/20/2011  . HTN (hypertension) 06/04/2011  . Atrial fibrillation, chronic ([facility]) 03/01/2011  . Long term (current) use of anticoagulants 03/01/2011  . GERD 10/11/2009  . DIVERTICULOSIS OF COLON 10/11/2009  Willow Ora, PTA, [facility] [address] [place], [place] [address] [phone] 10/23/19, 11:35 PM   Name: Russell Dillon MRN: [id number] Date of Birth: 04-12-1946

## 2019-10-25 ENCOUNTER — Other Ambulatory Visit: Payer: Medicare Other | Admitting: Licensed Clinical Social Worker

## 2019-10-25 ENCOUNTER — Other Ambulatory Visit: Payer: Self-pay

## 2019-10-25 DIAGNOSIS — Z515 Encounter for palliative care: Secondary | ICD-10-CM

## 2019-10-26 ENCOUNTER — Ambulatory Visit: Payer: Medicare Other | Admitting: Physical Therapy

## 2019-10-26 ENCOUNTER — Other Ambulatory Visit: Payer: Self-pay

## 2019-10-26 DIAGNOSIS — R2689 Other abnormalities of gait and mobility: Secondary | ICD-10-CM

## 2019-10-26 DIAGNOSIS — G8929 Other chronic pain: Secondary | ICD-10-CM | POA: Diagnosis not present

## 2019-10-26 DIAGNOSIS — M6281 Muscle weakness (generalized): Secondary | ICD-10-CM

## 2019-10-26 DIAGNOSIS — M25562 Pain in left knee: Secondary | ICD-10-CM | POA: Diagnosis not present

## 2019-10-26 NOTE — Progress Notes (Signed)
COMMUNITY PALLIATIVE CARE SW NOTE  PATIENT NAME: Russell Dillon DOB: 1946/08/08 MRN: [id number]  PRIMARY CARE PROVIDER: Marton Redwood, MD  RESPONSIBLE PARTY:  Acct ID - Guarantor Home Phone Work Phone Relationship Acct Type  [id number] Russell Dillon,* [phone]  Self P/F     [place], [place], [place] [address]   Due to the COVID-19 crisis, this virtual check-in visit was done via telephone from my office and it was initiated and consent given by thispatientand or family.   PLAN OF CARE and INTERVENTIONS:             1. GOALS OF CARE/ ADVANCE CARE PLANNING:  Goal is for patient to have the best quality of life possible.  He remains a full code. 2. SOCIAL/EMOTIONAL/SPIRITUAL ASSESSMENT/ INTERVENTIONS:  SW conducted a Sales executive visit with patient's wife, Russell Dillon.  Patient continues going to PT and is making progress per wife.  Patient's medications have been readjusted due to his blood issues.  Russell Dillon has patient pull himself up to  the sink three times per day.  His appetite remains good.  Their daughter is very supportive also.  SW provided active listening and supportive counseling. 3. PATIENT/CAREGIVER EDUCATION/ COPING:  Russell Dillon copes by relying on her faith. 4. PERSONAL EMERGENCY PLAN:  Wife contacts EMS. 5. COMMUNITY RESOURCES COORDINATION/ HEALTH CARE NAVIGATION:  Patient has private caregivers three times per day. 6. FINANCIAL/LEGAL CONCERNS/INTERVENTIONS:  None.     SOCIAL HX:  Social History   Tobacco Use  . Smoking status: Never Smoker  . Smokeless tobacco: Never Used  Substance Use Topics  . Alcohol use: No    Alcohol/week: 0.0 standard drinks    CODE STATUS: Full Code  ADVANCED DIRECTIVES: No MOST FORM COMPLETE:  No HOSPICE EDUCATION PROVIDED: No PPS:  Patient's appetite is normal.  Patient can use a walker with assistance. Duration of visit and documentation:  30 minutes.      Creola Corn Kaithlyn Teagle, LCSW

## 2019-10-26 NOTE — Therapy (Signed)
[facility] [address] [place], [place], [address] Phone: [phone]   Fax:  [phone]  Physical Therapy Treatment/10th Visit Progress Note  Patient Details  Name: Russell Dillon MRN: [id number] Date of Birth: Jan 29, 1946 Referring Provider (PT): Marton Redwood, MD  Progress Note Reporting Period 09/15/19 to 10/26/19   See note below for Objective Data and Assessment of Progress/Goals.    Encounter Date: 10/26/2019  PT End of Session - 10/26/19 1635    Visit Number  30    Number of Visits  48   recert for 24 more visits due to making progress   Date for PT Re-Evaluation  37/62/83   recerted on 15/17 for 12 more weeks   Authorization Type  needs KX, MCR, AARP, will need progress note visit 30    PT Start Time  1444    PT Stop Time  1528    PT Time Calculation (min)  44 min    Equipment Utilized During Treatment  Gait belt    Activity Tolerance  Patient tolerated treatment well;No increased pain    Behavior During Therapy  WFL for tasks assessed/performed       Past Medical History:  Diagnosis Date  . Acute metabolic encephalopathy 04/26/736  . Aortic valve regurgitation 10/16/2018   Echo 07/28/2017:  EF 60-65, moderate AI, aortic root and ascending aorta mildly dilated (40 mm), ascending aorta 43 mm, MAC, mild MR, mild LAE, moderate RV enlargement, trivial TR // Echo 12/19: EF 60-65, normal wall motion, mild AI, moderate BAE, ascending aorta 43 mm, aortic root 39 mm  . BPH (benign prostatic hyperplasia)   . BPH (benign prostatic hypertrophy)   . Chronic anticoagulation   . Chronic atrial fibrillation ([facility])   . Diastolic dysfunction October 2010   Normal LV systolic function  . Hx SBO   . Hyperlipidemia   . Hypertension   . Patellar tendon rupture, left, initial encounter 02/10/2019  . Severe sepsis ([facility]) 05/05/2019  . Small bowel obstruction [facility])     Past Surgical History:  Procedure Laterality Date   . APPENDECTOMY  2004  . APPLICATION OF WOUND VAC Left 02/11/2019   Procedure: Application Of Wound Vac;  Surgeon: Altamese Cordaville, MD;  Location: [facility];  Service: Orthopedics;  Laterality: Left;  . CARDIOVASCULAR STRESS TEST  09/30/2007   EF 67%  No ischemia.   Marland Kitchen CARDIOVERSION  05/01/2004  . KNEE ARTHROSCOPY Left 02/11/2019   Procedure: ARTHROSCOPY LEFT KNEE;  Surgeon: Altamese Fergus, MD;  Location: [facility];  Service: Orthopedics;  Laterality: Left;  . LAPAROTOMY N/A 05/11/2019   Procedure: EXPLORATORY LAPAROTOMY;  Surgeon: Rolm Bookbinder, MD;  Location: [facility];  Service: General;  Laterality: N/A;  . LYSIS OF ADHESION N/A 05/11/2019   Procedure: LYSIS OF ADHESION;  Surgeon: Rolm Bookbinder, MD;  Location: [facility];  Service: General;  Laterality: N/A;  . PATELLAR TENDON REPAIR Left 02/11/2019   Procedure: LEFT PATELLA TENDON REPAIR;  Surgeon: Altamese Rosa Sanchez, MD;  Location: [facility];  Service: Orthopedics;  Laterality: Left;  . SMALL INTESTINE SURGERY  2004  . US ECHOCARDIOGRAPHY  09/05/2009   ef 55-60%    There were no vitals filed for this visit.  Subjective Assessment - 10/26/19 1455    Subjective  Doing well, got a new wheelchair yesterday (the other one the brakes were not working).    Pertinent History  PMH: recent hospitilization 05/22/19 atrial fibrillation, dementia, essential hypertension, HLD, diastolic congestive heart failure, aortic valve regurgitation,  BPH, and recent small bowel obstruction with recent surgical intervention with lysis of adhesions    Limitations  Walking;Standing    How long can you stand comfortably?  can not stand on his own    How long can you walk comfortably?  can not walk on his own    Patient Stated Goals  to walk again    Currently in Pain?  No/denies                       [facility] Adult PT Treatment/Exercise - 10/26/19 1456      Transfers   Transfers  Sit to Stand;Stand to Sit    Sit to Stand  4: Min guard;4: Min assist;With upper  extremity assist;From chair/3-in-1    Sit to Stand Details  Verbal cues for sequencing;Verbal cues for technique;Verbal cues for safe use of DME/AE;Manual facilitation for weight shifting    Sit to Stand Details (indicate cue type and reason)  x5 reps at countertop with BUE assist at sink. min A for sit <> stands from w/c > RW, cues to stand on the count of 3    Stand to Sit  4: Min guard;With upper extremity assist;To chair/3-in-1;5: Supervision    Stand to Sit Details (indicate cue type and reason)  Verbal cues for sequencing;Verbal cues for technique;Verbal cues for safe use of DME/AE;Verbal cues for precautions/safety    Stand to Sit Details  cues to reach posteriorly at w/c before sitting down    Comments  At counter top holding onto sink 2 x 5 reps step out to R, back to middle and step out to L and back to middle - pt needing simple cues to complete as at first pt tries to sidestep down countertop. BUE support at sink 2 x 10 reps standing marching, cues for slowed and controlled.      Ambulation/Gait   Ambulation/Gait  Yes    Ambulation/Gait Assistance  3: Mod assist   x2, plus w/c follow for safety   Ambulation/Gait Assistance Details  Cues/facilitation for upright posture, RW navigation/steering and keeping RW close to body     Ambulation Distance (Feet)  175 Feet   total - 3 separate bouts   Assistive device  Rolling walker    Gait Pattern  Step-through pattern;Decreased step length - right;Decreased step length - left;Right flexed knee in stance;Left flexed knee in stance;Trunk flexed    Ambulation Surface  Level;Indoor      Exercises   Other Exercises   Scifit for BUE/BLE level 5.5 for 10 minutes with goal of 30 rpm for strengthening, endurance, and activity tolerance. blocks posteriorly behind wheels to prevent tipping.                PT Short Term Goals - 10/15/19 1353      PT SHORT TERM GOAL #1   Title  Pt will be I and compliant with  HEP revision. (target for STG  4weeks 11/04/19)    Baseline  was compliant with initial HEP, needs HEP progression for more standing activity at sink    Status  On-going      PT SHORT TERM GOAL #2   Title  Pt will be able to perform sit to stand with min assist with AD.    Baseline  able to show modified with assitive device or with using sink/counter top    Status  Achieved        PT Long Term Goals - 10/26/19  [place] #1   Title  Pt will be I and compliant with final HEP. (target for all LTG 12 weeks 12/23/19)    Status  On-going      PT LONG TERM GOAL #2   Title  Pt will be able to perform sit to stand mod I.    Baseline  min A when standing with RW with w/c, min guard at countertop with BUE support    Status  On-going      PT LONG TERM GOAL #3   Title  Pt will be able perform stand pivot transfers min A to decrease caregiver burden.    Baseline  not tested today but can perfrom lateral scoot transfer independently to min guard    Status  On-going      PT LONG TERM GOAL #4   Title  Pt will be able to tolerate standing for 5 min with UE support.    Baseline  able to stand at countertop today for approx 2-3 minutes at a time with BUE support while performing exercises    Status  On-going      PT LONG TERM GOAL #5   Title  Pt will improve LE strength to at least 4/5 MMT bilat    Baseline  difficult to assess due to cognitive deficits but at least 4/5    Status  Achieved      PT LONG TERM GOAL #6   Title  New goal created 09/30/19. He will be able to ambulate at least 300 ft for improved household ambulation, limited community ambulaiton with no more than min A LRAD.    Baseline  ambulated a total of 175 ft (3 bouts) with mod A and w/c follow for safety    Status  On-going            Plan - 10/26/19 1644    Clinical Impression Statement  10th visit progress note: Pt is progressing well in therapy, now is more motivated to participate. Pt able to perform sit <> stands at  countertop with min guard and BUE support, from w/c and RW pt needing min A to stand. Pt able to ambulate a total of 175 ft today (3 different bouts), needed rest break in between each due to fatigue. Pt needing mod A of 1-2 for safe ambulation, RW navigation/steering and w/c follow for safety. Cues needed throughout gait today to keep RW close to body as pt tends to push it too far anteriorly. Will continue to progress towards LTGs.    Personal Factors and Comorbidities  Age;Comorbidity 1;Comorbidity 2;Comorbidity 3+;Past/Current Experience;Fitness    Comorbidities  PMH: recent hospitilization 05/22/19 atrial fibrillation, dementia, essential hypertension, HLD, diastolic congestive heart failure, aortic valve regurgitation, BPH, and recent small bowel obstruction with recent surgical intervention with lysis of adhesions    Examination-Activity Limitations  --   all ADL's   Examination-Participation Restrictions  --   All   Stability/Clinical Decision Making  Evolving/Moderate complexity    Rehab Potential  Fair    PT Frequency  Other (comment)   2-3   PT Duration  12 weeks    PT Treatment/Interventions  ADLs/Self Care Home Management;Aquatic Therapy;Cryotherapy;Moist Heat;DME Instruction;Gait training;Functional mobility training;Therapeutic activities;Therapeutic exercise;Balance training;Neuromuscular re-education;Manual techniques;Wheelchair mobility training;Patient/family education;Passive range of motion;Taping    PT Next Visit Plan  continue to progress gait with RW, SciFit for warm-up, standing activities at countertop and parallel bars, sit to stands with RW  PT Home Exercise Plan  LAQ, sitting marches, sitting hamstring curls, SLR, standing at kitchen sink- UE reaching, marching in place, mini squats    Consulted and Agree with Plan of Care  Patient;Family member/caregiver    Family Member Consulted  wife       Patient will benefit from skilled therapeutic intervention in order to  improve the following deficits and impairments:  Decreased balance, Abnormal gait, Decreased endurance, Decreased range of motion, Decreased strength, Decreased mobility, Difficulty walking, Postural dysfunction, Pain  Visit Diagnosis: Other abnormalities of gait and mobility  Muscle weakness (generalized)     Problem List Patient Active Problem List   Diagnosis Date Noted  . Bacteremia due to Enterococcus 05/24/2019  . Urinary tract infection 05/24/2019  . Chronic diastolic CHF (congestive heart failure) ([facility]) 05/22/2019  . Gout 05/22/2019  . Sepsis ([facility]) 05/22/2019  . Protein-calorie malnutrition, severe 05/18/2019  . AKI (acute kidney injury) ([facility]) 05/05/2019  . Transient hypotension 05/05/2019  . Sepsis, Gram negative ([facility]) 03/18/2019  . Hypoalbuminemia   . Leukocytosis   . Hyperglycemia   . Persistent atrial fibrillation with RVR ([facility])   . Acute on chronic anemia   . Rupture of left patellar tendon 02/15/2019  . Postoperative pain   . Bipolar 1 disorder ([facility]) 02/14/2019  . Dementia without behavioral disturbance ([facility]) 02/14/2019  . Patellar tendon rupture, left, initial encounter 02/10/2019  . SIRS (systemic inflammatory response syndrome) ([place]) 02/09/2019  . Osteomyelitis ([facility]) 02/09/2019  . Acute blood loss anemia 02/09/2019  . Aortic valve regurgitation 10/16/2018  . SBO (small bowel obstruction) ([facility]) 07/08/2013  . Hyperlipidemia 12/20/2011  . HTN (hypertension) 06/04/2011  . Atrial fibrillation, chronic ([place]) 03/01/2011  . Long term (current) use of anticoagulants 03/01/2011  . GERD 10/11/2009  . DIVERTICULOSIS OF COLON 10/11/2009    Arliss Journey, PT, DPT 10/26/2019, 4:45 PM  [facility] [address] [place], [place], [address] Phone: [phone]   Fax:  [phone]  Name: Russell Dillon MRN: [id number] Date of Birth: [birth year]

## 2019-10-29 ENCOUNTER — Other Ambulatory Visit: Payer: Self-pay

## 2019-10-29 ENCOUNTER — Encounter: Payer: Self-pay | Admitting: Physical Therapy

## 2019-10-29 ENCOUNTER — Ambulatory Visit: Payer: Medicare Other | Admitting: Physical Therapy

## 2019-10-29 DIAGNOSIS — R2689 Other abnormalities of gait and mobility: Secondary | ICD-10-CM | POA: Diagnosis not present

## 2019-10-29 DIAGNOSIS — G8929 Other chronic pain: Secondary | ICD-10-CM | POA: Diagnosis not present

## 2019-10-29 DIAGNOSIS — M6281 Muscle weakness (generalized): Secondary | ICD-10-CM | POA: Diagnosis not present

## 2019-10-29 DIAGNOSIS — M25562 Pain in left knee: Secondary | ICD-10-CM | POA: Diagnosis not present

## 2019-10-30 NOTE — Therapy (Signed)
[facility] [address] [place], [place], [address] Phone: [phone]   Fax:  [phone]  Physical Therapy Treatment  Patient Details  Name: Russell Dillon MRN: [id number] Date of Birth: 1946/04/01 Referring Provider (PT): Marton Redwood, MD   Encounter Date: 10/29/2019  PT End of Session - 10/29/19 1407    Visit Number  31    Number of Visits  36   recert for 24 more visits due to making progress   Date for PT Re-Evaluation  88/82/80   recerted on 03/49 for 12 more weeks   Authorization Type  needs KX, MCR, AARP, will need progress note visit 30    PT Start Time  1403    PT Stop Time  1445    PT Time Calculation (min)  42 min    Equipment Utilized During Treatment  Gait belt    Activity Tolerance  Patient tolerated treatment well;No increased pain    Behavior During Therapy  WFL for tasks assessed/performed       Past Medical History:  Diagnosis Date  . Acute metabolic encephalopathy 1/79/1505  . Aortic valve regurgitation 10/16/2018   Echo 07/28/2017:  EF 60-65, moderate AI, aortic root and ascending aorta mildly dilated (40 mm), ascending aorta 43 mm, MAC, mild MR, mild LAE, moderate RV enlargement, trivial TR // Echo 12/19: EF 60-65, normal wall motion, mild AI, moderate BAE, ascending aorta 43 mm, aortic root 39 mm  . BPH (benign prostatic hyperplasia)   . BPH (benign prostatic hypertrophy)   . Chronic anticoagulation   . Chronic atrial fibrillation ([place])   . Diastolic dysfunction October 2010   Normal LV systolic function  . Hx SBO   . Hyperlipidemia   . Hypertension   . Patellar tendon rupture, left, initial encounter 02/10/2019  . Severe sepsis ([place]) 05/05/2019  . Small bowel obstruction [facility])     Past Surgical History:  Procedure Laterality Date  . APPENDECTOMY  2004  . APPLICATION OF WOUND VAC Left 02/11/2019   Procedure: Application Of Wound Vac;  Surgeon: Altamese Holcomb, MD;  Location: [place];  Service: Orthopedics;  Laterality: Left;  . CARDIOVASCULAR STRESS TEST  09/30/2007   EF 67%  No ischemia.   Marland Kitchen CARDIOVERSION  05/01/2004  . KNEE ARTHROSCOPY Left 02/11/2019   Procedure: ARTHROSCOPY LEFT KNEE;  Surgeon: Altamese Calumet, MD;  Location: [place];  Service: Orthopedics;  Laterality: Left;  . LAPAROTOMY N/A 05/11/2019   Procedure: EXPLORATORY LAPAROTOMY;  Surgeon: Rolm Bookbinder, MD;  Location: [place];  Service: General;  Laterality: N/A;  . LYSIS OF ADHESION N/A 05/11/2019   Procedure: LYSIS OF ADHESION;  Surgeon: Rolm Bookbinder, MD;  Location: [place];  Service: General;  Laterality: N/A;  . PATELLAR TENDON REPAIR Left 02/11/2019   Procedure: LEFT PATELLA TENDON REPAIR;  Surgeon: Altamese Troy, MD;  Location: [place];  Service: Orthopedics;  Laterality: Left;  . SMALL INTESTINE SURGERY  2004  . US ECHOCARDIOGRAPHY  09/05/2009   ef 55-60%    There were no vitals filed for this visit.  Subjective Assessment - 10/29/19 1407    Subjective  No new complaints. No falls or pain to report.    Pertinent History  PMH: recent hospitilization 05/22/19 atrial fibrillation, dementia, essential hypertension, HLD, diastolic congestive heart failure, aortic valve regurgitation, BPH, and recent small bowel obstruction with recent surgical intervention with lysis of adhesions    How long can you stand comfortably?  can not stand on his own  How long can you walk comfortably?  can not walk on his own    Patient Stated Goals  to walk again    Currently in Pain?  No/denies    Pain Score  0-No pain            [facility] Adult PT Treatment/Exercise - 10/29/19 1408      Transfers   Transfers  Sit to Stand;Stand to Sit    Sit to Stand  4: Min guard;4: Min assist;With upper extremity assist;From chair/3-in-1    Stand to Sit  4: Min guard;With upper extremity assist;To chair/3-in-1;5: Supervision      Ambulation/Gait   Ambulation/Gait  Yes    Ambulation/Gait Assistance  3: Mod assist;2: Max  assist    Ambulation/Gait Assistance Details  max assist with 1st lap with one person hands on/second stand by, then min/mod assist of 2 people hands on with rest of gait. cues/faciliation needed for posture, walker position and step length with gait. spouse bringing chair behind pt with all gait reps.     Ambulation Distance (Feet)  35 Feet   x1, 45 x1, 25 x1   Assistive device  Rolling walker    Gait Pattern  Step-through pattern;Decreased step length - right;Decreased step length - left;Right flexed knee in stance;Left flexed knee in stance;Trunk flexed    Ambulation Surface  Level;Indoor      Exercises   Exercises  Other Exercises    Other Exercises   in parallel bars with UE support: forward step ups onto 4 inch box/back down backwards for 3 sets of 3 reps with seated rest breaks between each rep, bil UE support and min to mod assist.       Knee/Hip Exercises: Aerobic   Other Aerobic  UE/LE level 6 for 8 minutes with goal of 35 rpm, pt able to maintain a consistent pace without cues today. assist needed to stabilize wheelchair from tipping back with blocks behind wheels.                PT Short Term Goals - 10/15/19 1353      PT SHORT TERM GOAL #1   Title  Pt will be I and compliant with  HEP revision. (target for STG 4weeks 11/04/19)    Baseline  was compliant with initial HEP, needs HEP progression for more standing activity at sink    Status  On-going      PT SHORT TERM GOAL #2   Title  Pt will be able to perform sit to stand with min assist with AD.    Baseline  able to show modified with assitive device or with using sink/counter top    Status  Achieved        PT Long Term Goals - 10/26/19 1637      PT LONG TERM GOAL #1   Title  Pt will be I and compliant with final HEP. (target for all LTG 12 weeks 12/23/19)    Status  On-going      PT LONG TERM GOAL #2   Title  Pt will be able to perform sit to stand mod I.    Baseline  min A when standing with RW with w/c,  min guard at countertop with BUE support    Status  On-going      PT LONG TERM GOAL #3   Title  Pt will be able perform stand pivot transfers min A to decrease caregiver burden.    Baseline  not tested today  but can perfrom lateral scoot transfer independently to min guard    Status  On-going      PT LONG TERM GOAL #4   Title  Pt will be able to tolerate standing for 5 min with UE support.    Baseline  able to stand at countertop today for approx 2-3 minutes at a time with BUE support while performing exercises    Status  On-going      PT LONG TERM GOAL #5   Title  Pt will improve LE strength to at least 4/5 MMT bilat    Baseline  difficult to assess due to cognitive deficits but at least 4/5    Status  Achieved      PT LONG TERM GOAL #6   Title  New goal created 09/30/19. He will be able to ambulate at least 300 ft for improved household ambulation, limited community ambulaiton with no more than min A LRAD.    Baseline  ambulated a total of 175 ft (3 bouts) with mod A and w/c follow for safety    Status  On-going            Plan - 10/29/19 1407    Clinical Impression Statement  Today's skilled session continued to address strengthening, transfers and gait with RW. The pt continues to make steady progress toward goals and should benefit from continued PT to progress toward unmet goals    Personal Factors and Comorbidities  Age;Comorbidity 1;Comorbidity 2;Comorbidity 3+;Past/Current Experience;Fitness    Comorbidities  PMH: recent hospitilization 05/22/19 atrial fibrillation, dementia, essential hypertension, HLD, diastolic congestive heart failure, aortic valve regurgitation, BPH, and recent small bowel obstruction with recent surgical intervention with lysis of adhesions    Examination-Activity Limitations  --   all ADL's   Examination-Participation Restrictions  --   All   Stability/Clinical Decision Making  Evolving/Moderate complexity    Rehab Potential  Fair    PT  Frequency  Other (comment)   2-3   PT Duration  12 weeks    PT Treatment/Interventions  ADLs/Self Care Home Management;Aquatic Therapy;Cryotherapy;Moist Heat;DME Instruction;Gait training;Functional mobility training;Therapeutic activities;Therapeutic exercise;Balance training;Neuromuscular re-education;Manual techniques;Wheelchair mobility training;Patient/family education;Passive range of motion;Taping    PT Next Visit Plan  continue to progress gait with RW, SciFit for warm-up, standing activities at countertop and parallel bars, sit to stands with RW    PT Home Exercise Plan  LAQ, sitting marches, sitting hamstring curls, SLR, standing at kitchen sink- UE reaching, marching in place, mini squats    Consulted and Agree with Plan of Care  Patient;Family member/caregiver    Family Member Consulted  wife       Patient will benefit from skilled therapeutic intervention in order to improve the following deficits and impairments:  Decreased balance, Abnormal gait, Decreased endurance, Decreased range of motion, Decreased strength, Decreased mobility, Difficulty walking, Postural dysfunction, Pain  Visit Diagnosis: Other abnormalities of gait and mobility  Muscle weakness (generalized)     Problem List Patient Active Problem List   Diagnosis Date Noted  . Bacteremia due to Enterococcus 05/24/2019  . Urinary tract infection 05/24/2019  . Chronic diastolic CHF (congestive heart failure) ([place]) 05/22/2019  . Gout 05/22/2019  . Sepsis ([place]) 05/22/2019  . Protein-calorie malnutrition, severe 05/18/2019  . AKI (acute kidney injury) ([place]) 05/05/2019  . Transient hypotension 05/05/2019  . Sepsis, Gram negative ([place]) 03/18/2019  . Hypoalbuminemia   . Leukocytosis   . Hyperglycemia   . Persistent atrial fibrillation with RVR ([place])   .  Acute on chronic anemia   . Rupture of left patellar tendon 02/15/2019  . Postoperative pain   . Bipolar 1 disorder ([place]) 02/14/2019  . Dementia without  behavioral disturbance ([place]) 02/14/2019  . Patellar tendon rupture, left, initial encounter 02/10/2019  . SIRS (systemic inflammatory response syndrome) ([place]) 02/09/2019  . Osteomyelitis ([place]) 02/09/2019  . Acute blood loss anemia 02/09/2019  . Aortic valve regurgitation 10/16/2018  . SBO (small bowel obstruction) ([place]) 07/08/2013  . Hyperlipidemia 12/20/2011  . HTN (hypertension) 06/04/2011  . Atrial fibrillation, chronic ([place]) 03/01/2011  . Long term (current) use of anticoagulants 03/01/2011  . GERD 10/11/2009  . DIVERTICULOSIS OF COLON 10/11/2009    Willow Ora, PTA, [facility] [address] [place], [place] [address] [phone] 10/30/19, 2:06 PM   Name: Russell Dillon MRN: [id number] Date of Birth: [birth year]

## 2019-11-01 ENCOUNTER — Ambulatory Visit: Payer: Medicare Other | Admitting: Physical Therapy

## 2019-11-01 ENCOUNTER — Other Ambulatory Visit: Payer: Self-pay

## 2019-11-01 DIAGNOSIS — M25562 Pain in left knee: Secondary | ICD-10-CM | POA: Diagnosis not present

## 2019-11-01 DIAGNOSIS — M6281 Muscle weakness (generalized): Secondary | ICD-10-CM | POA: Diagnosis not present

## 2019-11-01 DIAGNOSIS — G8929 Other chronic pain: Secondary | ICD-10-CM | POA: Diagnosis not present

## 2019-11-01 DIAGNOSIS — R2689 Other abnormalities of gait and mobility: Secondary | ICD-10-CM

## 2019-11-01 NOTE — Therapy (Signed)
[facility] [address] [place], [place], [address] Phone: [phone]   Fax:  [phone]  Physical Therapy Treatment  Patient Details  Name: Russell Dillon MRN: [id number] Date of Birth: 04-13-1946 Referring Provider (PT): Marton Redwood, MD   Encounter Date: 11/01/2019  PT End of Session - 11/01/19 1641    Visit Number  32    Number of Visits  88   recert for 24 more visits due to making progress   Date for PT Re-Evaluation  91/63/84   recerted on 66/59 for 12 more weeks   Authorization Type  needs KX, MCR, AARP, will need progress note visit 30    PT Start Time  1405    PT Stop Time  1443    PT Time Calculation (min)  38 min    Equipment Utilized During Treatment  Gait belt    Activity Tolerance  Patient tolerated treatment well;No increased pain    Behavior During Therapy  WFL for tasks assessed/performed       Past Medical History:  Diagnosis Date  . Acute metabolic encephalopathy 9/35/7017  . Aortic valve regurgitation 10/16/2018   Echo 07/28/2017:  EF 60-65, moderate AI, aortic root and ascending aorta mildly dilated (40 mm), ascending aorta 43 mm, MAC, mild MR, mild LAE, moderate RV enlargement, trivial TR // Echo 12/19: EF 60-65, normal wall motion, mild AI, moderate BAE, ascending aorta 43 mm, aortic root 39 mm  . BPH (benign prostatic hyperplasia)   . BPH (benign prostatic hypertrophy)   . Chronic anticoagulation   . Chronic atrial fibrillation ([place])   . Diastolic dysfunction October 2010   Normal LV systolic function  . Hx SBO   . Hyperlipidemia   . Hypertension   . Patellar tendon rupture, left, initial encounter 02/10/2019  . Severe sepsis ([place]) 05/05/2019  . Small bowel obstruction [facility])     Past Surgical History:  Procedure Laterality Date  . APPENDECTOMY  2004  . APPLICATION OF WOUND VAC Left 02/11/2019   Procedure: Application Of Wound Vac;  Surgeon: Altamese Frankenmuth, MD;  Location: [place] [facility];  Service: Orthopedics;  Laterality: Left;  . CARDIOVASCULAR STRESS TEST  09/30/2007   EF 67%  No ischemia.   Marland Kitchen CARDIOVERSION  05/01/2004  . KNEE ARTHROSCOPY Left 02/11/2019   Procedure: ARTHROSCOPY LEFT KNEE;  Surgeon: Altamese Eagle, MD;  Location: [place];  Service: Orthopedics;  Laterality: Left;  . LAPAROTOMY N/A 05/11/2019   Procedure: EXPLORATORY LAPAROTOMY;  Surgeon: Rolm Bookbinder, MD;  Location: [place];  Service: General;  Laterality: N/A;  . LYSIS OF ADHESION N/A 05/11/2019   Procedure: LYSIS OF ADHESION;  Surgeon: Rolm Bookbinder, MD;  Location: [place];  Service: General;  Laterality: N/A;  . PATELLAR TENDON REPAIR Left 02/11/2019   Procedure: LEFT PATELLA TENDON REPAIR;  Surgeon: Altamese [place]ville, MD;  Location: [place];  Service: Orthopedics;  Laterality: Left;  . SMALL INTESTINE SURGERY  2004  . US ECHOCARDIOGRAPHY  09/05/2009   ef 55-60%    There were no vitals filed for this visit.  Subjective Assessment - 11/01/19 1418    Subjective  No new complaints, no falls or pain to report.    Pertinent History  PMH: recent hospitilization 05/22/19 atrial fibrillation, dementia, essential hypertension, HLD, diastolic congestive heart failure, aortic valve regurgitation, BPH, and recent small bowel obstruction with recent surgical intervention with lysis of adhesions    How long can you stand comfortably?  can not stand on his own  How long can you walk comfortably?  can not walk on his own    Patient Stated Goals  to walk again    Currently in Pain?  No/denies                       [facility] Adult PT Treatment/Exercise - 11/01/19 1420      Transfers   Transfers  Sit to Stand;Stand to Sit    Sit to Stand  5: Supervision    Sit to Stand Details (indicate cue type and reason)  in // bars and outside of bars use of BUE support to pull to stand     Stand to Sit  5: Supervision;4: Min guard    Stand to Sit Details  cues to reach posteriorly before sitting back in w/c       Ambulation/Gait   Ambulation/Gait  Yes    Ambulation/Gait Assistance  4: Min assist    Ambulation/Gait Assistance Details  Pt ambulating in // bars with BUE support with w/c follow for safety, cues for posture to stand tall and for larger step lengths. Ambulated one bout and then was wheeled back to where pt started ambulating in // bars    Ambulation Distance (Feet)  40 Feet   4 x 10' in // bars   Assistive device  Parallel bars   BUE support   Gait Pattern  Step-through pattern;Decreased step length - right;Decreased step length - left;Right flexed knee in stance;Left flexed knee in stance;Trunk flexed    Ambulation Surface  Level;Indoor      Exercises   Exercises  Other Exercises    Other Exercises   SciFit Level 5 for 10 minutes with BLE and BUE for 10 minutes for strengthening, endurance, ROM, and activity tolerance. On outside of // bars: side stepping down and back 2 reps with w/c follow for safety. Cues for posture and looking upright. In inside of // bars: with BUE support 1 x 10 reps standing marching with cues to look upright into mirror.                 PT Short Term Goals - 10/15/19 1353      PT SHORT TERM GOAL #1   Title  Pt will be I and compliant with  HEP revision. (target for STG 4weeks 11/04/19)    Baseline  was compliant with initial HEP, needs HEP progression for more standing activity at sink    Status  On-going      PT SHORT TERM GOAL #2   Title  Pt will be able to perform sit to stand with min assist with AD.    Baseline  able to show modified with assitive device or with using sink/counter top    Status  Achieved        PT Long Term Goals - 10/26/19 1637      PT LONG TERM GOAL #1   Title  Pt will be I and compliant with final HEP. (target for all LTG 12 weeks 12/23/19)    Status  On-going      PT LONG TERM GOAL #2   Title  Pt will be able to perform sit to stand mod I.    Baseline  min A when standing with RW with w/c, min guard at countertop  with BUE support    Status  On-going      PT LONG TERM GOAL #3   Title  Pt will be able perform  stand pivot transfers min A to decrease caregiver burden.    Baseline  not tested today but can perfrom lateral scoot transfer independently to min guard    Status  On-going      PT LONG TERM GOAL #4   Title  Pt will be able to tolerate standing for 5 min with UE support.    Baseline  able to stand at countertop today for approx 2-3 minutes at a time with BUE support while performing exercises    Status  On-going      PT LONG TERM GOAL #5   Title  Pt will improve LE strength to at least 4/5 MMT bilat    Baseline  difficult to assess due to cognitive deficits but at least 4/5    Status  Achieved      PT LONG TERM GOAL #6   Title  New goal created 09/30/19. He will be able to ambulate at least 300 ft for improved household ambulation, limited community ambulaiton with no more than min A LRAD.    Baseline  ambulated a total of 175 ft (3 bouts) with mod A and w/c follow for safety    Status  On-going            Plan - 11/01/19 1648    Clinical Impression Statement  Focus of today's skilled session was LE strengthening, activity tolerance, standing balance at // bars and gait in // bars. Pt motivated throughout today's session today, able to tolerate more standing activity without seated rest breaks. Cues throughout session for upright posture. Pt will continue to benefit from skilled PT to progress towards LTGs.    Personal Factors and Comorbidities  Age;Comorbidity 1;Comorbidity 2;Comorbidity 3+;Past/Current Experience;Fitness    Comorbidities  PMH: recent hospitilization 05/22/19 atrial fibrillation, dementia, essential hypertension, HLD, diastolic congestive heart failure, aortic valve regurgitation, BPH, and recent small bowel obstruction with recent surgical intervention with lysis of adhesions    Examination-Activity Limitations  --   all ADL's   Examination-Participation Restrictions   --   All   Stability/Clinical Decision Making  Evolving/Moderate complexity    Rehab Potential  Fair    PT Frequency  Other (comment)   2-3   PT Duration  12 weeks    PT Treatment/Interventions  ADLs/Self Care Home Management;Aquatic Therapy;Cryotherapy;Moist Heat;DME Instruction;Gait training;Functional mobility training;Therapeutic activities;Therapeutic exercise;Balance training;Neuromuscular re-education;Manual techniques;Wheelchair mobility training;Patient/family education;Passive range of motion;Taping    PT Next Visit Plan  continue to progress gait with RW, SciFit for warm-up, standing activities at countertop and parallel bars, sit to stands with RW    PT Home Exercise Plan  LAQ, sitting marches, sitting hamstring curls, SLR, standing at kitchen sink- UE reaching, marching in place, mini squats    Consulted and Agree with Plan of Care  Patient;Family member/caregiver    Family Member Consulted  wife       Patient will benefit from skilled therapeutic intervention in order to improve the following deficits and impairments:  Decreased balance, Abnormal gait, Decreased endurance, Decreased range of motion, Decreased strength, Decreased mobility, Difficulty walking, Postural dysfunction, Pain  Visit Diagnosis: Other abnormalities of gait and mobility  Muscle weakness (generalized)     Problem List Patient Active Problem List   Diagnosis Date Noted  . Bacteremia due to Enterococcus 05/24/2019  . Urinary tract infection 05/24/2019  . Chronic diastolic CHF (congestive heart failure) ([place]) 05/22/2019  . Gout 05/22/2019  . Sepsis ([name]) 05/22/2019  . Protein-calorie malnutrition, severe 05/18/2019  . AKI (acute  kidney injury) ([place]) 05/05/2019  . Transient hypotension 05/05/2019  . Sepsis, Gram negative ([place]) 03/18/2019  . Hypoalbuminemia   . Leukocytosis   . Hyperglycemia   . Persistent atrial fibrillation with RVR ([place])   . Acute on chronic anemia   . Rupture of left  patellar tendon 02/15/2019  . Postoperative pain   . Bipolar 1 disorder ([place]) 02/14/2019  . Dementia without behavioral disturbance ([place]) 02/14/2019  . Patellar tendon rupture, left, initial encounter 02/10/2019  . SIRS (systemic inflammatory response syndrome) ([place]) 02/09/2019  . Osteomyelitis ([place]) 02/09/2019  . Acute blood loss anemia 02/09/2019  . Aortic valve regurgitation 10/16/2018  . SBO (small bowel obstruction) ([place]) 07/08/2013  . Hyperlipidemia 12/20/2011  . HTN (hypertension) 06/04/2011  . Atrial fibrillation, chronic ([place]) 03/01/2011  . Long term (current) use of anticoagulants 03/01/2011  . GERD 10/11/2009  . DIVERTICULOSIS OF COLON 10/11/2009    Arliss Journey, PT, DPT 11/01/2019, 4:51 PM  [facility] [address] [place], [place], [address] Phone: [phone]   Fax:  [phone]  Name: Russell Dillon MRN: [id number] Date of Birth: 1946/08/26

## 2019-11-03 ENCOUNTER — Ambulatory Visit: Payer: Medicare Other | Admitting: Physical Therapy

## 2019-11-03 ENCOUNTER — Encounter: Payer: Self-pay | Admitting: Physical Therapy

## 2019-11-03 ENCOUNTER — Other Ambulatory Visit: Payer: Self-pay

## 2019-11-03 DIAGNOSIS — R2689 Other abnormalities of gait and mobility: Secondary | ICD-10-CM | POA: Diagnosis not present

## 2019-11-03 DIAGNOSIS — G8929 Other chronic pain: Secondary | ICD-10-CM | POA: Diagnosis not present

## 2019-11-03 DIAGNOSIS — M6281 Muscle weakness (generalized): Secondary | ICD-10-CM

## 2019-11-03 DIAGNOSIS — M25562 Pain in left knee: Secondary | ICD-10-CM | POA: Diagnosis not present

## 2019-11-05 NOTE — Therapy (Signed)
[facility] [address] [place], [place], [address] Phone: [phone]   Fax:  [phone]  Physical Therapy Treatment  Patient Details  Name: Russell Dillon MRN: [id number] Date of Birth: 15-Feb-1946 Referring Provider (PT): Marton Redwood, MD   Encounter Date: 11/03/2019   11/03/19 1404  PT Visits / Re-Eval  Visit Number 33  Number of Visits 63 (recert for 24 more visits due to making progress)  Date for PT Re-Evaluation 05/06/93 (recerted on 85/46 for 12 more weeks)  Authorization  Authorization Type needs KX, MCR, AARP, will need progress note visit 30  PT Time Calculation  PT Start Time 1402  PT Stop Time 1441  PT Time Calculation (min) 39 min  PT - End of Session  Equipment Utilized During Treatment Gait belt  Activity Tolerance Patient tolerated treatment well;No increased pain  Behavior During Therapy WFL for tasks assessed/performed     Past Medical History:  Diagnosis Date  . Acute metabolic encephalopathy 2/70/3500  . Aortic valve regurgitation 10/16/2018   Echo 07/28/2017:  EF 60-65, moderate AI, aortic root and ascending aorta mildly dilated (40 mm), ascending aorta 43 mm, MAC, mild MR, mild LAE, moderate RV enlargement, trivial TR // Echo 12/19: EF 60-65, normal wall motion, mild AI, moderate BAE, ascending aorta 43 mm, aortic root 39 mm  . BPH (benign prostatic hyperplasia)   . BPH (benign prostatic hypertrophy)   . Chronic anticoagulation   . Chronic atrial fibrillation ([place])   . Diastolic dysfunction October 2010   Normal LV systolic function  . Hx SBO   . Hyperlipidemia   . Hypertension   . Patellar tendon rupture, left, initial encounter 02/10/2019  . Severe sepsis ([place]) 05/05/2019  . Small bowel obstruction [facility])     Past Surgical History:  Procedure Laterality Date  . APPENDECTOMY  2004  . APPLICATION OF WOUND VAC Left 02/11/2019   Procedure: Application Of Wound Vac;  Surgeon:  Altamese [place]kes[place], MD;  Location: [facility];  Service: Orthopedics;  Laterality: Left;  . CARDIOVASCULAR STRESS TEST  09/30/2007   EF 67%  No ischemia.   Marland Kitchen CARDIOVERSION  05/01/2004  . KNEE ARTHROSCOPY Left 02/11/2019   Procedure: ARTHROSCOPY LEFT KNEE;  Surgeon: Altamese Parsons, MD;  Location: [facility];  Service: Orthopedics;  Laterality: Left;  . LAPAROTOMY N/A 05/11/2019   Procedure: EXPLORATORY LAPAROTOMY;  Surgeon: Rolm Bookbinder, MD;  Location: [facility];  Service: General;  Laterality: N/A;  . LYSIS OF ADHESION N/A 05/11/2019   Procedure: LYSIS OF ADHESION;  Surgeon: Rolm Bookbinder, MD;  Location: [facility];  Service: General;  Laterality: N/A;  . PATELLAR TENDON REPAIR Left 02/11/2019   Procedure: LEFT PATELLA TENDON REPAIR;  Surgeon: Altamese [place], MD;  Location: [facility];  Service: Orthopedics;  Laterality: Left;  . SMALL INTESTINE SURGERY  2004  . US ECHOCARDIOGRAPHY  09/05/2009   ef 55-60%    There were no vitals filed for this visit.     11/03/19 1404  Symptoms/Limitations  Subjective No new complaints, no falls or pain to report. Standing at sink is going well, pt is now pulling himself up without any assistance at all.  Pertinent History PMH: recent hospitilization 05/22/19 atrial fibrillation, dementia, essential hypertension, HLD, diastolic congestive heart failure, aortic valve regurgitation, BPH, and recent small bowel obstruction with recent surgical intervention with lysis of adhesions  Limitations Walking;Standing  How long can you stand comfortably? can not stand on his own  How long can you walk comfortably? can  not walk on his own  Patient Stated Goals to walk again  Pain Assessment  Currently in Pain? No/denies  Pain Score 0      11/03/19 1405  Transfers  Transfers Sit to Stand;Stand to Sit  Sit to Stand 5: Supervision;4: Min guard;With upper extremity assist;From chair/3-in-1  Stand to Sit 5: Supervision;4: Min guard;With upper extremity assist;To chair/3-in-1  Comments x3  reps at parallel bars, then 3 reps to RW  Ambulation/Gait  Ambulation/Gait Yes  Ambulation/Gait Assistance 4: Min assist;3: Mod assist (second person stand by)  Ambulation/Gait Assistance Details cues/assist for posture and walker position. Pt mostly a one person assist with gait today until fatigued, then needed 2 person assist for safety.  Ambulation Distance (Feet) 40 Feet (x1, 45 x2)  Assistive device Rolling walker  Gait Pattern Step-through pattern;Decreased step length - right;Decreased step length - left;Right flexed knee in stance;Left flexed knee in stance;Trunk flexed  Ambulation Surface Level;Indoor  Exercises  Exercises Other Exercises  Other Exercises  in parallel bars: step up/downs with 4 inch step for 3 reps x 3 sets. attempted 6 inch step with pt unable to step up onto it.   Knee/Hip Exercises: Aerobic  Other Aerobic UE/LE level 6 for 8 minutes with goal of >/=40 rpm, pt able to maintain a consistent pace without cues today. assist needed to stabilize wheelchair from tipping back with blocks behind wheels.        PT Short Term Goals - 10/15/19 1353      PT SHORT TERM GOAL #1   Title  Pt will be I and compliant with  HEP revision. (target for STG 4weeks 11/04/19)    Baseline  was compliant with initial HEP, needs HEP progression for more standing activity at sink    Status  On-going      PT SHORT TERM GOAL #2   Title  Pt will be able to perform sit to stand with min assist with AD.    Baseline  able to show modified with assitive device or with using sink/counter top    Status  Achieved        PT Long Term Goals - 10/26/19 1637      PT LONG TERM GOAL #1   Title  Pt will be I and compliant with final HEP. (target for all LTG 12 weeks 12/23/19)    Status  On-going      PT LONG TERM GOAL #2   Title  Pt will be able to perform sit to stand mod I.    Baseline  min A when standing with RW with w/c, min guard at countertop with BUE support    Status  On-going       PT LONG TERM GOAL #3   Title  Pt will be able perform stand pivot transfers min A to decrease caregiver burden.    Baseline  not tested today but can perfrom lateral scoot transfer independently to min guard    Status  On-going      PT LONG TERM GOAL #4   Title  Pt will be able to tolerate standing for 5 min with UE support.    Baseline  able to stand at countertop today for approx 2-3 minutes at a time with BUE support while performing exercises    Status  On-going      PT LONG TERM GOAL #5   Title  Pt will improve LE strength to at least 4/5 MMT bilat    Baseline  difficult to assess due to cognitive deficits but at least 4/5    Status  Achieved      PT LONG TERM GOAL #6   Title  New goal created 09/30/19. He will be able to ambulate at least 300 ft for improved household ambulation, limited community ambulaiton with no more than min A LRAD.    Baseline  ambulated a total of 175 ft (3 bouts) with mod A and w/c follow for safety    Status  On-going         11/03/19 1405  Plan  Clinical Impression Statement Today's skilled session continued to focus on strengthening and gait with RW. Rest breaks taken as needed with session. The pt is progressing toward goals and should benefit from continued PT to progress toward unmet goals.  Personal Factors and Comorbidities Age;Comorbidity 1;Comorbidity 2;Comorbidity 3+;Past/Current Experience;Fitness  Comorbidities PMH: recent hospitilization 05/22/19 atrial fibrillation, dementia, essential hypertension, HLD, diastolic congestive heart failure, aortic valve regurgitation, BPH, and recent small bowel obstruction with recent surgical intervention with lysis of adhesions  Examination-Activity Limitations  (all ADL's)  Examination-Participation Restrictions  (All)  Pt will benefit from skilled therapeutic intervention in order to improve on the following deficits Decreased balance;Abnormal gait;Decreased endurance;Decreased range of  motion;Decreased strength;Decreased mobility;Difficulty walking;Postural dysfunction;Pain  Stability/Clinical Decision Making Evolving/Moderate complexity  Rehab Potential Fair  PT Frequency Other (comment) (2-3)  PT Duration 12 weeks  PT Treatment/Interventions ADLs/Self Care Home Management;Aquatic Therapy;Cryotherapy;Moist Heat;DME Instruction;Gait training;Functional mobility training;Therapeutic activities;Therapeutic exercise;Balance training;Neuromuscular re-education;Manual techniques;Wheelchair mobility training;Patient/family education;Passive range of motion;Taping  PT Next Visit Plan continue to progress gait with RW, SciFit for warm-up, standing activities at countertop and parallel bars, sit to stands with RW  PT Home Exercise Plan LAQ, sitting marches, sitting hamstring curls, SLR, standing at kitchen sink- UE reaching, marching in place, mini squats  Consulted and Agree with Plan of Care Patient;Family member/caregiver  Family Member Consulted wife          Patient will benefit from skilled therapeutic intervention in order to improve the following deficits and impairments:  Decreased balance, Abnormal gait, Decreased endurance, Decreased range of motion, Decreased strength, Decreased mobility, Difficulty walking, Postural dysfunction, Pain  Visit Diagnosis: Other abnormalities of gait and mobility  Muscle weakness (generalized)     Problem List Patient Active Problem List   Diagnosis Date Noted  . Bacteremia due to Enterococcus 05/24/2019  . Urinary tract infection 05/24/2019  . Chronic diastolic CHF (congestive heart failure) ([place]) 05/22/2019  . Gout 05/22/2019  . Sepsis ([place]) 05/22/2019  . Protein-calorie malnutrition, severe 05/18/2019  . AKI (acute kidney injury) ([place]) 05/05/2019  . Transient hypotension 05/05/2019  . Sepsis, Gram negative ([place]) 03/18/2019  . Hypoalbuminemia   . Leukocytosis   . Hyperglycemia   . Persistent atrial fibrillation with RVR  ([place])   . Acute on chronic anemia   . Rupture of left patellar tendon 02/15/2019  . Postoperative pain   . Bipolar 1 disorder ([place]) 02/14/2019  . Dementia without behavioral disturbance ([place]) 02/14/2019  . Patellar tendon rupture, left, initial encounter 02/10/2019  . SIRS (systemic inflammatory response syndrome) ([place]) 02/09/2019  . Osteomyelitis ([place]) 02/09/2019  . Acute blood loss anemia 02/09/2019  . Aortic valve regurgitation 10/16/2018  . SBO (small bowel obstruction) ([place]) 07/08/2013  . Hyperlipidemia 12/20/2011  . HTN (hypertension) 06/04/2011  . Atrial fibrillation, chronic ([place]) 03/01/2011  . Long term (current) use of anticoagulants 03/01/2011  . GERD 10/11/2009  . DIVERTICULOSIS OF COLON 10/11/2009    Russell Dillon  Johnsie Cancel, [facility] [address] [place], [place] [address] [phone] 11/05/19, 3:45 PM   Name: ACETON KINNEAR MRN: [id number] Date of Birth: [birth year]

## 2019-11-09 ENCOUNTER — Other Ambulatory Visit: Payer: Self-pay

## 2019-11-09 ENCOUNTER — Ambulatory Visit: Payer: Medicare Other | Admitting: Physical Therapy

## 2019-11-09 DIAGNOSIS — M6281 Muscle weakness (generalized): Secondary | ICD-10-CM | POA: Diagnosis not present

## 2019-11-09 DIAGNOSIS — R2689 Other abnormalities of gait and mobility: Secondary | ICD-10-CM | POA: Diagnosis not present

## 2019-11-09 DIAGNOSIS — M25562 Pain in left knee: Secondary | ICD-10-CM | POA: Diagnosis not present

## 2019-11-09 DIAGNOSIS — G8929 Other chronic pain: Secondary | ICD-10-CM | POA: Diagnosis not present

## 2019-11-09 NOTE — Therapy (Signed)
[facility] [address] [place], [place], [address] Phone: [phone]   Fax:  [phone]  Physical Therapy Treatment  Patient Details  Name: Russell Dillon MRN: [id number] Date of Birth: [birth year] Referring Provider (PT): Marton Redwood, MD   Encounter Date: 11/09/2019  PT End of Session - 11/09/19 1454    Visit Number  34    Number of Visits  50   recert for 24 more visits due to making progress   Date for PT Re-Evaluation  09/98/33   recerted on 82/50 for 12 more weeks   Authorization Type  needs KX, MCR, AARP, will need progress note visit 30    PT Start Time  1445    PT Stop Time  1528    PT Time Calculation (min)  43 min    Equipment Utilized During Treatment  Gait belt    Activity Tolerance  Patient tolerated treatment well;No increased pain    Behavior During Therapy  WFL for tasks assessed/performed       Past Medical History:  Diagnosis Date  . Acute metabolic encephalopathy 5/39/7673  . Aortic valve regurgitation 10/16/2018   Echo 07/28/2017:  EF 60-65, moderate AI, aortic root and ascending aorta mildly dilated (40 mm), ascending aorta 43 mm, MAC, mild MR, mild LAE, moderate RV enlargement, trivial TR // Echo 12/19: EF 60-65, normal wall motion, mild AI, moderate BAE, ascending aorta 43 mm, aortic root 39 mm  . BPH (benign prostatic hyperplasia)   . BPH (benign prostatic hypertrophy)   . Chronic anticoagulation   . Chronic atrial fibrillation ([place])   . Diastolic dysfunction October 2010   Normal LV systolic function  . Hx SBO   . Hyperlipidemia   . Hypertension   . Patellar tendon rupture, left, initial encounter 02/10/2019  . Severe sepsis ([place]) 05/05/2019  . Small bowel obstruction [facility])     Past Surgical History:  Procedure Laterality Date  . APPENDECTOMY  2004  . APPLICATION OF WOUND VAC Left 02/11/2019   Procedure: Application Of Wound Vac;  Surgeon: Altamese [place], MD;  Location: [facility];  Service: Orthopedics;  Laterality: Left;  . CARDIOVASCULAR STRESS TEST  09/30/2007   EF 67%  No ischemia.   Marland Kitchen CARDIOVERSION  05/01/2004  . KNEE ARTHROSCOPY Left 02/11/2019   Procedure: ARTHROSCOPY LEFT KNEE;  Surgeon: Altamese [place], MD;  Location: [facility];  Service: Orthopedics;  Laterality: Left;  . LAPAROTOMY N/A 05/11/2019   Procedure: EXPLORATORY LAPAROTOMY;  Surgeon: Rolm Bookbinder, MD;  Location: [facility];  Service: General;  Laterality: N/A;  . LYSIS OF ADHESION N/A 05/11/2019   Procedure: LYSIS OF ADHESION;  Surgeon: Rolm Bookbinder, MD;  Location: [facility];  Service: General;  Laterality: N/A;  . PATELLAR TENDON REPAIR Left 02/11/2019   Procedure: LEFT PATELLA TENDON REPAIR;  Surgeon: Altamese [place], MD;  Location: [facility];  Service: Orthopedics;  Laterality: Left;  . SMALL INTESTINE SURGERY  2004  . US ECHOCARDIOGRAPHY  09/05/2009   ef 55-60%    There were no vitals filed for this visit.  Subjective Assessment - 11/09/19 1452    Subjective  No new complaints - no falls. Pt reports he got a seated stepper for christmas.    Pertinent History  PMH: recent hospitilization 05/22/19 atrial fibrillation, dementia, essential hypertension, HLD, diastolic congestive heart failure, aortic valve regurgitation, BPH, and recent small bowel obstruction with recent surgical intervention with lysis of adhesions    Limitations  Walking;Standing    How  long can you stand comfortably?  can not stand on his own    How long can you walk comfortably?  can not walk on his own    Patient Stated Goals  to walk again    Currently in Pain?  No/denies                       [facility] Adult PT Treatment/Exercise - 11/09/19 0001      Transfers   Transfers  Sit to Stand;Stand to Sit    Sit to Stand  5: Supervision;4: Min guard;With upper extremity assist    Comments  At sink with BUE support: 2 x 5 reps mini squats, with cues and demo for technique, cues to look up at countertop throughout however  pt continued to demonstrate forward flexed posture.       Ambulation/Gait   Ambulation/Gait  Yes    Ambulation/Gait Assistance  4: Min assist;3: Mod assist    Ambulation/Gait Assistance Details  First bout of gait with single PT with min A, occasional mod A for RW navigation/steering as pt tends to push too far anteriorly. Cues for posture throughout gait and trying to spot object 10' ahead. During 2nd bout of gait needed 2nd person for safety when pt was fatigued.     Ambulation Distance (Feet)  76 Feet   x1, 50 x 1   Assistive device  Rolling walker    Gait Pattern  Decreased step length - right;Decreased step length - left;Right flexed knee in stance;Left flexed knee in stance;Trunk flexed;Step-to pattern    Ambulation Surface  Level;Indoor      Exercises   Exercises  Other Exercises    Other Exercises   BUE/LE level 6 for 8 minutes on SciFit with goal of 35 rpm for strengthening, endurance, activity tolerance, and ROM.                PT Short Term Goals - 10/15/19 1353      PT SHORT TERM GOAL #1   Title  Pt will be I and compliant with  HEP revision. (target for STG 4weeks 11/04/19)    Baseline  was compliant with initial HEP, needs HEP progression for more standing activity at sink    Status  On-going      PT SHORT TERM GOAL #2   Title  Pt will be able to perform sit to stand with min assist with AD.    Baseline  able to show modified with assitive device or with using sink/counter top    Status  Achieved        PT Long Term Goals - 10/26/19 1637      PT LONG TERM GOAL #1   Title  Pt will be I and compliant with final HEP. (target for all LTG 12 weeks 12/23/19)    Status  On-going      PT LONG TERM GOAL #2   Title  Pt will be able to perform sit to stand mod I.    Baseline  min A when standing with RW with w/c, min guard at countertop with BUE support    Status  On-going      PT LONG TERM GOAL #3   Title  Pt will be able perform stand pivot transfers min A to  decrease caregiver burden.    Baseline  not tested today but can perfrom lateral scoot transfer independently to min guard    Status  On-going  PT LONG TERM GOAL #4   Title  Pt will be able to tolerate standing for 5 min with UE support.    Baseline  able to stand at countertop today for approx 2-3 minutes at a time with BUE support while performing exercises    Status  On-going      PT LONG TERM GOAL #5   Title  Pt will improve LE strength to at least 4/5 MMT bilat    Baseline  difficult to assess due to cognitive deficits but at least 4/5    Status  Achieved      PT LONG TERM GOAL #6   Title  New goal created 09/30/19. He will be able to ambulate at least 300 ft for improved household ambulation, limited community ambulaiton with no more than min A LRAD.    Baseline  ambulated a total of 175 ft (3 bouts) with mod A and w/c follow for safety    Status  On-going            Plan - 11/09/19 2016    Clinical Impression Statement  Focus of today's skilled session was LE strengthening, standing tolerance, and gait training with RW. Pt ambulated a total of 126' today in 2 separate bouts, first bout with single therapist providing min/mod assist, 2nd bout needing additional 2 person assistance due to increased fatigue. Rest breaks taken between. Pt needing frequent cueing for upright posture and needs assist for RW management as pt tends to push too far anteriorly. Pt is progressing well - will continue to progress towards LTGs.    Personal Factors and Comorbidities  Age;Comorbidity 1;Comorbidity 2;Comorbidity 3+;Past/Current Experience;Fitness    Comorbidities  PMH: recent hospitilization 05/22/19 atrial fibrillation, dementia, essential hypertension, HLD, diastolic congestive heart failure, aortic valve regurgitation, BPH, and recent small bowel obstruction with recent surgical intervention with lysis of adhesions    Examination-Activity Limitations  --   all ADL's    Examination-Participation Restrictions  --   All   Stability/Clinical Decision Making  Evolving/Moderate complexity    Rehab Potential  Fair    PT Frequency  Other (comment)   2-3   PT Duration  12 weeks    PT Treatment/Interventions  ADLs/Self Care Home Management;Aquatic Therapy;Cryotherapy;Moist Heat;DME Instruction;Gait training;Functional mobility training;Therapeutic activities;Therapeutic exercise;Balance training;Neuromuscular re-education;Manual techniques;Wheelchair mobility training;Patient/family education;Passive range of motion;Taping    PT Next Visit Plan  continue to progress gait with RW, SciFit for warm-up, standing activities at countertop and parallel bars, sit to stands with RW    PT Home Exercise Plan  LAQ, sitting marches, sitting hamstring curls, SLR, standing at kitchen sink- UE reaching, marching in place, mini squats    Consulted and Agree with Plan of Care  Patient;Family member/caregiver    Family Member Consulted  wife       Patient will benefit from skilled therapeutic intervention in order to improve the following deficits and impairments:  Decreased balance, Abnormal gait, Decreased endurance, Decreased range of motion, Decreased strength, Decreased mobility, Difficulty walking, Postural dysfunction, Pain  Visit Diagnosis: Muscle weakness (generalized)  Other abnormalities of gait and mobility     Problem List Patient Active Problem List   Diagnosis Date Noted  . Bacteremia due to Enterococcus 05/24/2019  . Urinary tract infection 05/24/2019  . Chronic diastolic CHF (congestive heart failure) ([place]) 05/22/2019  . Gout 05/22/2019  . Sepsis ([place]) 05/22/2019  . Protein-calorie malnutrition, severe 05/18/2019  . AKI (acute kidney injury) ([place]) 05/05/2019  . Transient hypotension 05/05/2019  .  Sepsis, Gram negative ([place]) 03/18/2019  . Hypoalbuminemia   . Leukocytosis   . Hyperglycemia   . Persistent atrial fibrillation with RVR ([place])   . Acute on  chronic anemia   . Rupture of left patellar tendon 02/15/2019  . Postoperative pain   . Bipolar 1 disorder ([place]) 02/14/2019  . Dementia without behavioral disturbance ([place]) 02/14/2019  . Patellar tendon rupture, left, initial encounter 02/10/2019  . SIRS (systemic inflammatory response syndrome) ([place]) 02/09/2019  . Osteomyelitis ([place]) 02/09/2019  . Acute blood loss anemia 02/09/2019  . Aortic valve regurgitation 10/16/2018  . SBO (small bowel obstruction) ([place]) 07/08/2013  . Hyperlipidemia 12/20/2011  . HTN (hypertension) 06/04/2011  . Atrial fibrillation, chronic ([place]) 03/01/2011  . Long term (current) use of anticoagulants 03/01/2011  . GERD 10/11/2009  . DIVERTICULOSIS OF COLON 10/11/2009    Arliss Journey, PT, DPT  11/09/2019, 8:20 PM  [facility] [address] [place], [place], [address] Phone: [phone]   Fax:  [phone]  Name: Russell Dillon MRN: [id number] Date of Birth: [birth year]

## 2019-11-11 ENCOUNTER — Other Ambulatory Visit: Payer: Self-pay

## 2019-11-11 ENCOUNTER — Encounter: Payer: Self-pay | Admitting: Physical Therapy

## 2019-11-11 ENCOUNTER — Ambulatory Visit: Payer: Medicare Other | Admitting: Physical Therapy

## 2019-11-11 DIAGNOSIS — G8929 Other chronic pain: Secondary | ICD-10-CM | POA: Diagnosis not present

## 2019-11-11 DIAGNOSIS — M6281 Muscle weakness (generalized): Secondary | ICD-10-CM

## 2019-11-11 DIAGNOSIS — R2689 Other abnormalities of gait and mobility: Secondary | ICD-10-CM

## 2019-11-11 DIAGNOSIS — M25562 Pain in left knee: Secondary | ICD-10-CM | POA: Diagnosis not present

## 2019-11-11 NOTE — Therapy (Signed)
[facility] [address] [place], [place], [address] Phone: [phone]   Fax:  [phone]  Physical Therapy Treatment  Patient Details  Name: Russell Dillon MRN: [id number] Date of Birth: Dec 05, 1945 Referring Provider (PT): Marton Redwood, MD   Encounter Date: 11/11/2019  PT End of Session - 11/11/19 1404    Visit Number  35    Number of Visits  48   recert for 24 more visits due to making progress   Date for PT Re-Evaluation  37/62/83   recerted on 15/17 for 12 more weeks   Authorization Type  needs KX, MCR, AARP, will need progress note visit 30    PT Start Time  1401    PT Stop Time  1442    PT Time Calculation (min)  41 min    Equipment Utilized During Treatment  Gait belt    Activity Tolerance  Patient tolerated treatment well;No increased pain    Behavior During Therapy  WFL for tasks assessed/performed       Past Medical History:  Diagnosis Date  . Acute metabolic encephalopathy 04/26/736  . Aortic valve regurgitation 10/16/2018   Echo 07/28/2017:  EF 60-65, moderate AI, aortic root and ascending aorta mildly dilated (40 mm), ascending aorta 43 mm, MAC, mild MR, mild LAE, moderate RV enlargement, trivial TR // Echo 12/19: EF 60-65, normal wall motion, mild AI, moderate BAE, ascending aorta 43 mm, aortic root 39 mm  . BPH (benign prostatic hyperplasia)   . BPH (benign prostatic hypertrophy)   . Chronic anticoagulation   . Chronic atrial fibrillation ([place])   . Diastolic dysfunction October 2010   Normal LV systolic function  . Hx SBO   . Hyperlipidemia   . Hypertension   . Patellar tendon rupture, left, initial encounter 02/10/2019  . Severe sepsis ([place]) 05/05/2019  . Small bowel obstruction [facility])     Past Surgical History:  Procedure Laterality Date  . APPENDECTOMY  2004  . APPLICATION OF WOUND VAC Left 02/11/2019   Procedure: Application Of Wound Vac;  Surgeon: Altamese [place], MD;  Location: [facility];  Service: Orthopedics;  Laterality: Left;  . CARDIOVASCULAR STRESS TEST  09/30/2007   EF 67%  No ischemia.   Marland Kitchen CARDIOVERSION  05/01/2004  . KNEE ARTHROSCOPY Left 02/11/2019   Procedure: ARTHROSCOPY LEFT KNEE;  Surgeon: Altamese [place], MD;  Location: [facility];  Service: Orthopedics;  Laterality: Left;  . LAPAROTOMY N/A 05/11/2019   Procedure: EXPLORATORY LAPAROTOMY;  Surgeon: Rolm Bookbinder, MD;  Location: [facility];  Service: General;  Laterality: N/A;  . LYSIS OF ADHESION N/A 05/11/2019   Procedure: LYSIS OF ADHESION;  Surgeon: Rolm Bookbinder, MD;  Location: [facility];  Service: General;  Laterality: N/A;  . PATELLAR TENDON REPAIR Left 02/11/2019   Procedure: LEFT PATELLA TENDON REPAIR;  Surgeon: Altamese [place], MD;  Location: [facility];  Service: Orthopedics;  Laterality: Left;  . SMALL INTESTINE SURGERY  2004  . US ECHOCARDIOGRAPHY  09/05/2009   ef 55-60%    There were no vitals filed for this visit.  Subjective Assessment - 11/11/19 1403    Subjective  No new complaints. No falls or pain to report. [name] reports HEP and standing at sink is going well.    Pertinent History  PMH: recent hospitilization 05/22/19 atrial fibrillation, dementia, essential hypertension, HLD, diastolic congestive heart failure, aortic valve regurgitation, BPH, and recent small bowel obstruction with recent surgical intervention with lysis of adhesions    Limitations  Walking;Standing  How long can you stand comfortably?  can not stand on his own    How long can you walk comfortably?  can not walk on his own    Patient Stated Goals  to walk again    Currently in Pain?  No/denies    Pain Score  0-No pain            [facility] Adult PT Treatment/Exercise - 11/11/19 1404      Transfers   Transfers  Sit to Stand;Stand to Sit    Sit to Stand  4: Min guard;With upper extremity assist;From chair/3-in-1;With armrests    Sit to Stand Details  Verbal cues for technique;Verbal cues for safe use of DME/AE    Sit to Stand  Details (indicate cue type and reason)  reminder cues for correct hand placement with standing    Stand to Sit  4: Min guard;With upper extremity assist;To chair/3-in-1;With armrests    Stand to Sit Details (indicate cue type and reason)  Verbal cues for technique;Verbal cues for safe use of DME/AE    Stand to Sit Details  cues to reach back with sitting down.       Ambulation/Gait   Ambulation/Gait  Yes    Ambulation/Gait Assistance  4: Min assist   2cd person for safety/occasional walker assistance   Ambulation Distance (Feet)  115 Feet   x2, 55 x1   Assistive device  Rolling walker    Gait Pattern  Decreased step length - right;Decreased step length - left;Right flexed knee in stance;Left flexed knee in stance;Trunk flexed;Step-to pattern    Ambulation Surface  Level;Indoor      Knee/Hip Exercises: Aerobic   Other Aerobic  UE/LE level 6.5 for 10 minutes with goal of >/=40 rpm, pt able to maintain a consistent pace without cues today. assist needed to stabilize wheelchair from tipping back with blocks behind wheels.                PT Short Term Goals - 11/11/19 1737      PT SHORT TERM GOAL #1   Title  Pt will be I and compliant with  HEP revision. (target for STG 4weeks 11/04/19)    Baseline  11/11/19: met with current HEP- working on ex's at sink and side stepping at counter    Status  Achieved      PT [place] #2   Title  Pt will be able to perform sit to stand with min assist with AD.    Baseline  11/11/19: met with standing to RW    Status  Achieved        PT Long Term Goals - 10/26/19 1637      PT LONG TERM GOAL #1   Title  Pt will be I and compliant with final HEP. (target for all LTG 12 weeks 12/23/19)    Status  On-going      PT LONG TERM GOAL #2   Title  Pt will be able to perform sit to stand mod I.    Baseline  min A when standing with RW with w/c, min guard at countertop with BUE support    Status  On-going      PT LONG TERM GOAL #3   Title   Pt will be able perform stand pivot transfers min A to decrease caregiver burden.    Baseline  not tested today but can perfrom lateral scoot transfer independently to min guard    Status  On-going  PT LONG TERM GOAL #4   Title  Pt will be able to tolerate standing for 5 min with UE support.    Baseline  able to stand at countertop today for approx 2-3 minutes at a time with BUE support while performing exercises    Status  On-going      PT LONG TERM GOAL #5   Title  Pt will improve LE strength to at least 4/5 MMT bilat    Baseline  difficult to assess due to cognitive deficits but at least 4/5    Status  Achieved      PT LONG TERM GOAL #6   Title  New goal created 09/30/19. He will be able to ambulate at least 300 ft for improved household ambulation, limited community ambulaiton with no more than min A LRAD.    Baseline  ambulated a total of 175 ft (3 bouts) with mod A and w/c follow for safety    Status  On-going            Plan - 11/11/19 1404    Clinical Impression Statement  Today's skilled session continued to focus on strengthening/activity tolerance with use of Scifit. Remainder of session addressed gait with RW. Pt with increased consecutive and overall distances today with less assistance needed. Discussed with spouse having CNA or daughter/son in law come into a session for hands on training with gait so pt can begin to walk at home with RW/assistance. The pt is making steady progress toward goals and should benefit from continued PT to progress toward unmet goals.    Personal Factors and Comorbidities  Age;Comorbidity 1;Comorbidity 2;Comorbidity 3+;Past/Current Experience;Fitness    Comorbidities  PMH: recent hospitilization 05/22/19 atrial fibrillation, dementia, essential hypertension, HLD, diastolic congestive heart failure, aortic valve regurgitation, BPH, and recent small bowel obstruction with recent surgical intervention with lysis of adhesions     Examination-Activity Limitations  --   all ADL's   Examination-Participation Restrictions  --   All   Stability/Clinical Decision Making  Evolving/Moderate complexity    Rehab Potential  Fair    PT Frequency  Other (comment)   2-3   PT Duration  12 weeks    PT Treatment/Interventions  ADLs/Self Care Home Management;Aquatic Therapy;Cryotherapy;Moist Heat;DME Instruction;Gait training;Functional mobility training;Therapeutic activities;Therapeutic exercise;Balance training;Neuromuscular re-education;Manual techniques;Wheelchair mobility training;Patient/family education;Passive range of motion;Taping    PT Next Visit Plan  continue to progress gait with RW, SciFit for warm-up, standing activities at countertop and parallel bars, sit to stands with RW    PT Home Exercise Plan  LAQ, sitting marches, sitting hamstring curls, SLR, standing at kitchen sink- UE reaching, marching in place, mini squats    Consulted and Agree with Plan of Care  Patient;Family member/caregiver    Family Member Consulted  wife       Patient will benefit from skilled therapeutic intervention in order to improve the following deficits and impairments:  Decreased balance, Abnormal gait, Decreased endurance, Decreased range of motion, Decreased strength, Decreased mobility, Difficulty walking, Postural dysfunction, Pain  Visit Diagnosis: Muscle weakness (generalized)  Other abnormalities of gait and mobility     Problem List Patient Active Problem List   Diagnosis Date Noted  . Bacteremia due to Enterococcus 05/24/2019  . Urinary tract infection 05/24/2019  . Chronic diastolic CHF (congestive heart failure) ([place]) 05/22/2019  . Gout 05/22/2019  . Sepsis ([place]) 05/22/2019  . Protein-calorie malnutrition, severe 05/18/2019  . AKI (acute kidney injury) ([place]) 05/05/2019  . Transient hypotension 05/05/2019  . Sepsis,  Gram negative ([place]) 03/18/2019  . Hypoalbuminemia   . Leukocytosis   . Hyperglycemia   .  Persistent atrial fibrillation with RVR ([place])   . Acute on chronic anemia   . Rupture of left patellar tendon 02/15/2019  . Postoperative pain   . Bipolar 1 disorder ([place]) 02/14/2019  . Dementia without behavioral disturbance ([place]) 02/14/2019  . Patellar tendon rupture, left, initial encounter 02/10/2019  . SIRS (systemic inflammatory response syndrome) ([place]) 02/09/2019  . Osteomyelitis ([place]) 02/09/2019  . Acute blood loss anemia 02/09/2019  . Aortic valve regurgitation 10/16/2018  . SBO (small bowel obstruction) ([place]) 07/08/2013  . Hyperlipidemia 12/20/2011  . HTN (hypertension) 06/04/2011  . Atrial fibrillation, chronic ([place]) 03/01/2011  . Long term (current) use of anticoagulants 03/01/2011  . GERD 10/11/2009  . DIVERTICULOSIS OF COLON 10/11/2009    Willow Ora, PTA, [facility] [address], [place], [place] [address] [phone] 11/11/19, 5:38 PM   Name: Russell Dillon MRN: [id number] Date of Birth: 06-21-1946

## 2019-11-16 ENCOUNTER — Ambulatory Visit: Payer: Medicare Other | Attending: Internal Medicine | Admitting: Physical Therapy

## 2019-11-16 ENCOUNTER — Encounter: Payer: Self-pay | Admitting: Physical Therapy

## 2019-11-16 ENCOUNTER — Other Ambulatory Visit: Payer: Self-pay

## 2019-11-16 DIAGNOSIS — R2689 Other abnormalities of gait and mobility: Secondary | ICD-10-CM | POA: Insufficient documentation

## 2019-11-16 DIAGNOSIS — M6281 Muscle weakness (generalized): Secondary | ICD-10-CM | POA: Insufficient documentation

## 2019-11-16 DIAGNOSIS — N390 Urinary tract infection, site not specified: Secondary | ICD-10-CM | POA: Diagnosis not present

## 2019-11-16 DIAGNOSIS — N182 Chronic kidney disease, stage 2 (mild): Secondary | ICD-10-CM | POA: Diagnosis not present

## 2019-11-17 NOTE — Therapy (Signed)
[facility] [address] [place], [place], [address] Phone: [phone]   Fax:  [phone]  Physical Therapy Treatment  Patient Details  Name: Russell Dillon MRN: [id number] Date of Birth: 1946/07/17 Referring Provider (PT): Marton Redwood, MD   Encounter Date: 11/16/2019  PT End of Session - 11/16/19 1452    Visit Number  36    Number of Visits  63   recert for 24 more visits due to making progress   Date for PT Re-Evaluation  75/17/00   recerted on 17/49 for 12 more weeks   Authorization Type  needs KX, MCR, AARP, will need progress note visit 30    PT Start Time  1447    PT Stop Time  1527    PT Time Calculation (min)  40 min    Equipment Utilized During Treatment  Gait belt    Activity Tolerance  Patient tolerated treatment well;No increased pain    Behavior During Therapy  WFL for tasks assessed/performed       Past Medical History:  Diagnosis Date  . Acute metabolic encephalopathy 4/49/6759  . Aortic valve regurgitation 10/16/2018   Echo 07/28/2017:  EF 60-65, moderate AI, aortic root and ascending aorta mildly dilated (40 mm), ascending aorta 43 mm, MAC, mild MR, mild LAE, moderate RV enlargement, trivial TR // Echo 12/19: EF 60-65, normal wall motion, mild AI, moderate BAE, ascending aorta 43 mm, aortic root 39 mm  . BPH (benign prostatic hyperplasia)   . BPH (benign prostatic hypertrophy)   . Chronic anticoagulation   . Chronic atrial fibrillation ([place])   . Diastolic dysfunction October 2010   Normal LV systolic function  . Hx SBO   . Hyperlipidemia   . Hypertension   . Patellar tendon rupture, left, initial encounter 02/10/2019  . Severe sepsis ([place]) 05/05/2019  . Small bowel obstruction [facility])     Past Surgical History:  Procedure Laterality Date  . APPENDECTOMY  2004  . APPLICATION OF WOUND VAC Left 02/11/2019   Procedure: Application Of Wound Vac;  Surgeon: Altamese [place], MD;  Location: [facility];   Service: Orthopedics;  Laterality: Left;  . CARDIOVASCULAR STRESS TEST  09/30/2007   EF 67%  No ischemia.   Marland Kitchen CARDIOVERSION  05/01/2004  . KNEE ARTHROSCOPY Left 02/11/2019   Procedure: ARTHROSCOPY LEFT KNEE;  Surgeon: Altamese [place], MD;  Location: [place] [facility];  Service: Orthopedics;  Laterality: Left;  . LAPAROTOMY N/A 05/11/2019   Procedure: EXPLORATORY LAPAROTOMY;  Surgeon: Rolm Bookbinder, MD;  Location: [facility];  Service: General;  Laterality: N/A;  . LYSIS OF ADHESION N/A 05/11/2019   Procedure: LYSIS OF ADHESION;  Surgeon: Rolm Bookbinder, MD;  Location: [facility];  Service: General;  Laterality: N/A;  . PATELLAR TENDON REPAIR Left 02/11/2019   Procedure: LEFT PATELLA TENDON REPAIR;  Surgeon: Altamese [place], MD;  Location: [facility];  Service: Orthopedics;  Laterality: Left;  . SMALL INTESTINE SURGERY  2004  . US ECHOCARDIOGRAPHY  09/05/2009   ef 55-60%    There were no vitals filed for this visit.  Subjective Assessment - 11/16/19 1450    Subjective  [name] reports his urine was cloudy so she took a sample to MD office today. He has a UTI and will start Augmentin today. He has not been running a fever, has been a little lethargic per spouse report. Did started walking with CNA (about 2-3 times) at home with RW since last session.    Pertinent History  PMH: recent  hospitilization 05/22/19 atrial fibrillation, dementia, essential hypertension, HLD, diastolic congestive heart failure, aortic valve regurgitation, BPH, and recent small bowel obstruction with recent surgical intervention with lysis of adhesions    Limitations  Walking;Standing    How long can you stand comfortably?  can not stand on his own    How long can you walk comfortably?  can not walk on his own    Patient Stated Goals  to walk again    Currently in Pain?  No/denies    Pain Score  0-No pain            [facility] Adult PT Treatment/Exercise - 11/16/19 1452      Transfers   Transfers  Sit to Stand;Stand to Sit    Sit to Stand   4: Min guard;With upper extremity assist;From chair/3-in-1;With armrests    Sit to Stand Details  Verbal cues for technique;Verbal cues for safe use of DME/AE    Sit to Stand Details (indicate cue type and reason)  reminder cues to push from wheelchair then hold RW    Stand to Sit  4: Min guard;With upper extremity assist;To chair/3-in-1;With armrests    Stand to Sit Details (indicate cue type and reason)  Verbal cues for technique;Verbal cues for safe use of DME/AE    Stand to Sit Details  reminder cues to reach back with sitting down      Ambulation/Gait   Ambulation/Gait  Yes    Ambulation/Gait Assistance  4: Min assist;3: Mod assist   mod assist of 1 as pt fatigued with 3rd lap   Ambulation/Gait Assistance Details  cues on posture and walker position with gait, assist needed to hold walker back with gait as pt tends to push it too far foward.    Ambulation Distance (Feet)  115 Feet   x3   Assistive device  Rolling walker    Gait Pattern  Step-through pattern;Decreased stride length;Trunk flexed;Narrow base of support    Ambulation Surface  Level;Indoor      Knee/Hip Exercises: Aerobic   Other Aerobic  UE/LE level 6.5 for 10 minutes with goal of >/=40 rpm, pt able to maintain a consistent pace without cues today. assist needed to stabilize wheelchair from tipping back with blocks behind wheels.           PT Short Term Goals - 11/11/19 1737      PT SHORT TERM GOAL #1   Title  Pt will be I and compliant with  HEP revision. (target for STG 4weeks 11/04/19)    Baseline  11/11/19: met with current HEP- working on ex's at sink and side stepping at counter    Status  Achieved      PT [place] #2   Title  Pt will be able to perform sit to stand with min assist with AD.    Baseline  11/11/19: met with standing to RW    Status  Achieved        PT Long Term Goals - 10/26/19 1637      PT LONG TERM GOAL #1   Title  Pt will be I and compliant with final HEP. (target for all  LTG 12 weeks 12/23/19)    Status  On-going      PT LONG TERM GOAL #2   Title  Pt will be able to perform sit to stand mod I.    Baseline  min A when standing with RW with w/c, min guard at countertop with BUE support  Status  On-going      PT LONG TERM GOAL #3   Title  Pt will be able perform stand pivot transfers min A to decrease caregiver burden.    Baseline  not tested today but can perfrom lateral scoot transfer independently to min guard    Status  On-going      PT LONG TERM GOAL #4   Title  Pt will be able to tolerate standing for 5 min with UE support.    Baseline  able to stand at countertop today for approx 2-3 minutes at a time with BUE support while performing exercises    Status  On-going      PT LONG TERM GOAL #5   Title  Pt will improve LE strength to at least 4/5 MMT bilat    Baseline  difficult to assess due to cognitive deficits but at least 4/5    Status  Achieved      PT LONG TERM GOAL #6   Title  New goal created 09/30/19. He will be able to ambulate at least 300 ft for improved household ambulation, limited community ambulaiton with no more than min A LRAD.    Baseline  ambulated a total of 175 ft (3 bouts) with mod A and w/c follow for safety    Status  On-going            Plan - 11/16/19 1452    Clinical Impression Statement  Today's skilled session continued to focus on strengthening, activity tolerance and gait with RW. Pt with increased overall distance this session with less assistance needed. The pt is progressing toward goals and should benefit from continued PT to progress toward unmet goals.    Personal Factors and Comorbidities  Age;Comorbidity 1;Comorbidity 2;Comorbidity 3+;Past/Current Experience;Fitness    Comorbidities  PMH: recent hospitilization 05/22/19 atrial fibrillation, dementia, essential hypertension, HLD, diastolic congestive heart failure, aortic valve regurgitation, BPH, and recent small bowel obstruction with recent surgical  intervention with lysis of adhesions    Examination-Activity Limitations  --   all ADL's   Examination-Participation Restrictions  --   All   Stability/Clinical Decision Making  Evolving/Moderate complexity    Rehab Potential  Fair    PT Frequency  Other (comment)   2-3   PT Duration  12 weeks    PT Treatment/Interventions  ADLs/Self Care Home Management;Aquatic Therapy;Cryotherapy;Moist Heat;DME Instruction;Gait training;Functional mobility training;Therapeutic activities;Therapeutic exercise;Balance training;Neuromuscular re-education;Manual techniques;Wheelchair mobility training;Patient/family education;Passive range of motion;Taping    PT Next Visit Plan  continue with strengthening on Scifit, gait with RW, work on stairs with 2 person assist (have stool at top for pt to rest on before climbing down after going up)    PT [place], sitting marches, sitting hamstring curls, SLR, standing at kitchen sink- UE reaching, marching in place, mini squats    Consulted and Agree with Plan of Care  Patient;Family member/caregiver    Family Member Consulted  wife       Patient will benefit from skilled therapeutic intervention in order to improve the following deficits and impairments:  Decreased balance, Abnormal gait, Decreased endurance, Decreased range of motion, Decreased strength, Decreased mobility, Difficulty walking, Postural dysfunction, Pain  Visit Diagnosis: Muscle weakness (generalized)  Other abnormalities of gait and mobility     Problem List Patient Active Problem List   Diagnosis Date Noted  . Bacteremia due to Enterococcus 05/24/2019  . Urinary tract infection 05/24/2019  . Chronic diastolic CHF (congestive heart failure) ([place]) 05/22/2019  .  Gout 05/22/2019  . Sepsis ([place]) 05/22/2019  . Protein-calorie malnutrition, severe 05/18/2019  . AKI (acute kidney injury) ([place]) 05/05/2019  . Transient hypotension 05/05/2019  . Sepsis, Gram negative ([place])  03/18/2019  . Hypoalbuminemia   . Leukocytosis   . Hyperglycemia   . Persistent atrial fibrillation with RVR ([place])   . Acute on chronic anemia   . Rupture of left patellar tendon 02/15/2019  . Postoperative pain   . Bipolar 1 disorder ([place]) 02/14/2019  . Dementia without behavioral disturbance ([place]) 02/14/2019  . Patellar tendon rupture, left, initial encounter 02/10/2019  . SIRS (systemic inflammatory response syndrome) ([place]) 02/09/2019  . Osteomyelitis ([place]) 02/09/2019  . Acute blood loss anemia 02/09/2019  . Aortic valve regurgitation 10/16/2018  . SBO (small bowel obstruction) ([place]) 07/08/2013  . Hyperlipidemia 12/20/2011  . HTN (hypertension) 06/04/2011  . Atrial fibrillation, chronic ([place]) 03/01/2011  . Long term (current) use of anticoagulants 03/01/2011  . GERD 10/11/2009  . DIVERTICULOSIS OF COLON 10/11/2009    Willow Ora, PTA, [facility] [address] [place], [place] [address] [phone] 11/17/19, 9:37 PM   Name: Russell Dillon MRN: [id number] Date of Birth: February 23, 1946

## 2019-11-19 ENCOUNTER — Ambulatory Visit: Payer: Medicare Other | Admitting: Physical Therapy

## 2019-11-19 ENCOUNTER — Ambulatory Visit: Payer: Medicare Other

## 2019-11-19 ENCOUNTER — Other Ambulatory Visit: Payer: Self-pay

## 2019-11-19 DIAGNOSIS — M6281 Muscle weakness (generalized): Secondary | ICD-10-CM

## 2019-11-19 DIAGNOSIS — R2689 Other abnormalities of gait and mobility: Secondary | ICD-10-CM

## 2019-11-19 NOTE — Therapy (Signed)
[facility] [address] [place], [place], [address] Phone: [phone]   Fax:  [phone]  Physical Therapy Treatment  Patient Details  Name: Russell Dillon MRN: [id number] Date of Birth: [birth year] Referring Provider (PT): Marton Redwood, MD   Encounter Date: 11/19/2019  PT End of Session - 11/19/19 1416    Visit Number  37    Number of Visits  82   recert for 24 more visits due to making progress   Date for PT Re-Evaluation  00/86/76   recerted on 19/50 for 12 more weeks   Authorization Type  needs KX, MCR, AARP, will need progress note visit 30    PT Start Time  1401    PT Stop Time  1443    PT Time Calculation (min)  42 min    Equipment Utilized During Treatment  Gait belt    Activity Tolerance  Patient tolerated treatment well;No increased pain    Behavior During Therapy  WFL for tasks assessed/performed       Past Medical History:  Diagnosis Date  . Acute metabolic encephalopathy 9/32/6712  . Aortic valve regurgitation 10/16/2018   Echo 07/28/2017:  EF 60-65, moderate AI, aortic root and ascending aorta mildly dilated (40 mm), ascending aorta 43 mm, MAC, mild MR, mild LAE, moderate RV enlargement, trivial TR // Echo 12/19: EF 60-65, normal wall motion, mild AI, moderate BAE, ascending aorta 43 mm, aortic root 39 mm  . BPH (benign prostatic hyperplasia)   . BPH (benign prostatic hypertrophy)   . Chronic anticoagulation   . Chronic atrial fibrillation ([place])   . Diastolic dysfunction October 2010   Normal LV systolic function  . Hx SBO   . Hyperlipidemia   . Hypertension   . Patellar tendon rupture, left, initial encounter 02/10/2019  . Severe sepsis ([place]) 05/05/2019  . Small bowel obstruction [facility])     Past Surgical History:  Procedure Laterality Date  . APPENDECTOMY  2004  . APPLICATION OF WOUND VAC Left 02/11/2019   Procedure: Application Of Wound Vac;  Surgeon: Altamese Clarkdale, MD;  Location: [facility];   Service: Orthopedics;  Laterality: Left;  . CARDIOVASCULAR STRESS TEST  09/30/2007   EF 67%  No ischemia.   Marland Kitchen CARDIOVERSION  05/01/2004  . KNEE ARTHROSCOPY Left 02/11/2019   Procedure: ARTHROSCOPY LEFT KNEE;  Surgeon: Altamese Elbe, MD;  Location: [facility];  Service: Orthopedics;  Laterality: Left;  . LAPAROTOMY N/A 05/11/2019   Procedure: EXPLORATORY LAPAROTOMY;  Surgeon: Rolm Bookbinder, MD;  Location: [facility];  Service: General;  Laterality: N/A;  . LYSIS OF ADHESION N/A 05/11/2019   Procedure: LYSIS OF ADHESION;  Surgeon: Rolm Bookbinder, MD;  Location: [facility];  Service: General;  Laterality: N/A;  . PATELLAR TENDON REPAIR Left 02/11/2019   Procedure: LEFT PATELLA TENDON REPAIR;  Surgeon: Altamese Keenes, MD;  Location: [facility];  Service: Orthopedics;  Laterality: Left;  . SMALL INTESTINE SURGERY  2004  . US ECHOCARDIOGRAPHY  09/05/2009   ef 55-60%    There were no vitals filed for this visit.  Subjective Assessment - 11/19/19 1418    Subjective  Pt has 3 more days of antibiotic left for UTI. Pt reports he has been feeling good.    Pertinent History  PMH: recent hospitilization 05/22/19 atrial fibrillation, dementia, essential hypertension, HLD, diastolic congestive heart failure, aortic valve regurgitation, BPH, and recent small bowel obstruction with recent surgical intervention with lysis of adhesions    Limitations  Walking;Standing  How long can you stand comfortably?  can not stand on his own    How long can you walk comfortably?  can not walk on his own    Patient Stated Goals  to walk again    Currently in Pain?  No/denies                       [facility] Adult PT Treatment/Exercise - 11/19/19 1419      Transfers   Transfers  Sit to Stand;Stand to Sit    Sit to Stand  4: Min guard    Sit to Stand Details  Verbal cues for technique    Sit to Stand Details (indicate cue type and reason)  Pt needed verbal reminders to push from chair and bring feet back prior to rising.     Stand to Sit  4: Min guard    Stand to Sit Details (indicate cue type and reason)  Verbal cues for technique    Stand to Sit Details  Verbal cues to reach back prior to sitting to help control descent    Comments  Pt performed sit to stand from w/c x 5 with verbal cues for hand placement and to stand erect each time. CGA.      Ambulation/Gait   Ambulation/Gait  Yes    Ambulation/Gait Assistance  4: Min guard    Ambulation/Gait Assistance Details  Verbal cues to stay up in walker and try to keep head up. During second bout PT had patient get erect standing posture first using cuing to stay taller than the PT which seemed to help more with patient keeping head and chest up.     Ambulation Distance (Feet)  345 Feet   115 x 1, 50 x 1.   Assistive device  Rolling walker   wife followed with w/c   Gait Pattern  Step-through pattern;Narrow base of support;Trunk flexed    Ambulation Surface  Level;Indoor      Knee/Hip Exercises: Aerobic   Other Aerobic  Sci Fit 10 min level 6.5 with PT stabilizing chair. Pt utilized both arms and legs keeping good pace throughout and able to carrry on conversation.             PT Education - 11/19/19 1500    Education Details  Pt to continue with current HEP with caregiver assist.    Person(s) Educated  Patient;Spouse    Methods  Explanation    Comprehension  Verbalized understanding       PT Short Term Goals - 11/11/19 1737      PT SHORT TERM GOAL #1   Title  Pt will be I and compliant with  HEP revision. (target for STG 4weeks 11/04/19)    Baseline  11/11/19: met with current HEP- working on ex's at sink and side stepping at counter    Status  Achieved      PT [place] #2   Title  Pt will be able to perform sit to stand with min assist with AD.    Baseline  11/11/19: met with standing to RW    Status  Achieved        PT Long Term Goals - 10/26/19 1637      PT LONG TERM GOAL #1   Title  Pt will be I and compliant with final  HEP. (target for all LTG 12 weeks 12/23/19)    Status  On-going      PT LONG TERM GOAL #  2   Title  Pt will be able to perform sit to stand mod I.    Baseline  min A when standing with RW with w/c, min guard at countertop with BUE support    Status  On-going      PT LONG TERM GOAL #3   Title  Pt will be able perform stand pivot transfers min A to decrease caregiver burden.    Baseline  not tested today but can perfrom lateral scoot transfer independently to min guard    Status  On-going      PT LONG TERM GOAL #4   Title  Pt will be able to tolerate standing for 5 min with UE support.    Baseline  able to stand at countertop today for approx 2-3 minutes at a time with BUE support while performing exercises    Status  On-going      PT LONG TERM GOAL #5   Title  Pt will improve LE strength to at least 4/5 MMT bilat    Baseline  difficult to assess due to cognitive deficits but at least 4/5    Status  Achieved      PT LONG TERM GOAL #6   Title  New goal created 09/30/19. He will be able to ambulate at least 300 ft for improved household ambulation, limited community ambulaiton with no more than min A LRAD.    Baseline  ambulated a total of 175 ft (3 bouts) with mod A and w/c follow for safety    Status  On-going            Plan - 11/19/19 1501    Clinical Impression Statement  Pt was able to increase gait distance significantly today with more fluid, reciprocal pattern. Focused more on trying to get more upright posture.    Personal Factors and Comorbidities  Age;Comorbidity 1;Comorbidity 2;Comorbidity 3+;Past/Current Experience;Fitness    Comorbidities  PMH: recent hospitilization 05/22/19 atrial fibrillation, dementia, essential hypertension, HLD, diastolic congestive heart failure, aortic valve regurgitation, BPH, and recent small bowel obstruction with recent surgical intervention with lysis of adhesions    Examination-Activity Limitations  --   all ADL's    Examination-Participation Restrictions  --   All   Stability/Clinical Decision Making  Evolving/Moderate complexity    Rehab Potential  Fair    PT Frequency  Other (comment)   2-3   PT Duration  12 weeks    PT Treatment/Interventions  ADLs/Self Care Home Management;Aquatic Therapy;Cryotherapy;Moist Heat;DME Instruction;Gait training;Functional mobility training;Therapeutic activities;Therapeutic exercise;Balance training;Neuromuscular re-education;Manual techniques;Wheelchair mobility training;Patient/family education;Passive range of motion;Taping    PT Next Visit Plan  continue with strengthening on Scifit, gait with RW, work on stairs with 2 person assist (have stool at top for pt to rest on before climbing down after going up)    PT [place], sitting marches, sitting hamstring curls, SLR, standing at kitchen sink- UE reaching, marching in place, mini squats    Consulted and Agree with Plan of Care  Patient;Family member/caregiver    Family Member Consulted  wife       Patient will benefit from skilled therapeutic intervention in order to improve the following deficits and impairments:  Decreased balance, Abnormal gait, Decreased endurance, Decreased range of motion, Decreased strength, Decreased mobility, Difficulty walking, Postural dysfunction, Pain  Visit Diagnosis: Muscle weakness (generalized)  Other abnormalities of gait and mobility     Problem List Patient Active Problem List   Diagnosis Date Noted  . Bacteremia  due to Enterococcus 05/24/2019  . Urinary tract infection 05/24/2019  . Chronic diastolic CHF (congestive heart failure) ([place]) 05/22/2019  . Gout 05/22/2019  . Sepsis ([place]) 05/22/2019  . Protein-calorie malnutrition, severe 05/18/2019  . AKI (acute kidney injury) ([facility]) 05/05/2019  . Transient hypotension 05/05/2019  . Sepsis, Gram negative ([place]) 03/18/2019  . Hypoalbuminemia   . Leukocytosis   . Hyperglycemia   . Persistent atrial  fibrillation with RVR ([place])   . Acute on chronic anemia   . Rupture of left patellar tendon 02/15/2019  . Postoperative pain   . Bipolar 1 disorder ([place]) 02/14/2019  . Dementia without behavioral disturbance ([place]) 02/14/2019  . Patellar tendon rupture, left, initial encounter 02/10/2019  . SIRS (systemic inflammatory response syndrome) ([place]) 02/09/2019  . Osteomyelitis ([place]) 02/09/2019  . Acute blood loss anemia 02/09/2019  . Aortic valve regurgitation 10/16/2018  . SBO (small bowel obstruction) ([place]) 07/08/2013  . Hyperlipidemia 12/20/2011  . HTN (hypertension) 06/04/2011  . Atrial fibrillation, chronic ([place]) 03/01/2011  . Long term (current) use of anticoagulants 03/01/2011  . GERD 10/11/2009  . DIVERTICULOSIS OF COLON 10/11/2009    Electa Sniff, PT, DPT, NCS 11/19/2019, 3:02 PM  [facility] [address] [place], [place], [address] Phone: [phone]   Fax:  [phone]  Name: OLUWATIMILEHIN BALFOUR MRN: [id number] Date of Birth: 08/31/1946

## 2019-11-24 ENCOUNTER — Ambulatory Visit: Payer: Medicare Other

## 2019-11-26 ENCOUNTER — Ambulatory Visit: Payer: Medicare Other

## 2019-11-30 ENCOUNTER — Ambulatory Visit: Payer: Medicare Other | Admitting: Physical Therapy

## 2019-11-30 ENCOUNTER — Other Ambulatory Visit: Payer: Self-pay | Admitting: Psychiatry

## 2019-12-02 NOTE — Telephone Encounter (Signed)
Call to RS

## 2019-12-02 NOTE — Telephone Encounter (Signed)
Last apt I can see is 12/2018

## 2019-12-03 ENCOUNTER — Ambulatory Visit: Payer: Medicare Other | Admitting: Physical Therapy

## 2019-12-04 ENCOUNTER — Ambulatory Visit: Payer: Medicare Other

## 2019-12-07 ENCOUNTER — Ambulatory Visit: Payer: Medicare Other | Admitting: Physical Therapy

## 2019-12-08 ENCOUNTER — Other Ambulatory Visit: Payer: Medicare Other | Admitting: Licensed Clinical Social Worker

## 2019-12-08 ENCOUNTER — Other Ambulatory Visit: Payer: Self-pay

## 2019-12-08 DIAGNOSIS — Z515 Encounter for palliative care: Secondary | ICD-10-CM

## 2019-12-10 ENCOUNTER — Ambulatory Visit: Payer: Medicare Other | Admitting: Physical Therapy

## 2019-12-10 DIAGNOSIS — D6832 Hemorrhagic disorder due to extrinsic circulating anticoagulants: Secondary | ICD-10-CM | POA: Diagnosis not present

## 2019-12-10 DIAGNOSIS — Z7901 Long term (current) use of anticoagulants: Secondary | ICD-10-CM | POA: Diagnosis not present

## 2019-12-10 DIAGNOSIS — I480 Paroxysmal atrial fibrillation: Secondary | ICD-10-CM | POA: Diagnosis not present

## 2019-12-10 DIAGNOSIS — E538 Deficiency of other specified B group vitamins: Secondary | ICD-10-CM | POA: Diagnosis not present

## 2019-12-11 NOTE — Progress Notes (Signed)
COMMUNITY PALLIATIVE CARE SW NOTE  PATIENT NAME: Russell Dillon DOB: Mar 18, 1946 MRN: [id number]  PRIMARY CARE PROVIDER: Marton Redwood, MD  RESPONSIBLE PARTY:  Acct ID - Guarantor Home Phone Work Phone Relationship Acct Type  [id number] Buryl Schneiders,* [phone]  Self P/F     [address], [place], [place] [address]   Due to the COVID-19 crisis, this virtual check-in visit was done via telephone from my office and it was initiated and consent given by thispatient.  PLAN OF CARE and INTERVENTIONS:             1. GOALS OF CARE/ ADVANCE CARE PLANNING:  The goal for patient is to have the best quality of life.  He is a full code. 2. SOCIAL/EMOTIONAL/SPIRITUAL ASSESSMENT/ INTERVENTIONS:  SW conducted a Sales executive visit with patient's son-in-law, Merry Proud.  He reports patient's wife, [name], is currently in the hospital due to a COVID diagnosis.  Patient was also tested positive, but has displayed no symptoms.  Their home health agency has suspended services due to [place].  Merry Proud and patient's daughter, [name], are currently caring for patient.  SW provided active listening and supportive counseling. 3. PATIENT/CAREGIVER EDUCATION/ COPING:  Family copes by problem-solving. 4. PERSONAL EMERGENCY PLAN:  EMS will be contacts. 5. COMMUNITY RESOURCES COORDINATION/ HEALTH CARE NAVIGATION:  Patient receives PT. 6. FINANCIAL/LEGAL CONCERNS/INTERVENTIONS:  None.     SOCIAL HX:  Social History   Tobacco Use  . Smoking status: Never Smoker  . Smokeless tobacco: Never Used  Substance Use Topics  . Alcohol use: No    Alcohol/week: 0.0 standard drinks    CODE STATUS:  Full Code  ADVANCED DIRECTIVES: N MOST FORM COMPLETE:  N HOSPICE EDUCATION PROVIDED:  N PPS:  Appetite is normal.  He uses a walker with assistance. Duration of visit and documentation:  30 minutes.      Creola Corn Jazon Jipson, LCSW

## 2019-12-22 ENCOUNTER — Ambulatory Visit: Payer: Medicare Other | Attending: Internal Medicine | Admitting: Physical Therapy

## 2019-12-22 ENCOUNTER — Other Ambulatory Visit: Payer: Self-pay

## 2019-12-22 DIAGNOSIS — M6281 Muscle weakness (generalized): Secondary | ICD-10-CM | POA: Diagnosis not present

## 2019-12-22 DIAGNOSIS — R2689 Other abnormalities of gait and mobility: Secondary | ICD-10-CM | POA: Diagnosis not present

## 2019-12-22 DIAGNOSIS — L0109 Other impetigo: Secondary | ICD-10-CM | POA: Diagnosis not present

## 2019-12-22 DIAGNOSIS — L089 Local infection of the skin and subcutaneous tissue, unspecified: Secondary | ICD-10-CM | POA: Diagnosis not present

## 2019-12-22 NOTE — Therapy (Signed)
[facility] [address] [place], [place], [address] Phone: [phone]   Fax:  [phone]  Physical Therapy Treatment/Re-Cert  Patient Details  Name: Russell Dillon MRN: [id number] Date of Birth: [birth year] Referring Provider (PT): Marton Redwood, MD   Encounter Date: 12/22/2019  PT End of Session - 12/22/19 1526    Visit Number  38    Number of Visits  45   recert for 16 more visits due to progress   Date for PT Re-Evaluation  03/21/20   written for 60 day POC   Authorization Type  needs KX, MCR, AARP, will need progress note visit 30    PT Start Time  1316    PT Stop Time  1400    PT Time Calculation (min)  44 min    Equipment Utilized During Treatment  Gait belt    Activity Tolerance  Patient tolerated treatment well;No increased pain    Behavior During Therapy  WFL for tasks assessed/performed       Past Medical History:  Diagnosis Date  . Acute metabolic encephalopathy 9/79/8921  . Aortic valve regurgitation 10/16/2018   Echo 07/28/2017:  EF 60-65, moderate AI, aortic root and ascending aorta mildly dilated (40 mm), ascending aorta 43 mm, MAC, mild MR, mild LAE, moderate RV enlargement, trivial TR // Echo 12/19: EF 60-65, normal wall motion, mild AI, moderate BAE, ascending aorta 43 mm, aortic root 39 mm  . BPH (benign prostatic hyperplasia)   . BPH (benign prostatic hypertrophy)   . Chronic anticoagulation   . Chronic atrial fibrillation ([place])   . Diastolic dysfunction October 2010   Normal LV systolic function  . Hx SBO   . Hyperlipidemia   . Hypertension   . Patellar tendon rupture, left, initial encounter 02/10/2019  . Severe sepsis ([place]) 05/05/2019  . Small bowel obstruction [facility])     Past Surgical History:  Procedure Laterality Date  . APPENDECTOMY  2004  . APPLICATION OF WOUND VAC Left 02/11/2019   Procedure: Application Of Wound Vac;  Surgeon: Altamese Mililani Mauka, MD;  Location: [facility];  Service:  Orthopedics;  Laterality: Left;  . CARDIOVASCULAR STRESS TEST  09/30/2007   EF 67%  No ischemia.   Marland Kitchen CARDIOVERSION  05/01/2004  . KNEE ARTHROSCOPY Left 02/11/2019   Procedure: ARTHROSCOPY LEFT KNEE;  Surgeon: Altamese [place]onia, MD;  Location: [facility];  Service: Orthopedics;  Laterality: Left;  . LAPAROTOMY N/A 05/11/2019   Procedure: EXPLORATORY LAPAROTOMY;  Surgeon: Rolm Bookbinder, MD;  Location: [facility];  Service: General;  Laterality: N/A;  . LYSIS OF ADHESION N/A 05/11/2019   Procedure: LYSIS OF ADHESION;  Surgeon: Rolm Bookbinder, MD;  Location: [facility];  Service: General;  Laterality: N/A;  . PATELLAR TENDON REPAIR Left 02/11/2019   Procedure: LEFT PATELLA TENDON REPAIR;  Surgeon: Altamese Oblong, MD;  Location: [facility];  Service: Orthopedics;  Laterality: Left;  . SMALL INTESTINE SURGERY  2004  . US ECHOCARDIOGRAPHY  09/05/2009   ef 55-60%    There were no vitals filed for this visit.  Subjective Assessment - 12/22/19 1540    Subjective  Had to be out because tested positive for Covid, had a very mild case. Has still been walking and doing his exercises at home.    Pertinent History  PMH: recent hospitilization 05/22/19 atrial fibrillation, dementia, essential hypertension, HLD, diastolic congestive heart failure, aortic valve regurgitation, BPH, and recent small bowel obstruction with recent surgical intervention with lysis of adhesions    Limitations  Walking;Standing    How long can you stand comfortably?  can not stand on his own    How long can you walk comfortably?  can not walk on his own    Patient Stated Goals  to walk again    Currently in Pain?  No/denies             12/22/19 0001  Transfers  Transfers Sit to Stand;Stand to Sit  Sit to Stand 4: Min guard;4: Min assist  Sit to Stand Details Verbal cues for technique;Tactile cues for placement  Sit to Stand Details (indicate cue type and reason) from w/c, pt needing initial cues for proper UE placement on RW and w/c  Stand  to Sit 4: Min guard  Stand to Sit Details (indicate cue type and reason) Verbal cues for technique  Stand to Sit Details cues to reach back posteriorly to walker to help control descent  Comments needed min A and verbal cues for stand step transfer/ performing a turn after descending stairs and coming back to sit in RW  Ambulation/Gait  Ambulation/Gait Yes  Ambulation/Gait Assistance 4: Min guard;4: Min assist  Ambulation/Gait Assistance Details during initial lap pt needing min A and cues to help keep RW closer to body as pt with tendency to push too far anteriorly, during 2nd bout of gait put green theraband around RW and posteriorly around pt to help with upright posture and staying in RW - during 2nd bout pt demonstrating improved gait mechanics and upright posture only requiring min guard. needing seated rest break between each bout of gait.    Ambulation Distance (Feet) 350 Feet (x1, 203 x 1)  Assistive device Rolling walker;Other (Comment) (w/c follow)  Gait Pattern Step-through pattern;Narrow base of support;Trunk flexed  Ambulation Surface Level;Indoor  Gait velocity 2.11 ft/sec   Stairs Yes  Stairs Assistance 4: Min assist;3: Mod assist  Stairs Assistance Details (indicate cue type and reason) cues for proper technique (ascending with stronger RLE and stepping down first with weaker LLE) - needing min/mod A for descending down stairs due to balance, pt with almost losing balance posteriorly at one point (pt not moving BUE down railings - needed verbal and tactile cues to move hands down railing).  Stair Management Technique Two rails;Step to pattern;Forwards  Number of Stairs 4  Height of Stairs 6  Knee/Hip Exercises: Aerobic  Other Aerobic SciFit level 5.0 for 6 minutes with BLE and BUE - PT helping to stabilize chair                      PT Education - 12/22/19 1540    Education Details  progress towards goals, POC    Person(s) Educated  Patient   pt's  daughter   Methods  Explanation    Comprehension  Verbalized understanding       PT Short Term Goals - 11/11/19 1737      PT SHORT TERM GOAL #1   Title  Pt will be I and compliant with  HEP revision. (target for STG 4weeks 11/04/19)    Baseline  11/11/19: met with current HEP- working on ex's at sink and side stepping at counter    Status  Achieved      PT [place] #2   Title  Pt will be able to perform sit to stand with min assist with AD.    Baseline  11/11/19: met with standing to RW    Status  Achieved  Revised STGs for re-cert:  PT Short Term Goals - 12/22/19 1544      PT SHORT TERM GOAL #1   Title  Pt will be able to ambulate at least 300' with RW with supervision in order to improve mobility. ALL STGS DUE 01/19/20    Baseline  350' with min guard/min A    Time  4    Period  Weeks    Status  New    Target Date  01/19/20      PT SHORT TERM GOAL #2   Title  Pt will be able to perform sit to stand with supersion with RW from w/c and mat table.    Baseline  --    Time  4    Period  Weeks    Status  New      PT SHORT TERM GOAL #3   Title  Pt will be able to perform 4 steps with step to pattern and B handrails with min guard.    Baseline  4 steps - min A ascending, min/mod A when descending on 12/22/19    Time  4    Period  Weeks        PT Long Term Goals - 10/26/19 1637      PT LONG TERM GOAL #1   Title  Pt will be I and compliant with final HEP. (target for all LTG 12 weeks 12/23/19)    Status  On-going - pt reports currently performing his most recent HEP     PT LONG TERM GOAL #2   Title  Pt will be able to perform sit to stand mod I.    Baseline  min guard/min A from w/c    Status  Not Met     PT LONG TERM GOAL #3   Title  Pt will be able perform stand pivot transfers min A to decrease caregiver burden.    Baseline  not formally assessed today    Status  Deferred     PT LONG TERM GOAL #4   Title  Pt will be able to tolerate standing for 5 min  with UE support.    Baseline  not formally assessed, able to tolerate 350' of gait with RW   Status  Deferred      PT LONG TERM GOAL #5   Title  Pt will improve LE strength to at least 4/5 MMT bilat    Baseline  difficult to assess due to cognitive deficits but at least 4/5    Status  Achieved      PT LONG TERM GOAL #6   Title  New goal created 09/30/19. He will be able to ambulate at least 300 ft for improved household ambulation, limited community ambulaiton with no more than min A LRAD.    Baseline  ambulated 350' today with min guard/min A on 12/22/19   Status  Achieved      Revised LTGs for re-cert:    PT Long Term Goals - 12/22/19 1547      PT LONG TERM GOAL #1   Title  Pt will be I and compliant with final HEP. (target for all LTG 12 weeks 02/16/20)    Time  8    Period  Weeks    Status  New    Target Date  02/16/20      PT LONG TERM GOAL #2   Title  Pt will be able to perform sit to stand mod I.  Baseline  min guard/ one episode of min A from RW.    Time  8    Period  Weeks    Status  On-going      PT LONG TERM GOAL #3   Title  Pt will be able perform stand pivot transfers with RW with supervision to decrease caregiver burden.    Baseline  not formally assessed today.    Time  8    Period  Weeks    Status  New      PT LONG TERM GOAL #4   Title  Pt will be able to ambulate 300' over level indoor and unlevel outdoor paved surfaces with supervision/min guard in order to improve functional mobility.    Time  8    Period  Weeks    Status  New      PT LONG TERM GOAL #5   Title  Pt will improve gait speed to at least 2.6 ft/sec to demo improved functional mobility.    Baseline  2.11 ft/sec on 12/22/19    Time  8    Period  Weeks    Status  New      PT LONG TERM GOAL #6   Title  Pt will perform 12 steps with step to pattern and BUE assist on handrails with min guard in order to perform stairs to reach the 2nd floor of his house.    Baseline  --    Time  8     Period  Weeks    Status  New            12/22/19 1602  Plan  Clinical Impression Statement Pt returns to therapy after being diagnosed with COVID-19 this past month. Pt had a very mild case, has still been walking with his RW and doing his exercise program at home.  Pt has achieved LTG #1, 5,6 in regards to performing his HEP, LE strength, and gait. Deferred LTGs #3 and 4 - did not get to assess today. Pt able to ambulate 350' today continuously with min guard/min A and an additional 203' with min guard after use of theraband to help keep pt more upright and close to RW. Pt's gait speed with RW today was 2.11 ft/sec, indicating limited community ambulator. Pt performed 4 steps to with step to pattern and BUE support with verbal cues for technique and min A when ascending and mod A when descending for balance and pt needing cues to continue to move BLE down rails. Upgraded and revised STGs/LTGs. Pt is making excellent progressing - will re-cert for an additional 2x week for 8 weeks to continue to focus on gait training, BLE strengthening/endurance, stair training, and transfer training in order to improve safety and improve functional mobility.  Personal Factors and Comorbidities Age;Comorbidity 1;Comorbidity 2;Comorbidity 3+;Past/Current Experience;Fitness  Comorbidities PMH: recent hospitilization 05/22/19 atrial fibrillation, dementia, essential hypertension, HLD, diastolic congestive heart failure, aortic valve regurgitation, BPH, and recent small bowel obstruction with recent surgical intervention with lysis of adhesions  Examination-Activity Limitations  (all ADL's)  Examination-Participation Restrictions  (All)  Pt will benefit from skilled therapeutic intervention in order to improve on the following deficits Decreased balance;Abnormal gait;Decreased endurance;Decreased range of motion;Decreased strength;Decreased mobility;Difficulty walking;Postural dysfunction;Pain  Stability/Clinical  Decision Making Evolving/Moderate complexity  Rehab Potential Fair  PT Frequency Other (comment) (2-3)  PT Duration 12 weeks  PT Treatment/Interventions ADLs/Self Care Home Management;Aquatic Therapy;Cryotherapy;Moist Heat;DME Instruction;Gait training;Functional mobility training;Therapeutic activities;Therapeutic exercise;Balance training;Neuromuscular re-education;Manual techniques;Wheelchair mobility training;Patient/family education;Passive range  of motion;Taping  PT Next Visit Plan continue with strengthening on Scifit, gait with RW, work on stairs with 2 person assist  PT [place], sitting marches, sitting hamstring curls, SLR, standing at kitchen sink- UE reaching, marching in place, mini squats  Consulted and Agree with Plan of Care Patient;Family member/caregiver  Family Member Consulted wife      Patient will benefit from skilled therapeutic intervention in order to improve the following deficits and impairments:     Visit Diagnosis: Muscle weakness (generalized)  Other abnormalities of gait and mobility     Problem List Patient Active Problem List   Diagnosis Date Noted  . Bacteremia due to Enterococcus 05/24/2019  . Urinary tract infection 05/24/2019  . Chronic diastolic CHF (congestive heart failure) ([place]) 05/22/2019  . Gout 05/22/2019  . Sepsis ([place]) 05/22/2019  . Protein-calorie malnutrition, severe 05/18/2019  . AKI (acute kidney injury) ([employer]) 05/05/2019  . Transient hypotension 05/05/2019  . Sepsis, Gram negative ([place]) 03/18/2019  . Hypoalbuminemia   . Leukocytosis   . Hyperglycemia   . Persistent atrial fibrillation with RVR ([place])   . Acute on chronic anemia   . Rupture of left patellar tendon 02/15/2019  . Postoperative pain   . Bipolar 1 disorder ([place]) 02/14/2019  . Dementia without behavioral disturbance ([place]) 02/14/2019  . Patellar tendon rupture, left, initial encounter 02/10/2019  . SIRS (systemic inflammatory response syndrome)  ([place]) 02/09/2019  . Osteomyelitis ([place]) 02/09/2019  . Acute blood loss anemia 02/09/2019  . Aortic valve regurgitation 10/16/2018  . SBO (small bowel obstruction) ([place]) 07/08/2013  . Hyperlipidemia 12/20/2011  . HTN (hypertension) 06/04/2011  . Atrial fibrillation, chronic ([place]) 03/01/2011  . Long term (current) use of anticoagulants 03/01/2011  . GERD 10/11/2009  . DIVERTICULOSIS OF COLON 10/11/2009    Arliss Journey, PT, DPT  12/22/2019, 3:41 PM  [facility] [address] [place], [place], [address] Phone: [phone]   Fax:  [phone]  Name: Russell Dillon MRN: [id number] Date of Birth: 02-17-1946

## 2019-12-29 ENCOUNTER — Ambulatory Visit: Payer: Medicare Other | Admitting: Physical Therapy

## 2019-12-29 ENCOUNTER — Encounter: Payer: Self-pay | Admitting: Physical Therapy

## 2019-12-29 ENCOUNTER — Other Ambulatory Visit: Payer: Self-pay

## 2019-12-29 DIAGNOSIS — R2689 Other abnormalities of gait and mobility: Secondary | ICD-10-CM | POA: Diagnosis not present

## 2019-12-29 DIAGNOSIS — M6281 Muscle weakness (generalized): Secondary | ICD-10-CM | POA: Diagnosis not present

## 2019-12-30 NOTE — Therapy (Signed)
[facility] [address] [place], [place], [address] Phone: [phone]   Fax:  [phone]  Physical Therapy Treatment  Patient Details  Name: Russell Dillon MRN: [id number] Date of Birth: [birth year] Referring Provider (PT): Marton Redwood, MD   Encounter Date: 12/29/2019  PT End of Session - 12/29/19 1538    Visit Number  39    Number of Visits  3   recert for 16 more visits due to progress   Date for PT Re-Evaluation  03/21/20   written for 60 day POC   Authorization Type  MCR, AARP, will need progress note visit 30    PT Start Time  1532    PT Stop Time  1622    PT Time Calculation (min)  50 min    Equipment Utilized During Treatment  Gait belt    Activity Tolerance  Patient tolerated treatment well;No increased pain    Behavior During Therapy  WFL for tasks assessed/performed       Past Medical History:  Diagnosis Date  . Acute metabolic encephalopathy 6/60/6301  . Aortic valve regurgitation 10/16/2018   Echo 07/28/2017:  EF 60-65, moderate AI, aortic root and ascending aorta mildly dilated (40 mm), ascending aorta 43 mm, MAC, mild MR, mild LAE, moderate RV enlargement, trivial TR // Echo 12/19: EF 60-65, normal wall motion, mild AI, moderate BAE, ascending aorta 43 mm, aortic root 39 mm  . BPH (benign prostatic hyperplasia)   . BPH (benign prostatic hypertrophy)   . Chronic anticoagulation   . Chronic atrial fibrillation ([place])   . Diastolic dysfunction October 2010   Normal LV systolic function  . Hx SBO   . Hyperlipidemia   . Hypertension   . Patellar tendon rupture, left, initial encounter 02/10/2019  . Severe sepsis ([place]) 05/05/2019  . Small bowel obstruction [facility])     Past Surgical History:  Procedure Laterality Date  . APPENDECTOMY  2004  . APPLICATION OF WOUND VAC Left 02/11/2019   Procedure: Application Of Wound Vac;  Surgeon: Altamese Whatley, MD;  Location: [facility];  Service: Orthopedics;   Laterality: Left;  . CARDIOVASCULAR STRESS TEST  09/30/2007   EF 67%  No ischemia.   Marland Kitchen CARDIOVERSION  05/01/2004  . KNEE ARTHROSCOPY Left 02/11/2019   Procedure: ARTHROSCOPY LEFT KNEE;  Surgeon: Altamese [place], MD;  Location: [place] [facility];  Service: Orthopedics;  Laterality: Left;  . LAPAROTOMY N/A 05/11/2019   Procedure: EXPLORATORY LAPAROTOMY;  Surgeon: Rolm Bookbinder, MD;  Location: [facility];  Service: General;  Laterality: N/A;  . LYSIS OF ADHESION N/A 05/11/2019   Procedure: LYSIS OF ADHESION;  Surgeon: Rolm Bookbinder, MD;  Location: [facility];  Service: General;  Laterality: N/A;  . PATELLAR TENDON REPAIR Left 02/11/2019   Procedure: LEFT PATELLA TENDON REPAIR;  Surgeon: Altamese [place], MD;  Location: [facility];  Service: Orthopedics;  Laterality: Left;  . SMALL INTESTINE SURGERY  2004  . US ECHOCARDIOGRAPHY  09/05/2009   ef 55-60%    There were no vitals filed for this visit.  Subjective Assessment - 12/29/19 1537    Subjective  No new complaints. No falls or pain to report. Daughter brought him today. Reports they are transporting him now due to him and [name] getting Covid from using the transportation service. No issues with getting into/out of the car.    Pertinent History  PMH: recent hospitilization 05/22/19 atrial fibrillation, dementia, essential hypertension, HLD, diastolic congestive heart failure, aortic valve regurgitation, BPH, and recent small bowel  obstruction with recent surgical intervention with lysis of adhesions    Limitations  Walking;Standing    How long can you stand comfortably?  can not stand on his own    How long can you walk comfortably?  can not walk on his own    Patient Stated Goals  to walk again    Currently in Pain?  No/denies           [facility] Adult PT Treatment/Exercise - 12/29/19 1542      Transfers   Transfers  Sit to Stand;Stand to [employer] Transfers    Stand Pivot Transfers  4: Min assist    Stand Pivot Transfer Details (indicate cue type and  reason)  from wheelchair to scifit seat with min assist to initiate, mod assist to complete due to pt attempted to sit too soon after foot got caught. will try again with RW next session.       Ambulation/Gait   Ambulation/Gait  Yes    Ambulation/Gait Assistance  4: Min guard    Ambulation/Gait Assistance Details  with 1st gait rep had pt working on left/right corner turns, narrow spaces and obstacle/furniture negotiation with cues on posture and walker negotiation, min guard assist with pt demo'ing improved positioning in walker today; 2cd and 3rd walks where from indoor>outdoor paved>indoor surfaces with one seated rest break outside. one episode of forward balance loss when wheels caught uneven lined pavement needing total assist of 2 people to correct/regain balance, then pt min assist of one person for walker steering/balance, with second person on stand by for safety. wheelchair follow with outdoor gait only.     Ambulation Distance (Feet)  344 Feet   x1, 250 x2 in/outdoor   Assistive device  Rolling walker    Gait Pattern  Step-through pattern;Narrow base of support;Trunk flexed    Ambulation Surface  Level;Indoor;Unlevel;Outdoor      Knee/Hip Exercises: Aerobic   Other Aerobic  Scifit UE/LE's level 5.0 for 8 minutes with goal >/= 50 rpm for strengthening and activity tolerance.            PT Short Term Goals - 12/22/19 1544      PT SHORT TERM GOAL #1   Title  Pt will be able to ambulate at least 300' with RW with supervision in order to improve mobility. ALL STGS DUE 01/19/20    Baseline  350' with min guard/min A    Time  4    Period  Weeks    Status  New    Target Date  01/19/20      PT SHORT TERM GOAL #2   Title  Pt will be able to perform sit to stand with supersion with RW from w/c and mat table.    Baseline  --    Time  4    Period  Weeks    Status  New      PT SHORT TERM GOAL #3   Title  Pt will be able to perform 4 steps with step to pattern and B handrails with  min guard.    Baseline  4 steps - min A ascending, min/mod A when descending on 12/22/19    Time  4    Period  Weeks        PT Long Term Goals - 12/22/19 1547      PT LONG TERM GOAL #1   Title  Pt will be I and compliant with final HEP. (target for all LTG 12 weeks  02/16/20)    Time  8    Period  Weeks    Status  New    Target Date  02/16/20      PT LONG TERM GOAL #2   Title  Pt will be able to perform sit to stand mod I.    Baseline  min guard/ one episode of min A from RW.    Time  8    Period  Weeks    Status  On-going      PT LONG TERM GOAL #3   Title  Pt will be able perform stand pivot transfers with RW with supervision to decrease caregiver burden.    Baseline  not formally assessed today.    Time  8    Period  Weeks    Status  New      PT LONG TERM GOAL #4   Title  Pt will be able to ambulate 300' over level indoor and unlevel outdoor paved surfaces with supervision/min guard in order to improve functional mobility.    Time  8    Period  Weeks    Status  New      PT LONG TERM GOAL #5   Title  Pt will improve gait speed to at least 2.6 ft/sec to demo improved functional mobility.    Baseline  2.11 ft/sec on 12/22/19    Time  8    Period  Weeks    Status  New      PT LONG TERM GOAL #6   Title  Pt will perform 12 steps with step to pattern and BUE assist on handrails with min guard in order to perform stairs to reach the 2nd floor of his house.    Baseline  --    Time  8    Period  Weeks    Status  New            Plan - 12/29/19 1542    Clinical Impression Statement  Today's skilled session continued to address strengthening/activity tolerance with Scifit and gait with RW on indoor progressing to outdoor surfaces. One significant forward balance loss when walker wheels caught uneven pavement needing total assist to prevent fall/regain balance. After that pt was more aware of uneven surfaces with min guard to min assist needed for balance. The pt is  progressing toward goals and should benefit from continued PT to progress toward unmet goals.    Personal Factors and Comorbidities  Age;Comorbidity 1;Comorbidity 2;Comorbidity 3+;Past/Current Experience;Fitness    Comorbidities  PMH: recent hospitilization 05/22/19 atrial fibrillation, dementia, essential hypertension, HLD, diastolic congestive heart failure, aortic valve regurgitation, BPH, and recent small bowel obstruction with recent surgical intervention with lysis of adhesions    Examination-Activity Limitations  --   all ADL's   Examination-Participation Restrictions  --   All   Stability/Clinical Decision Making  Evolving/Moderate complexity    Rehab Potential  Fair    PT Frequency  Other (comment)   2-3   PT Duration  12 weeks    PT Treatment/Interventions  ADLs/Self Care Home Management;Aquatic Therapy;Cryotherapy;Moist Heat;DME Instruction;Gait training;Functional mobility training;Therapeutic activities;Therapeutic exercise;Balance training;Neuromuscular re-education;Manual techniques;Wheelchair mobility training;Patient/family education;Passive range of motion;Taping    PT Next Visit Plan  continue with strengthening on Scifit, gait with RW, work on stairs with 2 person assist    PT [place], sitting marches, sitting hamstring curls, SLR, standing at kitchen sink- UE reaching, marching in place, mini squats    Consulted and Agree  with Plan of Care  Patient;Family member/caregiver    Family Member Consulted  wife       Patient will benefit from skilled therapeutic intervention in order to improve the following deficits and impairments:  Decreased balance, Abnormal gait, Decreased endurance, Decreased range of motion, Decreased strength, Decreased mobility, Difficulty walking, Postural dysfunction, Pain  Visit Diagnosis: Muscle weakness (generalized)  Other abnormalities of gait and mobility     Problem List Patient Active Problem List   Diagnosis Date Noted   . Bacteremia due to Enterococcus 05/24/2019  . Urinary tract infection 05/24/2019  . Chronic diastolic CHF (congestive heart failure) ([place]) 05/22/2019  . Gout 05/22/2019  . Sepsis ([place]) 05/22/2019  . Protein-calorie malnutrition, severe 05/18/2019  . AKI (acute kidney injury) ([place]) 05/05/2019  . Transient hypotension 05/05/2019  . Sepsis, Gram negative ([place]) 03/18/2019  . Hypoalbuminemia   . Leukocytosis   . Hyperglycemia   . Persistent atrial fibrillation with RVR ([place])   . Acute on chronic anemia   . Rupture of left patellar tendon 02/15/2019  . Postoperative pain   . Bipolar 1 disorder ([place]) 02/14/2019  . Dementia without behavioral disturbance ([place]) 02/14/2019  . Patellar tendon rupture, left, initial encounter 02/10/2019  . SIRS (systemic inflammatory response syndrome) ([place]) 02/09/2019  . Osteomyelitis ([place]) 02/09/2019  . Acute blood loss anemia 02/09/2019  . Aortic valve regurgitation 10/16/2018  . SBO (small bowel obstruction) ([place]) 07/08/2013  . Hyperlipidemia 12/20/2011  . HTN (hypertension) 06/04/2011  . Atrial fibrillation, chronic ([place]) 03/01/2011  . Long term (current) use of anticoagulants 03/01/2011  . GERD 10/11/2009  . DIVERTICULOSIS OF COLON 10/11/2009   Willow Ora, PTA, [facility] [address], [place], [place] [address] [phone] 12/30/19, 3:38 PM   Name: JERMAR COLTER MRN: [id number] Date of Birth: 20-Jul-1946

## 2019-12-31 ENCOUNTER — Encounter: Payer: Self-pay | Admitting: Physical Therapy

## 2019-12-31 ENCOUNTER — Ambulatory Visit: Payer: Medicare Other | Admitting: Physical Therapy

## 2019-12-31 ENCOUNTER — Other Ambulatory Visit: Payer: Self-pay

## 2019-12-31 DIAGNOSIS — R2689 Other abnormalities of gait and mobility: Secondary | ICD-10-CM

## 2019-12-31 DIAGNOSIS — M6281 Muscle weakness (generalized): Secondary | ICD-10-CM | POA: Diagnosis not present

## 2019-12-31 NOTE — Therapy (Addendum)
[facility] [address] [place], [place], [address] Phone: [phone]   Fax:  [phone]  Physical Therapy Treatment/10th Visit Progress Note  Patient Details  Name: Russell Dillon MRN: [id number] Date of Birth: 06/11/1946 Referring Provider (PT): Marton Redwood, MD  10th Visit Physical Therapy Progress Note  Dates of Reporting Period: 10/29/19 to 12/31/19  Done by: Janann August, PT, DPT 01/04/20 3:42 PM     Encounter Date: 12/31/2019  PT End of Session - 12/31/19 1450    Visit Number  40    Number of Visits  55   recert for 16 more visits due to progress   Date for PT Re-Evaluation  03/21/20   written for 60 day POC   Authorization Type  MCR, AARP, 10th visit progress notes    PT Start Time  1448    PT Stop Time  1528    PT Time Calculation (min)  40 min    Equipment Utilized During Treatment  Gait belt    Activity Tolerance  Patient tolerated treatment well;No increased pain    Behavior During Therapy  WFL for tasks assessed/performed       Past Medical History:  Diagnosis Date  . Acute metabolic encephalopathy 9/62/2297  . Aortic valve regurgitation 10/16/2018   Echo 07/28/2017:  EF 60-65, moderate AI, aortic root and ascending aorta mildly dilated (40 mm), ascending aorta 43 mm, MAC, mild MR, mild LAE, moderate RV enlargement, trivial TR // Echo 12/19: EF 60-65, normal wall motion, mild AI, moderate BAE, ascending aorta 43 mm, aortic root 39 mm  . BPH (benign prostatic hyperplasia)   . BPH (benign prostatic hypertrophy)   . Chronic anticoagulation   . Chronic atrial fibrillation ([place])   . Diastolic dysfunction October 2010   Normal LV systolic function  . Hx SBO   . Hyperlipidemia   . Hypertension   . Patellar tendon rupture, left, initial encounter 02/10/2019  . Severe sepsis ([place]) 05/05/2019  . Small bowel obstruction [facility])     Past Surgical History:  Procedure Laterality Date  .  APPENDECTOMY  2004  . APPLICATION OF WOUND VAC Left 02/11/2019   Procedure: Application Of Wound Vac;  Surgeon: Altamese Wylie, MD;  Location: [facility];  Service: Orthopedics;  Laterality: Left;  . CARDIOVASCULAR STRESS TEST  09/30/2007   EF 67%  No ischemia.   Marland Kitchen CARDIOVERSION  05/01/2004  . KNEE ARTHROSCOPY Left 02/11/2019   Procedure: ARTHROSCOPY LEFT KNEE;  Surgeon: Altamese Hartwell, MD;  Location: [facility];  Service: Orthopedics;  Laterality: Left;  . LAPAROTOMY N/A 05/11/2019   Procedure: EXPLORATORY LAPAROTOMY;  Surgeon: Rolm Bookbinder, MD;  Location: [facility];  Service: General;  Laterality: N/A;  . LYSIS OF ADHESION N/A 05/11/2019   Procedure: LYSIS OF ADHESION;  Surgeon: Rolm Bookbinder, MD;  Location: [facility];  Service: General;  Laterality: N/A;  . PATELLAR TENDON REPAIR Left 02/11/2019   Procedure: LEFT PATELLA TENDON REPAIR;  Surgeon: Altamese Floris, MD;  Location: [facility];  Service: Orthopedics;  Laterality: Left;  . SMALL INTESTINE SURGERY  2004  . US ECHOCARDIOGRAPHY  09/05/2009   ef 55-60%    There were no vitals filed for this visit.  Subjective Assessment - 12/31/19 1449    Subjective  No new complatins. No falls or pain to report.    Patient is accompained by:  Family member   son in law in lobby   Pertinent History  PMH: recent hospitilization 05/22/19 atrial fibrillation, dementia, essential  hypertension, HLD, diastolic congestive heart failure, aortic valve regurgitation, BPH, and recent small bowel obstruction with recent surgical intervention with lysis of adhesions    Limitations  Walking;Standing    How long can you stand comfortably?  can not stand on his own    How long can you walk comfortably?  can not walk on his own    Patient Stated Goals  to walk again    Pain Score  0-No pain        [facility] Adult PT Treatment/Exercise - 12/31/19 1450      Transfers   Transfers  Sit to Stand;Stand to [employer] Transfers    Sit to Stand  4: Min guard;With upper extremity  assist;From chair/3-in-1    Stand to Sit  4: Min guard;With upper extremity assist;To chair/3-in-1    Stand Pivot Transfers  4: Min guard    Stand Pivot Transfer Details (indicate cue type and reason)  with RW from wheelchair to seat of Scifit with cues on technique/sequencing with RW      Ambulation/Gait   Ambulation/Gait  Yes    Ambulation/Gait Assistance  4: Min guard    Ambulation/Gait Assistance Details  no chair follow this session. improved control of walker noted with pt able to steer around objects without assistance, only needed cues to avoid the object.     Ambulation Distance (Feet)  220 Feet   x1, 130 x2, 110 x1   Assistive device  Rolling walker    Gait Pattern  Step-through pattern;Narrow base of support;Trunk flexed    Ambulation Surface  Level;Indoor    Stairs  Yes    Stairs Assistance  4: Min assist   second person stand by   [employer] Details (indicate cue type and reason)  cues to bring hands down the rails further with descending the stairs. on second rep pt ascended with a reciprocal pattern vs step to pattern. second person was hands off for both reps, close by for safety.     Stair Management Technique  Two rails;Alternating pattern;Step to pattern;Forwards    Number of Stairs  4   x2   Height of Stairs  6      Knee/Hip Exercises: Aerobic   Other Aerobic  Scifit UE/LE's level 5.0 for 10 minutes with goal >/= 50 rpm for strengthening and activity tolerance.            PT Short Term Goals - 12/22/19 1544      PT SHORT TERM GOAL #1   Title  Pt will be able to ambulate at least 300' with RW with supervision in order to improve mobility. ALL STGS DUE 01/19/20    Baseline  350' with min guard/min A    Time  4    Period  Weeks    Status  New    Target Date  01/19/20      PT SHORT TERM GOAL #2   Title  Pt will be able to perform sit to stand with supersion with RW from w/c and mat table.    Baseline  --    Time  4    Period  Weeks    Status  New       PT SHORT TERM GOAL #3   Title  Pt will be able to perform 4 steps with step to pattern and B handrails with min guard.    Baseline  4 steps - min A ascending, min/mod A when descending on 12/22/19  Time  4    Period  Weeks        PT Long Term Goals - 12/22/19 1547      PT LONG TERM GOAL #1   Title  Pt will be I and compliant with final HEP. (target for all LTG 12 weeks 02/16/20)    Time  8    Period  Weeks    Status  New    Target Date  02/16/20      PT LONG TERM GOAL #2   Title  Pt will be able to perform sit to stand mod I.    Baseline  min guard/ one episode of min A from RW.    Time  8    Period  Weeks    Status  On-going      PT LONG TERM GOAL #3   Title  Pt will be able perform stand pivot transfers with RW with supervision to decrease caregiver burden.    Baseline  not formally assessed today.    Time  8    Period  Weeks    Status  New      PT LONG TERM GOAL #4   Title  Pt will be able to ambulate 300' over level indoor and unlevel outdoor paved surfaces with supervision/min guard in order to improve functional mobility.    Time  8    Period  Weeks    Status  New      PT LONG TERM GOAL #5   Title  Pt will improve gait speed to at least 2.6 ft/sec to demo improved functional mobility.    Baseline  2.11 ft/sec on 12/22/19    Time  8    Period  Weeks    Status  New      PT LONG TERM GOAL #6   Title  Pt will perform 12 steps with step to pattern and BUE assist on handrails with min guard in order to perform stairs to reach the 2nd floor of his house.    Baseline  --    Time  8    Period  Weeks    Status  New            Plan - 12/31/19 1450    Clinical Impression Statement  Today's skilled session continued to focus on gait with RW, activity tolerance and stair negotiation. Less assistance needed with stairs and less cues needed for posture with gait. The pt is progressing toward goals and should benefit from continued PT to progress toward unmet  goals.    Personal Factors and Comorbidities  Age;Comorbidity 1;Comorbidity 2;Comorbidity 3+;Past/Current Experience;Fitness    Comorbidities  PMH: recent hospitilization 05/22/19 atrial fibrillation, dementia, essential hypertension, HLD, diastolic congestive heart failure, aortic valve regurgitation, BPH, and recent small bowel obstruction with recent surgical intervention with lysis of adhesions    Examination-Activity Limitations  --   all ADL's   Examination-Participation Restrictions  --   All   Stability/Clinical Decision Making  Evolving/Moderate complexity    Rehab Potential  Fair    PT Frequency  Other (comment)   2-3   PT Duration  12 weeks    PT Treatment/Interventions  ADLs/Self Care Home Management;Aquatic Therapy;Cryotherapy;Moist Heat;DME Instruction;Gait training;Functional mobility training;Therapeutic activities;Therapeutic exercise;Balance training;Neuromuscular re-education;Manual techniques;Wheelchair mobility training;Patient/family education;Passive range of motion;Taping    PT Next Visit Plan  continue with strengthening on Scifit, gait with RW, work on stairs with 2 person assist    PT [place]  LAQ, sitting marches, sitting hamstring curls, SLR, standing at kitchen sink- UE reaching, marching in place, mini squats    Consulted and Agree with Plan of Care  Patient;Family member/caregiver    Family Member Consulted  wife       Patient will benefit from skilled therapeutic intervention in order to improve the following deficits and impairments:  Decreased balance, Abnormal gait, Decreased endurance, Decreased range of motion, Decreased strength, Decreased mobility, Difficulty walking, Postural dysfunction, Pain  Visit Diagnosis: Muscle weakness (generalized)  Other abnormalities of gait and mobility     Problem List Patient Active Problem List   Diagnosis Date Noted  . Bacteremia due to Enterococcus 05/24/2019  . Urinary tract infection 05/24/2019   . Chronic diastolic CHF (congestive heart failure) ([place]) 05/22/2019  . Gout 05/22/2019  . Sepsis ([place]) 05/22/2019  . Protein-calorie malnutrition, severe 05/18/2019  . AKI (acute kidney injury) ([place]) 05/05/2019  . Transient hypotension 05/05/2019  . Sepsis, Gram negative ([place]) 03/18/2019  . Hypoalbuminemia   . Leukocytosis   . Hyperglycemia   . Persistent atrial fibrillation with RVR ([place])   . Acute on chronic anemia   . Rupture of left patellar tendon 02/15/2019  . Postoperative pain   . Bipolar 1 disorder ([place]) 02/14/2019  . Dementia without behavioral disturbance ([place]) 02/14/2019  . Patellar tendon rupture, left, initial encounter 02/10/2019  . SIRS (systemic inflammatory response syndrome) ([place]) 02/09/2019  . Osteomyelitis ([place]) 02/09/2019  . Acute blood loss anemia 02/09/2019  . Aortic valve regurgitation 10/16/2018  . SBO (small bowel obstruction) ([place]) 07/08/2013  . Hyperlipidemia 12/20/2011  . HTN (hypertension) 06/04/2011  . Atrial fibrillation, chronic ([place]) 03/01/2011  . Long term (current) use of anticoagulants 03/01/2011  . GERD 10/11/2009  . DIVERTICULOSIS OF COLON 10/11/2009    Willow Ora, PTA, [facility] [address] [place], [place] [address] [phone] 12/31/19, 6:19 PM   Name: Russell Dillon MRN: [id number] Date of Birth: May 22, 1946

## 2020-01-04 ENCOUNTER — Other Ambulatory Visit: Payer: Medicare Other | Admitting: Licensed Clinical Social Worker

## 2020-01-04 ENCOUNTER — Ambulatory Visit: Payer: Medicare Other | Admitting: Physical Therapy

## 2020-01-04 ENCOUNTER — Other Ambulatory Visit: Payer: Self-pay

## 2020-01-04 DIAGNOSIS — R2689 Other abnormalities of gait and mobility: Secondary | ICD-10-CM | POA: Diagnosis not present

## 2020-01-04 DIAGNOSIS — L0101 Non-bullous impetigo: Secondary | ICD-10-CM | POA: Diagnosis not present

## 2020-01-04 DIAGNOSIS — M6281 Muscle weakness (generalized): Secondary | ICD-10-CM | POA: Diagnosis not present

## 2020-01-04 DIAGNOSIS — L2089 Other atopic dermatitis: Secondary | ICD-10-CM | POA: Diagnosis not present

## 2020-01-04 DIAGNOSIS — L309 Dermatitis, unspecified: Secondary | ICD-10-CM | POA: Diagnosis not present

## 2020-01-04 DIAGNOSIS — Z515 Encounter for palliative care: Secondary | ICD-10-CM

## 2020-01-04 DIAGNOSIS — L0109 Other impetigo: Secondary | ICD-10-CM | POA: Diagnosis not present

## 2020-01-04 NOTE — Therapy (Signed)
[facility] [address] [place], [place], [address] Phone: [phone]   Fax:  [phone]  Physical Therapy Treatment  Patient Details  Name: Russell Dillon MRN: [id number] Date of Birth: 11-28-1945 Referring Provider (PT): Marton Redwood, MD   Encounter Date: 01/04/2020  PT End of Session - 01/04/20 1227    Visit Number  41    Number of Visits  6   recert for 16 more visits due to progress   Date for PT Re-Evaluation  03/21/20   written for 60 day POC   Authorization Type  MCR, A[employer], 10th visit progress notes    PT Start Time  1146    PT Stop Time  1225    PT Time Calculation (min)  39 min    Equipment Utilized During Treatment  Gait belt    Activity Tolerance  Patient tolerated treatment well;No increased pain    Behavior During Therapy  WFL for tasks assessed/performed       Past Medical History:  Diagnosis Date  . Acute metabolic encephalopathy 2/69/4854  . Aortic valve regurgitation 10/16/2018   Echo 07/28/2017:  EF 60-65, moderate AI, aortic root and ascending aorta mildly dilated (40 mm), ascending aorta 43 mm, MAC, mild MR, mild LAE, moderate RV enlargement, trivial TR // Echo 12/19: EF 60-65, normal wall motion, mild AI, moderate BAE, ascending aorta 43 mm, aortic root 39 mm  . BPH (benign prostatic hyperplasia)   . BPH (benign prostatic hypertrophy)   . Chronic anticoagulation   . Chronic atrial fibrillation ([place])   . Diastolic dysfunction October 2010   Normal LV systolic function  . Hx SBO   . Hyperlipidemia   . Hypertension   . Patellar tendon rupture, left, initial encounter 02/10/2019  . Severe sepsis ([place]) 05/05/2019  . Small bowel obstruction [facility])     Past Surgical History:  Procedure Laterality Date  . APPENDECTOMY  2004  . APPLICATION OF WOUND VAC Left 02/11/2019   Procedure: Application Of Wound Vac;  Surgeon: Altamese Montevideo, MD;  Location: [facility];  Service: Orthopedics;  Laterality:  Left;  . CARDIOVASCULAR STRESS TEST  09/30/2007   EF 67%  No ischemia.   Marland Kitchen CARDIOVERSION  05/01/2004  . KNEE ARTHROSCOPY Left 02/11/2019   Procedure: ARTHROSCOPY LEFT KNEE;  Surgeon: Altamese Bertram, MD;  Location: [facility];  Service: Orthopedics;  Laterality: Left;  . LAPAROTOMY N/A 05/11/2019   Procedure: EXPLORATORY LAPAROTOMY;  Surgeon: Rolm Bookbinder, MD;  Location: [facility];  Service: General;  Laterality: N/A;  . LYSIS OF ADHESION N/A 05/11/2019   Procedure: LYSIS OF ADHESION;  Surgeon: Rolm Bookbinder, MD;  Location: [facility];  Service: General;  Laterality: N/A;  . PATELLAR TENDON REPAIR Left 02/11/2019   Procedure: LEFT PATELLA TENDON REPAIR;  Surgeon: Altamese Gaston, MD;  Location: [facility];  Service: Orthopedics;  Laterality: Left;  . SMALL INTESTINE SURGERY  2004  . US ECHOCARDIOGRAPHY  09/05/2009   ef 55-60%    There were no vitals filed for this visit.  Subjective Assessment - 01/04/20 1156    Subjective  Walked into the clinic today.    Patient is accompained by:  Family member   son in law in lobby   Pertinent History  PMH: recent hospitilization 05/22/19 atrial fibrillation, dementia, essential hypertension, HLD, diastolic congestive heart failure, aortic valve regurgitation, BPH, and recent small bowel obstruction with recent surgical intervention with lysis of adhesions    Limitations  Walking;Standing    How long  can you stand comfortably?  can not stand on his own    How long can you walk comfortably?  can not walk on his own    Patient Stated Goals  to walk again    Currently in Pain?  No/denies                       [facility] Adult PT Treatment/Exercise - 01/04/20 1159      Transfers   Transfers  Sit to Stand;Stand to [employer] Transfers    Sit to Stand  4: Min guard;With upper extremity assist;From chair/3-in-1    Sit to Stand Details  Verbal cues for technique;Tactile cues for placement    Sit to Stand Details (indicate cue type and reason)  verbal  cues for weight shift    Stand to Sit  4: Min guard;With upper extremity assist;To chair/3-in-1    Stand Pivot Transfers  4: Min guard    Stand Pivot Transfer Details (indicate cue type and reason)  with RW to SciFit - cues for technique and stepping/keeping RW close       Ambulation/Gait   Ambulation/Gait  Yes    Ambulation/Gait Assistance  4: Min assist;4: Min guard    Ambulation/Gait Assistance Details  w/c follow for ambulating outdoors as pt needed one brief seated rest break. min guard for balance while performing inclines/divets in pavement outdoors, needed frequent manual and verbal cues to stay upright in RW vs. pushing too far forward anteriorly (especially when going up inclines).     Ambulation Distance (Feet)  500 Feet   total outdoors, 150' x2   Assistive device  Rolling walker    Gait Pattern  Step-through pattern;Narrow base of support;Trunk flexed    Ambulation Surface  Level;Indoor    Stairs  Yes    Stairs Assistance  4: Min assist;Other (comment)   2nd person providing stand by/ min A   Stairs Assistance Details (indicate cue type and reason)  cues to bring hands forward while descending stairs. step to pattern for ascending and descending, cues to properly perform turn at the top of staircase to carefully come back down. needed one episode at bottom of stairs of mod A for balance for pt turning to sit back to rest in w/c (got stuck turning around railing of staircase)     Stair Management Technique  Two rails;Step to pattern;Forwards    Number of Stairs  4   x2   Height of Stairs  6      Knee/Hip Exercises: Aerobic   Other Aerobic  SciFit with BLE and BUEs at level 5.0 for 8 minutes for strengthening, endurance, ROM, and activity tolerance.                PT Short Term Goals - 12/22/19 1544      PT SHORT TERM GOAL #1   Title  Pt will be able to ambulate at least 300' with RW with supervision in order to improve mobility. ALL STGS DUE 01/19/20    Baseline   350' with min guard/min A    Time  4    Period  Weeks    Status  New    Target Date  01/19/20      PT SHORT TERM GOAL #2   Title  Pt will be able to perform sit to stand with supersion with RW from w/c and mat table.    Baseline  --    Time  4  Period  Weeks    Status  New      PT SHORT TERM GOAL #3   Title  Pt will be able to perform 4 steps with step to pattern and B handrails with min guard.    Baseline  4 steps - min A ascending, min/mod A when descending on 12/22/19    Time  4    Period  Weeks        PT Long Term Goals - 12/22/19 1547      PT LONG TERM GOAL #1   Title  Pt will be I and compliant with final HEP. (target for all LTG 12 weeks 02/16/20)    Time  8    Period  Weeks    Status  New    Target Date  02/16/20      PT LONG TERM GOAL #2   Title  Pt will be able to perform sit to stand mod I.    Baseline  min guard/ one episode of min A from RW.    Time  8    Period  Weeks    Status  On-going      PT LONG TERM GOAL #3   Title  Pt will be able perform stand pivot transfers with RW with supervision to decrease caregiver burden.    Baseline  not formally assessed today.    Time  8    Period  Weeks    Status  New      PT LONG TERM GOAL #4   Title  Pt will be able to ambulate 300' over level indoor and unlevel outdoor paved surfaces with supervision/min guard in order to improve functional mobility.    Time  8    Period  Weeks    Status  New      PT LONG TERM GOAL #5   Title  Pt will improve gait speed to at least 2.6 ft/sec to demo improved functional mobility.    Baseline  2.11 ft/sec on 12/22/19    Time  8    Period  Weeks    Status  New      PT LONG TERM GOAL #6   Title  Pt will perform 12 steps with step to pattern and BUE assist on handrails with min guard in order to perform stairs to reach the 2nd floor of his house.    Baseline  --    Time  8    Period  Weeks    Status  New            Plan - 01/04/20 1523    Clinical Impression  Statement  Focus of today's skilled session was improving activity tolerance, gait and stair training. Pt able to ambulate 500' outdoors today with one brief seated rest breaks with min guard/occasional min A to help keep RW close to body (especially when going up inclines). Frequent verbal and demonstrative cues for correct positioning in RW. Pt able to perform stairs with a step to pattern ascending/descending with BUE support and min A for balance - still needs cues for proper positioning of BUE on rails when descending. Pt is making great progress - will continue to progress towards LTGs.    Personal Factors and Comorbidities  Age;Comorbidity 1;Comorbidity 2;Comorbidity 3+;Past/Current Experience;Fitness    Comorbidities  PMH: recent hospitilization 05/22/19 atrial fibrillation, dementia, essential hypertension, HLD, diastolic congestive heart failure, aortic valve regurgitation, BPH, and recent small bowel obstruction with recent surgical intervention with lysis  of adhesions    Examination-Activity Limitations  --   all ADL's   Examination-Participation Restrictions  --   All   Stability/Clinical Decision Making  Evolving/Moderate complexity    Rehab Potential  Fair    PT Frequency  Other (comment)   2-3   PT Duration  12 weeks    PT Treatment/Interventions  ADLs/Self Care Home Management;Aquatic Therapy;Cryotherapy;Moist Heat;DME Instruction;Gait training;Functional mobility training;Therapeutic activities;Therapeutic exercise;Balance training;Neuromuscular re-education;Manual techniques;Wheelchair mobility training;Patient/family education;Passive range of motion;Taping    PT Next Visit Plan  continue with strengthening on Scifit, gait with RW, work on stairs with 2 person assist    PT [place], sitting marches, sitting hamstring curls, SLR, standing at kitchen sink- UE reaching, marching in place, mini squats    Consulted and Agree with Plan of Care  Patient;Family  member/caregiver    Family Member Consulted  wife       Patient will benefit from skilled therapeutic intervention in order to improve the following deficits and impairments:  Decreased balance, Abnormal gait, Decreased endurance, Decreased range of motion, Decreased strength, Decreased mobility, Difficulty walking, Postural dysfunction, Pain  Visit Diagnosis: Other abnormalities of gait and mobility  Muscle weakness (generalized)     Problem List Patient Active Problem List   Diagnosis Date Noted  . Bacteremia due to Enterococcus 05/24/2019  . Urinary tract infection 05/24/2019  . Chronic diastolic CHF (congestive heart failure) ([place]) 05/22/2019  . Gout 05/22/2019  . Sepsis ([place]) 05/22/2019  . Protein-calorie malnutrition, severe 05/18/2019  . AKI (acute kidney injury) ([place]) 05/05/2019  . Transient hypotension 05/05/2019  . Sepsis, Gram negative ([place]) 03/18/2019  . Hypoalbuminemia   . Leukocytosis   . Hyperglycemia   . Persistent atrial fibrillation with RVR ([place])   . Acute on chronic anemia   . Rupture of left patellar tendon 02/15/2019  . Postoperative pain   . Bipolar 1 disorder ([place]) 02/14/2019  . Dementia without behavioral disturbance ([place]) 02/14/2019  . Patellar tendon rupture, left, initial encounter 02/10/2019  . SIRS (systemic inflammatory response syndrome) ([place]) 02/09/2019  . Osteomyelitis ([place]) 02/09/2019  . Acute blood loss anemia 02/09/2019  . Aortic valve regurgitation 10/16/2018  . SBO (small bowel obstruction) ([place]) 07/08/2013  . Hyperlipidemia 12/20/2011  . HTN (hypertension) 06/04/2011  . Atrial fibrillation, chronic ([place]) 03/01/2011  . Long term (current) use of anticoagulants 03/01/2011  . GERD 10/11/2009  . DIVERTICULOSIS OF COLON 10/11/2009    Arliss Journey, PT, DPT  01/04/2020, 3:23 PM  [facility] [address] [place], [place], [address] Phone: [phone]   Fax:   [phone]  Name: Russell Dillon MRN: [id number] Date of Birth: [birth year]

## 2020-01-06 ENCOUNTER — Ambulatory Visit: Payer: Medicare Other | Admitting: Physical Therapy

## 2020-01-06 ENCOUNTER — Other Ambulatory Visit: Payer: Self-pay

## 2020-01-06 ENCOUNTER — Encounter: Payer: Self-pay | Admitting: Physical Therapy

## 2020-01-06 DIAGNOSIS — R2689 Other abnormalities of gait and mobility: Secondary | ICD-10-CM | POA: Diagnosis not present

## 2020-01-06 DIAGNOSIS — M6281 Muscle weakness (generalized): Secondary | ICD-10-CM

## 2020-01-06 NOTE — Progress Notes (Signed)
COMMUNITY PALLIATIVE CARE SW NOTE  PATIENT NAME: Russell Dillon DOB: 01/09/1946 MRN: [id number]  PRIMARY CARE PROVIDER: Marton Redwood, MD  RESPONSIBLE PARTY:  Acct ID - Guarantor Home Phone Work Phone Relationship Acct Type  [id number] Demere Capel,* [id number]  Self P/F     [place], [place], [place] [address]   Due to the COVID-19 crisis, this virtual check-in visit was done via telephone from my office and it was initiated and consent given by thispatient.  PLAN OF CARE and INTERVENTIONS:             1. GOALS OF CARE/ ADVANCE CARE PLANNING:  Goal is for patient to have the best life possible.  He is a full code. 2. SOCIAL/EMOTIONAL/SPIRITUAL ASSESSMENT/ INTERVENTIONS:  SW conducted a Sales executive visit with patient's wife, Russell Dillon.  She is still recovering from being in the hospital from [place].  She went to rehab at [facility] and has been home for a week.  She said her brain was in a "fog".  She displayed word finding issues during the visit.  [name] stated patient is able to transport via the family and not [employer] any longer.  SW provided active listening and supportive counseling. 3. PATIENT/CAREGIVER EDUCATION/ COPING:  [name] copes by problem solving. 4. PERSONAL EMERGENCY PLAN:  Russell Dillon will contact EMS. 5. COMMUNITY RESOURCES COORDINATION/ HEALTH CARE NAVIGATION:  Patient receiving rehab. 6. FINANCIAL/LEGAL CONCERNS/INTERVENTIONS:  None.     SOCIAL HX:  Social History   Tobacco Use  . Smoking status: Never Smoker  . Smokeless tobacco: Never Used  Substance Use Topics  . Alcohol use: No    Alcohol/week: 0.0 standard drinks    CODE STATUS:  Full Code ADVANCED DIRECTIVES:  N MOST FORM COMPLETE:  N HOSPICE EDUCATION PROVIDED:  N PPS:  His appetite is normal.  He ambulates with a walker. Duration of visit and documentation:  30 minutes.      Creola Corn Desi Rowe, LCSW

## 2020-01-07 NOTE — Therapy (Signed)
[facility] [address] [place], [place], [address] Phone: [phone]   Fax:  [phone]  Physical Therapy Treatment  Patient Details  Name: Russell Dillon MRN: [id number] Date of Birth: 12-20-1945 Referring Provider (PT): Russell Redwood, MD   Encounter Date: 01/06/2020  PT End of Session - 01/06/20 1326    Visit Number  42    Number of Visits  69   recert for 16 more visits due to progress   Date for PT Re-Evaluation  03/21/20   written for 60 day POC   Authorization Type  MCR, AARP, 10th visit progress notes    PT Start Time  1317    PT Stop Time  1355    PT Time Calculation (min)  38 min    Equipment Utilized During Treatment  Gait belt    Activity Tolerance  Patient tolerated treatment well;No increased pain    Behavior During Therapy  WFL for tasks assessed/performed       Past Medical History:  Diagnosis Date  . Acute metabolic encephalopathy 6/81/2751  . Aortic valve regurgitation 10/16/2018   Echo 07/28/2017:  EF 60-65, moderate AI, aortic root and ascending aorta mildly dilated (40 mm), ascending aorta 43 mm, MAC, mild MR, mild LAE, moderate RV enlargement, trivial TR // Echo 12/19: EF 60-65, normal wall motion, mild AI, moderate BAE, ascending aorta 43 mm, aortic root 39 mm  . BPH (benign prostatic hyperplasia)   . BPH (benign prostatic hypertrophy)   . Chronic anticoagulation   . Chronic atrial fibrillation ([place])   . Diastolic dysfunction October 2010   Normal LV systolic function  . Hx SBO   . Hyperlipidemia   . Hypertension   . Patellar tendon rupture, left, initial encounter 02/10/2019  . Severe sepsis ([place]) 05/05/2019  . Small bowel obstruction [facility])     Past Surgical History:  Procedure Laterality Date  . APPENDECTOMY  2004  . APPLICATION OF WOUND VAC Left 02/11/2019   Procedure: Application Of Wound Vac;  Surgeon: Altamese Chatsworth, MD;  Location: [facility];  Service: Orthopedics;  Laterality:  Left;  . CARDIOVASCULAR STRESS TEST  09/30/2007   EF 67%  No ischemia.   Marland Kitchen CARDIOVERSION  05/01/2004  . KNEE ARTHROSCOPY Left 02/11/2019   Procedure: ARTHROSCOPY LEFT KNEE;  Surgeon: Altamese Millis-Clicquot, MD;  Location: [facility];  Service: Orthopedics;  Laterality: Left;  . LAPAROTOMY N/A 05/11/2019   Procedure: EXPLORATORY LAPAROTOMY;  Surgeon: Rolm Bookbinder, MD;  Location: [facility];  Service: General;  Laterality: N/A;  . LYSIS OF ADHESION N/A 05/11/2019   Procedure: LYSIS OF ADHESION;  Surgeon: Rolm Bookbinder, MD;  Location: [facility];  Service: General;  Laterality: N/A;  . PATELLAR TENDON REPAIR Left 02/11/2019   Procedure: LEFT PATELLA TENDON REPAIR;  Surgeon: Altamese Maplesville, MD;  Location: [facility];  Service: Orthopedics;  Laterality: Left;  . SMALL INTESTINE SURGERY  2004  . US ECHOCARDIOGRAPHY  09/05/2009   ef 55-60%    There were no vitals filed for this visit.  Subjective Assessment - 01/06/20 1325    Subjective  No new complaints. Again was able to walk into lobby, then from lobby to gym with RW.    Patient is accompained by:  Family member   spouse Russell Dillon, caregiver in lobby   Pertinent History  PMH: recent hospitilization 05/22/19 atrial fibrillation, dementia, essential hypertension, HLD, diastolic congestive heart failure, aortic valve regurgitation, BPH, and recent small bowel obstruction with recent surgical intervention with lysis of  adhesions    Limitations  Walking;Standing    How long can you stand comfortably?  can not stand on his own    How long can you walk comfortably?  can not walk on his own    Patient Stated Goals  to walk again    Currently in Pain?  No/denies    Pain Score  0-No pain             [facility] Adult PT Treatment/Exercise - 01/06/20 1327      Transfers   Transfers  Sit to Stand;Stand to Sit    Sit to Stand  4: Min guard;With upper extremity assist;From chair/3-in-1    Stand to Sit  4: Min guard;With upper extremity assist;To chair/3-in-1       Ambulation/Gait   Ambulation/Gait  Yes    Ambulation/Gait Assistance  4: Min guard;4: Min assist    Ambulation/Gait Assistance Details  no w/c follow with entering/exiting gym with RW, did have wheelchair with outdoor gait with no need until back in the gym. moderate reminder cues for posture, walker position and walker negotiation over cracks in the sidewalk outdoors.     Ambulation Distance (Feet)  450 Feet   x1 in/outdoor without resting, 120 x1, 100 x1   Assistive device  Rolling walker    Gait Pattern  Step-through pattern;Narrow base of support;Trunk flexed    Ambulation Surface  Level;Unlevel;Indoor;Outdoor;Paved    Stairs  Yes    Stairs Assistance  4: Min assist;Other (comment)   second person standby/min assist   Stairs Assistance Details (indicate cue type and reason)  cues to advance hands on rails to promote increased weight shifting with stair negotiation. cues on posture as well.  second person as stand by for safety.     Stair Management Technique  Two rails;Step to pattern;Forwards    Number of Stairs  4   x2   Height of Stairs  6      Knee/Hip Exercises: Aerobic   Other Aerobic  SciFit with BLE and BUEs at level 5.0 for 8 minutes for strengthening, endurance, ROM, and activity tolerance.                PT Short Term Goals - 12/22/19 1544      PT SHORT TERM GOAL #1   Title  Pt will be able to ambulate at least 300' with RW with supervision in order to improve mobility. ALL STGS DUE 01/19/20    Baseline  350' with min guard/min A    Time  4    Period  Weeks    Status  New    Target Date  01/19/20      PT SHORT TERM GOAL #2   Title  Pt will be able to perform sit to stand with supersion with RW from w/c and mat table.    Baseline  --    Time  4    Period  Weeks    Status  New      PT SHORT TERM GOAL #3   Title  Pt will be able to perform 4 steps with step to pattern and B handrails with min guard.    Baseline  4 steps - min A ascending, min/mod A when  descending on 12/22/19    Time  4    Period  Weeks        PT Long Term Goals - 12/22/19 1547      PT LONG TERM GOAL #1   Title  Pt will be I and compliant with final HEP. (target for all LTG 12 weeks 02/16/20)    Time  8    Period  Weeks    Status  New    Target Date  02/16/20      PT LONG TERM GOAL #2   Title  Pt will be able to perform sit to stand mod I.    Baseline  min guard/ one episode of min A from RW.    Time  8    Period  Weeks    Status  On-going      PT LONG TERM GOAL #3   Title  Pt will be able perform stand pivot transfers with RW with supervision to decrease caregiver burden.    Baseline  not formally assessed today.    Time  8    Period  Weeks    Status  New      PT LONG TERM GOAL #4   Title  Pt will be able to ambulate 300' over level indoor and unlevel outdoor paved surfaces with supervision/min guard in order to improve functional mobility.    Time  8    Period  Weeks    Status  New      PT LONG TERM GOAL #5   Title  Pt will improve gait speed to at least 2.6 ft/sec to demo improved functional mobility.    Baseline  2.11 ft/sec on 12/22/19    Time  8    Period  Weeks    Status  New      PT LONG TERM GOAL #6   Title  Pt will perform 12 steps with step to pattern and BUE assist on handrails with min guard in order to perform stairs to reach the 2nd floor of his house.    Baseline  --    Time  8    Period  Weeks    Status  New            Plan - 01/06/20 1326    Clinical Impression Statement  Today's skilled session continued to focus on strengthening, activity tolerance and gait/barriers. Less assistance and cues needed with gait/stairs. The pt is making steady progress toward goals and should benefit from continued PT to progress toward unmet goals.    Personal Factors and Comorbidities  Age;Comorbidity 1;Comorbidity 2;Comorbidity 3+;Past/Current Experience;Fitness    Comorbidities  PMH: recent hospitilization 05/22/19 atrial fibrillation,  dementia, essential hypertension, HLD, diastolic congestive heart failure, aortic valve regurgitation, BPH, and recent small bowel obstruction with recent surgical intervention with lysis of adhesions    Examination-Activity Limitations  --   all ADL's   Examination-Participation Restrictions  --   All   Stability/Clinical Decision Making  Evolving/Moderate complexity    Rehab Potential  Fair    PT Frequency  Other (comment)   2-3   PT Duration  12 weeks    PT Treatment/Interventions  ADLs/Self Care Home Management;Aquatic Therapy;Cryotherapy;Moist Heat;DME Instruction;Gait training;Functional mobility training;Therapeutic activities;Therapeutic exercise;Balance training;Neuromuscular re-education;Manual techniques;Wheelchair mobility training;Patient/family education;Passive range of motion;Taping    PT Next Visit Plan  continue with strengthening on Scifit, gait with RW, work on stairs with 2 person assist    PT [place], sitting marches, sitting hamstring curls, SLR, standing at kitchen sink- UE reaching, marching in place, mini squats    Consulted and Agree with Plan of Care  Patient;Family member/caregiver    Family Member Consulted  wife  Patient will benefit from skilled therapeutic intervention in order to improve the following deficits and impairments:  Decreased balance, Abnormal gait, Decreased endurance, Decreased range of motion, Decreased strength, Decreased mobility, Difficulty walking, Postural dysfunction, Pain  Visit Diagnosis: Other abnormalities of gait and mobility  Muscle weakness (generalized)     Problem List Patient Active Problem List   Diagnosis Date Noted  . Bacteremia due to Enterococcus 05/24/2019  . Urinary tract infection 05/24/2019  . Chronic diastolic CHF (congestive heart failure) ([place]) 05/22/2019  . Gout 05/22/2019  . Sepsis ([place]) 05/22/2019  . Protein-calorie malnutrition, severe 05/18/2019  . AKI (acute kidney injury)  ([place]) 05/05/2019  . Transient hypotension 05/05/2019  . Sepsis, Gram negative ([place]) 03/18/2019  . Hypoalbuminemia   . Leukocytosis   . Hyperglycemia   . Persistent atrial fibrillation with RVR ([place])   . Acute on chronic anemia   . Rupture of left patellar tendon 02/15/2019  . Postoperative pain   . Bipolar 1 disorder ([place]) 02/14/2019  . Dementia without behavioral disturbance ([place]) 02/14/2019  . Patellar tendon rupture, left, initial encounter 02/10/2019  . SIRS (systemic inflammatory response syndrome) ([place]) 02/09/2019  . Osteomyelitis ([place]) 02/09/2019  . Acute blood loss anemia 02/09/2019  . Aortic valve regurgitation 10/16/2018  . SBO (small bowel obstruction) ([place]) 07/08/2013  . Hyperlipidemia 12/20/2011  . HTN (hypertension) 06/04/2011  . Atrial fibrillation, chronic ([place]) 03/01/2011  . Long term (current) use of anticoagulants 03/01/2011  . GERD 10/11/2009  . DIVERTICULOSIS OF COLON 10/11/2009    Willow Ora, PTA, [facility] [address] [place], [place] [address] [phone] 01/07/20, 3:54 PM   Name: Russell Dillon MRN: [id number] Date of Birth: 04/12/1946

## 2020-01-11 ENCOUNTER — Encounter: Payer: Self-pay | Admitting: Physical Therapy

## 2020-01-11 ENCOUNTER — Ambulatory Visit: Payer: Medicare Other | Attending: Internal Medicine | Admitting: Physical Therapy

## 2020-01-11 ENCOUNTER — Other Ambulatory Visit: Payer: Self-pay

## 2020-01-11 DIAGNOSIS — R2689 Other abnormalities of gait and mobility: Secondary | ICD-10-CM | POA: Diagnosis not present

## 2020-01-11 DIAGNOSIS — G8929 Other chronic pain: Secondary | ICD-10-CM | POA: Diagnosis not present

## 2020-01-11 DIAGNOSIS — M6281 Muscle weakness (generalized): Secondary | ICD-10-CM | POA: Diagnosis not present

## 2020-01-11 DIAGNOSIS — M25562 Pain in left knee: Secondary | ICD-10-CM | POA: Insufficient documentation

## 2020-01-11 NOTE — Therapy (Signed)
[facility] [address] [place], [place], [address] Phone: [phone]   Fax:  [phone]  Physical Therapy Treatment  Patient Details  Name: Russell Dillon MRN: [id number] Date of Birth: 03-31-1946 Referring Provider (PT): Marton Redwood, MD   Encounter Date: 01/11/2020  PT End of Session - 01/11/20 1458    Visit Number  43    Number of Visits  60   recert for 16 more visits due to progress   Date for PT Re-Evaluation  03/21/20   written for 60 day POC   Authorization Type  MCR, AARP, 10th visit progress notes    PT Start Time  1447    PT Stop Time  1526    PT Time Calculation (min)  39 min    Equipment Utilized During Treatment  Gait belt    Activity Tolerance  Patient tolerated treatment well;No increased pain    Behavior During Therapy  WFL for tasks assessed/performed       Past Medical History:  Diagnosis Date  . Acute metabolic encephalopathy 5/72/6203  . Aortic valve regurgitation 10/16/2018   Echo 07/28/2017:  EF 60-65, moderate AI, aortic root and ascending aorta mildly dilated (40 mm), ascending aorta 43 mm, MAC, mild MR, mild LAE, moderate RV enlargement, trivial TR // Echo 12/19: EF 60-65, normal wall motion, mild AI, moderate BAE, ascending aorta 43 mm, aortic root 39 mm  . BPH (benign prostatic hyperplasia)   . BPH (benign prostatic hypertrophy)   . Chronic anticoagulation   . Chronic atrial fibrillation ([place])   . Diastolic dysfunction October 2010   Normal LV systolic function  . Hx SBO   . Hyperlipidemia   . Hypertension   . Patellar tendon rupture, left, initial encounter 02/10/2019  . Severe sepsis ([place]) 05/05/2019  . Small bowel obstruction [facility])     Past Surgical History:  Procedure Laterality Date  . APPENDECTOMY  2004  . APPLICATION OF WOUND VAC Left 02/11/2019   Procedure: Application Of Wound Vac;  Surgeon: Altamese Keaau, MD;  Location: [facility];  Service: Orthopedics;  Laterality:  Left;  . CARDIOVASCULAR STRESS TEST  09/30/2007   EF 67%  No ischemia.   Marland Kitchen CARDIOVERSION  05/01/2004  . KNEE ARTHROSCOPY Left 02/11/2019   Procedure: ARTHROSCOPY LEFT KNEE;  Surgeon: Altamese [place]acksburg, MD;  Location: [facility];  Service: Orthopedics;  Laterality: Left;  . LAPAROTOMY N/A 05/11/2019   Procedure: EXPLORATORY LAPAROTOMY;  Surgeon: Rolm Bookbinder, MD;  Location: [facility];  Service: General;  Laterality: N/A;  . LYSIS OF ADHESION N/A 05/11/2019   Procedure: LYSIS OF ADHESION;  Surgeon: Rolm Bookbinder, MD;  Location: [facility];  Service: General;  Laterality: N/A;  . PATELLAR TENDON REPAIR Left 02/11/2019   Procedure: LEFT PATELLA TENDON REPAIR;  Surgeon: Altamese [place], MD;  Location: [facility];  Service: Orthopedics;  Laterality: Left;  . SMALL INTESTINE SURGERY  2004  . US ECHOCARDIOGRAPHY  09/05/2009   ef 55-60%    There were no vitals filed for this visit.  Subjective Assessment - 01/11/20 1455    Subjective  No new complains. No falls. [name] reports they are walking without w/c follow at home more.    Patient is accompained by:  Family member    Pertinent History  PMH: recent hospitilization 05/22/19 atrial fibrillation, dementia, essential hypertension, HLD, diastolic congestive heart failure, aortic valve regurgitation, BPH, and recent small bowel obstruction with recent surgical intervention with lysis of adhesions    Limitations  Walking;Standing  [facility] Adult PT Treatment/Exercise - 01/11/20 1458      Transfers   Transfers  Sit to Stand;Stand to Sit    Sit to Stand  4: Min guard;With upper extremity assist;From chair/3-in-1    Sit to Stand Details  Verbal cues for technique    Sit to Stand Details (indicate cue type and reason)  cues to scoot closer to edge of seat to make standing easier    Stand to Sit  4: Min guard;With upper extremity assist;To chair/3-in-1    Stand to Sit Details (indicate cue type and reason)  Verbal cues for technique    Stand to Sit Details   cues to ensure fully at surface prior to sitting after doing 180* turn      Ambulation/Gait   Ambulation/Gait  Yes    Ambulation/Gait Assistance  4: Min guard;4: Min assist    Ambulation/Gait Assistance Details  continued with no wheelchair follow for entire session. cues needed to stay closer to walker and to avoid obstacles. pt able to self maneuver the walker once cued.  worked on incorporating right,/left turns, narrow spaces and 180* turns to sit down once to surface to sit on.     Ambulation Distance (Feet)  110 Feet   x2, 430 x1   Assistive device  Rolling walker    Gait Pattern  Step-through pattern;Narrow base of support;Trunk flexed    Ambulation Surface  Level;Indoor    Stairs  Yes    Stairs Assistance  4: Min guard;4: Min assist    Stairs Assistance Details (indicate cue type and reason)  min guard to ascend, min assist with descent with cues to advance hands further down the rails. min/mod assist for pivot to sit at end of stairs with no AD both times.     Stair Management Technique  Two rails;Step to pattern;Forwards    Number of Stairs  4   x2   Height of Stairs  6      High Level Balance   High Level Balance Activities  Side stepping;Marching forwards;Backward walking    High Level Balance Comments  3 laps each with UE support on bars. multi modal cues needed on form, technique and to task. min guard to min assist for balance.       Knee/Hip Exercises: Aerobic   Other Aerobic  SciFit with BLE and BUEs at level 6.0 for 10 minutes for strengthening, endurance, ROM, and activity tolerance.            PT Short Term Goals - 12/22/19 1544      PT SHORT TERM GOAL #1   Title  Pt will be able to ambulate at least 300' with RW with supervision in order to improve mobility. ALL STGS DUE 01/19/20    Baseline  350' with min guard/min A    Time  4    Period  Weeks    Status  New    Target Date  01/19/20      PT SHORT TERM GOAL #2   Title  Pt will be able to perform sit to  stand with supersion with RW from w/c and mat table.    Baseline  --    Time  4    Period  Weeks    Status  New      PT SHORT TERM GOAL #3   Title  Pt will be able to perform 4 steps with step to pattern and B handrails with min guard.  Baseline  4 steps - min A ascending, min/mod A when descending on 12/22/19    Time  4    Period  Weeks        PT Long Term Goals - 12/22/19 1547      PT LONG TERM GOAL #1   Title  Pt will be I and compliant with final HEP. (target for all LTG 12 weeks 02/16/20)    Time  8    Period  Weeks    Status  New    Target Date  02/16/20      PT LONG TERM GOAL #2   Title  Pt will be able to perform sit to stand mod I.    Baseline  min guard/ one episode of min A from RW.    Time  8    Period  Weeks    Status  On-going      PT LONG TERM GOAL #3   Title  Pt will be able perform stand pivot transfers with RW with supervision to decrease caregiver burden.    Baseline  not formally assessed today.    Time  8    Period  Weeks    Status  New      PT LONG TERM GOAL #4   Title  Pt will be able to ambulate 300' over level indoor and unlevel outdoor paved surfaces with supervision/min guard in order to improve functional mobility.    Time  8    Period  Weeks    Status  New      PT LONG TERM GOAL #5   Title  Pt will improve gait speed to at least 2.6 ft/sec to demo improved functional mobility.    Baseline  2.11 ft/sec on 12/22/19    Time  8    Period  Weeks    Status  New      PT LONG TERM GOAL #6   Title  Pt will perform 12 steps with step to pattern and BUE assist on handrails with min guard in order to perform stairs to reach the 2nd floor of his house.    Baseline  --    Time  8    Period  Weeks    Status  New            Plan - 01/11/20 1458    Clinical Impression Statement  Today's skilled session continued to focus on strengthening, gait with RW and stairs. Also began to address balance in parallel bars with demo/verbal/tactile cues  needed with new tasks. The pt is making steady progress toward goals and should benefit from continued PT to progress toward unmet goals.    Personal Factors and Comorbidities  Age;Comorbidity 1;Comorbidity 2;Comorbidity 3+;Past/Current Experience;Fitness    Comorbidities  PMH: recent hospitilization 05/22/19 atrial fibrillation, dementia, essential hypertension, HLD, diastolic congestive heart failure, aortic valve regurgitation, BPH, and recent small bowel obstruction with recent surgical intervention with lysis of adhesions    Examination-Activity Limitations  --   all ADL's   Examination-Participation Restrictions  --   All   Stability/Clinical Decision Making  Evolving/Moderate complexity    Rehab Potential  Fair    PT Frequency  Other (comment)   2-3   PT Duration  12 weeks    PT Treatment/Interventions  ADLs/Self Care Home Management;Aquatic Therapy;Cryotherapy;Moist Heat;DME Instruction;Gait training;Functional mobility training;Therapeutic activities;Therapeutic exercise;Balance training;Neuromuscular re-education;Manual techniques;Wheelchair mobility training;Patient/family education;Passive range of motion;Taping    PT Next Visit Plan  continue with strengthening on  Scifit, gait with RW, work on stairs with 2 person assist (second person for safety)    PT Home Exercise Plan  LAQ, sitting marches, sitting hamstring curls, SLR, standing at kitchen sink- UE reaching, marching in place, mini squats    Consulted and Agree with Plan of Care  Patient;Family member/caregiver    Family Member Consulted  wife       Patient will benefit from skilled therapeutic intervention in order to improve the following deficits and impairments:  Decreased balance, Abnormal gait, Decreased endurance, Decreased range of motion, Decreased strength, Decreased mobility, Difficulty walking, Postural dysfunction, Pain  Visit Diagnosis: Other abnormalities of gait and mobility  Muscle weakness  (generalized)     Problem List Patient Active Problem List   Diagnosis Date Noted  . Bacteremia due to Enterococcus 05/24/2019  . Urinary tract infection 05/24/2019  . Chronic diastolic CHF (congestive heart failure) ([place]) 05/22/2019  . Gout 05/22/2019  . Sepsis ([place]) 05/22/2019  . Protein-calorie malnutrition, severe 05/18/2019  . AKI (acute kidney injury) ([place]) 05/05/2019  . Transient hypotension 05/05/2019  . Sepsis, Gram negative ([place]) 03/18/2019  . Hypoalbuminemia   . Leukocytosis   . Hyperglycemia   . Persistent atrial fibrillation with RVR ([place])   . Acute on chronic anemia   . Rupture of left patellar tendon 02/15/2019  . Postoperative pain   . Bipolar 1 disorder ([place]) 02/14/2019  . Dementia without behavioral disturbance ([place]) 02/14/2019  . Patellar tendon rupture, left, initial encounter 02/10/2019  . SIRS (systemic inflammatory response syndrome) ([place]) 02/09/2019  . Osteomyelitis ([place]) 02/09/2019  . Acute blood loss anemia 02/09/2019  . Aortic valve regurgitation 10/16/2018  . SBO (small bowel obstruction) ([place]) 07/08/2013  . Hyperlipidemia 12/20/2011  . HTN (hypertension) 06/04/2011  . Atrial fibrillation, chronic ([place]) 03/01/2011  . Long term (current) use of anticoagulants 03/01/2011  . GERD 10/11/2009  . DIVERTICULOSIS OF COLON 10/11/2009    Willow Ora, PTA, [facility] [address], [place], [place] [address] [phone] 01/11/20, 5:49 PM   Name: Russell Dillon MRN: [id number] Date of Birth: April 18, 1946

## 2020-01-14 ENCOUNTER — Other Ambulatory Visit: Payer: Self-pay

## 2020-01-14 ENCOUNTER — Ambulatory Visit: Payer: Medicare Other | Admitting: Physical Therapy

## 2020-01-14 ENCOUNTER — Encounter: Payer: Self-pay | Admitting: Physical Therapy

## 2020-01-14 DIAGNOSIS — G8929 Other chronic pain: Secondary | ICD-10-CM | POA: Diagnosis not present

## 2020-01-14 DIAGNOSIS — M25562 Pain in left knee: Secondary | ICD-10-CM | POA: Diagnosis not present

## 2020-01-14 DIAGNOSIS — M6281 Muscle weakness (generalized): Secondary | ICD-10-CM

## 2020-01-14 DIAGNOSIS — R2689 Other abnormalities of gait and mobility: Secondary | ICD-10-CM

## 2020-01-14 NOTE — Therapy (Signed)
[facility] [address] [place], [place], [address] Phone: [phone]   Fax:  [phone]  Physical Therapy Treatment  Patient Details  Name: Russell Dillon MRN: [id number] Date of Birth: 19-Jul-1946 Referring Provider (PT): Marton Redwood, MD   Encounter Date: 01/14/2020  PT End of Session - 01/14/20 1405    Visit Number  44    Number of Visits  55   recert for 16 more visits due to progress   Date for PT Re-Evaluation  03/21/20   written for 60 day POC   Authorization Type  MCR, AARP, 10th visit progress notes    PT Start Time  1401    PT Stop Time  1443    PT Time Calculation (min)  42 min    Equipment Utilized During Treatment  Gait belt    Activity Tolerance  Patient tolerated treatment well;No increased pain    Behavior During Therapy  WFL for tasks assessed/performed       Past Medical History:  Diagnosis Date  . Acute metabolic encephalopathy 11/11/7508  . Aortic valve regurgitation 10/16/2018   Echo 07/28/2017:  EF 60-65, moderate AI, aortic root and ascending aorta mildly dilated (40 mm), ascending aorta 43 mm, MAC, mild MR, mild LAE, moderate RV enlargement, trivial TR // Echo 12/19: EF 60-65, normal wall motion, mild AI, moderate BAE, ascending aorta 43 mm, aortic root 39 mm  . BPH (benign prostatic hyperplasia)   . BPH (benign prostatic hypertrophy)   . Chronic anticoagulation   . Chronic atrial fibrillation ([place])   . Diastolic dysfunction October 2010   Normal LV systolic function  . Hx SBO   . Hyperlipidemia   . Hypertension   . Patellar tendon rupture, left, initial encounter 02/10/2019  . Severe sepsis ([place]) 05/05/2019  . Small bowel obstruction [facility])     Past Surgical History:  Procedure Laterality Date  . APPENDECTOMY  2004  . APPLICATION OF WOUND VAC Left 02/11/2019   Procedure: Application Of Wound Vac;  Surgeon: Altamese Wheatland, MD;  Location: [facility];  Service: Orthopedics;  Laterality:  Left;  . CARDIOVASCULAR STRESS TEST  09/30/2007   EF 67%  No ischemia.   Marland Kitchen CARDIOVERSION  05/01/2004  . KNEE ARTHROSCOPY Left 02/11/2019   Procedure: ARTHROSCOPY LEFT KNEE;  Surgeon: Altamese Ector, MD;  Location: [facility];  Service: Orthopedics;  Laterality: Left;  . LAPAROTOMY N/A 05/11/2019   Procedure: EXPLORATORY LAPAROTOMY;  Surgeon: Rolm Bookbinder, MD;  Location: [facility];  Service: General;  Laterality: N/A;  . LYSIS OF ADHESION N/A 05/11/2019   Procedure: LYSIS OF ADHESION;  Surgeon: Rolm Bookbinder, MD;  Location: [facility];  Service: General;  Laterality: N/A;  . PATELLAR TENDON REPAIR Left 02/11/2019   Procedure: LEFT PATELLA TENDON REPAIR;  Surgeon: Altamese [place], MD;  Location: [place] [facility];  Service: Orthopedics;  Laterality: Left;  . SMALL INTESTINE SURGERY  2004  . US ECHOCARDIOGRAPHY  09/05/2009   ef 55-60%    There were no vitals filed for this visit.  Subjective Assessment - 01/14/20 1401    Subjective  No new complaints. No falls or pain to report.    Patient is accompained by:  Family member    Pertinent History  PMH: recent hospitilization 05/22/19 atrial fibrillation, dementia, essential hypertension, HLD, diastolic congestive heart failure, aortic valve regurgitation, BPH, and recent small bowel obstruction with recent surgical intervention with lysis of adhesions    Limitations  Walking;Standing    How long can you  stand comfortably?  can not stand on his own    How long can you walk comfortably?  can not walk on his own    Patient Stated Goals  to walk again    Currently in Pain?  No/denies    Pain Score  0-No pain           [facility] Adult PT Treatment/Exercise - 01/14/20 1405      Transfers   Transfers  Sit to Stand;Stand to Sit    Sit to Stand  4: Min guard;With upper extremity assist;From chair/3-in-1    Sit to Stand Details  Verbal cues for technique    Stand to Sit  4: Min guard;With upper extremity assist;To chair/3-in-1    Stand to Sit Details (indicate cue  type and reason)  Verbal cues for technique      Ambulation/Gait   Ambulation/Gait  Yes    Ambulation/Gait Assistance  4: Min guard;4: Min assist    Ambulation/Gait Assistance Details  cues to stay closer to RW with gait, for posture and for technique on uneven pavement to prevent walker from getting stuck/caught. Wheelchair follow for outside only, however was not used.    Ambulation Distance (Feet)  110 Feet   x1, 500 x1, 100 x1   Assistive device  Rolling walker    Gait Pattern  Step-through pattern;Narrow base of support;Trunk flexed    Ambulation Surface  Level;Unlevel;Indoor;Outdoor;Paved    Stairs  Yes    Stairs Assistance  4: Min guard;4: Min assist    Stairs Assistance Details (indicate cue type and reason)  use of RW at end of steps with stepping off bottom step, then 90* turn sit in wheelchair with less assistance needed this way vs in previous sessions when pt would hold onto PTA to pivot. Continues to need cues to advance hands further down rails. Pt mostly doing step to pattern to ascend, however did demo reciprocal pattern for 2-3 steps with 1st rep.    Stair Management Technique  Two rails;Step to pattern;Forwards    Number of Stairs  4   x2   Height of Stairs  6    Curb  4: Min assist    Curb Details (indicate cue type and reason)  with RW with cues on sequencing and technique.       Knee/Hip Exercises: Aerobic   Other Aerobic  SciFit with BLE and BUEs at level 7.0 for 10 minutes for strengthening, endurance, ROM, and activity tolerance.            PT Short Term Goals - 12/22/19 1544      PT SHORT TERM GOAL #1   Title  Pt will be able to ambulate at least 300' with RW with supervision in order to improve mobility. ALL STGS DUE 01/19/20    Baseline  350' with min guard/min A    Time  4    Period  Weeks    Status  New    Target Date  01/19/20      PT SHORT TERM GOAL #2   Title  Pt will be able to perform sit to stand with supersion with RW from w/c and mat  table.    Baseline  --    Time  4    Period  Weeks    Status  New      PT SHORT TERM GOAL #3   Title  Pt will be able to perform 4 steps with step to pattern and B handrails  with min guard.    Baseline  4 steps - min A ascending, min/mod A when descending on 12/22/19    Time  4    Period  Weeks        PT Long Term Goals - 12/22/19 1547      PT LONG TERM GOAL #1   Title  Pt will be I and compliant with final HEP. (target for all LTG 12 weeks 02/16/20)    Time  8    Period  Weeks    Status  New    Target Date  02/16/20      PT LONG TERM GOAL #2   Title  Pt will be able to perform sit to stand mod I.    Baseline  min guard/ one episode of min A from RW.    Time  8    Period  Weeks    Status  On-going      PT LONG TERM GOAL #3   Title  Pt will be able perform stand pivot transfers with RW with supervision to decrease caregiver burden.    Baseline  not formally assessed today.    Time  8    Period  Weeks    Status  New      PT LONG TERM GOAL #4   Title  Pt will be able to ambulate 300' over level indoor and unlevel outdoor paved surfaces with supervision/min guard in order to improve functional mobility.    Time  8    Period  Weeks    Status  New      PT LONG TERM GOAL #5   Title  Pt will improve gait speed to at least 2.6 ft/sec to demo improved functional mobility.    Baseline  2.11 ft/sec on 12/22/19    Time  8    Period  Weeks    Status  New      PT LONG TERM GOAL #6   Title  Pt will perform 12 steps with step to pattern and BUE assist on handrails with min guard in order to perform stairs to reach the 2nd floor of his house.    Baseline  --    Time  8    Period  Weeks    Status  New            Plan - 01/14/20 1405    Clinical Impression Statement  Today's skilled session continued to focus on activity tolerance, strengthening and gait/barriers. Initiated Water engineer with RW today with min guard to min assist for balance with cues on sequencing. The pt  is progressing well toward goals and should benefit from continued PT to progress toward unmet goals.    Personal Factors and Comorbidities  Age;Comorbidity 1;Comorbidity 2;Comorbidity 3+;Past/Current Experience;Fitness    Comorbidities  PMH: recent hospitilization 05/22/19 atrial fibrillation, dementia, essential hypertension, HLD, diastolic congestive heart failure, aortic valve regurgitation, BPH, and recent small bowel obstruction with recent surgical intervention with lysis of adhesions    Examination-Activity Limitations  --   all ADL's   Examination-Participation Restrictions  --   All   Stability/Clinical Decision Making  Evolving/Moderate complexity    Rehab Potential  Fair    PT Frequency  Other (comment)   2-3   PT Duration  12 weeks    PT Treatment/Interventions  ADLs/Self Care Home Management;Aquatic Therapy;Cryotherapy;Moist Heat;DME Instruction;Gait training;Functional mobility training;Therapeutic activities;Therapeutic exercise;Balance training;Neuromuscular re-education;Manual techniques;Wheelchair mobility training;Patient/family education;Passive range of motion;Taping    PT Next  Visit Plan  continue with strengthening on Scifit, gait with RW, work on stairs with 2 person assist (second person for safety)    PT Home Exercise Plan  LAQ, sitting marches, sitting hamstring curls, SLR, standing at kitchen sink- UE reaching, marching in place, mini squats    Consulted and Agree with Plan of Care  Patient;Family member/caregiver    Family Member Consulted  wife       Patient will benefit from skilled therapeutic intervention in order to improve the following deficits and impairments:  Decreased balance, Abnormal gait, Decreased endurance, Decreased range of motion, Decreased strength, Decreased mobility, Difficulty walking, Postural dysfunction, Pain  Visit Diagnosis: Other abnormalities of gait and mobility  Muscle weakness (generalized)     Problem List Patient Active  Problem List   Diagnosis Date Noted  . Bacteremia due to Enterococcus 05/24/2019  . Urinary tract infection 05/24/2019  . Chronic diastolic CHF (congestive heart failure) ([place]) 05/22/2019  . Gout 05/22/2019  . Sepsis ([place]) 05/22/2019  . Protein-calorie malnutrition, severe 05/18/2019  . AKI (acute kidney injury) ([place]) 05/05/2019  . Transient hypotension 05/05/2019  . Sepsis, Gram negative ([place]) 03/18/2019  . Hypoalbuminemia   . Leukocytosis   . Hyperglycemia   . Persistent atrial fibrillation with RVR ([place])   . Acute on chronic anemia   . Rupture of left patellar tendon 02/15/2019  . Postoperative pain   . Bipolar 1 disorder ([place]) 02/14/2019  . Dementia without behavioral disturbance ([place]) 02/14/2019  . Patellar tendon rupture, left, initial encounter 02/10/2019  . SIRS (systemic inflammatory response syndrome) ([place]) 02/09/2019  . Osteomyelitis ([place]) 02/09/2019  . Acute blood loss anemia 02/09/2019  . Aortic valve regurgitation 10/16/2018  . SBO (small bowel obstruction) ([place]) 07/08/2013  . Hyperlipidemia 12/20/2011  . HTN (hypertension) 06/04/2011  . Atrial fibrillation, chronic ([place]) 03/01/2011  . Long term (current) use of anticoagulants 03/01/2011  . GERD 10/11/2009  . DIVERTICULOSIS OF COLON 10/11/2009   Willow Ora, PTA, [facility] [address], [place], [place] [address] [phone] 01/14/20, 8:40 PM   Name: Russell Dillon MRN: [id number] Date of Birth: [birth year]

## 2020-01-16 ENCOUNTER — Ambulatory Visit: Payer: Medicare Other | Attending: Internal Medicine

## 2020-01-16 DIAGNOSIS — Z23 Encounter for immunization: Secondary | ICD-10-CM

## 2020-01-16 NOTE — Progress Notes (Signed)
   Covid-19 Vaccination Clinic  Name:  Russell Dillon    MRN: [id number] DOB: 07-15-1946  01/16/2020  [name] was observed post Covid-19 immunization for 15 minutes without incident. He was provided with Vaccine Information Sheet and instruction to access the V-Safe system.   [name] was instructed to call 911 with any severe reactions post vaccine: Marland Kitchen Difficulty breathing  . Swelling of face and throat  . A fast heartbeat  . A bad rash all over body  . Dizziness and weakness   Immunizations Administered    Name Date Dose VIS Date Route   Pfizer COVID-19 Vaccine 01/16/2020  1:57 PM 0.3 mL 10/22/2019 Intramuscular   Manufacturer: [place]   Lot: [id number]   [place]: [id number]

## 2020-01-17 DIAGNOSIS — N390 Urinary tract infection, site not specified: Secondary | ICD-10-CM | POA: Diagnosis not present

## 2020-01-18 ENCOUNTER — Other Ambulatory Visit: Payer: Self-pay

## 2020-01-18 ENCOUNTER — Ambulatory Visit: Payer: Medicare Other | Admitting: Physical Therapy

## 2020-01-18 ENCOUNTER — Encounter: Payer: Self-pay | Admitting: Physical Therapy

## 2020-01-18 DIAGNOSIS — M6281 Muscle weakness (generalized): Secondary | ICD-10-CM | POA: Diagnosis not present

## 2020-01-18 DIAGNOSIS — R2689 Other abnormalities of gait and mobility: Secondary | ICD-10-CM

## 2020-01-18 DIAGNOSIS — G8929 Other chronic pain: Secondary | ICD-10-CM | POA: Diagnosis not present

## 2020-01-18 DIAGNOSIS — M25562 Pain in left knee: Secondary | ICD-10-CM | POA: Diagnosis not present

## 2020-01-19 NOTE — Therapy (Signed)
[facility] [address] [place], [place], [address] Phone: [phone]   Fax:  [phone]  Physical Therapy Treatment  Patient Details  Name: Russell Dillon MRN: [id number] Date of Birth: 1946/01/08 Referring Provider (PT): Marton Redwood, MD   Encounter Date: 01/18/2020  PT End of Session - 01/18/20 1452    Visit Number  45    Number of Visits  87   recert for 16 more visits due to progress   Date for PT Re-Evaluation  03/21/20   written for 60 day POC   Authorization Type  MCR, AARP, 10th visit progress notes    PT Start Time  1448    PT Stop Time  1520   pt requesting to end "that's about all I can do".   PT Time Calculation (min)  32 min    Equipment Utilized During Treatment  Gait belt    Activity Tolerance  Patient tolerated treatment well;No increased pain    Behavior During Therapy  WFL for tasks assessed/performed       Past Medical History:  Diagnosis Date  . Acute metabolic encephalopathy 7/82/4235  . Aortic valve regurgitation 10/16/2018   Echo 07/28/2017:  EF 60-65, moderate AI, aortic root and ascending aorta mildly dilated (40 mm), ascending aorta 43 mm, MAC, mild MR, mild LAE, moderate RV enlargement, trivial TR // Echo 12/19: EF 60-65, normal wall motion, mild AI, moderate BAE, ascending aorta 43 mm, aortic root 39 mm  . BPH (benign prostatic hyperplasia)   . BPH (benign prostatic hypertrophy)   . Chronic anticoagulation   . Chronic atrial fibrillation ([place])   . Diastolic dysfunction October 2010   Normal LV systolic function  . Hx SBO   . Hyperlipidemia   . Hypertension   . Patellar tendon rupture, left, initial encounter 02/10/2019  . Severe sepsis ([place]) 05/05/2019  . Small bowel obstruction [facility])     Past Surgical History:  Procedure Laterality Date  . APPENDECTOMY  2004  . APPLICATION OF WOUND VAC Left 02/11/2019   Procedure: Application Of Wound Vac;  Surgeon: Altamese [place], MD;   Location: [facility];  Service: Orthopedics;  Laterality: Left;  . CARDIOVASCULAR STRESS TEST  09/30/2007   EF 67%  No ischemia.   Marland Kitchen CARDIOVERSION  05/01/2004  . KNEE ARTHROSCOPY Left 02/11/2019   Procedure: ARTHROSCOPY LEFT KNEE;  Surgeon: Altamese [place], MD;  Location: [facility];  Service: Orthopedics;  Laterality: Left;  . LAPAROTOMY N/A 05/11/2019   Procedure: EXPLORATORY LAPAROTOMY;  Surgeon: Rolm Bookbinder, MD;  Location: [facility];  Service: General;  Laterality: N/A;  . LYSIS OF ADHESION N/A 05/11/2019   Procedure: LYSIS OF ADHESION;  Surgeon: Rolm Bookbinder, MD;  Location: [facility];  Service: General;  Laterality: N/A;  . PATELLAR TENDON REPAIR Left 02/11/2019   Procedure: LEFT PATELLA TENDON REPAIR;  Surgeon: Altamese [place], MD;  Location: [facility];  Service: Orthopedics;  Laterality: Left;  . SMALL INTESTINE SURGERY  2004  . US ECHOCARDIOGRAPHY  09/05/2009   ef 55-60%    There were no vitals filed for this visit.  Subjective Assessment - 01/18/20 1452    Subjective  No new complaints. No falls or pain to report. Had his first covid vacinne on Sunday. Daughter reports it has made him more tired. He also has an UTI he is being treated for.    Patient is accompained by:  Family member   daughter in lobby   Pertinent History  PMH: recent hospitilization 05/22/19  atrial fibrillation, dementia, essential hypertension, HLD, diastolic congestive heart failure, aortic valve regurgitation, BPH, and recent small bowel obstruction with recent surgical intervention with lysis of adhesions    Limitations  Walking;Standing    How long can you stand comfortably?  can not stand on his own    How long can you walk comfortably?  can not walk on his own    Patient Stated Goals  to walk again    Currently in Pain?  No/denies    Pain Score  0-No pain           [facility] Adult PT Treatment/Exercise - 01/18/20 1453      Transfers   Transfers  Sit to Stand;Stand to Sit    Sit to Stand  4: Min guard;With upper  extremity assist;From chair/3-in-1    Sit to Stand Details  Verbal cues for technique    Stand to Sit  4: Min guard;With upper extremity assist;To chair/3-in-1    Stand to Sit Details (indicate cue type and reason)  Verbal cues for technique      Ambulation/Gait   Ambulation/Gait  Yes    Ambulation/Gait Assistance  4: Min guard;4: Min assist    Ambulation/Gait Assistance Details  ~10 feet on grass with outdoor gait with up to min assist needed. min guard to close supervision with remaining gait with continued cues on walker proximity with gait.      Ambulation Distance (Feet)  110 Feet   x1, ~745 feet in/outdoors,    Assistive device  Rolling walker    Gait Pattern  Step-through pattern;Narrow base of support;Trunk flexed    Ambulation Surface  Level;Indoor;Unlevel;Outdoor;Paved;Grass    Stairs  Yes    Stairs Assistance  4: Min guard    Stairs Assistance Details (indicate cue type and reason)  continues to need cues to advance hands on rails to promote increased weight shifting.     Stair Management Technique  Two rails;Step to pattern;Forwards    Number of Stairs  4    Height of Stairs  6    Curb  4: Min assist;Other (comment)   min guard assist   Curb Details (indicate cue type and reason)  with RW: min guard to step down outdoor curb from sidewalk into parking lot, min assist to step up from parking lot onto grass section with cues on technique both times.       Knee/Hip Exercises: Aerobic   Other Aerobic  SciFit with BLE and BUEs at level 7.0 for 10 minutes for strengthening, endurance, ROM, and activity tolerance.                PT Short Term Goals - 12/22/19 1544      PT SHORT TERM GOAL #1   Title  Pt will be able to ambulate at least 300' with RW with supervision in order to improve mobility. ALL STGS DUE 01/19/20    Baseline  350' with min guard/min A    Time  4    Period  Weeks    Status  New    Target Date  01/19/20      PT SHORT TERM GOAL #2   Title  Pt will  be able to perform sit to stand with supersion with RW from w/c and mat table.    Baseline  --    Time  4    Period  Weeks    Status  New      PT SHORT TERM GOAL #3  Title  Pt will be able to perform 4 steps with step to pattern and B handrails with min guard.    Baseline  4 steps - min A ascending, min/mod A when descending on 12/22/19    Time  4    Period  Weeks        PT Long Term Goals - 12/22/19 1547      PT LONG TERM GOAL #1   Title  Pt will be I and compliant with final HEP. (target for all LTG 12 weeks 02/16/20)    Time  8    Period  Weeks    Status  New    Target Date  02/16/20      PT LONG TERM GOAL #2   Title  Pt will be able to perform sit to stand mod I.    Baseline  min guard/ one episode of min A from RW.    Time  8    Period  Weeks    Status  On-going      PT LONG TERM GOAL #3   Title  Pt will be able perform stand pivot transfers with RW with supervision to decrease caregiver burden.    Baseline  not formally assessed today.    Time  8    Period  Weeks    Status  New      PT LONG TERM GOAL #4   Title  Pt will be able to ambulate 300' over level indoor and unlevel outdoor paved surfaces with supervision/min guard in order to improve functional mobility.    Time  8    Period  Weeks    Status  New      PT LONG TERM GOAL #5   Title  Pt will improve gait speed to at least 2.6 ft/sec to demo improved functional mobility.    Baseline  2.11 ft/sec on 12/22/19    Time  8    Period  Weeks    Status  New      PT LONG TERM GOAL #6   Title  Pt will perform 12 steps with step to pattern and BUE assist on handrails with min guard in order to perform stairs to reach the 2nd floor of his house.    Baseline  --    Time  8    Period  Weeks    Status  New            Plan - 01/18/20 1452    Clinical Impression Statement  Today's skilled session continued to focus on activity tolerance, strengthening and gait/barriers. Pt able to increase his overall  consecutive distance with gait before needing a rest break this session. Pt requesting to end early in session due to fatigue stating "that's about all I can do". The pt is making steady progress toward goals and should benefit from continued PT to progress toward unmet goals.    Personal Factors and Comorbidities  Age;Comorbidity 1;Comorbidity 2;Comorbidity 3+;Past/Current Experience;Fitness    Comorbidities  PMH: recent hospitilization 05/22/19 atrial fibrillation, dementia, essential hypertension, HLD, diastolic congestive heart failure, aortic valve regurgitation, BPH, and recent small bowel obstruction with recent surgical intervention with lysis of adhesions    Examination-Activity Limitations  --   all ADL's   Examination-Participation Restrictions  --   All   Stability/Clinical Decision Making  Evolving/Moderate complexity    Rehab Potential  Fair    PT Frequency  Other (comment)   2-3   PT Duration  12 weeks    PT Treatment/Interventions  ADLs/Self Care Home Management;Aquatic Therapy;Cryotherapy;Moist Heat;DME Instruction;Gait training;Functional mobility training;Therapeutic activities;Therapeutic exercise;Balance training;Neuromuscular re-education;Manual techniques;Wheelchair mobility training;Patient/family education;Passive range of motion;Taping    PT Next Visit Plan  continue with strengthening on Scifit, gait with RW, work on stairs with 2 person assist (second person for safety)    PT Home Exercise Plan  LAQ, sitting marches, sitting hamstring curls, SLR, standing at kitchen sink- UE reaching, marching in place, mini squats    Consulted and Agree with Plan of Care  Patient;Family member/caregiver    Family Member Consulted  wife       Patient will benefit from skilled therapeutic intervention in order to improve the following deficits and impairments:  Decreased balance, Abnormal gait, Decreased endurance, Decreased range of motion, Decreased strength, Decreased mobility,  Difficulty walking, Postural dysfunction, Pain  Visit Diagnosis: Other abnormalities of gait and mobility  Muscle weakness (generalized)     Problem List Patient Active Problem List   Diagnosis Date Noted  . Bacteremia due to Enterococcus 05/24/2019  . Urinary tract infection 05/24/2019  . Chronic diastolic CHF (congestive heart failure) ([place]) 05/22/2019  . Gout 05/22/2019  . Sepsis ([place]) 05/22/2019  . Protein-calorie malnutrition, severe 05/18/2019  . AKI (acute kidney injury) ([place]) 05/05/2019  . Transient hypotension 05/05/2019  . Sepsis, Gram negative ([place]) 03/18/2019  . Hypoalbuminemia   . Leukocytosis   . Hyperglycemia   . Persistent atrial fibrillation with RVR ([place])   . Acute on chronic anemia   . Rupture of left patellar tendon 02/15/2019  . Postoperative pain   . Bipolar 1 disorder ([place]) 02/14/2019  . Dementia without behavioral disturbance ([place]) 02/14/2019  . Patellar tendon rupture, left, initial encounter 02/10/2019  . SIRS (systemic inflammatory response syndrome) ([place]) 02/09/2019  . Osteomyelitis ([place]) 02/09/2019  . Acute blood loss anemia 02/09/2019  . Aortic valve regurgitation 10/16/2018  . SBO (small bowel obstruction) ([place]) 07/08/2013  . Hyperlipidemia 12/20/2011  . HTN (hypertension) 06/04/2011  . Atrial fibrillation, chronic ([place]) 03/01/2011  . Long term (current) use of anticoagulants 03/01/2011  . GERD 10/11/2009  . DIVERTICULOSIS OF COLON 10/11/2009    Willow Ora, PTA, [facility] [address] [place], [place] [address] [phone] 01/19/20, 1:01 PM   Name: YAVIER SNIDER MRN: [id number] Date of Birth: 10/28/1946

## 2020-01-21 ENCOUNTER — Ambulatory Visit: Payer: Medicare Other | Admitting: Physical Therapy

## 2020-01-21 ENCOUNTER — Other Ambulatory Visit: Payer: Self-pay

## 2020-01-21 DIAGNOSIS — G8929 Other chronic pain: Secondary | ICD-10-CM

## 2020-01-21 DIAGNOSIS — M6281 Muscle weakness (generalized): Secondary | ICD-10-CM | POA: Diagnosis not present

## 2020-01-21 DIAGNOSIS — R2689 Other abnormalities of gait and mobility: Secondary | ICD-10-CM | POA: Diagnosis not present

## 2020-01-21 DIAGNOSIS — M25562 Pain in left knee: Secondary | ICD-10-CM | POA: Diagnosis not present

## 2020-01-21 NOTE — Therapy (Signed)
[facility] [address] [place], [place], [address] Phone: [phone]   Fax:  [phone]  Physical Therapy Treatment  Patient Details  Name: Russell Dillon MRN: [id number] Date of Birth: 11-Sep-1946 Referring Provider (PT): Marton Redwood, MD   Encounter Date: 01/21/2020  PT End of Session - 01/21/20 1445    Visit Number  46    Number of Visits  53   recert for 16 more visits due to progress   Date for PT Re-Evaluation  03/21/20   written for 60 day POC   Authorization Type  MCR, AARP, 10th visit progress notes    PT Start Time  1401    PT Stop Time  1434   pt requesting to end early after gait outside, "im done for today"   PT Time Calculation (min)  33 min    Equipment Utilized During Treatment  Gait belt    Activity Tolerance  Patient tolerated treatment well;No increased pain    Behavior During Therapy  WFL for tasks assessed/performed       Past Medical History:  Diagnosis Date  . Acute metabolic encephalopathy 3/00/7622  . Aortic valve regurgitation 10/16/2018   Echo 07/28/2017:  EF 60-65, moderate AI, aortic root and ascending aorta mildly dilated (40 mm), ascending aorta 43 mm, MAC, mild MR, mild LAE, moderate RV enlargement, trivial TR // Echo 12/19: EF 60-65, normal wall motion, mild AI, moderate BAE, ascending aorta 43 mm, aortic root 39 mm  . BPH (benign prostatic hyperplasia)   . BPH (benign prostatic hypertrophy)   . Chronic anticoagulation   . Chronic atrial fibrillation ([place])   . Diastolic dysfunction October 2010   Normal LV systolic function  . Hx SBO   . Hyperlipidemia   . Hypertension   . Patellar tendon rupture, left, initial encounter 02/10/2019  . Severe sepsis ([place]) 05/05/2019  . Small bowel obstruction [facility])     Past Surgical History:  Procedure Laterality Date  . APPENDECTOMY  2004  . APPLICATION OF WOUND VAC Left 02/11/2019   Procedure: Application Of Wound Vac;  Surgeon:  Altamese Wheeler, MD;  Location: [facility];  Service: Orthopedics;  Laterality: Left;  . CARDIOVASCULAR STRESS TEST  09/30/2007   EF 67%  No ischemia.   Marland Kitchen CARDIOVERSION  05/01/2004  . KNEE ARTHROSCOPY Left 02/11/2019   Procedure: ARTHROSCOPY LEFT KNEE;  Surgeon: Altamese [place], MD;  Location: [facility];  Service: Orthopedics;  Laterality: Left;  . LAPAROTOMY N/A 05/11/2019   Procedure: EXPLORATORY LAPAROTOMY;  Surgeon: Rolm Bookbinder, MD;  Location: [facility];  Service: General;  Laterality: N/A;  . LYSIS OF ADHESION N/A 05/11/2019   Procedure: LYSIS OF ADHESION;  Surgeon: Rolm Bookbinder, MD;  Location: [facility];  Service: General;  Laterality: N/A;  . PATELLAR TENDON REPAIR Left 02/11/2019   Procedure: LEFT PATELLA TENDON REPAIR;  Surgeon: Altamese [place], MD;  Location: [facility];  Service: Orthopedics;  Laterality: Left;  . SMALL INTESTINE SURGERY  2004  . US ECHOCARDIOGRAPHY  09/05/2009   ef 55-60%    There were no vitals filed for this visit.  Subjective Assessment - 01/21/20 1408    Subjective  Should be having a stair lift put in the next couple of weeks. Getting over the UTI. Doing more walking at home and even some walking outside.    Patient is accompained by:  Family member   daughter in lobby   Pertinent History  PMH: recent hospitilization 05/22/19 atrial fibrillation, dementia, essential hypertension,  HLD, diastolic congestive heart failure, aortic valve regurgitation, BPH, and recent small bowel obstruction with recent surgical intervention with lysis of adhesions    Limitations  Walking;Standing    How long can you stand comfortably?  can not stand on his own    How long can you walk comfortably?  can not walk on his own    Patient Stated Goals  to walk again    Currently in Pain?  No/denies                       [facility] Adult PT Treatment/Exercise - 01/21/20 1410      Transfers   Transfers  Sit to Stand;Stand to Sit    Sit to Stand  4: Min assist;4: Min guard    Sit to  Stand Details  Verbal cues for technique    Sit to Stand Details (indicate cue type and reason)  cues to scoot out towards edge of seat. needed one episode of min A after seated rest break outdoors in transport chair due to it being a lower surface    Stand to Sit  4: Min guard;With upper extremity assist;To chair/3-in-1    Stand to Sit Details (indicate cue type and reason)  Verbal cues for technique    Transfer Cueing  at end of session pt ambulated out to car with RW, needing min guard and cues for proper technique and turning in order to safely get into passenger seat      Ambulation/Gait   Ambulation/Gait  Yes    Ambulation/Gait Assistance  4: Min guard;5: Supervision    Ambulation/Gait Assistance Details  w/c follow during gait outdoors - pt needing a rest break due to fatigue. min guard/supervision for gait on pavement - pt continues to need cues and assist to help keep RW close to body, especially when going up stairs as pt tends to push too far forward anteriorly      Ambulation Distance (Feet)  115 Feet   approx. indoors, 650' approx. outside   Assistive device  Rolling walker    Gait Pattern  Step-through pattern;Narrow base of support;Trunk flexed    Ambulation Surface  Unlevel;Indoor;Outdoor;Paved;Level      Exercises   Other Exercises   SciFit with BLE and BUEs at level 7.0 for 10 minutes for strengthening, endurance, ROM, and activity tolerance.                 PT Short Term Goals - 01/21/20 1743      PT SHORT TERM GOAL #1   Title  Pt will be able to ambulate at least 300' with RW with supervision in order to improve mobility. ALL STGS DUE 01/19/20    Baseline  300' indoors with supervision, min guard over outdoor surfaces    Time  4    Period  Weeks    Status  Achieved    Target Date  01/19/20      PT SHORT TERM GOAL #2   Title  Pt will be able to perform sit to stand with supersion with RW from w/c and mat table.    Baseline  needing min guard with RW and  needed one episode of min A due to performing from lower transport chair    Time  4    Period  Weeks    Status  Not Met      PT SHORT TERM GOAL #3   Title  Pt will be able to perform  4 steps with step to pattern and B handrails with min guard.    Baseline  4 steps - min A ascending, min/mod A when descending on 12/22/19    Time  4    Period  Weeks        PT Long Term Goals - 12/22/19 1547      PT LONG TERM GOAL #1   Title  Pt will be I and compliant with final HEP. (target for all LTG 12 weeks 02/16/20)    Time  8    Period  Weeks    Status  New    Target Date  02/16/20      PT LONG TERM GOAL #2   Title  Pt will be able to perform sit to stand mod I.    Baseline  min guard/ one episode of min A from RW.    Time  8    Period  Weeks    Status  On-going      PT LONG TERM GOAL #3   Title  Pt will be able perform stand pivot transfers with RW with supervision to decrease caregiver burden.    Baseline  not formally assessed today.    Time  8    Period  Weeks    Status  New      PT LONG TERM GOAL #4   Title  Pt will be able to ambulate 300' over level indoor and unlevel outdoor paved surfaces with supervision/min guard in order to improve functional mobility.    Time  8    Period  Weeks    Status  New      PT LONG TERM GOAL #5   Title  Pt will improve gait speed to at least 2.6 ft/sec to demo improved functional mobility.    Baseline  2.11 ft/sec on 12/22/19    Time  8    Period  Weeks    Status  New      PT LONG TERM GOAL #6   Title  Pt will perform 12 steps with step to pattern and BUE assist on handrails with min guard in order to perform stairs to reach the 2nd floor of his house.    Baseline  --    Time  8    Period  Weeks    Status  New            Plan - 01/21/20 1741    Clinical Impression Statement  Focus of today's skilled session was activity tolerance, endurance, and gait with RW on outdoor paved surfaces. Pt only needing one seated rest break during  gait outside today.Pt requesting to end session early after ambulating approx. 650' outdoors, states he did a lot of moving/walking earlier and was "done for today". Pt achieved STG #1 in regards to gait with RW, did not meet STG #2 today in regards to sit <> stand, needed min guard and one episode of min A from lower transport chair. Pt remains motivated - will continue to progress towards LTGs.    Personal Factors and Comorbidities  Age;Comorbidity 1;Comorbidity 2;Comorbidity 3+;Past/Current Experience;Fitness    Comorbidities  PMH: recent hospitilization 05/22/19 atrial fibrillation, dementia, essential hypertension, HLD, diastolic congestive heart failure, aortic valve regurgitation, BPH, and recent small bowel obstruction with recent surgical intervention with lysis of adhesions    Examination-Activity Limitations  --   all ADL's   Examination-Participation Restrictions  --   All   Stability/Clinical Decision Making  Evolving/Moderate complexity  Rehab Potential  Fair    PT Frequency  Other (comment)   2-3   PT Duration  12 weeks    PT Treatment/Interventions  ADLs/Self Care Home Management;Aquatic Therapy;Cryotherapy;Moist Heat;DME Instruction;Gait training;Functional mobility training;Therapeutic activities;Therapeutic exercise;Balance training;Neuromuscular re-education;Manual techniques;Wheelchair mobility training;Patient/family education;Passive range of motion;Taping    PT Next Visit Plan  check last STG. continue with strengthening on Scifit, gait with RW, work on stairs with 2 person assist (second person for safety)    PT [place], sitting marches, sitting hamstring curls, SLR, standing at kitchen sink- UE reaching, marching in place, mini squats    Consulted and Agree with Plan of Care  Patient;Family member/caregiver    Family Member Consulted  wife       Patient will benefit from skilled therapeutic intervention in order to improve the following deficits and  impairments:  Decreased balance, Abnormal gait, Decreased endurance, Decreased range of motion, Decreased strength, Decreased mobility, Difficulty walking, Postural dysfunction, Pain  Visit Diagnosis: Chronic pain of left knee  Muscle weakness (generalized)  Other abnormalities of gait and mobility     Problem List Patient Active Problem List   Diagnosis Date Noted  . Bacteremia due to Enterococcus 05/24/2019  . Urinary tract infection 05/24/2019  . Chronic diastolic CHF (congestive heart failure) ([place]) 05/22/2019  . Gout 05/22/2019  . Sepsis ([place]) 05/22/2019  . Protein-calorie malnutrition, severe 05/18/2019  . AKI (acute kidney injury) ([place]) 05/05/2019  . Transient hypotension 05/05/2019  . Sepsis, Gram negative ([place]) 03/18/2019  . Hypoalbuminemia   . Leukocytosis   . Hyperglycemia   . Persistent atrial fibrillation with RVR ([place])   . Acute on chronic anemia   . Rupture of left patellar tendon 02/15/2019  . Postoperative pain   . Bipolar 1 disorder ([place]) 02/14/2019  . Dementia without behavioral disturbance ([place]) 02/14/2019  . Patellar tendon rupture, left, initial encounter 02/10/2019  . SIRS (systemic inflammatory response syndrome) ([place]) 02/09/2019  . Osteomyelitis ([place]) 02/09/2019  . Acute blood loss anemia 02/09/2019  . Aortic valve regurgitation 10/16/2018  . SBO (small bowel obstruction) ([place]) 07/08/2013  . Hyperlipidemia 12/20/2011  . HTN (hypertension) 06/04/2011  . Atrial fibrillation, chronic ([place]) 03/01/2011  . Long term (current) use of anticoagulants 03/01/2011  . GERD 10/11/2009  . DIVERTICULOSIS OF COLON 10/11/2009    Arliss Journey, PT, DPT  01/21/2020, 5:46 PM  [facility] [address] [place], [place], [address] Phone: [phone]   Fax:  [phone]  Name: Russell Dillon MRN: [id number] Date of Birth: 12-28-1945

## 2020-01-25 ENCOUNTER — Other Ambulatory Visit: Payer: Self-pay

## 2020-01-25 ENCOUNTER — Encounter: Payer: Self-pay | Admitting: Physical Therapy

## 2020-01-25 ENCOUNTER — Ambulatory Visit: Payer: Medicare Other | Admitting: Physical Therapy

## 2020-01-25 DIAGNOSIS — M25562 Pain in left knee: Secondary | ICD-10-CM | POA: Diagnosis not present

## 2020-01-25 DIAGNOSIS — M6281 Muscle weakness (generalized): Secondary | ICD-10-CM | POA: Diagnosis not present

## 2020-01-25 DIAGNOSIS — G8929 Other chronic pain: Secondary | ICD-10-CM | POA: Diagnosis not present

## 2020-01-25 DIAGNOSIS — R2689 Other abnormalities of gait and mobility: Secondary | ICD-10-CM | POA: Diagnosis not present

## 2020-01-26 DIAGNOSIS — R2689 Other abnormalities of gait and mobility: Secondary | ICD-10-CM | POA: Diagnosis not present

## 2020-01-26 DIAGNOSIS — M6281 Muscle weakness (generalized): Secondary | ICD-10-CM | POA: Diagnosis not present

## 2020-01-26 DIAGNOSIS — R5381 Other malaise: Secondary | ICD-10-CM | POA: Diagnosis not present

## 2020-01-26 DIAGNOSIS — I480 Paroxysmal atrial fibrillation: Secondary | ICD-10-CM | POA: Diagnosis not present

## 2020-01-26 DIAGNOSIS — F0390 Unspecified dementia without behavioral disturbance: Secondary | ICD-10-CM | POA: Diagnosis not present

## 2020-01-26 DIAGNOSIS — S86812S Strain of other muscle(s) and tendon(s) at lower leg level, left leg, sequela: Secondary | ICD-10-CM | POA: Diagnosis not present

## 2020-01-26 NOTE — Therapy (Signed)
[facility] [address] [place], [place], [address] Phone: [phone]   Fax:  [phone]  Physical Therapy Treatment  Patient Details  Name: Russell Dillon MRN: [id number] Date of Birth: 11-02-1946 Referring Provider (PT): Marton Redwood, MD   Encounter Date: 01/25/2020  PT End of Session - 01/25/20 1409    Visit Number  3    Number of Visits  45   recert for 16 more visits due to progress   Date for PT Re-Evaluation  03/21/20   written for 60 day POC   Authorization Type  MCR, AARP, 10th visit progress notes    Progress Note Due on Visit  50    PT Start Time  1403    PT Stop Time  1444    PT Time Calculation (min)  41 min    Equipment Utilized During Treatment  Gait belt    Activity Tolerance  Patient tolerated treatment well;No increased pain    Behavior During Therapy  WFL for tasks assessed/performed       Past Medical History:  Diagnosis Date  . Acute metabolic encephalopathy 5/39/7673  . Aortic valve regurgitation 10/16/2018   Echo 07/28/2017:  EF 60-65, moderate AI, aortic root and ascending aorta mildly dilated (40 mm), ascending aorta 43 mm, MAC, mild MR, mild LAE, moderate RV enlargement, trivial TR // Echo 12/19: EF 60-65, normal wall motion, mild AI, moderate BAE, ascending aorta 43 mm, aortic root 39 mm  . BPH (benign prostatic hyperplasia)   . BPH (benign prostatic hypertrophy)   . Chronic anticoagulation   . Chronic atrial fibrillation ([place])   . Diastolic dysfunction October 2010   Normal LV systolic function  . Hx SBO   . Hyperlipidemia   . Hypertension   . Patellar tendon rupture, left, initial encounter 02/10/2019  . Severe sepsis ([place]) 05/05/2019  . Small bowel obstruction [facility])     Past Surgical History:  Procedure Laterality Date  . APPENDECTOMY  2004  . APPLICATION OF WOUND VAC Left 02/11/2019   Procedure: Application Of Wound Vac;  Surgeon: Altamese [place], MD;  Location: [facility];   Service: Orthopedics;  Laterality: Left;  . CARDIOVASCULAR STRESS TEST  09/30/2007   EF 67%  No ischemia.   Marland Kitchen CARDIOVERSION  05/01/2004  . KNEE ARTHROSCOPY Left 02/11/2019   Procedure: ARTHROSCOPY LEFT KNEE;  Surgeon: Altamese [place], MD;  Location: [facility];  Service: Orthopedics;  Laterality: Left;  . LAPAROTOMY N/A 05/11/2019   Procedure: EXPLORATORY LAPAROTOMY;  Surgeon: Rolm Bookbinder, MD;  Location: [facility];  Service: General;  Laterality: N/A;  . LYSIS OF ADHESION N/A 05/11/2019   Procedure: LYSIS OF ADHESION;  Surgeon: Rolm Bookbinder, MD;  Location: [facility];  Service: General;  Laterality: N/A;  . PATELLAR TENDON REPAIR Left 02/11/2019   Procedure: LEFT PATELLA TENDON REPAIR;  Surgeon: Altamese [place], MD;  Location: [facility];  Service: Orthopedics;  Laterality: Left;  . SMALL INTESTINE SURGERY  2004  . US ECHOCARDIOGRAPHY  09/05/2009   ef 55-60%    There were no vitals filed for this visit.  Subjective Assessment - 01/25/20 1409    Subjective  No new complaints. No falls or pain to report.    Patient is accompained by:  Family member   spouse, [name]   Pertinent History  PMH: recent hospitilization 05/22/19 atrial fibrillation, dementia, essential hypertension, HLD, diastolic congestive heart failure, aortic valve regurgitation, BPH, and recent small bowel obstruction with recent surgical intervention with lysis of adhesions  Limitations  Walking;Standing    How long can you stand comfortably?  can not stand on his own    How long can you walk comfortably?  can not walk on his own    Patient Stated Goals  to walk again    Currently in Pain?  No/denies    Pain Score  0-No pain           [facility] Adult PT Treatment/Exercise - 01/25/20 1412      Transfers   Transfers  Sit to Stand;Stand to Sit    Sit to Stand  4: Min guard;With upper extremity assist;From bed;From chair/3-in-1    Sit to Stand Details  Verbal cues for technique    Sit to Stand Details (indicate cue type and reason)   reminder cues to scoot closer to edge prior to standing    Stand to Sit  4: Min guard;With upper extremity assist;To chair/3-in-1;To bed    Stand to Sit Details (indicate cue type and reason)  Verbal cues for technique    Stand to Sit Details  reminder cues to back up all the way prior to sitting down      Ambulation/Gait   Ambulation/Gait  Yes    Ambulation/Gait Assistance  4: Min guard;5: Supervision    Ambulation/Gait Assistance Details  reminder cues to stay closer to walker with session. no balance issues noted. did work on right/left turns and furniture/obstacle negotiation with longer gait rep.     Ambulation Distance (Feet)  250 Feet   x1, plus around gym with session   Assistive device  Rolling walker    Gait Pattern  Step-through pattern;Narrow base of support;Trunk flexed    Ambulation Surface  Level;Indoor    Stairs  Yes    Stairs Assistance  4: Min guard    Stairs Assistance Details (indicate cue type and reason)  reminder cues to advance hands along rails to facilitate forward weight shifting. use of RW at bottom of steps to descend final step and then pivot to sit in chair with min guard to min assist with cues.     Stair Management Technique  Two rails;Step to pattern;Forwards    Number of Stairs  4   x2 reps   Height of Stairs  6      Exercises   Exercises  Other Exercises    Other Exercises   seated at edge of mat: with green theraband- rows for 10 reps, shoulder extension with elbow extension, then shoulder horizontal abduction for 10 reps. multimodal cues needed for correct ex's form/technique; with 1# hand weights-bicep curls for 10 reps, then "W"'s for 10 reps. cues for posture/ex form/technique.        Knee/Hip Exercises: Aerobic   Other Aerobic  SciFit with BLE and BUEs at level 7.0 for 10 minutes for strengthening, endurance, ROM, and activity tolerance.                PT Short Term Goals - 01/25/20 1410      PT SHORT TERM GOAL #1   Title  Pt will be  able to ambulate at least 300' with RW with supervision in order to improve mobility. ALL STGS DUE 01/19/20    Baseline  300' indoors with supervision, min guard over outdoor surfaces    Status  Achieved      PT SHORT TERM GOAL #2   Title  Pt will be able to perform sit to stand with supersion with RW from w/c and mat table.  Baseline  01/25/20: met in session today    Status  Achieved      PT SHORT TERM GOAL #3   Title  Pt will be able to perform 4 steps with step to pattern and B handrails with min guard.    Baseline  01/25/20: met in session today    Status  Achieved        PT Long Term Goals - 12/22/19 1547      PT LONG TERM GOAL #1   Title  Pt will be I and compliant with final HEP. (target for all LTG 12 weeks 02/16/20)    Time  8    Period  Weeks    Status  New    Target Date  02/16/20      PT LONG TERM GOAL #2   Title  Pt will be able to perform sit to stand mod I.    Baseline  min guard/ one episode of min A from RW.    Time  8    Period  Weeks    Status  On-going      PT LONG TERM GOAL #3   Title  Pt will be able perform stand pivot transfers with RW with supervision to decrease caregiver burden.    Baseline  not formally assessed today.    Time  8    Period  Weeks    Status  New      PT LONG TERM GOAL #4   Title  Pt will be able to ambulate 300' over level indoor and unlevel outdoor paved surfaces with supervision/min guard in order to improve functional mobility.    Time  8    Period  Weeks    Status  New      PT LONG TERM GOAL #5   Title  Pt will improve gait speed to at least 2.6 ft/sec to demo improved functional mobility.    Baseline  2.11 ft/sec on 12/22/19    Time  8    Period  Weeks    Status  New      PT LONG TERM GOAL #6   Title  Pt will perform 12 steps with step to pattern and BUE assist on handrails with min guard in order to perform stairs to reach the 2nd floor of his house.    Baseline  --    Time  8    Period  Weeks    Status  New             Plan - 01/25/20 1410    Clinical Impression Statement  STGs 2 and 3 met today. Today's skilled session continued to focus on gait with RW with emphasis on non straight pathways today, stair training with pt now a one person assist (second person nearby for safety) and general strengthening with returned emphasis to posture. Rest breaks taken as needed due to fatigue. The pt is progressing toward goals and should benefit from continued PT to progress toward unmet goals.    Personal Factors and Comorbidities  Age;Comorbidity 1;Comorbidity 2;Comorbidity 3+;Past/Current Experience;Fitness    Comorbidities  PMH: recent hospitilization 05/22/19 atrial fibrillation, dementia, essential hypertension, HLD, diastolic congestive heart failure, aortic valve regurgitation, BPH, and recent small bowel obstruction with recent surgical intervention with lysis of adhesions    Examination-Activity Limitations  --   all ADL's   Examination-Participation Restrictions  --   All   Stability/Clinical Decision Making  Evolving/Moderate complexity    Rehab Potential  Fair    PT Frequency  Other (comment)   2-3   PT Duration  12 weeks    PT Treatment/Interventions  ADLs/Self Care Home Management;Aquatic Therapy;Cryotherapy;Moist Heat;DME Instruction;Gait training;Functional mobility training;Therapeutic activities;Therapeutic exercise;Balance training;Neuromuscular re-education;Manual techniques;Wheelchair mobility training;Patient/family education;Passive range of motion;Taping    PT Next Visit Plan  check last STG. continue with strengthening on Scifit, gait with RW, work on stairs with 2 person assist (second person for safety)    PT [place], sitting marches, sitting hamstring curls, SLR, standing at kitchen sink- UE reaching, marching in place, mini squats    Consulted and Agree with Plan of Care  Patient;Family member/caregiver    Family Member Consulted  wife       Patient will  benefit from skilled therapeutic intervention in order to improve the following deficits and impairments:  Decreased balance, Abnormal gait, Decreased endurance, Decreased range of motion, Decreased strength, Decreased mobility, Difficulty walking, Postural dysfunction, Pain  Visit Diagnosis: Muscle weakness (generalized)  Other abnormalities of gait and mobility     Problem List Patient Active Problem List   Diagnosis Date Noted  . Bacteremia due to Enterococcus 05/24/2019  . Urinary tract infection 05/24/2019  . Chronic diastolic CHF (congestive heart failure) ([place]) 05/22/2019  . Gout 05/22/2019  . Sepsis ([place]) 05/22/2019  . Protein-calorie malnutrition, severe 05/18/2019  . AKI (acute kidney injury) ([place]) 05/05/2019  . Transient hypotension 05/05/2019  . Sepsis, Gram negative ([place]) 03/18/2019  . Hypoalbuminemia   . Leukocytosis   . Hyperglycemia   . Persistent atrial fibrillation with RVR ([place])   . Acute on chronic anemia   . Rupture of left patellar tendon 02/15/2019  . Postoperative pain   . Bipolar 1 disorder ([place]) 02/14/2019  . Dementia without behavioral disturbance ([place]) 02/14/2019  . Patellar tendon rupture, left, initial encounter 02/10/2019  . SIRS (systemic inflammatory response syndrome) ([place]) 02/09/2019  . Osteomyelitis ([place]) 02/09/2019  . Acute blood loss anemia 02/09/2019  . Aortic valve regurgitation 10/16/2018  . SBO (small bowel obstruction) ([place]) 07/08/2013  . Hyperlipidemia 12/20/2011  . HTN (hypertension) 06/04/2011  . Atrial fibrillation, chronic ([place]) 03/01/2011  . Long term (current) use of anticoagulants 03/01/2011  . GERD 10/11/2009  . DIVERTICULOSIS OF COLON 10/11/2009    Willow Ora, PTA, [facility] [address] [place], [place] [address] [phone] 01/26/20, 1:42 PM   Name: Russell Dillon MRN: [id number] Date of Birth: 11-Oct-1946

## 2020-01-28 ENCOUNTER — Ambulatory Visit: Payer: Medicare Other | Admitting: Physical Therapy

## 2020-01-28 ENCOUNTER — Other Ambulatory Visit: Payer: Self-pay

## 2020-01-28 DIAGNOSIS — M6281 Muscle weakness (generalized): Secondary | ICD-10-CM | POA: Diagnosis not present

## 2020-01-28 DIAGNOSIS — R2689 Other abnormalities of gait and mobility: Secondary | ICD-10-CM | POA: Diagnosis not present

## 2020-01-28 DIAGNOSIS — M25562 Pain in left knee: Secondary | ICD-10-CM | POA: Diagnosis not present

## 2020-01-28 DIAGNOSIS — G8929 Other chronic pain: Secondary | ICD-10-CM | POA: Diagnosis not present

## 2020-01-28 NOTE — Therapy (Signed)
[facility] [address] [place], [place], [address] Phone: [phone]   Fax:  [phone]  Physical Therapy Treatment  Patient Details  Name: Russell Dillon MRN: [id number] Date of Birth: 01/21/1946 Referring Provider (PT): Marton Redwood, MD   Encounter Date: 01/28/2020  PT End of Session - 01/28/20 1449    Visit Number  48    Number of Visits  4   recert for 16 more visits due to progress   Date for PT Re-Evaluation  03/21/20   written for 60 day POC   Authorization Type  MCR, [employer]ARP, 10th visit progress notes    Progress Note Due on Visit  20    PT Start Time  1401    PT Stop Time  1441    PT Time Calculation (min)  40 min    Equipment Utilized During Treatment  Gait belt    Activity Tolerance  Patient tolerated treatment well;No increased pain    Behavior During Therapy  WFL for tasks assessed/performed       Past Medical History:  Diagnosis Date  . Acute metabolic encephalopathy 7/67/2094  . Aortic valve regurgitation 10/16/2018   Echo 07/28/2017:  EF 60-65, moderate AI, aortic root and ascending aorta mildly dilated (40 mm), ascending aorta 43 mm, MAC, mild MR, mild LAE, moderate RV enlargement, trivial TR // Echo 12/19: EF 60-65, normal wall motion, mild AI, moderate BAE, ascending aorta 43 mm, aortic root 39 mm  . BPH (benign prostatic hyperplasia)   . BPH (benign prostatic hypertrophy)   . Chronic anticoagulation   . Chronic atrial fibrillation ([place])   . Diastolic dysfunction October 2010   Normal LV systolic function  . Hx SBO   . Hyperlipidemia   . Hypertension   . Patellar tendon rupture, left, initial encounter 02/10/2019  . Severe sepsis ([place]) 05/05/2019  . Small bowel obstruction [facility])     Past Surgical History:  Procedure Laterality Date  . APPENDECTOMY  2004  . APPLICATION OF WOUND VAC Left 02/11/2019   Procedure: Application Of Wound Vac;  Surgeon: Altamese Huttig, MD;  Location: [place];   Service: Orthopedics;  Laterality: Left;  . CARDIOVASCULAR STRESS TEST  09/30/2007   EF 67%  No ischemia.   Marland Kitchen CARDIOVERSION  05/01/2004  . KNEE ARTHROSCOPY Left 02/11/2019   Procedure: ARTHROSCOPY LEFT KNEE;  Surgeon: Altamese Hemlock, MD;  Location: [place];  Service: Orthopedics;  Laterality: Left;  . LAPAROTOMY N/A 05/11/2019   Procedure: EXPLORATORY LAPAROTOMY;  Surgeon: Rolm Bookbinder, MD;  Location: [place];  Service: General;  Laterality: N/A;  . LYSIS OF ADHESION N/A 05/11/2019   Procedure: LYSIS OF ADHESION;  Surgeon: Rolm Bookbinder, MD;  Location: [place];  Service: General;  Laterality: N/A;  . PATELLAR TENDON REPAIR Left 02/11/2019   Procedure: LEFT PATELLA TENDON REPAIR;  Surgeon: Altamese Opp, MD;  Location: [place];  Service: Orthopedics;  Laterality: Left;  . SMALL INTESTINE SURGERY  2004  . US ECHOCARDIOGRAPHY  09/05/2009   ef 55-60%    There were no vitals filed for this visit.  Subjective Assessment - 01/28/20 1410    Subjective  No new complaints. Doing well.    Patient is accompained by:  Family member   spouse, [name]   Pertinent History  PMH: recent hospitilization 05/22/19 atrial fibrillation, dementia, essential hypertension, HLD, diastolic congestive heart failure, aortic valve regurgitation, BPH, and recent small bowel obstruction with recent surgical intervention with lysis of adhesions    Limitations  Walking;Standing    How long can you stand comfortably?  can not stand on his own    How long can you walk comfortably?  can not walk on his own    Patient Stated Goals  to walk again    Currently in Pain?  No/denies                       [facility] Adult PT Treatment/Exercise - 01/28/20 0001      Transfers   Transfers  Sit to Stand;Stand to Sit    Sit to Stand  4: Min guard;With upper extremity assist;From elevated surface    Sit to Stand Details  Verbal cues for technique;Tactile cues for placement;Visual cues/gestures for precautions/safety;Visual  cues/gestures for sequencing    Sit to Stand Details (indicate cue type and reason)  cues to scoot towards edge of mat, cues for BUE placement (one hand on RW and one hand on mat table) 2 x 10 reps for blocked practice, pt with improved stability, pt performs with BUE support on RW and RW with tendency to lift back onto posterior wheels     Stand to Sit  4: Min guard;With upper extremity assist;To chair/3-in-1;To bed    Stand to Sit Details (indicate cue type and reason)  Verbal cues for technique      Ambulation/Gait   Ambulation/Gait  Yes    Ambulation/Gait Assistance  4: Min guard;4: Min assist    Ambulation/Gait Assistance Details  verbal/demonstrative cues to stay close to RW during gait, especially outdoors up inclines and over thresholds, while ambulating outdoors on pavement had pt scan environment right/left with no LOB. w/c follow - however pt did not need it outside during 500' bout of gait     Ambulation Distance (Feet)  500 Feet   outdoors, plus 115' indoors    Assistive device  Rolling walker    Gait Pattern  Step-through pattern;Narrow base of support;Trunk flexed    Ambulation Surface  Level;Indoor    Stairs  Yes    Stairs Assistance  4: Min guard    Stairs Assistance Details (indicate cue type and reason)  stand by assist of PT tech - use of RW at bottom of steps to descend final step and then turn/pivot with RW to sit into chair, PT tech standing  at bottom of staircase when pt descending stairs with visual cues for pt to bring hands to PT tech's hand when descending to advance hands with pt responding well to visual cue (PT posteriorly providing min guard), during last rep pt better able to advance hands along rails with just verbal cues    Stair Management Technique  Two rails;Step to pattern;Forwards    Number of Stairs  12    Height of Stairs  6      Knee/Hip Exercises: Aerobic   Other Aerobic  SciFit with BLE and BUEs at level 7.0 for 10 minutes for strengthening,  endurance, ROM, and activity tolerance.                PT Short Term Goals - 01/25/20 1410      PT SHORT TERM GOAL #1   Title  Pt will be able to ambulate at least 300' with RW with supervision in order to improve mobility. ALL STGS DUE 01/19/20    Baseline  300' indoors with supervision, min guard over outdoor surfaces    Status  Achieved      PT SHORT TERM GOAL #2  Title  Pt will be able to perform sit to stand with supersion with RW from w/c and mat table.    Baseline  01/25/20: met in session today    Status  Achieved      PT SHORT TERM GOAL #3   Title  Pt will be able to perform 4 steps with step to pattern and B handrails with min guard.    Baseline  01/25/20: met in session today    Status  Achieved        PT Long Term Goals - 12/22/19 1547      PT LONG TERM GOAL #1   Title  Pt will be I and compliant with final HEP. (target for all LTG 12 weeks 02/16/20)    Time  8    Period  Weeks    Status  New    Target Date  02/16/20      PT LONG TERM GOAL #2   Title  Pt will be able to perform sit to stand mod I.    Baseline  min guard/ one episode of min A from RW.    Time  8    Period  Weeks    Status  On-going      PT LONG TERM GOAL #3   Title  Pt will be able perform stand pivot transfers with RW with supervision to decrease caregiver burden.    Baseline  not formally assessed today.    Time  8    Period  Weeks    Status  New      PT LONG TERM GOAL #4   Title  Pt will be able to ambulate 300' over level indoor and unlevel outdoor paved surfaces with supervision/min guard in order to improve functional mobility.    Time  8    Period  Weeks    Status  New      PT LONG TERM GOAL #5   Title  Pt will improve gait speed to at least 2.6 ft/sec to demo improved functional mobility.    Baseline  2.11 ft/sec on 12/22/19    Time  8    Period  Weeks    Status  New      PT LONG TERM GOAL #6   Title  Pt will perform 12 steps with step to pattern and BUE assist on  handrails with min guard in order to perform stairs to reach the 2nd floor of his house.    Baseline  --    Time  8    Period  Weeks    Status  New            Plan - 01/28/20 1451    Clinical Impression Statement  Focus of today's skilled session was gait training outdoors on compliant surfaces with RW, stair training, and activity tolerance/LE strengthening with sit <> stands. Pt able to scan environment with head turns on paved surfaces with no LOB today. Pt did well with stairs today with visual cue from PT tech on where to place hands when descending stairs, did well on last rep of stairs without visual cue and just verbal cues instead. Pt able to ambulate 500' outdoors today with no seated rest breaks. Will continue to progress towards LTGs.    Personal Factors and Comorbidities  Age;Comorbidity 1;Comorbidity 2;Comorbidity 3+;Past/Current Experience;Fitness    Comorbidities  PMH: recent hospitilization 05/22/19 atrial fibrillation, dementia, essential hypertension, HLD, diastolic congestive heart failure, aortic valve regurgitation, BPH, and recent  small bowel obstruction with recent surgical intervention with lysis of adhesions    Examination-Activity Limitations  --   all ADL's   Examination-Participation Restrictions  --   All   Stability/Clinical Decision Making  Evolving/Moderate complexity    Rehab Potential  Fair    PT Frequency  Other (comment)   2-3   PT Duration  12 weeks    PT Treatment/Interventions  ADLs/Self Care Home Management;Aquatic Therapy;Cryotherapy;Moist Heat;DME Instruction;Gait training;Functional mobility training;Therapeutic activities;Therapeutic exercise;Balance training;Neuromuscular re-education;Manual techniques;Wheelchair mobility training;Patient/family education;Passive range of motion;Taping    PT Next Visit Plan  continue with strengthening on Scifit, gait with RW -outdoors/curb training and around obstacles, continue to work on stairs with 2 person  assist (second person for safety)    PT [place], sitting marches, sitting hamstring curls, SLR, standing at kitchen sink- UE reaching, marching in place, mini squats    Consulted and Agree with Plan of Care  Patient;Family member/caregiver    Family Member Consulted  wife       Patient will benefit from skilled therapeutic intervention in order to improve the following deficits and impairments:  Decreased balance, Abnormal gait, Decreased endurance, Decreased range of motion, Decreased strength, Decreased mobility, Difficulty walking, Postural dysfunction, Pain  Visit Diagnosis: Muscle weakness (generalized)  Other abnormalities of gait and mobility     Problem List Patient Active Problem List   Diagnosis Date Noted  . Bacteremia due to Enterococcus 05/24/2019  . Urinary tract infection 05/24/2019  . Chronic diastolic CHF (congestive heart failure) ([place]) 05/22/2019  . Gout 05/22/2019  . Sepsis ([place]) 05/22/2019  . Protein-calorie malnutrition, severe 05/18/2019  . AKI (acute kidney injury) ([place]) 05/05/2019  . Transient hypotension 05/05/2019  . Sepsis, Gram negative ([place]) 03/18/2019  . Hypoalbuminemia   . Leukocytosis   . Hyperglycemia   . Persistent atrial fibrillation with RVR ([place])   . Acute on chronic anemia   . Rupture of left patellar tendon 02/15/2019  . Postoperative pain   . Bipolar 1 disorder ([place]) 02/14/2019  . Dementia without behavioral disturbance ([place]) 02/14/2019  . Patellar tendon rupture, left, initial encounter 02/10/2019  . SIRS (systemic inflammatory response syndrome) ([place]) 02/09/2019  . Osteomyelitis ([place]) 02/09/2019  . Acute blood loss anemia 02/09/2019  . Aortic valve regurgitation 10/16/2018  . SBO (small bowel obstruction) ([place]) 07/08/2013  . Hyperlipidemia 12/20/2011  . HTN (hypertension) 06/04/2011  . Atrial fibrillation, chronic ([place]) 03/01/2011  . Long term (current) use of anticoagulants 03/01/2011  . GERD 10/11/2009  .  DIVERTICULOSIS OF COLON 10/11/2009    Arliss Journey, PT, DPT  01/28/2020, 2:55 PM  [facility] [address] [place], [place], [address] Phone: [phone]   Fax:  [phone]  Name: Russell Dillon MRN: [id number] Date of Birth: January 24, 1946

## 2020-02-01 ENCOUNTER — Ambulatory Visit: Payer: Medicare Other | Admitting: Physical Therapy

## 2020-02-01 ENCOUNTER — Other Ambulatory Visit: Payer: Self-pay

## 2020-02-01 ENCOUNTER — Encounter: Payer: Self-pay | Admitting: Physical Therapy

## 2020-02-01 DIAGNOSIS — M25562 Pain in left knee: Secondary | ICD-10-CM | POA: Diagnosis not present

## 2020-02-01 DIAGNOSIS — G8929 Other chronic pain: Secondary | ICD-10-CM | POA: Diagnosis not present

## 2020-02-01 DIAGNOSIS — M6281 Muscle weakness (generalized): Secondary | ICD-10-CM

## 2020-02-01 DIAGNOSIS — R2689 Other abnormalities of gait and mobility: Secondary | ICD-10-CM | POA: Diagnosis not present

## 2020-02-01 NOTE — Therapy (Signed)
[facility] [address] [place], [place], [address] Phone: [phone]   Fax:  [phone]  Physical Therapy Treatment  Patient Details  Name: Russell Dillon MRN: [id number] Date of Birth: 07-26-1946 Referring Provider (PT): Marton Redwood, MD   Encounter Date: 02/01/2020  PT End of Session - 02/01/20 1413    Visit Number  62    Number of Visits  31   recert for 16 more visits due to progress   Date for PT Re-Evaluation  03/21/20   written for 60 day POC   Authorization Type  MCR, AARP, 10th visit progress notes    Progress Note Due on Visit  70    PT Start Time  1402    PT Stop Time  1442    PT Time Calculation (min)  40 min    Equipment Utilized During Treatment  Gait belt    Activity Tolerance  Patient tolerated treatment well;No increased pain    Behavior During Therapy  WFL for tasks assessed/performed       Past Medical History:  Diagnosis Date  . Acute metabolic encephalopathy 0/76/2263  . Aortic valve regurgitation 10/16/2018   Echo 07/28/2017:  EF 60-65, moderate AI, aortic root and ascending aorta mildly dilated (40 mm), ascending aorta 43 mm, MAC, mild MR, mild LAE, moderate RV enlargement, trivial TR // Echo 12/19: EF 60-65, normal wall motion, mild AI, moderate BAE, ascending aorta 43 mm, aortic root 39 mm  . BPH (benign prostatic hyperplasia)   . BPH (benign prostatic hypertrophy)   . Chronic anticoagulation   . Chronic atrial fibrillation ([place])   . Diastolic dysfunction October 2010   Normal LV systolic function  . Hx SBO   . Hyperlipidemia   . Hypertension   . Patellar tendon rupture, left, initial encounter 02/10/2019  . Severe sepsis ([place]) 05/05/2019  . Small bowel obstruction [facility])     Past Surgical History:  Procedure Laterality Date  . APPENDECTOMY  2004  . APPLICATION OF WOUND VAC Left 02/11/2019   Procedure: Application Of Wound Vac;  Surgeon: Altamese Minburn, MD;  Location: [place];   Service: Orthopedics;  Laterality: Left;  . CARDIOVASCULAR STRESS TEST  09/30/2007   EF 67%  No ischemia.   Marland Kitchen CARDIOVERSION  05/01/2004  . KNEE ARTHROSCOPY Left 02/11/2019   Procedure: ARTHROSCOPY LEFT KNEE;  Surgeon: Altamese [place], MD;  Location: [place];  Service: Orthopedics;  Laterality: Left;  . LAPAROTOMY N/A 05/11/2019   Procedure: EXPLORATORY LAPAROTOMY;  Surgeon: Rolm Bookbinder, MD;  Location: [place];  Service: General;  Laterality: N/A;  . LYSIS OF ADHESION N/A 05/11/2019   Procedure: LYSIS OF ADHESION;  Surgeon: Rolm Bookbinder, MD;  Location: [place];  Service: General;  Laterality: N/A;  . PATELLAR TENDON REPAIR Left 02/11/2019   Procedure: LEFT PATELLA TENDON REPAIR;  Surgeon: Altamese Bud, MD;  Location: [place];  Service: Orthopedics;  Laterality: Left;  . SMALL INTESTINE SURGERY  2004  . US ECHOCARDIOGRAPHY  09/05/2009   ef 55-60%    There were no vitals filed for this visit.  Subjective Assessment - 02/01/20 1412    Subjective  No new complaints. Doing well.    Patient is accompained by:  Family member   spouse [name]   Pertinent History  PMH: recent hospitilization 05/22/19 atrial fibrillation, dementia, essential hypertension, HLD, diastolic congestive heart failure, aortic valve regurgitation, BPH, and recent small bowel obstruction with recent surgical intervention with lysis of adhesions    Limitations  Walking;Standing    How long can you stand comfortably?  can not stand on his own    How long can you walk comfortably?  can not walk on his own    Patient Stated Goals  to walk again    Pain Score  0-No pain           [facility] Adult PT Treatment/Exercise - 02/01/20 1413      Transfers   Transfers  Sit to Stand;Stand to Sit    Sit to Stand  4: Min guard;With upper extremity assist;From elevated surface    Stand to Sit  4: Min guard;With upper extremity assist;To chair/3-in-1;To bed      Ambulation/Gait   Ambulation/Gait  Yes    Ambulation/Gait Assistance  4:  Min guard    Ambulation/Gait Assistance Details  gait around gym that included right/left turns, narrow spaces and obstacle negotiation with moderate cues to stay closer to RW needed. Pt with "bursts" of speed at times on straightways with no balance loss noted.     Ambulation Distance (Feet)  450 Feet   x1,   Assistive device  Rolling walker    Gait Pattern  Step-through pattern;Narrow base of support;Trunk flexed    Ambulation Surface  Level;Indoor      Exercises   Exercises  Other Exercises    Other Exercises   seated at edge of mat: with green theraband: bil sides rows for 10 reps, then attemped bil shoulder extension. despite max cues pt not keeping elbow straight, doing rows instead. switched to single UE shoulder extension with PTA holding band at top, pt pulling down to mat for 10 reps each side.  with 3# ankle weights- long arc quads, marching for 10 reps each side AAROM;  with green band hamstring curls with assist for controlled movements for 10 reps.       Knee/Hip Exercises: Aerobic   Other Aerobic  SciFit with BLE and BUEs at level 7.0 for 10 minutes for strengthening, endurance, ROM, and activity tolerance.                PT Short Term Goals - 01/25/20 1410      PT SHORT TERM GOAL #1   Title  Pt will be able to ambulate at least 300' with RW with supervision in order to improve mobility. ALL STGS DUE 01/19/20    Baseline  300' indoors with supervision, min guard over outdoor surfaces    Status  Achieved      PT SHORT TERM GOAL #2   Title  Pt will be able to perform sit to stand with supersion with RW from w/c and mat table.    Baseline  01/25/20: met in session today    Status  Achieved      PT SHORT TERM GOAL #3   Title  Pt will be able to perform 4 steps with step to pattern and B handrails with min guard.    Baseline  01/25/20: met in session today    Status  Achieved        PT Long Term Goals - 12/22/19 1547      PT LONG TERM GOAL #1   Title  Pt will be  I and compliant with final HEP. (target for all LTG 12 weeks 02/16/20)    Time  8    Period  Weeks    Status  New    Target Date  02/16/20      PT LONG TERM GOAL #  2   Title  Pt will be able to perform sit to stand mod I.    Baseline  min guard/ one episode of min A from RW.    Time  8    Period  Weeks    Status  On-going      PT LONG TERM GOAL #3   Title  Pt will be able perform stand pivot transfers with RW with supervision to decrease caregiver burden.    Baseline  not formally assessed today.    Time  8    Period  Weeks    Status  New      PT LONG TERM GOAL #4   Title  Pt will be able to ambulate 300' over level indoor and unlevel outdoor paved surfaces with supervision/min guard in order to improve functional mobility.    Time  8    Period  Weeks    Status  New      PT LONG TERM GOAL #5   Title  Pt will improve gait speed to at least 2.6 ft/sec to demo improved functional mobility.    Baseline  2.11 ft/sec on 12/22/19    Time  8    Period  Weeks    Status  New      PT LONG TERM GOAL #6   Title  Pt will perform 12 steps with step to pattern and BUE assist on handrails with min guard in order to perform stairs to reach the 2nd floor of his house.    Baseline  --    Time  8    Period  Weeks    Status  New            Plan - 02/01/20 1413    Clinical Impression Statement  Today's skilled session continued to focus on strengthening and gait with RW with emphasis on turns/tight spaces/obstacle negotiation with continued need of cues to stay closer to RW with gait. No issues reported with session. Pt is progressing toward goals and should benefit from continued PT to progress toward unmet goals.    Personal Factors and Comorbidities  Age;Comorbidity 1;Comorbidity 2;Comorbidity 3+;Past/Current Experience;Fitness    Comorbidities  PMH: recent hospitilization 05/22/19 atrial fibrillation, dementia, essential hypertension, HLD, diastolic congestive heart failure, aortic valve  regurgitation, BPH, and recent small bowel obstruction with recent surgical intervention with lysis of adhesions    Examination-Activity Limitations  --   all ADL's   Examination-Participation Restrictions  --   All   Stability/Clinical Decision Making  Evolving/Moderate complexity    Rehab Potential  Fair    PT Frequency  Other (comment)   2-3   PT Duration  12 weeks    PT Treatment/Interventions  ADLs/Self Care Home Management;Aquatic Therapy;Cryotherapy;Moist Heat;DME Instruction;Gait training;Functional mobility training;Therapeutic activities;Therapeutic exercise;Balance training;Neuromuscular re-education;Manual techniques;Wheelchair mobility training;Patient/family education;Passive range of motion;Taping    PT Next Visit Plan 10th visit progress note due next session; continue with strengthening on Scifit, gait with RW -outdoors/curb training and around obstacles, continue to work on stairs with 2 person assist (second person for safety)    PT Home Exercise Plan  [place], sitting marches, sitting hamstring curls, SLR, standing at kitchen sink- UE reaching, marching in place, mini squats    Consulted and Agree with Plan of Care  Patient;Family member/caregiver    Family Member Consulted  wife       Patient will benefit from skilled therapeutic intervention in order to improve the following deficits and impairments:  Decreased balance,  Abnormal gait, Decreased endurance, Decreased range of motion, Decreased strength, Decreased mobility, Difficulty walking, Postural dysfunction, Pain  Visit Diagnosis: Muscle weakness (generalized)  Other abnormalities of gait and mobility     Problem List Patient Active Problem List   Diagnosis Date Noted  . Bacteremia due to Enterococcus 05/24/2019  . Urinary tract infection 05/24/2019  . Chronic diastolic CHF (congestive heart failure) ([place]) 05/22/2019  . Gout 05/22/2019  . Sepsis ([place]) 05/22/2019  . Protein-calorie malnutrition, severe  05/18/2019  . AKI (acute kidney injury) ([place]) 05/05/2019  . Transient hypotension 05/05/2019  . Sepsis, Gram negative ([place]) 03/18/2019  . Hypoalbuminemia   . Leukocytosis   . Hyperglycemia   . Persistent atrial fibrillation with RVR ([place])   . Acute on chronic anemia   . Rupture of left patellar tendon 02/15/2019  . Postoperative pain   . Bipolar 1 disorder ([place]) 02/14/2019  . Dementia without behavioral disturbance ([place]) 02/14/2019  . Patellar tendon rupture, left, initial encounter 02/10/2019  . SIRS (systemic inflammatory response syndrome) ([place]) 02/09/2019  . Osteomyelitis ([place]) 02/09/2019  . Acute blood loss anemia 02/09/2019  . Aortic valve regurgitation 10/16/2018  . SBO (small bowel obstruction) ([place]) 07/08/2013  . Hyperlipidemia 12/20/2011  . HTN (hypertension) 06/04/2011  . Atrial fibrillation, chronic ([place]) 03/01/2011  . Long term (current) use of anticoagulants 03/01/2011  . GERD 10/11/2009  . DIVERTICULOSIS OF COLON 10/11/2009    Willow Ora, PTA, [facility] [address] [place], [place] [address] [phone] 02/02/20, 8:59 AM   Name: CHARLE CLEAR MRN: [id number] Date of Birth: 06/25/1946

## 2020-02-04 ENCOUNTER — Other Ambulatory Visit: Payer: Self-pay

## 2020-02-04 ENCOUNTER — Ambulatory Visit: Payer: Medicare Other | Admitting: Physical Therapy

## 2020-02-04 DIAGNOSIS — R2689 Other abnormalities of gait and mobility: Secondary | ICD-10-CM | POA: Diagnosis not present

## 2020-02-04 DIAGNOSIS — M6281 Muscle weakness (generalized): Secondary | ICD-10-CM

## 2020-02-04 DIAGNOSIS — I1310 Hypertensive heart and chronic kidney disease without heart failure, with stage 1 through stage 4 chronic kidney disease, or unspecified chronic kidney disease: Secondary | ICD-10-CM | POA: Diagnosis not present

## 2020-02-04 DIAGNOSIS — M25562 Pain in left knee: Secondary | ICD-10-CM | POA: Diagnosis not present

## 2020-02-04 DIAGNOSIS — G8929 Other chronic pain: Secondary | ICD-10-CM | POA: Diagnosis not present

## 2020-02-04 NOTE — Therapy (Signed)
[facility] [address] [place], [place], [address] Phone: [phone]   Fax:  [phone]  Physical Therapy Treatment  Patient Details  Name: Russell Dillon MRN: [id number] Date of Birth: Jun 06, 1946 Referring Provider (PT): Marton Redwood, MD  10th Visit Physical Therapy Progress Note  Dates of Reporting Period: 01/04/20 to 02/04/20    Encounter Date: 02/04/2020  PT End of Session - 02/04/20 1556    Visit Number  50    Number of Visits  64   recert for 16 more visits due to progress   Date for PT Re-Evaluation  03/21/20   written for 60 day POC   Authorization Type  MCR, AARP, 10th visit progress notes    Progress Note Due on Visit  23    PT Start Time  1402    PT Stop Time  1444    PT Time Calculation (min)  42 min    Equipment Utilized During Treatment  Gait belt    Activity Tolerance  Patient tolerated treatment well;No increased pain    Behavior During Therapy  WFL for tasks assessed/performed       Past Medical History:  Diagnosis Date  . Acute metabolic encephalopathy 2/64/1583  . Aortic valve regurgitation 10/16/2018   Echo 07/28/2017:  EF 60-65, moderate AI, aortic root and ascending aorta mildly dilated (40 mm), ascending aorta 43 mm, MAC, mild MR, mild LAE, moderate RV enlargement, trivial TR // Echo 12/19: EF 60-65, normal wall motion, mild AI, moderate BAE, ascending aorta 43 mm, aortic root 39 mm  . BPH (benign prostatic hyperplasia)   . BPH (benign prostatic hypertrophy)   . Chronic anticoagulation   . Chronic atrial fibrillation ([place])   . Diastolic dysfunction October 2010   Normal LV systolic function  . Hx SBO   . Hyperlipidemia   . Hypertension   . Patellar tendon rupture, left, initial encounter 02/10/2019  . Severe sepsis ([place]) 05/05/2019  . Small bowel obstruction [facility])     Past Surgical History:  Procedure Laterality Date  . APPENDECTOMY  2004  . APPLICATION OF WOUND VAC Left  02/11/2019   Procedure: Application Of Wound Vac;  Surgeon: Altamese Lake Delton, MD;  Location: [facility];  Service: Orthopedics;  Laterality: Left;  . CARDIOVASCULAR STRESS TEST  09/30/2007   EF 67%  No ischemia.   Marland Kitchen CARDIOVERSION  05/01/2004  . KNEE ARTHROSCOPY Left 02/11/2019   Procedure: ARTHROSCOPY LEFT KNEE;  Surgeon: Altamese Brockport, MD;  Location: [facility];  Service: Orthopedics;  Laterality: Left;  . LAPAROTOMY N/A 05/11/2019   Procedure: EXPLORATORY LAPAROTOMY;  Surgeon: Rolm Bookbinder, MD;  Location: [place];  Service: General;  Laterality: N/A;  . LYSIS OF ADHESION N/A 05/11/2019   Procedure: LYSIS OF ADHESION;  Surgeon: Rolm Bookbinder, MD;  Location: [place] [facility];  Service: General;  Laterality: N/A;  . PATELLAR TENDON REPAIR Left 02/11/2019   Procedure: LEFT PATELLA TENDON REPAIR;  Surgeon: Altamese Merrydale, MD;  Location: [facility];  Service: Orthopedics;  Laterality: Left;  . SMALL INTESTINE SURGERY  2004  . US ECHOCARDIOGRAPHY  09/05/2009   ef 55-60%    There were no vitals filed for this visit.  Subjective Assessment - 02/04/20 1405    Subjective  Getting the stair chair on Monday to go up and down stairs.    Patient is accompained by:  Family member   spouse [name]   Pertinent History  PMH: recent hospitilization 05/22/19 atrial fibrillation, dementia, essential hypertension, HLD, diastolic congestive  heart failure, aortic valve regurgitation, BPH, and recent small bowel obstruction with recent surgical intervention with lysis of adhesions    Limitations  Walking;Standing    How long can you stand comfortably?  can not stand on his own    How long can you walk comfortably?  can not walk on his own    Patient Stated Goals  to walk again    Currently in Pain?  No/denies                       [facility] Adult PT Treatment/Exercise - 02/04/20 0001      Transfers   Transfers  Sit to Stand;Stand to Sit    Sit to Stand  4: Min guard;With upper extremity assist;From elevated surface     Sit to Stand Details  Verbal cues for technique;Tactile cues for placement;Visual cues/gestures for precautions/safety;Visual cues/gestures for sequencing    Sit to Stand Details (indicate cue type and reason)  cues for proper hand placement prior to standing    Stand to Sit  4: Min guard;With upper extremity assist;To chair/3-in-1;To bed      Ambulation/Gait   Ambulation/Gait  Yes    Ambulation/Gait Assistance  4: Min guard    Ambulation/Gait Assistance Details  had pt perform 2 laps of gait while scanning environment looking for cones of different colors with no LOB, had pt weave in and out of 4 cones 4 x 40' with cues for turns and obstacle negotiation and min guard to stay close to RW. pt ambulated with supervision between activities for remainder of session    Ambulation Distance (Feet)  230 Feet   x1, plus additional clinic distances   Assistive device  Rolling walker    Gait Pattern  Step-through pattern;Narrow base of support;Trunk flexed    Ambulation Surface  Level;Indoor    Stairs  Yes    Stairs Assistance  4: Min guard    Stairs Assistance Details (indicate cue type and reason)  stand by assist of PT tech at bottom of steps - with tech giving visual cues of where pt should advance hands along railing as R hand still has more tendency to get left behind. RW placed at bottom of step for pt to descend, chair placed for pt to perform pivot transfer to sit and rest after each rep    Stair Management Technique  Two rails;Step to pattern;Forwards    Number of Stairs  12   4 reps of 4 steps   Height of Stairs  6      Self-Care   Self-Care  Other Self-Care Comments    Other Self-Care Comments   Began to discuss with pt's wife (while pt on SciFit) about upcoming discharge in a couple of weeks. Pt's wife asking if pt should start begin using a cane. Discussed with wife that due to safety and pt's balance that a RW would be the safest for pt to use to decr fall risk, wife verbalized  understanding. Also discussed about looking into pt joining a gym in order to continue aerobic activity with use of a SciFit/NuStep - wife, [name], states she will look into nearby gyms.       Knee/Hip Exercises: Aerobic   Other Aerobic  SciFit with BLE and BUEs at level 7.0 for 10 minutes for strengthening, endurance, ROM, and activity tolerance.              PT Education - 02/04/20 1555    Education  Details  see self-care    Person(s) Educated  Spouse    Methods  Explanation    Comprehension  Verbalized understanding       PT Short Term Goals - 01/25/20 1410      PT SHORT TERM GOAL #1   Title  Pt will be able to ambulate at least 300' with RW with supervision in order to improve mobility. ALL STGS DUE 01/19/20    Baseline  300' indoors with supervision, min guard over outdoor surfaces    Status  Achieved      PT SHORT TERM GOAL #2   Title  Pt will be able to perform sit to stand with supersion with RW from w/c and mat table.    Baseline  01/25/20: met in session today    Status  Achieved      PT SHORT TERM GOAL #3   Title  Pt will be able to perform 4 steps with step to pattern and B handrails with min guard.    Baseline  01/25/20: met in session today    Status  Achieved        PT Long Term Goals - 12/22/19 1547      PT LONG TERM GOAL #1   Title  Pt will be I and compliant with final HEP. (target for all LTG 12 weeks 02/16/20)    Time  8    Period  Weeks    Status  New    Target Date  02/16/20      PT LONG TERM GOAL #2   Title  Pt will be able to perform sit to stand mod I.    Baseline  min guard/ one episode of min A from RW.    Time  8    Period  Weeks    Status  On-going      PT LONG TERM GOAL #3   Title  Pt will be able perform stand pivot transfers with RW with supervision to decrease caregiver burden.    Baseline  not formally assessed today.    Time  8    Period  Weeks    Status  New      PT LONG TERM GOAL #4   Title  Pt will be able to ambulate  300' over level indoor and unlevel outdoor paved surfaces with supervision/min guard in order to improve functional mobility.    Time  8    Period  Weeks    Status  New      PT LONG TERM GOAL #5   Title  Pt will improve gait speed to at least 2.6 ft/sec to demo improved functional mobility.    Baseline  2.11 ft/sec on 12/22/19    Time  8    Period  Weeks    Status  New      PT LONG TERM GOAL #6   Title  Pt will perform 12 steps with step to pattern and BUE assist on handrails with min guard in order to perform stairs to reach the 2nd floor of his house.    Baseline  --    Time  8    Period  Weeks    Status  New            Plan - 02/04/20 1604    Clinical Impression Statement  10th visit progress note: Focus of today's skilled session was activity tolerance, stair training, and gait training with scanning environment and obstacle negotiation. Pt needing  min guard for scanning environment and while weaving in and out of cones - still needs reminder cues to remain close to RW as pt tends to push too far anteriorly. Pt with supervision with RW ambulating in and out of clinic. Pt still requires min guard when descending stairs due to pt having difficulty advancing his hands down railing. However, pt does well with visual cue reminder from PT tech on having pt "move his hands to his hands". Pt is making great gains with PT, will continue to progress towards LTGs.    Personal Factors and Comorbidities  Age;Comorbidity 1;Comorbidity 2;Comorbidity 3+;Past/Current Experience;Fitness    Comorbidities  PMH: recent hospitilization 05/22/19 atrial fibrillation, dementia, essential hypertension, HLD, diastolic congestive heart failure, aortic valve regurgitation, BPH, and recent small bowel obstruction with recent surgical intervention with lysis of adhesions    Examination-Activity Limitations  --   all ADL's   Examination-Participation Restrictions  --   All   Stability/Clinical Decision Making   Evolving/Moderate complexity    Rehab Potential  Fair    PT Frequency  Other (comment)   2-3   PT Duration  12 weeks    PT Treatment/Interventions  ADLs/Self Care Home Management;Aquatic Therapy;Cryotherapy;Moist Heat;DME Instruction;Gait training;Functional mobility training;Therapeutic activities;Therapeutic exercise;Balance training;Neuromuscular re-education;Manual techniques;Wheelchair mobility training;Patient/family education;Passive range of motion;Taping    PT Next Visit Plan  continue with strengthening on Scifit, gait with RW -outdoors/curb training and around obstacles, continue to work on stairs with 2 person assist (second person for safety)    PT [place], sitting marches, sitting hamstring curls, SLR, standing at kitchen sink- UE reaching, marching in place, mini squats    Consulted and Agree with Plan of Care  Patient;Family member/caregiver    Family Member Consulted  wife       Patient will benefit from skilled therapeutic intervention in order to improve the following deficits and impairments:  Decreased balance, Abnormal gait, Decreased endurance, Decreased range of motion, Decreased strength, Decreased mobility, Difficulty walking, Postural dysfunction, Pain  Visit Diagnosis: Other abnormalities of gait and mobility  Muscle weakness (generalized)     Problem List Patient Active Problem List   Diagnosis Date Noted  . Bacteremia due to Enterococcus 05/24/2019  . Urinary tract infection 05/24/2019  . Chronic diastolic CHF (congestive heart failure) ([place]) 05/22/2019  . Gout 05/22/2019  . Sepsis ([place]) 05/22/2019  . Protein-calorie malnutrition, severe 05/18/2019  . AKI (acute kidney injury) ([place]) 05/05/2019  . Transient hypotension 05/05/2019  . Sepsis, Gram negative () 03/18/2019  . Hypoalbuminemia   . Leukocytosis   . Hyperglycemia   . Persistent atrial fibrillation with RVR ([place])   . Acute on chronic anemia   . Rupture of left patellar  tendon 02/15/2019  . Postoperative pain   . Bipolar 1 disorder ([place]) 02/14/2019  . Dementia without behavioral disturbance ([place]) 02/14/2019  . Patellar tendon rupture, left, initial encounter 02/10/2019  . SIRS (systemic inflammatory response syndrome) ([place]) 02/09/2019  . Osteomyelitis ([place]) 02/09/2019  . Acute blood loss anemia 02/09/2019  . Aortic valve regurgitation 10/16/2018  . SBO (small bowel obstruction) ([place]) 07/08/2013  . Hyperlipidemia 12/20/2011  . HTN (hypertension) 06/04/2011  . Atrial fibrillation, chronic ([place]) 03/01/2011  . Long term (current) use of anticoagulants 03/01/2011  . GERD 10/11/2009  . DIVERTICULOSIS OF COLON 10/11/2009    Arliss Journey, PT, DPT  02/04/2020, 4:05 PM  [facility] [address] [place], [place], [address] Phone: [phone]   Fax:  [phone]  Name: Russell Dillon MRN: [id number] Date of Birth: 1946/08/14

## 2020-02-08 ENCOUNTER — Other Ambulatory Visit: Payer: Self-pay

## 2020-02-08 ENCOUNTER — Ambulatory Visit: Payer: Medicare Other | Admitting: Physical Therapy

## 2020-02-08 DIAGNOSIS — M6281 Muscle weakness (generalized): Secondary | ICD-10-CM | POA: Diagnosis not present

## 2020-02-08 DIAGNOSIS — R2689 Other abnormalities of gait and mobility: Secondary | ICD-10-CM | POA: Diagnosis not present

## 2020-02-08 DIAGNOSIS — M25562 Pain in left knee: Secondary | ICD-10-CM | POA: Diagnosis not present

## 2020-02-08 DIAGNOSIS — G8929 Other chronic pain: Secondary | ICD-10-CM | POA: Diagnosis not present

## 2020-02-08 NOTE — Therapy (Signed)
[facility] [address] [place], [place], [address] Phone: [phone]   Fax:  [phone]  Physical Therapy Treatment  Patient Details  Name: Russell Dillon MRN: [id number] Date of Birth: [birth year] Referring Provider (PT): Marton Redwood, MD   Encounter Date: 02/08/2020  PT End of Session - 02/08/20 2055    Visit Number  51    Number of Visits  55   recert for 16 more visits due to progress   Date for PT Re-Evaluation  03/21/20   written for 60 day POC   Authorization Type  MCR, AARP, 10th visit progress notes    Progress Note Due on Visit  35    PT Start Time  1448    PT Stop Time  1528    PT Time Calculation (min)  40 min    Equipment Utilized During Treatment  Gait belt    Activity Tolerance  Patient tolerated treatment well;No increased pain    Behavior During Therapy  WFL for tasks assessed/performed       Past Medical History:  Diagnosis Date  . Acute metabolic encephalopathy 5/36/4680  . Aortic valve regurgitation 10/16/2018   Echo 07/28/2017:  EF 60-65, moderate AI, aortic root and ascending aorta mildly dilated (40 mm), ascending aorta 43 mm, MAC, mild MR, mild LAE, moderate RV enlargement, trivial TR // Echo 12/19: EF 60-65, normal wall motion, mild AI, moderate BAE, ascending aorta 43 mm, aortic root 39 mm  . BPH (benign prostatic hyperplasia)   . BPH (benign prostatic hypertrophy)   . Chronic anticoagulation   . Chronic atrial fibrillation ([place])   . Diastolic dysfunction October 2010   Normal LV systolic function  . Hx SBO   . Hyperlipidemia   . Hypertension   . Patellar tendon rupture, left, initial encounter 02/10/2019  . Severe sepsis ([place]) 05/05/2019  . Small bowel obstruction [facility])     Past Surgical History:  Procedure Laterality Date  . APPENDECTOMY  2004  . APPLICATION OF WOUND VAC Left 02/11/2019   Procedure: Application Of Wound Vac;  Surgeon: Altamese Caldwell, MD;  Location: [facility];   Service: Orthopedics;  Laterality: Left;  . CARDIOVASCULAR STRESS TEST  09/30/2007   EF 67%  No ischemia.   Marland Kitchen CARDIOVERSION  05/01/2004  . KNEE ARTHROSCOPY Left 02/11/2019   Procedure: ARTHROSCOPY LEFT KNEE;  Surgeon: Altamese Violet, MD;  Location: [facility];  Service: Orthopedics;  Laterality: Left;  . LAPAROTOMY N/A 05/11/2019   Procedure: EXPLORATORY LAPAROTOMY;  Surgeon: Rolm Bookbinder, MD;  Location: [facility];  Service: General;  Laterality: N/A;  . LYSIS OF ADHESION N/A 05/11/2019   Procedure: LYSIS OF ADHESION;  Surgeon: Rolm Bookbinder, MD;  Location: [facility];  Service: General;  Laterality: N/A;  . PATELLAR TENDON REPAIR Left 02/11/2019   Procedure: LEFT PATELLA TENDON REPAIR;  Surgeon: Altamese [place], MD;  Location: [facility];  Service: Orthopedics;  Laterality: Left;  . SMALL INTESTINE SURGERY  2004  . US ECHOCARDIOGRAPHY  09/05/2009   ef 55-60%    There were no vitals filed for this visit.  Subjective Assessment - 02/08/20 1454    Subjective  Had his stair chair lift installed.    Patient is accompained by:  Family member   spouse [name]   Pertinent History  PMH: recent hospitilization 05/22/19 atrial fibrillation, dementia, essential hypertension, HLD, diastolic congestive heart failure, aortic valve regurgitation, BPH, and recent small bowel obstruction with recent surgical intervention with lysis of adhesions  Limitations  Walking;Standing    How long can you stand comfortably?  can not stand on his own    How long can you walk comfortably?  can not walk on his own    Patient Stated Goals  to walk again    Currently in Pain?  No/denies                       [facility] Adult PT Treatment/Exercise - 02/08/20 1517      Ambulation/Gait   Ambulation/Gait  Yes    Ambulation/Gait Assistance  5: Supervision;4: Min guard    Ambulation/Gait Assistance Details  ambulated outdoors (went to right outside of back doors vs. left) - pt continues to need verbal cues to keep walker on  the ground and close to body when ambulating over thresholds. no LOB while outside on unlevel paved surfaces. Pt passed his caregiver while ambulating outside and had an incr "burst" of ambulating with faster gait speed to try to catch up with no LOB    Ambulation Distance (Feet)  1017 Feet   between indoors and outdoors   Assistive device  Rolling walker    Gait Pattern  Step-through pattern;Narrow base of support;Trunk flexed    Ambulation Surface  Level;Indoor;Outdoor;Paved    Stairs  Yes    Stairs Assistance  4: Min guard    Stairs Assistance Details (indicate cue type and reason)  needing initial cues to fully place foot on step when ascending, PT tech providing standy by assist. pt only needing minimal verbal cues to advance hands when descending steps today. RW placed at bottom of step for easier descent and chair placed for pt to perform stand pivot transfer and bottom of steps to sit and rest, needs occassional verbal cues on which direction to turn     Stair Management Technique  Two rails;Step to pattern;Forwards    Number of Stairs  8    Height of Stairs  6      Knee/Hip Exercises: Aerobic   Other Aerobic  SciFit with BLE and BUEs at level 7.0 for 10 minutes for strengthening, endurance, ROM, and activity tolerance.                PT Short Term Goals - 01/25/20 1410      PT SHORT TERM GOAL #1   Title  Pt will be able to ambulate at least 300' with RW with supervision in order to improve mobility. ALL STGS DUE 01/19/20    Baseline  300' indoors with supervision, min guard over outdoor surfaces    Status  Achieved      PT SHORT TERM GOAL #2   Title  Pt will be able to perform sit to stand with supersion with RW from w/c and mat table.    Baseline  01/25/20: met in session today    Status  Achieved      PT SHORT TERM GOAL #3   Title  Pt will be able to perform 4 steps with step to pattern and B handrails with min guard.    Baseline  01/25/20: met in session today     Status  Achieved        PT Long Term Goals - 12/22/19 1547      PT LONG TERM GOAL #1   Title  Pt will be I and compliant with final HEP. (target for all LTG 12 weeks 02/16/20)    Time  8    Period    Weeks    Status  New    Target Date  02/16/20      PT LONG TERM GOAL #2   Title  Pt will be able to perform sit to stand mod I.    Baseline  min guard/ one episode of min A from RW.    Time  8    Period  Weeks    Status  On-going      PT LONG TERM GOAL #3   Title  Pt will be able perform stand pivot transfers with RW with supervision to decrease caregiver burden.    Baseline  not formally assessed today.    Time  8    Period  Weeks    Status  New      PT LONG TERM GOAL #4   Title  Pt will be able to ambulate 300' over level indoor and unlevel outdoor paved surfaces with supervision/min guard in order to improve functional mobility.    Time  8    Period  Weeks    Status  New      PT LONG TERM GOAL #5   Title  Pt will improve gait speed to at least 2.6 ft/sec to demo improved functional mobility.    Baseline  2.11 ft/sec on 12/22/19    Time  8    Period  Weeks    Status  New      PT LONG TERM GOAL #6   Title  Pt will perform 12 steps with step to pattern and BUE assist on handrails with min guard in order to perform stairs to reach the 2nd floor of his house.    Baseline  --    Time  8    Period  Weeks    Status  New            Plan - 02/08/20 2100    Clinical Impression Statement  Focus of today's skilled session was BLE strengthening, gait training, and stair training. Pt able to progress gait distance today to 1,017' with no seated rest breaks - majority of bout of gait outdoors on unlevel paved surfaces. Pt continues to need verbal cues to keep RW on ground and close to body when ambulating over thresholds. Pt with improvement today in descending stairs with only needing min verbal cues to advance hands. Will continue to progress towards LTGs.    Personal Factors and  Comorbidities  Age;Comorbidity 1;Comorbidity 2;Comorbidity 3+;Past/Current Experience;Fitness    Comorbidities  PMH: recent hospitilization 05/22/19 atrial fibrillation, dementia, essential hypertension, HLD, diastolic congestive heart failure, aortic valve regurgitation, BPH, and recent small bowel obstruction with recent surgical intervention with lysis of adhesions    Examination-Activity Limitations  --   all ADL's   Examination-Participation Restrictions  --   All   Stability/Clinical Decision Making  Evolving/Moderate complexity    Rehab Potential  Fair    PT Frequency  Other (comment)   2-3   PT Duration  12 weeks    PT Treatment/Interventions  ADLs/Self Care Home Management;Aquatic Therapy;Cryotherapy;Moist Heat;DME Instruction;Gait training;Functional mobility training;Therapeutic activities;Therapeutic exercise;Balance training;Neuromuscular re-education;Manual techniques;Wheelchair mobility training;Patient/family education;Passive range of motion;Taping    PT Next Visit Plan  continue with strengthening on Scifit, gait with RW -outdoors/curb training and around obstacles, continue to work on stairs - stand by assist of 2nd person. work on finalizing HEP for discharge coming up in a couple of sessions    PT Home Exercise Plan  LAQ, sitting marches, sitting hamstring curls, SLR,   standing at kitchen sink- UE reaching, marching in place, mini squats    Consulted and Agree with Plan of Care  Patient;Family member/caregiver    Family Member Consulted  wife       Patient will benefit from skilled therapeutic intervention in order to improve the following deficits and impairments:  Decreased balance, Abnormal gait, Decreased endurance, Decreased range of motion, Decreased strength, Decreased mobility, Difficulty walking, Postural dysfunction, Pain  Visit Diagnosis: Muscle weakness (generalized)  Other abnormalities of gait and mobility     Problem List Patient Active Problem List    Diagnosis Date Noted  . Bacteremia due to Enterococcus 05/24/2019  . Urinary tract infection 05/24/2019  . Chronic diastolic CHF (congestive heart failure) ([facility]) 05/22/2019  . Gout 05/22/2019  . Sepsis ([facility]) 05/22/2019  . Protein-calorie malnutrition, severe 05/18/2019  . AKI (acute kidney injury) ([facility]) 05/05/2019  . Transient hypotension 05/05/2019  . Sepsis, Gram negative ([facility]) 03/18/2019  . Hypoalbuminemia   . Leukocytosis   . Hyperglycemia   . Persistent atrial fibrillation with RVR ([facility])   . Acute on chronic anemia   . Rupture of left patellar tendon 02/15/2019  . Postoperative pain   . Bipolar 1 disorder ([facility]) 02/14/2019  . Dementia without behavioral disturbance ([facility]) 02/14/2019  . Patellar tendon rupture, left, initial encounter 02/10/2019  . SIRS (systemic inflammatory response syndrome) ([facility]) 02/09/2019  . Osteomyelitis ([facility]) 02/09/2019  . Acute blood loss anemia 02/09/2019  . Aortic valve regurgitation 10/16/2018  . SBO (small bowel obstruction) ([facility]) 07/08/2013  . Hyperlipidemia 12/20/2011  . HTN (hypertension) 06/04/2011  . Atrial fibrillation, chronic ([facility]) 03/01/2011  . Long term (current) use of anticoagulants 03/01/2011  . GERD 10/11/2009  . DIVERTICULOSIS OF COLON 10/11/2009     N , PT, DPT  02/08/2020, 9:04 PM  [facility] [address] [place], [place], [address] Phone: [phone]   Fax:  [phone]  Name: Miner L Zinni MRN: [id number] Date of Birth: 10/18/1946   

## 2020-02-10 ENCOUNTER — Other Ambulatory Visit: Payer: Self-pay

## 2020-02-10 ENCOUNTER — Ambulatory Visit: Payer: Medicare Other | Attending: Internal Medicine

## 2020-02-10 DIAGNOSIS — G8929 Other chronic pain: Secondary | ICD-10-CM | POA: Diagnosis not present

## 2020-02-10 DIAGNOSIS — M25562 Pain in left knee: Secondary | ICD-10-CM | POA: Diagnosis not present

## 2020-02-10 DIAGNOSIS — M6281 Muscle weakness (generalized): Secondary | ICD-10-CM | POA: Diagnosis not present

## 2020-02-10 DIAGNOSIS — R2689 Other abnormalities of gait and mobility: Secondary | ICD-10-CM | POA: Insufficient documentation

## 2020-02-10 NOTE — Therapy (Signed)
[facility] [address] [place], [place], [address] Phone: [phone]   Fax:  [phone]  Physical Therapy Treatment  Patient Details  Name: Russell Dillon MRN: [id number] Date of Birth: August 15, 1946 Referring Provider (PT): Marton Redwood, MD   Encounter Date: 02/10/2020  PT End of Session - 02/10/20 1408    Visit Number  52    Number of Visits  24   recert for 16 more visits due to progress   Date for PT Re-Evaluation  03/21/20   written for 60 day POC   Authorization Type  MCR, AARP, 10th visit progress notes    Progress Note Due on Visit  50    PT Start Time  1400    PT Stop Time  1440    PT Time Calculation (min)  40 min    Equipment Utilized During Treatment  Gait belt    Activity Tolerance  Patient tolerated treatment well;No increased pain    Behavior During Therapy  WFL for tasks assessed/performed       Past Medical History:  Diagnosis Date  . Acute metabolic encephalopathy 9/56/3875  . Aortic valve regurgitation 10/16/2018   Echo 07/28/2017:  EF 60-65, moderate AI, aortic root and ascending aorta mildly dilated (40 mm), ascending aorta 43 mm, MAC, mild MR, mild LAE, moderate RV enlargement, trivial TR // Echo 12/19: EF 60-65, normal wall motion, mild AI, moderate BAE, ascending aorta 43 mm, aortic root 39 mm  . BPH (benign prostatic hyperplasia)   . BPH (benign prostatic hypertrophy)   . Chronic anticoagulation   . Chronic atrial fibrillation ([place])   . Diastolic dysfunction October 2010   Normal LV systolic function  . Hx SBO   . Hyperlipidemia   . Hypertension   . Patellar tendon rupture, left, initial encounter 02/10/2019  . Severe sepsis ([place]) 05/05/2019  . Small bowel obstruction [facility])     Past Surgical History:  Procedure Laterality Date  . APPENDECTOMY  2004  . APPLICATION OF WOUND VAC Left 02/11/2019   Procedure: Application Of Wound Vac;  Surgeon: Altamese [place], MD;  Location: [facility];   Service: Orthopedics;  Laterality: Left;  . CARDIOVASCULAR STRESS TEST  09/30/2007   EF 67%  No ischemia.   Marland Kitchen CARDIOVERSION  05/01/2004  . KNEE ARTHROSCOPY Left 02/11/2019   Procedure: ARTHROSCOPY LEFT KNEE;  Surgeon: Altamese [place], MD;  [facility];  Service: Orthopedics;  Laterality: Left;  . LAPAROTOMY N/A 05/11/2019   Procedure: EXPLORATORY LAPAROTOMY;  Surgeon: Rolm Bookbinder, MD;  Location: [facility] [place];  Service: General;  Laterality: N/A;  . LYSIS OF ADHESION N/A 05/11/2019   Procedure: LYSIS OF ADHESION;  Surgeon: Rolm Bookbinder, MD;  Location: [facility];  Service: General;  Laterality: N/A;  . PATELLAR TENDON REPAIR Left 02/11/2019   Procedure: LEFT PATELLA TENDON REPAIR;  Surgeon: Altamese [place], MD;  Location: [facility];  Service: Orthopedics;  Laterality: Left;  . SMALL INTESTINE SURGERY  2004  . US ECHOCARDIOGRAPHY  09/05/2009   ef 55-60%    There were no vitals filed for this visit.  Subjective Assessment - 02/10/20 1406    Subjective  Pt reports doing well. Wife a little worried about when he stops PT and thought it was today. PT let her know that he had 2 visits left next week.    Patient is accompained by:  Family member   spouse [name]   Pertinent History  PMH: recent hospitilization 05/22/19 atrial fibrillation, dementia, essential hypertension, HLD,  diastolic congestive heart failure, aortic valve regurgitation, BPH, and recent small bowel obstruction with recent surgical intervention with lysis of adhesions    Limitations  Walking;Standing    How long can you stand comfortably?  can not stand on his own    How long can you walk comfortably?  can not walk on his own    Patient Stated Goals  to walk again    Currently in Pain?  No/denies                       [facility] Adult PT Treatment/Exercise - 02/10/20 1404      Transfers   Transfers  Sit to Stand;Stand to Sit    Sit to Stand  4: Min guard    Five time sit to stand comments   Pt performed 5 sit to stands  from edge of mat with verbal cues to push with both hands on mat and lean forward. Pt at times has feet too far in front and leans posterior when first gets up.    Stand to Sit  4: Min guard      Ambulation/Gait   Ambulation/Gait  Yes    Ambulation/Gait Assistance  5: Supervision    Ambulation/Gait Assistance Details  Pt was given verbal cues to keep walker close and slow down at times. Pt has poor eccentric DF control with foot slap noted on left.    Ambulation Distance (Feet)  920 Feet    Assistive device  Rolling walker    Gait Pattern  Step-through pattern;Narrow base of support;Trunk flexed    Ambulation Surface  Level;Indoor    Stairs  Yes    Stairs Assistance  4: Min guard;5: Supervision    Stairs Assistance Details (indicate cue type and reason)  Pt was given verbal cues for sequencing with descent. Pt comes down more to the side with descent.    Stair Management Technique  Two rails;Step to pattern    Number of Stairs  8    Height of Stairs  6    Gait Comments  Pt ambulated 6 laps around gym (2 walking normal, 1 with stop/go on command, 1 with head turns looking to therapist standing on each side, 1 with looking up/down, 2 with weaving in and out of 7 cones closely spaced to just fit walker through, 1 with tapping 7 cones with stepping over). Each lap 115'. Pt needed single step commands with activities and was challenged at times with performing correctly especially with cone activities. Was able to control walker to get through closely placed cones but started to "cheat" going over cone towards end.      Neuro Re-ed    Neuro Re-ed Details   In // bars: step-ups on 4" step x 4 with left leg. Gait in // bars marching forwards and then backwards gait x 3 laps with verbal cues for form, sidestepping x 2 laps with verbal cues for form. Pt had bilateral UE support for all activities. PT also cued to try to keep head up for more erect posture.      Exercises   Exercises  Other Exercises       Knee/Hip Exercises: Aerobic   Other Aerobic  Sci Fit x 10 min level 7. HR=102 after.             PT Education - 02/10/20 1409    Education Details  Spoke with wife about gym equipment options to include seated stepper or recumbant  bike. She was going to talk to home aide about coming with her and check with doctor as well.    Person(s) Educated  Spouse    Methods  Explanation    Comprehension  Verbalized understanding       PT Short Term Goals - 01/25/20 1410      PT SHORT TERM GOAL #1   Title  Pt will be able to ambulate at least 300' with RW with supervision in order to improve mobility. ALL STGS DUE 01/19/20    Baseline  300' indoors with supervision, min guard over outdoor surfaces    Status  Achieved      PT SHORT TERM GOAL #2   Title  Pt will be able to perform sit to stand with supersion with RW from w/c and mat table.    Baseline  01/25/20: met in session today    Status  Achieved      PT SHORT TERM GOAL #3   Title  Pt will be able to perform 4 steps with step to pattern and B handrails with min guard.    Baseline  01/25/20: met in session today    Status  Achieved        PT Long Term Goals - 12/22/19 1547      PT LONG TERM GOAL #1   Title  Pt will be I and compliant with final HEP. (target for all LTG 12 weeks 02/16/20)    Time  8    Period  Weeks    Status  New    Target Date  02/16/20      PT LONG TERM GOAL #2   Title  Pt will be able to perform sit to stand mod I.    Baseline  min guard/ one episode of min A from RW.    Time  8    Period  Weeks    Status  On-going      PT LONG TERM GOAL #3   Title  Pt will be able perform stand pivot transfers with RW with supervision to decrease caregiver burden.    Baseline  not formally assessed today.    Time  8    Period  Weeks    Status  New      PT LONG TERM GOAL #4   Title  Pt will be able to ambulate 300' over level indoor and unlevel outdoor paved surfaces with supervision/min guard in order to  improve functional mobility.    Time  8    Period  Weeks    Status  New      PT LONG TERM GOAL #5   Title  Pt will improve gait speed to at least 2.6 ft/sec to demo improved functional mobility.    Baseline  2.11 ft/sec on 12/22/19    Time  8    Period  Weeks    Status  New      PT LONG TERM GOAL #6   Title  Pt will perform 12 steps with step to pattern and BUE assist on handrails with min guard in order to perform stairs to reach the 2nd floor of his house.    Baseline  --    Time  8    Period  Weeks    Status  New            Plan - 02/10/20 1502    Clinical Impression Statement  PT worked on different dynamic gait activities adding in head  turns and obstacles to negotiate. Pt needed single step commands to follow but was able to perform with only supervision/CGA. Did well on stairs today with minimal cues for sequence.    Personal Factors and Comorbidities  Age;Comorbidity 1;Comorbidity 2;Comorbidity 3+;Past/Current Experience;Fitness    Comorbidities  PMH: recent hospitilization 05/22/19 atrial fibrillation, dementia, essential hypertension, HLD, diastolic congestive heart failure, aortic valve regurgitation, BPH, and recent small bowel obstruction with recent surgical intervention with lysis of adhesions    Examination-Activity Limitations  --   all ADL's   Examination-Participation Restrictions  --   All   Stability/Clinical Decision Making  Evolving/Moderate complexity    Rehab Potential  Fair    PT Frequency  Other (comment)   2-3   PT Duration  12 weeks    PT Treatment/Interventions  ADLs/Self Care Home Management;Aquatic Therapy;Cryotherapy;Moist Heat;DME Instruction;Gait training;Functional mobility training;Therapeutic activities;Therapeutic exercise;Balance training;Neuromuscular re-education;Manual techniques;Wheelchair mobility training;Patient/family education;Passive range of motion;Taping    PT Next Visit Plan  Discharge planned for next week. continue with  strengthening on Scifit, gait with RW -outdoors/curb training and around obstacles, continue to work on stairs . work on Conservation officer, nature for discharge coming up in a couple of sessions    PT [place], sitting marches, sitting hamstring curls, SLR, standing at kitchen sink- UE reaching, marching in place, mini squats    Consulted and Agree with Plan of Care  Patient;Family member/caregiver    Family Member Consulted  wife       Patient will benefit from skilled therapeutic intervention in order to improve the following deficits and impairments:  Decreased balance, Abnormal gait, Decreased endurance, Decreased range of motion, Decreased strength, Decreased mobility, Difficulty walking, Postural dysfunction, Pain  Visit Diagnosis: Muscle weakness (generalized)  Other abnormalities of gait and mobility     Problem List Patient Active Problem List   Diagnosis Date Noted  . Bacteremia due to Enterococcus 05/24/2019  . Urinary tract infection 05/24/2019  . Chronic diastolic CHF (congestive heart failure) ([place]) 05/22/2019  . Gout 05/22/2019  . Sepsis ([place]) 05/22/2019  . Protein-calorie malnutrition, severe 05/18/2019  . AKI (acute kidney injury) ([place]) 05/05/2019  . Transient hypotension 05/05/2019  . Sepsis, Gram negative ([place]) 03/18/2019  . Hypoalbuminemia   . Leukocytosis   . Hyperglycemia   . Persistent atrial fibrillation with RVR ([place])   . Acute on chronic anemia   . Rupture of left patellar tendon 02/15/2019  . Postoperative pain   . Bipolar 1 disorder ([place]) 02/14/2019  . Dementia without behavioral disturbance ([place]) 02/14/2019  . Patellar tendon rupture, left, initial encounter 02/10/2019  . SIRS (systemic inflammatory response syndrome) ([place]) 02/09/2019  . Osteomyelitis ([place]) 02/09/2019  . Acute blood loss anemia 02/09/2019  . Aortic valve regurgitation 10/16/2018  . SBO (small bowel obstruction) ([place]) 07/08/2013  . Hyperlipidemia 12/20/2011  . HTN  (hypertension) 06/04/2011  . Atrial fibrillation, chronic ([place]) 03/01/2011  . Long term (current) use of anticoagulants 03/01/2011  . GERD 10/11/2009  . DIVERTICULOSIS OF COLON 10/11/2009    Electa Sniff, PT, DPT, NCS 02/10/2020, 3:05 PM  [facility] [address] [place], [place], [address] Phone: [phone]   Fax:  [phone]  Name: RAYANE GALLARDO MRN: [id number] Date of Birth: 03-22-1946

## 2020-02-15 ENCOUNTER — Ambulatory Visit: Payer: Medicare Other | Admitting: Physical Therapy

## 2020-02-15 ENCOUNTER — Other Ambulatory Visit: Payer: Self-pay

## 2020-02-15 ENCOUNTER — Encounter: Payer: Self-pay | Admitting: Physical Therapy

## 2020-02-15 DIAGNOSIS — M6281 Muscle weakness (generalized): Secondary | ICD-10-CM

## 2020-02-15 DIAGNOSIS — G8929 Other chronic pain: Secondary | ICD-10-CM

## 2020-02-15 DIAGNOSIS — M25562 Pain in left knee: Secondary | ICD-10-CM | POA: Diagnosis not present

## 2020-02-15 DIAGNOSIS — R2689 Other abnormalities of gait and mobility: Secondary | ICD-10-CM

## 2020-02-16 ENCOUNTER — Ambulatory Visit: Payer: Medicare Other | Attending: Internal Medicine

## 2020-02-16 DIAGNOSIS — Z23 Encounter for immunization: Secondary | ICD-10-CM

## 2020-02-16 NOTE — Therapy (Signed)
[facility] [address] [place], [place], [address] Phone: [phone]   Fax:  [phone]  Physical Therapy Treatment  Patient Details  Name: Russell Dillon MRN: [id number] Date of Birth: 1946-11-07 Referring Provider (PT): Marton Redwood, MD   Encounter Date: 02/15/2020  PT End of Session - 02/15/20 1410    Visit Number  42    Number of Visits  51   recert for 16 more visits due to progress   Date for PT Re-Evaluation  03/21/20   written for 60 day POC   Authorization Type  MCR, AARP, 10th visit progress notes    Progress Note Due on Visit  59    PT Start Time  1402    PT Stop Time  1445    PT Time Calculation (min)  43 min    Equipment Utilized During Treatment  Gait belt    Activity Tolerance  Patient tolerated treatment well;No increased pain    Behavior During Therapy  WFL for tasks assessed/performed       Past Medical History:  Diagnosis Date  . Acute metabolic encephalopathy 4/70/9628  . Aortic valve regurgitation 10/16/2018   Echo 07/28/2017:  EF 60-65, moderate AI, aortic root and ascending aorta mildly dilated (40 mm), ascending aorta 43 mm, MAC, mild MR, mild LAE, moderate RV enlargement, trivial TR // Echo 12/19: EF 60-65, normal wall motion, mild AI, moderate BAE, ascending aorta 43 mm, aortic root 39 mm  . BPH (benign prostatic hyperplasia)   . BPH (benign prostatic hypertrophy)   . Chronic anticoagulation   . Chronic atrial fibrillation ([place])   . Diastolic dysfunction October 2010   Normal LV systolic function  . Hx SBO   . Hyperlipidemia   . Hypertension   . Patellar tendon rupture, left, initial encounter 02/10/2019  . Severe sepsis ([place]) 05/05/2019  . Small bowel obstruction [facility])     Past Surgical History:  Procedure Laterality Date  . APPENDECTOMY  2004  . APPLICATION OF WOUND VAC Left 02/11/2019   Procedure: Application Of Wound Vac;  Surgeon: Altamese [place], MD;  Location: [facility];   Service: Orthopedics;  Laterality: Left;  . CARDIOVASCULAR STRESS TEST  09/30/2007   EF 67%  No ischemia.   Marland Kitchen CARDIOVERSION  05/01/2004  . KNEE ARTHROSCOPY Left 02/11/2019   Procedure: ARTHROSCOPY LEFT KNEE;  Surgeon: Altamese [place], MD;  Location: [facility];  Service: Orthopedics;  Laterality: Left;  . LAPAROTOMY N/A 05/11/2019   Procedure: EXPLORATORY LAPAROTOMY;  Surgeon: Rolm Bookbinder, MD;  Location: [facility];  Service: General;  Laterality: N/A;  . LYSIS OF ADHESION N/A 05/11/2019   Procedure: LYSIS OF ADHESION;  Surgeon: Rolm Bookbinder, MD;  Location: [facility];  Service: General;  Laterality: N/A;  . PATELLAR TENDON REPAIR Left 02/11/2019   Procedure: LEFT PATELLA TENDON REPAIR;  Surgeon: Altamese [place], MD;  Location: [facility];  Service: Orthopedics;  Laterality: Left;  . SMALL INTESTINE SURGERY  2004  . US ECHOCARDIOGRAPHY  09/05/2009   ef 55-60%    There were no vitals filed for this visit.  Subjective Assessment - 02/15/20 1408    Subjective  No new complaints. No falls.    Patient is accompained by:  Family member   spouse, [name]   Pertinent History  PMH: recent hospitilization 05/22/19 atrial fibrillation, dementia, essential hypertension, HLD, diastolic congestive heart failure, aortic valve regurgitation, BPH, and recent small bowel obstruction with recent surgical intervention with lysis of adhesions    How  long can you stand comfortably?  can not stand on his own    How long can you walk comfortably?  can not walk on his own    Patient Stated Goals  to walk again    Currently in Pain?  No/denies    Pain Score  0-No pain            [facility] Adult PT Treatment/Exercise - 02/15/20 1417      Transfers   Transfers  Sit to Stand;Stand to Sit    Sit to Stand  4: Min guard;With upper extremity assist;From bed;From chair/3-in-1    Stand to Sit  4: Min guard;With upper extremity assist;Without upper extremity assist;To bed;To chair/3-in-1      Ambulation/Gait   Ambulation/Gait  Yes     Ambulation/Gait Assistance  5: Supervision;4: Min guard    Ambulation/Gait Assistance Details  2 laps of gait indoors working on scanning all directions, right/left turns and obstacle negotiations with RW, cues to stay closer to walker with gait.  gait outdoors over paved surfaces with min guard to supervision with continued cues to stay closer to walker. pt with occasional bouts of increased speed with gait.     Ambulation Distance (Feet)  220 Feet   x2, >/=600 in/outdoors   Assistive device  Rolling walker    Gait Pattern  Step-through pattern;Narrow base of support;Trunk flexed    Ambulation Surface  Level;Indoor;Unlevel;Outdoor;Paved    Stairs  Yes    Stairs Assistance  4: Min guard;5: Supervision    Stairs Assistance Details (indicate cue type and reason)  supervision to ascend, min guard with cues to descend     Stair Management Technique  Two rails;Step to pattern;Forwards    Number of Stairs  4   x1   Height of Stairs  6    Curb  Other (comment)   min guard assist    Curb Details (indicate cue type and reason)  with RW on outdoor curb. no cues needed, min guard assist for safety.      Knee/Hip Exercises: Aerobic   Other Aerobic  Scifit level 7, UE/LE's with goal >/= 50 rpm for 10 minutes for strengthening/activity tolerance.          PT Short Term Goals - 01/25/20 1410      PT SHORT TERM GOAL #1   Title  Pt will be able to ambulate at least 300' with RW with supervision in order to improve mobility. ALL STGS DUE 01/19/20    Baseline  300' indoors with supervision, min guard over outdoor surfaces    Status  Achieved      PT SHORT TERM GOAL #2   Title  Pt will be able to perform sit to stand with supersion with RW from w/c and mat table.    Baseline  01/25/20: met in session today    Status  Achieved      PT SHORT TERM GOAL #3   Title  Pt will be able to perform 4 steps with step to pattern and B handrails with min guard.    Baseline  01/25/20: met in session today     Status  Achieved        PT Long Term Goals - 12/22/19 1547      PT LONG TERM GOAL #1   Title  Pt will be I and compliant with final HEP. (target for all LTG 12 weeks 02/16/20)    Time  8    Period  Weeks  Status  New    Target Date  02/16/20      PT LONG TERM GOAL #2   Title  Pt will be able to perform sit to stand mod I.    Baseline  min guard/ one episode of min A from RW.    Time  8    Period  Weeks    Status  On-going      PT LONG TERM GOAL #3   Title  Pt will be able perform stand pivot transfers with RW with supervision to decrease caregiver burden.    Baseline  not formally assessed today.    Time  8    Period  Weeks    Status  New      PT LONG TERM GOAL #4   Title  Pt will be able to ambulate 300' over level indoor and unlevel outdoor paved surfaces with supervision/min guard in order to improve functional mobility.    Time  8    Period  Weeks    Status  New      PT LONG TERM GOAL #5   Title  Pt will improve gait speed to at least 2.6 ft/sec to demo improved functional mobility.    Baseline  2.11 ft/sec on 12/22/19    Time  8    Period  Weeks    Status  New      PT LONG TERM GOAL #6   Title  Pt will perform 12 steps with step to pattern and BUE assist on handrails with min guard in order to perform stairs to reach the 2nd floor of his house.    Baseline  --    Time  8    Period  Weeks    Status  New            Plan - 02/15/20 1411    Clinical Impression Statement  Today's skilled session continued to focus on strengthening/activity tolerance with Scifit and gait with RW on various surfaces. No balance loss with gait outdoors. Pt required only one person assist with cues on stairs this session (second person distant supervision/stand by). PTA gave spouse information on [employer] silver sneakers and ACT by [facility] as possible options for continued exercise options post discharge this week.    Personal Factors and Comorbidities  Age;Comorbidity 1;Comorbidity  2;Comorbidity 3+;Past/Current Experience;Fitness    Comorbidities  PMH: recent hospitilization 05/22/19 atrial fibrillation, dementia, essential hypertension, HLD, diastolic congestive heart failure, aortic valve regurgitation, BPH, and recent small bowel obstruction with recent surgical intervention with lysis of adhesions    Examination-Activity Limitations  --   all ADL's   Examination-Participation Restrictions  --   All   Stability/Clinical Decision Making  Evolving/Moderate complexity    Rehab Potential  Fair    PT Frequency  Other (comment)   2-3   PT Duration  12 weeks    PT Treatment/Interventions  ADLs/Self Care Home Management;Aquatic Therapy;Cryotherapy;Moist Heat;DME Instruction;Gait training;Functional mobility training;Therapeutic activities;Therapeutic exercise;Balance training;Neuromuscular re-education;Manual techniques;Wheelchair mobility training;Patient/family education;Passive range of motion;Taping    PT Next Visit Plan  check LTGs for anticipated discharge at next visit    PT [place], sitting marches, sitting hamstring curls, SLR, standing at kitchen sink- UE reaching, marching in place, mini squats    Consulted and Agree with Plan of Care  Patient;Family member/caregiver    Family Member Consulted  wife       Patient will benefit from skilled therapeutic intervention in order to improve  the following deficits and impairments:  Decreased balance, Abnormal gait, Decreased endurance, Decreased range of motion, Decreased strength, Decreased mobility, Difficulty walking, Postural dysfunction, Pain  Visit Diagnosis: Muscle weakness (generalized)  Other abnormalities of gait and mobility  Chronic pain of left knee     Problem List Patient Active Problem List   Diagnosis Date Noted  . Bacteremia due to Enterococcus 05/24/2019  . Urinary tract infection 05/24/2019  . Chronic diastolic CHF (congestive heart failure) ([place]) 05/22/2019  . Gout  05/22/2019  . Sepsis ([place]) 05/22/2019  . Protein-calorie malnutrition, severe 05/18/2019  . AKI (acute kidney injury) ([place]) 05/05/2019  . Transient hypotension 05/05/2019  . Sepsis, Gram negative ([place]) 03/18/2019  . Hypoalbuminemia   . Leukocytosis   . Hyperglycemia   . Persistent atrial fibrillation with RVR ([place])   . Acute on chronic anemia   . Rupture of left patellar tendon 02/15/2019  . Postoperative pain   . Bipolar 1 disorder ([place]) 02/14/2019  . Dementia without behavioral disturbance ([place]) 02/14/2019  . Patellar tendon rupture, left, initial encounter 02/10/2019  . SIRS (systemic inflammatory response syndrome) ([place]) 02/09/2019  . Osteomyelitis ([place]) 02/09/2019  . Acute blood loss anemia 02/09/2019  . Aortic valve regurgitation 10/16/2018  . SBO (small bowel obstruction) ([place]) 07/08/2013  . Hyperlipidemia 12/20/2011  . HTN (hypertension) 06/04/2011  . Atrial fibrillation, chronic ([place]) 03/01/2011  . Long term (current) use of anticoagulants 03/01/2011  . GERD 10/11/2009  . DIVERTICULOSIS OF COLON 10/11/2009    Willow Ora, PTA, [facility] [address], [place], [place] [address] [phone] 02/16/20, 7:50 AM   Name: Russell Dillon MRN: [id number] Date of Birth: 13-Oct-1946

## 2020-02-16 NOTE — Progress Notes (Signed)
   Covid-19 Vaccination Clinic  Name:  Russell Dillon    MRN: [id number] DOB: 1946/04/13  02/16/2020  [name] was observed post Covid-19 immunization for 15 minutes without incident. He was provided with Vaccine Information Sheet and instruction to access the V-Safe system.   [name] was instructed to call 911 with any severe reactions post vaccine: Marland Kitchen Difficulty breathing  . Swelling of face and throat  . A fast heartbeat  . A bad rash all over body  . Dizziness and weakness   Immunizations Administered    Name Date Dose VIS Date Route   Pfizer COVID-19 Vaccine 02/16/2020 12:43 PM 0.3 mL 10/22/2019 Intramuscular   Manufacturer: [employer]   Lot: [id number]   [place]: [id number]

## 2020-02-18 ENCOUNTER — Other Ambulatory Visit: Payer: Self-pay

## 2020-02-18 ENCOUNTER — Ambulatory Visit: Payer: Medicare Other | Admitting: Physical Therapy

## 2020-02-18 DIAGNOSIS — M6281 Muscle weakness (generalized): Secondary | ICD-10-CM

## 2020-02-18 DIAGNOSIS — G8929 Other chronic pain: Secondary | ICD-10-CM | POA: Diagnosis not present

## 2020-02-18 DIAGNOSIS — M25562 Pain in left knee: Secondary | ICD-10-CM | POA: Diagnosis not present

## 2020-02-18 DIAGNOSIS — R2689 Other abnormalities of gait and mobility: Secondary | ICD-10-CM | POA: Diagnosis not present

## 2020-02-18 NOTE — Therapy (Signed)
[facility] [address] [place], [place], [address] Phone: [phone]   Fax:  [phone]  Physical Therapy Treatment/Discharge Summary  Patient Details  Name: Russell Dillon MRN: [id number] Date of Birth: November 28, 1945 Referring Provider (PT): Marton Redwood, MD   Encounter Date: 02/18/2020  PT End of Session - 02/18/20 1834    Visit Number  37    Number of Visits  55   recert for 16 more visits due to progress   Date for PT Re-Evaluation  03/21/20   written for 60 day POC   Authorization Type  MCR, AARP, 10th visit progress notes    Progress Note Due on Visit  42    PT Start Time  1402    PT Stop Time  1441    PT Time Calculation (min)  39 min    Equipment Utilized During Treatment  Gait belt    Activity Tolerance  Patient tolerated treatment well;No increased pain    Behavior During Therapy  WFL for tasks assessed/performed       Past Medical History:  Diagnosis Date  . Acute metabolic encephalopathy 3/66/2947  . Aortic valve regurgitation 10/16/2018   Echo 07/28/2017:  EF 60-65, moderate AI, aortic root and ascending aorta mildly dilated (40 mm), ascending aorta 43 mm, MAC, mild MR, mild LAE, moderate RV enlargement, trivial TR // Echo 12/19: EF 60-65, normal wall motion, mild AI, moderate BAE, ascending aorta 43 mm, aortic root 39 mm  . BPH (benign prostatic hyperplasia)   . BPH (benign prostatic hypertrophy)   . Chronic anticoagulation   . Chronic atrial fibrillation ([place])   . Diastolic dysfunction October 2010   Normal LV systolic function  . Hx SBO   . Hyperlipidemia   . Hypertension   . Patellar tendon rupture, left, initial encounter 02/10/2019  . Severe sepsis ([place]) 05/05/2019  . Small bowel obstruction [facility])     Past Surgical History:  Procedure Laterality Date  . APPENDECTOMY  2004  . APPLICATION OF WOUND VAC Left 02/11/2019   Procedure: Application Of Wound Vac;  Surgeon: Altamese [place] Beach, MD;   Location: [facility];  Service: Orthopedics;  Laterality: Left;  . CARDIOVASCULAR STRESS TEST  09/30/2007   EF 67%  No ischemia.   Marland Kitchen CARDIOVERSION  05/01/2004  . KNEE ARTHROSCOPY Left 02/11/2019   Procedure: ARTHROSCOPY LEFT KNEE;  Surgeon: Altamese Leisure Knoll, MD;  Location: [facility];  Service: Orthopedics;  Laterality: Left;  . LAPAROTOMY N/A 05/11/2019   Procedure: EXPLORATORY LAPAROTOMY;  Surgeon: Rolm Bookbinder, MD;  Location: [facility];  Service: General;  Laterality: N/A;  . LYSIS OF ADHESION N/A 05/11/2019   Procedure: LYSIS OF ADHESION;  Surgeon: Rolm Bookbinder, MD;  Location: [place];  Service: General;  Laterality: N/A;  . PATELLAR TENDON REPAIR Left 02/11/2019   Procedure: LEFT PATELLA TENDON REPAIR;  Surgeon: Altamese Crofton, MD;  Location: [place];  Service: Orthopedics;  Laterality: Left;  . SMALL INTESTINE SURGERY  2004  . US ECHOCARDIOGRAPHY  09/05/2009   ef 55-60%    There were no vitals filed for this visit.  Subjective Assessment - 02/18/20 1403    Subjective  Found out that their insurance will cover the [facility] - will start going going next week.    Patient is accompained by:  Family member   spouse, [name]   Pertinent History  PMH: recent hospitilization 05/22/19 atrial fibrillation, dementia, essential hypertension, HLD, diastolic congestive heart failure, aortic valve regurgitation, BPH, and recent small bowel obstruction  with recent surgical intervention with lysis of adhesions    How long can you stand comfortably?  can not stand on his own    How long can you walk comfortably?  can not walk on his own    Patient Stated Goals  to walk again    Currently in Pain?  No/denies                       [facility] Adult PT Treatment/Exercise - 02/18/20 0001      Transfers   Transfers  Sit to Stand;Stand to Sit    Sit to Stand  4: Min guard;With upper extremity assist;From bed;From chair/3-in-1;5: Supervision    Sit to Stand Details  Verbal cues for technique;Tactile cues for  placement;Visual cues/gestures for precautions/safety;Visual cues/gestures for sequencing    Stand to Sit  4: Min guard;With upper extremity assist;Without upper extremity assist;To bed;To chair/3-in-1      Ambulation/Gait   Ambulation/Gait  Yes    Ambulation/Gait Assistance  5: Supervision    Ambulation/Gait Assistance Details  throughout therapy gym, needs cues to remain close to RW and slow down at times    Ambulation Distance (Feet)  350 Feet    Assistive device  Rolling walker    Gait Pattern  Step-through pattern;Narrow base of support;Trunk flexed    Ambulation Surface  Level;Indoor    Gait velocity  10.5 seconds = 3.1 ft/sec    Stairs  Yes    Stairs Assistance  4: Min guard;5: Supervision    Stairs Assistance Details (indicate cue type and reason)  supervision to ascend, min guard when descending - pt needing verbal cues when ascending to fully place foot on the step     Stair Management Technique  Two rails;Step to pattern;Forwards    Number of Stairs  12    Height of Stairs  6      Therapeutic Activites    Therapeutic Activities  Other Therapeutic Activities    Other Therapeutic Activities  discussed community fitness plan post discharge - pt's wife states that their insurance will cover the [employer], is going to go to use aerobic machines like the [employer] and potentially looking into a personal trainer there for use of some strengthening machines      Exercises   Exercises  Other Exercises    Other Exercises   reviewed pt's current HEP with pt and pt's wife - pt's wife states he is performing daily with pt's aide       Knee/Hip Exercises: Aerobic   Other Aerobic  Scifit level 7, UE/LE's with goal >/= 50 rpm for 9 minutes for strengthening/activity tolerance.               PT Short Term Goals - 01/25/20 1410      PT SHORT TERM GOAL #1   Title  Pt will be able to ambulate at least 300' with RW with supervision in order to improve mobility. ALL STGS DUE 01/19/20     Baseline  300' indoors with supervision, min guard over outdoor surfaces    Status  Achieved      PT SHORT TERM GOAL #2   Title  Pt will be able to perform sit to stand with supersion with RW from w/c and mat table.    Baseline  01/25/20: met in session today    Status  Achieved      PT SHORT TERM GOAL #3   Title  Pt will be able  to perform 4 steps with step to pattern and B handrails with min guard.    Baseline  01/25/20: met in session today    Status  Achieved        PT Long Term Goals - 02/18/20 1415      PT LONG TERM GOAL #1   Title  Pt will be I and compliant with final HEP. (target for all LTG 12 weeks 02/16/20)    Time  8    Period  Weeks    Status  Achieved      PT LONG TERM GOAL #2   Title  Pt will be able to perform sit to stand mod I.    Baseline  supervision/min guard from mat table with RW    Time  8    Period  Weeks    Status  Not Met      PT LONG TERM GOAL #3   Title  Pt will be able perform stand pivot transfers with RW with supervision to decrease caregiver burden.    Baseline  met on 02/18/20    Time  8    Period  Weeks    Status  Achieved      PT LONG TERM GOAL #4   Title  Pt will be able to ambulate 300' over level indoor and unlevel outdoor paved surfaces with supervision/min guard in order to improve functional mobility.    Baseline  met in previous sessions    Time  8    Period  Weeks    Status  Achieved      PT LONG TERM GOAL #5   Title  Pt will improve gait speed to at least 2.6 ft/sec to demo improved functional mobility.    Baseline  10.5 seconds = 3.1 ft/sec    Time  8    Period  Weeks    Status  Achieved      PT LONG TERM GOAL #6   Title  Pt will perform 12 steps with step to pattern and BUE assist on handrails with min guard in order to perform stairs to reach the 2nd floor of his house.    Baseline  12 steps with step to pattern and BUE asisst with supervision ascending and close min guard descending - needs cues for proper foot  placement on the steps.    Time  8    Period  Weeks    Status  Achieved         PHYSICAL THERAPY DISCHARGE SUMMARY  Visits from Start of Care: 13  Current functional level related to goals / functional outcomes: See LTGs.   Remaining deficits: Postural deficits, decr functional LE strength, impaired balance.   Education / Equipment: HEP  Plan: Patient agrees to discharge.  Patient goals were met. Patient is being discharged due to meeting the stated rehab goals.  ?????         Plan - 02/18/20 1840    Clinical Impression Statement  Focus of today's skilled session was assessing LTGs for discharge. Pt has met 5 out of 6 LTGs. 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 Able to ascend/descend 12 steps today with supervision ascending and min guard while descending with only one person assist. Reviewed with pt and pt's wife HEP and community fitness program post discharge. Pt has made excellent progress in PT at this time and will be discharged, pt and pt's wife in agreement.  Personal Factors and Comorbidities  Age;Comorbidity 1;Comorbidity 2;Comorbidity 3+;Past/Current Experience;Fitness    Comorbidities  PMH: recent hospitilization 05/22/19 atrial fibrillation, dementia, essential hypertension, HLD, diastolic congestive heart failure, aortic valve regurgitation, BPH, and recent small bowel obstruction with recent surgical intervention with lysis of adhesions    Examination-Activity Limitations  --   all ADL's   Examination-Participation Restrictions  --   All   Stability/Clinical Decision Making  Evolving/Moderate complexity    Rehab Potential  Fair    PT Frequency  Other (comment)   2-3   PT Duration  12 weeks    PT Treatment/Interventions  ADLs/Self Care Home Management;Aquatic Therapy;Cryotherapy;Moist Heat;DME Instruction;Gait training;Functional mobility training;Therapeutic activities;Therapeutic exercise;Balance  training;Neuromuscular re-education;Manual techniques;Wheelchair mobility training;Patient/family education;Passive range of motion;Taping    PT Home Exercise Plan  LAQ, sitting marches, sitting hamstring curls, SLR, standing at kitchen sink- UE reaching, marching in place, mini squats    Consulted and Agree with Plan of Care  Patient;Family member/caregiver    Family Member Consulted  wife       Patient will benefit from skilled therapeutic intervention in order to improve the following deficits and impairments:  Decreased balance, Abnormal gait, Decreased endurance, Decreased range of motion, Decreased strength, Decreased mobility, Difficulty walking, Postural dysfunction, Pain  Visit Diagnosis: Other abnormalities of gait and mobility  Muscle weakness (generalized)     Problem List Patient Active Problem List   Diagnosis Date Noted  . Bacteremia due to Enterococcus 05/24/2019  . Urinary tract infection 05/24/2019  . Chronic diastolic CHF (congestive heart failure) ([place]) 05/22/2019  . Gout 05/22/2019  . Sepsis ([place]) 05/22/2019  . Protein-calorie malnutrition, severe 05/18/2019  . AKI (acute kidney injury) ([place]) 05/05/2019  . Transient hypotension 05/05/2019  . Sepsis, Gram negative ([place]) 03/18/2019  . Hypoalbuminemia   . Leukocytosis   . Hyperglycemia   . Persistent atrial fibrillation with RVR ([place])   . Acute on chronic anemia   . Rupture of left patellar tendon 02/15/2019  . Postoperative pain   . Bipolar 1 disorder ([place]) 02/14/2019  . Dementia without behavioral disturbance ([place]) 02/14/2019  . Patellar tendon rupture, left, initial encounter 02/10/2019  . SIRS (systemic inflammatory response syndrome) ([place]) 02/09/2019  . Osteomyelitis ([place]) 02/09/2019  . Acute blood loss anemia 02/09/2019  . Aortic valve regurgitation 10/16/2018  . SBO (small bowel obstruction) ([place]) 07/08/2013  . Hyperlipidemia 12/20/2011  . HTN (hypertension) 06/04/2011  . Atrial fibrillation,  chronic ([place]) 03/01/2011  . Long term (current) use of anticoagulants 03/01/2011  . GERD 10/11/2009  . DIVERTICULOSIS OF COLON 10/11/2009    Arliss Journey, PT,DPT  02/18/2020, 6:43 PM  [facility] [address] [place], [place], [address] Phone: [phone]   Fax:  [phone]  Name: Russell Dillon MRN: [id number] Date of Birth: July 15, 1946

## 2020-02-24 ENCOUNTER — Other Ambulatory Visit: Payer: Self-pay | Admitting: Psychiatry

## 2020-02-25 NOTE — Telephone Encounter (Signed)
Last visit 12/2018

## 2020-02-25 NOTE — Telephone Encounter (Signed)
Patient is taking this ultra low dose lithium 150 mg daily for its potential at slowing cognitive decline associated with Alzheimer's disease.  Is not being used to treat any mood disorder and the dose is sufficiently low that it should not require blood level monitoring. Okay refill

## 2020-03-09 ENCOUNTER — Other Ambulatory Visit: Payer: Medicare Other

## 2020-03-09 ENCOUNTER — Other Ambulatory Visit: Payer: Self-pay

## 2020-03-09 DIAGNOSIS — Z515 Encounter for palliative care: Secondary | ICD-10-CM

## 2020-03-09 NOTE — Progress Notes (Signed)
COMMUNITY PALLIATIVE CARE SW NOTE  PATIENT NAME: Russell Dillon DOB: [birth year] MRN: [id number]  PRIMARY CARE PROVIDER: Marton Redwood, MD  RESPONSIBLE PARTY:  Acct ID - Guarantor Home Phone Work Phone Relationship Acct Type  [id number] - [name],* [phone]  Self P/F     [place], [place],  [address]     PLAN OF CARE and INTERVENTIONS:             1. GOALS OF CARE/ ADVANCE CARE PLANNING: The goal is for patient to remain home and independent as possible. Patient is a DNR. 2. SOCIAL/EMOTIONAL/SPIRITUAL ASSESSMENT/ INTERVENTIONS: SW completed face-to-face visit with patient at his home, where he was present with his wife-[name] and private sitter-Nicole. SW observed patient outside with his sitter, ambulating around this driveway with his new walker. He invited SW inside his home to visit. Patient then got into his wheelchair and ambulated himself into the kitchen where his wife-[name] was present. We sat at the kitchen table to talk. Patient denied pain. He expressed excitement about his new walker and his ability to ambulate well. He expressed determination to continue to push himself and get better. SW engaged patient is life review and he talked about [place], being a marine and working for the city. SW note that patient repeated his stories and he often loss his train of thought. His wife report that patient's dementia persists, but patient is able to engage. His appetite remains good. His wife reported that they have purchased a new mattress and a stair lift for patient's safety and comfort. SW provided supportive presence, active listening, engaged patient is life review, assessed needs and comfort of patient, coping and needs of PCG, introduction as new palliative care social worker and reinforced access to support through the palliative care program.  3. PATIENT/CAREGIVER EDUCATION/ COPING: Patient is alert and oriented to self and situation.  His long-term memory is good as he engaged in life review, however he repeats himself and easily loss his train of thought. His wife rely on her faith and support from their daughter to cope. Having a Charity fundraiser is also helpful with patient's care and gives her time for self care.  4. PERSONAL EMERGENCY PLAN: 911 can be activated for emergencies. 5. COMMUNITY RESOURCES COORDINATION/ HEALTH CARE NAVIGATION: Patient has a private sitter-Nicole. He exercises at the [facility] daily. He has supportive friends and neighbors.   6. FINANCIAL/LEGAL CONCERNS/INTERVENTIONS: No financial or legal issues.     SOCIAL HX:  Social History   Tobacco Use  . Smoking status: Never Smoker  . Smokeless tobacco: Never Used  Substance Use Topics  . Alcohol use: No    Alcohol/week: 0.0 standard drinks    CODE STATUS:   Code Status: Prior Patient is a DNR. ADVANCED DIRECTIVES: N MOST FORM COMPLETE:  No HOSPICE EDUCATION PROVIDED: No  PPS: Patient is ambulating with his walker, but will also utilize the wheelchair. He is alert and oriented to self and situation, but repeats himself and easily looses his train of thought.   Visit duration was 60 minutes.       [address] [place], [place]

## 2020-03-10 ENCOUNTER — Telehealth: Payer: Self-pay

## 2020-03-10 NOTE — Telephone Encounter (Signed)
(  Late Entry) SW scheduled homevisit for 03/09/20 at 11:00am with patient's wife-[name].

## 2020-03-27 DIAGNOSIS — R972 Elevated prostate specific antigen [PSA]: Secondary | ICD-10-CM | POA: Diagnosis not present

## 2020-03-27 DIAGNOSIS — R351 Nocturia: Secondary | ICD-10-CM | POA: Diagnosis not present

## 2020-03-27 DIAGNOSIS — N401 Enlarged prostate with lower urinary tract symptoms: Secondary | ICD-10-CM | POA: Diagnosis not present

## 2020-04-05 ENCOUNTER — Other Ambulatory Visit: Payer: Medicare Other

## 2020-04-05 ENCOUNTER — Other Ambulatory Visit: Payer: Self-pay

## 2020-04-05 ENCOUNTER — Telehealth: Payer: Self-pay

## 2020-04-05 DIAGNOSIS — Z515 Encounter for palliative care: Secondary | ICD-10-CM

## 2020-04-05 NOTE — Telephone Encounter (Signed)
LATE ENTRY  Telephone call to patient to schedule palliative care visit with patient. Patient/family in agreement with home visit on 04/05/20@ 11:30am.

## 2020-04-05 NOTE — Progress Notes (Signed)
COMMUNITY PALLIATIVE CARE SW NOTE  PATIENT NAME: Russell Dillon DOB: 03/19/1946 MRN: [id number]  PRIMARY CARE PROVIDER: Marton Redwood, MD  RESPONSIBLE PARTY:  Acct ID - Guarantor Home Phone Work Phone Relationship Acct Type  [id number] - [place],* [phone]  Self P/F     [place], [place], [place] [address]     PLAN OF CARE and INTERVENTIONS:             1. GOALS OF CARE/ ADVANCE CARE PLANNING: The goal is for patient to remain at home as independent as possible. Patient is a DNR.  2. SOCIAL/EMOTIONAL/SPIRITUAL ASSESSMENT/ INTERVENTIONS:  SW completed face-to-face visit with patient at his home, where he was present with his wife-[name] and private sitter-Nicole. SW observed patient outside with his sitter-Nicole, ambulating around this driveway with his new walker. He invited SW inside his home to visit. His wife [name] joined the visit. Patient reported that he was doing well. He continues to go to the [facility] daily to workout, as well walking around his property. He denied pain. Patient feels that overall, he was doing much better and considers his progress a miracle. Patient was cordial and engaged. Patient engaged in life review by sharing stories about himself and family. Patient, his wife and sitters were going [place] home [place] a few weeks. His wife stated that she and patient went to the grocery store and even out to eat by themselves. His wife stated that since patient is able ambulate better with his walker, she feels more comfortable taking him out. Patient appeared to be in good spirits. 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 SW provide supportive presence, active and reflective listening, assessment of needs and comfort of patient, observation and reassurance of support.  3. PATIENT/CAREGIVER EDUCATION/ COPING: Patient's daughter remain supportive. They have a sitter several hours a day.   4. PERSONAL EMERGENCY PLAN:  911 Can be activated  for emergencies.  5. COMMUNITY RESOURCES COORDINATION/ HEALTH CARE NAVIGATION: Patient has a private sitter-Nicole. He attends the [facility] daily to exercise. They have supportive friends, neighbors and extended family members.  6. FINANCIAL/LEGAL CONCERNS/INTERVENTIONS: No financial or legal issues presented.      SOCIAL HX:  Social History   Tobacco Use  . Smoking status: Never Smoker  . Smokeless tobacco: Never Used  Substance Use Topics  . Alcohol use: No    Alcohol/week: 0.0 standard drinks    CODE STATUS: DNR ADVANCED DIRECTIVES: No MOST FORM COMPLETE: No HOSPICE EDUCATION PROVIDED: No  PPS: Patient is ambulating with his walker more, but will also utilize the wheelchair. He is alert and oriented to self and situation. He continues to repeat himself.  Duration of visit and documentation: 45 minutes.      [address] [place], [place]

## 2020-06-14 DIAGNOSIS — E538 Deficiency of other specified B group vitamins: Secondary | ICD-10-CM | POA: Diagnosis not present

## 2020-06-22 DIAGNOSIS — I10 Essential (primary) hypertension: Secondary | ICD-10-CM | POA: Diagnosis not present

## 2020-06-22 DIAGNOSIS — J309 Allergic rhinitis, unspecified: Secondary | ICD-10-CM | POA: Diagnosis not present

## 2020-06-22 DIAGNOSIS — R05 Cough: Secondary | ICD-10-CM | POA: Diagnosis not present

## 2020-06-22 DIAGNOSIS — R350 Frequency of micturition: Secondary | ICD-10-CM | POA: Diagnosis not present

## 2020-06-28 DIAGNOSIS — M25412 Effusion, left shoulder: Secondary | ICD-10-CM | POA: Diagnosis not present

## 2020-06-28 DIAGNOSIS — M25812 Other specified joint disorders, left shoulder: Secondary | ICD-10-CM | POA: Diagnosis not present

## 2020-07-05 DIAGNOSIS — M25512 Pain in left shoulder: Secondary | ICD-10-CM | POA: Diagnosis not present

## 2020-07-05 DIAGNOSIS — M109 Gout, unspecified: Secondary | ICD-10-CM | POA: Diagnosis not present

## 2020-07-05 DIAGNOSIS — E538 Deficiency of other specified B group vitamins: Secondary | ICD-10-CM | POA: Diagnosis not present

## 2020-07-05 DIAGNOSIS — M25412 Effusion, left shoulder: Secondary | ICD-10-CM | POA: Diagnosis not present

## 2020-07-10 DIAGNOSIS — M25512 Pain in left shoulder: Secondary | ICD-10-CM | POA: Diagnosis not present

## 2020-07-18 DIAGNOSIS — E538 Deficiency of other specified B group vitamins: Secondary | ICD-10-CM | POA: Diagnosis not present

## 2020-07-27 DIAGNOSIS — Z23 Encounter for immunization: Secondary | ICD-10-CM | POA: Diagnosis not present

## 2020-08-23 DIAGNOSIS — R319 Hematuria, unspecified: Secondary | ICD-10-CM | POA: Diagnosis not present

## 2020-08-23 DIAGNOSIS — E538 Deficiency of other specified B group vitamins: Secondary | ICD-10-CM | POA: Diagnosis not present

## 2020-08-23 DIAGNOSIS — R82998 Other abnormal findings in urine: Secondary | ICD-10-CM | POA: Diagnosis not present

## 2020-09-04 ENCOUNTER — Other Ambulatory Visit: Payer: Self-pay | Admitting: Psychiatry

## 2020-09-05 NOTE — Telephone Encounter (Signed)
No apt but looked like he was having pallative care in May  No visit since 12/2018

## 2020-09-13 DIAGNOSIS — Z23 Encounter for immunization: Secondary | ICD-10-CM | POA: Diagnosis not present

## 2020-09-28 DIAGNOSIS — H2513 Age-related nuclear cataract, bilateral: Secondary | ICD-10-CM | POA: Diagnosis not present

## 2020-09-28 DIAGNOSIS — H5213 Myopia, bilateral: Secondary | ICD-10-CM | POA: Diagnosis not present

## 2020-10-11 DIAGNOSIS — E538 Deficiency of other specified B group vitamins: Secondary | ICD-10-CM | POA: Diagnosis not present

## 2020-10-23 DIAGNOSIS — R300 Dysuria: Secondary | ICD-10-CM | POA: Diagnosis not present

## 2020-11-20 DIAGNOSIS — E538 Deficiency of other specified B group vitamins: Secondary | ICD-10-CM | POA: Diagnosis not present

## 2020-12-26 DIAGNOSIS — E538 Deficiency of other specified B group vitamins: Secondary | ICD-10-CM | POA: Diagnosis not present

## 2021-01-03 DIAGNOSIS — N390 Urinary tract infection, site not specified: Secondary | ICD-10-CM | POA: Diagnosis not present

## 2021-01-03 DIAGNOSIS — R319 Hematuria, unspecified: Secondary | ICD-10-CM | POA: Diagnosis not present

## 2021-01-03 DIAGNOSIS — Z0000 Encounter for general adult medical examination without abnormal findings: Secondary | ICD-10-CM | POA: Diagnosis not present

## 2021-01-08 DIAGNOSIS — D649 Anemia, unspecified: Secondary | ICD-10-CM | POA: Diagnosis not present

## 2021-01-08 DIAGNOSIS — I480 Paroxysmal atrial fibrillation: Secondary | ICD-10-CM | POA: Diagnosis not present

## 2021-01-08 DIAGNOSIS — M1380 Other specified arthritis, unspecified site: Secondary | ICD-10-CM | POA: Diagnosis not present

## 2021-01-08 DIAGNOSIS — N400 Enlarged prostate without lower urinary tract symptoms: Secondary | ICD-10-CM | POA: Diagnosis not present

## 2021-01-08 DIAGNOSIS — R7301 Impaired fasting glucose: Secondary | ICD-10-CM | POA: Diagnosis not present

## 2021-01-08 DIAGNOSIS — E785 Hyperlipidemia, unspecified: Secondary | ICD-10-CM | POA: Diagnosis not present

## 2021-01-08 DIAGNOSIS — N182 Chronic kidney disease, stage 2 (mild): Secondary | ICD-10-CM | POA: Diagnosis not present

## 2021-01-08 DIAGNOSIS — M109 Gout, unspecified: Secondary | ICD-10-CM | POA: Diagnosis not present

## 2021-01-08 DIAGNOSIS — I1310 Hypertensive heart and chronic kidney disease without heart failure, with stage 1 through stage 4 chronic kidney disease, or unspecified chronic kidney disease: Secondary | ICD-10-CM | POA: Diagnosis not present

## 2021-01-08 DIAGNOSIS — I10 Essential (primary) hypertension: Secondary | ICD-10-CM | POA: Diagnosis not present

## 2021-01-08 DIAGNOSIS — K5900 Constipation, unspecified: Secondary | ICD-10-CM | POA: Diagnosis not present

## 2021-01-16 DIAGNOSIS — L602 Onychogryphosis: Secondary | ICD-10-CM | POA: Diagnosis not present

## 2021-02-08 DIAGNOSIS — E785 Hyperlipidemia, unspecified: Secondary | ICD-10-CM | POA: Diagnosis not present

## 2021-02-08 DIAGNOSIS — I1310 Hypertensive heart and chronic kidney disease without heart failure, with stage 1 through stage 4 chronic kidney disease, or unspecified chronic kidney disease: Secondary | ICD-10-CM | POA: Diagnosis not present

## 2021-02-08 DIAGNOSIS — N182 Chronic kidney disease, stage 2 (mild): Secondary | ICD-10-CM | POA: Diagnosis not present

## 2021-02-14 DIAGNOSIS — E538 Deficiency of other specified B group vitamins: Secondary | ICD-10-CM | POA: Diagnosis not present

## 2021-02-21 DIAGNOSIS — E538 Deficiency of other specified B group vitamins: Secondary | ICD-10-CM | POA: Diagnosis not present

## 2021-02-22 DIAGNOSIS — M109 Gout, unspecified: Secondary | ICD-10-CM | POA: Diagnosis not present

## 2021-02-22 DIAGNOSIS — E785 Hyperlipidemia, unspecified: Secondary | ICD-10-CM | POA: Diagnosis not present

## 2021-02-28 DIAGNOSIS — R82998 Other abnormal findings in urine: Secondary | ICD-10-CM | POA: Diagnosis not present

## 2021-02-28 DIAGNOSIS — E538 Deficiency of other specified B group vitamins: Secondary | ICD-10-CM | POA: Diagnosis not present

## 2021-02-28 DIAGNOSIS — G309 Alzheimer's disease, unspecified: Secondary | ICD-10-CM | POA: Diagnosis not present

## 2021-02-28 DIAGNOSIS — F0390 Unspecified dementia without behavioral disturbance: Secondary | ICD-10-CM | POA: Diagnosis not present

## 2021-02-28 DIAGNOSIS — F319 Bipolar disorder, unspecified: Secondary | ICD-10-CM | POA: Diagnosis not present

## 2021-02-28 DIAGNOSIS — D6869 Other thrombophilia: Secondary | ICD-10-CM | POA: Diagnosis not present

## 2021-02-28 DIAGNOSIS — N400 Enlarged prostate without lower urinary tract symptoms: Secondary | ICD-10-CM | POA: Diagnosis not present

## 2021-02-28 DIAGNOSIS — Z0000 Encounter for general adult medical examination without abnormal findings: Secondary | ICD-10-CM | POA: Diagnosis not present

## 2021-02-28 DIAGNOSIS — Z1339 Encounter for screening examination for other mental health and behavioral disorders: Secondary | ICD-10-CM | POA: Diagnosis not present

## 2021-02-28 DIAGNOSIS — Z1331 Encounter for screening for depression: Secondary | ICD-10-CM | POA: Diagnosis not present

## 2021-02-28 DIAGNOSIS — I480 Paroxysmal atrial fibrillation: Secondary | ICD-10-CM | POA: Diagnosis not present

## 2021-02-28 DIAGNOSIS — N1831 Chronic kidney disease, stage 3a: Secondary | ICD-10-CM | POA: Diagnosis not present

## 2021-02-28 DIAGNOSIS — E785 Hyperlipidemia, unspecified: Secondary | ICD-10-CM | POA: Diagnosis not present

## 2021-02-28 DIAGNOSIS — R7301 Impaired fasting glucose: Secondary | ICD-10-CM | POA: Diagnosis not present

## 2021-02-28 DIAGNOSIS — I1310 Hypertensive heart and chronic kidney disease without heart failure, with stage 1 through stage 4 chronic kidney disease, or unspecified chronic kidney disease: Secondary | ICD-10-CM | POA: Diagnosis not present

## 2021-03-01 DIAGNOSIS — Z1212 Encounter for screening for malignant neoplasm of rectum: Secondary | ICD-10-CM | POA: Diagnosis not present

## 2021-03-04 ENCOUNTER — Other Ambulatory Visit: Payer: Self-pay | Admitting: Psychiatry

## 2021-03-13 DIAGNOSIS — N390 Urinary tract infection, site not specified: Secondary | ICD-10-CM | POA: Diagnosis not present

## 2021-03-20 DIAGNOSIS — E538 Deficiency of other specified B group vitamins: Secondary | ICD-10-CM | POA: Diagnosis not present

## 2021-03-26 ENCOUNTER — Encounter: Payer: Self-pay | Admitting: Gastroenterology

## 2021-04-10 DIAGNOSIS — N182 Chronic kidney disease, stage 2 (mild): Secondary | ICD-10-CM | POA: Diagnosis not present

## 2021-04-10 DIAGNOSIS — E785 Hyperlipidemia, unspecified: Secondary | ICD-10-CM | POA: Diagnosis not present

## 2021-04-10 DIAGNOSIS — I1310 Hypertensive heart and chronic kidney disease without heart failure, with stage 1 through stage 4 chronic kidney disease, or unspecified chronic kidney disease: Secondary | ICD-10-CM | POA: Diagnosis not present

## 2021-04-10 DIAGNOSIS — I480 Paroxysmal atrial fibrillation: Secondary | ICD-10-CM | POA: Diagnosis not present

## 2021-04-12 DIAGNOSIS — Z23 Encounter for immunization: Secondary | ICD-10-CM | POA: Diagnosis not present

## 2021-04-24 DIAGNOSIS — I1310 Hypertensive heart and chronic kidney disease without heart failure, with stage 1 through stage 4 chronic kidney disease, or unspecified chronic kidney disease: Secondary | ICD-10-CM | POA: Diagnosis not present

## 2021-04-25 DIAGNOSIS — E538 Deficiency of other specified B group vitamins: Secondary | ICD-10-CM | POA: Diagnosis not present

## 2021-04-27 DIAGNOSIS — R2689 Other abnormalities of gait and mobility: Secondary | ICD-10-CM | POA: Diagnosis not present

## 2021-04-27 DIAGNOSIS — Z1152 Encounter for screening for COVID-19: Secondary | ICD-10-CM | POA: Diagnosis not present

## 2021-04-27 DIAGNOSIS — N1831 Chronic kidney disease, stage 3a: Secondary | ICD-10-CM | POA: Diagnosis not present

## 2021-04-27 DIAGNOSIS — R059 Cough, unspecified: Secondary | ICD-10-CM | POA: Diagnosis not present

## 2021-04-27 DIAGNOSIS — K5900 Constipation, unspecified: Secondary | ICD-10-CM | POA: Diagnosis not present

## 2021-04-27 DIAGNOSIS — I480 Paroxysmal atrial fibrillation: Secondary | ICD-10-CM | POA: Diagnosis not present

## 2021-04-27 DIAGNOSIS — N390 Urinary tract infection, site not specified: Secondary | ICD-10-CM | POA: Diagnosis not present

## 2021-04-27 DIAGNOSIS — I1310 Hypertensive heart and chronic kidney disease without heart failure, with stage 1 through stage 4 chronic kidney disease, or unspecified chronic kidney disease: Secondary | ICD-10-CM | POA: Diagnosis not present

## 2021-04-27 DIAGNOSIS — D72829 Elevated white blood cell count, unspecified: Secondary | ICD-10-CM | POA: Diagnosis not present

## 2021-05-10 DIAGNOSIS — N182 Chronic kidney disease, stage 2 (mild): Secondary | ICD-10-CM | POA: Diagnosis not present

## 2021-05-10 DIAGNOSIS — I1310 Hypertensive heart and chronic kidney disease without heart failure, with stage 1 through stage 4 chronic kidney disease, or unspecified chronic kidney disease: Secondary | ICD-10-CM | POA: Diagnosis not present

## 2021-05-10 DIAGNOSIS — E785 Hyperlipidemia, unspecified: Secondary | ICD-10-CM | POA: Diagnosis not present

## 2021-05-10 DIAGNOSIS — I480 Paroxysmal atrial fibrillation: Secondary | ICD-10-CM | POA: Diagnosis not present

## 2021-05-17 DIAGNOSIS — E538 Deficiency of other specified B group vitamins: Secondary | ICD-10-CM | POA: Diagnosis not present

## 2021-06-01 DIAGNOSIS — I480 Paroxysmal atrial fibrillation: Secondary | ICD-10-CM | POA: Diagnosis not present

## 2021-06-01 DIAGNOSIS — N1831 Chronic kidney disease, stage 3a: Secondary | ICD-10-CM | POA: Diagnosis not present

## 2021-06-01 DIAGNOSIS — R0981 Nasal congestion: Secondary | ICD-10-CM | POA: Diagnosis not present

## 2021-06-01 DIAGNOSIS — Z1152 Encounter for screening for COVID-19: Secondary | ICD-10-CM | POA: Diagnosis not present

## 2021-06-01 DIAGNOSIS — R062 Wheezing: Secondary | ICD-10-CM | POA: Diagnosis not present

## 2021-06-01 DIAGNOSIS — J069 Acute upper respiratory infection, unspecified: Secondary | ICD-10-CM | POA: Diagnosis not present

## 2021-06-01 DIAGNOSIS — R051 Acute cough: Secondary | ICD-10-CM | POA: Diagnosis not present

## 2021-06-01 DIAGNOSIS — I1310 Hypertensive heart and chronic kidney disease without heart failure, with stage 1 through stage 4 chronic kidney disease, or unspecified chronic kidney disease: Secondary | ICD-10-CM | POA: Diagnosis not present

## 2021-06-08 DIAGNOSIS — Z20822 Contact with and (suspected) exposure to covid-19: Secondary | ICD-10-CM | POA: Diagnosis not present

## 2021-06-27 DIAGNOSIS — E538 Deficiency of other specified B group vitamins: Secondary | ICD-10-CM | POA: Diagnosis not present

## 2021-07-11 DIAGNOSIS — I1310 Hypertensive heart and chronic kidney disease without heart failure, with stage 1 through stage 4 chronic kidney disease, or unspecified chronic kidney disease: Secondary | ICD-10-CM | POA: Diagnosis not present

## 2021-07-11 DIAGNOSIS — E785 Hyperlipidemia, unspecified: Secondary | ICD-10-CM | POA: Diagnosis not present

## 2021-07-11 DIAGNOSIS — I480 Paroxysmal atrial fibrillation: Secondary | ICD-10-CM | POA: Diagnosis not present

## 2021-07-11 DIAGNOSIS — N182 Chronic kidney disease, stage 2 (mild): Secondary | ICD-10-CM | POA: Diagnosis not present

## 2021-08-04 DIAGNOSIS — R34 Anuria and oliguria: Secondary | ICD-10-CM | POA: Diagnosis not present

## 2021-08-04 DIAGNOSIS — R319 Hematuria, unspecified: Secondary | ICD-10-CM | POA: Diagnosis not present

## 2021-08-30 DIAGNOSIS — E538 Deficiency of other specified B group vitamins: Secondary | ICD-10-CM | POA: Diagnosis not present

## 2021-09-02 ENCOUNTER — Encounter (HOSPITAL_COMMUNITY): Payer: Self-pay | Admitting: *Deleted

## 2021-09-02 ENCOUNTER — Emergency Department (HOSPITAL_COMMUNITY): Payer: Medicare Other

## 2021-09-02 ENCOUNTER — Emergency Department (HOSPITAL_COMMUNITY)
Admission: EM | Admit: 2021-09-02 | Discharge: 2021-09-02 | Disposition: A | Discharge status: Home / Self Care | Payer: Medicare Other | Attending: Emergency Medicine | Admitting: Emergency Medicine

## 2021-09-02 ENCOUNTER — Other Ambulatory Visit: Payer: Self-pay

## 2021-09-02 DIAGNOSIS — Y92002 Bathroom of unspecified non-institutional (private) residence single-family (private) house as the place of occurrence of the external cause: Secondary | ICD-10-CM | POA: Diagnosis not present

## 2021-09-02 DIAGNOSIS — Z7901 Long term (current) use of anticoagulants: Secondary | ICD-10-CM | POA: Diagnosis not present

## 2021-09-02 DIAGNOSIS — R55 Syncope and collapse: Secondary | ICD-10-CM | POA: Diagnosis not present

## 2021-09-02 DIAGNOSIS — F0390 Unspecified dementia without behavioral disturbance: Secondary | ICD-10-CM | POA: Diagnosis not present

## 2021-09-02 DIAGNOSIS — N3091 Cystitis, unspecified with hematuria: Secondary | ICD-10-CM | POA: Diagnosis not present

## 2021-09-02 DIAGNOSIS — W19XXXA Unspecified fall, initial encounter: Secondary | ICD-10-CM

## 2021-09-02 DIAGNOSIS — M2578 Osteophyte, vertebrae: Secondary | ICD-10-CM | POA: Diagnosis not present

## 2021-09-02 DIAGNOSIS — S0291XA Unspecified fracture of skull, initial encounter for closed fracture: Secondary | ICD-10-CM | POA: Diagnosis not present

## 2021-09-02 DIAGNOSIS — G319 Degenerative disease of nervous system, unspecified: Secondary | ICD-10-CM | POA: Diagnosis not present

## 2021-09-02 DIAGNOSIS — S0990XA Unspecified injury of head, initial encounter: Secondary | ICD-10-CM | POA: Insufficient documentation

## 2021-09-02 DIAGNOSIS — W01198A Fall on same level from slipping, tripping and stumbling with subsequent striking against other object, initial encounter: Secondary | ICD-10-CM | POA: Diagnosis not present

## 2021-09-02 DIAGNOSIS — I5032 Chronic diastolic (congestive) heart failure: Secondary | ICD-10-CM | POA: Diagnosis not present

## 2021-09-02 DIAGNOSIS — I959 Hypotension, unspecified: Secondary | ICD-10-CM | POA: Diagnosis not present

## 2021-09-02 DIAGNOSIS — R231 Pallor: Secondary | ICD-10-CM | POA: Diagnosis not present

## 2021-09-02 DIAGNOSIS — I4891 Unspecified atrial fibrillation: Secondary | ICD-10-CM | POA: Diagnosis not present

## 2021-09-02 DIAGNOSIS — Z043 Encounter for examination and observation following other accident: Secondary | ICD-10-CM | POA: Diagnosis not present

## 2021-09-02 DIAGNOSIS — I517 Cardiomegaly: Secondary | ICD-10-CM | POA: Diagnosis not present

## 2021-09-02 DIAGNOSIS — M47812 Spondylosis without myelopathy or radiculopathy, cervical region: Secondary | ICD-10-CM | POA: Diagnosis not present

## 2021-09-02 DIAGNOSIS — Z79899 Other long term (current) drug therapy: Secondary | ICD-10-CM | POA: Insufficient documentation

## 2021-09-02 DIAGNOSIS — I110 Hypertensive heart disease with heart failure: Secondary | ICD-10-CM | POA: Insufficient documentation

## 2021-09-02 DIAGNOSIS — S199XXA Unspecified injury of neck, initial encounter: Secondary | ICD-10-CM | POA: Diagnosis not present

## 2021-09-02 DIAGNOSIS — I6782 Cerebral ischemia: Secondary | ICD-10-CM | POA: Diagnosis not present

## 2021-09-02 DIAGNOSIS — Z20822 Contact with and (suspected) exposure to covid-19: Secondary | ICD-10-CM | POA: Insufficient documentation

## 2021-09-02 DIAGNOSIS — R000 Tachycardia, unspecified: Secondary | ICD-10-CM | POA: Diagnosis not present

## 2021-09-02 LAB — PROTIME-INR
INR: 1.3 — ABNORMAL HIGH (ref 0.8–1.2)
Prothrombin Time: 16.3 seconds — ABNORMAL HIGH (ref 11.4–15.2)

## 2021-09-02 LAB — URINALYSIS, ROUTINE W REFLEX MICROSCOPIC
Bacteria, UA: NONE SEEN
Bilirubin Urine: NEGATIVE
Glucose, UA: NEGATIVE mg/dL
Ketones, ur: NEGATIVE mg/dL
Nitrite: NEGATIVE
Protein, ur: 100 mg/dL — AB
RBC / HPF: >50 RBC/hpf — ABNORMAL HIGH (ref 0–5)
Specific Gravity, Urine: 1.013 (ref 1.005–1.030)
WBC, UA: >50 WBC/hpf — ABNORMAL HIGH (ref 0–5)
pH: 6 (ref 5.0–8.0)

## 2021-09-02 LAB — CBC
HCT: 41.7 % (ref 39.0–52.0)
Hemoglobin: 13.2 g/dL (ref 13.0–17.0)
MCH: 29.1 pg (ref 26.0–34.0)
MCHC: 31.7 g/dL (ref 30.0–36.0)
MCV: 92.1 fL (ref 80.0–100.0)
Platelets: 261 10*3/uL (ref 150–400)
RBC: 4.53 MIL/uL (ref 4.22–5.81)
RDW: 14.4 % (ref 11.5–15.5)
WBC: 11.8 10*3/uL — ABNORMAL HIGH (ref 4.0–10.5)
nRBC: 0 % (ref 0.0–0.2)

## 2021-09-02 LAB — COMPREHENSIVE METABOLIC PANEL
ALT: 9 U/L (ref 0–44)
AST: 14 U/L — ABNORMAL LOW (ref 15–41)
Albumin: 2.9 g/dL — ABNORMAL LOW (ref 3.5–5.0)
Alkaline Phosphatase: 74 U/L (ref 38–126)
Anion gap: 6 (ref 5–15)
BUN: 29 mg/dL — ABNORMAL HIGH (ref 8–23)
CO2: 23 mmol/L (ref 22–32)
Calcium: 9 mg/dL (ref 8.9–10.3)
Chloride: 108 mmol/L (ref 98–111)
Creatinine, Ser: 1.4 mg/dL — ABNORMAL HIGH (ref 0.61–1.24)
GFR, Estimated: 52 mL/min — ABNORMAL LOW (ref >60)
Glucose, Bld: 121 mg/dL — ABNORMAL HIGH (ref 70–99)
Potassium: 4.1 mmol/L (ref 3.5–5.1)
Sodium: 137 mmol/L (ref 135–145)
Total Bilirubin: 0.9 mg/dL (ref 0.3–1.2)
Total Protein: 7.4 g/dL (ref 6.5–8.1)

## 2021-09-02 LAB — RESP PANEL BY RT-PCR (FLU A&B, COVID) ARPGX2
Influenza A by PCR: NEGATIVE
Influenza B by PCR: NEGATIVE
SARS Coronavirus 2 by RT PCR: NEGATIVE

## 2021-09-02 LAB — ETHANOL: Alcohol, Ethyl (B): <10 mg/dL (ref <10)

## 2021-09-02 LAB — LACTIC ACID, PLASMA: Lactic Acid, Venous: 1.6 mmol/L (ref 0.5–1.9)

## 2021-09-02 LAB — SAMPLE TO BLOOD BANK

## 2021-09-02 LAB — LITHIUM LEVEL: Lithium Lvl: 0.25 mmol/L — ABNORMAL LOW (ref 0.60–1.20)

## 2021-09-02 MED ORDER — SODIUM CHLORIDE 0.9 % IV SOLN: INTRAVENOUS | Status: DC

## 2021-09-02 MED ORDER — SODIUM CHLORIDE 0.9 % IV SOLN
1.0000 g | Freq: Once | INTRAVENOUS | Status: AC
  Administered 2021-09-02: 1 g via INTRAVENOUS
  Filled 2021-09-02: qty 10

## 2021-09-02 MED ORDER — SODIUM CHLORIDE 0.9 % IV BOLUS
1000.0000 mL | Freq: Once | INTRAVENOUS | Status: AC
  Administered 2021-09-02: 1000 mL via INTRAVENOUS

## 2021-09-02 MED ORDER — CEPHALEXIN 500 MG PO CAPS: 500.0000 mg | ORAL_CAPSULE | Freq: Four times a day (QID) | ORAL | 0 refills | Status: AC

## 2021-09-02 NOTE — ED Triage Notes (Signed)
Arrives as level 2 trauma for fall, hit head on eliquis. [place], witnessed in b/r at home, wife present, hit wall prior to floor. No obvious wounds. H/o dementia, currently at baseline. Afib, h/o afib. No obvious trauma. C-collar in place. Usually ambulates with walker. NSL 20g in L hand. BP 130/70. 98.5 axillary temp, 95% RA, Cbg 207.

## 2021-09-02 NOTE — ED Notes (Signed)
Back from CT labs and xrays.

## 2021-09-02 NOTE — Progress Notes (Signed)
   09/02/21 1326  Clinical Encounter Type  Visited With Patient not available  Visit Type Initial  Referral From Nurse  Consult/Referral To Chaplain   Chaplain responded to Level 2 trauma. Pt being treated and no support person present. No current spiritual care needs. Chaplain remains available.  This note was prepared by Marijo File, MDiv. Chaplain remains available as needed through the on-call pager: [phone].

## 2021-09-02 NOTE — ED Notes (Signed)
Pt remains alert, NAD, calm, interactive, resps e/u, speaking in clear complete phrases. Abd soft NT, LS CTA. MAEx4. Delay in xray at [facility]. Wife present.

## 2021-09-02 NOTE — ED Notes (Signed)
Xray at [facility]. Labs sent. Wife at [facility]. Warm IVF hung.

## 2021-09-02 NOTE — ED Notes (Signed)
Pt discharged and wheeled out of the ED in a wheel chair without difficulty. 

## 2021-09-02 NOTE — ED Provider Notes (Signed)
[facility] EMERGENCY DEPARTMENT Provider Note   CSN: [id number] Arrival date & time: 09/02/21  1350     History Chief Complaint  Patient presents with   Russell Dillon is a 75 y.o. male.  Pt presents to the ED today with a fall.  Pt has dementia and is from home.  EMS gives the hx.  Pt's wife was helping him out of the shower and he fell back and hit his head.  He is on Eliquis for a hx of afib.  Pt's daughter told EMS that he's been a little more confused than normal.  He gets frequent UTIs which have caused confusion in the past.  Pt denies any pain.  No obvious trauma.  Per EMS, his bp was in the 90s.  He was given 100 cc NS en route.  BP normal now, but HR is elevated.  It is unkn if he took his morning meds.      Past Medical History:  Diagnosis Date   Acute metabolic encephalopathy 2/94/7654   Aortic valve regurgitation 10/16/2018   Echo 07/28/2017:  EF 60-65, moderate AI, aortic root and ascending aorta mildly dilated (40 mm), ascending aorta 43 mm, MAC, mild MR, mild LAE, moderate RV enlargement, trivial TR // Echo 12/19: EF 60-65, normal wall motion, mild AI, moderate BAE, ascending aorta 43 mm, aortic root 39 mm   BPH (benign prostatic hyperplasia)    BPH (benign prostatic hypertrophy)    Chronic anticoagulation    Chronic atrial fibrillation (HCC)    Diastolic dysfunction October 2010   Normal LV systolic function   Hx SBO    Hyperlipidemia    Hypertension    Patellar tendon rupture, left, initial encounter 02/10/2019   Severe sepsis ([place]) 05/05/2019   Small bowel obstruction ([place])     Patient Active Problem List   Diagnosis Date Noted   Bacteremia due to Enterococcus 05/24/2019   Urinary tract infection 05/24/2019   Chronic diastolic CHF (congestive heart failure) ([place]) 05/22/2019   Gout 05/22/2019   Sepsis ([place]) 05/22/2019   Protein-calorie malnutrition, severe 05/18/2019   AKI (acute kidney injury) ([place]) 05/05/2019   Transient  hypotension 05/05/2019   Sepsis, Gram negative ([place]) 03/18/2019   Hypoalbuminemia    Leukocytosis    Hyperglycemia    Persistent atrial fibrillation with RVR (HCC)    Acute on chronic anemia    Rupture of left patellar tendon 02/15/2019   Postoperative pain    Bipolar 1 disorder ([place]) 02/14/2019   Dementia without behavioral disturbance ([place]) 02/14/2019   Patellar tendon rupture, left, initial encounter 02/10/2019   SIRS (systemic inflammatory response syndrome) ([place]) 02/09/2019   Osteomyelitis ([place]) 02/09/2019   Acute blood loss anemia 02/09/2019   Aortic valve regurgitation 10/16/2018   SBO (small bowel obstruction) ([place]) 07/08/2013   Hyperlipidemia 12/20/2011   HTN (hypertension) 06/04/2011   Atrial fibrillation, chronic ([place]) 03/01/2011   Long term (current) use of anticoagulants 03/01/2011   GERD 10/11/2009   DIVERTICULOSIS OF COLON 10/11/2009    Past Surgical History:  Procedure Laterality Date   APPENDECTOMY  6503   APPLICATION OF WOUND VAC Left 02/11/2019   Procedure: Application Of Wound Vac;  Surgeon: Altamese [place], MD;  Location: [place];  Service: Orthopedics;  Laterality: Left;   CARDIOVASCULAR STRESS TEST  09/30/2007   EF 67%  No ischemia.    CARDIOVERSION  05/01/2004   KNEE ARTHROSCOPY Left 02/11/2019   Procedure: ARTHROSCOPY LEFT KNEE;  Surgeon: Marcelino Scot,  Legrand Como, MD;  Location: [place];  Service: Orthopedics;  Laterality: Left;   LAPAROTOMY N/A 05/11/2019   Procedure: EXPLORATORY LAPAROTOMY;  Surgeon: Rolm Bookbinder, MD;  Location: [place];  Service: General;  Laterality: N/A;   LYSIS OF ADHESION N/A 05/11/2019   Procedure: LYSIS OF ADHESION;  Surgeon: Rolm Bookbinder, MD;  Location: [place];  Service: General;  Laterality: N/A;   PATELLAR TENDON REPAIR Left 02/11/2019   Procedure: LEFT PATELLA TENDON REPAIR;  Surgeon: Altamese [place], MD;  Location: [place];  Service: Orthopedics;  Laterality: Left;   SMALL INTESTINE SURGERY  2004   US ECHOCARDIOGRAPHY  09/05/2009   ef  55-60%       Family History  Problem Relation Age of Onset   Heart attack Mother    Hypertension Mother    Diabetes Father    Heart disease Father    Colon cancer Neg Hx    Esophageal cancer Neg Hx    Pancreatic cancer Neg Hx    Prostate cancer Neg Hx    Rectal cancer Neg Hx    Stomach cancer Neg Hx     Social History   Tobacco Use   Smoking status: Never   Smokeless tobacco: Never  Vaping Use   Vaping Use: Never used  Substance Use Topics   Alcohol use: No    Alcohol/week: 0.0 standard drinks   Drug use: No    Home Medications Prior to Admission medications   Medication Sig Start Date End Date Taking? Authorizing Provider  cephALEXin (KEFLEX) 500 MG capsule Take 1 capsule (500 mg total) by mouth 4 (four) times daily. 09/02/21  Yes Isla Pence, MD  acetaminophen (TYLENOL) 325 MG tablet Take 2 tablets (650 mg total) by mouth every 6 (six) hours as needed for mild pain, fever or headache. Patient not taking: Reported on 06/03/2019 03/02/19   Angiulli, Lavon Paganini, PA-C  allopurinol (ZYLOPRIM) 100 MG tablet Take 1 tablet (100 mg total) by mouth daily. Patient taking differently: Take 100 mg by mouth at bedtime.  03/26/19   Black, Lezlie Octave, NP  Cholecalciferol (VITAMIN D-3) 25 MCG (1000 UT) CAPS Take 1,000 Units by mouth 2 (two) times a day.    [provider]  colchicine 0.6 MG tablet Take 1 tablet (0.6 mg total) by mouth 2 (two) times daily. Patient taking differently: Take 0.6 mg by mouth daily.  03/25/19   Dhungel, Nishant, MD  cyanocobalamin (,VITAMIN B-12,) 1000 MCG/ML injection Inject 1,000 mcg into the muscle every 30 (thirty) days.    [provider]  diltiazem (CARDIZEM CD) 120 MG 24 hr capsule Take 1 capsule (120 mg total) by mouth daily. 03/25/19 03/24/20  Black, Lezlie Octave, NP  docusate sodium (COLACE) 100 MG capsule Take 1 capsule (100 mg total) by mouth 2 (two) times daily. Patient taking differently: Take 100 mg by mouth at bedtime.  05/20/19    Shelly Coss, MD  donepezil (ARICEPT) 10 MG tablet Take 1 tablet (10 mg total) by mouth daily. Patient taking differently: Take 10 mg by mouth at bedtime.  03/02/19   Angiulli, Lavon Paganini, PA-C  feeding supplement, ENSURE ENLIVE, (ENSURE ENLIVE) LIQD Take 237 mLs by mouth 2 (two) times daily between meals. Patient taking differently: Take 118.5-237 mLs by mouth 2 (two) times daily as needed (for supplementation). CHOCOLATE 03/22/19   Regalado, Belkys A, MD  finasteride (PROSCAR) 5 MG tablet Take 1 tablet (5 mg total) by mouth every morning. 03/02/19   Angiulli, Lavon Paganini, PA-C  lithium carbonate  150 MG capsule TAKE 1 CAPSULE BY MOUTH EVERY NIGHT AT BEDTIME. APPOINTMENT NEEDED 03/07/21   Cottle, Billey Co., MD  Multiple Vitamins-Minerals (ICAPS AREDS 2 PO) Take 1 capsule by mouth daily.     [provider]  omega-3 fish oil (MAXEPA) 1000 MG CAPS capsule Take 1,000 mg by mouth daily with breakfast.     [provider]  warfarin (COUMADIN) 2.5 MG tablet Take 1 tablet (2.5 mg total) by mouth daily. Patient taking differently: Take 2.5 mg by mouth daily at 6 PM.  03/02/19   Angiulli, Lavon Paganini, PA-C    Allergies    Patient has no known allergies.  Review of Systems   Review of Systems  Unable to perform ROS: Dementia   Physical Exam Updated Vital Signs BP 122/77   Pulse 80   Temp (!) 97.2 F (36.2 C) (Axillary)   Resp (!) 21   Ht _0  (1.727 m)   Wt 102.1 kg   SpO2 96%   BMI 34.21 kg/m   Physical Exam Vitals and nursing note reviewed.  Constitutional:      Appearance: Normal appearance.  HENT:     Head: Normocephalic and atraumatic.     Right Ear: External ear normal.     Left Ear: External ear normal.     Nose: Nose normal.     Mouth/Throat:     Mouth: Mucous membranes are moist.     Pharynx: Oropharynx is clear.  Eyes:     Extraocular Movements: Extraocular movements intact.     Conjunctiva/sclera: Conjunctivae normal.     Pupils: Pupils are equal, round,  and reactive to light.  Neck:     Comments: In c-collar Cardiovascular:     Rate and Rhythm: Tachycardia present. Rhythm irregular.     Pulses: Normal pulses.     Heart sounds: Normal heart sounds.  Pulmonary:     Effort: Pulmonary effort is normal.     Breath sounds: Normal breath sounds.  Abdominal:     General: Abdomen is flat. Bowel sounds are normal.     Palpations: Abdomen is soft.  Musculoskeletal:        General: Normal range of motion.  Skin:    General: Skin is warm.     Capillary Refill: Capillary refill takes less than 2 seconds.  Neurological:     General: No focal deficit present.     Mental Status: He is alert. Mental status is at baseline.  Psychiatric:        Mood and Affect: Mood normal.        Behavior: Behavior normal.    ED Results / Procedures / Treatments   Labs (all labs ordered are listed, but only abnormal results are displayed) Labs Reviewed  COMPREHENSIVE METABOLIC PANEL - Abnormal; Notable for the following components:      Result Value   Glucose, Bld 121 (*)    BUN 29 (*)    Creatinine, Ser 1.40 (*)    Albumin 2.9 (*)    AST 14 (*)    GFR, Estimated 52 (*)    All other components within normal limits  CBC - Abnormal; Notable for the following components:   WBC 11.8 (*)    All other components within normal limits  URINALYSIS, ROUTINE W REFLEX MICROSCOPIC - Abnormal; Notable for the following components:   Color, Urine RED (*)    APPearance TURBID (*)    Hgb urine dipstick LARGE (*)    Protein, ur 100 (*)  Leukocytes,Ua LARGE (*)    RBC / HPF >50 (*)    WBC, UA >50 (*)    All other components within normal limits  PROTIME-INR - Abnormal; Notable for the following components:   Prothrombin Time 16.3 (*)    INR 1.3 (*)    All other components within normal limits  LITHIUM LEVEL - Abnormal; Notable for the following components:   Lithium Lvl 0.25 (*)    All other components within normal limits  RESP PANEL BY RT-PCR (FLU A&B,  COVID) ARPGX2  URINE CULTURE  ETHANOL  LACTIC ACID, PLASMA  SAMPLE TO BLOOD BANK    EKG EKG Interpretation  Date/Time:  Sunday September 02 2021 14:10:41 EDT Ventricular Rate:  134 PR Interval:    QRS Duration: 82 QT Interval:  305 QTC Calculation: 456 R Axis:   96 Text Interpretation: Atrial fibrillation Right axis deviation Abnormal R-wave progression, early transition Minimal ST depression No significant change since last tracing Confirmed by Isla Pence [phone]) on 09/02/2021 2:36:33 PM  Radiology CT HEAD WO CONTRAST  Result Date: 09/02/2021 CLINICAL DATA:  A 75 year old male presents with fall while on blood thinners with head trauma. EXAM: CT HEAD WITHOUT CONTRAST CT CERVICAL SPINE WITHOUT CONTRAST TECHNIQUE: Multidetector CT imaging of the head and cervical spine was performed following the standard protocol without intravenous contrast. Multiplanar CT image reconstructions of the cervical spine were also generated. COMPARISON:  CT of the head from May 23, 2019. FINDINGS: CT HEAD FINDINGS Brain: No evidence of acute infarction, hemorrhage, hydrocephalus, extra-axial collection or mass lesion/mass effect. Signs of cerebral atrophy and chronic microvascular ischemic change similar to previous imaging. Vascular: No hyperdense vessel or unexpected calcification. Skull: Normal. Negative for fracture or focal lesion. Sinuses/Orbits: Sinuses are clear. No air-fluid level. No acute process about the orbits. Other: None. CT CERVICAL SPINE FINDINGS Alignment: Normal. Skull base and vertebrae: No acute fracture. No primary bone lesion or focal pathologic process. Soft tissues and spinal canal: Choose 1 Disc levels: Multilevel spinal degenerative change with disc space narrowing and uncovertebral spurring as well as facet arthropathy. Degenerative changes greatest at C5-6 and C6-7 and associated with near complete loss of the disc space at these levels. Anterior osteophytes also throughout the  cervical spine. Progressive degenerative changes since more remote imaging. Most recent comparison available is from 2009, CT of the neck. Upper chest: Negative. Other: None. IMPRESSION: No acute intracranial abnormality. Signs of cerebral atrophy and chronic microvascular ischemic change similar to previous imaging. No evidence for acute fracture or subluxation of the cervical spine. Multilevel degenerative changes throughout the cervical spine with worsening signs of remote imaging as discussed. Electronically Signed   By: Zetta Bills M.D.   On: 09/02/2021 15:04   CT CERVICAL SPINE WO CONTRAST  Result Date: 09/02/2021 CLINICAL DATA:  A 75 year old male presents with fall while on blood thinners with head trauma. EXAM: CT HEAD WITHOUT CONTRAST CT CERVICAL SPINE WITHOUT CONTRAST TECHNIQUE: Multidetector CT imaging of the head and cervical spine was performed following the standard protocol without intravenous contrast. Multiplanar CT image reconstructions of the cervical spine were also generated. COMPARISON:  CT of the head from May 23, 2019. FINDINGS: CT HEAD FINDINGS Brain: No evidence of acute infarction, hemorrhage, hydrocephalus, extra-axial collection or mass lesion/mass effect. Signs of cerebral atrophy and chronic microvascular ischemic change similar to previous imaging. Vascular: No hyperdense vessel or unexpected calcification. Skull: Normal. Negative for fracture or focal lesion. Sinuses/Orbits: Sinuses are clear. No air-fluid level. No acute process  about the orbits. Other: None. CT CERVICAL SPINE FINDINGS Alignment: Normal. Skull base and vertebrae: No acute fracture. No primary bone lesion or focal pathologic process. Soft tissues and spinal canal: Choose 1 Disc levels: Multilevel spinal degenerative change with disc space narrowing and uncovertebral spurring as well as facet arthropathy. Degenerative changes greatest at C5-6 and C6-7 and associated with near complete loss of the disc space  at these levels. Anterior osteophytes also throughout the cervical spine. Progressive degenerative changes since more remote imaging. Most recent comparison available is from 2009, CT of the neck. Upper chest: Negative. Other: None. IMPRESSION: No acute intracranial abnormality. Signs of cerebral atrophy and chronic microvascular ischemic change similar to previous imaging. No evidence for acute fracture or subluxation of the cervical spine. Multilevel degenerative changes throughout the cervical spine with worsening signs of remote imaging as discussed. Electronically Signed   By: Zetta Bills M.D.   On: 09/02/2021 15:04   DG Pelvis Portable  Result Date: 09/02/2021 CLINICAL DATA:  Fall EXAM: PORTABLE PELVIS 1-2 VIEWS COMPARISON:  None. FINDINGS: There is no evidence of pelvic fracture or diastasis this single view. No pelvic bone lesions are seen. Limited assessment of the sacrum secondary to overlying bowel gas. IMPRESSION: No acute fracture or dislocation on this single view. If concern for hip or pelvic fracture, recommend dedicated additional views. Electronically Signed   By: Valentino Saxon M.D.   On: 09/02/2021 15:20   DG Chest Port 1 View  Result Date: 09/02/2021 CLINICAL DATA:  Fall EXAM: PORTABLE CHEST 1 VIEW COMPARISON:  May 22, 2019 FINDINGS: Evaluation is limited by patient rotation. The cardiomediastinal silhouette is enlarged in contour; this is unchanged in comparison to prior when accounting for patient rotation. No pleural effusion. No pneumothorax. No acute pleuroparenchymal abnormality. Visualized abdomen is unremarkable. IMPRESSION: No acute cardiopulmonary abnormality. Electronically Signed   By: Valentino Saxon M.D.   On: 09/02/2021 15:19    Procedures Procedures   Medications Ordered in ED Medications  sodium chloride 0.9 % bolus 1,000 mL (0 mLs Intravenous Stopped 09/02/21 1600)    And  0.9 %  sodium chloride infusion ( Intravenous New Bag/Given 09/02/21 1610)   cefTRIAXone (ROCEPHIN) 1 g in sodium chloride 0.9 % 100 mL IVPB (has no administration in time range)    ED Course  I have reviewed the triage vital signs and the nursing notes.  Pertinent labs & imaging results that were available during my care of the patient were reviewed by me and considered in my medical decision making (see chart for details).    MDM Rules/Calculators/A&P                           CHA2DS2/VAS Stroke Risk Points  Current as of a minute ago     4 >= 2 Points: High Risk  1 - 1.99 Points: Medium Risk  0 Points: Low Risk    Last Change: N/A      Details    This score determines the patient's risk of having a stroke if the  patient has atrial fibrillation.       Points Metrics  1 Has Congestive Heart Failure:  Yes    Current as of a minute ago  0 Has Vascular Disease:  No    Current as of a minute ago  1 Has Hypertension:  Yes    Current as of a minute ago  2 Age:  40    Current as  of a minute ago  0 Has Diabetes:  No    Current as of a minute ago  0 Had Stroke:  No  Had TIA:  No  Had Thromboembolism:  No    Current as of a minute ago  0 Male:  No    Current as of a minute ago     Pt has no evidence of traumatic injury.  HR initially elevated has come down after rest.  Urine shows UTI.  Urine sent for culture.  Pt given a dose of IV rocephin and will be d/c with keflex.  Pt is stable for d/c.  Return if worse.  F/u with pcp.      Final Clinical Impression(s) / ED Diagnoses Final diagnoses:  Fall  Hemorrhagic cystitis  On apixaban therapy  Atrial fibrillation, unspecified type ([place])    Rx / DC Orders ED Discharge Orders          Ordered    cephALEXin (KEFLEX) 500 MG capsule  4 times daily        09/02/21 1834             Isla Pence, MD 09/02/21 [phone]

## 2021-09-05 LAB — URINE CULTURE: Culture: >=100000 — AB

## 2021-09-12 DIAGNOSIS — F319 Bipolar disorder, unspecified: Secondary | ICD-10-CM | POA: Diagnosis not present

## 2021-09-12 DIAGNOSIS — M6281 Muscle weakness (generalized): Secondary | ICD-10-CM | POA: Diagnosis not present

## 2021-09-12 DIAGNOSIS — F03918 Unspecified dementia, unspecified severity, with other behavioral disturbance: Secondary | ICD-10-CM | POA: Diagnosis not present

## 2021-09-12 DIAGNOSIS — R269 Unspecified abnormalities of gait and mobility: Secondary | ICD-10-CM | POA: Diagnosis not present

## 2021-09-12 DIAGNOSIS — K5900 Constipation, unspecified: Secondary | ICD-10-CM | POA: Diagnosis not present

## 2021-09-19 ENCOUNTER — Telehealth: Payer: Self-pay

## 2021-09-19 NOTE — Telephone Encounter (Signed)
Spoke with patient's wife [name] and scheduled an in-person Palliative Consult for 10/10/21 @ 1:00 PM with Dr. Hollace Kinnier. Documentation will be noted in [place].   COVID screening was negative. No pets in home. Patient lives with wife.  Consent obtained; updated Outlook/Netsmart/Team List and Epic.   Family is aware they may be receiving a call from provider the day before or day of to confirm appointment.

## 2021-09-29 ENCOUNTER — Other Ambulatory Visit: Payer: Self-pay | Admitting: Psychiatry

## 2021-10-09 DIAGNOSIS — N39498 Other specified urinary incontinence: Secondary | ICD-10-CM | POA: Diagnosis not present

## 2021-10-09 DIAGNOSIS — F319 Bipolar disorder, unspecified: Secondary | ICD-10-CM | POA: Diagnosis not present

## 2021-10-09 DIAGNOSIS — E785 Hyperlipidemia, unspecified: Secondary | ICD-10-CM | POA: Diagnosis not present

## 2021-10-09 DIAGNOSIS — M1990 Unspecified osteoarthritis, unspecified site: Secondary | ICD-10-CM | POA: Diagnosis not present

## 2021-10-09 DIAGNOSIS — G309 Alzheimer's disease, unspecified: Secondary | ICD-10-CM | POA: Diagnosis not present

## 2021-10-09 DIAGNOSIS — I480 Paroxysmal atrial fibrillation: Secondary | ICD-10-CM | POA: Diagnosis not present

## 2021-10-09 DIAGNOSIS — Z87440 Personal history of urinary (tract) infections: Secondary | ICD-10-CM | POA: Diagnosis not present

## 2021-10-09 DIAGNOSIS — F02811 Dementia in other diseases classified elsewhere, unspecified severity, with agitation: Secondary | ICD-10-CM | POA: Diagnosis not present

## 2021-10-09 DIAGNOSIS — I10 Essential (primary) hypertension: Secondary | ICD-10-CM | POA: Diagnosis not present

## 2021-10-09 DIAGNOSIS — K5900 Constipation, unspecified: Secondary | ICD-10-CM | POA: Diagnosis not present

## 2021-10-09 DIAGNOSIS — Z9181 History of falling: Secondary | ICD-10-CM | POA: Diagnosis not present

## 2021-10-09 DIAGNOSIS — Z7901 Long term (current) use of anticoagulants: Secondary | ICD-10-CM | POA: Diagnosis not present

## 2021-10-09 DIAGNOSIS — M25562 Pain in left knee: Secondary | ICD-10-CM | POA: Diagnosis not present

## 2021-10-09 DIAGNOSIS — N401 Enlarged prostate with lower urinary tract symptoms: Secondary | ICD-10-CM | POA: Diagnosis not present

## 2021-10-10 DIAGNOSIS — R4181 Age-related cognitive decline: Secondary | ICD-10-CM | POA: Diagnosis not present

## 2021-10-10 DIAGNOSIS — R634 Abnormal weight loss: Secondary | ICD-10-CM | POA: Diagnosis not present

## 2021-10-10 DIAGNOSIS — Z515 Encounter for palliative care: Secondary | ICD-10-CM | POA: Diagnosis not present

## 2021-10-10 DIAGNOSIS — D519 Vitamin B12 deficiency anemia, unspecified: Secondary | ICD-10-CM | POA: Diagnosis not present

## 2021-10-10 DIAGNOSIS — F0150 Vascular dementia without behavioral disturbance: Secondary | ICD-10-CM | POA: Diagnosis not present

## 2021-10-11 DIAGNOSIS — K5900 Constipation, unspecified: Secondary | ICD-10-CM | POA: Diagnosis not present

## 2021-10-11 DIAGNOSIS — M25562 Pain in left knee: Secondary | ICD-10-CM | POA: Diagnosis not present

## 2021-10-11 DIAGNOSIS — I10 Essential (primary) hypertension: Secondary | ICD-10-CM | POA: Diagnosis not present

## 2021-10-11 DIAGNOSIS — F319 Bipolar disorder, unspecified: Secondary | ICD-10-CM | POA: Diagnosis not present

## 2021-10-11 DIAGNOSIS — G309 Alzheimer's disease, unspecified: Secondary | ICD-10-CM | POA: Diagnosis not present

## 2021-10-11 DIAGNOSIS — F02811 Dementia in other diseases classified elsewhere, unspecified severity, with agitation: Secondary | ICD-10-CM | POA: Diagnosis not present

## 2021-10-16 DIAGNOSIS — M25562 Pain in left knee: Secondary | ICD-10-CM | POA: Diagnosis not present

## 2021-10-16 DIAGNOSIS — I10 Essential (primary) hypertension: Secondary | ICD-10-CM | POA: Diagnosis not present

## 2021-10-16 DIAGNOSIS — F02811 Dementia in other diseases classified elsewhere, unspecified severity, with agitation: Secondary | ICD-10-CM | POA: Diagnosis not present

## 2021-10-16 DIAGNOSIS — K5900 Constipation, unspecified: Secondary | ICD-10-CM | POA: Diagnosis not present

## 2021-10-16 DIAGNOSIS — F319 Bipolar disorder, unspecified: Secondary | ICD-10-CM | POA: Diagnosis not present

## 2021-10-16 DIAGNOSIS — G309 Alzheimer's disease, unspecified: Secondary | ICD-10-CM | POA: Diagnosis not present

## 2021-10-18 DIAGNOSIS — I10 Essential (primary) hypertension: Secondary | ICD-10-CM | POA: Diagnosis not present

## 2021-10-18 DIAGNOSIS — F02811 Dementia in other diseases classified elsewhere, unspecified severity, with agitation: Secondary | ICD-10-CM | POA: Diagnosis not present

## 2021-10-18 DIAGNOSIS — K5900 Constipation, unspecified: Secondary | ICD-10-CM | POA: Diagnosis not present

## 2021-10-18 DIAGNOSIS — F319 Bipolar disorder, unspecified: Secondary | ICD-10-CM | POA: Diagnosis not present

## 2021-10-18 DIAGNOSIS — M25562 Pain in left knee: Secondary | ICD-10-CM | POA: Diagnosis not present

## 2021-10-18 DIAGNOSIS — G309 Alzheimer's disease, unspecified: Secondary | ICD-10-CM | POA: Diagnosis not present

## 2021-10-19 DIAGNOSIS — G309 Alzheimer's disease, unspecified: Secondary | ICD-10-CM | POA: Diagnosis not present

## 2021-10-19 DIAGNOSIS — I10 Essential (primary) hypertension: Secondary | ICD-10-CM | POA: Diagnosis not present

## 2021-10-19 DIAGNOSIS — F02811 Dementia in other diseases classified elsewhere, unspecified severity, with agitation: Secondary | ICD-10-CM | POA: Diagnosis not present

## 2021-10-19 DIAGNOSIS — F319 Bipolar disorder, unspecified: Secondary | ICD-10-CM | POA: Diagnosis not present

## 2021-10-19 DIAGNOSIS — M25562 Pain in left knee: Secondary | ICD-10-CM | POA: Diagnosis not present

## 2021-10-19 DIAGNOSIS — K5900 Constipation, unspecified: Secondary | ICD-10-CM | POA: Diagnosis not present

## 2021-10-22 DIAGNOSIS — F319 Bipolar disorder, unspecified: Secondary | ICD-10-CM | POA: Diagnosis not present

## 2021-10-22 DIAGNOSIS — G309 Alzheimer's disease, unspecified: Secondary | ICD-10-CM | POA: Diagnosis not present

## 2021-10-22 DIAGNOSIS — F02811 Dementia in other diseases classified elsewhere, unspecified severity, with agitation: Secondary | ICD-10-CM | POA: Diagnosis not present

## 2021-10-22 DIAGNOSIS — M25562 Pain in left knee: Secondary | ICD-10-CM | POA: Diagnosis not present

## 2021-10-22 DIAGNOSIS — K5900 Constipation, unspecified: Secondary | ICD-10-CM | POA: Diagnosis not present

## 2021-10-22 DIAGNOSIS — I10 Essential (primary) hypertension: Secondary | ICD-10-CM | POA: Diagnosis not present

## 2021-10-23 DIAGNOSIS — M25562 Pain in left knee: Secondary | ICD-10-CM | POA: Diagnosis not present

## 2021-10-23 DIAGNOSIS — F319 Bipolar disorder, unspecified: Secondary | ICD-10-CM | POA: Diagnosis not present

## 2021-10-23 DIAGNOSIS — G309 Alzheimer's disease, unspecified: Secondary | ICD-10-CM | POA: Diagnosis not present

## 2021-10-23 DIAGNOSIS — K5900 Constipation, unspecified: Secondary | ICD-10-CM | POA: Diagnosis not present

## 2021-10-23 DIAGNOSIS — F02811 Dementia in other diseases classified elsewhere, unspecified severity, with agitation: Secondary | ICD-10-CM | POA: Diagnosis not present

## 2021-10-23 DIAGNOSIS — I10 Essential (primary) hypertension: Secondary | ICD-10-CM | POA: Diagnosis not present

## 2021-10-25 DIAGNOSIS — M25562 Pain in left knee: Secondary | ICD-10-CM | POA: Diagnosis not present

## 2021-10-25 DIAGNOSIS — F02811 Dementia in other diseases classified elsewhere, unspecified severity, with agitation: Secondary | ICD-10-CM | POA: Diagnosis not present

## 2021-10-25 DIAGNOSIS — G309 Alzheimer's disease, unspecified: Secondary | ICD-10-CM | POA: Diagnosis not present

## 2021-10-25 DIAGNOSIS — K5900 Constipation, unspecified: Secondary | ICD-10-CM | POA: Diagnosis not present

## 2021-10-25 DIAGNOSIS — F319 Bipolar disorder, unspecified: Secondary | ICD-10-CM | POA: Diagnosis not present

## 2021-10-25 DIAGNOSIS — I10 Essential (primary) hypertension: Secondary | ICD-10-CM | POA: Diagnosis not present

## 2021-10-30 DIAGNOSIS — F319 Bipolar disorder, unspecified: Secondary | ICD-10-CM | POA: Diagnosis not present

## 2021-10-30 DIAGNOSIS — F02811 Dementia in other diseases classified elsewhere, unspecified severity, with agitation: Secondary | ICD-10-CM | POA: Diagnosis not present

## 2021-10-30 DIAGNOSIS — I10 Essential (primary) hypertension: Secondary | ICD-10-CM | POA: Diagnosis not present

## 2021-10-30 DIAGNOSIS — G309 Alzheimer's disease, unspecified: Secondary | ICD-10-CM | POA: Diagnosis not present

## 2021-10-30 DIAGNOSIS — K5900 Constipation, unspecified: Secondary | ICD-10-CM | POA: Diagnosis not present

## 2021-10-30 DIAGNOSIS — M25562 Pain in left knee: Secondary | ICD-10-CM | POA: Diagnosis not present

## 2021-11-01 DIAGNOSIS — K5900 Constipation, unspecified: Secondary | ICD-10-CM | POA: Diagnosis not present

## 2021-11-01 DIAGNOSIS — M25562 Pain in left knee: Secondary | ICD-10-CM | POA: Diagnosis not present

## 2021-11-01 DIAGNOSIS — F319 Bipolar disorder, unspecified: Secondary | ICD-10-CM | POA: Diagnosis not present

## 2021-11-01 DIAGNOSIS — F02811 Dementia in other diseases classified elsewhere, unspecified severity, with agitation: Secondary | ICD-10-CM | POA: Diagnosis not present

## 2021-11-01 DIAGNOSIS — G309 Alzheimer's disease, unspecified: Secondary | ICD-10-CM | POA: Diagnosis not present

## 2021-11-01 DIAGNOSIS — I10 Essential (primary) hypertension: Secondary | ICD-10-CM | POA: Diagnosis not present

## 2021-11-06 DIAGNOSIS — F319 Bipolar disorder, unspecified: Secondary | ICD-10-CM | POA: Diagnosis not present

## 2021-11-06 DIAGNOSIS — F02811 Dementia in other diseases classified elsewhere, unspecified severity, with agitation: Secondary | ICD-10-CM | POA: Diagnosis not present

## 2021-11-06 DIAGNOSIS — K5900 Constipation, unspecified: Secondary | ICD-10-CM | POA: Diagnosis not present

## 2021-11-06 DIAGNOSIS — G309 Alzheimer's disease, unspecified: Secondary | ICD-10-CM | POA: Diagnosis not present

## 2021-11-06 DIAGNOSIS — M25562 Pain in left knee: Secondary | ICD-10-CM | POA: Diagnosis not present

## 2021-11-06 DIAGNOSIS — I10 Essential (primary) hypertension: Secondary | ICD-10-CM | POA: Diagnosis not present

## 2021-11-08 DIAGNOSIS — Z87440 Personal history of urinary (tract) infections: Secondary | ICD-10-CM | POA: Diagnosis not present

## 2021-11-08 DIAGNOSIS — K5900 Constipation, unspecified: Secondary | ICD-10-CM | POA: Diagnosis not present

## 2021-11-08 DIAGNOSIS — Z7901 Long term (current) use of anticoagulants: Secondary | ICD-10-CM | POA: Diagnosis not present

## 2021-11-08 DIAGNOSIS — F02811 Dementia in other diseases classified elsewhere, unspecified severity, with agitation: Secondary | ICD-10-CM | POA: Diagnosis not present

## 2021-11-08 DIAGNOSIS — I480 Paroxysmal atrial fibrillation: Secondary | ICD-10-CM | POA: Diagnosis not present

## 2021-11-08 DIAGNOSIS — N39498 Other specified urinary incontinence: Secondary | ICD-10-CM | POA: Diagnosis not present

## 2021-11-08 DIAGNOSIS — F319 Bipolar disorder, unspecified: Secondary | ICD-10-CM | POA: Diagnosis not present

## 2021-11-08 DIAGNOSIS — M1990 Unspecified osteoarthritis, unspecified site: Secondary | ICD-10-CM | POA: Diagnosis not present

## 2021-11-08 DIAGNOSIS — N401 Enlarged prostate with lower urinary tract symptoms: Secondary | ICD-10-CM | POA: Diagnosis not present

## 2021-11-08 DIAGNOSIS — E785 Hyperlipidemia, unspecified: Secondary | ICD-10-CM | POA: Diagnosis not present

## 2021-11-08 DIAGNOSIS — Z9181 History of falling: Secondary | ICD-10-CM | POA: Diagnosis not present

## 2021-11-08 DIAGNOSIS — G309 Alzheimer's disease, unspecified: Secondary | ICD-10-CM | POA: Diagnosis not present

## 2021-11-08 DIAGNOSIS — I10 Essential (primary) hypertension: Secondary | ICD-10-CM | POA: Diagnosis not present

## 2021-11-08 DIAGNOSIS — M25562 Pain in left knee: Secondary | ICD-10-CM | POA: Diagnosis not present

## 2021-11-13 DIAGNOSIS — M25562 Pain in left knee: Secondary | ICD-10-CM | POA: Diagnosis not present

## 2021-11-13 DIAGNOSIS — F319 Bipolar disorder, unspecified: Secondary | ICD-10-CM | POA: Diagnosis not present

## 2021-11-13 DIAGNOSIS — K5900 Constipation, unspecified: Secondary | ICD-10-CM | POA: Diagnosis not present

## 2021-11-13 DIAGNOSIS — I10 Essential (primary) hypertension: Secondary | ICD-10-CM | POA: Diagnosis not present

## 2021-11-13 DIAGNOSIS — F02811 Dementia in other diseases classified elsewhere, unspecified severity, with agitation: Secondary | ICD-10-CM | POA: Diagnosis not present

## 2021-11-13 DIAGNOSIS — G309 Alzheimer's disease, unspecified: Secondary | ICD-10-CM | POA: Diagnosis not present

## 2021-11-15 DIAGNOSIS — F02811 Dementia in other diseases classified elsewhere, unspecified severity, with agitation: Secondary | ICD-10-CM | POA: Diagnosis not present

## 2021-11-15 DIAGNOSIS — F319 Bipolar disorder, unspecified: Secondary | ICD-10-CM | POA: Diagnosis not present

## 2021-11-15 DIAGNOSIS — G309 Alzheimer's disease, unspecified: Secondary | ICD-10-CM | POA: Diagnosis not present

## 2021-11-15 DIAGNOSIS — I10 Essential (primary) hypertension: Secondary | ICD-10-CM | POA: Diagnosis not present

## 2021-11-15 DIAGNOSIS — M25562 Pain in left knee: Secondary | ICD-10-CM | POA: Diagnosis not present

## 2021-11-15 DIAGNOSIS — K5900 Constipation, unspecified: Secondary | ICD-10-CM | POA: Diagnosis not present

## 2021-11-19 DIAGNOSIS — F319 Bipolar disorder, unspecified: Secondary | ICD-10-CM | POA: Diagnosis not present

## 2021-11-19 DIAGNOSIS — G309 Alzheimer's disease, unspecified: Secondary | ICD-10-CM | POA: Diagnosis not present

## 2021-11-19 DIAGNOSIS — F02811 Dementia in other diseases classified elsewhere, unspecified severity, with agitation: Secondary | ICD-10-CM | POA: Diagnosis not present

## 2021-11-19 DIAGNOSIS — I10 Essential (primary) hypertension: Secondary | ICD-10-CM | POA: Diagnosis not present

## 2021-11-19 DIAGNOSIS — M25562 Pain in left knee: Secondary | ICD-10-CM | POA: Diagnosis not present

## 2021-11-19 DIAGNOSIS — K5900 Constipation, unspecified: Secondary | ICD-10-CM | POA: Diagnosis not present

## 2021-11-22 DIAGNOSIS — G309 Alzheimer's disease, unspecified: Secondary | ICD-10-CM | POA: Diagnosis not present

## 2021-11-22 DIAGNOSIS — I10 Essential (primary) hypertension: Secondary | ICD-10-CM | POA: Diagnosis not present

## 2021-11-22 DIAGNOSIS — K5900 Constipation, unspecified: Secondary | ICD-10-CM | POA: Diagnosis not present

## 2021-11-22 DIAGNOSIS — M25562 Pain in left knee: Secondary | ICD-10-CM | POA: Diagnosis not present

## 2021-11-22 DIAGNOSIS — F02811 Dementia in other diseases classified elsewhere, unspecified severity, with agitation: Secondary | ICD-10-CM | POA: Diagnosis not present

## 2021-11-22 DIAGNOSIS — F319 Bipolar disorder, unspecified: Secondary | ICD-10-CM | POA: Diagnosis not present

## 2021-11-27 DIAGNOSIS — K5900 Constipation, unspecified: Secondary | ICD-10-CM | POA: Diagnosis not present

## 2021-11-27 DIAGNOSIS — M25562 Pain in left knee: Secondary | ICD-10-CM | POA: Diagnosis not present

## 2021-11-27 DIAGNOSIS — I10 Essential (primary) hypertension: Secondary | ICD-10-CM | POA: Diagnosis not present

## 2021-11-27 DIAGNOSIS — F02811 Dementia in other diseases classified elsewhere, unspecified severity, with agitation: Secondary | ICD-10-CM | POA: Diagnosis not present

## 2021-11-27 DIAGNOSIS — F319 Bipolar disorder, unspecified: Secondary | ICD-10-CM | POA: Diagnosis not present

## 2021-11-27 DIAGNOSIS — G309 Alzheimer's disease, unspecified: Secondary | ICD-10-CM | POA: Diagnosis not present

## 2021-12-05 DIAGNOSIS — G309 Alzheimer's disease, unspecified: Secondary | ICD-10-CM | POA: Diagnosis not present

## 2021-12-05 DIAGNOSIS — I10 Essential (primary) hypertension: Secondary | ICD-10-CM | POA: Diagnosis not present

## 2021-12-05 DIAGNOSIS — F02811 Dementia in other diseases classified elsewhere, unspecified severity, with agitation: Secondary | ICD-10-CM | POA: Diagnosis not present

## 2021-12-05 DIAGNOSIS — K5900 Constipation, unspecified: Secondary | ICD-10-CM | POA: Diagnosis not present

## 2021-12-05 DIAGNOSIS — F319 Bipolar disorder, unspecified: Secondary | ICD-10-CM | POA: Diagnosis not present

## 2021-12-05 DIAGNOSIS — M25562 Pain in left knee: Secondary | ICD-10-CM | POA: Diagnosis not present

## 2021-12-06 ENCOUNTER — Encounter: Payer: Self-pay | Admitting: Internal Medicine

## 2021-12-06 ENCOUNTER — Other Ambulatory Visit: Payer: Self-pay

## 2021-12-06 ENCOUNTER — Other Ambulatory Visit: Payer: Medicare Other | Admitting: Internal Medicine

## 2021-12-06 DIAGNOSIS — I10 Essential (primary) hypertension: Secondary | ICD-10-CM | POA: Diagnosis not present

## 2021-12-06 DIAGNOSIS — F02811 Dementia in other diseases classified elsewhere, unspecified severity, with agitation: Secondary | ICD-10-CM | POA: Diagnosis not present

## 2021-12-06 DIAGNOSIS — F01C3 Vascular dementia, severe, with mood disturbance: Secondary | ICD-10-CM | POA: Diagnosis not present

## 2021-12-06 DIAGNOSIS — R634 Abnormal weight loss: Secondary | ICD-10-CM | POA: Diagnosis not present

## 2021-12-06 DIAGNOSIS — M25562 Pain in left knee: Secondary | ICD-10-CM | POA: Diagnosis not present

## 2021-12-06 DIAGNOSIS — F319 Bipolar disorder, unspecified: Secondary | ICD-10-CM | POA: Diagnosis not present

## 2021-12-06 DIAGNOSIS — Z515 Encounter for palliative care: Secondary | ICD-10-CM

## 2021-12-06 DIAGNOSIS — E538 Deficiency of other specified B group vitamins: Secondary | ICD-10-CM | POA: Diagnosis not present

## 2021-12-06 DIAGNOSIS — K5900 Constipation, unspecified: Secondary | ICD-10-CM | POA: Diagnosis not present

## 2021-12-06 DIAGNOSIS — Z741 Need for assistance with personal care: Secondary | ICD-10-CM

## 2021-12-06 DIAGNOSIS — G309 Alzheimer's disease, unspecified: Secondary | ICD-10-CM | POA: Diagnosis not present

## 2021-12-06 NOTE — Progress Notes (Signed)
° ° °[facility] °Community Palliative Care Follow-Up Visit °Telephone: [phone]  °Fax: [phone]  ° °Date of encounter: 12/06/21 °6:30 AM °PATIENT NAME: Russell Dillon °[address] °[place] [address]   °[phone] (home)  °DOB: 10/10/1946 °MRN: [id number] °PRIMARY CARE PROVIDER:    °Shaw, William D Jr., MD,  °[address] °[place] [address] °[phone] ° °REFERRING PROVIDER:   °Shaw, William D Jr., MD °[address] °[place],  [place] [address] [phone] ° °RESPONSIBLE PARTY:    °Contact Information   ° ° Name Relation Home Work Mobile  ° Russell Dillon Spouse [phone]  [phone]  ° MOORFIELD-LANG,Russell Daughter   [phone]  ° °  ° ° ° °I met face to face with patient and family in their home. Palliative Care was asked to follow this patient by consultation request of  Shaw, William D Jr., MD to address advance care planning and complex medical decision making. This is follow-up visit.  ° ° °                                 ASSESSMENT AND PLAN / RECOMMENDATIONS:  ° °Advance Care Planning/Goals of Care: Goals include to maximize quality of life and symptom management. Patient/health care surrogate gave his/her permission to discuss.Our advance care planning conversation included a discussion about:    °The value and importance of advance care planning  °Experiences with loved ones who have been seriously ill or have died  °Exploration of personal, cultural or spiritual beliefs that might influence medical decisions  °Exploration of goals of care in the event of a sudden injury or illness  °Identification  of a healthcare agent  He has a living will and HCPOA with wife [name] (has some memory loss) and their daughter, Russell Dillon.   °Review and updating or creation of an  advance directive document . °Decision not to resuscitate or to de-escalate disease focused treatments due to poor prognosis.  they'd like an attempt at CPR but no  prolonged ventilator support. °CODE STATUS:  FULL CODE; they were not ready to discuss the MOST form today. ° °Symptom Management/Plan: °Vascular dementia (F01.50):  continues to progress; pt only able to sit up for a few minutes on the side of the bed, fully ADL dependent, eats well; goals are not consistent with hospice care and he continues to eat well and take supplements well though he's bedbound suggesting prognosis may exceed 6 mos if usual course of illness is followed; may be less in event of a stroke, aspiration or new pressure injuries, infections ° °Weight loss (R63.4):  continues on supplement shakes and assistance from caregivers and his wife at meals; no recent weights due to bedbound and homebound status ° °Functional decline (R41.81):  as above, adl dependent; cont caregivers, wife's support and daughter and son-in-law's assistance getting meds, supplies and food for parents ° °B12 deficiency (D51.9):  recommended they get b12 in form of preloaded syringes so that [name] could administer them to Russell Dillon at home (she's given herself injections before and feels comfortable with that part but would like to avoid drawing up the medication), alternative is RN from home health as we don't have the staffing to do this for a patient monthly ° °Palliative care encounter (Z51.5):  reviewed goals of care--stay at home, but still may go to hospital for some illnesses and even want trial of CPR; will continue to discuss and provide education   education over time   Follow up Palliative Care Visit: Palliative care will continue to follow for complex medical decision making, advance care planning, and clarification of goals. Return 8 weeks or prn.  This visit was coded based on medical decision making (MDM).  PPS: 30%  HOSPICE ELIGIBILITY/DIAGNOSIS: Yes but goals not consistent/cerebrovascular disease  Chief Complaint: Follow-up palliative visit  HISTORY OF PRESENT ILLNESS:  Russell Dillon is a 76 y.o. year old  male  with dementia with behavioral disturbance, bipolar disorder, chronic diastolic chf, afib, aortic regurgitation, anemia, gout among others seen in palliative care follow-up.  I initially saw him at the request of Dr. Brigitte Dillon about 6 weeks ago to discuss ACP, goals of care and for caregiver support.  That note is in [facility] EMR.   When seen today, he was unchanged.  He continues to be dependent in adls.  Intake is fairly good most days and he accepts supplement shakes for added nutrition.  He has a low air loss alternating pressure mattress with cushion.  He is still getting OT to sit on the side of the bed, but his postural stability is quite poor.  [name] denies aspiration episodes.  They continue to have regular caregiver hours.  Russell Dillon continues to bring in groceries and DME supplies like depends and chucks for her dad.  He is in a hospital bed.    History obtained from review of EMR, discussion with primary team, and interview with family, facility staff/caregiver and/or Russell Dillon.  I reviewed available labs, medications, imaging, studies and related documents from the EMR.  Records reviewed and summarized above.   ROS  General: NAD EYES: denies vision changes, uses glasses ENMT: denies dysphagia Cardiovascular: denies chest pain, denies DOE Pulmonary: denies cough, denies increased SOB Abdomen: endorses fairly good appetite though he's lost weight by report, denies constipation with use of miralax, endorses incontinence of bowel GU: denies dysuria, endorses incontinence of urine MSK:  has increased weakness,  no falls reported Skin: denies rashes or wounds Neurological: denies pain, denies insomnia Psych: Mood unchanged--has flat affect and abrupt responses Heme/lymph/immuno: denies bruises, abnormal bleeding  Physical Exam: Current and past weights:  last weight in epic is 225 in October 2022 Constitutional: NAD General: chronically-ill appearing,obese, lying in bed  EYES:  anicteric sclera, lids intact, no discharge  ENMT: intact hearing, oral mucous membranes moist, dentition intact CV: S1S2,irreg irreg, no LE edema Pulmonary: LCTA, no increased work of breathing, no cough, room air Abdomen: intake 75%, normo-active BS + 4 quadrants, soft and non tender, no ascites GU: deferred MSK: has sarcopenia, moves all extremities, only able to sit on side of bed, hoyer needed for transfers but does not get up Skin: warm and dry, no rashes or wounds on visible skin Neuro:  has generalized weakness,  has cognitive impairment Psych: non-anxious affect, A and O x to familiar faces, knows he's at home Hem/lymph/immuno: no widespread bruising  CURRENT PROBLEM LIST:  Patient Active Problem List   Diagnosis Date Noted   Bacteremia due to Enterococcus 05/24/2019   Urinary tract infection 05/24/2019   Chronic diastolic CHF (congestive heart failure) ([facility]) 05/22/2019   Gout 05/22/2019   Sepsis ([facility]) 05/22/2019   Protein-calorie malnutrition, severe 05/18/2019   AKI (acute kidney injury) ([facility]) 05/05/2019   Transient hypotension 05/05/2019   Sepsis, Gram negative ([facility]) 03/18/2019   Hypoalbuminemia    Leukocytosis    Hyperglycemia    Persistent atrial fibrillation with RVR (HCC)    Acute on chronic  Rupture of left patellar tendon 02/15/2019  ° Postoperative pain   ° Bipolar 1 disorder (HCC) 02/14/2019  ° Dementia without behavioral disturbance (HCC) 02/14/2019  ° Patellar tendon rupture, left, initial encounter 02/10/2019  ° SIRS (systemic inflammatory response syndrome) (HCC) 02/09/2019  ° Osteomyelitis (HCC) 02/09/2019  ° Acute blood loss anemia 02/09/2019  ° Aortic valve regurgitation 10/16/2018  ° SBO (small bowel obstruction) (HCC) 07/08/2013  ° Hyperlipidemia 12/20/2011  ° HTN (hypertension) 06/04/2011  ° Atrial fibrillation, chronic (HCC) 03/01/2011  ° Long term (current) use of anticoagulants 03/01/2011  ° GERD 10/11/2009  ° DIVERTICULOSIS OF COLON 10/11/2009   ° °PAST MEDICAL HISTORY:  °Active Ambulatory Problems  °  Diagnosis Date Noted  ° GERD 10/11/2009  ° DIVERTICULOSIS OF COLON 10/11/2009  ° Atrial fibrillation, chronic (HCC) 03/01/2011  ° Long term (current) use of anticoagulants 03/01/2011  ° HTN (hypertension) 06/04/2011  ° Hyperlipidemia 12/20/2011  ° SBO (small bowel obstruction) (HCC) 07/08/2013  ° Aortic valve regurgitation 10/16/2018  ° SIRS (systemic inflammatory response syndrome) (HCC) 02/09/2019  ° Osteomyelitis (HCC) 02/09/2019  ° Acute blood loss anemia 02/09/2019  ° Patellar tendon rupture, left, initial encounter 02/10/2019  ° Bipolar 1 disorder (HCC) 02/14/2019  ° Dementia without behavioral disturbance (HCC) 02/14/2019  ° Postoperative pain   ° Rupture of left patellar tendon 02/15/2019  ° Hypoalbuminemia   ° Leukocytosis   ° Hyperglycemia   ° Persistent atrial fibrillation with RVR (HCC)   ° Acute on chronic anemia   ° Sepsis, Gram negative (HCC) 03/18/2019  ° AKI (acute kidney injury) (HCC) 05/05/2019  ° Transient hypotension 05/05/2019  ° Protein-calorie malnutrition, severe 05/18/2019  ° Chronic diastolic CHF (congestive heart failure) (HCC) 05/22/2019  ° Gout 05/22/2019  ° Sepsis (HCC) 05/22/2019  ° Bacteremia due to Enterococcus 05/24/2019  ° Urinary tract infection 05/24/2019  ° °Resolved Ambulatory Problems  °  Diagnosis Date Noted  ° Encounter for therapeutic drug monitoring 12/02/2013  ° Arthritis, septic, knee (HCC) 02/09/2019  ° BPH (benign prostatic hyperplasia)   ° Severe sepsis (HCC) 05/05/2019  ° Acute metabolic encephalopathy 05/22/2019  ° °Past Medical History:  °Diagnosis Date  ° BPH (benign prostatic hypertrophy)   ° Chronic anticoagulation   ° Chronic atrial fibrillation (HCC)   ° Diastolic dysfunction October 2010  ° Hx SBO   ° Hypertension   ° Small bowel obstruction (HCC)   ° °SOCIAL HX:  °Social History  ° °Tobacco Use  ° Smoking status: Never  ° Smokeless tobacco: Never  °Substance Use Topics  ° Alcohol use: No  °   Alcohol/week: 0.0 standard drinks  ° ° ° °ALLERGIES: No Known Allergies   °PERTINENT MEDICATIONS:  °Outpatient Encounter Medications as of 12/06/2021  °Medication Sig  ° acetaminophen (TYLENOL) 325 MG tablet Take 2 tablets (650 mg total) by mouth every 6 (six) hours as needed for mild pain, fever or headache. (Patient not taking: Reported on 06/03/2019)  ° allopurinol (ZYLOPRIM) 100 MG tablet Take 1 tablet (100 mg total) by mouth daily. (Patient taking differently: Take 100 mg by mouth at bedtime. )  ° cephALEXin (KEFLEX) 500 MG capsule Take 1 capsule (500 mg total) by mouth 4 (four) times daily.  ° Cholecalciferol (VITAMIN D-3) 25 MCG (1000 UT) CAPS Take 1,000 Units by mouth 2 (two) times a day.  ° colchicine 0.6 MG tablet Take 1 tablet (0.6 mg total) by mouth 2 (two) times daily. (Patient taking differently: Take 0.6 mg by mouth daily. )  ° cyanocobalamin (,VITAMIN B-12,)   1000 MCG/ML injection Inject 1,000 mcg into the muscle every 30 (thirty) days.  ° diltiazem (CARDIZEM CD) 120 MG 24 hr capsule Take 1 capsule (120 mg total) by mouth daily.  ° docusate sodium (COLACE) 100 MG capsule Take 1 capsule (100 mg total) by mouth 2 (two) times daily. (Patient taking differently: Take 100 mg by mouth at bedtime. )  ° donepezil (ARICEPT) 10 MG tablet Take 1 tablet (10 mg total) by mouth daily. (Patient taking differently: Take 10 mg by mouth at bedtime. )  ° feeding supplement, ENSURE ENLIVE, (ENSURE ENLIVE) LIQD Take 237 mLs by mouth 2 (two) times daily between meals. (Patient taking differently: Take 118.5-237 mLs by mouth 2 (two) times daily as needed (for supplementation). CHOCOLATE)  ° finasteride (PROSCAR) 5 MG tablet Take 1 tablet (5 mg total) by mouth every morning.  ° lithium carbonate 150 MG capsule TAKE 1 CAPSULE BY MOUTH EVERY NIGHT AT BEDTIME. APPOINTMENT NEEDED  ° Multiple Vitamins-Minerals (ICAPS AREDS 2 PO) Take 1 capsule by mouth daily.   ° omega-3 fish oil (MAXEPA) 1000 MG CAPS capsule Take 1,000 mg by mouth  daily with breakfast.   ° warfarin (COUMADIN) 2.5 MG tablet Take 1 tablet (2.5 mg total) by mouth daily. (Patient taking differently: Take 2.5 mg by mouth daily at 6 PM. )  ° °No facility-administered encounter medications on file as of 12/06/2021.  °Med list per epic is only one available at present. ° °Thank you for the opportunity to participate in the care of Russell Dillon.  The palliative care team will continue to follow. Please call our office at [phone] if we can be of additional assistance.  ° ° , DO ° °COVID-19 PATIENT SCREENING TOOL °Asked and negative response unless otherwise noted: ° °Have you had symptoms of covid, tested positive or been in contact with someone with symptoms/positive test in the past 5-10 days? no ° °

## 2021-12-08 DIAGNOSIS — N401 Enlarged prostate with lower urinary tract symptoms: Secondary | ICD-10-CM | POA: Diagnosis not present

## 2021-12-08 DIAGNOSIS — K5900 Constipation, unspecified: Secondary | ICD-10-CM | POA: Diagnosis not present

## 2021-12-08 DIAGNOSIS — F02811 Dementia in other diseases classified elsewhere, unspecified severity, with agitation: Secondary | ICD-10-CM | POA: Diagnosis not present

## 2021-12-08 DIAGNOSIS — N39498 Other specified urinary incontinence: Secondary | ICD-10-CM | POA: Diagnosis not present

## 2021-12-08 DIAGNOSIS — E785 Hyperlipidemia, unspecified: Secondary | ICD-10-CM | POA: Diagnosis not present

## 2021-12-08 DIAGNOSIS — Z9181 History of falling: Secondary | ICD-10-CM | POA: Diagnosis not present

## 2021-12-08 DIAGNOSIS — I10 Essential (primary) hypertension: Secondary | ICD-10-CM | POA: Diagnosis not present

## 2021-12-08 DIAGNOSIS — M1990 Unspecified osteoarthritis, unspecified site: Secondary | ICD-10-CM | POA: Diagnosis not present

## 2021-12-08 DIAGNOSIS — Z87440 Personal history of urinary (tract) infections: Secondary | ICD-10-CM | POA: Diagnosis not present

## 2021-12-08 DIAGNOSIS — M6281 Muscle weakness (generalized): Secondary | ICD-10-CM | POA: Diagnosis not present

## 2021-12-08 DIAGNOSIS — Z7901 Long term (current) use of anticoagulants: Secondary | ICD-10-CM | POA: Diagnosis not present

## 2021-12-08 DIAGNOSIS — G309 Alzheimer's disease, unspecified: Secondary | ICD-10-CM | POA: Diagnosis not present

## 2021-12-08 DIAGNOSIS — I480 Paroxysmal atrial fibrillation: Secondary | ICD-10-CM | POA: Diagnosis not present

## 2021-12-08 DIAGNOSIS — M25562 Pain in left knee: Secondary | ICD-10-CM | POA: Diagnosis not present

## 2021-12-08 DIAGNOSIS — F319 Bipolar disorder, unspecified: Secondary | ICD-10-CM | POA: Diagnosis not present

## 2021-12-24 DIAGNOSIS — I10 Essential (primary) hypertension: Secondary | ICD-10-CM | POA: Diagnosis not present

## 2021-12-24 DIAGNOSIS — F319 Bipolar disorder, unspecified: Secondary | ICD-10-CM | POA: Diagnosis not present

## 2021-12-24 DIAGNOSIS — F02811 Dementia in other diseases classified elsewhere, unspecified severity, with agitation: Secondary | ICD-10-CM | POA: Diagnosis not present

## 2021-12-24 DIAGNOSIS — M25562 Pain in left knee: Secondary | ICD-10-CM | POA: Diagnosis not present

## 2021-12-24 DIAGNOSIS — G309 Alzheimer's disease, unspecified: Secondary | ICD-10-CM | POA: Diagnosis not present

## 2021-12-24 DIAGNOSIS — K5900 Constipation, unspecified: Secondary | ICD-10-CM | POA: Diagnosis not present

## 2021-12-28 ENCOUNTER — Other Ambulatory Visit: Payer: Self-pay | Admitting: Psychiatry

## 2022-01-04 DIAGNOSIS — M25562 Pain in left knee: Secondary | ICD-10-CM | POA: Diagnosis not present

## 2022-01-04 DIAGNOSIS — K5900 Constipation, unspecified: Secondary | ICD-10-CM | POA: Diagnosis not present

## 2022-01-04 DIAGNOSIS — I10 Essential (primary) hypertension: Secondary | ICD-10-CM | POA: Diagnosis not present

## 2022-01-04 DIAGNOSIS — G309 Alzheimer's disease, unspecified: Secondary | ICD-10-CM | POA: Diagnosis not present

## 2022-01-04 DIAGNOSIS — F02811 Dementia in other diseases classified elsewhere, unspecified severity, with agitation: Secondary | ICD-10-CM | POA: Diagnosis not present

## 2022-01-04 DIAGNOSIS — F319 Bipolar disorder, unspecified: Secondary | ICD-10-CM | POA: Diagnosis not present

## 2022-01-07 DIAGNOSIS — E785 Hyperlipidemia, unspecified: Secondary | ICD-10-CM | POA: Diagnosis not present

## 2022-01-07 DIAGNOSIS — N39498 Other specified urinary incontinence: Secondary | ICD-10-CM | POA: Diagnosis not present

## 2022-01-07 DIAGNOSIS — Z9181 History of falling: Secondary | ICD-10-CM | POA: Diagnosis not present

## 2022-01-07 DIAGNOSIS — F319 Bipolar disorder, unspecified: Secondary | ICD-10-CM | POA: Diagnosis not present

## 2022-01-07 DIAGNOSIS — N401 Enlarged prostate with lower urinary tract symptoms: Secondary | ICD-10-CM | POA: Diagnosis not present

## 2022-01-07 DIAGNOSIS — K5900 Constipation, unspecified: Secondary | ICD-10-CM | POA: Diagnosis not present

## 2022-01-07 DIAGNOSIS — I10 Essential (primary) hypertension: Secondary | ICD-10-CM | POA: Diagnosis not present

## 2022-01-07 DIAGNOSIS — G309 Alzheimer's disease, unspecified: Secondary | ICD-10-CM | POA: Diagnosis not present

## 2022-01-07 DIAGNOSIS — M1990 Unspecified osteoarthritis, unspecified site: Secondary | ICD-10-CM | POA: Diagnosis not present

## 2022-01-07 DIAGNOSIS — Z87440 Personal history of urinary (tract) infections: Secondary | ICD-10-CM | POA: Diagnosis not present

## 2022-01-07 DIAGNOSIS — I480 Paroxysmal atrial fibrillation: Secondary | ICD-10-CM | POA: Diagnosis not present

## 2022-01-07 DIAGNOSIS — Z7901 Long term (current) use of anticoagulants: Secondary | ICD-10-CM | POA: Diagnosis not present

## 2022-01-07 DIAGNOSIS — M6281 Muscle weakness (generalized): Secondary | ICD-10-CM | POA: Diagnosis not present

## 2022-01-07 DIAGNOSIS — F02811 Dementia in other diseases classified elsewhere, unspecified severity, with agitation: Secondary | ICD-10-CM | POA: Diagnosis not present

## 2022-01-07 DIAGNOSIS — M25562 Pain in left knee: Secondary | ICD-10-CM | POA: Diagnosis not present

## 2022-01-08 DIAGNOSIS — N182 Chronic kidney disease, stage 2 (mild): Secondary | ICD-10-CM | POA: Diagnosis not present

## 2022-01-08 DIAGNOSIS — E785 Hyperlipidemia, unspecified: Secondary | ICD-10-CM | POA: Diagnosis not present

## 2022-01-08 DIAGNOSIS — I480 Paroxysmal atrial fibrillation: Secondary | ICD-10-CM | POA: Diagnosis not present

## 2022-01-08 DIAGNOSIS — I1310 Hypertensive heart and chronic kidney disease without heart failure, with stage 1 through stage 4 chronic kidney disease, or unspecified chronic kidney disease: Secondary | ICD-10-CM | POA: Diagnosis not present

## 2022-01-10 DIAGNOSIS — F02811 Dementia in other diseases classified elsewhere, unspecified severity, with agitation: Secondary | ICD-10-CM | POA: Diagnosis not present

## 2022-01-10 DIAGNOSIS — I10 Essential (primary) hypertension: Secondary | ICD-10-CM | POA: Diagnosis not present

## 2022-01-10 DIAGNOSIS — K5900 Constipation, unspecified: Secondary | ICD-10-CM | POA: Diagnosis not present

## 2022-01-10 DIAGNOSIS — G309 Alzheimer's disease, unspecified: Secondary | ICD-10-CM | POA: Diagnosis not present

## 2022-01-10 DIAGNOSIS — Z20822 Contact with and (suspected) exposure to covid-19: Secondary | ICD-10-CM | POA: Diagnosis not present

## 2022-01-10 DIAGNOSIS — F319 Bipolar disorder, unspecified: Secondary | ICD-10-CM | POA: Diagnosis not present

## 2022-01-10 DIAGNOSIS — M25562 Pain in left knee: Secondary | ICD-10-CM | POA: Diagnosis not present

## 2022-01-11 ENCOUNTER — Other Ambulatory Visit: Payer: Self-pay

## 2022-01-11 ENCOUNTER — Other Ambulatory Visit: Payer: Medicare Other | Admitting: *Deleted

## 2022-01-11 VITALS — BP 112/75 | HR 94 | Temp 97.7°F | Resp 17

## 2022-01-11 DIAGNOSIS — Z515 Encounter for palliative care: Secondary | ICD-10-CM

## 2022-01-11 NOTE — Progress Notes (Signed)
[name]  ? ?PATIENT NAME: Russell Dillon ?DOB: 20-Mar-1946 ?MRN: [id number] ? ?PRIMARY CARE PROVIDER: Ginger Organ., MD ? ?RESPONSIBLE PARTY: Antwaun Buth (wife) ?Acct ID - Guarantor Home Phone Work Phone Relationship Acct Type  ?[id number] Russell Dillon,* [phone]  Self P/F  ?   [address] [place], [place], [place] [address]  ? ?Covid-19 Pre-screening Negative ? ?RN PRN made as requested by Dr. Hollace Kinnier. Patient has had a cough for the past 2-3 days. Wife states that his cough sounds "rattly" at times. She reports he does cough phlegm up at times, but immediately swallows it back down. He is afebrile. Denies shortness of breath and none noted during visit today. She says his PCP recommended Delsym to help. She started giving Delsym 2-3 days ago and it does seem to help some. She has not given him any today. Lungs are clear on auscultation at this time. Congestion appears to be more in patient's throat region. Patient states that he feels fine. Recommended that wife continue Delsym. I will call patient's wife on Monday 01/14/22 to follow up on patient's condition from over the weekend. Provided update to Dr. Mariea Clonts. Palliative care will continue to follow.  ? ?PHYSICAL EXAM:  ? ?VITALS: ?Today's Vitals  ? 01/11/22 1607  ?BP: 112/75  ?Pulse: 94  ?Resp: 17  ?Temp: 97.7 ?F (36.5 ?C)  ?TempSrc: Oral  ?SpO2: 96%  ?PainSc: 0-No pain  ?  ? ?(Duration of visit and documentation 30 minutes) ? ? ? ?Daryl Eastern, RN BSN ? ?

## 2022-01-14 ENCOUNTER — Other Ambulatory Visit: Payer: Self-pay

## 2022-01-14 ENCOUNTER — Other Ambulatory Visit: Payer: Medicare Other | Admitting: *Deleted

## 2022-01-14 DIAGNOSIS — Z515 Encounter for palliative care: Secondary | ICD-10-CM

## 2022-01-14 NOTE — Progress Notes (Signed)
[facility] PALLIATIVE CARE RN NOTE ? ?PATIENT NAME: Russell Dillon ?DOB: [birth year] ?MRN: [id number] ? ?PRIMARY CARE PROVIDER: Ginger Organ., MD ? ?RESPONSIBLE PARTY: Marquavion Venhuizen (wife) ?Acct ID - Guarantor Home Phone Work Phone Relationship Acct Type  ?[id number] Thos Matsumoto,* [id number]  Self P/F  ?   [address] [place], [place],  [address]  ? ?Due to the COVID-19 crisis, this virtual check-in visit was done via telephone from my office and it was initiated and consent by this patient and or family. ? ?RN telephonic encounter completed with patient's wife [name] to follow up on patient's cough. I visited with patient on 01/11/22 due to patient coughing for 2-3 days. His lungs were clear and he remained afebrile. Wife states that patient's cough is improving and patient seems to be doing better. She did not have to give him any Delsym yesterday. She is appreciative of call. Advised to contact us if cough worsens or with any other questions or concerns.  ? ?(Duration of visit and documentation 15 minutes) ? ? ?Daryl Eastern, RN BSN ? ?

## 2022-01-18 ENCOUNTER — Other Ambulatory Visit: Payer: Medicare Other | Admitting: *Deleted

## 2022-01-18 ENCOUNTER — Other Ambulatory Visit: Payer: Self-pay

## 2022-01-18 DIAGNOSIS — F319 Bipolar disorder, unspecified: Secondary | ICD-10-CM | POA: Diagnosis not present

## 2022-01-18 DIAGNOSIS — K5900 Constipation, unspecified: Secondary | ICD-10-CM | POA: Diagnosis not present

## 2022-01-18 DIAGNOSIS — I10 Essential (primary) hypertension: Secondary | ICD-10-CM | POA: Diagnosis not present

## 2022-01-18 DIAGNOSIS — G309 Alzheimer's disease, unspecified: Secondary | ICD-10-CM | POA: Diagnosis not present

## 2022-01-18 DIAGNOSIS — F02811 Dementia in other diseases classified elsewhere, unspecified severity, with agitation: Secondary | ICD-10-CM | POA: Diagnosis not present

## 2022-01-18 DIAGNOSIS — M25562 Pain in left knee: Secondary | ICD-10-CM | POA: Diagnosis not present

## 2022-01-22 ENCOUNTER — Other Ambulatory Visit: Payer: Self-pay

## 2022-01-22 ENCOUNTER — Other Ambulatory Visit: Payer: Medicare Other | Admitting: *Deleted

## 2022-01-22 NOTE — Progress Notes (Signed)
[facility] PALLIATIVE CARE RN NOTE ? ?PATIENT NAME: Russell Dillon ?DOB: 1946-06-06 ?MRN: [id number] ? ?PRIMARY CARE PROVIDER: Ginger Organ., MD ? ?RESPONSIBLE PARTY: [name] (wife) ?Acct ID - Guarantor Home Phone Work Phone Relationship Acct Type  ?[id number] Russell Dillon,* [phone]  Self P/F  ?   [address] [place], [place], [place] [address]  ? ?Due to the COVID-19 crisis, this virtual check-in visit was done via telephone from my office and it was initiated and consent by this patient and or family. ? ?RN telephonic encounter completed with patient's wife Russell Dillon. She advised that patient has some redness and inflammation that is spreading on his right lower leg. She is unsure of the cause. Area is irritated and itching. Patient will not stop scratching. She has tried hydrocortisone cream and polysporin with no success. Yesterday where patient has scratched was bleeding some. None noted today. Wife sent pictures via secure phone. There are scattered areas of redness that looks like scratches mixed with some petechiae. Communicated this with NP Damaris Hippo since Dr. Mariea Clonts is out of the office today. She recommended Sarna lotion or Aquaphor to be applied up to 4 times/daily. Information given to wife. She states she will have her daughter bring these items over in the morning. She is appreciative of call. Palliative care will continue to follow.  ? ? ?(Duration of visit and documentation 20 minutes) ? ? ? ?Daryl Eastern, RN BSN ? ?

## 2022-01-23 DIAGNOSIS — I10 Essential (primary) hypertension: Secondary | ICD-10-CM | POA: Diagnosis not present

## 2022-01-23 DIAGNOSIS — K5900 Constipation, unspecified: Secondary | ICD-10-CM | POA: Diagnosis not present

## 2022-01-23 DIAGNOSIS — M25562 Pain in left knee: Secondary | ICD-10-CM | POA: Diagnosis not present

## 2022-01-23 DIAGNOSIS — G309 Alzheimer's disease, unspecified: Secondary | ICD-10-CM | POA: Diagnosis not present

## 2022-01-23 DIAGNOSIS — F02811 Dementia in other diseases classified elsewhere, unspecified severity, with agitation: Secondary | ICD-10-CM | POA: Diagnosis not present

## 2022-01-23 DIAGNOSIS — F319 Bipolar disorder, unspecified: Secondary | ICD-10-CM | POA: Diagnosis not present

## 2022-01-24 NOTE — Progress Notes (Signed)
[facility] PALLIATIVE CARE RN NOTE ? ?PATIENT NAME: Russell Dillon ?DOB: 1946/03/23 ?MRN: [id number] ? ?PRIMARY CARE PROVIDER: Ginger Organ., MD ? ?RESPONSIBLE PARTY: Russell Dillon (wife)  ?Acct ID - Guarantor Home Phone Work Phone Relationship Acct Type  ?[id number] Russell Dillon,* [phone]  Self P/F  ?   [address] [place], [place], [place] [address]  ? ?Due to the COVID-19 crisis, this virtual check-in visit was done via telephone from my office and it was initiated and consent by this patient and or family. ? ? ?RN telephonic encounter completed with patient's wife, Russell Dillon. She advised that patient's leg rash/redness is improving. She has been using a protective barrier cream on it over the weekend and feels that it is doing better. Less redness and patient has not been scratching. Her daughter is still going to bring her the Sarna lotion and Aquaphor ointment today recommended by our palliative NP on Friday. She is appreciative of follow-up. Palliative care will continue to follow.  ? ?(Duration of visit and documentation 10 minutes) ? ? ?Daryl Eastern, RN BSN ? ?

## 2022-01-30 DIAGNOSIS — Z20822 Contact with and (suspected) exposure to covid-19: Secondary | ICD-10-CM | POA: Diagnosis not present

## 2022-02-11 DIAGNOSIS — Z20822 Contact with and (suspected) exposure to covid-19: Secondary | ICD-10-CM | POA: Diagnosis not present

## 2022-02-14 ENCOUNTER — Other Ambulatory Visit: Payer: Medicare Other | Admitting: Internal Medicine

## 2022-02-14 VITALS — BP 108/75 | HR 99 | Temp 97.8°F | Resp 18

## 2022-02-14 DIAGNOSIS — R293 Abnormal posture: Secondary | ICD-10-CM

## 2022-02-14 DIAGNOSIS — Z741 Need for assistance with personal care: Secondary | ICD-10-CM

## 2022-02-14 DIAGNOSIS — F01C3 Vascular dementia, severe, with mood disturbance: Secondary | ICD-10-CM

## 2022-02-14 DIAGNOSIS — R29898 Other symptoms and signs involving the musculoskeletal system: Secondary | ICD-10-CM | POA: Diagnosis not present

## 2022-02-14 DIAGNOSIS — Z515 Encounter for palliative care: Secondary | ICD-10-CM

## 2022-02-15 ENCOUNTER — Encounter: Payer: Self-pay | Admitting: Internal Medicine

## 2022-02-15 ENCOUNTER — Encounter: Payer: Self-pay | Admitting: *Deleted

## 2022-02-15 NOTE — Progress Notes (Signed)
[facility] Follow-Up Visit Telephone: [phone]  Fax: [phone]   Date of encounter: 02/15/22 4:47 PM PATIENT NAME: Russell Dillon [place] [id number]   [phone] (home)  DOB: 1946-01-06 MRN: [id number] PRIMARY CARE PROVIDER:    Ginger Organ., MD,  [place] [address] [phone]  REFERRING PROVIDER:   Ginger Organ., MD [address] [place],  [place] [address] [phone]  RESPONSIBLE PARTY:    Contact Information     Name Relation Home Work Mobile   Boyack,[name] Spouse [phone]  [phone]   [facility] Daughter   [phone]       Due to the COVID-19 crisis, this visit was done via telemedicine from my office and it was initiated and consent by this patient and or family.  I connected with  Gayleen Orem OR PROXY (and his wife, [name] through Daryl Eastern, RN) on 02/15/22 by a video enabled telemedicine application and verified that I am speaking with the correct person using two identifiers.   I discussed the limitations of evaluation and management by telemedicine. The patient expressed understanding and agreed to proceed.  Palliative Care was asked to follow this patient by consultation request of  Ginger Organ., MD to address advance care planning and complex medical decision making. This is follow-up visit.                                     ASSESSMENT AND PLAN / RECOMMENDATIONS:   Advance Care Planning/Goals of Care: Goals include to maximize quality of life and symptom management. Patient/health care surrogate gave his/her permission to discuss.Our advance care planning conversation included a discussion about:    The value and importance of advance care planning  Experiences with loved ones who have been seriously ill or have died  Exploration of personal, cultural or spiritual beliefs that might influence  medical decisions  Exploration of goals of care in the event of a sudden injury or illness  Identification  of a healthcare agent  Review and updating or creation of an  advance directive document . Decision not to resuscitate or to de-escalate disease focused treatments due to poor prognosis. CODE STATUS:  MOST on file  Symptom Management/Plan: 1. Severe vascular dementia with mood disturbance ([place]) -unable to control his own body or reposition himself--needs help with all activities at this time  2. Postural instability -prevents him from repositioning himself if he slides partially out of the bed; needs full bedrails to keep him from falling out  3. Requires daily assistance for activities of daily living (ADL) and comfort needs -his wife and paid caregivers provide 24x7 care for him at home, he is bedbound in a hospital bed  4. Weakness of both legs -he's unable to get out of bed but his legs slide on their own and he is high risk for falling out of the bed, will request full bedrails for patient to prevent this as small partial rail has been ineffective at this point--family requesting full bedrails   5. Palliative care encounter -visit completed with RN via video today    Follow up Palliative Care Visit: Palliative care will continue to follow for complex medical decision making, advance care planning, and clarification of goals. Return for regular visit and prn  This visit was coded based on medical decision making (MDM).  PPS: 40%  HOSPICE ELIGIBILITY/DIAGNOSIS: TBD  Chief Complaint: Follow-up palliative visit  HISTORY OF PRESENT ILLNESS:  Russell Dillon is a 76 y.o. year old male  with with dementia and bipolar who is bedbound.  He's seen today due to concerns from his wife, [name], that he's been sliding out of his hospital bed--he will be found with his legs hanging way far off--he's a tall man and unable to reposition himself.  They are requesting full bedrails to  help prevent him from falling out of the bed altogether b/c the small partial one they have is ineffective.   History obtained from review of EMR, discussion with primary team, and interview with family, facility staff/caregiver and/or Mr. Heckmann.  I reviewed available labs, medications, imaging, studies and related documents from the EMR.  Records reviewed and summarized above.   ROS  General: NAD EYES: denies vision changes ENMT: has dysphagia Cardiovascular: denies chest pain, denies DOE Pulmonary: denies cough, denies increased SOB Abdomen: endorses good appetite, denies constipation, endorses incontinence of bowel GU: denies dysuria, endorses incontinence of urine MSK:  denies increased weakness,  no falls reported but concern b/c he's sliding out of bed partially and high risk for falling as he has no strength to reposition his own body Skin: denies rashes or wounds Neurological: denies pain, denies insomnia Psych: Endorses positive mood--flat affect Heme/lymph/immuno: denies bruises, abnormal bleeding  Physical Exam: Current and past weights: not assessed today Constitutional: NAD General: overweight, flat affect EYES: anicteric sclera, lids intact, no discharge  ENMT: hard of hearing, oral mucous membranes moist, dentition intact Abdomen: intake 100%, normo-active BS + 4 quadrants, soft and non tender, no ascites GU: deferred MSK: sarcopenia, moves all extremities but no postural stability, seen with legs stretched falling off of bed despite presence of small bed rail at the end  Skin: warm and dry, no rashes or wounds on visible skin Neuro:  generalized weakness,  advanced cognitive impairment Psych: non-anxious affect, Alert, oriented to place and familiar faces, not time Hem/lymph/immuno: no widespread bruising  CURRENT PROBLEM LIST:  Patient Active Problem List   Diagnosis Date Noted   Bacteremia due to Enterococcus 05/24/2019   Urinary tract infection 05/24/2019    Chronic diastolic CHF (congestive heart failure) ([place]) 05/22/2019   Gout 05/22/2019   Sepsis ([place]) 05/22/2019   Protein-calorie malnutrition, severe 05/18/2019   AKI (acute kidney injury) ([place]) 05/05/2019   Transient hypotension 05/05/2019   Sepsis, Gram negative ([place]) 03/18/2019   Hypoalbuminemia    Leukocytosis    Hyperglycemia    Persistent atrial fibrillation with RVR (HCC)    Acute on chronic anemia    Rupture of left patellar tendon 02/15/2019   Postoperative pain    Bipolar 1 disorder ([place]) 02/14/2019   Dementia without behavioral disturbance ([place]) 02/14/2019   Patellar tendon rupture, left, initial encounter 02/10/2019   SIRS (systemic inflammatory response syndrome) ([place]) 02/09/2019   Osteomyelitis ([place]) 02/09/2019   Acute blood loss anemia 02/09/2019   Aortic valve regurgitation 10/16/2018   SBO (small bowel obstruction) ([place]) 07/08/2013   Hyperlipidemia 12/20/2011   HTN (hypertension) 06/04/2011   Atrial fibrillation, chronic ([place]) 03/01/2011   Long term (current) use of anticoagulants 03/01/2011   GERD 10/11/2009   DIVERTICULOSIS OF COLON 10/11/2009   PAST MEDICAL HISTORY:  Active Ambulatory Problems    Diagnosis Date Noted   GERD 10/11/2009   DIVERTICULOSIS OF COLON 10/11/2009   Atrial fibrillation, chronic ([place]) 03/01/2011   Long term (current) use of anticoagulants 03/01/2011  HTN (hypertension) 06/04/2011   Hyperlipidemia 12/20/2011   SBO (small bowel obstruction) (HCC) 07/08/2013   Aortic valve regurgitation 10/16/2018   SIRS (systemic inflammatory response syndrome) ([place]) 02/09/2019   Osteomyelitis ([place]) 02/09/2019   Acute blood loss anemia 02/09/2019   Patellar tendon rupture, left, initial encounter 02/10/2019   Bipolar 1 disorder ([place]) 02/14/2019   Dementia without behavioral disturbance ([place]) 02/14/2019   Postoperative pain    Rupture of left patellar tendon 02/15/2019   Hypoalbuminemia    Leukocytosis    Hyperglycemia    Persistent atrial  fibrillation with RVR ([place])    Acute on chronic anemia    Sepsis, Gram negative ([place]) 03/18/2019   AKI (acute kidney injury) ([place]) 05/05/2019   Transient hypotension 05/05/2019   Protein-calorie malnutrition, severe 05/18/2019   Chronic diastolic CHF (congestive heart failure) ([place]) 05/22/2019   Gout 05/22/2019   Sepsis ([place]) 05/22/2019   Bacteremia due to Enterococcus 05/24/2019   Urinary tract infection 05/24/2019   Resolved Ambulatory Problems    Diagnosis Date Noted   Encounter for therapeutic drug monitoring 12/02/2013   Arthritis, septic, knee ([place]) 02/09/2019   BPH (benign prostatic hyperplasia)    Severe sepsis ([place]) 94/85/4627   Acute metabolic encephalopathy 03/50/0938   Past Medical History:  Diagnosis Date   BPH (benign prostatic hypertrophy)    Chronic anticoagulation    Chronic atrial fibrillation (HCC)    Diastolic dysfunction October 2010   Hx SBO    Hypertension    Small bowel obstruction ([place])    SOCIAL HX:  Social History   Tobacco Use   Smoking status: Never   Smokeless tobacco: Never  Substance Use Topics   Alcohol use: No    Alcohol/week: 0.0 standard drinks     ALLERGIES: No Known Allergies   PERTINENT MEDICATIONS:  Outpatient Encounter Medications as of 02/14/2022  Medication Sig   acetaminophen (TYLENOL) 325 MG tablet Take 2 tablets (650 mg total) by mouth every 6 (six) hours as needed for mild pain, fever or headache. (Patient not taking: Reported on 06/03/2019)   allopurinol (ZYLOPRIM) 100 MG tablet Take 1 tablet (100 mg total) by mouth daily. (Patient taking differently: Take 100 mg by mouth at bedtime. )   cephALEXin (KEFLEX) 500 MG capsule Take 1 capsule (500 mg total) by mouth 4 (four) times daily.   Cholecalciferol (VITAMIN D-3) 25 MCG (1000 UT) CAPS Take 1,000 Units by mouth 2 (two) times a day.   colchicine 0.6 MG tablet Take 1 tablet (0.6 mg total) by mouth 2 (two) times daily. (Patient taking differently: Take 0.6 mg by mouth daily. )    cyanocobalamin (,VITAMIN B-12,) 1000 MCG/ML injection Inject 1,000 mcg into the muscle every 30 (thirty) days.   diltiazem (CARDIZEM CD) 120 MG 24 hr capsule Take 1 capsule (120 mg total) by mouth daily.   docusate sodium (COLACE) 100 MG capsule Take 1 capsule (100 mg total) by mouth 2 (two) times daily. (Patient taking differently: Take 100 mg by mouth at bedtime. )   donepezil (ARICEPT) 10 MG tablet Take 1 tablet (10 mg total) by mouth daily. (Patient taking differently: Take 10 mg by mouth at bedtime. )   feeding supplement, ENSURE ENLIVE, (ENSURE ENLIVE) LIQD Take 237 mLs by mouth 2 (two) times daily between meals. (Patient taking differently: Take 118.5-237 mLs by mouth 2 (two) times daily as needed (for supplementation). CHOCOLATE)   finasteride (PROSCAR) 5 MG tablet Take 1 tablet (5 mg total) by mouth every morning.   lithium carbonate  150 MG capsule TAKE 1 CAPSULE BY MOUTH EVERY NIGHT AT BEDTIME. APPOINTMENT NEEDED   Multiple Vitamins-Minerals (ICAPS AREDS 2 PO) Take 1 capsule by mouth daily.    omega-3 fish oil (MAXEPA) 1000 MG CAPS capsule Take 1,000 mg by mouth daily with breakfast.    warfarin (COUMADIN) 2.5 MG tablet Take 1 tablet (2.5 mg total) by mouth daily. (Patient taking differently: Take 2.5 mg by mouth daily at 6 PM. )   No facility-administered encounter medications on file as of 02/14/2022.   Thank you for the opportunity to participate in the care of Mr. Treat.  The palliative care team will continue to follow. Please call our office at [phone] if we can be of additional assistance.   Hollace Kinnier, DO  COVID-19 PATIENT SCREENING TOOL Asked and negative response unless otherwise noted:  Have you had symptoms of covid, tested positive or been in contact with someone with symptoms/positive test in the past 5-10 days? no

## 2022-02-15 NOTE — Progress Notes (Signed)
Faxed order to [facility] for full bed rails for patient per Dr. Hollace Kinnier as requested by patient's wife [name].  ?

## 2022-02-23 DIAGNOSIS — Z20822 Contact with and (suspected) exposure to covid-19: Secondary | ICD-10-CM | POA: Diagnosis not present

## 2022-02-25 DIAGNOSIS — Z20822 Contact with and (suspected) exposure to covid-19: Secondary | ICD-10-CM | POA: Diagnosis not present

## 2022-03-02 DIAGNOSIS — Z20822 Contact with and (suspected) exposure to covid-19: Secondary | ICD-10-CM | POA: Diagnosis not present

## 2022-03-04 DIAGNOSIS — Z20822 Contact with and (suspected) exposure to covid-19: Secondary | ICD-10-CM | POA: Diagnosis not present

## 2022-03-05 DIAGNOSIS — Z20822 Contact with and (suspected) exposure to covid-19: Secondary | ICD-10-CM | POA: Diagnosis not present

## 2022-03-07 ENCOUNTER — Other Ambulatory Visit: Payer: Medicare Other | Admitting: Internal Medicine

## 2022-03-07 DIAGNOSIS — R293 Abnormal posture: Secondary | ICD-10-CM | POA: Diagnosis not present

## 2022-03-07 DIAGNOSIS — Z741 Need for assistance with personal care: Secondary | ICD-10-CM

## 2022-03-07 DIAGNOSIS — Z515 Encounter for palliative care: Secondary | ICD-10-CM | POA: Diagnosis not present

## 2022-03-07 DIAGNOSIS — F01C3 Vascular dementia, severe, with mood disturbance: Secondary | ICD-10-CM | POA: Diagnosis not present

## 2022-03-07 DIAGNOSIS — R634 Abnormal weight loss: Secondary | ICD-10-CM | POA: Diagnosis not present

## 2022-03-07 NOTE — Progress Notes (Signed)
[facility] Follow-Up Visit Telephone: [phone]  Fax: [phone]   Date of encounter: 03/07/22 12:39 PM PATIENT NAME: Russell Dillon [address] [address]   [phone] (home)  DOB: 12-31-1945 MRN: [id number] PRIMARY CARE PROVIDER:    Ginger Dillon., MD,  [place] [address] [phone]  REFERRING PROVIDER:   Ginger Dillon., MD [address] [place],  [place] [address] [phone]  RESPONSIBLE PARTY:    Contact Information     Name Relation Home Work Mobile   Russell Dillon Spouse [phone]  [phone]   [facility] Daughter   [phone]        I met face to face with patient and family in his home. Palliative Care was asked to follow this patient by consultation request of  Russell Dillon., MD to address advance care planning and complex medical decision making. This is follow-up visit.                                     ASSESSMENT AND PLAN / RECOMMENDATIONS:   Advance Care Planning/Goals of Care: Goals include to maximize quality of life and symptom management. Patient/health care surrogate gave his/her permission to discuss.Our advance care planning conversation included a discussion about:    The value and importance of advance care planning  Experiences with loved ones who have been seriously ill or have died  Exploration of personal, cultural or spiritual beliefs that might influence medical decisions  Exploration of goals of care in the event of a sudden injury or illness  Identification  of a healthcare agent  Review and updating or creation of an  advance directive document . Decision not to resuscitate or to de-escalate disease focused treatments due to poor prognosis.   Symptom Management/Plan: 1. Severe vascular dementia with mood disturbance ([place]) -previously goals remained aggressive as far as returning to the  Dillon for care, treating infections; however, discussion Russell Dillon indicated she'd researched hospice eligibility criteria and thought that with her dad's recent decline, he might be nearing that point -when seen today, I agree that he could be assessed by the hospice nurse for hospice eligibility if Russell Dillon agrees -at this time, plan is for labwork he requested to be drawn by our RN tomorrow plus an albumin I'd like to have -he's losing weight, not eating but 1 of 2 eggs and not much else all day except part of a shake, has a pressure injury to his thorax now despite air mattress, and has clear evidence of weight loss in his face and shoulders with caregiver noting it's easier to move him, he's lost postural stability -Russell Dillon is going to discuss hospice option with Russell Dillon when she returns from her vacation and with Russell Dillon during his video visit next week and let us know the outcome--I'll send this not to Russell Dillon to make him aware  2. Postural instability -has become progressively worse to where we had to add full length rail and now foam cushions to keep him from falling out of bed as he slides his lower body unintentionally around at night  3. Requires daily assistance for activities of daily living (ADL) and comfort needs -continue full adl care by caregivers with some role of his wife in this, too -certainly would help to have more team members involved for caregiver support for Russell Dillon  4. Weight loss -cannot weigh as he cannot get out of bed, but clearly has lost weight in face from when I saw him last and even from video visit just 3 wks ago, cont to offer shakes and snacks as he is interested with aspiration precautions  5. Palliative care encounter -educated about disease trajectory, signs that suggest he may now be hospice eligible -will send this note to Russell Dillon for his input about 6 mos or less prognosis when he does video visit for patient next week and reviews labs to be  drawn tomorrow; also will need to know if he will serve as attending of record if he is admitted to hospice -if he and family agree, we can pursue [facility] hospice evaluation  Follow up Palliative Care Visit: Palliative care will continue to follow for complex medical decision making, advance care planning, and clarification of goals. Return if not admitted to hospice.  Otherwise hospice team to follow  This visit was coded based on medical decision making (MDM). 20 minutes spent on acp  PPS: weak 30%  HOSPICE ELIGIBILITY/DIAGNOSIS: TBD/dementia  Chief Complaint: Follow-up palliative visit  HISTORY OF PRESENT ILLNESS:  Russell Dillon is a 76 y.o. year old male  with severe vascular dementia, postural instability, bipolar disorder, chronic diastolic chf, chronic afib on coumadin, aortic regurgitation, and recently severe protein calorie malnutrition.  His daughter reached out to [place] due to decreased intake--eating only bites, was still drinking liquids.  He's been bedbound since I've know him (late summer '22 at least) and no longer has postural stability to sit up even on the edge of the bed.  He's unable to get out to doctor's appts without use of PTAR which is costly and extremely challenging for his wife to arrange even with his regular caregivers.   Sleeping the majority of the time except during bed baths.   The last few weeks, he's not eating like he was.  He normally ate "good"--not to gain weight but Usually, she will fix him 2 eggs but he's not eating even the two anymore.  He is having only bites of his other meals.  He's sometimes declining meals like yesterday afternoon.  [name] was able to get a milkshake only with ice cream in it--only part of it last night.  She'd been doing that each night, but he declined it two nights ago.  3-4 night ago, he had only a few sips of his shake.  He did drink 2 glasses of cranberry juice this morning.   2 years ago, he ate 3 eggs He's been  delusional--talking about things that are not happening. He is feisty during baths.  He will kicks his legs and ask the caregivers to leave him alone. He's now having only small bms--some will be there when wiped.  Russell Dillon has not given the miralax b/c he's not been eating enough.   He has lost weight.  His face is much slimmer.  Caregiver Elmyra Ricks, CNA) notes she can slide him up in the bed easily now.  He's not able to help pull up in the bed.  He just wants to be left alone.   There is an opening on his back now.  He cannot lay on his side.  It's been there about a week.  It's in his mid-thoracic area--stage 2 with some papular rash around.  He continues to turn himself on an angle through the night somehow.  They are getting cushions along the rails to keep him  from getting his feet jammed.   He has an alternating pressure mattress.  He has a topper on there with the big bubbles on it.   No caregivers are present from 230-6pm  CNA will not be there in the day on weekends.    October of 2022 was when he last went out to the gym and used the exercise bike.  He was wobbly, but could walk with a walker then.  He was starting to decline before that.  His CNA would have to have him sit on his rollator and roll him out of the gym.  I met him in Jan 2023  History obtained from review of EMR, discussion with primary team, and interview with family, facility staff/caregiver and/or Mr. Russett.  I reviewed available labs, medications, imaging, studies and related documents from the EMR.  Records reviewed and summarized above.   ROS   General: NAD EYES: denies vision changes ENMT: has dysphagia Cardiovascular: denies chest pain, denies DOE Pulmonary: denies cough, denies increased SOB Abdomen: endorses loss of appetite, denies constipation, endorses incontinence of bowel GU: denies dysuria, endorses incontinence of urine MSK:  has increased weakness,  no falls reported, bedbound Skin: new thoracic  spine area wound stg 2 Neurological: denies pain, denies insomnia Psych: Endorses behaviors with care, irritabilty Heme/lymph/immuno: denies bruises, abnormal bleeding  Physical Exam: Current and past weights:  no recent due to mobility challenges Constitutional: NAD General: frail appearing with buccal wasting newly narrow face/shoulders EYES: anicteric sclera, lids intact, no discharge  ENMT: intact hearing, oral mucous membranes moist, dentition intact CV: S1S2, RRR, no LE edema Pulmonary: LCTA, no increased work of breathing, no cough, room air Abdomen: intake bites to 25%, normo-active BS + 4 quadrants, soft and non tender, no ascites GU: deferred MSK: no sarcopenia, moves all extremities, nonambulatory since fall and hospitalization Skin: warm and dry, small stg 2 with rash posterior thoracic Neuro:   generalized weakness,  cognitive impairment Psych: non-anxious affect, wakes up for a few seconds, then closes his eyes again, slept through 95% of visit Hem/lymph/immuno: no widespread bruising  CURRENT PROBLEM LIST:  Patient Active Problem List   Diagnosis Date Noted   Bacteremia due to Enterococcus 05/24/2019   Urinary tract infection 05/24/2019   Chronic diastolic CHF (congestive heart failure) ([place]) 05/22/2019   Gout 05/22/2019   Sepsis ([place]) 05/22/2019   Protein-calorie malnutrition, severe 05/18/2019   AKI (acute kidney injury) ([place]) 05/05/2019   Transient hypotension 05/05/2019   Sepsis, Gram negative ([place]) 03/18/2019   Hypoalbuminemia    Leukocytosis    Hyperglycemia    Persistent atrial fibrillation with RVR (HCC)    Acute on chronic anemia    Rupture of left patellar tendon 02/15/2019   Postoperative pain    Bipolar 1 disorder (HCC) 02/14/2019   Dementia without behavioral disturbance ([place]) 02/14/2019   Patellar tendon rupture, left, initial encounter 02/10/2019   SIRS (systemic inflammatory response syndrome) ([place]) 02/09/2019   Osteomyelitis ([place]) 02/09/2019    Acute blood loss anemia 02/09/2019   Aortic valve regurgitation 10/16/2018   SBO (small bowel obstruction) ([place]) 07/08/2013   Hyperlipidemia 12/20/2011   HTN (hypertension) 06/04/2011   Atrial fibrillation, chronic ([place]) 03/01/2011   Long term (current) use of anticoagulants 03/01/2011   GERD 10/11/2009   DIVERTICULOSIS OF COLON 10/11/2009   PAST MEDICAL HISTORY:  Active Ambulatory Problems    Diagnosis Date Noted   GERD 10/11/2009   DIVERTICULOSIS OF COLON 10/11/2009   Atrial fibrillation, chronic ([place]) 03/01/2011  Long term (current) use of anticoagulants 03/01/2011   HTN (hypertension) 06/04/2011   Hyperlipidemia 12/20/2011   SBO (small bowel obstruction) ([place]) 07/08/2013   Aortic valve regurgitation 10/16/2018   SIRS (systemic inflammatory response syndrome) ([place]) 02/09/2019   Osteomyelitis ([place]) 02/09/2019   Acute blood loss anemia 02/09/2019   Patellar tendon rupture, left, initial encounter 02/10/2019   Bipolar 1 disorder ([place]) 02/14/2019   Dementia without behavioral disturbance ([place]) 02/14/2019   Postoperative pain    Rupture of left patellar tendon 02/15/2019   Hypoalbuminemia    Leukocytosis    Hyperglycemia    Persistent atrial fibrillation with RVR (HCC)    Acute on chronic anemia    Sepsis, Gram negative ([place]) 03/18/2019   AKI (acute kidney injury) ([place]) 05/05/2019   Transient hypotension 05/05/2019   Protein-calorie malnutrition, severe 05/18/2019   Chronic diastolic CHF (congestive heart failure) ([place]) 05/22/2019   Gout 05/22/2019   Sepsis ([place]) 05/22/2019   Bacteremia due to Enterococcus 05/24/2019   Urinary tract infection 05/24/2019   Resolved Ambulatory Problems    Diagnosis Date Noted   Encounter for therapeutic drug monitoring 12/02/2013   Arthritis, septic, knee ([place]) 02/09/2019   BPH (benign prostatic hyperplasia)    Severe sepsis ([place]) 21/30/8657   Acute metabolic encephalopathy 84/69/6295   Past Medical History:  Diagnosis Date   BPH  (benign prostatic hypertrophy)    Chronic anticoagulation    Chronic atrial fibrillation (HCC)    Diastolic dysfunction October 2010   Hx SBO    Hypertension    Small bowel obstruction ([place])    SOCIAL HX:  Social History   Tobacco Use   Smoking status: Never   Smokeless tobacco: Never  Substance Use Topics   Alcohol use: No    Alcohol/week: 0.0 standard drinks     ALLERGIES: No Known Allergies   PERTINENT MEDICATIONS:  Outpatient Encounter Medications as of 03/07/2022  Medication Sig   acetaminophen (TYLENOL) 325 MG tablet Take 2 tablets (650 mg total) by mouth every 6 (six) hours as needed for mild pain, fever or headache. (Patient not taking: Reported on 06/03/2019)   allopurinol (ZYLOPRIM) 100 MG tablet Take 1 tablet (100 mg total) by mouth daily. (Patient taking differently: Take 100 mg by mouth at bedtime. )   cephALEXin (KEFLEX) 500 MG capsule Take 1 capsule (500 mg total) by mouth 4 (four) times daily.   Cholecalciferol (VITAMIN D-3) 25 MCG (1000 UT) CAPS Take 1,000 Units by mouth 2 (two) times a day.   colchicine 0.6 MG tablet Take 1 tablet (0.6 mg total) by mouth 2 (two) times daily. (Patient taking differently: Take 0.6 mg by mouth daily. )   cyanocobalamin (,VITAMIN B-12,) 1000 MCG/ML injection Inject 1,000 mcg into the muscle every 30 (thirty) days.   diltiazem (CARDIZEM CD) 120 MG 24 hr capsule Take 1 capsule (120 mg total) by mouth daily.   docusate sodium (COLACE) 100 MG capsule Take 1 capsule (100 mg total) by mouth 2 (two) times daily. (Patient taking differently: Take 100 mg by mouth at bedtime. )   donepezil (ARICEPT) 10 MG tablet Take 1 tablet (10 mg total) by mouth daily. (Patient taking differently: Take 10 mg by mouth at bedtime. )   feeding supplement, ENSURE ENLIVE, (ENSURE ENLIVE) LIQD Take 237 mLs by mouth 2 (two) times daily between meals. (Patient taking differently: Take 118.5-237 mLs by mouth 2 (two) times daily as needed (for supplementation). CHOCOLATE)    finasteride (PROSCAR) 5 MG tablet Take 1 tablet (5 mg  total) by mouth every morning.   lithium carbonate 150 MG capsule TAKE 1 CAPSULE BY MOUTH EVERY NIGHT AT BEDTIME. APPOINTMENT NEEDED   Multiple Vitamins-Minerals (ICAPS AREDS 2 PO) Take 1 capsule by mouth daily.    omega-3 fish oil (MAXEPA) 1000 MG CAPS capsule Take 1,000 mg by mouth daily with breakfast.    warfarin (COUMADIN) 2.5 MG tablet Take 1 tablet (2.5 mg total) by mouth daily. (Patient taking differently: Take 2.5 mg by mouth daily at 6 PM. )   No facility-administered encounter medications on file as of 03/07/2022.   Thank you for the opportunity to participate in the care of Mr. Williamson.  The palliative care team will continue to follow. Please call our office at [phone] if we can be of additional assistance.   Hollace Kinnier, DO  COVID-19 PATIENT SCREENING TOOL Asked and negative response unless otherwise noted:  Have you had symptoms of covid, tested positive or been in contact with someone with symptoms/positive test in the past 5-10 days? no

## 2022-03-08 ENCOUNTER — Other Ambulatory Visit: Payer: Medicare Other | Admitting: *Deleted

## 2022-03-08 ENCOUNTER — Encounter: Payer: Self-pay | Admitting: Internal Medicine

## 2022-03-08 DIAGNOSIS — R739 Hyperglycemia, unspecified: Secondary | ICD-10-CM | POA: Diagnosis not present

## 2022-03-08 DIAGNOSIS — Z515 Encounter for palliative care: Secondary | ICD-10-CM

## 2022-03-08 DIAGNOSIS — E785 Hyperlipidemia, unspecified: Secondary | ICD-10-CM | POA: Diagnosis not present

## 2022-03-08 DIAGNOSIS — D649 Anemia, unspecified: Secondary | ICD-10-CM | POA: Diagnosis not present

## 2022-03-08 DIAGNOSIS — I5032 Chronic diastolic (congestive) heart failure: Secondary | ICD-10-CM | POA: Diagnosis not present

## 2022-03-11 DIAGNOSIS — Z20822 Contact with and (suspected) exposure to covid-19: Secondary | ICD-10-CM | POA: Diagnosis not present

## 2022-03-11 NOTE — Progress Notes (Signed)
[facility] PALLIATIVE CARE RN NOTE ? ?PATIENT NAME: Russell Dillon ?DOB: [birth year] ?MRN: [id number] ? ?PRIMARY CARE PROVIDER: Ginger Organ., MD ? ?RESPONSIBLE PARTY: Russell Dillon (wife) ?Acct ID - Guarantor Home Phone Work Phone Relationship Acct Type  ?[id number] Russell Dillon,* [phone]  Self P/F  ?   [address] [place], [place], [place] [address]  ? ?Covid-19 Pre-screening Negative ? ?RN visit made as requested by patient's PCP Dr. Brigitte Pulse to draw CBC, CMET, Lipid Panel, TSH, ApoB, Uric Acid, Vitamin B12 and Hgb A1c. Labs drawn in R lower forearm and taken to [facility] on [address] for processing. Lab results to be discussed via telehealth visit with Dr. Brigitte Pulse on 03/12/22. Hospice to be discussed with PCP at this time as well. ? ?Assessment: Patient reports not feeling well today. He is more sleepy/lethargic. His intake is declining. Apparent wt loss and muscle wasting noted. Denies pain or shortness of breath. Intermittent confusion. Required assistance from wife and in home sitter during lab draw due to some agitation. Total care with all ADLs. Stage 2 pressure injury noted to mid upper back. Incontinent of both bowel and bladder. U/A was also requested however due to incontinence I would have to perform in and out cath. Wife did not feel this was necessary at this time and does not currently suspect a UTI. Palliative care will continue to follow.  ? ? ?(Duration of visit and documentation 90 minutes) ? ? ?Daryl Eastern, RN BSN ? ?

## 2022-03-12 DIAGNOSIS — F0390 Unspecified dementia without behavioral disturbance: Secondary | ICD-10-CM | POA: Diagnosis not present

## 2022-03-12 DIAGNOSIS — R2689 Other abnormalities of gait and mobility: Secondary | ICD-10-CM | POA: Diagnosis not present

## 2022-03-12 DIAGNOSIS — R7301 Impaired fasting glucose: Secondary | ICD-10-CM | POA: Diagnosis not present

## 2022-03-12 DIAGNOSIS — Z7189 Other specified counseling: Secondary | ICD-10-CM | POA: Diagnosis not present

## 2022-03-12 DIAGNOSIS — Z1339 Encounter for screening examination for other mental health and behavioral disorders: Secondary | ICD-10-CM | POA: Diagnosis not present

## 2022-03-12 DIAGNOSIS — I1310 Hypertensive heart and chronic kidney disease without heart failure, with stage 1 through stage 4 chronic kidney disease, or unspecified chronic kidney disease: Secondary | ICD-10-CM | POA: Diagnosis not present

## 2022-03-12 DIAGNOSIS — G309 Alzheimer's disease, unspecified: Secondary | ICD-10-CM | POA: Diagnosis not present

## 2022-03-12 DIAGNOSIS — I480 Paroxysmal atrial fibrillation: Secondary | ICD-10-CM | POA: Diagnosis not present

## 2022-03-12 DIAGNOSIS — F319 Bipolar disorder, unspecified: Secondary | ICD-10-CM | POA: Diagnosis not present

## 2022-03-12 DIAGNOSIS — Z0000 Encounter for general adult medical examination without abnormal findings: Secondary | ICD-10-CM | POA: Diagnosis not present

## 2022-03-12 DIAGNOSIS — E785 Hyperlipidemia, unspecified: Secondary | ICD-10-CM | POA: Diagnosis not present

## 2022-03-12 DIAGNOSIS — Z1331 Encounter for screening for depression: Secondary | ICD-10-CM | POA: Diagnosis not present

## 2022-03-12 DIAGNOSIS — M109 Gout, unspecified: Secondary | ICD-10-CM | POA: Diagnosis not present

## 2022-03-12 DIAGNOSIS — N1831 Chronic kidney disease, stage 3a: Secondary | ICD-10-CM | POA: Diagnosis not present

## 2022-03-15 DIAGNOSIS — D649 Anemia, unspecified: Secondary | ICD-10-CM | POA: Diagnosis not present

## 2022-03-15 DIAGNOSIS — M109 Gout, unspecified: Secondary | ICD-10-CM | POA: Diagnosis not present

## 2022-03-15 DIAGNOSIS — K219 Gastro-esophageal reflux disease without esophagitis: Secondary | ICD-10-CM | POA: Diagnosis not present

## 2022-03-15 DIAGNOSIS — I509 Heart failure, unspecified: Secondary | ICD-10-CM | POA: Diagnosis not present

## 2022-03-15 DIAGNOSIS — N400 Enlarged prostate without lower urinary tract symptoms: Secondary | ICD-10-CM | POA: Diagnosis not present

## 2022-03-15 DIAGNOSIS — I110 Hypertensive heart disease with heart failure: Secondary | ICD-10-CM | POA: Diagnosis not present

## 2022-03-15 DIAGNOSIS — F319 Bipolar disorder, unspecified: Secondary | ICD-10-CM | POA: Diagnosis not present

## 2022-03-15 DIAGNOSIS — E785 Hyperlipidemia, unspecified: Secondary | ICD-10-CM | POA: Diagnosis not present

## 2022-03-15 DIAGNOSIS — Z8719 Personal history of other diseases of the digestive system: Secondary | ICD-10-CM | POA: Diagnosis not present

## 2022-03-15 DIAGNOSIS — I679 Cerebrovascular disease, unspecified: Secondary | ICD-10-CM | POA: Diagnosis not present

## 2022-03-15 DIAGNOSIS — I4891 Unspecified atrial fibrillation: Secondary | ICD-10-CM | POA: Diagnosis not present

## 2022-03-15 DIAGNOSIS — F0150 Vascular dementia without behavioral disturbance: Secondary | ICD-10-CM | POA: Diagnosis not present

## 2022-03-18 DIAGNOSIS — F0150 Vascular dementia without behavioral disturbance: Secondary | ICD-10-CM | POA: Diagnosis not present

## 2022-03-18 DIAGNOSIS — I110 Hypertensive heart disease with heart failure: Secondary | ICD-10-CM | POA: Diagnosis not present

## 2022-03-18 DIAGNOSIS — E785 Hyperlipidemia, unspecified: Secondary | ICD-10-CM | POA: Diagnosis not present

## 2022-03-18 DIAGNOSIS — I4891 Unspecified atrial fibrillation: Secondary | ICD-10-CM | POA: Diagnosis not present

## 2022-03-18 DIAGNOSIS — I679 Cerebrovascular disease, unspecified: Secondary | ICD-10-CM | POA: Diagnosis not present

## 2022-03-18 DIAGNOSIS — Z20822 Contact with and (suspected) exposure to covid-19: Secondary | ICD-10-CM | POA: Diagnosis not present

## 2022-03-18 DIAGNOSIS — I509 Heart failure, unspecified: Secondary | ICD-10-CM | POA: Diagnosis not present

## 2022-03-19 DIAGNOSIS — E785 Hyperlipidemia, unspecified: Secondary | ICD-10-CM | POA: Diagnosis not present

## 2022-03-19 DIAGNOSIS — I4891 Unspecified atrial fibrillation: Secondary | ICD-10-CM | POA: Diagnosis not present

## 2022-03-19 DIAGNOSIS — I509 Heart failure, unspecified: Secondary | ICD-10-CM | POA: Diagnosis not present

## 2022-03-19 DIAGNOSIS — I110 Hypertensive heart disease with heart failure: Secondary | ICD-10-CM | POA: Diagnosis not present

## 2022-03-19 DIAGNOSIS — F0150 Vascular dementia without behavioral disturbance: Secondary | ICD-10-CM | POA: Diagnosis not present

## 2022-03-19 DIAGNOSIS — I679 Cerebrovascular disease, unspecified: Secondary | ICD-10-CM | POA: Diagnosis not present

## 2022-04-11 DEATH — deceased
